# Patient Record
Sex: Male | Born: 1945 | Race: White | Hispanic: No | Smoking: Former smoker
Health system: Southern US, Community
[De-identification: ages and names within clinical notes are randomized; demographics above are authoritative.]

## PROBLEM LIST (undated history)

## (undated) DIAGNOSIS — Z951 Presence of aortocoronary bypass graft: Secondary | ICD-10-CM

## (undated) DIAGNOSIS — M1990 Unspecified osteoarthritis, unspecified site: Secondary | ICD-10-CM

## (undated) DIAGNOSIS — R350 Frequency of micturition: Secondary | ICD-10-CM

## (undated) DIAGNOSIS — I509 Heart failure, unspecified: Secondary | ICD-10-CM

## (undated) DIAGNOSIS — I519 Heart disease, unspecified: Secondary | ICD-10-CM

## (undated) DIAGNOSIS — I4891 Unspecified atrial fibrillation: Secondary | ICD-10-CM

## (undated) DIAGNOSIS — Z9289 Personal history of other medical treatment: Secondary | ICD-10-CM

## (undated) DIAGNOSIS — M48061 Spinal stenosis, lumbar region without neurogenic claudication: Secondary | ICD-10-CM

## (undated) DIAGNOSIS — G4730 Sleep apnea, unspecified: Secondary | ICD-10-CM

## (undated) DIAGNOSIS — E7800 Pure hypercholesterolemia, unspecified: Secondary | ICD-10-CM

## (undated) DIAGNOSIS — N400 Enlarged prostate without lower urinary tract symptoms: Secondary | ICD-10-CM

## (undated) DIAGNOSIS — I2510 Atherosclerotic heart disease of native coronary artery without angina pectoris: Secondary | ICD-10-CM

## (undated) DIAGNOSIS — Z95810 Presence of automatic (implantable) cardiac defibrillator: Secondary | ICD-10-CM

## (undated) DIAGNOSIS — R351 Nocturia: Secondary | ICD-10-CM

## (undated) DIAGNOSIS — I255 Ischemic cardiomyopathy: Secondary | ICD-10-CM

## (undated) DIAGNOSIS — I4510 Unspecified right bundle-branch block: Secondary | ICD-10-CM

## (undated) DIAGNOSIS — M5126 Other intervertebral disc displacement, lumbar region: Secondary | ICD-10-CM

## (undated) DIAGNOSIS — I639 Cerebral infarction, unspecified: Secondary | ICD-10-CM

## (undated) DIAGNOSIS — I10 Essential (primary) hypertension: Secondary | ICD-10-CM

## (undated) DIAGNOSIS — R011 Cardiac murmur, unspecified: Secondary | ICD-10-CM

## (undated) HISTORY — DX: Presence of aortocoronary bypass graft: Z95.1

## (undated) HISTORY — DX: Personal history of other medical treatment: Z92.89

## (undated) HISTORY — DX: Heart failure, unspecified: I50.9

## (undated) HISTORY — DX: Atherosclerotic heart disease of native coronary artery without angina pectoris: I25.10

## (undated) HISTORY — PX: OTHER SURGICAL HISTORY: SHX169

## (undated) HISTORY — DX: Benign prostatic hyperplasia without lower urinary tract symptoms: N40.0

## (undated) HISTORY — DX: Unspecified atrial fibrillation: I48.91

## (undated) HISTORY — DX: Ischemic cardiomyopathy: I25.5

## (undated) HISTORY — DX: Cardiac murmur, unspecified: R01.1

## (undated) HISTORY — DX: Heart disease, unspecified: I51.9

---

## 2010-09-28 DIAGNOSIS — Z9289 Personal history of other medical treatment: Secondary | ICD-10-CM

## 2010-09-28 HISTORY — DX: Personal history of other medical treatment: Z92.89

## 2010-10-09 DIAGNOSIS — R0602 Shortness of breath: Secondary | ICD-10-CM

## 2010-10-27 ENCOUNTER — Ambulatory Visit: Payer: Self-pay | Admitting: Cardiology

## 2010-11-25 ENCOUNTER — Ambulatory Visit: Payer: Self-pay | Admitting: Cardiology

## 2011-01-28 ENCOUNTER — Ambulatory Visit (HOSPITAL_COMMUNITY)
Admission: RE | Admit: 2011-01-28 | Discharge: 2011-01-28 | Disposition: A | Discharge status: Home / Self Care | Payer: Medicare Other | Source: Ambulatory Visit | Attending: Cardiovascular Disease | Admitting: Cardiovascular Disease

## 2011-01-28 ENCOUNTER — Other Ambulatory Visit (HOSPITAL_COMMUNITY): Payer: Self-pay | Admitting: Cardiovascular Disease

## 2011-01-28 DIAGNOSIS — Z01810 Encounter for preprocedural cardiovascular examination: Secondary | ICD-10-CM

## 2011-01-28 DIAGNOSIS — Z01818 Encounter for other preprocedural examination: Secondary | ICD-10-CM | POA: Insufficient documentation

## 2011-01-28 HISTORY — PX: CARDIAC CATHETERIZATION: SHX172

## 2011-02-04 ENCOUNTER — Ambulatory Visit (HOSPITAL_COMMUNITY)
Admission: RE | Admit: 2011-02-04 | Discharge: 2011-02-04 | Disposition: A | Discharge status: Home / Self Care | Payer: Medicare Other | Source: Ambulatory Visit | Attending: Cardiovascular Disease | Admitting: Cardiovascular Disease

## 2011-02-04 DIAGNOSIS — I2510 Atherosclerotic heart disease of native coronary artery without angina pectoris: Secondary | ICD-10-CM | POA: Insufficient documentation

## 2011-02-04 DIAGNOSIS — I4510 Unspecified right bundle-branch block: Secondary | ICD-10-CM | POA: Insufficient documentation

## 2011-02-04 DIAGNOSIS — I509 Heart failure, unspecified: Secondary | ICD-10-CM | POA: Insufficient documentation

## 2011-02-04 DIAGNOSIS — I2589 Other forms of chronic ischemic heart disease: Secondary | ICD-10-CM | POA: Insufficient documentation

## 2011-02-04 DIAGNOSIS — G4733 Obstructive sleep apnea (adult) (pediatric): Secondary | ICD-10-CM | POA: Insufficient documentation

## 2011-02-04 DIAGNOSIS — R9439 Abnormal result of other cardiovascular function study: Secondary | ICD-10-CM | POA: Insufficient documentation

## 2011-02-04 LAB — POCT I-STAT 3, VENOUS BLOOD GAS (G3P V)
Bicarbonate: 24.3 mEq/L — ABNORMAL HIGH (ref 20.0–24.0)
TCO2: 26 mmol/L (ref 0–100)
pCO2, Ven: 45.5 mmHg (ref 45.0–50.0)
pH, Ven: 7.336 — ABNORMAL HIGH (ref 7.250–7.300)

## 2011-02-04 LAB — POCT I-STAT 3, ART BLOOD GAS (G3+)
Acid-Base Excess: 1 mmol/L (ref 0.0–2.0)
Bicarbonate: 26.1 mEq/L — ABNORMAL HIGH (ref 20.0–24.0)
O2 Saturation: 96 %
pO2, Arterial: 84 mmHg (ref 80.0–100.0)

## 2011-02-09 NOTE — Cardiovascular Report (Signed)
NAMEMAYFIELD, SCHOENE NO.:  [id number]  MEDICAL RECORD NO.:  [id number]  LOCATION:  [facility]                         FACILITY:  [facility]  PHYSICIAN:  Nicki Guadalajara, M.D.     DATE OF BIRTH:  1946-04-28  DATE OF PROCEDURE:  02/04/2011 DATE OF DISCHARGE:  02/04/2011                           CARDIAC CATHETERIZATION   PROCEDURE:  Right and left heart cardiac catheterization.  OPERATOR:  Nicki Guadalajara, MD  INDICATIONS:  Mr. Jamaurion Slemmer is a 65 year old gentleman who has a history of a cardiomyopathy which has been presumed to be nonischemic in etiology.  He has a history of congestive heart failure in April 2012, history of right bundle-branch block.  He also was diagnosed as having obstructive sleep apnea and has initiated CPAP therapy.  A nuclear perfusion study had shown a dilated left ventricle with mild defect in the apical septal, apical inferior and apical lateral region without significant ischemia.  Ejection fraction was 35%.  The patient has been on medical therapy with ACE inhibition, as well as carvedilol and recently spironolactone was added to his medical regimen.  He is now referred for definitive right and left heart cardiac catheterization by Dr. Alanda Amass.  PROCEDURE IN DETAIL:  The patient was prepped and draped in the usual fashion.  His right femoral artery and right femoral vein were punctured anteriorly and a 5-French arterial sheath and 7-French venous sheaths were inserted without difficulty.  Swan-Ganz catheter was inserted into the venous sheath and advanced to the RA, RV, PA ,and PC positions under fluoroscopic guidance with hemodynamic monitoring.  O2 saturation was obtained in the PA.  A pigtail catheter was advanced to the central aorta and simultaneous AO and PA pressures were recorded.  O2 saturation was obtained in the aorta for Fick cardiac output determination. Cardiac output was also determined by the thermodilution method.   The pigtail catheter was advanced into the LV and left ventriculography was performed.  LV PC pressures were recorded.  An LV AO pullback was performed and distal aortography was done to evaluate aortoiliac disease.  Attention was then directed at the coronary arteries.  A 5- [place] FL-4 diagnostic catheter was inserted for selective angiography in the left coronary system.  IC nitroglycerin was administered selectively into the left coronary system to further evaluate the LAD stenosis.  A 5-French JR-4 catheter was used for selective angiography into the right coronary artery.  The patient tolerated the procedure well.  Hemostasis was obtained by direct manual pressure.  He returned to his room in satisfactory condition.  HEMODYNAMIC DATA:  Right atrial mean pressure 8, A-wave 12.  Right ventricular pressure 48/8.  Pulmonary pressure 48/22, mean pulmonary capillary wedge pressure 16.  Central aortic pressure 159/73.  Left ventricular pressure 159/13, post A-wave 24.  O2 saturation 96%, PA saturation 71%.  Cardiac output by the thermodilution method was 5.9 and by the Fick method was 6.6 L/min. Cardiac index was 2.5 and 2.9 L/min/m2 respectively.  Left ventriculography:  RAO ventriculography revealed dilated left ventricle with severe global hypocontractility.  Ejection fraction is in the 30-35% range.  Distal aortography did not demonstrate any renal artery stenosis.  There was mild irregularity  of the aorta without significant stenoses.  Fluoroscopy of the coronary arteries revealed coronary calcification.  The left main coronary artery bifurcated into the LAD and left circumflex system.  The LAD had evidence for coronary calcification.  After the first diagonal vessel, the LAD was diffusely narrowed of approximately 50-60% diffusely, although the RAO cranial view suggested perhaps this was tighter.  In the mid distal LAD, there was focal 80% stenosis.  These lesions did  improve slightly following IC nitroglycerin administration.  The circumflex vessel had smooth ostial narrowing of 20-30%.  There was mild luminal irregularity in the mid AV groove circumflex of 20%.  The right coronary artery was a large dominant vessel that was tortuous. There was up to 70% smooth stenosis after the proximal bend before the secondary proximal bend.  There was 99% stenosis at the ostium of a small anterior RV marginal branch.  There was 30% mid RCA stenosis followed by 50% stenosis before the acute margin and then 50-60% stenosis before the PDA takeoff in the distal RCA.  There was 70% stenosis in the proximal portion of the PLA vessel.  IMPRESSION: 1. Ischemic cardiomyopathy with an ejection fraction of 30-35% with     significant global left ventricular dysfunction. 2. Multivessel coronary artery disease with evidence for coronary     calcification, diffuse 50-60% left anterior descending stenosis in     the mid segment with 80% focal mid distal stenosis, improved     slightly following intracoronary nitroglycerin administration; 20-     30% ostial circumflex stenosis with 20% narrowing in the proximal     circumflex; and diffusely diseased right coronary artery with     smooth 70% stenosis proximally, 99% stenosis in the anterior right     ventricular marginal branch, 30-50% mid right coronary artery     stenosis before the acute margin with 50-60% stenosis after the     crux, and prior to posterior descending artery takeoff with 70%     posterolateral stenosis.  RECOMMENDATIONS:  Mr. Arguijo angiographic findings will be reviewed with Dr. Alanda Amass.  Initially, increased medical therapy will be recommended with increased carvedilol, addition of nitrate therapy, and further titration of aldosterone blockade.  It is felt that the patient most likely has an ischemic cardiomyopathy rather than nonischemic in etiology.  The patient will return to Dr. Alanda Amass for  further additional management and consideration for possible intervention/revascularization.          ______________________________ Nicki Guadalajara, M.D.     TK/MEDQ  D:  02/04/2011  T:  02/04/2011  Job:  [id number]  cc:   Gerlene Burdock A. Alanda Amass, M.D. Fara Chute, MD  Electronically Signed by Nicki Guadalajara M.D. on 02/09/2011 11:19:02 AM

## 2011-06-02 ENCOUNTER — Emergency Department (HOSPITAL_COMMUNITY): Payer: Medicare Other

## 2011-06-02 ENCOUNTER — Other Ambulatory Visit: Payer: Self-pay

## 2011-06-02 ENCOUNTER — Encounter: Payer: Self-pay | Admitting: *Deleted

## 2011-06-02 ENCOUNTER — Inpatient Hospital Stay (HOSPITAL_COMMUNITY)
Admission: EM | Admit: 2011-06-02 | Discharge: 2011-06-03 | DRG: 312 | Disposition: A | Discharge status: Home / Self Care | Payer: Medicare Other | Attending: Internal Medicine | Admitting: Internal Medicine

## 2011-06-02 DIAGNOSIS — I255 Ischemic cardiomyopathy: Secondary | ICD-10-CM

## 2011-06-02 DIAGNOSIS — I951 Orthostatic hypotension: Principal | ICD-10-CM

## 2011-06-02 DIAGNOSIS — E785 Hyperlipidemia, unspecified: Secondary | ICD-10-CM | POA: Diagnosis present

## 2011-06-02 DIAGNOSIS — I2589 Other forms of chronic ischemic heart disease: Secondary | ICD-10-CM | POA: Diagnosis present

## 2011-06-02 DIAGNOSIS — R55 Syncope and collapse: Secondary | ICD-10-CM

## 2011-06-02 DIAGNOSIS — E119 Type 2 diabetes mellitus without complications: Secondary | ICD-10-CM | POA: Diagnosis present

## 2011-06-02 DIAGNOSIS — I10 Essential (primary) hypertension: Secondary | ICD-10-CM | POA: Diagnosis present

## 2011-06-02 DIAGNOSIS — G4733 Obstructive sleep apnea (adult) (pediatric): Secondary | ICD-10-CM | POA: Diagnosis present

## 2011-06-02 DIAGNOSIS — E860 Dehydration: Secondary | ICD-10-CM

## 2011-06-02 DIAGNOSIS — I2510 Atherosclerotic heart disease of native coronary artery without angina pectoris: Secondary | ICD-10-CM | POA: Diagnosis present

## 2011-06-02 DIAGNOSIS — IMO0001 Reserved for inherently not codable concepts without codable children: Secondary | ICD-10-CM | POA: Diagnosis present

## 2011-06-02 HISTORY — DX: Atherosclerotic heart disease of native coronary artery without angina pectoris: I25.10

## 2011-06-02 HISTORY — DX: Sleep apnea, unspecified: G47.30

## 2011-06-02 HISTORY — DX: Essential (primary) hypertension: I10

## 2011-06-02 HISTORY — DX: Pure hypercholesterolemia, unspecified: E78.00

## 2011-06-02 HISTORY — DX: Unspecified right bundle-branch block: I45.10

## 2011-06-02 LAB — BASIC METABOLIC PANEL
CO2: 26 mEq/L (ref 19–32)
Calcium: 8.8 mg/dL (ref 8.4–10.5)
Creatinine, Ser: 0.95 mg/dL (ref 0.50–1.35)
Glucose, Bld: 297 mg/dL — ABNORMAL HIGH (ref 70–99)

## 2011-06-02 LAB — URINALYSIS, ROUTINE W REFLEX MICROSCOPIC
Bilirubin Urine: NEGATIVE
Nitrite: NEGATIVE
Protein, ur: NEGATIVE mg/dL
Specific Gravity, Urine: 1.025 (ref 1.005–1.030)
Urobilinogen, UA: 0.2 mg/dL (ref 0.0–1.0)

## 2011-06-02 LAB — CBC
HCT: 35.3 % — ABNORMAL LOW (ref 39.0–52.0)
Hemoglobin: 12.8 g/dL — ABNORMAL LOW (ref 13.0–17.0)
MCH: 30.4 pg (ref 26.0–34.0)
MCV: 83.8 fL (ref 78.0–100.0)
RBC: 4.21 MIL/uL — ABNORMAL LOW (ref 4.22–5.81)

## 2011-06-02 LAB — POCT I-STAT TROPONIN I: Troponin i, poc: 0 ng/mL (ref 0.00–0.08)

## 2011-06-02 LAB — GLUCOSE, CAPILLARY

## 2011-06-02 MED ORDER — SODIUM CHLORIDE 0.9 % IV SOLN
INTRAVENOUS | Status: DC
  Administered 2011-06-02 – 2011-06-03 (×2): via INTRAVENOUS

## 2011-06-02 MED ORDER — CARVEDILOL 12.5 MG PO TABS
12.5000 mg | ORAL_TABLET | Freq: Every day | ORAL | Status: DC
  Filled 2011-06-02: qty 1

## 2011-06-02 MED ORDER — INSULIN ASPART 100 UNIT/ML ~~LOC~~ SOLN
0.0000 [IU] | Freq: Three times a day (TID) | SUBCUTANEOUS | Status: DC
  Administered 2011-06-03 (×2): 3 [IU] via SUBCUTANEOUS

## 2011-06-02 MED ORDER — CARVEDILOL 3.125 MG PO TABS: 6.2500 mg | ORAL_TABLET | Freq: Every day | ORAL | Status: DC

## 2011-06-02 MED ORDER — RAMIPRIL 2.5 MG PO CAPS: 5.0000 mg | ORAL_CAPSULE | Freq: Every day | ORAL | Status: DC

## 2011-06-02 MED ORDER — ONDANSETRON HCL 4 MG PO TABS: 4.0000 mg | ORAL_TABLET | Freq: Four times a day (QID) | ORAL | Status: DC | PRN

## 2011-06-02 MED ORDER — SODIUM CHLORIDE 0.9 % IJ SOLN
INTRAMUSCULAR | Status: AC
  Filled 2011-06-02: qty 3

## 2011-06-02 MED ORDER — ISOSORBIDE MONONITRATE ER 60 MG PO TB24
60.0000 mg | ORAL_TABLET | Freq: Every day | ORAL | Status: DC
  Administered 2011-06-02: 60 mg via ORAL
  Filled 2011-06-02: qty 1

## 2011-06-02 MED ORDER — DOXAZOSIN MESYLATE 2 MG PO TABS
4.0000 mg | ORAL_TABLET | Freq: Every day | ORAL | Status: DC
  Administered 2011-06-02: 4 mg via ORAL
  Filled 2011-06-02: qty 2

## 2011-06-02 MED ORDER — INSULIN ASPART 100 UNIT/ML ~~LOC~~ SOLN
0.0000 [IU] | Freq: Every day | SUBCUTANEOUS | Status: DC
  Administered 2011-06-02: 3 [IU] via SUBCUTANEOUS
  Filled 2011-06-02: qty 3

## 2011-06-02 MED ORDER — ACETAMINOPHEN 650 MG RE SUPP: 650.0000 mg | Freq: Four times a day (QID) | RECTAL | Status: DC | PRN

## 2011-06-02 MED ORDER — SPIRONOLACTONE 25 MG PO TABS
12.5000 mg | ORAL_TABLET | Freq: Two times a day (BID) | ORAL | Status: DC
Start: 2011-06-02 — End: 2011-06-03
  Administered 2011-06-02 – 2011-06-03 (×2): 12.5 mg via ORAL
  Filled 2011-06-02 (×2): qty 1

## 2011-06-02 MED ORDER — GLIPIZIDE ER 10 MG PO TB24
10.0000 mg | ORAL_TABLET | Freq: Every day | ORAL | Status: DC
  Administered 2011-06-03: 10 mg via ORAL
  Filled 2011-06-02: qty 1

## 2011-06-02 MED ORDER — ONDANSETRON HCL 4 MG/2ML IJ SOLN: 4.0000 mg | Freq: Four times a day (QID) | INTRAMUSCULAR | Status: DC | PRN

## 2011-06-02 MED ORDER — SODIUM CHLORIDE 0.9 % IJ SOLN
INTRAMUSCULAR | Status: AC
  Administered 2011-06-02: 20:00:00
  Filled 2011-06-02: qty 3

## 2011-06-02 MED ORDER — ROSUVASTATIN CALCIUM 20 MG PO TABS
40.0000 mg | ORAL_TABLET | Freq: Every day | ORAL | Status: DC
  Administered 2011-06-02: 40 mg via ORAL
  Filled 2011-06-02: qty 2

## 2011-06-02 MED ORDER — ZOLPIDEM TARTRATE 5 MG PO TABS
5.0000 mg | ORAL_TABLET | Freq: Every evening | ORAL | Status: DC | PRN
  Administered 2011-06-02: 5 mg via ORAL
  Filled 2011-06-02: qty 1

## 2011-06-02 MED ORDER — ACETAMINOPHEN 325 MG PO TABS
650.0000 mg | ORAL_TABLET | Freq: Four times a day (QID) | ORAL | Status: DC | PRN
  Administered 2011-06-02: 650 mg via ORAL
  Filled 2011-06-02: qty 2

## 2011-06-02 MED ORDER — HEPARIN SODIUM (PORCINE) 5000 UNIT/ML IJ SOLN
5000.0000 [IU] | Freq: Three times a day (TID) | INTRAMUSCULAR | Status: DC
  Administered 2011-06-02 – 2011-06-03 (×2): 5000 [IU] via SUBCUTANEOUS
  Filled 2011-06-02 (×2): qty 1

## 2011-06-02 MED ORDER — ASPIRIN EC 81 MG PO TBEC
81.0000 mg | DELAYED_RELEASE_TABLET | Freq: Every day | ORAL | Status: DC
  Administered 2011-06-03: 81 mg via ORAL
  Filled 2011-06-02: qty 1

## 2011-06-02 MED ORDER — SODIUM CHLORIDE 0.9 % IV BOLUS (SEPSIS): 250.0000 mL | Freq: Once | INTRAVENOUS | Status: DC

## 2011-06-02 NOTE — ED Notes (Signed)
Attempted to call report to nurse, nurse off floor and staff to inform nurse to call back when able to take report

## 2011-06-02 NOTE — H&P (Signed)
[name] [name] MRN: [id number] DOB/AGE: May 15, 1946 65 y.o. Primary Care Physician:[name],PAUL W, MD Admit date: 06/02/2011 Chief Complaint: Weakness with 10 second episode of syncope. HPI: This very pleasant 65 year old man, who has a history of ischemic cardiomyopathy with an  ejection fraction of 30-35% presents with a one-week history of feeling weak and somewhat lightheaded. He had gone to see his cardiologist today to noted that he had postural hypotension. He sent him for lab work. When he got to the lab, whilst he was standing up, he's felt dizzy and fell, losing consciousness. Fortunately, he lost consciousness for only a matter of 10-20 seconds. He was then fully awake. There is no history of seizure activity, abnormal color to his skin. When he was fully awake, he was also fully alert and orientated. He does not describe any chest pain, palpitations prior to the event nor has he had any recently in the last week or so. Interestingly, he does describe a trip to [place] for 5 days where he does admit that he walked for many hours a day.    Past medical history: 1. Type 2 diabetes mellitus, uncontrolled with most recent hemoglobin A1c 8.2%. 2. Ischemic cardiomyopathy, ejection fraction 30-35%. 3. Hyperlipidemia. 4. Hypertension. 5. Cerebrovascular disease with history of stroke. 6. History of right bundle branch block. 7. Sleep apnea.     History reviewed. No pertinent family history. Family history: Noncontributory.   Social history: He lives with his girlfriend. He does not smoke cigarettes. He occasionally drinks alcohol. He works as a Tree surgeon.  Allergies: No Known Allergies  Medications Prior to Admission  Medication Dose Route Frequency Provider Last Rate Last Dose  . 0.9 %  sodium chloride infusion   Intravenous Continuous Laray Anger, DO      . sodium chloride 0.9 % bolus 250 mL  250 mL Intravenous Once Laray Anger, DO       No current  outpatient prescriptions on file as of 06/02/2011.       WUJ:WJXBJ from the symptoms mentioned above,there are no other symptoms referable to all systems reviewed.  Physical Exam: Blood pressure 127/56, pulse 72, temperature 98 F (36.7 C), temperature source Oral, resp. rate 18, height 6\' 2"  (1.88 m), weight 104.327 kg (230 lb), SpO2 100.00%. He does actually looks systemically well at rest. He is alert and orientated. He does have orthostatic hypotension. Cardiovascular: Heart sounds are present and normal without murmurs. There is no gallop rhythm. Jugular venous pressure not raised. Respiratory: Lung fields are clear. Abdomen: Soft, nontender, no masses, no hepatosplenomegaly. Neurological: Alert and orientated without any focal neurological signs.    Basename 06/02/11 1412  WBC 3.5*  NEUTROABS --  HGB 12.8*  HCT 35.3*  MCV 83.8  PLT 160    Basename 06/02/11 1412  NA 132*  K 4.0  CL 95*  CO2 26  GLUCOSE 297*  BUN 26*  CREATININE 0.95  CALCIUM 8.8  MG --         Dg Chest 2 View  06/02/2011  *RADIOLOGY REPORT*  Clinical Data: Cough.  History of hypertension.  Former smoker.  CHEST - 2 VIEW 06/02/2011:  Comparison: Two-view chest x-ray 01/28/2011 [facility].  Findings: Cardiac silhouette normal in size.  Thoracic aorta atherosclerotic, unchanged.  Hilar and mediastinal contours otherwise unremarkable.  Lungs clear.  No pleural effusions. Degenerative changes involving the thoracic spine.  IMPRESSION: No acute cardiopulmonary disease.  Original Report Authenticated By: Arnell Sieving, M.D.   Ct Head  Wo Contrast  06/02/2011  *RADIOLOGY REPORT*  Clinical Data: Dizziness.  Loss of consciousness.  History of diabetes and hypertension.  CT HEAD WITHOUT CONTRAST 06/02/2011:  Technique:  Contiguous axial images were obtained from the base of the skull through the vertex without contrast.  Comparison: None.  Findings: Encephalomalacia in the inferior left cerebellar  hemisphere consistent with old stroke.  Old lacunar stroke in the inferior left basal ganglia.  Ventricular system normal in size and appearance for age.  No mass lesion.  No midline shift.  No acute hemorrhage or hematoma.  No extra-axial fluid collections.  No evidence of acute infarction.  No focal osseous abnormality involving the skull.  Visualized paranasal sinuses, mastoid air cells, and middle ear cavities well- aerated.  Mild bilateral carotid siphon and left vertebral artery atherosclerosis.  IMPRESSION:  1.  No acute intracranial abnormality. 2.  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 3.  Old lacunar stroke in the inferior left basal ganglia.  Original Report Authenticated By: Arnell Sieving, M.D.   Impression: 1. Dehydration/hypovolemia. 2. History of ischemic cardiomyopathy., Clinically not in heart failure. 3. Uncontrolled type 2 diabetes mellitus. 4. Hypertension currently with orthostatic hypotension. 5. Hyperlipidemia.     Plan: 1.  Admit to telemetry. 2. Gentle intravenous fluids. 3. Hold Lasix. Continue his prophylactic for the time being. Continue with all other medications. 4. Sliding scale of insulin for diabetic control. Continue with oral hypoglycemic agents except for metformin. 5. Serial cardiac enzymes and repeat ECG in the morning. 6. Cardiology consultation. I've spoken to Dr. [name], who will make sure he is seen tomorrow.      Taytem Ghattas C 06/02/2011, 5:08 PM

## 2011-06-02 NOTE — ED Notes (Signed)
Patient with LOC today, witnessed by daughter and had eased pt to floor, pt states that recently with weakness and dizziness, lost 11 lbs. In 2 weeks per PCP

## 2011-06-02 NOTE — ED Provider Notes (Signed)
History     CSN: [id number] Arrival date & time: 06/02/2011  1:50 PM     Chief Complaint  Patient presents with  . Loss of Consciousness    HPI Pt was seen at 1425.  Per pt and family, c/o gradual onset and worsening of persistent generalized weakness and fatigue x2 weeks.  Describes his symptoms as "just feeling tired."  Endorses poor PO intake.  Pt was eval by his Cards MD today for same, noted "low BP" in ofce.  Pt was sent to lab for blood work where pt had one brief syncopal episode while sitting in their waiting area.  Pt states he "just felt weak and lightheaded" before syncopal episode.  Family witnessed episode.  No reported seizure activity, no incont of bowel/bladder, no confusion after event.  Pt denies CP/palpitations, no SOB/cough, no back pain, no abd pain, no N/V/D, no fevers, no rash, no focal motor weakness, no tingling/numbness in extremities.    Past Medical History  Diagnosis Date  . Diabetes mellitus   . Coronary artery disease   . High cholesterol   . Hypertension   . Stroke, Wallenberg's syndrome   . Right bundle branch block   . Cardiomyopathy   . Sleep apnea     Past Surgical History  Procedure Date  . Cardiac catheterization     History  Substance Use Topics  . Smoking status: Former Games developer  . Smokeless tobacco: Not on file  . Alcohol Use: Yes     occ. use    Review of Systems ROS: Statement: All systems negative except as marked or noted in the HPI; Constitutional: Negative for fever and chills. +generalized weakness/fatigue ; Eyes: Negative for eye pain, redness and discharge. ; ; ENMT: Negative for ear pain, hoarseness, nasal congestion, sinus pressure and sore throat. ; ; Cardiovascular: Negative for chest pain, palpitations, diaphoresis, dyspnea and peripheral edema. ; ; Respiratory: Negative for cough, wheezing and stridor. ; ; Gastrointestinal: Negative for nausea, vomiting, diarrhea, abdominal pain, blood in stool, hematemesis, jaundice and  rectal bleeding. . ; ; Genitourinary: Negative for dysuria, flank pain and hematuria. ; ; Musculoskeletal: Negative for back pain and neck pain. Negative for swelling and trauma.; ; Skin: Negative for pruritus, rash, abrasions, blisters, bruising and skin lesion.; ; Neuro: Negative for headache, lightheadedness and neck stiffness. Negative for altered level of consciousness , altered mental status, extremity weakness, paresthesias, involuntary movement, seizure and +syncope.     Allergies  Review of patient's allergies indicates no known allergies.  Home Medications   Current Outpatient Rx  Name Route Sig Dispense Refill  . ASPIRIN EC 81 MG PO TBEC Oral Take 81 mg by mouth daily.      Marland Kitchen CARVEDILOL 6.25 MG PO TABS Oral Take 6.25-12.5 mg by mouth 2 (two) times daily. Take 2 tablets in the morning and one in the evening.     Marland Kitchen DOXAZOSIN MESYLATE 4 MG PO TABS Oral Take 4 mg by mouth at bedtime.      . FUROSEMIDE 20 MG PO TABS Oral Take 40 mg by mouth daily.      Marland Kitchen GLIPIZIDE ER 10 MG PO TB24 Oral Take 10 mg by mouth daily.      . IBUPROFEN 200 MG PO TABS Oral Take 400 mg by mouth every 6 (six) hours as needed. Pain     . ISOSORBIDE MONONITRATE ER 60 MG PO TB24 Oral Take 60 mg by mouth at bedtime.      Marland Kitchen METFORMIN HCL  ER (MOD) 500 MG PO TB24 Oral Take 500 mg by mouth 2 (two) times daily.      . ONE-A-DAY 50 PLUS PO Oral Take 1 tablet by mouth daily.      Marland Kitchen RAMIPRIL 5 MG PO CAPS Oral Take 5 mg by mouth at bedtime.      Marland Kitchen ROSUVASTATIN CALCIUM 40 MG PO TABS Oral Take 40 mg by mouth at bedtime.      . SPIRONOLACTONE 25 MG PO TABS Oral Take 12.5 mg by mouth 2 (two) times daily.        BP 87/50  Pulse 72  Temp(Src) 98.3 F (36.8 C) (Oral)  Resp 16  Ht 6\' 2"  (1.88 m)  Wt 230 lb (104.327 kg)  BMI 29.53 kg/m2  SpO2 99%  Physical Exam 1430: Physical examination:  Nursing notes reviewed; Vital signs and O2 SAT reviewed;  Constitutional: Well developed, Well nourished, Well hydrated, In no acute  distress; Head:  Normocephalic, atraumatic; Eyes: EOMI, PERRL, No scleral icterus; ENMT: Mouth and pharynx normal, Mucous membranes moist; Neck: Supple, Full range of motion, No lymphadenopathy; Cardiovascular: Regular rate and rhythm, No murmur, rub, or gallop; Respiratory: Breath sounds clear & equal bilaterally, No rales, rhonchi, wheezes, or rub, Normal respiratory effort/excursion; Chest: Nontender, Movement normal; Abdomen: Soft, Nontender, Nondistended, Normal bowel sounds; Extremities: Pulses normal, No tenderness, No edema, No calf edema or asymmetry.; Neuro: AA&Ox3, Major CN grossly intact. No facial droop, speech clear.  Equal grips, strength 5/5 equal bilat UE's and LE's.  No gross focal motor or sensory deficits in extremities.; Skin: Color normal, Warm, Dry, no rash, no petechiae.    ED Course  Procedures   1515:  T/C from [facility] Dr. Alanda Amass, states pt was in his ofc today, SBP 80's and 90's while sitting.  Pt's c/o in his ofc also was "weak and tired" for the past 2 weeks, no specific complaints.  He cut pt's BP meds doses today.  ED EKG today with new T-wave changes described to Cards MD, states pt has had those changes on previous EKG's.  He will fax over ofc info.  Pt will need tx for his hypotension with meds adjustment and judicious IVF with close monitoring given hx of cardiomyopathy.   Hospitalist please admit, he can consult prn.    MDM  MDM Reviewed: nursing note and vitals Reviewed previous: ECG Interpretation: ECG, labs, x-ray and CT scan    Date: 06/02/2011  Rate: 70  Rhythm: normal sinus rhythm  QRS Axis: left  Intervals: normal  ST/T Wave abnormalities: nonspecific ST/T changes  Conduction Disutrbances:right bundle branch block and left anterior fascicular block  Narrative Interpretation:   Old EKG Reviewed: changes noted; new flipped T-waves anterior-inferior leads compared to EKG from Cards ofc today.  Results for orders placed during the hospital encounter of  06/02/11  CBC      Component Value Range   WBC 3.5 (*) 4.0 - 10.5 (K/uL)   RBC 4.21 (*) 4.22 - 5.81 (MIL/uL)   Hemoglobin 12.8 (*) 13.0 - 17.0 (g/dL)   HCT 16.1 (*) 09.6 - 52.0 (%)   MCV 83.8  78.0 - 100.0 (fL)   MCH 30.4  26.0 - 34.0 (pg)   MCHC 36.3 (*) 30.0 - 36.0 (g/dL)   RDW 04.5  40.9 - 81.1 (%)   Platelets 160  150 - 400 (K/uL)  BASIC METABOLIC PANEL      Component Value Range   Sodium 132 (*) 135 - 145 (mEq/L)   Potassium  4.0  3.5 - 5.1 (mEq/L)   Chloride 95 (*) 96 - 112 (mEq/L)   CO2 26  19 - 32 (mEq/L)   Glucose, Bld 297 (*) 70 - 99 (mg/dL)   BUN 26 (*) 6 - 23 (mg/dL)   Creatinine, Ser 5.62  0.50 - 1.35 (mg/dL)   Calcium 8.8  8.4 - 13.0 (mg/dL)   GFR calc non Af Amer 84 (*) >90 (mL/min)   GFR calc Af Amer >90  >90 (mL/min)  POCT I-STAT TROPONIN I      Component Value Range   Troponin i, poc 0.00  0.00 - 0.08 (ng/mL)   Comment 3           D-DIMER, QUANTITATIVE      Component Value Range   D-Dimer, Quant 0.25  0.00 - 0.48 (ug/mL-FEU)   Dg Chest 2 View  06/02/2011  *RADIOLOGY REPORT*  Clinical Data: Cough.  History of hypertension.  Former smoker.  CHEST - 2 VIEW 06/02/2011:  Comparison: Two-view chest x-ray 01/28/2011 [facility].  Findings: Cardiac silhouette normal in size.  Thoracic aorta atherosclerotic, unchanged.  Hilar and mediastinal contours otherwise unremarkable.  Lungs clear.  No pleural effusions. Degenerative changes involving the thoracic spine.  IMPRESSION: No acute cardiopulmonary disease.  Original Report Authenticated By: Arnell Sieving, M.D.   Ct Head Wo Contrast  06/02/2011  *RADIOLOGY REPORT*  Clinical Data: Dizziness.  Loss of consciousness.  History of diabetes and hypertension.  CT HEAD WITHOUT CONTRAST 06/02/2011:  Technique:  Contiguous axial images were obtained from the base of the skull through the vertex without contrast.  Comparison: None.  Findings: Encephalomalacia in the inferior left cerebellar hemisphere consistent with  old stroke.  Old lacunar stroke in the inferior left basal ganglia.  Ventricular system normal in size and appearance for age.  No mass lesion.  No midline shift.  No acute hemorrhage or hematoma.  No extra-axial fluid collections.  No evidence of acute infarction.  No focal osseous abnormality involving the skull.  Visualized paranasal sinuses, mastoid air cells, and middle ear cavities well- aerated.  Mild bilateral carotid siphon and left vertebral artery atherosclerosis.  IMPRESSION:  1.  No acute intracranial abnormality. 2.  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 3.  Old lacunar stroke in the inferior left basal ganglia.  Original Report Authenticated By: Arnell Sieving, M.D.    4:07 PM:  Pt with elevated glu, but has hx of DM.  Not acidotic.  +orthostatic and symptomatic here in ED.  Will admit.  Dx testing d/w pt and family.  Questions answered.  Verb understanding, agreeable to admit.  1610:   T/C to [facility] Dr. Karilyn Cota, case discussed, including:  HPI, pertinent PM/SHx, VS/PE, dx testing, ED course and treatment.  Agreeable to admit.  Requests to obtain tele bed.     Tahjay Binion Allison Quarry, DO 06/04/11 [phone]

## 2011-06-02 NOTE — ED Notes (Signed)
Patient denies any CP or SOB, reported by [facility] with + orthostatics

## 2011-06-02 NOTE — ED Notes (Signed)
MD at bedside. 

## 2011-06-02 NOTE — ED Notes (Signed)
Patient states that he was seen by Dr. Jerilynn Birkenhead today, had an EKG done and lab work done as well

## 2011-06-03 DIAGNOSIS — R55 Syncope and collapse: Secondary | ICD-10-CM | POA: Diagnosis present

## 2011-06-03 LAB — URINE CULTURE: Culture: NO GROWTH

## 2011-06-03 LAB — CARDIAC PANEL(CRET KIN+CKTOT+MB+TROPI)
CK, MB: 2.3 ng/mL (ref 0.3–4.0)
Relative Index: 1.2 (ref 0.0–2.5)
Relative Index: 1.3 (ref 0.0–2.5)
Troponin I: <0.3 ng/mL (ref <0.30)

## 2011-06-03 LAB — GLUCOSE, CAPILLARY
Glucose-Capillary: 163 mg/dL — ABNORMAL HIGH (ref 70–99)
Glucose-Capillary: 197 mg/dL — ABNORMAL HIGH (ref 70–99)

## 2011-06-03 LAB — CBC
HCT: 31.5 % — ABNORMAL LOW (ref 39.0–52.0)
MCV: 83.6 fL (ref 78.0–100.0)
RBC: 3.77 MIL/uL — ABNORMAL LOW (ref 4.22–5.81)
RDW: 13.2 % (ref 11.5–15.5)
WBC: 3.7 10*3/uL — ABNORMAL LOW (ref 4.0–10.5)

## 2011-06-03 LAB — HEMOGLOBIN A1C
Hgb A1c MFr Bld: 8.5 % — ABNORMAL HIGH (ref <5.7)
Mean Plasma Glucose: 197 mg/dL — ABNORMAL HIGH (ref <117)

## 2011-06-03 LAB — COMPREHENSIVE METABOLIC PANEL
ALT: 22 U/L (ref 0–53)
AST: 22 U/L (ref 0–37)
Albumin: 3.2 g/dL — ABNORMAL LOW (ref 3.5–5.2)
Alkaline Phosphatase: 77 U/L (ref 39–117)
BUN: 22 mg/dL (ref 6–23)
Potassium: 3.4 mEq/L — ABNORMAL LOW (ref 3.5–5.1)
Sodium: 134 mEq/L — ABNORMAL LOW (ref 135–145)
Total Protein: 6.6 g/dL (ref 6.0–8.3)

## 2011-06-03 MED ORDER — ISOSORBIDE MONONITRATE ER 60 MG PO TB24: 30.0000 mg | ORAL_TABLET | Freq: Every day | ORAL | Status: DC

## 2011-06-03 MED ORDER — SITAGLIPTIN PHOSPHATE 100 MG PO TABS: 100.0000 mg | ORAL_TABLET | Freq: Every day | ORAL | Status: DC

## 2011-06-03 MED ORDER — RAMIPRIL 5 MG PO CAPS: 5.0000 mg | ORAL_CAPSULE | Freq: Every day | ORAL | Status: DC

## 2011-06-03 MED ORDER — FUROSEMIDE 20 MG PO TABS: 40.0000 mg | ORAL_TABLET | Freq: Every day | ORAL | Status: DC

## 2011-06-03 MED ORDER — CARVEDILOL 6.25 MG PO TABS: 6.2500 mg | ORAL_TABLET | Freq: Two times a day (BID) | ORAL | Status: DC

## 2011-06-03 NOTE — Discharge Summary (Signed)
Patient ID: SABAN HEINLEN MRN: [id number] DOB/AGE: 09/16/1945 65 y.o.  Admit date: 06/02/2011 Discharge date: 06/03/2011  Primary Care Physician:  [name], MD  Discharge Diagnoses:    Present on Admission:  .Dehydration .Ischemic cardiomyopathy .DM (diabetes mellitus) .HTN (hypertension) .Vasovagal syncope Orthostatic Hypotension    Current Discharge Medication List    START taking these medications   Details  sitaGLIPtin (JANUVIA) 100 MG tablet Take 1 tablet (100 mg total) by mouth daily. Qty: 30 tablet, Refills: 0      CONTINUE these medications which have CHANGED   Details  carvedilol (COREG) 6.25 MG tablet Take 1 tablet (6.25 mg total) by mouth 2 (two) times daily with a meal. Take 2 tablets in the morning and one in the evening.    furosemide (LASIX) 20 MG tablet Take 2 tablets (40 mg total) by mouth daily. Resume after 3 days    isosorbide mononitrate (IMDUR) 60 MG 24 hr tablet Take 0.5 tablets (30 mg total) by mouth at bedtime.    ramipril (ALTACE) 5 MG capsule Take 1 capsule (5 mg total) by mouth at bedtime. Resume after 3 days      CONTINUE these medications which have NOT CHANGED   Details  aspirin EC 81 MG tablet Take 81 mg by mouth daily.      doxazosin (CARDURA) 4 MG tablet Take 4 mg by mouth at bedtime.      glipiZIDE (GLUCOTROL XL) 10 MG 24 hr tablet Take 10 mg by mouth daily.      ibuprofen (ADVIL,MOTRIN) 200 MG tablet Take 400 mg by mouth every 6 (six) hours as needed. Pain     metFORMIN (GLUMETZA) 500 MG (MOD) 24 hr tablet Take 500 mg by mouth 2 (two) times daily.      Multiple Vitamins-Minerals (ONE-A-DAY 50 PLUS PO) Take 1 tablet by mouth daily.      rosuvastatin (CRESTOR) 40 MG tablet Take 40 mg by mouth at bedtime.      spironolactone (ALDACTONE) 25 MG tablet Take 12.5 mg by mouth 2 (two) times daily.          Disposition and Follow-up:Follow up with primary doctor and cardiologist as previously scheduled.  Consults:   Cardiology, Dr. [name]  Significant Diagnostic Studies:  No results found.  Brief H and P: For complete details please refer to admission H and P, but in brief This very pleasant 65 year old man, who has a history of ischemic cardiomyopathy with an ejection fraction of 30-35% presents with a one-week history of feeling weak and somewhat lightheaded. He had gone to see his cardiologist today to noted that he had postural hypotension. He sent him for lab work. When he got to the lab, whilst he was standing up, he's felt dizzy and fell, losing consciousness. Fortunately, he lost consciousness for only a matter of 10-20 seconds. He was then fully awake. There is no history of seizure activity, abnormal color to his skin. When he was fully awake, he was also fully alert and orientated.  He does not describe any chest pain, palpitations prior to the event nor has he had any recently in the last week or so.  Interestingly, he does describe a trip to [employer] for 5 days where he does admit that he walked for many hours a day.   Physical Exam on Discharge:  Filed Vitals:   06/02/11 1714 06/02/11 1755 06/02/11 2043 06/03/11 0523  BP: 142/57 145/71 110/66 120/64  Pulse: 68 63 72 72  Temp: 97.6  F (36.4 C) 97.7 F (36.5 C) 98 F (36.7 C) 97.8 F (36.6 C)  TempSrc: Oral Oral Oral   Resp: 20 20 20 20   Height:  6\' 2"  (1.88 m)    Weight:  103.057 kg (227 lb 3.2 oz)    SpO2: 99% 97% 97% 93%     Intake/Output Summary (Last 24 hours) at 06/03/11 1341 Last data filed at 06/03/11 0600  Gross per 24 hour  Intake   2410 ml  Output    400 ml  Net   2010 ml    General: Alert, awake, oriented x3, in no acute distress. HEENT: No bruits, no goiter. Heart: Regular rate and rhythm, without murmurs, rubs, gallops. Lungs: Clear to auscultation bilaterally. Abdomen: Soft, nontender, nondistended, positive bowel sounds. Extremities: No clubbing cyanosis or edema with positive pedal pulses. Neuro:  Grossly intact, nonfocal.  CBC:    Component Value Date/Time   WBC 3.7* 06/03/2011 0400   HGB 11.5* 06/03/2011 0400   HCT 31.5* 06/03/2011 0400   PLT 160 06/03/2011 0400   MCV 83.6 06/03/2011 0400    Basic Metabolic Panel:    Component Value Date/Time   NA 134* 06/03/2011 0400   K 3.4* 06/03/2011 0400   CL 100 06/03/2011 0400   CO2 25 06/03/2011 0400   BUN 22 06/03/2011 0400   CREATININE 0.74 06/03/2011 0400   GLUCOSE 172* 06/03/2011 0400   CALCIUM 8.7 06/03/2011 0400    Hospital Course:  Patient was found to be significantly orthostatic on admission.  He was hydrated with IV fluids and this resolved his symptoms.  He is no longer dizzy on standing and is asymptomatic.  His cardiac meds were adjusted and he was seen by Dr. [name] from cardiology.  His Coreg, Imdur and Aldactone doses were decreased by half.  His lasix and altace will be held for 3 days before resuming them. Patient had negative cardiac enzymes.  It was not felt that he needed any further inpatient cardiac work up.  Further work up, in terms of his coronary disease and options for revascularization would need to be discussed with his primary cardiologist as an outpatient.  Regarding his diabetes, we will start [place] for him.  He was advised to keep a log of his blood sugars and continue to follow with his nutritionist.  He will see his primary doctor next week for further adjustments.   Time spent on Discharge:  Signed: MEMON,JEHANZEB 06/03/2011, 1:41 PM

## 2011-06-03 NOTE — Progress Notes (Signed)
Subjective: Feels significantly improved.  No chest pain, shortness of breath.  He has ambulated to the bathroom and denies any dizziness. Wants to go home.  Objective:  Vital signs in last 24 hours:  Filed Vitals:   06/02/11 1714 06/02/11 1755 06/02/11 2043 06/03/11 0523  BP: 142/57 145/71 110/66 120/64  Pulse: 68 63 72 72  Temp: 97.6 F (36.4 C) 97.7 F (36.5 C) 98 F (36.7 C) 97.8 F (36.6 C)  TempSrc: Oral Oral Oral   Resp: 20 20 20 20   Height:  6\' 2"  (1.88 m)    Weight:  103.057 kg (227 lb 3.2 oz)    SpO2: 99% 97% 97% 93%    Intake/Output from previous day:   Intake/Output Summary (Last 24 hours) at 06/03/11 0956 Last data filed at 06/03/11 0600  Gross per 24 hour  Intake   2410 ml  Output    400 ml  Net   2010 ml    Physical Exam: General: Alert, awake, oriented x3, in no acute distress. HEENT: No bruits, no goiter. Moist mucous membranes, no scleral icterus, no conjunctival pallor. Heart: Regular rate and rhythm, without murmurs, rubs, gallops. Lungs: Clear to auscultation bilaterally. No wheezing, no rhonchi, no rales.  Abdomen: Soft, nontender, nondistended, positive bowel sounds. Extremities: No clubbing cyanosis or edema,  positive pedal pulses. Neuro: Grossly intact, nonfocal.    Lab Results:  Basic Metabolic Panel:    Component Value Date/Time   NA 134* 06/03/2011 0400   K 3.4* 06/03/2011 0400   CL 100 06/03/2011 0400   CO2 25 06/03/2011 0400   BUN 22 06/03/2011 0400   CREATININE 0.74 06/03/2011 0400   GLUCOSE 172* 06/03/2011 0400   CALCIUM 8.7 06/03/2011 0400   CBC:    Component Value Date/Time   WBC 3.7* 06/03/2011 0400   HGB 11.5* 06/03/2011 0400   HCT 31.5* 06/03/2011 0400   PLT 160 06/03/2011 0400   MCV 83.6 06/03/2011 0400      Lab 06/03/11 0400 06/02/11 1412  WBC 3.7* 3.5*  HGB 11.5* 12.8*  HCT 31.5* 35.3*  PLT 160 160  MCV 83.6 83.8  MCH 30.5 30.4  MCHC 36.5* 36.3*  RDW 13.2 13.2  LYMPHSABS -- --  MONOABS -- --  EOSABS -- --    BASOSABS -- --  BANDABS -- --    Lab 06/03/11 0400 06/02/11 1412  NA 134* 132*  K 3.4* 4.0  CL 100 95*  CO2 25 26  GLUCOSE 172* 297*  BUN 22 26*  CREATININE 0.74 0.95  CALCIUM 8.7 8.8  MG -- --   No results found for this basename: INR:5,PROTIME:5 in the last 168 hours Cardiac markers:  Lab 06/03/11 0745 06/03/11 0008  CKMB 2.3 2.3  TROPONINI <0.30 <0.30  MYOGLOBIN -- --    Lab 06/03/11 0400  POCBNP 95.8   No results found for this or any previous visit (from the past 240 hour(s)).  Studies/Results: Dg Chest 2 View  06/02/2011  *RADIOLOGY REPORT*  Clinical Data: Cough.  History of hypertension.  Former smoker.  CHEST - 2 VIEW 06/02/2011:  Comparison: Two-view chest x-ray 01/28/2011 [facility].  Findings: Cardiac silhouette normal in size.  Thoracic aorta atherosclerotic, unchanged.  Hilar and mediastinal contours otherwise unremarkable.  Lungs clear.  No pleural effusions. Degenerative changes involving the thoracic spine.  IMPRESSION: No acute cardiopulmonary disease.  Original Report Authenticated By: Arnell Sieving, M.D.   Ct Head Wo Contrast  06/02/2011  *RADIOLOGY REPORT*  Clinical Data: Dizziness.  Loss of consciousness.  History of diabetes and hypertension.  CT HEAD WITHOUT CONTRAST 06/02/2011:  Technique:  Contiguous axial images were obtained from the base of the skull through the vertex without contrast.  Comparison: None.  Findings: Encephalomalacia in the inferior left cerebellar hemisphere consistent with old stroke.  Old lacunar stroke in the inferior left basal ganglia.  Ventricular system normal in size and appearance for age.  No mass lesion.  No midline shift.  No acute hemorrhage or hematoma.  No extra-axial fluid collections.  No evidence of acute infarction.  No focal osseous abnormality involving the skull.  Visualized paranasal sinuses, mastoid air cells, and middle ear cavities well- aerated.  Mild bilateral carotid siphon and left vertebral  artery atherosclerosis.  IMPRESSION:  1.  No acute intracranial abnormality. 2.  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 3.  Old lacunar stroke in the inferior left basal ganglia.  Original Report Authenticated By: Arnell Sieving, M.D.    Medications: Scheduled Meds:   . aspirin EC  81 mg Oral Daily  . carvedilol  12.5 mg Oral Q breakfast  . carvedilol  6.25 mg Oral q1800  . doxazosin  4 mg Oral QHS  . glipiZIDE  10 mg Oral Q breakfast  . heparin  5,000 Units Subcutaneous Q8H  . insulin aspart  0-15 Units Subcutaneous TID WC  . insulin aspart  0-5 Units Subcutaneous QHS  . isosorbide mononitrate  60 mg Oral QHS  . ramipril  5 mg Oral QHS  . rosuvastatin  40 mg Oral QHS  . sodium chloride      . sodium chloride      . spironolactone  12.5 mg Oral BID  . DISCONTD: sodium chloride  250 mL Intravenous Once   Continuous Infusions:   . sodium chloride 75 mL/hr at 06/02/11 2336   PRN Meds:.acetaminophen, acetaminophen, ondansetron (ZOFRAN) IV, ondansetron, zolpidem  Assessment/Plan:  Principal Problem:  *Dehydration, resolved with IVF Active Problems:  Vasovagal Syncope:  Likely secondary to dehydration.  Resolved  Ischemic cardiomyopathy:  Patient saw cardiology yesterday, and his medicine dosages were reduced.  Inpatient consult has been requested.  We will await further recommendations.  DM (diabetes mellitus): counseled on the importance of compliance with meds, checking blood sugars and proper diet.  Will need to follow up with his primary doctor for further management.  HTN (hypertension)  Dispo:  Can likely discharge home later today if cleared by cardiology.   LOS: 1 day   MEMON,JEHANZEB 06/03/2011, 9:56 AM

## 2011-06-03 NOTE — Plan of Care (Signed)
Problem: Discharge Progression Outcomes Goal: Activity appropriate for discharge plan Outcome: Completed/Met Date Met:  06/03/11 Ambulated in halls without dizziness or weakness Goal: Other Discharge Outcomes/Goals Outcome: Completed/Met Date Met:  06/03/11 Pt discharged to home with family verbalized understanding of all instructions

## 2011-06-03 NOTE — Progress Notes (Signed)
UR Chart Review Completed  

## 2011-06-03 NOTE — Consult Note (Signed)
[facility]     CONSULTATION NOTE  Reason for Consult: Syncope  Requesting Physician: Dr. Karilyn Cota  HPI: This is a 65 y.o. male with a past medical history significant for fairly new onset cardiomyopathy, thought to be a mixed nonischemic and ischemic cardiomyopathy. He underwent nuclear stress test in our office which demonstrated no reversible ischemia. Subsequently there was concern for balanced ischemia due to his reduced ejection fraction of 25-30% and global hypokinesis, he was therefore referred for cardiac catheterization. Dr. Tresa Endo performed cardiac catheterization in August of 2012 which demonstrated moderately severe right coronary artery disease and to 70% proximal, 50-60% distal, 70% PLA, and 50-60% mid LAD stenoses. There is a distal 80% LAD lesion and noncritical disease of the circumflex. EF was 35% at that time. Was also thought to have sleep apnea and other issues and was not intervened on at that time. It is unclear whether his anatomy was suitable for bypass or not. Yesterday was seen in the office by Dr. Alanda Amass and has been having weakness and dizziness over the past few days. He recently took a trip to [employer] in [place] and was orthostatic positive in the office. Dr. Alanda Amass refer him for blood work and at the laboratory he had a syncopal episode for which he had some warning and subsequently was brought by EMS to [facility]. 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 Today's EKG showed resolution of those T-wave inversions. He was orthostatic upon admission and was hydrated overnight. He denies any chest pain, pressure, palpitations or other associated symptoms.  PMHx:  Past Medical History  Diagnosis Date  . Diabetes mellitus   . Coronary artery disease   . High cholesterol   . Hypertension   . Stroke, Wallenberg's syndrome   . Right bundle branch block   . Cardiomyopathy   .  Sleep apnea    Past Surgical History  Procedure Date  . Cardiac catheterization 01/2011    FAMHx: History reviewed. No pertinent family history.  SOCHx:  reports that he has quit smoking. He does not have any smokeless tobacco history on file. He reports that he drinks alcohol. He reports that he does not use illicit drugs.  ALLERGIES: No Known Allergies  ROS: A comprehensive review of systems was negative except for: Neurological: positive for syncope  HOME MEDICATIONS: Prescriptions prior to admission  Medication Sig Dispense Refill  . aspirin EC 81 MG tablet Take 81 mg by mouth daily.        . carvedilol (COREG) 6.25 MG tablet Take 6.25-12.5 mg by mouth 2 (two) times daily. Take 2 tablets in the morning and one in the evening.       Marland Kitchen doxazosin (CARDURA) 4 MG tablet Take 4 mg by mouth at bedtime.        . furosemide (LASIX) 20 MG tablet Take 40 mg by mouth daily.        Marland Kitchen glipiZIDE (GLUCOTROL XL) 10 MG 24 hr tablet Take 10 mg by mouth daily.        Marland Kitchen ibuprofen (ADVIL,MOTRIN) 200 MG tablet Take 400 mg by mouth every 6 (six) hours as needed. Pain       . isosorbide mononitrate (IMDUR) 60 MG 24 hr tablet Take 60 mg by mouth at bedtime.        . metFORMIN (GLUMETZA) 500 MG (MOD) 24 hr tablet Take 500 mg by mouth 2 (two) times daily.        Marland Kitchen  Multiple Vitamins-Minerals (ONE-A-DAY 50 PLUS PO) Take 1 tablet by mouth daily.        . ramipril (ALTACE) 5 MG capsule Take 5 mg by mouth at bedtime.        . rosuvastatin (CRESTOR) 40 MG tablet Take 40 mg by mouth at bedtime.        Marland Kitchen spironolactone (ALDACTONE) 25 MG tablet Take 12.5 mg by mouth 2 (two) times daily.          HOSPITAL MEDICATIONS: Prior to Admission:  Prescriptions prior to admission  Medication Sig Dispense Refill  . aspirin EC 81 MG tablet Take 81 mg by mouth daily.        . carvedilol (COREG) 6.25 MG tablet Take 6.25-12.5 mg by mouth 2 (two) times daily. Take 2 tablets in the morning and one in the evening.       Marland Kitchen  doxazosin (CARDURA) 4 MG tablet Take 4 mg by mouth at bedtime.        . furosemide (LASIX) 20 MG tablet Take 40 mg by mouth daily.        Marland Kitchen glipiZIDE (GLUCOTROL XL) 10 MG 24 hr tablet Take 10 mg by mouth daily.        Marland Kitchen ibuprofen (ADVIL,MOTRIN) 200 MG tablet Take 400 mg by mouth every 6 (six) hours as needed. Pain       . isosorbide mononitrate (IMDUR) 60 MG 24 hr tablet Take 60 mg by mouth at bedtime.        . metFORMIN (GLUMETZA) 500 MG (MOD) 24 hr tablet Take 500 mg by mouth 2 (two) times daily.        . Multiple Vitamins-Minerals (ONE-A-DAY 50 PLUS PO) Take 1 tablet by mouth daily.        . ramipril (ALTACE) 5 MG capsule Take 5 mg by mouth at bedtime.        . rosuvastatin (CRESTOR) 40 MG tablet Take 40 mg by mouth at bedtime.        Marland Kitchen spironolactone (ALDACTONE) 25 MG tablet Take 12.5 mg by mouth 2 (two) times daily.          VITALS: Blood pressure 120/64, pulse 72, temperature 97.8 F (36.6 [name]), temperature source Oral, resp. rate 20, height 6\' 2"  (1.88 m), weight 103.057 kg (227 lb 3.2 oz), SpO2 93.00%.  PHYSICAL EXAM: General appearance: alert and no distress Neck: no adenopathy, no carotid bruit, no JVD, supple, symmetrical, trachea midline and thyroid not enlarged, symmetric, no tenderness/mass/nodules Lungs: clear to auscultation bilaterally Heart: regular rate and rhythm, S1, S2 normal, no murmur, click, rub or gallop Abdomen: soft, non-tender; bowel sounds normal; no masses,  no organomegaly Extremities: extremities normal, atraumatic, no cyanosis or edema Pulses: 2+ and symmetric Skin: Skin color, texture, turgor normal. No rashes or lesions Neurologic: Grossly normal  LABS: Results for orders placed during the hospital encounter of 06/02/11 (from the past 48 hour(s))  CBC     Status: Abnormal   Collection Time   06/02/11  2:12 PM      Component Value Range Comment   WBC 3.5 (*) 4.0 - 10.5 (K/uL)    RBC 4.21 (*) 4.22 - 5.81 (MIL/uL)    Hemoglobin 12.8 (*) 13.0 - 17.0  (g/dL)    HCT 40.9 (*) 81.1 - 52.0 (%)    MCV 83.8  78.0 - 100.0 (fL)    MCH 30.4  26.0 - 34.0 (pg)    MCHC 36.3 (*) 30.0 - 36.0 (g/dL)    RDW  13.2  11.5 - 15.5 (%)    Platelets 160  150 - 400 (K/uL)   BASIC METABOLIC PANEL     Status: Abnormal   Collection Time   06/02/11  2:12 PM      Component Value Range Comment   Sodium 132 (*) 135 - 145 (mEq/L)    Potassium 4.0  3.5 - 5.1 (mEq/L)    Chloride 95 (*) 96 - 112 (mEq/L)    CO2 26  19 - 32 (mEq/L)    Glucose, Bld 297 (*) 70 - 99 (mg/dL)    BUN 26 (*) 6 - 23 (mg/dL)    Creatinine, Ser 1.61  0.50 - 1.35 (mg/dL)    Calcium 8.8  8.4 - 10.5 (mg/dL)    GFR calc non Af Amer 84 (*) >90 (mL/min)    GFR calc Af Amer >90  >90 (mL/min)   D-DIMER, QUANTITATIVE     Status: Normal   Collection Time   06/02/11  2:12 PM      Component Value Range Comment   D-Dimer, Quant 0.25  0.00 - 0.48 (ug/mL-FEU)   POCT I-STAT TROPONIN I     Status: Normal   Collection Time   06/02/11  2:31 PM      Component Value Range Comment   Troponin i, poc 0.00  0.00 - 0.08 (ng/mL)    Comment 3            URINALYSIS, ROUTINE W REFLEX MICROSCOPIC     Status: Abnormal   Collection Time   06/02/11  5:26 PM      Component Value Range Comment   Color, Urine YELLOW  YELLOW     APPearance CLEAR  CLEAR     Specific Gravity, Urine 1.025  1.005 - 1.030     pH 5.5  5.0 - 8.0     Glucose, UA 500 (*) NEGATIVE (mg/dL)    Hgb urine dipstick NEGATIVE  NEGATIVE     Bilirubin Urine NEGATIVE  NEGATIVE     Ketones, ur TRACE (*) NEGATIVE (mg/dL)    Protein, ur NEGATIVE  NEGATIVE (mg/dL)    Urobilinogen, UA 0.2  0.0 - 1.0 (mg/dL)    Nitrite NEGATIVE  NEGATIVE     Leukocytes, UA NEGATIVE  NEGATIVE  MICROSCOPIC NOT DONE ON URINES WITH NEGATIVE PROTEIN, BLOOD, LEUKOCYTES, NITRITE, OR GLUCOSE <1000 mg/dL.  GLUCOSE, CAPILLARY     Status: Abnormal   Collection Time   06/02/11  8:41 PM      Component Value Range Comment   Glucose-Capillary 266 (*) 70 - 99 (mg/dL)    Comment 1 Notify RN      CARDIAC PANEL(CRET KIN+CKTOT+MB+TROPI)     Status: Normal   Collection Time   06/03/11 12:08 AM      Component Value Range Comment   Total CK 197  7 - 232 (U/L)    CK, MB 2.3  0.3 - 4.0 (ng/mL)    Troponin I <0.30  <0.30 (ng/mL)    Relative Index 1.2  0.0 - 2.5    PRO B NATRIURETIC PEPTIDE     Status: Normal   Collection Time   06/03/11  4:00 AM      Component Value Range Comment   BNP, POC 95.8  0 - 125 (pg/mL)   COMPREHENSIVE METABOLIC PANEL     Status: Abnormal   Collection Time   06/03/11  4:00 AM      Component Value Range Comment   Sodium 134 (*) 135 - 145 (  mEq/L)    Potassium 3.4 (*) 3.5 - 5.1 (mEq/L)    Chloride 100  96 - 112 (mEq/L)    CO2 25  19 - 32 (mEq/L)    Glucose, Bld 172 (*) 70 - 99 (mg/dL)    BUN 22  6 - 23 (mg/dL)    Creatinine, Ser 9.14  0.50 - 1.35 (mg/dL)    Calcium 8.7  8.4 - 10.5 (mg/dL)    Total Protein 6.6  6.0 - 8.3 (g/dL)    Albumin 3.2 (*) 3.5 - 5.2 (g/dL)    AST 22  0 - 37 (U/L)    ALT 22  0 - 53 (U/L)    Alkaline Phosphatase 77  39 - 117 (U/L)    Total Bilirubin 0.3  0.3 - 1.2 (mg/dL)    GFR calc non Af Amer >90  >90 (mL/min)    GFR calc Af Amer >90  >90 (mL/min)   CBC     Status: Abnormal   Collection Time   06/03/11  4:00 AM      Component Value Range Comment   WBC 3.7 (*) 4.0 - 10.5 (K/uL)    RBC 3.77 (*) 4.22 - 5.81 (MIL/uL)    Hemoglobin 11.5 (*) 13.0 - 17.0 (g/dL)    HCT 78.2 (*) 95.6 - 52.0 (%)    MCV 83.6  78.0 - 100.0 (fL)    MCH 30.5  26.0 - 34.0 (pg)    MCHC 36.5 (*) 30.0 - 36.0 (g/dL)    RDW 21.3  08.6 - 57.8 (%)    Platelets 160  150 - 400 (K/uL)   GLUCOSE, CAPILLARY     Status: Abnormal   Collection Time   06/03/11  7:34 AM      Component Value Range Comment   Glucose-Capillary 163 (*) 70 - 99 (mg/dL)    Comment 1 Notify RN      Comment 2 Documented in Chart     CARDIAC PANEL(CRET KIN+CKTOT+MB+TROPI)     Status: Normal   Collection Time   06/03/11  7:45 AM      Component Value Range Comment   Total CK 183  7 - 232  (U/L)    CK, MB 2.3  0.3 - 4.0 (ng/mL)    Troponin I <0.30  <0.30 (ng/mL)    Relative Index 1.3  0.0 - 2.5    GLUCOSE, CAPILLARY     Status: Abnormal   Collection Time   06/03/11 11:42 AM      Component Value Range Comment   Glucose-Capillary 197 (*) 70 - 99 (mg/dL)    Comment 1 Notify RN      Comment 2 Documented in Chart       IMAGING: Dg Chest 2 View  06/02/2011  *RADIOLOGY REPORT*  Clinical Data: Cough.  History of hypertension.  Former smoker.  CHEST - 2 VIEW 06/02/2011:  Comparison: Two-view chest x-ray 01/28/2011 [facility].  Findings: Cardiac silhouette normal in size.  Thoracic aorta atherosclerotic, unchanged.  Hilar and mediastinal contours otherwise unremarkable.  Lungs clear.  No pleural effusions. Degenerative changes involving the thoracic spine.  IMPRESSION: No acute cardiopulmonary disease.  Original Report Authenticated By: Arnell Sieving, M.D.   Ct Head Wo Contrast  06/02/2011  *RADIOLOGY REPORT*  Clinical Data: Dizziness.  Loss of consciousness.  History of diabetes and hypertension.  CT HEAD WITHOUT CONTRAST 06/02/2011:  Technique:  Contiguous axial images were obtained from the base of the skull through the vertex without contrast.  Comparison: None.  Findings: Encephalomalacia in the inferior left cerebellar hemisphere consistent with old stroke.  Old lacunar stroke in the inferior left basal ganglia.  Ventricular system normal in size and appearance for age.  No mass lesion.  No midline shift.  No acute hemorrhage or hematoma.  No extra-axial fluid collections.  No evidence of acute infarction.  No focal osseous abnormality involving the skull.  Visualized paranasal sinuses, mastoid air cells, and middle ear cavities well- aerated.  Mild bilateral carotid siphon and left vertebral artery atherosclerosis.  IMPRESSION:  1.  No acute intracranial abnormality. 2.  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 3.  Old lacunar stroke in the  inferior left basal ganglia.  Original Report Authenticated By: Arnell Sieving, M.D.    IMPRESSION: 1. Syncope secondary to orthostatic hypotension 2. Moderate to severe coronary disease of the right and left coronary arteries 3. 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 Obstructive sleep apnea on CPAP 5. Diabetes type 2 6. Dyslipidemia  RECOMMENDATION: 1. [name] [name] orthostatic symptoms have improved with IV fluid hydration.  We have discontinued his Imdur and reduce the dose of his aldactone. In addition his dose of Coreg was reduced. 2. If you have any stable for home discharge today. 3. He has a followup appointment scheduled with Dr. Alanda Amass. 4. He will need to be reevaluated for his diffuse coronary disease is possible intervention or bypass may improve his ejection fraction.  I would defer to Dr. Tresa Endo in Dr. Alanda Amass for management of this.  Thanks for the consult.  Time Spent Directly with Patient: 30 minutes  [name],[name] [name] 06/03/2011, 12:37 PM

## 2011-07-01 DIAGNOSIS — R0902 Hypoxemia: Secondary | ICD-10-CM | POA: Diagnosis not present

## 2011-07-01 DIAGNOSIS — G4730 Sleep apnea, unspecified: Secondary | ICD-10-CM | POA: Diagnosis not present

## 2011-07-01 DIAGNOSIS — J310 Chronic rhinitis: Secondary | ICD-10-CM | POA: Diagnosis not present

## 2011-07-06 DIAGNOSIS — I519 Heart disease, unspecified: Secondary | ICD-10-CM

## 2011-07-06 DIAGNOSIS — I2510 Atherosclerotic heart disease of native coronary artery without angina pectoris: Secondary | ICD-10-CM

## 2011-07-06 HISTORY — DX: Atherosclerotic heart disease of native coronary artery without angina pectoris: I25.10

## 2011-07-06 HISTORY — DX: Heart disease, unspecified: I51.9

## 2011-07-07 ENCOUNTER — Other Ambulatory Visit: Payer: Self-pay | Admitting: Surgery

## 2011-07-07 ENCOUNTER — Encounter: Payer: Self-pay | Admitting: Surgery

## 2011-07-07 ENCOUNTER — Institutional Professional Consult (permissible substitution) (INDEPENDENT_AMBULATORY_CARE_PROVIDER_SITE_OTHER): Payer: Medicare Other | Admitting: Surgery

## 2011-07-07 ENCOUNTER — Other Ambulatory Visit: Payer: Self-pay

## 2011-07-07 VITALS — BP 128/68 | HR 68 | Resp 16 | Ht 74.0 in | Wt 234.0 lb

## 2011-07-07 DIAGNOSIS — I2510 Atherosclerotic heart disease of native coronary artery without angina pectoris: Secondary | ICD-10-CM

## 2011-07-07 DIAGNOSIS — I2589 Other forms of chronic ischemic heart disease: Secondary | ICD-10-CM

## 2011-07-16 ENCOUNTER — Encounter: Payer: Self-pay | Admitting: Surgery

## 2011-07-16 NOTE — Progress Notes (Signed)
[address]            [name] [address]          [phone]      PCP is Fara Chute, MD Referring Provider is Governor Rooks, MD  Chief Complaint  Patient presents with  . Cardiomyopathy    eval for cabg  . Coronary Artery Disease    HPI:  Patient is a 66 yo gentleman with diabetes and coronary artery disease with known cardiomyopathy and EF 25% who has been treated medically.  He presented with congestive heart failure symptoms and medications were titrated upward with initial response.  In December he went to [employer] and felt weak and fatigued and developed significant orthostatic hypotension when he arrived home.  He was felt to be dehydrated and medications were decreased. He had a syncopal episode when he went for lab draw and went to [facility], where he ruled out for MI. Since then he has improved but continues to have significant exercise intolerance.  Most recent cath was performed 01/2011 to see if he had an ischemic component to his cardiomyopathy.  This showed significant multivessel disease with EF of 30-35%. His persantine stress test in 09/2010 showed mild perfusion defect in the Apical region with no inducible ischemia and EF of 35%. 2D echo 12/2010 showed EF 35-40% with mild to moderate MR.  Past Medical History  Diagnosis Date  . Diabetes mellitus   . Coronary artery disease   . High cholesterol   . Hypertension   . Stroke, Wallenberg's syndrome   . Right bundle branch block   . Cardiomyopathy   . Sleep apnea   . CAD (coronary artery disease) 07/06/2011  . LV dysfunction 07/06/2011    Past Surgical History  Procedure Date  . Cardiac catheterization 01/2011    History reviewed. No pertinent family history.  Social History History  Substance Use Topics  . Smoking status: Former Games developer  . Smokeless tobacco: Not on file  . Alcohol Use: Yes     occ. use    Current Outpatient Prescriptions  Medication Sig  Dispense Refill  . aspirin EC 81 MG tablet Take 81 mg by mouth daily.        . carvedilol (COREG) 6.25 MG tablet Take 6.25 mg by mouth 2 (two) times daily with a meal. Take 1 tablets in the morning and one in the evening.       Marland Kitchen doxazosin (CARDURA) 4 MG tablet Take 4 mg by mouth at bedtime.        . furosemide (LASIX) 20 MG tablet Take 2 tablets (40 mg total) by mouth daily. Resume after 3 days      . glipiZIDE (GLUCOTROL XL) 10 MG 24 hr tablet Take 10 mg by mouth daily.        Marland Kitchen ibuprofen (ADVIL,MOTRIN) 200 MG tablet Take 400 mg by mouth every 6 (six) hours as needed. Pain       . isosorbide mononitrate (IMDUR) 60 MG 24 hr tablet Take 0.5 tablets (30 mg total) by mouth at bedtime.      . metFORMIN (GLUMETZA) 500 MG (MOD) 24 hr tablet Take 500 mg by mouth 2 (two) times daily.        . Multiple Vitamins-Minerals (ONE-A-DAY 50 PLUS PO) Take 1 tablet by mouth daily.        . ramipril (ALTACE)  5 MG capsule Take 2.5 mg by mouth at bedtime. Resume after 3 days       . spironolactone (ALDACTONE) 25 MG tablet Take 12.5 mg by mouth 2 (two) times daily.        . rosuvastatin (CRESTOR) 40 MG tablet Take 40 mg by mouth at bedtime.          No Known Allergies  Review of Systems  Constitutional: Positive for fatigue. Negative for fever, chills and unexpected weight change.  HENT: Negative.   Eyes: Negative.   Respiratory: Positive for shortness of breath.   Cardiovascular: Negative for chest pain, palpitations and leg swelling.  Gastrointestinal: Negative.   Genitourinary: Negative.   Musculoskeletal: Negative.   Neurological: Negative.   Hematological: Negative.   Psychiatric/Behavioral: Negative.     BP 128/68  Pulse 68  Resp 16  Ht 6\' 2"  (1.88 m)  Wt 234 lb (106.142 kg)  BMI 30.04 kg/m2  SpO2 98% Physical Exam  Constitutional: He is oriented to person, place, and time. He appears well-developed and well-nourished.  HENT:  Head: Normocephalic and atraumatic.  Mouth/Throat: Oropharynx  is clear and moist.  Eyes: EOM are normal. Pupils are equal, round, and reactive to light.  Neck: Neck supple. No JVD present. No thyromegaly present.  Cardiovascular: Normal rate and regular rhythm.  Exam reveals no gallop and no friction rub.   Murmur heard. Pulmonary/Chest: Effort normal and breath sounds normal.  Abdominal: Soft. Bowel sounds are normal. He exhibits no mass. There is no tenderness.  Musculoskeletal: He exhibits no edema and no tenderness.  Lymphadenopathy:    He has no cervical adenopathy.  Neurological: He is alert and oriented to person, place, and time. He has normal strength. No cranial nerve deficit or sensory deficit.  Skin: Skin is warm and dry.  Psychiatric: He has a normal mood and affect.     Diagnostic Tests:  HEMODYNAMIC DATA: Right atrial mean pressure 8, A-wave 12. Right  ventricular pressure 48/8. Pulmonary pressure 48/22, mean pulmonary  capillary wedge pressure 16.  Central aortic pressure 159/73. Left ventricular pressure 159/13, post  A-wave 24.  O2 saturation 96%, PA saturation 71%. Cardiac output by the  thermodilution method was 5.9 and by the Fick method was 6.6 L/min.  Cardiac index was 2.5 and 2.9 L/min/m2 respectively.  Left ventriculography: RAO ventriculography revealed dilated left  ventricle with severe global hypocontractility. Ejection fraction is in  the 30-35% range.  Distal aortography did not demonstrate any renal artery stenosis. There  was mild irregularity of the aorta without significant stenoses.  Fluoroscopy of the coronary arteries revealed coronary calcification.  The left main coronary artery bifurcated into the LAD and left  circumflex system.  The LAD had evidence for coronary calcification. After the first  diagonal vessel, the LAD was diffusely narrowed of approximately 50-60%  diffusely, although the RAO cranial view suggested perhaps this was  tighter. In the mid distal LAD, there was focal 80% stenosis.  These  lesions did improve slightly following IC nitroglycerin administration.  The circumflex vessel had smooth ostial narrowing of 20-30%. There was  mild luminal irregularity in the mid AV groove circumflex of 20%.  The right coronary artery was a large dominant vessel that was tortuous.  There was up to 70% smooth stenosis after the proximal bend before the  secondary proximal bend. There was 99% stenosis at the ostium of a  small anterior RV marginal branch. There was 30% mid RCA stenosis  followed by 50% stenosis  before the acute margin and then 50-60%  stenosis before the PDA takeoff in the distal RCA. There was 70%  stenosis in the proximal portion of the PLA vessel.   Impression:  The patient has significant multivessel coronary disease with moderate left ventricular dysfunction. I think coronary bypass graft surgery is indicated to prevent further ischemia and infarction and hopefully improve his left ventricular function and quality of life. I would not plan to place an epicardial left ventricular pacing lead at this time since there is a risk of infection; if he does eventually need a biventricular pacemaker, a coronary sinus lead can be placed transvenously most of the time. I discussed the operative procedure with the patient and family including alternatives, benefits and risks; including but not limited to bleeding, blood transfusion, infection, stroke, myocardial infarction, graft failure, heart block requiring a permanent pacemaker, organ dysfunction, and death.  [name] [name] understands and agrees to proceed.  We will schedule surgery for later this month at the patient's request.  Plan:  CABG surgery to be planned for end of January.

## 2011-07-17 ENCOUNTER — Encounter (HOSPITAL_COMMUNITY): Payer: Self-pay | Admitting: Pharmacy Technician

## 2011-07-28 DIAGNOSIS — I2510 Atherosclerotic heart disease of native coronary artery without angina pectoris: Secondary | ICD-10-CM | POA: Diagnosis not present

## 2011-07-28 DIAGNOSIS — I4510 Unspecified right bundle-branch block: Secondary | ICD-10-CM | POA: Diagnosis not present

## 2011-07-28 DIAGNOSIS — E119 Type 2 diabetes mellitus without complications: Secondary | ICD-10-CM | POA: Diagnosis not present

## 2011-07-28 DIAGNOSIS — I5022 Chronic systolic (congestive) heart failure: Secondary | ICD-10-CM | POA: Diagnosis not present

## 2011-07-30 ENCOUNTER — Other Ambulatory Visit: Payer: Self-pay

## 2011-07-30 ENCOUNTER — Ambulatory Visit (HOSPITAL_COMMUNITY)
Admission: RE | Admit: 2011-07-30 | Discharge: 2011-07-30 | Disposition: A | Discharge status: Home / Self Care | Payer: Medicare Other | Source: Ambulatory Visit | Attending: Surgery | Admitting: Surgery

## 2011-07-30 ENCOUNTER — Encounter (HOSPITAL_COMMUNITY)
Admission: RE | Admit: 2011-07-30 | Discharge: 2011-07-30 | Disposition: A | Discharge status: Home / Self Care | Payer: Medicare Other | Source: Ambulatory Visit | Attending: Surgery | Admitting: Surgery

## 2011-07-30 ENCOUNTER — Encounter (HOSPITAL_COMMUNITY): Payer: Self-pay

## 2011-07-30 DIAGNOSIS — Z01810 Encounter for preprocedural cardiovascular examination: Secondary | ICD-10-CM | POA: Insufficient documentation

## 2011-07-30 DIAGNOSIS — I2510 Atherosclerotic heart disease of native coronary artery without angina pectoris: Secondary | ICD-10-CM

## 2011-07-30 DIAGNOSIS — J9819 Other pulmonary collapse: Secondary | ICD-10-CM | POA: Diagnosis not present

## 2011-07-30 DIAGNOSIS — J988 Other specified respiratory disorders: Secondary | ICD-10-CM | POA: Diagnosis not present

## 2011-07-30 DIAGNOSIS — E119 Type 2 diabetes mellitus without complications: Secondary | ICD-10-CM

## 2011-07-30 DIAGNOSIS — I10 Essential (primary) hypertension: Secondary | ICD-10-CM | POA: Diagnosis not present

## 2011-07-30 DIAGNOSIS — Z01811 Encounter for preprocedural respiratory examination: Secondary | ICD-10-CM | POA: Diagnosis not present

## 2011-07-30 HISTORY — DX: Unspecified osteoarthritis, unspecified site: M19.90

## 2011-07-30 HISTORY — DX: Heart failure, unspecified: I50.9

## 2011-07-30 HISTORY — DX: Cerebral infarction, unspecified: I63.9

## 2011-07-30 LAB — CBC
HCT: 33.9 % — ABNORMAL LOW (ref 39.0–52.0)
Hemoglobin: 12.1 g/dL — ABNORMAL LOW (ref 13.0–17.0)
WBC: 6.2 10*3/uL (ref 4.0–10.5)

## 2011-07-30 LAB — COMPREHENSIVE METABOLIC PANEL
Alkaline Phosphatase: 73 U/L (ref 39–117)
BUN: 17 mg/dL (ref 6–23)
CO2: 23 mEq/L (ref 19–32)
Chloride: 100 mEq/L (ref 96–112)
GFR calc Af Amer: >90 mL/min (ref >90)
Glucose, Bld: 276 mg/dL — ABNORMAL HIGH (ref 70–99)
Potassium: 3.9 mEq/L (ref 3.5–5.1)
Total Bilirubin: 0.5 mg/dL (ref 0.3–1.2)

## 2011-07-30 LAB — URINALYSIS, ROUTINE W REFLEX MICROSCOPIC
Glucose, UA: >1000 mg/dL — AB
Leukocytes, UA: NEGATIVE
Protein, ur: NEGATIVE mg/dL

## 2011-07-30 LAB — BLOOD GAS, ARTERIAL
Bicarbonate: 25.8 mEq/L — ABNORMAL HIGH (ref 20.0–24.0)
TCO2: 26.7 mmol/L (ref 0–100)
pCO2 arterial: 31.8 mmHg — ABNORMAL LOW (ref 35.0–45.0)
pH, Arterial: 7.52 — ABNORMAL HIGH (ref 7.350–7.450)

## 2011-07-30 LAB — URINE MICROSCOPIC-ADD ON

## 2011-07-30 LAB — SURGICAL PCR SCREEN: Staphylococcus aureus: NEGATIVE

## 2011-07-30 LAB — TYPE AND SCREEN: ABO/RH(D): O NEG

## 2011-07-30 MED ORDER — CHLORHEXIDINE GLUCONATE 4 % EX LIQD: 30.0000 mL | CUTANEOUS | Status: DC

## 2011-07-30 NOTE — Pre-Procedure Instructions (Addendum)
20 [name] [name]  07/30/2011   Your procedure is scheduled on:  Monday, February 4th.   Report to [facility] at 5:30 AM.   Call this number if you have problems the morning of surgery: [phone]   Remember:   Do not eat food:After Midnight.  May have clear liquids: up to 4 Hours before arrival. 1:30am  Clear liquids include soda, tea, black coffee, apple or grape juice, broth.  Take these medicines the morning of surgery with A SIP OF WATER:  Carvedilol (Coreg), Doxazosin (Cardura), Isosorbide (Imdur).    Stop Voltren, Adavil and any NSAIDS.     Do not wear jewelry, make-up or nail polish.  Do not wear lotions, powders, or perfumes. You may wear deodorant.  Do not shave 48 hours prior to surgery.  Do not bring valuables to the hospital.  Contacts, dentures or bridgework may not be worn into surgery.  Leave suitcase in the car. After surgery it may be brought to your room.  For patients admitted to the hospital, checkout time is 11:00 AM the day of discharge.   Patients discharged the day of surgery will not be allowed to drive home.  Name and phone number of your driver: NA  Special Instructions: CHG Shower Use Special Wash: 1/2 bottle night before surgery and 1/2 bottle morning of surgery.   Please read over the following fact sheets that you were given: Pain Booklet, Coughing and Deep Breathing, Blood Transfusion Information, Open Heart Packet, MRSA Information and Surgical Site Infection Prevention

## 2011-07-30 NOTE — Progress Notes (Signed)
Pre-op Cardiac Surgery  Carotid Findings:  No ICA stenosis.  Vertebral artery flow is antegrade.  Upper Extremity Right Left  Brachial Pressures 171T 165T  Radial Waveforms T T  Ulnar Waveforms T T  Palmar Arch (Allen's Test) Doppler signal diminishes >50% with both radial and ulnar compression Doppler signal remains normal with radial compression and diminishes >50% with ulnar compression.   Findings:      Lower  Extremity Right Left  Dorsalis Pedis 188 M 186 M  Anterior Tibial    Posterior Tibial 196 M 205 M  Ankle/Brachial Indices >1.0 >1.0    Findings:  WNL Although pressures may be falsely elevated secondary to calcified vessels.

## 2011-07-31 LAB — HEMOGLOBIN A1C: Hgb A1c MFr Bld: 8.7 % — ABNORMAL HIGH (ref <5.7)

## 2011-07-31 NOTE — Consult Note (Signed)
Anesthesia:  Patient is a 66 year old male scheduled for CABG on 08/03/11.  Other history includes OSA, DM2, hypercholesterolemia, HTN, CVA/Wallenberg's Syndrome, former smoker, BPH, CM/CHF with EF 25% (30-40% by most recent studies).  CXR shows no acute process.  EKG shows NSR, bifascicular block.  Cardiac cath from 02/09/11 showed: 1. Ischemic cardiomyopathy with an ejection fraction of 30-35% with significant global left ventricular dysfunction.  2. Multivessel coronary artery disease with evidence for coronary calcification, diffuse 50-60% left anterior descending stenosis in the mid segment with 80% focal mid distal stenosis, improved slightly following intracoronary nitroglycerin administration; 20-30% ostial circumflex stenosis with 20% narrowing in the proximal  circumflex; and diffusely diseased right coronary artery with  smooth 70% stenosis proximally, 99% stenosis in the anterior right ventricular marginal branch, 30-50% mid right coronary artery stenosis before the acute margin with 50-60% stenosis after the crux, and prior to posterior descending artery takeoff with 70% posterolateral stenosis.  Records from Dr. Kandis Cocking office include an echo from 01/06/11 showing a EF 35-40%, mod global hypokinesis LV, LA mildly dilated, mild-mod MR, mild TR.  His stress test was done 10/20/10 showing EF 35%.  Labs noted.  I also reviewed his blood gas results with Anesthesiologist Dr. Chaney Malling.  His last sleep study is also on his chart for review.  Plan to proceed.

## 2011-08-02 MED ORDER — EPINEPHRINE HCL 1 MG/ML IJ SOLN
0.5000 ug/min | INTRAMUSCULAR | Status: DC
  Filled 2011-08-02: qty 4

## 2011-08-02 MED ORDER — DOPAMINE-DEXTROSE 3.2-5 MG/ML-% IV SOLN
2.0000 ug/kg/min | INTRAVENOUS | Status: DC
  Filled 2011-08-02: qty 250

## 2011-08-02 MED ORDER — DEXTROSE 5 % IV SOLN
750.0000 mg | INTRAVENOUS | Status: DC
  Filled 2011-08-02: qty 750

## 2011-08-02 MED ORDER — VANCOMYCIN HCL 1000 MG IV SOLR
1500.0000 mg | INTRAVENOUS | Status: AC
  Administered 2011-08-03: 1500 mg via INTRAVENOUS
  Filled 2011-08-02: qty 1500

## 2011-08-02 MED ORDER — PHENYLEPHRINE HCL 10 MG/ML IJ SOLN
30.0000 ug/min | INTRAVENOUS | Status: AC
  Administered 2011-08-03: 10 ug/min via INTRAVENOUS
  Filled 2011-08-02: qty 2

## 2011-08-02 MED ORDER — DEXMEDETOMIDINE HCL 100 MCG/ML IV SOLN
0.1000 ug/kg/h | INTRAVENOUS | Status: AC
  Administered 2011-08-03: .2 ug/kg/h via INTRAVENOUS
  Filled 2011-08-02: qty 4

## 2011-08-02 MED ORDER — MAGNESIUM SULFATE 50 % IJ SOLN
40.0000 meq | INTRAMUSCULAR | Status: DC
  Filled 2011-08-02: qty 10

## 2011-08-02 MED ORDER — POTASSIUM CHLORIDE 2 MEQ/ML IV SOLN
80.0000 meq | INTRAVENOUS | Status: DC
  Filled 2011-08-02: qty 40

## 2011-08-02 MED ORDER — METOPROLOL TARTRATE 12.5 MG HALF TABLET: 12.5000 mg | ORAL_TABLET | Freq: Once | ORAL | Status: DC

## 2011-08-02 MED ORDER — SODIUM CHLORIDE 0.9 % IV SOLN
INTRAVENOUS | Status: AC
  Administered 2011-08-03: 70 mL/h via INTRAVENOUS
  Filled 2011-08-02: qty 40

## 2011-08-02 MED ORDER — CEFUROXIME SODIUM 1.5 G IJ SOLR
1.5000 g | INTRAMUSCULAR | Status: AC
  Administered 2011-08-03: 1.5 g via INTRAVENOUS
  Filled 2011-08-02: qty 1.5

## 2011-08-02 MED ORDER — VERAPAMIL HCL 2.5 MG/ML IV SOLN
INTRAVENOUS | Status: AC
  Administered 2011-08-03: 10:00:00
  Filled 2011-08-02: qty 2.5

## 2011-08-02 MED ORDER — INSULIN REGULAR HUMAN 100 UNIT/ML IJ SOLN
INTRAMUSCULAR | Status: AC
  Administered 2011-08-03: 1 [IU]/h via INTRAVENOUS
  Filled 2011-08-02: qty 1

## 2011-08-02 MED ORDER — NITROGLYCERIN IN D5W 200-5 MCG/ML-% IV SOLN
2.0000 ug/min | INTRAVENOUS | Status: AC
  Administered 2011-08-03: 16 ug/min via INTRAVENOUS
  Filled 2011-08-02: qty 250

## 2011-08-03 ENCOUNTER — Encounter (HOSPITAL_COMMUNITY): Payer: Self-pay | Admitting: Vascular Surgery

## 2011-08-03 ENCOUNTER — Other Ambulatory Visit: Payer: Self-pay

## 2011-08-03 ENCOUNTER — Ambulatory Visit (HOSPITAL_COMMUNITY): Payer: Medicare Other | Admitting: Vascular Surgery

## 2011-08-03 ENCOUNTER — Inpatient Hospital Stay (HOSPITAL_COMMUNITY)
Admission: RE | Admit: 2011-08-03 | Discharge: 2011-08-07 | DRG: 236 | Disposition: A | Discharge status: Home / Self Care | Payer: Medicare Other | Source: Ambulatory Visit | Attending: Surgery | Admitting: Surgery

## 2011-08-03 ENCOUNTER — Encounter (HOSPITAL_COMMUNITY): Payer: Self-pay | Admitting: *Deleted

## 2011-08-03 ENCOUNTER — Inpatient Hospital Stay (HOSPITAL_COMMUNITY): Payer: Medicare Other

## 2011-08-03 ENCOUNTER — Encounter (HOSPITAL_COMMUNITY)
Admission: RE | Disposition: A | Discharge status: Home / Self Care | Payer: Self-pay | Source: Ambulatory Visit | Attending: Surgery

## 2011-08-03 DIAGNOSIS — E119 Type 2 diabetes mellitus without complications: Secondary | ICD-10-CM | POA: Diagnosis present

## 2011-08-03 DIAGNOSIS — Z7982 Long term (current) use of aspirin: Secondary | ICD-10-CM | POA: Diagnosis not present

## 2011-08-03 DIAGNOSIS — Z951 Presence of aortocoronary bypass graft: Secondary | ICD-10-CM | POA: Diagnosis not present

## 2011-08-03 DIAGNOSIS — I4891 Unspecified atrial fibrillation: Secondary | ICD-10-CM | POA: Diagnosis not present

## 2011-08-03 DIAGNOSIS — J90 Pleural effusion, not elsewhere classified: Secondary | ICD-10-CM | POA: Diagnosis not present

## 2011-08-03 DIAGNOSIS — Z01810 Encounter for preprocedural cardiovascular examination: Secondary | ICD-10-CM

## 2011-08-03 DIAGNOSIS — J988 Other specified respiratory disorders: Secondary | ICD-10-CM | POA: Diagnosis not present

## 2011-08-03 DIAGNOSIS — I2589 Other forms of chronic ischemic heart disease: Secondary | ICD-10-CM | POA: Diagnosis present

## 2011-08-03 DIAGNOSIS — I509 Heart failure, unspecified: Secondary | ICD-10-CM | POA: Diagnosis present

## 2011-08-03 DIAGNOSIS — Z8673 Personal history of transient ischemic attack (TIA), and cerebral infarction without residual deficits: Secondary | ICD-10-CM | POA: Diagnosis not present

## 2011-08-03 DIAGNOSIS — I517 Cardiomegaly: Secondary | ICD-10-CM | POA: Diagnosis not present

## 2011-08-03 DIAGNOSIS — Z87891 Personal history of nicotine dependence: Secondary | ICD-10-CM

## 2011-08-03 DIAGNOSIS — I4510 Unspecified right bundle-branch block: Secondary | ICD-10-CM | POA: Diagnosis present

## 2011-08-03 DIAGNOSIS — I10 Essential (primary) hypertension: Secondary | ICD-10-CM | POA: Diagnosis present

## 2011-08-03 DIAGNOSIS — J9819 Other pulmonary collapse: Secondary | ICD-10-CM | POA: Diagnosis not present

## 2011-08-03 DIAGNOSIS — Z794 Long term (current) use of insulin: Secondary | ICD-10-CM | POA: Diagnosis not present

## 2011-08-03 DIAGNOSIS — G4733 Obstructive sleep apnea (adult) (pediatric): Secondary | ICD-10-CM | POA: Diagnosis present

## 2011-08-03 DIAGNOSIS — I2510 Atherosclerotic heart disease of native coronary artery without angina pectoris: Principal | ICD-10-CM | POA: Diagnosis present

## 2011-08-03 DIAGNOSIS — E785 Hyperlipidemia, unspecified: Secondary | ICD-10-CM | POA: Diagnosis present

## 2011-08-03 DIAGNOSIS — D649 Anemia, unspecified: Secondary | ICD-10-CM | POA: Diagnosis not present

## 2011-08-03 HISTORY — DX: Presence of aortocoronary bypass graft: Z95.1

## 2011-08-03 HISTORY — PX: CORONARY ARTERY BYPASS GRAFT: SHX141

## 2011-08-03 LAB — GLUCOSE, CAPILLARY
Glucose-Capillary: 283 mg/dL — ABNORMAL HIGH (ref 70–99)
Glucose-Capillary: 128 mg/dL — ABNORMAL HIGH (ref 70–99)
Glucose-Capillary: 122 mg/dL — ABNORMAL HIGH (ref 70–99)
Glucose-Capillary: 112 mg/dL — ABNORMAL HIGH (ref 70–99)
Glucose-Capillary: 99 mg/dL (ref 70–99)
Glucose-Capillary: 132 mg/dL — ABNORMAL HIGH (ref 70–99)

## 2011-08-03 LAB — POCT I-STAT 4, (NA,K, GLUC, HGB,HCT)
Glucose, Bld: 285 mg/dL — ABNORMAL HIGH (ref 70–99)
Glucose, Bld: 205 mg/dL — ABNORMAL HIGH (ref 70–99)
Glucose, Bld: 194 mg/dL — ABNORMAL HIGH (ref 70–99)
Glucose, Bld: 178 mg/dL — ABNORMAL HIGH (ref 70–99)
Glucose, Bld: 133 mg/dL — ABNORMAL HIGH (ref 70–99)
HCT: 31 % — ABNORMAL LOW (ref 39.0–52.0)
HCT: 23 % — ABNORMAL LOW (ref 39.0–52.0)
Hemoglobin: 10.5 g/dL — ABNORMAL LOW (ref 13.0–17.0)
Hemoglobin: 7.8 g/dL — ABNORMAL LOW (ref 13.0–17.0)
Hemoglobin: 9.9 g/dL — ABNORMAL LOW (ref 13.0–17.0)
Hemoglobin: 8.2 g/dL — ABNORMAL LOW (ref 13.0–17.0)
Hemoglobin: 10.9 g/dL — ABNORMAL LOW (ref 13.0–17.0)
Potassium: 4.2 mEq/L (ref 3.5–5.1)
Potassium: 4.1 mEq/L (ref 3.5–5.1)
Potassium: 5.2 mEq/L — ABNORMAL HIGH (ref 3.5–5.1)
Potassium: 3.7 mEq/L (ref 3.5–5.1)
Sodium: 139 mEq/L (ref 135–145)
Sodium: 138 mEq/L (ref 135–145)
Sodium: 138 mEq/L (ref 135–145)

## 2011-08-03 LAB — POCT I-STAT 3, ART BLOOD GAS (G3+)
Acid-Base Excess: 2 mmol/L (ref 0.0–2.0)
Acid-base deficit: 1 mmol/L (ref 0.0–2.0)
Bicarbonate: 23.8 mEq/L (ref 20.0–24.0)
Bicarbonate: 22.4 mEq/L (ref 20.0–24.0)
O2 Saturation: 100 %
O2 Saturation: 99 %
Patient temperature: 35.2
Patient temperature: 36.3
Patient temperature: 36.4
TCO2: 25 mmol/L (ref 0–100)
TCO2: 24 mmol/L (ref 0–100)
pCO2 arterial: 51.1 mmHg — ABNORMAL HIGH (ref 35.0–45.0)
pCO2 arterial: 42.7 mmHg (ref 35.0–45.0)
pH, Arterial: 7.389 (ref 7.350–7.450)
pH, Arterial: 7.326 — ABNORMAL LOW (ref 7.350–7.450)
pO2, Arterial: 164 mmHg — ABNORMAL HIGH (ref 80.0–100.0)

## 2011-08-03 LAB — POCT I-STAT, CHEM 8
BUN: 14 mg/dL (ref 6–23)
Calcium, Ion: 1.13 mmol/L (ref 1.12–1.32)
Chloride: 108 mEq/L (ref 96–112)
Creatinine, Ser: 0.6 mg/dL (ref 0.50–1.35)
Glucose, Bld: 118 mg/dL — ABNORMAL HIGH (ref 70–99)

## 2011-08-03 LAB — CBC
Hemoglobin: 10.8 g/dL — ABNORMAL LOW (ref 13.0–17.0)
MCH: 30.2 pg (ref 26.0–34.0)
MCHC: 36 g/dL (ref 30.0–36.0)
MCHC: 35.8 g/dL (ref 30.0–36.0)
Platelets: 132 10*3/uL — ABNORMAL LOW (ref 150–400)
RDW: 13.7 % (ref 11.5–15.5)
WBC: 9.9 10*3/uL (ref 4.0–10.5)

## 2011-08-03 LAB — PLATELET COUNT: Platelets: 165 10*3/uL (ref 150–400)

## 2011-08-03 LAB — HEMOGLOBIN AND HEMATOCRIT, BLOOD
HCT: 23 % — ABNORMAL LOW (ref 39.0–52.0)
Hemoglobin: 8.5 g/dL — ABNORMAL LOW (ref 13.0–17.0)

## 2011-08-03 LAB — MAGNESIUM: Magnesium: 2.6 mg/dL — ABNORMAL HIGH (ref 1.5–2.5)

## 2011-08-03 LAB — PROTIME-INR
INR: 1.35 (ref 0.00–1.49)
Prothrombin Time: 16.9 seconds — ABNORMAL HIGH (ref 11.6–15.2)

## 2011-08-03 SURGERY — CORONARY ARTERY BYPASS GRAFTING (CABG)
Anesthesia: General | Site: Chest | Wound class: Clean

## 2011-08-03 MED ORDER — LACTATED RINGERS IV SOLN
INTRAVENOUS | Status: DC
  Administered 2011-08-03 (×2): via INTRAVENOUS

## 2011-08-03 MED ORDER — INSULIN REGULAR BOLUS VIA INFUSION
0.0000 [IU] | Freq: Three times a day (TID) | INTRAVENOUS | Status: AC
  Filled 2011-08-03: qty 10

## 2011-08-03 MED ORDER — SODIUM CHLORIDE 0.9 % IJ SOLN
3.0000 mL | Freq: Two times a day (BID) | INTRAMUSCULAR | Status: DC
  Administered 2011-08-04 (×2): 3 mL via INTRAVENOUS

## 2011-08-03 MED ORDER — MIDAZOLAM HCL 5 MG/5ML IJ SOLN
INTRAMUSCULAR | Status: DC | PRN
  Administered 2011-08-03 (×2): 2 mg via INTRAVENOUS
  Administered 2011-08-03: 6 mg via INTRAVENOUS

## 2011-08-03 MED ORDER — LACTATED RINGERS IV SOLN: 500.0000 mL | Freq: Once | INTRAVENOUS | Status: AC | PRN

## 2011-08-03 MED ORDER — ONDANSETRON HCL 4 MG/2ML IJ SOLN
4.0000 mg | Freq: Four times a day (QID) | INTRAMUSCULAR | Status: DC | PRN
  Administered 2011-08-03 – 2011-08-04 (×2): 4 mg via INTRAVENOUS
  Filled 2011-08-03 (×2): qty 2

## 2011-08-03 MED ORDER — MORPHINE SULFATE 4 MG/ML IJ SOLN
2.0000 mg | INTRAMUSCULAR | Status: DC | PRN
  Administered 2011-08-03 (×2): 2 mg via INTRAVENOUS
  Filled 2011-08-03: qty 1

## 2011-08-03 MED ORDER — ACETAMINOPHEN 650 MG RE SUPP
650.0000 mg | RECTAL | Status: AC
  Administered 2011-08-03: 650 mg via RECTAL

## 2011-08-03 MED ORDER — DEXTROSE 5 % IV SOLN
1.5000 g | Freq: Two times a day (BID) | INTRAVENOUS | Status: DC
  Administered 2011-08-03 – 2011-08-04 (×3): 1.5 g via INTRAVENOUS
  Filled 2011-08-03 (×4): qty 1.5

## 2011-08-03 MED ORDER — ROCURONIUM BROMIDE 100 MG/10ML IV SOLN
INTRAVENOUS | Status: DC | PRN
  Administered 2011-08-03 (×2): 30 mg via INTRAVENOUS
  Administered 2011-08-03: 20 mg via INTRAVENOUS
  Administered 2011-08-03: 100 mg via INTRAVENOUS

## 2011-08-03 MED ORDER — THROMBIN 20000 UNITS EX KIT: PACK | CUTANEOUS | Status: DC | PRN

## 2011-08-03 MED ORDER — PHENYLEPHRINE HCL 10 MG/ML IJ SOLN
0.0000 ug/min | INTRAMUSCULAR | Status: DC
  Administered 2011-08-04: 20 ug/min via INTRAVENOUS
  Filled 2011-08-03 (×2): qty 2

## 2011-08-03 MED ORDER — ASPIRIN EC 325 MG PO TBEC
325.0000 mg | DELAYED_RELEASE_TABLET | Freq: Every day | ORAL | Status: DC
  Administered 2011-08-04: 325 mg via ORAL
  Filled 2011-08-03 (×2): qty 1

## 2011-08-03 MED ORDER — ROSUVASTATIN CALCIUM 40 MG PO TABS
40.0000 mg | ORAL_TABLET | Freq: Every day | ORAL | Status: DC
  Administered 2011-08-04 – 2011-08-06 (×3): 40 mg via ORAL
  Filled 2011-08-03 (×5): qty 1

## 2011-08-03 MED ORDER — BISACODYL 5 MG PO TBEC
10.0000 mg | DELAYED_RELEASE_TABLET | Freq: Every day | ORAL | Status: DC
  Administered 2011-08-04: 10 mg via ORAL
  Filled 2011-08-03: qty 2

## 2011-08-03 MED ORDER — VANCOMYCIN HCL 1000 MG IV SOLR
1000.0000 mg | Freq: Once | INTRAVENOUS | Status: AC
  Administered 2011-08-03: 1000 mg via INTRAVENOUS
  Filled 2011-08-03: qty 1000

## 2011-08-03 MED ORDER — PROPOFOL 10 MG/ML IV EMUL
INTRAVENOUS | Status: DC | PRN
  Administered 2011-08-03: 100 mg via INTRAVENOUS

## 2011-08-03 MED ORDER — MIDAZOLAM HCL 2 MG/2ML IJ SOLN: 2.0000 mg | INTRAMUSCULAR | Status: DC | PRN

## 2011-08-03 MED ORDER — SODIUM CHLORIDE 0.9 % IV SOLN
0.1000 ug/kg/h | INTRAVENOUS | Status: DC
  Filled 2011-08-03: qty 2

## 2011-08-03 MED ORDER — DOCUSATE SODIUM 100 MG PO CAPS
200.0000 mg | ORAL_CAPSULE | Freq: Every day | ORAL | Status: DC
  Administered 2011-08-04: 200 mg via ORAL
  Filled 2011-08-03: qty 2

## 2011-08-03 MED ORDER — SODIUM CHLORIDE 0.45 % IV SOLN
INTRAVENOUS | Status: DC
  Administered 2011-08-03: 14:00:00 via INTRAVENOUS

## 2011-08-03 MED ORDER — SODIUM CHLORIDE 0.9 % IV SOLN
INTRAVENOUS | Status: AC
  Administered 2011-08-03: 2.5 [IU]/h via INTRAVENOUS
  Administered 2011-08-04: 2.3 [IU]/h via INTRAVENOUS
  Filled 2011-08-03: qty 1

## 2011-08-03 MED ORDER — THROMBIN 20000 UNITS EX KIT
PACK | OROMUCOSAL | Status: DC | PRN
  Administered 2011-08-03: 10:00:00 via TOPICAL

## 2011-08-03 MED ORDER — HEPARIN SODIUM (PORCINE) 1000 UNIT/ML IJ SOLN
INTRAMUSCULAR | Status: DC | PRN
  Administered 2011-08-03: 33000 [IU] via INTRAVENOUS

## 2011-08-03 MED ORDER — 0.9 % SODIUM CHLORIDE (POUR BTL) OPTIME
TOPICAL | Status: DC | PRN
  Administered 2011-08-03: 1000 mL

## 2011-08-03 MED ORDER — ACETAMINOPHEN 160 MG/5ML PO SOLN: 650.0000 mg | ORAL | Status: AC

## 2011-08-03 MED ORDER — ALBUMIN HUMAN 5 % IV SOLN
INTRAVENOUS | Status: DC | PRN
  Administered 2011-08-03: 12:00:00 via INTRAVENOUS

## 2011-08-03 MED ORDER — METOPROLOL TARTRATE 12.5 MG HALF TABLET
12.5000 mg | ORAL_TABLET | Freq: Two times a day (BID) | ORAL | Status: DC
  Filled 2011-08-03 (×3): qty 1

## 2011-08-03 MED ORDER — METOPROLOL TARTRATE 1 MG/ML IV SOLN: 2.5000 mg | INTRAVENOUS | Status: DC | PRN

## 2011-08-03 MED ORDER — ACETAMINOPHEN 500 MG PO TABS
1000.0000 mg | ORAL_TABLET | Freq: Four times a day (QID) | ORAL | Status: DC
  Administered 2011-08-04 (×3): 1000 mg via ORAL
  Filled 2011-08-03 (×5): qty 2

## 2011-08-03 MED ORDER — OXYCODONE HCL 5 MG PO TABS
5.0000 mg | ORAL_TABLET | ORAL | Status: DC | PRN
  Administered 2011-08-04 (×2): 5 mg via ORAL
  Administered 2011-08-04 – 2011-08-05 (×2): 10 mg via ORAL
  Administered 2011-08-05: 5 mg via ORAL
  Filled 2011-08-03: qty 1
  Filled 2011-08-03: qty 2
  Filled 2011-08-03 (×2): qty 1
  Filled 2011-08-03: qty 2

## 2011-08-03 MED ORDER — PANTOPRAZOLE SODIUM 40 MG PO TBEC: 40.0000 mg | DELAYED_RELEASE_TABLET | Freq: Every day | ORAL | Status: DC

## 2011-08-03 MED ORDER — ASPIRIN 81 MG PO CHEW: 324.0000 mg | CHEWABLE_TABLET | Freq: Every day | ORAL | Status: DC

## 2011-08-03 MED ORDER — POTASSIUM CHLORIDE 10 MEQ/50ML IV SOLN
10.0000 meq | INTRAVENOUS | Status: AC
  Administered 2011-08-03 (×3): 10 meq via INTRAVENOUS

## 2011-08-03 MED ORDER — FENTANYL CITRATE 0.05 MG/ML IJ SOLN
INTRAMUSCULAR | Status: DC | PRN
  Administered 2011-08-03: 150 ug via INTRAVENOUS
  Administered 2011-08-03: 350 ug via INTRAVENOUS
  Administered 2011-08-03: 250 ug via INTRAVENOUS
  Administered 2011-08-03: 500 ug via INTRAVENOUS
  Administered 2011-08-03: 250 ug via INTRAVENOUS

## 2011-08-03 MED ORDER — SODIUM CHLORIDE 0.9 % IV SOLN: INTRAVENOUS | Status: DC

## 2011-08-03 MED ORDER — SODIUM CHLORIDE 0.9 % IJ SOLN: 3.0000 mL | INTRAMUSCULAR | Status: DC | PRN

## 2011-08-03 MED ORDER — BISACODYL 10 MG RE SUPP: 10.0000 mg | Freq: Every day | RECTAL | Status: DC

## 2011-08-03 MED ORDER — ACETAMINOPHEN 160 MG/5ML PO SOLN
975.0000 mg | Freq: Four times a day (QID) | ORAL | Status: DC
  Filled 2011-08-03: qty 40.6

## 2011-08-03 MED ORDER — MORPHINE SULFATE 2 MG/ML IJ SOLN
1.0000 mg | INTRAMUSCULAR | Status: AC | PRN
  Administered 2011-08-03: 2 mg via INTRAVENOUS
  Filled 2011-08-03: qty 1

## 2011-08-03 MED ORDER — LACTATED RINGERS IV SOLN
INTRAVENOUS | Status: DC | PRN
  Administered 2011-08-03 (×4): via INTRAVENOUS

## 2011-08-03 MED ORDER — FAMOTIDINE IN NACL 20-0.9 MG/50ML-% IV SOLN
20.0000 mg | Freq: Two times a day (BID) | INTRAVENOUS | Status: AC
  Administered 2011-08-03: 20 mg via INTRAVENOUS

## 2011-08-03 MED ORDER — NITROGLYCERIN IN D5W 200-5 MCG/ML-% IV SOLN: 0.0000 ug/min | INTRAVENOUS | Status: DC

## 2011-08-03 MED ORDER — MICROFIBRILLAR COLL HEMOSTAT EX PADS
MEDICATED_PAD | CUTANEOUS | Status: DC | PRN
  Administered 2011-08-03: 1 via TOPICAL

## 2011-08-03 MED ORDER — METOCLOPRAMIDE HCL 5 MG/ML IJ SOLN
10.0000 mg | Freq: Four times a day (QID) | INTRAMUSCULAR | Status: DC
  Administered 2011-08-03 – 2011-08-04 (×2): 10 mg via INTRAVENOUS
  Filled 2011-08-03 (×4): qty 2

## 2011-08-03 MED ORDER — PROTAMINE SULFATE 10 MG/ML IV SOLN
INTRAVENOUS | Status: DC | PRN
  Administered 2011-08-03: 300 mg via INTRAVENOUS

## 2011-08-03 MED ORDER — MAGNESIUM SULFATE 40 MG/ML IJ SOLN
4.0000 g | Freq: Once | INTRAMUSCULAR | Status: AC
Start: 2011-08-03 — End: 2011-08-03
  Administered 2011-08-03: 4 g via INTRAVENOUS
  Filled 2011-08-03: qty 100

## 2011-08-03 MED ORDER — METOPROLOL TARTRATE 25 MG/10 ML ORAL SUSPENSION
12.5000 mg | Freq: Two times a day (BID) | ORAL | Status: DC
  Filled 2011-08-03 (×3): qty 5

## 2011-08-03 MED ORDER — EPHEDRINE SULFATE 50 MG/ML IJ SOLN
INTRAMUSCULAR | Status: DC | PRN
  Administered 2011-08-03: 10 mg via INTRAVENOUS

## 2011-08-03 MED ORDER — DEXTROSE 5 % IV SOLN
0.7500 g | INTRAVENOUS | Status: DC | PRN
  Administered 2011-08-03: .75 g via INTRAVENOUS

## 2011-08-03 MED ORDER — ALBUMIN HUMAN 5 % IV SOLN
250.0000 mL | INTRAVENOUS | Status: AC | PRN
  Administered 2011-08-03: 250 mL via INTRAVENOUS

## 2011-08-03 MED ORDER — SODIUM CHLORIDE 0.9 % IV SOLN
250.0000 mL | INTRAVENOUS | Status: DC
  Administered 2011-08-04: 250 mL via INTRAVENOUS

## 2011-08-03 SURGICAL SUPPLY — 113 items
ADAPTER CARDIO PERF ANTE/RETRO (ADAPTER) IMPLANT
ATTRACTOMAT 16X20 MAGNETIC DRP (DRAPES) ×2 IMPLANT
BAG DECANTER FOR FLEXI CONT (MISCELLANEOUS) ×2 IMPLANT
BANDAGE ACE 4 STERILE (GAUZE/BANDAGES/DRESSINGS) ×2 IMPLANT
BANDAGE ELASTIC 4 VELCRO ST LF (GAUZE/BANDAGES/DRESSINGS) ×2 IMPLANT
BANDAGE ELASTIC 6 VELCRO ST LF (GAUZE/BANDAGES/DRESSINGS) ×2 IMPLANT
BANDAGE GAUZE 4  KLING STR (GAUZE/BANDAGES/DRESSINGS) ×2 IMPLANT
BANDAGE GAUZE ELAST BULKY 4 IN (GAUZE/BANDAGES/DRESSINGS) ×2 IMPLANT
BASKET HEART (ORDER IN 25'S) (MISCELLANEOUS) ×1
BASKET HEART (ORDER IN 25S) (MISCELLANEOUS) ×1 IMPLANT
BENZOIN TINCTURE PRP APPL 2/3 (GAUZE/BANDAGES/DRESSINGS) ×2 IMPLANT
BLADE SAW STERNAL (BLADE) ×2 IMPLANT
BLADE SURG ROTATE 9660 (MISCELLANEOUS) IMPLANT
CANISTER SUCTION 2500CC (MISCELLANEOUS) ×2 IMPLANT
CANNULA ARTERIAL NVNT 3/8 22FR (MISCELLANEOUS) ×2 IMPLANT
CANNULA GUNDRY RCSP 15FR (MISCELLANEOUS) IMPLANT
CATH ROBINSON RED A/P 18FR (CATHETERS) ×4 IMPLANT
CATH THORACIC 28FR (CATHETERS) ×2 IMPLANT
CATH THORACIC 28FR RT ANG (CATHETERS) IMPLANT
CATH THORACIC 36FR (CATHETERS) ×2 IMPLANT
CATH THORACIC 36FR RT ANG (CATHETERS) ×2 IMPLANT
CLIP FOGARTY SPRING 6M (CLIP) IMPLANT
CLIP TI MEDIUM 24 (CLIP) IMPLANT
CLIP TI WIDE RED SMALL 24 (CLIP) IMPLANT
CLOSURE STERI STRIP 1/2 X4 (GAUZE/BANDAGES/DRESSINGS) ×2 IMPLANT
CLOTH BEACON ORANGE TIMEOUT ST (SAFETY) ×2 IMPLANT
COVER SURGICAL LIGHT HANDLE (MISCELLANEOUS) ×4 IMPLANT
CRADLE DONUT ADULT HEAD (MISCELLANEOUS) ×2 IMPLANT
DRAPE CARDIOVASCULAR INCISE (DRAPES) ×1
DRAPE SLUSH MACHINE 52X66 (DRAPES) ×2 IMPLANT
DRAPE SLUSH/WARMER DISC (DRAPES) IMPLANT
DRAPE SRG 135X102X78XABS (DRAPES) ×1 IMPLANT
DRSG COVADERM 4X14 (GAUZE/BANDAGES/DRESSINGS) ×2 IMPLANT
ELECT CAUTERY BLADE 6.4 (BLADE) ×2 IMPLANT
ELECT REM PT RETURN 9FT ADLT (ELECTROSURGICAL) ×4
ELECTRODE REM PT RTRN 9FT ADLT (ELECTROSURGICAL) ×2 IMPLANT
GAUZE SPONGE 4X4 12PLY STRL LF (GAUZE/BANDAGES/DRESSINGS) ×4 IMPLANT
GLOVE BIO SURGEON STRL SZ 6 (GLOVE) ×4 IMPLANT
GLOVE BIO SURGEON STRL SZ 6.5 (GLOVE) ×6 IMPLANT
GLOVE BIO SURGEON STRL SZ7 (GLOVE) ×6 IMPLANT
GLOVE BIO SURGEON STRL SZ7.5 (GLOVE) IMPLANT
GLOVE BIOGEL PI IND STRL 6 (GLOVE) IMPLANT
GLOVE BIOGEL PI IND STRL 6.5 (GLOVE) ×2 IMPLANT
GLOVE BIOGEL PI IND STRL 7.0 (GLOVE) IMPLANT
GLOVE BIOGEL PI INDICATOR 6 (GLOVE)
GLOVE BIOGEL PI INDICATOR 6.5 (GLOVE) ×2
GLOVE BIOGEL PI INDICATOR 7.0 (GLOVE)
GLOVE EUDERMIC 7 POWDERFREE (GLOVE) ×4 IMPLANT
GLOVE ORTHO TXT STRL SZ7.5 (GLOVE) IMPLANT
GOWN PREVENTION PLUS XLARGE (GOWN DISPOSABLE) ×2 IMPLANT
GOWN STRL NON-REIN LRG LVL3 (GOWN DISPOSABLE) ×8 IMPLANT
HEMOSTAT POWDER SURGIFOAM 1G (HEMOSTASIS) ×4 IMPLANT
HEMOSTAT SURGICEL 2X14 (HEMOSTASIS) ×2 IMPLANT
INSERT FOGARTY 61MM (MISCELLANEOUS) IMPLANT
INSERT FOGARTY XLG (MISCELLANEOUS) IMPLANT
KIT BASIN OR (CUSTOM PROCEDURE TRAY) ×2 IMPLANT
KIT CATH CPB BARTLE (MISCELLANEOUS) ×2 IMPLANT
KIT ROOM TURNOVER OR (KITS) ×2 IMPLANT
KIT SUCTION CATH 14FR (SUCTIONS) ×2 IMPLANT
KIT VASOVIEW W/TROCAR VH 2000 (KITS) ×2 IMPLANT
LINE VENT (MISCELLANEOUS) ×2 IMPLANT
NS IRRIG 1000ML POUR BTL (IV SOLUTION) ×10 IMPLANT
PACK OPEN HEART (CUSTOM PROCEDURE TRAY) ×2 IMPLANT
PAD ARMBOARD 7.5X6 YLW CONV (MISCELLANEOUS) ×4 IMPLANT
PENCIL BUTTON HOLSTER BLD 10FT (ELECTRODE) ×2 IMPLANT
PUNCH AORTIC ROTATE 4.0MM (MISCELLANEOUS) IMPLANT
PUNCH AORTIC ROTATE 4.5MM 8IN (MISCELLANEOUS) ×2 IMPLANT
PUNCH AORTIC ROTATE 5MM 8IN (MISCELLANEOUS) IMPLANT
SET CARDIOPLEGIA MPS 5001102 (MISCELLANEOUS) ×2 IMPLANT
SOLUTION ANTI FOG 6CC (MISCELLANEOUS) IMPLANT
SPONGE GAUZE 4X4 12PLY (GAUZE/BANDAGES/DRESSINGS) ×4 IMPLANT
SPONGE INTESTINAL PEANUT (DISPOSABLE) IMPLANT
SPONGE LAP 18X18 X RAY DECT (DISPOSABLE) ×4 IMPLANT
SPONGE LAP 4X18 X RAY DECT (DISPOSABLE) ×2 IMPLANT
STRIP CLOSURE SKIN 1/2X4 (GAUZE/BANDAGES/DRESSINGS) ×2 IMPLANT
SUT BONE WAX W31G (SUTURE) ×2 IMPLANT
SUT MNCRL AB 4-0 PS2 18 (SUTURE) IMPLANT
SUT PROLENE 3 0 SH DA (SUTURE) IMPLANT
SUT PROLENE 3 0 SH1 36 (SUTURE) IMPLANT
SUT PROLENE 4 0 RB 1 (SUTURE)
SUT PROLENE 4 0 SH DA (SUTURE) IMPLANT
SUT PROLENE 4-0 RB1 .5 CRCL 36 (SUTURE) IMPLANT
SUT PROLENE 5 0 C 1 36 (SUTURE) ×2 IMPLANT
SUT PROLENE 6 0 C 1 30 (SUTURE) ×4 IMPLANT
SUT PROLENE 6 0 CC (SUTURE) IMPLANT
SUT PROLENE 7 0 BV 1 (SUTURE) IMPLANT
SUT PROLENE 7 0 BV1 MDA (SUTURE) ×4 IMPLANT
SUT PROLENE 7.0 RB 3 (SUTURE) ×2 IMPLANT
SUT PROLENE 8 0 BV175 6 (SUTURE) ×2 IMPLANT
SUT SILK  1 MH (SUTURE)
SUT SILK 1 MH (SUTURE) IMPLANT
SUT SILK 2 0 SH CR/8 (SUTURE) IMPLANT
SUT SILK 3 0 SH CR/8 (SUTURE) IMPLANT
SUT STEEL STERNAL CCS#1 18IN (SUTURE) ×2 IMPLANT
SUT STEEL SZ 6 DBL 3X14 BALL (SUTURE) IMPLANT
SUT VIC AB 1 CTX 36 (SUTURE) ×3
SUT VIC AB 1 CTX36XBRD ANBCTR (SUTURE) ×3 IMPLANT
SUT VIC AB 2-0 CT1 27 (SUTURE)
SUT VIC AB 2-0 CT1 TAPERPNT 27 (SUTURE) IMPLANT
SUT VIC AB 2-0 CTX 27 (SUTURE) IMPLANT
SUT VIC AB 3-0 SH 27 (SUTURE)
SUT VIC AB 3-0 SH 27X BRD (SUTURE) IMPLANT
SUT VIC AB 3-0 X1 27 (SUTURE) IMPLANT
SUT VICRYL 4-0 PS2 18IN ABS (SUTURE) IMPLANT
SUTURE E-PAK OPEN HEART (SUTURE) ×2 IMPLANT
SYSTEM SAHARA CHEST DRAIN ATS (WOUND CARE) ×2 IMPLANT
TOWEL OR 17X24 6PK STRL BLUE (TOWEL DISPOSABLE) ×4 IMPLANT
TOWEL OR 17X26 10 PK STRL BLUE (TOWEL DISPOSABLE) ×4 IMPLANT
TRAY FOLEY IC TEMP SENS 14FR (CATHETERS) ×2 IMPLANT
TUBE SUCT INTRACARD DLP 20F (MISCELLANEOUS) ×2 IMPLANT
TUBING INSUFFLATION 10FT LAP (TUBING) ×2 IMPLANT
UNDERPAD 30X30 INCONTINENT (UNDERPADS AND DIAPERS) ×2 IMPLANT
WATER STERILE IRR 1000ML POUR (IV SOLUTION) ×4 IMPLANT

## 2011-08-03 NOTE — Anesthesia Postprocedure Evaluation (Signed)
  Anesthesia Post-op Note  Patient: [name] [name]  Procedure(s) Performed:  CORONARY ARTERY BYPASS GRAFTING (CABG) - CABG times three using right saphenous vein and left mammary artery usisng endoscope.  Patient Location: SICU  Anesthesia Type: General  Level of Consciousness: sedated  Airway and Oxygen Therapy: Patient remains intubated per anesthesia plan  Post-op Pain: none  Post-op Assessment: Post-op Vital signs reviewed, Patient's Cardiovascular Status Stable, Respiratory Function Stable and Patent Airway  Post-op Vital Signs: Reviewed and stable  Complications: No apparent anesthesia complications

## 2011-08-03 NOTE — Procedures (Signed)
Extubation Procedure Note  Patient Details:   Name: [name] [name] DOB: 1945/09/25 MRN: [id number]   Airway Documentation:  Airway 8 mm (Active)    Evaluation  O2 sats: stable throughout Complications: No apparent complications Patient did tolerate procedure well. Bilateral Breath Sounds: Clear   Yes Pt had NIF 44 and VC 2195cc's. Extubated and pt now wearing [place] @ 4lpm. Clearance Coots 08/03/2011, 4:43 PM

## 2011-08-03 NOTE — Progress Notes (Signed)
Patient ID: [name] [name], male   DOB: February 21, 1946, 66 y.o.   MRN: [id number]   Filed Vitals:   08/03/11 1830 08/03/11 1845 08/03/11 1900 08/03/11 1915  BP:   95/47   Pulse:  80 81 85  Temp: 97.9 F (36.6 C) 98.1 F (36.7 C) 98.1 F (36.7 C) 98.1 F (36.7 C)  TempSrc:      Resp: 16 0 16 15  Weight:      SpO2: 100% 99% 99% 100%   CI=2.3  Extubated, alert  CT output low UO good.  BMET    Component Value Date/Time   NA 141 08/03/2011 2022   K 4.0 08/03/2011 2022   CL 108 08/03/2011 2022   CO2 23 07/30/2011 1212   GLUCOSE 118* 08/03/2011 2022   BUN 14 08/03/2011 2022   CREATININE 0.60 08/03/2011 2022   CALCIUM 9.1 07/30/2011 1212   GFRNONAA >90 08/03/2011 2000   GFRAA >90 08/03/2011 2000    CBC    Component Value Date/Time   WBC 14.6* 08/03/2011 2000   RBC 3.58* 08/03/2011 2000   HGB 9.5* 08/03/2011 2022   HCT 28.0* 08/03/2011 2022   PLT 180 08/03/2011 2000   MCV 84.4 08/03/2011 2000   MCH 30.2 08/03/2011 2000   MCHC 35.8 08/03/2011 2000   RDW 13.7 08/03/2011 2000    A/P: stable postop

## 2011-08-03 NOTE — Brief Op Note (Signed)
08/03/2011  11:06 AM  PATIENT:  [name] [name]  66 y.o. male  PRE-OPERATIVE DIAGNOSIS:  CAD  POST-OPERATIVE DIAGNOSIS:  CAD  PROCEDURE:  Procedure(s): CORONARY ARTERY BYPASS GRAFTING (CABG) x 3 (LIMA-LAD, SVG-PD, SVG-PL), EVH Rt Leg  SURGEON:  Surgeon(s): Alleen Borne, MD  ASSISTANT: Coral Ceo, PA-C  ANESTHESIA:   general  PATIENT CONDITION:  ICU - intubated and hemodynamically stable.  PRE-OPERATIVE WEIGHT: 108 kg

## 2011-08-03 NOTE — Plan of Care (Signed)
Problem: Phase II Progression Outcomes Goal: Patient extubated within - Outcome: Completed/Met Date Met:  08/03/11 Extubated within 6 hours at 1643pm

## 2011-08-03 NOTE — Progress Notes (Signed)
Dr. Jacklynn Bue notified of CBG 283. No orders received.

## 2011-08-03 NOTE — Interval H&P Note (Signed)
History and Physical Interval Note:  08/03/2011 7:33 AM  [name] [name]  has presented today for surgery, with the diagnosis of CAD  The various methods of treatment have been discussed with the patient and family. After consideration of risks, benefits and other options for treatment, the patient has consented to  Procedure(s): CORONARY ARTERY BYPASS GRAFTING (CABG) as a surgical intervention .  The patients' history has been reviewed, patient examined, no change in status, stable for surgery.  I have reviewed the patients' chart and labs.  Questions were answered to the patient's satisfaction.     Alleen Borne

## 2011-08-03 NOTE — Anesthesia Preprocedure Evaluation (Addendum)
Anesthesia Evaluation  Patient identified by MRN, date of birth, ID band Patient awake    Reviewed: Allergy & Precautions, H&P , NPO status , Patient's Chart, lab work & pertinent test results, reviewed documented beta blocker date and time   Airway Mallampati: II TM Distance: >3 FB     Dental  (+) Teeth Intact   Pulmonary sleep apnea ,  clear to auscultation  Pulmonary exam normal       Cardiovascular hypertension, Pt. on home beta blockers + CAD and +CHF + dysrhythmias Regular Normal- Systolic murmurs    Neuro/Psych CVA, No Residual Symptoms    GI/Hepatic negative GI ROS, Neg liver ROS,   Endo/Other  Diabetes mellitus-, Poorly Controlled, Type 2  Renal/GU negative Renal ROS     Musculoskeletal   Abdominal   Peds  Hematology   Anesthesia Other Findings   Reproductive/Obstetrics                         Anesthesia Physical Anesthesia Plan  ASA: IV  Anesthesia Plan: General   Post-op Pain Management:    Induction: Intravenous  Airway Management Planned: Oral ETT  Additional Equipment: PA Cath, CVP and Arterial line  Intra-op Plan:   Post-operative Plan: Post-operative intubation/ventilation  Informed Consent: I have reviewed the patients History and Physical, chart, labs and discussed the procedure including the risks, benefits and alternatives for the proposed anesthesia with the patient or authorized representative who has indicated his/her understanding and acceptance.   Dental advisory given  Plan Discussed with: CRNA, Anesthesiologist and Surgeon  Anesthesia Plan Comments:         Anesthesia Quick Evaluation

## 2011-08-03 NOTE — Preoperative (Signed)
Beta Blockers   Reason not to administer Beta Blockers:Not Applicable 

## 2011-08-03 NOTE — Transfer of Care (Signed)
Immediate Anesthesia Transfer of Care Note  Patient: [name] [name]  Procedure(s) Performed:  CORONARY ARTERY BYPASS GRAFTING (CABG) - CABG times three using right saphenous vein and left mammary artery usisng endoscope.  Patient Location: SICU  Anesthesia Type: General  Level of Consciousness: sedated  Airway & Oxygen Therapy: Patient remains intubated per anesthesia plan  Post-op Assessment: Post -op Vital signs reviewed and stable  Post vital signs: Reviewed and stable  Complications: No apparent anesthesia complications

## 2011-08-03 NOTE — H&P (Signed)
[address]            [place] [address]          [phone]     PCP is Fara Chute, MD  Referring Provider is Governor Rooks, MD  Chief Complaint   Patient presents with   .  Cardiomyopathy     eval for cabg   .  Coronary Artery Disease   HPI:  Patient is a 66 yo gentleman with diabetes and coronary artery disease with known cardiomyopathy and EF 25% who has been treated medically. He presented with congestive heart failure symptoms and medications were titrated upward with initial response. In December he went to [employer] and felt weak and fatigued and developed significant orthostatic hypotension when he arrived home. He was felt to be dehydrated and medications were decreased. He had a syncopal episode when he went for lab draw and went to [facility], where he ruled out for MI. Since then he has improved but continues to have significant exercise intolerance. Most recent cath was performed 01/2011 to see if he had an ischemic component to his cardiomyopathy. This showed significant multivessel disease with EF of 30-35%. His persantine stress test in 09/2010 showed mild perfusion defect in the Apical region with no inducible ischemia and EF of 35%. 2D echo 12/2010 showed EF 35-40% with mild to moderate MR.  Past Medical History   Diagnosis  Date   .  Diabetes mellitus    .  Coronary artery disease    .  High cholesterol    .  Hypertension    .  Stroke, Wallenberg's syndrome    .  Right bundle branch block    .  Cardiomyopathy    .  Sleep apnea    .  CAD (coronary artery disease)  07/06/2011   .  LV dysfunction  07/06/2011    Past Surgical History   Procedure  Date   .  Cardiac catheterization  01/2011   History reviewed. No pertinent family history.  Social History  History   Substance Use Topics   .  Smoking status:  Former Games developer   .  Smokeless tobacco:  Not on file   .  Alcohol Use:  Yes      occ. use    Current Outpatient  Prescriptions   Medication  Sig  Dispense  Refill   .  aspirin EC 81 MG tablet  Take 81 mg by mouth daily.     .  carvedilol (COREG) 6.25 MG tablet  Take 6.25 mg by mouth 2 (two) times daily with a meal. Take 1 tablets in the morning and one in the evening.     Marland Kitchen  doxazosin (CARDURA) 4 MG tablet  Take 4 mg by mouth at bedtime.     .  furosemide (LASIX) 20 MG tablet  Take 2 tablets (40 mg total) by mouth daily. Resume after 3 days     .  glipiZIDE (GLUCOTROL XL) 10 MG 24 hr tablet  Take 10 mg by mouth daily.     Marland Kitchen  ibuprofen (ADVIL,MOTRIN) 200 MG tablet  Take 400 mg by mouth every 6 (six) hours as needed. Pain     .  isosorbide mononitrate (IMDUR) 60 MG 24 hr tablet  Take 0.5 tablets (30 mg total) by mouth at bedtime.     .  metFORMIN (  GLUMETZA) 500 MG (MOD) 24 hr tablet  Take 500 mg by mouth 2 (two) times daily.     .  Multiple Vitamins-Minerals (ONE-A-DAY 50 PLUS PO)  Take 1 tablet by mouth daily.     .  ramipril (ALTACE) 5 MG capsule  Take 2.5 mg by mouth at bedtime. Resume after 3 days     .  spironolactone (ALDACTONE) 25 MG tablet  Take 12.5 mg by mouth 2 (two) times daily.     .  rosuvastatin (CRESTOR) 40 MG tablet  Take 40 mg by mouth at bedtime.     No Known Allergies  Review of Systems  Constitutional: Positive for fatigue. Negative for fever, chills and unexpected weight change.  HENT: Negative.  Eyes: Negative.  Respiratory: Positive for shortness of breath.  Cardiovascular: Negative for chest pain, palpitations and leg swelling.  Gastrointestinal: Negative.  Genitourinary: Negative.  Musculoskeletal: Negative.  Neurological: Negative.  Hematological: Negative.  Psychiatric/Behavioral: Negative.  BP 128/68  Pulse 68  Resp 16  Ht 6\' 2"  (1.88 m)  Wt 234 lb (106.142 kg)  BMI 30.04 kg/m2  SpO2 98%  Physical Exam  Constitutional: He is oriented to person, place, and time. He appears well-developed and well-nourished.  HENT:  Head: Normocephalic and atraumatic.    Mouth/Throat: Oropharynx is clear and moist.  Eyes: EOM are normal. Pupils are equal, round, and reactive to light.  Neck: Neck supple. No JVD present. No thyromegaly present.  Cardiovascular: Normal rate and regular rhythm. Exam reveals no gallop and no friction rub.  Murmur heard.  Pulmonary/Chest: Effort normal and breath sounds normal.  Abdominal: Soft. Bowel sounds are normal. He exhibits no mass. There is no tenderness.  Musculoskeletal: He exhibits no edema and no tenderness.  Lymphadenopathy:  He has no cervical adenopathy.  Neurological: He is alert and oriented to person, place, and time. He has normal strength. No cranial nerve deficit or sensory deficit.  Skin: Skin is warm and dry.  Psychiatric: He has a normal mood and affect.  Diagnostic Tests:  HEMODYNAMIC DATA: Right atrial mean pressure 8, A-wave 12. Right  ventricular pressure 48/8. Pulmonary pressure 48/22, mean pulmonary  capillary wedge pressure 16.  Central aortic pressure 159/73. Left ventricular pressure 159/13, post  A-wave 24.  O2 saturation 96%, PA saturation 71%. Cardiac output by the  thermodilution method was 5.9 and by the Fick method was 6.6 L/min.  Cardiac index was 2.5 and 2.9 L/min/m2 respectively.  Left ventriculography: RAO ventriculography revealed dilated left  ventricle with severe global hypocontractility. Ejection fraction is in  the 30-35% range.  Distal aortography did not demonstrate any renal artery stenosis. There  was mild irregularity of the aorta without significant stenoses.  Fluoroscopy of the coronary arteries revealed coronary calcification.  The left main coronary artery bifurcated into the LAD and left  circumflex system.  The LAD had evidence for coronary calcification. After the first  diagonal vessel, the LAD was diffusely narrowed of approximately 50-60%  diffusely, although the RAO cranial view suggested perhaps this was  tighter. In the mid distal LAD, there was focal  80% stenosis. These  lesions did improve slightly following IC nitroglycerin administration.  The circumflex vessel had smooth ostial narrowing of 20-30%. There was  mild luminal irregularity in the mid AV groove circumflex of 20%.  The right coronary artery was a large dominant vessel that was tortuous.  There was up to 70% smooth stenosis after the proximal bend before the  secondary proximal bend. There was  99% stenosis at the ostium of a  small anterior RV marginal branch. There was 30% mid RCA stenosis  followed by 50% stenosis before the acute margin and then 50-60%  stenosis before the PDA takeoff in the distal RCA. There was 70%  stenosis in the proximal portion of the PLA vessel.  Impression:  The patient has significant multivessel coronary disease with moderate left ventricular dysfunction. I think coronary bypass graft surgery is indicated to prevent further ischemia and infarction and hopefully improve his left ventricular function and quality of life. I would not plan to place an epicardial left ventricular pacing lead at this time since there is a risk of infection; if he does eventually need a biventricular pacemaker, a coronary sinus lead can be placed transvenously most of the time. I discussed the operative procedure with the patient and family including alternatives, benefits and risks; including but not limited to bleeding, blood transfusion, infection, stroke, myocardial infarction, graft failure, heart block requiring a permanent pacemaker, organ dysfunction, and death. [name] understands and agrees to proceed. We will schedule surgery for later this month at the patient's request.   Plan:   CABG

## 2011-08-04 ENCOUNTER — Inpatient Hospital Stay (HOSPITAL_COMMUNITY): Payer: Medicare Other

## 2011-08-04 ENCOUNTER — Encounter (HOSPITAL_COMMUNITY): Payer: Self-pay | Admitting: Surgery

## 2011-08-04 DIAGNOSIS — Z951 Presence of aortocoronary bypass graft: Secondary | ICD-10-CM

## 2011-08-04 LAB — CBC
MCH: 30.1 pg (ref 26.0–34.0)
MCV: 85 fL (ref 78.0–100.0)
MCV: 85.4 fL (ref 78.0–100.0)
Platelets: 179 10*3/uL (ref 150–400)
Platelets: 166 10*3/uL (ref 150–400)
RDW: 14 % (ref 11.5–15.5)
RDW: 14.1 % (ref 11.5–15.5)
WBC: 12.4 10*3/uL — ABNORMAL HIGH (ref 4.0–10.5)

## 2011-08-04 LAB — GLUCOSE, CAPILLARY
Glucose-Capillary: 105 mg/dL — ABNORMAL HIGH (ref 70–99)
Glucose-Capillary: 113 mg/dL — ABNORMAL HIGH (ref 70–99)
Glucose-Capillary: 115 mg/dL — ABNORMAL HIGH (ref 70–99)
Glucose-Capillary: 115 mg/dL — ABNORMAL HIGH (ref 70–99)
Glucose-Capillary: 116 mg/dL — ABNORMAL HIGH (ref 70–99)
Glucose-Capillary: 117 mg/dL — ABNORMAL HIGH (ref 70–99)
Glucose-Capillary: 118 mg/dL — ABNORMAL HIGH (ref 70–99)
Glucose-Capillary: 127 mg/dL — ABNORMAL HIGH (ref 70–99)
Glucose-Capillary: 128 mg/dL — ABNORMAL HIGH (ref 70–99)
Glucose-Capillary: 137 mg/dL — ABNORMAL HIGH (ref 70–99)
Glucose-Capillary: 145 mg/dL — ABNORMAL HIGH (ref 70–99)
Glucose-Capillary: 167 mg/dL — ABNORMAL HIGH (ref 70–99)
Glucose-Capillary: 94 mg/dL (ref 70–99)

## 2011-08-04 LAB — BASIC METABOLIC PANEL
Chloride: 106 mEq/L (ref 96–112)
Creatinine, Ser: 0.57 mg/dL (ref 0.50–1.35)
GFR calc Af Amer: >90 mL/min (ref >90)
Sodium: 139 mEq/L (ref 135–145)

## 2011-08-04 LAB — POCT I-STAT, CHEM 8
Creatinine, Ser: 0.7 mg/dL (ref 0.50–1.35)
Glucose, Bld: 156 mg/dL — ABNORMAL HIGH (ref 70–99)
HCT: 43 % (ref 39.0–52.0)
Hemoglobin: 14.6 g/dL (ref 13.0–17.0)
Potassium: 3.9 mEq/L (ref 3.5–5.1)
Sodium: 139 mEq/L (ref 135–145)
TCO2: 22 mmol/L (ref 0–100)

## 2011-08-04 LAB — MAGNESIUM: Magnesium: 2.3 mg/dL (ref 1.5–2.5)

## 2011-08-04 MED ORDER — PNEUMOCOCCAL VAC POLYVALENT 25 MCG/0.5ML IJ INJ
0.5000 mL | INJECTION | INTRAMUSCULAR | Status: DC
  Filled 2011-08-04: qty 0.5

## 2011-08-04 MED ORDER — INSULIN GLARGINE 100 UNIT/ML ~~LOC~~ SOLN
40.0000 [IU] | Freq: Every day | SUBCUTANEOUS | Status: DC
  Administered 2011-08-04 – 2011-08-05 (×2): 40 [IU] via SUBCUTANEOUS
  Filled 2011-08-04: qty 3

## 2011-08-04 MED ORDER — ALBUMIN HUMAN 5 % IV SOLN
12.5000 g | Freq: Once | INTRAVENOUS | Status: AC
  Administered 2011-08-04: 12.5 g via INTRAVENOUS

## 2011-08-04 MED ORDER — ENOXAPARIN SODIUM 40 MG/0.4ML ~~LOC~~ SOLN
40.0000 mg | Freq: Every day | SUBCUTANEOUS | Status: DC
  Administered 2011-08-04 – 2011-08-06 (×3): 40 mg via SUBCUTANEOUS
  Filled 2011-08-04 (×4): qty 0.4

## 2011-08-04 MED ORDER — INSULIN ASPART 100 UNIT/ML ~~LOC~~ SOLN
0.0000 [IU] | SUBCUTANEOUS | Status: DC
Start: 2011-08-04 — End: 2011-08-05
  Administered 2011-08-04 (×3): 2 [IU] via SUBCUTANEOUS
  Administered 2011-08-04: 4 [IU] via SUBCUTANEOUS
  Administered 2011-08-05 (×3): 2 [IU] via SUBCUTANEOUS
  Filled 2011-08-04: qty 3

## 2011-08-04 NOTE — Progress Notes (Signed)
Patient ID: [name] [name], male   DOB: 1946/03/11, 66 y.o.   MRN: [id number] BP 108/49  Pulse 91  Temp(Src) 98.3 F (36.8 C) (Oral)  Resp 24  Ht 6\' 2"  (1.88 m)  Wt 236 lb 15.9 oz (107.5 kg)  BMI 30.43 kg/m2  SpO2 96% Co of dizziness Still on low dose neo  I have seen and examined [name] [name] and agree with the above assessment  and plan.  Delight Ovens MD Beeper [phone] Office [phone] 08/04/2011 7:42 PM

## 2011-08-04 NOTE — Progress Notes (Signed)
UR Completed.  [phone] 08/04/2011 [name] [name]

## 2011-08-04 NOTE — Progress Notes (Addendum)
[facility]  Subjective:  "Uncomfortable"  Objective: Vital signs in last 24 hours: Temp:  [95.2 F (35.1 C)-99.7 F (37.6 C)] 99 F (37.2 C) (02/05 0800) Pulse Rate:  [75-91] 78  (02/05 0800) Resp:  [0-23] 20  (02/05 0800) BP: (95-143)/(45-72) 95/48 mmHg (02/05 0600) SpO2:  [92 %-100 %] 99 % (02/05 0800) Arterial Line BP: (90-134)/(39-67) 95/39 mmHg (02/05 0800) FiO2 (%):  [39.8 %-50.5 %] 41.3 % (02/04 1645) Weight:  [107.5 kg (236 lb 15.9 oz)-107.7 kg (237 lb 7 oz)] 107.5 kg (236 lb 15.9 oz) (02/05 0500)    Intake/Output from previous day: 02/04 0701 - 02/05 0700 In: 5175.6 [P.O.:60; I.V.:3609.6; Blood:540; NG/GT:30; IV Piggyback:936] Out: 4140 [Urine:1890; Emesis/NG output:35; Blood:1505; Chest Tube:710] Intake/Output this shift:    Medications Current Facility-Administered Medications  Medication Dose Route Frequency Provider Last Rate Last Dose  . 0.45 % sodium chloride infusion   Intravenous Continuous Adella Hare, PA 20 mL/hr at 08/04/11 0700    . 0.9 %  sodium chloride infusion   Intravenous Continuous Adella Hare, PA      . 0.9 %  sodium chloride infusion  250 mL Intravenous Continuous Adella Hare, PA 1 mL/hr at 08/04/11 0542 250 mL at 08/04/11 0542  . acetaminophen (TYLENOL) solution 650 mg  650 mg Per Tube NOW Adella Hare, PA       Or  . acetaminophen (TYLENOL) suppository 650 mg  650 mg Rectal NOW Adella Hare, PA   650 mg at 08/03/11 1354  . acetaminophen (TYLENOL) tablet 1,000 mg  1,000 mg Oral Q6H Adella Hare, PA   1,000 mg at 08/04/11 0429   Or  . acetaminophen (TYLENOL) solution 975 mg  975 mg Per Tube Q6H Adella Hare, PA      . albumin human 5 % solution 250 mL  250 mL Intravenous Q15 min PRN Adella Hare, PA   250 mL at 08/03/11 1358  . aspirin EC tablet 325 mg  325 mg Oral Daily Adella Hare, PA       Or  . aspirin chewable tablet 324 mg  324 mg Per Tube Daily Adella Hare, PA      . bisacodyl  (DULCOLAX) EC tablet 10 mg  10 mg Oral Daily Adella Hare, PA       Or  . bisacodyl (DULCOLAX) suppository 10 mg  10 mg Rectal Daily Adella Hare, PA      . cefUROXime (ZINACEF) 1.5 g in dextrose 5 % 50 mL IVPB  1.5 g Intravenous Q12H Adella Hare, PA   1.5 g at 08/03/11 2025  . docusate sodium (COLACE) capsule 200 mg  200 mg Oral Daily Adella Hare, PA      . enoxaparin (LOVENOX) injection 40 mg  40 mg Subcutaneous QHS Alleen Borne, MD      . famotidine (PEPCID) IVPB 20 mg  20 mg Intravenous Q12H Adella Hare, PA   20 mg at 08/03/11 1355  . insulin aspart (novoLOG) injection 0-24 Units  0-24 Units Subcutaneous Q4H Alleen Borne, MD      . insulin glargine (LANTUS) injection 40 Units  40 Units Subcutaneous Daily Alleen Borne, MD      . insulin regular (NOVOLIN R,HUMULIN R) 1 Units/mL in sodium chloride 0.9 % 100 mL infusion   Intravenous Continuous Christella Hartigan, PHARMD 2.3 mL/hr at 08/04/11 0634 2.3 Units/hr at 08/04/11 0634  .  insulin regular bolus via infusion 0-10 Units  0-10 Units Intravenous TID WC Meera K Patel, PHARMD      . lactated ringers infusion 500 mL  500 mL Intravenous Once PRN Adella Hare, PA      . lactated ringers infusion   Intravenous Continuous Adella Hare, PA 20 mL/hr at 08/04/11 0700    . magnesium sulfate IVPB 4 g 100 mL  4 g Intravenous Once Adella Hare, PA   4 g at 08/03/11 1450  . metoprolol (LOPRESSOR) injection 2.5-5 mg  2.5-5 mg Intravenous Q2H PRN Adella Hare, PA      . midazolam (VERSED) injection 2 mg  2 mg Intravenous Q1H PRN Adella Hare, PA      . morphine 2 MG/ML injection 1-4 mg  1-4 mg Intravenous Q1H PRN Adella Hare, PA   2 mg at 08/03/11 1800  . morphine 4 MG/ML injection 2-5 mg  2-5 mg Intravenous Q1H PRN Adella Hare, PA   2 mg at 08/03/11 2244  . nitroglycerin/verapamil/heparin/sodium bicarbonate solution irrigation for artery spasm   Irrigation To OR Alleen Borne, MD      . ondansetron [facility]) injection 4 mg  4 mg  Intravenous Q6H PRN Adella Hare, PA   4 mg at 08/03/11 2141  . oxyCODONE (Oxy IR/ROXICODONE) immediate release tablet 5-10 mg  5-10 mg Oral Q3H PRN Adella Hare, PA   5 mg at 08/04/11 1610  . pantoprazole (PROTONIX) EC tablet 40 mg  40 mg Oral Q1200 Adella Hare, PA      . phenylephrine (NEO-SYNEPHRINE) 20,000 mcg in dextrose 5 % 250 mL infusion  0-100 mcg/min Intravenous Continuous Adella Hare, PA 15 mL/hr at 08/04/11 0700 20 mcg/min at 08/04/11 0700  . potassium chloride 10 mEq in 50 mL *CENTRAL LINE* IVPB  10 mEq Intravenous Q1 Hr x 3 Adella Hare, PA   10 mEq at 08/03/11 1621  . rosuvastatin (CRESTOR) tablet 40 mg  40 mg Oral QHS Gina H Collins, PA      . sodium chloride 0.9 % injection 3 mL  3 mL Intravenous Q12H Gina H Collins, PA      . sodium chloride 0.9 % injection 3 mL  3 mL Intravenous PRN Adella Hare, PA      . vancomycin (VANCOCIN) 1,000 mg in sodium chloride 0.9 % 100 mL IVPB  1,000 mg Intravenous Once Adella Hare, PA   1,000 mg at 08/03/11 2025  . DISCONTD: 0.9 % irrigation (POUR BTL)    PRN Alleen Borne, MD   1,000 mL at 08/03/11 1022  . DISCONTD: cefUROXime (ZINACEF) 750 mg in dextrose 5 % 50 mL IVPB  750 mg Intravenous To OR Alleen Borne, MD      . DISCONTD: dexmedetomidine (PRECEDEX) 200 mcg in sodium chloride 0.9 % 50 mL infusion  0.1-0.7 mcg/kg/hr Intravenous Continuous Adella Hare, PA   0.1 mcg/kg/hr at 08/04/11 0300  . DISCONTD: DOPamine (INTROPIN) 800 mg in dextrose 5 % 250 mL infusion  2-20 mcg/kg/min Intravenous To OR Alleen Borne, MD      . DISCONTD: EPINEPHrine (ADRENALIN) 4,000 mcg in dextrose 5 % 250 mL infusion  0.5-20 mcg/min Intravenous To OR Alleen Borne, MD      . DISCONTD: magnesium sulfate (IV Push/IM) injection 40 mEq  40 mEq Other To OR Alleen Borne, MD      . DISCONTD: metoCLOPramide (REGLAN) injection 10  mg  10 mg Intravenous Q6H Alleen Borne, MD   10 mg at 08/04/11 0542  . DISCONTD: metoprolol tartrate (LOPRESSOR) 25 mg/10 mL  oral suspension 12.5 mg  12.5 mg Per Tube BID Adella Hare, PA      . DISCONTD: metoprolol tartrate (LOPRESSOR) tablet 12.5 mg  12.5 mg Oral Once Alleen Borne, MD      . DISCONTD: metoprolol tartrate (LOPRESSOR) tablet 12.5 mg  12.5 mg Oral BID Adella Hare, PA      . DISCONTD: microfibrllar collagen (AVITENE) pad    PRN Alleen Borne, MD   1 application at 08/03/11 1017  . DISCONTD: nitroGLYCERIN 0.2 mg/mL in dextrose 5 % infusion  0-100 mcg/min Intravenous Continuous Adella Hare, PA   5 mcg/min at 08/03/11 1710  . DISCONTD: potassium chloride injection 80 mEq  80 mEq Other To OR Alleen Borne, MD      . DISCONTD: Surgifoam 1 Gm with Thrombin 20,000 units (20 ml) topical solution    PRN Alleen Borne, MD      . DISCONTD: thrombin spray    PRN Alleen Borne, MD       Facility-Administered Medications Ordered in Other Encounters  Medication Dose Route Frequency Provider Last Rate Last Dose  . DISCONTD: albumin human 5 % solution    Continuous PRN Rosita Fire, CRNA      . DISCONTD: cefUROXime (ZINACEF) 0.75 g in dextrose 5 % 50 mL IVPB  0.75 g  Continuous PRN Rosita Fire, CRNA   0.75 g at 08/03/11 1146  . DISCONTD: ePHEDrine injection    PRN Rosita Fire, CRNA   10 mg at 08/03/11 0858  . DISCONTD: fentaNYL (SUBLIMAZE) injection    PRN Rosita Fire, CRNA   250 mcg at 08/03/11 1138  . DISCONTD: heparin injection    PRN Rosita Fire, CRNA   33,000 Units at 08/03/11 0940  . DISCONTD: lactated ringers infusion    Continuous PRN Rosita Fire, CRNA      . DISCONTD: midazolam (VERSED) 5 MG/5ML injection    PRN Rosita Fire, CRNA   2 mg at 08/03/11 1045  . DISCONTD: propofol (DIPRIVAN) 10 MG/ML infusion    PRN Rosita Fire, CRNA   100 mg at 08/03/11 0744  . DISCONTD: protamine injection    PRN Rosita Fire, CRNA   300 mg at 08/03/11 1135  . DISCONTD: rocuronium (ZEMURON) injection    PRN Rosita Fire, CRNA   30 mg at 08/03/11 1150    PE: General appearance: alert, cooperative and no  distress NO JVD Lungs: clear to auscultation bilaterally Heart: regular rate and rhythm, 1/6 apical MM;   2 component friction rub Abdomen: +BS Extremities: No LEE Pulses: 2+ and symmetric radials. 2+ right DP.  1+ left DP.  Lab Results:   Basename 08/04/11 0320 08/03/11 2022 08/03/11 2000 08/03/11 1320  WBC 12.4* -- 14.6* 9.9  HGB 10.4* 9.5* 10.8* --  HCT 29.0* 28.0* 30.2* --  PLT 179 -- 180 132*   BMET  Basename 08/04/11 0320 08/03/11 2022 08/03/11 2000 08/03/11 1320  NA 139 141 -- 141  K 4.1 4.0 -- 3.7  CL 106 108 -- --  CO2 23 -- -- --  GLUCOSE 118* 118* -- 133*  BUN 15 14 -- --  CREATININE 0.57 0.60 0.58 --  CALCIUM 7.9* -- -- --   PT/INR  Basename 08/03/11 1320  LABPROT 16.9*  INR 1.35   Studies/Results: PORTABLE CHEST - 1 VIEW  Comparison: Chest radiograph 08/03/2011  Findings: Interval removal of endotracheal tube. Swan-Ganz  catheter, mediastinal drain, the left chest tubes remain. No  pneumothorax. Mild left basilar atelectasis. Cardiomegaly.  IMPRESSION:  1. Interval extubation without complication.  2. Mild basilar atelectasis and cardiomegaly.  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  POD #1.  CABG x 3 (LIMA-LAD, SVG-PD, SVG-PL).  Hgb stable.  Weaning Neo.       LOS: 1 day    HAGER,BRYAN W 08/04/2011 9:01 AM    Patient seen and examined. Agree with assessment and plan.  Stable hemodynamics.  2 component friction rub. Pericardial changes on ECG with J pt elevation and downsloping PR segment.   Lennette Bihari, MD, [facility] 08/04/2011 9:26 AM

## 2011-08-04 NOTE — Progress Notes (Signed)
Glycemic Control Recommendations   -  Patient with elevated HgbA1C: 8.7%.  Patient's PCP is within [facility] and has Medicare.  Made referal to [employer]

## 2011-08-04 NOTE — Progress Notes (Signed)
1 Day Post-Op Procedure(s) (LRB): CORONARY ARTERY BYPASS GRAFTING (CABG) (N/A) Subjective: No complaints  Objective: Vital signs in last 24 hours: Temp:  [95.2 F (35.1 C)-99.7 F (37.6 C)] 99 F (37.2 C) (02/05 0800) Pulse Rate:  [75-91] 78  (02/05 0800) Cardiac Rhythm:  [-] Normal sinus rhythm (02/05 0800) Resp:  [0-23] 20  (02/05 0800) BP: (95-143)/(45-72) 95/48 mmHg (02/05 0600) SpO2:  [92 %-100 %] 99 % (02/05 0800) Arterial Line BP: (90-134)/(39-67) 95/39 mmHg (02/05 0800) FiO2 (%):  [39.8 %-50.5 %] 41.3 % (02/04 1645) Weight:  [107.5 kg (236 lb 15.9 oz)-107.7 kg (237 lb 7 oz)] 107.5 kg (236 lb 15.9 oz) (02/05 0500)  Hemodynamic parameters for last 24 hours: PAP: (13-32)/(2-15) 22/6 mmHg CO:  [3.8 L/min-6 L/min] 6 L/min CI:  [1.6 L/min/m2-2.6 L/min/m2] 2.6 L/min/m2  Intake/Output from previous day: 02/04 0701 - 02/05 0700 In: 5175.6 [P.O.:60; I.V.:3609.6; Blood:540; NG/GT:30; IV Piggyback:936] Out: 4140 [Urine:1890; Emesis/NG output:35; Blood:1505; Chest Tube:710] Intake/Output this shift:    General appearance: alert and cooperative Neurologic: intact Heart: regular rate and rhythm, S1, S2 normal, no murmur, click, rub or gallop Lungs: clear to auscultation bilaterally Extremities: extremities normal, atraumatic, no cyanosis or edema Wound: dressing dry  Lab Results:  Basename 08/04/11 0320 08/03/11 2022 08/03/11 2000  WBC 12.4* -- 14.6*  HGB 10.4* 9.5* --  HCT 29.0* 28.0* --  PLT 179 -- 180   BMET:  Basename 08/04/11 0320 08/03/11 2022  NA 139 141  K 4.1 4.0  CL 106 108  CO2 23 --  GLUCOSE 118* 118*  BUN 15 14  CREATININE 0.57 0.60  CALCIUM 7.9* --    PT/INR:  Basename 08/03/11 1320  LABPROT 16.9*  INR 1.35   ABG    Component Value Date/Time   PHART 7.326* 08/03/2011 1755   HCO3 22.4 08/03/2011 1755   TCO2 23 08/03/2011 2022   ACIDBASEDEF 4.0* 08/03/2011 1755   O2SAT 98.0 08/03/2011 1755   CBG (last 3)   Basename 08/04/11 0417 08/04/11 0326  08/04/11 0236  GLUCAP 117* 113* 127*   CXR:  Clear  ECG:  NSR 80, RBBB, 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

## 2011-08-04 NOTE — Progress Notes (Signed)
Services discussed with pt at bedside.  He has appreciatively agreed to participate.  We will assign an RN CM with focus on disease management education. Thank you to Garey Ham RN Diabetes Coordinator for this referral.

## 2011-08-04 NOTE — Op Note (Signed)
[name] [name], [name] [name] NO.:  [id number]  MEDICAL RECORD NO.:  [id number]  LOCATION:  2316                         FACILITY:  [facility]  PHYSICIAN:  Evelene Croon, M.D.     DATE OF BIRTH:  Aug 24, 1945  DATE OF PROCEDURE:  08/03/2011 DATE OF DISCHARGE:                              OPERATIVE REPORT   PREOPERATIVE DIAGNOSIS:  Severe multivessel coronary artery disease with moderate left ventricular dysfunction.  POSTOPERATIVE DIAGNOSIS:  Severe multivessel coronary artery disease with moderate left ventricular dysfunction.  OPERATIVE PROCEDURE:  Median sternotomy, extracorporeal circulation, coronary artery bypass graft surgery x3 using a left internal mammary artery graft to the distal left anterior descending coronary artery, with a saphenous vein graft to the posterior descending branch of the right coronary artery, and a saphenous vein graft to the posterolateral branch of the right coronary artery.  Endoscopic vein harvesting from the right leg.  ATTENDING SURGEON:  Evelene Croon, MD  ASSISTANT:  Coral Ceo, PA-C  ANESTHESIA:  General endotracheal.  CLINICAL HISTORY:  This patient is a 66 year old gentleman referred by Dr. Susa Griffins with history of diabetes and coronary artery disease with known cardiomyopathy and ejection fraction 25%, who has been treated medically.  He has had congestive heart failure symptoms and his medications were titrated upwards, but he developed generalized weakness and fatigue, and significant orthostatic hypotension requiring decrease in his medications.  With decreased medications, he has improved but continues to have significant exercise intolerance with marked exertional fatigue.  Cardiac catheterization was done in [facility] 2012 to see if he had an ischemic complement to his cardiomyopathy and this showed significant multivessel coronary artery disease with ejection fraction of 30-35%.  The LAD had diffuse 50% stenosis  after the first diagonal branch.  This was followed by about 80% focal mid vessel stenosis.  The LAD was diffusely diseased distally but without significant stenosis and continued down around the apex.  The left circumflex had a smooth ostial narrowing of about 20-30% that was not significant.  The right coronary artery was a large dominant vessel that had 70% proximal stenosis and a 99% stenosis at the ostium of a small marginal branch.  There was about 50-60% mid vessel stenosis before the posterior descending branch and about 70% stenosis in the proximal portion of the posterolateral branch.  Pulmonary artery pressure was 48/22 with a wedge pressure of 16.  Ejection fraction of about 30-35% with mild left ventricular dilatation and global hypocontractility. After review of the catheterization and echocardiogram and examination of the patient, I felt that coronary artery bypass graft surgery was probably the best treatment to prevent further ischemia and infarction and improve his quality of life.  He did have a Persantine stress test done in April 2012 which showed mild perfusion defect in the apical region with ejection fraction of 35%.  I discussed the operative procedure with the patient and his daughter including alternatives, benefits, and risks including, but not limited to bleeding, blood transfusion, infection, stroke, myocardial infarction, graft failure, and death.  He understood all of this and agreed to proceed.  OPERATIVE PROCEDURE:  The patient was taken to the operating room and placed on table  in supine position.  After induction of general endotracheal anesthesia, a Foley catheter was placed in bladder using sterile technique.  Then, the chest, abdomen, and both lower extremities were prepped and draped in the usual sterile manner.  The chest was entered through a median sternotomy incision, and the pericardium was opened in midline.  Examination of the heart showed  good ventricular contractility.  The ascending aorta was of normal size and had no palpable plaques in it.  Then, the left internal mammary artery was harvested from the chest wall as a pedicle graft.  This was a medium caliber vessel with excellent blood flow through it.  At the same time, a segment of greater saphenous vein was harvested from the right leg using endoscopic vein harvest technique.  This vein was of medium size and good quality.  Then, the patient was heparinized.  When an adequate ACT was obtained, the distal ascending aorta was cannulated using a 20-French aortic cannula for arterial inflow.  Venous outflow was achieved using a two- stage venous cannula for the right atrial appendage.  An antegrade cardioplegia and vent cannula was inserted in the aortic root.  The patient was placed on cardiopulmonary bypass and distal coronaries identified.  The LAD was diffusely diseased and this extended out into the distal vessel near the apex.  There was an area in the distal portion that was soft enough to graft.  The diagonal branch was diffusely diseased and relatively small, and there did not appear to be significant stenosis in the proximal LAD to warrant grafting this diffusely diseased vessel.  The obtuse marginal was a large vessel that was also diffusely diseased.  Since there was only 20 or 30% ostial stenosis, I did not feel this should be grafted.  The right coronary artery likewise was diffusely diseased.  The posterior descending was mildly disease, but there was a significant area of plaque distally corresponding to some narrowing seen on the catheterization.  I decided to graft the posterior descending beyond this where the vessel was still suitable for grafting to supply the apex.  The posterolateral branch was a medium-size vessel that had no significant distal disease and it was suitable for grafting.  Then, the aorta was cross-clamped and 1000 mL of cold  blood antegrade cardioplegia was administered in the aortic root with quick arrest of the heart.  Systemic hypothermia to 32 degrees centigrade and topical hypothermia with iced saline was used.  A temperature probe was placed in septum and insulating pad in the pericardium.  The first distal anastomosis was performed to the posterior descending coronary artery.  The internal diameter distally was 1.6 mm.  The conduit used was a segment of greater saphenous vein.  The anastomosis was performed in an end-to-side manner using continuous 7-0 Prolene suture.  Flow was noted through the graft and was excellent.  The second distal anastomosis was performed to the posterolateral branch.  The internal diameter of this vessel was about 1.6 mm.  The conduit used was a second segment of greater saphenous vein and anastomosis was performed in an end-to-side manner using continuous 7-0 Prolene suture.  Flow was noted through the graft and was excellent. Then, another dose of cardioplegia was given down the vein grafts and aortic root.  The third distal anastomosis was performed to the distal LAD.  The internal diameter of this vessel was about 1.6 mm.  The conduit used was a left internal mammary graft and this was brought through an opening  in the left pericardium anterior to the phrenic nerve.  It was anastomosed to LAD in an end-to-side manner using continuous 8-0 Prolene suture. The pedicle was sutured to the epicardium with 6-0 Prolene sutures.  The patient rewarmed to 37 degrees centigrade.  Another dose cardioplegia was given.  With the cross-clamp in place, the two proximal vein graft anastomoses were performed to the mid ascending aorta in an end-to-side manner using continuous 6-0 Prolene suture.  Then, the clamp was removed from mammary pedicle.  There was rapid warming of the ventricular septum and return of spontaneous ventricular fibrillation.  Cross-clamp was removed with time of 64  minutes and the patient was defibrillated into sinus rhythm.  The proximal and distal anastomoses appeared hemostatic and allowed the grafts satisfactory.  Graft markers were placed around the proximal anastomoses.  Two temporary right ventricular and right atrial pacing wires placed and brought out through the skin.  When the patient was rewarmed to 37 degrees centigrade, he was weaned from cardiopulmonary bypass on no inotropic agents.  Total bypass time was 83 minutes.  Cardiac function appeared excellent with cardiac output of 6 L/minute.  Protamine was given, and the venous and aortic cannulas were removed without difficulty.  Hemostasis was achieved.  Three chest tubes were placed with tube in the posterior pericardium, one in left pleural space, one in anterior mediastinum.  The sternum was then closed with double #6 stainless steel wires.  Fascia was closed with continuous #1 Vicryl suture.  Subcutaneous tissue was closed with continuous 2-0 Vicryl and the skin with 3-0 Vicryl subcuticular closure.  The lower extremity vein harvest site was closed in layers in similar manner.  The sponge, needle, and instrument counts were correct according to the scrub nurse.  Dry sterile dressings were applied over the incisions around chest tubes, which were hooked to Pleur-Evac suction.  The patient remained hemodynamically stable, was transported to the SICU in guarded, but stable condition.     Evelene Croon, M.D.     BB/MEDQ  D:  08/03/2011  T:  08/04/2011  Job:  [id number]  cc:   Gerlene Burdock A. Alanda Amass, M.D.

## 2011-08-05 ENCOUNTER — Inpatient Hospital Stay (HOSPITAL_COMMUNITY): Payer: Medicare Other

## 2011-08-05 LAB — BASIC METABOLIC PANEL
BUN: 16 mg/dL (ref 6–23)
Creatinine, Ser: 0.64 mg/dL (ref 0.50–1.35)
GFR calc Af Amer: >90 mL/min (ref >90)
GFR calc non Af Amer: >90 mL/min (ref >90)
Glucose, Bld: 137 mg/dL — ABNORMAL HIGH (ref 70–99)
Potassium: 3.7 mEq/L (ref 3.5–5.1)

## 2011-08-05 LAB — CBC
Hemoglobin: 8.6 g/dL — ABNORMAL LOW (ref 13.0–17.0)
MCH: 30.2 pg (ref 26.0–34.0)
MCHC: 35.2 g/dL (ref 30.0–36.0)
RDW: 14.1 % (ref 11.5–15.5)

## 2011-08-05 LAB — GLUCOSE, CAPILLARY
Glucose-Capillary: 131 mg/dL — ABNORMAL HIGH (ref 70–99)
Glucose-Capillary: 148 mg/dL — ABNORMAL HIGH (ref 70–99)
Glucose-Capillary: 144 mg/dL — ABNORMAL HIGH (ref 70–99)
Glucose-Capillary: 155 mg/dL — ABNORMAL HIGH (ref 70–99)

## 2011-08-05 MED ORDER — ALBUMIN HUMAN 5 % IV SOLN
INTRAVENOUS | Status: AC
  Administered 2011-08-05: 12.5 g
  Filled 2011-08-05: qty 250

## 2011-08-05 MED ORDER — ASPIRIN EC 325 MG PO TBEC
325.0000 mg | DELAYED_RELEASE_TABLET | Freq: Every day | ORAL | Status: DC
  Administered 2011-08-05 – 2011-08-07 (×3): 325 mg via ORAL
  Filled 2011-08-05 (×3): qty 1

## 2011-08-05 MED ORDER — BISACODYL 10 MG RE SUPP: 10.0000 mg | Freq: Every day | RECTAL | Status: DC | PRN

## 2011-08-05 MED ORDER — MOVING RIGHT ALONG BOOK
Freq: Once | Status: AC
  Administered 2011-08-05: 14:00:00
  Filled 2011-08-05: qty 1

## 2011-08-05 MED ORDER — ACETAMINOPHEN 160 MG/5ML PO SOLN
975.0000 mg | Freq: Four times a day (QID) | ORAL | Status: DC
  Filled 2011-08-05: qty 40.6

## 2011-08-05 MED ORDER — POTASSIUM CHLORIDE 10 MEQ/50ML IV SOLN
10.0000 meq | INTRAVENOUS | Status: AC | PRN
  Administered 2011-08-05 (×3): 10 meq via INTRAVENOUS

## 2011-08-05 MED ORDER — ACETAMINOPHEN 500 MG PO TABS
1000.0000 mg | ORAL_TABLET | Freq: Four times a day (QID) | ORAL | Status: DC
  Administered 2011-08-05: 1000 mg via ORAL
  Filled 2011-08-05 (×4): qty 2

## 2011-08-05 MED ORDER — BISACODYL 5 MG PO TBEC: 10.0000 mg | DELAYED_RELEASE_TABLET | Freq: Every day | ORAL | Status: DC | PRN

## 2011-08-05 MED ORDER — ACETAMINOPHEN 325 MG PO TABS
650.0000 mg | ORAL_TABLET | Freq: Four times a day (QID) | ORAL | Status: DC | PRN
  Administered 2011-08-05: 1000 mg via ORAL
  Administered 2011-08-06: 325 mg via ORAL
  Filled 2011-08-05: qty 1

## 2011-08-05 MED ORDER — SODIUM CHLORIDE 0.9 % IJ SOLN: 3.0000 mL | INTRAMUSCULAR | Status: DC | PRN

## 2011-08-05 MED ORDER — DOCUSATE SODIUM 100 MG PO CAPS
200.0000 mg | ORAL_CAPSULE | Freq: Every day | ORAL | Status: DC
  Administered 2011-08-05 – 2011-08-07 (×3): 200 mg via ORAL
  Filled 2011-08-05 (×2): qty 2

## 2011-08-05 MED ORDER — OXYCODONE HCL 5 MG PO TABS
5.0000 mg | ORAL_TABLET | ORAL | Status: DC | PRN
  Administered 2011-08-05: 5 mg via ORAL
  Administered 2011-08-06: 10 mg via ORAL
  Administered 2011-08-06: 5 mg via ORAL
  Filled 2011-08-05 (×2): qty 2
  Filled 2011-08-05: qty 1

## 2011-08-05 MED ORDER — METOCLOPRAMIDE HCL 10 MG PO TABS
10.0000 mg | ORAL_TABLET | Freq: Three times a day (TID) | ORAL | Status: DC
  Administered 2011-08-05 – 2011-08-07 (×9): 10 mg via ORAL
  Filled 2011-08-05 (×12): qty 1

## 2011-08-05 MED ORDER — INSULIN ASPART 100 UNIT/ML ~~LOC~~ SOLN
0.0000 [IU] | Freq: Three times a day (TID) | SUBCUTANEOUS | Status: DC
  Administered 2011-08-05: 4 [IU] via SUBCUTANEOUS
  Administered 2011-08-05: 2 [IU] via SUBCUTANEOUS
  Administered 2011-08-06: 8 [IU] via SUBCUTANEOUS
  Administered 2011-08-06 (×3): 4 [IU] via SUBCUTANEOUS
  Administered 2011-08-07: 2 [IU] via SUBCUTANEOUS
  Administered 2011-08-07: 8 [IU] via SUBCUTANEOUS
  Filled 2011-08-05: qty 3

## 2011-08-05 MED ORDER — TRAMADOL HCL 50 MG PO TABS: 50.0000 mg | ORAL_TABLET | ORAL | Status: DC | PRN

## 2011-08-05 MED ORDER — PANTOPRAZOLE SODIUM 40 MG PO TBEC
40.0000 mg | DELAYED_RELEASE_TABLET | Freq: Every day | ORAL | Status: DC
  Administered 2011-08-06 – 2011-08-07 (×2): 40 mg via ORAL
  Filled 2011-08-05 (×2): qty 1

## 2011-08-05 MED ORDER — SODIUM CHLORIDE 0.9 % IV SOLN: 250.0000 mL | INTRAVENOUS | Status: DC | PRN

## 2011-08-05 MED ORDER — SODIUM CHLORIDE 0.9 % IJ SOLN
3.0000 mL | Freq: Two times a day (BID) | INTRAMUSCULAR | Status: DC
  Administered 2011-08-05 – 2011-08-06 (×4): 3 mL via INTRAVENOUS

## 2011-08-05 MED FILL — Heparin Sodium (Porcine) Inj 1000 Unit/ML: INTRAMUSCULAR | Qty: 1 | Status: AC

## 2011-08-05 MED FILL — Sodium Bicarbonate IV Soln 8.4%: INTRAVENOUS | Qty: 50 | Status: AC

## 2011-08-05 MED FILL — Sodium Chloride Irrigation Soln 0.9%: Qty: 3000 | Status: AC

## 2011-08-05 MED FILL — Mannitol IV Soln 20%: INTRAVENOUS | Qty: 500 | Status: AC

## 2011-08-05 MED FILL — Lidocaine HCl IV Inj 20 MG/ML: INTRAVENOUS | Qty: 5 | Status: AC

## 2011-08-05 MED FILL — Heparin Sodium (Porcine) Inj 1000 Unit/ML: INTRAMUSCULAR | Qty: 30 | Status: AC

## 2011-08-05 MED FILL — Electrolyte-R (PH 7.4) Solution: INTRAVENOUS | Qty: 5000 | Status: AC

## 2011-08-05 MED FILL — Sodium Chloride IV Soln 0.9%: INTRAVENOUS | Qty: 1000 | Status: AC

## 2011-08-05 NOTE — Progress Notes (Signed)
Transferred to 2037 via monitor and wheelchair, placed in bed, RN to receive in room

## 2011-08-05 NOTE — Progress Notes (Signed)
   CARE MANAGEMENT NOTE 08/05/2011  Patient:  SYLAR, VOONG   Account Number:  [id number]  Date Initiated:  08/04/2011  Documentation initiated by:  [facility]  Subjective/Objective Assessment:   Post OP CABG on 08-03-11 - lives at home alone.     Action/Plan:   PTA, PT INDEPENDENT, LIVES ALONE.  STATES [facility] AND SISTER WILL PROVIDE 24HR CARE AT DISCHARGE.   Anticipated DC Date:  08/08/2011   Anticipated DC Plan:  HOME W HOME HEALTH SERVICES      DC Planning Services  CM consult      Choice offered to / List presented to:             Status of service:  In process, will continue to follow Medicare Important Message given?   (If response is "NO", the following Medicare IM given date fields will be blank) Date Medicare IM given:   Date Additional Medicare IM given:    Discharge Disposition:    Per UR Regulation:  Reviewed for med. necessity/level of care/duration of stay  Comments:  08/05/11 Jyasia Markoff,RN,BSN 1411 WILL CONTINUE TO FOLLOW FOR HOME NEEDS AS PT PROGRESSES. Phone #[phone]

## 2011-08-05 NOTE — Progress Notes (Signed)
2 Days Post-Op Procedure(s) (LRB): CORONARY ARTERY BYPASS GRAFTING (CABG) (N/A) Subjective: Feeling better.  Ambulated and sat up in chair this am without dizziness. Stomach still a little queazy.    Objective: Vital signs in last 24 hours: Temp:  [97.7 F (36.5 C)-99 F (37.2 C)] 97.7 F (36.5 C) (02/06 0723) Pulse Rate:  [75-94] 88  (02/06 0800) Cardiac Rhythm:  [-] Normal sinus rhythm (02/06 0805) Resp:  [0-24] 16  (02/06 0800) BP: (86-114)/(46-56) 92/52 mmHg (02/06 0800) SpO2:  [93 %-99 %] 97 % (02/06 0800) Arterial Line BP: (82-143)/(33-82) 143/49 mmHg (02/06 0400) Weight:  [109 kg (240 lb 4.8 oz)] 109 kg (240 lb 4.8 oz) (02/06 0550)   Neo weaned off.  Hemodynamic parameters for last 24 hours: PAP: (22-32)/(6-16) 32/14 mmHg  Intake/Output from previous day: 02/05 0701 - 02/06 0700 In: 1946 [P.O.:300; I.V.:944; IV Piggyback:702] Out: 785 [Urine:685; Chest Tube:100] Intake/Output this shift: Total I/O In: 50 [IV Piggyback:50] Out: 25 [Urine:25]  General appearance: alert and cooperative Neurologic: intact Heart: regular rate and rhythm, S1, S2 normal, no murmur, click, rub or gallop Lungs: clear to auscultation bilaterally Extremities: extremities normal, atraumatic, no cyanosis or edema Wound: dressing dry  Lab Results:  Basename 08/05/11 0418 08/04/11 1721 08/04/11 1700  WBC 9.2 -- 12.9*  HGB 8.6* 14.6 --  HCT 24.4* 43.0 --  PLT 155 -- 166   BMET:  Basename 08/05/11 0418 08/04/11 1721 08/04/11 0320  NA 137 139 --  K 3.7 3.9 --  CL 104 106 --  CO2 25 -- 23  GLUCOSE 137* 156* --  BUN 16 16 --  CREATININE 0.64 0.70 --  CALCIUM 8.1* -- 7.9*    PT/INR:  Basename 08/03/11 1320  LABPROT 16.9*  INR 1.35   ABG    Component Value Date/Time   PHART 7.326* 08/03/2011 1755   HCO3 22.4 08/03/2011 1755   TCO2 22 08/04/2011 1721   ACIDBASEDEF 4.0* 08/03/2011 1755   O2SAT 98.0 08/03/2011 1755   CBG (last 3)   Basename 08/05/11 0420 08/04/11 2334 08/04/11 1909    GLUCAP 131* 147* 167*   CXR;  Mild left base atelectasis  Assessment/Plan: S/P Procedure(s) (LRB): CORONARY ARTERY BYPASS GRAFTING (CABG) (N/A) Hold off on B Blocker for now since bp is borderline. Mobilize Diabetes control Plan for transfer to step-down: see transfer orders   LOS: 2 days    BARTLE,BRYAN K 08/05/2011

## 2011-08-05 NOTE — Progress Notes (Signed)
CARDIAC REHAB PHASE I   PRE:  Rate/Rhythm: 88 SR  BP:  Supine:   Sitting: 118/60  Standing:    SaO2: 92 Ra  MODE:  Ambulation: 244 ft   POST:  Rate/Rhythem: 100  BP:  Supine:   Sitting: 118/64  Standing:    SaO2: 98 RA 1345-1430 Assisted X 1 and used walker to ambulate. Gait steady with walker. VS stable. Pt to recliner after walk with call light in reach. Encouraged him to take another before bedtime.  [name]

## 2011-08-05 NOTE — Progress Notes (Addendum)
[facility]  Subjective: Feels better - stomach still a bit "queazy";  Ambulated today & sat up on chair without difficulty No CP or SOB  Objective: Vital signs in last 24 hours: Temp:  [97.7 F (36.5 C)-99 F (37.2 C)] 97.7 F (36.5 C) (02/06 0723) Pulse Rate:  [75-94] 88  (02/06 0900) Resp:  [0-24] 18  (02/06 0900) BP: (86-114)/(46-79) 105/79 mmHg (02/06 0900) SpO2:  [93 %-99 %] 96 % (02/06 0900) Arterial Line BP: (82-143)/(33-82) 143/49 mmHg (02/06 0400) Weight:  [109 kg (240 lb 4.8 oz)] 109 kg (240 lb 4.8 oz) (02/06 0550)    Intake/Output from previous day: 02/05 0701 - 02/06 0700 In: 1946 [P.O.:300; I.V.:944; IV Piggyback:702] Out: 785 [Urine:685; Chest Tube:100] Intake/Output this shift: Total I/O In: 230 [P.O.:180; IV Piggyback:50] Out: 55 [Urine:55]  Medications Current Facility-Administered Medications  Medication Dose Route Frequency Provider Last Rate Last Dose  . 0.9 %  sodium chloride infusion  250 mL Intravenous PRN Alleen Borne, MD      . acetaminophen (TYLENOL) tablet 650 mg  650 mg Oral Q6H PRN Alleen Borne, MD      . albumin human 5 % solution 12.5 g  12.5 g Intravenous Once Alleen Borne, MD   12.5 g at 08/04/11 2220  . albumin human 5 % solution 250 mL  250 mL Intravenous Q15 min PRN Adella Hare, PA   250 mL at 08/03/11 1358  . albumin human 5 % solution        12.5 g at 08/05/11 0009  . aspirin EC tablet 325 mg  325 mg Oral Daily Alleen Borne, MD   325 mg at 08/05/11 0939  . bisacodyl (DULCOLAX) EC tablet 10 mg  10 mg Oral Daily PRN Alleen Borne, MD       Or  . bisacodyl (DULCOLAX) suppository 10 mg  10 mg Rectal Daily PRN Alleen Borne, MD      . docusate sodium (COLACE) capsule 200 mg  200 mg Oral Daily Alleen Borne, MD   200 mg at 08/05/11 0936  . enoxaparin (LOVENOX) injection 40 mg  40 mg Subcutaneous QHS Alleen Borne, MD   40 mg at 08/04/11 2128  . famotidine (PEPCID) IVPB 20 mg  20 mg Intravenous  Q12H Adella Hare, PA   20 mg at 08/03/11 1355  . insulin aspart (novoLOG) injection 0-24 Units  0-24 Units Subcutaneous TID AC & HS Alleen Borne, MD      . insulin glargine (LANTUS) injection 40 Units  40 Units Subcutaneous Daily Alleen Borne, MD   40 Units at 08/05/11 [phone]  . insulin regular (NOVOLIN R,HUMULIN R) 1 Units/mL in sodium chloride 0.9 % 100 mL infusion   Intravenous Continuous Christella Hartigan, PHARMD 2.3 mL/hr at 08/04/11 1100    . insulin regular bolus via infusion 0-10 Units  0-10 Units Intravenous TID WC Meera K Patel, PHARMD      . metoCLOPramide (REGLAN) tablet 10 mg  10 mg Oral TID AC & HS Alleen Borne, MD      . moving right along book   Does not apply Once Alleen Borne, MD      . ondansetron [facility]) injection 4 mg  4 mg Intravenous Q6H PRN Adella Hare, PA   4 mg at 08/04/11 1444  . oxyCODONE (Oxy IR/ROXICODONE) immediate release tablet 5-10 mg  5-10 mg Oral Q3H PRN Alleen Borne,  MD      . pantoprazole (PROTONIX) EC tablet 40 mg  40 mg Oral QAC breakfast Alleen Borne, MD      . potassium chloride 10 mEq in 50 mL *CENTRAL LINE* IVPB  10 mEq Intravenous PRN Alleen Borne, MD   10 mEq at 08/05/11 0805  . rosuvastatin (CRESTOR) tablet 40 mg  40 mg Oral QHS Adella Hare, PA   40 mg at 08/04/11 2132  . sodium chloride 0.9 % injection 3 mL  3 mL Intravenous Q12H Alleen Borne, MD   3 mL at 08/05/11 0939  . sodium chloride 0.9 % injection 3 mL  3 mL Intravenous PRN Alleen Borne, MD      . traMADol Janean Sark) tablet 50-100 mg  50-100 mg Oral Q4H PRN Alleen Borne, MD       TELE - Brief run of Afib - converted spontaneously  PE: General appearance: alert, cooperative and no distress NO JVD Lungs: clear to auscultation bilaterally Heart: regular rate and rhythm, 1/6 apical MM;   2 component friction rub - less noticeable Abdomen: +BS Extremities: No LEE Pulses: 2+ and symmetric radials. 2+ right DP.  1+ left DP.  Lab Results:   Basename 08/05/11 0418  08/04/11 1721 08/04/11 1700 08/04/11 0320  WBC 9.2 -- 12.9* 12.4*  HGB 8.6* 14.6 9.7* --  HCT 24.4* 43.0 27.5* --  PLT 155 -- 166 179   BMET  Basename 08/05/11 0418 08/04/11 1721 08/04/11 1700 08/04/11 0320  NA 137 139 -- 139  K 3.7 3.9 -- 4.1  CL 104 106 -- 106  CO2 25 -- -- 23  GLUCOSE 137* 156* -- 118*  BUN 16 16 -- 15  CREATININE 0.64 0.70 0.69 --  CALCIUM 8.1* -- -- 7.9*   PT/INR  Basename 08/03/11 1320  LABPROT 16.9*  INR 1.35   Studies/Results: CXR - small L sided effusion with atelectasis  EKG: RBBB, LAFB, rate 80  Assessment/Plan Principal Problem:  *S/P CABG x 3: (LIMA-LAD, SVG-PD, SVG-PL) Ischemic CM - EF 25% on initial eval then 35-40% on 12/2010 Echo & 30-35% by LVGram.  Once BP stablizes, will need to titrate up BB & ACE-I/ARB HLD - on statin HTN - stable, not yet on BB as BP borderline (would consider are 1st choice given brief run of Afib last PM) - was on Carvedilol pre-op Bifascicular Block (RBBB & LAFB) H/o OSA DM-2 - SSI, Lantus  Post-op anemia  Nausea - reglan, PPI Mild "pericardial changes on ECG" & post-op rub; but no pericarditis pain.  Brief run of Afib over-night, resolved spontaneously  Active Problems:  POD #2.  CABG x 3 (LIMA-LAD, SVG-PD, SVG-PL).  Hgb stable. Off pressors Ready for transfer to telemtry    LOS: 2 days    Lalani Winkles W 08/05/2011  9:55 AM

## 2011-08-05 NOTE — Progress Notes (Signed)
1147 attempted to call report to 2000

## 2011-08-05 NOTE — Progress Notes (Signed)
1202 report to 2000 RN

## 2011-08-06 LAB — BASIC METABOLIC PANEL
BUN: 16 mg/dL (ref 6–23)
CO2: 26 mEq/L (ref 19–32)
Chloride: 101 mEq/L (ref 96–112)
Creatinine, Ser: 0.67 mg/dL (ref 0.50–1.35)

## 2011-08-06 LAB — GLUCOSE, CAPILLARY: Glucose-Capillary: 187 mg/dL — ABNORMAL HIGH (ref 70–99)

## 2011-08-06 LAB — CBC
HCT: 26.6 % — ABNORMAL LOW (ref 39.0–52.0)
MCH: 30.1 pg (ref 26.0–34.0)
MCV: 86.9 fL (ref 78.0–100.0)
RBC: 3.06 MIL/uL — ABNORMAL LOW (ref 4.22–5.81)
WBC: 9.5 10*3/uL (ref 4.0–10.5)

## 2011-08-06 MED ORDER — CARVEDILOL 6.25 MG PO TABS: 3.1250 mg | ORAL_TABLET | Freq: Two times a day (BID) | ORAL | Status: DC

## 2011-08-06 MED ORDER — INSULIN GLARGINE 100 UNIT/ML ~~LOC~~ SOLN
20.0000 [IU] | Freq: Every day | SUBCUTANEOUS | Status: DC
  Administered 2011-08-06 – 2011-08-07 (×2): 20 [IU] via SUBCUTANEOUS

## 2011-08-06 MED ORDER — OXYCODONE HCL 5 MG PO TABS: 5.0000 mg | ORAL_TABLET | ORAL | Status: AC | PRN

## 2011-08-06 MED ORDER — CARVEDILOL 3.125 MG PO TABS
3.1250 mg | ORAL_TABLET | Freq: Two times a day (BID) | ORAL | Status: DC
  Administered 2011-08-06 – 2011-08-07 (×3): 3.125 mg via ORAL
  Filled 2011-08-06 (×5): qty 1

## 2011-08-06 MED ORDER — GLIPIZIDE ER 10 MG PO TB24
10.0000 mg | ORAL_TABLET | Freq: Every day | ORAL | Status: DC
  Administered 2011-08-07: 10 mg via ORAL
  Filled 2011-08-06 (×2): qty 1

## 2011-08-06 MED ORDER — ASPIRIN 325 MG PO TBEC: 325.0000 mg | DELAYED_RELEASE_TABLET | Freq: Every day | ORAL | Status: AC

## 2011-08-06 MED ORDER — METOPROLOL TARTRATE 12.5 MG HALF TABLET
12.5000 mg | ORAL_TABLET | Freq: Two times a day (BID) | ORAL | Status: DC
  Filled 2011-08-06 (×2): qty 1

## 2011-08-06 MED ORDER — METFORMIN HCL 500 MG PO TABS
500.0000 mg | ORAL_TABLET | Freq: Two times a day (BID) | ORAL | Status: DC
  Administered 2011-08-06 – 2011-08-07 (×3): 500 mg via ORAL
  Filled 2011-08-06 (×5): qty 1

## 2011-08-06 MED FILL — Lactated Ringer's Solution: INTRAVENOUS | Qty: 500 | Status: AC

## 2011-08-06 MED FILL — Verapamil HCl IV Soln 2.5 MG/ML: INTRAVENOUS | Qty: 4 | Status: AC

## 2011-08-06 MED FILL — Heparin Sodium (Porcine) Inj 1000 Unit/ML: INTRAMUSCULAR | Qty: 10 | Status: AC

## 2011-08-06 MED FILL — Magnesium Sulfate Inj 50%: INTRAMUSCULAR | Qty: 10 | Status: AC

## 2011-08-06 MED FILL — Nitroglycerin IV Soln 5 MG/ML: INTRAVENOUS | Qty: 10 | Status: AC

## 2011-08-06 MED FILL — Potassium Chloride Inj 2 mEq/ML: INTRAVENOUS | Qty: 40 | Status: AC

## 2011-08-06 NOTE — Progress Notes (Signed)
CARDIAC REHAB PHASE I   PRE:  Rate/Rhythm: 95SR  BP:  Supine: 114/60  Sitting:   Standing:    SaO2: 97%RA  MODE:  Ambulation: 550 ft   POST:  Rate/Rhythem: 110  BP:  Supine:   Sitting: 146/70  Standing:    SaO2: 98%RA 0900-0920  Pt walked 550 ft with rolling walker with minimal asst.  Tolerated well. Has rolling walker at home. To recliner after walk. Call bell in reach.  Duanne Limerick

## 2011-08-06 NOTE — Discharge Summary (Signed)
[address]            [place] [address]          [phone]         Discharge Summary  Name: [name] [name] DOB: 04-Mar-1946 66 y.o. MRN: [id number]  Admission Date: 08/03/2011 Discharge Date:    Admitting Diagnosis: Coronary artery disease  Discharge Diagnosis:  Coronary artery disease Hypertension Hyperlipidemia Type 2 diabetes mellitus Ischemic cardiomyopathy with EF 25-35% History of stroke History of right bundle branch block Sleep apnea Prior history of tobacco use  Procedures: Procedure(s): CORONARY ARTERY BYPASS GRAFTING (CABG) x 3 (Left internal mammary artery to the LAD, saphenous vein graft to the posterior descending, saphenous vein graft to the posterolateral) endoscopic vein harvest right leg on 08/03/2011    HPI:  The patient is a 66 y.o. male with diabetes and coronary artery disease with known cardiomyopathy and EF 25% who has been treated medically. He presented with congestive heart failure symptoms and medications were titrated upward with initial response. In December he went to [employer] and felt weak and fatigued and developed significant orthostatic hypotension when he arrived home. He was felt to be dehydrated and medications were decreased. He had a syncopal episode when he went for lab draw and went to [facility], where he ruled out for MI. Since then he has improved but continues to have significant exercise intolerance. Most recent cath was performed 01/2011 to see if he had an ischemic component to his cardiomyopathy. This showed significant multivessel disease with EF of 30-35%. His persantine stress test in 09/2010 showed mild perfusion defect in the Apical region with no inducible ischemia and EF of 35%. 2D echo 12/2010 showed EF 35-40% with mild to moderate MR. He was referred to Dr. Evelene Croon for cardiac surgery evaluation. Dr. Laneta Simmers felt that the patient would benefit from coronary artery bypass  grafting to prevent further ischemia and had previous left ventricular function. All risks, benefits and alternatives of surgery were explained in detail, and the patient agreed to proceed.    Hospital Course:  The patient was admitted to [facility] on 08/03/2011. The patient was taken to the operating room and underwent the above procedure.    The postoperative course has generally been uneventful. The patient initially ran low systolic blood pressures and was unable to tolerate a beta blocker. However it his blood pressures have been slowly improving, now running 100 120 systolic and he has been restarted on a low dose of Coreg. He has been mildly volume overloaded and was started on Lasix. He currently has been diuresed back down to his preoperative weight and has no significant lower extremity edema on physical exam. He has been restarted in his home diabetes medications. His hemoglobin A1c on admission was 8.5 and he will require further outpatient followup with his primary care physician. He is tolerating a regular diet and ambulating in the halls without difficulty. He has remained afebrile and his vital signs and stable. It is anticipated he will be ready for discharge home within the next 24-48 hours.   Recent vital signs:  Filed Vitals:   08/06/11 0600  BP: 121/72  Pulse: 87  Temp: 97.7 F (36.5 C)  Resp: 18    Recent laboratory studies:  CBC: Basename 08/06/11 0535 08/05/11 0418  WBC 9.5 9.2  HGB 9.2* 8.6*  HCT 26.6* 24.4*  PLT 214 155   BMET:  Basename 08/06/11 0535 08/05/11 0418  NA 135 137  K 4.3 3.7  CL 101 104  CO2 26 25  GLUCOSE 200* 137*  BUN 16 16  CREATININE 0.67 0.64  CALCIUM 8.5 8.1*    PT/INR:  Basename 08/03/11 1320  LABPROT 16.9*  INR 1.35    Discharge Medications:   Medication List  As of 08/06/2011  9:50 AM   STOP taking these medications         furosemide 20 MG tablet      ibuprofen 200 MG tablet      isosorbide mononitrate 60 MG 24 hr  tablet      ramipril 2.5 MG capsule      spironolactone 25 MG tablet         TAKE these medications         aspirin 325 MG EC tablet   Take 1 tablet (325 mg total) by mouth daily.      carvedilol 6.25 MG tablet   Commonly known as: COREG   Take 0.5 tablets (3.125 mg total) by mouth 2 (two) times daily with a meal. Take 1 tablets in the morning and one in the evening.      doxazosin 4 MG tablet   Commonly known as: CARDURA   Take 2 mg by mouth at bedtime.      glipiZIDE 10 MG 24 hr tablet   Commonly known as: GLUCOTROL XL   Take 10 mg by mouth every morning.      metFORMIN 500 MG (MOD) 24 hr tablet   Commonly known as: GLUMETZA   Take 500 mg by mouth 2 (two) times daily.      ONE-A-DAY 50 PLUS PO   Take 1 tablet by mouth daily.      oxyCODONE 5 MG immediate release tablet   Commonly known as: Oxy IR/ROXICODONE   Take 1-2 tablets (5-10 mg total) by mouth every 3 (three) hours as needed for pain.      rosuvastatin 40 MG tablet   Commonly known as: CRESTOR   Take 40 mg by mouth at bedtime.             Discharge Instructions:  The patient is to refrain from driving, heavy lifting or strenuous activity.  May shower daily and clean incisions with soap and water.  May resume regular diet.  Discharge Orders    Future Appointments: Provider: Department: Dept Phone: Center:   08/31/2011 1:30 PM [facility] [phone] [id number]      Follow-up Information    Follow up with Governor Rooks, MD. Schedule an appointment as soon as possible for a visit in 2 weeks.   Contact information:   [address]  [place] [address] [phone]       Follow up with Alleen Borne, MD on 08/31/2011. (Have a chest x-ray at 12:30, then see Dr. Sharee Pimple PA at 1:30)    Contact information:   [address] [place] [address] [phone]       Schedule an appointment as soon as possible for a visit  with [name], MD. (To follow up on diabetes medications)    Contact information:   [address] [place] [address] [phone]           Areyana Leoni H 08/06/2011, 9:30 AM

## 2011-08-06 NOTE — Progress Notes (Addendum)
                    [address]            [place] [address]          [phone]     3 Days Post-Op Procedure(s) (LRB): CORONARY ARTERY BYPASS GRAFTING (CABG) (N/A)  Subjective: Feels well, no complaints.  Nausea improved and eating better.  No BM yet, but passing flatus.  Objective: Vital signs in last 24 hours: Patient Vitals for the past 24 hrs:  BP Temp Temp src Pulse Resp SpO2 Weight  08/06/11 0600 121/72 mmHg 97.7 F (36.5 C) Oral 87  18  99 % 108 kg (238 lb 1.6 oz)  08/05/11 2108 117/68 mmHg 97.6 F (36.4 C) Oral 90  18  95 % -  08/05/11 1230 138/68 mmHg 97.7 F (36.5 C) Oral 97  18  98 % -  08/05/11 1150 - 98.1 F (36.7 C) Oral - - - -  08/05/11 1100 116/55 mmHg - - 88  10  95 % -  08/05/11 1000 114/54 mmHg - - 86  10  96 % -  08/05/11 0900 105/79 mmHg - - 88  18  96 % -   Current Weight  08/06/11 108 kg (238 lb 1.6 oz)  Pre-op wt= 108 kg   Intake/Output from previous day: 02/06 0701 - 02/07 0700 In: 593 [P.O.:540; I.V.:3; IV Piggyback:50] Out: 55 [Urine:55] CBGs 155-180-200   PHYSICAL EXAM:  Heart: RRR Lungs: clear Wound: clean and dry Extremities: no edema  Lab Results: CBC: Basename 08/06/11 0535 08/05/11 0418  WBC 9.5 9.2  HGB 9.2* 8.6*  HCT 26.6* 24.4*  PLT 214 155   BMET:  Basename 08/06/11 0535 08/05/11 0418  NA 135 137  K 4.3 3.7  CL 101 104  CO2 26 25  GLUCOSE 200* 137*  BUN 16 16  CREATININE 0.67 0.64  CALCIUM 8.5 8.1*    PT/INR:  Basename 08/03/11 1320  LABPROT 16.9*  INR 1.35     Assessment/Plan: S/P Procedure(s) (LRB): CORONARY ARTERY BYPASS GRAFTING (CABG) (N/A) CV- stable, SR.  Continue current meds. BPs better- will restart low-dose beta blocker. DM- sugars stable.  Will decrease Lantus and restart po meds. (A1C= 8.5). CRPI, pulm toilet. Possible d/c home in am.     LOS: 3 days    COLLINS,GINA H 08/06/2011    Chart reviewed, patient examined, agree with above. He can go home tomorrow  if no changes and he has BM.

## 2011-08-06 NOTE — Progress Notes (Signed)
[facility]  Subjective: Ambulating well with mild SOB.  Objective: Vital signs in last 24 hours: Temp:  [97.6 F (36.4 C)-98.1 F (36.7 C)] 97.7 F (36.5 C) (02/07 0600) Pulse Rate:  [86-97] 87  (02/07 0600) Resp:  [10-18] 18  (02/07 0600) BP: (105-138)/(54-79) 121/72 mmHg (02/07 0600) SpO2:  [95 %-99 %] 99 % (02/07 0600) Weight:  [108 kg (238 lb 1.6 oz)] 108 kg (238 lb 1.6 oz) (02/07 0600) Last BM Date:  (pre-op, cannot recall which day)  Intake/Output from previous day: 02/06 0701 - 02/07 0700 In: 593 [P.O.:540; I.V.:3; IV Piggyback:50] Out: 55 [Urine:55] Intake/Output this shift:    Medications Current Facility-Administered Medications  Medication Dose Route Frequency Provider Last Rate Last Dose  . 0.9 %  sodium chloride infusion  250 mL Intravenous PRN Alleen Borne, MD      . acetaminophen (TYLENOL) tablet 650 mg  650 mg Oral Q6H PRN Alleen Borne, MD   1,000 mg at 08/05/11 1144  . aspirin EC tablet 325 mg  325 mg Oral Daily Alleen Borne, MD   325 mg at 08/05/11 0939  . bisacodyl (DULCOLAX) EC tablet 10 mg  10 mg Oral Daily PRN Alleen Borne, MD       Or  . bisacodyl (DULCOLAX) suppository 10 mg  10 mg Rectal Daily PRN Alleen Borne, MD      . docusate sodium (COLACE) capsule 200 mg  200 mg Oral Daily Alleen Borne, MD   200 mg at 08/05/11 0936  . enoxaparin (LOVENOX) injection 40 mg  40 mg Subcutaneous QHS Alleen Borne, MD   40 mg at 08/05/11 2218  . insulin aspart (novoLOG) injection 0-24 Units  0-24 Units Subcutaneous TID AC & HS Alleen Borne, MD   4 Units at 08/06/11 [phone]  . insulin glargine (LANTUS) injection 40 Units  40 Units Subcutaneous Daily Alleen Borne, MD   40 Units at 08/05/11 [phone]  . metoCLOPramide (REGLAN) tablet 10 mg  10 mg Oral TID AC & HS Alleen Borne, MD   10 mg at 08/06/11 0644  . moving right along book   Does not apply Once Alleen Borne, MD      . ondansetron [facility]) injection 4 mg  4 mg  Intravenous Q6H PRN Adella Hare, PA   4 mg at 08/04/11 1444  . oxyCODONE (Oxy IR/ROXICODONE) immediate release tablet 5-10 mg  5-10 mg Oral Q3H PRN Alleen Borne, MD   5 mg at 08/05/11 2213  . pantoprazole (PROTONIX) EC tablet 40 mg  40 mg Oral QAC breakfast Alleen Borne, MD   40 mg at 08/06/11 0645  . potassium chloride 10 mEq in 50 mL *CENTRAL LINE* IVPB  10 mEq Intravenous PRN Alleen Borne, MD   10 mEq at 08/05/11 0805  . rosuvastatin (CRESTOR) tablet 40 mg  40 mg Oral QHS Adella Hare, PA   40 mg at 08/05/11 2211  . sodium chloride 0.9 % injection 3 mL  3 mL Intravenous Q12H Alleen Borne, MD   3 mL at 08/05/11 2220  . sodium chloride 0.9 % injection 3 mL  3 mL Intravenous PRN Alleen Borne, MD      . traMADol Janean Sark) tablet 50-100 mg  50-100 mg Oral Q4H PRN Alleen Borne, MD      . DISCONTD: 0.45 % sodium chloride infusion   Intravenous Continuous Adella Hare, PA  20 mL/hr at 08/05/11 0530    . DISCONTD: 0.9 %  sodium chloride infusion   Intravenous Continuous Adella Hare, PA      . DISCONTD: 0.9 %  sodium chloride infusion  250 mL Intravenous Continuous Adella Hare, PA 1 mL/hr at 08/04/11 0542 250 mL at 08/04/11 0542  . DISCONTD: acetaminophen (TYLENOL) solution 975 mg  975 mg Per Tube Q6H Alleen Borne, MD      . DISCONTD: acetaminophen (TYLENOL) tablet 1,000 mg  1,000 mg Oral Q6H Alleen Borne, MD   1,000 mg at 08/05/11 0550  . DISCONTD: aspirin chewable tablet 324 mg  324 mg Per Tube Daily Adella Hare, PA      . DISCONTD: aspirin EC tablet 325 mg  325 mg Oral Daily Adella Hare, PA   325 mg at 08/04/11 1610  . DISCONTD: bisacodyl (DULCOLAX) EC tablet 10 mg  10 mg Oral Daily Adella Hare, PA   10 mg at 08/04/11 9604  . DISCONTD: bisacodyl (DULCOLAX) suppository 10 mg  10 mg Rectal Daily Adella Hare, PA      . DISCONTD: cefUROXime (ZINACEF) 1.5 g in dextrose 5 % 50 mL IVPB  1.5 g Intravenous Q12H Adella Hare, PA   1.5 g at 08/04/11 2132  . DISCONTD:  docusate sodium (COLACE) capsule 200 mg  200 mg Oral Daily Adella Hare, PA   200 mg at 08/04/11 5409  . DISCONTD: insulin aspart (novoLOG) injection 0-24 Units  0-24 Units Subcutaneous Q4H Alleen Borne, MD   2 Units at 08/05/11 0805  . DISCONTD: lactated ringers infusion   Intravenous Continuous Adella Hare, PA 20 mL/hr at 08/04/11 0700    . DISCONTD: metoprolol (LOPRESSOR) injection 2.5-5 mg  2.5-5 mg Intravenous Q2H PRN Adella Hare, PA      . DISCONTD: midazolam (VERSED) injection 2 mg  2 mg Intravenous Q1H PRN Adella Hare, PA      . DISCONTD: morphine 4 MG/ML injection 2-5 mg  2-5 mg Intravenous Q1H PRN Adella Hare, PA   2 mg at 08/03/11 2244  . DISCONTD: oxyCODONE (Oxy IR/ROXICODONE) immediate release tablet 5-10 mg  5-10 mg Oral Q3H PRN Adella Hare, PA   10 mg at 08/05/11 0550  . DISCONTD: pantoprazole (PROTONIX) EC tablet 40 mg  40 mg Oral Q1200 Adella Hare, PA      . DISCONTD: phenylephrine (NEO-SYNEPHRINE) 20,000 mcg in dextrose 5 % 250 mL infusion  0-100 mcg/min Intravenous Continuous Adella Hare, PA   2 mcg/min at 08/04/11 2200  . DISCONTD: pneumococcal 23 valent vaccine (PNU-IMMUNE) injection 0.5 mL  0.5 mL Intramuscular Tomorrow-1000 Alleen Borne, MD      . DISCONTD: sodium chloride 0.9 % injection 3 mL  3 mL Intravenous Q12H Adella Hare, PA   3 mL at 08/04/11 2125  . DISCONTD: sodium chloride 0.9 % injection 3 mL  3 mL Intravenous PRN Adella Hare, PA        PE: General appearance: alert, cooperative and no distress Lungs: clear to auscultation bilaterally Heart: regular rate and rhythm, S1, S2 normal, no murmur, click, rub or gallop Extremities: 1+ LEE > on the right Pulses: Radials 2+ and symmetric.  Decreased pedal pulses.  Lower ext warm.  Lab Results:   Basename 08/06/11 0535 08/05/11 0418 08/04/11 1721 08/04/11 1700  WBC 9.5 9.2 -- 12.9*  HGB 9.2* 8.6* 14.6 --  HCT 26.6* 24.4* 43.0 --  PLT 214 155 -- 166   BMET  Basename 08/06/11 0535  08/05/11 0418 08/04/11 1721 08/04/11 0320  NA 135 137 139 --  K 4.3 3.7 3.9 --  CL 101 104 106 --  CO2 26 25 -- 23  GLUCOSE 200* 137* 156* --  BUN 16 16 16  --  CREATININE 0.67 0.64 0.70 --  CALCIUM 8.5 8.1* -- 7.9*   PT/INR  Basename 08/03/11 1320  LABPROT 16.9*  INR 1.35   Cholesterol No results found for this basename: CHOL in the last 72 hours Cardiac Enzymes No components found with this basename: TROPONIN:3, CKMB:3  Studies/Results: PORTABLE CHEST - 1 VIEW  Comparison: Chest 08/04/2011.  Findings: Right IJ approach Swan-Ganz catheter have been removed  with a sheath still in place. Mediastinal drain and left chest  tube have also been removed. No pneumothorax. The patient has a  small left effusion. Subsegmental basilar atelectasis is noted.  There is cardiomegaly.  IMPRESSION:  Support apparatus as described. No pneumothorax.  Small left effusion and mild basilar subsegmental atelectasis  Assessment/Plan  Principal Problem:  *S/P CABG x 3: (LIMA-LAD, SVG-PD, SVG-PL) *S/P CABG x 3: (LIMA-LAD, SVG-PD, SVG-PL)  Ischemic CM - EF 25% on initial eval then 35-40% on 12/2010 Echo & 30-35% by LVGram.  Once BP stablizes, will need to titrate up BB & ACE-I/ARB  HLD - on statin  HTN - stable, not yet on BB as BP borderline (would consider are 1st choice given brief run of Afib last PM) - was on Carvedilol pre-op  Bifascicular Block (RBBB & LAFB)  H/o OSA  DM-2 - SSI, Lantus  Post-op anemia   Plan:  POD#3.  Hgb slightly improved.  Sinus tach on tele.  BP stable (92/52 to 138/68).  Consider adding Lopressor and titrate as BP and P allow.   LOS: 3 days    HAGER,BRYAN W 08/06/2011 8:23 AM     Patient seen and examined. Agree with assessment and plan.  Walking well with rehab.  Will start low dose beta-blocker.   Lennette Bihari, MD, [facility] 08/06/2011 9:01 AM

## 2011-08-07 LAB — GLUCOSE, CAPILLARY

## 2011-08-07 NOTE — Clinical Documentation Improvement (Signed)
Anemia Blood Loss Clarification  THIS DOCUMENT IS NOT A PERMANENT PART OF THE MEDICAL RECORD  RESPOND TO THE THIS QUERY, FOLLOW THE INSTRUCTIONS BELOW:  1. If needed, update documentation for the patient's encounter via the notes activity.  2. Access this query again and click edit on the In [employer].  3. After updating, or not, click F2 to complete all highlighted (required) fields concerning your review. Select "additional documentation in the medical record" OR "no additional documentation provided".  4. Click Sign note button.  5. The deficiency will fall out of your In Basket *Please let us know if you are not able to complete this workflow by phone or e-mail (listed below).        08/07/11  Dear Consepcion Hearing Marton Redwood  In an effort to better capture your patient's severity of illness, reflect appropriate length of stay and utilization of resources, a review of the patient medical record has revealed the following indicators.    Based on your clinical judgment, please clarify and document in a progress note and/or discharge summary the clinical condition associated with the following supporting information:   Possible Clinical Conditions?   " Expected Acute Blood Loss Anemia  " Acute Blood Loss Anemia  " Precipitous drop in Hematocrit  " Other Condition________________  " Cannot Clinically Determine    Supporting Information:  Post-op anemia noted per 2/6 progress notes.  Diagnostics: H&H: 2/5: 9.7/27.5 2/6: 8.6/24.4 2/7: 9.2/26.6  IV fluids / plasma expanders:  2/4: Albumin,human, 5% solution, 12.5gm x2 2/5: Albumin,human, 5% solution, 12.5gm 2/6: Albumin,human, 5% solution, 12.5gm  Reviewed:    No additional documentation noted.  Thank You,  Marciano Sequin,  Clinical Documentation Specialist:  Pager: [phone]  Health Information Management [place]

## 2011-08-07 NOTE — Progress Notes (Signed)
Cardiac Rehab [phone] Education completed with pt. Permission given to refer to [facility] Phase 2. Encouraged better  Control of diabetes.Angella Montas DunlapRN

## 2011-08-07 NOTE — Progress Notes (Addendum)
[address]            [place] [address]          [phone]     4 Days Post-Op  Procedure(s) (LRB): CORONARY ARTERY BYPASS GRAFTING (CABG) (N/A) Subjective: Feels very well, no c/o  Objective  Telemetry NSR, rare missed beat  Temp:  [97.6 F (36.4 C)-98.8 F (37.1 C)] 98.8 F (37.1 C) (02/08 0542) Pulse Rate:  [62-81] 81  (02/08 0542) Resp:  [18] 18  (02/08 0542) BP: (113-132)/(68-75) 132/75 mmHg (02/08 0542) SpO2:  [94 %-98 %] 97 % (02/08 0542) Weight:  [240 lb 4.8 oz (109 kg)] 240 lb 4.8 oz (109 kg) (02/08 0542)   Intake/Output Summary (Last 24 hours) at 08/07/11 0739 Last data filed at 08/06/11 2313  Gross per 24 hour  Intake    480 ml  Output    525 ml  Net    -45 ml       General appearance: alert, cooperative and no distress Heart: regular rate and rhythm and S1, S2 normal Lungs: min diminished in bases Abdomen: soft, non-tender; bowel sounds normal; no masses,  no organomegaly Extremities: min edema Wound: incisions healing well  Lab Results:  Basename 08/06/11 0535 08/05/11 0418 08/04/11 1700  NA 135 137 --  K 4.3 3.7 --  CL 101 104 --  CO2 26 25 --  GLUCOSE 200* 137* --  BUN 16 16 --  CREATININE 0.67 0.64 --  CALCIUM 8.5 8.1* --  MG -- -- 2.3  PHOS -- -- --   No results found for this basename: AST:2,ALT:2,ALKPHOS:2,BILITOT:2,PROT:2,ALBUMIN:2 in the last 72 hours No results found for this basename: LIPASE:2,AMYLASE:2 in the last 72 hours  Basename 08/06/11 0535 08/05/11 0418  WBC 9.5 9.2  NEUTROABS -- --  HGB 9.2* 8.6*  HCT 26.6* 24.4*  MCV 86.9 85.6  PLT 214 155   No results found for this basename: CKTOTAL:4,CKMB:4,TROPONINI:4 in the last 72 hours No components found with this basename: POCBNP:3 No results found for this basename: DDIMER in the last 72 hours No results found for this basename: HGBA1C in the last 72 hours No results found for this basename: CHOL,HDL,LDLCALC,TRIG,CHOLHDL in the  last 72 hours No results found for this basename: TSH,T4TOTAL,FREET3,T3FREE,THYROIDAB in the last 72 hours No results found for this basename: VITAMINB12,FOLATE,FERRITIN,TIBC,IRON,RETICCTPCT in the last 72 hours  Medications: Scheduled    . aspirin EC  325 mg Oral Daily  . carvedilol  3.125 mg Oral BID WC  . docusate sodium  200 mg Oral Daily  . enoxaparin  40 mg Subcutaneous QHS  . glipiZIDE  10 mg Oral Q breakfast  . insulin aspart  0-24 Units Subcutaneous TID AC & HS  . insulin glargine  20 Units Subcutaneous Daily  . metFORMIN  500 mg Oral BID WC  . metoCLOPramide  10 mg Oral TID AC & HS  . pantoprazole  40 mg Oral QAC breakfast  . rosuvastatin  40 mg Oral QHS  . sodium chloride  3 mL Intravenous Q12H  . DISCONTD: insulin glargine  40 Units Subcutaneous Daily  . DISCONTD: metoprolol tartrate  12.5 mg Oral BID     Radiology/Studies:  No results found.  INR: Will add last result for INR, ABG once components are confirmed Will add last 4 CBG results once components are confirmed  Assessment/Plan: S/P Procedure(s) (LRB): CORONARY ARTERY BYPASS GRAFTING (  CABG) (N/A) 1. Doing well, d/c epw's stable for d/c home    LOS: 4 days    GOLD,WAYNE E 2/8/20137:39 AM     Chart reviewed, patient examined, agree with above.

## 2011-08-07 NOTE — Progress Notes (Signed)
[facility]  Subjective: Feels good.  Ambulating well.  Breathing has improved.  Objective: Vital signs in last 24 hours: Temp:  [97.6 F (36.4 C)-98.8 F (37.1 C)] 98.8 F (37.1 C) (02/08 0542) Pulse Rate:  [62-81] 81  (02/08 0542) Resp:  [18] 18  (02/08 0542) BP: (113-132)/(68-75) 132/75 mmHg (02/08 0542) SpO2:  [94 %-98 %] 97 % (02/08 0542) Weight:  [109 kg (240 lb 4.8 oz)] 109 kg (240 lb 4.8 oz) (02/08 0542) Last BM Date: 08/03/11  Intake/Output from previous day: 02/07 0701 - 02/08 0700 In: 720 [P.O.:720] Out: 875 [Urine:875] Intake/Output this shift:    Medications Current Facility-Administered Medications  Medication Dose Route Frequency Provider Last Rate Last Dose  . 0.9 %  sodium chloride infusion  250 mL Intravenous PRN Alleen Borne, MD      . acetaminophen (TYLENOL) tablet 650 mg  650 mg Oral Q6H PRN Alleen Borne, MD   325 mg at 08/06/11 2128  . aspirin EC tablet 325 mg  325 mg Oral Daily Alleen Borne, MD   325 mg at 08/06/11 1202  . bisacodyl (DULCOLAX) EC tablet 10 mg  10 mg Oral Daily PRN Alleen Borne, MD       Or  . bisacodyl (DULCOLAX) suppository 10 mg  10 mg Rectal Daily PRN Alleen Borne, MD      . carvedilol (COREG) tablet 3.125 mg  3.125 mg Oral BID WC Adella Hare, PA   3.125 mg at 08/07/11 0616  . docusate sodium (COLACE) capsule 200 mg  200 mg Oral Daily Alleen Borne, MD   200 mg at 08/06/11 1200  . enoxaparin (LOVENOX) injection 40 mg  40 mg Subcutaneous QHS Alleen Borne, MD   40 mg at 08/06/11 2116  . glipiZIDE (GLUCOTROL XL) 24 hr tablet 10 mg  10 mg Oral Q breakfast Adella Hare, PA   10 mg at 08/07/11 0616  . insulin aspart (novoLOG) injection 0-24 Units  0-24 Units Subcutaneous TID AC & HS Alleen Borne, MD   8 Units at 08/07/11 [phone]  . insulin glargine (LANTUS) injection 20 Units  20 Units Subcutaneous Daily Adella Hare, PA   20 Units at 08/06/11 1201  . metFORMIN (GLUCOPHAGE) tablet 500 mg   500 mg Oral BID WC Adella Hare, PA   500 mg at 08/07/11 0616  . metoCLOPramide (REGLAN) tablet 10 mg  10 mg Oral TID AC & HS Alleen Borne, MD   10 mg at 08/07/11 0615  . ondansetron (ZOFRAN) injection 4 mg  4 mg Intravenous Q6H PRN Adella Hare, PA   4 mg at 08/04/11 1444  . oxyCODONE (Oxy IR/ROXICODONE) immediate release tablet 5-10 mg  5-10 mg Oral Q3H PRN Alleen Borne, MD   5 mg at 08/06/11 2128  . pantoprazole (PROTONIX) EC tablet 40 mg  40 mg Oral QAC breakfast Alleen Borne, MD   40 mg at 08/07/11 1191  . rosuvastatin (CRESTOR) tablet 40 mg  40 mg Oral QHS Adella Hare, PA   40 mg at 08/06/11 2117  . sodium chloride 0.9 % injection 3 mL  3 mL Intravenous Q12H Alleen Borne, MD   3 mL at 08/06/11 2116  . sodium chloride 0.9 % injection 3 mL  3 mL Intravenous PRN Alleen Borne, MD      . traMADol Janean Sark) tablet 50-100 mg  50-100 mg Oral Q4H PRN Judie Grieve  Jennefer Bravo, MD      . DISCONTD: insulin glargine (LANTUS) injection 40 Units  40 Units Subcutaneous Daily Alleen Borne, MD   40 Units at 08/05/11 [phone]  . DISCONTD: metoprolol tartrate (LOPRESSOR) tablet 12.5 mg  12.5 mg Oral BID Lennette Bihari, MD        PE: General appearance: alert, cooperative and no distress Lungs: Decreased BS bilaterally,  No rales or wheeze. Heart: regular rate and rhythm, S1, S2 normal, no murmur, click, rub or gallop Extremities: 1+ right LEE edema. Pulses: radials 2+ and symmetric  Lab Results:   Basename 08/06/11 0535 08/05/11 0418 08/04/11 1721 08/04/11 1700  WBC 9.5 9.2 -- 12.9*  HGB 9.2* 8.6* 14.6 --  HCT 26.6* 24.4* 43.0 --  PLT 214 155 -- 166   BMET  Basename 08/06/11 0535 08/05/11 0418 08/04/11 1721  NA 135 137 139  K 4.3 3.7 3.9  CL 101 104 106  CO2 26 25 --  GLUCOSE 200* 137* 156*  BUN 16 16 16   CREATININE 0.67 0.64 0.70  CALCIUM 8.5 8.1* --     Assessment/Plan  Principal Problem:  *S/P CABG x 3: (LIMA-LAD, SVG-PD, SVG-PL) Ischemic CM - EF 25% on initial eval then 35-40%  on 12/2010 Echo & 30-35% by LVGram.  Once BP stablizes, will need to titrate up BB & ACE-I/ARB  HLD - on statin  HTN - stable, not yet on BB as BP borderline (would consider are 1st choice given brief run of Afib last PM) - was on Carvedilol pre-op  Bifascicular Block (RBBB & LAFB)  H/o OSA  DM-2 - SSI, Lantus  Post-op anemia   Plan:  Beta blocker started yesterday.  Hgb stable.  BP/HR controlled and stable.  Likely DC home today.     LOS: 4 days    HAGER,BRYAN W 08/07/2011 8:03 AM  I have seen and examined the patient along with Wilburt Finlay, PA.  I have reviewed the chart, notes and new data.  I agree with Bryan's note.  Key new complaints: Breathing better, ambulating without difficulty. Key examination changes: agree with above exam Key new findings / data: maintaining NSR, Hgb slightly dropping  PLAN:  Would titrate up BB on discharge, can address ACE-I in Cardiology f/u with Dr. Cloyde Reams if BP stable.  Will need OP Echo in ~2-3 months to reassess EF with thought to potential BiVICD.Marland Kitchen  Will need PMD f/u to ensure glycemic control adequate.  Anticipate d/c today.  Marykay Lex, M.D., M.S. [facility] [address] [place], [place]  [address]  [phone]  08/07/2011 10:11 AM

## 2011-08-07 NOTE — Progress Notes (Signed)
Pts chest tube sutures removed.  Inc well approx, steri strips applied. Pt tol well.

## 2011-08-07 NOTE — Progress Notes (Signed)
Epicardial Pacing Wire removed and intact upon removal. Pt tolerated well and instructed to rest in bed for hour. Set up with frequent vitals. Will continue to monitor. [facility]

## 2011-08-11 ENCOUNTER — Telehealth: Payer: Self-pay | Admitting: *Deleted

## 2011-08-11 NOTE — Telephone Encounter (Signed)
[name] daughter called with concerns of her father's continuing elevated blood sugars after being d/c after surgery.  He has seen his PCP since being home.  She feels he needs to see an endocrinologist for better management and would like [place] to refer him.  I explained that blood sugars are often difficult to adjust immediately post-op and she should contact his PCP with his present readings.  He would be the one to refer to an endocrinologist if he felt it necessary.  She agreed.

## 2011-08-17 DIAGNOSIS — E782 Mixed hyperlipidemia: Secondary | ICD-10-CM | POA: Diagnosis not present

## 2011-08-17 DIAGNOSIS — Z79899 Other long term (current) drug therapy: Secondary | ICD-10-CM | POA: Diagnosis not present

## 2011-08-17 DIAGNOSIS — I5022 Chronic systolic (congestive) heart failure: Secondary | ICD-10-CM | POA: Diagnosis not present

## 2011-08-17 DIAGNOSIS — I2510 Atherosclerotic heart disease of native coronary artery without angina pectoris: Secondary | ICD-10-CM | POA: Diagnosis not present

## 2011-08-17 DIAGNOSIS — I4510 Unspecified right bundle-branch block: Secondary | ICD-10-CM | POA: Diagnosis not present

## 2011-08-17 DIAGNOSIS — R0602 Shortness of breath: Secondary | ICD-10-CM | POA: Diagnosis not present

## 2011-08-26 ENCOUNTER — Other Ambulatory Visit: Payer: Self-pay | Admitting: Surgery

## 2011-08-26 DIAGNOSIS — I2510 Atherosclerotic heart disease of native coronary artery without angina pectoris: Secondary | ICD-10-CM

## 2011-08-31 ENCOUNTER — Ambulatory Visit (INDEPENDENT_AMBULATORY_CARE_PROVIDER_SITE_OTHER): Payer: Self-pay | Admitting: Surgical

## 2011-08-31 ENCOUNTER — Ambulatory Visit
Admission: RE | Admit: 2011-08-31 | Discharge: 2011-08-31 | Disposition: A | Discharge status: Home / Self Care | Payer: Medicare Other | Source: Ambulatory Visit | Attending: Surgery | Admitting: Surgery

## 2011-08-31 VITALS — BP 104/56 | HR 84 | Resp 18 | Ht 74.0 in | Wt 234.0 lb

## 2011-08-31 DIAGNOSIS — I2510 Atherosclerotic heart disease of native coronary artery without angina pectoris: Secondary | ICD-10-CM

## 2011-08-31 DIAGNOSIS — I517 Cardiomegaly: Secondary | ICD-10-CM | POA: Diagnosis not present

## 2011-08-31 DIAGNOSIS — E785 Hyperlipidemia, unspecified: Secondary | ICD-10-CM | POA: Diagnosis not present

## 2011-08-31 DIAGNOSIS — Z951 Presence of aortocoronary bypass graft: Secondary | ICD-10-CM

## 2011-08-31 NOTE — Progress Notes (Signed)
  HPI: Patient returns for routine postoperative follow-up having undergone CABGx3 on 08/03/11. The patient's early postoperative recovery while in the hospital was notable for low relative systolic BP. Since hospital discharge the patient reports he feels remarkably well. He has used no pain meds. He denies SOB and is ambulating well. His cardura has also been stopped for lower BP's. .   Current Outpatient Prescriptions  Medication Sig Dispense Refill  . carvedilol (COREG) 6.25 MG tablet Take 0.5 tablets (3.125 mg total) by mouth 2 (two) times daily with a meal. Take 1 tablets in the morning and one in the evening.      . ferrous sulfate 325 (65 FE) MG EC tablet Take 325 mg by mouth 1 day or 1 dose.      . folic acid (FOLVITE) 1 MG tablet Take 1 mg by mouth daily.      Marland Kitchen glipiZIDE (GLUCOTROL XL) 10 MG 24 hr tablet Take 10 mg by mouth every morning.       . metFORMIN (GLUMETZA) 500 MG (MOD) 24 hr tablet Take 500 mg by mouth 2 (two) times daily.       . Multiple Vitamins-Minerals (ONE-A-DAY 50 PLUS PO) Take 1 tablet by mouth daily.        . rosuvastatin (CRESTOR) 40 MG tablet Take 40 mg by mouth at bedtime.          Physical Exam: Physical Exam  Constitutional: He appears healthy.  Pulmonary/Chest: Breath sounds normal. He has no wheezes. He has no rales. He exhibits no tenderness.  Abdominal: Soft. There is no tenderness.  Musculoskeletal: He exhibits no edema.  Neurological: He is alert and oriented to person, place, and time.  Skin: Skin is warm and dry.    Diagnostic Tests: Chest xray shows clear lung fields with stable cardiomegally  Impression: Doing well.  Plan: F/u prn for any surgical issues

## 2011-08-31 NOTE — Patient Instructions (Signed)
May drive- discussed usual protocol.  Discussed return to work timing and instructions.

## 2011-09-07 DIAGNOSIS — I10 Essential (primary) hypertension: Secondary | ICD-10-CM | POA: Diagnosis not present

## 2011-09-07 DIAGNOSIS — Z951 Presence of aortocoronary bypass graft: Secondary | ICD-10-CM | POA: Diagnosis not present

## 2011-09-07 DIAGNOSIS — E119 Type 2 diabetes mellitus without complications: Secondary | ICD-10-CM | POA: Diagnosis not present

## 2011-09-07 DIAGNOSIS — I2510 Atherosclerotic heart disease of native coronary artery without angina pectoris: Secondary | ICD-10-CM | POA: Diagnosis not present

## 2011-09-07 DIAGNOSIS — Z5189 Encounter for other specified aftercare: Secondary | ICD-10-CM | POA: Diagnosis not present

## 2011-09-08 DIAGNOSIS — Z5189 Encounter for other specified aftercare: Secondary | ICD-10-CM | POA: Diagnosis not present

## 2011-09-08 DIAGNOSIS — E119 Type 2 diabetes mellitus without complications: Secondary | ICD-10-CM | POA: Diagnosis not present

## 2011-09-08 DIAGNOSIS — Z951 Presence of aortocoronary bypass graft: Secondary | ICD-10-CM | POA: Diagnosis not present

## 2011-09-08 DIAGNOSIS — I10 Essential (primary) hypertension: Secondary | ICD-10-CM | POA: Diagnosis not present

## 2011-09-08 DIAGNOSIS — I2510 Atherosclerotic heart disease of native coronary artery without angina pectoris: Secondary | ICD-10-CM | POA: Diagnosis not present

## 2011-09-10 DIAGNOSIS — I2510 Atherosclerotic heart disease of native coronary artery without angina pectoris: Secondary | ICD-10-CM | POA: Diagnosis not present

## 2011-09-10 DIAGNOSIS — E119 Type 2 diabetes mellitus without complications: Secondary | ICD-10-CM | POA: Diagnosis not present

## 2011-09-10 DIAGNOSIS — I10 Essential (primary) hypertension: Secondary | ICD-10-CM | POA: Diagnosis not present

## 2011-09-10 DIAGNOSIS — Z5189 Encounter for other specified aftercare: Secondary | ICD-10-CM | POA: Diagnosis not present

## 2011-09-10 DIAGNOSIS — Z951 Presence of aortocoronary bypass graft: Secondary | ICD-10-CM | POA: Diagnosis not present

## 2011-09-14 DIAGNOSIS — I10 Essential (primary) hypertension: Secondary | ICD-10-CM | POA: Diagnosis not present

## 2011-09-14 DIAGNOSIS — Z5189 Encounter for other specified aftercare: Secondary | ICD-10-CM | POA: Diagnosis not present

## 2011-09-14 DIAGNOSIS — I2510 Atherosclerotic heart disease of native coronary artery without angina pectoris: Secondary | ICD-10-CM | POA: Diagnosis not present

## 2011-09-14 DIAGNOSIS — Z951 Presence of aortocoronary bypass graft: Secondary | ICD-10-CM | POA: Diagnosis not present

## 2011-09-14 DIAGNOSIS — E119 Type 2 diabetes mellitus without complications: Secondary | ICD-10-CM | POA: Diagnosis not present

## 2011-09-15 DIAGNOSIS — I2510 Atherosclerotic heart disease of native coronary artery without angina pectoris: Secondary | ICD-10-CM | POA: Diagnosis not present

## 2011-09-15 DIAGNOSIS — Z951 Presence of aortocoronary bypass graft: Secondary | ICD-10-CM | POA: Diagnosis not present

## 2011-09-15 DIAGNOSIS — E119 Type 2 diabetes mellitus without complications: Secondary | ICD-10-CM | POA: Diagnosis not present

## 2011-09-15 DIAGNOSIS — I10 Essential (primary) hypertension: Secondary | ICD-10-CM | POA: Diagnosis not present

## 2011-09-15 DIAGNOSIS — Z5189 Encounter for other specified aftercare: Secondary | ICD-10-CM | POA: Diagnosis not present

## 2011-09-17 DIAGNOSIS — Z5189 Encounter for other specified aftercare: Secondary | ICD-10-CM | POA: Diagnosis not present

## 2011-09-17 DIAGNOSIS — I2510 Atherosclerotic heart disease of native coronary artery without angina pectoris: Secondary | ICD-10-CM | POA: Diagnosis not present

## 2011-09-17 DIAGNOSIS — I10 Essential (primary) hypertension: Secondary | ICD-10-CM | POA: Diagnosis not present

## 2011-09-17 DIAGNOSIS — E119 Type 2 diabetes mellitus without complications: Secondary | ICD-10-CM | POA: Diagnosis not present

## 2011-09-17 DIAGNOSIS — Z951 Presence of aortocoronary bypass graft: Secondary | ICD-10-CM | POA: Diagnosis not present

## 2011-09-21 DIAGNOSIS — I2510 Atherosclerotic heart disease of native coronary artery without angina pectoris: Secondary | ICD-10-CM | POA: Diagnosis not present

## 2011-09-21 DIAGNOSIS — Z951 Presence of aortocoronary bypass graft: Secondary | ICD-10-CM | POA: Diagnosis not present

## 2011-09-21 DIAGNOSIS — Z5189 Encounter for other specified aftercare: Secondary | ICD-10-CM | POA: Diagnosis not present

## 2011-09-21 DIAGNOSIS — E119 Type 2 diabetes mellitus without complications: Secondary | ICD-10-CM | POA: Diagnosis not present

## 2011-09-21 DIAGNOSIS — I10 Essential (primary) hypertension: Secondary | ICD-10-CM | POA: Diagnosis not present

## 2011-09-22 DIAGNOSIS — I2510 Atherosclerotic heart disease of native coronary artery without angina pectoris: Secondary | ICD-10-CM | POA: Diagnosis not present

## 2011-09-22 DIAGNOSIS — Z5189 Encounter for other specified aftercare: Secondary | ICD-10-CM | POA: Diagnosis not present

## 2011-09-22 DIAGNOSIS — I10 Essential (primary) hypertension: Secondary | ICD-10-CM | POA: Diagnosis not present

## 2011-09-22 DIAGNOSIS — E119 Type 2 diabetes mellitus without complications: Secondary | ICD-10-CM | POA: Diagnosis not present

## 2011-09-22 DIAGNOSIS — Z951 Presence of aortocoronary bypass graft: Secondary | ICD-10-CM | POA: Diagnosis not present

## 2011-09-24 DIAGNOSIS — Z951 Presence of aortocoronary bypass graft: Secondary | ICD-10-CM | POA: Diagnosis not present

## 2011-09-24 DIAGNOSIS — I2510 Atherosclerotic heart disease of native coronary artery without angina pectoris: Secondary | ICD-10-CM | POA: Diagnosis not present

## 2011-09-24 DIAGNOSIS — Z5189 Encounter for other specified aftercare: Secondary | ICD-10-CM | POA: Diagnosis not present

## 2011-09-24 DIAGNOSIS — I10 Essential (primary) hypertension: Secondary | ICD-10-CM | POA: Diagnosis not present

## 2011-09-24 DIAGNOSIS — E119 Type 2 diabetes mellitus without complications: Secondary | ICD-10-CM | POA: Diagnosis not present

## 2011-09-28 DIAGNOSIS — E119 Type 2 diabetes mellitus without complications: Secondary | ICD-10-CM | POA: Diagnosis not present

## 2011-09-28 DIAGNOSIS — I10 Essential (primary) hypertension: Secondary | ICD-10-CM | POA: Diagnosis not present

## 2011-09-28 DIAGNOSIS — I2510 Atherosclerotic heart disease of native coronary artery without angina pectoris: Secondary | ICD-10-CM | POA: Diagnosis not present

## 2011-09-28 DIAGNOSIS — Z5189 Encounter for other specified aftercare: Secondary | ICD-10-CM | POA: Diagnosis not present

## 2011-09-28 DIAGNOSIS — Z951 Presence of aortocoronary bypass graft: Secondary | ICD-10-CM | POA: Diagnosis not present

## 2011-10-05 DIAGNOSIS — I4510 Unspecified right bundle-branch block: Secondary | ICD-10-CM | POA: Diagnosis not present

## 2011-10-05 DIAGNOSIS — I5022 Chronic systolic (congestive) heart failure: Secondary | ICD-10-CM | POA: Diagnosis not present

## 2011-10-05 DIAGNOSIS — Z951 Presence of aortocoronary bypass graft: Secondary | ICD-10-CM | POA: Diagnosis not present

## 2011-10-13 DIAGNOSIS — E785 Hyperlipidemia, unspecified: Secondary | ICD-10-CM | POA: Diagnosis not present

## 2011-10-14 DIAGNOSIS — IMO0001 Reserved for inherently not codable concepts without codable children: Secondary | ICD-10-CM | POA: Diagnosis not present

## 2011-10-14 DIAGNOSIS — I10 Essential (primary) hypertension: Secondary | ICD-10-CM | POA: Diagnosis not present

## 2011-10-14 DIAGNOSIS — E7800 Pure hypercholesterolemia, unspecified: Secondary | ICD-10-CM | POA: Diagnosis not present

## 2011-10-16 DIAGNOSIS — I5022 Chronic systolic (congestive) heart failure: Secondary | ICD-10-CM | POA: Diagnosis not present

## 2011-10-16 DIAGNOSIS — E785 Hyperlipidemia, unspecified: Secondary | ICD-10-CM | POA: Diagnosis not present

## 2011-10-16 DIAGNOSIS — I2510 Atherosclerotic heart disease of native coronary artery without angina pectoris: Secondary | ICD-10-CM | POA: Diagnosis not present

## 2011-10-16 DIAGNOSIS — Z951 Presence of aortocoronary bypass graft: Secondary | ICD-10-CM | POA: Diagnosis not present

## 2011-10-16 DIAGNOSIS — E119 Type 2 diabetes mellitus without complications: Secondary | ICD-10-CM | POA: Diagnosis not present

## 2011-10-16 DIAGNOSIS — E559 Vitamin D deficiency, unspecified: Secondary | ICD-10-CM | POA: Diagnosis not present

## 2011-10-22 DIAGNOSIS — G4733 Obstructive sleep apnea (adult) (pediatric): Secondary | ICD-10-CM | POA: Diagnosis not present

## 2011-10-22 DIAGNOSIS — E119 Type 2 diabetes mellitus without complications: Secondary | ICD-10-CM | POA: Diagnosis not present

## 2011-10-22 DIAGNOSIS — I10 Essential (primary) hypertension: Secondary | ICD-10-CM | POA: Diagnosis not present

## 2011-10-22 DIAGNOSIS — I259 Chronic ischemic heart disease, unspecified: Secondary | ICD-10-CM | POA: Diagnosis not present

## 2011-10-22 DIAGNOSIS — I951 Orthostatic hypotension: Secondary | ICD-10-CM | POA: Diagnosis not present

## 2011-10-22 DIAGNOSIS — I509 Heart failure, unspecified: Secondary | ICD-10-CM | POA: Diagnosis not present

## 2011-10-22 DIAGNOSIS — E7800 Pure hypercholesterolemia, unspecified: Secondary | ICD-10-CM | POA: Diagnosis not present

## 2011-10-28 DIAGNOSIS — Z79899 Other long term (current) drug therapy: Secondary | ICD-10-CM | POA: Diagnosis not present

## 2011-10-28 DIAGNOSIS — R0602 Shortness of breath: Secondary | ICD-10-CM | POA: Diagnosis not present

## 2011-10-29 DIAGNOSIS — I10 Essential (primary) hypertension: Secondary | ICD-10-CM | POA: Diagnosis not present

## 2011-10-29 DIAGNOSIS — E119 Type 2 diabetes mellitus without complications: Secondary | ICD-10-CM | POA: Diagnosis not present

## 2011-10-29 DIAGNOSIS — Z951 Presence of aortocoronary bypass graft: Secondary | ICD-10-CM | POA: Diagnosis not present

## 2011-10-29 DIAGNOSIS — Z5189 Encounter for other specified aftercare: Secondary | ICD-10-CM | POA: Diagnosis not present

## 2011-10-29 DIAGNOSIS — I2510 Atherosclerotic heart disease of native coronary artery without angina pectoris: Secondary | ICD-10-CM | POA: Diagnosis not present

## 2011-11-30 DIAGNOSIS — E119 Type 2 diabetes mellitus without complications: Secondary | ICD-10-CM | POA: Diagnosis not present

## 2011-11-30 DIAGNOSIS — I10 Essential (primary) hypertension: Secondary | ICD-10-CM | POA: Diagnosis not present

## 2011-11-30 DIAGNOSIS — Z5189 Encounter for other specified aftercare: Secondary | ICD-10-CM | POA: Diagnosis not present

## 2011-11-30 DIAGNOSIS — I2510 Atherosclerotic heart disease of native coronary artery without angina pectoris: Secondary | ICD-10-CM | POA: Diagnosis not present

## 2011-11-30 DIAGNOSIS — Z951 Presence of aortocoronary bypass graft: Secondary | ICD-10-CM | POA: Diagnosis not present

## 2011-12-01 DIAGNOSIS — E782 Mixed hyperlipidemia: Secondary | ICD-10-CM | POA: Diagnosis not present

## 2011-12-01 DIAGNOSIS — I5022 Chronic systolic (congestive) heart failure: Secondary | ICD-10-CM | POA: Diagnosis not present

## 2011-12-01 DIAGNOSIS — I2510 Atherosclerotic heart disease of native coronary artery without angina pectoris: Secondary | ICD-10-CM | POA: Diagnosis not present

## 2011-12-01 DIAGNOSIS — E119 Type 2 diabetes mellitus without complications: Secondary | ICD-10-CM | POA: Diagnosis not present

## 2011-12-03 DIAGNOSIS — E119 Type 2 diabetes mellitus without complications: Secondary | ICD-10-CM | POA: Diagnosis not present

## 2011-12-03 DIAGNOSIS — I10 Essential (primary) hypertension: Secondary | ICD-10-CM | POA: Diagnosis not present

## 2011-12-03 DIAGNOSIS — Z5189 Encounter for other specified aftercare: Secondary | ICD-10-CM | POA: Diagnosis not present

## 2011-12-03 DIAGNOSIS — I2510 Atherosclerotic heart disease of native coronary artery without angina pectoris: Secondary | ICD-10-CM | POA: Diagnosis not present

## 2011-12-03 DIAGNOSIS — Z951 Presence of aortocoronary bypass graft: Secondary | ICD-10-CM | POA: Diagnosis not present

## 2011-12-07 DIAGNOSIS — E119 Type 2 diabetes mellitus without complications: Secondary | ICD-10-CM | POA: Diagnosis not present

## 2011-12-07 DIAGNOSIS — I10 Essential (primary) hypertension: Secondary | ICD-10-CM | POA: Diagnosis not present

## 2011-12-07 DIAGNOSIS — Z951 Presence of aortocoronary bypass graft: Secondary | ICD-10-CM | POA: Diagnosis not present

## 2011-12-07 DIAGNOSIS — Z5189 Encounter for other specified aftercare: Secondary | ICD-10-CM | POA: Diagnosis not present

## 2011-12-07 DIAGNOSIS — I2510 Atherosclerotic heart disease of native coronary artery without angina pectoris: Secondary | ICD-10-CM | POA: Diagnosis not present

## 2011-12-08 DIAGNOSIS — E119 Type 2 diabetes mellitus without complications: Secondary | ICD-10-CM | POA: Diagnosis not present

## 2011-12-08 DIAGNOSIS — I10 Essential (primary) hypertension: Secondary | ICD-10-CM | POA: Diagnosis not present

## 2011-12-08 DIAGNOSIS — Z951 Presence of aortocoronary bypass graft: Secondary | ICD-10-CM | POA: Diagnosis not present

## 2011-12-08 DIAGNOSIS — I2510 Atherosclerotic heart disease of native coronary artery without angina pectoris: Secondary | ICD-10-CM | POA: Diagnosis not present

## 2011-12-08 DIAGNOSIS — Z5189 Encounter for other specified aftercare: Secondary | ICD-10-CM | POA: Diagnosis not present

## 2011-12-10 DIAGNOSIS — I2510 Atherosclerotic heart disease of native coronary artery without angina pectoris: Secondary | ICD-10-CM | POA: Diagnosis not present

## 2011-12-10 DIAGNOSIS — Z5189 Encounter for other specified aftercare: Secondary | ICD-10-CM | POA: Diagnosis not present

## 2011-12-10 DIAGNOSIS — I10 Essential (primary) hypertension: Secondary | ICD-10-CM | POA: Diagnosis not present

## 2011-12-10 DIAGNOSIS — Z951 Presence of aortocoronary bypass graft: Secondary | ICD-10-CM | POA: Diagnosis not present

## 2011-12-10 DIAGNOSIS — E119 Type 2 diabetes mellitus without complications: Secondary | ICD-10-CM | POA: Diagnosis not present

## 2011-12-29 DIAGNOSIS — E559 Vitamin D deficiency, unspecified: Secondary | ICD-10-CM | POA: Diagnosis not present

## 2011-12-29 DIAGNOSIS — E785 Hyperlipidemia, unspecified: Secondary | ICD-10-CM | POA: Diagnosis not present

## 2012-01-04 DIAGNOSIS — E785 Hyperlipidemia, unspecified: Secondary | ICD-10-CM | POA: Diagnosis not present

## 2012-01-04 DIAGNOSIS — I2510 Atherosclerotic heart disease of native coronary artery without angina pectoris: Secondary | ICD-10-CM | POA: Diagnosis not present

## 2012-01-04 DIAGNOSIS — E559 Vitamin D deficiency, unspecified: Secondary | ICD-10-CM | POA: Diagnosis not present

## 2012-01-04 DIAGNOSIS — E1169 Type 2 diabetes mellitus with other specified complication: Secondary | ICD-10-CM | POA: Diagnosis not present

## 2012-02-23 DIAGNOSIS — E119 Type 2 diabetes mellitus without complications: Secondary | ICD-10-CM | POA: Diagnosis not present

## 2012-02-23 DIAGNOSIS — E7800 Pure hypercholesterolemia, unspecified: Secondary | ICD-10-CM | POA: Diagnosis not present

## 2012-02-23 DIAGNOSIS — I10 Essential (primary) hypertension: Secondary | ICD-10-CM | POA: Diagnosis not present

## 2012-03-01 DIAGNOSIS — G4733 Obstructive sleep apnea (adult) (pediatric): Secondary | ICD-10-CM | POA: Diagnosis not present

## 2012-03-01 DIAGNOSIS — I10 Essential (primary) hypertension: Secondary | ICD-10-CM | POA: Diagnosis not present

## 2012-03-01 DIAGNOSIS — I259 Chronic ischemic heart disease, unspecified: Secondary | ICD-10-CM | POA: Diagnosis not present

## 2012-03-01 DIAGNOSIS — E119 Type 2 diabetes mellitus without complications: Secondary | ICD-10-CM | POA: Diagnosis not present

## 2012-03-01 DIAGNOSIS — I509 Heart failure, unspecified: Secondary | ICD-10-CM | POA: Diagnosis not present

## 2012-03-01 DIAGNOSIS — E7800 Pure hypercholesterolemia, unspecified: Secondary | ICD-10-CM | POA: Diagnosis not present

## 2012-03-01 DIAGNOSIS — N400 Enlarged prostate without lower urinary tract symptoms: Secondary | ICD-10-CM | POA: Diagnosis not present

## 2012-03-28 DIAGNOSIS — E119 Type 2 diabetes mellitus without complications: Secondary | ICD-10-CM | POA: Diagnosis not present

## 2012-03-28 DIAGNOSIS — I5022 Chronic systolic (congestive) heart failure: Secondary | ICD-10-CM | POA: Diagnosis not present

## 2012-03-28 DIAGNOSIS — I2510 Atherosclerotic heart disease of native coronary artery without angina pectoris: Secondary | ICD-10-CM | POA: Diagnosis not present

## 2012-04-07 DIAGNOSIS — E559 Vitamin D deficiency, unspecified: Secondary | ICD-10-CM | POA: Diagnosis not present

## 2012-04-07 DIAGNOSIS — E785 Hyperlipidemia, unspecified: Secondary | ICD-10-CM | POA: Diagnosis not present

## 2012-04-07 DIAGNOSIS — E1169 Type 2 diabetes mellitus with other specified complication: Secondary | ICD-10-CM | POA: Diagnosis not present

## 2012-04-27 DIAGNOSIS — Z23 Encounter for immunization: Secondary | ICD-10-CM | POA: Diagnosis not present

## 2012-04-27 DIAGNOSIS — E559 Vitamin D deficiency, unspecified: Secondary | ICD-10-CM | POA: Diagnosis not present

## 2012-04-27 DIAGNOSIS — E785 Hyperlipidemia, unspecified: Secondary | ICD-10-CM | POA: Diagnosis not present

## 2012-04-27 DIAGNOSIS — E1169 Type 2 diabetes mellitus with other specified complication: Secondary | ICD-10-CM | POA: Diagnosis not present

## 2012-04-27 DIAGNOSIS — I2510 Atherosclerotic heart disease of native coronary artery without angina pectoris: Secondary | ICD-10-CM | POA: Diagnosis not present

## 2012-07-18 DIAGNOSIS — I509 Heart failure, unspecified: Secondary | ICD-10-CM | POA: Diagnosis not present

## 2012-07-18 DIAGNOSIS — E119 Type 2 diabetes mellitus without complications: Secondary | ICD-10-CM | POA: Diagnosis not present

## 2012-07-18 DIAGNOSIS — I4510 Unspecified right bundle-branch block: Secondary | ICD-10-CM | POA: Diagnosis not present

## 2012-07-18 DIAGNOSIS — I2510 Atherosclerotic heart disease of native coronary artery without angina pectoris: Secondary | ICD-10-CM | POA: Diagnosis not present

## 2012-07-29 ENCOUNTER — Ambulatory Visit (INDEPENDENT_AMBULATORY_CARE_PROVIDER_SITE_OTHER): Payer: Medicare Other | Admitting: Urology

## 2012-07-29 DIAGNOSIS — N401 Enlarged prostate with lower urinary tract symptoms: Secondary | ICD-10-CM

## 2012-07-29 DIAGNOSIS — R39198 Other difficulties with micturition: Secondary | ICD-10-CM | POA: Diagnosis not present

## 2012-08-02 DIAGNOSIS — E1169 Type 2 diabetes mellitus with other specified complication: Secondary | ICD-10-CM | POA: Diagnosis not present

## 2012-08-02 DIAGNOSIS — E559 Vitamin D deficiency, unspecified: Secondary | ICD-10-CM | POA: Diagnosis not present

## 2012-08-02 DIAGNOSIS — E785 Hyperlipidemia, unspecified: Secondary | ICD-10-CM | POA: Diagnosis not present

## 2012-08-03 ENCOUNTER — Other Ambulatory Visit (HOSPITAL_COMMUNITY): Payer: Self-pay | Admitting: Cardiovascular Disease

## 2012-08-03 DIAGNOSIS — I729 Aneurysm of unspecified site: Secondary | ICD-10-CM

## 2012-08-12 ENCOUNTER — Encounter (HOSPITAL_COMMUNITY): Payer: Medicare Other

## 2012-08-18 ENCOUNTER — Ambulatory Visit (HOSPITAL_COMMUNITY)
Admission: RE | Admit: 2012-08-18 | Discharge: 2012-08-18 | Disposition: A | Discharge status: Home / Self Care | Payer: Medicare Other | Source: Ambulatory Visit | Attending: Cardiovascular Disease | Admitting: Cardiovascular Disease

## 2012-08-18 DIAGNOSIS — I729 Aneurysm of unspecified site: Secondary | ICD-10-CM | POA: Insufficient documentation

## 2012-08-18 DIAGNOSIS — R0989 Other specified symptoms and signs involving the circulatory and respiratory systems: Secondary | ICD-10-CM | POA: Diagnosis not present

## 2012-08-18 NOTE — Progress Notes (Signed)
Aorta Duplex Completed. Dosha Broshears D  

## 2012-09-20 DIAGNOSIS — E785 Hyperlipidemia, unspecified: Secondary | ICD-10-CM | POA: Diagnosis not present

## 2012-09-20 DIAGNOSIS — I2510 Atherosclerotic heart disease of native coronary artery without angina pectoris: Secondary | ICD-10-CM | POA: Diagnosis not present

## 2012-09-20 DIAGNOSIS — Z79899 Other long term (current) drug therapy: Secondary | ICD-10-CM | POA: Diagnosis not present

## 2012-09-20 DIAGNOSIS — E1169 Type 2 diabetes mellitus with other specified complication: Secondary | ICD-10-CM | POA: Diagnosis not present

## 2012-09-20 DIAGNOSIS — E559 Vitamin D deficiency, unspecified: Secondary | ICD-10-CM | POA: Diagnosis not present

## 2012-10-17 DIAGNOSIS — E785 Hyperlipidemia, unspecified: Secondary | ICD-10-CM | POA: Diagnosis not present

## 2012-10-17 DIAGNOSIS — Z79899 Other long term (current) drug therapy: Secondary | ICD-10-CM | POA: Diagnosis not present

## 2012-10-17 DIAGNOSIS — E1169 Type 2 diabetes mellitus with other specified complication: Secondary | ICD-10-CM | POA: Diagnosis not present

## 2012-10-17 DIAGNOSIS — E538 Deficiency of other specified B group vitamins: Secondary | ICD-10-CM | POA: Diagnosis not present

## 2012-10-17 DIAGNOSIS — I2510 Atherosclerotic heart disease of native coronary artery without angina pectoris: Secondary | ICD-10-CM | POA: Diagnosis not present

## 2012-10-17 DIAGNOSIS — E559 Vitamin D deficiency, unspecified: Secondary | ICD-10-CM | POA: Diagnosis not present

## 2012-10-28 ENCOUNTER — Ambulatory Visit: Payer: Medicare Other | Admitting: Urology

## 2012-11-07 DIAGNOSIS — E782 Mixed hyperlipidemia: Secondary | ICD-10-CM | POA: Diagnosis not present

## 2012-11-07 DIAGNOSIS — Z951 Presence of aortocoronary bypass graft: Secondary | ICD-10-CM | POA: Diagnosis not present

## 2012-11-07 DIAGNOSIS — E119 Type 2 diabetes mellitus without complications: Secondary | ICD-10-CM | POA: Diagnosis not present

## 2012-11-07 DIAGNOSIS — I2510 Atherosclerotic heart disease of native coronary artery without angina pectoris: Secondary | ICD-10-CM | POA: Diagnosis not present

## 2012-11-10 ENCOUNTER — Ambulatory Visit (HOSPITAL_COMMUNITY)
Admission: RE | Admit: 2012-11-10 | Discharge: 2012-11-10 | Disposition: A | Discharge status: Home / Self Care | Payer: Medicare Other | Source: Ambulatory Visit | Attending: Cardiovascular Disease | Admitting: Cardiovascular Disease

## 2012-11-10 DIAGNOSIS — I2510 Atherosclerotic heart disease of native coronary artery without angina pectoris: Secondary | ICD-10-CM | POA: Diagnosis not present

## 2012-11-10 DIAGNOSIS — I2589 Other forms of chronic ischemic heart disease: Secondary | ICD-10-CM | POA: Insufficient documentation

## 2012-11-10 DIAGNOSIS — E119 Type 2 diabetes mellitus without complications: Secondary | ICD-10-CM | POA: Diagnosis not present

## 2012-11-10 DIAGNOSIS — I10 Essential (primary) hypertension: Secondary | ICD-10-CM | POA: Diagnosis not present

## 2012-11-10 HISTORY — PX: TRANSTHORACIC ECHOCARDIOGRAM: SHX275

## 2012-11-10 NOTE — Progress Notes (Signed)
*  PRELIMINARY RESULTS* Echocardiogram 2D Echocardiogram has been performed.  [name] [name] 11/10/2012, 3:27 PM

## 2012-11-14 DIAGNOSIS — E559 Vitamin D deficiency, unspecified: Secondary | ICD-10-CM | POA: Diagnosis not present

## 2012-11-14 DIAGNOSIS — I2510 Atherosclerotic heart disease of native coronary artery without angina pectoris: Secondary | ICD-10-CM | POA: Diagnosis not present

## 2012-11-14 DIAGNOSIS — E1169 Type 2 diabetes mellitus with other specified complication: Secondary | ICD-10-CM | POA: Diagnosis not present

## 2012-11-14 DIAGNOSIS — E785 Hyperlipidemia, unspecified: Secondary | ICD-10-CM | POA: Diagnosis not present

## 2012-11-14 DIAGNOSIS — E538 Deficiency of other specified B group vitamins: Secondary | ICD-10-CM | POA: Diagnosis not present

## 2012-11-14 DIAGNOSIS — Z79899 Other long term (current) drug therapy: Secondary | ICD-10-CM | POA: Diagnosis not present

## 2013-02-03 IMAGING — CR DG CHEST 1V PORT
1 series · 1 of 1 positions shown · non-contrast
Comparison: Portable exam 6251 hours compared to 07/30/2011

CLINICAL DATA: Post CABG

PORTABLE CHEST - 1 VIEW

[view not recorded]
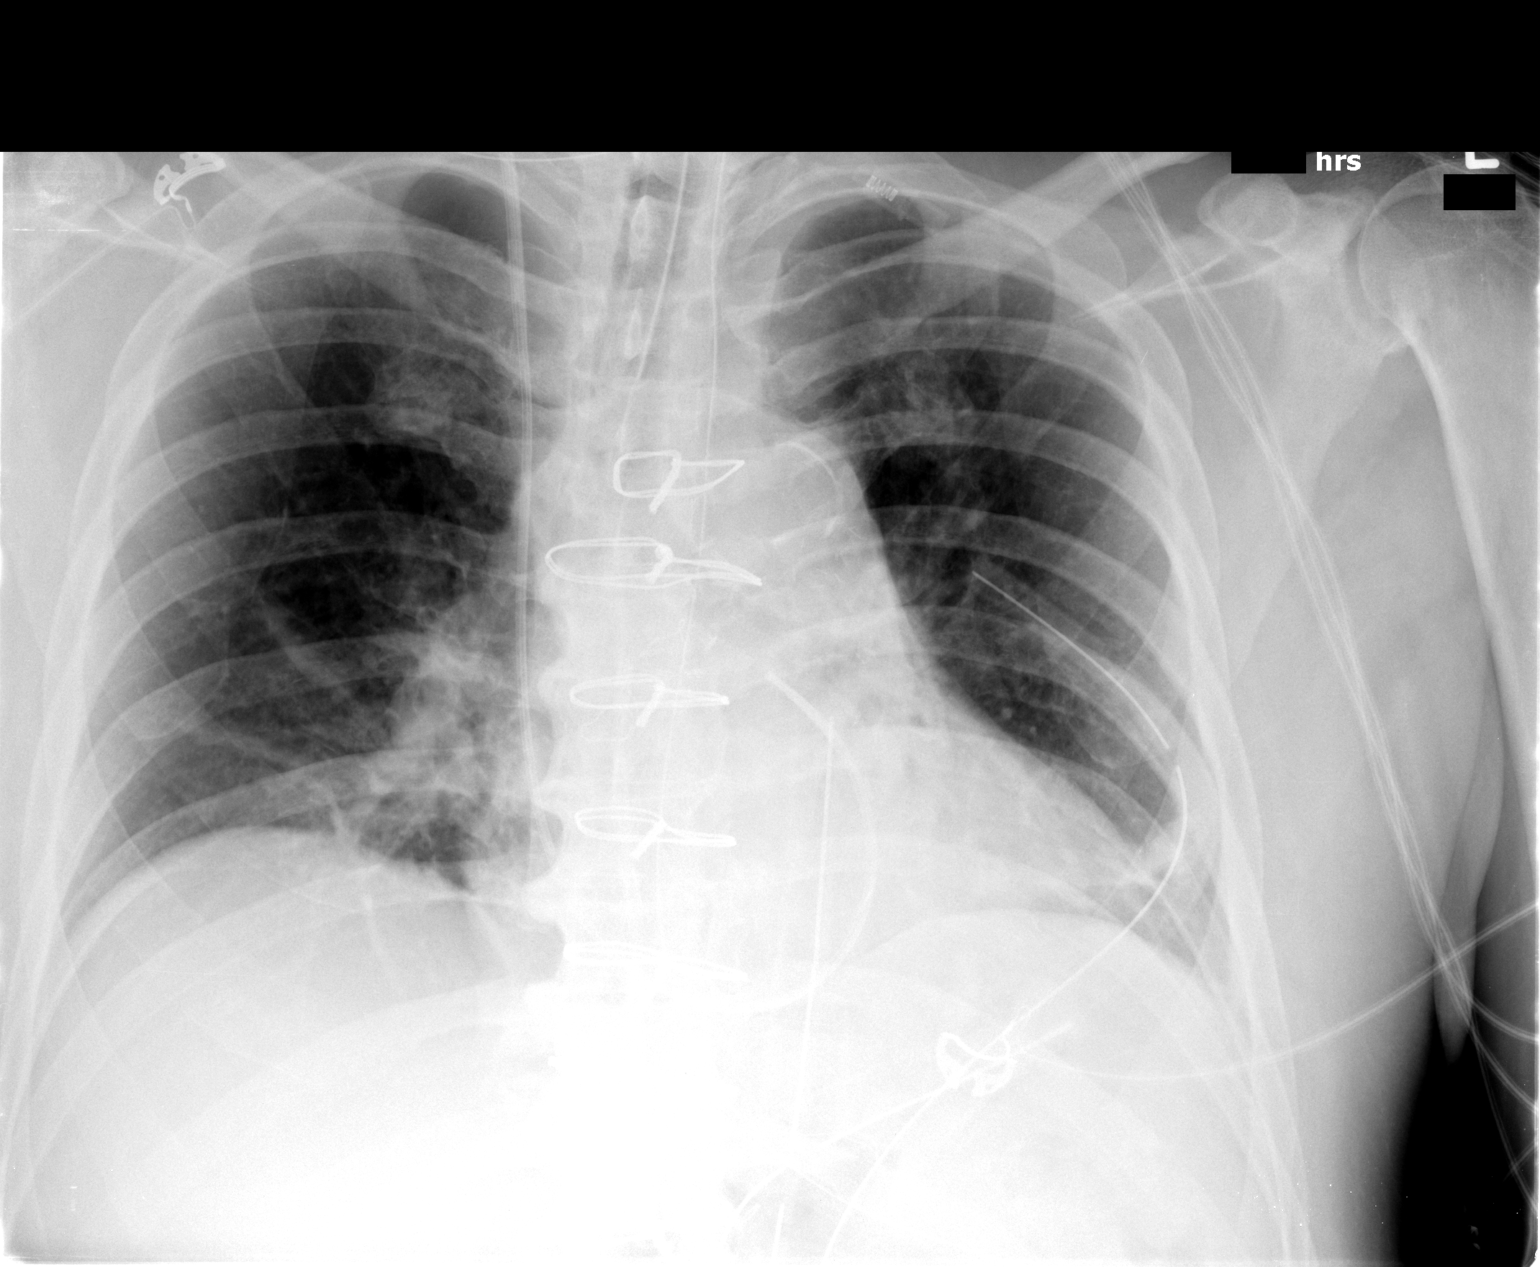

[1 of 1 positions shown; findings below may reference images not displayed]

FINDINGS: Tip of endotracheal tube 2.4 cm above carina.
Nasogastric tube extends into stomach.
Mediastinal drain and left thoracostomy tube present.
Left jugular Swan-Ganz catheter, tip projecting over right
pulmonary artery.
Upper normal-sized cardiac silhouette.
Atherosclerotic calcification aorta.
Bibasilar atelectasis.
No pulmonary infiltrate, gross pleural effusion or pneumothorax.
IMPRESSION: Line and tube positions as above.
Bibasilar atelectasis.

## 2013-02-04 IMAGING — CR DG CHEST 1V PORT
1 series · 1 of 1 positions shown · non-contrast
Comparison: Chest radiograph 08/03/2011

CLINICAL DATA: Postop open heart surgery

PORTABLE CHEST - 1 VIEW

[AP]
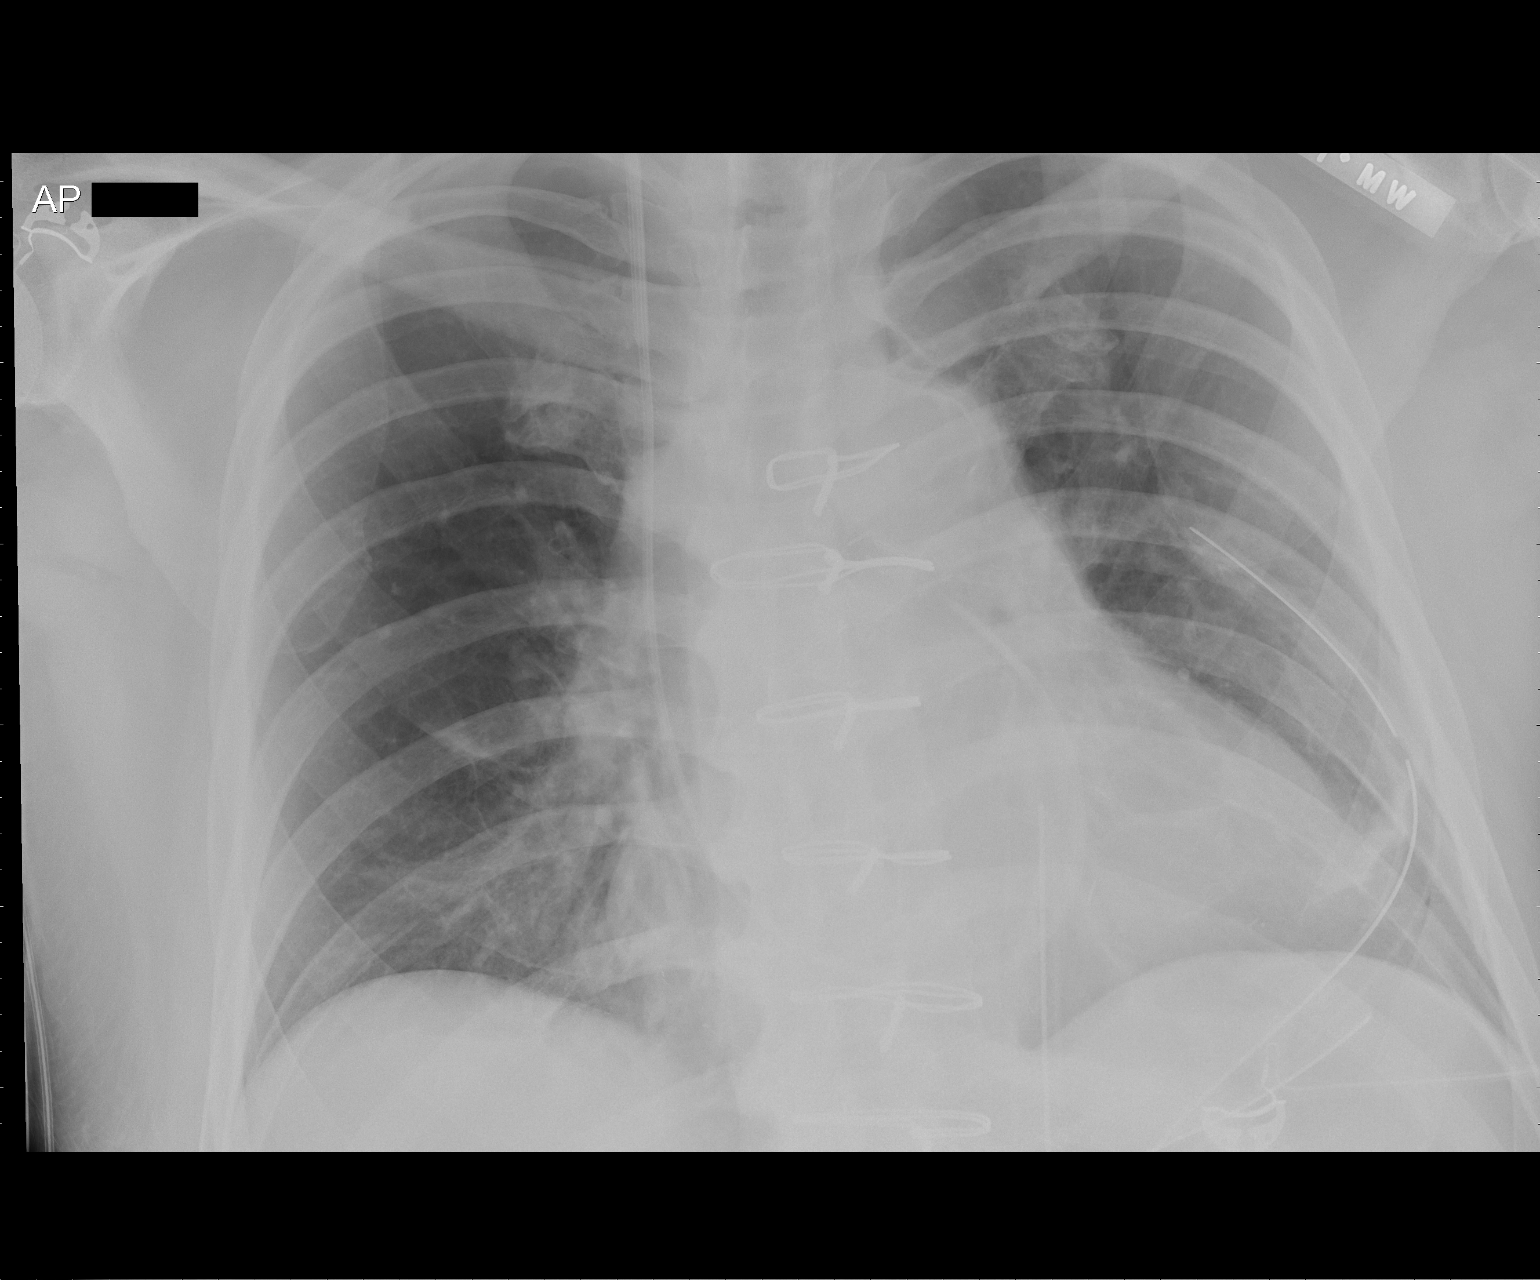

[1 of 1 positions shown; findings below may reference images not displayed]

FINDINGS: Interval removal of endotracheal tube.  Swan-Ganz
catheter, mediastinal drain, the left chest tubes remain.  No
pneumothorax.  Mild left basilar atelectasis.  Cardiomegaly.
IMPRESSION: 1.  Interval extubation without complication.
2.  Mild basilar atelectasis and cardiomegaly.

## 2013-02-05 IMAGING — CR DG CHEST 1V PORT
1 series · 1 of 1 positions shown · non-contrast
Comparison: Chest 08/04/2011.

CLINICAL DATA: Status post CABG.

PORTABLE CHEST - 1 VIEW

[view not recorded]
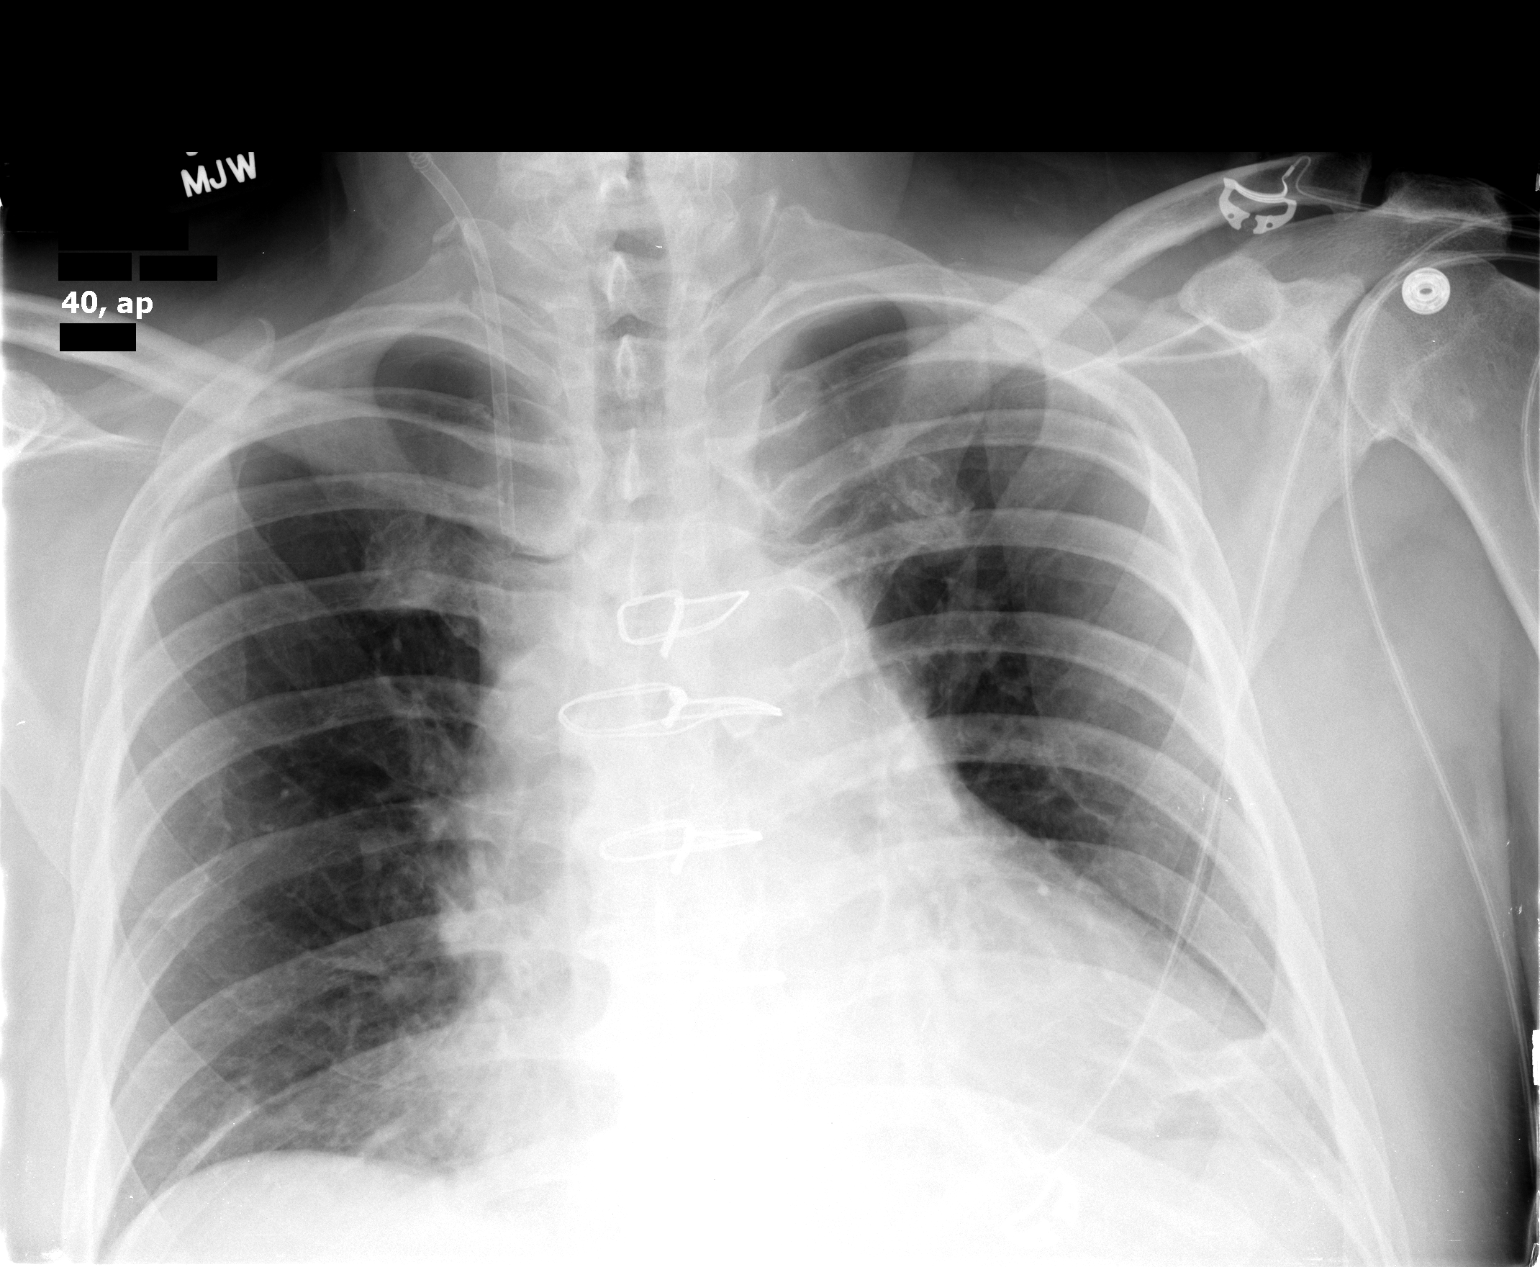

[1 of 1 positions shown; findings below may reference images not displayed]

FINDINGS: Right IJ approach Swan-Ganz catheter have been removed
with a sheath still in place.  Mediastinal drain and left chest
tube have also been removed.  No pneumothorax.  The patient has a
small left effusion.  Subsegmental basilar atelectasis is noted.
There is cardiomegaly.
IMPRESSION: Support apparatus as described.  No pneumothorax.

Small left effusion and mild basilar subsegmental atelectasis.

## 2013-02-20 ENCOUNTER — Other Ambulatory Visit: Payer: Self-pay | Admitting: Cardiovascular Disease

## 2013-02-20 ENCOUNTER — Other Ambulatory Visit: Payer: Self-pay | Admitting: *Deleted

## 2013-02-20 DIAGNOSIS — I4510 Unspecified right bundle-branch block: Secondary | ICD-10-CM | POA: Diagnosis not present

## 2013-02-20 DIAGNOSIS — I2510 Atherosclerotic heart disease of native coronary artery without angina pectoris: Secondary | ICD-10-CM | POA: Diagnosis not present

## 2013-02-20 DIAGNOSIS — E119 Type 2 diabetes mellitus without complications: Secondary | ICD-10-CM | POA: Diagnosis not present

## 2013-02-20 DIAGNOSIS — R6889 Other general symptoms and signs: Secondary | ICD-10-CM | POA: Diagnosis not present

## 2013-02-20 DIAGNOSIS — I739 Peripheral vascular disease, unspecified: Secondary | ICD-10-CM

## 2013-02-20 DIAGNOSIS — R0602 Shortness of breath: Secondary | ICD-10-CM | POA: Diagnosis not present

## 2013-02-20 DIAGNOSIS — R5381 Other malaise: Secondary | ICD-10-CM | POA: Diagnosis not present

## 2013-02-20 DIAGNOSIS — Z951 Presence of aortocoronary bypass graft: Secondary | ICD-10-CM | POA: Diagnosis not present

## 2013-02-20 LAB — CBC WITH DIFFERENTIAL/PLATELET
Eosinophils Relative: 4 % (ref 0–5)
HCT: 40.1 % (ref 39.0–52.0)
Hemoglobin: 13.8 g/dL (ref 13.0–17.0)
Lymphocytes Relative: 28 % (ref 12–46)
Lymphs Abs: 1.9 10*3/uL (ref 0.7–4.0)
MCV: 85.3 fL (ref 78.0–100.0)
Monocytes Absolute: 0.5 10*3/uL (ref 0.1–1.0)
Monocytes Relative: 7 % (ref 3–12)
Neutro Abs: 4 10*3/uL (ref 1.7–7.7)
WBC: 6.7 10*3/uL (ref 4.0–10.5)

## 2013-02-20 LAB — COMPREHENSIVE METABOLIC PANEL
AST: 14 U/L (ref 0–37)
Albumin: 4.3 g/dL (ref 3.5–5.2)
BUN: 14 mg/dL (ref 6–23)
CO2: 25 mEq/L (ref 19–32)
Calcium: 8.8 mg/dL (ref 8.4–10.5)
Chloride: 104 mEq/L (ref 96–112)
Glucose, Bld: 183 mg/dL — ABNORMAL HIGH (ref 70–99)
Potassium: 4.6 mEq/L (ref 3.5–5.3)

## 2013-02-20 LAB — TSH: TSH: 1.467 u[IU]/mL (ref 0.350–4.500)

## 2013-02-20 LAB — HEMOGLOBIN A1C
Hgb A1c MFr Bld: 7.3 % — ABNORMAL HIGH (ref <5.7)
Mean Plasma Glucose: 163 mg/dL — ABNORMAL HIGH (ref <117)

## 2013-02-21 ENCOUNTER — Telehealth (HOSPITAL_COMMUNITY): Payer: Self-pay | Admitting: Cardiovascular Disease

## 2013-02-22 ENCOUNTER — Encounter (HOSPITAL_COMMUNITY): Payer: Self-pay | Admitting: Cardiovascular Disease

## 2013-02-24 ENCOUNTER — Ambulatory Visit: Payer: Medicare Other | Admitting: Urology

## 2013-03-02 ENCOUNTER — Other Ambulatory Visit: Payer: Self-pay | Admitting: *Deleted

## 2013-03-02 MED ORDER — SPIRONOLACTONE 25 MG PO TABS: 12.5000 mg | ORAL_TABLET | Freq: Every day | ORAL | Status: DC

## 2013-03-02 NOTE — Telephone Encounter (Signed)
Rx was sent to pharmacy electronically via [employer] 

## 2013-03-07 ENCOUNTER — Encounter: Payer: Self-pay | Admitting: Cardiovascular Disease

## 2013-03-16 ENCOUNTER — Ambulatory Visit (HOSPITAL_COMMUNITY)
Admission: RE | Admit: 2013-03-16 | Discharge: 2013-03-16 | Disposition: A | Discharge status: Home / Self Care | Payer: Medicare Other | Source: Ambulatory Visit | Attending: Internal Medicine | Admitting: Internal Medicine

## 2013-03-16 DIAGNOSIS — I739 Peripheral vascular disease, unspecified: Secondary | ICD-10-CM | POA: Diagnosis not present

## 2013-03-16 HISTORY — PX: OTHER SURGICAL HISTORY: SHX169

## 2013-03-16 NOTE — Progress Notes (Signed)
Lower Extremity Arterial Duplex Completed. °Brianna L Mazza,RVT °

## 2013-05-29 ENCOUNTER — Encounter: Payer: Self-pay | Admitting: *Deleted

## 2013-06-02 ENCOUNTER — Encounter: Payer: Self-pay | Admitting: Internal Medicine

## 2013-06-02 ENCOUNTER — Ambulatory Visit (INDEPENDENT_AMBULATORY_CARE_PROVIDER_SITE_OTHER): Payer: Medicare Other | Admitting: Internal Medicine

## 2013-06-02 VITALS — BP 120/62 | HR 68 | Ht 74.0 in | Wt 249.2 lb

## 2013-06-02 DIAGNOSIS — I2510 Atherosclerotic heart disease of native coronary artery without angina pectoris: Secondary | ICD-10-CM

## 2013-06-02 DIAGNOSIS — I2589 Other forms of chronic ischemic heart disease: Secondary | ICD-10-CM | POA: Diagnosis not present

## 2013-06-02 DIAGNOSIS — Z951 Presence of aortocoronary bypass graft: Secondary | ICD-10-CM

## 2013-06-02 DIAGNOSIS — I255 Ischemic cardiomyopathy: Secondary | ICD-10-CM

## 2013-06-02 DIAGNOSIS — I10 Essential (primary) hypertension: Secondary | ICD-10-CM | POA: Diagnosis not present

## 2013-06-02 DIAGNOSIS — E119 Type 2 diabetes mellitus without complications: Secondary | ICD-10-CM

## 2013-06-02 NOTE — Patient Instructions (Signed)
Try Colace (stool softener) - you may would want to schedule another colonoscopy.   Your physician wants you to follow-up in: 6 months with Dr. [name]. You will receive a reminder letter in the mail two months in advance. If you don't receive a letter, please call our office to schedule the follow-up appointment.

## 2013-06-02 NOTE — Progress Notes (Signed)
OFFICE NOTE  Chief Complaint:  Anxiety, mild dyspnea  Primary Care Physician: [name], MD  HPI:  [name] [name] is a pleasant 67 year old gentleman previously followed by Dr. Alanda Amass. His past medical history is significant for coronary artery disease. In 2013 he underwent multivessel CABG for an ischemic cardiomyopathy. EF was 20-25% prior to surgery however post surgery his EF had improved up to 45-50%. Recently his EF was 40-45% by echo in May of 2014. There was mild mitral regurgitation, mild to moderately dilated left atrium and mild LVH. [name] has been describing some anxiety. He also reports some pain in his legs however underwent Dopplers in September 2014 which showed preserved ABIs bilaterally a 1.0 on the right and 1.1 on the left. The bilateral peroneal and posterior tibial arteries were occluded. He reports some optimal control of his diabetes. His A1c was 7.2. He is concerned about the cost of taking Tradjenta. He is followed by Dr. Kyra Searles.  Otherwise he denies any shortness of breath or worsening chest pain with exertion.  Finally he is describing problems with constipation and has noted a marked change in his bowel regimens recently. He does take fiber but is not on a stool softener. His last colonoscopy was a number of years ago.  PMHx:  Past Medical History  Diagnosis Date  . Diabetes mellitus   . Coronary artery disease   . High cholesterol   . Hypertension   . Stroke, Wallenberg's syndrome   . Right bundle branch block   . Ischemic cardiomyopathy   . Sleep apnea     sleep study 10/2010- AHI during total sleep 32.1/hr and during REM 62.3/hr (severe sleep apnea)  . CAD (coronary artery disease) 07/06/2011  . LV dysfunction 07/06/2011  . Stroke     Wallenberg   . CHF (congestive heart failure)   . Chronic kidney disease     enlarged prostate  . Arthritis     Knee L - probably  . S/P CABG (coronary artery bypass graft) 08/03/2011    x3; LIMA to LAD,,  SVG to PDA, SVG to posterolateral branch of RCA; Dr. Wayland Salinas  . BPH (benign prostatic hyperplasia)   . History of nuclear stress test 09/2010    dipyridamole; mild perfusion defect due to attenuation with mild superimposed ischemia in apical septal, apical, apical inferior & apical lateral regions; rest LV enlarged in size; prominent gut uptake in infero-apical region; no significant ischemia demonstrated; low risk scan     Past Surgical History  Procedure Laterality Date  . Cardiac catheterization  01/2011    ischemic cardiomyopathy 30-35%, multivessel CAD (Dr. Bishop Limbo)   . Pilonidal cyst removed    . Colonscopy    . Coronary artery bypass graft  08/03/2011    Procedure: CORONARY ARTERY BYPASS GRAFTING (CABG);  Surgeon: Alleen Borne, MD;  Location: [facility] OR;  Service: Open Heart Surgery;  Laterality: N/A;  CABG times three using right saphenous vein and left mammary artery usisng endoscope.  . Transthoracic echocardiogram  11/10/2012    EF 40-45%, mild LVH, mild hypokinesis of anteroseptal myocardium, grade 1 diastolic dysfunction; mild MR & calcifed mitral annulus; LA mild-mod dilated; RA mildly dilated  . Lower extremity arterial doppler  03/16/2013    bilat ABIs demonstated normal values; R runoff - posterior tibial & anterior tibial arteries occluded; L runoff - peroneal & posterior tibial arteries occluded, anterior tibial artery appears occluded    FAMHx:  Family History  Problem Relation  Age of Onset  . Anesthesia problems Daughter   . Cancer Mother   . Lung disease Father     & heart disease  . Colon cancer Sister   . Sudden death Maternal Grandmother     SOCHx:   reports that he has quit smoking. His smoking use included Cigars. He quit smokeless tobacco use about 3 years ago. He reports that he drinks alcohol. He reports that he does not use illicit drugs.  ALLERGIES:  No Known Allergies  ROS: A comprehensive review of systems was negative except for: Constitutional:  positive for fatigue Respiratory: positive for dyspnea on exertion Behavioral/Psych: positive for anxiety  HOME MEDS: Current Outpatient Prescriptions  Medication Sig Dispense Refill  . aspirin 325 MG tablet Take 325 mg by mouth daily.      . carvedilol (COREG) 6.25 MG tablet Take 6.25 mg by mouth 2 (two) times daily with a meal.      . Cholecalciferol (VITAMIN D3) 5000 UNITS CAPS Take 1 capsule by mouth daily.      Marland Kitchen Corn Dextrin (EQL FIBER SUPPLEMENT PO) Take by mouth at bedtime.      Marland Kitchen glipiZIDE (GLUCOTROL XL) 10 MG 24 hr tablet Take 10 mg by mouth every morning.       . linagliptin (TRADJENTA) 5 MG TABS tablet Take 5 mg by mouth daily.      . metFORMIN (GLUMETZA) 500 MG (MOD) 24 hr tablet Take 500 mg by mouth 2 (two) times daily.       . ramipril (ALTACE) 5 MG capsule Take 5 mg by mouth daily.       . rosuvastatin (CRESTOR) 40 MG tablet Take 20 mg by mouth at bedtime.       Marland Kitchen spironolactone (ALDACTONE) 25 MG tablet Take 0.5 tablets (12.5 mg total) by mouth daily.  15 tablet  10   No current facility-administered medications for this visit.    LABS/IMAGING: No results found for this or any previous visit (from the past 48 hour(s)). No results found.  VITALS: BP 120/62  Pulse 68  Ht 6\' 2"  (1.88 m)  Wt 249 lb 3.2 oz (113.036 kg)  BMI 31.98 kg/m2  EXAM: General appearance: alert and no distress Neck: no carotid bruit and no JVD Lungs: clear to auscultation bilaterally Heart: regular rate and rhythm, S1, S2 normal, no murmur, click, rub or gallop Abdomen: soft, non-tender; bowel sounds normal; no masses,  no organomegaly Extremities: extremities normal, atraumatic, no cyanosis or edema Pulses: 2+ and symmetric Skin: Skin color, texture, turgor normal. No rashes or lesions Neurologic: Grossly normal Psych: Mood, affect normal  EKG: Normal sinus rhythm at 68, nonspecific IVCD, and nonspecific T wave changes  ASSESSMENT: 1. Coronary artery disease status post three-vessel  CABG in 2013 (LIMA to LAD, SVG to PDA and SVG to PLA) 2. Hypertension-controlled 3. Dyslipidemia 4. Diabetes type 2 5. Mild PVD-with normal ABI  PLAN: 1.   Mr. Gift is doing fairly well except for some occasional shortness of breath. He also is having problems with his legs and particularly his knees including symptoms of pseudo-claudication. I recommended that he followup with you for this but may benefit from seeing an orthopedist. He is hesitant to acknowledge that he is having these issues, but his daughter who was with him today reported these are significant problems for him. From a cardiac standpoint he seems to be doing very well and is on an excellent regimen as established by Dr. Alanda Amass. With regards to his  constipation, recommended adding a stool softener and he should consider a repeat colonoscopy as he is likely due for this. Plan to see him back in 6 months.  [name], MD, [facility] Attending Cardiologist [facility]  Quindon Denker C 06/02/2013, 1:19 PM

## 2013-07-05 DIAGNOSIS — I509 Heart failure, unspecified: Secondary | ICD-10-CM | POA: Diagnosis not present

## 2013-07-05 DIAGNOSIS — E7800 Pure hypercholesterolemia, unspecified: Secondary | ICD-10-CM | POA: Diagnosis not present

## 2013-07-05 DIAGNOSIS — I10 Essential (primary) hypertension: Secondary | ICD-10-CM | POA: Diagnosis not present

## 2013-07-05 DIAGNOSIS — N400 Enlarged prostate without lower urinary tract symptoms: Secondary | ICD-10-CM | POA: Diagnosis not present

## 2013-07-05 DIAGNOSIS — E119 Type 2 diabetes mellitus without complications: Secondary | ICD-10-CM | POA: Diagnosis not present

## 2013-07-05 DIAGNOSIS — I259 Chronic ischemic heart disease, unspecified: Secondary | ICD-10-CM | POA: Diagnosis not present

## 2013-07-05 DIAGNOSIS — G4733 Obstructive sleep apnea (adult) (pediatric): Secondary | ICD-10-CM | POA: Diagnosis not present

## 2013-08-04 ENCOUNTER — Emergency Department (HOSPITAL_COMMUNITY)
Admission: EM | Admit: 2013-08-04 | Discharge: 2013-08-04 | Disposition: A | Discharge status: Home / Self Care | Payer: Medicare Other | Attending: Emergency Medicine | Admitting: Emergency Medicine

## 2013-08-04 ENCOUNTER — Encounter (HOSPITAL_COMMUNITY): Payer: Self-pay | Admitting: Emergency Medicine

## 2013-08-04 ENCOUNTER — Telehealth: Payer: Self-pay | Admitting: Internal Medicine

## 2013-08-04 ENCOUNTER — Telehealth: Payer: Self-pay | Admitting: *Deleted

## 2013-08-04 DIAGNOSIS — R5383 Other fatigue: Principal | ICD-10-CM

## 2013-08-04 DIAGNOSIS — Z8739 Personal history of other diseases of the musculoskeletal system and connective tissue: Secondary | ICD-10-CM | POA: Insufficient documentation

## 2013-08-04 DIAGNOSIS — E7800 Pure hypercholesterolemia, unspecified: Secondary | ICD-10-CM | POA: Diagnosis not present

## 2013-08-04 DIAGNOSIS — R6889 Other general symptoms and signs: Secondary | ICD-10-CM | POA: Insufficient documentation

## 2013-08-04 DIAGNOSIS — Z8673 Personal history of transient ischemic attack (TIA), and cerebral infarction without residual deficits: Secondary | ICD-10-CM | POA: Diagnosis not present

## 2013-08-04 DIAGNOSIS — Z7982 Long term (current) use of aspirin: Secondary | ICD-10-CM | POA: Diagnosis not present

## 2013-08-04 DIAGNOSIS — I129 Hypertensive chronic kidney disease with stage 1 through stage 4 chronic kidney disease, or unspecified chronic kidney disease: Secondary | ICD-10-CM | POA: Diagnosis not present

## 2013-08-04 DIAGNOSIS — E119 Type 2 diabetes mellitus without complications: Secondary | ICD-10-CM | POA: Insufficient documentation

## 2013-08-04 DIAGNOSIS — Z9189 Other specified personal risk factors, not elsewhere classified: Secondary | ICD-10-CM | POA: Insufficient documentation

## 2013-08-04 DIAGNOSIS — R209 Unspecified disturbances of skin sensation: Secondary | ICD-10-CM | POA: Insufficient documentation

## 2013-08-04 DIAGNOSIS — R42 Dizziness and giddiness: Secondary | ICD-10-CM | POA: Insufficient documentation

## 2013-08-04 DIAGNOSIS — N189 Chronic kidney disease, unspecified: Secondary | ICD-10-CM | POA: Insufficient documentation

## 2013-08-04 DIAGNOSIS — Z9889 Other specified postprocedural states: Secondary | ICD-10-CM | POA: Insufficient documentation

## 2013-08-04 DIAGNOSIS — Z87891 Personal history of nicotine dependence: Secondary | ICD-10-CM | POA: Diagnosis not present

## 2013-08-04 DIAGNOSIS — R5381 Other malaise: Secondary | ICD-10-CM | POA: Diagnosis not present

## 2013-08-04 DIAGNOSIS — R635 Abnormal weight gain: Secondary | ICD-10-CM | POA: Insufficient documentation

## 2013-08-04 DIAGNOSIS — R630 Anorexia: Secondary | ICD-10-CM | POA: Diagnosis not present

## 2013-08-04 DIAGNOSIS — R110 Nausea: Secondary | ICD-10-CM | POA: Diagnosis not present

## 2013-08-04 DIAGNOSIS — R0602 Shortness of breath: Secondary | ICD-10-CM | POA: Diagnosis not present

## 2013-08-04 DIAGNOSIS — I2510 Atherosclerotic heart disease of native coronary artery without angina pectoris: Secondary | ICD-10-CM | POA: Diagnosis not present

## 2013-08-04 DIAGNOSIS — R531 Weakness: Secondary | ICD-10-CM

## 2013-08-04 DIAGNOSIS — I509 Heart failure, unspecified: Secondary | ICD-10-CM | POA: Insufficient documentation

## 2013-08-04 DIAGNOSIS — Z79899 Other long term (current) drug therapy: Secondary | ICD-10-CM | POA: Diagnosis not present

## 2013-08-04 DIAGNOSIS — Z951 Presence of aortocoronary bypass graft: Secondary | ICD-10-CM | POA: Insufficient documentation

## 2013-08-04 DIAGNOSIS — I10 Essential (primary) hypertension: Secondary | ICD-10-CM | POA: Diagnosis not present

## 2013-08-04 LAB — URINALYSIS, ROUTINE W REFLEX MICROSCOPIC
Bilirubin Urine: NEGATIVE
Glucose, UA: >1000 mg/dL — AB
Hgb urine dipstick: NEGATIVE
Ketones, ur: 15 mg/dL — AB
Leukocytes, UA: NEGATIVE
Nitrite: NEGATIVE
Protein, ur: NEGATIVE mg/dL
Specific Gravity, Urine: 1.02 (ref 1.005–1.030)
Urobilinogen, UA: 0.2 mg/dL (ref 0.0–1.0)
pH: 5.5 (ref 5.0–8.0)

## 2013-08-04 LAB — PROTIME-INR
INR: 1.07 (ref 0.00–1.49)
Prothrombin Time: 13.7 seconds (ref 11.6–15.2)

## 2013-08-04 LAB — GLUCOSE, CAPILLARY: Glucose-Capillary: 266 mg/dL — ABNORMAL HIGH (ref 70–99)

## 2013-08-04 LAB — CBC WITH DIFFERENTIAL/PLATELET
Basophils Absolute: 0 10*3/uL (ref 0.0–0.1)
Basophils Relative: 1 % (ref 0–1)
Eosinophils Absolute: 0.1 10*3/uL (ref 0.0–0.7)
Eosinophils Relative: 2 % (ref 0–5)
HCT: 35.3 % — ABNORMAL LOW (ref 39.0–52.0)
Hemoglobin: 12.5 g/dL — ABNORMAL LOW (ref 13.0–17.0)
Lymphocytes Relative: 23 % (ref 12–46)
Lymphs Abs: 0.7 10*3/uL (ref 0.7–4.0)
MCH: 29.8 pg (ref 26.0–34.0)
MCHC: 35.4 g/dL (ref 30.0–36.0)
MCV: 84 fL (ref 78.0–100.0)
Monocytes Absolute: 0.4 10*3/uL (ref 0.1–1.0)
Monocytes Relative: 13 % — ABNORMAL HIGH (ref 3–12)
Neutro Abs: 1.9 10*3/uL (ref 1.7–7.7)
Neutrophils Relative %: 61 % (ref 43–77)
Platelets: 255 10*3/uL (ref 150–400)
RBC: 4.2 MIL/uL — ABNORMAL LOW (ref 4.22–5.81)
RDW: 13.6 % (ref 11.5–15.5)
WBC: 3.1 10*3/uL — ABNORMAL LOW (ref 4.0–10.5)

## 2013-08-04 LAB — BASIC METABOLIC PANEL
BUN: 15 mg/dL (ref 6–23)
CO2: 21 mEq/L (ref 19–32)
Calcium: 8.7 mg/dL (ref 8.4–10.5)
Chloride: 99 mEq/L (ref 96–112)
Creatinine, Ser: 0.77 mg/dL (ref 0.50–1.35)
GFR calc Af Amer: >90 mL/min (ref >90)
GFR calc non Af Amer: >90 mL/min (ref >90)
Glucose, Bld: 280 mg/dL — ABNORMAL HIGH (ref 70–99)
Potassium: 4.1 mEq/L (ref 3.7–5.3)
Sodium: 136 mEq/L — ABNORMAL LOW (ref 137–147)

## 2013-08-04 LAB — URINE MICROSCOPIC-ADD ON

## 2013-08-04 LAB — TROPONIN I: Troponin I: <0.3 ng/mL (ref <0.30)

## 2013-08-04 LAB — APTT: aPTT: 33 seconds (ref 24–37)

## 2013-08-04 MED ORDER — SODIUM CHLORIDE 0.9 % IV BOLUS (SEPSIS)
1000.0000 mL | Freq: Once | INTRAVENOUS | Status: AC
  Administered 2013-08-04: 1000 mL via INTRAVENOUS

## 2013-08-04 NOTE — Discharge Instructions (Signed)
Stop taking your blood pressure medications (coreg and altace) until you see Dr. Debara Pickett.   Fatigue Fatigue is a feeling of tiredness, lack of energy, lack of motivation, or feeling tired all the time. Having enough rest, good nutrition, and reducing stress will normally reduce fatigue. Consult your caregiver if it persists. The nature of your fatigue will help your caregiver to find out its cause. The treatment is based on the cause.  CAUSES  There are many causes for fatigue. Most of the time, fatigue can be traced to one or more of your habits or routines. Most causes fit into one or more of three general areas. They are: Lifestyle problems  Sleep disturbances.  Overwork.  Physical exertion.  Unhealthy habits.  Poor eating habits or eating disorders.  Alcohol and/or drug use .  Lack of proper nutrition (malnutrition). Psychological problems  Stress and/or anxiety problems.  Depression.  Grief.  Boredom. Medical Problems or Conditions  Anemia.  Pregnancy.  Thyroid gland problems.  Recovery from major surgery.  Continuous pain.  Emphysema or asthma that is not well controlled  Allergic conditions.  Diabetes.  Infections (such as mononucleosis).  Obesity.  Sleep disorders, such as sleep apnea.  Heart failure or other heart-related problems.  Cancer.  Kidney disease.  Liver disease.  Effects of certain medicines such as antihistamines, cough and cold remedies, prescription pain medicines, heart and blood pressure medicines, drugs used for treatment of cancer, and some antidepressants. SYMPTOMS  The symptoms of fatigue include:   Lack of energy.  Lack of drive (motivation).  Drowsiness.  Feeling of indifference to the surroundings. DIAGNOSIS  The details of how you feel help guide your caregiver in finding out what is causing the fatigue. You will be asked about your present and past health condition. It is important to review all medicines that  you take, including prescription and non-prescription items. A thorough exam will be done. You will be questioned about your feelings, habits, and normal lifestyle. Your caregiver may suggest blood tests, urine tests, or other tests to look for common medical causes of fatigue.  TREATMENT  Fatigue is treated by correcting the underlying cause. For example, if you have continuous pain or depression, treating these causes will improve how you feel. Similarly, adjusting the dose of certain medicines will help in reducing fatigue.  HOME CARE INSTRUCTIONS   Try to get the required amount of good sleep every night.  Eat a healthy and nutritious diet, and drink enough water throughout the day.  Practice ways of relaxing (including yoga or meditation).  Exercise regularly.  Make plans to change situations that cause stress. Act on those plans so that stresses decrease over time. Keep your work and personal routine reasonable.  Avoid street drugs and minimize use of alcohol.  Start taking a daily multivitamin after consulting your caregiver. SEEK MEDICAL CARE IF:   You have persistent tiredness, which cannot be accounted for.  You have fever.  You have unintentional weight loss.  You have headaches.  You have disturbed sleep throughout the night.  You are feeling sad.  You have constipation.  You have dry skin.  You have gained weight.  You are taking any new or different medicines that you suspect are causing fatigue.  You are unable to sleep at night.  You develop any unusual swelling of your legs or other parts of your body. SEEK IMMEDIATE MEDICAL CARE IF:   You are feeling confused.  Your vision is blurred.  You feel  faint or pass out.  You develop severe headache.  You develop severe abdominal, pelvic, or back pain.  You develop chest pain, shortness of breath, or an irregular or fast heartbeat.  You are unable to pass a normal amount of urine.  You develop  abnormal bleeding such as bleeding from the rectum or you vomit blood.  You have thoughts about harming yourself or committing suicide.  You are worried that you might harm someone else. MAKE SURE YOU:   Understand these instructions.  Will watch your condition.  Will get help right away if you are not doing well or get worse. Document Released: 04/12/2007 Document Revised: 09/07/2011 Document Reviewed: 04/12/2007 [facility] Patient Information 2014 [place].

## 2013-08-04 NOTE — ED Notes (Signed)
CBG- [id number] 

## 2013-08-04 NOTE — Telephone Encounter (Signed)
Dr. Debara Pickett notified and advised pt should go to the hospital for evaluation.    Call to daughter and informed.  Verbalized understanding and agreed w/ plan.

## 2013-08-04 NOTE — Telephone Encounter (Signed)
Pt was calling back about her father. She is very concerned on what to do with her dad. She wants to know whether or not to take him to the hospital.  [facility]

## 2013-08-04 NOTE — Telephone Encounter (Signed)
Would recommend he go to the ER if he is symptomatic with a low blood pressure.  Dr. Debara Pickett

## 2013-08-04 NOTE — ED Notes (Signed)
Pt denies any pain, here for c/o general fatigue, states "on the verge of SOB when asked and on verge of nausea and dizziness", LBM today and normal per pt but did state that he had to strain for his first BM yesterday and second BM was normal

## 2013-08-04 NOTE — Telephone Encounter (Signed)
Pt's daughter [name] called stating that her father's BP was 108/50 this morning and he is very weak. She did not know what to do with him.  [place]

## 2013-08-04 NOTE — Telephone Encounter (Signed)
Returned call and pt verified x 2 w/ [name], pt's daughter.  Stated pt has not been feeling well for the past two days.  Stated he feels weak.  Stated BP was 118/6? Last night.  Stated pt felt worse this morning and only took carvedilol today.  BP was 108/50.  Stated the last time pt was like this he was admitted to [facility] for dehydration.  Stated he told her he has been staying hydrated.  Daughter wants pt to be seen today and informed there are no available appts today.  Informed Dr. Debara Pickett will be notified for further instructions.  Also informed pt may need to contact PCP for an appt today if evaluation required and RN will call her back.  Verbalized understanding.  Message forwarded to Dr. Debara Pickett.

## 2013-08-04 NOTE — ED Provider Notes (Signed)
CSN: [id number]     Arrival date & time 08/04/13  1312 History  This chart was scribed for Raeford Razor, MD by Ardelia Mems, ED Scribe. This patient was seen in room APA12/APA12 and the patient's care was started at 2:09 PM.   Chief Complaint  Patient presents with  . Weakness    The history is provided by the patient and a relative. No language interpreter was used.    HPI Comments: [name] [name] is a 68 y.o. male with a history of DM, HTN, stroke, RBBB, CAD and CHF who presents to the Emergency Department complaining of progressively worsening, waxing and waning, generalized weakness over the past 10 days. He reports associated mild fatigue, exercise intolerance,  nausea and lightheadedness. He also mentions that he has been having hypotension recently. ED blood pressure is 103/53. He states that he is compliant with his blood pressure medications, and that he has had no recent medication changes. When asked if he has been having any SOB, he indicates that he is "on the verge" of having SOB. He reports having a coronary artery bypass graft 2 years ago, and states that he has had a mild degree of SOB since then. He reports having a gradual, unexpected weight gain over the last 2 years, and states that he has had a reduced appetite and cold intolerance recently. He further reports that he has been having left foot numbness intermittently, that begins in his left heel and radiates to left hip. He reports taking 325 mg Aspirin daily, but states he takes no other blood thinners. He states that he only checks his blood sugar at home every couple weeks, and that his blood sugar is normally between 150 and 170, with some days reaching 200. He states that he takes his HTN and DM medications regularly, but that he did not take them today due to not feeling well enough. He denies fever, chills, cough, leg swelling, blood in stools or black stools or any other pain or symptoms.   Past Medical History   Diagnosis Date  . Diabetes mellitus   . Coronary artery disease   . High cholesterol   . Hypertension   . Stroke, Wallenberg's syndrome   . Right bundle branch block   . Ischemic cardiomyopathy   . Sleep apnea     sleep study 10/2010- AHI during total sleep 32.1/hr and during REM 62.3/hr (severe sleep apnea)  . CAD (coronary artery disease) 07/06/2011  . LV dysfunction 07/06/2011  . Stroke     Wallenberg   . CHF (congestive heart failure)   . Chronic kidney disease     enlarged prostate  . Arthritis     Knee L - probably  . 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 Wayland Salinas  . BPH (benign prostatic hyperplasia)   . History of nuclear stress test 09/2010    dipyridamole; mild perfusion defect due to attenuation with mild superimposed ischemia in apical septal, apical, apical inferior & apical lateral regions; rest LV enlarged in size; prominent gut uptake in infero-apical region; no significant ischemia demonstrated; low risk scan    Past Surgical History  Procedure Laterality Date  . Cardiac catheterization  01/2011    ischemic cardiomyopathy 30-35%, multivessel CAD (Dr. Bishop Limbo)   . Pilonidal cyst removed    . Colonscopy    . Coronary artery bypass graft  08/03/2011  Procedure: CORONARY ARTERY BYPASS GRAFTING (CABG);  Surgeon: [name], MD;  Location: [facility];  Service: Open Heart Surgery;  Laterality: N/A;  CABG times three using right saphenous vein and left mammary artery usisng endoscope.  . Transthoracic echocardiogram  11/10/2012    EF 40-45%, mild LVH, mild hypokinesis of anteroseptal myocardium, grade 1 diastolic dysfunction; mild MR & calcifed mitral annulus; LA mild-mod dilated; RA mildly dilated  . Lower extremity arterial doppler  03/16/2013    bilat ABIs demonstated normal values; R runoff - posterior tibial & anterior tibial arteries occluded; L runoff - peroneal & posterior tibial  arteries occluded, anterior tibial artery appears occluded   Family History  Problem Relation Age of Onset  . Anesthesia problems Daughter   . Cancer Mother   . Lung disease Father     & heart disease  . Colon cancer Sister   . Sudden death Maternal Grandmother    History  Substance Use Topics  . Smoking status: Former Smoker -- 5 years    Types: Cigars  . Smokeless tobacco: Former Systems developer    Quit date: 02/27/2010     Comment: while playing golf  . Alcohol Use: Yes     Comment: occasional    Review of Systems  Constitutional: Positive for appetite change (reduced), fatigue and unexpected weight change (weight gain over the past 2 years). Negative for fever and chills.       +Exercise intolerance  Respiratory: Positive for shortness of breath. Negative for cough.   Cardiovascular: Negative for leg swelling.  Gastrointestinal: Positive for nausea. Negative for blood in stool.  Endocrine: Positive for cold intolerance.  Neurological: Positive for weakness and light-headedness.  All other systems reviewed and are negative.   Allergies  Review of patient's allergies indicates no known allergies.  Home Medications   Current Outpatient Rx  Name  Route  Sig  Dispense  Refill  . aspirin 325 MG tablet   Oral   Take 325 mg by mouth daily.         . carvedilol (COREG) 6.25 MG tablet   Oral   Take 6.25 mg by mouth 2 (two) times daily with a meal.         . Cholecalciferol (VITAMIN D3) 5000 UNITS CAPS   Oral   Take 1 capsule by mouth daily.         Marland Kitchen Corn Dextrin (EQL FIBER SUPPLEMENT PO)   Oral   Take by mouth at bedtime.         Marland Kitchen glipiZIDE (GLUCOTROL XL) 10 MG 24 hr tablet   Oral   Take 10 mg by mouth every morning.          . linagliptin (TRADJENTA) 5 MG TABS tablet   Oral   Take 5 mg by mouth daily.         . metFORMIN (GLUMETZA) 500 MG (MOD) 24 hr tablet   Oral   Take 500 mg by mouth 2 (two) times daily.          . ramipril (ALTACE) 5 MG  capsule   Oral   Take 5 mg by mouth daily.          . rosuvastatin (CRESTOR) 40 MG tablet   Oral   Take 20 mg by mouth at bedtime.          Marland Kitchen spironolactone (ALDACTONE) 25 MG tablet   Oral   Take 0.5 tablets (12.5 mg total) by mouth daily.   15 tablet  10    Triage Vitals: BP 103/53  Pulse 76  Temp(Src) 98.3 F (36.8 C) (Oral)  Resp 16  Ht 6\' 2"  (1.88 m)  Wt 250 lb (113.399 kg)  BMI 32.08 kg/m2  SpO2 98%  Physical Exam  Nursing note and vitals reviewed. Constitutional: He is oriented to person, place, and time. He appears well-developed and well-nourished.  HENT:  Head: Normocephalic and atraumatic.  Eyes: Conjunctivae and EOM are normal.  Conjunctiva appear normal, not pale  Neck: Normal range of motion.  Cardiovascular: Normal rate, regular rhythm, normal heart sounds and intact distal pulses.   Pulmonary/Chest: Effort normal and breath sounds normal. No respiratory distress.  Abdominal: Soft. He exhibits no distension. There is no tenderness.  Musculoskeletal: Normal range of motion. He exhibits no edema.  No lower extremity edema.  Neurological: He is alert and oriented to person, place, and time.  Skin: Skin is warm and dry.  Psychiatric: He has a normal mood and affect. Judgment normal.    ED Course  Procedures (including critical care time)  DIAGNOSTIC STUDIES: Oxygen Saturation is 98% on RA, normal by my interpretation.    COORDINATION OF CARE: 2:10 PM- Discussed plan to obtain diagnostic lab work, in order to determine the cause of pt's symptoms. Pt advised of plan for treatment and pt agrees.  Labs Review Labs Reviewed  CBC WITH DIFFERENTIAL - Abnormal; Notable for the following:    WBC 3.1 (*)    RBC 4.20 (*)    Hemoglobin 12.5 (*)    HCT 35.3 (*)    Monocytes Relative 13 (*)    All other components within normal limits  BASIC METABOLIC PANEL - Abnormal; Notable for the following:    Sodium 136 (*)    Glucose, Bld 280 (*)    All other  components within normal limits  GLUCOSE, CAPILLARY - Abnormal; Notable for the following:    Glucose-Capillary 266 (*)    All other components within normal limits  URINALYSIS, ROUTINE W REFLEX MICROSCOPIC - Abnormal; Notable for the following:    Glucose, UA >1000 (*)    Ketones, ur 15 (*)    All other components within normal limits  PROTIME-INR  APTT  TROPONIN I  URINE MICROSCOPIC-ADD ON  TSH   Imaging Review No results found.  EKG Interpretation    Date/Time:  Friday August 04 2013 16:25:05 EST Ventricular Rate:  75 PR Interval:  224 QRS Duration: 138 QT Interval:  434 QTC Calculation: 484 R Axis:   -45 Text Interpretation:  Sinus rhythm with 1st degree A-V block Right bundle branch block Left anterior fascicular block  Bifascicular block Abnormal ECG When compared with ECG of 04-Aug-2013 13:56, No significant change was found ED PHYSICIAN INTERPRETATION AVAILABLE IN [facility] Confirmed by TEST, RECORD ([id number]) on 08/06/2013 11:11:16 AM            MDM   1. Generalized weakness     67yM with generalized fatigue. Known coronary artery disease. S/p multivessel CABG in 2013 for an ischemic cardiomyopathy. EF was 20-25% prior to surgery however post surgery his EF had improved up to 45-50%. Recently his EF was 40-45% by echo in May of 2014. The symptoms he is presenting with today appear similar to symptoms he was having prior to his CABG. EKG is fairly similar to one from two months ago. Troponin normal.  Likely dyspnea ("on the verge of it.") Seems to downplay symptoms. Not clearly exertional though. He denies any CP currently or since  symptoms onset.  Hyperglycemia likely from not taking his DM meds recently because of the way he is feeling. Instructed to resume. Hypotensive recently and in triage. Normalized w/o intervention, but subsequently given 1 liter bolus. Renal function normal. Did not take Coreg today. Will have him continue to hold this until can  follow-up with cards. No past hx of thyroid dynfunction but with generalized fatigue, weight gain and constipation TSH sent to aid follow-up provider(s). Mild leukopenia with normal ANC. Afebrile. Consider infectious etiology, but doubt.   I do not feel strongly at this point that he requires admission to the hospital. I think he is in need of close cardiology follow-up though and possible stress at their discretion. Will discuss with Dr Debara Pickett or on-call provider.       Virgel Manifold, MD 08/06/13 [phone]

## 2013-08-04 NOTE — ED Notes (Signed)
Patient reports generalized weakness that started 10 days ago. Per patient stayed home and rested, felt better. Patient reports feeling very fatigue this morning. Patient has hx of cardiac issues, stroke, of hyper/hypotension. Denies any fevers, nausea, vomiting, or diarrhea. Patient does reports some slight dizziness.

## 2013-08-05 LAB — TSH: TSH: 1.386 u[IU]/mL (ref 0.350–4.500)

## 2013-08-07 ENCOUNTER — Telehealth: Payer: Self-pay | Admitting: *Deleted

## 2013-08-07 NOTE — Telephone Encounter (Signed)
See if [name] [name] can come in at 4:15 on Wednesday - it is ok to unblock that time.  -Dr. Debara Pickett

## 2013-08-07 NOTE — Telephone Encounter (Signed)
Message forwarded to Dr. Josepha Pigg, RN.  Please send response to [place].  Thanks. ~ [employer]

## 2013-08-07 NOTE — Telephone Encounter (Signed)
Pt's daughter stated that her dad was in the ER at [facility] on Friday and the ER doctor from what [name] stated he spoke to Dr. Debara Pickett and was supposed to get an appointment with him this week. He is completely booked and they do not want to see a PA. Can you ask doctor [name] about this and let me know what to do. Thank you.

## 2013-08-07 NOTE — Telephone Encounter (Signed)
Patient scheduled.

## 2013-08-09 ENCOUNTER — Ambulatory Visit (INDEPENDENT_AMBULATORY_CARE_PROVIDER_SITE_OTHER): Payer: Medicare Other | Admitting: Internal Medicine

## 2013-08-09 ENCOUNTER — Encounter: Payer: Self-pay | Admitting: Internal Medicine

## 2013-08-09 VITALS — BP 110/60 | HR 82 | Ht 74.0 in | Wt 247.0 lb

## 2013-08-09 DIAGNOSIS — Z951 Presence of aortocoronary bypass graft: Secondary | ICD-10-CM

## 2013-08-09 DIAGNOSIS — I2510 Atherosclerotic heart disease of native coronary artery without angina pectoris: Secondary | ICD-10-CM

## 2013-08-09 DIAGNOSIS — R079 Chest pain, unspecified: Secondary | ICD-10-CM

## 2013-08-09 DIAGNOSIS — I2589 Other forms of chronic ischemic heart disease: Secondary | ICD-10-CM | POA: Diagnosis not present

## 2013-08-09 DIAGNOSIS — I959 Hypotension, unspecified: Secondary | ICD-10-CM

## 2013-08-09 DIAGNOSIS — I10 Essential (primary) hypertension: Secondary | ICD-10-CM

## 2013-08-09 DIAGNOSIS — I255 Ischemic cardiomyopathy: Secondary | ICD-10-CM

## 2013-08-09 NOTE — Patient Instructions (Signed)
Your physician has requested that you have a lexiscan myoview. For further information please visit [email or web address]. Please follow instruction sheet, as given.  Your physician recommends that you schedule a follow-up appointment after your test.  

## 2013-08-09 NOTE — Progress Notes (Signed)
OFFICE NOTE  Chief Complaint:  Anxiety, mild dyspnea  Primary Care Physician: [name] [name], MD  HPI:  [name] [name] is a pleasant 68 year old gentleman previously followed by Dr. Rollene Fare. His past medical history is significant for coronary artery disease. In 2013 he underwent multivessel CABG for an ischemic cardiomyopathy. EF was 20-25% prior to surgery however post surgery his EF had improved up to 45-50%. Recently his EF was 40-45% by echo in May of 2014. There was mild mitral regurgitation, mild to moderately dilated left atrium and mild LVH. [name] [name] has been describing some anxiety. He also reports some pain in his legs however underwent Dopplers in September 2014 which showed preserved ABIs bilaterally a 1.0 on the right and 1.1 on the left. The bilateral peroneal and posterior tibial arteries were occluded. He reports some optimal control of his diabetes. His A1c was 7.2. He is concerned about the cost of taking Tradjenta. He is followed by Dr. Elyse Hsu.   He was recently seen in the emergency room and [facility] hospital. There he presented with hypotension and was given 1 L normal saline and his symptoms improved. He had been having dizziness as well as some upper back pain into the left shoulder blade with exertion recently. The symptoms are worse particularly when climbing up ladders or going up stairs and are relieved at rest. After that hospitalization it was recommended that he discontinue his ACE inhibitor and beta blocker, and his blood pressure appears improved today up to 110/60.  PMHx:  Past Medical History  Diagnosis Date  . Diabetes mellitus   . Coronary artery disease   . High cholesterol   . Hypertension   . Stroke, Wallenberg's syndrome   . Right bundle branch block   . Ischemic cardiomyopathy   . Sleep apnea     sleep study 10/2010- AHI during total sleep 32.1/hr and during REM 62.3/hr (severe sleep apnea)  . CAD (coronary artery disease) 07/06/2011  . LV  dysfunction 07/06/2011  . Stroke     Wallenberg   . CHF (congestive heart failure)   . Chronic kidney disease     enlarged prostate  . Arthritis     Knee L - probably  . 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 Ellison Hughs  . BPH (benign prostatic hyperplasia)   . History of nuclear stress test 09/2010    dipyridamole; mild perfusion defect due to attenuation with mild superimposed ischemia in apical septal, apical, apical inferior & apical lateral regions; rest LV enlarged in size; prominent gut uptake in infero-apical region; no significant ischemia demonstrated; low risk scan     Past Surgical History  Procedure Laterality Date  . Cardiac catheterization  01/2011    ischemic cardiomyopathy 30-35%, multivessel CAD (Dr. [name])   . Pilonidal cyst removed    . Colonscopy    . Coronary artery bypass graft  08/03/2011    Procedure: CORONARY ARTERY BYPASS GRAFTING (CABG);  Surgeon: [name], MD;  Location: [facility];  Service: Open Heart Surgery;  Laterality: N/A;  CABG times three using right saphenous vein and left mammary artery usisng endoscope.  . Transthoracic echocardiogram  11/10/2012    EF 40-45%, mild LVH, mild hypokinesis of anteroseptal myocardium, grade 1 diastolic dysfunction; mild MR & calcifed mitral annulus; LA mild-mod dilated; RA mildly dilated  . Lower extremity arterial doppler  03/16/2013  bilat ABIs demonstated normal values; R runoff - posterior tibial & anterior tibial arteries occluded; L runoff - peroneal & posterior tibial arteries occluded, anterior tibial artery appears occluded    FAMHx:  Family History  Problem Relation Age of Onset  . Anesthesia problems Daughter   . Cancer Mother   . Lung disease Father     & heart disease  . Colon cancer Sister   . Sudden death Maternal Grandmother     SOCHx:   reports that he has quit smoking. His smoking use included Cigars. He quit  smokeless tobacco use about 3 years ago. He reports that he drinks alcohol. He reports that he does not use illicit drugs.  ALLERGIES:  No Known Allergies  ROS: A comprehensive review of systems was negative except for: Constitutional: positive for fatigue Respiratory: positive for dyspnea on exertion Cardiovascular: positive for chest pain Behavioral/Psych: positive for anxiety  HOME MEDS: Current Outpatient Prescriptions  Medication Sig Dispense Refill  . aspirin 325 MG tablet Take 325 mg by mouth daily.      . Canagliflozin (INVOKANA) 300 MG TABS Take 150 mg by mouth daily.      . Cholecalciferol (VITAMIN D3) 5000 UNITS CAPS Take 1 capsule by mouth daily.      Marland Kitchen Corn Dextrin (EQL FIBER SUPPLEMENT PO) Take by mouth at bedtime.      Marland Kitchen glipiZIDE (GLUCOTROL XL) 10 MG 24 hr tablet Take 10 mg by mouth every morning.       . metFORMIN (GLUMETZA) 500 MG (MOD) 24 hr tablet Take 500 mg by mouth 2 (two) times daily.       . rosuvastatin (CRESTOR) 40 MG tablet Take 20 mg by mouth daily.       Marland Kitchen spironolactone (ALDACTONE) 25 MG tablet Take 0.5 tablets (12.5 mg total) by mouth daily.  15 tablet  10  . carvedilol (COREG) 6.25 MG tablet Take 6.25 mg by mouth 2 (two) times daily with a meal.      . ramipril (ALTACE) 5 MG capsule Take 5 mg by mouth daily.        No current facility-administered medications for this visit.    LABS/IMAGING: No results found for this or any previous visit (from the past 48 hour(s)). No results found.  VITALS: BP 110/60  Pulse 82  Ht 6\' 2"  (1.88 m)  Wt 247 lb (112.038 kg)  BMI 31.70 kg/m2  EXAM: General appearance: alert and no distress Neck: no carotid bruit and no JVD Lungs: clear to auscultation bilaterally Heart: regular rate and rhythm, S1, S2 normal, no murmur, click, rub or gallop Abdomen: soft, non-tender; bowel sounds normal; no masses,  no organomegaly Extremities: extremities normal, atraumatic, no cyanosis or edema Pulses: 2+ and  symmetric Skin: Skin color, texture, turgor normal. No rashes or lesions Neurologic: Grossly normal Psych: Mood, affect normal  EKG: Normal sinus rhythm at 82, bifascicular block   ASSESSMENT: 1. Coronary artery disease status post three-vessel CABG in 2013 (LIMA to LAD, SVG to PDA and SVG to PLA) 2. Hypertension-controlled 3. Dyslipidemia 4. Diabetes type 2 5. Mild PVD-with normal ABI 6. Hypotension/upper back pain - similar to pain he had in the past, may be an anginal equivalent  PLAN: 1.   [name] [name] had an episode of unexplained hypotension and has been having fatigue, lethargy and pain across his upper back with exertion and improves with rest. This is a symptom pattern that was similar to prior to his bypass surgery. I'm concerned about  new angina. His last stress test and cardiac catheterization was in 2012 at which time medical therapy was recommended.  I would recommend a repeat nuclear stress test to look for any possible new ischemia and treat accordingly. I would continue to hold his ACE inhibitor and beta blocker as his blood pressure is normal today.  Finally he is inquiring about switching to generic statin as he was informed that his Crestor was going to be fairly expensive and in your future. He has not tried or failed to statin in the past therefore I may elect to put him on atorvastatin. We will discuss this is followup visit in a few weeks.  [name], MD, [facility] Attending Cardiologist [facility]  Ahmani Prehn C 08/09/2013, 5:20 PM

## 2013-08-22 ENCOUNTER — Encounter (HOSPITAL_COMMUNITY): Payer: Medicare Other

## 2013-08-24 ENCOUNTER — Encounter (HOSPITAL_COMMUNITY): Payer: Medicare Other

## 2013-09-05 ENCOUNTER — Ambulatory Visit (HOSPITAL_COMMUNITY)
Admission: RE | Admit: 2013-09-05 | Discharge: 2013-09-05 | Disposition: A | Discharge status: Home / Self Care | Payer: Medicare Other | Source: Ambulatory Visit | Attending: Cardiovascular Disease | Admitting: Cardiovascular Disease

## 2013-09-05 DIAGNOSIS — I959 Hypotension, unspecified: Secondary | ICD-10-CM | POA: Diagnosis not present

## 2013-09-05 DIAGNOSIS — R079 Chest pain, unspecified: Secondary | ICD-10-CM | POA: Insufficient documentation

## 2013-09-05 MED ORDER — TECHNETIUM TC 99M SESTAMIBI GENERIC - CARDIOLITE
10.0000 | Freq: Once | INTRAVENOUS | Status: AC | PRN
  Administered 2013-09-05: 10 via INTRAVENOUS

## 2013-09-05 MED ORDER — REGADENOSON 0.4 MG/5ML IV SOLN
0.4000 mg | Freq: Once | INTRAVENOUS | Status: AC
  Administered 2013-09-05: 0.4 mg via INTRAVENOUS

## 2013-09-05 MED ORDER — TECHNETIUM TC 99M SESTAMIBI GENERIC - CARDIOLITE
30.0000 | Freq: Once | INTRAVENOUS | Status: AC | PRN
  Administered 2013-09-05: 30 via INTRAVENOUS

## 2013-09-05 NOTE — Procedures (Addendum)
[facility] [address] [place] [address] [phone]  Cardiology Nuclear Med Study  [name] [name] is a 68 y.o. male     MRN : [id number]     DOB: 28-May-1946  Procedure Date: 09/05/2013  Nuclear Med Background Indication for Stress Test:  [facility] History:  CAD;CABG X3-08/02/2012;ICM Cardiac Risk Factors: CVA, Family History - CAD, History of Smoking, Hypertension, Lipids, NIDDM, Overweight, PVD, RBBB and vasovagal syncope  Symptoms:  Chest Pain, Dizziness, DOE, Fatigue and Light-Headedness   Nuclear Pre-Procedure Caffeine/Decaff Intake:  7:00pm NPO After: 5:00am   IV Site: R Hand  IV 0.9% NS with Angio Cath:  22g  Chest Size (in):  44"  IV Started by: Azucena Cecil, RN  Height: 6\' 2"  (1.88 m)  Cup Size: n/a  BMI:  Body mass index is 31.7 kg/(m^2). Weight:  247 lb (112.038 kg)   Tech Comments:  n/a    Nuclear Med Study 1 or 2 day study: 1 day  Stress Test Type:  [place] Provider:  Lyman Bishop, MD   Resting Radionuclide: Technetium 32m Sestamibi  Resting Radionuclide Dose: 10.1 mCi   Stress Radionuclide:  Technetium 58m Sestamibi  Stress Radionuclide Dose: 29.4 mCi           Stress Protocol Rest HR: 72 Stress HR:85  Rest BP: 153/80 Stress BP: 153/80  Exercise Time (min): n/a METS: n/a          Dose of Adenosine (mg):  n/a Dose of Lexiscan: 0.4 mg  Dose of Atropine (mg): n/a Dose of Dobutamine: n/a mcg/kg/min (at max HR)  Stress Test Technologist: Mellody Memos, CCT Nuclear Technologist: Otho Perl, CNMT   Rest Procedure:  Myocardial perfusion imaging was performed at rest 45 minutes following the intravenous administration of Technetium 19m Sestamibi. Stress Procedure:  The patient received IV Lexiscan 0.4 mg over 15-seconds.  Technetium 57m Sestamibi injected at 30-seconds.  There were no significant changes with Lexiscan.  Quantitative spect images were  obtained after a 45 minute delay.  Transient Ischemic Dilatation (Normal <1.22):  1.22 Lung/Heart Ratio (Normal <0.45):  0.38 QGS EDV:  123 ml QGS ESV:  69 ml LV Ejection Fraction: 44%  Signed by Joellyn Quails, Rad Tech on 09/05/2013 at 9:29 AM.      Rest ECG: NSR-RBBB  Stress ECG: No significant change from baseline ECG  QPS Raw Data Images:  Normal; no motion artifact; normal heart/lung ratio. Stress Images:  There is decreased uptake in the inferior wall. Rest Images:  There is decreased uptake in the inferior wall. Subtraction (SDS):  There is a fixed inferior defect that is most consistent with diaphragmatic attenuation.  Impression Exercise Capacity:  Lexiscan with no exercise. BP Response:  Normal blood pressure response. Clinical Symptoms:  No significant symptoms noted. ECG Impression:  No significant ST segment change suggestive of ischemia. Comparison with Prior Nuclear Study: No significant change from previous study  Overall Impression:  Low risk stress nuclear study Diaphragmatic attenuation vs non transmural inferior wall scar.  LV Wall Motion:  Inferior hypokinesis   Lorretta Harp, MD  09/05/2013 11:05 AM

## 2013-09-15 ENCOUNTER — Encounter: Payer: Self-pay | Admitting: Internal Medicine

## 2013-09-15 ENCOUNTER — Ambulatory Visit (INDEPENDENT_AMBULATORY_CARE_PROVIDER_SITE_OTHER): Payer: Medicare Other | Admitting: Internal Medicine

## 2013-09-15 ENCOUNTER — Ambulatory Visit
Admission: RE | Admit: 2013-09-15 | Discharge: 2013-09-15 | Disposition: A | Discharge status: Home / Self Care | Payer: Medicare Other | Source: Ambulatory Visit | Attending: Internal Medicine | Admitting: Internal Medicine

## 2013-09-15 VITALS — BP 108/72 | HR 72 | Ht 74.0 in | Wt 242.9 lb

## 2013-09-15 DIAGNOSIS — I255 Ischemic cardiomyopathy: Secondary | ICD-10-CM

## 2013-09-15 DIAGNOSIS — I2510 Atherosclerotic heart disease of native coronary artery without angina pectoris: Secondary | ICD-10-CM

## 2013-09-15 DIAGNOSIS — R5381 Other malaise: Secondary | ICD-10-CM | POA: Diagnosis not present

## 2013-09-15 DIAGNOSIS — I2589 Other forms of chronic ischemic heart disease: Secondary | ICD-10-CM

## 2013-09-15 DIAGNOSIS — Z01811 Encounter for preprocedural respiratory examination: Secondary | ICD-10-CM | POA: Diagnosis not present

## 2013-09-15 DIAGNOSIS — R5383 Other fatigue: Secondary | ICD-10-CM

## 2013-09-15 DIAGNOSIS — I959 Hypotension, unspecified: Secondary | ICD-10-CM

## 2013-09-15 DIAGNOSIS — Z951 Presence of aortocoronary bypass graft: Secondary | ICD-10-CM | POA: Diagnosis not present

## 2013-09-15 DIAGNOSIS — Z01818 Encounter for other preprocedural examination: Secondary | ICD-10-CM | POA: Diagnosis not present

## 2013-09-15 DIAGNOSIS — R079 Chest pain, unspecified: Secondary | ICD-10-CM | POA: Diagnosis not present

## 2013-09-15 DIAGNOSIS — D689 Coagulation defect, unspecified: Secondary | ICD-10-CM

## 2013-09-15 DIAGNOSIS — E119 Type 2 diabetes mellitus without complications: Secondary | ICD-10-CM

## 2013-09-15 LAB — CBC
HCT: 41.5 % (ref 39.0–52.0)
Hemoglobin: 14 g/dL (ref 13.0–17.0)
MCH: 29.2 pg (ref 26.0–34.0)
MCHC: 33.7 g/dL (ref 30.0–36.0)
MCV: 86.6 fL (ref 78.0–100.0)
Platelets: 235 10*3/uL (ref 150–400)
RBC: 4.79 MIL/uL (ref 4.22–5.81)
RDW: 14.6 % (ref 11.5–15.5)
WBC: 5.1 10*3/uL (ref 4.0–10.5)

## 2013-09-15 LAB — COMPREHENSIVE METABOLIC PANEL
ALT: 19 U/L (ref 0–53)
AST: 18 U/L (ref 0–37)
Albumin: 4.2 g/dL (ref 3.5–5.2)
Alkaline Phosphatase: 69 U/L (ref 39–117)
BUN: 25 mg/dL — ABNORMAL HIGH (ref 6–23)
CALCIUM: 9.2 mg/dL (ref 8.4–10.5)
CHLORIDE: 103 meq/L (ref 96–112)
CO2: 21 meq/L (ref 19–32)
CREATININE: 0.98 mg/dL (ref 0.50–1.35)
Glucose, Bld: 259 mg/dL — ABNORMAL HIGH (ref 70–99)
Potassium: 4.6 mEq/L (ref 3.5–5.3)
Sodium: 137 mEq/L (ref 135–145)
Total Bilirubin: 0.6 mg/dL (ref 0.2–1.2)
Total Protein: 7.3 g/dL (ref 6.0–8.3)

## 2013-09-15 NOTE — Progress Notes (Signed)
OFFICE NOTE  Chief Complaint:  Anxiety, mild dyspnea  Primary Care Physician: [name] [name], MD  HPI:  [name] [name] is a pleasant 68 year old gentleman previously followed by Dr. Rollene Fare. His past medical history is significant for coronary artery disease. In 2013 he underwent multivessel CABG for an ischemic cardiomyopathy. EF was 20-25% prior to surgery however post surgery his EF had improved up to 45-50%. Recently his EF was 40-45% by echo in May of 2014. There was mild mitral regurgitation, mild to moderately dilated left atrium and mild LVH. [name] has been describing some anxiety. He also reports some pain in his legs however underwent Dopplers in September 2014 which showed preserved ABIs bilaterally a 1.0 on the right and 1.1 on the left. The bilateral peroneal and posterior tibial arteries were occluded. He reports some optimal control of his diabetes. His A1c was 7.2. He is concerned about the cost of taking Tradjenta. He is followed by Dr. Elyse Hsu.   He was recently seen in the emergency [facility]. There he presented with hypotension and was given 1 L normal saline and his symptoms improved. He had been having dizziness as well as some upper back pain into the left shoulder blade with exertion recently. The symptoms are worse particularly when climbing up ladders or going up stairs and are relieved at rest. After that hospitalization it was recommended that he discontinue his ACE inhibitor and beta blocker, and his blood pressure appears improved today up to 110/60.  Mr. Sellman returns today for followup of his stress test. This is interpreted as nonischemic with an EF of 44%. He reports still feeling very fatigued and extremely short of breath when doing activities. He says that the other day he tried to hit golf balls and felt that he was totally exhausted after just an hour. He reports his blood pressure has improved somewhat off of all medications  however remains low. He has had symptoms of orthostatic hypotension in the past. His symptoms do feel similar to prior to his bypass surgery. He also feels that he was much better after surgery than he is now and that his symptoms have been going on for the past 2 months.  PMHx:  Past Medical History  Diagnosis Date  . Diabetes mellitus   . Coronary artery disease   . High cholesterol   . Hypertension   . Stroke, Wallenberg's syndrome   . Right bundle branch block   . Ischemic cardiomyopathy   . Sleep apnea     sleep study 10/2010- AHI during total sleep 32.1/hr and during REM 62.3/hr (severe sleep apnea)  . CAD (coronary artery disease) 07/06/2011  . LV dysfunction 07/06/2011  . Stroke     Wallenberg   . CHF (congestive heart failure)   . Chronic kidney disease     enlarged prostate  . Arthritis     Knee L - probably  . 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 Ellison Hughs  . BPH (benign prostatic hyperplasia)   . History of nuclear stress test 09/2010    dipyridamole; mild perfusion defect due to attenuation with mild superimposed ischemia in apical septal, apical, apical inferior & apical lateral regions; rest LV enlarged in size; prominent gut uptake in infero-apical region; no significant ischemia demonstrated; low risk scan     Past Surgical History  Procedure Laterality Date  .  Cardiac catheterization  01/2011    ischemic cardiomyopathy 30-35%, multivessel CAD (Dr. [name])   . Pilonidal cyst removed    . Colonscopy    . Coronary artery bypass graft  08/03/2011    Procedure: CORONARY ARTERY BYPASS GRAFTING (CABG);  Surgeon: [name], MD;  Location: [place];  Service: Open Heart Surgery;  Laterality: N/A;  CABG times three using right saphenous vein and left mammary artery usisng endoscope.  . Transthoracic echocardiogram  11/10/2012    EF 40-45%, mild LVH, mild hypokinesis of anteroseptal  myocardium, grade 1 diastolic dysfunction; mild MR & calcifed mitral annulus; LA mild-mod dilated; RA mildly dilated  . Lower extremity arterial doppler  03/16/2013    bilat ABIs demonstated normal values; R runoff - posterior tibial & anterior tibial arteries occluded; L runoff - peroneal & posterior tibial arteries occluded, anterior tibial artery appears occluded    FAMHx:  Family History  Problem Relation Age of Onset  . Anesthesia problems Daughter   . Cancer Mother   . Lung disease Father     & heart disease  . Colon cancer Sister   . Sudden death Maternal Grandmother     SOCHx:   reports that he has quit smoking. His smoking use included Cigars. He quit smokeless tobacco use about 3 years ago. He reports that he drinks alcohol. He reports that he does not use illicit drugs.  ALLERGIES:  No Known Allergies  ROS: A comprehensive review of systems was negative except for: Constitutional: positive for fatigue Respiratory: positive for dyspnea on exertion Cardiovascular: positive for chest pain Behavioral/Psych: positive for anxiety  HOME MEDS: Current Outpatient Prescriptions  Medication Sig Dispense Refill  . aspirin 325 MG tablet Take 325 mg by mouth daily.      . Canagliflozin (INVOKANA) 300 MG TABS Take 150 mg by mouth daily.      . Cholecalciferol (VITAMIN D3) 5000 UNITS CAPS Take 1 capsule by mouth daily.      Marland Kitchen Corn Dextrin (EQL FIBER SUPPLEMENT PO) Take by mouth at bedtime.      Marland Kitchen glipiZIDE (GLUCOTROL XL) 10 MG 24 hr tablet Take 10 mg by mouth every morning.       . metFORMIN (GLUMETZA) 500 MG (MOD) 24 hr tablet Take 500 mg by mouth 2 (two) times daily.       . rosuvastatin (CRESTOR) 40 MG tablet Take 20 mg by mouth daily.       Marland Kitchen spironolactone (ALDACTONE) 25 MG tablet Take 0.5 tablets (12.5 mg total) by mouth daily.  15 tablet  10   No current facility-administered medications for this visit.    LABS/IMAGING: No results found for this or any previous visit  (from the past 48 hour(s)). No results found.  VITALS: BP 108/72  Pulse 72  Ht 6\' 2"  (1.88 m)  Wt 242 lb 14.4 oz (110.179 kg)  BMI 31.17 kg/m2  EXAM: deferred  EKG: deferred  ASSESSMENT: 1. Coronary artery disease status post three-vessel CABG in 2013 (LIMA to LAD, SVG to PDA and SVG to PLA) 2. Hypertension-controlled 3. Dyslipidemia 4. Diabetes type 2 5. Mild PVD-with normal ABI 6. Hypotension/upper back pain - similar to pain he had in the past, may be an anginal equivalent  PLAN: 1.   Mr. Seymore to low risk nuclear stress test with EF of 44% which appears preserved. Despite this, his symptoms continue and he is short of breath. He gets very fatigued and has had problems with low blood pressure.  He is now off of all blood pressure medications. I am concerned that this could represent worsening angina or perhaps graft dysfunction. The only way to know if he has new ischemia is to repeat cardiac catheterization. I discussed the risks and benefits of this with him and his daughter in the office today and he is agreeable. We will plan that in the near future. If his catheterization is nonobstructive, then his symptoms may be simply due to hypotension from an unknown mechanism. He is known to be orthostatic in the past and may benefit from medication such as fludrocortisone. It might be worthwhile checking a random cortisol level.  [name], MD, [facility] Attending Cardiologist [facility]  [name],Kenneth C 09/15/2013, 9:46 AM

## 2013-09-15 NOTE — Patient Instructions (Signed)
Your physician has requested that you have a cardiac catheterization with Dr. [name]. Cardiac catheterization is used to diagnose and/or treat various heart conditions. Doctors may recommend this procedure for a number of different reasons. The most common reason is to evaluate chest pain. Chest pain can be a symptom of coronary artery disease (CAD), and cardiac catheterization can show whether plaque is narrowing or blocking your heart's arteries. This procedure is also used to evaluate the valves, as well as measure the blood flow and oxygen levels in different parts of your heart. For further information please visit www.[email or web address]. Please follow instruction sheet, as given.  You will need to have blood work & a chest x-ray 3-5 days prior to this procedure.  Please go to [address] [facility] You do not need an appointment  

## 2013-09-16 LAB — PROTIME-INR
INR: 1.02 (ref <1.50)
Prothrombin Time: 13.3 seconds (ref 11.6–15.2)

## 2013-09-16 LAB — APTT: APTT: 34 s (ref 24–37)

## 2013-09-16 LAB — TSH: TSH: 1.494 u[IU]/mL (ref 0.350–4.500)

## 2013-09-18 ENCOUNTER — Encounter (HOSPITAL_COMMUNITY): Payer: Self-pay | Admitting: Pharmacy Technician

## 2013-09-18 DIAGNOSIS — K5900 Constipation, unspecified: Secondary | ICD-10-CM | POA: Diagnosis not present

## 2013-09-18 DIAGNOSIS — K625 Hemorrhage of anus and rectum: Secondary | ICD-10-CM | POA: Diagnosis not present

## 2013-09-20 ENCOUNTER — Encounter (HOSPITAL_COMMUNITY)
Admission: RE | Disposition: A | Discharge status: Home / Self Care | Payer: Self-pay | Source: Ambulatory Visit | Attending: Internal Medicine

## 2013-09-20 ENCOUNTER — Ambulatory Visit (HOSPITAL_COMMUNITY)
Admission: RE | Admit: 2013-09-20 | Discharge: 2013-09-20 | Disposition: A | Discharge status: Home / Self Care | Payer: Medicare Other | Source: Ambulatory Visit | Attending: Internal Medicine | Admitting: Internal Medicine

## 2013-09-20 DIAGNOSIS — Z01818 Encounter for other preprocedural examination: Secondary | ICD-10-CM

## 2013-09-20 DIAGNOSIS — E119 Type 2 diabetes mellitus without complications: Secondary | ICD-10-CM | POA: Diagnosis not present

## 2013-09-20 DIAGNOSIS — I4949 Other premature depolarization: Secondary | ICD-10-CM | POA: Insufficient documentation

## 2013-09-20 DIAGNOSIS — I517 Cardiomegaly: Secondary | ICD-10-CM | POA: Diagnosis not present

## 2013-09-20 DIAGNOSIS — I200 Unstable angina: Secondary | ICD-10-CM | POA: Diagnosis not present

## 2013-09-20 DIAGNOSIS — I2510 Atherosclerotic heart disease of native coronary artery without angina pectoris: Secondary | ICD-10-CM

## 2013-09-20 DIAGNOSIS — R55 Syncope and collapse: Secondary | ICD-10-CM

## 2013-09-20 DIAGNOSIS — F411 Generalized anxiety disorder: Secondary | ICD-10-CM | POA: Insufficient documentation

## 2013-09-20 DIAGNOSIS — I951 Orthostatic hypotension: Secondary | ICD-10-CM | POA: Insufficient documentation

## 2013-09-20 DIAGNOSIS — I70209 Unspecified atherosclerosis of native arteries of extremities, unspecified extremity: Secondary | ICD-10-CM | POA: Insufficient documentation

## 2013-09-20 DIAGNOSIS — I059 Rheumatic mitral valve disease, unspecified: Secondary | ICD-10-CM | POA: Diagnosis not present

## 2013-09-20 DIAGNOSIS — Z951 Presence of aortocoronary bypass graft: Secondary | ICD-10-CM | POA: Insufficient documentation

## 2013-09-20 DIAGNOSIS — I959 Hypotension, unspecified: Secondary | ICD-10-CM

## 2013-09-20 DIAGNOSIS — R079 Chest pain, unspecified: Secondary | ICD-10-CM

## 2013-09-20 HISTORY — PX: LEFT HEART CATHETERIZATION WITH CORONARY/GRAFT ANGIOGRAM: SHX5450

## 2013-09-20 LAB — GLUCOSE, CAPILLARY
GLUCOSE-CAPILLARY: 239 mg/dL — AB (ref 70–99)
Glucose-Capillary: 283 mg/dL — ABNORMAL HIGH (ref 70–99)

## 2013-09-20 SURGERY — LEFT HEART CATHETERIZATION WITH CORONARY/GRAFT ANGIOGRAM
Anesthesia: LOCAL

## 2013-09-20 MED ORDER — SODIUM CHLORIDE 0.9 % IJ SOLN: 3.0000 mL | INTRAMUSCULAR | Status: DC | PRN

## 2013-09-20 MED ORDER — LIDOCAINE HCL (PF) 1 % IJ SOLN
INTRAMUSCULAR | Status: AC
  Filled 2013-09-20: qty 30

## 2013-09-20 MED ORDER — SODIUM CHLORIDE 0.9 % IV SOLN
INTRAVENOUS | Status: DC
  Administered 2013-09-20: 07:00:00 via INTRAVENOUS

## 2013-09-20 MED ORDER — NITROGLYCERIN 0.2 MG/ML ON CALL CATH LAB
INTRAVENOUS | Status: AC
  Filled 2013-09-20: qty 1

## 2013-09-20 MED ORDER — SODIUM CHLORIDE 0.9 % IV SOLN: 1.0000 mL/kg/h | INTRAVENOUS | Status: DC

## 2013-09-20 MED ORDER — FENTANYL CITRATE 0.05 MG/ML IJ SOLN
INTRAMUSCULAR | Status: AC
  Filled 2013-09-20: qty 2

## 2013-09-20 MED ORDER — ASPIRIN 81 MG PO CHEW
81.0000 mg | CHEWABLE_TABLET | ORAL | Status: AC
  Administered 2013-09-20: 81 mg via ORAL
  Filled 2013-09-20: qty 1

## 2013-09-20 MED ORDER — MIDAZOLAM HCL 2 MG/2ML IJ SOLN
INTRAMUSCULAR | Status: AC
  Filled 2013-09-20: qty 2

## 2013-09-20 MED ORDER — HEPARIN (PORCINE) IN NACL 2-0.9 UNIT/ML-% IJ SOLN
INTRAMUSCULAR | Status: AC
  Filled 2013-09-20: qty 1000

## 2013-09-20 NOTE — CV Procedure (Signed)
CARDIAC CATHETERIZATION REPORT  [name] [name]   [id number] [birth year]  Performing Cardiologist: [name] Primary Physician: [name] [name], MD Primary Cardiologist:  Dr. Debara Pickett  Procedures Performed:  Left Heart Catheterization via 5 Fr right femoral artery access  Native Coronary Angiography  Saphenous vein graft angiography  Indication(s): dyspnea, fatigue and hypotension  Pre-Procedural Diagnosis(es):  1. Unstable angina 2. Orthostatic hypotension  Post-Procedural Diagnosis(es): 1. Unexplained orthostatic hypotension 2. Patent grafts  Pre-Procedural Non-invasive testing: LVEF of 44%, no ischemic changes (08/2013)  History: 68 y.o. male previously followed by Dr. Rollene Fare. His past medical history is significant for coronary artery disease. In 2013 he underwent multivessel CABG for an ischemic cardiomyopathy. EF was 20-25% prior to surgery however post surgery his EF had improved up to 45-50%. Recently his EF was 40-45% by echo in May of 2014. There was mild mitral regurgitation, mild to moderately dilated left atrium and mild LVH. [name] [name] has been describing some anxiety. He also reports some pain in his legs however underwent Dopplers in September 2014 which showed preserved ABIs bilaterally a 1.0 on the right and 1.1 on the left. The bilateral peroneal and posterior tibial arteries were occluded. He reports some optimal control of his diabetes. His A1c was 7.2. He is concerned about the cost of taking Tradjenta. He is followed by Dr. Elyse Hsu.   He was recently seen in the emergency room and [facility] hospital. There he presented with hypotension and was given 1 L normal saline and his symptoms improved. He had been having dizziness as well as some upper back pain into the left shoulder blade with exertion recently. The symptoms are worse particularly when climbing up ladders or going up stairs and are relieved at rest. After that hospitalization it was recommended  that he discontinue his ACE inhibitor and beta blocker, and his blood pressure appears improved today up to 110/60.   [name] [name] returns today for followup of his stress test. This is interpreted as nonischemic with an EF of 44%. He reports still feeling very fatigued and extremely short of breath when doing activities. He says that the other day he tried to hit golf balls and felt that he was totally exhausted after just an hour. He reports his blood pressure has improved somewhat off of all medications however remains low. He has had symptoms of orthostatic hypotension in the past. His symptoms do feel similar to prior to his bypass surgery. He also feels that he was much better after surgery than he is now and that his symptoms have been going on for the past 2 months.    Risks / Complications include, but not limited to: Death, MI, CVA/TIA, VF/VT (with defibrillation), Bradycardia (need for temporary pacer placement), contrast induced nephropathy, bleeding / bruising / hematoma / pseudoaneurysm, vascular or coronary injury (with possible emergent CT or Vascular Surgery), adverse medication reactions, infection.    Consent: Risks of procedure as well as the alternatives and risks of each were explained to the (patient/caregiver).  Consent for procedure obtained.  Procedure: The patient was brought to the 2nd [place] [facility]iac Catheterization Lab in the fasting state and prepped and draped in the usual sterile fashion for (Right groin) access.  Time Out: Verified patient identification, verified procedure, site/side was marked, verified correct patient position, special equipment/implants available, radiation safety measures in place (including badges and shielding), medications/allergies/relevent history reviewed, required imaging and test results available.  Performed  Procedure: The right femoral head was identified using  tactile and fluoroscopic technique.  The right groin was  anesthetized with 1% subcutaneous Lidocaine.  The right Common Femoral Artery was accessed using the Modified Seldinger Technique with placement of (5 Fr) sheath using the Seldinger technique.  The sheath was aspirated and flushed.  A 5 Fr JL4 Catheter was advanced of over a Standard J wire into the ascending Aorta.  The catheter was used to engage the left coronary artery.  Multiple cineangiographic views of the left coronary artery system(s) were performed. A 5 Fr JR4 Catheter was advanced of over a Safety J wire into the ascending Aorta.  The catheter was used to engage the right coronary artery and the SVG and LIMA grafts.  Multiple cineangiographic views of the right coronary artery system(s), SVG's and LIMA were performed. This catheter was then exchanged over the Standard J wire for an angled Pigtail catheter that was advanced across the Aortic Valve.  LV hemodynamics were measured and the catheter was pulled back across the Aortic Valve for measurement of "pull-back" gradient.  The catheter and the wire was removed completely out of the body. The patient was transferred to the holding area where the sheath was removed with manual pressure held for hemostasis.   Recovery: The patient was transported to the cath lab holding area in stable condition.   The patient  was stable before, during and following the procedure.   Patient did tolerate procedure well. There were not complications.  EBL: Minimal  Medications:  Premedication: none  Sedation:  2 mg IV Versed, 25 mcg IV Fentanyl  Contrast:  80 ml Omnipaque  Local Anesthesia: 3 cc 1% lidocaine  Hemodynamics:  Central Aortic Pressure / Mean Aortic Pressure: 131/61  LV Pressure / LV End diastolic Pressure:  13  Coronary Angiographic Data:  Left Main:  Distal calcified 30% stenosis.  Left Anterior Descending (LAD):  Courses down the anterior wall in usual fashion. 100% occluded in the mid-vessel. Gives off a small diagonal branch  1st  diagonal (D1):  Small caliber, diffusely diseased.   Circumflex (LCx):  Mild non-obstructive disease. Gives off 2 OM branches.  1st obtuse marginal:  Moderate sized, no significant obstruction  2nd obtuse marginal:  Smaller sized, patent   Right Coronary Artery: Patent with mild distal disease. Dominant vessel.  posterior descending artery: Appears to have high grade disease. Bypassed distally  posterior lateral branch: Near occluded proximally, bypassed distally  Grafts  LIMA - LAD: Large, smooth vessel with no insertion or anastamotic stenosis. TIMI III flow. Good distal runoff.  SVG - PLA: Large vessel, patent, no stenosis. TIMI III flow , good distal runoff  SVG - PDA: Smaller vessel, patent, no stenosis. TIMI III Flow, good distal runoff  Impression: 1.  2 vessel native CAD with occluded LAD in the mid-vessel 2.  Patent LIMA-LAD, SVG to PLA and SVG to PDA grafts with TIMI III flow 3.  LVEDP = 13 mmHg 4.  Random PVC's were noted  Plan: 1.  [name] [name] has patent grafts which agrees with his recent nuclear perfusion study. It is not clear why he is having problems with fatigue and orthostatic hypotension.  2.  I would recommend endocrinology evaluation, as he has a history of low testosterone (untreated), BG's are not well-controlled and ?if he may have low cortisol levels. 3.  I will continue to hold his BP medications, since he feels better. Since he is off of the b-blocker, PVC's are more prevalent now, but they are not felt to be  ischemia.  The case and results was discussed with the patient and family if available.  The case and results was not discussed with the patient's PCP. The case and results was discussed with the patient's Cardiologist.  Time Spent Directly with the Patient:  60 minutes  [name], MD, [facility] Attending Cardiologist [facility]  Taeko Schaffer C 09/20/2013, 9:38 AM

## 2013-09-20 NOTE — H&P (Addendum)
     INTERVAL PROCEDURE H&P  History and Physical Interval Note:  09/20/2013 7:46 AM  [name] [name] has presented today for their planned procedure. The various methods of treatment have been discussed with the patient and family. After consideration of risks, benefits and other options for treatment, the patient has consented to the procedure.  The patients' outpatient history has been reviewed, patient examined, and no change in status from most recent office note within the past 30 days. I have reviewed the patients' chart and labs and will proceed as planned. Questions were answered to the patient's satisfaction.   Cath Lab Visit (complete for each Cath Lab visit)  Clinical Evaluation Leading to the Procedure:   ACS: no  Non-ACS:    Anginal Classification: CCS III  Anti-ischemic medical therapy: Maximal Therapy (2 or more classes of medications)  Non-Invasive Test Results: Low-risk stress test findings: cardiac mortality <1%/year  Prior CABG: Previous CABG  [name] Casino, MD, [facility] Attending Cardiologist [facility]  [name] [name] 09/20/2013, 7:46 AM

## 2013-09-20 NOTE — Discharge Instructions (Signed)
Angiography, Care After Refer to this sheet in the next few weeks. These instructions provide you with information on caring for yourself after your procedure. Your health care provider may also give you more specific instructions. Your treatment has been planned according to current medical practices, but problems sometimes occur. Call your health care provider if you have any problems or questions after your procedure.  WHAT TO EXPECT AFTER THE PROCEDURE After your procedure, it is typical to have the following sensations:  Minor discomfort or tenderness and a small bump at the catheter insertion site. The bump should usually decrease in size and tenderness within 1 to 2 weeks.  Any bruising will usually fade within 2 to 4 weeks. HOME CARE INSTRUCTIONS   You may need to keep taking blood thinners if they were prescribed for you. Only take over-the-counter or prescription medicines for pain, fever, or discomfort as directed by your health care provider.  Do not apply powder or lotion to the site.  Do not sit in a bathtub, swimming pool, or whirlpool for 5 to 7 days.  You may shower 24 hours after the procedure. Remove the bandage (dressing) and gently wash the site with plain soap and water. Gently pat the site dry.  Inspect the site at least twice daily.  Limit your activity for the first 48 hours. Do not bend, squat, or lift anything over 20 lb (9 kg) or as directed by your health care provider.  Do not drive home if you are discharged the day of the procedure. Have someone else drive you. Follow instructions about when you can drive or return to work. SEEK MEDICAL CARE IF:  You get lightheaded when standing up.  You have drainage (other than a small amount of blood on the dressing).  You have chills.  You have a fever.  You have redness, warmth, swelling, or pain at the insertion site. SEEK IMMEDIATE MEDICAL CARE IF:   You develop chest pain or shortness of breath, feel faint,  or pass out.  You have bleeding, swelling larger than a walnut, or drainage from the catheter insertion site.  You develop pain, discoloration, coldness, or severe bruising in the leg or arm that held the catheter.  You have heavy bleeding from the site. If this happens, hold pressure on the site. MAKE SURE YOU:  Understand these instructions.  Will watch your condition.  Will get help right away if you are not doing well or get worse. Document Released: 01/01/2005 Document Revised: 02/15/2013 Document Reviewed: 11/07/2012 [facility] Patient Information 2014 [place].

## 2013-09-25 DIAGNOSIS — E7800 Pure hypercholesterolemia, unspecified: Secondary | ICD-10-CM | POA: Diagnosis not present

## 2013-09-25 DIAGNOSIS — I10 Essential (primary) hypertension: Secondary | ICD-10-CM | POA: Diagnosis not present

## 2013-09-25 DIAGNOSIS — IMO0001 Reserved for inherently not codable concepts without codable children: Secondary | ICD-10-CM | POA: Diagnosis not present

## 2013-09-28 ENCOUNTER — Telehealth: Payer: Self-pay | Admitting: *Deleted

## 2013-09-28 DIAGNOSIS — R7309 Other abnormal glucose: Secondary | ICD-10-CM

## 2013-09-28 DIAGNOSIS — R7989 Other specified abnormal findings of blood chemistry: Secondary | ICD-10-CM

## 2013-09-28 NOTE — Telephone Encounter (Signed)
Referral placed per cath report note from Dr. Debara Pickett

## 2013-09-28 NOTE — Telephone Encounter (Signed)
Pt wanted to know if they can get a referral to an endocrinologist. [facility] [place])  [facility]

## 2013-09-28 NOTE — Telephone Encounter (Signed)
Dr. Debara Pickett mentioned in Cath note that he recommends endocrinology evaluation.  Will defer this to Dr. Josepha Pigg, RN.

## 2013-10-02 NOTE — Telephone Encounter (Signed)
Thanks

## 2013-10-03 ENCOUNTER — Telehealth: Payer: Self-pay | Admitting: Internal Medicine

## 2013-10-03 NOTE — Telephone Encounter (Signed)
Faxed referral form to Dr. Katina Degree office.

## 2013-10-04 DIAGNOSIS — N400 Enlarged prostate without lower urinary tract symptoms: Secondary | ICD-10-CM | POA: Diagnosis not present

## 2013-10-04 DIAGNOSIS — I509 Heart failure, unspecified: Secondary | ICD-10-CM | POA: Diagnosis not present

## 2013-10-04 DIAGNOSIS — R5381 Other malaise: Secondary | ICD-10-CM | POA: Diagnosis not present

## 2013-10-04 DIAGNOSIS — IMO0001 Reserved for inherently not codable concepts without codable children: Secondary | ICD-10-CM | POA: Diagnosis not present

## 2013-10-04 DIAGNOSIS — E7800 Pure hypercholesterolemia, unspecified: Secondary | ICD-10-CM | POA: Diagnosis not present

## 2013-10-04 DIAGNOSIS — I10 Essential (primary) hypertension: Secondary | ICD-10-CM | POA: Diagnosis not present

## 2013-10-04 DIAGNOSIS — I259 Chronic ischemic heart disease, unspecified: Secondary | ICD-10-CM | POA: Diagnosis not present

## 2013-10-04 DIAGNOSIS — G4733 Obstructive sleep apnea (adult) (pediatric): Secondary | ICD-10-CM | POA: Diagnosis not present

## 2013-10-04 DIAGNOSIS — R5383 Other fatigue: Secondary | ICD-10-CM | POA: Diagnosis not present

## 2013-10-10 DIAGNOSIS — K625 Hemorrhage of anus and rectum: Secondary | ICD-10-CM | POA: Diagnosis not present

## 2013-10-10 DIAGNOSIS — K921 Melena: Secondary | ICD-10-CM | POA: Diagnosis not present

## 2013-10-10 DIAGNOSIS — Z951 Presence of aortocoronary bypass graft: Secondary | ICD-10-CM | POA: Diagnosis not present

## 2013-10-10 DIAGNOSIS — I519 Heart disease, unspecified: Secondary | ICD-10-CM | POA: Diagnosis not present

## 2013-10-10 DIAGNOSIS — I10 Essential (primary) hypertension: Secondary | ICD-10-CM | POA: Diagnosis not present

## 2013-10-10 DIAGNOSIS — Z79899 Other long term (current) drug therapy: Secondary | ICD-10-CM | POA: Diagnosis not present

## 2013-10-10 DIAGNOSIS — Z7982 Long term (current) use of aspirin: Secondary | ICD-10-CM | POA: Diagnosis not present

## 2013-10-10 DIAGNOSIS — K5900 Constipation, unspecified: Secondary | ICD-10-CM | POA: Diagnosis not present

## 2013-10-10 DIAGNOSIS — E119 Type 2 diabetes mellitus without complications: Secondary | ICD-10-CM | POA: Diagnosis not present

## 2013-10-13 ENCOUNTER — Encounter: Payer: Self-pay | Admitting: Internal Medicine

## 2013-10-13 ENCOUNTER — Ambulatory Visit (INDEPENDENT_AMBULATORY_CARE_PROVIDER_SITE_OTHER): Payer: Medicare Other | Admitting: Internal Medicine

## 2013-10-13 VITALS — BP 132/78 | HR 72 | Ht 74.0 in | Wt 239.7 lb

## 2013-10-13 DIAGNOSIS — R0609 Other forms of dyspnea: Secondary | ICD-10-CM

## 2013-10-13 DIAGNOSIS — R079 Chest pain, unspecified: Secondary | ICD-10-CM

## 2013-10-13 DIAGNOSIS — R0989 Other specified symptoms and signs involving the circulatory and respiratory systems: Secondary | ICD-10-CM | POA: Diagnosis not present

## 2013-10-13 DIAGNOSIS — R5383 Other fatigue: Secondary | ICD-10-CM | POA: Insufficient documentation

## 2013-10-13 DIAGNOSIS — R0600 Dyspnea, unspecified: Secondary | ICD-10-CM

## 2013-10-13 DIAGNOSIS — I2510 Atherosclerotic heart disease of native coronary artery without angina pectoris: Secondary | ICD-10-CM | POA: Diagnosis not present

## 2013-10-13 DIAGNOSIS — I10 Essential (primary) hypertension: Secondary | ICD-10-CM | POA: Diagnosis not present

## 2013-10-13 DIAGNOSIS — R5381 Other malaise: Secondary | ICD-10-CM

## 2013-10-13 MED ORDER — ATORVASTATIN CALCIUM 40 MG PO TABS: 40.0000 mg | ORAL_TABLET | Freq: Every day | ORAL | Status: DC

## 2013-10-13 NOTE — Progress Notes (Signed)
OFFICE NOTE  Chief Complaint:  Anxiety, mild dyspnea  Primary Care Physician: [name] [name], MD  HPI:  [name] [name] is a pleasant 68 year old gentleman previously followed by Dr. Rollene Fare. His past medical history is significant for coronary artery disease. In 2013 he underwent multivessel CABG for an ischemic cardiomyopathy. EF was 20-25% prior to surgery however post surgery his EF had improved up to 45-50%. Recently his EF was 40-45% by echo in May of 2014. There was mild mitral regurgitation, mild to moderately dilated left atrium and mild LVH. [name] [name] has been describing some anxiety. He also reports some pain in his legs however underwent Dopplers in September 2014 which showed preserved ABIs bilaterally a 1.0 on the right and 1.1 on the left. The bilateral peroneal and posterior tibial arteries were occluded. He reports some optimal control of his diabetes. His A1c was 7.2. He is concerned about the cost of taking Tradjenta. He is followed by Dr. Elyse Hsu.   He was recently [facility] There he presented with hypotension and was given 1 L normal saline and his symptoms improved. He had been having dizziness as well as some upper back pain into the left shoulder blade with exertion recently. The symptoms are worse particularly when climbing up ladders or going up stairs and are relieved at rest. After that hospitalization it was recommended that he discontinue his ACE inhibitor and beta blocker, and his blood pressure appears improved today up to 110/60.  [name] [name] returns today for followup of his stress test. This is interpreted as nonischemic with an EF of 44%. He reports still feeling very fatigued and extremely short of breath when doing activities. He says that the other day he tried to hit golf balls and felt that he was totally exhausted after just an hour. He reports his blood pressure has improved somewhat off of all medications  however remains low. He has had symptoms of orthostatic hypotension in the past. His symptoms do feel similar to prior to his bypass surgery. He also feels that he was much better after surgery than he is now and that his symptoms have been going on for the past 2 months.  At his last office visit, I recommended that [name] [name] have a repeat cardiac catheterization. This demonstrated the following:  Impression:  1. 2 vessel native CAD with occluded LAD in the mid-vessel  2. Patent LIMA-LAD, SVG to PLA and SVG to PDA grafts with TIMI III flow  3. LVEDP = 13 mmHg  4. Random PVC's were noted  Based on these findings, I could not find any new cardiac cause of his symptoms. I suspect that some of his fatigue could be related to labile blood sugars. He says unexplained hypotension but is normotensive off of medications.  PMHx:  Past Medical History  Diagnosis Date  . Diabetes mellitus   . Coronary artery disease   . High cholesterol   . Hypertension   . Stroke, Wallenberg's syndrome   . Right bundle branch block   . Ischemic cardiomyopathy   . Sleep apnea     sleep study 10/2010- AHI during total sleep 32.1/hr and during REM 62.3/hr (severe sleep apnea)  . CAD (coronary artery disease) 07/06/2011  . LV dysfunction 07/06/2011  . Stroke     Wallenberg   . CHF (congestive heart failure)   . Chronic kidney disease     enlarged prostate  . 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 Ellison Hughs  . BPH (benign prostatic hyperplasia)   . History of nuclear stress test 09/2010    dipyridamole; mild perfusion defect due to attenuation with mild superimposed ischemia in apical septal, apical, apical inferior & apical lateral regions; rest LV enlarged in size; prominent gut uptake in infero-apical region; no significant ischemia demonstrated; low risk scan     Past Surgical History   Procedure Laterality Date  . Cardiac catheterization  01/2011    ischemic cardiomyopathy 30-35%, multivessel CAD (Dr. [name])   . Pilonidal cyst removed    . Colonscopy    . Coronary artery bypass graft  08/03/2011    Procedure: CORONARY ARTERY BYPASS GRAFTING (CABG);  Surgeon: [name], MD;  Location: [place];  Service: Open Heart Surgery;  Laterality: N/A;  CABG times three using right saphenous vein and left mammary artery usisng endoscope.  . Transthoracic echocardiogram  11/10/2012    EF 40-45%, mild LVH, mild hypokinesis of anteroseptal myocardium, grade 1 diastolic dysfunction; mild MR & calcifed mitral annulus; LA mild-mod dilated; RA mildly dilated  . Lower extremity arterial doppler  03/16/2013    bilat ABIs demonstated normal values; R runoff - posterior tibial & anterior tibial arteries occluded; L runoff - peroneal & posterior tibial arteries occluded, anterior tibial artery appears occluded    FAMHx:  Family History  Problem Relation Age of Onset  . Anesthesia problems Daughter   . Cancer Mother   . Lung disease Father     & heart disease  . Colon cancer Sister   . Sudden death Maternal Grandmother     SOCHx:   reports that he has quit smoking. His smoking use included Cigars. He quit smokeless tobacco use about 3 years ago. He reports that he drinks alcohol. He reports that he does not use illicit drugs.  ALLERGIES:  No Known Allergies  ROS: A comprehensive review of systems was negative except for: Constitutional: positive for fatigue Respiratory: positive for dyspnea on exertion Cardiovascular: positive for chest pain Behavioral/Psych: positive for anxiety  HOME MEDS: Current Outpatient Prescriptions  Medication Sig Dispense Refill  . aspirin 325 MG tablet Take 325 mg by mouth daily.      . Canagliflozin (INVOKANA) 300 MG TABS Take 300 mg by mouth daily.       . Cholecalciferol (VITAMIN D3) 5000 UNITS CAPS Take 1 capsule by mouth daily.      Marland Kitchen Corn  Dextrin (EQL FIBER SUPPLEMENT PO) Take 1 capsule by mouth at bedtime.       Marland Kitchen glipiZIDE (GLUCOTROL XL) 10 MG 24 hr tablet Take 10 mg by mouth every morning.       Marland Kitchen ibuprofen (ADVIL,MOTRIN) 200 MG tablet Take 400 mg by mouth daily as needed for mild pain or moderate pain.      . metFORMIN (GLUMETZA) 500 MG (MOD) 24 hr tablet Take 500 mg by mouth 2 (two) times daily.       Marland Kitchen spironolactone (ALDACTONE) 25 MG tablet Take 0.5 tablets (12.5 mg total) by mouth daily.  15 tablet  10  . atorvastatin (LIPITOR) 40 MG tablet Take 1 tablet (40 mg total) by mouth daily.  90 tablet  3   No current facility-administered medications for this visit.    LABS/IMAGING: No results found for this or any previous visit (from  the past 48 hour(s)). No results found.  VITALS: BP 132/78  Pulse 72  Ht 6\' 2"  (1.88 m)  Wt 239 lb 11.2 oz (108.727 kg)  BMI 30.76 kg/m2  EXAM: deferred  EKG: deferred  ASSESSMENT: 1. Coronary artery disease status post three-vessel CABG in 2013 (LIMA to LAD, SVG to PDA and SVG to PLA) 2. Hypertension-controlled 3. Dyslipidemia 4. Diabetes type 2 5. Mild PVD-with normal ABI 6. Hypotension/upper back pain - similar to pain he had in the past, may be an anginal equivalent  PLAN: 1.   [name] [name] to low risk nuclear stress test with EF of 44% which appears preserved. Despite this, his symptoms continue and he is short of breath. Cardiac catheterization was performed and does not show any new obstructive coronary disease. It is not clear to me what is causing his continued symptoms. Some of his fatigue and shortness of breath may be due to labile blood sugars. In addition he has been hypotensive and I had to stop his blood pressure medications. I wonder if this could be related to an unusual cause such as adrenal insufficiency. 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  I strongly recommended endocrine evaluation, given his  history of hypothyroidism and difficult to control diabetes. Finally, he reports that Crestor is now too expensive due to changes in his insurance company tiers. I would recommend switching him to atorvastatin 40 mg daily.  [name], MD, [facility] Attending Cardiologist [place] 10/13/2013, 6:37 PM

## 2013-10-13 NOTE — Patient Instructions (Addendum)
If crestor in a 90 day supply is too costly, please let us know. We have already sent in a prescription for an alternative (atorvastatin 40mg  once daily)  Your physician wants you to follow-up in: 6 months. You will receive a reminder letter in the mail two months in advance. If you don't receive a letter, please call our office to schedule the follow-up appointment.

## 2013-10-26 DIAGNOSIS — K625 Hemorrhage of anus and rectum: Secondary | ICD-10-CM | POA: Diagnosis not present

## 2013-11-07 NOTE — Telephone Encounter (Signed)
Close encounter 

## 2013-12-21 DIAGNOSIS — E669 Obesity, unspecified: Secondary | ICD-10-CM | POA: Diagnosis not present

## 2013-12-21 DIAGNOSIS — E1142 Type 2 diabetes mellitus with diabetic polyneuropathy: Secondary | ICD-10-CM | POA: Diagnosis not present

## 2013-12-21 DIAGNOSIS — E1149 Type 2 diabetes mellitus with other diabetic neurological complication: Secondary | ICD-10-CM | POA: Diagnosis not present

## 2014-01-10 DIAGNOSIS — M48061 Spinal stenosis, lumbar region without neurogenic claudication: Secondary | ICD-10-CM | POA: Diagnosis not present

## 2014-01-12 DIAGNOSIS — I10 Essential (primary) hypertension: Secondary | ICD-10-CM | POA: Diagnosis not present

## 2014-01-12 DIAGNOSIS — E7800 Pure hypercholesterolemia, unspecified: Secondary | ICD-10-CM | POA: Diagnosis not present

## 2014-01-12 DIAGNOSIS — R5381 Other malaise: Secondary | ICD-10-CM | POA: Diagnosis not present

## 2014-01-12 DIAGNOSIS — E119 Type 2 diabetes mellitus without complications: Secondary | ICD-10-CM | POA: Diagnosis not present

## 2014-01-12 DIAGNOSIS — I509 Heart failure, unspecified: Secondary | ICD-10-CM | POA: Diagnosis not present

## 2014-01-12 DIAGNOSIS — R5383 Other fatigue: Secondary | ICD-10-CM | POA: Diagnosis not present

## 2014-01-17 DIAGNOSIS — Z23 Encounter for immunization: Secondary | ICD-10-CM | POA: Diagnosis not present

## 2014-01-17 DIAGNOSIS — E7800 Pure hypercholesterolemia, unspecified: Secondary | ICD-10-CM | POA: Diagnosis not present

## 2014-01-17 DIAGNOSIS — I10 Essential (primary) hypertension: Secondary | ICD-10-CM | POA: Diagnosis not present

## 2014-01-17 DIAGNOSIS — IMO0001 Reserved for inherently not codable concepts without codable children: Secondary | ICD-10-CM | POA: Diagnosis not present

## 2014-01-17 DIAGNOSIS — I509 Heart failure, unspecified: Secondary | ICD-10-CM | POA: Diagnosis not present

## 2014-01-17 DIAGNOSIS — I259 Chronic ischemic heart disease, unspecified: Secondary | ICD-10-CM | POA: Diagnosis not present

## 2014-01-17 DIAGNOSIS — N400 Enlarged prostate without lower urinary tract symptoms: Secondary | ICD-10-CM | POA: Diagnosis not present

## 2014-01-17 DIAGNOSIS — G4733 Obstructive sleep apnea (adult) (pediatric): Secondary | ICD-10-CM | POA: Diagnosis not present

## 2014-01-18 DIAGNOSIS — M545 Low back pain, unspecified: Secondary | ICD-10-CM | POA: Diagnosis not present

## 2014-02-01 ENCOUNTER — Other Ambulatory Visit: Payer: Self-pay | Admitting: *Deleted

## 2014-02-01 MED ORDER — SPIRONOLACTONE 25 MG PO TABS: 12.5000 mg | ORAL_TABLET | Freq: Every day | ORAL | Status: DC

## 2014-02-02 ENCOUNTER — Other Ambulatory Visit: Payer: Self-pay

## 2014-02-02 MED ORDER — SPIRONOLACTONE 25 MG PO TABS: 12.5000 mg | ORAL_TABLET | Freq: Every day | ORAL | Status: DC

## 2014-02-02 NOTE — Telephone Encounter (Signed)
Rx was sent to pharmacy electronically. 

## 2014-02-05 DIAGNOSIS — M48061 Spinal stenosis, lumbar region without neurogenic claudication: Secondary | ICD-10-CM | POA: Diagnosis not present

## 2014-02-05 DIAGNOSIS — G619 Inflammatory polyneuropathy, unspecified: Secondary | ICD-10-CM | POA: Diagnosis not present

## 2014-02-05 DIAGNOSIS — G622 Polyneuropathy due to other toxic agents: Secondary | ICD-10-CM | POA: Diagnosis not present

## 2014-02-19 DIAGNOSIS — M48061 Spinal stenosis, lumbar region without neurogenic claudication: Secondary | ICD-10-CM | POA: Diagnosis not present

## 2014-03-02 DIAGNOSIS — G5790 Unspecified mononeuropathy of unspecified lower limb: Secondary | ICD-10-CM | POA: Diagnosis not present

## 2014-03-02 DIAGNOSIS — M722 Plantar fascial fibromatosis: Secondary | ICD-10-CM | POA: Diagnosis not present

## 2014-04-02 ENCOUNTER — Encounter: Payer: Self-pay | Admitting: Internal Medicine

## 2014-04-02 ENCOUNTER — Ambulatory Visit (INDEPENDENT_AMBULATORY_CARE_PROVIDER_SITE_OTHER): Payer: Medicare Other | Admitting: Internal Medicine

## 2014-04-02 VITALS — BP 132/74 | HR 85 | Ht 74.0 in | Wt 248.0 lb

## 2014-04-02 DIAGNOSIS — I10 Essential (primary) hypertension: Secondary | ICD-10-CM | POA: Diagnosis not present

## 2014-04-02 DIAGNOSIS — I2510 Atherosclerotic heart disease of native coronary artery without angina pectoris: Secondary | ICD-10-CM | POA: Diagnosis not present

## 2014-04-02 DIAGNOSIS — I255 Ischemic cardiomyopathy: Secondary | ICD-10-CM | POA: Diagnosis not present

## 2014-04-02 DIAGNOSIS — E119 Type 2 diabetes mellitus without complications: Secondary | ICD-10-CM

## 2014-04-02 DIAGNOSIS — Z951 Presence of aortocoronary bypass graft: Secondary | ICD-10-CM | POA: Diagnosis not present

## 2014-04-02 NOTE — Patient Instructions (Signed)
Your physician wants you to follow-up in: 6 months with Dr. [name]. You will receive a reminder letter in the mail two months in advance. If you don't receive a letter, please call our office to schedule the follow-up appointment.    

## 2014-04-02 NOTE — Progress Notes (Signed)
OFFICE NOTE  Chief Complaint:  Anxiety, mild dyspnea  Primary Care Physician: [name] [name], MD  HPI:  [name] [name] is a pleasant 68 year old gentleman previously followed by Dr. Rollene Fare. His past medical history is significant for coronary artery disease. In 2013 he underwent multivessel CABG for an ischemic cardiomyopathy. EF was 20-25% prior to surgery however post surgery his EF had improved up to 45-50%. Recently his EF was 40-45% by echo in May of 2014. There was mild mitral regurgitation, mild to moderately dilated left atrium and mild LVH. [name] [name] has been describing some anxiety. He also reports some pain in his legs however underwent Dopplers in September 2014 which showed preserved ABIs bilaterally a 1.0 on the right and 1.1 on the left. The bilateral peroneal and posterior tibial arteries were occluded. He reports some optimal control of his diabetes. His A1c was 7.2. He is concerned about the cost of taking Tradjenta. He is followed by Dr. Elyse Hsu.   He was recently seen in [facility]. There he presented with hypotension and was given 1 L normal saline and his symptoms improved. He had been having dizziness as well as some upper back pain into the left shoulder blade with exertion recently. The symptoms are worse particularly when climbing up ladders or going up stairs and are relieved at rest. After that hospitalization it was recommended that he discontinue his ACE inhibitor and beta blocker, and his blood pressure appears improved today up to 110/60.  [name] [name] returns today for followup of his stress test. This is interpreted as nonischemic with an EF of 44%. He reports still feeling very fatigued and extremely short of breath when doing activities. He says that the other day he tried to hit golf balls and felt that he was totally exhausted after just an hour. He reports his blood pressure has improved somewhat off of all medications  however remains low. He has had symptoms of orthostatic hypotension in the past. His symptoms do feel similar to prior to his bypass surgery. He also feels that he was much better after surgery than he is now and that his symptoms have been going on for the past 2 months.  At his last office visit, I recommended that [name] [name] have a repeat cardiac catheterization. This demonstrated the following:  Impression:  1. 2 vessel native CAD with occluded LAD in the mid-vessel  2. Patent LIMA-LAD, SVG to PLA and SVG to PDA grafts with TIMI III flow  3. LVEDP = 13 mmHg  4. Random PVC's were noted  Based on these findings, I could not find any new cardiac cause of his symptoms. I suspect that some of his fatigue could be related to labile blood sugars. He says unexplained hypotension but is normotensive off of medications.  [name] [name] returns today for followup. He now reports feeling better since he had change in his medications. He is established with Dr. Buddy Duty who adjusted his diabetes medicines and stopped his Invokana. His shortness of breath and fatigue in both improved. He is now been expected some problems with left heel pain. He was found to have some spinal stenosis but has had improvement with inserts in his shoes.  PMHx:  Past Medical History  Diagnosis Date  . Diabetes mellitus   . Coronary artery disease   . High cholesterol   . Hypertension   . Stroke, Wallenberg's syndrome   . Right bundle branch block   .  Ischemic cardiomyopathy   . Sleep apnea     sleep study 10/2010- AHI during total sleep 32.1/hr and during REM 62.3/hr (severe sleep apnea)  . CAD (coronary artery disease) 07/06/2011  . LV dysfunction 07/06/2011  . Stroke     Wallenberg   . CHF (congestive heart failure)   . Chronic kidney disease     enlarged prostate  . Arthritis     Knee L - probably  . 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  Ellison Hughs  . BPH (benign prostatic hyperplasia)   . History of nuclear stress test 09/2010    dipyridamole; mild perfusion defect due to attenuation with mild superimposed ischemia in apical septal, apical, apical inferior & apical lateral regions; rest LV enlarged in size; prominent gut uptake in infero-apical region; no significant ischemia demonstrated; low risk scan     Past Surgical History  Procedure Laterality Date  . Cardiac catheterization  01/2011    ischemic cardiomyopathy 30-35%, multivessel CAD (Dr. [name])   . Pilonidal cyst removed    . Colonscopy    . Coronary artery bypass graft  08/03/2011    Procedure: CORONARY ARTERY BYPASS GRAFTING (CABG);  Surgeon: [name], MD;  Location: [place];  Service: Open Heart Surgery;  Laterality: N/A;  CABG times three using right saphenous vein and left mammary artery usisng endoscope.  . Transthoracic echocardiogram  11/10/2012    EF 40-45%, mild LVH, mild hypokinesis of anteroseptal myocardium, grade 1 diastolic dysfunction; mild MR & calcifed mitral annulus; LA mild-mod dilated; RA mildly dilated  . Lower extremity arterial doppler  03/16/2013    bilat ABIs demonstated normal values; R runoff - posterior tibial & anterior tibial arteries occluded; L runoff - peroneal & posterior tibial arteries occluded, anterior tibial artery appears occluded    FAMHx:  Family History  Problem Relation Age of Onset  . Anesthesia problems Daughter   . Cancer Mother   . Lung disease Father     & heart disease  . Colon cancer Sister   . Sudden death Maternal Grandmother     SOCHx:   reports that he has quit smoking. His smoking use included Cigars. He quit smokeless tobacco use about 4 years ago. He reports that he drinks alcohol. He reports that he does not use illicit drugs.  ALLERGIES:  No Known Allergies  ROS: A comprehensive review of systems was negative except for: Musculoskeletal: positive for back pain and left heel pain  HOME  MEDS: Current Outpatient Prescriptions  Medication Sig Dispense Refill  . aspirin 325 MG tablet Take 325 mg by mouth daily.      . Cholecalciferol (VITAMIN D3) 5000 UNITS CAPS Take 1 capsule by mouth daily.      Marland Kitchen Corn Dextrin (EQL FIBER SUPPLEMENT PO) Take 1 capsule by mouth at bedtime.       . CRESTOR 40 MG tablet Take 20 mg by mouth daily.       Marland Kitchen glipiZIDE (GLUCOTROL XL) 10 MG 24 hr tablet Take 10 mg by mouth every morning.       Marland Kitchen ibuprofen (ADVIL,MOTRIN) 200 MG tablet Take 400 mg by mouth daily as needed for mild pain or moderate pain.      . metFORMIN (GLUMETZA) 500 MG (MOD) 24 hr tablet Take 500 mg by mouth 2 (two) times daily.       Marland Kitchen spironolactone (ALDACTONE)  25 MG tablet Take 0.5 tablets (12.5 mg total) by mouth daily.  15 tablet  9  . TRADJENTA 5 MG TABS tablet Take 1 tablet by mouth daily.       No current facility-administered medications for this visit.    LABS/IMAGING: No results found for this or any previous visit (from the past 48 hour(s)). No results found.  VITALS: BP 132/74  Pulse 85  Ht 6\' 2"  (1.88 m)  Wt 248 lb (112.492 kg)  BMI 31.83 kg/m2  EXAM: General appearance: alert and no distress Neck: no carotid bruit and no JVD Lungs: clear to auscultation bilaterally Heart: regular rate and rhythm, S1, S2 normal, no murmur, click, rub or gallop Abdomen: soft, non-tender; bowel sounds normal; no masses,  no organomegaly Extremities: extremities normal, atraumatic, no cyanosis or edema Pulses: 2+ bilateral DP, 2+ L PT, 1+ R PT Skin: Skin color, texture, turgor normal. No rashes or lesions Neurologic: Grossly normal Psych: Pleasant  EKG: Sinus rhythm with first degree AV block and left bundle branch block at 85, fusion beats  ASSESSMENT: 1. Coronary artery disease status post three-vessel CABG in 2013 (LIMA to LAD, SVG to PDA and SVG to PLA) 2. Hypertension-controlled 3. Dyslipidemia 4. Diabetes type 2 - better controlled 5. Mild PVD-with normal  ABI 6. Hypotension/upper back pain - resolved 7. 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  PLAN: 1.   Mr. Lafon has had improvement in his fatigue and shortness of breath with changes in his diabetes medicines. He is now to problems with left heel pain which is also better with shoe inserts and has seen a number of different orthopedics and was noted to have some spinal stenosis. Cardiac standpoint he is not having any angina or worsening shortness of breath at this time. Blood pressure is well controlled. In September 2014 he had bilateral ABIs which show normal blood flow of 1.1 in both legs. He does have an occluded posterior tibial on the right. Collaterals are noted. I would continue his current medications for now and plan to see him back in 6 months.  [name], MD, [facility] Attending Cardiologist [facility]  [name],Kenneth C 04/02/2014, 9:38 AM

## 2014-04-05 DIAGNOSIS — G5792 Unspecified mononeuropathy of left lower limb: Secondary | ICD-10-CM | POA: Diagnosis not present

## 2014-04-25 DIAGNOSIS — M5136 Other intervertebral disc degeneration, lumbar region: Secondary | ICD-10-CM | POA: Diagnosis not present

## 2014-04-27 ENCOUNTER — Ambulatory Visit: Payer: Medicare Other | Admitting: Internal Medicine

## 2014-05-07 DIAGNOSIS — E119 Type 2 diabetes mellitus without complications: Secondary | ICD-10-CM | POA: Diagnosis not present

## 2014-05-07 DIAGNOSIS — I10 Essential (primary) hypertension: Secondary | ICD-10-CM | POA: Diagnosis not present

## 2014-05-07 DIAGNOSIS — E780 Pure hypercholesterolemia: Secondary | ICD-10-CM | POA: Diagnosis not present

## 2014-05-16 DIAGNOSIS — K5901 Slow transit constipation: Secondary | ICD-10-CM | POA: Diagnosis not present

## 2014-05-16 DIAGNOSIS — Z23 Encounter for immunization: Secondary | ICD-10-CM | POA: Diagnosis not present

## 2014-05-16 DIAGNOSIS — N401 Enlarged prostate with lower urinary tract symptoms: Secondary | ICD-10-CM | POA: Diagnosis not present

## 2014-05-16 DIAGNOSIS — E1165 Type 2 diabetes mellitus with hyperglycemia: Secondary | ICD-10-CM | POA: Diagnosis not present

## 2014-05-16 DIAGNOSIS — I10 Essential (primary) hypertension: Secondary | ICD-10-CM | POA: Diagnosis not present

## 2014-05-16 DIAGNOSIS — I5022 Chronic systolic (congestive) heart failure: Secondary | ICD-10-CM | POA: Diagnosis not present

## 2014-05-16 DIAGNOSIS — I255 Ischemic cardiomyopathy: Secondary | ICD-10-CM | POA: Diagnosis not present

## 2014-05-21 DIAGNOSIS — M4806 Spinal stenosis, lumbar region: Secondary | ICD-10-CM | POA: Diagnosis not present

## 2014-06-07 ENCOUNTER — Encounter (HOSPITAL_COMMUNITY): Payer: Self-pay | Admitting: Internal Medicine

## 2014-08-23 DIAGNOSIS — M4806 Spinal stenosis, lumbar region: Secondary | ICD-10-CM | POA: Diagnosis not present

## 2014-09-03 DIAGNOSIS — M4806 Spinal stenosis, lumbar region: Secondary | ICD-10-CM | POA: Diagnosis not present

## 2014-09-04 DIAGNOSIS — M4806 Spinal stenosis, lumbar region: Secondary | ICD-10-CM | POA: Diagnosis not present

## 2014-09-05 ENCOUNTER — Encounter (HOSPITAL_COMMUNITY): Payer: Self-pay

## 2014-09-05 ENCOUNTER — Ambulatory Visit: Payer: Self-pay | Admitting: Orthopedic Surgery

## 2014-09-05 NOTE — Progress Notes (Signed)
Please put orders in Epic surgery 09-12-14 pre op 09-07-14 Thanks

## 2014-09-05 NOTE — H&P (Signed)
[name] [name] is an 69 y.o. male.   Chief Complaint: back and left leg pain HPI: The patient is a 69 year old male who presents today for follow up of their back. The patient is being followed for their back pain. Symptoms reported today include: pain, numbness (lateral aspect of his calf) and leg pain (numbness and some tingling but not really pain.). The patient states that they are doing well. Current treatment includes: activity modification. The following medication has been used for pain control: antiinflammatory medication (Aleve, helps some). The patient presents today following MRI.  [name] [name] follows up. He is here with his daughter. He has had less pain, but fairly weak in the leg and difficulty walking. I called him this morning with the result of his MRI. His MRI does show that he has a large disk herniation that is extruded at 4-5 migrating in cephalad behind the body of 4.   Past Medical History  Diagnosis Date  . Diabetes mellitus   . Coronary artery disease   . High cholesterol   . Hypertension   . Stroke, Wallenberg's syndrome   . Ischemic cardiomyopathy   . Sleep apnea     sleep study 10/2010- AHI during total sleep 32.1/hr and during REM 62.3/hr (severe sleep apnea)  . CAD (coronary artery disease) 07/06/2011  . LV dysfunction 07/06/2011  . Stroke     Wallenberg   . CHF (congestive heart failure)   . Chronic kidney disease     enlarged prostate  . Arthritis     Knee L - probably  . 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 Ellison Hughs  . BPH (benign prostatic hyperplasia)   . History of nuclear stress test 09/2010    dipyridamole; mild perfusion defect due to attenuation with mild superimposed ischemia in apical septal, apical, apical inferior & apical lateral regions; rest LV enlarged in size; prominent gut uptake in infero-apical region; no significant ischemia demonstrated; low risk  scan   . Right bundle branch block     Past Surgical History  Procedure Laterality Date  . Cardiac catheterization  01/2011    ischemic cardiomyopathy 30-35%, multivessel CAD (Dr. [name])   . Pilonidal cyst removed    . Colonscopy    . Coronary artery bypass graft  08/03/2011    Procedure: CORONARY ARTERY BYPASS GRAFTING (CABG);  Surgeon: [name], MD;  Location: [facility];  Service: Open Heart Surgery;  Laterality: N/A;  CABG times three using right saphenous vein and left mammary artery usisng endoscope.  . Transthoracic echocardiogram  11/10/2012    EF 40-45%, mild LVH, mild hypokinesis of anteroseptal myocardium, grade 1 diastolic dysfunction; mild MR & calcifed mitral annulus; LA mild-mod dilated; RA mildly dilated  . Lower extremity arterial doppler  03/16/2013    bilat ABIs demonstated normal values; R runoff - posterior tibial & anterior tibial arteries occluded; L runoff - peroneal & posterior tibial arteries occluded, anterior tibial artery appears occluded  . Left heart catheterization with coronary/graft angiogram N/A 09/20/2013    Procedure: LEFT HEART CATHETERIZATION WITH [name];  Surgeon: [name], MD;  Location: [facility] CATH LAB;  Service: Cardiovascular;  Laterality: N/A;    Family History  Problem Relation Age of Onset  . Anesthesia problems Daughter   . Cancer Mother   . Lung disease Father     & heart disease  .  Colon cancer Sister   . Sudden death Maternal Grandmother    Social History:  reports that he has quit smoking. His smoking use included Cigars. He quit smokeless tobacco use about 4 years ago. He reports that he drinks alcohol. He reports that he does not use illicit drugs.  Allergies: No Known Allergies   (Not in a hospital admission)  No results found for this or any previous visit (from the past 48 hour(s)). No results found.  Review of Systems  Constitutional: Negative.   HENT: Negative.   Eyes: Negative.   Respiratory:  Negative.   Cardiovascular: Negative.   Gastrointestinal: Negative.   Genitourinary: Negative.   Musculoskeletal: Positive for back pain.  Skin: Negative.   Neurological: Positive for sensory change and focal weakness.  Psychiatric/Behavioral: Negative.     There were no vitals taken for this visit. Physical Exam  Constitutional: He is oriented to person, place, and time. He appears well-developed and well-nourished.  HENT:  Head: Normocephalic and atraumatic.  Eyes: Conjunctivae and EOM are normal. Pupils are equal, round, and reactive to light.  Neck: Normal range of motion. Neck supple.  Cardiovascular: Normal rate and regular rhythm.   Respiratory: Effort normal and breath sounds normal.  GI: Soft. Bowel sounds are normal.  Musculoskeletal:  On examination, he is upright. He is in mild distress. Mood and affect are appropriate. He walks with an antalgic gait. He is unable to step up on his left side. He has hip flexor weakness and quad weakness. At this point in time, diminished reflex is noted.  He has EHL weakness as well. Decreased sensation in the L5 dermatome.  Lumbar spine exam reveals no evidence of soft tissue swelling, deformity or skin ecchymosis. On palpation there is no tenderness of the lumbar spine. No flank pain with percussion. The abdomen is soft and nontender. Nontender over the trochanters. No cellulitis or lymphadenopathy.  Good range of motion of the lumbar spine without associated pain. Straight leg raise is negative. Motor is 5/5 including tibialis anterior, plantar flexion, quadriceps and hamstrings. Patient is normoreflexic. There is no Babinski or clonus. Sensory exam is intact to light touch. Patient has good distal pulses. No DVT. No pain and normal range of motion without instability of the knees and ankles.  Neurological: He is alert and oriented to person, place, and time. He has normal reflexes.  Skin: Skin is warm and dry.  Psychiatric: He has a  normal mood and affect.    MRI demonstrates severe stenosis at 4-5 with a migrating disc herniation cephalad compressing the 4 root and 3 root.  Assessment/Plan Lumbar stenosis/HNP Myotomal weakness, dermatomal dysesthesias secondary to severe stenosis at 4-5 with a disc herniation, quad weakness, EHL weakness, hip flexor weakness.  Discussed at length his current condition. He has had progressive symptomatic neurogenic claudication secondary to spinal stenosis I feel is chronic and more acute L5 radiculopathy now with acute disc herniation in L3, L4. Given the neurologic deficit, I feel at this point in time it would be wise to consider decompression. He would like to proceed with that.  I had an extensive discussion of the risks and benefits of the lumbar decompression with the patient including bleeding, infection, damage to neurovascular structures, epidural fibrosis, CSF leak requiring repair. We also discussed increase in pain, adjacent segment disease, recurrent disc herniation, need for future surgery including repeat decompression and/or fusion. We also discussed risks of postoperative hematoma, paralysis, anesthetic complications including DVT, PE, death, cardiopulmonary dysfunction. In  addition, the perioperative and postoperative courses were discussed in detail including the rehabilitative time and return to functional activity and work. I provided the patient with an illustrated handout and utilized the appropriate surgical models.  Will obtain preoperative clearance. Will recommend decompression at 4-5 and at 3-4. We specifically discussed recovery and possible residual permanent nerve deficit. Preoperative clearance, he does have a history of a bypass. No recent chest pain or shortness of breath. Will schedule this within the week.  Plan microlumbar decompression L3-4, L4-5  Rebbeca Sheperd M. PA-C for Dr. Tonita Cong 09/05/2014, 7:19 PM

## 2014-09-06 ENCOUNTER — Telehealth: Payer: Self-pay | Admitting: *Deleted

## 2014-09-06 NOTE — Telephone Encounter (Signed)
Faxed clearance for spine: microlumbar decompression L4-5, L3-4  Cleared for surgery - not on beta-blocker or ACE-I due to history of hypotension.   LM for patient that he is cleared.

## 2014-09-06 NOTE — Patient Instructions (Addendum)
[name] [name]  09/06/2014   Your procedure is scheduled on: 09/12/14   Report to [facility] Main  Entrance and follow signs to               [place] at 9:00 AM.   Call this number if you have problems the morning of surgery [phone]   Remember:  Do not eat food or drink liquids :After Midnight.     Take these medicines the morning of surgery with A SIP OF WATER: NONE                               You may not have any metal on your body including hair pins and              piercings  Do not wear jewelry, make-up, lotions, powders or perfumes.             Do not wear nail polish.  Do not shave  48 hours prior to surgery.              Men may shave face and neck.   Do not bring valuables to the hospital. [place].  Contacts, dentures or bridgework may not be worn into surgery.  Leave suitcase in the car. After surgery it may be brought to your room.     Patients discharged the day of surgery will not be allowed to drive home.  Name and phone number of your driver:  Special Instructions: N/A              Please read over the following fact sheets you were given: _____________________________________________________________________                                                     [place]  Before surgery, you can play an important role.  Because skin is not sterile, your skin needs to be as free of germs as possible.  You can reduce the number of germs on your skin by washing with CHG (chlorahexidine gluconate) soap before surgery.  CHG is an antiseptic cleaner which kills germs and bonds with the skin to continue killing germs even after washing. Please DO NOT use if you have an allergy to CHG or antibacterial soaps.  If your skin becomes reddened/irritated stop using the CHG and inform your nurse when you arrive at Short Stay. Do not shave (including legs and  underarms) for at least 48 hours prior to the first CHG shower.  You may shave your face. Please follow these instructions carefully:   1.  Shower with CHG Soap the night before surgery and the  morning of Surgery.   2.  If you choose to wash your hair, wash your hair first as usual with your  normal  Shampoo.   3.  After you shampoo, rinse your hair and body thoroughly to remove the  shampoo.  4.  Use CHG as you would any other liquid soap.  You can apply chg directly  to the skin and wash . Gently wash with scrungie or clean wascloth    5.  Apply the CHG Soap to your body ONLY FROM THE NECK DOWN.   Do not use on open                           Wound or open sores. Avoid contact with eyes, ears mouth and genitals (private parts).                        Genitals (private parts) with your normal soap.              6.  Wash thoroughly, paying special attention to the area where your surgery  will be performed.   7.  Thoroughly rinse your body with warm water from the neck down.   8.  DO NOT shower/wash with your normal soap after using and rinsing off  the CHG Soap .                9.  Pat yourself dry with a clean towel.             10.  Wear clean pajamas.             11.  Place clean sheets on your bed the night of your first shower and do not  sleep with pets.  Day of Surgery : Do not apply any lotions/deodorants the morning of surgery.  Please wear clean clothes to the hospital/surgery center.  FAILURE TO FOLLOW THESE INSTRUCTIONS MAY RESULT IN THE CANCELLATION OF YOUR SURGERY    PATIENT SIGNATURE_________________________________  ______________________________________________________________________     Adam Phenix  An incentive spirometer is a tool that can help keep your lungs clear and active. This tool measures how well you are filling your lungs with each breath. Taking long deep breaths may help reverse or decrease  the chance of developing breathing (pulmonary) problems (especially infection) following:  A long period of time when you are unable to move or be active. BEFORE THE PROCEDURE   If the spirometer includes an indicator to show your best effort, your nurse or respiratory therapist will set it to a desired goal.  If possible, sit up straight or lean slightly forward. Try not to slouch.  Hold the incentive spirometer in an upright position. INSTRUCTIONS FOR USE  1. Sit on the edge of your bed if possible, or sit up as far as you can in bed or on a chair. 2. Hold the incentive spirometer in an upright position. 3. Breathe out normally. 4. Place the mouthpiece in your mouth and seal your lips tightly around it. 5. Breathe in slowly and as deeply as possible, raising the piston or the ball toward the top of the column. 6. Hold your breath for 3-5 seconds or for as long as possible. Allow the piston or ball to fall to the bottom of the column. 7. Remove the mouthpiece from your mouth and breathe out normally. 8. Rest for a few seconds and repeat Steps 1 through 7 at least 10 times every 1-2 hours when you are awake. Take your time and take a few normal breaths between deep breaths. 9. The spirometer may include an indicator to show your best effort. Use the indicator as a goal to work toward during  each repetition. 10. After each set of 10 deep breaths, practice coughing to be sure your lungs are clear. If you have an incision (the cut made at the time of surgery), support your incision when coughing by placing a pillow or rolled up towels firmly against it. Once you are able to get out of bed, walk around indoors and cough well. You may stop using the incentive spirometer when instructed by your caregiver.  RISKS AND COMPLICATIONS  Take your time so you do not get dizzy or light-headed.  If you are in pain, you may need to take or ask for pain medication before doing incentive spirometry. It is  harder to take a deep breath if you are having pain. AFTER USE  Rest and breathe slowly and easily.  It can be helpful to keep track of a log of your progress. Your caregiver can provide you with a simple table to help with this. If you are using the spirometer at home, follow these instructions: [place] IF:   You are having difficultly using the spirometer.  You have trouble using the spirometer as often as instructed.  Your pain medication is not giving enough relief while using the spirometer.  You develop fever of 100.5 F (38.1 C) or higher. SEEK IMMEDIATE MEDICAL CARE IF:   You cough up bloody sputum that had not been present before.  You develop fever of 102 F (38.9 C) or greater.  You develop worsening pain at or near the incision site. MAKE SURE YOU:   Understand these instructions.  Will watch your condition.  Will get help right away if you are not doing well or get worse. Document Released: 10/26/2006 Document Revised: 09/07/2011 Document Reviewed: 12/27/2006 [facility] Patient Information 2014 [place], [place].   ________________________________________________________________________

## 2014-09-07 ENCOUNTER — Encounter (HOSPITAL_COMMUNITY): Payer: Self-pay

## 2014-09-07 ENCOUNTER — Encounter (HOSPITAL_COMMUNITY)
Admission: RE | Admit: 2014-09-07 | Discharge: 2014-09-07 | Disposition: A | Discharge status: Home / Self Care | Payer: Medicare Other | Source: Ambulatory Visit | Attending: Specialist | Admitting: Specialist

## 2014-09-07 ENCOUNTER — Ambulatory Visit (HOSPITAL_COMMUNITY)
Admission: RE | Admit: 2014-09-07 | Discharge: 2014-09-07 | Disposition: A | Discharge status: Home / Self Care | Payer: Medicare Other | Source: Ambulatory Visit | Attending: Orthopedic Surgery | Admitting: Orthopedic Surgery

## 2014-09-07 DIAGNOSIS — G9589 Other specified diseases of spinal cord: Secondary | ICD-10-CM | POA: Insufficient documentation

## 2014-09-07 DIAGNOSIS — M47816 Spondylosis without myelopathy or radiculopathy, lumbar region: Secondary | ICD-10-CM | POA: Diagnosis not present

## 2014-09-07 DIAGNOSIS — M48061 Spinal stenosis, lumbar region without neurogenic claudication: Secondary | ICD-10-CM

## 2014-09-07 DIAGNOSIS — Z4889 Encounter for other specified surgical aftercare: Secondary | ICD-10-CM | POA: Diagnosis not present

## 2014-09-07 HISTORY — DX: Other intervertebral disc displacement, lumbar region: M51.26

## 2014-09-07 HISTORY — DX: Nocturia: R35.1

## 2014-09-07 HISTORY — DX: Frequency of micturition: R35.0

## 2014-09-07 HISTORY — DX: Spinal stenosis, lumbar region without neurogenic claudication: M48.061

## 2014-09-07 LAB — CBC
HEMATOCRIT: 39.1 % (ref 39.0–52.0)
Hemoglobin: 13.6 g/dL (ref 13.0–17.0)
MCH: 29.5 pg (ref 26.0–34.0)
MCHC: 34.8 g/dL (ref 30.0–36.0)
MCV: 84.8 fL (ref 78.0–100.0)
Platelets: 225 10*3/uL (ref 150–400)
RBC: 4.61 MIL/uL (ref 4.22–5.81)
RDW: 13.5 % (ref 11.5–15.5)
WBC: 7.8 10*3/uL (ref 4.0–10.5)

## 2014-09-07 LAB — BASIC METABOLIC PANEL
Anion gap: 8 (ref 5–15)
BUN: 21 mg/dL (ref 6–23)
CHLORIDE: 102 mmol/L (ref 96–112)
CO2: 25 mmol/L (ref 19–32)
Calcium: 9 mg/dL (ref 8.4–10.5)
Creatinine, Ser: 0.84 mg/dL (ref 0.50–1.35)
GFR, EST NON AFRICAN AMERICAN: 87 mL/min — AB (ref >90)
Glucose, Bld: 240 mg/dL — ABNORMAL HIGH (ref 70–99)
POTASSIUM: 4.8 mmol/L (ref 3.5–5.1)
Sodium: 135 mmol/L (ref 135–145)

## 2014-09-07 LAB — SURGICAL PCR SCREEN
MRSA, PCR: NEGATIVE
STAPHYLOCOCCUS AUREUS: NEGATIVE

## 2014-09-10 DIAGNOSIS — E1165 Type 2 diabetes mellitus with hyperglycemia: Secondary | ICD-10-CM | POA: Diagnosis not present

## 2014-09-10 DIAGNOSIS — I255 Ischemic cardiomyopathy: Secondary | ICD-10-CM | POA: Diagnosis not present

## 2014-09-10 DIAGNOSIS — I10 Essential (primary) hypertension: Secondary | ICD-10-CM | POA: Diagnosis not present

## 2014-09-10 DIAGNOSIS — I5022 Chronic systolic (congestive) heart failure: Secondary | ICD-10-CM | POA: Diagnosis not present

## 2014-09-10 DIAGNOSIS — N401 Enlarged prostate with lower urinary tract symptoms: Secondary | ICD-10-CM | POA: Diagnosis not present

## 2014-09-10 DIAGNOSIS — G4733 Obstructive sleep apnea (adult) (pediatric): Secondary | ICD-10-CM | POA: Diagnosis not present

## 2014-09-12 ENCOUNTER — Ambulatory Visit (HOSPITAL_COMMUNITY)
Admission: RE | Admit: 2014-09-12 | Discharge: 2014-09-13 | Disposition: A | Discharge status: Home / Self Care | Payer: Medicare Other | Source: Ambulatory Visit | Attending: Specialist | Admitting: Specialist

## 2014-09-12 ENCOUNTER — Encounter (HOSPITAL_COMMUNITY): Payer: Self-pay

## 2014-09-12 ENCOUNTER — Ambulatory Visit (HOSPITAL_COMMUNITY): Payer: Medicare Other

## 2014-09-12 ENCOUNTER — Ambulatory Visit (HOSPITAL_COMMUNITY): Payer: Medicare Other | Admitting: Anesthesiology

## 2014-09-12 ENCOUNTER — Encounter (HOSPITAL_COMMUNITY)
Admission: RE | Disposition: A | Discharge status: Home / Self Care | Payer: Self-pay | Source: Ambulatory Visit | Attending: Specialist

## 2014-09-12 DIAGNOSIS — Z87891 Personal history of nicotine dependence: Secondary | ICD-10-CM | POA: Diagnosis not present

## 2014-09-12 DIAGNOSIS — N400 Enlarged prostate without lower urinary tract symptoms: Secondary | ICD-10-CM | POA: Diagnosis not present

## 2014-09-12 DIAGNOSIS — G4730 Sleep apnea, unspecified: Secondary | ICD-10-CM | POA: Insufficient documentation

## 2014-09-12 DIAGNOSIS — M4806 Spinal stenosis, lumbar region: Secondary | ICD-10-CM | POA: Diagnosis not present

## 2014-09-12 DIAGNOSIS — M48061 Spinal stenosis, lumbar region without neurogenic claudication: Secondary | ICD-10-CM | POA: Diagnosis present

## 2014-09-12 DIAGNOSIS — I509 Heart failure, unspecified: Secondary | ICD-10-CM | POA: Diagnosis not present

## 2014-09-12 DIAGNOSIS — E119 Type 2 diabetes mellitus without complications: Secondary | ICD-10-CM | POA: Insufficient documentation

## 2014-09-12 DIAGNOSIS — N189 Chronic kidney disease, unspecified: Secondary | ICD-10-CM | POA: Diagnosis not present

## 2014-09-12 DIAGNOSIS — I255 Ischemic cardiomyopathy: Secondary | ICD-10-CM | POA: Diagnosis not present

## 2014-09-12 DIAGNOSIS — M5126 Other intervertebral disc displacement, lumbar region: Secondary | ICD-10-CM | POA: Diagnosis not present

## 2014-09-12 DIAGNOSIS — I129 Hypertensive chronic kidney disease with stage 1 through stage 4 chronic kidney disease, or unspecified chronic kidney disease: Secondary | ICD-10-CM | POA: Diagnosis not present

## 2014-09-12 DIAGNOSIS — E882 Lipomatosis, not elsewhere classified: Secondary | ICD-10-CM | POA: Insufficient documentation

## 2014-09-12 DIAGNOSIS — I4510 Unspecified right bundle-branch block: Secondary | ICD-10-CM | POA: Insufficient documentation

## 2014-09-12 DIAGNOSIS — I2510 Atherosclerotic heart disease of native coronary artery without angina pectoris: Secondary | ICD-10-CM | POA: Insufficient documentation

## 2014-09-12 DIAGNOSIS — Z9889 Other specified postprocedural states: Secondary | ICD-10-CM | POA: Diagnosis not present

## 2014-09-12 DIAGNOSIS — Z419 Encounter for procedure for purposes other than remedying health state, unspecified: Secondary | ICD-10-CM

## 2014-09-12 HISTORY — PX: LUMBAR LAMINECTOMY/DECOMPRESSION MICRODISCECTOMY: SHX5026

## 2014-09-12 LAB — GLUCOSE, CAPILLARY
GLUCOSE-CAPILLARY: 214 mg/dL — AB (ref 70–99)
GLUCOSE-CAPILLARY: 208 mg/dL — AB (ref 70–99)
GLUCOSE-CAPILLARY: 264 mg/dL — AB (ref 70–99)
Glucose-Capillary: 163 mg/dL — ABNORMAL HIGH (ref 70–99)

## 2014-09-12 SURGERY — LUMBAR LAMINECTOMY/DECOMPRESSION MICRODISCECTOMY 2 LEVELS
Anesthesia: General | Site: Back

## 2014-09-12 MED ORDER — LACTATED RINGERS IV SOLN: INTRAVENOUS | Status: DC

## 2014-09-12 MED ORDER — CALCIUM POLYCARBOPHIL 625 MG PO TABS
625.0000 mg | ORAL_TABLET | Freq: Every day | ORAL | Status: DC
  Administered 2014-09-12 – 2014-09-13 (×2): 625 mg via ORAL
  Filled 2014-09-12 (×2): qty 1

## 2014-09-12 MED ORDER — DOCUSATE SODIUM 100 MG PO CAPS
100.0000 mg | ORAL_CAPSULE | Freq: Two times a day (BID) | ORAL | Status: DC
  Administered 2014-09-12 – 2014-09-13 (×2): 100 mg via ORAL

## 2014-09-12 MED ORDER — BUPIVACAINE-EPINEPHRINE (PF) 0.5% -1:200000 IJ SOLN
INTRAMUSCULAR | Status: DC | PRN
  Administered 2014-09-12: 15 mL

## 2014-09-12 MED ORDER — ONDANSETRON HCL 4 MG/2ML IJ SOLN
INTRAMUSCULAR | Status: AC
  Filled 2014-09-12: qty 2

## 2014-09-12 MED ORDER — NEOSTIGMINE METHYLSULFATE 10 MG/10ML IV SOLN
INTRAVENOUS | Status: AC
  Filled 2014-09-12: qty 1

## 2014-09-12 MED ORDER — ALUM & MAG HYDROXIDE-SIMETH 200-200-20 MG/5ML PO SUSP: 30.0000 mL | Freq: Four times a day (QID) | ORAL | Status: DC | PRN

## 2014-09-12 MED ORDER — EPHEDRINE SULFATE 50 MG/ML IJ SOLN
INTRAMUSCULAR | Status: DC | PRN
  Administered 2014-09-12 (×2): 5 mg via INTRAVENOUS

## 2014-09-12 MED ORDER — LINAGLIPTIN 5 MG PO TABS
5.0000 mg | ORAL_TABLET | Freq: Every day | ORAL | Status: DC
  Administered 2014-09-13: 5 mg via ORAL
  Filled 2014-09-12: qty 1

## 2014-09-12 MED ORDER — ACETAMINOPHEN 325 MG PO TABS
650.0000 mg | ORAL_TABLET | ORAL | Status: DC | PRN
  Administered 2014-09-12 – 2014-09-13 (×2): 650 mg via ORAL
  Filled 2014-09-12 (×2): qty 2

## 2014-09-12 MED ORDER — DOCUSATE SODIUM 100 MG PO CAPS: 100.0000 mg | ORAL_CAPSULE | Freq: Two times a day (BID) | ORAL | Status: DC | PRN

## 2014-09-12 MED ORDER — PHENYLEPHRINE 40 MCG/ML (10ML) SYRINGE FOR IV PUSH (FOR BLOOD PRESSURE SUPPORT)
PREFILLED_SYRINGE | INTRAVENOUS | Status: AC
  Filled 2014-09-12: qty 10

## 2014-09-12 MED ORDER — MAGNESIUM HYDROXIDE 400 MG/5ML PO SUSP: 30.0000 mL | Freq: Every day | ORAL | Status: DC | PRN

## 2014-09-12 MED ORDER — FENTANYL CITRATE 0.05 MG/ML IJ SOLN
INTRAMUSCULAR | Status: AC
  Filled 2014-09-12: qty 5

## 2014-09-12 MED ORDER — GLYCOPYRROLATE 0.2 MG/ML IJ SOLN
INTRAMUSCULAR | Status: DC | PRN
  Administered 2014-09-12: 0.6 mg via INTRAVENOUS

## 2014-09-12 MED ORDER — THROMBIN 5000 UNITS EX SOLR
CUTANEOUS | Status: AC
  Filled 2014-09-12: qty 10000

## 2014-09-12 MED ORDER — SODIUM CHLORIDE 0.9 % IR SOLN
Status: AC
  Filled 2014-09-12: qty 1

## 2014-09-12 MED ORDER — SPIRONOLACTONE 12.5 MG HALF TABLET
12.5000 mg | ORAL_TABLET | Freq: Every day | ORAL | Status: DC
  Administered 2014-09-13: 12.5 mg via ORAL
  Filled 2014-09-12: qty 1

## 2014-09-12 MED ORDER — HYDROCODONE-ACETAMINOPHEN 5-325 MG PO TABS
1.0000 | ORAL_TABLET | ORAL | Status: DC | PRN
  Administered 2014-09-13 (×2): 1 via ORAL
  Filled 2014-09-12 (×2): qty 1

## 2014-09-12 MED ORDER — THROMBIN 5000 UNITS EX SOLR
CUTANEOUS | Status: DC | PRN
  Administered 2014-09-12: 10000 [IU] via TOPICAL

## 2014-09-12 MED ORDER — ACETAMINOPHEN 650 MG RE SUPP: 650.0000 mg | RECTAL | Status: DC | PRN

## 2014-09-12 MED ORDER — FENTANYL CITRATE 0.05 MG/ML IJ SOLN
INTRAMUSCULAR | Status: DC | PRN
  Administered 2014-09-12 (×2): 50 ug via INTRAVENOUS
  Administered 2014-09-12: 100 ug via INTRAVENOUS
  Administered 2014-09-12: 50 ug via INTRAVENOUS

## 2014-09-12 MED ORDER — FENTANYL CITRATE 0.05 MG/ML IJ SOLN: 25.0000 ug | INTRAMUSCULAR | Status: DC | PRN

## 2014-09-12 MED ORDER — ROCURONIUM BROMIDE 100 MG/10ML IV SOLN
INTRAVENOUS | Status: DC | PRN
  Administered 2014-09-12: 40 mg via INTRAVENOUS
  Administered 2014-09-12 (×2): 10 mg via INTRAVENOUS

## 2014-09-12 MED ORDER — PHENOL 1.4 % MT LIQD: 1.0000 | OROMUCOSAL | Status: DC | PRN

## 2014-09-12 MED ORDER — ONDANSETRON HCL 4 MG/2ML IJ SOLN: 4.0000 mg | INTRAMUSCULAR | Status: DC | PRN

## 2014-09-12 MED ORDER — LACTATED RINGERS IV SOLN
INTRAVENOUS | Status: DC | PRN
  Administered 2014-09-12 (×2): via INTRAVENOUS

## 2014-09-12 MED ORDER — PROMETHAZINE HCL 25 MG/ML IJ SOLN: 6.2500 mg | INTRAMUSCULAR | Status: DC | PRN

## 2014-09-12 MED ORDER — PROPOFOL 10 MG/ML IV BOLUS
INTRAVENOUS | Status: DC | PRN
  Administered 2014-09-12: 120 mg via INTRAVENOUS

## 2014-09-12 MED ORDER — LACTATED RINGERS IV SOLN
INTRAVENOUS | Status: DC
  Administered 2014-09-12: 1000 mL via INTRAVENOUS

## 2014-09-12 MED ORDER — SUCCINYLCHOLINE CHLORIDE 20 MG/ML IJ SOLN
INTRAMUSCULAR | Status: DC | PRN
  Administered 2014-09-12: 100 mg via INTRAVENOUS

## 2014-09-12 MED ORDER — MAGNESIUM CITRATE PO SOLN: 1.0000 | Freq: Once | ORAL | Status: AC | PRN

## 2014-09-12 MED ORDER — ROCURONIUM BROMIDE 100 MG/10ML IV SOLN
INTRAVENOUS | Status: AC
  Filled 2014-09-12: qty 1

## 2014-09-12 MED ORDER — INSULIN ASPART 100 UNIT/ML ~~LOC~~ SOLN
0.0000 [IU] | Freq: Three times a day (TID) | SUBCUTANEOUS | Status: DC
  Administered 2014-09-13 (×2): 8 [IU] via SUBCUTANEOUS

## 2014-09-12 MED ORDER — METHOCARBAMOL 1000 MG/10ML IJ SOLN
500.0000 mg | Freq: Four times a day (QID) | INTRAVENOUS | Status: DC | PRN
  Administered 2014-09-12: 500 mg via INTRAVENOUS
  Filled 2014-09-12 (×2): qty 5

## 2014-09-12 MED ORDER — CEFAZOLIN SODIUM-DEXTROSE 2-3 GM-% IV SOLR
2.0000 g | INTRAVENOUS | Status: AC
  Administered 2014-09-12: 2 g via INTRAVENOUS

## 2014-09-12 MED ORDER — CEFAZOLIN SODIUM-DEXTROSE 2-3 GM-% IV SOLR
INTRAVENOUS | Status: AC
  Filled 2014-09-12: qty 50

## 2014-09-12 MED ORDER — BUPIVACAINE-EPINEPHRINE (PF) 0.5% -1:200000 IJ SOLN
INTRAMUSCULAR | Status: AC
  Filled 2014-09-12: qty 30

## 2014-09-12 MED ORDER — ONDANSETRON HCL 4 MG/2ML IJ SOLN
INTRAMUSCULAR | Status: DC | PRN
  Administered 2014-09-12: 4 mg via INTRAVENOUS

## 2014-09-12 MED ORDER — HYDROMORPHONE HCL 1 MG/ML IJ SOLN: 0.5000 mg | INTRAMUSCULAR | Status: DC | PRN

## 2014-09-12 MED ORDER — GLYCOPYRROLATE 0.2 MG/ML IJ SOLN
INTRAMUSCULAR | Status: AC
  Filled 2014-09-12: qty 2

## 2014-09-12 MED ORDER — NEOSTIGMINE METHYLSULFATE 10 MG/10ML IV SOLN
INTRAVENOUS | Status: DC | PRN
  Administered 2014-09-12: 4 mg via INTRAVENOUS

## 2014-09-12 MED ORDER — PHENYLEPHRINE HCL 10 MG/ML IJ SOLN
INTRAMUSCULAR | Status: DC | PRN
  Administered 2014-09-12: 80 ug via INTRAVENOUS
  Administered 2014-09-12: 40 ug via INTRAVENOUS
  Administered 2014-09-12 (×5): 80 ug via INTRAVENOUS

## 2014-09-12 MED ORDER — LIDOCAINE HCL (CARDIAC) 20 MG/ML IV SOLN
INTRAVENOUS | Status: DC | PRN
  Administered 2014-09-12: 50 mg via INTRAVENOUS

## 2014-09-12 MED ORDER — KCL IN DEXTROSE-NACL 20-5-0.45 MEQ/L-%-% IV SOLN
INTRAVENOUS | Status: AC
  Administered 2014-09-12: 17:00:00 via INTRAVENOUS
  Filled 2014-09-12 (×2): qty 1000

## 2014-09-12 MED ORDER — SODIUM CHLORIDE 0.9 % IR SOLN
Status: DC | PRN
  Administered 2014-09-12: 500 mL

## 2014-09-12 MED ORDER — BISACODYL 5 MG PO TBEC: 5.0000 mg | DELAYED_RELEASE_TABLET | Freq: Every day | ORAL | Status: DC | PRN

## 2014-09-12 MED ORDER — OXYCODONE-ACETAMINOPHEN 5-325 MG PO TABS
1.0000 | ORAL_TABLET | ORAL | Status: DC | PRN
  Filled 2014-09-12: qty 1

## 2014-09-12 MED ORDER — MENTHOL 3 MG MT LOZG: 1.0000 | LOZENGE | OROMUCOSAL | Status: DC | PRN

## 2014-09-12 MED ORDER — CEFAZOLIN SODIUM-DEXTROSE 2-3 GM-% IV SOLR
2.0000 g | Freq: Three times a day (TID) | INTRAVENOUS | Status: AC
  Administered 2014-09-12 – 2014-09-13 (×3): 2 g via INTRAVENOUS
  Filled 2014-09-12 (×3): qty 50

## 2014-09-12 MED ORDER — OXYCODONE-ACETAMINOPHEN 5-325 MG PO TABS: 1.0000 | ORAL_TABLET | ORAL | Status: DC | PRN

## 2014-09-12 MED ORDER — METHOCARBAMOL 500 MG PO TABS
500.0000 mg | ORAL_TABLET | Freq: Four times a day (QID) | ORAL | Status: DC | PRN
  Administered 2014-09-13 (×3): 500 mg via ORAL
  Filled 2014-09-12 (×3): qty 1

## 2014-09-12 MED ORDER — PHENYLEPHRINE HCL 10 MG/ML IJ SOLN
INTRAMUSCULAR | Status: AC
  Filled 2014-09-12: qty 1

## 2014-09-12 MED ORDER — MEPERIDINE HCL 50 MG/ML IJ SOLN: 6.2500 mg | INTRAMUSCULAR | Status: DC | PRN

## 2014-09-12 MED ORDER — PROPOFOL 10 MG/ML IV BOLUS
INTRAVENOUS | Status: AC
  Filled 2014-09-12: qty 20

## 2014-09-12 SURGICAL SUPPLY — 45 items
BAG ZIPLOCK 12X15 (MISCELLANEOUS) IMPLANT
CHLORAPREP W/TINT 26ML (MISCELLANEOUS) IMPLANT
CLEANER TIP ELECTROSURG 2X2 (MISCELLANEOUS) ×3 IMPLANT
CLOSURE WOUND 1/2 X4 (GAUZE/BANDAGES/DRESSINGS) ×1
CLOTH 2% CHLOROHEXIDINE 3PK (PERSONAL CARE ITEMS) ×3 IMPLANT
DRAPE MICROSCOPE LEICA (MISCELLANEOUS) ×3 IMPLANT
DRAPE POUCH INSTRU U-SHP 10X18 (DRAPES) ×3 IMPLANT
DRAPE SURG 17X11 SM STRL (DRAPES) ×3 IMPLANT
DRAPE UTILITY XL STRL (DRAPES) ×3 IMPLANT
DRSG AQUACEL AG ADV 3.5X 4 (GAUZE/BANDAGES/DRESSINGS) IMPLANT
DRSG AQUACEL AG ADV 3.5X 6 (GAUZE/BANDAGES/DRESSINGS) ×3 IMPLANT
DURAPREP 26ML APPLICATOR (WOUND CARE) ×3 IMPLANT
DURASEAL SPINE SEALANT 3ML (MISCELLANEOUS) IMPLANT
ELECT BLADE TIP CTD 4 INCH (ELECTRODE) ×3 IMPLANT
ELECT REM PT RETURN 9FT ADLT (ELECTROSURGICAL) ×3
ELECTRODE REM PT RTRN 9FT ADLT (ELECTROSURGICAL) ×1 IMPLANT
GLOVE BIOGEL PI IND STRL 7.5 (GLOVE) ×1 IMPLANT
GLOVE BIOGEL PI INDICATOR 7.5 (GLOVE) ×2
GLOVE SURG SS PI 7.5 STRL IVOR (GLOVE) ×3 IMPLANT
GLOVE SURG SS PI 8.0 STRL IVOR (GLOVE) ×6 IMPLANT
GOWN STRL REUS W/TWL XL LVL3 (GOWN DISPOSABLE) ×6 IMPLANT
IV CATH 14GX2 1/4 (CATHETERS) ×3 IMPLANT
KIT BASIN OR (CUSTOM PROCEDURE TRAY) ×3 IMPLANT
KIT POSITIONING SURG ANDREWS (MISCELLANEOUS) ×3 IMPLANT
MANIFOLD NEPTUNE II (INSTRUMENTS) ×3 IMPLANT
NEEDLE SPNL 18GX3.5 QUINCKE PK (NEEDLE) ×6 IMPLANT
PACK LAMINECTOMY ORTHO (CUSTOM PROCEDURE TRAY) ×3 IMPLANT
PATTIES SURGICAL .5 X.5 (GAUZE/BANDAGES/DRESSINGS) IMPLANT
PATTIES SURGICAL .75X.75 (GAUZE/BANDAGES/DRESSINGS) IMPLANT
PATTIES SURGICAL 1X1 (DISPOSABLE) IMPLANT
SPONGE LAP 4X18 X RAY DECT (DISPOSABLE) ×3 IMPLANT
SPONGE SURGIFOAM ABS GEL 100 (HEMOSTASIS) ×3 IMPLANT
STRIP CLOSURE SKIN 1/2X4 (GAUZE/BANDAGES/DRESSINGS) ×2 IMPLANT
SUT NURALON 4 0 TR CR/8 (SUTURE) IMPLANT
SUT PROLENE 3 0 PS 2 (SUTURE) IMPLANT
SUT VIC AB 1 CT1 27 (SUTURE) ×4
SUT VIC AB 1 CT1 27XBRD ANTBC (SUTURE) ×2 IMPLANT
SUT VIC AB 1-0 CT2 27 (SUTURE) IMPLANT
SUT VIC AB 2-0 CT1 27 (SUTURE) ×4
SUT VIC AB 2-0 CT1 TAPERPNT 27 (SUTURE) ×2 IMPLANT
SUT VIC AB 2-0 CT2 27 (SUTURE) IMPLANT
SYR 3ML LL SCALE MARK (SYRINGE) ×3 IMPLANT
TOWEL OR 17X26 10 PK STRL BLUE (TOWEL DISPOSABLE) ×3 IMPLANT
TOWEL OR NON WOVEN STRL DISP B (DISPOSABLE) ×3 IMPLANT
YANKAUER SUCT BULB TIP NO VENT (SUCTIONS) IMPLANT

## 2014-09-12 NOTE — Anesthesia Preprocedure Evaluation (Signed)
Anesthesia Evaluation  Patient identified by MRN, date of birth, ID band Patient awake    Reviewed: Allergy & Precautions, NPO status , Patient's Chart, lab work & pertinent test results  Airway Mallampati: II  TM Distance: >3 FB Neck ROM: Full    Dental no notable dental hx.    Pulmonary sleep apnea , former smoker,  breath sounds clear to auscultation  Pulmonary exam normal       Cardiovascular hypertension, + CAD and + CABG (2013) Rhythm:Regular Rate:Normal  RBBB   Neuro/Psych negative neurological ROS  negative psych ROS   GI/Hepatic negative GI ROS, Neg liver ROS,   Endo/Other  diabetes, Type 2  Renal/GU negative Renal ROS  negative genitourinary   Musculoskeletal negative musculoskeletal ROS (+)   Abdominal   Peds negative pediatric ROS (+)  Hematology negative hematology ROS (+)   Anesthesia Other Findings   Reproductive/Obstetrics negative OB ROS                             Anesthesia Physical Anesthesia Plan  ASA: III  Anesthesia Plan: General   Post-op Pain Management:    Induction: Intravenous  Airway Management Planned: Oral ETT  Additional Equipment:   Intra-op Plan:   Post-operative Plan: Extubation in OR  Informed Consent: I have reviewed the patients History and Physical, chart, labs and discussed the procedure including the risks, benefits and alternatives for the proposed anesthesia with the patient or authorized representative who has indicated his/her understanding and acceptance.   Dental advisory given  Plan Discussed with: CRNA  Anesthesia Plan Comments:         Anesthesia Quick Evaluation

## 2014-09-12 NOTE — H&P (View-Only) (Signed)
[name] [name] is an 69 y.o. male.   Chief Complaint: back and left leg pain HPI: The patient is a 69 year old male who presents today for follow up of their back. The patient is being followed for their back pain. Symptoms reported today include: pain, numbness (lateral aspect of his calf) and leg pain (numbness and some tingling but not really pain.). The patient states that they are doing well. Current treatment includes: activity modification. The following medication has been used for pain control: antiinflammatory medication (Aleve, helps some). The patient presents today following MRI.  [name] [name] follows up. He is here with his daughter. He has had less pain, but fairly weak in the leg and difficulty walking. I called him this morning with the result of his MRI. His MRI does show that he has a large disk herniation that is extruded at 4-5 migrating in cephalad behind the body of 4.   Past Medical History  Diagnosis Date  . Diabetes mellitus   . Coronary artery disease   . High cholesterol   . Hypertension   . Stroke, Wallenberg's syndrome   . Ischemic cardiomyopathy   . Sleep apnea     sleep study 10/2010- AHI during total sleep 32.1/hr and during REM 62.3/hr (severe sleep apnea)  . CAD (coronary artery disease) 07/06/2011  . LV dysfunction 07/06/2011  . Stroke     Wallenberg   . CHF (congestive heart failure)   . Chronic kidney disease     enlarged prostate  . Arthritis     Knee L - probably  . 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 Ellison Hughs  . BPH (benign prostatic hyperplasia)   . History of nuclear stress test 09/2010    dipyridamole; mild perfusion defect due to attenuation with mild superimposed ischemia in apical septal, apical, apical inferior & apical lateral regions; rest LV enlarged in size; prominent gut uptake in infero-apical region; no significant ischemia demonstrated; low risk  scan   . Right bundle branch block     Past Surgical History  Procedure Laterality Date  . Cardiac catheterization  01/2011    ischemic cardiomyopathy 30-35%, multivessel CAD (Dr. [name])   . Pilonidal cyst removed    . Colonscopy    . Coronary artery bypass graft  08/03/2011    Procedure: CORONARY ARTERY BYPASS GRAFTING (CABG);  Surgeon: [name], MD;  Location: [facility];  Service: Open Heart Surgery;  Laterality: N/A;  CABG times three using right saphenous vein and left mammary artery usisng endoscope.  . Transthoracic echocardiogram  11/10/2012    EF 40-45%, mild LVH, mild hypokinesis of anteroseptal myocardium, grade 1 diastolic dysfunction; mild MR & calcifed mitral annulus; LA mild-mod dilated; RA mildly dilated  . Lower extremity arterial doppler  03/16/2013    bilat ABIs demonstated normal values; R runoff - posterior tibial & anterior tibial arteries occluded; L runoff - peroneal & posterior tibial arteries occluded, anterior tibial artery appears occluded  . Left heart catheterization with coronary/graft angiogram N/A 09/20/2013    Procedure: LEFT HEART CATHETERIZATION WITH [name];  Surgeon: [name], MD;  Location: [facility] CATH LAB;  Service: Cardiovascular;  Laterality: N/A;    Family History  Problem Relation Age of Onset  . Anesthesia problems Daughter   . Cancer Mother   . Lung disease Father     & heart disease  .  Colon cancer Sister   . Sudden death Maternal Grandmother    Social History:  reports that he has quit smoking. His smoking use included Cigars. He quit smokeless tobacco use about 4 years ago. He reports that he drinks alcohol. He reports that he does not use illicit drugs.  Allergies: No Known Allergies   (Not in a hospital admission)  No results found for this or any previous visit (from the past 48 hour(s)). No results found.  Review of Systems  Constitutional: Negative.   HENT: Negative.   Eyes: Negative.   Respiratory:  Negative.   Cardiovascular: Negative.   Gastrointestinal: Negative.   Genitourinary: Negative.   Musculoskeletal: Positive for back pain.  Skin: Negative.   Neurological: Positive for sensory change and focal weakness.  Psychiatric/Behavioral: Negative.     There were no vitals taken for this visit. Physical Exam  Constitutional: He is oriented to person, place, and time. He appears well-developed and well-nourished.  HENT:  Head: Normocephalic and atraumatic.  Eyes: Conjunctivae and EOM are normal. Pupils are equal, round, and reactive to light.  Neck: Normal range of motion. Neck supple.  Cardiovascular: Normal rate and regular rhythm.   Respiratory: Effort normal and breath sounds normal.  GI: Soft. Bowel sounds are normal.  Musculoskeletal:  On examination, he is upright. He is in mild distress. Mood and affect are appropriate. He walks with an antalgic gait. He is unable to step up on his left side. He has hip flexor weakness and quad weakness. At this point in time, diminished reflex is noted.  He has EHL weakness as well. Decreased sensation in the L5 dermatome.  Lumbar spine exam reveals no evidence of soft tissue swelling, deformity or skin ecchymosis. On palpation there is no tenderness of the lumbar spine. No flank pain with percussion. The abdomen is soft and nontender. Nontender over the trochanters. No cellulitis or lymphadenopathy.  Good range of motion of the lumbar spine without associated pain. Straight leg raise is negative. Motor is 5/5 including tibialis anterior, plantar flexion, quadriceps and hamstrings. Patient is normoreflexic. There is no Babinski or clonus. Sensory exam is intact to light touch. Patient has good distal pulses. No DVT. No pain and normal range of motion without instability of the knees and ankles.  Neurological: He is alert and oriented to person, place, and time. He has normal reflexes.  Skin: Skin is warm and dry.  Psychiatric: He has a  normal mood and affect.    MRI demonstrates severe stenosis at 4-5 with a migrating disc herniation cephalad compressing the 4 root and 3 root.  Assessment/Plan Lumbar stenosis/HNP Myotomal weakness, dermatomal dysesthesias secondary to severe stenosis at 4-5 with a disc herniation, quad weakness, EHL weakness, hip flexor weakness.  Discussed at length his current condition. He has had progressive symptomatic neurogenic claudication secondary to spinal stenosis I feel is chronic and more acute L5 radiculopathy now with acute disc herniation in L3, L4. Given the neurologic deficit, I feel at this point in time it would be wise to consider decompression. He would like to proceed with that.  I had an extensive discussion of the risks and benefits of the lumbar decompression with the patient including bleeding, infection, damage to neurovascular structures, epidural fibrosis, CSF leak requiring repair. We also discussed increase in pain, adjacent segment disease, recurrent disc herniation, need for future surgery including repeat decompression and/or fusion. We also discussed risks of postoperative hematoma, paralysis, anesthetic complications including DVT, PE, death, cardiopulmonary dysfunction. In  addition, the perioperative and postoperative courses were discussed in detail including the rehabilitative time and return to functional activity and work. I provided the patient with an illustrated handout and utilized the appropriate surgical models.  Will obtain preoperative clearance. Will recommend decompression at 4-5 and at 3-4. We specifically discussed recovery and possible residual permanent nerve deficit. Preoperative clearance, he does have a history of a bypass. No recent chest pain or shortness of breath. Will schedule this within the week.  Plan microlumbar decompression L3-4, L4-5  BISSELL, JACLYN M. PA-C for Dr. Tonita Cong 09/05/2014, 7:19 PM

## 2014-09-12 NOTE — Discharge Instructions (Signed)

## 2014-09-12 NOTE — Anesthesia Postprocedure Evaluation (Signed)
  Anesthesia Post-op Note  Patient: [name] [name]  Procedure(s) Performed: Procedure(s) (LRB): LUMBAR DECOMPRESSION L3-L4, L4-L5, MICRODISCECTOMY L4-L5 (N/A)  Patient Location: PACU  Anesthesia Type: General  Level of Consciousness: awake and alert   Airway and Oxygen Therapy: Patient Spontanous Breathing  Post-op Pain: mild  Post-op Assessment: Post-op Vital signs reviewed, Patient's Cardiovascular Status Stable, Respiratory Function Stable, Patent Airway and No signs of Nausea or vomiting  Last Vitals:  Filed Vitals:   09/12/14 1430  BP: 150/70  Pulse: 73  Temp:   Resp: 7    Post-op Vital Signs: stable   Complications: No apparent anesthesia complications

## 2014-09-12 NOTE — Brief Op Note (Signed)
09/12/2014  12:51 PM  PATIENT:  [name] [name]  69 y.o. male  PRE-OPERATIVE DIAGNOSIS:  stenosis and HNP L3-L4, L4-L5  POST-OPERATIVE DIAGNOSIS:  stenosis and HNP L3-L4, L4-L5  PROCEDURE:  Procedure(s): LUMBAR DECOMPRESSION L3-L4, L4-L5, MICRODISCECTOMY L4-L5 (N/A)  SURGEON:  Surgeon(s) and Role:    * [name], MD - Primary  PHYSICIAN ASSISTANT:   ASSISTANTS: Bissel   ANESTHESIA:   general  EBL:  Total I/O In: 1500 [I.V.:1500] Out: 375 [Urine:325; Blood:50]  BLOOD ADMINISTERED:none  DRAINS: none   LOCAL MEDICATIONS USED:  MARCAINE     SPECIMEN:  No Specimen  DISPOSITION OF SPECIMEN:  PATHOLOGY  COUNTS:  YES  TOURNIQUET:  * No tourniquets in log *  DICTATION: .Other Dictation: Dictation Number [id number]  PLAN OF CARE: Admit for overnight observation  PATIENT DISPOSITION:  PACU - hemodynamically stable.   Delay start of Pharmacological VTE agent (>24hrs) due to surgical blood loss or risk of bleeding: yes

## 2014-09-12 NOTE — Anesthesia Procedure Notes (Signed)
Procedure Name: Intubation Date/Time: 09/12/2014 11:00 AM Performed by: Brynlea Spindler, Virgel Gess Pre-anesthesia Checklist: Patient identified, Emergency Drugs available, Suction available, Patient being monitored and Timeout performed Patient Re-evaluated:Patient Re-evaluated prior to inductionOxygen Delivery Method: Circle system utilized Preoxygenation: Pre-oxygenation with 100% oxygen Intubation Type: IV induction Ventilation: Mask ventilation without difficulty Laryngoscope Size: Mac and 4 Grade View: Grade I Tube type: Oral Tube size: 7.5 mm Number of attempts: 1 Airway Equipment and Method: Stylet Placement Confirmation: ETT inserted through vocal cords under direct vision,  positive ETCO2,  CO2 detector and breath sounds checked- equal and bilateral Secured at: 23 cm Tube secured with: Tape Dental Injury: Teeth and Oropharynx as per pre-operative assessment

## 2014-09-12 NOTE — Interval H&P Note (Signed)
History and Physical Interval Note:  09/12/2014 8:22 AM  [name] [name]  has presented today for surgery, with the diagnosis of stenosis and HNP  The various methods of treatment have been discussed with the patient and family. After consideration of risks, benefits and other options for treatment, the patient has consented to  Procedure(s): MICRO LUMBAR DECOMPRESSION L3 - L4 and L4 - L5  2 LEVELS (N/A) as a surgical intervention .  The patient's history has been reviewed, patient examined, no change in status, stable for surgery.  I have reviewed the patient's chart and labs.  Questions were answered to the patient's satisfaction.     Mathis Cashman C

## 2014-09-12 NOTE — Transfer of Care (Signed)
Immediate Anesthesia Transfer of Care Note  Patient: [name] [name]  Procedure(s) Performed: Procedure(s) (LRB): LUMBAR DECOMPRESSION L3-L4, L4-L5, MICRODISCECTOMY L4-L5 (N/A)  Patient Location: PACU  Anesthesia Type: General  Level of Consciousness: sedated, patient cooperative and responds to stimulation  Airway & Oxygen Therapy: Patient Spontanous Breathing and Patient connected to face mask oxgen  Post-op Assessment: Report given to PACU RN and Post -op Vital signs reviewed and stable  Post vital signs: Reviewed and stable  Complications: No apparent anesthesia complications

## 2014-09-13 ENCOUNTER — Encounter (HOSPITAL_COMMUNITY): Payer: Self-pay | Admitting: Specialist

## 2014-09-13 DIAGNOSIS — I129 Hypertensive chronic kidney disease with stage 1 through stage 4 chronic kidney disease, or unspecified chronic kidney disease: Secondary | ICD-10-CM | POA: Diagnosis not present

## 2014-09-13 DIAGNOSIS — M5126 Other intervertebral disc displacement, lumbar region: Secondary | ICD-10-CM | POA: Diagnosis not present

## 2014-09-13 DIAGNOSIS — E882 Lipomatosis, not elsewhere classified: Secondary | ICD-10-CM | POA: Diagnosis not present

## 2014-09-13 DIAGNOSIS — N189 Chronic kidney disease, unspecified: Secondary | ICD-10-CM | POA: Diagnosis not present

## 2014-09-13 DIAGNOSIS — M4806 Spinal stenosis, lumbar region: Secondary | ICD-10-CM | POA: Diagnosis not present

## 2014-09-13 DIAGNOSIS — E119 Type 2 diabetes mellitus without complications: Secondary | ICD-10-CM | POA: Diagnosis not present

## 2014-09-13 LAB — BASIC METABOLIC PANEL
Anion gap: 9 (ref 5–15)
BUN: 11 mg/dL (ref 6–23)
CO2: 25 mmol/L (ref 19–32)
Calcium: 8.2 mg/dL — ABNORMAL LOW (ref 8.4–10.5)
Chloride: 103 mmol/L (ref 96–112)
Creatinine, Ser: 0.83 mg/dL (ref 0.50–1.35)
GFR calc Af Amer: >90 mL/min (ref >90)
GFR calc non Af Amer: 87 mL/min — ABNORMAL LOW (ref >90)
GLUCOSE: 265 mg/dL — AB (ref 70–99)
Potassium: 4.3 mmol/L (ref 3.5–5.1)
Sodium: 137 mmol/L (ref 135–145)

## 2014-09-13 LAB — CBC
HCT: 35.6 % — ABNORMAL LOW (ref 39.0–52.0)
Hemoglobin: 12.3 g/dL — ABNORMAL LOW (ref 13.0–17.0)
MCH: 29.2 pg (ref 26.0–34.0)
MCHC: 34.6 g/dL (ref 30.0–36.0)
MCV: 84.6 fL (ref 78.0–100.0)
Platelets: 193 10*3/uL (ref 150–400)
RBC: 4.21 MIL/uL — ABNORMAL LOW (ref 4.22–5.81)
RDW: 13.5 % (ref 11.5–15.5)
WBC: 7.8 10*3/uL (ref 4.0–10.5)

## 2014-09-13 LAB — GLUCOSE, CAPILLARY
GLUCOSE-CAPILLARY: 266 mg/dL — AB (ref 70–99)
GLUCOSE-CAPILLARY: 253 mg/dL — AB (ref 70–99)
GLUCOSE-CAPILLARY: 258 mg/dL — AB (ref 70–99)

## 2014-09-13 MED ORDER — GLIPIZIDE ER 10 MG PO TB24
10.0000 mg | ORAL_TABLET | Freq: Every day | ORAL | Status: DC
  Administered 2014-09-13: 10 mg via ORAL
  Filled 2014-09-13 (×2): qty 1

## 2014-09-13 MED ORDER — METFORMIN HCL ER 500 MG PO TB24
500.0000 mg | ORAL_TABLET | Freq: Every day | ORAL | Status: DC
  Administered 2014-09-13: 500 mg via ORAL
  Filled 2014-09-13 (×2): qty 1

## 2014-09-13 MED ORDER — IBUPROFEN 200 MG PO TABS: 400.0000 mg | ORAL_TABLET | Freq: Every day | ORAL | Status: DC | PRN

## 2014-09-13 MED ORDER — ASPIRIN 325 MG PO TABS: 325.0000 mg | ORAL_TABLET | Freq: Every day | ORAL | Status: DC

## 2014-09-13 MED ORDER — HYDROCODONE-ACETAMINOPHEN 5-325 MG PO TABS: 1.0000 | ORAL_TABLET | Freq: Four times a day (QID) | ORAL | Status: DC | PRN

## 2014-09-13 MED ORDER — NAPROXEN SODIUM 220 MG PO TABS: 220.0000 mg | ORAL_TABLET | Freq: Two times a day (BID) | ORAL | Status: DC

## 2014-09-13 NOTE — Evaluation (Signed)
Physical Therapy Evaluation Patient Details Name: [name] [name] MRN: [id number] DOB: 1945-09-23 Today's Date: 09/13/2014   History of Present Illness  Pt is s/p LUMBAR DECOMPRESSION L3-L4, L4-L5, MICRODISCECTOMY L4-L5  Clinical Impression  Pt is independent with mobility using RW. He walked 76' in hall without difficulty and with minimal 2/10 back pain. No buckling of LLE noted. He is ready to DC home from PT standpoint.     Follow Up Recommendations      Equipment Recommendations       Recommendations for Other Services       Precautions / Restrictions Precautions Precautions: Back Precaution Booklet Issued: Yes (comment) Precaution Comments: Issued back care handout and reviewed all back precautions.      Mobility  Bed Mobility Overal bed mobility: Needs Assistance Bed Mobility: Rolling;Sidelying to Sit Rolling: Min guard Sidelying to sit: Min guard       General bed mobility comments: verbal cues for log roll technique.   Transfers Overall transfer level: Modified independent Equipment used: Rolling walker (2 wheeled) Transfers: Sit to/from Stand Sit to Stand: Modified independent (Device/Increase time)         General transfer comment: cues for hand placement  Ambulation/Gait Ambulation/Gait assistance: Modified independent (Device/Increase time) Ambulation Distance (Feet): 370 Feet Assistive device: Rolling walker (2 wheeled) Gait Pattern/deviations: WFL(Within Functional Limits)   Gait velocity interpretation: at or above normal speed for age/gender General Gait Details: steady with RW, no buckling of LLE, good adherence to precautions  Stairs            Wheelchair Mobility    Modified Rankin (Stroke Patients Only)       Balance Overall balance assessment: Modified Independent                                           Pertinent Vitals/Pain Pain Assessment: 0-10 Pain Score: 2  Pain Location: back, with  walking Pain Descriptors / Indicators: Sore Pain Intervention(s): Monitored during session    Home Living Family/patient expects to be discharged to:: Private residence Living Arrangements: Spouse/significant other Available Help at Discharge: Family;Available 24 hours/day Type of Home: House Home Access: Level entry     Home Layout: Able to live on main level with bedroom/bathroom Home Equipment: Walker - 2 wheels      Prior Function Level of Independence: Independent               Hand Dominance        Extremity/Trunk Assessment   Upper Extremity Assessment: Overall WFL for tasks assessed           Lower Extremity Assessment: LLE deficits/detail   LLE Deficits / Details: +4/5 L ankle, knee, hip; sensation intact to light touch  Cervical / Trunk Assessment: Normal  Communication   Communication: No difficulties  Cognition Arousal/Alertness: Awake/alert Behavior During Therapy: WFL for tasks assessed/performed Overall Cognitive Status: Within Functional Limits for tasks assessed                      General Comments      Exercises        Assessment/Plan    PT Assessment    PT Diagnosis     PT Problem List    PT Treatment Interventions     PT Goals (Current goals can be found in the Care Plan section) Acute Rehab PT Goals 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 PT Goal Formulation: All assessment and education complete, DC therapy    Frequency     Barriers to discharge        Co-evaluation               End of Session            Functional Assessment Tool Used: clinical judgement Functional Limitation: Mobility: Walking and moving around Mobility: Walking and Moving Around Current Status [phone]): At least 1 percent but less than 20 percent impaired, limited or restricted Mobility: Walking and Moving Around Goal Status [phone]): At least 1 percent but less than 20 percent impaired, limited or  restricted Mobility: Walking and Moving Around Discharge Status [phone]): At least 1 percent but less than 20 percent impaired, limited or restricted    Time: 3729-0211 PT Time Calculation (min) (ACUTE ONLY): 19 min   Charges:   PT Evaluation $Initial PT Evaluation Tier I: 1 Procedure     PT G Codes:   PT G-Codes **NOT FOR INPATIENT CLASS** Functional Assessment Tool Used: clinical judgement Functional Limitation: Mobility: Walking and moving around Mobility: Walking and Moving Around Current Status (D5520): At least 1 percent but less than 20 percent impaired, limited or restricted Mobility: Walking and Moving Around Goal Status [phone]): At least 1 percent but less than 20 percent impaired, limited or restricted Mobility: Walking and Moving Around Discharge Status [phone]): At least 1 percent but less than 20 percent impaired, limited or restricted    Philomena Doheny 09/13/2014, 11:35 AM [phone]

## 2014-09-13 NOTE — Care Management Note (Signed)
    Page 1 of 1   09/13/2014     2:03:09 PM CARE MANAGEMENT NOTE 09/13/2014  Patient:  [name] [name], [name] [name]   Account Number:  [id number]  Date Initiated:  09/13/2014  Documentation initiated by:  [facility]  Subjective/Objective Assessment:   OUTPT/LUMBAR DECOMPRESSION L3-L4, L4-L5, MICRODISCECTOMY L4-L5     Action/Plan:   discharge planning   Anticipated DC Date:  09/13/2014   Anticipated DC Plan:  [place]  CM consult      Choice offered to / List presented to:     DME arranged  3-N-1      DME agency  [facility].        Status of service:  Completed, signed off Medicare Important Message given?   (If response is "NO", the following Medicare IM given date fields will be blank) Date Medicare IM given:   Medicare IM given by:   Date Additional Medicare IM given:   Additional Medicare IM given by:    Discharge Disposition:  HOME/SELF CARE  Per UR Regulation:  Reviewed for med. necessity/level of care/duration of stay  If discussed at [place] of Stay Meetings, dates discussed:    Comments:  09/13/14 14:00 Cm called [facility] DME rep, Lecretia to please deliver the 3n1 to room prior to pt discharge.  No PT/OT fu.  No other CM needs were communicated.  Mariane Masters, BSN, CM [phone].

## 2014-09-13 NOTE — Progress Notes (Addendum)
Occupational Therapy Treatment Patient Details Name: [name] [name] MRN: [id number] DOB: Apr 10, 1946 Today's Date: 09/13/2014    History of present illness Pt is s/p LUMBAR DECOMPRESSION L3-L4, L4-L5, MICRODISCECTOMY L4-L5   OT comments  Pt tolerated second OT session well. Able to complete sponge bath, dressing, shower transfer and AE practice all with good tolerance. Feel pt is ok to d/c today from OT standpoint. Pt will need toilet aid in order to perform toilet hygiene and adhere to back precautions. Wife purchased from gift shop as well as the [employer]. Exchanged out reacher and shoe horn for extra long of each item.    Follow Up Recommendations  No OT follow up;Supervision/Assistance - 24 hour    Equipment Recommendations  3 in 1 bedside comode    Recommendations for Other Services      Precautions / Restrictions Precautions Precautions: Back Precaution Booklet Issued: Yes (comment) Precaution Comments: reviewed back precautions/reinforced with ADL.       Mobility Bed Mobility           Transfers Overall transfer level: Needs assistance Equipment used: Rolling walker (2 wheeled) Transfers: Sit to/from Stand Sit to Stand: Min guard;Supervision         General transfer comment: cues for back precautions.    Balance                                  ADL Overall ADL's : Needs assistance/impaired      Upper Body Bathing: pt performed.   Lower body bathing; mod assist for lower legs and posterior periarea (pt plans to obtain AE)  Upper Body Dressing : pt donned shirt.   Lower Body Dressing: Minimal assistance;Cueing for back precautions;Sit to/from stand;With adaptive equipment Lower Body Dressing Details (indicate cue type and reason): min assist to help with sock as it didnt come fully onto foot using sock aid. min assist to pull pants up in the back in standing. Toilet Transfer: Min guard;Ambulation;BSC;RW      Tub/ Shower Transfer:  Walk-in shower;Min Engineer, drilling Details (indicate cue type and reason): cues for side stepping into shower and placement of feet as he steps in to avoid "tangling up" his feet.    General ADL Comments: Wife present for session. Educated and practiced with AE for LB self care. Wife purchased AE kit. Exchanged out long shoe horn and reacher for the extra long of each item. Educated on toilet aid option also and wife purchased. Demonstrated to pt how to use and he verbalized understanding. pt did well with AE and feel he will improve with practice. Wife available to assist and verbalizes understanding of AE also. Pt feeling much better this session and no complaint of dizziness/weakness feeling. Educated on how to adjust 3in1 to appropriate height and use as a shower chair. Advised to use 3in1 as shower chair initially for safety. Wife assisted pt in washing periarea in standing at the start of the session until long handle sponge purchased.       Vision                     Perception     Praxis      Cognition   Behavior During Therapy: [facility] for tasks assessed/performed Overall Cognitive Status: Within Functional Limits for tasks assessed  Extremity/Trunk Assessment  Upper Extremity Assessment Upper Extremity Assessment: Overall WFL for tasks assessed      Cervical / Trunk Assessment Cervical / Trunk Assessment: Normal    Exercises     Shoulder Instructions       General Comments      Pertinent Vitals/ Pain       Pain Assessment: 0-10 Pain Score: 2  Pain Location: back Pain Descriptors / Indicators: Sore Pain Intervention(s): Repositioned  Home Living Family/patient expects to be discharged to:: Private residence Living Arrangements: Spouse/significant other Available Help at Discharge: Family;Available 24 hours/day Type of Home: House Home Access: Level entry     Home Layout: Able to live on main level  with bedroom/bathroom     Bathroom Shower/Tub: Occupational psychologist: Standard     Home Equipment: Environmental consultant - 2 wheels          Prior Functioning/Environment Level of Independence: Independent            Frequency Min 2X/week     Progress Toward Goals  OT Goals(current goals can now be found in the care plan section)  Progress towards OT goals: Progressing toward goals  Acute Rehab OT Goals 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   Plan Discharge plan remains appropriate    Co-evaluation                 End of Session Equipment Utilized During Treatment: Rolling walker   Activity Tolerance Patient tolerated treatment well   Patient Left in chair;with call bell/phone within reach   Nurse Communication       Time: 1132-1212 OT Time Calculation (min): 40 min  Charges:   OT General Charges $OT Visit: 1 Procedure  OT Treatments $Self Care/Home Management : 23-37 mins $Therapeutic Activity: 8-22 mins  Jules Schick  [phone] 09/13/2014, 12:24 PM

## 2014-09-13 NOTE — Evaluation (Signed)
Occupational Therapy Evaluation Patient Details Name: [name] [name] MRN: [id number] DOB: [birth year] Today's Date: 09/13/2014    History of Present Illness Pt is s/p LUMBAR DECOMPRESSION L3-L4, L4-L5, MICRODISCECTOMY L4-L5   Clinical Impression   Pt up to the 3in1 to practice toilet transfers. Pt becoming queasy sitting EOB and sat back down for several minutes. Pt stood again and stated he felt better but when he transferred on and off 3in1, he started to feel queasy, warm and lightheaded. BP sitting 106/43 on the commode. Assisted pt to the recliner and BP taken again after several minutes and was 97/50. Nursing in room and aware. Was not able to complete ADL education this visit due to pt not feeling well. Will need to return to finish ADL education and progress safety and independence.     Follow Up Recommendations  No OT follow up;Supervision/Assistance - 24 hour    Equipment Recommendations  3 in 1 bedside comode    Recommendations for Other Services       Precautions / Restrictions Precautions Precautions: Back Precaution Booklet Issued: Yes (comment) Precaution Comments: Issued back care handout and reviewed all back precautions.      Mobility Bed Mobility Overal bed mobility: Needs Assistance Bed Mobility: Rolling;Sidelying to Sit Rolling: Min guard Sidelying to sit: Min guard       General bed mobility comments: verbal cues for log roll technique.   Transfers Overall transfer level: Needs assistance Equipment used: Rolling walker (2 wheeled) Transfers: Sit to/from Stand Sit to Stand: Min assist         General transfer comment: cues for posture, back precautions and hand placement.    Balance                                            ADL Overall ADL's : Needs assistance/impaired Eating/Feeding: Independent;Sitting   Grooming: Wash/dry hands;Set up;Sitting   Upper Body Bathing: Set up;Supervision/ safety;Sitting   Lower Body  Bathing: Maximal assistance;Sit to/from stand Lower Body Bathing Details (indicate cue type and reason): pt becoming lightheaded sitting on commode. Upper Body Dressing : Minimal assistance;Sitting   Lower Body Dressing: Total assistance;Sit to/from stand Lower Body Dressing Details (indicate cue type and reason): pt becoming light headed sitting on commode and assist required. Toilet Transfer: Minimal assistance;Ambulation;BSC;RW   Toileting- Clothing Manipulation and Hygiene: Maximal assistance;Sitting/lateral lean         General ADL Comments: Pt sitting EOB and felt ok but when he stood up, pt started to feel queasy and needed to sit back down. MD in while pt sitting on EOB and informed him that pt was alittle queasy. Pt stood again and stated he felt better but with transfer onto commode 3in1, pt starting to feel warm, queasy and a little woozy. BP on commode 106/43. Assisted pt to recliner to sit up. After several minutes of rest BP 97/50 and nursing made aware. DId not get to finish session with ADL education . reviewed back precautions at start of session but did not get to fully apply to all ADL. Wife present for only part of session also. Will need to return to complete education.      Vision     Perception     Praxis      Pertinent Vitals/Pain Pain Assessment: 0-10 Pain Score: 2  Pain Descriptors / Indicators: Sore Pain Intervention(s): Repositioned  Hand Dominance     Extremity/Trunk Assessment Upper Extremity Assessment Upper Extremity Assessment: Overall WFL for tasks assessed           Communication Communication Communication: No difficulties   Cognition Arousal/Alertness: Awake/alert Behavior During Therapy: WFL for tasks assessed/performed Overall Cognitive Status: Within Functional Limits for tasks assessed                     General Comments       Exercises       Shoulder Instructions      Home Living Family/patient expects to  be discharged to:: Private residence Living Arrangements: Spouse/significant other Available Help at Discharge: Family;Available 24 hours/day Type of Home: House Home Access: Stairs to enter [employer] of Steps: 2 Entrance Stairs-Rails: Right Home Layout: Able to live on main level with bedroom/bathroom     Bathroom Shower/Tub: Occupational psychologist: Standard     Home Equipment: Environmental consultant - 2 wheels          Prior Functioning/Environment Level of Independence: Independent             OT Diagnosis: Generalized weakness   OT Problem List: Decreased strength;Decreased knowledge of use of DME or AE;Decreased knowledge of precautions;Decreased activity tolerance   OT Treatment/Interventions: Self-care/ADL training;Patient/family education;Therapeutic activities;DME and/or AE instruction    OT Goals(Current goals can be found in the care plan section) Acute Rehab OT Goals Patient Stated Goal: wants to go home when able OT Goal Formulation: With patient Time For Goal Achievement: 09/20/14 Potential to Achieve Goals: Good  OT Frequency: Min 2X/week   Barriers to D/C:            Co-evaluation              End of Session Equipment Utilized During Treatment: Gait belt;Rolling walker  Activity Tolerance: Other (comment) (became nauseous and lightheaded.) Patient left: in chair;with call bell/phone within reach   Time: 0850-0936 OT Time Calculation (min): 46 min Charges:  OT General Charges $OT Visit: 1 Procedure OT Evaluation $Initial OT Evaluation Tier I: 1 Procedure OT Treatments $Self Care/Home Management : 8-22 mins $Therapeutic Activity: 8-22 mins G-Codes: OT G-codes **NOT FOR INPATIENT CLASS** Functional Assessment Tool Used: clinical jugement Functional Limitation: Self care Self Care Current Status (P9509): At least 40 percent but less than 60 percent impaired, limited or restricted Self Care Goal Status (T2671): At least 1  percent but less than 20 percent impaired, limited or restricted  [place], [place] 09/13/2014, 9:51 AM

## 2014-09-13 NOTE — Discharge Summary (Signed)
Physician Discharge Summary   Patient ID: [name] [name] MRN: [id number] DOB/AGE: 1945-07-21 69 y.o.  Admit date: 09/12/2014 Discharge date: 09/13/2014  Primary Diagnosis:   stenosis and HNP L3-L4, L4-L5  Admission Diagnoses:  Past Medical History  Diagnosis Date  . Diabetes mellitus   . Coronary artery disease   . High cholesterol   . Ischemic cardiomyopathy   . CAD (coronary artery disease) 07/06/2011  . LV dysfunction 07/06/2011  . Stroke     [place]   . CHF (congestive heart failure)   . Arthritis     Knee L - probably  . 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 Ellison Hughs  . BPH (benign prostatic hyperplasia)   . History of nuclear stress test 09/2010    dipyridamole; mild perfusion defect due to attenuation with mild superimposed ischemia in apical septal, apical, apical inferior & apical lateral regions; rest LV enlarged in size; prominent gut uptake in infero-apical region; no significant ischemia demonstrated; low risk scan   . Right bundle branch block   . Spinal stenosis of lumbar region   . HNP (herniated nucleus pulposus), lumbar   . Nocturia   . Frequency of urination   . Sleep apnea     sleep study 10/2010- AHI during total sleep 32.1/hr and during REM 62.3/hr (severe sleep apnea)unable to tolerate c pap   Discharge Diagnoses:   Principal Problem:   Spinal stenosis of lumbar region Active Problems:   Spinal stenosis at L4-L5 level  Procedure:  Procedure(s) (LRB): LUMBAR DECOMPRESSION L3-L4, L4-L5, MICRODISCECTOMY L4-L5 (N/A)   Consults: None  HPI:  see H&P    Laboratory Data: Admission on 09/20/2013, Discharged on 09/20/2013  Component Date Value Ref Range Status  . Glucose-Capillary 09/20/2013 283* 70 - 99 mg/dL Final  . Glucose-Capillary 09/20/2013 239* 70 - 99 mg/dL Final    Recent Labs  09/13/14 0500  HGB 12.3*    Recent Labs  09/13/14 0500  WBC 7.8    RBC 4.21*  HCT 35.6*  PLT 193    Recent Labs  09/13/14 0500  NA 137  K 4.3  CL 103  CO2 25  BUN 11  CREATININE 0.83  GLUCOSE 265*  CALCIUM 8.2*   No results for input(s): LABPT, INR in the last 72 hours.  X-Rays:Dg Lumbar Spine 2-3 Views  09/12/2014   ADDENDUM REPORT: 09/12/2014 10:00  ADDENDUM: An addendum was made by Dr. David Martinique at the request of Dr. Phoebe Sharps with labeling of all the lumbar vertebral levels on the AP and lateral projections. Twelve rib pairs were demonstrated on the chest x-ray of September 15, 2013. There are 5 non rib-bearing lumbar type vertebral bodies present on the current study.   Electronically Signed   By: David  Martinique   On: 09/12/2014 10:00   09/12/2014   CLINICAL DATA:  Lumbar spine surgery.  EXAM: LUMBAR SPINE - 2-3 VIEW  COMPARISON:  None.  FINDINGS: Paraspinal soft tissues are normal. Diffuse degenerative changes lumbar spine with multilevel disc degeneration and endplate osteophyte formation. Sclerotic changes noted along the endplates of multiple vertebral bodies most likely degenerative. Aortoiliac atherosclerotic vascular disease.  IMPRESSION: Diffuse severe multilevel degenerative change. No acute or focal abnormality. Sclerotic densities noted along the endplates of multiple vertebral bodies most likely degenerative.  Electronically Signed: ByMarcello Moores  Register On: 09/07/2014 13:40   Dg Spine Portable 1 View  09/12/2014  CLINICAL DATA:  Intraoperative imaging  EXAM: PORTABLE SPINE - 1 VIEW  COMPARISON:  09/12/2014.  09/07/2014.  FINDINGS: Posterior surgical instruments extend from L3-4 to the pedicles at L5. Normal alignment.  IMPRESSION: Intraoperative localization from L3-4 to the L5 pedicles.   Electronically Signed   By: Rolm Baptise M.D.   On: 09/12/2014 12:42   Dg Spine Portable 1 View  09/12/2014   CLINICAL DATA:  Surgical level L4-5.  EXAM: PORTABLE SPINE - 1 VIEW  COMPARISON:  09/12/2014.  09/07/2014.  FINDINGS: Posterior surgical  instruments are noted, overlying the L4 and L5 levels.  IMPRESSION: Intraoperative localization of L4 and L5.   Electronically Signed   By: Rolm Baptise M.D.   On: 09/12/2014 11:50   Dg Spine Portable 1 View  09/12/2014   CLINICAL DATA:  69 year old male undergoing lumbar surgery. Initial encounter.  EXAM: PORTABLE SPINE - 1 VIEW  COMPARISON:  Preoperative radiographs 09/07/2014.  FINDINGS: Normal lumbar segmentation demonstrated on the comparison.  Intraoperative portable cross-table lateral view of the lumbar spine at 1119 hours.  Two posterior surgical probes or needles in place. The more cephalad projects at the L3 spinous process. More caudal projects at the superior aspect of the L5 spinous process.  IMPRESSION: Intraoperative localization as above.   Electronically Signed   By: Genevie Ann M.D.   On: 09/12/2014 11:35    EKG: Orders placed or performed in visit on 04/02/14  . EKG 12-Lead     Hospital Course: Patient was admitted to [facility] and taken to the OR and underwent the above state procedure without complications.  Patient tolerated the procedure well and was later transferred to the recovery room and then to the orthopaedic floor for postoperative care.  They were given PO and IV analgesics for pain control following their surgery.  They were given 24 hours of postoperative antibiotics.   PT was consulted postop to assist with mobility and transfers.  The patient was allowed to be WBAT with therapy and was taught back precautions. Discharge planning was consulted to help with postop disposition and equipment needs.  Patient had a good night on the evening of surgery and started to get up OOB with therapy on day one. Patient was seen in rounds and was ready to go home on day one.  They were given discharge instructions and dressing directions.  They were instructed on when to follow up in the office with Dr. Tonita Cong.   Diet: Diabetic diet Activity:WBAT; LSpine  precautions Follow-up:in 10-14 days Disposition - Home Discharged Condition: good   Discharge Instructions    Call MD / Call 911    Complete by:  As directed   If you experience chest pain or shortness of breath, CALL 911 and be transported to the hospital emergency room.  If you develope a fever above 101 F, pus (white drainage) or increased drainage or redness at the wound, or calf pain, call your surgeon's office.     Constipation Prevention    Complete by:  As directed   Drink plenty of fluids.  Prune juice may be helpful.  You may use a stool softener, such as Colace (over the counter) 100 mg twice a day.  Use MiraLax (over the counter) for constipation as needed.     Diet - low sodium heart healthy    Complete by:  As directed      Increase activity slowly as tolerated    Complete by:  As directed  Medication List    TAKE these medications        aspirin 325 MG tablet  Take 1 tablet (325 mg total) by mouth daily. Resume 4 days post-op     docusate sodium 100 MG capsule  Commonly known as:  COLACE  Take 1 capsule (100 mg total) by mouth 2 (two) times daily as needed for mild constipation.     glipiZIDE 10 MG 24 hr tablet  Commonly known as:  GLUCOTROL XL  Take 10 mg by mouth every morning.     ibuprofen 200 MG tablet  Commonly known as:  ADVIL,MOTRIN  Take 2 tablets (400 mg total) by mouth daily as needed for mild pain or moderate pain. Resume 4 days post-op     metFORMIN 500 MG (MOD) 24 hr tablet  Commonly known as:  GLUMETZA  Take 500 mg by mouth daily with breakfast.     naproxen sodium 220 MG tablet  Commonly known as:  ANAPROX  Take 1 tablet (220 mg total) by mouth 2 (two) times daily with a meal. May resume 4 days post-op     oxyCODONE-acetaminophen 5-325 MG per tablet  Commonly known as:  PERCOCET  Take 1 tablet by mouth every 4 (four) hours as needed.     polycarbophil 625 MG tablet  Commonly known as:  FIBERCON  Take 625 mg by mouth  daily.     rosuvastatin 20 MG tablet  Commonly known as:  CRESTOR  Take 20 mg by mouth daily.     spironolactone 25 MG tablet  Commonly known as:  ALDACTONE  Take 0.5 tablets (12.5 mg total) by mouth daily.     TRADJENTA 5 MG Tabs tablet  Generic drug:  linagliptin  Take 1 tablet by mouth daily.     Vitamin D3 5000 UNITS Caps  Take 1 capsule by mouth daily.      Oxycodone D/C'd, Norco 5/325mg  sent to pharmacy 1-2 pills q 4-6 hr prn pain     Follow-up Information    Follow up with BEANE,JEFFREY C, MD In 2 weeks.   Specialty:  Orthopedic Surgery   Why:  For suture removal   Contact information:   [address] [place] [address] [phone]       Follow up with Johnn Hai, MD In 2 weeks.   Specialty:  Orthopedic Surgery   Contact information:   [address] [place] [address] [phone]       Follow up with [facility].   Why:  3n1 (over the commode seat)   Contact information:   [address] [place] [address] [phone]       Signed: Lacie Draft, PA-C Orthopaedic Surgery 09/13/2014, 2:10 PM

## 2014-09-13 NOTE — Progress Notes (Signed)
Subjective: 1 Day Post-Op Procedure(s) (LRB): LUMBAR DECOMPRESSION L3-L4, L4-L5, MICRODISCECTOMY L4-L5 (N/A) Patient reports pain as mild.  Reports incisional back pain. His leg is feeling better this morning. He just sat up at the edge of his bed with PT and is feeling somewhat lightheaded, has not been up walking the halls yet. Otherwise no c/o. Seen by Dr. Tonita [name].  Objective: Vital signs in last 24 hours: Temp:  [97.3 F (36.3 C)-99 F (37.2 C)] 99 F (37.2 C) (03/17 0546) Pulse Rate:  [68-92] 91 (03/17 0546) Resp:  [7-20] 20 (03/17 0710) BP: (118-151)/(59-92) 118/59 mmHg (03/17 0546) SpO2:  [99 %-100 %] 99 % (03/17 0710)  Intake/Output from previous day: 03/16 0701 - 03/17 0700 In: 2833.8 [P.O.:700; I.V.:2033.8; IV Piggyback:100] Out: 2575 [Urine:2525; Blood:50] Intake/Output this shift: Total I/O In: 1111.3 [P.O.:340; I.V.:771.3] Out: 225 [Urine:225]   Recent Labs  09/13/14 0500  HGB 12.3*    Recent Labs  09/13/14 0500  WBC 7.8  RBC 4.21*  HCT 35.6*  PLT 193    Recent Labs  09/13/14 0500  NA 137  K 4.3  CL 103  CO2 25  BUN 11  CREATININE 0.83  GLUCOSE 265*  CALCIUM 8.2*   No results for input(s): LABPT, INR in the last 72 hours.  Neurologically intact ABD soft Neurovascular intact Sensation intact distally Intact pulses distally Dorsiflexion/Plantar flexion intact Incision: dressing C/D/I and no drainage No cellulitis present Compartment soft no calf pain or sign of DVT  Assessment/Plan: 1 Day Post-Op Procedure(s) (LRB): LUMBAR DECOMPRESSION L3-L4, L4-L5, MICRODISCECTOMY L4-L5 (N/A) Advance diet Up with therapy D/C IV fluids  Hyperglycemic on sliding scale, will restart PO agents Will see how he tolerates PT today, if doing well later today with pain well controlled, voiding without difficulty, glucose stable, and ambulating well, he can D/C to home  [name] [name] M. 09/13/2014, 9:19 AM

## 2014-09-13 NOTE — Op Note (Signed)
NAMEJOVONI, BORKENHAGEN                ACCOUNT NO.:  [id number]  MEDICAL RECORD NO.:  [id number]  LOCATION:  2330                         FACILITY:  [facility]  PHYSICIAN:  [name], M.D.    DATE OF BIRTH:  1946/03/29  DATE OF PROCEDURE:  09/12/2014 DATE OF DISCHARGE:                              OPERATIVE REPORT   PREOPERATIVE DIAGNOSIS:  Spinal stenosis, L4-5, L3-4; herniated nucleus pulposus L4-5.  POSTOPERATIVE DIAGNOSIS:  Spinal stenosis, L4-5, L3-4; herniated nucleus pulposus L4-5; epidural lipomatosis.  PROCEDURE PERFORMED: 1. Microlumbar decompression L3-4, L4-5. 2. Bilateral foraminotomies L5-L4, L3 on the left. 3. Microdiskectomy L4-5.  ANESTHESIA:  General.  ASSISTANT:  Cleophas Dunker, PA.  HISTORY:  A 69 year old with neurogenic claudication secondary to severe spinal stenosis L4-5 and a subsequent disc herniation with exacerbation of the symptoms, weakness in the quad in the left lower extremity.  He was indicated for decompression at L4-5 and probable L3-4 as the disc herniation migrated cephalad up to the left hand side extending up to the foramen of L3.  We did discuss the risks and benefits including bleeding, infection, damage to neurovascular structures, DVT, PE, anesthetic complications, etc.  TECHNIQUE:  With the patient supine position, after induction of adequate general anesthesia, 2 g Kefzol, placed prone on the [place] frame.  All bony prominences were well padded.  Foley to gravity. Lumbar region was prepped and draped in usual sterile fashion.  Two 18- gauge spinal needles were utilized to localize L4-5 interspace, confirmed with x-ray.  Incision was made from spinous process of L3 to below L5, subcutaneous tissue was dissected.  The electrocautery was utilized to achieve hemostasis.  Dorsal and lumbar fascia identified and divided in line with the skin incision.  I infiltrated paraspinous musculature with 0.25% Marcaine with epinephrine.   Paraspinous musculature was elevated from L3-4 and L4-5 bilaterally.  McCullough retractor was placed.  Operating microscope was draped and brought on the surgical field.  Confirmatory radiograph obtained.  We removed the spinous process of L4 with a Leksell rongeur.  I partially remove the spinous process of L3 and of L5.  We first used a 2 mm Kerrison and performed a hemilaminotomy of the caudad edge of L4 centrally utilizing 2 mm Kerrison continuing cephalad.  We removed the hemi lamina, there was hypertrophic ligamentum flavum noted.  We detached ligamentum flavum from the cephalad edge of L5 utilizing straight curette.  We then performed foraminotomies of L5 bilaterally with a 2 mm Kerrison, severe stenosis was noted.  We then meticulously proceeded to decompress bilaterally at L4-5 from both sides of the operating room table with a 2 mm Kerrison first on the right to the medial border of the pedicle performing foraminotomies of L5 and L4, and then onto the left performing foraminotomies of L5 and L4 then at L3, there was severe stenosis on the left and compression of the L3 and L4 roots.  I removed ligamentum flavum from the interspace at L3-4, there was severe stenosis noted here as well.  Epidural lipomatosis was noted as well contributing to the compression.  After decompressing the lateral recess to the medial border of the pedicle, we gently mobilized thecal sac medially  just above the disc space at L4-5.  Large disc fragment was noted extending into the L4 and up into behind the body of L4 and the L3 foramen.  We mobilized it with a nerve hook and a micropituitary and removed 6 fragments, 2 fairly large fragments from beneath thecal sac from caudad and cephalad extending near the disc space at L3-4 and below the disc space at L4-5.  They are also extended out into the L4 foramen. Following retrieval of these 6 fragments, there was good restoration of the thecal sac.  A neuro  probe passed freely at the foramen of L5, L4, and L3 bilaterally.  There was good mobilization of the thecal sac with some attenuation of the thecal sac laterally, but no CSF leakage was noted.  Bipolar electrocautery was utilized to achieve hemostasis. Copiously irrigated the wound.  We obtained a confirmatory radiograph indicating the area of decompression at L3-4 and L4-5.  Neuro probe passed freely at both foramen of L5, L4, and L3 bilaterally.  Copiously irrigated the wound.  Placed thrombin-soaked Gelfoam in the laminotomy defects.  We removed the [facility] retractor, irrigated the paraspinous musculature, closed the dorsolumbar fascia with 1 Vicryl, subcu with 2- 0, and skin with staples.  Wound was dressed sterilely, placed supine on hospital bed, extubated without difficulty, and transported to the recovery room in satisfactory condition.  The patient tolerated the procedure well.  There were no complications. Assistant Cleophas Dunker, [place] was used throughout the case for general intermittent neural traction, closure, and patient positioning. Blood loss 50 mL.     [name], M.D.     Geralynn Rile  D:  09/12/2014  T:  09/13/2014  Job:  [id number]

## 2014-09-13 NOTE — Progress Notes (Signed)
Discharged from floor via w/c, family and belongings with pt. No changes in assessment. Omir Cooprider, [employer]

## 2014-09-25 DIAGNOSIS — Z4789 Encounter for other orthopedic aftercare: Secondary | ICD-10-CM | POA: Diagnosis not present

## 2014-10-08 ENCOUNTER — Telehealth: Payer: Self-pay | Admitting: *Deleted

## 2014-10-08 ENCOUNTER — Ambulatory Visit (INDEPENDENT_AMBULATORY_CARE_PROVIDER_SITE_OTHER): Payer: Medicare Other | Admitting: Internal Medicine

## 2014-10-08 ENCOUNTER — Encounter: Payer: Self-pay | Admitting: Internal Medicine

## 2014-10-08 VITALS — BP 108/62 | HR 82 | Ht 74.0 in | Wt 252.0 lb

## 2014-10-08 DIAGNOSIS — I2583 Coronary atherosclerosis due to lipid rich plaque: Secondary | ICD-10-CM

## 2014-10-08 DIAGNOSIS — I2510 Atherosclerotic heart disease of native coronary artery without angina pectoris: Secondary | ICD-10-CM | POA: Diagnosis not present

## 2014-10-08 DIAGNOSIS — M4806 Spinal stenosis, lumbar region: Secondary | ICD-10-CM | POA: Diagnosis not present

## 2014-10-08 DIAGNOSIS — Z951 Presence of aortocoronary bypass graft: Secondary | ICD-10-CM

## 2014-10-08 DIAGNOSIS — I255 Ischemic cardiomyopathy: Secondary | ICD-10-CM

## 2014-10-08 DIAGNOSIS — M48061 Spinal stenosis, lumbar region without neurogenic claudication: Secondary | ICD-10-CM

## 2014-10-08 NOTE — Patient Instructions (Addendum)
Your physician wants you to follow-up in: 6 months with Dr. Debara Pickett. You will receive a reminder letter in the mail two months in advance. If you don't receive a letter, please call our office to schedule the follow-up appointment.  Patient notified to decrease crestor to 10mg  daily based on lab results received from PCP office

## 2014-10-08 NOTE — Progress Notes (Signed)
OFFICE NOTE  Chief Complaint:  Weakness  Primary Care Physician: [name] [name], MD  HPI:  [name] [name] is a pleasant 69 year old gentleman previously followed by Dr. Rollene Fare. His past medical history is significant for coronary artery disease. In 2013 he underwent multivessel CABG for an ischemic cardiomyopathy. EF was 20-25% prior to surgery however post surgery his EF had improved up to 45-50%. Recently his EF was 40-45% by echo in May of 2014. There was mild mitral regurgitation, mild to moderately dilated left atrium and mild LVH. [name] [name] has been describing some anxiety. He also reports some pain in his legs however underwent Dopplers in September 2014 which showed preserved ABIs bilaterally a 1.0 on the right and 1.1 on the left. The bilateral peroneal and posterior tibial arteries were occluded. He reports some optimal control of his diabetes. His A1c was 7.2. He is concerned about the cost of taking Tradjenta. He is followed by Dr. Elyse Hsu.   He was recently seen in the emergency room [facility]. There he presented with hypotension and was given 1 L normal saline and his symptoms improved. He had been having dizziness as well as some upper back pain into the left shoulder blade with exertion recently. The symptoms are worse particularly when climbing up ladders or going up stairs and are relieved at rest. After that hospitalization it was recommended that he discontinue his ACE inhibitor and beta blocker, and his blood pressure appears improved today up to 110/60.  [name] [name] returns today for followup of his stress test. This is interpreted as nonischemic with an EF of 44%. He reports still feeling very fatigued and extremely short of breath when doing activities. He says that the other day he tried to hit golf balls and felt that he was totally exhausted after just an hour. He reports his blood pressure has improved somewhat off of all medications however remains  low. He has had symptoms of orthostatic hypotension in the past. His symptoms do feel similar to prior to his bypass surgery. He also feels that he was much better after surgery than he is now and that his symptoms have been going on for the past 2 months.  At his last office visit, I recommended that [name] [name] have a repeat cardiac catheterization. This demonstrated the following:  Impression:  1. 2 vessel native CAD with occluded LAD in the mid-vessel  2. Patent LIMA-LAD, SVG to PLA and SVG to PDA grafts with TIMI III flow  3. LVEDP = 13 mmHg  4. Random PVC's were noted  Based on these findings, I could not find any new cardiac cause of his symptoms. I suspect that some of his fatigue could be related to labile blood sugars. He says unexplained hypotension but is normotensive off of medications.  [name] [name] returns today for followup. He now reports feeling better since he had change in his medications. He is established with Dr. Buddy Duty who adjusted his diabetes medicines and stopped his Invokana. His shortness of breath and fatigue in both improved. He is now been expected some problems with left heel pain. He was found to have some spinal stenosis but has had improvement with inserts in his shoes.  I saw [name] [name] back in the office today. He has successfully underwent ask surgery. He reports an improvement in his pain however he feels that he slow to recover. He also feels some diffuse weakness.  PMHx:  Past Medical History  Diagnosis Date  . Diabetes mellitus   . Coronary artery disease   . High cholesterol   . Ischemic cardiomyopathy   . CAD (coronary artery disease) 07/06/2011  . LV dysfunction 07/06/2011  . Stroke     [place]   . CHF (congestive heart failure)   . Arthritis     Knee L - probably  . 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 Ellison Hughs  . BPH (benign prostatic hyperplasia)   .  History of nuclear stress test 09/2010    dipyridamole; mild perfusion defect due to attenuation with mild superimposed ischemia in apical septal, apical, apical inferior & apical lateral regions; rest LV enlarged in size; prominent gut uptake in infero-apical region; no significant ischemia demonstrated; low risk scan   . Right bundle branch block   . Spinal stenosis of lumbar region   . HNP (herniated nucleus pulposus), lumbar   . Nocturia   . Frequency of urination   . Sleep apnea     sleep study 10/2010- AHI during total sleep 32.1/hr and during REM 62.3/hr (severe sleep apnea)unable to tolerate c pap    Past Surgical History  Procedure Laterality Date  . Cardiac catheterization  01/2011    ischemic cardiomyopathy 30-35%, multivessel CAD (Dr. [name])   . Pilonidal cyst removed    . Colonscopy    . Transthoracic echocardiogram  11/10/2012    EF 40-45%, mild LVH, mild hypokinesis of anteroseptal myocardium, grade 1 diastolic dysfunction; mild MR & calcifed mitral annulus; LA mild-mod dilated; RA mildly dilated  . Lower extremity arterial doppler  03/16/2013    bilat ABIs demonstated normal values; R runoff - posterior tibial & anterior tibial arteries occluded; L runoff - peroneal & posterior tibial arteries occluded, anterior tibial artery appears occluded  . Left heart catheterization with coronary/graft angiogram N/A 09/20/2013    Procedure: LEFT HEART CATHETERIZATION WITH [name];  Surgeon: [name], MD;  Location: [facility] CATH LAB;  Service: Cardiovascular;  Laterality: N/A;  . Coronary artery bypass graft  08/03/2011    Procedure: CORONARY ARTERY BYPASS GRAFTING (CABG);  Surgeon: [name], MD;  Location: [place];  Service: Open Heart Surgery;  Laterality: N/A;  CABG times three using right saphenous vein and left mammary artery usisng endoscope.  . Lumbar laminectomy/decompression microdiscectomy N/A 09/12/2014    Procedure: LUMBAR DECOMPRESSION L3-L4, L4-L5,  MICRODISCECTOMY L4-L5;  Surgeon: [name], MD;  Location: [facility];  Service: Orthopedics;  Laterality: N/A;    FAMHx:  Family History  Problem Relation Age of Onset  . Anesthesia problems Daughter   . Cancer Mother   . Lung disease Father     & heart disease  . Colon cancer Sister   . Sudden death Maternal Grandmother     SOCHx:   reports that he quit smoking about 3 years ago. His smoking use included Cigars. He quit smokeless tobacco use about 2 years ago. He reports that he drinks alcohol. He reports that he does not use illicit drugs.  ALLERGIES:  No Known Allergies  ROS: A comprehensive review of systems was negative except for: Musculoskeletal: positive for back pain and left heel pain Neurological: positive for weakness  HOME MEDS: Current Outpatient Prescriptions  Medication Sig Dispense Refill  . aspirin EC 81 MG tablet Take 81 mg by mouth daily.    . Cholecalciferol (VITAMIN D3) 5000 UNITS CAPS Take  1 capsule by mouth daily.    Marland Kitchen docusate sodium (COLACE) 100 MG capsule Take 1 capsule (100 mg total) by mouth 2 (two) times daily as needed for mild constipation. 20 capsule 1  . glipiZIDE (GLUCOTROL XL) 10 MG 24 hr tablet Take 10 mg by mouth every morning.     . metFORMIN (GLUMETZA) 500 MG (MOD) 24 hr tablet Take 500 mg by mouth daily with breakfast.    . naproxen sodium (ANAPROX) 220 MG tablet Take 1 tablet (220 mg total) by mouth 2 (two) times daily with a meal. May resume 4 days post-op    . polycarbophil (FIBERCON) 625 MG tablet Take 625 mg by mouth daily.    . rosuvastatin (CRESTOR) 20 MG tablet Take 20 mg by mouth daily.    Marland Kitchen spironolactone (ALDACTONE) 25 MG tablet Take 0.5 tablets (12.5 mg total) by mouth daily. 15 tablet 9  . TRADJENTA 5 MG TABS tablet Take 1 tablet by mouth daily.     No current facility-administered medications for this visit.    LABS/IMAGING: No results found for this or any previous visit (from the past 48 hour(s)). No results  found.  VITALS: BP 108/62 mmHg  Pulse 82  Ht 6\' 2"  (1.88 m)  Wt 252 lb (114.306 kg)  BMI 32.34 kg/m2  EXAM: General appearance: alert and no distress Neck: no carotid bruit and no JVD Lungs: clear to auscultation bilaterally Heart: regular rate and rhythm, S1, S2 normal, no murmur, click, rub or gallop Abdomen: soft, non-tender; bowel sounds normal; no masses,  no organomegaly Extremities: extremities normal, atraumatic, no cyanosis or edema Pulses: 2+ bilateral DP, 2+ L PT, 1+ R PT Skin: Skin color, texture, turgor normal. No rashes or lesions Neurologic: Grossly normal Psych: Pleasant  EKG: Sinus rhythm with first degree AV block and bifascicular block at 82, fusion beats  ASSESSMENT: 1. Coronary artery disease status post three-vessel CABG in 2013 (LIMA to LAD, SVG to PDA and SVG to PLA) 2. Hypertension-controlled 3. Dyslipidemia 4. Diabetes type 2 - better controlled 5. Mild PVD-with normal ABI 6. Hypotension/upper back pain - resolved 7. Spinal stenosis - s/p surgery  PLAN: 1.   Mr. Jasmin has had improvement in his fatigue and shortness of breath with changes in his diabetes medicines. His back pain has now improved after surgery. He had ruptured a disc which prompted the more urgent need for surgery. He feels like he is recovering but very slowly. He also feels somewhat fatigued with some weakness. Recent lab work shows that his cholesterol profile is actually very low with total cholesterol 98, HDL 35, triglycerides 122 and LDL 39. Based on these findings would seem that he is overmedicated with regards to his Crestor. I would recommend decreasing that to 10 mg daily. He may be even able to go lower. It would be interesting to see if this helped somewhat with his weakness.  Otherwise is doing fine from a cardiac standpoint we'll plan to see him back in 6 months.  [name], MD, [facility] Attending Cardiologist [facility]  [name],Kenneth C 10/08/2014, 5:06  PM

## 2014-10-08 NOTE — Telephone Encounter (Signed)
Pt. Called and instructed to decrease his crestor to 10 mg daily

## 2014-10-09 DIAGNOSIS — M4806 Spinal stenosis, lumbar region: Secondary | ICD-10-CM | POA: Diagnosis not present

## 2014-10-09 NOTE — Addendum Note (Signed)
Addended by: Fidel Levy on: 10/09/2014 08:13 AM   Modules accepted: Medications

## 2014-10-11 ENCOUNTER — Encounter: Payer: Self-pay | Admitting: Internal Medicine

## 2014-10-23 DIAGNOSIS — Z4789 Encounter for other orthopedic aftercare: Secondary | ICD-10-CM | POA: Diagnosis not present

## 2014-10-29 DIAGNOSIS — M4806 Spinal stenosis, lumbar region: Secondary | ICD-10-CM | POA: Diagnosis not present

## 2014-11-16 DIAGNOSIS — E780 Pure hypercholesterolemia: Secondary | ICD-10-CM | POA: Diagnosis not present

## 2014-11-16 DIAGNOSIS — G4733 Obstructive sleep apnea (adult) (pediatric): Secondary | ICD-10-CM | POA: Diagnosis not present

## 2014-11-16 DIAGNOSIS — I255 Ischemic cardiomyopathy: Secondary | ICD-10-CM | POA: Diagnosis not present

## 2014-11-16 DIAGNOSIS — E1165 Type 2 diabetes mellitus with hyperglycemia: Secondary | ICD-10-CM | POA: Diagnosis not present

## 2014-11-16 DIAGNOSIS — I10 Essential (primary) hypertension: Secondary | ICD-10-CM | POA: Diagnosis not present

## 2014-11-19 DIAGNOSIS — K5901 Slow transit constipation: Secondary | ICD-10-CM | POA: Diagnosis not present

## 2014-11-19 DIAGNOSIS — E1165 Type 2 diabetes mellitus with hyperglycemia: Secondary | ICD-10-CM | POA: Diagnosis not present

## 2014-11-19 DIAGNOSIS — M5432 Sciatica, left side: Secondary | ICD-10-CM | POA: Diagnosis not present

## 2014-11-19 DIAGNOSIS — I255 Ischemic cardiomyopathy: Secondary | ICD-10-CM | POA: Diagnosis not present

## 2014-11-19 DIAGNOSIS — Z1389 Encounter for screening for other disorder: Secondary | ICD-10-CM | POA: Diagnosis not present

## 2014-11-19 DIAGNOSIS — I5022 Chronic systolic (congestive) heart failure: Secondary | ICD-10-CM | POA: Diagnosis not present

## 2014-11-19 DIAGNOSIS — I10 Essential (primary) hypertension: Secondary | ICD-10-CM | POA: Diagnosis not present

## 2014-11-19 DIAGNOSIS — N401 Enlarged prostate with lower urinary tract symptoms: Secondary | ICD-10-CM | POA: Diagnosis not present

## 2014-11-20 DIAGNOSIS — E1165 Type 2 diabetes mellitus with hyperglycemia: Secondary | ICD-10-CM | POA: Diagnosis not present

## 2014-11-22 ENCOUNTER — Encounter: Payer: Self-pay | Admitting: Internal Medicine

## 2014-12-10 DIAGNOSIS — Z4789 Encounter for other orthopedic aftercare: Secondary | ICD-10-CM | POA: Diagnosis not present

## 2014-12-18 ENCOUNTER — Other Ambulatory Visit: Payer: Self-pay | Admitting: *Deleted

## 2014-12-18 MED ORDER — SPIRONOLACTONE 25 MG PO TABS: 12.5000 mg | ORAL_TABLET | Freq: Every day | ORAL | Status: DC

## 2014-12-18 NOTE — Telephone Encounter (Signed)
Rx(s) sent to pharmacy electronically.  

## 2014-12-21 DIAGNOSIS — E1165 Type 2 diabetes mellitus with hyperglycemia: Secondary | ICD-10-CM | POA: Diagnosis not present

## 2015-01-20 DIAGNOSIS — I10 Essential (primary) hypertension: Secondary | ICD-10-CM | POA: Diagnosis not present

## 2015-02-08 DIAGNOSIS — M9906 Segmental and somatic dysfunction of lower extremity: Secondary | ICD-10-CM | POA: Diagnosis not present

## 2015-02-08 DIAGNOSIS — M791 Myalgia: Secondary | ICD-10-CM | POA: Diagnosis not present

## 2015-02-08 DIAGNOSIS — G5732 Lesion of lateral popliteal nerve, left lower limb: Secondary | ICD-10-CM | POA: Diagnosis not present

## 2015-02-08 DIAGNOSIS — M25672 Stiffness of left ankle, not elsewhere classified: Secondary | ICD-10-CM | POA: Diagnosis not present

## 2015-02-08 DIAGNOSIS — M21372 Foot drop, left foot: Secondary | ICD-10-CM | POA: Diagnosis not present

## 2015-02-08 DIAGNOSIS — R201 Hypoesthesia of skin: Secondary | ICD-10-CM | POA: Diagnosis not present

## 2015-02-11 DIAGNOSIS — M21372 Foot drop, left foot: Secondary | ICD-10-CM | POA: Diagnosis not present

## 2015-02-11 DIAGNOSIS — G5732 Lesion of lateral popliteal nerve, left lower limb: Secondary | ICD-10-CM | POA: Diagnosis not present

## 2015-02-11 DIAGNOSIS — M25672 Stiffness of left ankle, not elsewhere classified: Secondary | ICD-10-CM | POA: Diagnosis not present

## 2015-02-11 DIAGNOSIS — R201 Hypoesthesia of skin: Secondary | ICD-10-CM | POA: Diagnosis not present

## 2015-02-11 DIAGNOSIS — M791 Myalgia: Secondary | ICD-10-CM | POA: Diagnosis not present

## 2015-02-11 DIAGNOSIS — M9906 Segmental and somatic dysfunction of lower extremity: Secondary | ICD-10-CM | POA: Diagnosis not present

## 2015-02-12 DIAGNOSIS — M9906 Segmental and somatic dysfunction of lower extremity: Secondary | ICD-10-CM | POA: Diagnosis not present

## 2015-02-12 DIAGNOSIS — M791 Myalgia: Secondary | ICD-10-CM | POA: Diagnosis not present

## 2015-02-12 DIAGNOSIS — R201 Hypoesthesia of skin: Secondary | ICD-10-CM | POA: Diagnosis not present

## 2015-02-12 DIAGNOSIS — M21372 Foot drop, left foot: Secondary | ICD-10-CM | POA: Diagnosis not present

## 2015-02-12 DIAGNOSIS — M25672 Stiffness of left ankle, not elsewhere classified: Secondary | ICD-10-CM | POA: Diagnosis not present

## 2015-02-12 DIAGNOSIS — G5732 Lesion of lateral popliteal nerve, left lower limb: Secondary | ICD-10-CM | POA: Diagnosis not present

## 2015-02-18 DIAGNOSIS — M9906 Segmental and somatic dysfunction of lower extremity: Secondary | ICD-10-CM | POA: Diagnosis not present

## 2015-02-18 DIAGNOSIS — R201 Hypoesthesia of skin: Secondary | ICD-10-CM | POA: Diagnosis not present

## 2015-02-18 DIAGNOSIS — M25672 Stiffness of left ankle, not elsewhere classified: Secondary | ICD-10-CM | POA: Diagnosis not present

## 2015-02-18 DIAGNOSIS — M791 Myalgia: Secondary | ICD-10-CM | POA: Diagnosis not present

## 2015-02-18 DIAGNOSIS — M21372 Foot drop, left foot: Secondary | ICD-10-CM | POA: Diagnosis not present

## 2015-02-18 DIAGNOSIS — G5732 Lesion of lateral popliteal nerve, left lower limb: Secondary | ICD-10-CM | POA: Diagnosis not present

## 2015-02-20 DIAGNOSIS — I10 Essential (primary) hypertension: Secondary | ICD-10-CM | POA: Diagnosis not present

## 2015-02-25 DIAGNOSIS — G5732 Lesion of lateral popliteal nerve, left lower limb: Secondary | ICD-10-CM | POA: Diagnosis not present

## 2015-02-25 DIAGNOSIS — M25672 Stiffness of left ankle, not elsewhere classified: Secondary | ICD-10-CM | POA: Diagnosis not present

## 2015-02-25 DIAGNOSIS — M791 Myalgia: Secondary | ICD-10-CM | POA: Diagnosis not present

## 2015-02-25 DIAGNOSIS — M9906 Segmental and somatic dysfunction of lower extremity: Secondary | ICD-10-CM | POA: Diagnosis not present

## 2015-02-25 DIAGNOSIS — R201 Hypoesthesia of skin: Secondary | ICD-10-CM | POA: Diagnosis not present

## 2015-02-25 DIAGNOSIS — M21372 Foot drop, left foot: Secondary | ICD-10-CM | POA: Diagnosis not present

## 2015-03-06 DIAGNOSIS — M25672 Stiffness of left ankle, not elsewhere classified: Secondary | ICD-10-CM | POA: Diagnosis not present

## 2015-03-06 DIAGNOSIS — G5732 Lesion of lateral popliteal nerve, left lower limb: Secondary | ICD-10-CM | POA: Diagnosis not present

## 2015-03-06 DIAGNOSIS — M21372 Foot drop, left foot: Secondary | ICD-10-CM | POA: Diagnosis not present

## 2015-03-06 DIAGNOSIS — M9906 Segmental and somatic dysfunction of lower extremity: Secondary | ICD-10-CM | POA: Diagnosis not present

## 2015-03-06 DIAGNOSIS — M791 Myalgia: Secondary | ICD-10-CM | POA: Diagnosis not present

## 2015-03-06 DIAGNOSIS — R201 Hypoesthesia of skin: Secondary | ICD-10-CM | POA: Diagnosis not present

## 2015-03-18 DIAGNOSIS — E780 Pure hypercholesterolemia: Secondary | ICD-10-CM | POA: Diagnosis not present

## 2015-03-18 DIAGNOSIS — I10 Essential (primary) hypertension: Secondary | ICD-10-CM | POA: Diagnosis not present

## 2015-03-18 DIAGNOSIS — N401 Enlarged prostate with lower urinary tract symptoms: Secondary | ICD-10-CM | POA: Diagnosis not present

## 2015-03-18 DIAGNOSIS — E1165 Type 2 diabetes mellitus with hyperglycemia: Secondary | ICD-10-CM | POA: Diagnosis not present

## 2015-03-18 DIAGNOSIS — I5022 Chronic systolic (congestive) heart failure: Secondary | ICD-10-CM | POA: Diagnosis not present

## 2015-03-20 DIAGNOSIS — G5732 Lesion of lateral popliteal nerve, left lower limb: Secondary | ICD-10-CM | POA: Diagnosis not present

## 2015-03-20 DIAGNOSIS — R201 Hypoesthesia of skin: Secondary | ICD-10-CM | POA: Diagnosis not present

## 2015-03-20 DIAGNOSIS — M25672 Stiffness of left ankle, not elsewhere classified: Secondary | ICD-10-CM | POA: Diagnosis not present

## 2015-03-20 DIAGNOSIS — M9906 Segmental and somatic dysfunction of lower extremity: Secondary | ICD-10-CM | POA: Diagnosis not present

## 2015-03-20 DIAGNOSIS — M791 Myalgia: Secondary | ICD-10-CM | POA: Diagnosis not present

## 2015-03-20 DIAGNOSIS — M21372 Foot drop, left foot: Secondary | ICD-10-CM | POA: Diagnosis not present

## 2015-03-23 DIAGNOSIS — I10 Essential (primary) hypertension: Secondary | ICD-10-CM | POA: Diagnosis not present

## 2015-03-25 DIAGNOSIS — E1140 Type 2 diabetes mellitus with diabetic neuropathy, unspecified: Secondary | ICD-10-CM | POA: Diagnosis not present

## 2015-03-25 DIAGNOSIS — N401 Enlarged prostate with lower urinary tract symptoms: Secondary | ICD-10-CM | POA: Diagnosis not present

## 2015-03-25 DIAGNOSIS — I10 Essential (primary) hypertension: Secondary | ICD-10-CM | POA: Diagnosis not present

## 2015-03-25 DIAGNOSIS — M5432 Sciatica, left side: Secondary | ICD-10-CM | POA: Diagnosis not present

## 2015-03-25 DIAGNOSIS — I255 Ischemic cardiomyopathy: Secondary | ICD-10-CM | POA: Diagnosis not present

## 2015-03-25 DIAGNOSIS — I5022 Chronic systolic (congestive) heart failure: Secondary | ICD-10-CM | POA: Diagnosis not present

## 2015-03-25 DIAGNOSIS — Z23 Encounter for immunization: Secondary | ICD-10-CM | POA: Diagnosis not present

## 2015-03-25 DIAGNOSIS — E1165 Type 2 diabetes mellitus with hyperglycemia: Secondary | ICD-10-CM | POA: Diagnosis not present

## 2015-03-25 DIAGNOSIS — K5901 Slow transit constipation: Secondary | ICD-10-CM | POA: Diagnosis not present

## 2015-03-27 ENCOUNTER — Other Ambulatory Visit: Payer: Self-pay

## 2015-03-27 NOTE — Patient Outreach (Signed)
[facility]) Care Management  03/27/2015  [name] [name] April 10, 1946 [id number]  First telephone call to patient regarding Tier 2 referral.  No answer.  HIPAA compliant message left.   Plan: RN Health Coach will attempt telephone outreach within 1-2 weeks.    [name] Baseman, RN, MSN [place] [phone]

## 2015-03-27 NOTE — Patient Outreach (Signed)
[facility] [facility]) Care Management  03/27/2015  [name] [name] [birth year] [id number]   Referral from [place] 2 List, assigned Jon Billings, RN.  Thanks, Ronnell [place]. [address], [place] Assistant Phone: [phone] Fax: [phone]

## 2015-04-03 ENCOUNTER — Other Ambulatory Visit: Payer: Self-pay

## 2015-04-03 NOTE — Patient Outreach (Signed)
[facility]) Care Management  04/03/2015  [name] [name] 1945-11-23 [id number]   Second telephone call to patient regarding Tier 2 referral.  No answer.  HIPAA compliant message left.   Plan: RN Health Coach will attempt telephone outreach within 1-2 weeks.    Jone Baseman, RN, MSN [place] [phone]

## 2015-04-11 ENCOUNTER — Other Ambulatory Visit: Payer: Self-pay

## 2015-04-11 NOTE — Patient Outreach (Signed)
[facility]) Care Management  04/11/2015  HERNAN TURNAGE [birth year] [id number]   Third telephone call to patient regarding Tier 2 referral.  No answer.  HIPAA compliant message left.    Plan: RN will send outreach letter to attempt contact.    Jone Baseman, RN, MSN [place] [phone]

## 2015-04-17 ENCOUNTER — Telehealth: Payer: Self-pay | Admitting: *Deleted

## 2015-04-17 ENCOUNTER — Encounter: Payer: Self-pay | Admitting: Internal Medicine

## 2015-04-17 ENCOUNTER — Ambulatory Visit (INDEPENDENT_AMBULATORY_CARE_PROVIDER_SITE_OTHER): Payer: Medicare Other | Admitting: Internal Medicine

## 2015-04-17 VITALS — BP 134/82 | HR 87 | Ht 74.0 in | Wt 255.4 lb

## 2015-04-17 DIAGNOSIS — G4733 Obstructive sleep apnea (adult) (pediatric): Secondary | ICD-10-CM | POA: Diagnosis not present

## 2015-04-17 DIAGNOSIS — R5383 Other fatigue: Secondary | ICD-10-CM

## 2015-04-17 DIAGNOSIS — I255 Ischemic cardiomyopathy: Secondary | ICD-10-CM | POA: Diagnosis not present

## 2015-04-17 DIAGNOSIS — Z951 Presence of aortocoronary bypass graft: Secondary | ICD-10-CM

## 2015-04-17 NOTE — Telephone Encounter (Signed)
Called patient with MD advice as noted. He voiced understanding.

## 2015-04-17 NOTE — Patient Instructions (Addendum)
Your physician has recommended that you have a sleep study @ [facility] - follow up with Dr. Radford Pax. This test records several body functions during sleep, including: brain activity, eye movement, oxygen and carbon dioxide blood levels, heart rate and rhythm, breathing rate and rhythm, the flow of air through your mouth and nose, snoring, body muscle movements, and chest and belly movement.  Ask Primary MD about going back on Tradjenta   Ask Primary MD about gabapentin for nerve pain  Your physician wants you to follow-up in: 6 months with Dr. Debara Pickett. You will receive a reminder letter in the mail two months in advance. If you don't receive a letter, please call our office to schedule the follow-up appointment.   If you need a refill on your cardiac medications before your next appointment, please call your pharmacy.  Happy [place] Birthday!

## 2015-04-17 NOTE — Telephone Encounter (Signed)
-----   Message from [name], MD sent at 04/17/2015  5:27 PM EDT ----- Regarding: RE: oximetry study We could get an overnight oximetry, but I don't think I would put him on oxygen for 2 months until he has a formal sleep study. Rather, he should try to use his current mask if he can until he has the repeat study.  DR. Lemmie Evens  ----- Message -----    From: Fidel Levy, RN    Sent: 04/17/2015   9:55 AM      To: [name], MD Subject: oximetry study                                 Daughter asked about ordering an overnight oximetry study for her dad as his sleep study is months out?

## 2015-04-17 NOTE — Progress Notes (Signed)
OFFICE NOTE  Chief Complaint:  Fatigue, low energy  Primary Care Physician: [name] [name], MD  HPI:  [name] [name] is a pleasant 69 year old gentleman previously followed by Dr. Rollene Fare. His past medical history is significant for coronary artery disease. In 2013 he underwent multivessel CABG for an ischemic cardiomyopathy. EF was 20-25% prior to surgery however post surgery his EF had improved up to 45-50%. Recently his EF was 40-45% by echo in May of 2014. There was mild mitral regurgitation, mild to moderately dilated left atrium and mild LVH. [name] [name] has been describing some anxiety. He also reports some pain in his legs however underwent Dopplers in September 2014 which showed preserved ABIs bilaterally a 1.0 on the right and 1.1 on the left. The bilateral peroneal and posterior tibial arteries were occluded. He reports some optimal control of his diabetes. His A1c was 7.2. He is concerned about the cost of taking Tradjenta. He is followed by Dr. Elyse Hsu.   He was recently seen in the emergency room and he can hospital. There he presented with hypotension and was given 1 L normal saline and his symptoms improved. He had been having dizziness as well as some upper back pain into the left shoulder blade with exertion recently. The symptoms are worse particularly when climbing up ladders or going up stairs and are relieved at rest. After that hospitalization it was recommended that he discontinue his ACE inhibitor and beta blocker, and his blood pressure appears improved today up to 110/60.  [name] [name] returns today for followup of his stress test. This is interpreted as nonischemic with an EF of 44%. He reports still feeling very fatigued and extremely short of breath when doing activities. He says that the other day he tried to hit golf balls and felt that he was totally exhausted after just an hour. He reports his blood pressure has improved somewhat off of all medications  however remains low. He has had symptoms of orthostatic hypotension in the past. His symptoms do feel similar to prior to his bypass surgery. He also feels that he was much better after surgery than he is now and that his symptoms have been going on for the past 2 months.  At his last office visit, I recommended that [name] [name] have a repeat cardiac catheterization. This demonstrated the following:  Impression:  1. 2 vessel native CAD with occluded LAD in the mid-vessel  2. Patent LIMA-LAD, SVG to PLA and SVG to PDA grafts with TIMI III flow  3. LVEDP = 13 mmHg  4. Random PVC's were noted  Based on these findings, I could not find any new cardiac cause of his symptoms. I suspect that some of his fatigue could be related to labile blood sugars. He says unexplained hypotension but is normotensive off of medications.  [name] [name] returns today for followup. He now reports feeling better since he had change in his medications. He is established with Dr. Buddy Duty who adjusted his diabetes medicines and stopped his Invokana. His shortness of breath and fatigue in both improved. He is now been expected some problems with left heel pain. He was found to have some spinal stenosis but has had improvement with inserts in his shoes.  I saw [name] [name] back in the office today. He has successfully underwent back surgery earlier this year which she says initially was much improved, however he says he subsequently developed more pain going down his legs. This sounds like it's  neuropathic pain, but is reluctant to try medication for it. He also significantly fatigue. This could be due to a number of etiologies. 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 Also, he is noted to have a history of obstructive sleep apnea. He was placed on BiPAP, but has not been compliant with that due to difficulty wearing the mask. He also  never had follow-up of his sleep study.  PMHx:  Past Medical History  Diagnosis Date  . Diabetes mellitus   . Coronary artery disease   . High cholesterol   . Ischemic cardiomyopathy   . CAD (coronary artery disease) 07/06/2011  . LV dysfunction 07/06/2011  . Stroke [facility])     [place]   . CHF (congestive heart failure) ([place])   . Arthritis     Knee L - probably  . 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 Ellison Hughs  . BPH (benign prostatic hyperplasia)   . History of nuclear stress test 09/2010    dipyridamole; mild perfusion defect due to attenuation with mild superimposed ischemia in apical septal, apical, apical inferior & apical lateral regions; rest LV enlarged in size; prominent gut uptake in infero-apical region; no significant ischemia demonstrated; low risk scan   . Right bundle branch block   . Spinal stenosis of lumbar region   . HNP (herniated nucleus pulposus), lumbar   . Nocturia   . Frequency of urination   . Sleep apnea     sleep study 10/2010- AHI during total sleep 32.1/hr and during REM 62.3/hr (severe sleep apnea)unable to tolerate c pap    Past Surgical History  Procedure Laterality Date  . Cardiac catheterization  01/2011    ischemic cardiomyopathy 30-35%, multivessel CAD (Dr. [name])   . Pilonidal cyst removed    . Colonscopy    . Transthoracic echocardiogram  11/10/2012    EF 40-45%, mild LVH, mild hypokinesis of anteroseptal myocardium, grade 1 diastolic dysfunction; mild MR & calcifed mitral annulus; LA mild-mod dilated; RA mildly dilated  . Lower extremity arterial doppler  03/16/2013    bilat ABIs demonstated normal values; R runoff - posterior tibial & anterior tibial arteries occluded; L runoff - peroneal & posterior tibial arteries occluded, anterior tibial artery appears occluded  . Left heart catheterization with coronary/graft angiogram N/A 09/20/2013    Procedure: LEFT  HEART CATHETERIZATION WITH [name];  Surgeon: [name], MD;  Location: [facility] CATH LAB;  Service: Cardiovascular;  Laterality: N/A;  . Coronary artery bypass graft  08/03/2011    Procedure: CORONARY ARTERY BYPASS GRAFTING (CABG);  Surgeon: [name], MD;  Location: [place];  Service: Open Heart Surgery;  Laterality: N/A;  CABG times three using right saphenous vein and left mammary artery usisng endoscope.  . Lumbar laminectomy/decompression microdiscectomy N/A 09/12/2014    Procedure: LUMBAR DECOMPRESSION L3-L4, L4-L5, MICRODISCECTOMY L4-L5;  Surgeon: [name], MD;  Location: [facility];  Service: Orthopedics;  Laterality: N/A;    FAMHx:  Family History  Problem Relation Age of Onset  . Anesthesia problems Daughter   . Cancer Mother   . Lung disease Father     & heart disease  . Colon cancer Sister   . Sudden death Maternal Grandmother     SOCHx:   reports that he quit smoking about 3 years ago.  His smoking use included Cigars. He quit smokeless tobacco use about 3 years ago. He reports that he drinks alcohol. He reports that he does not use illicit drugs.  ALLERGIES:  No Known Allergies  ROS: A comprehensive review of systems was negative except for: Constitutional: positive for fatigue Musculoskeletal: positive for back pain Neurological: positive for weakness  HOME MEDS: Current Outpatient Prescriptions  Medication Sig Dispense Refill  . aspirin EC 81 MG tablet Take 81 mg by mouth daily.    . Cholecalciferol (VITAMIN D3) 5000 UNITS CAPS Take 1 capsule by mouth daily.    Marland Kitchen docusate sodium (COLACE) 100 MG capsule Take 1 capsule (100 mg total) by mouth 2 (two) times daily as needed for mild constipation. 20 capsule 1  . glipiZIDE (GLUCOTROL XL) 10 MG 24 hr tablet Take 10 mg by mouth every morning.     . metFORMIN (GLUMETZA) 500 MG (MOD) 24 hr tablet Take 500 mg by mouth daily with breakfast.    . polycarbophil (FIBERCON) 625 MG tablet Take 625 mg by mouth  daily.    . rosuvastatin (CRESTOR) 20 MG tablet Take 10 mg by mouth daily.     Marland Kitchen spironolactone (ALDACTONE) 25 MG tablet Take 0.5 tablets (12.5 mg total) by mouth daily. 15 tablet 9  . tamsulosin (FLOMAX) 0.4 MG CAPS capsule Take 1 capsule by mouth daily.     No current facility-administered medications for this visit.    LABS/IMAGING: No results found for this or any previous visit (from the past 48 hour(s)). No results found.  VITALS: BP 134/82 mmHg  Pulse 87  Ht 6\' 2"  (1.88 m)  Wt 255 lb 6.4 oz (115.849 kg)  BMI 32.78 kg/m2  EXAM: General appearance: alert and no distress Neck: no carotid bruit and no JVD Lungs: clear to auscultation bilaterally Heart: regular rate and rhythm, S1, S2 normal, no murmur, click, rub or gallop Abdomen: soft, non-tender; bowel sounds normal; no masses,  no organomegaly Extremities: extremities normal, atraumatic, no cyanosis or edema Pulses: 2+ bilateral DP, 2+ L PT, 1+ R PT Skin: Skin color, texture, turgor normal. No rashes or lesions Neurologic: Grossly normal Psych: Pleasant  EKG: Sinus rhythm with first-degree AV block, bifascicular block at 87  ASSESSMENT: 1. Coronary artery disease status post three-vessel CABG in 2013 (LIMA to LAD, SVG to PDA and SVG to PLA) 2. Hypertension-controlled 3. Dyslipidemia 4. Diabetes type 2 - better controlled 5. Mild PVD-with normal ABI 6. Hypotension/upper back pain - resolved 7. Spinal stenosis - s/p surgery 8. Trifascicular block 9. OSA-noncompliant with BiPAP  PLAN: 1.   [name] [name] again is having complaints of fatigue and some shortness of breath. Previously this was thought to be due to a diabetes medicine which was discontinued and his symptoms improved, but then they came back. Diabetes is not been well controlled and he is not followed up with his endocrinologist. He is is not a "fan" of taking medicines, by his account. He also has a history of OSA, but has been noncompliant with BiPAP.  Nontreatment of this may be why he is fatigued and I encouraged him to get another sleep study. 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 The cardiovascular risk at this point may be minimal and he should follow-up with his primary care provider about possible additional testing.  Plan to see him back in 6 months.  [name], MD, [facility] Attending Cardiologist [place] 04/17/2015, 2:59 PM

## 2015-04-23 DIAGNOSIS — R201 Hypoesthesia of skin: Secondary | ICD-10-CM | POA: Diagnosis not present

## 2015-04-23 DIAGNOSIS — M791 Myalgia: Secondary | ICD-10-CM | POA: Diagnosis not present

## 2015-04-23 DIAGNOSIS — M9906 Segmental and somatic dysfunction of lower extremity: Secondary | ICD-10-CM | POA: Diagnosis not present

## 2015-04-23 DIAGNOSIS — G5732 Lesion of lateral popliteal nerve, left lower limb: Secondary | ICD-10-CM | POA: Diagnosis not present

## 2015-04-23 DIAGNOSIS — M21372 Foot drop, left foot: Secondary | ICD-10-CM | POA: Diagnosis not present

## 2015-04-23 DIAGNOSIS — M25672 Stiffness of left ankle, not elsewhere classified: Secondary | ICD-10-CM | POA: Diagnosis not present

## 2015-04-25 DIAGNOSIS — J0101 Acute recurrent maxillary sinusitis: Secondary | ICD-10-CM | POA: Diagnosis not present

## 2015-04-26 NOTE — Patient Outreach (Signed)
[facility]) Care Management  04/26/2015  [name] [name] 1946-03-04 [id number]   No response from patient after 3 outreach calls and letter.  Plan: RN Health Coach will forward patient information to Lurline Del for case closure.    Jone Baseman, RN, MSN [place] [phone]

## 2015-05-01 ENCOUNTER — Encounter: Payer: Self-pay | Admitting: Internal Medicine

## 2015-05-02 NOTE — Patient Outreach (Signed)
[facility]) Care Management  05/02/2015  [name] [name] [birth year] [id number]   Request from Jon Billings, RN to close case due to unable to contact patient for [place] Management services.  Thanks, Ronnell Freshwater. [place], [place] Assistant Phone: [phone] Fax: [phone]

## 2015-05-22 ENCOUNTER — Ambulatory Visit (HOSPITAL_BASED_OUTPATIENT_CLINIC_OR_DEPARTMENT_OTHER): Payer: Medicare Other | Attending: Internal Medicine

## 2015-05-22 VITALS — Ht 74.0 in | Wt 245.0 lb

## 2015-05-22 DIAGNOSIS — E119 Type 2 diabetes mellitus without complications: Secondary | ICD-10-CM | POA: Insufficient documentation

## 2015-05-22 DIAGNOSIS — G4736 Sleep related hypoventilation in conditions classified elsewhere: Secondary | ICD-10-CM | POA: Diagnosis not present

## 2015-05-22 DIAGNOSIS — R5383 Other fatigue: Secondary | ICD-10-CM | POA: Insufficient documentation

## 2015-05-22 DIAGNOSIS — G4737 Central sleep apnea in conditions classified elsewhere: Secondary | ICD-10-CM | POA: Diagnosis not present

## 2015-05-22 DIAGNOSIS — R0683 Snoring: Secondary | ICD-10-CM | POA: Diagnosis not present

## 2015-05-22 DIAGNOSIS — G4730 Sleep apnea, unspecified: Secondary | ICD-10-CM

## 2015-05-22 DIAGNOSIS — G4733 Obstructive sleep apnea (adult) (pediatric): Secondary | ICD-10-CM

## 2015-05-27 ENCOUNTER — Telehealth: Payer: Self-pay | Admitting: Cardiology

## 2015-05-27 DIAGNOSIS — G4733 Obstructive sleep apnea (adult) (pediatric): Secondary | ICD-10-CM

## 2015-05-27 NOTE — Sleep Study (Signed)
   Patient Name: [name] [name], [name] [name] MRN: [id number] Study Date: 05/22/2015 Gender: Male D.O.B: 09-01-1945 Age (years): 10 Referring Provider: [name] Interpreting Physician: Fransico Him MD, ABSM RPSGT: Baxter Flattery  Height (inches): 72 Weight (lbs): 245 BMI: 33 Neck Size: 18.50  CLINICAL INFORMATION Sleep Study Type: NPSG Indication for sleep study: Diabetes, Fatigue, Snoring, Witnesses Apnea / Gasping During Sleep Epworth Sleepiness Score: 7  SLEEP STUDY TECHNIQUE As per the AASM Manual for the Scoring of Sleep and Associated Events v2.3 (April 2016) with a hypopnea requiring 4% desaturations. The channels recorded and monitored were frontal, central and occipital EEG, electrooculogram (EOG), submentalis EMG (chin), nasal and oral airflow, thoracic and abdominal wall motion, anterior tibialis EMG, snore microphone, electrocardiogram, and pulse oximetry.  MEDICATIONS Patient's medications include: ASA, Vit D3, Colace, Glucotrol, Metformin, Crestor, Aldactone, Flomax. Medications self-administered by patient during sleep study : No sleep medicine administered.  SLEEP ARCHITECTURE The study was initiated at 9:55:58 PM and ended at 4:35:24 AM. Sleep onset time was 1.8 minutes and the sleep efficiency was reduced at 63.6%. The total sleep time was 254.0 minutes. Stage REM latency was prolonged at 245.5 minutes. The patient spent 10.83% of the night in stage N1 sleep, 76.18% in stage N2 sleep, 0.00% in stage N3 and 12.99% in REM. Alpha intrusion was absent. Supine sleep was 52.56%.  RESPIRATORY PARAMETERS The overall apnea/hypopnea index (AHI) was 59.5 per hour. There were 107 total apneas, including 27 obstructive, 44 central and 36 mixed apneas. There were 145 hypopneas and 0 RERAs. The AHI during Stage REM sleep was 43.6 per hour. AHI while supine was 62.5 per hour. The mean oxygen saturation was 94.30%. The minimum SpO2 during sleep was 82.00%. Loud snoring was noted  during this study.  CARDIAC DATA The 2 lead EKG demonstrated sinus rhythm. The mean heart rate was 73.43 beats per minute. Other EKG findings include: PVC's and PAC's.  LEG MOVEMENT DATA The total PLMS were 2 with a resulting PLMS index of 0.47. Associated arousal with leg movement index was 0.0 .  IMPRESSIONS - Severe obstructive sleep apnea occurred during this study (AHI = 59.5/h). - Mild central sleep apnea occurred during this study (CAI = 10.4/h). - Mild oxygen desaturation was noted during this study (Min O2 = 82.00%). - The patient snored with Loud snoring volume. - No cardiac abnormalities were noted during this study. - Clinically significant periodic limb movements did not occur during sleep. No significant associated arousals.  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  - Avoid alcohol, sedatives and other CNS depressants that may worsen sleep apnea and disrupt normal sleep architecture. - Sleep hygiene should be reviewed to assess factors that may improve sleep quality. - Weight management and regular exercise should be initiated or continued if appropriate.    [place], American Board of Sleep Medicine  ELECTRONICALLY SIGNED ON:  05/27/2015, 1:22 PM [place] PH: [phone]   FX: [phone] [place]

## 2015-05-27 NOTE — Telephone Encounter (Signed)
Please let patient know that they have sleep apnea and recommend CPAP titration. Please set up titration in the sleep lab ASAP due to severity and nocturnal hypoxia.

## 2015-05-28 DIAGNOSIS — R201 Hypoesthesia of skin: Secondary | ICD-10-CM | POA: Diagnosis not present

## 2015-05-28 DIAGNOSIS — G5732 Lesion of lateral popliteal nerve, left lower limb: Secondary | ICD-10-CM | POA: Diagnosis not present

## 2015-05-28 DIAGNOSIS — M21372 Foot drop, left foot: Secondary | ICD-10-CM | POA: Diagnosis not present

## 2015-05-28 DIAGNOSIS — M9906 Segmental and somatic dysfunction of lower extremity: Secondary | ICD-10-CM | POA: Diagnosis not present

## 2015-05-28 DIAGNOSIS — M25672 Stiffness of left ankle, not elsewhere classified: Secondary | ICD-10-CM | POA: Diagnosis not present

## 2015-05-28 DIAGNOSIS — M791 Myalgia: Secondary | ICD-10-CM | POA: Diagnosis not present

## 2015-05-30 NOTE — Telephone Encounter (Signed)
Informed patient of results.  Stated verbal understanding. Will schedule titration and mail patient a letter.

## 2015-05-30 NOTE — Addendum Note (Signed)
Addended by: Andres Ege on: 05/30/2015 02:55 PM   Modules accepted: Orders

## 2015-05-31 ENCOUNTER — Encounter: Payer: Self-pay | Admitting: *Deleted

## 2015-06-25 DIAGNOSIS — G5732 Lesion of lateral popliteal nerve, left lower limb: Secondary | ICD-10-CM | POA: Diagnosis not present

## 2015-06-25 DIAGNOSIS — M791 Myalgia: Secondary | ICD-10-CM | POA: Diagnosis not present

## 2015-06-25 DIAGNOSIS — R201 Hypoesthesia of skin: Secondary | ICD-10-CM | POA: Diagnosis not present

## 2015-06-25 DIAGNOSIS — M9906 Segmental and somatic dysfunction of lower extremity: Secondary | ICD-10-CM | POA: Diagnosis not present

## 2015-06-25 DIAGNOSIS — M25672 Stiffness of left ankle, not elsewhere classified: Secondary | ICD-10-CM | POA: Diagnosis not present

## 2015-06-25 DIAGNOSIS — M21372 Foot drop, left foot: Secondary | ICD-10-CM | POA: Diagnosis not present

## 2015-07-12 DIAGNOSIS — E1140 Type 2 diabetes mellitus with diabetic neuropathy, unspecified: Secondary | ICD-10-CM | POA: Diagnosis not present

## 2015-07-12 DIAGNOSIS — E7800 Pure hypercholesterolemia, unspecified: Secondary | ICD-10-CM | POA: Diagnosis not present

## 2015-07-12 DIAGNOSIS — I5022 Chronic systolic (congestive) heart failure: Secondary | ICD-10-CM | POA: Diagnosis not present

## 2015-07-12 DIAGNOSIS — I10 Essential (primary) hypertension: Secondary | ICD-10-CM | POA: Diagnosis not present

## 2015-07-15 DIAGNOSIS — E7800 Pure hypercholesterolemia, unspecified: Secondary | ICD-10-CM | POA: Diagnosis not present

## 2015-07-22 DIAGNOSIS — N401 Enlarged prostate with lower urinary tract symptoms: Secondary | ICD-10-CM | POA: Diagnosis not present

## 2015-07-22 DIAGNOSIS — E1140 Type 2 diabetes mellitus with diabetic neuropathy, unspecified: Secondary | ICD-10-CM | POA: Diagnosis not present

## 2015-07-22 DIAGNOSIS — I5022 Chronic systolic (congestive) heart failure: Secondary | ICD-10-CM | POA: Diagnosis not present

## 2015-07-22 DIAGNOSIS — M5432 Sciatica, left side: Secondary | ICD-10-CM | POA: Diagnosis not present

## 2015-07-22 DIAGNOSIS — I10 Essential (primary) hypertension: Secondary | ICD-10-CM | POA: Diagnosis not present

## 2015-07-22 DIAGNOSIS — I255 Ischemic cardiomyopathy: Secondary | ICD-10-CM | POA: Diagnosis not present

## 2015-07-22 DIAGNOSIS — E1165 Type 2 diabetes mellitus with hyperglycemia: Secondary | ICD-10-CM | POA: Diagnosis not present

## 2015-07-22 DIAGNOSIS — K5901 Slow transit constipation: Secondary | ICD-10-CM | POA: Diagnosis not present

## 2015-07-28 ENCOUNTER — Ambulatory Visit (HOSPITAL_BASED_OUTPATIENT_CLINIC_OR_DEPARTMENT_OTHER): Payer: Medicare Other | Attending: Cardiology

## 2015-07-28 DIAGNOSIS — Z7984 Long term (current) use of oral hypoglycemic drugs: Secondary | ICD-10-CM | POA: Diagnosis not present

## 2015-07-28 DIAGNOSIS — G4733 Obstructive sleep apnea (adult) (pediatric): Secondary | ICD-10-CM | POA: Insufficient documentation

## 2015-07-28 DIAGNOSIS — I493 Ventricular premature depolarization: Secondary | ICD-10-CM | POA: Insufficient documentation

## 2015-07-28 DIAGNOSIS — Z79899 Other long term (current) drug therapy: Secondary | ICD-10-CM | POA: Insufficient documentation

## 2015-07-28 DIAGNOSIS — R0683 Snoring: Secondary | ICD-10-CM | POA: Diagnosis not present

## 2015-07-28 DIAGNOSIS — G4730 Sleep apnea, unspecified: Secondary | ICD-10-CM | POA: Diagnosis present

## 2015-07-31 ENCOUNTER — Telehealth: Payer: Self-pay | Admitting: Cardiology

## 2015-07-31 NOTE — Sleep Study (Signed)
   Patient Name: [name] [name], [name] [name] MRN: [id number] Study Date: 07/28/2015 Gender: Male D.O.B: 09/14/1945 Age (years): 61 Referring Provider: [name] Interpreting Physician: Fransico Him MD, ABSM RPSGT: Madelon Lips  Height (inches): 74 BMI: 31 Weight (lbs): 245 Neck Size: 18.50  CLINICAL INFORMATION The patient is referred for a CPAP titration to treat sleep apnea. Date of NPSG, Split Night or HST:05/27/2015  SLEEP STUDY TECHNIQUE As per the AASM Manual for the Scoring of Sleep and Associated Events v2.3 (April 2016) with a hypopnea requiring 4% desaturations. The channels recorded and monitored were frontal, central and occipital EEG, electrooculogram (EOG), submentalis EMG (chin), nasal and oral airflow, thoracic and abdominal wall motion, anterior tibialis EMG, snore microphone, electrocardiogram, and pulse oximetry. Continuous positive airway pressure (CPAP) was initiated at the beginning of the study and titrated to treat sleep-disordered breathing.  MEDICATIONS Medications taken by the patient : ASA, Vit D3, Colace, Glipizide, Metformin, Crestor, Aldactone, Flomax. Medications administered by patient during sleep study : No sleep medicine administered.  TECHNICIAN COMMENTS Comments added by technician: Patient had difficulty initiating sleep. Patient had more than two awakenings to use the bathroom  Comments added by scorer: N/A  RESPIRATORY PARAMETERS Optimal PAP Pressure (cm): 18  AHI at Optimal Pressure (/hr):4.7 Overall Minimal O2 (%):85.00   Supine % at Optimal Pressure (%):100 Minimal O2 at Optimal Pressure (%):93.0    SLEEP ARCHITECTURE The study was initiated at 10:53:22 PM and ended at 4:57:07 AM. Sleep onset time was 0.7 minutes and the sleep efficiency was reduced at 60.8%. The total sleep time was 221.0 minutes. The patient spent 26.24% of the night in stage N1 sleep, 46.38% in stage N2 sleep, 0.00% in stage N3 and 27.38% in REM.Stage REM latency was  154.5 minutes Wake after sleep onset was 142.1. Alpha intrusion was absent. Supine sleep was 87.78%.  CARDIAC DATA The 2 lead EKG demonstrated sinus rhythm. The mean heart rate was 70.76 beats per minute. Other EKG findings include: PVCs.  LEG MOVEMENT DATA The total Periodic Limb Movements of Sleep (PLMS) were 204. The PLMS index was 55.38. A PLMS index of <15 is considered normal in adults.  IMPRESSIONS - The optimal PAP pressure was 18 cm of water. - Moderate Central Sleep Apnea was noted during this titration (CAI = 19.0/h). - Moderate oxygen desaturations were observed during this titration (min O2 = 85.00%). - The patient snored with Loud snoring volume during this titration study. 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 Arousals associated with PLMs were rare.  DIAGNOSIS - Obstructive Sleep Apnea (327.23 [G47.33 ICD-10])  RECOMMENDATIONS - Trial of CPAP therapy on 18 cm H2O with a Large size Fisher&Paykel Full Face Mask Simplus mask and heated humidification. - Avoid alcohol, sedatives and other CNS depressants that may worsen sleep apnea and disrupt normal sleep architecture. - Sleep hygiene should be reviewed to assess factors that may improve sleep quality. - Weight management and regular exercise should be initiated or continued. - Return to Sleep Center for re-evaluation after 10 weeks of therapy  [place], American Board of Sleep Medicine  ELECTRONICALLY SIGNED ON:  07/31/2015, 4:31 PM [place] PH: [phone]   FX: [phone] [place]

## 2015-07-31 NOTE — Telephone Encounter (Signed)
Pt had successful PAP titration. Please setup appointment in 10 weeks. Please let [facility] know that order for PAP is in EPIC.   

## 2015-07-31 NOTE — Addendum Note (Signed)
Addended by: Sueanne Margarita on: 07/31/2015 04:38 PM   Modules accepted: Orders

## 2015-08-01 DIAGNOSIS — M9906 Segmental and somatic dysfunction of lower extremity: Secondary | ICD-10-CM | POA: Diagnosis not present

## 2015-08-01 DIAGNOSIS — M21372 Foot drop, left foot: Secondary | ICD-10-CM | POA: Diagnosis not present

## 2015-08-01 DIAGNOSIS — R201 Hypoesthesia of skin: Secondary | ICD-10-CM | POA: Diagnosis not present

## 2015-08-01 DIAGNOSIS — M791 Myalgia: Secondary | ICD-10-CM | POA: Diagnosis not present

## 2015-08-01 DIAGNOSIS — G5732 Lesion of lateral popliteal nerve, left lower limb: Secondary | ICD-10-CM | POA: Diagnosis not present

## 2015-08-01 DIAGNOSIS — M25672 Stiffness of left ankle, not elsewhere classified: Secondary | ICD-10-CM | POA: Diagnosis not present

## 2015-08-02 NOTE — Telephone Encounter (Signed)
Patient informed of results.  [place] notified of orders. Once he has started therapy, I will schedule 10 week follow-up

## 2015-08-08 ENCOUNTER — Telehealth: Payer: Self-pay | Admitting: Internal Medicine

## 2015-08-08 ENCOUNTER — Telehealth: Payer: Self-pay | Admitting: *Deleted

## 2015-08-08 DIAGNOSIS — G4733 Obstructive sleep apnea (adult) (pediatric): Secondary | ICD-10-CM

## 2015-08-08 NOTE — Telephone Encounter (Signed)
Sleep lab did not titrated him on BiPAP - he titrated to a pretty high CPAP pressure.  Please set up to go back for a second night at sleep  Lab for BIPAP titration to see if we can lower pressure

## 2015-08-08 NOTE — Telephone Encounter (Signed)
Given the fact that he has significant sleep apnea with a large component of central sleep apnea, this needs to be done in the lab since he may need Bipap S/T.  I have not seen him before and do not feel comfortable doing a home study since he has complex OSA.

## 2015-08-08 NOTE — Telephone Encounter (Signed)
LOC: 12 mins  Pt's daughter called back. Apparently [facility] has received CPAP orders and no issues there ---  However, daughter mainly had concern that they "were moving backwards" as far as the recommendations for CPAP go, she explains pt had been on BiPAP in past and had some success w/ this.  She further clarifies that patient had a bad experience with last CPAP equipment provider and this had been part of his rationale for discontinuing use. Pt had not followed up on recommendations for equipment refit, etc, as he had been dissatisfied with the equipment provider or personnel. Caller continued to press issue of wanting to have patient on BiPAP vs CPAP.  I explained to her that I would not be able to answer her questions with adequate knowledge and would have to refer to reading physician for recommendations as related to sleep study, rationale for CPAP use, etc.  She is also requesting someone explain the results of sleep study to her or to patient - they never got a call w/ results summary.  Routed to Dr. Radford Pax

## 2015-08-08 NOTE — Telephone Encounter (Signed)
New message      Pt want sleep study results.  They know he needs a cpap machine. But, they want to know how severe his apnea is.  They want more details.  Please call

## 2015-08-08 NOTE — Telephone Encounter (Signed)
Left message for daughter to call back.  

## 2015-08-08 NOTE — Telephone Encounter (Signed)
Spoke with patient's daughter.  She stated that he has an auto BiPAP machine, and they really do not want to go through a BiPAP titration again.  They are wanting to just use the  Machine he has, so she is wondering if there is any way we can just set orders for the auto machine again.       He is not set up with a DME, so we will have to see who has the ability to do this type of order.  She is going to find out where the original machine came from and let me know.   I told her that I would see #1 If putting in an order for Auto Bipap is possible #2 if there is a company that can take the orders and just use his current machine so that insurance is not being filed again.

## 2015-08-08 NOTE — Telephone Encounter (Signed)
Returned call to patient and communicated that Sarita Haver sent orders on 2/3 to [facility]. Pt acknowledges he has been "playing phone tag" w/ someone from [facility] trying to set up his home equipment. Pt aware he needs to follow up with them. If they should need further clarification on orders, our office can provide this.  I have also called daughter and left communication verifying that the needed information should be in hand with [facility].

## 2015-08-09 NOTE — Telephone Encounter (Signed)
Spoke with daughter and she is understanding of the situation.  She stated that she would rather do what is best for her dad.  She asked that I call and talk with him just to get his "okay" on everything.  She stated that he is aware that this is probably what is going to happen, and since he is really wanting help she is pretty sure that he will agree to this treatment plan.  I let her know that I would call him and after I speak with him I will call her back to let her know the plan.

## 2015-08-09 NOTE — Addendum Note (Signed)
Addended by: Andres Ege on: 08/09/2015 01:08 PM   Modules accepted: Orders

## 2015-08-09 NOTE — Telephone Encounter (Signed)
Spoke with patient about the need for a Bipap Titration.  I explained the need for not "guessing" at numbers, and that we have his best interest in mind and want to treat him properly.  He was very understanding and has agreed to go back for a BiPAP titration.  I called the sleep lab to see how quickly I could get him in, and they do not have an opening until April 5th, however, I requested that he be added to the cancellation list as High Priority.  The sleep lab stated they would definitely call him when they have a cancellation.  Patient is aware of the date, and the fact that he is on the cancellation list.  He is willing to go in any sooner that they can get him in.   I have also called his daughter back to discuss this with her.

## 2015-08-09 NOTE — Telephone Encounter (Signed)
Left message for patient's daughter to call me back to discuss

## 2015-08-18 ENCOUNTER — Ambulatory Visit (HOSPITAL_BASED_OUTPATIENT_CLINIC_OR_DEPARTMENT_OTHER): Payer: Medicare Other | Attending: Cardiology

## 2015-08-18 VITALS — Ht 74.0 in | Wt 240.0 lb

## 2015-08-18 DIAGNOSIS — I493 Ventricular premature depolarization: Secondary | ICD-10-CM | POA: Diagnosis not present

## 2015-08-18 DIAGNOSIS — R0683 Snoring: Secondary | ICD-10-CM | POA: Insufficient documentation

## 2015-08-18 DIAGNOSIS — G4733 Obstructive sleep apnea (adult) (pediatric): Secondary | ICD-10-CM | POA: Diagnosis not present

## 2015-08-18 DIAGNOSIS — Z7984 Long term (current) use of oral hypoglycemic drugs: Secondary | ICD-10-CM | POA: Diagnosis not present

## 2015-08-18 DIAGNOSIS — G4730 Sleep apnea, unspecified: Secondary | ICD-10-CM | POA: Diagnosis present

## 2015-08-18 DIAGNOSIS — Z79899 Other long term (current) drug therapy: Secondary | ICD-10-CM | POA: Insufficient documentation

## 2015-08-19 ENCOUNTER — Telehealth: Payer: Self-pay | Admitting: Cardiology

## 2015-08-19 DIAGNOSIS — G4734 Idiopathic sleep related nonobstructive alveolar hypoventilation: Secondary | ICD-10-CM

## 2015-08-19 DIAGNOSIS — G4733 Obstructive sleep apnea (adult) (pediatric): Secondary | ICD-10-CM

## 2015-08-19 NOTE — Telephone Encounter (Signed)
Pt had successful PAP titration. Please setup appointment in 10 weeks. Please let [facility] know that order for PAP is in EPIC.  Please get a d/l from DME in 2 weeks and overnight pulse ox on BiPAP.

## 2015-08-19 NOTE — Addendum Note (Signed)
Addended by: Sueanne Margarita on: 08/19/2015 10:29 PM   Modules accepted: Orders

## 2015-08-19 NOTE — Sleep Study (Addendum)
   Patient Name: [name] [name], [name] [name] MRN: [id number] Study Date: 08/18/2015 Gender: Male D.O.B: [birth year] Age (years): 28 Referring Provider: [name] Interpreting Physician: Fransico Him MD, ABSM RPSGT: Madelon Lips  BMI: 31 Weight (lbs): 245 Neck Size: 18.50 Height (inches): 74  CLINICAL INFORMATION The patient is referred for a BiPAP titration to treat sleep apnea. Date of NPSG, Split Night or HST:07/31/2015  SLEEP STUDY TECHNIQUE As per the AASM Manual for the Scoring of Sleep and Associated Events v2.3 (April 2016) with a hypopnea requiring 4% desaturations. The channels recorded and monitored were frontal, central and occipital EEG, electrooculogram (EOG), submentalis EMG (chin), nasal and oral airflow, thoracic and abdominal wall motion, anterior tibialis EMG, snore microphone, electrocardiogram, and pulse oximetry. Bilevel positive airway pressure (BPAP) was initiated at the beginning of the study and titrated to treat sleep-disordered breathing.  MEDICATIONS Medications administered by patient during sleep study : No sleep medicine administered.  Home medications:  ASA, Vit D3, Glucotrol, Colace, Metformin, Crestor, Aldactone, Flomax.  RESPIRATORY PARAMETERS Optimal IPAP Pressure (cm): 13 AHI at Optimal Pressure (/hr) 4.9 Optimal EPAP Pressure (cm): 9     Overall Minimal O2 (%): 77.00 Minimal O2 at Optimal Pressure (%): 89.0  SLEEP ARCHITECTURE Start Time: 9:56:01 PM Stop Time: 4:09:58 AM Total Time (min): 373.9 Total Sleep Time (min): 285.5 Sleep Latency (min): 1.5 Sleep Efficiency (%): 76.3 REM Latency (min): 43.0 WASO (min): 87.0 Stage N1 (%): 24.34 Stage N2 (%): 54.29 Stage N3 (%): 0.88 Stage R (%): 20.49 Supine (%): 72.76 Arousal Index (/hr): 23.3      CARDIAC DATA The 2 lead EKG demonstrated sinus rhythm. The mean heart rate was 76.70 beats per minute. Other EKG findings include: PVCs.  LEG MOVEMENT DATA The total Periodic Limb Movements of Sleep (PLMS) were  0. The PLMS index was 0.00. A PLMS index of <15 is considered normal in adults.  IMPRESSIONS - An optimal PAP pressure was selected for this patient ( 13/9 cm of water) - Mild Central Sleep Apnea was noted during this titration (CAI = 14.7/h). - Severe oxygen desaturations were observed during this titration (min O2 = 77.00%).  Time spent with oxygen saturations < 88% on CPAP was 10.9 minutes.   - The patient snored with Loud snoring volume. - 2-lead EKG demonstrated: PVCs - Clinically significant periodic limb movements were not noted during this study. Arousals associated with PLMs were rare.  DIAGNOSIS - Obstructive Sleep Apnea (327.23 [G47.33 ICD-10]) - Nocturnal Hypoxemia  RECOMMENDATIONS - Trial of BiPAP therapy on 13/9 cm H2O with a Large size Fisher&Paykel Full Face Mask Simplus mask and heated humidification. - Avoid alcohol, sedatives and other CNS depressants that may worsen sleep apnea and disrupt normal sleep architecture. - Sleep hygiene should be reviewed to assess factors that may improve sleep quality. - Weight management and regular exercise should be initiated or continued. - Return to Sleep Center for re-evaluation after 10 weeks of therapy   [place], American Board of Sleep Medicine  ELECTRONICALLY SIGNED ON:  08/19/2015, 10:26 PM [place] PH: [phone]   FX: [phone] [place]

## 2015-08-20 ENCOUNTER — Emergency Department (HOSPITAL_COMMUNITY): Payer: Medicare Other

## 2015-08-20 ENCOUNTER — Encounter (HOSPITAL_COMMUNITY): Payer: Self-pay

## 2015-08-20 ENCOUNTER — Emergency Department (HOSPITAL_COMMUNITY)
Admission: EM | Admit: 2015-08-20 | Discharge: 2015-08-20 | Disposition: A | Discharge status: Home / Self Care | Payer: Medicare Other | Attending: Emergency Medicine | Admitting: Emergency Medicine

## 2015-08-20 ENCOUNTER — Telehealth: Payer: Self-pay | Admitting: Cardiology

## 2015-08-20 DIAGNOSIS — R0600 Dyspnea, unspecified: Secondary | ICD-10-CM | POA: Diagnosis not present

## 2015-08-20 DIAGNOSIS — Z79899 Other long term (current) drug therapy: Secondary | ICD-10-CM | POA: Diagnosis not present

## 2015-08-20 DIAGNOSIS — Z87891 Personal history of nicotine dependence: Secondary | ICD-10-CM | POA: Diagnosis not present

## 2015-08-20 DIAGNOSIS — G4730 Sleep apnea, unspecified: Secondary | ICD-10-CM | POA: Diagnosis not present

## 2015-08-20 DIAGNOSIS — M7989 Other specified soft tissue disorders: Secondary | ICD-10-CM | POA: Insufficient documentation

## 2015-08-20 DIAGNOSIS — N400 Enlarged prostate without lower urinary tract symptoms: Secondary | ICD-10-CM | POA: Insufficient documentation

## 2015-08-20 DIAGNOSIS — I2510 Atherosclerotic heart disease of native coronary artery without angina pectoris: Secondary | ICD-10-CM | POA: Insufficient documentation

## 2015-08-20 DIAGNOSIS — Z8673 Personal history of transient ischemic attack (TIA), and cerebral infarction without residual deficits: Secondary | ICD-10-CM | POA: Insufficient documentation

## 2015-08-20 DIAGNOSIS — E119 Type 2 diabetes mellitus without complications: Secondary | ICD-10-CM | POA: Diagnosis not present

## 2015-08-20 DIAGNOSIS — I509 Heart failure, unspecified: Secondary | ICD-10-CM | POA: Insufficient documentation

## 2015-08-20 DIAGNOSIS — M1990 Unspecified osteoarthritis, unspecified site: Secondary | ICD-10-CM | POA: Diagnosis not present

## 2015-08-20 DIAGNOSIS — Z7984 Long term (current) use of oral hypoglycemic drugs: Secondary | ICD-10-CM | POA: Diagnosis not present

## 2015-08-20 DIAGNOSIS — R0602 Shortness of breath: Secondary | ICD-10-CM | POA: Diagnosis not present

## 2015-08-20 DIAGNOSIS — Z951 Presence of aortocoronary bypass graft: Secondary | ICD-10-CM | POA: Insufficient documentation

## 2015-08-20 DIAGNOSIS — Z9889 Other specified postprocedural states: Secondary | ICD-10-CM | POA: Insufficient documentation

## 2015-08-20 DIAGNOSIS — E7800 Pure hypercholesterolemia, unspecified: Secondary | ICD-10-CM | POA: Diagnosis not present

## 2015-08-20 DIAGNOSIS — Z7982 Long term (current) use of aspirin: Secondary | ICD-10-CM | POA: Diagnosis not present

## 2015-08-20 LAB — URINALYSIS, ROUTINE W REFLEX MICROSCOPIC
BILIRUBIN URINE: NEGATIVE
GLUCOSE, UA: NEGATIVE mg/dL
Hgb urine dipstick: NEGATIVE
KETONES UR: NEGATIVE mg/dL
Leukocytes, UA: NEGATIVE
NITRITE: NEGATIVE
PH: 5 (ref 5.0–8.0)
Protein, ur: NEGATIVE mg/dL
Specific Gravity, Urine: 1.025 (ref 1.005–1.030)

## 2015-08-20 LAB — CBC WITH DIFFERENTIAL/PLATELET
Basophils Absolute: 0 10*3/uL (ref 0.0–0.1)
Basophils Relative: 1 %
EOS ABS: 0.2 10*3/uL (ref 0.0–0.7)
Eosinophils Relative: 4 %
HCT: 38.7 % — ABNORMAL LOW (ref 39.0–52.0)
Hemoglobin: 12.8 g/dL — ABNORMAL LOW (ref 13.0–17.0)
LYMPHS ABS: 1.4 10*3/uL (ref 0.7–4.0)
LYMPHS PCT: 24 %
MCH: 28.8 pg (ref 26.0–34.0)
MCHC: 33.1 g/dL (ref 30.0–36.0)
MCV: 87 fL (ref 78.0–100.0)
Monocytes Absolute: 0.6 10*3/uL (ref 0.1–1.0)
Monocytes Relative: 9 %
NEUTROS PCT: 62 %
Neutro Abs: 3.8 10*3/uL (ref 1.7–7.7)
Platelets: 198 10*3/uL (ref 150–400)
RBC: 4.45 MIL/uL (ref 4.22–5.81)
RDW: 13.6 % (ref 11.5–15.5)
WBC: 6 10*3/uL (ref 4.0–10.5)

## 2015-08-20 LAB — COMPREHENSIVE METABOLIC PANEL
ALT: 28 U/L (ref 17–63)
ANION GAP: 9 (ref 5–15)
AST: 26 U/L (ref 15–41)
Albumin: 3.8 g/dL (ref 3.5–5.0)
Alkaline Phosphatase: 115 U/L (ref 38–126)
BILIRUBIN TOTAL: 1.3 mg/dL — AB (ref 0.3–1.2)
BUN: 14 mg/dL (ref 6–20)
CO2: 24 mmol/L (ref 22–32)
Calcium: 8.4 mg/dL — ABNORMAL LOW (ref 8.9–10.3)
Chloride: 103 mmol/L (ref 101–111)
Creatinine, Ser: 0.81 mg/dL (ref 0.61–1.24)
GFR calc Af Amer: >60 mL/min (ref >60)
Glucose, Bld: 194 mg/dL — ABNORMAL HIGH (ref 65–99)
Potassium: 4.1 mmol/L (ref 3.5–5.1)
SODIUM: 136 mmol/L (ref 135–145)
TOTAL PROTEIN: 7.3 g/dL (ref 6.5–8.1)

## 2015-08-20 LAB — BRAIN NATRIURETIC PEPTIDE: B NATRIURETIC PEPTIDE 5: 337 pg/mL — AB (ref 0.0–100.0)

## 2015-08-20 LAB — D-DIMER, QUANTITATIVE: D-Dimer, Quant: 0.4 ug/mL-FEU (ref 0.00–0.50)

## 2015-08-20 LAB — TROPONIN I: Troponin I: <0.03 ng/mL (ref <0.031)

## 2015-08-20 NOTE — ED Notes (Signed)
Pt ambulated around nurses station without difficulty and o2 sats maintaining at 97%.

## 2015-08-20 NOTE — Telephone Encounter (Signed)
Spoke with patient and his daughter to let him know what is going on. Patient's daughter stated that he continues to have severe issues while trying to sleep at night.  She stated that has soon as he goes to sleep he starts gasping for air.   I have contacted [facility] to see how quickly we can either get him a machine or put the settings into his old machine so that he can at least sleep at night. Melissa with [facility] is going to check on this and have Jonni Sanger call me ASAP to see what can be done for the patient.

## 2015-08-20 NOTE — Telephone Encounter (Signed)
Please see previous phone note from Dr. Radford Pax.  BiPAP machine will be handled.

## 2015-08-20 NOTE — ED Notes (Signed)
Pt reports SOB x 1 month.  Reports history of CHF.  Also reports increased fatigue.  Also reports sleep apnea but isn't on a cpap machine.  Pt says he became more sob after a trial on cpap machine.  Reports had another trial on a bipap Sunday night but has not been using a bipap since then.  Reports abd distended and swelling in lower legs.

## 2015-08-20 NOTE — Addendum Note (Signed)
Addended by: Andres Ege on: 08/20/2015 09:45 AM   Modules accepted: Orders

## 2015-08-20 NOTE — Telephone Encounter (Signed)
New message      Talk to someone today regarding pt.  He completed his sleep study Sunday and still has not heard from anyone regarding getting him a cpap or bipap machine.  Pt is not sleeping ---in fact---when he tries to sleep, he wake up gasping for air.  Should he go to the ER to see if something else is going on?  Please call and give update on cpap/bipap machine

## 2015-08-20 NOTE — ED Provider Notes (Signed)
CSN: [id number]     Arrival date & time 08/20/15  1500 History   First MD Initiated Contact with Patient 08/20/15 1523     Chief Complaint  Patient presents with  . Shortness of Breath     (Consider location/radiation/quality/duration/timing/severity/associated sxs/prior Treatment) HPI Comments: Patient with history of CABG, ischemic cardiomyopathy, EF 45-50% presenting with 1 month history of progressive worsening shortness of breath that is worse at night and worse with lying flat. States he has a history of CHF but does not take diuretics any longer other than Spironolactone. Denies any chest pain, cough or fever. States he sometimes wakes up for air gasping for air. at night becomes dyspneic on exertion and has difficulty lying flat. He states he has gained about 6 pounds in the past 4 months. Endorses some leg swelling.  No chest pain, cough, fever. Patient states he came in today because he is tired of feeling short of breath and hasnt been sleeping well. He underwent a sleep study 2 days ago he confirms he has sleep apnea but this has not yet been treated. His daughter states he's having some trouble except that he does have sleep apnea, recently got his bipap equipment. Patient also with history of cardiac bypass in 2013, ischemic cardiomyopathy, diabetes.  The history is provided by the patient and a relative.    Past Medical History  Diagnosis Date  . Diabetes mellitus   . Coronary artery disease   . High cholesterol   . Ischemic cardiomyopathy   . CAD (coronary artery disease) 07/06/2011  . LV dysfunction 07/06/2011  . Stroke [facility])     [place]   . CHF (congestive heart failure) ([place])   . Arthritis     Knee L - probably  . 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 Ellison Hughs  . BPH (benign prostatic hyperplasia)   . History of nuclear stress test 09/2010    dipyridamole; mild perfusion defect due  to attenuation with mild superimposed ischemia in apical septal, apical, apical inferior & apical lateral regions; rest LV enlarged in size; prominent gut uptake in infero-apical region; no significant ischemia demonstrated; low risk scan   . Right bundle branch block   . Spinal stenosis of lumbar region   . HNP (herniated nucleus pulposus), lumbar   . Nocturia   . Frequency of urination   . Sleep apnea     sleep study 10/2010- AHI during total sleep 32.1/hr and during REM 62.3/hr (severe sleep apnea)unable to tolerate c pap   Past Surgical History  Procedure Laterality Date  . Cardiac catheterization  01/2011    ischemic cardiomyopathy 30-35%, multivessel CAD (Dr. [name])   . Pilonidal cyst removed    . Colonscopy    . Transthoracic echocardiogram  11/10/2012    EF 40-45%, mild LVH, mild hypokinesis of anteroseptal myocardium, grade 1 diastolic dysfunction; mild MR & calcifed mitral annulus; LA mild-mod dilated; RA mildly dilated  . Lower extremity arterial doppler  03/16/2013    bilat ABIs demonstated normal values; R runoff - posterior tibial & anterior tibial arteries occluded; L runoff - peroneal & posterior tibial arteries occluded, anterior tibial artery appears occluded  . Left heart catheterization with coronary/graft angiogram N/A 09/20/2013    Procedure: LEFT HEART CATHETERIZATION WITH [name];  Surgeon: [name], MD;  Location: [facility] CATH LAB;  Service: Cardiovascular;  Laterality: N/A;  .  Coronary artery bypass graft  08/03/2011    Procedure: CORONARY ARTERY BYPASS GRAFTING (CABG);  Surgeon: [name], MD;  Location: [place];  Service: Open Heart Surgery;  Laterality: N/A;  CABG times three using right saphenous vein and left mammary artery usisng endoscope.  . Lumbar laminectomy/decompression microdiscectomy N/A 09/12/2014    Procedure: LUMBAR DECOMPRESSION L3-L4, L4-L5, MICRODISCECTOMY L4-L5;  Surgeon: [name], MD;  Location: [facility] ORS;  Service:  Orthopedics;  Laterality: N/A;   Family History  Problem Relation Age of Onset  . Anesthesia problems Daughter   . Cancer Mother   . Lung disease Father     & heart disease  . Colon cancer Sister   . Sudden death Maternal Grandmother    Social History  Substance Use Topics  . Smoking status: Former Smoker -- 5 years    Types: Cigars    Quit date: 09/07/2011  . Smokeless tobacco: Former Systems developer    Quit date: 02/28/2012     Comment: while playing golf  . Alcohol Use: Yes     Comment: occasional    Review of Systems  Constitutional: Positive for activity change. Negative for fever and appetite change.  HENT: Negative for congestion and rhinorrhea.   Respiratory: Positive for shortness of breath. Negative for chest tightness.   Cardiovascular: Positive for leg swelling. Negative for chest pain.  Gastrointestinal: Negative for nausea, vomiting and abdominal pain.  Genitourinary: Negative for dysuria, urgency, hematuria and testicular pain.  Musculoskeletal: Negative for myalgias and arthralgias.  Skin: Negative for rash.  Neurological: Negative for dizziness, weakness, light-headedness and headaches.    A complete 10 system review of systems was obtained and all systems are negative except as noted in the HPI and PMH.    Allergies  Review of patient's allergies indicates no known allergies.  Home Medications   Prior to Admission medications   Medication Sig Start Date End Date Taking? Authorizing Provider  aspirin EC 81 MG tablet Take 81 mg by mouth daily.   Yes Historical Provider, MD  glipiZIDE (GLUCOTROL XL) 10 MG 24 hr tablet Take 10 mg by mouth every morning.    Yes Historical Provider, MD  metFORMIN (GLUCOPHAGE-XR) 500 MG 24 hr tablet Take 1,000 mg by mouth 2 (two) times daily. 07/05/15  Yes Historical Provider, MD  polycarbophil (FIBERCON) 625 MG tablet Take 625 mg by mouth as needed for mild constipation or moderate constipation.    Yes Historical Provider, MD   rosuvastatin (CRESTOR) 20 MG tablet Take 10 mg by mouth every evening.    Yes Historical Provider, MD  spironolactone (ALDACTONE) 25 MG tablet Take 0.5 tablets (12.5 mg total) by mouth daily. 12/18/14  Yes [name], MD  tamsulosin (FLOMAX) 0.4 MG CAPS capsule Take 1 capsule by mouth every evening.  03/25/15  Yes Historical Provider, MD  TRADJENTA 5 MG TABS tablet Take 5 mg by mouth daily. 08/02/15  Yes Historical Provider, MD   BP 133/82 mmHg  Pulse 86  Temp(Src) 97.7 F (36.5 C) (Oral)  Resp 15  Ht 6\' 2"  (1.88 m)  Wt 245 lb (111.131 kg)  BMI 31.44 kg/m2  SpO2 97% Physical Exam  Constitutional: He is oriented to person, place, and time. He appears well-developed and well-nourished. No distress.  No dyspnea with conversation  HENT:  Head: Normocephalic and atraumatic.  Mouth/Throat: Oropharynx is clear and moist. No oropharyngeal exudate.  Eyes: Conjunctivae and EOM are normal. Pupils are equal, round, and reactive to light.  Neck: Normal range  of motion. Neck supple.  No meningismus.  Cardiovascular: Normal rate, regular rhythm, normal heart sounds and intact distal pulses.   No murmur heard. Pulmonary/Chest: Effort normal and breath sounds normal. No respiratory distress. He exhibits no tenderness.  Abdominal: Soft. There is no tenderness. There is no rebound and no guarding.  Musculoskeletal: Normal range of motion. He exhibits edema. He exhibits no tenderness.  +1 pretibial edema  Neurological: He is alert and oriented to person, place, and time. No cranial nerve deficit. He exhibits normal muscle tone. Coordination normal.  No ataxia on finger to nose bilaterally. No pronator drift. 5/5 strength throughout. CN 2-12 intact.Equal grip strength. Sensation intact.   Skin: Skin is warm.  Psychiatric: He has a normal mood and affect. His behavior is normal.  Nursing note and vitals reviewed.   ED Course  Procedures (including critical care time) Labs Review Labs Reviewed   CBC WITH DIFFERENTIAL/PLATELET - Abnormal; Notable for the following:    Hemoglobin 12.8 (*)    HCT 38.7 (*)    All other components within normal limits  COMPREHENSIVE METABOLIC PANEL - Abnormal; Notable for the following:    Glucose, Bld 194 (*)    Calcium 8.4 (*)    Total Bilirubin 1.3 (*)    All other components within normal limits  BRAIN NATRIURETIC PEPTIDE - Abnormal; Notable for the following:    B Natriuretic Peptide 337.0 (*)    All other components within normal limits  TROPONIN I  URINALYSIS, ROUTINE W REFLEX MICROSCOPIC (NOT AT [facility])  D-DIMER, QUANTITATIVE (NOT AT [facility])  TROPONIN I    Imaging Review Dg Chest 2 View  08/20/2015  CLINICAL DATA:  Short of breath for 4 weeks EXAM: CHEST  2 VIEW COMPARISON:  09/15/2013 FINDINGS: Borderline cardiomegaly. Normal vascularity. Clear lungs. Postoperative changes. No pneumothorax or pleural effusion. IMPRESSION: No active cardiopulmonary disease. Electronically Signed   By: Marybelle Killings M.D.   On: 08/20/2015 16:01   I have personally reviewed and evaluated these images and lab results as part of my medical decision-making.   EKG Interpretation   Date/Time:  Tuesday August 20 2015 15:20:13 EST Ventricular Rate:  91 PR Interval:  222 QRS Duration: 150 QT Interval:  418 QTC Calculation: 514 R Axis:   -64 Text Interpretation:  Sinus rhythm Ventricular premature complex Prolonged  PR interval RBBB and LAFB No significant change was found Confirmed by  Wyvonnia Dusky  MD, Bobbiejo Ishikawa [phone]) on 08/20/2015 3:42:01 PM      MDM   Final diagnoses:  Dyspnea    1 month history of progressively worsening shortness of breath, worse at night and worse with lying flat. Patient no distress. No hypoxia on room air. No chest pain. Confirmed to have sleep apnea on testing this past weekend.  EKG is unchanged. Lungs are clear. Chest x-rays negative. BNP 337. Last EF 40-45% in 2014.  Troponin negative. D-dimer negative. Echocardiogram was  attempted but they are gone for the day.  Ambulatory without desaturation. Maintain saturation 97% on room air. Workup was unremarkable and reassuring. No evidence of CHF. Doubt ACS equivalent given lack of chest pain. We'll check second troponin. Suspect his dyspnea is likely secondary to his newly diagnosed sleep apnea.  Troponin negative. X2.  Patient feels well. He is ambulatory without desaturation.  Doubt ACS.  Does not appear to have CHF exacerbation.  D-dimer negative, doubt PE. Stressed importance of compliance with Cpap. Follow up with cardiologist for repeat echo and recheck. Return precautions discussed.  Annie Main  Rafay Dahan, MD 08/21/15 RN:[id number]

## 2015-08-20 NOTE — Telephone Encounter (Signed)
Patient's daughter says that her dad is still very anxious, very exhausted but can't sleep. He keeps saying that he is not getting enough air. He said that he feels very "bloated" like his stomach almost burns. He does not feel like eating or drinking because he feels bloated. He is not urinating frequently.  He is telling his daughter that he is scared that he has fluid on him again. He keeps saying that he is just not doing well. Patient is questioning if he should go to the ED to make sure everything is okay or if getting on his BiPAP machine will help.  Instructed daughter that I would reach out to our DOD for advise and call her back.

## 2015-08-20 NOTE — Telephone Encounter (Signed)
Spoke with daughter again to confirm that if the patient continues to say that he is not well and feels like he needs to be checked out.   She is going to go get him and take him to the hospital just to make sure all is well.  She is also going to call [facility] and see about setting up a time for them to go by and put the pressure settings in his old machine. She is aware of our advice and handling the situation from here.

## 2015-08-20 NOTE — Discharge Instructions (Signed)
Shortness of Breath Your workup today is reassuring. There is no evidence of heart attack, blood clot in the lung, pneumonia or heart failure. Follow-up with Dr. Debara Pickett for repeat echocardiogram. Wear your C Pap as instructed. Return to the ED if you develop new or worsening symptoms. Shortness of breath means you have trouble breathing. It could also mean that you have a medical problem. You should get immediate medical care for shortness of breath. CAUSES   Not enough oxygen in the air such as with high altitudes or a smoke-filled room.  Certain lung diseases, infections, or problems.  Heart disease or conditions, such as angina or heart failure.  Low red blood cells (anemia).  Poor physical fitness, which can cause shortness of breath when you exercise.  Chest or back injuries or stiffness.  Being overweight.  Smoking.  Anxiety, which can make you feel like you are not getting enough air. DIAGNOSIS  Serious medical problems can often be found during your physical exam. Tests may also be done to determine why you are having shortness of breath. Tests may include:  Chest X-rays.  Lung function tests.  Blood tests.  An electrocardiogram (ECG).  An ambulatory electrocardiogram. An ambulatory ECG records your heartbeat patterns over a 24-hour period.  Exercise testing.  A transthoracic echocardiogram (TTE). During echocardiography, sound waves are used to evaluate how blood flows through your heart.  A transesophageal echocardiogram (TEE).  Imaging scans. Your health care provider may not be able to find a cause for your shortness of breath after your exam. In this case, it is important to have a follow-up exam with your health care provider as directed.  TREATMENT  Treatment for shortness of breath depends on the cause of your symptoms and can vary greatly. HOME CARE INSTRUCTIONS   Do not smoke. Smoking is a common cause of shortness of breath. If you smoke, ask for help  to quit.  Avoid being around chemicals or things that may bother your breathing, such as paint fumes and dust.  Rest as needed. Slowly resume your usual activities.  If medicines were prescribed, take them as directed for the full length of time directed. This includes oxygen and any inhaled medicines.  Keep all follow-up appointments as directed by your health care provider. SEEK MEDICAL CARE IF:   Your condition does not improve in the time expected.  You have a hard time doing your normal activities even with rest.  You have any new symptoms. SEEK IMMEDIATE MEDICAL CARE IF:   Your shortness of breath gets worse.  You feel light-headed, faint, or develop a cough not controlled with medicines.  You start coughing up blood.  You have pain with breathing.  You have chest pain or pain in your arms, shoulders, or abdomen.  You have a fever.  You are unable to walk up stairs or exercise the way you normally do. MAKE SURE YOU:  Understand these instructions.  Will watch your condition.  Will get help right away if you are not doing well or get worse.   This information is not intended to replace advice given to you by your health care provider. Make sure you discuss any questions you have with your health care provider.   Document Released: 03/10/2001 Document Revised: 06/20/2013 Document Reviewed: 08/31/2011 Elsevier Interactive Patient Education [employer].

## 2015-08-21 ENCOUNTER — Telehealth: Payer: Self-pay | Admitting: Cardiology

## 2015-08-21 NOTE — Telephone Encounter (Signed)
New message      Daughter talked to someone today regarding order for [facility] for an overnight oxycemetry.  [facility] does not have the order.  Please refax it so that they can get it to the patient today

## 2015-08-21 NOTE — Telephone Encounter (Signed)
Informed DPR that [facility] was called today and instructed to call patient to schedule ONO. She st they called and ONO is scheduled for Friday. She was grateful for call.

## 2015-08-24 DIAGNOSIS — G4733 Obstructive sleep apnea (adult) (pediatric): Secondary | ICD-10-CM | POA: Diagnosis not present

## 2015-08-24 DIAGNOSIS — M179 Osteoarthritis of knee, unspecified: Secondary | ICD-10-CM | POA: Diagnosis not present

## 2015-08-24 DIAGNOSIS — I259 Chronic ischemic heart disease, unspecified: Secondary | ICD-10-CM | POA: Diagnosis not present

## 2015-08-24 DIAGNOSIS — R0902 Hypoxemia: Secondary | ICD-10-CM | POA: Diagnosis not present

## 2015-08-25 ENCOUNTER — Telehealth: Payer: Self-pay | Admitting: Student

## 2015-08-25 ENCOUNTER — Other Ambulatory Visit: Payer: Self-pay | Admitting: Student

## 2015-08-25 NOTE — Telephone Encounter (Signed)
I received a call from the patient's daughter concerning his Insomnia. She reports this has been getting worse for the past month.   He has been sleeping in 10-30 intervals during the night and taking similar length naps during the day. She reports he is very anxious and jittery. Unable to use CPAP as he is not staying asleep long enough  He was seen in the ED earlier this week for his symptoms and they accredited it to his anxiety.  We reviewed good sleep hygiene and limiting caffeine and alcohol. The patient has been "taking a shot of Bourbon" to help with sleep. This was strongly advised against.   Has has tried Tylenol PM with no relief. I recommended trying Melatonin and seeing his PCP as soon as possible for possible Rx sleeping aids and anxiety medications if that is deemed appropriate.   The daughter wanted to make Dr. Radford Pax aware since she has been helping with CPAP adjustment in regards to his OSA. I will send her a staff message to make her aware.   Signed, Erma Heritage, PA-C 08/25/2015, 4:24 PM Pager: [phone]

## 2015-08-26 DIAGNOSIS — F411 Generalized anxiety disorder: Secondary | ICD-10-CM | POA: Diagnosis not present

## 2015-08-26 DIAGNOSIS — G4733 Obstructive sleep apnea (adult) (pediatric): Secondary | ICD-10-CM | POA: Diagnosis not present

## 2015-08-26 NOTE — Telephone Encounter (Signed)
Patients daughter called me first thing this morning just asking that we see what can be done.   She said basically the same thing as Brittany's not below - however she said she is just reaching out for help because he is now calling her at night to come over to spend the night.  She said that he is not sleeping during the day in hopes that he can sleep at night.  Once he lays down at night he will put on his BiPap machine and dozes off.... After about 10 minutes he is awake for the rest of the night.   She said this is so out of character for him, and she is beginning to worry.   She states that he says he is so exhausted that he cannot sleep.  Pt. Has tried melatonin, tylenol PM and she said that he literally lays there wide awake. She said the last few nights that she has been over there to spend the night he just paces the floor all night. She said that he is normally not an anxious person, so this is very new.   She is questioning what can be done to treat this.  She knows he needs to be on the Bipap machine, but she questions how he is supposed to wear this when he is so anxious that he is not able to fall asleep.   She asked that I please route this to Dr. Radford Pax to see if she has any suggestions.   He has not yet seen [facility] as he is a new patient that is just being set up, but I told her I would forward this to see if she has any suggestions.

## 2015-08-26 NOTE — Telephone Encounter (Signed)
Spoke with Dr. Edythe Lynn office and they are going to try to work him in today.  They will call daughter to schedule this as soon as they get an answer from Dr. Quintin Alto on when he can come in.  Daughter has been informed of this, and I asked that she call me back and let me know if they got in so that Dr. Radford Pax can be made aware of what is going on.   She stated that she would and that she appreciated the assistance.

## 2015-08-26 NOTE — Telephone Encounter (Signed)
Please get in with his PCP today to evaluate for anxiety

## 2015-08-29 ENCOUNTER — Encounter: Payer: Self-pay | Admitting: Cardiology

## 2015-08-29 ENCOUNTER — Encounter: Payer: Self-pay | Admitting: Internal Medicine

## 2015-08-29 ENCOUNTER — Ambulatory Visit (INDEPENDENT_AMBULATORY_CARE_PROVIDER_SITE_OTHER): Payer: Medicare Other | Admitting: Internal Medicine

## 2015-08-29 VITALS — BP 130/74 | HR 78 | Ht 74.0 in | Wt 262.4 lb

## 2015-08-29 DIAGNOSIS — I255 Ischemic cardiomyopathy: Secondary | ICD-10-CM

## 2015-08-29 DIAGNOSIS — Z951 Presence of aortocoronary bypass graft: Secondary | ICD-10-CM

## 2015-08-29 DIAGNOSIS — R0602 Shortness of breath: Secondary | ICD-10-CM

## 2015-08-29 DIAGNOSIS — R0600 Dyspnea, unspecified: Secondary | ICD-10-CM | POA: Diagnosis not present

## 2015-08-29 MED ORDER — FUROSEMIDE 40 MG PO TABS
40.0000 mg | ORAL_TABLET | Freq: Every day | ORAL | Status: DC
Start: 2015-08-29 — End: 2015-09-06

## 2015-08-29 NOTE — Progress Notes (Signed)
OFFICE NOTE  Chief Complaint:  Fatigue, low energy, DOE, leg swelling  Primary Care Physician: [name] [name], MD  HPI:  [name] [name] is a pleasant 70 year old gentleman previously followed by Dr. Rollene Fare. His past medical history is significant for coronary artery disease. In 2013 he underwent multivessel CABG for an ischemic cardiomyopathy. EF was 20-25% prior to surgery however post surgery his EF had improved up to 45-50%. Recently his EF was 40-45% by echo in May of 2014. There was mild mitral regurgitation, mild to moderately dilated left atrium and mild LVH. [name] [name] has been describing some anxiety. He also reports some pain in his legs however underwent Dopplers in September 2014 which showed preserved ABIs bilaterally a 1.0 on the right and 1.1 on the left. The bilateral peroneal and posterior tibial arteries were occluded. He reports some optimal control of his diabetes. His A1c was 7.2. He is concerned about the cost of taking Tradjenta. He is followed by Dr. Elyse Hsu.   He was recently seen in the emergency room and he can hospital. There he presented with hypotension and was given 1 L normal saline and his symptoms improved. He had been having dizziness as well as some upper back pain into the left shoulder blade with exertion recently. The symptoms are worse particularly when climbing up ladders or going up stairs and are relieved at rest. After that hospitalization it was recommended that he discontinue his ACE inhibitor and beta blocker, and his blood pressure appears improved today up to 110/60.  [name] [name] returns today for followup of his stress test. This is interpreted as nonischemic with an EF of 44%. He reports still feeling very fatigued and extremely short of breath when doing activities. He says that the other day he tried to hit golf balls and felt that he was totally exhausted after just an hour. He reports his blood pressure has improved somewhat off of  all medications however remains low. He has had symptoms of orthostatic hypotension in the past. His symptoms do feel similar to prior to his bypass surgery. He also feels that he was much better after surgery than he is now and that his symptoms have been going on for the past 2 months.  At his last office visit, I recommended that [name] [name] have a repeat cardiac catheterization. This demonstrated the following:  Impression:  1. 2 vessel native CAD with occluded LAD in the mid-vessel  2. Patent LIMA-LAD, SVG to PLA and SVG to PDA grafts with TIMI III flow  3. LVEDP = 13 mmHg  4. Random PVC's were noted  Based on these findings, I could not find any new cardiac cause of his symptoms. I suspect that some of his fatigue could be related to labile blood sugars. He says unexplained hypotension but is normotensive off of medications.  [name] [name] returns today for followup. He now reports feeling better since he had change in his medications. He is established with Dr. Buddy Duty who adjusted his diabetes medicines and stopped his Invokana. His shortness of breath and fatigue in both improved. He is now been expected some problems with left heel pain. He was found to have some spinal stenosis but has had improvement with inserts in his shoes.  I saw [name] [name] back in the office today. He has successfully underwent back surgery earlier this year which she says initially was much improved, however he says he subsequently developed more pain going down his legs. This  sounds like it's neuropathic pain, but is reluctant to try medication for it. He also significantly fatigue. This could be due to a number of etiologies. 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 Also, he is noted to have a history of obstructive sleep apnea. He was placed on BiPAP, but has not been compliant with that due to difficulty wearing the  mask. He also never had follow-up of his sleep study.  [name] [name] returns today for follow-up. He reports he's had worsening dyspnea and fatigue. He says that he can hardly sleep at night. He is struggling with significant anxiety. 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 He's not been wearing the BiPAP due to significant anxiety and difficulty sleeping. He feels like he gets smothered with his machine. Communication between Dr. Radford Pax and Dr. Quintin Alto led to the starting of Celexa as well as Klonopin to use as needed at night. He says that it does give him about 6 hours of sleep. He has not been using his BiPAP as instructed. He is reportedly had more worsening shortness of breath. He was seen in the ER for this and it was thought this was due [email or web address] with BiPAP. His BNP however is elevated over 300. Today in the office he says that he's had more swelling and more abdominal fullness as well as leg edema.  PMHx:  Past Medical History  Diagnosis Date  . Diabetes mellitus   . Coronary artery disease   . High cholesterol   . Ischemic cardiomyopathy   . CAD (coronary artery disease) 07/06/2011  . LV dysfunction 07/06/2011  . Stroke [facility])     [place]   . CHF (congestive heart failure) ([place])   . Arthritis     Knee L - probably  . 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 Ellison Hughs  . BPH (benign prostatic hyperplasia)   . History of nuclear stress test 09/2010    dipyridamole; mild perfusion defect due to attenuation with mild superimposed ischemia in apical septal, apical, apical inferior & apical lateral regions; rest LV enlarged in size; prominent gut uptake in infero-apical region; no significant ischemia demonstrated; low risk scan   . Right bundle branch block   . Spinal stenosis of lumbar region   . HNP (herniated nucleus pulposus), lumbar     . Nocturia   . Frequency of urination   . Sleep apnea     sleep study 10/2010- AHI during total sleep 32.1/hr and during REM 62.3/hr (severe sleep apnea)unable to tolerate c pap    Past Surgical History  Procedure Laterality Date  . Cardiac catheterization  01/2011    ischemic cardiomyopathy 30-35%, multivessel CAD (Dr. [name])   . Pilonidal cyst removed    . Colonscopy    . Transthoracic echocardiogram  11/10/2012    EF 40-45%, mild LVH, mild hypokinesis of anteroseptal myocardium, grade 1 diastolic dysfunction; mild MR & calcifed mitral annulus; LA mild-mod dilated; RA mildly dilated  . Lower extremity arterial doppler  03/16/2013    bilat ABIs demonstated normal values; R runoff - posterior tibial & anterior tibial arteries occluded; L runoff - peroneal & posterior tibial arteries occluded, anterior tibial artery appears occluded  . Left heart catheterization with coronary/graft angiogram  N/A 09/20/2013    Procedure: LEFT HEART CATHETERIZATION WITH [name];  Surgeon: [name], MD;  Location: [facility] CATH LAB;  Service: Cardiovascular;  Laterality: N/A;  . Coronary artery bypass graft  08/03/2011    Procedure: CORONARY ARTERY BYPASS GRAFTING (CABG);  Surgeon: [name], MD;  Location: [facility];  Service: Open Heart Surgery;  Laterality: N/A;  CABG times three using right saphenous vein and left mammary artery usisng endoscope.  . Lumbar laminectomy/decompression microdiscectomy N/A 09/12/2014    Procedure: LUMBAR DECOMPRESSION L3-L4, L4-L5, MICRODISCECTOMY L4-L5;  Surgeon: [name], MD;  Location: [facility];  Service: Orthopedics;  Laterality: N/A;    FAMHx:  Family History  Problem Relation Age of Onset  . Anesthesia problems Daughter   . Cancer Mother   . Lung disease Father     & heart disease  . Colon cancer Sister   . Sudden death Maternal Grandmother     SOCHx:   reports that he quit smoking about 3 years ago. His smoking use included Cigars. He quit  smokeless tobacco use about 3 years ago. He reports that he drinks alcohol. He reports that he does not use illicit drugs.  ALLERGIES:  No Known Allergies  ROS: A comprehensive review of systems was negative except for: Constitutional: positive for fatigue Respiratory: positive for dyspnea on exertion Musculoskeletal: positive for back pain Neurological: positive for weakness  HOME MEDS: Current Outpatient Prescriptions  Medication Sig Dispense Refill  . aspirin EC 81 MG tablet Take 81 mg by mouth daily.    . citalopram (CELEXA) 20 MG tablet Take 1 tablet by mouth daily.    . clonazePAM (KLONOPIN) 0.5 MG tablet Take 1 tablet by mouth daily.    Marland Kitchen glipiZIDE (GLUCOTROL XL) 10 MG 24 hr tablet Take 10 mg by mouth every morning.     . metFORMIN (GLUCOPHAGE-XR) 500 MG 24 hr tablet Take 1,000 mg by mouth 2 (two) times daily.    . polycarbophil (FIBERCON) 625 MG tablet Take 625 mg by mouth as needed for mild constipation or moderate constipation.     . rosuvastatin (CRESTOR) 20 MG tablet Take 10 mg by mouth every evening.     Marland Kitchen spironolactone (ALDACTONE) 25 MG tablet Take 0.5 tablets (12.5 mg total) by mouth daily. 15 tablet 9  . tamsulosin (FLOMAX) 0.4 MG CAPS capsule Take 1 capsule by mouth every evening.     . TRADJENTA 5 MG TABS tablet Take 5 mg by mouth daily.    . furosemide (LASIX) 40 MG tablet Take 1 tablet (40 mg total) by mouth daily. 30 tablet 5   No current facility-administered medications for this visit.    LABS/IMAGING: No results found for this or any previous visit (from the past 48 hour(s)). No results found.  VITALS: BP 130/74 mmHg  Pulse 78  Ht 6\' 2"  (1.88 m)  Wt 262 lb 6.4 oz (119.024 kg)  BMI 33.68 kg/m2  EXAM: General appearance: alert and no distress Neck: JVD - 3 cm above sternal notch and no carotid bruit Lungs: diminished breath sounds bibasilar Heart: regular rate and rhythm, S1, S2 normal, no murmur, click, rub or gallop Abdomen: soft, non-tender;  bowel sounds normal; no masses,  no organomegaly Extremities: edema 1+ bilateral edema Pulses: 2+ bilateral DP, 2+ L PT, 1+ R PT Skin: Skin color, texture, turgor normal. No rashes or lesions Neurologic: Grossly normal Psych: Pleasant  EKG: Deferred  ASSESSMENT: 1. Acute on chronic systolic congestive heart failure - EF 40-45%  2. Coronary artery disease status post three-vessel CABG in 2013 (LIMA to LAD, SVG to PDA and SVG to PLA) 3. Hypertension-controlled 4. Dyslipidemia 5. Diabetes type 2 - better controlled 6. Mild PVD-with normal ABI 7. Hypotension/upper back pain - resolved 8. Spinal stenosis - s/p surgery 9. Trifascicular block 10. OSA-noncompliant with BiPAP  PLAN: 1.   [name] [name] has recently developed some worsening shortness of breath and lower extremity swelling. His BNP was elevated during his ER visit but he was not started on any diuretics. I think he would benefit from this and would recommend starting Lasix 40 mg daily today. We will arrange for a conference a metabolic profile and BNP next week. We will also schedule repeat echocardiogram to see if he has had in the interim decline in LV function. I've encouraged compliance with BiPAP and using sleep medicine is necessary to ensure good sleep at night. He should also refrain from trying to nap during the day.  [name], MD, [facility] Attending Cardiologist [place] C [name] 08/29/2015, 6:29 PM

## 2015-08-29 NOTE — Patient Instructions (Signed)
Medication Instructions:  START Lasix 40mg  daily. An Rx has been sent to your pharmacy  Labwork: Your physician recommends that you return for lab work in: 1 week (Cmet,Bnp)  Testing/Procedures: Your physician has requested that you have an echocardiogram. Echocardiography is a painless test that uses sound waves to create images of your heart. It provides your doctor with information about the size and shape of your heart and how well your heart's chambers and valves are working. This procedure takes approximately one hour. There are no restrictions for this procedure. (To be scheduled in 1 week)  Follow-Up: Your physician recommends that you schedule a follow-up appointment in: 2 weeks with Dr.[name] 3/28 or 09/25/15   Any Other Special Instructions Will Be Listed Below (If Applicable).     If you need a refill on your cardiac medications before your next appointment, please call your pharmacy.

## 2015-08-30 ENCOUNTER — Telehealth: Payer: Self-pay | Admitting: *Deleted

## 2015-08-30 DIAGNOSIS — R0902 Hypoxemia: Secondary | ICD-10-CM

## 2015-08-30 DIAGNOSIS — G4733 Obstructive sleep apnea (adult) (pediatric): Secondary | ICD-10-CM

## 2015-08-30 NOTE — Telephone Encounter (Signed)
-----   Message from Sueanne Margarita, MD sent at 08/29/2015  2:58 PM EST ----- Patient has significant nocturnal hypoxemia - please start on O2 at 2L via BiPAP at night and repeat pulse oximetry on oxygen and BiPAP

## 2015-08-30 NOTE — Telephone Encounter (Signed)
Patient's daughter is aware.   Orders have been placed and message sent to [facility].

## 2015-09-05 ENCOUNTER — Other Ambulatory Visit: Payer: Self-pay

## 2015-09-05 ENCOUNTER — Other Ambulatory Visit (INDEPENDENT_AMBULATORY_CARE_PROVIDER_SITE_OTHER): Payer: Medicare Other | Admitting: *Deleted

## 2015-09-05 ENCOUNTER — Ambulatory Visit (HOSPITAL_COMMUNITY): Payer: Medicare Other | Attending: Cardiovascular Disease

## 2015-09-05 DIAGNOSIS — I071 Rheumatic tricuspid insufficiency: Secondary | ICD-10-CM | POA: Insufficient documentation

## 2015-09-05 DIAGNOSIS — I340 Nonrheumatic mitral (valve) insufficiency: Secondary | ICD-10-CM | POA: Insufficient documentation

## 2015-09-05 DIAGNOSIS — G4733 Obstructive sleep apnea (adult) (pediatric): Secondary | ICD-10-CM | POA: Insufficient documentation

## 2015-09-05 DIAGNOSIS — Z951 Presence of aortocoronary bypass graft: Secondary | ICD-10-CM | POA: Insufficient documentation

## 2015-09-05 DIAGNOSIS — R0602 Shortness of breath: Secondary | ICD-10-CM

## 2015-09-05 DIAGNOSIS — I959 Hypotension, unspecified: Secondary | ICD-10-CM | POA: Insufficient documentation

## 2015-09-05 DIAGNOSIS — R0600 Dyspnea, unspecified: Secondary | ICD-10-CM | POA: Diagnosis present

## 2015-09-05 DIAGNOSIS — I119 Hypertensive heart disease without heart failure: Secondary | ICD-10-CM | POA: Diagnosis not present

## 2015-09-05 DIAGNOSIS — E119 Type 2 diabetes mellitus without complications: Secondary | ICD-10-CM | POA: Insufficient documentation

## 2015-09-05 LAB — COMPREHENSIVE METABOLIC PANEL
ALBUMIN: 3.8 g/dL (ref 3.6–5.1)
ALT: 20 U/L (ref 9–46)
AST: 27 U/L (ref 10–35)
Alkaline Phosphatase: 99 U/L (ref 40–115)
BUN: 17 mg/dL (ref 7–25)
CHLORIDE: 103 mmol/L (ref 98–110)
CO2: 24 mmol/L (ref 20–31)
Calcium: 8.6 mg/dL (ref 8.6–10.3)
Creat: 0.83 mg/dL (ref 0.70–1.18)
Glucose, Bld: 163 mg/dL — ABNORMAL HIGH (ref 65–99)
POTASSIUM: 4.9 mmol/L (ref 3.5–5.3)
Sodium: 138 mmol/L (ref 135–146)
TOTAL PROTEIN: 7.2 g/dL (ref 6.1–8.1)
Total Bilirubin: 0.9 mg/dL (ref 0.2–1.2)

## 2015-09-05 LAB — ECHOCARDIOGRAM COMPLETE
FS: 20 % — AB (ref 28–44)
IVS/LV PW RATIO, ED: 0.86
LA diam end sys: 51 cm
LV PW d: 10.1 mm — AB (ref 0.6–1.1)
LVOT area: 3.8 cm2
LVOT peak vel: 73.5 m/s
Simpson's disk: 34
Stroke v: 56 mL
VTI: 14.7 cm
VTI: 148 cm

## 2015-09-05 LAB — BRAIN NATRIURETIC PEPTIDE: BRAIN NATRIURETIC PEPTIDE: 338.4 pg/mL — AB (ref <100)

## 2015-09-05 MED ORDER — PERFLUTREN LIPID MICROSPHERE
1.0000 mL | INTRAVENOUS | Status: AC | PRN
  Administered 2015-09-05: 1 mL via INTRAVENOUS

## 2015-09-05 NOTE — Addendum Note (Signed)
Addended by: Eulis Foster on: 09/05/2015 07:52 AM   Modules accepted: Orders

## 2015-09-05 NOTE — Telephone Encounter (Signed)
Received the following message from [place] reviewed this pt's BIPAP titration and he did not see documentation of sats of 88% or below for a total of 5 mins once therapeutic pressure was reached so [employer] will not pay for O2 for this patient.   We will reach out to him with a private pay option and move forward with the ONO on BIPAP.    Please let me know if we can do anything else.   Thanks     I have left a message for patient's daughter to discuss.

## 2015-09-06 ENCOUNTER — Telehealth: Payer: Self-pay | Admitting: Internal Medicine

## 2015-09-06 DIAGNOSIS — Z79899 Other long term (current) drug therapy: Secondary | ICD-10-CM

## 2015-09-06 DIAGNOSIS — R0602 Shortness of breath: Secondary | ICD-10-CM

## 2015-09-06 NOTE — Telephone Encounter (Signed)
Total sleep time (Non REM) on CPAP spent with O2 sats < 88% was 10.9 minutes - this is in sleep lab documents during titration. I have amended by sleep note to state that.

## 2015-09-06 NOTE — Telephone Encounter (Signed)
Patient called and notified of echo & lab results reviewed by Dr. Loletha Grayer - see previous tele note from 09/06/15. Patient voiced understanding of med changes and need for repeat labs in 1-2 weeks. Med list updated. Labs ordered.

## 2015-09-06 NOTE — Telephone Encounter (Signed)
Daughter states as well as [place] that Medicare will not pay for oxygen because a home ONO does not qualify a patient for oxygen.  [facility] looked back through the BiPAP titration record, and stated that it does not state that patient had desaturations less than 88% for minutes or more.   Routed to Dr. Radford Pax to advise

## 2015-09-06 NOTE — Telephone Encounter (Signed)
LM for patient to return call regarding tests - echo & labs Reviewed with Dr. Loletha Grayer - newly reduced EF on echo (30-35%) - was c/o worsening SOB @ last MD OV, labs showed elevated BNP  Per Dr. Loletha Grayer - increase spironolactone from 12.5mg  daily to 25mg  and increase lasix to 40mg  twice daily (from QD) - repeat BMET, BNP in 1-2 weeks  Patient has follow up with Dr. Debara Pickett on 3/29 Will ask Dr. Debara Pickett next week if he thinks he needs to be seen sooner

## 2015-09-06 NOTE — Telephone Encounter (Signed)
New message ° ° ° ° °Returning a call to the nurse °

## 2015-09-09 NOTE — Telephone Encounter (Signed)
[facility] is aware of this and will try to process the claim again.   They will let me know if there are any further issues.

## 2015-09-10 ENCOUNTER — Telehealth: Payer: Self-pay | Admitting: Internal Medicine

## 2015-09-10 NOTE — Telephone Encounter (Signed)
New message ° ° ° ° °Returning a call to the nurse °

## 2015-09-10 NOTE — Telephone Encounter (Signed)
Returned call to patient - he can come in for an MD OV on Thursday 3/16 @ 0900 (double book) - he could not make same-day slot on 3/15 with Dr. Debara Pickett

## 2015-09-12 ENCOUNTER — Ambulatory Visit (INDEPENDENT_AMBULATORY_CARE_PROVIDER_SITE_OTHER): Payer: Medicare Other | Admitting: Internal Medicine

## 2015-09-12 ENCOUNTER — Encounter: Payer: Self-pay | Admitting: Internal Medicine

## 2015-09-12 ENCOUNTER — Telehealth: Payer: Self-pay | Admitting: Internal Medicine

## 2015-09-12 VITALS — BP 130/70 | HR 100 | Ht 74.0 in | Wt 245.2 lb

## 2015-09-12 DIAGNOSIS — I519 Heart disease, unspecified: Secondary | ICD-10-CM | POA: Diagnosis not present

## 2015-09-12 DIAGNOSIS — I255 Ischemic cardiomyopathy: Secondary | ICD-10-CM | POA: Diagnosis not present

## 2015-09-12 DIAGNOSIS — Z01812 Encounter for preprocedural laboratory examination: Secondary | ICD-10-CM | POA: Diagnosis not present

## 2015-09-12 DIAGNOSIS — R5383 Other fatigue: Secondary | ICD-10-CM

## 2015-09-12 DIAGNOSIS — I5021 Acute systolic (congestive) heart failure: Secondary | ICD-10-CM | POA: Insufficient documentation

## 2015-09-12 DIAGNOSIS — R0602 Shortness of breath: Secondary | ICD-10-CM

## 2015-09-12 DIAGNOSIS — Z951 Presence of aortocoronary bypass graft: Secondary | ICD-10-CM | POA: Diagnosis not present

## 2015-09-12 DIAGNOSIS — D689 Coagulation defect, unspecified: Secondary | ICD-10-CM

## 2015-09-12 DIAGNOSIS — G4733 Obstructive sleep apnea (adult) (pediatric): Secondary | ICD-10-CM

## 2015-09-12 DIAGNOSIS — R0600 Dyspnea, unspecified: Secondary | ICD-10-CM

## 2015-09-12 LAB — CBC
HCT: 41.2 % (ref 39.0–52.0)
Hemoglobin: 13.7 g/dL (ref 13.0–17.0)
MCH: 28.6 pg (ref 26.0–34.0)
MCHC: 33.3 g/dL (ref 30.0–36.0)
MCV: 86 fL (ref 78.0–100.0)
MPV: 9.7 fL (ref 8.6–12.4)
PLATELETS: 243 10*3/uL (ref 150–400)
RBC: 4.79 MIL/uL (ref 4.22–5.81)
RDW: 13.8 % (ref 11.5–15.5)
WBC: 6.5 10*3/uL (ref 4.0–10.5)

## 2015-09-12 LAB — PROTIME-INR
INR: 1.14 (ref <1.50)
Prothrombin Time: 14.7 seconds (ref 11.6–15.2)

## 2015-09-12 LAB — BRAIN NATRIURETIC PEPTIDE: BRAIN NATRIURETIC PEPTIDE: 289.4 pg/mL — AB (ref <100)

## 2015-09-12 LAB — TSH: TSH: 1.66 mIU/L (ref 0.40–4.50)

## 2015-09-12 LAB — APTT: APTT: 33 s (ref 24–37)

## 2015-09-12 LAB — BASIC METABOLIC PANEL
BUN: 19 mg/dL (ref 7–25)
CALCIUM: 9.4 mg/dL (ref 8.6–10.3)
CO2: 30 mmol/L (ref 20–31)
Chloride: 103 mmol/L (ref 98–110)
Creat: 1.03 mg/dL (ref 0.70–1.18)
GLUCOSE: 206 mg/dL — AB (ref 65–99)
Potassium: 4.8 mmol/L (ref 3.5–5.3)
SODIUM: 142 mmol/L (ref 135–146)

## 2015-09-12 MED ORDER — CARVEDILOL 6.25 MG PO TABS: 6.2500 mg | ORAL_TABLET | Freq: Two times a day (BID) | ORAL | Status: DC

## 2015-09-12 NOTE — Patient Instructions (Addendum)
Your physician has requested that you have a right & left cardiac catheterization @ [facility] with Dr. Debara Pickett on Friday March 17th. Cardiac catheterization is used to diagnose and/or treat various heart conditions. Doctors may recommend this procedure for a number of different reasons. The most common reason is to evaluate chest pain. Chest pain can be a symptom of coronary artery disease (CAD), and cardiac catheterization can show whether plaque is narrowing or blocking your heart's arteries. This procedure is also used to evaluate the valves, as well as measure the blood flow and oxygen levels in different parts of your heart. For further information please visit [email or web address]. Please follow instruction sheet, as given.  Following your catheterization, you will not be allowed to drive for 3 days.  No lifting, pushing, or pulling greater that 10 pounds is allowed for 1 week.  You will be required to have the following tests prior to the procedure:  1. Blood work - the blood work can be done no more than 7 days prior to the procedure.  It can be done at any [facility] lab. There is a lab downstairs on the first floor of this building in [place] has recommended you make the following change in your medication: START carvedilol 6.25mg  twice daily  **PLEASE HOLD YOUR DIABETIC MEDICATIONS TONIGHT AND TOMORROW MORNING.  Dr. Debara Pickett ordered portable oxygen concentrator

## 2015-09-12 NOTE — Progress Notes (Signed)
OFFICE NOTE  Chief Complaint:  Follow-up echo results  Primary Care Physician: [name] [name], MD  HPI:  [name] [name] is a pleasant 71 year old gentleman previously followed by Dr. Rollene Fare. His past medical history is significant for coronary artery disease. In 2013 he underwent multivessel CABG for an ischemic cardiomyopathy. EF was 20-25% prior to surgery however post surgery his EF had improved up to 45-50%. Recently his EF was 40-45% by echo in May of 2014. There was mild mitral regurgitation, mild to moderately dilated left atrium and mild LVH. [name] [name] has been describing some anxiety. He also reports some pain in his legs however underwent Dopplers in September 2014 which showed preserved ABIs bilaterally a 1.0 on the right and 1.1 on the left. The bilateral peroneal and posterior tibial arteries were occluded. He reports some optimal control of his diabetes. His A1c was 7.2. He is concerned about the cost of taking Tradjenta. He is followed by Dr. Elyse Hsu.   He was recently seen in the emergency room and he can hospital. There he presented with hypotension and was given 1 L normal saline and his symptoms improved. He had been having dizziness as well as some upper back pain into the left shoulder blade with exertion recently. The symptoms are worse particularly when climbing up ladders or going up stairs and are relieved at rest. After that hospitalization it was recommended that he discontinue his ACE inhibitor and beta blocker, and his blood pressure appears improved today up to 110/60.  [name] [name] returns today for followup of his stress test. This is interpreted as nonischemic with an EF of 44%. He reports still feeling very fatigued and extremely short of breath when doing activities. He says that the other day he tried to hit golf balls and felt that he was totally exhausted after just an hour. He reports his blood pressure has improved somewhat off of all medications  however remains low. He has had symptoms of orthostatic hypotension in the past. His symptoms do feel similar to prior to his bypass surgery. He also feels that he was much better after surgery than he is now and that his symptoms have been going on for the past 2 months.  At his last office visit, I recommended that [name] [name] have a repeat cardiac catheterization (08/2013). This demonstrated the following:  Impression:  1. 2 vessel native CAD with occluded LAD in the mid-vessel  2. Patent LIMA-LAD, SVG to PLA and SVG to PDA grafts with TIMI III flow  3. LVEDP = 13 mmHg  4. Random PVC's were noted  Based on these findings, I could not find any new cardiac cause of his symptoms. I suspect that some of his fatigue could be related to labile blood sugars. He says unexplained hypotension but is normotensive off of medications.  [name] [name] returns today for followup. He now reports feeling better since he had change in his medications. He is established with Dr. Buddy Duty who adjusted his diabetes medicines and stopped his Invokana. His shortness of breath and fatigue in both improved. He is now been expected some problems with left heel pain. He was found to have some spinal stenosis but has had improvement with inserts in his shoes.  I saw [name] [name] back in the office today. He has successfully underwent back surgery earlier this year which she says initially was much improved, however he says he subsequently developed more pain going down his legs. This sounds like  it's neuropathic pain, but is reluctant to try medication for it. He also significantly fatigue. This could be due to a number of etiologies. 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 Also, he is noted to have a history of obstructive sleep apnea. He was placed on BiPAP, but has not been compliant with that due to difficulty wearing the mask.  He also never had follow-up of his sleep study.  [name] [name] returns today for follow-up. He reports he's had worsening dyspnea and fatigue. He says that he can hardly sleep at night. He is struggling with significant anxiety. 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 He's not been wearing the BiPAP due to significant anxiety and difficulty sleeping. He feels like he gets smothered with his machine. Communication between Dr. Radford Pax and Dr. Quintin Alto led to the starting of Celexa as well as Klonopin to use as needed at night. He says that it does give him about 6 hours of sleep. He has not been using his BiPAP as instructed. He is reportedly had more worsening shortness of breath. He was seen in the ER for this and it was thought this was due [email or web address] with BiPAP. His BNP however is elevated over 300. Today in the office he says that he's had more swelling and more abdominal fullness as well as leg edema.  I had the pleasure seeing [name] [name] back today in the office. He seems to respond nicely to Lasix. I started him on 40 mg daily and while I was out of town my partner reviewed his lab work which included an elevated BNP over 300. He increased his Lasix to 40 mg twice a day. [name] [name] says he's had a significant improvement in his breathing. The BNP now is in the mid 200s. Renal function is stable based on labs today. Unfortunately, his echocardiogram does show a new cardiomyopathy with EF 30-35%. It should be noted that he felt "awful" on blood pressure/heart failure medicines and had taken himself off of ACE inhibitor and beta blocker in the past. The echo however does suggest inferior and anterolateral wall motion abnormalities which are new and could indicate graft dysfunction. I performed his last heart catheterization in 2015 which showed all 3 grafts patent.  PMHx:  Past Medical History  Diagnosis Date  . Diabetes mellitus   .  Coronary artery disease   . High cholesterol   . Ischemic cardiomyopathy   . CAD (coronary artery disease) 07/06/2011  . LV dysfunction 07/06/2011  . Stroke [facility])     [place]   . CHF (congestive heart failure) ([place])   . Arthritis     Knee L - probably  . 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 Ellison Hughs  . BPH (benign prostatic hyperplasia)   . History of nuclear stress test 09/2010    dipyridamole; mild perfusion defect due to attenuation with mild superimposed ischemia in apical septal, apical, apical inferior & apical lateral regions; rest LV enlarged in size; prominent gut uptake in infero-apical region; no significant ischemia demonstrated; low risk scan   . Right bundle branch block   . Spinal stenosis of lumbar region   . HNP (herniated nucleus pulposus), lumbar   . Nocturia   . Frequency  of urination   . Sleep apnea     sleep study 10/2010- AHI during total sleep 32.1/hr and during REM 62.3/hr (severe sleep apnea)unable to tolerate c pap    Past Surgical History  Procedure Laterality Date  . Cardiac catheterization  01/2011    ischemic cardiomyopathy 30-35%, multivessel CAD (Dr. [name])   . Pilonidal cyst removed    . Colonscopy    . Transthoracic echocardiogram  11/10/2012    EF 40-45%, mild LVH, mild hypokinesis of anteroseptal myocardium, grade 1 diastolic dysfunction; mild MR & calcifed mitral annulus; LA mild-mod dilated; RA mildly dilated  . Lower extremity arterial doppler  03/16/2013    bilat ABIs demonstated normal values; R runoff - posterior tibial & anterior tibial arteries occluded; L runoff - peroneal & posterior tibial arteries occluded, anterior tibial artery appears occluded  . Left heart catheterization with coronary/graft angiogram N/A 09/20/2013    Procedure: LEFT HEART CATHETERIZATION WITH [name];  Surgeon: [name], MD;  Location: [facility] CATH LAB;   Service: Cardiovascular;  Laterality: N/A;  . Coronary artery bypass graft  08/03/2011    Procedure: CORONARY ARTERY BYPASS GRAFTING (CABG);  Surgeon: [name], MD;  Location: [facility];  Service: Open Heart Surgery;  Laterality: N/A;  CABG times three using right saphenous vein and left mammary artery usisng endoscope.  . Lumbar laminectomy/decompression microdiscectomy N/A 09/12/2014    Procedure: LUMBAR DECOMPRESSION L3-L4, L4-L5, MICRODISCECTOMY L4-L5;  Surgeon: [name], MD;  Location: [facility] ORS;  Service: Orthopedics;  Laterality: N/A;    FAMHx:  Family History  Problem Relation Age of Onset  . Anesthesia problems Daughter   . Cancer Mother   . Lung disease Father     & heart disease  . Colon cancer Sister   . Sudden death Maternal Grandmother     SOCHx:   reports that he quit smoking about 4 years ago. His smoking use included Cigars. He quit smokeless tobacco use about 3 years ago. He reports that he drinks alcohol. He reports that he does not use illicit drugs.  ALLERGIES:  No Known Allergies  ROS: Pertinent items noted in HPI and remainder of comprehensive ROS otherwise negative.  HOME MEDS: Current Outpatient Prescriptions  Medication Sig Dispense Refill  . aspirin EC 81 MG tablet Take 81 mg by mouth daily.    . citalopram (CELEXA) 20 MG tablet Take 1 tablet by mouth daily.    . clonazePAM (KLONOPIN) 0.5 MG tablet Take 1 tablet by mouth daily.    . furosemide (LASIX) 40 MG tablet Take 40 mg by mouth 2 (two) times daily.    Marland Kitchen glipiZIDE (GLUCOTROL XL) 10 MG 24 hr tablet Take 10 mg by mouth every morning.     . metFORMIN (GLUCOPHAGE-XR) 500 MG 24 hr tablet Take 1,000 mg by mouth 2 (two) times daily.    . polycarbophil (FIBERCON) 625 MG tablet Take 625 mg by mouth as needed for mild constipation or moderate constipation.     . rosuvastatin (CRESTOR) 20 MG tablet Take 10 mg by mouth every evening.     Marland Kitchen spironolactone (ALDACTONE) 25 MG tablet Take 25 mg by mouth daily.      . tamsulosin (FLOMAX) 0.4 MG CAPS capsule Take 1 capsule by mouth every evening.     . TRADJENTA 5 MG TABS tablet Take 5 mg by mouth daily.    . carvedilol (COREG) 6.25 MG tablet Take 1 tablet (6.25 mg total) by mouth 2 (two) times daily. [place]  tablet 5   No current facility-administered medications for this visit.    LABS/IMAGING: Results for orders placed or performed in visit on 09/12/15 (from the past 48 hour(s))  Basic metabolic panel     Status: Abnormal   Collection Time: 09/12/15 10:04 AM  Result Value Ref Range   Sodium 142 135 - 146 mmol/L   Potassium 4.8 3.5 - 5.3 mmol/L   Chloride 103 98 - 110 mmol/L   CO2 30 20 - 31 mmol/L   Glucose, Bld 206 (H) 65 - 99 mg/dL   BUN 19 7 - 25 mg/dL   Creat 1.03 0.70 - 1.18 mg/dL   Calcium 9.4 8.6 - 10.3 mg/dL  B Nat Peptide     Status: Abnormal   Collection Time: 09/12/15 10:04 AM  Result Value Ref Range   Brain Natriuretic Peptide 289.4 (H) <100 pg/mL    Comment:   BNP levels increase with age in the general population with the highest values seen in individuals greater than 62 years of age. Reference: Joellyn Rued Cardiol 2002; [id number].     CBC     Status: None   Collection Time: 09/12/15 10:04 AM  Result Value Ref Range   WBC 6.5 4.0 - 10.5 K/uL   RBC 4.79 4.22 - 5.81 MIL/uL   Hemoglobin 13.7 13.0 - 17.0 g/dL   HCT 41.2 39.0 - 52.0 %   MCV 86.0 78.0 - 100.0 fL   MCH 28.6 26.0 - 34.0 pg   MCHC 33.3 30.0 - 36.0 g/dL   RDW 13.8 11.5 - 15.5 %   Platelets 243 150 - 400 K/uL   MPV 9.7 8.6 - 12.4 fL  APTT     Status: None   Collection Time: 09/12/15 10:04 AM  Result Value Ref Range   aPTT 33 24 - 37 seconds    Comment: This test is for screening purposes only; it should not be used for therapeutic unfractionated heparin monitoring.  Please refer to Heparin Anti-Xa [id number]).   Protime-INR     Status: None   Collection Time: 09/12/15 10:04 AM  Result Value Ref Range   Prothrombin Time 14.7 11.6 - 15.2 seconds   INR 1.14 <1.50     Comment: The INR is of principal utility in following patients on stable doses of oral anticoagulants.  The therapeutic range is generally 2.0 to 3.0, but may be 3.0 to 4.0 in patients with mechanical cardiac valves, recurrent embolisms and antiphospholipid antibodies (including lupus inhibitors).   TSH     Status: None   Collection Time: 09/12/15 10:04 AM  Result Value Ref Range   TSH 1.66 0.40 - 4.50 mIU/L   No results found.  VITALS: BP 130/70 mmHg  Pulse 100  Ht 6\' 2"  (1.88 m)  Wt 245 lb 3.2 oz (111.222 kg)  BMI 31.47 kg/m2  EXAM: General appearance: alert and no distress Neck: no carotid bruit and no JVD Lungs: clear to auscultation bilaterally Heart: regular rate and rhythm, S1, S2 normal, no murmur, click, rub or gallop Abdomen: soft, non-tender; bowel sounds normal; no masses,  no organomegaly Extremities: extremities normal, atraumatic, no cyanosis or edema Pulses: 2+ and symmetric 2+ bilateral DP, 2+ L PT, 1+ R PT Skin: Skin color, texture, turgor normal. No rashes or lesions Neurologic: Grossly normal Psych: Pleasant  EKG: Deferred  ASSESSMENT: 1. Acute on chronic systolic congestive heart failure - EF 30-35% 2. Coronary artery disease status post three-vessel CABG in 2013 (LIMA to LAD, SVG to PDA  and SVG to PLA) 3. Hypertension-controlled 4. Dyslipidemia 5. Diabetes type 2 - better controlled 6. Mild PVD-with normal ABI 7. Hypotension/upper back pain - resolved 8. Spinal stenosis - s/p surgery 9. Trifascicular block 10. OSA-back on BiPAP  PLAN: 1.   Mr. Ferraioli has responded to Lasix and again has some evidence of cardiomyopathy. In the past his EF has been as low as 40-45% but increased to normal after catheterization with medical therapy that demonstrated patent coronary bypass grafts. It is been 2 years since that study and he recently developed more acute onset shortness of breath and does have congestive heart failure. EF now is 30-35%. As there  are new wall motion abnormalities on his echo, I'm recommending right and left heart catheterization. He does seem close to euvolemic although BNP is still elevated in the mid 200s. For now he should continue his Lasix dose 40 mg twice a day. I've convinced him to try a beta blocker and we'll start carvedilol 6.25 twice a day. Based on the findings and his blood pressure we may be able to add an additional medications to treat heart failure. I did discuss risks and benefits of heart catheterization as well as alternatives with him and he is agreeable to proceed. We'll likely go through her right femoral approach for the left and right heart catheterization. Will obtain standard lab work today and he is scheduled for tomorrow.  [name], MD, [facility] Attending Cardiologist [place] C Brittania Sudbeck 09/12/2015, 6:23 PM

## 2015-09-12 NOTE — Telephone Encounter (Signed)
Received call from [facility] with [facility] lab calling to report stat lab.Lab to be faxed to [phone].

## 2015-09-13 ENCOUNTER — Ambulatory Visit (HOSPITAL_COMMUNITY)
Admission: RE | Admit: 2015-09-13 | Discharge: 2015-09-13 | Disposition: A | Discharge status: Home / Self Care | Payer: Medicare Other | Source: Ambulatory Visit | Attending: Internal Medicine | Admitting: Internal Medicine

## 2015-09-13 ENCOUNTER — Encounter (HOSPITAL_COMMUNITY)
Admission: RE | Disposition: A | Discharge status: Home / Self Care | Payer: Self-pay | Source: Ambulatory Visit | Attending: Internal Medicine

## 2015-09-13 DIAGNOSIS — I2582 Chronic total occlusion of coronary artery: Secondary | ICD-10-CM | POA: Diagnosis not present

## 2015-09-13 DIAGNOSIS — I2510 Atherosclerotic heart disease of native coronary artery without angina pectoris: Secondary | ICD-10-CM | POA: Insufficient documentation

## 2015-09-13 DIAGNOSIS — I2584 Coronary atherosclerosis due to calcified coronary lesion: Secondary | ICD-10-CM | POA: Insufficient documentation

## 2015-09-13 DIAGNOSIS — I255 Ischemic cardiomyopathy: Secondary | ICD-10-CM

## 2015-09-13 DIAGNOSIS — I5021 Acute systolic (congestive) heart failure: Secondary | ICD-10-CM | POA: Diagnosis not present

## 2015-09-13 DIAGNOSIS — Z951 Presence of aortocoronary bypass graft: Secondary | ICD-10-CM | POA: Diagnosis not present

## 2015-09-13 DIAGNOSIS — I519 Heart disease, unspecified: Secondary | ICD-10-CM | POA: Insufficient documentation

## 2015-09-13 DIAGNOSIS — R0602 Shortness of breath: Secondary | ICD-10-CM

## 2015-09-13 HISTORY — PX: CARDIAC CATHETERIZATION: SHX172

## 2015-09-13 LAB — POCT I-STAT 3, VENOUS BLOOD GAS (G3P V)
Acid-Base Excess: 3 mmol/L — ABNORMAL HIGH (ref 0.0–2.0)
Bicarbonate: 27.7 mEq/L — ABNORMAL HIGH (ref 20.0–24.0)
O2 SAT: 64 %
PH VEN: 7.444 — AB (ref 7.250–7.300)
TCO2: 29 mmol/L (ref 0–100)
pCO2, Ven: 40.5 mmHg — ABNORMAL LOW (ref 45.0–50.0)
pO2, Ven: 32 mmHg (ref 31.0–45.0)

## 2015-09-13 LAB — POCT I-STAT 3, ART BLOOD GAS (G3+)
ACID-BASE EXCESS: 3 mmol/L — AB (ref 0.0–2.0)
Bicarbonate: 25.9 mEq/L — ABNORMAL HIGH (ref 20.0–24.0)
O2 SAT: 99 %
PO2 ART: 138 mmHg — AB (ref 80.0–100.0)
TCO2: 27 mmol/L (ref 0–100)
pCO2 arterial: 31.9 mmHg — ABNORMAL LOW (ref 35.0–45.0)
pH, Arterial: 7.517 — ABNORMAL HIGH (ref 7.350–7.450)

## 2015-09-13 LAB — GLUCOSE, CAPILLARY
Glucose-Capillary: 287 mg/dL — ABNORMAL HIGH (ref 65–99)
Glucose-Capillary: 262 mg/dL — ABNORMAL HIGH (ref 65–99)

## 2015-09-13 SURGERY — RIGHT/LEFT HEART CATH AND CORONARY/GRAFT ANGIOGRAPHY

## 2015-09-13 MED ORDER — SODIUM CHLORIDE 0.9% FLUSH: 3.0000 mL | INTRAVENOUS | Status: DC | PRN

## 2015-09-13 MED ORDER — ACETAMINOPHEN 325 MG PO TABS: 650.0000 mg | ORAL_TABLET | ORAL | Status: DC | PRN

## 2015-09-13 MED ORDER — ONDANSETRON HCL 4 MG/2ML IJ SOLN: 4.0000 mg | Freq: Four times a day (QID) | INTRAMUSCULAR | Status: DC | PRN

## 2015-09-13 MED ORDER — OXYCODONE-ACETAMINOPHEN 5-325 MG PO TABS: 1.0000 | ORAL_TABLET | ORAL | Status: DC | PRN

## 2015-09-13 MED ORDER — HEPARIN (PORCINE) IN NACL 2-0.9 UNIT/ML-% IJ SOLN
INTRAMUSCULAR | Status: AC
  Filled 2015-09-13: qty 1500

## 2015-09-13 MED ORDER — IOHEXOL 350 MG/ML SOLN
INTRAVENOUS | Status: DC | PRN
  Administered 2015-09-13: 100 mL via INTRA_ARTERIAL

## 2015-09-13 MED ORDER — SODIUM CHLORIDE 0.9 % IV SOLN
INTRAVENOUS | Status: DC
  Administered 2015-09-13: 09:00:00 via INTRAVENOUS

## 2015-09-13 MED ORDER — LIDOCAINE HCL (PF) 1 % IJ SOLN
INTRAMUSCULAR | Status: DC | PRN
  Administered 2015-09-13: 14 mL via SUBCUTANEOUS

## 2015-09-13 MED ORDER — SODIUM CHLORIDE 0.9 % IV SOLN: 250.0000 mL | INTRAVENOUS | Status: DC | PRN

## 2015-09-13 MED ORDER — ASPIRIN 81 MG PO CHEW: 81.0000 mg | CHEWABLE_TABLET | ORAL | Status: DC

## 2015-09-13 MED ORDER — LIDOCAINE HCL (PF) 1 % IJ SOLN
INTRAMUSCULAR | Status: AC
Start: 2015-09-13 — End: 2015-09-13
  Filled 2015-09-13: qty 30

## 2015-09-13 MED ORDER — FENTANYL CITRATE (PF) 100 MCG/2ML IJ SOLN
INTRAMUSCULAR | Status: AC
  Filled 2015-09-13: qty 2

## 2015-09-13 MED ORDER — MIDAZOLAM HCL 2 MG/2ML IJ SOLN
INTRAMUSCULAR | Status: DC | PRN
  Administered 2015-09-13: 1 mg via INTRAVENOUS

## 2015-09-13 MED ORDER — SODIUM CHLORIDE 0.9% FLUSH: 3.0000 mL | Freq: Two times a day (BID) | INTRAVENOUS | Status: DC

## 2015-09-13 MED ORDER — MIDAZOLAM HCL 2 MG/2ML IJ SOLN
INTRAMUSCULAR | Status: AC
  Filled 2015-09-13: qty 2

## 2015-09-13 MED ORDER — IOPAMIDOL (ISOVUE-370) INJECTION 76%
INTRAVENOUS | Status: DC | PRN
  Administered 2015-09-13: 10 mL

## 2015-09-13 MED ORDER — FENTANYL CITRATE (PF) 100 MCG/2ML IJ SOLN
INTRAMUSCULAR | Status: DC | PRN
  Administered 2015-09-13: 25 ug via INTRAVENOUS

## 2015-09-13 MED ORDER — HEPARIN (PORCINE) IN NACL 2-0.9 UNIT/ML-% IJ SOLN
INTRAMUSCULAR | Status: DC | PRN
  Administered 2015-09-13: 1000 mL

## 2015-09-13 MED ORDER — IOPAMIDOL (ISOVUE-370) INJECTION 76%
INTRAVENOUS | Status: AC
Start: 2015-09-13 — End: 2015-09-13
  Filled 2015-09-13: qty 50

## 2015-09-13 SURGICAL SUPPLY — 12 items
CATH INFINITI 5FR MULTPACK ANG (CATHETERS) ×3 IMPLANT
CATH SWAN GANZ 7F STRAIGHT (CATHETERS) ×3 IMPLANT
KIT HEART LEFT (KITS) ×3 IMPLANT
KIT HEART RIGHT NAMIC (KITS) ×3 IMPLANT
PACK CARDIAC CATHETERIZATION (CUSTOM PROCEDURE TRAY) ×3 IMPLANT
SHEATH PINNACLE 5F 10CM (SHEATH) ×3 IMPLANT
SHEATH PINNACLE 7F 10CM (SHEATH) ×3 IMPLANT
SYR MEDRAD MARK V 150ML (SYRINGE) ×3 IMPLANT
TRANSDUCER W/STOPCOCK (MISCELLANEOUS) ×3 IMPLANT
TUBING CIL FLEX 10 FLL-RA (TUBING) ×3 IMPLANT
WIRE EMERALD 3MM-J .025X260CM (WIRE) ×3 IMPLANT
WIRE EMERALD 3MM-J .035X150CM (WIRE) ×3 IMPLANT

## 2015-09-13 NOTE — Progress Notes (Signed)
Site area: rfa / rfv Site Prior to Removal:  Level 0 Pressure Applied For:20 min Manual:  yes  Patient Status During Pull:  stable Post Pull Site:  Level 0 Post Pull Instructions Given:  yes Post Pull Pulses Present: doppler Dressing Applied: tegaderm  Bedrest begins @ 1250 till 1650 Comments:

## 2015-09-13 NOTE — Discharge Instructions (Signed)
Coronary Angiogram A coronary angiogram, also called coronary angiography, is an X-ray procedure used to look at the arteries in the heart. In this procedure, a dye (contrast dye) is injected through a long, hollow tube (catheter). The catheter is about the size of a piece of cooked spaghetti and is inserted through your groin, wrist, or arm. The dye is injected into each artery, and X-rays are then taken to show if there is a blockage in the arteries of your heart. LET [facility] CARE PROVIDER KNOW ABOUT:  Any allergies you have, including allergies to shellfish or contrast dye.   All medicines you are taking, including vitamins, herbs, eye drops, creams, and over-the-counter medicines.   Previous problems you or members of your family have had with the use of anesthetics.   Any blood disorders you have.   Previous surgeries you have had.  History of kidney problems or failure.   Other medical conditions you have. RISKS AND COMPLICATIONS  Generally, a coronary angiogram is a safe procedure. However, problems can occur and include:  Allergic reaction to the dye.  Bleeding from the access site or other locations.  Kidney injury, especially in people with impaired kidney function.  Stroke (rare).  Heart attack (rare). BEFORE THE PROCEDURE   Do not eat or drink anything after midnight the night before the procedure or as directed by your health care provider.   Ask your health care provider about changing or stopping your regular medicines. This is especially important if you are taking diabetes medicines or blood thinners. PROCEDURE  You may be given a medicine to help you relax (sedative) before the procedure. This medicine is given through an intravenous (IV) access tube that is inserted into one of your veins.   The area where the catheter will be inserted will be washed and shaved. This is usually done in the groin but may be done in the fold of your arm (near your  elbow) or in the wrist.   A medicine will be given to numb the area where the catheter will be inserted (local anesthetic).   The health care provider will insert the catheter into an artery. The catheter will be guided by using a special type of X-ray (fluoroscopy) of the blood vessel being examined.   A special dye will then be injected into the catheter, and X-rays will be taken. The dye will help to show where any narrowing or blockages are located in the heart arteries.  AFTER THE PROCEDURE   If the procedure is done through the leg, you will be kept in bed lying flat for several hours. You will be instructed to not bend or cross your legs.  The insertion site will be checked frequently.   The pulse in your feet or wrist will be checked frequently.   Additional blood tests, X-rays, and an electrocardiogram may be done.    This information is not intended to replace advice given to you by your health care provider. Make sure you discuss any questions you have with your health care provider.   Document Released: 12/20/2002 Document Revised: 07/06/2014 Document Reviewed: 11/07/2012 Elsevier Interactive Patient Education 2016 [employer] After Refer to this sheet in the next few weeks. These instructions provide you with information about caring for yourself after your procedure. Your health care provider may also give you more specific instructions. Your treatment has been planned according to current medical practices, but problems sometimes occur. Call your health care provider if  you have any problems or questions after your procedure. WHAT TO EXPECT AFTER THE PROCEDURE After your procedure, it is typical to have the following:  Bruising at the catheter insertion site that usually fades within 1-2 weeks.  Blood collecting in the tissue (hematoma) that may be painful to the touch. It should usually decrease in size and tenderness within 1-2 weeks. HOME CARE  INSTRUCTIONS  Take medicines only as directed by your health care provider.  You may shower 24-48 hours after the procedure or as directed by your health care provider. Remove the bandage (dressing) and gently wash the site with plain soap and water. Pat the area dry with a clean towel. Do not rub the site, because this may cause bleeding.  Do not take baths, swim, or use a hot tub until your health care provider approves.  Check your insertion site every day for redness, swelling, or drainage.  Do not apply powder or lotion to the site.  Do not lift over 10 lb (4.5 kg) for 5 days after your procedure or as directed by your health care provider.  Ask your health care provider when it is okay to:  Return to work or school.  Resume usual physical activities or sports.  Resume sexual activity.  Do not drive home if you are discharged the same day as the procedure. Have someone else drive you.  You may drive 24 hours after the procedure unless otherwise instructed by your health care provider.  Do not operate machinery or power tools for 24 hours after the procedure or as directed by your health care provider.  If your procedure was done as an outpatient procedure, which means that you went home the same day as your procedure, a responsible adult should be with you for the first 24 hours after you arrive home.  Keep all follow-up visits as directed by your health care provider. This is important. SEEK MEDICAL CARE IF:  You have a fever.  You have chills.  You have increased bleeding from the catheter insertion site. Hold pressure on the site. SEEK IMMEDIATE MEDICAL CARE IF:  You have unusual pain at the catheter insertion site.  You have redness, warmth, or swelling at the catheter insertion site.  You have drainage (other than a small amount of blood on the dressing) from the catheter insertion site.  The catheter insertion site is bleeding, and the bleeding does not stop  after 30 minutes of holding steady pressure on the site.  The area near or just beyond the catheter insertion site becomes pale, cool, tingly, or numb.   This information is not intended to replace advice given to you by your health care provider. Make sure you discuss any questions you have with your health care provider.   Document Released: 01/01/2005 Document Revised: 07/06/2014 Document Reviewed: 11/16/2012 Elsevier Interactive Patient Education [employer].

## 2015-09-13 NOTE — H&P (Addendum)
     INTERVAL PROCEDURE H&P  History and Physical Interval Note:  09/13/2015 9:03 AM  [name] has presented today for their planned procedure. The various methods of treatment have been discussed with the patient and family. After consideration of risks, benefits and other options for treatment, the patient has consented to the procedure.  The patients' outpatient history has been reviewed, patient examined, and no change in status from most recent office note within the past 30 days. I have reviewed the patients' chart and labs and will proceed as planned. Questions were answered to the patient's satisfaction.  Cath Lab Visit (complete for each Cath Lab visit)  Clinical Evaluation Leading to the Procedure:   ACS: No.  Non-ACS:    Anginal Classification: No Symptoms  Anti-ischemic medical therapy: Maximal Therapy (2 or more classes of medications)  Non-Invasive Test Results: No non-invasive testing performed  Prior CABG: Previous CABG  [name], MD, [facility] Attending Cardiologist [place] C [name] 09/13/2015, 9:03 AM

## 2015-09-14 ENCOUNTER — Encounter (HOSPITAL_COMMUNITY): Payer: Self-pay | Admitting: Internal Medicine

## 2015-09-19 DIAGNOSIS — G5732 Lesion of lateral popliteal nerve, left lower limb: Secondary | ICD-10-CM | POA: Diagnosis not present

## 2015-09-19 DIAGNOSIS — M9906 Segmental and somatic dysfunction of lower extremity: Secondary | ICD-10-CM | POA: Diagnosis not present

## 2015-09-19 DIAGNOSIS — M21372 Foot drop, left foot: Secondary | ICD-10-CM | POA: Diagnosis not present

## 2015-09-19 DIAGNOSIS — M791 Myalgia: Secondary | ICD-10-CM | POA: Diagnosis not present

## 2015-09-19 DIAGNOSIS — M25672 Stiffness of left ankle, not elsewhere classified: Secondary | ICD-10-CM | POA: Diagnosis not present

## 2015-09-19 DIAGNOSIS — R201 Hypoesthesia of skin: Secondary | ICD-10-CM | POA: Diagnosis not present

## 2015-09-25 ENCOUNTER — Ambulatory Visit: Payer: Federal, State, Local not specified - PPO | Admitting: Internal Medicine

## 2015-09-25 DIAGNOSIS — I255 Ischemic cardiomyopathy: Secondary | ICD-10-CM | POA: Diagnosis not present

## 2015-09-25 DIAGNOSIS — G4733 Obstructive sleep apnea (adult) (pediatric): Secondary | ICD-10-CM | POA: Diagnosis not present

## 2015-09-25 DIAGNOSIS — I5022 Chronic systolic (congestive) heart failure: Secondary | ICD-10-CM | POA: Diagnosis not present

## 2015-09-25 DIAGNOSIS — F411 Generalized anxiety disorder: Secondary | ICD-10-CM | POA: Diagnosis not present

## 2015-09-25 DIAGNOSIS — I5033 Acute on chronic diastolic (congestive) heart failure: Secondary | ICD-10-CM | POA: Diagnosis not present

## 2015-09-25 DIAGNOSIS — Z13220 Encounter for screening for lipoid disorders: Secondary | ICD-10-CM | POA: Diagnosis not present

## 2015-09-26 ENCOUNTER — Encounter: Payer: Self-pay | Admitting: Internal Medicine

## 2015-09-26 ENCOUNTER — Ambulatory Visit (INDEPENDENT_AMBULATORY_CARE_PROVIDER_SITE_OTHER): Payer: Medicare Other | Admitting: Internal Medicine

## 2015-09-26 VITALS — BP 126/62 | HR 76 | Ht 74.0 in | Wt 240.1 lb

## 2015-09-26 DIAGNOSIS — Z79899 Other long term (current) drug therapy: Secondary | ICD-10-CM | POA: Diagnosis not present

## 2015-09-26 DIAGNOSIS — I951 Orthostatic hypotension: Secondary | ICD-10-CM

## 2015-09-26 DIAGNOSIS — R0602 Shortness of breath: Secondary | ICD-10-CM

## 2015-09-26 DIAGNOSIS — I255 Ischemic cardiomyopathy: Secondary | ICD-10-CM | POA: Diagnosis not present

## 2015-09-26 DIAGNOSIS — I2510 Atherosclerotic heart disease of native coronary artery without angina pectoris: Secondary | ICD-10-CM

## 2015-09-26 DIAGNOSIS — I10 Essential (primary) hypertension: Secondary | ICD-10-CM | POA: Diagnosis not present

## 2015-09-26 DIAGNOSIS — I5021 Acute systolic (congestive) heart failure: Secondary | ICD-10-CM

## 2015-09-26 DIAGNOSIS — Z951 Presence of aortocoronary bypass graft: Secondary | ICD-10-CM

## 2015-09-26 LAB — BASIC METABOLIC PANEL
BUN: 22 mg/dL (ref 7–25)
CHLORIDE: 102 mmol/L (ref 98–110)
CO2: 24 mmol/L (ref 20–31)
CREATININE: 0.91 mg/dL (ref 0.70–1.18)
Calcium: 8.9 mg/dL (ref 8.6–10.3)
Glucose, Bld: 242 mg/dL — ABNORMAL HIGH (ref 65–99)
Potassium: 4.8 mmol/L (ref 3.5–5.3)
Sodium: 137 mmol/L (ref 135–146)

## 2015-09-26 LAB — BRAIN NATRIURETIC PEPTIDE: BRAIN NATRIURETIC PEPTIDE: 239.4 pg/mL — AB (ref <100)

## 2015-09-26 NOTE — Patient Instructions (Signed)
Your physician recommends that you return for lab work TODAY - first floor, [address] BMET, BNP  Your physician recommends that you schedule a follow-up appointment in [place]

## 2015-09-26 NOTE — Progress Notes (Signed)
OFFICE NOTE  Chief Complaint:  Follow-up cath  Primary Care Physician: [name] [name], MD  HPI:  [name] [name] is a pleasant 70 year old gentleman previously followed by Dr. Rollene Fare. His past medical history is significant for coronary artery disease. In 2013 he underwent multivessel CABG for an ischemic cardiomyopathy. EF was 20-25% prior to surgery however post surgery his EF had improved up to 45-50%. Recently his EF was 40-45% by echo in May of 2014. There was mild mitral regurgitation, mild to moderately dilated left atrium and mild LVH. [name] has been describing some anxiety. He also reports some pain in his legs however underwent Dopplers in September 2014 which showed preserved ABIs bilaterally a 1.0 on the right and 1.1 on the left. The bilateral peroneal and posterior tibial arteries were occluded. He reports some optimal control of his diabetes. His A1c was 7.2. He is concerned about the cost of taking Tradjenta. He is followed by Dr. Elyse Hsu.   He was recently seen in the emergency room and he can hospital. There he presented with hypotension and was given 1 L normal saline and his symptoms improved. He had been having dizziness as well as some upper back pain into the left shoulder blade with exertion recently. The symptoms are worse particularly when climbing up ladders or going up stairs and are relieved at rest. After that hospitalization it was recommended that he discontinue his ACE inhibitor and beta blocker, and his blood pressure appears improved today up to 110/60.  [name] [name] returns today for followup of his stress test. This is interpreted as nonischemic with an EF of 44%. He reports still feeling very fatigued and extremely short of breath when doing activities. He says that the other day he tried to hit golf balls and felt that he was totally exhausted after just an hour. He reports his blood pressure has improved somewhat off of all medications however  remains low. He has had symptoms of orthostatic hypotension in the past. His symptoms do feel similar to prior to his bypass surgery. He also feels that he was much better after surgery than he is now and that his symptoms have been going on for the past 2 months.  At his last office visit, I recommended that Mr. Brutto have a repeat cardiac catheterization (08/2013). This demonstrated the following:  Impression:  1. 2 vessel native CAD with occluded LAD in the mid-vessel  2. Patent LIMA-LAD, SVG to PLA and SVG to PDA grafts with TIMI III flow  3. LVEDP = 13 mmHg  4. Random PVC's were noted  Based on these findings, I could not find any new cardiac cause of his symptoms. I suspect that some of his fatigue could be related to labile blood sugars. He says unexplained hypotension but is normotensive off of medications.  [name] returns today for followup. He now reports feeling better since he had change in his medications. He is established with Dr. Buddy Duty who adjusted his diabetes medicines and stopped his Invokana. His shortness of breath and fatigue in both improved. He is now been expected some problems with left heel pain. He was found to have some spinal stenosis but has had improvement with inserts in his shoes.  I saw [name] back in the office today. He has successfully underwent back surgery earlier this year which she says initially was much improved, however he says he subsequently developed more pain going down his legs. This sounds like it's  neuropathic pain, but is reluctant to try medication for it. He also significantly fatigue. This could be due to a number of etiologies. 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 Also, he is noted to have a history of obstructive sleep apnea. He was placed on BiPAP, but has not been compliant with that due to difficulty wearing the mask. He also  never had follow-up of his sleep study.  [name] returns today for follow-up. He reports he's had worsening dyspnea and fatigue. He says that he can hardly sleep at night. He is struggling with significant anxiety. 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 He's not been wearing the BiPAP due to significant anxiety and difficulty sleeping. He feels like he gets smothered with his machine. Communication between Dr. Radford Pax and Dr. Quintin Alto led to the starting of Celexa as well as Klonopin to use as needed at night. He says that it does give him about 6 hours of sleep. He has not been using his BiPAP as instructed. He is reportedly had more worsening shortness of breath. He was seen in the ER for this and it was thought this was due [email or web address] with BiPAP. His BNP however is elevated over 300. Today in the office he says that he's had more swelling and more abdominal fullness as well as leg edema.  I had the pleasure seeing [name] back today in the office. He seems to respond nicely to Lasix. I started him on 40 mg daily and while I was out of town my partner reviewed his lab work which included an elevated BNP over 300. He increased his Lasix to 40 mg twice a day. [name] says he's had a significant improvement in his breathing. The BNP now is in the mid 200s. Renal function is stable based on labs today. Unfortunately, his echocardiogram does show a new cardiomyopathy with EF 30-35%. It should be noted that he felt "awful" on blood pressure/heart failure medicines and had taken himself off of ACE inhibitor and beta blocker in the past. The echo however does suggest inferior and anterolateral wall motion abnormalities which are new and could indicate graft dysfunction. I performed his last heart catheterization in 2015 which showed all 3 grafts patent.  [name] returns today for follow-up of his left heart catheterization. I perform  this a few weeks ago and he had patent bypass grafts however LV function is reduced. Cardiac output is also reduced. Filling pressures were mildly elevated. He reports since discharge that he's had some improvement of his symptoms. He was started on low-dose beta blocker but he does report fatigue. When I asked him how they compared to previously he said he thinks attack sleep better since he started on the beta blocker but not back to what he thinks his baseline should be. He continues to have problems with left leg pain. He had ultrasound of the left leg which were unrevealing. He also had nerve conduction studies which I sent him for which were not suggestive of neuropathy. His symptoms were worse while lying flat on the Cath Lab table and I suspect this could be again coming from his back and he may need repeat orthopedic evaluation. His primary care provider started him on Celexa recently as well for anxiety.  PMHx:  Past Medical History  Diagnosis Date  .  Diabetes mellitus   . Coronary artery disease   . High cholesterol   . Ischemic cardiomyopathy   . CAD (coronary artery disease) 07/06/2011  . LV dysfunction 07/06/2011  . Stroke [facility])     [place]   . CHF (congestive heart failure) ([place])   . Arthritis     Knee L - probably  . 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 Ellison Hughs  . BPH (benign prostatic hyperplasia)   . History of nuclear stress test 09/2010    dipyridamole; mild perfusion defect due to attenuation with mild superimposed ischemia in apical septal, apical, apical inferior & apical lateral regions; rest LV enlarged in size; prominent gut uptake in infero-apical region; no significant ischemia demonstrated; low risk scan   . Right bundle branch block   . Spinal stenosis of lumbar region   . HNP (herniated nucleus pulposus), lumbar   . Nocturia   . Frequency of urination   . Sleep apnea      sleep study 10/2010- AHI during total sleep 32.1/hr and during REM 62.3/hr (severe sleep apnea)unable to tolerate c pap    Past Surgical History  Procedure Laterality Date  . Cardiac catheterization  01/2011    ischemic cardiomyopathy 30-35%, multivessel CAD (Dr. [name])   . Pilonidal cyst removed    . Colonscopy    . Transthoracic echocardiogram  11/10/2012    EF 40-45%, mild LVH, mild hypokinesis of anteroseptal myocardium, grade 1 diastolic dysfunction; mild MR & calcifed mitral annulus; LA mild-mod dilated; RA mildly dilated  . Lower extremity arterial doppler  03/16/2013    bilat ABIs demonstated normal values; R runoff - posterior tibial & anterior tibial arteries occluded; L runoff - peroneal & posterior tibial arteries occluded, anterior tibial artery appears occluded  . Left heart catheterization with coronary/graft angiogram N/A 09/20/2013    Procedure: LEFT HEART CATHETERIZATION WITH [name];  Surgeon: [name], MD;  Location: [facility] CATH LAB;  Service: Cardiovascular;  Laterality: N/A;  . Coronary artery bypass graft  08/03/2011    Procedure: CORONARY ARTERY BYPASS GRAFTING (CABG);  Surgeon: [name], MD;  Location: [facility];  Service: Open Heart Surgery;  Laterality: N/A;  CABG times three using right saphenous vein and left mammary artery usisng endoscope.  . Lumbar laminectomy/decompression microdiscectomy N/A 09/12/2014    Procedure: LUMBAR DECOMPRESSION L3-L4, L4-L5, MICRODISCECTOMY L4-L5;  Surgeon: [name], MD;  Location: [facility] ORS;  Service: Orthopedics;  Laterality: N/A;  . Cardiac catheterization  09/13/2015    Procedure: Right/Left Heart Cath and Coronary/Graft Angiography;  Surgeon: [name], MD;  Location: [place] CV LAB;  Service: Cardiovascular;;    FAMHx:  Family History  Problem Relation Age of Onset  . Anesthesia problems Daughter   . Cancer Mother   . Lung disease Father     & heart disease  . Colon cancer Sister   . Sudden  death Maternal Grandmother     SOCHx:   reports that he quit smoking about 4 years ago. His smoking use included Cigars. He quit smokeless tobacco use about 3 years ago. He reports that he drinks alcohol. He reports that he does not use illicit drugs.  ALLERGIES:  No Known Allergies  ROS: Pertinent items noted in HPI and remainder of comprehensive ROS otherwise negative.  HOME MEDS: Current Outpatient Prescriptions  Medication Sig Dispense Refill  . aspirin EC 81  MG tablet Take 81 mg by mouth daily.    . carvedilol (COREG) 6.25 MG tablet Take 1 tablet (6.25 mg total) by mouth 2 (two) times daily. 60 tablet 5  . citalopram (CELEXA) 20 MG tablet Take 1 tablet by mouth daily.    . clonazePAM (KLONOPIN) 0.5 MG tablet Take 1 tablet by mouth daily.    . furosemide (LASIX) 40 MG tablet Take 40 mg by mouth 2 (two) times daily.    Marland Kitchen glipiZIDE (GLUCOTROL XL) 10 MG 24 hr tablet Take 10 mg by mouth every morning.     . metFORMIN (GLUCOPHAGE-XR) 500 MG 24 hr tablet Take 1,000 mg by mouth 2 (two) times daily.    . polycarbophil (FIBERCON) 625 MG tablet Take 625 mg by mouth as needed for mild constipation or moderate constipation.     . rosuvastatin (CRESTOR) 20 MG tablet Take 10 mg by mouth every evening.     Marland Kitchen spironolactone (ALDACTONE) 25 MG tablet Take 25 mg by mouth daily.    . TRADJENTA 5 MG TABS tablet Take 5 mg by mouth daily.    . tamsulosin (FLOMAX) 0.4 MG CAPS capsule Take 1 capsule by mouth every evening.      No current facility-administered medications for this visit.    LABS/IMAGING: No results found for this or any previous visit (from the past 48 hour(s)). No results found.  VITALS: BP 126/62 mmHg  Pulse 76  Ht 6\' 2"  (1.88 m)  Wt 240 lb 2 oz (108.92 kg)  BMI 30.82 kg/m2  SpO2 96%  EXAM: General appearance: alert and no distress Neck: no carotid bruit and no JVD Lungs: clear to auscultation bilaterally Heart: regular rate and rhythm, S1, S2 normal, no murmur, click,  rub or gallop Abdomen: soft, non-tender; bowel sounds normal; no masses,  no organomegaly Extremities: extremities normal, atraumatic, no cyanosis or edema Pulses: 2+ and symmetric 2+ bilateral DP, 2+ L PT, 1+ R PT Skin: Skin color, texture, turgor normal. No rashes or lesions Neurologic: Grossly normal Psych: Pleasant  EKG: Deferred  ASSESSMENT: 1. Acute on chronic systolic congestive heart failure - EF 30-35% 2. Coronary artery disease status post three-vessel CABG in 2013 (LIMA to LAD, SVG to PDA and SVG to PLA) - patent grafts by cath in 08/2015 3. Hypertension-controlled 4. Dyslipidemia 5. Diabetes type 2 - better controlled 6. Mild PVD-with normal ABI 7. Hypotension/upper back pain - resolved 8. Spinal stenosis - s/p surgery 9. Trifascicular block 10. OSA-back on BiPAP  PLAN: 1.   Mr. Yellen seems to be euvolemic on his current diuretics. We'll go ahead and check a BNP and BMP to make sure is not being over diuresis disease feels somewhat fatigued. I thought this might be the beta blocker but he thinks his symptoms are actually not as bad on the beta blocker. I believe the cardia myopathy developed as a result of him being off of his medicines due to what was thought to be orthostatic hypotension. He did have a history of some heart failure with EF 40-45% and now it is reduced to 30-35%. Hopefully with the reinitiation of beta blocker and in the future if we can add some additional medicine such as ACE inhibitor or ARB we may be able to improve his LV function. Plan follow-up in 3 months.  [name], MD, [facility] Attending Cardiologist Penryn C Deshondra Worst 09/26/2015, 10:48 AM

## 2015-09-27 ENCOUNTER — Other Ambulatory Visit: Payer: Self-pay | Admitting: *Deleted

## 2015-09-27 MED ORDER — FUROSEMIDE 40 MG PO TABS: 40.0000 mg | ORAL_TABLET | Freq: Two times a day (BID) | ORAL | Status: DC

## 2015-09-27 NOTE — Telephone Encounter (Signed)
Rx request sent to pharmacy.  

## 2015-09-30 ENCOUNTER — Telehealth: Payer: Self-pay | Admitting: Internal Medicine

## 2015-09-30 NOTE — Telephone Encounter (Signed)
Spoke to patient. LABS Result given . Verbalized understanding  

## 2015-09-30 NOTE — Telephone Encounter (Signed)
F/u  Pt returning call- lab work. Please call back and discuss.

## 2015-10-02 ENCOUNTER — Ambulatory Visit (HOSPITAL_BASED_OUTPATIENT_CLINIC_OR_DEPARTMENT_OTHER): Payer: Federal, State, Local not specified - PPO

## 2015-10-16 ENCOUNTER — Other Ambulatory Visit: Payer: Self-pay | Admitting: Internal Medicine

## 2015-10-16 NOTE — Telephone Encounter (Signed)
Rx request sent to pharmacy.  

## 2015-11-18 DIAGNOSIS — I5022 Chronic systolic (congestive) heart failure: Secondary | ICD-10-CM | POA: Diagnosis not present

## 2015-11-18 DIAGNOSIS — E1140 Type 2 diabetes mellitus with diabetic neuropathy, unspecified: Secondary | ICD-10-CM | POA: Diagnosis not present

## 2015-11-18 DIAGNOSIS — E7800 Pure hypercholesterolemia, unspecified: Secondary | ICD-10-CM | POA: Diagnosis not present

## 2015-11-18 DIAGNOSIS — I10 Essential (primary) hypertension: Secondary | ICD-10-CM | POA: Diagnosis not present

## 2015-11-18 DIAGNOSIS — F411 Generalized anxiety disorder: Secondary | ICD-10-CM | POA: Diagnosis not present

## 2015-11-20 DIAGNOSIS — F411 Generalized anxiety disorder: Secondary | ICD-10-CM | POA: Diagnosis not present

## 2015-11-20 DIAGNOSIS — E1140 Type 2 diabetes mellitus with diabetic neuropathy, unspecified: Secondary | ICD-10-CM | POA: Diagnosis not present

## 2015-11-20 DIAGNOSIS — G4733 Obstructive sleep apnea (adult) (pediatric): Secondary | ICD-10-CM | POA: Diagnosis not present

## 2015-11-20 DIAGNOSIS — E1165 Type 2 diabetes mellitus with hyperglycemia: Secondary | ICD-10-CM | POA: Diagnosis not present

## 2015-11-20 DIAGNOSIS — I255 Ischemic cardiomyopathy: Secondary | ICD-10-CM | POA: Diagnosis not present

## 2015-11-20 DIAGNOSIS — Z1389 Encounter for screening for other disorder: Secondary | ICD-10-CM | POA: Diagnosis not present

## 2015-11-20 DIAGNOSIS — I5022 Chronic systolic (congestive) heart failure: Secondary | ICD-10-CM | POA: Diagnosis not present

## 2015-11-20 DIAGNOSIS — I5033 Acute on chronic diastolic (congestive) heart failure: Secondary | ICD-10-CM | POA: Diagnosis not present

## 2015-11-21 DIAGNOSIS — M9906 Segmental and somatic dysfunction of lower extremity: Secondary | ICD-10-CM | POA: Diagnosis not present

## 2015-11-21 DIAGNOSIS — M21372 Foot drop, left foot: Secondary | ICD-10-CM | POA: Diagnosis not present

## 2015-11-21 DIAGNOSIS — M791 Myalgia: Secondary | ICD-10-CM | POA: Diagnosis not present

## 2015-11-21 DIAGNOSIS — R201 Hypoesthesia of skin: Secondary | ICD-10-CM | POA: Diagnosis not present

## 2015-11-21 DIAGNOSIS — G5732 Lesion of lateral popliteal nerve, left lower limb: Secondary | ICD-10-CM | POA: Diagnosis not present

## 2015-11-21 DIAGNOSIS — M25672 Stiffness of left ankle, not elsewhere classified: Secondary | ICD-10-CM | POA: Diagnosis not present

## 2015-12-24 ENCOUNTER — Encounter: Payer: Self-pay | Admitting: Internal Medicine

## 2015-12-24 ENCOUNTER — Ambulatory Visit (INDEPENDENT_AMBULATORY_CARE_PROVIDER_SITE_OTHER): Payer: Medicare Other | Admitting: Internal Medicine

## 2015-12-24 VITALS — BP 110/50 | HR 61 | Ht 74.0 in | Wt 245.6 lb

## 2015-12-24 DIAGNOSIS — I429 Cardiomyopathy, unspecified: Secondary | ICD-10-CM | POA: Diagnosis not present

## 2015-12-24 DIAGNOSIS — Z79899 Other long term (current) drug therapy: Secondary | ICD-10-CM | POA: Diagnosis not present

## 2015-12-24 DIAGNOSIS — R0602 Shortness of breath: Secondary | ICD-10-CM

## 2015-12-24 DIAGNOSIS — I519 Heart disease, unspecified: Secondary | ICD-10-CM

## 2015-12-24 DIAGNOSIS — I255 Ischemic cardiomyopathy: Secondary | ICD-10-CM | POA: Diagnosis not present

## 2015-12-24 DIAGNOSIS — I428 Other cardiomyopathies: Secondary | ICD-10-CM

## 2015-12-24 MED ORDER — FUROSEMIDE 40 MG PO TABS: 40.0000 mg | ORAL_TABLET | Freq: Every day | ORAL | Status: DC

## 2015-12-24 NOTE — Patient Instructions (Signed)
Medication Instructions:  DECREASE Furosemide 40 mg daily  Labwork: BMP and BNP in 1 Week  Testing/Procedures: Your physician has requested that you have an echocardiogram in 3 Months. Echocardiography is a painless test that uses sound waves to create images of your heart. It provides your doctor with information about the size and shape of your heart and how well your heart's chambers and valves are working. This procedure takes approximately one hour. There are no restrictions for this procedure.  Follow-Up: Your physician recommends that you schedule a follow-up appointment in: 3 Months   Any Other Special Instructions Will Be Listed Below (If Applicable).   If you need a refill on your cardiac medications before your next appointment, please call your pharmacy.

## 2015-12-24 NOTE — Progress Notes (Signed)
OFFICE NOTE  Chief Complaint:  Follow-up cath  Primary Care Physician: [name] [name], MD  HPI:  [name] [name] is a pleasant 70 year old gentleman previously followed by Dr. Rollene Fare. His past medical history is significant for coronary artery disease. In 2013 he underwent multivessel CABG for an ischemic cardiomyopathy. EF was 20-25% prior to surgery however post surgery his EF had improved up to 45-50%. Recently his EF was 40-45% by echo in May of 2014. There was mild mitral regurgitation, mild to moderately dilated left atrium and mild LVH. [name] [name] has been describing some anxiety. He also reports some pain in his legs however underwent Dopplers in September 2014 which showed preserved ABIs bilaterally a 1.0 on the right and 1.1 on the left. The bilateral peroneal and posterior tibial arteries were occluded. He reports some optimal control of his diabetes. His A1c was 7.2. He is concerned about the cost of taking Tradjenta. He is followed by Dr. Elyse Hsu.   He was recently seen in the emergency room and he can hospital. There he presented with hypotension and was given 1 L normal saline and his symptoms improved. He had been having dizziness as well as some upper back pain into the left shoulder blade with exertion recently. The symptoms are worse particularly when climbing up ladders or going up stairs and are relieved at rest. After that hospitalization it was recommended that he discontinue his ACE inhibitor and beta blocker, and his blood pressure appears improved today up to 110/60.  [name] [name] returns today for followup of his stress test. This is interpreted as nonischemic with an EF of 44%. He reports still feeling very fatigued and extremely short of breath when doing activities. He says that the other day he tried to hit golf balls and felt that he was totally exhausted after just an hour. He reports his blood pressure has improved somewhat off of all medications however  remains low. He has had symptoms of orthostatic hypotension in the past. His symptoms do feel similar to prior to his bypass surgery. He also feels that he was much better after surgery than he is now and that his symptoms have been going on for the past 2 months.  At his last office visit, I recommended that [name] [name] have a repeat cardiac catheterization (08/2013). This demonstrated the following:  Impression:  1. 2 vessel native CAD with occluded LAD in the mid-vessel  2. Patent LIMA-LAD, SVG to PLA and SVG to PDA grafts with TIMI III flow  3. LVEDP = 13 mmHg  4. Random PVC's were noted  Based on these findings, I could not find any new cardiac cause of his symptoms. I suspect that some of his fatigue could be related to labile blood sugars. He says unexplained hypotension but is normotensive off of medications.  [name] [name] returns today for followup. He now reports feeling better since he had change in his medications. He is established with Dr. Buddy Duty who adjusted his diabetes medicines and stopped his Invokana. His shortness of breath and fatigue in both improved. He is now been expected some problems with left heel pain. He was found to have some spinal stenosis but has had improvement with inserts in his shoes.  I saw [name] [name] back in the office today. He has successfully underwent back surgery earlier this year which she says initially was much improved, however he says he subsequently developed more pain going down his legs. This sounds like it's  neuropathic pain, but is reluctant to try medication for it. He also significantly fatigue. This could be due to a number of etiologies. 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 Also, he is noted to have a history of obstructive sleep apnea. He was placed on BiPAP, but has not been compliant with that due to difficulty wearing the mask. He also  never had follow-up of his sleep study.  [name] [name] returns today for follow-up. He reports he's had worsening dyspnea and fatigue. He says that he can hardly sleep at night. He is struggling with significant anxiety. 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 He's not been wearing the BiPAP due to significant anxiety and difficulty sleeping. He feels like he gets smothered with his machine. Communication between Dr. Radford Pax and Dr. Quintin Alto led to the starting of Celexa as well as Klonopin to use as needed at night. He says that it does give him about 6 hours of sleep. He has not been using his BiPAP as instructed. He is reportedly had more worsening shortness of breath. He was seen in the ER for this and it was thought this was due [email or web address] with BiPAP. His BNP however is elevated over 300. Today in the office he says that he's had more swelling and more abdominal fullness as well as leg edema.  I had the pleasure seeing [name] [name] back today in the office. He seems to respond nicely to Lasix. I started him on 40 mg daily and while I was out of town my partner reviewed his lab work which included an elevated BNP over 300. He increased his Lasix to 40 mg twice a day. [name] [name] says he's had a significant improvement in his breathing. The BNP now is in the mid 200s. Renal function is stable based on labs today. Unfortunately, his echocardiogram does show a new cardiomyopathy with EF 30-35%. It should be noted that he felt "awful" on blood pressure/heart failure medicines and had taken himself off of ACE inhibitor and beta blocker in the past. The echo however does suggest inferior and anterolateral wall motion abnormalities which are new and could indicate graft dysfunction. I performed his last heart catheterization in 2015 which showed all 3 grafts patent.  [name] [name] returns today for follow-up of his left heart catheterization. I perform  this a few weeks ago and he had patent bypass grafts however LV function is reduced. Cardiac output is also reduced. Filling pressures were mildly elevated. He reports since discharge that he's had some improvement of his symptoms. He was started on low-dose beta blocker but he does report fatigue. When I asked him how they compared to previously he said he thinks attack sleep better since he started on the beta blocker but not back to what he thinks his baseline should be. He continues to have problems with left leg pain. He had ultrasound of the left leg which were unrevealing. He also had nerve conduction studies which I sent him for which were not suggestive of neuropathy. His symptoms were worse while lying flat on the Cath Lab table and I suspect this could be again coming from his back and he may need repeat orthopedic evaluation. His primary care provider started him on Celexa recently as well for anxiety.  12/23/2015  [name] [name] was seen back in the office  today in follow-up. Overall he seems to be doing well. He's tolerating low-dose carvedilol and has had some very infrequent morning dizziness. He was previously on Lasix 40 mg twice a day but ran out of the medicine about 8 days ago. He's not had any worsening weight gain or swelling nor worsening shortness of breath over the past week. This could be that he's had some improvement in his cardiomyopathy. Nevertheless, I would like him to be on some Lasix at least until we can determine whether or not he is euvolemic with lab work.  PMHx:  Past Medical History  Diagnosis Date  . Diabetes mellitus   . Coronary artery disease   . High cholesterol   . Ischemic cardiomyopathy   . CAD (coronary artery disease) 07/06/2011  . LV dysfunction 07/06/2011  . Stroke [facility])     [place]   . CHF (congestive heart failure) ([place])   . Arthritis     Knee L - probably  . S/P CABG (coronary artery bypass graft) 08/03/2011    x3; LIMA to LAD,, SVG to PDA, SVG to  posterolateral branch of RCA; Dr. Ellison Hughs  . BPH (benign prostatic hyperplasia)   . History of nuclear stress test 09/2010    dipyridamole; mild perfusion defect due to attenuation with mild superimposed ischemia in apical septal, apical, apical inferior & apical lateral regions; rest LV enlarged in size; prominent gut uptake in infero-apical region; no significant ischemia demonstrated; low risk scan   . Right bundle branch block   . Spinal stenosis of lumbar region   . HNP (herniated nucleus pulposus), lumbar   . Nocturia   . Frequency of urination   . Sleep apnea     sleep study 10/2010- AHI during total sleep 32.1/hr and during REM 62.3/hr (severe sleep apnea)unable to tolerate c pap    Past Surgical History  Procedure Laterality Date  . Cardiac catheterization  01/2011    ischemic cardiomyopathy 30-35%, multivessel CAD (Dr. [name])   . Pilonidal cyst removed    . Colonscopy    . Transthoracic echocardiogram  11/10/2012    EF 40-45%, mild LVH, mild hypokinesis of anteroseptal myocardium, grade 1 diastolic dysfunction; mild MR & calcifed mitral annulus; LA mild-mod dilated; RA mildly dilated  . Lower extremity arterial doppler  03/16/2013    bilat ABIs demonstated normal values; R runoff - posterior tibial & anterior tibial arteries occluded; L runoff - peroneal & posterior tibial arteries occluded, anterior tibial artery appears occluded  . Left heart catheterization with coronary/graft angiogram N/A 09/20/2013    Procedure: LEFT HEART CATHETERIZATION WITH [name];  Surgeon: [name], MD;  Location: [facility] CATH LAB;  Service: Cardiovascular;  Laterality: N/A;  . Coronary artery bypass graft  08/03/2011    Procedure: CORONARY ARTERY BYPASS GRAFTING (CABG);  Surgeon: [name], MD;  Location: [place];  Service: Open Heart Surgery;  Laterality: N/A;  CABG times three using right saphenous vein and left mammary artery usisng endoscope.  . Lumbar  laminectomy/decompression microdiscectomy N/A 09/12/2014    Procedure: LUMBAR DECOMPRESSION L3-L4, L4-L5, MICRODISCECTOMY L4-L5;  Surgeon: [name], MD;  Location: [facility] ORS;  Service: Orthopedics;  Laterality: N/A;  . Cardiac catheterization  09/13/2015    Procedure: Right/Left Heart Cath and Coronary/Graft Angiography;  Surgeon: [name], MD;  Location: [place] CV LAB;  Service: Cardiovascular;;    FAMHx:  Family History  Problem Relation Age of Onset  . Anesthesia problems Daughter   . Cancer  Mother   . Lung disease Father     & heart disease  . Colon cancer Sister   . Sudden death Maternal Grandmother     SOCHx:   reports that he quit smoking about 4 years ago. His smoking use included Cigars. He quit smokeless tobacco use about 3 years ago. He reports that he drinks alcohol. He reports that he does not use illicit drugs.  ALLERGIES:  No Known Allergies  ROS: Pertinent items noted in HPI and remainder of comprehensive ROS otherwise negative.  HOME MEDS: Current Outpatient Prescriptions  Medication Sig Dispense Refill  . aspirin EC 81 MG tablet Take 81 mg by mouth daily.    . carvedilol (COREG) 6.25 MG tablet Take 1 tablet (6.25 mg total) by mouth 2 (two) times daily. 60 tablet 5  . citalopram (CELEXA) 20 MG tablet Take 1 tablet by mouth daily.    . clonazePAM (KLONOPIN) 0.5 MG tablet Take 1 tablet by mouth daily.    . furosemide (LASIX) 40 MG tablet Take 1 tablet (40 mg total) by mouth 2 (two) times daily. 30 tablet 3  . gabapentin (NEURONTIN) 300 MG capsule Take 300 mg by mouth at bedtime.    Marland Kitchen glipiZIDE (GLUCOTROL XL) 10 MG 24 hr tablet Take 10 mg by mouth every morning.     . metFORMIN (GLUCOPHAGE-XR) 500 MG 24 hr tablet Take 1,000 mg by mouth 2 (two) times daily.    . polycarbophil (FIBERCON) 625 MG tablet Take 625 mg by mouth as needed for mild constipation or moderate constipation.     . rosuvastatin (CRESTOR) 20 MG tablet Take 10 mg by mouth every evening.      Marland Kitchen spironolactone (ALDACTONE) 25 MG tablet TAKE ONE-HALF TABLET BY MOUTH ONCE DAILY 15 tablet 2  . tamsulosin (FLOMAX) 0.4 MG CAPS capsule Take 1 capsule by mouth every evening.     . TRADJENTA 5 MG TABS tablet Take 5 mg by mouth daily.     No current facility-administered medications for this visit.    LABS/IMAGING: No results found for this or any previous visit (from the past 48 hour(s)). No results found.  VITALS: BP 110/50 mmHg  Pulse 61  Ht 6\' 2"  (1.88 m)  Wt 245 lb 9.6 oz (111.403 kg)  BMI 31.52 kg/m2  SpO2 97%  EXAM: General appearance: alert and no distress Neck: no carotid bruit and no JVD Lungs: clear to auscultation bilaterally Heart: regular rate and rhythm, S1, S2 normal, no murmur, click, rub or gallop Abdomen: soft, non-tender; bowel sounds normal; no masses,  no organomegaly Extremities: extremities normal, atraumatic, no cyanosis or edema Pulses: 2+ and symmetric 2+ bilateral DP, 2+ L PT, 1+ R PT Skin: Skin color, texture, turgor normal. No rashes or lesions Neurologic: Grossly normal Psych: Pleasant  EKG: Sinus rhythm with first-degree AV block and inferior T-wave changes 61  ASSESSMENT: 1. Acute on chronic systolic congestive heart failure - EF 30-35% 2. Coronary artery disease status post three-vessel CABG in 2013 (LIMA to LAD, SVG to PDA and SVG to PLA) - patent grafts by cath in 08/2015 3. Hypertension-controlled 4. Dyslipidemia 5. Diabetes type 2 - better controlled 6. Mild PVD-with normal ABI 7. Hypotension/upper back pain - resolved 8. Spinal stenosis - s/p surgery 9. Trifascicular block 10. OSA-back on BiPAP  PLAN: 1.   Mr. Men seems to be euvolemic off of diuretics for 1 week. We'll go ahead and check a BNP and BMP and likely restart daily lasix 40 mg for  now. If his BNP is low, then we may be able to dose the lasix PRN. There is no room to add ACE-I or ARB or uptitrate his b-blocker at this time. We'll repeat a limited echo for LV  function in 3 months and see him back to review the results at that time.  [name], MD, [facility] Attending Cardiologist [place] 12/24/2015, 8:10 AM

## 2015-12-25 LAB — BASIC METABOLIC PANEL
BUN: 17 mg/dL (ref 7–25)
CALCIUM: 9.2 mg/dL (ref 8.6–10.3)
CHLORIDE: 103 mmol/L (ref 98–110)
CO2: 24 mmol/L (ref 20–31)
CREATININE: 1.04 mg/dL (ref 0.70–1.18)
Glucose, Bld: 201 mg/dL — ABNORMAL HIGH (ref 65–99)
Potassium: 5.2 mmol/L (ref 3.5–5.3)
SODIUM: 139 mmol/L (ref 135–146)

## 2015-12-25 LAB — BRAIN NATRIURETIC PEPTIDE: Brain Natriuretic Peptide: 173.8 pg/mL — ABNORMAL HIGH (ref <100)

## 2016-01-09 DIAGNOSIS — M25672 Stiffness of left ankle, not elsewhere classified: Secondary | ICD-10-CM | POA: Diagnosis not present

## 2016-01-09 DIAGNOSIS — M9906 Segmental and somatic dysfunction of lower extremity: Secondary | ICD-10-CM | POA: Diagnosis not present

## 2016-01-09 DIAGNOSIS — R201 Hypoesthesia of skin: Secondary | ICD-10-CM | POA: Diagnosis not present

## 2016-01-09 DIAGNOSIS — M21372 Foot drop, left foot: Secondary | ICD-10-CM | POA: Diagnosis not present

## 2016-01-09 DIAGNOSIS — G5732 Lesion of lateral popliteal nerve, left lower limb: Secondary | ICD-10-CM | POA: Diagnosis not present

## 2016-01-09 DIAGNOSIS — M791 Myalgia: Secondary | ICD-10-CM | POA: Diagnosis not present

## 2016-01-14 DIAGNOSIS — H538 Other visual disturbances: Secondary | ICD-10-CM | POA: Diagnosis not present

## 2016-01-14 DIAGNOSIS — H2513 Age-related nuclear cataract, bilateral: Secondary | ICD-10-CM | POA: Diagnosis not present

## 2016-01-20 ENCOUNTER — Other Ambulatory Visit: Payer: Self-pay | Admitting: Internal Medicine

## 2016-02-06 ENCOUNTER — Encounter: Payer: Self-pay | Admitting: Cardiology

## 2016-02-16 DIAGNOSIS — E669 Obesity, unspecified: Secondary | ICD-10-CM | POA: Insufficient documentation

## 2016-02-16 NOTE — Progress Notes (Signed)
Cardiology Office Note    Date:  02/20/2016   ID:  [name] [name], DOB [birth year], MRN [id number]  PCP:  [name] [name], MD  Cardiologist:  Fransico Him, MD   Chief Complaint  Patient presents with  . Sleep Apnea  . Hypertension    History of Present Illness:  [name] [name] is a 70 y.o. male with a history of CAD, DM and CHF who also has OSA and has been on BiPAP but has not been very compliant.  He was referred by Dr. Debara [place] due to complaints of significant fatigue and has had a hard time sleeping at night.  His sleep study showed severe OSA with an AHI of 60/hr with a mild component of central sleep apnea and oxygen desaturations as low as 82%.  He underwent unsuccessful CPAP titration due to continued respiratory events and underwent BIPAP titration to 13/9cm H2O.  Apparently he has not been compliant with his device.  He says that he feels like he gets smothered with his machine and then gets very anxious about going to sleep.  He was placed on Celexa and Klonopin as needed at night which has helped some.  He recently was seen in the ER with SOB that was felt to be due to not being compliant with his BIPAP.  He took the Klonopin and Celexa for a while and got used to BiPAP and is now doing better.  He goes to bed at 9:30-10pm and falls asleep within few minutes.  He gets up once a night and wakes up around 6:30am.    Since going on BiPAP he feels more rested in the am and has no daytime sleepiness.  He tolerates his full face mask well without any problems. He feels the pressure is adequate.  He does not have much mouth dryness.  He denies any nasal congestion.     Past Medical History:  Diagnosis Date  . Arthritis    Knee L - probably  . BPH (benign prostatic hyperplasia)   . CAD (coronary artery disease) 07/06/2011  . CHF (congestive heart failure) ([place])   . Coronary artery disease   . Diabetes mellitus   . Frequency of urination   . High cholesterol   . History of nuclear stress  test 09/2010   dipyridamole; mild perfusion defect due to attenuation with mild superimposed ischemia in apical septal, apical, apical inferior & apical lateral regions; rest LV enlarged in size; prominent gut uptake in infero-apical region; no significant ischemia demonstrated; low risk scan   . HNP (herniated nucleus pulposus), lumbar   . Ischemic cardiomyopathy   . LV dysfunction 07/06/2011  . Nocturia   . Right bundle branch block   . 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 Ellison Hughs  . Sleep apnea    sleep study 10/2010- AHI during total sleep 32.1/hr and during REM 62.3/hr (severe sleep apnea)unable to tolerate c pap  . Spinal stenosis of lumbar region   . Stroke [facility])    [place]     Past Surgical History:  Procedure Laterality Date  . CARDIAC CATHETERIZATION  01/2011   ischemic cardiomyopathy 30-35%, multivessel CAD (Dr. [name])   . CARDIAC CATHETERIZATION  09/13/2015   Procedure: Right/Left Heart Cath and Coronary/Graft Angiography;  Surgeon: [name], MD;  Location: [place] CV LAB;  Service: Cardiovascular;;  . Colonscopy    . CORONARY ARTERY  BYPASS GRAFT  08/03/2011   Procedure: CORONARY ARTERY BYPASS GRAFTING (CABG);  Surgeon: [name], MD;  Location: [place];  Service: Open Heart Surgery;  Laterality: N/A;  CABG times three using right saphenous vein and left mammary artery usisng endoscope.  Marland Kitchen LEFT HEART CATHETERIZATION WITH CORONARY/GRAFT ANGIOGRAM N/A 09/20/2013   Procedure: LEFT HEART CATHETERIZATION WITH [name];  Surgeon: [name], MD;  Location: [facility] CATH LAB;  Service: Cardiovascular;  Laterality: N/A;  . Lower Extremity Arterial Doppler  03/16/2013   bilat ABIs demonstated normal values; R runoff - posterior tibial & anterior tibial arteries occluded; L runoff - peroneal & posterior tibial arteries occluded, anterior tibial artery appears occluded  .  LUMBAR LAMINECTOMY/DECOMPRESSION MICRODISCECTOMY N/A 09/12/2014   Procedure: LUMBAR DECOMPRESSION L3-L4, L4-L5, MICRODISCECTOMY L4-L5;  Surgeon: [name], MD;  Location: [facility] ORS;  Service: Orthopedics;  Laterality: N/A;  . Pilonidal Cyst removed    . TRANSTHORACIC ECHOCARDIOGRAM  11/10/2012   EF 40-45%, mild LVH, mild hypokinesis of anteroseptal myocardium, grade 1 diastolic dysfunction; mild MR & calcifed mitral annulus; LA mild-mod dilated; RA mildly dilated    Current Medications: Outpatient Medications Prior to Visit  Medication Sig Dispense Refill  . aspirin EC 81 MG tablet Take 81 mg by mouth daily.    . carvedilol (COREG) 6.25 MG tablet Take 1 tablet (6.25 mg total) by mouth 2 (two) times daily. 60 tablet 5  . furosemide (LASIX) 40 MG tablet Take 1 tablet (40 mg total) by mouth daily. 30 tablet 9  . gabapentin (NEURONTIN) 300 MG capsule Take 300 mg by mouth at bedtime.    Marland Kitchen glipiZIDE (GLUCOTROL XL) 10 MG 24 hr tablet Take 10 mg by mouth every morning.     . metFORMIN (GLUCOPHAGE-XR) 500 MG 24 hr tablet Take 1,000 mg by mouth 2 (two) times daily.    . polycarbophil (FIBERCON) 625 MG tablet Take 625 mg by mouth as needed for mild constipation or moderate constipation.     . rosuvastatin (CRESTOR) 20 MG tablet Take 10 mg by mouth every evening.     Marland Kitchen spironolactone (ALDACTONE) 25 MG tablet TAKE ONE-HALF TABLET BY MOUTH ONCE DAILY 15 tablet 2  . tamsulosin (FLOMAX) 0.4 MG CAPS capsule Take 1 capsule by mouth every evening.     . TRADJENTA 5 MG TABS tablet Take 5 mg by mouth daily.    . citalopram (CELEXA) 20 MG tablet Take 1 tablet by mouth daily.    . clonazePAM (KLONOPIN) 0.5 MG tablet Take 1 tablet by mouth daily.    Marland Kitchen spironolactone (ALDACTONE) 25 MG tablet TAKE ONE-HALF TABLET BY MOUTH ONCE DAILY 15 tablet 0   No facility-administered medications prior to visit.      Allergies:   Review of patient's allergies indicates no known allergies.   Social History   Social History    . Marital status: Widowed    Spouse name: N/A  . Number of children: 1  . Years of education: N/A   Occupational History  . real Investment banker, corporate    Social History Main Topics  . Smoking status: Former Smoker    Years: 5.00    Types: Cigars    Quit date: 09/07/2011  . Smokeless tobacco: Former Systems developer    Quit date: 02/28/2012  . Alcohol use 0.0 oz/week     Comment: occasional  . Drug use: No  . Sexual activity: Not Asked   Other Topics Concern  . None   Social History Narrative  .  None     Family History:  The patient's family history includes Anesthesia problems in his daughter; Cancer in his mother; Colon cancer in his sister; Lung disease in his father; Sudden death in his maternal grandmother.   ROS:   Please see the history of present illness.    ROS All other systems reviewed and are negative.   PHYSICAL EXAM:   VS:  BP (!) 120/56   Pulse 76   Ht 6\' 2"  (1.88 m)   Wt 248 lb (112.5 kg)   BMI 31.84 kg/m    GEN: Well nourished, well developed, in no acute distress  HEENT: normal  Neck: no JVD, carotid bruits, or masses Cardiac: RRR; no murmurs, rubs, or gallops,no edema.  Intact distal pulses bilaterally.  Respiratory:  clear to auscultation bilaterally, normal work of breathing GI: soft, nontender, nondistended, + BS MS: no deformity or atrophy  Skin: warm and dry, no rash Neuro:  Alert and Oriented x 3, Strength and sensation are intact Psych: euthymic mood, full affect  Wt Readings from Last 3 Encounters:  02/20/16 248 lb (112.5 kg)  12/24/15 245 lb 9.6 oz (111.4 kg)  09/26/15 240 lb 2 oz (108.9 kg)      Studies/Labs Reviewed:   EKG:  EKG is not ordered today.   Recent Labs: 09/05/2015: ALT 20 09/12/2015: Hemoglobin 13.7; Platelets 243; TSH 1.66 12/24/2015: Brain Natriuretic Peptide 173.8; BUN 17; Creat 1.04; Potassium 5.2; Sodium 139   Lipid Panel No results found for: CHOL, TRIG, HDL, CHOLHDL, VLDL, LDLCALC, LDLDIRECT  Additional studies/ records  that were reviewed today include:  CPAP titration    ASSESSMENT:    1. OSA (obstructive sleep apnea)   2. Essential hypertension   3. Obesity (BMI 30-39.9)      PLAN:  In order of problems listed above:  OSA - the patient is tolerating PAP therapy well without any problems.   The patient has been using and benefiting from CPAP use and will continue to benefit from therapy. I will get a download from the DME. HTN - BP controlled on current meds.  Continue BB/diuretic Obesity - I have encouraged him to get into a routine exercise program and cut back on carbs and portions.  His exercise is limited by leg pain but is going to see a therapist tomorrow.      Medication Adjustments/Labs and Tests Ordered: Current medicines are reviewed at length with the patient today.  Concerns regarding medicines are outlined above.  Medication changes, Labs and Tests ordered today are listed in the Patient Instructions below.  There are no Patient Instructions on file for this visit.   Signed, Fransico Him, MD  02/20/2016 8:54 AM    [address], [place], [place]  [address] Phone: [phone]; Fax: [phone]

## 2016-02-18 ENCOUNTER — Other Ambulatory Visit: Payer: Self-pay | Admitting: Internal Medicine

## 2016-02-18 DIAGNOSIS — M21372 Foot drop, left foot: Secondary | ICD-10-CM | POA: Diagnosis not present

## 2016-02-18 DIAGNOSIS — M9906 Segmental and somatic dysfunction of lower extremity: Secondary | ICD-10-CM | POA: Diagnosis not present

## 2016-02-18 DIAGNOSIS — R201 Hypoesthesia of skin: Secondary | ICD-10-CM | POA: Diagnosis not present

## 2016-02-18 DIAGNOSIS — M791 Myalgia: Secondary | ICD-10-CM | POA: Diagnosis not present

## 2016-02-18 DIAGNOSIS — G5732 Lesion of lateral popliteal nerve, left lower limb: Secondary | ICD-10-CM | POA: Diagnosis not present

## 2016-02-18 DIAGNOSIS — M25672 Stiffness of left ankle, not elsewhere classified: Secondary | ICD-10-CM | POA: Diagnosis not present

## 2016-02-20 ENCOUNTER — Ambulatory Visit (INDEPENDENT_AMBULATORY_CARE_PROVIDER_SITE_OTHER): Payer: Medicare Other | Admitting: Cardiology

## 2016-02-20 ENCOUNTER — Encounter: Payer: Self-pay | Admitting: Cardiology

## 2016-02-20 VITALS — BP 120/56 | HR 76 | Ht 74.0 in | Wt 248.0 lb

## 2016-02-20 DIAGNOSIS — I255 Ischemic cardiomyopathy: Secondary | ICD-10-CM | POA: Diagnosis not present

## 2016-02-20 DIAGNOSIS — G4733 Obstructive sleep apnea (adult) (pediatric): Secondary | ICD-10-CM | POA: Diagnosis not present

## 2016-02-20 DIAGNOSIS — I10 Essential (primary) hypertension: Secondary | ICD-10-CM | POA: Diagnosis not present

## 2016-02-20 DIAGNOSIS — E669 Obesity, unspecified: Secondary | ICD-10-CM | POA: Diagnosis not present

## 2016-02-20 NOTE — Patient Instructions (Signed)

## 2016-02-21 DIAGNOSIS — R2689 Other abnormalities of gait and mobility: Secondary | ICD-10-CM | POA: Diagnosis not present

## 2016-02-21 DIAGNOSIS — R531 Weakness: Secondary | ICD-10-CM | POA: Diagnosis not present

## 2016-02-21 DIAGNOSIS — M79605 Pain in left leg: Secondary | ICD-10-CM | POA: Diagnosis not present

## 2016-02-24 DIAGNOSIS — M79605 Pain in left leg: Secondary | ICD-10-CM | POA: Diagnosis not present

## 2016-02-24 DIAGNOSIS — R2689 Other abnormalities of gait and mobility: Secondary | ICD-10-CM | POA: Diagnosis not present

## 2016-02-24 DIAGNOSIS — R531 Weakness: Secondary | ICD-10-CM | POA: Diagnosis not present

## 2016-02-26 DIAGNOSIS — R2689 Other abnormalities of gait and mobility: Secondary | ICD-10-CM | POA: Diagnosis not present

## 2016-02-26 DIAGNOSIS — M79605 Pain in left leg: Secondary | ICD-10-CM | POA: Diagnosis not present

## 2016-02-26 DIAGNOSIS — R531 Weakness: Secondary | ICD-10-CM | POA: Diagnosis not present

## 2016-03-11 ENCOUNTER — Other Ambulatory Visit: Payer: Self-pay | Admitting: Internal Medicine

## 2016-03-11 DIAGNOSIS — M79605 Pain in left leg: Secondary | ICD-10-CM | POA: Diagnosis not present

## 2016-03-11 DIAGNOSIS — E1165 Type 2 diabetes mellitus with hyperglycemia: Secondary | ICD-10-CM | POA: Diagnosis not present

## 2016-03-11 DIAGNOSIS — I10 Essential (primary) hypertension: Secondary | ICD-10-CM | POA: Diagnosis not present

## 2016-03-11 DIAGNOSIS — E782 Mixed hyperlipidemia: Secondary | ICD-10-CM | POA: Diagnosis not present

## 2016-03-11 DIAGNOSIS — R531 Weakness: Secondary | ICD-10-CM | POA: Diagnosis not present

## 2016-03-11 DIAGNOSIS — R2689 Other abnormalities of gait and mobility: Secondary | ICD-10-CM | POA: Diagnosis not present

## 2016-03-15 IMAGING — DX DG SPINE 1V PORT
1 series · 1 of 1 positions shown · non-contrast
Comparison: 09/12/2014.  09/07/2014.

CLINICAL DATA: Intraoperative imaging

EXAM:
PORTABLE SPINE - 1 VIEW

[l-spine x-table]
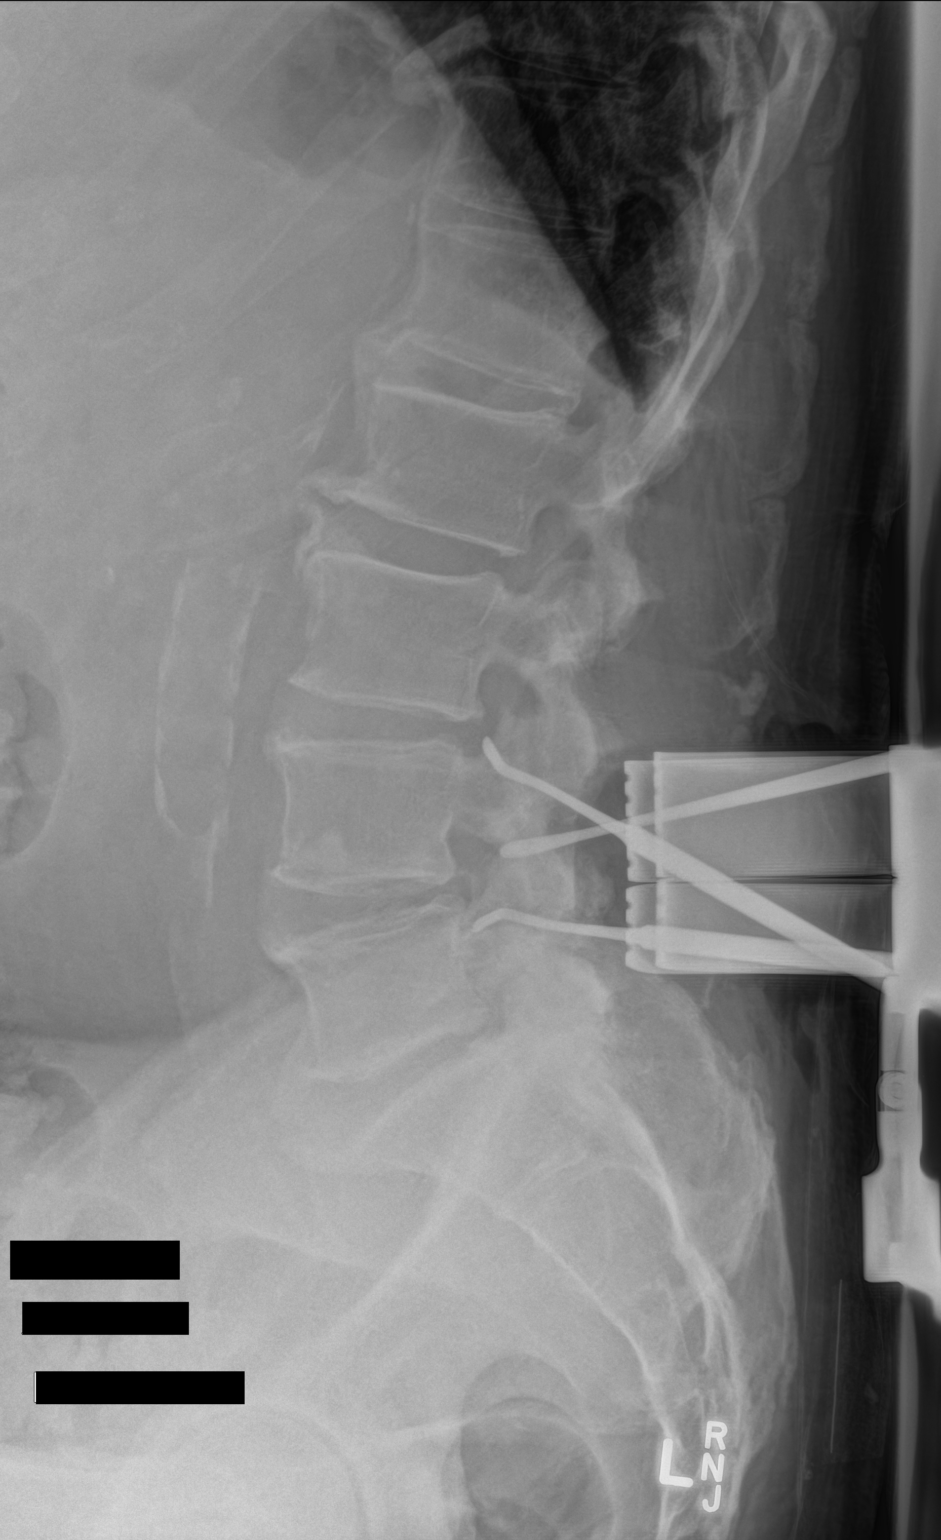

[1 of 1 positions shown; findings below may reference images not displayed]

FINDINGS: Posterior surgical instruments extend from L3-4 to the pedicles at
L5. Normal alignment.
IMPRESSION: Intraoperative localization from L3-4 to the L5 pedicles.

## 2016-03-15 IMAGING — DX DG SPINE 1V PORT
1 series · 1 of 1 positions shown · non-contrast
Comparison: 09/12/2014.  09/07/2014.

CLINICAL DATA: Surgical level L4-5.

EXAM:
PORTABLE SPINE - 1 VIEW

[l-spine x-table]
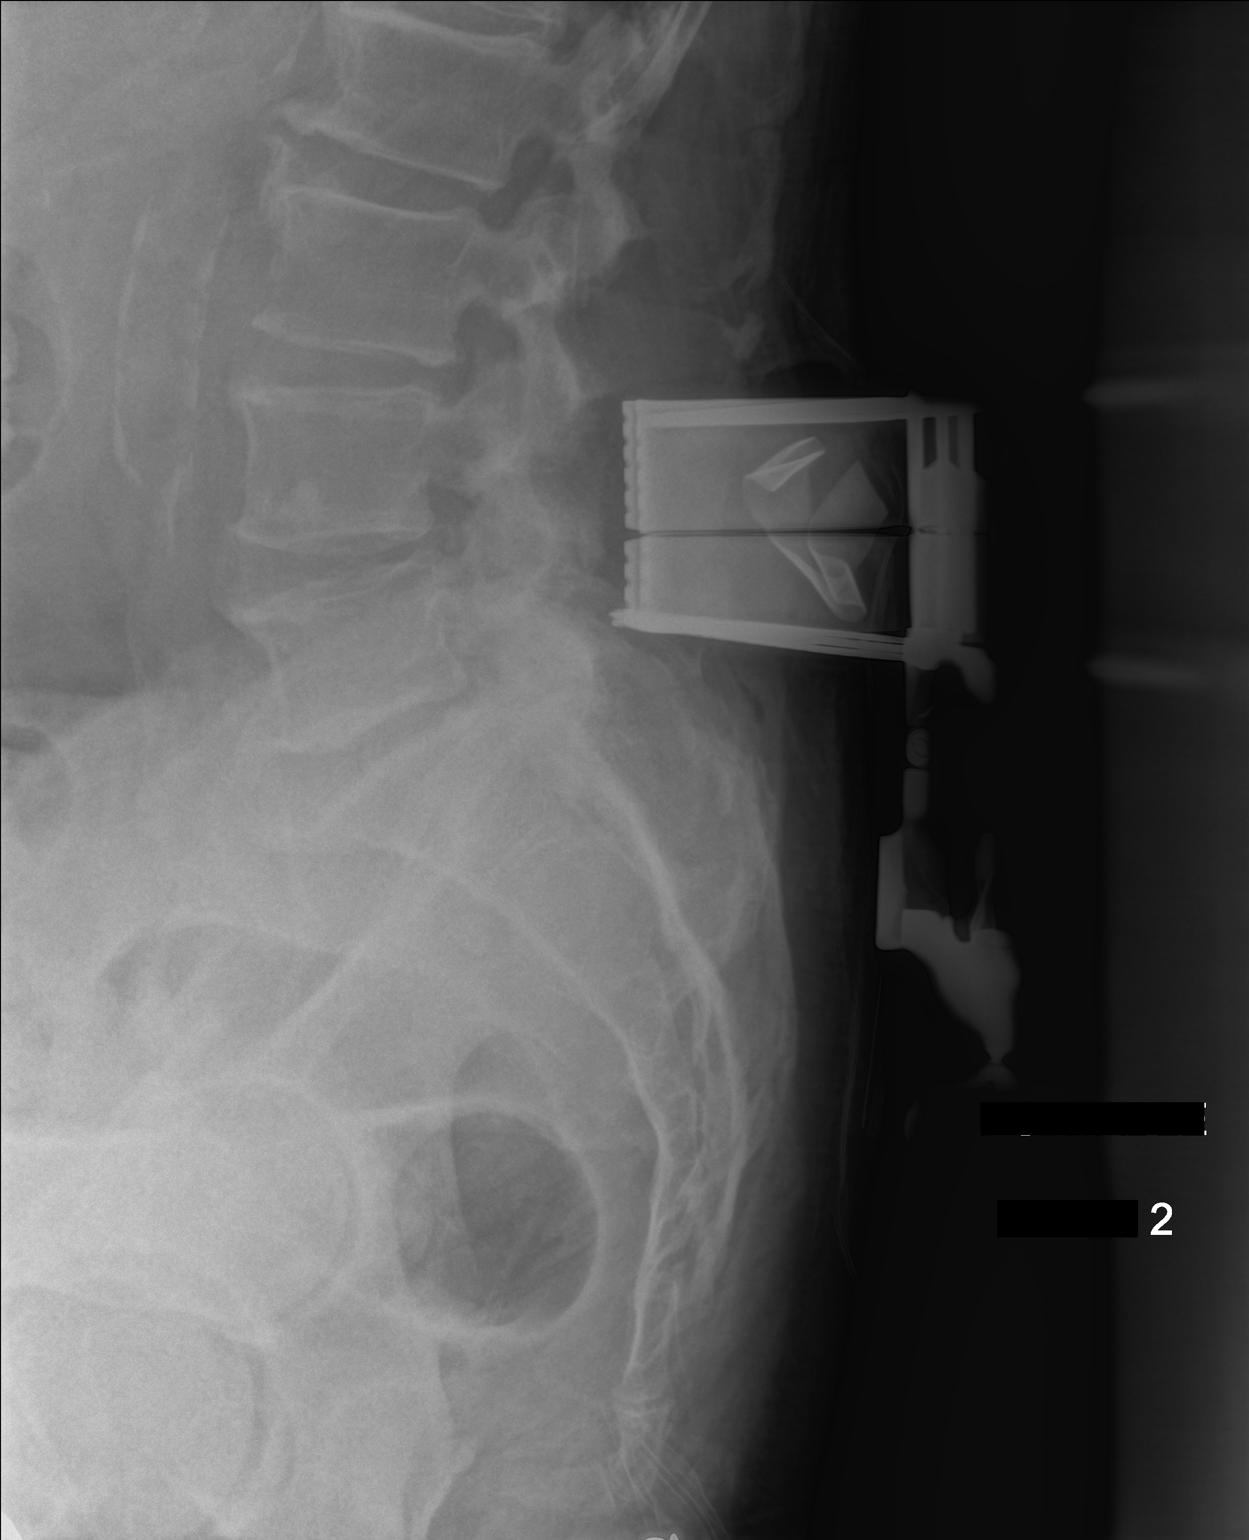

[1 of 1 positions shown; findings below may reference images not displayed]

FINDINGS: Posterior surgical instruments are noted, overlying the L4 and L5
levels.
IMPRESSION: Intraoperative localization of L4 and L5.

## 2016-03-16 DIAGNOSIS — R531 Weakness: Secondary | ICD-10-CM | POA: Diagnosis not present

## 2016-03-16 DIAGNOSIS — M79605 Pain in left leg: Secondary | ICD-10-CM | POA: Diagnosis not present

## 2016-03-16 DIAGNOSIS — R2689 Other abnormalities of gait and mobility: Secondary | ICD-10-CM | POA: Diagnosis not present

## 2016-03-18 DIAGNOSIS — Z6831 Body mass index (BMI) 31.0-31.9, adult: Secondary | ICD-10-CM | POA: Diagnosis not present

## 2016-03-18 DIAGNOSIS — F411 Generalized anxiety disorder: Secondary | ICD-10-CM | POA: Diagnosis not present

## 2016-03-18 DIAGNOSIS — G4733 Obstructive sleep apnea (adult) (pediatric): Secondary | ICD-10-CM | POA: Diagnosis not present

## 2016-03-18 DIAGNOSIS — Z23 Encounter for immunization: Secondary | ICD-10-CM | POA: Diagnosis not present

## 2016-03-18 DIAGNOSIS — I5022 Chronic systolic (congestive) heart failure: Secondary | ICD-10-CM | POA: Diagnosis not present

## 2016-03-18 DIAGNOSIS — I255 Ischemic cardiomyopathy: Secondary | ICD-10-CM | POA: Diagnosis not present

## 2016-03-18 DIAGNOSIS — E1165 Type 2 diabetes mellitus with hyperglycemia: Secondary | ICD-10-CM | POA: Diagnosis not present

## 2016-03-18 DIAGNOSIS — I5033 Acute on chronic diastolic (congestive) heart failure: Secondary | ICD-10-CM | POA: Diagnosis not present

## 2016-03-19 DIAGNOSIS — R531 Weakness: Secondary | ICD-10-CM | POA: Diagnosis not present

## 2016-03-19 DIAGNOSIS — M79605 Pain in left leg: Secondary | ICD-10-CM | POA: Diagnosis not present

## 2016-03-19 DIAGNOSIS — R2689 Other abnormalities of gait and mobility: Secondary | ICD-10-CM | POA: Diagnosis not present

## 2016-03-25 ENCOUNTER — Other Ambulatory Visit (HOSPITAL_COMMUNITY): Payer: Medicare Other

## 2016-04-01 ENCOUNTER — Other Ambulatory Visit: Payer: Self-pay

## 2016-04-01 ENCOUNTER — Other Ambulatory Visit: Payer: Self-pay | Admitting: Internal Medicine

## 2016-04-01 ENCOUNTER — Ambulatory Visit (HOSPITAL_COMMUNITY): Payer: Medicare Other | Attending: Cardiovascular Disease

## 2016-04-01 DIAGNOSIS — I5030 Unspecified diastolic (congestive) heart failure: Secondary | ICD-10-CM | POA: Insufficient documentation

## 2016-04-01 DIAGNOSIS — I517 Cardiomegaly: Secondary | ICD-10-CM | POA: Insufficient documentation

## 2016-04-01 DIAGNOSIS — I428 Other cardiomyopathies: Secondary | ICD-10-CM

## 2016-04-01 DIAGNOSIS — I519 Heart disease, unspecified: Secondary | ICD-10-CM

## 2016-04-01 DIAGNOSIS — I501 Left ventricular failure: Secondary | ICD-10-CM | POA: Diagnosis not present

## 2016-04-01 LAB — ECHOCARDIOGRAM LIMITED
CHL CUP TV REG PEAK VELOCITY: 247 cm/s
E decel time: 289 msec
EERAT: 7.17
FS: 16 % — AB (ref 28–44)
IVS/LV PW RATIO, ED: 1.17
LA ID, A-P, ES: 46 mm
LADIAMINDEX: 1.93 cm/m2
LDCA: 5.31 cm2
LEFT ATRIUM END SYS DIAM: 46 mm
LV E/e' medial: 7.17
LV E/e'average: 7.17
LV SIMPSON'S DISK: 33
LV sys vol index: 32 mL/m2
LV sys vol: 77 mL — AB (ref 21–61)
LVDIAVOL: 115 mL (ref 62–150)
LVDIAVOLIN: 48 mL/m2
LVELAT: 11.7 cm/s
LVOT SV: 70 mL
LVOT VTI: 13.1 cm
LVOT diameter: 26 mm
LVOT peak grad rest: 2 mmHg
LVOTPV: 61.7 cm/s
MV Dec: 289
MV pk A vel: 89.8 m/s
MV pk E vel: 83.9 m/s
MVPG: 3 mmHg
PW: 9.86 mm — AB (ref 0.6–1.1)
Stroke v: 38 ml
TDI e' lateral: 11.7
TDI e' medial: 3.02
TR max vel: 247 cm/s

## 2016-04-01 MED ORDER — PERFLUTREN LIPID MICROSPHERE
1.0000 mL | INTRAVENOUS | Status: AC | PRN
  Administered 2016-04-01 (×2): 2 mL via INTRAVENOUS

## 2016-04-06 DIAGNOSIS — M21372 Foot drop, left foot: Secondary | ICD-10-CM | POA: Diagnosis not present

## 2016-04-06 DIAGNOSIS — G5732 Lesion of lateral popliteal nerve, left lower limb: Secondary | ICD-10-CM | POA: Diagnosis not present

## 2016-04-06 DIAGNOSIS — M9906 Segmental and somatic dysfunction of lower extremity: Secondary | ICD-10-CM | POA: Diagnosis not present

## 2016-04-06 DIAGNOSIS — M791 Myalgia: Secondary | ICD-10-CM | POA: Diagnosis not present

## 2016-04-06 DIAGNOSIS — R201 Hypoesthesia of skin: Secondary | ICD-10-CM | POA: Diagnosis not present

## 2016-04-06 DIAGNOSIS — M25672 Stiffness of left ankle, not elsewhere classified: Secondary | ICD-10-CM | POA: Diagnosis not present

## 2016-04-09 ENCOUNTER — Telehealth: Payer: Self-pay | Admitting: Internal Medicine

## 2016-04-09 NOTE — Telephone Encounter (Signed)
Pt would like his echo results from last Wednesday please.

## 2016-04-09 NOTE — Telephone Encounter (Signed)
Spoke to patient.  ECHO Result given . Verbalized understanding. Follow up recall appointment scheduled for 05/27/16 at 8:15 am.

## 2016-05-26 ENCOUNTER — Other Ambulatory Visit: Payer: Self-pay | Admitting: Internal Medicine

## 2016-05-27 ENCOUNTER — Encounter: Payer: Self-pay | Admitting: Internal Medicine

## 2016-05-27 ENCOUNTER — Ambulatory Visit (INDEPENDENT_AMBULATORY_CARE_PROVIDER_SITE_OTHER): Payer: Medicare Other | Admitting: Internal Medicine

## 2016-05-27 VITALS — BP 148/73 | HR 66 | Ht 74.0 in | Wt 254.8 lb

## 2016-05-27 DIAGNOSIS — E669 Obesity, unspecified: Secondary | ICD-10-CM

## 2016-05-27 DIAGNOSIS — I10 Essential (primary) hypertension: Secondary | ICD-10-CM

## 2016-05-27 DIAGNOSIS — I2510 Atherosclerotic heart disease of native coronary artery without angina pectoris: Secondary | ICD-10-CM | POA: Diagnosis not present

## 2016-05-27 DIAGNOSIS — I255 Ischemic cardiomyopathy: Secondary | ICD-10-CM | POA: Diagnosis not present

## 2016-05-27 DIAGNOSIS — I5022 Chronic systolic (congestive) heart failure: Secondary | ICD-10-CM | POA: Insufficient documentation

## 2016-05-27 MED ORDER — FUROSEMIDE 20 MG PO TABS: 20.0000 mg | ORAL_TABLET | Freq: Every day | ORAL | 2 refills | Status: DC

## 2016-05-27 MED ORDER — SACUBITRIL-VALSARTAN 24-26 MG PO TABS: 1.0000 | ORAL_TABLET | Freq: Two times a day (BID) | ORAL | 3 refills | Status: DC

## 2016-05-27 NOTE — Progress Notes (Signed)
OFFICE NOTE  Chief Complaint:  Follow-up heart failure  Primary Care Physician: [name] [name], MD  HPI:  [name] [name] is a pleasant 70 year old gentleman previously followed by Dr. Rollene Fare. His past medical history is significant for coronary artery disease. In 2013 he underwent multivessel CABG for an ischemic cardiomyopathy. EF was 20-25% prior to surgery however post surgery his EF had improved up to 45-50%. Recently his EF was 40-45% by echo in May of 2014. There was mild mitral regurgitation, mild to moderately dilated left atrium and mild LVH. [name] [name] has been describing some anxiety. He also reports some pain in his legs however underwent Dopplers in September 2014 which showed preserved ABIs bilaterally a 1.0 on the right and 1.1 on the left. The bilateral peroneal and posterior tibial arteries were occluded. He reports some optimal control of his diabetes. His A1c was 7.2. He is concerned about the cost of taking Tradjenta. He is followed by Dr. Elyse Hsu.   He was recently seen in the emergency room and he can hospital. There he presented with hypotension and was given 1 L normal saline and his symptoms improved. He had been having dizziness as well as some upper back pain into the left shoulder blade with exertion recently. The symptoms are worse particularly when climbing up ladders or going up stairs and are relieved at rest. After that hospitalization it was recommended that he discontinue his ACE inhibitor and beta blocker, and his blood pressure appears improved today up to 110/60.  [name] returns today for followup of his stress test. This is interpreted as nonischemic with an EF of 44%. He reports still feeling very fatigued and extremely short of breath when doing activities. He says that the other day he tried to hit golf balls and felt that he was totally exhausted after just an hour. He reports his blood pressure has improved somewhat off of all medications  however remains low. He has had symptoms of orthostatic hypotension in the past. His symptoms do feel similar to prior to his bypass surgery. He also feels that he was much better after surgery than he is now and that his symptoms have been going on for the past 2 months.  At his last office visit, I recommended that [name] [name] have a repeat cardiac catheterization (08/2013). This demonstrated the following:  Impression:  1. 2 vessel native CAD with occluded LAD in the mid-vessel  2. Patent LIMA-LAD, SVG to PLA and SVG to PDA grafts with TIMI III flow  3. LVEDP = 13 mmHg  4. Random PVC's were noted  Based on these findings, I could not find any new cardiac cause of his symptoms. I suspect that some of his fatigue could be related to labile blood sugars. He says unexplained hypotension but is normotensive off of medications.  [name] [name] returns today for followup. He now reports feeling better since he had change in his medications. He is established with Dr. Buddy Duty who adjusted his diabetes medicines and stopped his Invokana. His shortness of breath and fatigue in both improved. He is now been expected some problems with left heel pain. He was found to have some spinal stenosis but has had improvement with inserts in his shoes.  I saw [name] [name] back in the office today. He has successfully underwent back surgery earlier this year which she says initially was much improved, however he says he subsequently developed more pain going down his legs. This sounds like  it's neuropathic pain, but is reluctant to try medication for it. He also significantly fatigue. This could be due to a number of etiologies. 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 Also, he is noted to have a history of obstructive sleep apnea. He was placed on BiPAP, but has not been compliant with that due to difficulty wearing the mask.  He also never had follow-up of his sleep study.  [name] [name] returns today for follow-up. He reports he's had worsening dyspnea and fatigue. He says that he can hardly sleep at night. He is struggling with significant anxiety. 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 He's not been wearing the BiPAP due to significant anxiety and difficulty sleeping. He feels like he gets smothered with his machine. Communication between Dr. Radford Pax and Dr. Quintin Alto led to the starting of Celexa as well as Klonopin to use as needed at night. He says that it does give him about 6 hours of sleep. He has not been using his BiPAP as instructed. He is reportedly had more worsening shortness of breath. He was seen in the ER for this and it was thought this was due [email or web address] with BiPAP. His BNP however is elevated over 300. Today in the office he says that he's had more swelling and more abdominal fullness as well as leg edema.  I had the pleasure seeing [name] [name] back today in the office. He seems to respond nicely to Lasix. I started him on 40 mg daily and while I was out of town my partner reviewed his lab work which included an elevated BNP over 300. He increased his Lasix to 40 mg twice a day. [name] [name] says he's had a significant improvement in his breathing. The BNP now is in the mid 200s. Renal function is stable based on labs today. Unfortunately, his echocardiogram does show a new cardiomyopathy with EF 30-35%. It should be noted that he felt "awful" on blood pressure/heart failure medicines and had taken himself off of ACE inhibitor and beta blocker in the past. The echo however does suggest inferior and anterolateral wall motion abnormalities which are new and could indicate graft dysfunction. I performed his last heart catheterization in 2015 which showed all 3 grafts patent.  [name] [name] returns today for follow-up of his left heart catheterization. I  perform this a few weeks ago and he had patent bypass grafts however LV function is reduced. Cardiac output is also reduced. Filling pressures were mildly elevated. He reports since discharge that he's had some improvement of his symptoms. He was started on low-dose beta blocker but he does report fatigue. When I asked him how they compared to previously he said he thinks attack sleep better since he started on the beta blocker but not back to what he thinks his baseline should be. He continues to have problems with left leg pain. He had ultrasound of the left leg which were unrevealing. He also had nerve conduction studies which I sent him for which were not suggestive of neuropathy. His symptoms were worse while lying flat on the Cath Lab table and I suspect this could be again coming from his back and he may need repeat orthopedic evaluation. His primary care provider started him on Celexa recently as well for anxiety.  12/23/2015  Mr. Sisemore was seen back in the  office today in follow-up. Overall he seems to be doing well. He's tolerating low-dose carvedilol and has had some very infrequent morning dizziness. He was previously on Lasix 40 mg twice a day but ran out of the medicine about 8 days ago. He's not had any worsening weight gain or swelling nor worsening shortness of breath over the past week. This could be that he's had some improvement in his cardiomyopathy. Nevertheless, I would like him to be on some Lasix at least until we can determine whether or not he is euvolemic with lab work.  05/27/2016  Mr. Latendresse returns today for follow-up. He's gained about 6 pounds of weight since I last saw him. He denies any worsening shortness of breath or orthostatic dizziness. He saw Dr. Radford Pax in August for following of his obstructive sleep apnea. He remains physically active and denies any chest pain. He recently had a repeat echo in October 2017 which showed a small improvement in LV function with EF up  to 35-40%. Heart failure regimen includes aspirin, carvedilol, Crestor, and spironolactone. He is not currently on a ARB is he's had orthostatic symptoms in the past although that his generally resolved. His creatinine is normal.  PMHx:  Past Medical History:  Diagnosis Date  . Arthritis    Knee L - probably  . BPH (benign prostatic hyperplasia)   . CAD (coronary artery disease) 07/06/2011  . CHF (congestive heart failure) ([place])   . Coronary artery disease   . Diabetes mellitus   . Frequency of urination   . High cholesterol   . History of nuclear stress test 09/2010   dipyridamole; mild perfusion defect due to attenuation with mild superimposed ischemia in apical septal, apical, apical inferior & apical lateral regions; rest LV enlarged in size; prominent gut uptake in infero-apical region; no significant ischemia demonstrated; low risk scan   . HNP (herniated nucleus pulposus), lumbar   . Ischemic cardiomyopathy   . LV dysfunction 07/06/2011  . Nocturia   . Right bundle branch block   . 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 Ellison Hughs  . Sleep apnea    sleep study 10/2010- AHI during total sleep 32.1/hr and during REM 62.3/hr (severe sleep apnea)unable to tolerate c pap  . Spinal stenosis of lumbar region   . Stroke [facility])    [place]     Past Surgical History:  Procedure Laterality Date  . CARDIAC CATHETERIZATION  01/2011   ischemic cardiomyopathy 30-35%, multivessel CAD (Dr. [name])   . CARDIAC CATHETERIZATION  09/13/2015   Procedure: Right/Left Heart Cath and Coronary/Graft Angiography;  Surgeon: [name], MD;  Location: [facility];  Service: Cardiovascular;;  . Colonscopy    . CORONARY ARTERY BYPASS GRAFT  08/03/2011   Procedure: CORONARY ARTERY BYPASS GRAFTING (CABG);  Surgeon: [name], MD;  Location: [place];  Service: Open Heart Surgery;  Laterality: N/A;  CABG times three  using right saphenous vein and left mammary artery usisng endoscope.  Marland Kitchen LEFT HEART CATHETERIZATION WITH CORONARY/GRAFT ANGIOGRAM N/A 09/20/2013   Procedure: LEFT HEART CATHETERIZATION WITH [name];  Surgeon: [name], MD;  Location: [facility] CATH [facility];  Service: Cardiovascular;  Laterality: N/A;  . Lower Extremity Arterial Doppler  03/16/2013   bilat ABIs demonstated normal values; R runoff - posterior tibial & anterior tibial arteries occluded; L runoff - peroneal & posterior tibial arteries occluded, anterior  tibial artery appears occluded  . LUMBAR LAMINECTOMY/DECOMPRESSION MICRODISCECTOMY N/A 09/12/2014   Procedure: LUMBAR DECOMPRESSION L3-L4, L4-L5, MICRODISCECTOMY L4-L5;  Surgeon: [name], MD;  Location: [facility];  Service: Orthopedics;  Laterality: N/A;  . Pilonidal Cyst removed    . TRANSTHORACIC ECHOCARDIOGRAM  11/10/2012   EF 40-45%, mild LVH, mild hypokinesis of anteroseptal myocardium, grade 1 diastolic dysfunction; mild MR & calcifed mitral annulus; LA mild-mod dilated; RA mildly dilated    FAMHx:  Family History  Problem Relation Age of Onset  . Anesthesia problems Daughter   . Cancer Mother   . Lung disease Father     & heart disease  . Colon cancer Sister   . Sudden death Maternal Grandmother     SOCHx:   reports that he quit smoking about 4 years ago. His smoking use included Cigars. He quit after 5.00 years of use. He quit smokeless tobacco use about 4 years ago. He reports that he drinks alcohol. He reports that he does not use drugs.  ALLERGIES:  No Known Allergies  ROS: Pertinent items noted in HPI and remainder of comprehensive ROS otherwise negative.  HOME MEDS: Current Outpatient Prescriptions  Medication Sig Dispense Refill  . aspirin EC 81 MG tablet Take 81 mg by mouth daily.    . carvedilol (COREG) 6.25 MG tablet TAKE ONE TABLET BY MOUTH TWICE DAILY 60 tablet 8  . furosemide (LASIX) 40 MG tablet Take 1 tablet (40 mg total) by mouth  daily. 30 tablet 9  . gabapentin (NEURONTIN) 300 MG capsule Take 300 mg by mouth 2 (two) times daily.    Marland Kitchen glipiZIDE (GLUCOTROL XL) 10 MG 24 hr tablet Take 10 mg by mouth every morning.     . metFORMIN (GLUCOPHAGE-XR) 500 MG 24 hr tablet Take 1,000 mg by mouth 2 (two) times daily.    . polycarbophil (FIBERCON) 625 MG tablet Take 625 mg by mouth as needed for mild constipation or moderate constipation.     . rosuvastatin (CRESTOR) 20 MG tablet Take 10 mg by mouth every evening.     Marland Kitchen spironolactone (ALDACTONE) 25 MG tablet Take 0.5 tablets (12.5 mg total) by mouth daily. 15 tablet 7  . tamsulosin (FLOMAX) 0.4 MG CAPS capsule Take 1 capsule by mouth every evening.     . TRADJENTA 5 MG TABS tablet Take 5 mg by mouth daily.     No current facility-administered medications for this visit.     LABS/IMAGING: No results found for this or any previous visit (from the past 48 hour(s)). No results found.  VITALS: BP (!) 148/73   Pulse 66   Ht 6\' 2"  (1.88 m)   Wt 254 lb 12.8 oz (115.6 kg)   SpO2 (!) 66%   BMI 32.71 kg/m   EXAM: General appearance: alert and no distress Neck: no carotid bruit and no JVD Lungs: clear to auscultation bilaterally Heart: regular rate and rhythm, S1, S2 normal, no murmur, click, rub or gallop Abdomen: soft, non-tender; bowel sounds normal; no masses,  no organomegaly Extremities: extremities normal, atraumatic, no cyanosis or edema Pulses: 2+ and symmetric 2+ bilateral DP, 2+ L PT, 1+ R PT Skin: Skin color, texture, turgor normal. No rashes or lesions Neurologic: Grossly normal Psych: Pleasant  EKG: Deferred  ASSESSMENT: 1. Acute on chronic systolic congestive heart failure - EF 35-40% (03/2016) 2. Coronary artery disease status post three-vessel CABG in 2013 (LIMA to LAD, SVG to PDA and SVG to PLA) - patent grafts by cath in 08/2015 3.  Hypertension-controlled 4. Dyslipidemia 5. Diabetes type 2 - better controlled 6. Mild PVD-with normal  ABI 7. Hypotension/upper back pain - resolved 8. Spinal stenosis - s/p surgery 9. Trifascicular block 10. OSA-back on BiPAP  PLAN: 1.   Mr. Blase seems to be doing well on daily Lasix. His EF is improved mildly up to 35-40%. He is compliant with his BiPAP. Blood pressure is slightly higher today. I think he would benefit from going on to either ACE, ARB or Entresto. We discussed Entresto today and he seems amenable to this. We'll start the 24/26 mg dose. Decrease Lasix to 20 mg daily. We'll schedule follow-up with pharmacy in 2-3 weeks who can help coordinate prior authorization. Samples were provided today. He'll follow-up with me in a couple of months.  [name], MD, [facility] Attending Cardiologist [facility] C [name] 05/27/2016, 8:50 AM

## 2016-05-27 NOTE — Addendum Note (Signed)
Addended by: Ulice Brilliant T on: 05/27/2016 08:58 AM   Modules accepted: Orders

## 2016-05-27 NOTE — Patient Instructions (Addendum)
Medication Instructions:  START Entresto 24/26mg  Take 1 tablet twice a day DECREASE Lasix in half from 40mg  to 20mg ; Take 1 tablet (20mg ) once a day---YOU MAKE CUT YOUR 40MG  TABLETS IN HALF TO USE THEM UP THEN START 20MG  TABLETS IF YOU WISH   Labwork: Your physician recommends that you return for lab work in: 2 weeks complete a BMP.   Testing/Procedures: None   Follow-Up: Your physician recommends that you schedule a follow-up appointment in: 3 months with Dr Debara Pickett.   Your physician recommends that you schedule a follow-up appointment in: 2-4 weeks with [facility] [place] Pharmacist.  Any Other Special Instructions Will Be Listed Below (If Applicable).     If you need a refill on your cardiac medications before your next appointment, please call your pharmacy.

## 2016-06-19 ENCOUNTER — Telehealth: Payer: Self-pay | Admitting: Internal Medicine

## 2016-06-19 NOTE — Telephone Encounter (Signed)
Please call,says he needs to talk about his medication.

## 2016-06-19 NOTE — Telephone Encounter (Signed)
Spoke with pt states that his insurance told him that Delene Loll is not covered and we need to do a prior authorization.  Samples at the front desk with co-paycard for pt to pick up. Notified to start medication asap

## 2016-06-24 ENCOUNTER — Ambulatory Visit: Payer: Medicare Other

## 2016-06-24 NOTE — Telephone Encounter (Signed)
Prior Auth submitted via covermymeds Key: [id number]

## 2016-07-01 ENCOUNTER — Telehealth: Payer: Self-pay | Admitting: Internal Medicine

## 2016-07-01 NOTE — Telephone Encounter (Signed)
Please call,concerning his Entresto.

## 2016-07-01 NOTE — Telephone Encounter (Signed)
Called pharmacy - entresto still showing PA needed ID: [id number] Phone #: [phone]  Returned call to patient. He states [facility] in [place] told him the cost of med without insurance is $491/month.  [facility] costs 90 day supply is $125  Patient has samples of entresto but has not yet started  He provided phone #: [phone]

## 2016-07-02 MED ORDER — SACUBITRIL-VALSARTAN 24-26 MG PO TABS: 1.0000 | ORAL_TABLET | Freq: Two times a day (BID) | ORAL | 3 refills | Status: DC

## 2016-07-02 NOTE — Telephone Encounter (Signed)
Called [phone] for PA entresto 24-26mg  approved 05/03/2016 thru life of plan with bcbs  Patient notified of PA status Rx sent to [facility]

## 2016-07-29 DIAGNOSIS — I5022 Chronic systolic (congestive) heart failure: Secondary | ICD-10-CM | POA: Diagnosis not present

## 2016-07-29 DIAGNOSIS — I10 Essential (primary) hypertension: Secondary | ICD-10-CM | POA: Diagnosis not present

## 2016-07-29 DIAGNOSIS — E782 Mixed hyperlipidemia: Secondary | ICD-10-CM | POA: Diagnosis not present

## 2016-07-29 DIAGNOSIS — E1165 Type 2 diabetes mellitus with hyperglycemia: Secondary | ICD-10-CM | POA: Diagnosis not present

## 2016-07-31 DIAGNOSIS — I10 Essential (primary) hypertension: Secondary | ICD-10-CM | POA: Diagnosis not present

## 2016-07-31 DIAGNOSIS — I5022 Chronic systolic (congestive) heart failure: Secondary | ICD-10-CM | POA: Diagnosis not present

## 2016-07-31 DIAGNOSIS — E1165 Type 2 diabetes mellitus with hyperglycemia: Secondary | ICD-10-CM | POA: Diagnosis not present

## 2016-07-31 DIAGNOSIS — G4733 Obstructive sleep apnea (adult) (pediatric): Secondary | ICD-10-CM | POA: Diagnosis not present

## 2016-07-31 DIAGNOSIS — I255 Ischemic cardiomyopathy: Secondary | ICD-10-CM | POA: Diagnosis not present

## 2016-07-31 DIAGNOSIS — Z6832 Body mass index (BMI) 32.0-32.9, adult: Secondary | ICD-10-CM | POA: Diagnosis not present

## 2016-07-31 DIAGNOSIS — F411 Generalized anxiety disorder: Secondary | ICD-10-CM | POA: Diagnosis not present

## 2016-07-31 DIAGNOSIS — I5033 Acute on chronic diastolic (congestive) heart failure: Secondary | ICD-10-CM | POA: Diagnosis not present

## 2016-08-12 DIAGNOSIS — E669 Obesity, unspecified: Secondary | ICD-10-CM | POA: Diagnosis not present

## 2016-08-12 DIAGNOSIS — I5022 Chronic systolic (congestive) heart failure: Secondary | ICD-10-CM | POA: Diagnosis not present

## 2016-08-12 DIAGNOSIS — I255 Ischemic cardiomyopathy: Secondary | ICD-10-CM | POA: Diagnosis not present

## 2016-08-12 DIAGNOSIS — I10 Essential (primary) hypertension: Secondary | ICD-10-CM | POA: Diagnosis not present

## 2016-08-12 DIAGNOSIS — I2510 Atherosclerotic heart disease of native coronary artery without angina pectoris: Secondary | ICD-10-CM | POA: Diagnosis not present

## 2016-08-12 LAB — BASIC METABOLIC PANEL
BUN: 18 mg/dL (ref 7–25)
CO2: 25 mmol/L (ref 20–31)
CREATININE: 0.94 mg/dL (ref 0.70–1.18)
Calcium: 8.7 mg/dL (ref 8.6–10.3)
Chloride: 102 mmol/L (ref 98–110)
Glucose, Bld: 195 mg/dL — ABNORMAL HIGH (ref 65–99)
Potassium: 4.2 mmol/L (ref 3.5–5.3)
Sodium: 139 mmol/L (ref 135–146)

## 2016-08-18 ENCOUNTER — Ambulatory Visit: Payer: Medicare Other

## 2016-08-24 ENCOUNTER — Ambulatory Visit (INDEPENDENT_AMBULATORY_CARE_PROVIDER_SITE_OTHER): Payer: Medicare Other | Admitting: Internal Medicine

## 2016-08-24 VITALS — BP 110/70 | HR 53 | Ht 74.0 in | Wt 249.8 lb

## 2016-08-24 DIAGNOSIS — I10 Essential (primary) hypertension: Secondary | ICD-10-CM | POA: Diagnosis not present

## 2016-08-24 DIAGNOSIS — Z951 Presence of aortocoronary bypass graft: Secondary | ICD-10-CM

## 2016-08-24 DIAGNOSIS — I255 Ischemic cardiomyopathy: Secondary | ICD-10-CM

## 2016-08-24 DIAGNOSIS — I2589 Other forms of chronic ischemic heart disease: Secondary | ICD-10-CM

## 2016-08-24 DIAGNOSIS — G4733 Obstructive sleep apnea (adult) (pediatric): Secondary | ICD-10-CM

## 2016-08-24 DIAGNOSIS — R0602 Shortness of breath: Secondary | ICD-10-CM | POA: Diagnosis not present

## 2016-08-24 NOTE — Patient Instructions (Addendum)
Your physician has recommended you make the following change in your medication:   1.) decrease the furosemide down to 20 mg daily.   Your physician recommends that you schedule a follow-up appointment in: 1 month with Dr Debara Pickett

## 2016-08-24 NOTE — Progress Notes (Signed)
OFFICE NOTE  Chief Complaint:  Follow-up heart failure  Primary Care Physician: [name] [name], MD  HPI:  [name] [name] is a pleasant 71 year old gentleman previously followed by Dr. Rollene Fare. His past medical history is significant for coronary artery disease. In 2013 he underwent multivessel CABG for an ischemic cardiomyopathy. EF was 20-25% prior to surgery however post surgery his EF had improved up to 45-50%. Recently his EF was 40-45% by echo in May of 2014. There was mild mitral regurgitation, mild to moderately dilated left atrium and mild LVH. [name] [name] has been describing some anxiety. He also reports some pain in his legs however underwent Dopplers in September 2014 which showed preserved ABIs bilaterally a 1.0 on the right and 1.1 on the left. The bilateral peroneal and posterior tibial arteries were occluded. He reports some optimal control of his diabetes. His A1c was 7.2. He is concerned about the cost of taking Tradjenta. He is followed by Dr. Elyse Hsu.   He was recently seen in the emergency room and he can hospital. There he presented with hypotension and was given 1 L normal saline and his symptoms improved. He had been having dizziness as well as some upper back pain into the left shoulder blade with exertion recently. The symptoms are worse particularly when climbing up ladders or going up stairs and are relieved at rest. After that hospitalization it was recommended that he discontinue his ACE inhibitor and beta blocker, and his blood pressure appears improved today up to 110/60.  [name] [name] returns today for followup of his stress test. This is interpreted as nonischemic with an EF of 44%. He reports still feeling very fatigued and extremely short of breath when doing activities. He says that the other day he tried to hit golf balls and felt that he was totally exhausted after just an hour. He reports his blood pressure has improved somewhat off of all medications  however remains low. He has had symptoms of orthostatic hypotension in the past. His symptoms do feel similar to prior to his bypass surgery. He also feels that he was much better after surgery than he is now and that his symptoms have been going on for the past 2 months.  At his last office visit, I recommended that [name] [name] have a repeat cardiac catheterization (08/2013). This demonstrated the following:  Impression:  1. 2 vessel native CAD with occluded LAD in the mid-vessel  2. Patent LIMA-LAD, SVG to PLA and SVG to PDA grafts with TIMI III flow  3. LVEDP = 13 mmHg  4. Random PVC's were noted  Based on these findings, I could not find any new cardiac cause of his symptoms. I suspect that some of his fatigue could be related to labile blood sugars. He says unexplained hypotension but is normotensive off of medications.  [name] [name] returns today for followup. He now reports feeling better since he had change in his medications. He is established with Dr. Buddy Duty who adjusted his diabetes medicines and stopped his Invokana. His shortness of breath and fatigue in both improved. He is now been expected some problems with left heel pain. He was found to have some spinal stenosis but has had improvement with inserts in his shoes.  I saw [name] [name] back in the office today. He has successfully underwent back surgery earlier this year which she says initially was much improved, however he says he subsequently developed more pain going down his legs. This sounds like  it's neuropathic pain, but is reluctant to try medication for it. He also significantly fatigue. This could be due to a number of etiologies. 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 Also, he is noted to have a history of obstructive sleep apnea. He was placed on BiPAP, but has not been compliant with that due to difficulty wearing the mask.  He also never had follow-up of his sleep study.  [name] [name] returns today for follow-up. He reports he's had worsening dyspnea and fatigue. He says that he can hardly sleep at night. He is struggling with significant anxiety. 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 He's not been wearing the BiPAP due to significant anxiety and difficulty sleeping. He feels like he gets smothered with his machine. Communication between Dr. Radford Pax and Dr. Quintin Alto led to the starting of Celexa as well as Klonopin to use as needed at night. He says that it does give him about 6 hours of sleep. He has not been using his BiPAP as instructed. He is reportedly had more worsening shortness of breath. He was seen in the ER for this and it was thought this was due [email or web address] with BiPAP. His BNP however is elevated over 300. Today in the office he says that he's had more swelling and more abdominal fullness as well as leg edema.  I had the pleasure seeing [name] [name] back today in the office. He seems to respond nicely to Lasix. I started him on 40 mg daily and while I was out of town my partner reviewed his lab work which included an elevated BNP over 300. He increased his Lasix to 40 mg twice a day. [name] [name] says he's had a significant improvement in his breathing. The BNP now is in the mid 200s. Renal function is stable based on labs today. Unfortunately, his echocardiogram does show a new cardiomyopathy with EF 30-35%. It should be noted that he felt "awful" on blood pressure/heart failure medicines and had taken himself off of ACE inhibitor and beta blocker in the past. The echo however does suggest inferior and anterolateral wall motion abnormalities which are new and could indicate graft dysfunction. I performed his last heart catheterization in 2015 which showed all 3 grafts patent.  [name] [name] returns today for follow-up of his left heart catheterization. I  perform this a few weeks ago and he had patent bypass grafts however LV function is reduced. Cardiac output is also reduced. Filling pressures were mildly elevated. He reports since discharge that he's had some improvement of his symptoms. He was started on low-dose beta blocker but he does report fatigue. When I asked him how they compared to previously he said he thinks attack sleep better since he started on the beta blocker but not back to what he thinks his baseline should be. He continues to have problems with left leg pain. He had ultrasound of the left leg which were unrevealing. He also had nerve conduction studies which I sent him for which were not suggestive of neuropathy. His symptoms were worse while lying flat on the Cath Lab table and I suspect this could be again coming from his back and he may need repeat orthopedic evaluation. His primary care provider started him on Celexa recently as well for anxiety.  12/23/2015  [name] [name] was seen back in the  office today in follow-up. Overall he seems to be doing well. He's tolerating low-dose carvedilol and has had some very infrequent morning dizziness. He was previously on Lasix 40 mg twice a day but ran out of the medicine about 8 days ago. He's not had any worsening weight gain or swelling nor worsening shortness of breath over the past week. This could be that he's had some improvement in his cardiomyopathy. Nevertheless, I would like him to be on some Lasix at least until we can determine whether or not he is euvolemic with lab work.  05/27/2016  [name] [name] returns today for follow-up. He's gained about 6 pounds of weight since I last saw him. He denies any worsening shortness of breath or orthostatic dizziness. He saw Dr. Radford Pax in August for following of his obstructive sleep apnea. He remains physically active and denies any chest pain. He recently had a repeat echo in October 2017 which showed a small improvement in LV function with EF up  to 35-40%. Heart failure regimen includes aspirin, carvedilol, Crestor, and spironolactone. He is not currently on a ARB is he's had orthostatic symptoms in the past although that his generally resolved. His creatinine is normal.  08/24/2016  I saw [name] [name] today in follow-up. He again gained about 4 pounds over the past month. He felt that the weight gain was heart failure related because he got more short of breath particularly walking up stairs. Based on that he increased his Lasix back to 40 mg daily. He notes that his urine is been darker and his urination is not necessarily improved. He's also been more dizzy. He says he gets somewhat presyncopal with change in position. Lab work 12 days ago indicated a stable creatinine however. Despite this, I feel that he may be somewhat presyncopal perhaps on too high of a dose of diuretics. He is also on Entresto 24/26 and Aldactone 12.5 mg daily. He also complained of some pain across the back between the shoulder blades. He said this got better with resting and drinking water. It's difficult to say whether that it was the water or perhaps resting from his activities. I cannot exclude that this could be angina. His EKG however is reassuring today.  PMHx:  Past Medical History:  Diagnosis Date  . Arthritis    Knee L - probably  . BPH (benign prostatic hyperplasia)   . CAD (coronary artery disease) 07/06/2011  . CHF (congestive heart failure) ([place])   . Coronary artery disease   . Diabetes mellitus   . Frequency of urination   . High cholesterol   . History of nuclear stress test 09/2010   dipyridamole; mild perfusion defect due to attenuation with mild superimposed ischemia in apical septal, apical, apical inferior & apical lateral regions; rest LV enlarged in size; prominent gut uptake in infero-apical region; no significant ischemia demonstrated; low risk scan   . HNP (herniated nucleus pulposus), lumbar   . Ischemic cardiomyopathy   . LV dysfunction  07/06/2011  . Nocturia   . Right bundle branch block   . 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 Ellison Hughs  . Sleep apnea    sleep study 10/2010- AHI during total sleep 32.1/hr and during REM 62.3/hr (severe sleep apnea)unable to tolerate c pap  . Spinal stenosis of lumbar region   . Stroke [facility])    [place]     Past Surgical  History:  Procedure Laterality Date  . CARDIAC CATHETERIZATION  01/2011   ischemic cardiomyopathy 30-35%, multivessel CAD (Dr. [name])   . CARDIAC CATHETERIZATION  09/13/2015   Procedure: Right/Left Heart Cath and Coronary/Graft Angiography;  Surgeon: [name], MD;  Location: [place] [facility];  Service: Cardiovascular;;  . Colonscopy    . CORONARY ARTERY BYPASS GRAFT  08/03/2011   Procedure: CORONARY ARTERY BYPASS GRAFTING (CABG);  Surgeon: [name], MD;  Location: [place];  Service: Open Heart Surgery;  Laterality: N/A;  CABG times three using right saphenous vein and left mammary artery usisng endoscope.  Marland Kitchen LEFT HEART CATHETERIZATION WITH CORONARY/GRAFT ANGIOGRAM N/A 09/20/2013   Procedure: LEFT HEART CATHETERIZATION WITH [name];  Surgeon: [name], MD;  Location: [facility] CATH LAB;  Service: Cardiovascular;  Laterality: N/A;  . Lower Extremity Arterial Doppler  03/16/2013   bilat ABIs demonstated normal values; R runoff - posterior tibial & anterior tibial arteries occluded; L runoff - peroneal & posterior tibial arteries occluded, anterior tibial artery appears occluded  . LUMBAR LAMINECTOMY/DECOMPRESSION MICRODISCECTOMY N/A 09/12/2014   Procedure: LUMBAR DECOMPRESSION L3-L4, L4-L5, MICRODISCECTOMY L4-L5;  Surgeon: [name], MD;  Location: [facility];  Service: Orthopedics;  Laterality: N/A;  . Pilonidal Cyst removed    . TRANSTHORACIC ECHOCARDIOGRAM  11/10/2012   EF 40-45%, mild LVH, mild hypokinesis of anteroseptal myocardium, grade 1 diastolic  dysfunction; mild MR & calcifed mitral annulus; LA mild-mod dilated; RA mildly dilated    FAMHx:  Family History  Problem Relation Age of Onset  . Anesthesia problems Daughter   . Cancer Mother   . Lung disease Father     & heart disease  . Colon cancer Sister   . Sudden death Maternal Grandmother     SOCHx:   reports that he quit smoking about 4 years ago. His smoking use included Cigars. He quit after 5.00 years of use. He quit smokeless tobacco use about 4 years ago. He reports that he drinks alcohol. He reports that he does not use drugs.  ALLERGIES:  No Known Allergies  ROS: Pertinent items noted in HPI and remainder of comprehensive ROS otherwise negative.  HOME MEDS: Current Outpatient Prescriptions  Medication Sig Dispense Refill  . aspirin EC 81 MG tablet Take 81 mg by mouth daily.    . carvedilol (COREG) 6.25 MG tablet TAKE ONE TABLET BY MOUTH TWICE DAILY 60 tablet 8  . furosemide (LASIX) 40 MG tablet Take 20 mg by mouth daily.    Marland Kitchen gabapentin (NEURONTIN) 300 MG capsule Take 300 mg by mouth 2 (two) times daily.    Marland Kitchen glipiZIDE (GLUCOTROL XL) 10 MG 24 hr tablet Take 10 mg by mouth every morning.     . metFORMIN (GLUCOPHAGE-XR) 500 MG 24 hr tablet Take 1,000 mg by mouth 2 (two) times daily.    . polycarbophil (FIBERCON) 625 MG tablet Take 625 mg by mouth as needed for mild constipation or moderate constipation.     . rosuvastatin (CRESTOR) 20 MG tablet Take 10 mg by mouth every evening.     . sacubitril-valsartan (ENTRESTO) 24-26 MG Take 1 tablet by mouth 2 (two) times daily. 180 tablet 3  . spironolactone (ALDACTONE) 25 MG tablet Take 0.5 tablets (12.5 mg total) by mouth daily. 15 tablet 7  . tamsulosin (FLOMAX) 0.4 MG CAPS capsule Take 1 capsule by mouth every evening.     . TRADJENTA 5 MG TABS tablet Take 5 mg by mouth daily.     No  current facility-administered medications for this visit.     LABS/IMAGING: No results found for this or any previous visit (from the  past 48 hour(s)). No results found.  VITALS: BP 110/70   Pulse (!) 53   Ht 6\' 2"  (1.88 m)   Wt 249 lb 12.8 oz (113.3 kg)   BMI 32.07 kg/m   EXAM: General appearance: alert and no distress Neck: no carotid bruit and no JVD Lungs: clear to auscultation bilaterally Heart: regular rate and rhythm, S1, S2 normal, no murmur, click, rub or gallop Abdomen: soft, non-tender; bowel sounds normal; no masses,  no organomegaly Extremities: extremities normal, atraumatic, no cyanosis or edema Pulses: 2+ and symmetric 2+ bilateral DP, 2+ L PT, 1+ R PT Skin: Skin color, texture, turgor normal. No rashes or lesions Neurologic: Grossly normal Psych: Pleasant  EKG: Sinus bradycardia with first-degree AV block, PACs, RBBB at 53  ASSESSMENT: 1. Acute on chronic systolic congestive heart failure - EF 35-40% (03/2016) 2. Coronary artery disease status post three-vessel CABG in 2013 (LIMA to LAD, SVG to PDA and SVG to PLA) - patent grafts by cath in 08/2015 3. Hypertension-controlled 4. Dyslipidemia 5. Diabetes type 2 - better controlled 6. Mild PVD-with normal ABI 7. Hypotension/upper back pain - resolved 8. Spinal stenosis - s/p surgery 9. Trifascicular block 10. OSA-back on BiPAP  PLAN: 1.   Mr. Gutman has recently increased his Lasix but is feeling more orthostatic in my opinion. I advised him to decrease his Lasix back to 20 mg daily. He has not noted a symptomatic improvement with increase in Lasix despite the fact his weight is gone down about 3 or 4 pounds in the last several weeks. He did not notice any dietary changes. He should continue on Entresto as I explained to him this is a long-term therapy for him. Plan to see him back in a month and if his symptoms have not significantly improved, we may need to consider repeat ischemia testing. He did have patent bypass grafts by heart catheterization in March 2017.  [name], MD, [facility] Attending Cardiologist [place] C  [name] 08/24/2016, 10:44 AM

## 2016-09-14 NOTE — Progress Notes (Signed)
I am fine with that 

## 2016-09-15 ENCOUNTER — Telehealth: Payer: Self-pay | Admitting: *Deleted

## 2016-09-15 DIAGNOSIS — R0902 Hypoxemia: Secondary | ICD-10-CM

## 2016-09-15 NOTE — Progress Notes (Signed)
Per Dr Radford Pax a portable oxygen concentrator for travel and home has been ordered

## 2016-09-15 NOTE — Telephone Encounter (Signed)
-----   Message from Sueanne Margarita, MD sent at 09/15/2016 11:44 AM EDT -----   ----- Message ----- From: Freada Bergeron, CMA Sent: 09/15/2016   8:19 AM To: Sueanne Margarita, MD  Hello Dr Radford Pax, is that a yes or no please and thank you ----- Message ----- From: Sueanne Margarita, MD Sent: 09/14/2016  12:03 PM To: Freada Bergeron, CMA    ----- Message ----- From: Freada Bergeron, CMA Sent: 09/14/2016  10:56 AM To: Sueanne Margarita, MD  Patient would like to have an order for a portable oxygen concentrator for travel and home in lieu of the large concentrator he has

## 2016-09-15 NOTE — Telephone Encounter (Deleted)
-----   Message from Sueanne Margarita, MD sent at 09/15/2016 11:44 AM EDT -----   ----- Message ----- From: Freada Bergeron, CMA Sent: 09/15/2016   8:19 AM To: Sueanne Margarita, MD  Hello Dr Radford Pax, is that a yes or no please and thank you ----- Message ----- From: Sueanne Margarita, MD Sent: 09/14/2016  12:03 PM To: Freada Bergeron, CMA    ----- Message ----- From: Freada Bergeron, CMA Sent: 09/14/2016  10:56 AM To: Sueanne Margarita, MD  Patient would like to have an order for a portable oxygen concentrator for travel and home in lieu of the large concentrator he has

## 2016-09-15 NOTE — Progress Notes (Signed)
Yes he can have one

## 2016-09-15 NOTE — Telephone Encounter (Signed)
Per Dr Radford Pax a portable oxygen concentrator for travel and home has been ordered

## 2016-09-21 ENCOUNTER — Ambulatory Visit (INDEPENDENT_AMBULATORY_CARE_PROVIDER_SITE_OTHER): Payer: Medicare Other | Admitting: Internal Medicine

## 2016-09-21 ENCOUNTER — Encounter: Payer: Self-pay | Admitting: Internal Medicine

## 2016-09-21 VITALS — BP 118/56 | HR 78 | Ht 74.0 in | Wt 253.6 lb

## 2016-09-21 DIAGNOSIS — I5022 Chronic systolic (congestive) heart failure: Secondary | ICD-10-CM | POA: Diagnosis not present

## 2016-09-21 DIAGNOSIS — I10 Essential (primary) hypertension: Secondary | ICD-10-CM | POA: Diagnosis not present

## 2016-09-21 DIAGNOSIS — Z951 Presence of aortocoronary bypass graft: Secondary | ICD-10-CM | POA: Diagnosis not present

## 2016-09-21 DIAGNOSIS — I255 Ischemic cardiomyopathy: Secondary | ICD-10-CM | POA: Diagnosis not present

## 2016-09-21 DIAGNOSIS — R0902 Hypoxemia: Secondary | ICD-10-CM

## 2016-09-21 DIAGNOSIS — I2589 Other forms of chronic ischemic heart disease: Secondary | ICD-10-CM

## 2016-09-21 DIAGNOSIS — E11319 Type 2 diabetes mellitus with unspecified diabetic retinopathy without macular edema: Secondary | ICD-10-CM | POA: Diagnosis not present

## 2016-09-21 NOTE — Patient Instructions (Signed)
Your physician has recommended you make the following change in your medication: INCREASE lasix to 40mg  daily  Your physician wants you to follow-up in: 6 months with Dr. Debara Pickett. You will receive a reminder letter in the mail two months in advance. If you don't receive a letter, please call our office to schedule the follow-up appointment.

## 2016-09-21 NOTE — Progress Notes (Signed)
OFFICE NOTE  Chief Complaint:  Occasional dizziness  Primary Care Physician: [name] [name], MD  HPI:  [name] [name] is a pleasant 71 year old gentleman previously followed by Dr. Rollene Fare. His past medical history is significant for coronary artery disease. In 2013 he underwent multivessel CABG for an ischemic cardiomyopathy. EF was 20-25% prior to surgery however post surgery his EF had improved up to 45-50%. Recently his EF was 40-45% by echo in May of 2014. There was mild mitral regurgitation, mild to moderately dilated left atrium and mild LVH. [name] [name] has been describing some anxiety. He also reports some pain in his legs however underwent Dopplers in September 2014 which showed preserved ABIs bilaterally a 1.0 on the right and 1.1 on the left. The bilateral peroneal and posterior tibial arteries were occluded. He reports some optimal control of his diabetes. His A1c was 7.2. He is concerned about the cost of taking Tradjenta. He is followed by Dr. Elyse Hsu.   He was recently seen in the emergency room and he can hospital. There he presented with hypotension and was given 1 L normal saline and his symptoms improved. He had been having dizziness as well as some upper back pain into the left shoulder blade with exertion recently. The symptoms are worse particularly when climbing up ladders or going up stairs and are relieved at rest. After that hospitalization it was recommended that he discontinue his ACE inhibitor and beta blocker, and his blood pressure appears improved today up to 110/60.  [name] [name] returns today for followup of his stress test. This is interpreted as nonischemic with an EF of 44%. He reports still feeling very fatigued and extremely short of breath when doing activities. He says that the other day he tried to hit golf balls and felt that he was totally exhausted after just an hour. He reports his blood pressure has improved somewhat off of all medications  however remains low. He has had symptoms of orthostatic hypotension in the past. His symptoms do feel similar to prior to his bypass surgery. He also feels that he was much better after surgery than he is now and that his symptoms have been going on for the past 2 months.  At his last office visit, I recommended that [name] [name] have a repeat cardiac catheterization (08/2013). This demonstrated the following:  Impression:  1. 2 vessel native CAD with occluded LAD in the mid-vessel  2. Patent LIMA-LAD, SVG to PLA and SVG to PDA grafts with TIMI III flow  3. LVEDP = 13 mmHg  4. Random PVC's were noted  Based on these findings, I could not find any new cardiac cause of his symptoms. I suspect that some of his fatigue could be related to labile blood sugars. He says unexplained hypotension but is normotensive off of medications.  [name] [name] returns today for followup. He now reports feeling better since he had change in his medications. He is established with Dr. Buddy Duty who adjusted his diabetes medicines and stopped his Invokana. His shortness of breath and fatigue in both improved. He is now been expected some problems with left heel pain. He was found to have some spinal stenosis but has had improvement with inserts in his shoes.  I saw [name] [name] back in the office today. He has successfully underwent back surgery earlier this year which she says initially was much improved, however he says he subsequently developed more pain going down his legs. This sounds like it's  neuropathic pain, but is reluctant to try medication for it. He also significantly fatigue. This could be due to a number of etiologies. 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 Also, he is noted to have a history of obstructive sleep apnea. He was placed on BiPAP, but has not been compliant with that due to difficulty wearing the mask.  He also never had follow-up of his sleep study.  [name] [name] returns today for follow-up. He reports he's had worsening dyspnea and fatigue. He says that he can hardly sleep at night. He is struggling with significant anxiety. 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 He's not been wearing the BiPAP due to significant anxiety and difficulty sleeping. He feels like he gets smothered with his machine. Communication between Dr. Radford Pax and Dr. Quintin Alto led to the starting of Celexa as well as Klonopin to use as needed at night. He says that it does give him about 6 hours of sleep. He has not been using his BiPAP as instructed. He is reportedly had more worsening shortness of breath. He was seen in the ER for this and it was thought this was due [email or web address] with BiPAP. His BNP however is elevated over 300. Today in the office he says that he's had more swelling and more abdominal fullness as well as leg edema.  I had the pleasure seeing [name] [name] back today in the office. He seems to respond nicely to Lasix. I started him on 40 mg daily and while I was out of town my partner reviewed his lab work which included an elevated BNP over 300. He increased his Lasix to 40 mg twice a day. [name] [name] says he's had a significant improvement in his breathing. The BNP now is in the mid 200s. Renal function is stable based on labs today. Unfortunately, his echocardiogram does show a new cardiomyopathy with EF 30-35%. It should be noted that he felt "awful" on blood pressure/heart failure medicines and had taken himself off of ACE inhibitor and beta blocker in the past. The echo however does suggest inferior and anterolateral wall motion abnormalities which are new and could indicate graft dysfunction. I performed his last heart catheterization in 2015 which showed all 3 grafts patent.  [name] [name] returns today for follow-up of his left heart catheterization. I  perform this a few weeks ago and he had patent bypass grafts however LV function is reduced. Cardiac output is also reduced. Filling pressures were mildly elevated. He reports since discharge that he's had some improvement of his symptoms. He was started on low-dose beta blocker but he does report fatigue. When I asked him how they compared to previously he said he thinks attack sleep better since he started on the beta blocker but not back to what he thinks his baseline should be. He continues to have problems with left leg pain. He had ultrasound of the left leg which were unrevealing. He also had nerve conduction studies which I sent him for which were not suggestive of neuropathy. His symptoms were worse while lying flat on the Cath Lab table and I suspect this could be again coming from his back and he may need repeat orthopedic evaluation. His primary care provider started him on Celexa recently as well for anxiety.  12/23/2015  [name] [name] was seen back in the office  today in follow-up. Overall he seems to be doing well. He's tolerating low-dose carvedilol and has had some very infrequent morning dizziness. He was previously on Lasix 40 mg twice a day but ran out of the medicine about 8 days ago. He's not had any worsening weight gain or swelling nor worsening shortness of breath over the past week. This could be that he's had some improvement in his cardiomyopathy. Nevertheless, I would like him to be on some Lasix at least until we can determine whether or not he is euvolemic with lab work.  05/27/2016  [name] [name] returns today for follow-up. He's gained about 6 pounds of weight since I last saw him. He denies any worsening shortness of breath or orthostatic dizziness. He saw Dr. Radford Pax in August for following of his obstructive sleep apnea. He remains physically active and denies any chest pain. He recently had a repeat echo in October 2017 which showed a small improvement in LV function with EF up  to 35-40%. Heart failure regimen includes aspirin, carvedilol, Crestor, and spironolactone. He is not currently on a ARB is he's had orthostatic symptoms in the past although that his generally resolved. His creatinine is normal.  08/24/2016  I saw [name] [name] today in follow-up. He again gained about 4 pounds over the past month. He felt that the weight gain was heart failure related because he got more short of breath particularly walking up stairs. Based on that he increased his Lasix back to 40 mg daily. He notes that his urine is been darker and his urination is not necessarily improved. He's also been more dizzy. He says he gets somewhat presyncopal with change in position. Lab work 12 days ago indicated a stable creatinine however. Despite this, I feel that he may be somewhat presyncopal perhaps on too high of a dose of diuretics. He is also on Entresto 24/26 and Aldactone 12.5 mg daily. He also complained of some pain across the back between the shoulder blades. He said this got better with resting and drinking water. It's difficult to say whether that it was the water or perhaps resting from his activities. I cannot exclude that this could be angina. His EKG however is reassuring today.  09/21/2016  [name] [name] was seen today in follow-up. I decreased his Lasix, however he does not feel any change in his dizziness. There is no evidence of presyncope. He has gained about 4 pounds which I suspect is some fluid retention. Blood pressure is well-controlled today 118/56.  PMHx:  Past Medical History:  Diagnosis Date  . Arthritis    Knee L - probably  . BPH (benign prostatic hyperplasia)   . CAD (coronary artery disease) 07/06/2011  . CHF (congestive heart failure) ([place])   . Coronary artery disease   . Diabetes mellitus   . Frequency of urination   . High cholesterol   . History of nuclear stress test 09/2010   dipyridamole; mild perfusion defect due to attenuation with mild superimposed ischemia  in apical septal, apical, apical inferior & apical lateral regions; rest LV enlarged in size; prominent gut uptake in infero-apical region; no significant ischemia demonstrated; low risk scan   . HNP (herniated nucleus pulposus), lumbar   . Ischemic cardiomyopathy   . LV dysfunction 07/06/2011  . Nocturia   . Right bundle branch block   . 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 Ellison Hughs  .  Sleep apnea    sleep study 10/2010- AHI during total sleep 32.1/hr and during REM 62.3/hr (severe sleep apnea)unable to tolerate c pap  . Spinal stenosis of lumbar region   . Stroke [facility])    [place]     Past Surgical History:  Procedure Laterality Date  . CARDIAC CATHETERIZATION  01/2011   ischemic cardiomyopathy 30-35%, multivessel CAD (Dr. [name])   . CARDIAC CATHETERIZATION  09/13/2015   Procedure: Right/Left Heart Cath and Coronary/Graft Angiography;  Surgeon: [name], MD;  Location: [facility];  Service: Cardiovascular;;  . Colonscopy    . CORONARY ARTERY BYPASS GRAFT  08/03/2011   Procedure: CORONARY ARTERY BYPASS GRAFTING (CABG);  Surgeon: [name], MD;  Location: [facility];  Service: Open Heart Surgery;  Laterality: N/A;  CABG times three using right saphenous vein and left mammary artery usisng endoscope.  Marland Kitchen LEFT HEART CATHETERIZATION WITH CORONARY/GRAFT ANGIOGRAM N/A 09/20/2013   Procedure: LEFT HEART CATHETERIZATION WITH [name];  Surgeon: [name], MD;  Location[facility] C[facility];  Service: Cardiovascular;  Laterality: N/A;  . Lower Extremity Arterial Doppler  03/16/2013   bilat ABIs demonstated normal values; R runoff - posterior tibial & anterior tibial arteries occluded; L runoff - peroneal & posterior tibial arteries occluded, anterior tibial artery appears occluded  . LUMBAR LAMINECTOMY/DECOMPRESSION MICRODISCECTOMY N/A 09/12/2014   Procedure: LUMBAR DECOMPRESSION L3-L4,  L4-L5, MICRODISCECTOMY L4-L5;  Surgeon: [name], MD;  Location: [facility];  Service: Orthopedics;  Laterality: N/A;  . Pilonidal Cyst removed    . TRANSTHORACIC ECHOCARDIOGRAM  11/10/2012   EF 40-45%, mild LVH, mild hypokinesis of anteroseptal myocardium, grade 1 diastolic dysfunction; mild MR & calcifed mitral annulus; LA mild-mod dilated; RA mildly dilated    FAMHx:  Family History  Problem Relation Age of Onset  . Anesthesia problems Daughter   . Cancer Mother   . Lung disease Father     & heart disease  . Colon cancer Sister   . Sudden death Maternal Grandmother     SOCHx:   reports that he quit smoking about 5 years ago. His smoking use included Cigars. He quit after 5.00 years of use. He quit smokeless tobacco use about 4 years ago. He reports that he drinks alcohol. He reports that he does not use drugs.  ALLERGIES:  No Known Allergies  ROS: Pertinent items noted in HPI and remainder of comprehensive ROS otherwise negative.  HOME MEDS: Current Outpatient Prescriptions  Medication Sig Dispense Refill  . aspirin EC 81 MG tablet Take 81 mg by mouth daily.    . carvedilol (COREG) 6.25 MG tablet TAKE ONE TABLET BY MOUTH TWICE DAILY 60 tablet 8  . furosemide (LASIX) 40 MG tablet Take 40 mg by mouth daily.     Marland Kitchen gabapentin (NEURONTIN) 300 MG capsule Take 300 mg by mouth 2 (two) times daily.    Marland Kitchen glipiZIDE (GLUCOTROL XL) 10 MG 24 hr tablet Take 10 mg by mouth every morning.     . metFORMIN (GLUCOPHAGE-XR) 500 MG 24 hr tablet Take 1,000 mg by mouth 2 (two) times daily.    . polycarbophil (FIBERCON) 625 MG tablet Take 625 mg by mouth as needed for mild constipation or moderate constipation.     . rosuvastatin (CRESTOR) 20 MG tablet Take 10 mg by mouth every evening.     . sacubitril-valsartan (ENTRESTO) 24-26 MG Take 1 tablet by mouth 2 (two) times daily. 180 tablet 3  . spironolactone (ALDACTONE) 25 MG tablet Take  0.5 tablets (12.5 mg total) by mouth daily. 15 tablet 7  .  tamsulosin (FLOMAX) 0.4 MG CAPS capsule Take 1 capsule by mouth every evening.     . TRADJENTA 5 MG TABS tablet Take 5 mg by mouth daily.     No current facility-administered medications for this visit.     LABS/IMAGING: No results found for this or any previous visit (from the past 48 hour(s)). No results found.  VITALS: BP (!) 118/56   Pulse 78   Ht 6\' 2"  (1.88 m)   Wt 253 lb 9.6 oz (115 kg)   SpO2 98%   BMI 32.56 kg/m   EXAM: General appearance: alert and no distress Neck: no carotid bruit and no JVD Lungs: clear to auscultation bilaterally Heart: regular rate and rhythm, S1, S2 normal, no murmur, click, rub or gallop Abdomen: soft, non-tender; bowel sounds normal; no masses,  no organomegaly Extremities: extremities normal, atraumatic, no cyanosis or edema Pulses: 2+ and symmetric 2+ bilateral DP, 2+ L PT, 1+ R PT Skin: Skin color, texture, turgor normal. No rashes or lesions Neurologic: Grossly normal Psych: Pleasant  EKG: Deferred  ASSESSMENT: 1. Chronic systolic congestive heart failure - EF 35-40% (03/2016) 2. Coronary artery disease status post three-vessel CABG in 2013 (LIMA to LAD, SVG to PDA and SVG to PLA) - patent grafts by cath in 08/2015 3. Hypertension-controlled 4. Dyslipidemia 5. Diabetes type 2 - better controlled 6. Mild PVD-with normal ABI 7. Hypotension/upper back pain - resolved 8. Spinal stenosis - s/p surgery 9. Trifascicular block 10. OSA-back on BiPAP  PLAN: 1.   [name] [name] really did not feel any difference on lower dose Lasix. His weight is back up again. I like him to go back to 40 mg daily of Lasix and continue his current dose of Entresto. Weight has gone up about 4 pounds. Blood pressure is well-controlled today. He has inquired about possibly getting a portable oxygen concentrator to use while he travels with his CPAP since he uses it as a bleed in at night. I'll notify Dr. Radford Pax to see if she can help arrange for this. We'll plan to  see him back in 6 months or sooner as necessary.   [name], MD, [facility] Attending Cardiologist [place] [facility] 09/21/2016, 9:25 AM

## 2016-10-12 DIAGNOSIS — H40013 Open angle with borderline findings, low risk, bilateral: Secondary | ICD-10-CM | POA: Diagnosis not present

## 2016-10-12 DIAGNOSIS — H25043 Posterior subcapsular polar age-related cataract, bilateral: Secondary | ICD-10-CM | POA: Diagnosis not present

## 2016-10-12 DIAGNOSIS — H25012 Cortical age-related cataract, left eye: Secondary | ICD-10-CM | POA: Diagnosis not present

## 2016-10-12 DIAGNOSIS — H2512 Age-related nuclear cataract, left eye: Secondary | ICD-10-CM | POA: Diagnosis not present

## 2016-10-12 DIAGNOSIS — H2513 Age-related nuclear cataract, bilateral: Secondary | ICD-10-CM | POA: Diagnosis not present

## 2016-10-12 DIAGNOSIS — H25013 Cortical age-related cataract, bilateral: Secondary | ICD-10-CM | POA: Diagnosis not present

## 2016-10-20 DIAGNOSIS — H25811 Combined forms of age-related cataract, right eye: Secondary | ICD-10-CM | POA: Diagnosis not present

## 2016-10-20 DIAGNOSIS — H2511 Age-related nuclear cataract, right eye: Secondary | ICD-10-CM | POA: Diagnosis not present

## 2016-11-11 DIAGNOSIS — H2512 Age-related nuclear cataract, left eye: Secondary | ICD-10-CM | POA: Diagnosis not present

## 2016-11-11 DIAGNOSIS — H25012 Cortical age-related cataract, left eye: Secondary | ICD-10-CM | POA: Diagnosis not present

## 2016-11-11 DIAGNOSIS — H25042 Posterior subcapsular polar age-related cataract, left eye: Secondary | ICD-10-CM | POA: Diagnosis not present

## 2016-11-17 DIAGNOSIS — H2512 Age-related nuclear cataract, left eye: Secondary | ICD-10-CM | POA: Diagnosis not present

## 2016-11-17 DIAGNOSIS — H25812 Combined forms of age-related cataract, left eye: Secondary | ICD-10-CM | POA: Diagnosis not present

## 2016-12-01 DIAGNOSIS — I10 Essential (primary) hypertension: Secondary | ICD-10-CM | POA: Diagnosis not present

## 2016-12-01 DIAGNOSIS — I5022 Chronic systolic (congestive) heart failure: Secondary | ICD-10-CM | POA: Diagnosis not present

## 2016-12-01 DIAGNOSIS — E1165 Type 2 diabetes mellitus with hyperglycemia: Secondary | ICD-10-CM | POA: Diagnosis not present

## 2016-12-01 DIAGNOSIS — E7800 Pure hypercholesterolemia, unspecified: Secondary | ICD-10-CM | POA: Diagnosis not present

## 2016-12-01 DIAGNOSIS — E1140 Type 2 diabetes mellitus with diabetic neuropathy, unspecified: Secondary | ICD-10-CM | POA: Diagnosis not present

## 2016-12-01 DIAGNOSIS — F411 Generalized anxiety disorder: Secondary | ICD-10-CM | POA: Diagnosis not present

## 2016-12-01 DIAGNOSIS — G4733 Obstructive sleep apnea (adult) (pediatric): Secondary | ICD-10-CM | POA: Diagnosis not present

## 2016-12-01 DIAGNOSIS — I5033 Acute on chronic diastolic (congestive) heart failure: Secondary | ICD-10-CM | POA: Diagnosis not present

## 2016-12-01 DIAGNOSIS — E782 Mixed hyperlipidemia: Secondary | ICD-10-CM | POA: Diagnosis not present

## 2016-12-04 DIAGNOSIS — Z6832 Body mass index (BMI) 32.0-32.9, adult: Secondary | ICD-10-CM | POA: Diagnosis not present

## 2016-12-04 DIAGNOSIS — E1140 Type 2 diabetes mellitus with diabetic neuropathy, unspecified: Secondary | ICD-10-CM | POA: Diagnosis not present

## 2016-12-04 DIAGNOSIS — I5033 Acute on chronic diastolic (congestive) heart failure: Secondary | ICD-10-CM | POA: Diagnosis not present

## 2016-12-04 DIAGNOSIS — N401 Enlarged prostate with lower urinary tract symptoms: Secondary | ICD-10-CM | POA: Diagnosis not present

## 2016-12-04 DIAGNOSIS — I5022 Chronic systolic (congestive) heart failure: Secondary | ICD-10-CM | POA: Diagnosis not present

## 2016-12-04 DIAGNOSIS — F411 Generalized anxiety disorder: Secondary | ICD-10-CM | POA: Diagnosis not present

## 2016-12-04 DIAGNOSIS — Z1389 Encounter for screening for other disorder: Secondary | ICD-10-CM | POA: Diagnosis not present

## 2016-12-04 DIAGNOSIS — I10 Essential (primary) hypertension: Secondary | ICD-10-CM | POA: Diagnosis not present

## 2016-12-04 DIAGNOSIS — I255 Ischemic cardiomyopathy: Secondary | ICD-10-CM | POA: Diagnosis not present

## 2016-12-04 DIAGNOSIS — K5901 Slow transit constipation: Secondary | ICD-10-CM | POA: Diagnosis not present

## 2016-12-04 DIAGNOSIS — E782 Mixed hyperlipidemia: Secondary | ICD-10-CM | POA: Diagnosis not present

## 2016-12-04 DIAGNOSIS — G4733 Obstructive sleep apnea (adult) (pediatric): Secondary | ICD-10-CM | POA: Diagnosis not present

## 2016-12-04 DIAGNOSIS — E1165 Type 2 diabetes mellitus with hyperglycemia: Secondary | ICD-10-CM | POA: Diagnosis not present

## 2016-12-04 DIAGNOSIS — M5432 Sciatica, left side: Secondary | ICD-10-CM | POA: Diagnosis not present

## 2016-12-18 ENCOUNTER — Other Ambulatory Visit: Payer: Self-pay | Admitting: Internal Medicine

## 2017-01-10 ENCOUNTER — Other Ambulatory Visit: Payer: Self-pay | Admitting: Internal Medicine

## 2017-01-11 ENCOUNTER — Telehealth: Payer: Self-pay | Admitting: Internal Medicine

## 2017-01-11 NOTE — Telephone Encounter (Signed)
Returned call to patient He states he is dizzy more than normal He is usually dizzy upon standing, but this is worse This has been going on for a few weeks He states it is not 100% of the time that this occurs His BP has been running low for about 3 weeks It is not every day that his BP is low, but it'll just "drop" BP was 100/50 one day last week, also 90/50 BP today was 140/68 - felt good today Patient does not record blood pressure He randomly checks his BP when he feels dizzy Advised patient to monitor home BP at least daily, twice daily if able and check about 1 hour after he takes coreg/entresto. Record readings and call back in about 1 week with the results to see if med changes are warranted based on home BP trends.  Advised will route to MD for further advice

## 2017-01-11 NOTE — Telephone Encounter (Signed)
Mr.Raczynski is calling because he has been feeling bad and dizzy and his blood pressure has been running low (100/50) . Please call

## 2017-01-12 NOTE — Telephone Encounter (Signed)
I agree with that advice - if bp persistently low, may need to reduce meds.  Dr. Lemmie Evens

## 2017-01-12 NOTE — Telephone Encounter (Signed)
Will await patient's return call w/BP readings.

## 2017-01-21 ENCOUNTER — Telehealth: Payer: Self-pay | Admitting: Internal Medicine

## 2017-01-21 NOTE — Telephone Encounter (Signed)
New message    Pt is calling to report his BP for the last week.   106/48 114/47 106/48 133/59 113/48 120/54 149/68 120/54 113/54 124/59 102/49 129/53 115/45 139/58 114/47

## 2017-01-21 NOTE — Telephone Encounter (Signed)
See triage note 01/11/17 for reference - routed to MD

## 2017-01-22 NOTE — Telephone Encounter (Signed)
BP improved - not hypotensive. No other changes.  Dr. Lemmie Evens

## 2017-01-22 NOTE — Telephone Encounter (Signed)
Patient notified MD reviewed BP readings and did not recommend med changes. He voiced understanding but expressed concern over dizziness. Advised to continue monitoring home BPs if he chooses

## 2017-01-29 ENCOUNTER — Other Ambulatory Visit: Payer: Self-pay | Admitting: Internal Medicine

## 2017-02-01 ENCOUNTER — Telehealth: Payer: Self-pay | Admitting: Internal Medicine

## 2017-02-01 NOTE — Telephone Encounter (Signed)
I think the dizziness pre-dated the [facility] - however, this is a listed side effect of the medicine. Would advise discontinuing it and see if his symptoms improve.  Dr. Lemmie Evens

## 2017-02-01 NOTE — Telephone Encounter (Signed)
Returned call to patient's daughter and provided MD recommendations. Advised to monitor for s/sx of SOB/swelling and monitor for dizziness. Advised to track home BPs. Advised patient/daughter to call back in about a week with update.   Spoke with patient - he states for the last few months, everything he does feels like a "chore".   Daughter/patient asked if he should adjust lasix dose, as this medication was changed around the same time entresto was started. Advised to continue all current medications and any further changes will be made based on BP readings/symptoms.   Scheduled for 6 month visit 02/25/17 @ 4pm

## 2017-02-01 NOTE — Telephone Encounter (Signed)
Returned call to patient's daughter - they are on vacation together and patient complains of dizziness. She states he is very dizzy when he stands, ok sitting. She states he has told her he has had these symptoms since a few weeks after starting Entresto. She states he just doesn't feel good. Patient called in a few weeks back w/BP readings + c/o dizziness - reviewed by MD - not hypotensive. Daughter reports patient is unable to play golf, do thing to enjoy quality of life, per daughter.  She states his blood sugars are well-controlled, so they don't attribute symptoms to this. Advised will defer to MD to review and advise.

## 2017-02-01 NOTE — Telephone Encounter (Signed)
Calling for advise on med-Entresto-he feels unsteady, dizzy and nauseous after taking med-she wants to know what needs to be done, changing med or dosage...--Pls call [phone] dtr Madelynn Done

## 2017-02-25 ENCOUNTER — Encounter: Payer: Self-pay | Admitting: Internal Medicine

## 2017-02-25 ENCOUNTER — Ambulatory Visit (INDEPENDENT_AMBULATORY_CARE_PROVIDER_SITE_OTHER): Payer: Medicare Other | Admitting: Internal Medicine

## 2017-02-25 VITALS — BP 122/52 | HR 75 | Ht 74.0 in | Wt 254.0 lb

## 2017-02-25 DIAGNOSIS — Z951 Presence of aortocoronary bypass graft: Secondary | ICD-10-CM

## 2017-02-25 DIAGNOSIS — I255 Ischemic cardiomyopathy: Secondary | ICD-10-CM | POA: Diagnosis not present

## 2017-02-25 DIAGNOSIS — G4733 Obstructive sleep apnea (adult) (pediatric): Secondary | ICD-10-CM | POA: Diagnosis not present

## 2017-02-25 DIAGNOSIS — I2589 Other forms of chronic ischemic heart disease: Secondary | ICD-10-CM | POA: Diagnosis not present

## 2017-02-25 DIAGNOSIS — I951 Orthostatic hypotension: Secondary | ICD-10-CM

## 2017-02-25 DIAGNOSIS — I10 Essential (primary) hypertension: Secondary | ICD-10-CM | POA: Diagnosis not present

## 2017-02-25 DIAGNOSIS — I5022 Chronic systolic (congestive) heart failure: Secondary | ICD-10-CM

## 2017-02-25 NOTE — Patient Instructions (Addendum)
Your physician wants you to follow-up in: 6 months with Dr. [name]. You will receive a reminder letter in the mail two months in advance. If you don't receive a letter, please call our office to schedule the follow-up appointment.    

## 2017-02-26 NOTE — Progress Notes (Signed)
OFFICE NOTE  Chief Complaint:  Follow-up CHF  Primary Care Physician: [name] [name], MD  HPI:  [name] [name] is a pleasant 71 year old gentleman previously followed by Dr. Rollene Fare. His past medical history is significant for coronary artery disease. In 2013 he underwent multivessel CABG for an ischemic cardiomyopathy. EF was 20-25% prior to surgery however post surgery his EF had improved up to 45-50%. Recently his EF was 40-45% by echo in May of 2014. There was mild mitral regurgitation, mild to moderately dilated left atrium and mild LVH. [name] [name] has been describing some anxiety. He also reports some pain in his legs however underwent Dopplers in September 2014 which showed preserved ABIs bilaterally a 1.0 on the right and 1.1 on the left. The bilateral peroneal and posterior tibial arteries were occluded. He reports some optimal control of his diabetes. His A1c was 7.2. He is concerned about the cost of taking Tradjenta. He is followed by Dr. Elyse Hsu.   He was recently seen in the emergency room and he can hospital. There he presented with hypotension and was given 1 L normal saline and his symptoms improved. He had been having dizziness as well as some upper back pain into the left shoulder blade with exertion recently. The symptoms are worse particularly when climbing up ladders or going up stairs and are relieved at rest. After that hospitalization it was recommended that he discontinue his ACE inhibitor and beta blocker, and his blood pressure appears improved today up to 110/60.  [name] [name] returns today for followup of his stress test. This is interpreted as nonischemic with an EF of 44%. He reports still feeling very fatigued and extremely short of breath when doing activities. He says that the other day he tried to hit golf balls and felt that he was totally exhausted after just an hour. He reports his blood pressure has improved somewhat off of all medications however  remains low. He has had symptoms of orthostatic hypotension in the past. His symptoms do feel similar to prior to his bypass surgery. He also feels that he was much better after surgery than he is now and that his symptoms have been going on for the past 2 months.  At his last office visit, I recommended that [name] have a repeat cardiac catheterization (08/2013). This demonstrated the following:  Impression:  1. 2 vessel native CAD with occluded LAD in the mid-vessel  2. Patent LIMA-LAD, SVG to PLA and SVG to PDA grafts with TIMI III flow  3. LVEDP = 13 mmHg  4. Random PVC's were noted  Based on these findings, I could not find any new cardiac cause of his symptoms. I suspect that some of his fatigue could be related to labile blood sugars. He says unexplained hypotension but is normotensive off of medications.  [name] [name] returns today for followup. He now reports feeling better since he had change in his medications. He is established with Dr. Buddy Duty who adjusted his diabetes medicines and stopped his Invokana. His shortness of breath and fatigue in both improved. He is now been expected some problems with left heel pain. He was found to have some spinal stenosis but has had improvement with inserts in his shoes.  I saw [name] [name] back in the office today. He has successfully underwent back surgery earlier this year which she says initially was much improved, however he says he subsequently developed more pain going down his legs. This sounds like it's  neuropathic pain, but is reluctant to try medication for it. He also significantly fatigue. This could be due to a number of etiologies. 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 Also, he is noted to have a history of obstructive sleep apnea. He was placed on BiPAP, but has not been compliant with that due to difficulty wearing the mask. He also  never had follow-up of his sleep study.  [name] [name] returns today for follow-up. He reports he's had worsening dyspnea and fatigue. He says that he can hardly sleep at night. He is struggling with significant anxiety. 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 He's not been wearing the BiPAP due to significant anxiety and difficulty sleeping. He feels like he gets smothered with his machine. Communication between Dr. Radford Pax and Dr. Quintin Alto led to the starting of Celexa as well as Klonopin to use as needed at night. He says that it does give him about 6 hours of sleep. He has not been using his BiPAP as instructed. He is reportedly had more worsening shortness of breath. He was seen in the ER for this and it was thought this was due [email or web address] with BiPAP. His BNP however is elevated over 300. Today in the office he says that he's had more swelling and more abdominal fullness as well as leg edema.  I had the pleasure seeing [name] [name] back today in the office. He seems to respond nicely to Lasix. I started him on 40 mg daily and while I was out of town my partner reviewed his lab work which included an elevated BNP over 300. He increased his Lasix to 40 mg twice a day. [name] [name] says he's had a significant improvement in his breathing. The BNP now is in the mid 200s. Renal function is stable based on labs today. Unfortunately, his echocardiogram does show a new cardiomyopathy with EF 30-35%. It should be noted that he felt "awful" on blood pressure/heart failure medicines and had taken himself off of ACE inhibitor and beta blocker in the past. The echo however does suggest inferior and anterolateral wall motion abnormalities which are new and could indicate graft dysfunction. I performed his last heart catheterization in 2015 which showed all 3 grafts patent.  [name] [name] returns today for follow-up of his left heart catheterization. I perform  this a few weeks ago and he had patent bypass grafts however LV function is reduced. Cardiac output is also reduced. Filling pressures were mildly elevated. He reports since discharge that he's had some improvement of his symptoms. He was started on low-dose beta blocker but he does report fatigue. When I asked him how they compared to previously he said he thinks attack sleep better since he started on the beta blocker but not back to what he thinks his baseline should be. He continues to have problems with left leg pain. He had ultrasound of the left leg which were unrevealing. He also had nerve conduction studies which I sent him for which were not suggestive of neuropathy. His symptoms were worse while lying flat on the Cath Lab table and I suspect this could be again coming from his back and he may need repeat orthopedic evaluation. His primary care provider started him on Celexa recently as well for anxiety.  12/23/2015  Mr. Corporan was seen back in the office  today in follow-up. Overall he seems to be doing well. He's tolerating low-dose carvedilol and has had some very infrequent morning dizziness. He was previously on Lasix 40 mg twice a day but ran out of the medicine about 8 days ago. He's not had any worsening weight gain or swelling nor worsening shortness of breath over the past week. This could be that he's had some improvement in his cardiomyopathy. Nevertheless, I would like him to be on some Lasix at least until we can determine whether or not he is euvolemic with lab work.  05/27/2016  Mr. Skowron returns today for follow-up. He's gained about 6 pounds of weight since I last saw him. He denies any worsening shortness of breath or orthostatic dizziness. He saw Dr. Radford Pax in August for following of his obstructive sleep apnea. He remains physically active and denies any chest pain. He recently had a repeat echo in October 2017 which showed a small improvement in LV function with EF up to  35-40%. Heart failure regimen includes aspirin, carvedilol, Crestor, and spironolactone. He is not currently on a ARB is he's had orthostatic symptoms in the past although that his generally resolved. His creatinine is normal.  08/24/2016  I saw Mr. Chandley today in follow-up. He again gained about 4 pounds over the past month. He felt that the weight gain was heart failure related because he got more short of breath particularly walking up stairs. Based on that he increased his Lasix back to 40 mg daily. He notes that his urine is been darker and his urination is not necessarily improved. He's also been more dizzy. He says he gets somewhat presyncopal with change in position. Lab work 12 days ago indicated a stable creatinine however. Despite this, I feel that he may be somewhat presyncopal perhaps on too high of a dose of diuretics. He is also on Entresto 24/26 and Aldactone 12.5 mg daily. He also complained of some pain across the back between the shoulder blades. He said this got better with resting and drinking water. It's difficult to say whether that it was the water or perhaps resting from his activities. I cannot exclude that this could be angina. His EKG however is reassuring today.  09/21/2016  Mr. Barnhart was seen today in follow-up. I decreased his Lasix, however he does not feel any change in his dizziness. There is no evidence of presyncope. He has gained about 4 pounds which I suspect is some fluid retention. Blood pressure is well-controlled today 118/56.  02/26/2017  Mr. Abruzzo returns today for follow-up. He had more recent dizziness and it seemed to worsen on Entresto. He discontinued that and feels that he's now much better. Overall he says he feels well. He is not on ACE inhibitor or ARB but remains on carvedilol. Is also on Lasix 40 mg daily. Blood pressure is stable 122/52.  PMHx:  Past Medical History:  Diagnosis Date  . Arthritis    Knee L - probably  . BPH (benign prostatic  hyperplasia)   . CAD (coronary artery disease) 07/06/2011  . CHF (congestive heart failure) ([place])   . Coronary artery disease   . Diabetes mellitus   . Frequency of urination   . High cholesterol   . History of nuclear stress test 09/2010   dipyridamole; mild perfusion defect due to attenuation with mild superimposed ischemia in apical septal, apical, apical inferior & apical lateral regions; rest LV enlarged in size; prominent gut uptake in infero-apical region; no significant ischemia  demonstrated; low risk scan   . HNP (herniated nucleus pulposus), lumbar   . Ischemic cardiomyopathy   . LV dysfunction 07/06/2011  . Nocturia   . Right bundle branch block   . 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 Ellison Hughs  . Sleep apnea    sleep study 10/2010- AHI during total sleep 32.1/hr and during REM 62.3/hr (severe sleep apnea)unable to tolerate c pap  . Spinal stenosis of lumbar region   . Stroke [facility])    [place]     Past Surgical History:  Procedure Laterality Date  . CARDIAC CATHETERIZATION  01/2011   ischemic cardiomyopathy 30-35%, multivessel CAD (Dr. [name])   . CARDIAC CATHETERIZATION  09/13/2015   Procedure: Right/Left Heart Cath and Coronary/Graft Angiography;  Surgeon: [name], MD;  Location: [facility] [facility];  Service: Cardiovascular;;  . Colonscopy    . CORONARY ARTERY BYPASS GRAFT  08/03/2011   Procedure: CORONARY ARTERY BYPASS GRAFTING (CABG);  Surgeon: [name], MD;  Location: [facility];  Service: Open Heart Surgery;  Laterality: N/A;  CABG times three using right saphenous vein and left mammary artery usisng endoscope.  Marland Kitchen LEFT HEART CATHETERIZATION WITH CORONARY/GRAFT ANGIOGRAM N/A 09/20/2013   Procedure: LEFT HEART CATHETERIZATION WITH [name];  Surgeon: [name], MD;  Location[facility] CATH [facility];  Service: Cardiovascular;  Laterality: N/A;  . Lower Extremity Arterial  Doppler  03/16/2013   bilat ABIs demonstated normal values; R runoff - posterior tibial & anterior tibial arteries occluded; L runoff - peroneal & posterior tibial arteries occluded, anterior tibial artery appears occluded  . LUMBAR LAMINECTOMY/DECOMPRESSION MICRODISCECTOMY N/A 09/12/2014   Procedure: LUMBAR DECOMPRESSION L3-L4, L4-L5, MICRODISCECTOMY L4-L5;  Surgeon: [name], MD;  Location: [facility];  Service: Orthopedics;  Laterality: N/A;  . Pilonidal Cyst removed    . TRANSTHORACIC ECHOCARDIOGRAM  11/10/2012   EF 40-45%, mild LVH, mild hypokinesis of anteroseptal myocardium, grade 1 diastolic dysfunction; mild MR & calcifed mitral annulus; LA mild-mod dilated; RA mildly dilated    FAMHx:  Family History  Problem Relation Age of Onset  . Anesthesia problems Daughter   . Cancer Mother   . Lung disease Father        & heart disease  . Colon cancer Sister   . Sudden death Maternal Grandmother     SOCHx:   reports that he quit smoking about 5 years ago. His smoking use included Cigars. He quit after 5.00 years of use. He quit smokeless tobacco use about 5 years ago. He reports that he drinks alcohol. He reports that he does not use drugs.  ALLERGIES:  Allergies  Allergen Reactions  . Entresto [Sacubitril-Valsartan] Other (See Comments)    dizziness    ROS: Pertinent items noted in HPI and remainder of comprehensive ROS otherwise negative.  HOME MEDS: Current Outpatient Prescriptions  Medication Sig Dispense Refill  . aspirin EC 81 MG tablet Take 81 mg by mouth daily.    . carvedilol (COREG) 6.25 MG tablet Take 1 tablet (6.25 mg total) by mouth 2 (two) times daily. 180 tablet 2  . furosemide (LASIX) 40 MG tablet TAKE ONE TABLET BY MOUTH ONCE DAILY 30 tablet 9  . gabapentin (NEURONTIN) 300 MG capsule Take 300 mg by mouth 2 (two) times daily.    Marland Kitchen glipiZIDE (GLUCOTROL XL) 10 MG 24 hr tablet Take 10 mg by mouth every morning.     Marland Kitchen  metFORMIN (GLUCOPHAGE-XR) 500 MG 24 hr tablet  Take 1,000 mg by mouth 2 (two) times daily.    . polycarbophil (FIBERCON) 625 MG tablet Take 625 mg by mouth as needed for mild constipation or moderate constipation.     . rosuvastatin (CRESTOR) 20 MG tablet Take 10 mg by mouth every evening.     Marland Kitchen spironolactone (ALDACTONE) 25 MG tablet TAKE ONE-HALF TABLET BY MOUTH ONCE DAILY 15 tablet 2  . tamsulosin (FLOMAX) 0.4 MG CAPS capsule Take 1 capsule by mouth every evening.     . TRADJENTA 5 MG TABS tablet Take 5 mg by mouth daily.     No current facility-administered medications for this visit.     LABS/IMAGING: No results found for this or any previous visit (from the past 48 hour(s)). No results found.  VITALS: BP (!) 122/52   Pulse 75   Ht 6\' 2"  (1.88 m)   Wt 254 lb (115.2 kg)   BMI 32.61 kg/m   EXAM: General appearance: alert and no distress Neck: no carotid bruit and no JVD Lungs: clear to auscultation bilaterally Heart: regular rate and rhythm, S1, S2 normal, no murmur, click, rub or gallop Abdomen: soft, non-tender; bowel sounds normal; no masses,  no organomegaly Extremities: extremities normal, atraumatic, no cyanosis or edema Pulses: 2+ and symmetric 2+ bilateral DP, 2+ L PT, 1+ R PT Skin: Skin color, texture, turgor normal. No rashes or lesions Neurologic: Grossly normal Psych: Pleasant  EKG: Sinus rhythm with first-degree AV block at 75 - Personally reviewed  ASSESSMENT: 1. Chronic systolic congestive heart failure - EF 35-40% (03/2016) 2. Coronary artery disease status post three-vessel CABG in 2013 (LIMA to LAD, SVG to PDA and SVG to PLA) - patent grafts by cath in 08/2015 3. Hypertension-controlled 4. Dyslipidemia 5. Diabetes type 2 - better controlled 6. Mild PVD-with normal ABI 7. Hypotension/upper back pain - resolved 8. Spinal stenosis - s/p surgery 9. Trifascicular block 10. OSA-back on BiPAP  PLAN: 1.   Mr. Laymon said he felt much worse on Entresto and since discontinuing it feels better. Most of  this is an intolerance. It may be due to blood pressure lowering orthostatic symptoms. I'm hesitant to add in an ARB or ACE inhibitor as his blood pressure is low normal with a low diastolic pressure. We'll continue with his current medications. Will likely reassess his LV function next year.   Follow-up 6 months.   [name], MD, [facility] Attending Cardiologist [place] C Joanette Silveria 02/26/2017, 7:48 AM

## 2017-03-03 DIAGNOSIS — M545 Low back pain: Secondary | ICD-10-CM | POA: Diagnosis not present

## 2017-03-03 DIAGNOSIS — M5136 Other intervertebral disc degeneration, lumbar region: Secondary | ICD-10-CM | POA: Diagnosis not present

## 2017-03-03 DIAGNOSIS — I739 Peripheral vascular disease, unspecified: Secondary | ICD-10-CM | POA: Diagnosis not present

## 2017-03-04 ENCOUNTER — Other Ambulatory Visit: Payer: Self-pay | Admitting: Specialist

## 2017-03-04 DIAGNOSIS — I739 Peripheral vascular disease, unspecified: Secondary | ICD-10-CM

## 2017-03-15 ENCOUNTER — Ambulatory Visit
Admission: RE | Admit: 2017-03-15 | Discharge: 2017-03-15 | Disposition: A | Discharge status: Home / Self Care | Payer: Medicare Other | Source: Ambulatory Visit | Attending: Specialist | Admitting: Specialist

## 2017-03-15 DIAGNOSIS — I739 Peripheral vascular disease, unspecified: Secondary | ICD-10-CM

## 2017-03-15 DIAGNOSIS — M79605 Pain in left leg: Secondary | ICD-10-CM | POA: Diagnosis not present

## 2017-03-16 ENCOUNTER — Telehealth: Payer: Self-pay | Admitting: Internal Medicine

## 2017-03-16 NOTE — Telephone Encounter (Signed)
[name] [name] is calling because her father asked her to call. He had a ABI done at [place] and wants to let you know that the results was coming to Dr. Debara Pickett . Please call if you have any questions

## 2017-03-16 NOTE — Telephone Encounter (Signed)
Report in EPIC (ordered by Dr. Tonita Cong) Will route to Dr. Debara Pickett

## 2017-03-18 NOTE — Telephone Encounter (Signed)
I have reviewed the study - these are chronic blockages in his leg.  Unchanged compared to a study in 2014.  Dr. Lemmie Evens

## 2017-03-22 NOTE — Telephone Encounter (Signed)
Returned call to patient's daughter - explained Dr. Lysbeth Penner interpretation of findings. She asked "how bad are his legs". She states her dad had back pain too that they thought radiated to his legs but he was told that his issue was vascular and not r/t his nerves - was told by Dr. Tonita Cong (?) that blockages could be opened up. Daughter reports her dad's leg pain has increased over the years. She states her dad does not do well with med changes but may be open to potential interventions. Advised that Dr. Debara Pickett would have made a recommendations to see a PV specialists had his arterial blood flow been compromised to that extent but that I would seek clarification/further recommendations from him and call her and/or her dad (patient) back

## 2017-03-22 NOTE — Telephone Encounter (Signed)
Follow up   Daughter would like to speak to you about the test that Dr Tonita Cong did.  They would like to know what the next steps are?

## 2017-03-25 NOTE — Telephone Encounter (Signed)
Spoke with daughter [name]. She states her dad cannot walk more than 6ft without leg pain. Explained MD's interpretation of ABI study. Questions were raise, noted as follows:  Daughter states her father has said has hurt worse since his CABG - vein graft from his left leg. Patient is wondering if this residual pain could be related to this.   Patient/daughter would like any recommendations at all. They are r/o neuro, ortho, and now PAD/PVD as a cause of symptoms.

## 2017-03-25 NOTE — Telephone Encounter (Signed)
The doppler findings do not support any intervention to the legs to help with his pain.   Dr. Lemmie Evens

## 2017-03-29 NOTE — Telephone Encounter (Signed)
Go ahead and refer him to Dr. Gwenlyn Found or Dr. Fletcher Anon for another opinion, however, I do not think his PAD is the cause of his pain.  Dr. Lemmie Evens

## 2017-03-29 NOTE — Telephone Encounter (Signed)
Called patient and notified him of MD recommendations. He agrees w/plan to see [place] interventionalist. Scheduled w/Dr. Gwenlyn Found on 10/24

## 2017-04-09 DIAGNOSIS — E1165 Type 2 diabetes mellitus with hyperglycemia: Secondary | ICD-10-CM | POA: Diagnosis not present

## 2017-04-09 DIAGNOSIS — I5022 Chronic systolic (congestive) heart failure: Secondary | ICD-10-CM | POA: Diagnosis not present

## 2017-04-09 DIAGNOSIS — E1140 Type 2 diabetes mellitus with diabetic neuropathy, unspecified: Secondary | ICD-10-CM | POA: Diagnosis not present

## 2017-04-09 DIAGNOSIS — E782 Mixed hyperlipidemia: Secondary | ICD-10-CM | POA: Diagnosis not present

## 2017-04-09 DIAGNOSIS — G4733 Obstructive sleep apnea (adult) (pediatric): Secondary | ICD-10-CM | POA: Diagnosis not present

## 2017-04-09 DIAGNOSIS — I10 Essential (primary) hypertension: Secondary | ICD-10-CM | POA: Diagnosis not present

## 2017-04-09 DIAGNOSIS — E7800 Pure hypercholesterolemia, unspecified: Secondary | ICD-10-CM | POA: Diagnosis not present

## 2017-04-16 DIAGNOSIS — G4733 Obstructive sleep apnea (adult) (pediatric): Secondary | ICD-10-CM | POA: Diagnosis not present

## 2017-04-16 DIAGNOSIS — I5033 Acute on chronic diastolic (congestive) heart failure: Secondary | ICD-10-CM | POA: Diagnosis not present

## 2017-04-16 DIAGNOSIS — Z6832 Body mass index (BMI) 32.0-32.9, adult: Secondary | ICD-10-CM | POA: Diagnosis not present

## 2017-04-16 DIAGNOSIS — Z23 Encounter for immunization: Secondary | ICD-10-CM | POA: Diagnosis not present

## 2017-04-16 DIAGNOSIS — I10 Essential (primary) hypertension: Secondary | ICD-10-CM | POA: Diagnosis not present

## 2017-04-16 DIAGNOSIS — I5022 Chronic systolic (congestive) heart failure: Secondary | ICD-10-CM | POA: Diagnosis not present

## 2017-04-16 DIAGNOSIS — I255 Ischemic cardiomyopathy: Secondary | ICD-10-CM | POA: Diagnosis not present

## 2017-04-16 DIAGNOSIS — E1165 Type 2 diabetes mellitus with hyperglycemia: Secondary | ICD-10-CM | POA: Diagnosis not present

## 2017-04-21 ENCOUNTER — Encounter: Payer: Self-pay | Admitting: Cardiovascular Disease

## 2017-04-21 ENCOUNTER — Ambulatory Visit (INDEPENDENT_AMBULATORY_CARE_PROVIDER_SITE_OTHER): Payer: Medicare Other | Admitting: Cardiovascular Disease

## 2017-04-21 VITALS — BP 106/88 | Ht 74.0 in | Wt 256.0 lb

## 2017-04-21 DIAGNOSIS — I739 Peripheral vascular disease, unspecified: Secondary | ICD-10-CM | POA: Diagnosis not present

## 2017-04-21 NOTE — Patient Instructions (Signed)
Medication Instructions: Your physician recommends that you continue on your current medications as directed. Please refer to the Current Medication list given to you today.   Testing/Procedures: Your physician has requested that you have a lower extremity arterial duplex. During this test, ultrasound is used to evaluate arterial blood flow in the legs. Allow one hour for this exam. There are no restrictions or special instructions.  Your physician has requested that you have an ankle brachial index (ABI). During this test an ultrasound and blood pressure cuff are used to evaluate the arteries that supply the arms and legs with blood. Allow thirty minutes for this exam. There are no restrictions or special instructions.  Follow-Up: Your physician recommends that you schedule a follow-up appointment after testing with Dr. Gwenlyn Found.

## 2017-04-21 NOTE — Assessment & Plan Note (Signed)
[name] [name] was referred to me by Dr. Debara Pickett for evaluation of symptomatic PAD. He is a long history of ischemic cardiomyopathy and other problems have included diabetes and hyperlipidemia. He had bypass surgery in 2013. He is complaining of lifestyle limiting left lower stomach claudication for last 4 years and has gone progressively worse. Lower extremity Doppler studies performed 03/16/13 showed principally tibial vessel disease. Recent ABIs performed 03/15/17 revealed a right ABI 0.72 and a left ABI of 0.55. I'm going to get lower extremity Doppler studies to further evaluate.

## 2017-04-21 NOTE — Progress Notes (Signed)
04/21/2017 KEE DRUDGE   1946-05-22  [id number]  Primary Physician [name], Silvestre Moment, MD Primary Cardiologist: Lorretta Harp MD Lupe Carney, [place]  HPI:  [name] [name] is a 71 y.o. male widowed Caucasian male father of one daughter [name]), grandfather to 2 grandchildren who is referred by Dr. Debara Pickett, his cardiologist, for peripheral vascular evaluation. He has symptomatic left leg; claudication which she's had for the last 4 years and has become progressively worse and lifestyle limiting. He does have a history of coronary artery bypass grafting in 2013 with cardiac catheterization performed by Dr. Debara Pickett 09/13/15 revealing grafts. He does have moderate LV dysfunction. He has a history of diabetes. He had Dopplers performed 03/16/13 revealing tibial vessel disease typical of diabetic and segmental pressures performed 03/15/17 revealing a right ABI of 0.72 and a left ABI 0.55.   Current Meds  Medication Sig  . aspirin EC 81 MG tablet Take 81 mg by mouth daily.  . carvedilol (COREG) 6.25 MG tablet Take 1 tablet (6.25 mg total) by mouth 2 (two) times daily.  . Cholecalciferol (VITAMIN D-3 PO) Take 500 Units by mouth daily.  . furosemide (LASIX) 40 MG tablet TAKE ONE TABLET BY MOUTH ONCE DAILY  . gabapentin (NEURONTIN) 300 MG capsule Take 300 mg by mouth 2 (two) times daily.  Marland Kitchen glipiZIDE (GLUCOTROL XL) 10 MG 24 hr tablet Take 10 mg by mouth every morning.   . metFORMIN (GLUCOPHAGE-XR) 500 MG 24 hr tablet Take 1,000 mg by mouth 2 (two) times daily.  . Multiple Vitamin (MULTIVITAMIN) tablet Take 1 tablet by mouth daily.  . polycarbophil (FIBERCON) 625 MG tablet Take 625 mg by mouth as needed for mild constipation or moderate constipation.   . rosuvastatin (CRESTOR) 20 MG tablet Take 10 mg by mouth every evening.   Marland Kitchen spironolactone (ALDACTONE) 25 MG tablet TAKE ONE-HALF TABLET BY MOUTH ONCE DAILY  . tamsulosin (FLOMAX) 0.4 MG CAPS capsule Take 1 capsule by mouth every evening.   .  TRADJENTA 5 MG TABS tablet Take 5 mg by mouth daily.     Allergies  Allergen Reactions  . Entresto [Sacubitril-Valsartan] Other (See Comments)    dizziness    Social History   Social History  . Marital status: Widowed    Spouse name: N/A  . Number of children: 1  . Years of education: N/A   Occupational History  . real Investment banker, corporate    Social History Main Topics  . Smoking status: Former Smoker    Years: 5.00    Types: Cigars    Quit date: 09/07/2011  . Smokeless tobacco: Former Systems developer    Quit date: 02/28/2012  . Alcohol use 0.0 oz/week     Comment: occasional  . Drug use: No  . Sexual activity: Not on file   Other Topics Concern  . Not on file   Social History Narrative  . No narrative on file     Review of Systems: General: negative for chills, fever, night sweats or weight changes.  Cardiovascular: negative for chest pain, dyspnea on exertion, edema, orthopnea, palpitations, paroxysmal nocturnal dyspnea or shortness of breath Dermatological: negative for rash Respiratory: negative for cough or wheezing Urologic: negative for hematuria Abdominal: negative for nausea, vomiting, diarrhea, bright red blood per rectum, melena, or hematemesis Neurologic: negative for visual changes, syncope, or dizziness All other systems reviewed and are otherwise negative except as noted above.    Height 6\' 2"  (1.88 m), weight 256 lb (116.1 kg).  General appearance: alert and no distress Neck: no adenopathy, no carotid bruit, no JVD, supple, symmetrical, trachea midline and thyroid not enlarged, symmetric, no tenderness/mass/nodules Lungs: clear to auscultation bilaterally Heart: regular rate and rhythm, S1, S2 normal, no murmur, click, rub or gallop Extremities: extremities normal, atraumatic, no cyanosis or edema Pulses: Absent pedal pulses Skin: Skin color, texture, turgor normal. No rashes or lesions Neurologic: Alert and oriented X 3, normal strength and tone. Normal  symmetric reflexes. Normal coordination and gait  EKG not performed today  ASSESSMENT AND PLAN:   Peripheral arterial disease [facility]) Mr. Pettengill was referred to me by Dr. Debara Pickett for evaluation of symptomatic PAD. He is a long history of ischemic cardiomyopathy and other problems have included diabetes and hyperlipidemia. He had bypass surgery in 2013. He is complaining of lifestyle limiting left lower stomach claudication for last 4 years and has gone progressively worse. Lower extremity Doppler studies performed 03/16/13 showed principally tibial vessel disease. Recent ABIs performed 03/15/17 revealed a right ABI 0.72 and a left ABI of 0.55. I'm going to get lower extremity Doppler studies to further evaluate.      Lorretta Harp MD FACP,FACC,FAHA, [facility] 04/21/2017 2:29 PM

## 2017-05-05 ENCOUNTER — Other Ambulatory Visit: Payer: Self-pay | Admitting: Internal Medicine

## 2017-05-06 NOTE — Telephone Encounter (Signed)
Rx(s) sent to pharmacy electronically.  

## 2017-05-07 ENCOUNTER — Ambulatory Visit (HOSPITAL_COMMUNITY)
Admission: RE | Admit: 2017-05-07 | Discharge: 2017-05-07 | Disposition: A | Discharge status: Home / Self Care | Payer: Medicare Other | Source: Ambulatory Visit | Attending: Cardiovascular Disease | Admitting: Cardiovascular Disease

## 2017-05-07 DIAGNOSIS — I739 Peripheral vascular disease, unspecified: Secondary | ICD-10-CM | POA: Diagnosis not present

## 2017-05-07 DIAGNOSIS — R9439 Abnormal result of other cardiovascular function study: Secondary | ICD-10-CM | POA: Insufficient documentation

## 2017-05-12 ENCOUNTER — Encounter: Payer: Self-pay | Admitting: Cardiovascular Disease

## 2017-05-12 ENCOUNTER — Ambulatory Visit (INDEPENDENT_AMBULATORY_CARE_PROVIDER_SITE_OTHER): Payer: Medicare Other | Admitting: Cardiovascular Disease

## 2017-05-12 VITALS — BP 127/65 | HR 80 | Ht 74.0 in | Wt 256.2 lb

## 2017-05-12 DIAGNOSIS — I255 Ischemic cardiomyopathy: Secondary | ICD-10-CM | POA: Diagnosis not present

## 2017-05-12 DIAGNOSIS — I739 Peripheral vascular disease, unspecified: Secondary | ICD-10-CM | POA: Diagnosis not present

## 2017-05-12 NOTE — Patient Instructions (Signed)
Medication Instructions: Your physician recommends that you continue on your current medications as directed. Please refer to the Current Medication list given to you today.   Testing/Procedures: Your physician has requested that you have a aorta and iliac duplex. During this test, an ultrasound is used to evaluate blood flow to the aorta and iliac arteries. Allow one hour for this exam. Do not eat after midnight the day before and avoid carbonated beverages.   Follow-Up: Your physician recommends that you schedule a follow-up appointment as needed with Dr. Gwenlyn Found.

## 2017-05-12 NOTE — Progress Notes (Signed)
[name] [name] returns today for follow-up of his lower extremity arterial Doppler studies. This basically reveals tibial vessel disease although his left ABI is much lower than his right. He complains of pain in his left foot heel calf rating up to his thigh hip and back. His femoral artery waveforms are triphasic. I am going to get aortoiliac Doppler's to definitively rule out an iliac lesion although I think this is unlikely. I suspect that his symptoms are radicular in nature rather than vascular. I will see him back as needed.   Lorretta Harp, M.D., [place], [facility], Laverta [place] [address]. [place], [place]  [address]  [phone] 05/12/2017 4:24 PM

## 2017-05-12 NOTE — Assessment & Plan Note (Signed)
Mr. Bentsen returns today for follow-up of his lower extremity arterial Doppler studies. This basically reveals tibial vessel disease although his left ABI is much lower than his right. He complains of pain in his left foot heel calf rating up to his thigh hip and back. His femoral artery waveforms are triphasic. I am going to get aortoiliac Doppler's to definitively rule out an iliac lesion although I think this is unlikely. I suspect that his symptoms are radicular in nature rather than vascular. I will see him back as needed.

## 2017-05-18 DIAGNOSIS — J0100 Acute maxillary sinusitis, unspecified: Secondary | ICD-10-CM | POA: Diagnosis not present

## 2017-05-18 DIAGNOSIS — Z6832 Body mass index (BMI) 32.0-32.9, adult: Secondary | ICD-10-CM | POA: Diagnosis not present

## 2017-05-18 DIAGNOSIS — J309 Allergic rhinitis, unspecified: Secondary | ICD-10-CM | POA: Diagnosis not present

## 2017-06-03 ENCOUNTER — Ambulatory Visit (HOSPITAL_COMMUNITY)
Admission: RE | Admit: 2017-06-03 | Discharge: 2017-06-03 | Disposition: A | Discharge status: Home / Self Care | Payer: Medicare Other | Source: Ambulatory Visit | Attending: Cardiology | Admitting: Cardiology

## 2017-06-03 ENCOUNTER — Encounter (HOSPITAL_COMMUNITY): Payer: Medicare Other

## 2017-06-03 DIAGNOSIS — I2510 Atherosclerotic heart disease of native coronary artery without angina pectoris: Secondary | ICD-10-CM | POA: Insufficient documentation

## 2017-06-03 DIAGNOSIS — Z6832 Body mass index (BMI) 32.0-32.9, adult: Secondary | ICD-10-CM | POA: Insufficient documentation

## 2017-06-03 DIAGNOSIS — I739 Peripheral vascular disease, unspecified: Secondary | ICD-10-CM | POA: Diagnosis not present

## 2017-06-03 DIAGNOSIS — Z87891 Personal history of nicotine dependence: Secondary | ICD-10-CM | POA: Diagnosis not present

## 2017-06-03 DIAGNOSIS — E669 Obesity, unspecified: Secondary | ICD-10-CM | POA: Diagnosis not present

## 2017-06-03 DIAGNOSIS — I10 Essential (primary) hypertension: Secondary | ICD-10-CM | POA: Insufficient documentation

## 2017-06-03 DIAGNOSIS — E1151 Type 2 diabetes mellitus with diabetic peripheral angiopathy without gangrene: Secondary | ICD-10-CM | POA: Diagnosis not present

## 2017-06-11 ENCOUNTER — Telehealth: Payer: Self-pay | Admitting: Cardiovascular Disease

## 2017-06-11 NOTE — Telephone Encounter (Signed)
He'll get a referral to a neurologist from his PCP.

## 2017-06-11 NOTE — Telephone Encounter (Signed)
Results given to pt. Pt verbalized understanding. Pt would like to know if Dr. Gwenlyn Found will refer him to neurology.  Please advise.  Thanks!

## 2017-06-11 NOTE — Telephone Encounter (Signed)
New message  Pt verbalized that he is returning call for the rn   For test results on 06/03/2017

## 2017-06-14 NOTE — Telephone Encounter (Signed)
F/u message  Pt daughter call back to ask that pt receive a call back on his mobile number with the recommendations for the neurologist. Please call back to discuss

## 2017-06-14 NOTE — Telephone Encounter (Signed)
Left detail message with recommendations, ok per DPR, and to call back if any questions.  

## 2017-06-14 NOTE — Telephone Encounter (Signed)
Forward to [employer]

## 2017-06-15 NOTE — Telephone Encounter (Signed)
Follow up   Patient daughter [name] [name] is calling back to speak with nurse. Patient daughter is aware of Dr. Gwenlyn Found notes.  [name] [name] is requesting that the call be returned to the father [name] [name].

## 2017-06-28 NOTE — Telephone Encounter (Signed)
lmtcb

## 2017-08-05 ENCOUNTER — Telehealth: Payer: Self-pay | Admitting: Cardiovascular Disease

## 2017-08-05 NOTE — Telephone Encounter (Signed)
New message   Patients daughter is calling because Gwenlyn Found recommended her dad go to a [facility] but they cant remember the name of the Doctor he referred for them,  Please call

## 2017-08-09 ENCOUNTER — Encounter: Payer: Self-pay | Admitting: Internal Medicine

## 2017-08-09 ENCOUNTER — Ambulatory Visit (INDEPENDENT_AMBULATORY_CARE_PROVIDER_SITE_OTHER): Payer: Medicare Other | Admitting: Internal Medicine

## 2017-08-09 VITALS — BP 141/72 | HR 74 | Ht 74.0 in | Wt 253.0 lb

## 2017-08-09 DIAGNOSIS — I255 Ischemic cardiomyopathy: Secondary | ICD-10-CM | POA: Diagnosis not present

## 2017-08-09 DIAGNOSIS — I4892 Unspecified atrial flutter: Secondary | ICD-10-CM | POA: Diagnosis not present

## 2017-08-09 DIAGNOSIS — I2589 Other forms of chronic ischemic heart disease: Secondary | ICD-10-CM

## 2017-08-09 DIAGNOSIS — R9431 Abnormal electrocardiogram [ECG] [EKG]: Secondary | ICD-10-CM | POA: Diagnosis not present

## 2017-08-09 NOTE — Patient Instructions (Addendum)
Your physician has requested that you have an echocardiogram (ASAP) @ [address] - [address]. Echocardiography is a painless test that uses sound waves to create images of your heart. It provides your doctor with information about the size and shape of your heart and how well your heart's chambers and valves are working. This procedure takes approximately one hour. There are no restrictions for this procedure.   Your physician has recommended that you wear a holter monitor (ASAP) for 48 hours @ [address] - [address]. Holter monitors are medical devices that record the heart's electrical activity. Doctors most often use these monitors to diagnose arrhythmias. Arrhythmias are problems with the speed or rhythm of the heartbeat. The monitor is a small, portable device. You can wear one while you do your normal daily activities. This is usually used to diagnose what is causing palpitations/syncope (passing out).   Your physician recommends that you schedule a follow-up appointment with Dr. Debara Pickett after your testing.

## 2017-08-10 ENCOUNTER — Encounter: Payer: Self-pay | Admitting: Internal Medicine

## 2017-08-10 DIAGNOSIS — R9431 Abnormal electrocardiogram [ECG] [EKG]: Secondary | ICD-10-CM | POA: Insufficient documentation

## 2017-08-10 NOTE — Progress Notes (Signed)
OFFICE NOTE  Chief Complaint:  Routine follow-up  Primary Care Physician: [name] [name], MD  HPI:  [name] [name] is a pleasant 72 year old gentleman previously followed by Dr. Rollene Fare. His past medical history is significant for coronary artery disease. In 2013 he underwent multivessel CABG for an ischemic cardiomyopathy. EF was 20-25% prior to surgery however post surgery his EF had improved up to 45-50%. Recently his EF was 40-45% by echo in May of 2014. There was mild mitral regurgitation, mild to moderately dilated left atrium and mild LVH. [name] [name] has been describing some anxiety. He also reports some pain in his legs however underwent Dopplers in September 2014 which showed preserved ABIs bilaterally a 1.0 on the right and 1.1 on the left. The bilateral peroneal and posterior tibial arteries were occluded. He reports some optimal control of his diabetes. His A1c was 7.2. He is concerned about the cost of taking Tradjenta. He is followed by Dr. Elyse Hsu.   He was recently seen in the emergency room and he can hospital. There he presented with hypotension and was given 1 L normal saline and his symptoms improved. He had been having dizziness as well as some upper back pain into the left shoulder blade with exertion recently. The symptoms are worse particularly when climbing up ladders or going up stairs and are relieved at rest. After that hospitalization it was recommended that he discontinue his ACE inhibitor and beta blocker, and his blood pressure appears improved today up to 110/60.  [name] [name] returns today for followup of his stress test. This is interpreted as nonischemic with an EF of 44%. He reports still feeling very fatigued and extremely short of breath when doing activities. He says that the other day he tried to hit golf balls and felt that he was totally exhausted after just an hour. He reports his blood pressure has improved somewhat off of all medications however  remains low. He has had symptoms of orthostatic hypotension in the past. His symptoms do feel similar to prior to his bypass surgery. He also feels that he was much better after surgery than he is now and that his symptoms have been going on for the past 2 months.  At his last office visit, I recommended that [name] [name] have a repeat cardiac catheterization (08/2013). This demonstrated the following:  Impression:  1. 2 vessel native CAD with occluded LAD in the mid-vessel  2. Patent LIMA-LAD, SVG to PLA and SVG to PDA grafts with TIMI III flow  3. LVEDP = 13 mmHg  4. Random PVC's were noted  Based on these findings, I could not find any new cardiac cause of his symptoms. I suspect that some of his fatigue could be related to labile blood sugars. He says unexplained hypotension but is normotensive off of medications.  [name] [name] returns today for followup. He now reports feeling better since he had change in his medications. He is established with Dr. Buddy Duty who adjusted his diabetes medicines and stopped his Invokana. His shortness of breath and fatigue in both improved. He is now been expected some problems with left heel pain. He was found to have some spinal stenosis but has had improvement with inserts in his shoes.  I saw [name] [name] back in the office today. He has successfully underwent back surgery earlier this year which she says initially was much improved, however he says he subsequently developed more pain going down his legs. This sounds like it's  neuropathic pain, but is reluctant to try medication for it. He also significantly fatigue. This could be due to a number of etiologies. 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 Also, he is noted to have a history of obstructive sleep apnea. He was placed on BiPAP, but has not been compliant with that due to difficulty wearing the mask. He also  never had follow-up of his sleep study.  [name] [name] returns today for follow-up. He reports he's had worsening dyspnea and fatigue. He says that he can hardly sleep at night. He is struggling with significant anxiety. 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 He's not been wearing the BiPAP due to significant anxiety and difficulty sleeping. He feels like he gets smothered with his machine. Communication between Dr. Radford Pax and Dr. Quintin Alto led to the starting of Celexa as well as Klonopin to use as needed at night. He says that it does give him about 6 hours of sleep. He has not been using his BiPAP as instructed. He is reportedly had more worsening shortness of breath. He was seen in the ER for this and it was thought this was due [email or web address] with BiPAP. His BNP however is elevated over 300. Today in the office he says that he's had more swelling and more abdominal fullness as well as leg edema.  I had the pleasure seeing [name] [name] back today in the office. He seems to respond nicely to Lasix. I started him on 40 mg daily and while I was out of town my partner reviewed his lab work which included an elevated BNP over 300. He increased his Lasix to 40 mg twice a day. [name] [name] says he's had a significant improvement in his breathing. The BNP now is in the mid 200s. Renal function is stable based on labs today. Unfortunately, his echocardiogram does show a new cardiomyopathy with EF 30-35%. It should be noted that he felt "awful" on blood pressure/heart failure medicines and had taken himself off of ACE inhibitor and beta blocker in the past. The echo however does suggest inferior and anterolateral wall motion abnormalities which are new and could indicate graft dysfunction. I performed his last heart catheterization in 2015 which showed all 3 grafts patent.  [name] [name] returns today for follow-up of his left heart catheterization. I perform  this a few weeks ago and he had patent bypass grafts however LV function is reduced. Cardiac output is also reduced. Filling pressures were mildly elevated. He reports since discharge that he's had some improvement of his symptoms. He was started on low-dose beta blocker but he does report fatigue. When I asked him how they compared to previously he said he thinks attack sleep better since he started on the beta blocker but not back to what he thinks his baseline should be. He continues to have problems with left leg pain. He had ultrasound of the left leg which were unrevealing. He also had nerve conduction studies which I sent him for which were not suggestive of neuropathy. His symptoms were worse while lying flat on the Cath Lab table and I suspect this could be again coming from his back and he may need repeat orthopedic evaluation. His primary care provider started him on Celexa recently as well for anxiety.  12/23/2015  [name] [name] was seen back in the office  today in follow-up. Overall he seems to be doing well. He's tolerating low-dose carvedilol and has had some very infrequent morning dizziness. He was previously on Lasix 40 mg twice a day but ran out of the medicine about 8 days ago. He's not had any worsening weight gain or swelling nor worsening shortness of breath over the past week. This could be that he's had some improvement in his cardiomyopathy. Nevertheless, I would like him to be on some Lasix at least until we can determine whether or not he is euvolemic with lab work.  05/27/2016  [name] [name] returns today for follow-up. He's gained about 6 pounds of weight since I last saw him. He denies any worsening shortness of breath or orthostatic dizziness. He saw Dr. Radford Pax in August for following of his obstructive sleep apnea. He remains physically active and denies any chest pain. He recently had a repeat echo in October 2017 which showed a small improvement in LV function with EF up to  35-40%. Heart failure regimen includes aspirin, carvedilol, Crestor, and spironolactone. He is not currently on a ARB is he's had orthostatic symptoms in the past although that his generally resolved. His creatinine is normal.  08/24/2016  I saw [name] [name] today in follow-up. He again gained about 4 pounds over the past month. He felt that the weight gain was heart failure related because he got more short of breath particularly walking up stairs. Based on that he increased his Lasix back to 40 mg daily. He notes that his urine is been darker and his urination is not necessarily improved. He's also been more dizzy. He says he gets somewhat presyncopal with change in position. Lab work 12 days ago indicated a stable creatinine however. Despite this, I feel that he may be somewhat presyncopal perhaps on too high of a dose of diuretics. He is also on Entresto 24/26 and Aldactone 12.5 mg daily. He also complained of some pain across the back between the shoulder blades. He said this got better with resting and drinking water. It's difficult to say whether that it was the water or perhaps resting from his activities. I cannot exclude that this could be angina. His EKG however is reassuring today.  09/21/2016  [name] [name] was seen today in follow-up. I decreased his Lasix, however he does not feel any change in his dizziness. There is no evidence of presyncope. He has gained about 4 pounds which I suspect is some fluid retention. Blood pressure is well-controlled today 118/56.  02/26/2017  [name] [name] returns today for follow-up. He had more recent dizziness and it seemed to worsen on Entresto. He discontinued that and feels that he's now much better. Overall he says he feels well. He is not on ACE inhibitor or ARB but remains on carvedilol. Is also on Lasix 40 mg daily. Blood pressure is stable 122/52.  08/10/2017  [name] [name] returns for follow-up.  Overall he seems to be feeling very well.  Denies any chest  pain or worsening shortness of breath.  He gets occasional dizziness but that is much improved.  Of note an EKG was performed again today and there is a suggestion that there may be underlying atrial flutter.  Heart rate was 74 and EKG looks very similar to his prior EKG.  The computer has interpreted a sinus rhythm with first-degree AV block and bifascicular block.  I compared EKG to his previous EKG last August which looks similar however it is distinctly different from the EKG  last of February.  He is asymptomatic, but not anticoagulated.  Otherwise, labs reviewed from October indicate total cholesterol 134, HDL 36 LDL 67 and triglycerides 157.  Hemoglobin A1c of 7.6 and serum creatinine 0.9.  PMHx:  Past Medical History:  Diagnosis Date  . Arthritis    Knee L - probably  . BPH (benign prostatic hyperplasia)   . CAD (coronary artery disease) 07/06/2011  . CHF (congestive heart failure) ([place])   . Coronary artery disease   . Diabetes mellitus   . Frequency of urination   . High cholesterol   . History of nuclear stress test 09/2010   dipyridamole; mild perfusion defect due to attenuation with mild superimposed ischemia in apical septal, apical, apical inferior & apical lateral regions; rest LV enlarged in size; prominent gut uptake in infero-apical region; no significant ischemia demonstrated; low risk scan   . HNP (herniated nucleus pulposus), lumbar   . Ischemic cardiomyopathy   . LV dysfunction 07/06/2011  . Nocturia   . Right bundle branch block   . 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 Ellison Hughs  . Sleep apnea    sleep study 10/2010- AHI during total sleep 32.1/hr and during REM 62.3/hr (severe sleep apnea)unable to tolerate c pap  . Spinal stenosis of lumbar region   . Stroke [facility])    [place]     Past Surgical History:  Procedure Laterality Date  . CARDIAC CATHETERIZATION  01/2011   ischemic  cardiomyopathy 30-35%, multivessel CAD (Dr. [name])   . CARDIAC CATHETERIZATION  09/13/2015   Procedure: Right/Left Heart Cath and Coronary/Graft Angiography;  Surgeon: [name], MD;  Location: [facility];  Service: Cardiovascular;;  . Colonscopy    . CORONARY ARTERY BYPASS GRAFT  08/03/2011   Procedure: CORONARY ARTERY BYPASS GRAFTING (CABG);  Surgeon: [name], MD;  Location: [facility];  Service: Open Heart Surgery;  Laterality: N/A;  CABG times three using right saphenous vein and left mammary artery usisng endoscope.  Marland Kitchen LEFT HEART CATHETERIZATION WITH CORONARY/GRAFT ANGIOGRAM N/A 09/20/2013   Procedure: LEFT HEART CATHETERIZATION WITH [name];  Surgeon: [name], MD;  Location: [facility] C[facility];  Service: Cardiovascular;  Laterality: N/A;  . Lower Extremity Arterial Doppler  03/16/2013   bilat ABIs demonstated normal values; R runoff - posterior tibial & anterior tibial arteries occluded; L runoff - peroneal & posterior tibial arteries occluded, anterior tibial artery appears occluded  . LUMBAR LAMINECTOMY/DECOMPRESSION MICRODISCECTOMY N/A 09/12/2014   Procedure: LUMBAR DECOMPRESSION L3-L4, L4-L5, MICRODISCECTOMY L4-L5;  Surgeon: [name], MD;  Location: [facility];  Service: Orthopedics;  Laterality: N/A;  . Pilonidal Cyst removed    . TRANSTHORACIC ECHOCARDIOGRAM  11/10/2012   EF 40-45%, mild LVH, mild hypokinesis of anteroseptal myocardium, grade 1 diastolic dysfunction; mild MR & calcifed mitral annulus; LA mild-mod dilated; RA mildly dilated    FAMHx:  Family History  Problem Relation Age of Onset  . Anesthesia problems Daughter   . Cancer Mother   . Lung disease Father        & heart disease  . Colon cancer Sister   . Sudden death Maternal Grandmother     SOCHx:   reports that he quit smoking about 5 years ago. His smoking use included cigars. He quit after 5.00 years of use. He quit smokeless tobacco use about 5 years ago. He reports that he  drinks alcohol.  He reports that he does not use drugs.  ALLERGIES:  Allergies  Allergen Reactions  . Entresto [Sacubitril-Valsartan] Other (See Comments)    dizziness    ROS: Pertinent items noted in HPI and remainder of comprehensive ROS otherwise negative.  HOME MEDS: Current Outpatient Medications  Medication Sig Dispense Refill  . aspirin EC 81 MG tablet Take 81 mg by mouth daily.    . carvedilol (COREG) 6.25 MG tablet Take 1 tablet (6.25 mg total) by mouth 2 (two) times daily. 180 tablet 2  . Cholecalciferol (VITAMIN D-3 PO) Take 500 Units by mouth daily.    . furosemide (LASIX) 40 MG tablet TAKE ONE TABLET BY MOUTH ONCE DAILY 30 tablet 9  . gabapentin (NEURONTIN) 300 MG capsule Take 300 mg by mouth 2 (two) times daily.    Marland Kitchen glipiZIDE (GLUCOTROL XL) 10 MG 24 hr tablet Take 10 mg by mouth every morning.     . metFORMIN (GLUCOPHAGE-XR) 500 MG 24 hr tablet Take 1,000 mg by mouth 2 (two) times daily.    . Multiple Vitamin (MULTIVITAMIN) tablet Take 1 tablet by mouth daily.    . polycarbophil (FIBERCON) 625 MG tablet Take 625 mg by mouth as needed for mild constipation or moderate constipation.     . rosuvastatin (CRESTOR) 20 MG tablet Take 10 mg by mouth every evening.     Marland Kitchen spironolactone (ALDACTONE) 25 MG tablet TAKE 1/2 (ONE-HALF) TABLET BY MOUTH ONCE DAILY 45 tablet 2  . tamsulosin (FLOMAX) 0.4 MG CAPS capsule Take 1 capsule by mouth every evening.     . TRADJENTA 5 MG TABS tablet Take 5 mg by mouth daily.     No current facility-administered medications for this visit.     LABS/IMAGING: No results found for this or any previous visit (from the past 48 hour(s)). No results found.  VITALS: BP (!) 141/72   Pulse 74   Ht 6\' 2"  (1.88 m)   Wt 253 lb (114.8 kg)   BMI 32.48 kg/m   EXAM: General appearance: alert and no distress Neck: no carotid bruit and no JVD Lungs: clear to auscultation bilaterally Heart: regular rate and rhythm, S1, S2 normal, no murmur, click, rub  or gallop Abdomen: soft, non-tender; bowel sounds normal; no masses,  no organomegaly Extremities: extremities normal, atraumatic, no cyanosis or edema Pulses: 2+ and symmetric 2+ bilateral DP, 2+ L PT, 1+ R PT Skin: Skin color, texture, turgor normal. No rashes or lesions Neurologic: Grossly normal Psych: Pleasant  EKG: Possible atrial flutter with 4-1 conduction at 74, bifascicular block, minimal voltage criteria for LVH-personally reviewed  ASSESSMENT: 1. Possible atrial flutter? 2. Chronic systolic congestive heart failure - EF 35-40% (03/2016) 3. Coronary artery disease status post three-vessel CABG in 2013 (LIMA to LAD, SVG to PDA and SVG to PLA) - patent grafts by cath in 08/2015 4. Hypertension-controlled 5. Dyslipidemia 6. Diabetes type 2 - better controlled 7. Mild PVD-with normal ABI 8. Hypotension/upper back pain - resolved 9. 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 OSA-back on BiPAP  PLAN: 1.   [name] [name] has abnormalities on his EKG suggesting possible atrial flutter.  In some leads is more convincing than others.  He is asymptomatic.  I like to place 48-hour monitor to see if we can note if his heart rate has remained stable.  The findings do seem similar to his prior EKG which was interpreted as sinus rhythm, however, when comparing it to an earlier EKG about a year ago the  rate was lower in the 50s and there appeared to be less of the typical sawtooth atrial pattern.  I reviewed the EKG with Dr. Percival Spanish, who felt that this initially may be atrial flutter but then thought it was more suggestive of sinus rhythm.  I will also obtain an echocardiogram to evaluate his atrium which will be more definitive in determining if he is in atrial flutter.  If so, he will need to be anticoagulated.  Follow-up in 1 month.   [name], MD, [facility], [place] Director of the Advanced Lipid Disorders &  Cardiovascular Risk  Reduction Clinic Diplomate of the American Board of Clinical Lipidology Attending Cardiologist  Direct Dial: [phone]  Fax: [phone]  Website:  [email or web address] 08/10/2017, 9:15 AM

## 2017-08-12 DIAGNOSIS — I10 Essential (primary) hypertension: Secondary | ICD-10-CM | POA: Diagnosis not present

## 2017-08-12 DIAGNOSIS — I255 Ischemic cardiomyopathy: Secondary | ICD-10-CM | POA: Diagnosis not present

## 2017-08-12 DIAGNOSIS — E7800 Pure hypercholesterolemia, unspecified: Secondary | ICD-10-CM | POA: Diagnosis not present

## 2017-08-12 DIAGNOSIS — E1165 Type 2 diabetes mellitus with hyperglycemia: Secondary | ICD-10-CM | POA: Diagnosis not present

## 2017-08-12 DIAGNOSIS — G4733 Obstructive sleep apnea (adult) (pediatric): Secondary | ICD-10-CM | POA: Diagnosis not present

## 2017-08-12 DIAGNOSIS — E782 Mixed hyperlipidemia: Secondary | ICD-10-CM | POA: Diagnosis not present

## 2017-08-12 DIAGNOSIS — E1140 Type 2 diabetes mellitus with diabetic neuropathy, unspecified: Secondary | ICD-10-CM | POA: Diagnosis not present

## 2017-08-13 ENCOUNTER — Ambulatory Visit (HOSPITAL_COMMUNITY): Payer: Medicare Other | Attending: Cardiology

## 2017-08-13 ENCOUNTER — Other Ambulatory Visit: Payer: Self-pay

## 2017-08-13 DIAGNOSIS — E119 Type 2 diabetes mellitus without complications: Secondary | ICD-10-CM | POA: Diagnosis not present

## 2017-08-13 DIAGNOSIS — I509 Heart failure, unspecified: Secondary | ICD-10-CM | POA: Diagnosis not present

## 2017-08-13 DIAGNOSIS — I4892 Unspecified atrial flutter: Secondary | ICD-10-CM | POA: Diagnosis not present

## 2017-08-13 DIAGNOSIS — R9431 Abnormal electrocardiogram [ECG] [EKG]: Secondary | ICD-10-CM | POA: Diagnosis not present

## 2017-08-13 DIAGNOSIS — E785 Hyperlipidemia, unspecified: Secondary | ICD-10-CM | POA: Insufficient documentation

## 2017-08-13 MED ORDER — PERFLUTREN LIPID MICROSPHERE
1.0000 mL | INTRAVENOUS | Status: AC | PRN
  Administered 2017-08-13: 2 mL via INTRAVENOUS

## 2017-08-16 DIAGNOSIS — E1165 Type 2 diabetes mellitus with hyperglycemia: Secondary | ICD-10-CM | POA: Diagnosis not present

## 2017-08-16 DIAGNOSIS — K5901 Slow transit constipation: Secondary | ICD-10-CM | POA: Diagnosis not present

## 2017-08-16 DIAGNOSIS — N401 Enlarged prostate with lower urinary tract symptoms: Secondary | ICD-10-CM | POA: Diagnosis not present

## 2017-08-16 DIAGNOSIS — Z6832 Body mass index (BMI) 32.0-32.9, adult: Secondary | ICD-10-CM | POA: Diagnosis not present

## 2017-08-16 DIAGNOSIS — I5022 Chronic systolic (congestive) heart failure: Secondary | ICD-10-CM | POA: Diagnosis not present

## 2017-08-16 DIAGNOSIS — I255 Ischemic cardiomyopathy: Secondary | ICD-10-CM | POA: Diagnosis not present

## 2017-08-16 DIAGNOSIS — I5033 Acute on chronic diastolic (congestive) heart failure: Secondary | ICD-10-CM | POA: Diagnosis not present

## 2017-08-16 DIAGNOSIS — I10 Essential (primary) hypertension: Secondary | ICD-10-CM | POA: Diagnosis not present

## 2017-08-17 ENCOUNTER — Ambulatory Visit (INDEPENDENT_AMBULATORY_CARE_PROVIDER_SITE_OTHER): Payer: Medicare Other

## 2017-08-17 DIAGNOSIS — R9431 Abnormal electrocardiogram [ECG] [EKG]: Secondary | ICD-10-CM | POA: Diagnosis not present

## 2017-08-17 DIAGNOSIS — I255 Ischemic cardiomyopathy: Secondary | ICD-10-CM | POA: Diagnosis not present

## 2017-08-17 DIAGNOSIS — I4892 Unspecified atrial flutter: Secondary | ICD-10-CM | POA: Diagnosis not present

## 2017-08-30 ENCOUNTER — Encounter: Payer: Self-pay | Admitting: Internal Medicine

## 2017-08-30 ENCOUNTER — Ambulatory Visit (INDEPENDENT_AMBULATORY_CARE_PROVIDER_SITE_OTHER): Payer: Medicare Other | Admitting: Internal Medicine

## 2017-08-30 VITALS — BP 132/66 | HR 75 | Ht 74.0 in | Wt 252.0 lb

## 2017-08-30 DIAGNOSIS — Z951 Presence of aortocoronary bypass graft: Secondary | ICD-10-CM | POA: Diagnosis not present

## 2017-08-30 DIAGNOSIS — M48062 Spinal stenosis, lumbar region with neurogenic claudication: Secondary | ICD-10-CM

## 2017-08-30 DIAGNOSIS — I10 Essential (primary) hypertension: Secondary | ICD-10-CM

## 2017-08-30 DIAGNOSIS — I255 Ischemic cardiomyopathy: Secondary | ICD-10-CM

## 2017-08-30 NOTE — Patient Instructions (Addendum)
Your physician wants you to follow-up in: 6 months with Dr. Debara Pickett. You will receive a reminder letter in the mail two months in advance. If you don't receive a letter, please call our office to schedule the follow-up appointment.  Dr. Debara Pickett has referred you to Dr. Kary Kos

## 2017-08-30 NOTE — Progress Notes (Signed)
OFFICE NOTE  Chief Complaint:  Follow-up studies  Primary Care Physician: [name] [name], MD  HPI:  [name] [name] is a pleasant 72 year old gentleman previously followed by Dr. Rollene Fare. His past medical history is significant for coronary artery disease. In 2013 he underwent multivessel CABG for an ischemic cardiomyopathy. EF was 20-25% prior to surgery however post surgery his EF had improved up to 45-50%. Recently his EF was 40-45% by echo in May of 2014. There was mild mitral regurgitation, mild to moderately dilated left atrium and mild LVH. [name] [name] has been describing some anxiety. He also reports some pain in his legs however underwent Dopplers in September 2014 which showed preserved ABIs bilaterally a 1.0 on the right and 1.1 on the left. The bilateral peroneal and posterior tibial arteries were occluded. He reports some optimal control of his diabetes. His A1c was 7.2. He is concerned about the cost of taking Tradjenta. He is followed by Dr. Elyse Hsu.   He was recently seen in the emergency room and he can hospital. There he presented with hypotension and was given 1 L normal saline and his symptoms improved. He had been having dizziness as well as some upper back pain into the left shoulder blade with exertion recently. The symptoms are worse particularly when climbing up ladders or going up stairs and are relieved at rest. After that hospitalization it was recommended that he discontinue his ACE inhibitor and beta blocker, and his blood pressure appears improved today up to 110/60.  [name] [name] returns today for followup of his stress test. This is interpreted as nonischemic with an EF of 44%. He reports still feeling very fatigued and extremely short of breath when doing activities. He says that the other day he tried to hit golf balls and felt that he was totally exhausted after just an hour. He reports his blood pressure has improved somewhat off of all medications however  remains low. He has had symptoms of orthostatic hypotension in the past. His symptoms do feel similar to prior to his bypass surgery. He also feels that he was much better after surgery than he is now and that his symptoms have been going on for the past 2 months.  At his last office visit, I recommended that [name] [name] have a repeat cardiac catheterization (08/2013). This demonstrated the following:  Impression:  1. 2 vessel native CAD with occluded LAD in the mid-vessel  2. Patent LIMA-LAD, SVG to PLA and SVG to PDA grafts with TIMI III flow  3. LVEDP = 13 mmHg  4. Random PVC's were noted  Based on these findings, I could not find any new cardiac cause of his symptoms. I suspect that some of his fatigue could be related to labile blood sugars. He says unexplained hypotension but is normotensive off of medications.  [name] [name] returns today for followup. He now reports feeling better since he had change in his medications. He is established with Dr. Buddy Duty who adjusted his diabetes medicines and stopped his Invokana. His shortness of breath and fatigue in both improved. He is now been expected some problems with left heel pain. He was found to have some spinal stenosis but has had improvement with inserts in his shoes.  I saw [name] [name] back in the office today. He has successfully underwent back surgery earlier this year which she says initially was much improved, however he says he subsequently developed more pain going down his legs. This sounds like it's  neuropathic pain, but is reluctant to try medication for it. He also significantly fatigue. This could be due to a number of etiologies. 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 Also, he is noted to have a history of obstructive sleep apnea. He was placed on BiPAP, but has not been compliant with that due to difficulty wearing the mask. He also  never had follow-up of his sleep study.  [name] [name] returns today for follow-up. He reports he's had worsening dyspnea and fatigue. He says that he can hardly sleep at night. He is struggling with significant anxiety. 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 He's not been wearing the BiPAP due to significant anxiety and difficulty sleeping. He feels like he gets smothered with his machine. Communication between Dr. Radford Pax and Dr. Quintin Alto led to the starting of Celexa as well as Klonopin to use as needed at night. He says that it does give him about 6 hours of sleep. He has not been using his BiPAP as instructed. He is reportedly had more worsening shortness of breath. He was seen in the ER for this and it was thought this was due [email or web address] with BiPAP. His BNP however is elevated over 300. Today in the office he says that he's had more swelling and more abdominal fullness as well as leg edema.  I had the pleasure seeing [name] [name] back today in the office. He seems to respond nicely to Lasix. I started him on 40 mg daily and while I was out of town my partner reviewed his lab work which included an elevated BNP over 300. He increased his Lasix to 40 mg twice a day. [name] [name] says he's had a significant improvement in his breathing. The BNP now is in the mid 200s. Renal function is stable based on labs today. Unfortunately, his echocardiogram does show a new cardiomyopathy with EF 30-35%. It should be noted that he felt "awful" on blood pressure/heart failure medicines and had taken himself off of ACE inhibitor and beta blocker in the past. The echo however does suggest inferior and anterolateral wall motion abnormalities which are new and could indicate graft dysfunction. I performed his last heart catheterization in 2015 which showed all 3 grafts patent.  [name] [name] returns today for follow-up of his left heart catheterization. I perform  this a few weeks ago and he had patent bypass grafts however LV function is reduced. Cardiac output is also reduced. Filling pressures were mildly elevated. He reports since discharge that he's had some improvement of his symptoms. He was started on low-dose beta blocker but he does report fatigue. When I asked him how they compared to previously he said he thinks attack sleep better since he started on the beta blocker but not back to what he thinks his baseline should be. He continues to have problems with left leg pain. He had ultrasound of the left leg which were unrevealing. He also had nerve conduction studies which I sent him for which were not suggestive of neuropathy. His symptoms were worse while lying flat on the Cath Lab table and I suspect this could be again coming from his back and he may need repeat orthopedic evaluation. His primary care provider started him on Celexa recently as well for anxiety.  12/23/2015  [name] [name] was seen back in the office  today in follow-up. Overall he seems to be doing well. He's tolerating low-dose carvedilol and has had some very infrequent morning dizziness. He was previously on Lasix 40 mg twice a day but ran out of the medicine about 8 days ago. He's not had any worsening weight gain or swelling nor worsening shortness of breath over the past week. This could be that he's had some improvement in his cardiomyopathy. Nevertheless, I would like him to be on some Lasix at least until we can determine whether or not he is euvolemic with lab work.  05/27/2016  [name] [name] returns today for follow-up. He's gained about 6 pounds of weight since I last saw him. He denies any worsening shortness of breath or orthostatic dizziness. He saw Dr. Radford Pax in August for following of his obstructive sleep apnea. He remains physically active and denies any chest pain. He recently had a repeat echo in October 2017 which showed a small improvement in LV function with EF up to  35-40%. Heart failure regimen includes aspirin, carvedilol, Crestor, and spironolactone. He is not currently on a ARB is he's had orthostatic symptoms in the past although that his generally resolved. His creatinine is normal.  08/24/2016  I saw [name] [name] today in follow-up. He again gained about 4 pounds over the past month. He felt that the weight gain was heart failure related because he got more short of breath particularly walking up stairs. Based on that he increased his Lasix back to 40 mg daily. He notes that his urine is been darker and his urination is not necessarily improved. He's also been more dizzy. He says he gets somewhat presyncopal with change in position. Lab work 12 days ago indicated a stable creatinine however. Despite this, I feel that he may be somewhat presyncopal perhaps on too high of a dose of diuretics. He is also on Entresto 24/26 and Aldactone 12.5 mg daily. He also complained of some pain across the back between the shoulder blades. He said this got better with resting and drinking water. It's difficult to say whether that it was the water or perhaps resting from his activities. I cannot exclude that this could be angina. His EKG however is reassuring today.  09/21/2016  [name] [name] was seen today in follow-up. I decreased his Lasix, however he does not feel any change in his dizziness. There is no evidence of presyncope. He has gained about 4 pounds which I suspect is some fluid retention. Blood pressure is well-controlled today 118/56.  02/26/2017  [name] [name] returns today for follow-up. He had more recent dizziness and it seemed to worsen on Entresto. He discontinued that and feels that he's now much better. Overall he says he feels well. He is not on ACE inhibitor or ARB but remains on carvedilol. Is also on Lasix 40 mg daily. Blood pressure is stable 122/52.  08/10/2017  [name] [name] returns for follow-up.  Overall he seems to be feeling very well.  Denies any chest  pain or worsening shortness of breath.  He gets occasional dizziness but that is much improved.  Of note an EKG was performed again today and there is a suggestion that there may be underlying atrial flutter.  Heart rate was 74 and EKG looks very similar to his prior EKG.  The computer has interpreted a sinus rhythm with first-degree AV block and bifascicular block.  I compared EKG to his previous EKG last August which looks similar however it is distinctly different from the EKG  last of February.  He is asymptomatic, but not anticoagulated.  Otherwise, labs reviewed from October indicate total cholesterol 134, HDL 36 LDL 67 and triglycerides 157.  Hemoglobin A1c of 7.6 and serum creatinine 0.9.  08/30/2017  [name] [name] returns today for follow-up of his studies.  He underwent an echocardiogram to evaluate for change in LVEF, but more importantly to rule out atrial flutter.  His EKG was abnormal and suggested possible atrial flutter, however he was asymptomatic.  The echo did not show any evidence of atrial flutter.  LVEF was stable at 40%.  He also underwent a home 48-hour Holter monitor.  This demonstrated sinus rhythm with PACs and PVCs, but no evidence of atrial fibrillation or flutter.  Remains asymptomatic.  His only other complaint today is chronic leg pain.  He was seen by Dr. Gwenlyn Found and evaluated with lower extremity arterial Dopplers.  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  A referral to neurosurgery was suggested but not placed.  PMHx:  Past Medical History:  Diagnosis Date  . Arthritis    Knee L - probably  . BPH (benign prostatic hyperplasia)   . CAD (coronary artery disease) 07/06/2011  . CHF (congestive heart failure) ([place])   . Coronary artery disease   . Diabetes mellitus   . Frequency of urination   . High cholesterol   . History of nuclear stress test 09/2010   dipyridamole; mild perfusion defect due to attenuation with mild superimposed  ischemia in apical septal, apical, apical inferior & apical lateral regions; rest LV enlarged in size; prominent gut uptake in infero-apical region; no significant ischemia demonstrated; low risk scan   . HNP (herniated nucleus pulposus), lumbar   . Ischemic cardiomyopathy   . LV dysfunction 07/06/2011  . Nocturia   . Right bundle branch block   . 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 Ellison Hughs  . Sleep apnea    sleep study 10/2010- AHI during total sleep 32.1/hr and during REM 62.3/hr (severe sleep apnea)unable to tolerate c pap  . Spinal stenosis of lumbar region   . Stroke [facility])    [place]     Past Surgical History:  Procedure Laterality Date  . CARDIAC CATHETERIZATION  01/2011   ischemic cardiomyopathy 30-35%, multivessel CAD (Dr. [name])   . CARDIAC CATHETERIZATION  09/13/2015   Procedure: Right/Left Heart Cath and Coronary/Graft Angiography;  Surgeon: [name], MD;  Location: [facility];  Service: Cardiovascular;;  . Colonscopy    . CORONARY ARTERY BYPASS GRAFT  08/03/2011   Procedure: CORONARY ARTERY BYPASS GRAFTING (CABG);  Surgeon: [name], MD;  Location: [facility];  Service: Open Heart Surgery;  Laterality: N/A;  CABG times three using right saphenous vein and left mammary artery usisng endoscope.  Marland Kitchen LEFT HEART CATHETERIZATION WITH CORONARY/GRAFT ANGIOGRAM N/A 09/20/2013   Procedure: LEFT HEART CATHETERIZATION WITH [name];  Surgeon: [name], MD;  Location: [facility] CATH LAB;  Service: Cardiovascular;  Laterality: N/A;  . Lower Extremity Arterial Doppler  03/16/2013   bilat ABIs demonstated normal values; R runoff - posterior tibial & anterior tibial arteries occluded; L runoff - peroneal & posterior tibial arteries occluded, anterior tibial artery appears occluded  . LUMBAR LAMINECTOMY/DECOMPRESSION MICRODISCECTOMY N/A 09/12/2014   Procedure: LUMBAR  DECOMPRESSION L3-L4, L4-L5, MICRODISCECTOMY L4-L5;  Surgeon: [name], MD;  Location: [facility];  Service: Orthopedics;  Laterality: N/A;  . Pilonidal Cyst removed    . TRANSTHORACIC ECHOCARDIOGRAM  11/10/2012   EF 40-45%, mild LVH, mild hypokinesis of anteroseptal myocardium, grade 1 diastolic dysfunction; mild MR & calcifed mitral annulus; LA mild-mod dilated; RA mildly dilated    FAMHx:  Family History  Problem Relation Age of Onset  . Anesthesia problems Daughter   . Cancer Mother   . Lung disease Father        & heart disease  . Colon cancer Sister   . Sudden death Maternal Grandmother     SOCHx:   reports that he quit smoking about 5 years ago. His smoking use included cigars. He quit after 5.00 years of use. He quit smokeless tobacco use about 5 years ago. He reports that he drinks alcohol. He reports that he does not use drugs.  ALLERGIES:  Allergies  Allergen Reactions  . Entresto [Sacubitril-Valsartan] Other (See Comments)    dizziness    ROS: Pertinent items noted in HPI and remainder of comprehensive ROS otherwise negative.  HOME MEDS: Current Outpatient Medications  Medication Sig Dispense Refill  . aspirin 325 MG tablet Take 325 mg by mouth daily.    . carvedilol (COREG) 6.25 MG tablet Take 1 tablet (6.25 mg total) by mouth 2 (two) times daily. 180 tablet 2  . Cholecalciferol (VITAMIN D-3 PO) Take 500 Units by mouth daily.    . furosemide (LASIX) 40 MG tablet TAKE ONE TABLET BY MOUTH ONCE DAILY 30 tablet 9  . gabapentin (NEURONTIN) 300 MG capsule Take 300 mg by mouth daily.     Marland Kitchen glipiZIDE (GLUCOTROL XL) 10 MG 24 hr tablet Take 10 mg by mouth every morning.     . metFORMIN (GLUCOPHAGE-XR) 500 MG 24 hr tablet Take 1,000 mg by mouth 2 (two) times daily.    . Multiple Vitamin (MULTIVITAMIN) tablet Take 1 tablet by mouth daily.    . polycarbophil (FIBERCON) 625 MG tablet Take 625 mg by mouth as needed for mild constipation or moderate constipation.     .  rosuvastatin (CRESTOR) 20 MG tablet Take 10 mg by mouth every evening.     Marland Kitchen spironolactone (ALDACTONE) 25 MG tablet TAKE 1/2 (ONE-HALF) TABLET BY MOUTH ONCE DAILY 45 tablet 2  . tamsulosin (FLOMAX) 0.4 MG CAPS capsule Take 1 capsule by mouth every evening.     . TRADJENTA 5 MG TABS tablet Take 5 mg by mouth daily.     No current facility-administered medications for this visit.     LABS/IMAGING: No results found for this or any previous visit (from the past 48 hour(s)). No results found.  VITALS: BP 132/66   Pulse 75   Ht 6\' 2"  (1.88 m)   Wt 252 lb (114.3 kg)   BMI 32.35 kg/m   EXAM: Deferred  EKG: Deferred  ASSESSMENT: 1. Chronic systolic congestive heart failure - EF 35-40% (03/2016) 2. Coronary artery disease status post three-vessel CABG in 2013 (LIMA to LAD, SVG to PDA and SVG to PLA) - patent grafts by cath in 08/2015 3. Hypertension-controlled 4. Dyslipidemia 5. Diabetes type 2 - better controlled 6. Mild PVD-with normal ABI 7. Hypotension/upper back pain - resolved 8. Spinal stenosis - s/p surgery 9. Trifascicular block 10. OSA-back on BiPAP  PLAN: 1.   Mr. Staubs had an abnormal EKG his last 2 office visits concerning for possible atrial flutter.  I reviewed this with my colleagues and it was not clear whether this was sinus with some artifact  or atrial flutter.  An echocardiogram demonstrated normal sinus rhythm with active atrial waves.  A monitor showed sinus rhythm with PACs and PVCs.  He does not have any evidence for atrial flutter and we will not need additional anticoagulation.  He continues to have back pain and a history of spinal stenosis with prior surgery.  I will refer him back to neurosurgery for evaluation as a peripheral arterial evaluation was normal.  Follow-up in 6 months.   [name], MD, [facility], [place] Director of the Advanced Lipid Disorders &  Cardiovascular Risk Reduction [facility] Diplomate of the  American Board of Clinical Lipidology Attending Cardiologist  Direct Dial: [phone]  Fax: [phone]  Website:  [name] 08/30/2017, 3:17 PM

## 2017-09-24 ENCOUNTER — Other Ambulatory Visit: Payer: Self-pay | Admitting: Internal Medicine

## 2017-09-24 NOTE — Telephone Encounter (Signed)
REFILL 

## 2017-10-05 DIAGNOSIS — M4807 Spinal stenosis, lumbosacral region: Secondary | ICD-10-CM | POA: Diagnosis not present

## 2017-10-11 ENCOUNTER — Other Ambulatory Visit: Payer: Self-pay | Admitting: Neurosurgery

## 2017-10-11 DIAGNOSIS — I739 Peripheral vascular disease, unspecified: Secondary | ICD-10-CM

## 2017-10-22 ENCOUNTER — Ambulatory Visit
Admission: RE | Admit: 2017-10-22 | Discharge: 2017-10-22 | Disposition: A | Discharge status: Home / Self Care | Payer: Medicare Other | Source: Ambulatory Visit | Attending: Neurosurgery | Admitting: Neurosurgery

## 2017-10-22 DIAGNOSIS — I739 Peripheral vascular disease, unspecified: Secondary | ICD-10-CM

## 2017-10-22 DIAGNOSIS — M48061 Spinal stenosis, lumbar region without neurogenic claudication: Secondary | ICD-10-CM | POA: Diagnosis not present

## 2017-10-22 DIAGNOSIS — M5126 Other intervertebral disc displacement, lumbar region: Secondary | ICD-10-CM | POA: Diagnosis not present

## 2017-10-22 MED ORDER — GADOBENATE DIMEGLUMINE 529 MG/ML IV SOLN
20.0000 mL | Freq: Once | INTRAVENOUS | Status: AC | PRN
  Administered 2017-10-22: 20 mL via INTRAVENOUS

## 2017-10-28 ENCOUNTER — Other Ambulatory Visit: Payer: Self-pay | Admitting: Internal Medicine

## 2017-10-29 NOTE — Telephone Encounter (Signed)
Rx sent to pharmacy   

## 2017-11-09 DIAGNOSIS — M5440 Lumbago with sciatica, unspecified side: Secondary | ICD-10-CM | POA: Diagnosis not present

## 2017-11-09 DIAGNOSIS — Z6832 Body mass index (BMI) 32.0-32.9, adult: Secondary | ICD-10-CM | POA: Diagnosis not present

## 2017-12-02 DIAGNOSIS — M5442 Lumbago with sciatica, left side: Secondary | ICD-10-CM | POA: Diagnosis not present

## 2017-12-13 DIAGNOSIS — G4733 Obstructive sleep apnea (adult) (pediatric): Secondary | ICD-10-CM | POA: Diagnosis not present

## 2017-12-13 DIAGNOSIS — E782 Mixed hyperlipidemia: Secondary | ICD-10-CM | POA: Diagnosis not present

## 2017-12-13 DIAGNOSIS — Z0001 Encounter for general adult medical examination with abnormal findings: Secondary | ICD-10-CM | POA: Diagnosis not present

## 2017-12-13 DIAGNOSIS — E1140 Type 2 diabetes mellitus with diabetic neuropathy, unspecified: Secondary | ICD-10-CM | POA: Diagnosis not present

## 2017-12-13 DIAGNOSIS — I10 Essential (primary) hypertension: Secondary | ICD-10-CM | POA: Diagnosis not present

## 2017-12-13 DIAGNOSIS — E1165 Type 2 diabetes mellitus with hyperglycemia: Secondary | ICD-10-CM | POA: Diagnosis not present

## 2017-12-13 DIAGNOSIS — E7800 Pure hypercholesterolemia, unspecified: Secondary | ICD-10-CM | POA: Diagnosis not present

## 2017-12-15 DIAGNOSIS — Z1389 Encounter for screening for other disorder: Secondary | ICD-10-CM | POA: Diagnosis not present

## 2017-12-15 DIAGNOSIS — Z1331 Encounter for screening for depression: Secondary | ICD-10-CM | POA: Diagnosis not present

## 2017-12-15 DIAGNOSIS — F5221 Male erectile disorder: Secondary | ICD-10-CM | POA: Diagnosis not present

## 2017-12-15 DIAGNOSIS — N401 Enlarged prostate with lower urinary tract symptoms: Secondary | ICD-10-CM | POA: Diagnosis not present

## 2017-12-15 DIAGNOSIS — Z6831 Body mass index (BMI) 31.0-31.9, adult: Secondary | ICD-10-CM | POA: Diagnosis not present

## 2017-12-15 DIAGNOSIS — I5022 Chronic systolic (congestive) heart failure: Secondary | ICD-10-CM | POA: Diagnosis not present

## 2017-12-15 DIAGNOSIS — E1140 Type 2 diabetes mellitus with diabetic neuropathy, unspecified: Secondary | ICD-10-CM | POA: Diagnosis not present

## 2017-12-15 DIAGNOSIS — I10 Essential (primary) hypertension: Secondary | ICD-10-CM | POA: Diagnosis not present

## 2017-12-19 ENCOUNTER — Encounter (HOSPITAL_COMMUNITY): Payer: Self-pay | Admitting: Emergency Medicine

## 2017-12-19 ENCOUNTER — Other Ambulatory Visit: Payer: Self-pay

## 2017-12-19 ENCOUNTER — Emergency Department (HOSPITAL_COMMUNITY)
Admission: EM | Admit: 2017-12-19 | Discharge: 2017-12-20 | Disposition: A | Discharge status: Home / Self Care | Payer: Medicare Other | Attending: Emergency Medicine | Admitting: Emergency Medicine

## 2017-12-19 ENCOUNTER — Emergency Department (HOSPITAL_COMMUNITY): Payer: Medicare Other

## 2017-12-19 DIAGNOSIS — R2689 Other abnormalities of gait and mobility: Secondary | ICD-10-CM | POA: Diagnosis not present

## 2017-12-19 DIAGNOSIS — R112 Nausea with vomiting, unspecified: Secondary | ICD-10-CM | POA: Diagnosis present

## 2017-12-19 DIAGNOSIS — Z7982 Long term (current) use of aspirin: Secondary | ICD-10-CM | POA: Insufficient documentation

## 2017-12-19 DIAGNOSIS — Z87891 Personal history of nicotine dependence: Secondary | ICD-10-CM | POA: Diagnosis not present

## 2017-12-19 DIAGNOSIS — E119 Type 2 diabetes mellitus without complications: Secondary | ICD-10-CM | POA: Insufficient documentation

## 2017-12-19 DIAGNOSIS — Z7984 Long term (current) use of oral hypoglycemic drugs: Secondary | ICD-10-CM | POA: Diagnosis not present

## 2017-12-19 DIAGNOSIS — Z8673 Personal history of transient ischemic attack (TIA), and cerebral infarction without residual deficits: Secondary | ICD-10-CM | POA: Diagnosis not present

## 2017-12-19 DIAGNOSIS — Z79899 Other long term (current) drug therapy: Secondary | ICD-10-CM | POA: Diagnosis not present

## 2017-12-19 DIAGNOSIS — I5021 Acute systolic (congestive) heart failure: Secondary | ICD-10-CM | POA: Insufficient documentation

## 2017-12-19 DIAGNOSIS — Z951 Presence of aortocoronary bypass graft: Secondary | ICD-10-CM | POA: Insufficient documentation

## 2017-12-19 DIAGNOSIS — I2510 Atherosclerotic heart disease of native coronary artery without angina pectoris: Secondary | ICD-10-CM | POA: Insufficient documentation

## 2017-12-19 DIAGNOSIS — I951 Orthostatic hypotension: Secondary | ICD-10-CM | POA: Diagnosis not present

## 2017-12-19 LAB — CBC
HEMATOCRIT: 36.8 % — AB (ref 39.0–52.0)
HEMOGLOBIN: 12.9 g/dL — AB (ref 13.0–17.0)
MCH: 30.2 pg (ref 26.0–34.0)
MCHC: 35.1 g/dL (ref 30.0–36.0)
MCV: 86.2 fL (ref 78.0–100.0)
Platelets: 244 10*3/uL (ref 150–400)
RBC: 4.27 MIL/uL (ref 4.22–5.81)
RDW: 13.5 % (ref 11.5–15.5)
WBC: 7.7 10*3/uL (ref 4.0–10.5)

## 2017-12-19 LAB — COMPREHENSIVE METABOLIC PANEL
ALT: 17 U/L (ref 17–63)
AST: 16 U/L (ref 15–41)
Albumin: 3.8 g/dL (ref 3.5–5.0)
Alkaline Phosphatase: 66 U/L (ref 38–126)
Anion gap: 10 (ref 5–15)
BUN: 25 mg/dL — AB (ref 6–20)
CHLORIDE: 102 mmol/L (ref 101–111)
CO2: 24 mmol/L (ref 22–32)
Calcium: 8.8 mg/dL — ABNORMAL LOW (ref 8.9–10.3)
Creatinine, Ser: 1.04 mg/dL (ref 0.61–1.24)
GFR calc Af Amer: >60 mL/min (ref >60)
Glucose, Bld: 208 mg/dL — ABNORMAL HIGH (ref 65–99)
POTASSIUM: 4.5 mmol/L (ref 3.5–5.1)
Sodium: 136 mmol/L (ref 135–145)
Total Bilirubin: 0.8 mg/dL (ref 0.3–1.2)
Total Protein: 7.4 g/dL (ref 6.5–8.1)

## 2017-12-19 LAB — LIPASE, BLOOD: LIPASE: 62 U/L — AB (ref 11–51)

## 2017-12-19 NOTE — ED Triage Notes (Signed)
Pt states he started vomiting this afternoon and states his balance has been off x one week. Pt states blood sugar was 270 this am.

## 2017-12-20 ENCOUNTER — Ambulatory Visit (HOSPITAL_COMMUNITY)
Admission: RE | Admit: 2017-12-20 | Discharge: 2017-12-20 | Disposition: A | Discharge status: Home / Self Care | Payer: Medicare Other | Source: Ambulatory Visit | Attending: Emergency Medicine | Admitting: Emergency Medicine

## 2017-12-20 ENCOUNTER — Telehealth: Payer: Self-pay | Admitting: Internal Medicine

## 2017-12-20 DIAGNOSIS — R200 Anesthesia of skin: Secondary | ICD-10-CM | POA: Diagnosis not present

## 2017-12-20 DIAGNOSIS — R270 Ataxia, unspecified: Secondary | ICD-10-CM | POA: Diagnosis not present

## 2017-12-20 DIAGNOSIS — R2689 Other abnormalities of gait and mobility: Secondary | ICD-10-CM | POA: Diagnosis not present

## 2017-12-20 DIAGNOSIS — I951 Orthostatic hypotension: Secondary | ICD-10-CM | POA: Diagnosis not present

## 2017-12-20 LAB — URINALYSIS, ROUTINE W REFLEX MICROSCOPIC
Bilirubin Urine: NEGATIVE
GLUCOSE, UA: 50 mg/dL — AB
HGB URINE DIPSTICK: NEGATIVE
Ketones, ur: NEGATIVE mg/dL
Leukocytes, UA: NEGATIVE
Nitrite: NEGATIVE
PH: 5 (ref 5.0–8.0)
Protein, ur: NEGATIVE mg/dL
SPECIFIC GRAVITY, URINE: 1.019 (ref 1.005–1.030)

## 2017-12-20 MED ORDER — SODIUM CHLORIDE 0.9 % IV BOLUS
1000.0000 mL | Freq: Once | INTRAVENOUS | Status: AC
  Administered 2017-12-20: 1000 mL via INTRAVENOUS

## 2017-12-20 MED ORDER — ONDANSETRON HCL 4 MG/2ML IJ SOLN
4.0000 mg | Freq: Once | INTRAMUSCULAR | Status: AC
  Administered 2017-12-20: 4 mg via INTRAVENOUS
  Filled 2017-12-20: qty 2

## 2017-12-20 NOTE — Telephone Encounter (Signed)
Ok to cut the lasix dose back to 20 mg daily until he sees me in follow-up in late July.  Dr. Lemmie Evens

## 2017-12-20 NOTE — Telephone Encounter (Signed)
Spoke with emily, aware of dr hilty's recommendations. The first available appt with dr hilty is 01-24-18. Will make sure that is okay with dr hilty and/or if any medication changes need to be made prior to that appt.

## 2017-12-20 NOTE — Telephone Encounter (Signed)
Follow up   [name] [name] calling to see if Dr. Debara Pickett received the Mri that was completed today. Please call

## 2017-12-20 NOTE — Discharge Instructions (Addendum)
Please call Dr Lysbeth Penner office in the morning to let him know about your ED visit. Your blood pressure is getting low when you stand up. He may want to adjust your medications. Please be careful walking so you don't fall and hurt yourself. Call and schedule an appointment to get an outpatient MRI of your brain to look for a new stroke. Your CT scan tonight showed the old stroke that your had. Please follow up with your primary care doctor about that.

## 2017-12-20 NOTE — Telephone Encounter (Signed)
New Message   Patients daughter is calling because they were instructed by Dorita Fray to contact Dr. Alain Honey and have him review the notes from his ER visit on 12/19/2017. Please call to discuss.

## 2017-12-20 NOTE — ED Provider Notes (Signed)
[facility] EMERGENCY DEPARTMENT Provider Note   CSN: [id number] Arrival date & time: 12/19/17  2135  Time seen 23:09 PM    History   Chief Complaint Chief Complaint  Patient presents with  . Emesis    HPI [name] [name] is a 72 y.o. male.  HPI patient states about 10 days ago he started being unstable when he walked.  He states he cannot walk straight, he has to hold onto things and if he does not he feels like he drifts to his left.  He denies headache, change in numbness or tingling of his extremities.  He has had some nausea for the past week and today started vomiting about mid afternoon.  He states whenever he stands up he gets vomiting.  His blood pressure was low earlier today in the high 90s.  He denies a feeling of spinning or feeling lightheaded.  He denies any visual changes, chest pain or shortness of breath.  He states he had a spinal injection done on June 4 for the first time.  He states he has numbness and sometimes weakness in his left leg but has never had any back pain.  He describes that it starts in his left hip and goes down the front of his thigh and then goes down the lateral aspect of his lower leg into his foot.  He states those symptoms are improving since he got the injection.  Family was concerned he was having a heart problem today.  Patient states he had a [place] stroke about 20 years ago.  Past Medical History:  Diagnosis Date  . Arthritis    Knee L - probably  . BPH (benign prostatic hyperplasia)   . CAD (coronary artery disease) 07/06/2011  . CHF (congestive heart failure) ([place])   . Coronary artery disease   . Diabetes mellitus   . Frequency of urination   . High cholesterol   . History of nuclear stress test 09/2010   dipyridamole; mild perfusion defect due to attenuation with mild superimposed ischemia in apical septal, apical, apical inferior & apical lateral regions; rest LV enlarged in size; prominent gut uptake in infero-apical region; no  significant ischemia demonstrated; low risk scan   . HNP (herniated nucleus pulposus), lumbar   . Ischemic cardiomyopathy   . LV dysfunction 07/06/2011  . Nocturia   . Right bundle branch block   . 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 Ellison Hughs  . Sleep apnea    sleep study 10/2010- AHI during total sleep 32.1/hr and during REM 62.3/hr (severe sleep apnea)unable to tolerate c pap  . Spinal stenosis of lumbar region   . Stroke [facility])    [place]     Patient Active Problem List   Diagnosis Date Noted  . Pseudoclaudication 08/30/2017  . Atrial flutter ([place]) 08/10/2017  . Abnormal EKG 08/10/2017  . Peripheral arterial disease ([place]) 04/21/2017  . Oxygen desaturation 09/21/2016  . Chronic systolic heart failure ([place]) 05/27/2016  . Obesity (BMI 30-39.9) 02/16/2016  . Non-ischemic cardiomyopathy ([place]) 12/24/2015  . Medication management 12/24/2015  . SOB (shortness of breath) 12/24/2015  . LV dysfunction   . Acute systolic congestive heart failure, NYHA class 3 ([place]) 09/12/2015  . OSA (obstructive sleep apnea) 04/17/2015  . Spinal stenosis of lumbar region 09/12/2014  . Spinal stenosis at L4-L5 level 09/12/2014  . Dyspnea 10/13/2013  . Fatigue 10/13/2013  .  Hypotension 08/09/2013  . Chest pain 08/09/2013  . S/P CABG x 3: (LIMA-LAD, SVG-PD, SVG-PL) 08/04/2011  . Coronary artery disease involving native coronary artery of native heart without angina pectoris 07/06/2011  . Vasovagal syncope 06/03/2011  . Dehydration 06/02/2011  . Ischemic cardiomyopathy 06/02/2011  . DM2 (diabetes mellitus, type 2) ([place]) 06/02/2011  . Essential hypertension 06/02/2011    Past Surgical History:  Procedure Laterality Date  . CARDIAC CATHETERIZATION  01/2011   ischemic cardiomyopathy 30-35%, multivessel CAD (Dr. [name])   . CARDIAC CATHETERIZATION  09/13/2015   Procedure: Right/Left Heart Cath and Coronary/Graft  Angiography;  Surgeon: [name], MD;  Location: [facility];  Service: Cardiovascular;;  . Colonscopy    . CORONARY ARTERY BYPASS GRAFT  08/03/2011   Procedure: CORONARY ARTERY BYPASS GRAFTING (CABG);  Surgeon: [name], MD;  Location: [facility];  Service: Open Heart Surgery;  Laterality: N/A;  CABG times three using right saphenous vein and left mammary artery usisng endoscope.  Marland Kitchen LEFT HEART CATHETERIZATION WITH CORONARY/GRAFT ANGIOGRAM N/A 09/20/2013   Procedure: LEFT HEART CATHETERIZATION WITH [name];  Surgeon: [name], MD;  Location[facility] CATH LAB;  Service: Cardiovascular;  Laterality: N/A;  . Lower Extremity Arterial Doppler  03/16/2013   bilat ABIs demonstated normal values; R runoff - posterior tibial & anterior tibial arteries occluded; L runoff - peroneal & posterior tibial arteries occluded, anterior tibial artery appears occluded  . LUMBAR LAMINECTOMY/DECOMPRESSION MICRODISCECTOMY N/A 09/12/2014   Procedure: LUMBAR DECOMPRESSION L3-L4, L4-L5, MICRODISCECTOMY L4-L5;  Surgeon: [name], MD;  Location: [facility];  Service: Orthopedics;  Laterality: N/A;  . Pilonidal Cyst removed    . TRANSTHORACIC ECHOCARDIOGRAM  11/10/2012   EF 40-45%, mild LVH, mild hypokinesis of anteroseptal myocardium, grade 1 diastolic dysfunction; mild MR & calcifed mitral annulus; LA mild-mod dilated; RA mildly dilated        Home Medications    Prior to Admission medications   Medication Sig Start Date End Date Taking? Authorizing Provider  aspirin 325 MG tablet Take 325 mg by mouth daily.    [provider]  carvedilol (COREG) 6.25 MG tablet TAKE 1 TABLET BY MOUTH TWICE DAILY 09/24/17   [name], [name], MD  Cholecalciferol (VITAMIN D-3 PO) Take 500 Units by mouth daily.    [provider]  furosemide (LASIX) 40 MG tablet TAKE 1 TABLET BY MOUTH ONCE DAILY 10/29/17   [name], [name], MD  gabapentin (NEURONTIN) 300 MG capsule Take 300 mg by mouth daily.      [provider]  glipiZIDE (GLUCOTROL XL) 10 MG 24 hr tablet Take 10 mg by mouth every morning.     [provider]  metFORMIN (GLUCOPHAGE-XR) 500 MG 24 hr tablet Take 1,000 mg by mouth 2 (two) times daily. 07/05/15   [provider]  Multiple Vitamin (MULTIVITAMIN) tablet Take 1 tablet by mouth daily.    [provider]  polycarbophil (FIBERCON) 625 MG tablet Take 625 mg by mouth as needed for mild constipation or moderate constipation.     [provider]  rosuvastatin (CRESTOR) 20 MG tablet Take 10 mg by mouth every evening.     [provider]  spironolactone (ALDACTONE) 25 MG tablet TAKE 1/2 (ONE-HALF) TABLET BY MOUTH ONCE DAILY 05/06/17   Lorretta Harp, MD  tamsulosin [facility]) 0.4 MG CAPS capsule Take 1 capsule by mouth every evening.  03/25/15   [provider]  TRADJENTA 5 MG TABS tablet Take 5 mg by  mouth daily. 08/02/15   [provider]    Family History Family History  Problem Relation Age of Onset  . Anesthesia problems Daughter   . Cancer Mother   . Lung disease Father        & heart disease  . Colon cancer Sister   . Sudden death Maternal Grandmother     Social History Social History   Tobacco Use  . Smoking status: Former Smoker    Years: 5.00    Types: Cigars    Last attempt to quit: 09/07/2011    Years since quitting: 6.2  . Smokeless tobacco: Former Systems developer    Quit date: 02/28/2012  Substance Use Topics  . Alcohol use: Yes    Alcohol/week: 0.0 oz    Comment: occasional  . Drug use: No  lives at home Lives with spouse   Allergies   Entresto [sacubitril-valsartan]   Review of Systems Review of Systems  All other systems reviewed and are negative.    Physical Exam Updated Vital Signs BP (!) 152/69   Pulse 70   Temp 97.7 F (36.5 C)   Resp 20   Ht 6\' 2"  (1.88 m)   Wt 113.4 kg (250 lb)   SpO2 99%   BMI 32.10 kg/m   Physical Exam  Constitutional: He is oriented to person,  place, and time. He appears well-developed and well-nourished.  Non-toxic appearance. He does not appear ill. No distress.  HENT:  Head: Normocephalic and atraumatic.  Right Ear: External ear normal.  Left Ear: External ear normal.  Nose: Nose normal. No mucosal edema or rhinorrhea.  Mouth/Throat: Oropharynx is clear and moist and mucous membranes are normal. No dental abscesses or uvula swelling.  Eyes: Pupils are equal, round, and reactive to light. Conjunctivae and EOM are normal.  Neck: Normal range of motion and full passive range of motion without pain. Neck supple.  Cardiovascular: Normal rate, regular rhythm and normal heart sounds. Exam reveals no gallop and no friction rub.  No murmur heard. Pulmonary/Chest: Effort normal and breath sounds normal. No respiratory distress. He has no wheezes. He has no rhonchi. He has no rales. He exhibits no tenderness and no crepitus.  Abdominal: Soft. Normal appearance and bowel sounds are normal. He exhibits no distension. There is no tenderness. There is no rebound and no guarding.  Musculoskeletal: Normal range of motion. He exhibits no edema or tenderness.  Moves all extremities well.   Neurological: He is alert and oriented to person, place, and time. He has normal strength. No cranial nerve deficit.  No focal motor weakness was noted, finger-to-nose was coordinated laterally  Skin: Skin is warm, dry and intact. No rash noted. No erythema. No pallor.  Psychiatric: He has a normal mood and affect. His speech is normal and behavior is normal. His mood appears not anxious.  Nursing note and vitals reviewed.  Orthostatic VS for the past 24 hrs:  BP- Lying Pulse- Lying BP- Sitting Pulse- Sitting BP- Standing at 0 minutes Pulse- Standing at 0 minutes  12/19/17 2356 132/64 72 136/68 67 98/52 78    Orthostatic vital signs normal except when he stands he does have a significant drop in his blood pressure without rise of his heart rate   ED  Treatments / Results  Labs (all labs ordered are listed, but only abnormal results are displayed) Results for orders placed or performed during the hospital encounter of 12/19/17  Lipase, blood  Result Value Ref Range   Lipase 62 (  H) 11 - 51 U/L  Comprehensive metabolic panel  Result Value Ref Range   Sodium 136 135 - 145 mmol/L   Potassium 4.5 3.5 - 5.1 mmol/L   Chloride 102 101 - 111 mmol/L   CO2 24 22 - 32 mmol/L   Glucose, Bld 208 (H) 65 - 99 mg/dL   BUN 25 (H) 6 - 20 mg/dL   Creatinine, Ser 1.04 0.61 - 1.24 mg/dL   Calcium 8.8 (L) 8.9 - 10.3 mg/dL   Total Protein 7.4 6.5 - 8.1 g/dL   Albumin 3.8 3.5 - 5.0 g/dL   AST 16 15 - 41 U/L   ALT 17 17 - 63 U/L   Alkaline Phosphatase 66 38 - 126 U/L   Total Bilirubin 0.8 0.3 - 1.2 mg/dL   GFR calc non Af Amer >60 >60 mL/min   GFR calc Af Amer >60 >60 mL/min   Anion gap 10 5 - 15  CBC  Result Value Ref Range   WBC 7.7 4.0 - 10.5 K/uL   RBC 4.27 4.22 - 5.81 MIL/uL   Hemoglobin 12.9 (L) 13.0 - 17.0 g/dL   HCT 36.8 (L) 39.0 - 52.0 %   MCV 86.2 78.0 - 100.0 fL   MCH 30.2 26.0 - 34.0 pg   MCHC 35.1 30.0 - 36.0 g/dL   RDW 13.5 11.5 - 15.5 %   Platelets 244 150 - 400 K/uL  Urinalysis, Routine w reflex microscopic  Result Value Ref Range   Color, Urine YELLOW YELLOW   APPearance CLEAR CLEAR   Specific Gravity, Urine 1.019 1.005 - 1.030   pH 5.0 5.0 - 8.0   Glucose, UA 50 (A) NEGATIVE mg/dL   Hgb urine dipstick NEGATIVE NEGATIVE   Bilirubin Urine NEGATIVE NEGATIVE   Ketones, ur NEGATIVE NEGATIVE mg/dL   Protein, ur NEGATIVE NEGATIVE mg/dL   Nitrite NEGATIVE NEGATIVE   Leukocytes, UA NEGATIVE NEGATIVE    Laboratory interpretation all normal except mild hyperglycemia   EKG None  Radiology Ct Head Wo Contrast  Result Date: 12/20/2017 CLINICAL DATA:  72 year old male with vomiting and off balance. EXAM: CT HEAD WITHOUT CONTRAST TECHNIQUE: Contiguous axial images were obtained from the base of the skull through the vertex  without intravenous contrast. COMPARISON:  None. FINDINGS: Brain: The ventricles and sulci appropriate size for patient's age. Mild periventricular and deep white matter chronic microvascular ischemic changes noted. Bilateral basal ganglia old lacunar infarcts versus less likely dilated prevascular spaces. There is an area of old infarct and encephalomalacia in the inferior left cerebellar hemisphere. There is no acute intracranial hemorrhage. No mass effect or midline shift. No extra-axial fluid collection. Vascular: No hyperdense vessel or unexpected calcification. Skull: No acute calvarial pathology. Sinuses/Orbits: Minimal mucoperiosteal thickening of paranasal sinuses. No air-fluid levels. The mastoid air cells are clear. Other: None IMPRESSION: 1. No acute intracranial hemorrhage. 2. Mild age-related atrophy and chronic microvascular ischemic changes. 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 Electronically Signed   By: Anner Crete M.D.   On: 12/20/2017 01:04    Procedures Procedures (including critical care time)  Medications Ordered in ED Medications  sodium chloride 0.9 % bolus 1,000 mL (0 mLs Intravenous Stopped 12/20/17 0131)  ondansetron (ZOFRAN) injection 4 mg (4 mg Intravenous Given 12/20/17 0041)     Initial Impression / Assessment and Plan / ED Course  I have reviewed the triage vital signs and the nursing notes.  Pertinent labs & imaging results that were available during my care of the patient were  reviewed by me and considered in my medical decision making (see chart for details).   Laboratory testing was done, CT of the head was done to look for evidence of a new stroke.  Patient was given IV fluids and IV Zofran.  Orthostatic vital signs were noted.  Nursing staff ambulated patient and I observed him walking around the nurses station without assistance and without obviously being off balance.  He was walking straight and appeared to be doing it  easily.  01:40 AM talk to the patient and his family about his orthostatic vital signs.  He is on Coreg and 2 diuretics.  I think he should talk to his cardiologist to see if he needs his medications adjusted, he may be having significant or symptomatic orthostasis.  Also to be thorough suggested MRI of the brain which is not available at night or weekends to look for any acute stroke.  However I would think if he had an acute stroke 10 days ago that would have been visible on the CT scan.  However he is agreeable to getting an outpatient MRI to make sure he did not have another stroke.  He can follow-up with his primary care doctor about that.  Daughter is also concerned about his high blood sugar, I explained to her that it was not high enough to be addressed while in the ED.  They can follow-up with his primary care doctor for control of his diabetes.  Daughter keeps talking about patient having dizziness however patient denies having dizziness.  Final Clinical Impressions(s) / ED Diagnoses   Final diagnoses:  Orthostatic hypotension  Balance problems    ED Discharge Orders        Ordered    MR BRAIN WO CONTRAST     12/20/17 0152      Plan discharge  Rolland Porter, MD, Barbette Or, MD 12/20/17 [phone]

## 2017-12-20 NOTE — Telephone Encounter (Signed)
Left message for emily to call

## 2017-12-20 NOTE — ED Notes (Signed)
Pt ambulated without assistance

## 2017-12-20 NOTE — Telephone Encounter (Signed)
Yes .. Would like to see back. May decrease his diuretic. MRI looks negative.  Dr Lemmie Evens

## 2017-12-20 NOTE — Telephone Encounter (Signed)
Returned call to patient, patient reports he had been having trouble with balance x 10 days.   Yesterday he was off balance and become nauseated so he went to the ER to be evaluated.  They did a CT scan that was negative and feel as if this was due to dehydration.   They are doing MRI today to rule out stroke as patient has history of.     His balance is better today.  He was instructed to call Dr. Debara Pickett to have him look at records to see if any medication changes need to be made.    Advised that I would make Dr. Debara Pickett aware but will more than likely need to be seen to evaluate.    He agreed and will get MRI this morning and call back to schedule appointment.

## 2017-12-21 NOTE — Telephone Encounter (Signed)
Patient notified of MD recommendations. He agrees w/plan. Med list updated

## 2018-01-10 DIAGNOSIS — M5442 Lumbago with sciatica, left side: Secondary | ICD-10-CM | POA: Diagnosis not present

## 2018-01-24 ENCOUNTER — Encounter: Payer: Self-pay | Admitting: Internal Medicine

## 2018-01-24 ENCOUNTER — Ambulatory Visit (INDEPENDENT_AMBULATORY_CARE_PROVIDER_SITE_OTHER): Payer: Medicare Other | Admitting: Internal Medicine

## 2018-01-24 VITALS — BP 115/64 | HR 64 | Ht 74.0 in | Wt 246.4 lb

## 2018-01-24 DIAGNOSIS — I255 Ischemic cardiomyopathy: Secondary | ICD-10-CM

## 2018-01-24 DIAGNOSIS — Z951 Presence of aortocoronary bypass graft: Secondary | ICD-10-CM

## 2018-01-24 DIAGNOSIS — I10 Essential (primary) hypertension: Secondary | ICD-10-CM | POA: Diagnosis not present

## 2018-01-24 NOTE — Progress Notes (Signed)
OFFICE NOTE  Chief Complaint:  No complaints  Primary Care Physician: [name] [name], MD  HPI:  [name] [name] is a pleasant 72 year old gentleman previously followed by Dr. Rollene [name]. His past medical history is significant for coronary artery disease. In 2013 he underwent multivessel CABG for an ischemic cardiomyopathy. EF was 20-25% prior to surgery however post surgery his EF had improved up to 45-50%. Recently his EF was 40-45% by echo in May of 2014. There was mild mitral regurgitation, mild to moderately dilated left atrium and mild LVH. [name] [name] has been describing some anxiety. He also reports some pain in his legs however underwent Dopplers in September 2014 which showed preserved ABIs bilaterally a 1.0 on the right and 1.1 on the left. The bilateral peroneal and posterior tibial arteries were occluded. He reports some optimal control of his diabetes. His A1c was 7.2. He is concerned about the cost of taking Tradjenta. He is followed by Dr. Elyse [name].   He was recently seen in the emergency room and he can hospital. There he presented with hypotension and was given 1 L normal saline and his symptoms improved. He had been having dizziness as well as some upper back pain into the left shoulder blade with exertion recently. The symptoms are worse particularly when climbing up ladders or going up stairs and are relieved at rest. After that hospitalization it was recommended that he discontinue his ACE inhibitor and beta blocker, and his blood pressure appears improved today up to 110/60.  [name] [name] returns today for followup of his stress test. This is interpreted as nonischemic with an EF of 44%. He reports still feeling very fatigued and extremely short of breath when doing activities. He says that the other day he tried to hit golf balls and felt that he was totally exhausted after just an hour. He reports his blood pressure has improved somewhat off of all medications however  remains low. He has had symptoms of orthostatic hypotension in the past. His symptoms do feel similar to prior to his bypass surgery. He also feels that he was much better after surgery than he is now and that his symptoms have been going on for the past 2 months.  At his last office visit, I recommended that [name] [name] have a repeat cardiac catheterization (08/2013). This demonstrated the following:  Impression:  1. 2 vessel native CAD with occluded LAD in the mid-vessel  2. Patent LIMA-LAD, SVG to PLA and SVG to PDA grafts with TIMI III flow  3. LVEDP = 13 mmHg  4. Random PVC's were noted  Based on these findings, I could not find any new cardiac cause of his symptoms. I suspect that some of his fatigue could be related to labile blood sugars. He says unexplained hypotension but is normotensive off of medications.  [name] [name] returns today for followup. He now reports feeling better since he had change in his medications. He is established with [name] [name] who adjusted his diabetes medicines and stopped his Invokana. His shortness of breath and fatigue in both improved. He is now been expected some problems with left heel pain. He was found to have some spinal stenosis but has had improvement with inserts in his shoes.  I saw [name] [name] back in the office today. He has successfully underwent back surgery earlier this year which she says initially was much improved, however he says he subsequently developed more pain going down his legs. This sounds like it's  neuropathic pain, but is reluctant to try medication for it. He also significantly fatigue. This could be due to a number of etiologies. 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 Also, he is noted to have a history of obstructive sleep apnea. He was placed on BiPAP, but has not been compliant with that due to difficulty wearing the mask. He also  never had follow-up of his sleep study.  [name] [name] returns today for follow-up. He reports he's had worsening dyspnea and fatigue. He says that he can hardly sleep at night. He is struggling with significant anxiety. 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 He's not been wearing the BiPAP due to significant anxiety and difficulty sleeping. He feels like he gets smothered with his machine. Communication between [name] [name] and [name] [name] led to the starting of Celexa as well as Klonopin to use as needed at night. He says that it does give him about 6 hours of sleep. He has not been using his BiPAP as instructed. He is reportedly had more worsening shortness of breath. He was seen in the ER for this and it was thought this was due [email or web address] with BiPAP. His BNP however is elevated over 300. Today in the office he says that he's had more swelling and more abdominal fullness as well as leg edema.  I had the pleasure seeing [name] [name] back today in the office. He seems to respond nicely to Lasix. I started him on 40 mg daily and while I was out of town my partner reviewed his lab work which included an elevated BNP over 300. He increased his Lasix to 40 mg twice a day. [name] [name] says he's had a significant improvement in his breathing. The BNP now is in the mid 200s. Renal function is stable based on labs today. Unfortunately, his echocardiogram does show a new cardiomyopathy with EF 30-35%. It should be noted that he felt "awful" on blood pressure/heart failure medicines and had taken himself off of ACE inhibitor and beta blocker in the past. The echo however does suggest inferior and anterolateral wall motion abnormalities which are new and could indicate graft dysfunction. I performed his last heart catheterization in 2015 which showed all 3 grafts patent.  [name] [name] returns today for follow-up of his left heart catheterization. I perform  this a few weeks ago and he had patent bypass grafts however LV function is reduced. Cardiac output is also reduced. Filling pressures were mildly elevated. He reports since discharge that he's had some improvement of his symptoms. He was started on low-dose beta blocker but he does report fatigue. When I asked him how they compared to previously he said he thinks attack sleep better since he started on the beta blocker but not back to what he thinks his baseline should be. He continues to have problems with left leg pain. He had ultrasound of the left leg which were unrevealing. He also had nerve conduction studies which I sent him for which were not suggestive of neuropathy. His symptoms were worse while lying flat on the Cath Lab table and I suspect this could be again coming from his back and he may need repeat orthopedic evaluation. His primary care provider started him on Celexa recently as well for anxiety.  12/23/2015  [name] [name] was seen back in the office  today in follow-up. Overall he seems to be doing well. He's tolerating low-dose carvedilol and has had some very infrequent morning dizziness. He was previously on Lasix 40 mg twice a day but ran out of the medicine about 8 days ago. He's not had any worsening weight gain or swelling nor worsening shortness of breath over the past week. This could be that he's had some improvement in his cardiomyopathy. Nevertheless, I would like him to be on some Lasix at least until we can determine whether or not he is euvolemic with lab work.  05/27/2016  [name] [name] returns today for follow-up. He's gained about 6 pounds of weight since I last saw him. He denies any worsening shortness of breath or orthostatic dizziness. He saw [name] [name] in August for following of his obstructive sleep apnea. He remains physically active and denies any chest pain. He recently had a repeat echo in October 2017 which showed a small improvement in LV function with EF up to  35-40%. Heart failure regimen includes aspirin, carvedilol, Crestor, and spironolactone. He is not currently on a ARB is he's had orthostatic symptoms in the past although that his generally resolved. His creatinine is normal.  08/24/2016  I saw [name] [name] today in follow-up. He again gained about 4 pounds over the past month. He felt that the weight gain was heart failure related because he got more short of breath particularly walking up stairs. Based on that he increased his Lasix back to 40 mg daily. He notes that his urine is been darker and his urination is not necessarily improved. He's also been more dizzy. He says he gets somewhat presyncopal with change in position. Lab work 12 days ago indicated a stable creatinine however. Despite this, I feel that he may be somewhat presyncopal perhaps on too high of a dose of diuretics. He is also on Entresto 24/26 and Aldactone 12.5 mg daily. He also complained of some pain across the back between the shoulder blades. He said this got better with resting and drinking water. It's difficult to say whether that it was the water or perhaps resting from his activities. I cannot exclude that this could be angina. His EKG however is reassuring today.  09/21/2016  [name] [name] was seen today in follow-up. I decreased his Lasix, however he does not feel any change in his dizziness. There is no evidence of presyncope. He has gained about 4 pounds which I suspect is some fluid retention. Blood pressure is well-controlled today 118/56.  02/26/2017  [name] [name] returns today for follow-up. He had more recent dizziness and it seemed to worsen on Entresto. He discontinued that and feels that he's now much better. Overall he says he feels well. He is not on ACE inhibitor or ARB but remains on carvedilol. Is also on Lasix 40 mg daily. Blood pressure is stable 122/52.  08/10/2017  [name] [name] returns for follow-up.  Overall he seems to be feeling very well.  Denies any chest  pain or worsening shortness of breath.  He gets occasional dizziness but that is much improved.  Of note an EKG was performed again today and there is a suggestion that there may be underlying atrial flutter.  Heart rate was 74 and EKG looks very similar to his prior EKG.  The computer has interpreted a sinus rhythm with first-degree AV block and bifascicular block.  I compared EKG to his previous EKG last August which looks similar however it is distinctly different from the EKG  last of February.  He is asymptomatic, but not anticoagulated.  Otherwise, labs reviewed from October indicate total cholesterol 134, HDL 36 LDL 67 and triglycerides 157.  Hemoglobin A1c of 7.6 and serum creatinine 0.9.  08/30/2017  [name] [name] returns today for follow-up of his studies.  He underwent an echocardiogram to evaluate for change in LVEF, but more importantly to rule out atrial flutter.  His EKG was abnormal and suggested possible atrial flutter, however he was asymptomatic.  The echo did not show any evidence of atrial flutter.  LVEF was stable at 40%.  He also underwent a home 48-hour Holter monitor.  This demonstrated sinus rhythm with PACs and PVCs, but no evidence of atrial fibrillation or flutter.  Remains asymptomatic.  His only other complaint today is chronic leg pain.  He was seen by Dr. Gwenlyn Found and evaluated with lower extremity arterial Dopplers.  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  A referral to neurosurgery was suggested but not placed.  01/24/2018  [name] [name] returns today for follow-up.  Overall he says he is doing really well.  He underwent 2 back injections and has had improvement in his back pain and leg pain.  He had an episode in June where he became somewhat dehydrated.  He presented to the ER with orthostatic hypotension.  His Lasix was cut back to 20 mg daily.  Weight is stayed stable effect is been improved from 252-246.  He says he is actually had  enough energy to play golf recently.  PMHx:  Past Medical History:  Diagnosis Date  . Arthritis    Knee L - probably  . BPH (benign prostatic hyperplasia)   . CAD (coronary artery disease) 07/06/2011  . [place]ive heart [place]   . Coronary artery disease   . Diabetes mellitus   . Frequency of urination   . High cholesterol   . History of nuclear stress test 09/2010   dipyridamole; mild perfusion defect due to attenuation with mild superimposed ischemia in apical septal, apical, apical inferior & apical lateral regions; rest LV enlarged in size; prominent gut uptake in infero-apical region; no significant ischemia demonstrated; low risk scan   . HNP (herniated nucleus pulposus), lumbar   . Ischemic cardiomyopathy   . LV dysfunction 07/06/2011  . Nocturia   . Right bundle branch block   . 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 Ellison Hughs  . Sleep apnea    sleep study 10/2010- AHI during total sleep 32.1/hr and during REM 62.3/hr (severe sleep apnea)unable to tolerate c pap  . Spinal stenosis of lumbar region   . Stroke [facility])    [place]     Past Surgical History:  Procedure Laterality Date  . CARDIAC CATHETERIZATION  01/2011   ischemic cardiomyopathy 30-35%, multivessel CAD (Dr. [name])   . CARDIAC CATHETERIZATION  09/13/2015   Procedure: Right/Left Heart Cath and Coronary/Graft Angiography;  Surgeon: [name], MD;  Location: [place] [facility];  Service: Cardiovascular;;  . Colonscopy    . CORONARY ARTERY BYPASS GRAFT  08/03/2011   Procedure: CORONARY ARTERY BYPASS GRAFTING (CABG);  Surgeon: [name], MD;  Location: [place];  Service: Open Heart Surgery;  Laterality: N/A;  CABG times three using right saphenous vein and left mammary artery usisng endoscope.  Marland Kitchen LEFT HEART CATHETERIZATION WITH CORONARY/GRAFT ANGIOGRAM N/A 09/20/2013  Procedure: LEFT HEART CATHETERIZATION WITH  [name];  Surgeon: [name], MD;  Location: [facility];  Service: Cardiovascular;  Laterality: N/A;  . Lower Extremity Arterial Doppler  03/16/2013   bilat ABIs demonstated normal values; R runoff - posterior tibial & anterior tibial arteries occluded; L runoff - peroneal & posterior tibial arteries occluded, anterior tibial artery appears occluded  . LUMBAR LAMINECTOMY/DECOMPRESSION MICRODISCECTOMY N/A 09/12/2014   Procedure: LUMBAR DECOMPRESSION L3-L4, L4-L5, MICRODISCECTOMY L4-L5;  Surgeon: [name], MD;  Location: [facility];  Service: Orthopedics;  Laterality: N/A;  . Pilonidal Cyst removed    . TRANSTHORACIC ECHOCARDIOGRAM  11/10/2012   EF 40-45%, mild LVH, mild hypokinesis of anteroseptal myocardium, grade 1 diastolic dysfunction; mild MR & calcifed mitral annulus; LA mild-mod dilated; RA mildly dilated    FAMHx:  Family History  Problem Relation Age of Onset  . Anesthesia problems Daughter   . Cancer Mother   . Lung disease Father        & heart disease  . Colon cancer Sister   . Sudden death Maternal Grandmother     SOCHx:   reports that he quit smoking about 6 years ago. His smoking use included cigars. He quit after 5.00 years of use. He quit smokeless tobacco use about 5 years ago. He reports that he drinks alcohol. He reports that he does not use drugs.  ALLERGIES:  Allergies  Allergen Reactions  . Entresto [Sacubitril-Valsartan] Other (See Comments)    dizziness    ROS: Pertinent items noted in HPI and remainder of comprehensive ROS otherwise negative.  HOME MEDS: Current Outpatient Medications  Medication Sig Dispense Refill  . aspirin 325 MG tablet Take 325 mg by mouth daily.    . carvedilol (COREG) 6.25 MG tablet TAKE 1 TABLET BY MOUTH TWICE DAILY 180 tablet 1  . Cholecalciferol (VITAMIN D-3 PO) Take 500 Units by mouth daily.    . furosemide (LASIX) 40 MG tablet Take 20 mg by mouth daily.    Marland Kitchen gabapentin (NEURONTIN) 300 MG capsule Take  300 mg by mouth daily.     Marland Kitchen glipiZIDE (GLUCOTROL XL) 10 MG 24 hr tablet Take 10 mg by mouth every morning.     . metFORMIN (GLUCOPHAGE-XR) 500 MG 24 hr tablet Take 1,000 mg by mouth 2 (two) times daily.    . Multiple Vitamin (MULTIVITAMIN) tablet Take 1 tablet by mouth daily.    . polycarbophil (FIBERCON) 625 MG tablet Take 625 mg by mouth as needed for mild constipation or moderate constipation.     . rosuvastatin (CRESTOR) 20 MG tablet Take 10 mg by mouth every evening.     Marland Kitchen spironolactone (ALDACTONE) 25 MG tablet TAKE 1/2 (ONE-HALF) TABLET BY MOUTH ONCE DAILY 45 tablet 2  . tamsulosin (FLOMAX) 0.4 MG CAPS capsule Take 1 capsule by mouth every evening.     . TRADJENTA 5 MG TABS tablet Take 5 mg by mouth daily.     No current facility-administered medications for this visit.     LABS/IMAGING: No results found for this or any previous visit (from the past 48 hour(s)). No results found.  VITALS: BP 115/64   Pulse 64   Ht 6\' 2"  (1.88 m)   Wt 246 lb 6.4 oz (111.8 kg)   BMI 31.64 kg/m   EXAM: General appearance: alert and no distress Neck: no carotid bruit, no JVD and thyroid not enlarged, symmetric, no tenderness/mass/nodules Lungs: clear to auscultation bilaterally Heart: regular rate and rhythm, S1, S2 normal, no  murmur, click, rub or gallop Abdomen: soft, non-tender; bowel sounds normal; no masses,  no organomegaly Extremities: extremities normal, atraumatic, no cyanosis or edema Pulses: 2+ and symmetric Skin: Skin color, texture, turgor normal. No rashes or lesions Neurologic: Grossly normal Psych: Pleasant  EKG: Sinus rhythm first-degree AV block, block bifascicular block at 64-personally reviewed  ASSESSMENT: 1. Chronic systolic congestive heart failure - EF 35-40% (03/2016) 2. Coronary artery disease status post three-vessel CABG in 2013 (LIMA to LAD, SVG to PDA and SVG to PLA) - patent grafts by cath in 08/2015 3. Hypertension-controlled 4. Dyslipidemia 5. Diabetes  type 2 - better controlled 6. Mild PVD-with normal ABI 7. Hypotension/upper back pain - resolved 8. Spinal stenosis - s/p surgery 9. Trifascicular block 10. OSA-back on BiPAP  PLAN: 1.   Mr. Vi continues to do well.  He had an episode of orthostatic hypotension in June but this might of been related to high blood sugars in the setting of steroid injection.  His diuretic was cut back to 20 mg.  His weight is actually lower than it had been and he seems to be feeling well.  We will continue his current medications.  His EKG is stable.  He should follow-up in 6 months or sooner as necessary.  Follow-up in 6 months.   [name], MD, [facility], [place] Director of the [facility] Lip[facility]  Cardiovascular [facility] Diplomate of the American Board of Clinical Lipidology Attending Cardiologist  Direct Dial: [phone]  Fax: [phone]  Website:  [email or web address] 01/24/2018, 8:57 AM

## 2018-01-24 NOTE — Patient Instructions (Addendum)
Your physician has recommended you make the following change in your medication:  DECREASE aspirin to 81mg  daily  Your physician wants you to follow-up in: 6 months with Dr. Debara Pickett. You will receive a reminder letter in the mail two months in advance. If you don't receive a letter, please call our office to schedule the follow-up appointment.

## 2018-01-28 ENCOUNTER — Other Ambulatory Visit: Payer: Self-pay

## 2018-02-07 ENCOUNTER — Other Ambulatory Visit: Payer: Self-pay | Admitting: Cardiovascular Disease

## 2018-02-07 NOTE — Telephone Encounter (Signed)
Rx request sent to pharmacy.  

## 2018-02-11 DIAGNOSIS — M5440 Lumbago with sciatica, unspecified side: Secondary | ICD-10-CM | POA: Diagnosis not present

## 2018-03-25 ENCOUNTER — Other Ambulatory Visit: Payer: Self-pay | Admitting: Internal Medicine

## 2018-04-15 DIAGNOSIS — E782 Mixed hyperlipidemia: Secondary | ICD-10-CM | POA: Diagnosis not present

## 2018-04-15 DIAGNOSIS — I10 Essential (primary) hypertension: Secondary | ICD-10-CM | POA: Diagnosis not present

## 2018-04-15 DIAGNOSIS — Z0000 Encounter for general adult medical examination without abnormal findings: Secondary | ICD-10-CM | POA: Diagnosis not present

## 2018-04-15 DIAGNOSIS — Z23 Encounter for immunization: Secondary | ICD-10-CM | POA: Diagnosis not present

## 2018-04-15 DIAGNOSIS — E1165 Type 2 diabetes mellitus with hyperglycemia: Secondary | ICD-10-CM | POA: Diagnosis not present

## 2018-04-15 DIAGNOSIS — Z6831 Body mass index (BMI) 31.0-31.9, adult: Secondary | ICD-10-CM | POA: Diagnosis not present

## 2018-08-01 ENCOUNTER — Ambulatory Visit (INDEPENDENT_AMBULATORY_CARE_PROVIDER_SITE_OTHER): Payer: Medicare Other | Admitting: Internal Medicine

## 2018-08-01 ENCOUNTER — Encounter: Payer: Self-pay | Admitting: Internal Medicine

## 2018-08-01 VITALS — BP 116/58 | HR 59 | Ht 74.0 in | Wt 246.0 lb

## 2018-08-01 DIAGNOSIS — I255 Ischemic cardiomyopathy: Secondary | ICD-10-CM | POA: Diagnosis not present

## 2018-08-01 DIAGNOSIS — I951 Orthostatic hypotension: Secondary | ICD-10-CM

## 2018-08-01 DIAGNOSIS — Z951 Presence of aortocoronary bypass graft: Secondary | ICD-10-CM

## 2018-08-01 DIAGNOSIS — I10 Essential (primary) hypertension: Secondary | ICD-10-CM | POA: Diagnosis not present

## 2018-08-01 NOTE — Progress Notes (Signed)
OFFICE NOTE  Chief Complaint:  Occasional dizziness  Primary Care Physician: [name] [name], MD  HPI:  [name] [name] is a pleasant 73 year old gentleman previously followed by Dr. Rollene Fare. His past medical history is significant for coronary artery disease. In 2013 he underwent multivessel CABG for an ischemic cardiomyopathy. EF was 20-25% prior to surgery however post surgery his EF had improved up to 45-50%. Recently his EF was 40-45% by echo in May of 2014. There was mild mitral regurgitation, mild to moderately dilated left atrium and mild LVH. [name] [name] has been describing some anxiety. He also reports some pain in his legs however underwent Dopplers in September 2014 which showed preserved ABIs bilaterally a 1.0 on the right and 1.1 on the left. The bilateral peroneal and posterior tibial arteries were occluded. He reports some optimal control of his diabetes. His A1c was 7.2. He is concerned about the cost of taking Tradjenta. He is followed by Dr. Elyse Hsu.   He was recently seen in the emergency room and he can hospital. There he presented with hypotension and was given 1 L normal saline and his symptoms improved. He had been having dizziness as well as some upper back pain into the left shoulder blade with exertion recently. The symptoms are worse particularly when climbing up ladders or going up stairs and are relieved at rest. After that hospitalization it was recommended that he discontinue his ACE inhibitor and beta blocker, and his blood pressure appears improved today up to 110/60.  [name] [name] returns today for followup of his stress test. This is interpreted as nonischemic with an EF of 44%. He reports still feeling very fatigued and extremely short of breath when doing activities. He says that the other day he tried to hit golf balls and felt that he was totally exhausted after just an hour. He reports his blood pressure has improved somewhat off of all medications  however remains low. He has had symptoms of orthostatic hypotension in the past. His symptoms do feel similar to prior to his bypass surgery. He also feels that he was much better after surgery than he is now and that his symptoms have been going on for the past 2 months.  At his last office visit, I recommended that [name] [name] have a repeat cardiac catheterization (08/2013). This demonstrated the following:  Impression:  1. 2 vessel native CAD with occluded LAD in the mid-vessel  2. Patent LIMA-LAD, SVG to PLA and SVG to PDA grafts with TIMI III flow  3. LVEDP = 13 mmHg  4. Random PVC's were noted  Based on these findings, I could not find any new cardiac cause of his symptoms. I suspect that some of his fatigue could be related to labile blood sugars. He says unexplained hypotension but is normotensive off of medications.  [name] [name] returns today for followup. He now reports feeling better since he had change in his medications. He is established with Dr. Buddy Duty who adjusted his diabetes medicines and stopped his Invokana. His shortness of breath and fatigue in both improved. He is now been expected some problems with left heel pain. He was found to have some spinal stenosis but has had improvement with inserts in his shoes.  I saw [name] [name] back in the office today. He has successfully underwent back surgery earlier this year which she says initially was much improved, however he says he subsequently developed more pain going down his legs. This sounds like it's  neuropathic pain, but is reluctant to try medication for it. He also significantly fatigue. This could be due to a number of etiologies. 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 Also, he is noted to have a history of obstructive sleep apnea. He was placed on BiPAP, but has not been compliant with that due to difficulty wearing the mask.  He also never had follow-up of his sleep study.  [name] [name] returns today for follow-up. He reports he's had worsening dyspnea and fatigue. He says that he can hardly sleep at night. He is struggling with significant anxiety. 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 He's not been wearing the BiPAP due to significant anxiety and difficulty sleeping. He feels like he gets smothered with his machine. Communication between Dr. Radford Pax and Dr. Quintin Alto led to the starting of Celexa as well as Klonopin to use as needed at night. He says that it does give him about 6 hours of sleep. He has not been using his BiPAP as instructed. He is reportedly had more worsening shortness of breath. He was seen in the ER for this and it was thought this was due [email or web address] with BiPAP. His BNP however is elevated over 300. Today in the office he says that he's had more swelling and more abdominal fullness as well as leg edema.  I had the pleasure seeing [name] [name] back today in the office. He seems to respond nicely to Lasix. I started him on 40 mg daily and while I was out of town my partner reviewed his lab work which included an elevated BNP over 300. He increased his Lasix to 40 mg twice a day. [name] [name] says he's had a significant improvement in his breathing. The BNP now is in the mid 200s. Renal function is stable based on labs today. Unfortunately, his echocardiogram does show a new cardiomyopathy with EF 30-35%. It should be noted that he felt "awful" on blood pressure/heart failure medicines and had taken himself off of ACE inhibitor and beta blocker in the past. The echo however does suggest inferior and anterolateral wall motion abnormalities which are new and could indicate graft dysfunction. I performed his last heart catheterization in 2015 which showed all 3 grafts patent.  [name] [name] returns today for follow-up of his left heart catheterization. I  perform this a few weeks ago and he had patent bypass grafts however LV function is reduced. Cardiac output is also reduced. Filling pressures were mildly elevated. He reports since discharge that he's had some improvement of his symptoms. He was started on low-dose beta blocker but he does report fatigue. When I asked him how they compared to previously he said he thinks attack sleep better since he started on the beta blocker but not back to what he thinks his baseline should be. He continues to have problems with left leg pain. He had ultrasound of the left leg which were unrevealing. He also had nerve conduction studies which I sent him for which were not suggestive of neuropathy. His symptoms were worse while lying flat on the Cath Lab table and I suspect this could be again coming from his back and he may need repeat orthopedic evaluation. His primary care provider started him on Celexa recently as well for anxiety.  12/23/2015  [name] [name] was seen back in the office  today in follow-up. Overall he seems to be doing well. He's tolerating low-dose carvedilol and has had some very infrequent morning dizziness. He was previously on Lasix 40 mg twice a day but ran out of the medicine about 8 days ago. He's not had any worsening weight gain or swelling nor worsening shortness of breath over the past week. This could be that he's had some improvement in his cardiomyopathy. Nevertheless, I would like him to be on some Lasix at least until we can determine whether or not he is euvolemic with lab work.  05/27/2016  [name] [name] returns today for follow-up. He's gained about 6 pounds of weight since I last saw him. He denies any worsening shortness of breath or orthostatic dizziness. He saw Dr. Radford Pax in August for following of his obstructive sleep apnea. He remains physically active and denies any chest pain. He recently had a repeat echo in October 2017 which showed a small improvement in LV function with EF up  to 35-40%. Heart failure regimen includes aspirin, carvedilol, Crestor, and spironolactone. He is not currently on a ARB is he's had orthostatic symptoms in the past although that his generally resolved. His creatinine is normal.  08/24/2016  I saw [name] [name] today in follow-up. He again gained about 4 pounds over the past month. He felt that the weight gain was heart failure related because he got more short of breath particularly walking up stairs. Based on that he increased his Lasix back to 40 mg daily. He notes that his urine is been darker and his urination is not necessarily improved. He's also been more dizzy. He says he gets somewhat presyncopal with change in position. Lab work 12 days ago indicated a stable creatinine however. Despite this, I feel that he may be somewhat presyncopal perhaps on too high of a dose of diuretics. He is also on Entresto 24/26 and Aldactone 12.5 mg daily. He also complained of some pain across the back between the shoulder blades. He said this got better with resting and drinking water. It's difficult to say whether that it was the water or perhaps resting from his activities. I cannot exclude that this could be angina. His EKG however is reassuring today.  09/21/2016  [name] [name] was seen today in follow-up. I decreased his Lasix, however he does not feel any change in his dizziness. There is no evidence of presyncope. He has gained about 4 pounds which I suspect is some fluid retention. Blood pressure is well-controlled today 118/56.  02/26/2017  [name] [name] returns today for follow-up. He had more recent dizziness and it seemed to worsen on Entresto. He discontinued that and feels that he's now much better. Overall he says he feels well. He is not on ACE inhibitor or ARB but remains on carvedilol. Is also on Lasix 40 mg daily. Blood pressure is stable 122/52.  08/10/2017  [name] [name] returns for follow-up.  Overall he seems to be feeling very well.  Denies any chest  pain or worsening shortness of breath.  He gets occasional dizziness but that is much improved.  Of note an EKG was performed again today and there is a suggestion that there may be underlying atrial flutter.  Heart rate was 74 and EKG looks very similar to his prior EKG.  The computer has interpreted a sinus rhythm with first-degree AV block and bifascicular block.  I compared EKG to his previous EKG last August which looks similar however it is distinctly different from the EKG  last of February.  He is asymptomatic, but not anticoagulated.  Otherwise, labs reviewed from October indicate total cholesterol 134, HDL 36 LDL 67 and triglycerides 157.  Hemoglobin A1c of 7.6 and serum creatinine 0.9.  08/30/2017  [name] [name] returns today for follow-up of his studies.  He underwent an echocardiogram to evaluate for change in LVEF, but more importantly to rule out atrial flutter.  His EKG was abnormal and suggested possible atrial flutter, however he was asymptomatic.  The echo did not show any evidence of atrial flutter.  LVEF was stable at 40%.  He also underwent a home 48-hour Holter monitor.  This demonstrated sinus rhythm with PACs and PVCs, but no evidence of atrial fibrillation or flutter.  Remains asymptomatic.  His only other complaint today is chronic leg pain.  He was seen by Dr. Gwenlyn Found and evaluated with lower extremity arterial Dopplers.  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  A referral to neurosurgery was suggested but not placed.  01/24/2018  Mr. Schoen returns today for follow-up.  Overall he says he is doing really well.  He underwent 2 back injections and has had improvement in his back pain and leg pain.  He had an episode in June where he became somewhat dehydrated.  He presented to the ER with orthostatic hypotension.  His Lasix was cut back to 20 mg daily.  Weight is stayed stable effect is been improved from 252-246.  He says he is actually had  enough energy to play golf recently.  08/01/2018  Mr. Higginson seen today in routine follow-up.  He has some occasional dizziness which is persistent.  He says he gets a little lightheaded when he urinates.  He has been off of tamsulosin with no specific benefit and now is taking the medication every third day.  Blood pressure is stable.  Weight is stable.  He denies any chest pain or worsening shortness of breath with exertion.  His last echo showed an EF of 40% which is stable if not slightly improved from an echo in 2017.  Unfortunately he could not tolerate Entresto and remains on carvedilol.  He endorses NYHA class I-II heart failure symptoms.  Recent lab work showed total cholesterol 151, HDL 39, LDL 79 triglycerides 166.  Hemoglobin A1c of 7.3.  PMHx:  Past Medical History:  Diagnosis Date  . Arthritis    Knee L - probably  . BPH (benign prostatic hyperplasia)   . CAD (coronary artery disease) 07/06/2011  . CHF (congestive heart failure) ([place])   . Coronary artery disease   . Diabetes mellitus   . Frequency of urination   . High cholesterol   . History of nuclear stress test 09/2010   dipyridamole; mild perfusion defect due to attenuation with mild superimposed ischemia in apical septal, apical, apical inferior & apical lateral regions; rest LV enlarged in size; prominent gut uptake in infero-apical region; no significant ischemia demonstrated; low risk scan   . HNP (herniated nucleus pulposus), lumbar   . Ischemic cardiomyopathy   . LV dysfunction 07/06/2011  . Nocturia   . Right bundle branch block   . 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 Ellison Hughs  . Sleep apnea    sleep study 10/2010- AHI during total sleep 32.1/hr and during REM 62.3/hr (severe sleep apnea)unable to tolerate c pap  . Spinal stenosis of lumbar  region   . Stroke [facility])    [place]     Past Surgical History:  Procedure Laterality  Date  . CARDIAC CATHETERIZATION  01/2011   ischemic cardiomyopathy 30-35%, multivessel CAD (Dr. [name])   . CARDIAC CATHETERIZATION  09/13/2015   Procedure: Right/Left Heart Cath and Coronary/Graft Angiography;  Surgeon: [name], MD;  Location: [place] [facility];  Service: Cardiovascular;;  . Colonscopy    . CORONARY ARTERY BYPASS GRAFT  08/03/2011   Procedure: CORONARY ARTERY BYPASS GRAFTING (CABG);  Surgeon: [name], MD;  Location: [facility];  Service: Open Heart Surgery;  Laterality: N/A;  CABG times three using right saphenous vein and left mammary artery usisng endoscope.  Marland Kitchen LEFT HEART CATHETERIZATION WITH CORONARY/GRAFT ANGIOGRAM N/A 09/20/2013   Procedure: LEFT HEART CATHETERIZATION WITH [name];  Surgeon: [name], MD;  Location: [facility] C[facility];  Service: Cardiovascular;  Laterality: N/A;  . Lower Extremity Arterial Doppler  03/16/2013   bilat ABIs demonstated normal values; R runoff - posterior tibial & anterior tibial arteries occluded; L runoff - peroneal & posterior tibial arteries occluded, anterior tibial artery appears occluded  . LUMBAR LAMINECTOMY/DECOMPRESSION MICRODISCECTOMY N/A 09/12/2014   Procedure: LUMBAR DECOMPRESSION L3-L4, L4-L5, MICRODISCECTOMY L4-L5;  Surgeon: [name], MD;  Location: [facility];  Service: Orthopedics;  Laterality: N/A;  . Pilonidal Cyst removed    . TRANSTHORACIC ECHOCARDIOGRAM  11/10/2012   EF 40-45%, mild LVH, mild hypokinesis of anteroseptal myocardium, grade 1 diastolic dysfunction; mild MR & calcifed mitral annulus; LA mild-mod dilated; RA mildly dilated    FAMHx:  Family History  Problem Relation Age of Onset  . Anesthesia problems Daughter   . Cancer Mother   . Lung disease Father        & heart disease  . Colon cancer Sister   . Sudden death Maternal Grandmother     SOCHx:   reports that he quit smoking about 6 years ago. His smoking use included cigars. He quit after 5.00 years of use. He quit smokeless  tobacco use about 6 years ago. He reports current alcohol use. He reports that he does not use drugs.  ALLERGIES:  Allergies  Allergen Reactions  . Entresto [Sacubitril-Valsartan] Other (See Comments)    dizziness    ROS: Pertinent items noted in HPI and remainder of comprehensive ROS otherwise negative.  HOME MEDS: Current Outpatient Medications  Medication Sig Dispense Refill  . aspirin EC 81 MG tablet Take 81 mg by mouth daily.    . carvedilol (COREG) 6.25 MG tablet TAKE 1 TABLET BY MOUTH TWICE DAILY 180 tablet 1  . furosemide (LASIX) 40 MG tablet Take 20 mg by mouth daily.    Marland Kitchen gabapentin (NEURONTIN) 300 MG capsule Take 300 mg by mouth daily.     Marland Kitchen glipiZIDE (GLUCOTROL XL) 10 MG 24 hr tablet Take 10 mg by mouth every morning.     . metFORMIN (GLUCOPHAGE-XR) 500 MG 24 hr tablet Take 1,000 mg by mouth 2 (two) times daily.    . Multiple Vitamin (MULTIVITAMIN) tablet Take 1 tablet by mouth daily.    . polycarbophil (FIBERCON) 625 MG tablet Take 625 mg by mouth as needed for mild constipation or moderate constipation.     . rosuvastatin (CRESTOR) 20 MG tablet Take 10 mg by mouth every other day.     . spironolactone (ALDACTONE) 25 MG tablet TAKE 1/2 (ONE-HALF) TABLET BY MOUTH ONCE DAILY 45 tablet 2  . tamsulosin (FLOMAX) 0.4 MG CAPS capsule Take  1 capsule by mouth every evening.     . TRADJENTA 5 MG TABS tablet Take 5 mg by mouth daily.    . Cholecalciferol (VITAMIN D-3 PO) Take 500 Units by mouth daily.     No current facility-administered medications for this visit.     LABS/IMAGING: No results found for this or any previous visit (from the past 48 hour(s)). No results found.  VITALS: BP (!) 116/58   Pulse (!) 59   Ht 6\' 2"  (1.88 m)   Wt 246 lb (111.6 kg)   BMI 31.58 kg/m   EXAM: General appearance: alert and no distress Neck: no carotid bruit, no JVD and thyroid not enlarged, symmetric, no tenderness/mass/nodules Lungs: clear to auscultation bilaterally Heart:  regular rate and rhythm, S1, S2 normal, no murmur, click, rub or gallop Abdomen: soft, non-tender; bowel sounds normal; no masses,  no organomegaly Extremities: extremities normal, atraumatic, no cyanosis or edema Pulses: 2+ and symmetric Skin: Skin color, texture, turgor normal. No rashes or lesions Neurologic: Grossly normal Psych: Pleasant  EKG: Sinus bradycardia with first-degree AV block, RBBB and LAFB at 59-personally reviewed  ASSESSMENT: 1. Chronic systolic congestive heart failure - EF 40% (07/2017) 2. Coronary artery disease status post three-vessel CABG in 2013 (LIMA to LAD, SVG to PDA and SVG to PLA) - patent grafts by cath in 08/2015 3. Hypertension-controlled 4. Dyslipidemia 5. Diabetes type 2 - better controlled 6. Mild PVD-with normal ABI 7. Hypotension/upper back pain - resolved 8. Spinal stenosis - s/p surgery 9. Trifascicular block 10. OSA-back on BiPAP  PLAN: 1.   Mr. Campione has no new complaints today.  He has stable NYHA class I-II heart failure symptoms.  He denies any anginal pain.  EF is been fairly stable around 40%.  He cannot tolerate Entresto and blood pressure would not support ACE or ARB.  He remains on beta-blocker.  Overall he is doing well with minimal complaints.  Follow-up in 6 months.   [name], MD, [facility], [place] Director of the Advanced Lipid Disorders &  Cardiovascular Risk Reduction Clinic Diplomate of the American Board of Clinical Lipidology Attending Cardiologist  Direct Dial: [phone]  Fax: [phone]  Website:  [email or web address]  08/01/2018, 8:13 AM

## 2018-08-01 NOTE — Patient Instructions (Signed)
Medication Instructions:  Continue current medications If you need a refill on your cardiac medications before your next appointment, please call your pharmacy.   Follow-Up: At [facility], you and your health needs are our priority.  As part of our continuing mission to provide you with exceptional heart care, we have created designated Provider Care Teams.  These Care Teams include your primary Cardiologist (physician) and Advanced Practice Providers (APPs -  Physician Assistants and Nurse Practitioners) who all work together to provide you with the care you need, when you need it. You will need a follow up appointment in 6 months.  Please call our office 2 months in advance to schedule this appointment.  You may see Kenneth C [name], MD or one of the following Advanced Practice Providers on your designated Care Team: Hao Meng, PA-C . Angela Duke, PA-C  Any Other Special Instructions Will Be Listed Below (If Applicable).    

## 2018-08-03 DIAGNOSIS — K5901 Slow transit constipation: Secondary | ICD-10-CM | POA: Diagnosis not present

## 2018-08-03 DIAGNOSIS — E782 Mixed hyperlipidemia: Secondary | ICD-10-CM | POA: Diagnosis not present

## 2018-08-03 DIAGNOSIS — I10 Essential (primary) hypertension: Secondary | ICD-10-CM | POA: Diagnosis not present

## 2018-08-03 DIAGNOSIS — I255 Ischemic cardiomyopathy: Secondary | ICD-10-CM | POA: Diagnosis not present

## 2018-08-03 DIAGNOSIS — E1165 Type 2 diabetes mellitus with hyperglycemia: Secondary | ICD-10-CM | POA: Diagnosis not present

## 2018-08-03 DIAGNOSIS — I5022 Chronic systolic (congestive) heart failure: Secondary | ICD-10-CM | POA: Diagnosis not present

## 2018-08-03 DIAGNOSIS — Z6831 Body mass index (BMI) 31.0-31.9, adult: Secondary | ICD-10-CM | POA: Diagnosis not present

## 2018-08-03 DIAGNOSIS — I5033 Acute on chronic diastolic (congestive) heart failure: Secondary | ICD-10-CM | POA: Diagnosis not present

## 2018-09-21 ENCOUNTER — Other Ambulatory Visit: Payer: Self-pay | Admitting: Internal Medicine

## 2018-09-21 ENCOUNTER — Other Ambulatory Visit: Payer: Self-pay | Admitting: Cardiovascular Disease

## 2018-11-28 DIAGNOSIS — M722 Plantar fascial fibromatosis: Secondary | ICD-10-CM | POA: Diagnosis not present

## 2018-11-28 DIAGNOSIS — M7732 Calcaneal spur, left foot: Secondary | ICD-10-CM | POA: Diagnosis not present

## 2018-11-28 DIAGNOSIS — E119 Type 2 diabetes mellitus without complications: Secondary | ICD-10-CM | POA: Diagnosis not present

## 2018-11-28 DIAGNOSIS — M79672 Pain in left foot: Secondary | ICD-10-CM | POA: Diagnosis not present

## 2018-11-28 DIAGNOSIS — E785 Hyperlipidemia, unspecified: Secondary | ICD-10-CM | POA: Diagnosis not present

## 2018-11-29 DIAGNOSIS — F5221 Male erectile disorder: Secondary | ICD-10-CM | POA: Diagnosis not present

## 2018-11-29 DIAGNOSIS — F411 Generalized anxiety disorder: Secondary | ICD-10-CM | POA: Diagnosis not present

## 2018-11-29 DIAGNOSIS — E1165 Type 2 diabetes mellitus with hyperglycemia: Secondary | ICD-10-CM | POA: Diagnosis not present

## 2018-11-29 DIAGNOSIS — E782 Mixed hyperlipidemia: Secondary | ICD-10-CM | POA: Diagnosis not present

## 2018-11-29 DIAGNOSIS — E7800 Pure hypercholesterolemia, unspecified: Secondary | ICD-10-CM | POA: Diagnosis not present

## 2018-12-02 DIAGNOSIS — F411 Generalized anxiety disorder: Secondary | ICD-10-CM | POA: Diagnosis not present

## 2018-12-02 DIAGNOSIS — Z6830 Body mass index (BMI) 30.0-30.9, adult: Secondary | ICD-10-CM | POA: Diagnosis not present

## 2018-12-02 DIAGNOSIS — N401 Enlarged prostate with lower urinary tract symptoms: Secondary | ICD-10-CM | POA: Diagnosis not present

## 2018-12-02 DIAGNOSIS — E1165 Type 2 diabetes mellitus with hyperglycemia: Secondary | ICD-10-CM | POA: Diagnosis not present

## 2018-12-02 DIAGNOSIS — I5022 Chronic systolic (congestive) heart failure: Secondary | ICD-10-CM | POA: Diagnosis not present

## 2018-12-02 DIAGNOSIS — R42 Dizziness and giddiness: Secondary | ICD-10-CM | POA: Diagnosis not present

## 2018-12-02 DIAGNOSIS — I5033 Acute on chronic diastolic (congestive) heart failure: Secondary | ICD-10-CM | POA: Diagnosis not present

## 2018-12-02 DIAGNOSIS — I10 Essential (primary) hypertension: Secondary | ICD-10-CM | POA: Diagnosis not present

## 2018-12-19 DIAGNOSIS — M79672 Pain in left foot: Secondary | ICD-10-CM | POA: Diagnosis not present

## 2018-12-19 DIAGNOSIS — M722 Plantar fascial fibromatosis: Secondary | ICD-10-CM | POA: Diagnosis not present

## 2018-12-19 DIAGNOSIS — M7732 Calcaneal spur, left foot: Secondary | ICD-10-CM | POA: Diagnosis not present

## 2019-01-02 DIAGNOSIS — L110 Acquired keratosis follicularis: Secondary | ICD-10-CM | POA: Diagnosis not present

## 2019-01-02 DIAGNOSIS — M79671 Pain in right foot: Secondary | ICD-10-CM | POA: Diagnosis not present

## 2019-01-02 DIAGNOSIS — M79672 Pain in left foot: Secondary | ICD-10-CM | POA: Diagnosis not present

## 2019-01-02 DIAGNOSIS — E1140 Type 2 diabetes mellitus with diabetic neuropathy, unspecified: Secondary | ICD-10-CM | POA: Diagnosis not present

## 2019-01-02 DIAGNOSIS — I739 Peripheral vascular disease, unspecified: Secondary | ICD-10-CM | POA: Diagnosis not present

## 2019-01-23 DIAGNOSIS — M79672 Pain in left foot: Secondary | ICD-10-CM | POA: Diagnosis not present

## 2019-01-23 DIAGNOSIS — M7732 Calcaneal spur, left foot: Secondary | ICD-10-CM | POA: Diagnosis not present

## 2019-01-23 DIAGNOSIS — M722 Plantar fascial fibromatosis: Secondary | ICD-10-CM | POA: Diagnosis not present

## 2019-02-17 ENCOUNTER — Telehealth: Payer: Self-pay | Admitting: Internal Medicine

## 2019-02-17 NOTE — Telephone Encounter (Signed)
LM to inform patient that his 03/01/2019 visit is on a [place]. He can keep this appointment and change appointment type to virtual - phone/video OR he can reschedule for MD first available in office.

## 2019-02-20 DIAGNOSIS — M79672 Pain in left foot: Secondary | ICD-10-CM | POA: Diagnosis not present

## 2019-02-20 DIAGNOSIS — M722 Plantar fascial fibromatosis: Secondary | ICD-10-CM | POA: Diagnosis not present

## 2019-02-20 DIAGNOSIS — M7732 Calcaneal spur, left foot: Secondary | ICD-10-CM | POA: Diagnosis not present

## 2019-03-01 ENCOUNTER — Ambulatory Visit: Payer: Medicare Other | Admitting: Internal Medicine

## 2019-03-13 DIAGNOSIS — I739 Peripheral vascular disease, unspecified: Secondary | ICD-10-CM | POA: Diagnosis not present

## 2019-03-13 DIAGNOSIS — E1140 Type 2 diabetes mellitus with diabetic neuropathy, unspecified: Secondary | ICD-10-CM | POA: Diagnosis not present

## 2019-03-13 DIAGNOSIS — M79671 Pain in right foot: Secondary | ICD-10-CM | POA: Diagnosis not present

## 2019-03-13 DIAGNOSIS — M79672 Pain in left foot: Secondary | ICD-10-CM | POA: Diagnosis not present

## 2019-03-30 ENCOUNTER — Other Ambulatory Visit: Payer: Self-pay

## 2019-03-30 ENCOUNTER — Ambulatory Visit (INDEPENDENT_AMBULATORY_CARE_PROVIDER_SITE_OTHER): Payer: Medicare Other | Admitting: Internal Medicine

## 2019-03-30 ENCOUNTER — Encounter: Payer: Self-pay | Admitting: Internal Medicine

## 2019-03-30 VITALS — BP 122/70 | HR 72 | Temp 97.2°F | Ht 74.0 in | Wt 243.0 lb

## 2019-03-30 DIAGNOSIS — I10 Essential (primary) hypertension: Secondary | ICD-10-CM | POA: Diagnosis not present

## 2019-03-30 DIAGNOSIS — I255 Ischemic cardiomyopathy: Secondary | ICD-10-CM

## 2019-03-30 DIAGNOSIS — Z951 Presence of aortocoronary bypass graft: Secondary | ICD-10-CM

## 2019-03-30 DIAGNOSIS — I4891 Unspecified atrial fibrillation: Secondary | ICD-10-CM

## 2019-03-30 MED ORDER — APIXABAN 5 MG PO TABS: 5.0000 mg | ORAL_TABLET | Freq: Two times a day (BID) | ORAL | 11 refills | Status: DC

## 2019-03-30 NOTE — Patient Instructions (Signed)
Medication Instructions:  START Eliquis 5mg  twice daily Continue other current medications  If you need a refill on your cardiac medications before your next appointment, please call your pharmacy.    Follow-Up: At [facility], you and your health needs are our priority.  As part of our continuing mission to provide you with exceptional heart care, we have created designated Provider Care Teams.  These Care Teams include your primary Cardiologist (physician) and Advanced Practice Providers (APPs -  Physician Assistants and Nurse Practitioners) who all work together to provide you with the care you need, when you need it. . You will need a follow up appointment in 4 weeks in the office with EKG with Dr. Debara Pickett - OK to double book  Any Other Special Instructions Will Be Listed Below (If Applicable).

## 2019-03-30 NOTE — Progress Notes (Signed)
OFFICE NOTE  Chief Complaint:  No complaints  Primary Care Physician: [name] [name], MD  HPI:  [name] [name] is a pleasant 73 year old gentleman previously followed by Dr. Rollene Fare. His past medical history is significant for coronary artery disease. In 2013 he underwent multivessel CABG for an ischemic cardiomyopathy. EF was 20-25% prior to surgery however post surgery his EF had improved up to 45-50%. Recently his EF was 40-45% by echo in May of 2014. There was mild mitral regurgitation, mild to moderately dilated left atrium and mild LVH. [name] [name] has been describing some anxiety. He also reports some pain in his legs however underwent Dopplers in September 2014 which showed preserved ABIs bilaterally a 1.0 on the right and 1.1 on the left. The bilateral peroneal and posterior tibial arteries were occluded. He reports some optimal control of his diabetes. His A1c was 7.2. He is concerned about the cost of taking Tradjenta. He is followed by Dr. Elyse Hsu.   He was recently seen in the emergency room and he can hospital. There he presented with hypotension and was given 1 L normal saline and his symptoms improved. He had been having dizziness as well as some upper back pain into the left shoulder blade with exertion recently. The symptoms are worse particularly when climbing up ladders or going up stairs and are relieved at rest. After that hospitalization it was recommended that he discontinue his ACE inhibitor and beta blocker, and his blood pressure appears improved today up to 110/60.  [name] [name] returns today for followup of his stress test. This is interpreted as nonischemic with an EF of 44%. He reports still feeling very fatigued and extremely short of breath when doing activities. He says that the other day he tried to hit golf balls and felt that he was totally exhausted after just an hour. He reports his blood pressure has improved somewhat off of all medications however  remains low. He has had symptoms of orthostatic hypotension in the past. His symptoms do feel similar to prior to his bypass surgery. He also feels that he was much better after surgery than he is now and that his symptoms have been going on for the past 2 months.  At his last office visit, I recommended that [name] [name] have a repeat cardiac catheterization (08/2013). This demonstrated the following:  Impression:  1. 2 vessel native CAD with occluded LAD in the mid-vessel  2. Patent LIMA-LAD, SVG to PLA and SVG to PDA grafts with TIMI III flow  3. LVEDP = 13 mmHg  4. Random PVC's were noted  Based on these findings, I could not find any new cardiac cause of his symptoms. I suspect that some of his fatigue could be related to labile blood sugars. He says unexplained hypotension but is normotensive off of medications.  [name] [name] returns today for followup. He now reports feeling better since he had change in his medications. He is established with Dr. Buddy Duty who adjusted his diabetes medicines and stopped his Invokana. His shortness of breath and fatigue in both improved. He is now been expected some problems with left heel pain. He was found to have some spinal stenosis but has had improvement with inserts in his shoes.  I saw [name] [name] back in the office today. He has successfully underwent back surgery earlier this year which she says initially was much improved, however he says he subsequently developed more pain going down his legs. This sounds like it's  neuropathic pain, but is reluctant to try medication for it. He also significantly fatigue. This could be due to a number of etiologies. 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 Also, he is noted to have a history of obstructive sleep apnea. He was placed on BiPAP, but has not been compliant with that due to difficulty wearing the mask. He also  never had follow-up of his sleep study.  [name] [name] returns today for follow-up. He reports he's had worsening dyspnea and fatigue. He says that he can hardly sleep at night. He is struggling with significant anxiety. 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 He's not been wearing the BiPAP due to significant anxiety and difficulty sleeping. He feels like he gets smothered with his machine. Communication between Dr. Radford Pax and Dr. Quintin Alto led to the starting of Celexa as well as Klonopin to use as needed at night. He says that it does give him about 6 hours of sleep. He has not been using his BiPAP as instructed. He is reportedly had more worsening shortness of breath. He was seen in the ER for this and it was thought this was due [email or web address] with BiPAP. His BNP however is elevated over 300. Today in the office he says that he's had more swelling and more abdominal fullness as well as leg edema.  I had the pleasure seeing [name] [name] back today in the office. He seems to respond nicely to Lasix. I started him on 40 mg daily and while I was out of town my partner reviewed his lab work which included an elevated BNP over 300. He increased his Lasix to 40 mg twice a day. [name] [name] says he's had a significant improvement in his breathing. The BNP now is in the mid 200s. Renal function is stable based on labs today. Unfortunately, his echocardiogram does show a new cardiomyopathy with EF 30-35%. It should be noted that he felt "awful" on blood pressure/heart failure medicines and had taken himself off of ACE inhibitor and beta blocker in the past. The echo however does suggest inferior and anterolateral wall motion abnormalities which are new and could indicate graft dysfunction. I performed his last heart catheterization in 2015 which showed all 3 grafts patent.  [name] [name] returns today for follow-up of his left heart catheterization. I perform  this a few weeks ago and he had patent bypass grafts however LV function is reduced. Cardiac output is also reduced. Filling pressures were mildly elevated. He reports since discharge that he's had some improvement of his symptoms. He was started on low-dose beta blocker but he does report fatigue. When I asked him how they compared to previously he said he thinks attack sleep better since he started on the beta blocker but not back to what he thinks his baseline should be. He continues to have problems with left leg pain. He had ultrasound of the left leg which were unrevealing. He also had nerve conduction studies which I sent him for which were not suggestive of neuropathy. His symptoms were worse while lying flat on the Cath Lab table and I suspect this could be again coming from his back and he may need repeat orthopedic evaluation. His primary care provider started him on Celexa recently as well for anxiety.  12/23/2015  [name] [name] was seen back in the office  today in follow-up. Overall he seems to be doing well. He's tolerating low-dose carvedilol and has had some very infrequent morning dizziness. He was previously on Lasix 40 mg twice a day but ran out of the medicine about 8 days ago. He's not had any worsening weight gain or swelling nor worsening shortness of breath over the past week. This could be that he's had some improvement in his cardiomyopathy. Nevertheless, I would like him to be on some Lasix at least until we can determine whether or not he is euvolemic with lab work.  05/27/2016  [name] [name] returns today for follow-up. He's gained about 6 pounds of weight since I last saw him. He denies any worsening shortness of breath or orthostatic dizziness. He saw Dr. Radford Pax in August for following of his obstructive sleep apnea. He remains physically active and denies any chest pain. He recently had a repeat echo in October 2017 which showed a small improvement in LV function with EF up to  35-40%. Heart failure regimen includes aspirin, carvedilol, Crestor, and spironolactone. He is not currently on a ARB is he's had orthostatic symptoms in the past although that his generally resolved. His creatinine is normal.  08/24/2016  I saw [name] [name] today in follow-up. He again gained about 4 pounds over the past month. He felt that the weight gain was heart failure related because he got more short of breath particularly walking up stairs. Based on that he increased his Lasix back to 40 mg daily. He notes that his urine is been darker and his urination is not necessarily improved. He's also been more dizzy. He says he gets somewhat presyncopal with change in position. Lab work 12 days ago indicated a stable creatinine however. Despite this, I feel that he may be somewhat presyncopal perhaps on too high of a dose of diuretics. He is also on Entresto 24/26 and Aldactone 12.5 mg daily. He also complained of some pain across the back between the shoulder blades. He said this got better with resting and drinking water. It's difficult to say whether that it was the water or perhaps resting from his activities. I cannot exclude that this could be angina. His EKG however is reassuring today.  09/21/2016  [name] [name] was seen today in follow-up. I decreased his Lasix, however he does not feel any change in his dizziness. There is no evidence of presyncope. He has gained about 4 pounds which I suspect is some fluid retention. Blood pressure is well-controlled today 118/56.  02/26/2017  [name] [name] returns today for follow-up. He had more recent dizziness and it seemed to worsen on Entresto. He discontinued that and feels that he's now much better. Overall he says he feels well. He is not on ACE inhibitor or ARB but remains on carvedilol. Is also on Lasix 40 mg daily. Blood pressure is stable 122/52.  08/10/2017  [name] [name] returns for follow-up.  Overall he seems to be feeling very well.  Denies any chest  pain or worsening shortness of breath.  He gets occasional dizziness but that is much improved.  Of note an EKG was performed again today and there is a suggestion that there may be underlying atrial flutter.  Heart rate was 74 and EKG looks very similar to his prior EKG.  The computer has interpreted a sinus rhythm with first-degree AV block and bifascicular block.  I compared EKG to his previous EKG last August which looks similar however it is distinctly different from the EKG  last of February.  He is asymptomatic, but not anticoagulated.  Otherwise, labs reviewed from October indicate total cholesterol 134, HDL 36 LDL 67 and triglycerides 157.  Hemoglobin A1c of 7.6 and serum creatinine 0.9.  08/30/2017  [name] [name] returns today for follow-up of his studies.  He underwent an echocardiogram to evaluate for change in LVEF, but more importantly to rule out atrial flutter.  His EKG was abnormal and suggested possible atrial flutter, however he was asymptomatic.  The echo did not show any evidence of atrial flutter.  LVEF was stable at 40%.  He also underwent a home 48-hour Holter monitor.  This demonstrated sinus rhythm with PACs and PVCs, but no evidence of atrial fibrillation or flutter.  Remains asymptomatic.  His only other complaint today is chronic leg pain.  He was seen by Dr. Gwenlyn Found and evaluated with lower extremity arterial Dopplers.  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  A referral to neurosurgery was suggested but not placed.  01/24/2018  [name] [name] returns today for follow-up.  Overall he says he is doing really well.  He underwent 2 back injections and has had improvement in his back pain and leg pain.  He had an episode in June where he became somewhat dehydrated.  He presented to the ER with orthostatic hypotension.  His Lasix was cut back to 20 mg daily.  Weight is stayed stable effect is been improved from 252-246.  He says he is actually had  enough energy to play golf recently.  08/01/2018  Mr. Masley seen today in routine follow-up.  He has some occasional dizziness which is persistent.  He says he gets a little lightheaded when he urinates.  He has been off of tamsulosin with no specific benefit and now is taking the medication every third day.  Blood pressure is stable.  Weight is stable.  He denies any chest pain or worsening shortness of breath with exertion.  His last echo showed an EF of 40% which is stable if not slightly improved from an echo in 2017.  Unfortunately he could not tolerate Entresto and remains on carvedilol.  He endorses NYHA class I-II heart failure symptoms.  Recent lab work showed total cholesterol 151, HDL 39, LDL 79 triglycerides 166.  Hemoglobin A1c of 7.3.  03/30/2019  Mr. Sison seen today in follow-up.  Overall he is doing well and has no complaints.  He has not recently struggled with any of the dizziness he has had previously.  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  In the past he has had EKG changes that were questionable for possible atrial flutter however monitoring as well as an echocardiogram failed to show any evidence of atrial flutter or fibrillation.  This is now clear finding of atrial fibrillation on EKG with an irregularly irregular rhythm with no evidence of P waves.  He reports being asymptomatic with this.  PMHx:  Past Medical History:  Diagnosis Date   Arthritis    Knee L - probably   BPH (benign prostatic hyperplasia)    CAD (coronary artery disease) 07/06/2011   CHF (congestive heart failure) (HCC)    Coronary artery disease    Diabetes mellitus    Frequency of urination    High cholesterol    History of nuclear stress test 09/2010   dipyridamole; mild perfusion defect due to attenuation with mild superimposed ischemia in apical septal, apical, apical inferior &  apical lateral regions; rest LV enlarged in size;  prominent gut uptake in infero-apical region; no significant ischemia demonstrated; low risk scan    HNP (herniated nucleus pulposus), lumbar    Ischemic cardiomyopathy    LV dysfunction 07/06/2011   Nocturia    Right bundle branch block    S/P CABG (coronary artery bypass graft) 08/03/2011   x3; LIMA to LAD,, SVG to PDA, SVG to posterolateral branch of RCA; Dr. Ellison Hughs   Sleep apnea    sleep study 10/2010- AHI during total sleep 32.1/hr and during REM 62.3/hr (severe sleep apnea)unable to tolerate c pap   Spinal stenosis of lumbar region    [facility]roke [facility])    [place]     Past Surgical History:  Procedure Laterality Date   CARDIAC CATHETERIZATION  01/2011   ischemic cardiomyopathy 30-35%, multivessel CAD (Dr. [name])    CARDIAC CATHETERIZATION  09/13/2015   Procedure: Right/Left Heart Cath and Coronary/Graft Angiography;  Surgeon: [name], MD;  Location: [facility];  Service: Cardiovascular;;   Colonscopy     CORONARY ARTERY BYPASS GRAFT  08/03/2011   Procedure: CORONARY ARTERY BYPASS GRAFTING (CABG);  Surgeon: [name], MD;  Location: [place];  Service: Open Heart Surgery;  Laterality: N/A;  CABG times three using right saphenous vein and left mammary artery usisng endoscope.   LEFT HEART CATHETERIZATION WITH CORONARY/GRAFT ANGIOGRAM N/A 09/20/2013   Procedure: LEFT HEART CATHETERIZATION WITH [name];  Surgeon: [name], MD;  Location[facility] C[facility];  Service: Cardiovascular;  Laterality: N/A;   Lower Extremity Arterial Doppler  03/16/2013   bilat ABIs demonstated normal values; R runoff - posterior tibial & anterior tibial arteries occluded; L runoff - peroneal & posterior tibial arteries occluded, anterior tibial artery appears occluded   LUMBAR LAMINECTOMY/DECOMPRESSION MICRODISCECTOMY N/A 09/12/2014   Procedure: LUMBAR DECOMPRESSION L3-L4, L4-L5, MICRODISCECTOMY L4-L5;  Surgeon: [name], MD;  Location: [facility];  Service:  Orthopedics;  Laterality: N/A;   Pilonidal Cyst removed     TRANSTHORACIC ECHOCARDIOGRAM  11/10/2012   EF 40-45%, mild LVH, mild hypokinesis of anteroseptal myocardium, grade 1 diastolic dysfunction; mild MR & calcifed mitral annulus; LA mild-mod dilated; RA mildly dilated    FAMHx:  Family History  Problem Relation Age of Onset   Anesthesia problems Daughter    Cancer Mother    Lung disease Father        & heart disease   Colon cancer Sister    Sudden death Maternal Grandmother     SOCHx:   reports that he quit smoking about 7 years ago. His smoking use included cigars. He quit after 5.00 years of use. He quit smokeless tobacco use about 7 years ago. He reports current alcohol use. He reports that he does not use drugs.  ALLERGIES:  Allergies  Allergen Reactions   Entresto [Sacubitril-Valsartan] Other (See Comments)    dizziness    ROS: Pertinent items noted in HPI and remainder of comprehensive ROS otherwise negative.  HOME MEDS: Current Outpatient Medications  Medication Sig Dispense Refill   Alogliptin Benzoate 12.5 MG TABS Take 1 tablet by mouth daily.     aspirin EC 81 MG tablet Take 81 mg by mouth daily.     carvedilol (COREG) 6.25 MG tablet Take 1 tablet by mouth twice daily 180 tablet 3   Cholecalciferol (VITAMIN D-3 PO) Take 500 Units by mouth daily.     furosemide (LASIX) 40 MG tablet Take 20 mg by mouth daily.  gabapentin (NEURONTIN) 300 MG capsule Take 300 mg by mouth daily.      glipiZIDE (GLUCOTROL XL) 10 MG 24 hr tablet Take 10 mg by mouth every morning.      metFORMIN (GLUCOPHAGE-XR) 500 MG 24 hr tablet Take 1,000 mg by mouth 2 (two) times daily.     Multiple Vitamin (MULTIVITAMIN) tablet Take 1 tablet by mouth daily.     polycarbophil (FIBERCON) 625 MG tablet Take 625 mg by mouth as needed for mild constipation or moderate constipation.      rosuvastatin (CRESTOR) 20 MG tablet Take 10 mg by mouth every other day.      spironolactone  (ALDACTONE) 25 MG tablet Take 1/2 (one-half) tablet by mouth once daily 45 tablet 3   tamsulosin (FLOMAX) 0.4 MG CAPS capsule Take 1 capsule by mouth every evening.      No current facility-administered medications for this visit.     LABS/IMAGING: No results found for this or any previous visit (from the past 48 hour(s)). No results found.  VITALS: BP 122/70    Pulse 72    Temp (!) 97.2 F (36.2 C) (Temporal)    Ht 6\' 2"  (1.88 m)    Wt 243 lb (110.2 kg)    SpO2 96%    BMI 31.20 kg/m   EXAM: General appearance: alert and no distress Neck: no carotid bruit, no JVD and thyroid not enlarged, symmetric, no tenderness/mass/nodules Lungs: clear to auscultation bilaterally Heart: irregularly irregular rhythm Abdomen: soft, non-tender; bowel sounds normal; no masses,  no organomegaly Extremities: extremities normal, atraumatic, no cyanosis or edema Pulses: 2+ and symmetric Skin: Skin color, texture, turgor normal. No rashes or lesions Neurologic: Grossly normal Psych: Pleasant  EKG: Atrial fibrillation with bifascicular block at 72-personally reviewed  ASSESSMENT: 1. New onset atrial fibrillation, CHADSVASC score of 5 2. Chronic systolic congestive heart failure - EF 40% (07/2017) 3. Coronary artery disease status post three-vessel CABG in 2013 (LIMA to LAD, SVG to PDA and SVG to PLA) - patent grafts by cath in 08/2015 4. Hypertension-controlled 5. Dyslipidemia 6. Diabetes type 2 - better controlled 7. Mild PVD-with normal ABI 8. Hypotension/upper back pain - resolved 9. 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 OSA-back on BiPAP  PLAN: 1.   Mr. Deal is noted to have new onset atrial fibrillation today on EKG.  He seems to be asymptomatic with this.  Given his history of heart failure however I think it is reasonable to try to get him back in a sinus rhythm as he would likely have better cardiac performance.  His CHADSVASC score is 5, and therefore he should be  anticoagulated.  He is on low-dose aspirin.  I recommend adding Eliquis 5 mg twice daily.  We will plan a minimum of 3 weeks of anticoagulation and then repeat an EKG.  If he is persistently in atrial fibrillation, then will schedule elective cardioversion.  He is agreeable to this plan.  [name], MD, [facility], [place] Director of the [facility]  [facility] Diplomate of the American Board of Clinical Lipidology Attending Cardiologist  Direct Dial: [phone]   Fax: [phone]  Website:  [email or web address] 03/30/2019, 4:33 PM

## 2019-03-31 ENCOUNTER — Telehealth: Payer: Self-pay | Admitting: Internal Medicine

## 2019-03-31 NOTE — Telephone Encounter (Signed)
Daughter reports patient had 5 hour energy drink yesterday. He occasionally drink energy drinks. Explained this would not cause AF but can increase his HR. She has advised him against these beverages.   Daughter is concerned about new AF diagnosis, eliquis.   Explained to her the rhythm and rationale for new medication and potential treatment plan (cardioversion). Explained important of med adherence  Notified her that she can come to next visit, if she wishes.

## 2019-03-31 NOTE — Telephone Encounter (Signed)
Follow Up:   Pt was seen yesterday by Dr Debara Pickett. [name] have some questions about pt's diagnosis and medicine please.

## 2019-04-04 DIAGNOSIS — G4733 Obstructive sleep apnea (adult) (pediatric): Secondary | ICD-10-CM | POA: Diagnosis not present

## 2019-04-04 DIAGNOSIS — E782 Mixed hyperlipidemia: Secondary | ICD-10-CM | POA: Diagnosis not present

## 2019-04-04 DIAGNOSIS — E1140 Type 2 diabetes mellitus with diabetic neuropathy, unspecified: Secondary | ICD-10-CM | POA: Diagnosis not present

## 2019-04-04 DIAGNOSIS — E1165 Type 2 diabetes mellitus with hyperglycemia: Secondary | ICD-10-CM | POA: Diagnosis not present

## 2019-04-04 DIAGNOSIS — E7800 Pure hypercholesterolemia, unspecified: Secondary | ICD-10-CM | POA: Diagnosis not present

## 2019-04-04 DIAGNOSIS — I10 Essential (primary) hypertension: Secondary | ICD-10-CM | POA: Diagnosis not present

## 2019-04-12 DIAGNOSIS — I5033 Acute on chronic diastolic (congestive) heart failure: Secondary | ICD-10-CM | POA: Diagnosis not present

## 2019-04-12 DIAGNOSIS — Z23 Encounter for immunization: Secondary | ICD-10-CM | POA: Diagnosis not present

## 2019-04-12 DIAGNOSIS — I5022 Chronic systolic (congestive) heart failure: Secondary | ICD-10-CM | POA: Diagnosis not present

## 2019-04-12 DIAGNOSIS — N401 Enlarged prostate with lower urinary tract symptoms: Secondary | ICD-10-CM | POA: Diagnosis not present

## 2019-04-12 DIAGNOSIS — E1165 Type 2 diabetes mellitus with hyperglycemia: Secondary | ICD-10-CM | POA: Diagnosis not present

## 2019-04-12 DIAGNOSIS — Z6830 Body mass index (BMI) 30.0-30.9, adult: Secondary | ICD-10-CM | POA: Diagnosis not present

## 2019-04-12 DIAGNOSIS — I10 Essential (primary) hypertension: Secondary | ICD-10-CM | POA: Diagnosis not present

## 2019-04-12 DIAGNOSIS — I255 Ischemic cardiomyopathy: Secondary | ICD-10-CM | POA: Diagnosis not present

## 2019-04-24 ENCOUNTER — Other Ambulatory Visit: Payer: Self-pay | Admitting: Internal Medicine

## 2019-05-12 ENCOUNTER — Encounter: Payer: Self-pay | Admitting: Internal Medicine

## 2019-05-12 ENCOUNTER — Other Ambulatory Visit (HOSPITAL_COMMUNITY)
Admission: RE | Admit: 2019-05-12 | Discharge: 2019-05-12 | Disposition: A | Discharge status: Home / Self Care | Payer: Medicare Other | Source: Ambulatory Visit | Attending: Internal Medicine | Admitting: Internal Medicine

## 2019-05-12 ENCOUNTER — Other Ambulatory Visit: Payer: Self-pay

## 2019-05-12 ENCOUNTER — Ambulatory Visit (INDEPENDENT_AMBULATORY_CARE_PROVIDER_SITE_OTHER): Payer: Medicare Other | Admitting: Internal Medicine

## 2019-05-12 VITALS — BP 108/61 | HR 75 | Temp 96.9°F | Ht 74.0 in | Wt 240.2 lb

## 2019-05-12 DIAGNOSIS — Z01812 Encounter for preprocedural laboratory examination: Secondary | ICD-10-CM | POA: Insufficient documentation

## 2019-05-12 DIAGNOSIS — Z951 Presence of aortocoronary bypass graft: Secondary | ICD-10-CM

## 2019-05-12 DIAGNOSIS — I255 Ischemic cardiomyopathy: Secondary | ICD-10-CM

## 2019-05-12 DIAGNOSIS — Z20828 Contact with and (suspected) exposure to other viral communicable diseases: Secondary | ICD-10-CM | POA: Insufficient documentation

## 2019-05-12 DIAGNOSIS — I4891 Unspecified atrial fibrillation: Secondary | ICD-10-CM | POA: Diagnosis not present

## 2019-05-12 NOTE — Progress Notes (Signed)
OFFICE NOTE  Chief Complaint:  Follow-up A. fib  Primary Care Physician: [name] [name], MD  HPI:  [name] [name] is a pleasant 73 year old gentleman previously followed by Dr. Rollene Fare. His past medical history is significant for coronary artery disease. In 2013 he underwent multivessel CABG for an ischemic cardiomyopathy. EF was 20-25% prior to surgery however post surgery his EF had improved up to 45-50%. Recently his EF was 40-45% by echo in May of 2014. There was mild mitral regurgitation, mild to moderately dilated left atrium and mild LVH. Mr. Heaphy has been describing some anxiety. He also reports some pain in his legs however underwent Dopplers in September 2014 which showed preserved ABIs bilaterally a 1.0 on the right and 1.1 on the left. The bilateral peroneal and posterior tibial arteries were occluded. He reports some optimal control of his diabetes. His A1c was 7.2. He is concerned about the cost of taking Tradjenta. He is followed by Dr. Elyse Hsu.   He was recently seen in the emergency room and [facility] hospital. There he presented with hypotension and was given 1 L normal saline and his symptoms improved. He had been having dizziness as well as some upper back pain into the left shoulder blade with exertion recently. The symptoms are worse particularly when climbing up ladders or going up stairs and are relieved at rest. After that hospitalization it was recommended that he discontinue his ACE inhibitor and beta blocker, and his blood pressure appears improved today up to 110/60.  Mr. Jarrow returns today for followup of his stress test. This is interpreted as nonischemic with an EF of 44%. He reports still feeling very fatigued and extremely short of breath when doing activities. He says that the other day he tried to hit golf balls and felt that he was totally exhausted after just an hour. He reports his blood pressure has improved somewhat off of all medications however  remains low. He has had symptoms of orthostatic hypotension in the past. His symptoms do feel similar to prior to his bypass surgery. He also feels that he was much better after surgery than he is now and that his symptoms have been going on for the past 2 months.  At his last office visit, I recommended that Mr. Lisowski have a repeat cardiac catheterization (08/2013). This demonstrated the following:  Impression:  1. 2 vessel native CAD with occluded LAD in the mid-vessel  2. Patent LIMA-LAD, SVG to PLA and SVG to PDA grafts with TIMI III flow  3. LVEDP = 13 mmHg  4. Random PVC's were noted  Based on these findings, I could not find any new cardiac cause of his symptoms. I suspect that some of his fatigue could be related to labile blood sugars. He says unexplained hypotension but is normotensive off of medications.  Mr. Sleeter returns today for followup. He now reports feeling better since he had change in his medications. He is established with Dr. Buddy Duty who adjusted his diabetes medicines and stopped his Invokana. His shortness of breath and fatigue in both improved. He is now been expected some problems with left heel pain. He was found to have some spinal stenosis but has had improvement with inserts in his shoes.  I saw Mr. Binz back in the office today. He has successfully underwent back surgery earlier this year which she says initially was much improved, however he says he subsequently developed more pain going down his legs. This sounds like  it's neuropathic pain, but is reluctant to try medication for it. He also significantly fatigue. This could be due to a number of etiologies. 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 Also, he is noted to have a history of obstructive sleep apnea. He was placed on BiPAP, but has not been compliant with that due to difficulty wearing the mask. He also  never had follow-up of his sleep study.  Mr. Badgley returns today for follow-up. He reports he's had worsening dyspnea and fatigue. He says that he can hardly sleep at night. He is struggling with significant anxiety. 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 He's not been wearing the BiPAP due to significant anxiety and difficulty sleeping. He feels like he gets smothered with his machine. Communication between Dr. Radford Pax and Dr. Quintin Alto led to the starting of Celexa as well as Klonopin to use as needed at night. He says that it does give him about 6 hours of sleep. He has not been using his BiPAP as instructed. He is reportedly had more worsening shortness of breath. He was seen in the ER for this and it was thought this was due [email or web address] with BiPAP. His BNP however is elevated over 300. Today in the office he says that he's had more swelling and more abdominal fullness as well as leg edema.  I had the pleasure seeing Mr. Whedbee back today in the office. He seems to respond nicely to Lasix. I started him on 40 mg daily and while I was out of town my partner reviewed his lab work which included an elevated BNP over 300. He increased his Lasix to 40 mg twice a day. Mr. Maione says he's had a significant improvement in his breathing. The BNP now is in the mid 200s. Renal function is stable based on labs today. Unfortunately, his echocardiogram does show a new cardiomyopathy with EF 30-35%. It should be noted that he felt "awful" on blood pressure/heart failure medicines and had taken himself off of ACE inhibitor and beta blocker in the past. The echo however does suggest inferior and anterolateral wall motion abnormalities which are new and could indicate graft dysfunction. I performed his last heart catheterization in 2015 which showed all 3 grafts patent.  Mr. Sneider returns today for follow-up of his left heart catheterization. I perform  this a few weeks ago and he had patent bypass grafts however LV function is reduced. Cardiac output is also reduced. Filling pressures were mildly elevated. He reports since discharge that he's had some improvement of his symptoms. He was started on low-dose beta blocker but he does report fatigue. When I asked him how they compared to previously he said he thinks attack sleep better since he started on the beta blocker but not back to what he thinks his baseline should be. He continues to have problems with left leg pain. He had ultrasound of the left leg which were unrevealing. He also had nerve conduction studies which I sent him for which were not suggestive of neuropathy. His symptoms were worse while lying flat on the Cath Lab table and I suspect this could be again coming from his back and he may need repeat orthopedic evaluation. His primary care provider started him on Celexa recently as well for anxiety.  12/23/2015  Mr. Lebaron was seen back in the  office today in follow-up. Overall he seems to be doing well. He's tolerating low-dose carvedilol and has had some very infrequent morning dizziness. He was previously on Lasix 40 mg twice a day but ran out of the medicine about 8 days ago. He's not had any worsening weight gain or swelling nor worsening shortness of breath over the past week. This could be that he's had some improvement in his cardiomyopathy. Nevertheless, I would like him to be on some Lasix at least until we can determine whether or not he is euvolemic with lab work.  05/27/2016  Mr. Teater returns today for follow-up. He's gained about 6 pounds of weight since I last saw him. He denies any worsening shortness of breath or orthostatic dizziness. He saw Dr. Radford Pax in August for following of his obstructive sleep apnea. He remains physically active and denies any chest pain. He recently had a repeat echo in October 2017 which showed a small improvement in LV function with EF up to  35-40%. Heart failure regimen includes aspirin, carvedilol, Crestor, and spironolactone. He is not currently on a ARB is he's had orthostatic symptoms in the past although that his generally resolved. His creatinine is normal.  08/24/2016  I saw Mr. Reckard today in follow-up. He again gained about 4 pounds over the past month. He felt that the weight gain was heart failure related because he got more short of breath particularly walking up stairs. Based on that he increased his Lasix back to 40 mg daily. He notes that his urine is been darker and his urination is not necessarily improved. He's also been more dizzy. He says he gets somewhat presyncopal with change in position. Lab work 12 days ago indicated a stable creatinine however. Despite this, I feel that he may be somewhat presyncopal perhaps on too high of a dose of diuretics. He is also on Entresto 24/26 and Aldactone 12.5 mg daily. He also complained of some pain across the back between the shoulder blades. He said this got better with resting and drinking water. It's difficult to say whether that it was the water or perhaps resting from his activities. I cannot exclude that this could be angina. His EKG however is reassuring today.  09/21/2016  Mr. Freudenthal was seen today in follow-up. I decreased his Lasix, however he does not feel any change in his dizziness. There is no evidence of presyncope. He has gained about 4 pounds which I suspect is some fluid retention. Blood pressure is well-controlled today 118/56.  02/26/2017  Mr. Cleveringa returns today for follow-up. He had more recent dizziness and it seemed to worsen on Entresto. He discontinued that and feels that he's now much better. Overall he says he feels well. He is not on ACE inhibitor or ARB but remains on carvedilol. Is also on Lasix 40 mg daily. Blood pressure is stable 122/52.  08/10/2017  Mr. Paulik returns for follow-up.  Overall he seems to be feeling very well.  Denies any chest  pain or worsening shortness of breath.  He gets occasional dizziness but that is much improved.  Of note an EKG was performed again today and there is a suggestion that there may be underlying atrial flutter.  Heart rate was 74 and EKG looks very similar to his prior EKG.  The computer has interpreted a sinus rhythm with first-degree AV block and bifascicular block.  I compared EKG to his previous EKG last August which looks similar however it is distinctly different from the  EKG last of February.  He is asymptomatic, but not anticoagulated.  Otherwise, labs reviewed from October indicate total cholesterol 134, HDL 36 LDL 67 and triglycerides 157.  Hemoglobin A1c of 7.6 and serum creatinine 0.9.  08/30/2017  Mr. Ausborn returns today for follow-up of his studies.  He underwent an echocardiogram to evaluate for change in LVEF, but more importantly to rule out atrial flutter.  His EKG was abnormal and suggested possible atrial flutter, however he was asymptomatic.  The echo did not show any evidence of atrial flutter.  LVEF was stable at 40%.  He also underwent a home 48-hour Holter monitor.  This demonstrated sinus rhythm with PACs and PVCs, but no evidence of atrial fibrillation or flutter.  Remains asymptomatic.  His only other complaint today is chronic leg pain.  He was seen by Dr. Gwenlyn Found and evaluated with lower extremity arterial Dopplers.  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  A referral to neurosurgery was suggested but not placed.  01/24/2018  Mr. Burrous returns today for follow-up.  Overall he says he is doing really well.  He underwent 2 back injections and has had improvement in his back pain and leg pain.  He had an episode in June where he became somewhat dehydrated.  He presented to the ER with orthostatic hypotension.  His Lasix was cut back to 20 mg daily.  Weight is stayed stable effect is been improved from 252-246.  He says he is actually had  enough energy to play golf recently.  08/01/2018  Mr. Wery seen today in routine follow-up.  He has some occasional dizziness which is persistent.  He says he gets a little lightheaded when he urinates.  He has been off of tamsulosin with no specific benefit and now is taking the medication every third day.  Blood pressure is stable.  Weight is stable.  He denies any chest pain or worsening shortness of breath with exertion.  His last echo showed an EF of 40% which is stable if not slightly improved from an echo in 2017.  Unfortunately he could not tolerate Entresto and remains on carvedilol.  He endorses NYHA class I-II heart failure symptoms.  Recent lab work showed total cholesterol 151, HDL 39, LDL 79 triglycerides 166.  Hemoglobin A1c of 7.3.  03/30/2019  Mr. Blazejewski seen today in follow-up.  Overall he is doing well and has no complaints.  He has not recently struggled with any of the dizziness he has had previously.  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  In the past he has had EKG changes that were questionable for possible atrial flutter however monitoring as well as an echocardiogram failed to show any evidence of atrial flutter or fibrillation.  This is now clear finding of atrial fibrillation on EKG with an irregularly irregular rhythm with no evidence of P waves.  He reports being asymptomatic with this.  05/12/2019   Mr. Hutcherson returns today for follow-up of his A. fib.  He reports he has been compliant with Eliquis.  He says he had a couple episodes of loose stools.  He thought it might be due to the medicine but this seems quite atypical.  He stopped some of his stool softener/bulking agents.  Weight is down about 3 pounds.  He increased his Lasix because he felt like it was a little wheezy.  Possibly could have had some more heart failure because he  is in A. fib and does have a history of heart failure.  EKG again shows atrial  flutter/fibrillation with variable AV response at 74.  Since this is presumably persistent at this point we discussed an elective cardioversion and he is agreeable to this.  PMHx:  Past Medical History:  Diagnosis Date   Arthritis    Knee L - probably   BPH (benign prostatic hyperplasia)    CAD (coronary artery disease) 07/06/2011   CHF (congestive heart failure) (HCC)    Coronary artery disease    Diabetes mellitus    Frequency of urination    High cholesterol    History of nuclear stress test 09/2010   dipyridamole; mild perfusion defect due to attenuation with mild superimposed ischemia in apical septal, apical, apical inferior & apical lateral regions; rest LV enlarged in size; prominent gut uptake in infero-apical region; no significant ischemia demonstrated; low risk scan    HNP (herniated nucleus pulposus), lumbar    Ischemic cardiomyopathy    LV dysfunction 07/06/2011   Nocturia    Right bundle branch block    S/P CABG (coronary artery bypass graft) 08/03/2011   x3; LIMA to LAD,, SVG to PDA, SVG to posterolateral branch of RCA; Dr. Ellison Hughs   Sleep apnea    sleep study 10/2010- AHI during total sleep 32.1/hr and during REM 62.3/hr (severe sleep apnea)unable to tolerate c pap   Spinal stenosis of lumbar region    Stroke [facility])    [place]     Past Surgical History:  Procedure Laterality Date   CARDIAC CATHETERIZATION  01/2011   ischemic cardiomyopathy 30-35%, multivessel CAD (Dr. [name])    CARDIAC CATHETERIZATION  09/13/2015   Procedure: Right/Left Heart Cath and Coronary/Graft Angiography;  Surgeon: [name], MD;  Location: [facility];  Service: Cardiovascular;;   Colonscopy     CORONARY ARTERY BYPASS GRAFT  08/03/2011   Procedure: CORONARY ARTERY BYPASS GRAFTING (CABG);  Surgeon: [name], MD;  Location: [facility];  Service: Open Heart Surgery;  Laterality: N/A;  CABG times three using right saphenous vein and left mammary artery usisng  endoscope.   LEFT HEART CATHETERIZATION WITH CORONARY/GRAFT ANGIOGRAM N/A 09/20/2013   Procedure: LEFT HEART CATHETERIZATION WITH [name];  Surgeon: [name], MD;  Location: [facility];  Service: Cardiovascular;  Laterality: N/A;   Lower Extremity Arterial Doppler  03/16/2013   bilat ABIs demonstated normal values; R runoff - posterior tibial & anterior tibial arteries occluded; L runoff - peroneal & posterior tibial arteries occluded, anterior tibial artery appears occluded   LUMBAR LAMINECTOMY/DECOMPRESSION MICRODISCECTOMY N/A 09/12/2014   Procedure: LUMBAR DECOMPRESSION L3-L4, L4-L5, MICRODISCECTOMY L4-L5;  Surgeon: [name], MD;  Location: [facility];  Service: Orthopedics;  Laterality: N/A;   Pilonidal Cyst removed     TRANSTHORACIC ECHOCARDIOGRAM  11/10/2012   EF 40-45%, mild LVH, mild hypokinesis of anteroseptal myocardium, grade 1 diastolic dysfunction; mild MR & calcifed mitral annulus; LA mild-mod dilated; RA mildly dilated    FAMHx:  Family History  Problem Relation Age of Onset   Anesthesia problems Daughter    Cancer Mother    Lung disease Father        & heart disease   Colon cancer Sister    Sudden death Maternal Grandmother     SOCHx:   reports that he quit smoking about 7 years ago. His smoking use included cigars. He quit after 5.00 years of use. He quit smokeless tobacco use about 7  years ago. He reports current alcohol use. He reports that he does not use drugs.  ALLERGIES:  Allergies  Allergen Reactions   Entresto [Sacubitril-Valsartan] Other (See Comments)    dizziness    ROS: Pertinent items noted in HPI and remainder of comprehensive ROS otherwise negative.  HOME MEDS: Current Outpatient Medications  Medication Sig Dispense Refill   Alogliptin Benzoate 12.5 MG TABS Take 1 tablet by mouth daily.     apixaban (ELIQUIS) 5 MG TABS tablet Take 1 tablet (5 mg total) by mouth 2 (two) times daily. 60 tablet 11   aspirin EC  81 MG tablet Take 81 mg by mouth daily.     carvedilol (COREG) 6.25 MG tablet Take 1 tablet by mouth twice daily 180 tablet 3   Cholecalciferol (VITAMIN D-3 PO) Take 500 Units by mouth daily.     furosemide (LASIX) 40 MG tablet Take 1 tablet by mouth once daily 90 tablet 0   gabapentin (NEURONTIN) 300 MG capsule Take 300 mg by mouth daily.      glipiZIDE (GLUCOTROL XL) 10 MG 24 hr tablet Take 10 mg by mouth every morning.      metFORMIN (GLUCOPHAGE-XR) 500 MG 24 hr tablet Take 1,000 mg by mouth 2 (two) times daily.     Multiple Vitamin (MULTIVITAMIN) tablet Take 1 tablet by mouth daily.     polycarbophil (FIBERCON) 625 MG tablet Take 625 mg by mouth as needed for mild constipation or moderate constipation.      rosuvastatin (CRESTOR) 20 MG tablet Take 10 mg by mouth every other day.      spironolactone (ALDACTONE) 25 MG tablet Take 1/2 (one-half) tablet by mouth once daily 45 tablet 3   tamsulosin (FLOMAX) 0.4 MG CAPS capsule Take 1 capsule by mouth every evening.      No current facility-administered medications for this visit.     LABS/IMAGING: No results found for this or any previous visit (from the past 48 hour(s)). No results found.  VITALS: BP 108/61    Pulse 75    Temp (!) 96.9 F (36.1 C)    Ht 6\' 2"  (1.88 m)    Wt 240 lb 3.2 oz (109 kg)    SpO2 97%    BMI 30.84 kg/m   EXAM: General appearance: alert and no distress Neck: no carotid bruit, no JVD and thyroid not enlarged, symmetric, no tenderness/mass/nodules Lungs: clear to auscultation bilaterally Heart: irregularly irregular rhythm Abdomen: soft, non-tender; bowel sounds normal; no masses,  no organomegaly Extremities: extremities normal, atraumatic, no cyanosis or edema Pulses: 2+ and symmetric Skin: Skin color, texture, turgor normal. No rashes or lesions Neurologic: Grossly normal Psych: Pleasant  EKG: Atrial fib/flutter at 74, RBBB, inferior infarct pattern-personally reviewed  ASSESSMENT: 1. New  onset atrial fibrillation, CHADSVASC score of 5 2. Chronic systolic congestive heart failure - EF 40% (07/2017) 3. Coronary artery disease status post three-vessel CABG in 2013 (LIMA to LAD, SVG to PDA and SVG to PLA) - patent grafts by cath in 08/2015 4. Hypertension-controlled 5. Dyslipidemia 6. Diabetes type 2 - better controlled 7. Mild PVD-with normal ABI 8. Hypotension/upper back pain - resolved 9. 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 OSA-back on BiPAP  PLAN: 1.   Mr. Illes needs to be in persistent atrial fibrillation/flutter which is rate controlled.  He is not symptomatic but he may have had some increase in fatigue.  Recently increased his diuretic a little more because of some abdominal bloating and some wheezing which  has improved, suggest this might be a little bit of heart failure.  Because of his decreased EF he will benefit from establishing sinus rhythm.  We discussed elective cardioversion and he is agreeable to proceed.  Cover the risk, benefits and alternatives with him and his daughter.  This can likely be scheduled on my TEE day next Tuesday.  [name], MD, [facility], [place] Director of the Advanced Lipid Disorders &  Cardiovascular Risk Reduction Clinic Diplomate of the American Board of Clinical Lipidology Attending Cardiologist  Direct Dial: [phone]   Fax: [phone]  Website:  [email or web address] Hanline 05/12/2019, 1:37 PM

## 2019-05-12 NOTE — Patient Instructions (Addendum)
Medication Instructions:  Your physician recommends that you continue on your current medications as directed. Please refer to the Current Medication list given to you today.  *If you need a refill on your cardiac medications before your next appointment, please call your pharmacy*  Lab Work: BMET, CBC today If you have labs (blood work) drawn today and your tests are completely normal, you will receive your results only by: Marland Kitchen MyChart Message (if you have MyChart) OR . A paper copy in the mail If you have any lab test that is abnormal or we need to change your treatment, we will call you to review the results.  You will need to have the coronavirus test completed prior to your procedure. An appointment has been made on  TODAY. This is a Drive Up Visit at the [facility] [address] Please tell them that you are there for pre-procedure testing. Someone will direct you to the appropriate testing line. Stay in your car and someone will be with you shortly. Please make sure to have all other labs completed before this test because you will need to stay quarantined until your procedure. Please take your insurance card to this test.     Testing/Procedures: Cardioversion @ [place]: At [facility], you and your health needs are our priority.  As part of our continuing mission to provide you with exceptional heart care, we have created designated Provider Care Teams.  These Care Teams include your primary Cardiologist (physician) and Advanced Practice Providers (APPs -  Physician Assistants and Nurse Practitioners) who all work together to provide you with the care you need, when you need it.  Your next appointment:   4 weeks  The format for your next appointment:   In Person  Provider:   You may see [name], MD or one of the following Advanced Practice Providers on your designated Care Team:    Almyra Deforest, PA-C  Fabian Sharp, [place] or   Roby Lofts, [place]   Other Instructions Dear Mr. Barrett   You are scheduled for a Cardioversion on Tuesday November 17 with Dr. Debara Pickett.  Please arrive at the [facility] (Main Entrance A) at [facility]: [address] [place], [place] [address] at 1 PM  DIET: Nothing to eat or drink after midnight except a sip of water with medications (see medication instructions below)  Medication Instructions: Hold oral diabetes medications the morning of procedure  Continue your anticoagulant: Eliquis You will need to continue your anticoagulant after your procedure until you are told by your  provider that it is safe to stop   Labs: CBC & BMET today  You must have a responsible person to drive you home and stay in the waiting area during your procedure. Failure to do so could result in cancellation.  Bring your insurance cards.  *Special Note: Every effort is made to have your procedure done on time. Occasionally there are emergencies that occur at the hospital that may cause delays. Please be patient if a delay does occur.

## 2019-05-13 LAB — BASIC METABOLIC PANEL
BUN/Creatinine Ratio: 22 (ref 10–24)
BUN: 26 mg/dL (ref 8–27)
CO2: 24 mmol/L (ref 20–29)
Calcium: 9.2 mg/dL (ref 8.6–10.2)
Chloride: 102 mmol/L (ref 96–106)
Creatinine, Ser: 1.16 mg/dL (ref 0.76–1.27)
GFR calc Af Amer: 71 mL/min/{1.73_m2} (ref >59)
GFR calc non Af Amer: 61 mL/min/{1.73_m2} (ref >59)
Glucose: 174 mg/dL — ABNORMAL HIGH (ref 65–99)
Potassium: 5.1 mmol/L (ref 3.5–5.2)
Sodium: 140 mmol/L (ref 134–144)

## 2019-05-13 LAB — CBC
Hematocrit: 37.9 % (ref 37.5–51.0)
Hemoglobin: 12.9 g/dL — ABNORMAL LOW (ref 13.0–17.7)
MCH: 28.6 pg (ref 26.6–33.0)
MCHC: 34 g/dL (ref 31.5–35.7)
MCV: 84 fL (ref 79–97)
Platelets: 272 10*3/uL (ref 150–450)
RBC: 4.51 x10E6/uL (ref 4.14–5.80)
RDW: 13.5 % (ref 11.6–15.4)
WBC: 7.8 10*3/uL (ref 3.4–10.8)

## 2019-05-14 LAB — NOVEL CORONAVIRUS, NAA (HOSP ORDER, SEND-OUT TO REF LAB; TAT 18-24 HRS): SARS-CoV-2, NAA: NOT DETECTED

## 2019-05-15 ENCOUNTER — Other Ambulatory Visit: Payer: Self-pay | Admitting: Internal Medicine

## 2019-05-15 DIAGNOSIS — I4891 Unspecified atrial fibrillation: Secondary | ICD-10-CM

## 2019-05-16 ENCOUNTER — Ambulatory Visit (HOSPITAL_COMMUNITY): Payer: Medicare Other | Admitting: Anesthesiology

## 2019-05-16 ENCOUNTER — Encounter (HOSPITAL_COMMUNITY)
Admission: RE | Disposition: A | Discharge status: Home / Self Care | Payer: Self-pay | Source: Home / Self Care | Attending: Internal Medicine

## 2019-05-16 ENCOUNTER — Other Ambulatory Visit: Payer: Self-pay

## 2019-05-16 ENCOUNTER — Ambulatory Visit (HOSPITAL_COMMUNITY)
Admission: RE | Admit: 2019-05-16 | Discharge: 2019-05-16 | Disposition: A | Discharge status: Home / Self Care | Payer: Medicare Other | Attending: Internal Medicine | Admitting: Internal Medicine

## 2019-05-16 DIAGNOSIS — Z79899 Other long term (current) drug therapy: Secondary | ICD-10-CM | POA: Insufficient documentation

## 2019-05-16 DIAGNOSIS — Z951 Presence of aortocoronary bypass graft: Secondary | ICD-10-CM | POA: Diagnosis not present

## 2019-05-16 DIAGNOSIS — E785 Hyperlipidemia, unspecified: Secondary | ICD-10-CM | POA: Insufficient documentation

## 2019-05-16 DIAGNOSIS — E119 Type 2 diabetes mellitus without complications: Secondary | ICD-10-CM | POA: Diagnosis not present

## 2019-05-16 DIAGNOSIS — I2510 Atherosclerotic heart disease of native coronary artery without angina pectoris: Secondary | ICD-10-CM | POA: Insufficient documentation

## 2019-05-16 DIAGNOSIS — I4891 Unspecified atrial fibrillation: Secondary | ICD-10-CM

## 2019-05-16 DIAGNOSIS — G4733 Obstructive sleep apnea (adult) (pediatric): Secondary | ICD-10-CM | POA: Diagnosis not present

## 2019-05-16 DIAGNOSIS — I739 Peripheral vascular disease, unspecified: Secondary | ICD-10-CM | POA: Diagnosis not present

## 2019-05-16 DIAGNOSIS — Z7901 Long term (current) use of anticoagulants: Secondary | ICD-10-CM | POA: Diagnosis not present

## 2019-05-16 DIAGNOSIS — Z888 Allergy status to other drugs, medicaments and biological substances status: Secondary | ICD-10-CM | POA: Insufficient documentation

## 2019-05-16 DIAGNOSIS — Z7982 Long term (current) use of aspirin: Secondary | ICD-10-CM | POA: Insufficient documentation

## 2019-05-16 DIAGNOSIS — Z8673 Personal history of transient ischemic attack (TIA), and cerebral infarction without residual deficits: Secondary | ICD-10-CM | POA: Insufficient documentation

## 2019-05-16 DIAGNOSIS — I491 Atrial premature depolarization: Secondary | ICD-10-CM | POA: Diagnosis not present

## 2019-05-16 DIAGNOSIS — I4510 Unspecified right bundle-branch block: Secondary | ICD-10-CM | POA: Diagnosis not present

## 2019-05-16 DIAGNOSIS — I5022 Chronic systolic (congestive) heart failure: Secondary | ICD-10-CM | POA: Diagnosis not present

## 2019-05-16 DIAGNOSIS — I110 Hypertensive heart disease with heart failure: Secondary | ICD-10-CM | POA: Diagnosis not present

## 2019-05-16 DIAGNOSIS — N400 Enlarged prostate without lower urinary tract symptoms: Secondary | ICD-10-CM | POA: Insufficient documentation

## 2019-05-16 DIAGNOSIS — Z87891 Personal history of nicotine dependence: Secondary | ICD-10-CM | POA: Insufficient documentation

## 2019-05-16 DIAGNOSIS — I255 Ischemic cardiomyopathy: Secondary | ICD-10-CM | POA: Diagnosis not present

## 2019-05-16 DIAGNOSIS — I5023 Acute on chronic systolic (congestive) heart failure: Secondary | ICD-10-CM | POA: Diagnosis not present

## 2019-05-16 HISTORY — PX: CARDIOVERSION: SHX1299

## 2019-05-16 LAB — POCT I-STAT, CHEM 8
BUN: 34 mg/dL — ABNORMAL HIGH (ref 8–23)
Calcium, Ion: 1.17 mmol/L (ref 1.15–1.40)
Chloride: 104 mmol/L (ref 98–111)
Creatinine, Ser: 1 mg/dL (ref 0.61–1.24)
Glucose, Bld: 196 mg/dL — ABNORMAL HIGH (ref 70–99)
HCT: 39 % (ref 39.0–52.0)
Hemoglobin: 13.3 g/dL (ref 13.0–17.0)
Potassium: 4.6 mmol/L (ref 3.5–5.1)
Sodium: 141 mmol/L (ref 135–145)
TCO2: 24 mmol/L (ref 22–32)

## 2019-05-16 LAB — GLUCOSE, CAPILLARY: Glucose-Capillary: 183 mg/dL — ABNORMAL HIGH (ref 70–99)

## 2019-05-16 SURGERY — CARDIOVERSION
Anesthesia: General

## 2019-05-16 MED ORDER — SODIUM CHLORIDE 0.9 % IV SOLN
INTRAVENOUS | Status: AC | PRN
  Administered 2019-05-16: 500 mL via INTRAMUSCULAR

## 2019-05-16 MED ORDER — SODIUM CHLORIDE 0.9 % IV SOLN
INTRAVENOUS | Status: DC
  Administered 2019-05-16: 14:00:00 via INTRAVENOUS

## 2019-05-16 MED ORDER — PROPOFOL 10 MG/ML IV BOLUS
INTRAVENOUS | Status: DC | PRN
  Administered 2019-05-16: 40 mg via INTRAVENOUS
  Administered 2019-05-16 (×2): 30 mg via INTRAVENOUS

## 2019-05-16 MED ORDER — LIDOCAINE HCL (CARDIAC) PF 100 MG/5ML IV SOSY
PREFILLED_SYRINGE | INTRAVENOUS | Status: DC | PRN
  Administered 2019-05-16: 60 mg via INTRATRACHEAL

## 2019-05-16 NOTE — Anesthesia Procedure Notes (Signed)
Procedure Name: MAC Date/Time: 05/16/2019 1:54 PM Performed by: Teressa Lower., CRNA Pre-anesthesia Checklist: Patient identified, Emergency Drugs available, Suction available, Patient being monitored and Timeout performed Patient Re-evaluated:Patient Re-evaluated prior to induction Oxygen Delivery Method: Ambu bag Preoxygenation: Pre-oxygenation with 100% oxygen

## 2019-05-16 NOTE — Transfer of Care (Signed)
Immediate Anesthesia Transfer of Care Note  Patient: [name] [name]  Procedure(s) Performed: CARDIOVERSION (N/A )  Patient Location: Endoscopy Unit  Anesthesia Type:MAC  Level of Consciousness: awake, alert  and oriented  Airway & Oxygen Therapy: Patient Spontanous Breathing and Patient connected to nasal cannula oxygen  Post-op Assessment: Report given to RN and Post -op Vital signs reviewed and stable  Post vital signs: Reviewed and stable  Last Vitals:  Vitals Value Taken Time  BP    Temp    Pulse    Resp    SpO2      Last Pain:  Vitals:   05/16/19 1309  TempSrc: Temporal  PainSc: 0-No pain         Complications: No apparent anesthesia complications

## 2019-05-16 NOTE — Anesthesia Postprocedure Evaluation (Signed)
Anesthesia Post Note  Patient: [name] [name]  Procedure(s) Performed: CARDIOVERSION (N/A )     Patient location during evaluation: PACU Anesthesia Type: General Level of consciousness: awake and alert Pain management: pain level controlled Vital Signs Assessment: post-procedure vital signs reviewed and stable Respiratory status: spontaneous breathing, nonlabored ventilation and respiratory function stable Cardiovascular status: blood pressure returned to baseline and stable Postop Assessment: no apparent nausea or vomiting Anesthetic complications: no    Last Vitals:  Vitals:   05/16/19 1309 05/16/19 1400  BP: 124/60 (!) 142/60  Pulse: 68 71  Resp: 14 14  Temp: 36.9 C   SpO2: 100% 100%    Last Pain:  Vitals:   05/16/19 1400  TempSrc:   PainSc: 0-No pain                 Pervis Hocking

## 2019-05-16 NOTE — CV Procedure (Addendum)
   CARDIOVERSION NOTE  Procedure: Electrical Cardioversion Indications:  Atrial Fibrillation  Procedure Details:  Consent: Risks of procedure as well as the alternatives and risks of each were explained to the (patient/caregiver).  Consent for procedure obtained.  Time Out: Verified patient identification, verified procedure, site/side was marked, verified correct patient position, special equipment/implants available, medications/allergies/relevent history reviewed, required imaging and test results available.  Performed  Patient placed on cardiac monitor, pulse oximetry, supplemental oxygen as necessary.  Sedation given: Propofol per anesthesia Pacer pads placed anterior and posterior chest.  Cardioverted 1 time(s).  Cardioverted at 150J biphasic.  Impression: Findings: Post procedure EKG shows: NSR with 1st degree AVB. Complications: None Patient did tolerate procedure well.  Plan: 1. Successful DCCV with a single 150J Biphasic shock.  Time Spent Directly with the Patient:  30 minutes   [name], MD, [facility], [place] Director of the Advanced Lipid Disorders &  Cardiovascular Risk Reduction Clinic Diplomate of the American Board of Clinical Lipidology Attending Cardiologist  Direct Dial: [phone]  Fax: [phone]  Website:  [name] 05/16/2019, 1:52 PM

## 2019-05-16 NOTE — H&P (Signed)
   INTERVAL PROCEDURE H&P  History and Physical Interval Note:  05/16/2019 1:14 PM  [name] has presented today for their planned procedure. The various methods of treatment have been discussed with the patient and family. After consideration of risks, benefits and other options for treatment, the patient has consented to the procedure.  The patients' outpatient history has been reviewed, patient examined, and no change in status from most recent office note within the past 30 days. I have reviewed the patients' chart and labs and will proceed as planned. Questions were answered to the patient's satisfaction.   [name], MD, [facility], [place] Director of the Advanced Lipid Disorders &  Cardiovascular Risk Reduction Clinic Diplomate of the American Board of Clinical Lipidology Attending Cardiologist  Direct Dial: [phone]  Fax: [phone]  Website:  [email or web address] 05/16/2019, 1:14 PM

## 2019-05-16 NOTE — Anesthesia Preprocedure Evaluation (Addendum)
Anesthesia Evaluation  Patient identified by MRN, date of birth, ID band Patient awake    Reviewed: Allergy & Precautions, NPO status , Patient's Chart, lab work & pertinent test results, reviewed documented beta blocker date and time   Airway Mallampati: II  TM Distance: >3 FB Neck ROM: Full    Dental  (+) Poor Dentition, Dental Advisory Given   Pulmonary shortness of breath and with exertion, sleep apnea and Continuous Positive Airway Pressure Ventilation , former smoker,  Quit smoking 2013   Pulmonary exam normal breath sounds clear to auscultation       Cardiovascular hypertension, Pt. on medications and Pt. on home beta blockers + CAD, + CABG (2013), + Peripheral Vascular Disease and +CHF  Normal cardiovascular exam+ dysrhythmias Atrial Fibrillation  Rhythm:Regular Rate:Normal  RBBB  Last echo: - Technically difficult study with poor acoustic windows. Mildly dilated LV with mild LV hypertrophy. EF 40%, diffuse hypokinesis. Moderate diastolic dysfunction. Mildly dilated RV with mildly decreased systolic function.   CABG x 3 in 2013   Neuro/Psych CVA, No Residual Symptoms negative psych ROS   GI/Hepatic negative GI ROS, Neg liver ROS,   Endo/Other  diabetes, Type 2, Oral Hypoglycemic Agents  Renal/GU negative Renal ROS  negative genitourinary   Musculoskeletal  (+) Arthritis , Osteoarthritis,  Spinal stenosis   Abdominal Normal abdominal exam  (+)   Peds negative pediatric ROS (+)  Hematology negative hematology ROS (+)   Anesthesia Other Findings   Reproductive/Obstetrics negative OB ROS                             Anesthesia Physical  Anesthesia Plan  ASA: III  Anesthesia Plan: General   Post-op Pain Management:    Induction: Intravenous  PONV Risk Score and Plan: TIVA  Airway Management Planned: Mask and Natural Airway  Additional Equipment: None  Intra-op Plan:    Post-operative Plan:   Informed Consent: I have reviewed the patients History and Physical, chart, labs and discussed the procedure including the risks, benefits and alternatives for the proposed anesthesia with the patient or authorized representative who has indicated his/her understanding and acceptance.       Plan Discussed with: CRNA  Anesthesia Plan Comments:         Anesthesia Quick Evaluation

## 2019-05-17 ENCOUNTER — Encounter (HOSPITAL_COMMUNITY): Payer: Self-pay | Admitting: Internal Medicine

## 2019-05-22 DIAGNOSIS — E1140 Type 2 diabetes mellitus with diabetic neuropathy, unspecified: Secondary | ICD-10-CM | POA: Diagnosis not present

## 2019-05-22 DIAGNOSIS — L110 Acquired keratosis follicularis: Secondary | ICD-10-CM | POA: Diagnosis not present

## 2019-05-22 DIAGNOSIS — M79671 Pain in right foot: Secondary | ICD-10-CM | POA: Diagnosis not present

## 2019-05-22 DIAGNOSIS — I739 Peripheral vascular disease, unspecified: Secondary | ICD-10-CM | POA: Diagnosis not present

## 2019-05-22 DIAGNOSIS — M79672 Pain in left foot: Secondary | ICD-10-CM | POA: Diagnosis not present

## 2019-06-13 ENCOUNTER — Other Ambulatory Visit: Payer: Self-pay

## 2019-06-13 ENCOUNTER — Encounter: Payer: Self-pay | Admitting: Internal Medicine

## 2019-06-13 ENCOUNTER — Ambulatory Visit (INDEPENDENT_AMBULATORY_CARE_PROVIDER_SITE_OTHER): Payer: Medicare Other | Admitting: Internal Medicine

## 2019-06-13 VITALS — BP 119/63 | HR 61 | Temp 96.8°F | Ht 74.0 in | Wt 238.6 lb

## 2019-06-13 DIAGNOSIS — I4891 Unspecified atrial fibrillation: Secondary | ICD-10-CM

## 2019-06-13 DIAGNOSIS — I951 Orthostatic hypotension: Secondary | ICD-10-CM

## 2019-06-13 DIAGNOSIS — I255 Ischemic cardiomyopathy: Secondary | ICD-10-CM

## 2019-06-13 DIAGNOSIS — I10 Essential (primary) hypertension: Secondary | ICD-10-CM | POA: Diagnosis not present

## 2019-06-13 NOTE — Patient Instructions (Signed)
Medication Instructions:  STOP ALDACTONE .   *If you need a refill on your cardiac medications before your next appointment, please call your pharmacy*  Lab Work: none  Testing/Procedures:  none  Follow-Up: At [facility], you and your health needs are our priority.  As part of our continuing mission to provide you with exceptional heart care, we have created designated Provider Care Teams.  These Care Teams include your primary Cardiologist (physician) and Advanced Practice Providers (APPs -  Physician Assistants and Nurse Practitioners) who all work together to provide you with the care you need, when you need it.  Your next appointment:   3 month(s)  The format for your next appointment:   Either In Person or Virtual  Provider:   DR. [name]  Other Instructions

## 2019-06-13 NOTE — Progress Notes (Signed)
OFFICE NOTE  Chief Complaint:  Positional dizziness  Primary Care Physician: [name] [name], MD  HPI:  [name] [name] is a pleasant 73 year old gentleman previously followed by Dr. Rollene Fare. His past medical history is significant for coronary artery disease. In 2013 he underwent multivessel CABG for an ischemic cardiomyopathy. EF was 20-25% prior to surgery however post surgery his EF had improved up to 45-50%. Recently his EF was 40-45% by echo in May of 2014. There was mild mitral regurgitation, mild to moderately dilated left atrium and mild LVH. [name] [name] has been describing some anxiety. He also reports some pain in his legs however underwent Dopplers in September 2014 which showed preserved ABIs bilaterally a 1.0 on the right and 1.1 on the left. The bilateral peroneal and posterior tibial arteries were occluded. He reports some optimal control of his diabetes. His A1c was 7.2. He is concerned about the cost of taking Tradjenta. He is followed by Dr. Elyse Hsu.   He was recently seen in the emergency room and he can hospital. There he presented with hypotension and was given 1 L normal saline and his symptoms improved. He had been having dizziness as well as some upper back pain into the left shoulder blade with exertion recently. The symptoms are worse particularly when climbing up ladders or going up stairs and are relieved at rest. After that hospitalization it was recommended that he discontinue his ACE inhibitor and beta blocker, and his blood pressure appears improved today up to 110/60.  [name] [name] returns today for followup of his stress test. This is interpreted as nonischemic with an EF of 44%. He reports still feeling very fatigued and extremely short of breath when doing activities. He says that the other day he tried to hit golf balls and felt that he was totally exhausted after just an hour. He reports his blood pressure has improved somewhat off of all medications  however remains low. He has had symptoms of orthostatic hypotension in the past. His symptoms do feel similar to prior to his bypass surgery. He also feels that he was much better after surgery than he is now and that his symptoms have been going on for the past 2 months.  At his last office visit, I recommended that [name] [name] have a repeat cardiac catheterization (08/2013). This demonstrated the following:  Impression:  1. 2 vessel native CAD with occluded LAD in the mid-vessel  2. Patent LIMA-LAD, SVG to PLA and SVG to PDA grafts with TIMI III flow  3. LVEDP = 13 mmHg  4. Random PVC's were noted  Based on these findings, I could not find any new cardiac cause of his symptoms. I suspect that some of his fatigue could be related to labile blood sugars. He says unexplained hypotension but is normotensive off of medications.  [name] [name] returns today for followup. He now reports feeling better since he had change in his medications. He is established with Dr. Buddy Duty who adjusted his diabetes medicines and stopped his Invokana. His shortness of breath and fatigue in both improved. He is now been expected some problems with left heel pain. He was found to have some spinal stenosis but has had improvement with inserts in his shoes.  I saw [name] [name] back in the office today. He has successfully underwent back surgery earlier this year which she says initially was much improved, however he says he subsequently developed more pain going down his legs. This sounds like it's  neuropathic pain, but is reluctant to try medication for it. He also significantly fatigue. This could be due to a number of etiologies. 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 Also, he is noted to have a history of obstructive sleep apnea. He was placed on BiPAP, but has not been compliant with that due to difficulty wearing the mask.  He also never had follow-up of his sleep study.  [name] [name] returns today for follow-up. He reports he's had worsening dyspnea and fatigue. He says that he can hardly sleep at night. He is struggling with significant anxiety. 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 He's not been wearing the BiPAP due to significant anxiety and difficulty sleeping. He feels like he gets smothered with his machine. Communication between Dr. Radford Pax and Dr. Quintin Alto led to the starting of Celexa as well as Klonopin to use as needed at night. He says that it does give him about 6 hours of sleep. He has not been using his BiPAP as instructed. He is reportedly had more worsening shortness of breath. He was seen in the ER for this and it was thought this was due [email or web address] with BiPAP. His BNP however is elevated over 300. Today in the office he says that he's had more swelling and more abdominal fullness as well as leg edema.  I had the pleasure seeing [name] [name] back today in the office. He seems to respond nicely to Lasix. I started him on 40 mg daily and while I was out of town my partner reviewed his lab work which included an elevated BNP over 300. He increased his Lasix to 40 mg twice a day. [name] [name] says he's had a significant improvement in his breathing. The BNP now is in the mid 200s. Renal function is stable based on labs today. Unfortunately, his echocardiogram does show a new cardiomyopathy with EF 30-35%. It should be noted that he felt "awful" on blood pressure/heart failure medicines and had taken himself off of ACE inhibitor and beta blocker in the past. The echo however does suggest inferior and anterolateral wall motion abnormalities which are new and could indicate graft dysfunction. I performed his last heart catheterization in 2015 which showed all 3 grafts patent.  [name] [name] returns today for follow-up of his left heart catheterization. I  perform this a few weeks ago and he had patent bypass grafts however LV function is reduced. Cardiac output is also reduced. Filling pressures were mildly elevated. He reports since discharge that he's had some improvement of his symptoms. He was started on low-dose beta blocker but he does report fatigue. When I asked him how they compared to previously he said he thinks attack sleep better since he started on the beta blocker but not back to what he thinks his baseline should be. He continues to have problems with left leg pain. He had ultrasound of the left leg which were unrevealing. He also had nerve conduction studies which I sent him for which were not suggestive of neuropathy. His symptoms were worse while lying flat on the Cath Lab table and I suspect this could be again coming from his back and he may need repeat orthopedic evaluation. His primary care provider started him on Celexa recently as well for anxiety.  12/23/2015  [name] [name] was seen back in the office  today in follow-up. Overall he seems to be doing well. He's tolerating low-dose carvedilol and has had some very infrequent morning dizziness. He was previously on Lasix 40 mg twice a day but ran out of the medicine about 8 days ago. He's not had any worsening weight gain or swelling nor worsening shortness of breath over the past week. This could be that he's had some improvement in his cardiomyopathy. Nevertheless, I would like him to be on some Lasix at least until we can determine whether or not he is euvolemic with lab work.  05/27/2016  [name] [name] returns today for follow-up. He's gained about 6 pounds of weight since I last saw him. He denies any worsening shortness of breath or orthostatic dizziness. He saw Dr. Radford Pax in August for following of his obstructive sleep apnea. He remains physically active and denies any chest pain. He recently had a repeat echo in October 2017 which showed a small improvement in LV function with EF up  to 35-40%. Heart failure regimen includes aspirin, carvedilol, Crestor, and spironolactone. He is not currently on a ARB is he's had orthostatic symptoms in the past although that his generally resolved. His creatinine is normal.  08/24/2016  I saw [name] [name] today in follow-up. He again gained about 4 pounds over the past month. He felt that the weight gain was heart failure related because he got more short of breath particularly walking up stairs. Based on that he increased his Lasix back to 40 mg daily. He notes that his urine is been darker and his urination is not necessarily improved. He's also been more dizzy. He says he gets somewhat presyncopal with change in position. Lab work 12 days ago indicated a stable creatinine however. Despite this, I feel that he may be somewhat presyncopal perhaps on too high of a dose of diuretics. He is also on Entresto 24/26 and Aldactone 12.5 mg daily. He also complained of some pain across the back between the shoulder blades. He said this got better with resting and drinking water. It's difficult to say whether that it was the water or perhaps resting from his activities. I cannot exclude that this could be angina. His EKG however is reassuring today.  09/21/2016  [name] [name] was seen today in follow-up. I decreased his Lasix, however he does not feel any change in his dizziness. There is no evidence of presyncope. He has gained about 4 pounds which I suspect is some fluid retention. Blood pressure is well-controlled today 118/56.  02/26/2017  [name] [name] returns today for follow-up. He had more recent dizziness and it seemed to worsen on Entresto. He discontinued that and feels that he's now much better. Overall he says he feels well. He is not on ACE inhibitor or ARB but remains on carvedilol. Is also on Lasix 40 mg daily. Blood pressure is stable 122/52.  08/10/2017  [name] [name] returns for follow-up.  Overall he seems to be feeling very well.  Denies any chest  pain or worsening shortness of breath.  He gets occasional dizziness but that is much improved.  Of note an EKG was performed again today and there is a suggestion that there may be underlying atrial flutter.  Heart rate was 74 and EKG looks very similar to his prior EKG.  The computer has interpreted a sinus rhythm with first-degree AV block and bifascicular block.  I compared EKG to his previous EKG last August which looks similar however it is distinctly different from the EKG  last of February.  He is asymptomatic, but not anticoagulated.  Otherwise, labs reviewed from October indicate total cholesterol 134, HDL 36 LDL 67 and triglycerides 157.  Hemoglobin A1c of 7.6 and serum creatinine 0.9.  08/30/2017  [name] [name] returns today for follow-up of his studies.  He underwent an echocardiogram to evaluate for change in LVEF, but more importantly to rule out atrial flutter.  His EKG was abnormal and suggested possible atrial flutter, however he was asymptomatic.  The echo did not show any evidence of atrial flutter.  LVEF was stable at 40%.  He also underwent a home 48-hour Holter monitor.  This demonstrated sinus rhythm with PACs and PVCs, but no evidence of atrial fibrillation or flutter.  Remains asymptomatic.  His only other complaint today is chronic leg pain.  He was seen by Dr. Gwenlyn Found and evaluated with lower extremity arterial Dopplers.  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  A referral to neurosurgery was suggested but not placed.  01/24/2018  [name] [name] returns today for follow-up.  Overall he says he is doing really well.  He underwent 2 back injections and has had improvement in his back pain and leg pain.  He had an episode in June where he became somewhat dehydrated.  He presented to the ER with orthostatic hypotension.  His Lasix was cut back to 20 mg daily.  Weight is stayed stable effect is been improved from 252-246.  He says he is actually had  enough energy to play golf recently.  08/01/2018  Mr. Hawe seen today in routine follow-up.  He has some occasional dizziness which is persistent.  He says he gets a little lightheaded when he urinates.  He has been off of tamsulosin with no specific benefit and now is taking the medication every third day.  Blood pressure is stable.  Weight is stable.  He denies any chest pain or worsening shortness of breath with exertion.  His last echo showed an EF of 40% which is stable if not slightly improved from an echo in 2017.  Unfortunately he could not tolerate Entresto and remains on carvedilol.  He endorses NYHA class I-II heart failure symptoms.  Recent lab work showed total cholesterol 151, HDL 39, LDL 79 triglycerides 166.  Hemoglobin A1c of 7.3.  03/30/2019  Mr. Schroepfer seen today in follow-up.  Overall he is doing well and has no complaints.  He has not recently struggled with any of the dizziness he has had previously.  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  In the past he has had EKG changes that were questionable for possible atrial flutter however monitoring as well as an echocardiogram failed to show any evidence of atrial flutter or fibrillation.  This is now clear finding of atrial fibrillation on EKG with an irregularly irregular rhythm with no evidence of P waves.  He reports being asymptomatic with this.  05/12/2019   Mr. Lua returns today for follow-up of his A. fib.  He reports he has been compliant with Eliquis.  He says he had a couple episodes of loose stools.  He thought it might be due to the medicine but this seems quite atypical.  He stopped some of his stool softener/bulking agents.  Weight is down about 3 pounds.  He increased his Lasix because he felt like it was a little wheezy.  Possibly could have had some more heart failure because he is  in A. fib and does have a history of heart failure.  EKG again shows atrial  flutter/fibrillation with variable AV response at 74.  Since this is presumably persistent at this point we discussed an elective cardioversion and he is agreeable to this.  06/13/2019  Mr. Huesca was seen today in follow-up.  He underwent cardioversion by myself in November.  Since then he has felt fairly well.  His EKG today shows that he is maintaining sinus rhythm.  Blood pressure was 119/63 however he has had some positional dizziness at home.  His daughter notes he has had some blood pressures with low diastolics less than 50 and he does feel some dizziness.  He also feels some fatigue when exerting himself a lot.  I suspect this could be due to hypotension.  Is possible also that his EF has improved now that he is back in sinus and that he may be over diuresed being on both Lasix and Aldactone.  PMHx:  Past Medical History:  Diagnosis Date  . Arthritis    Knee L - probably  . BPH (benign prostatic hyperplasia)   . CAD (coronary artery disease) 07/06/2011  . CHF (congestive heart failure) ([place])   . Coronary artery disease   . Diabetes mellitus   . Frequency of urination   . High cholesterol   . History of nuclear stress test 09/2010   dipyridamole; mild perfusion defect due to attenuation with mild superimposed ischemia in apical septal, apical, apical inferior & apical lateral regions; rest LV enlarged in size; prominent gut uptake in infero-apical region; no significant ischemia demonstrated; low risk scan   . HNP (herniated nucleus pulposus), lumbar   . Ischemic cardiomyopathy   . LV dysfunction 07/06/2011  . Nocturia   . Right bundle branch block   . 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 Ellison Hughs  . Sleep apnea    sleep study 10/2010- AHI during total sleep 32.1/hr and during REM 62.3/hr (severe sleep apnea)unable to tolerate c pap  . Spinal stenosis of lumbar region   . Stroke [facility])    [place]      Past Surgical History:  Procedure Laterality Date  . CARDIAC CATHETERIZATION  01/2011   ischemic cardiomyopathy 30-35%, multivessel CAD (Dr. [name])   . CARDIAC CATHETERIZATION  09/13/2015   Procedure: Right/Left Heart Cath and Coronary/Graft Angiography;  Surgeon: [name], MD;  Location: [place] CV LAB;  Service: Cardiovascular;;  . CARDIOVERSION N/A 05/16/2019   Procedure: CARDIOVERSION;  Surgeon: [name], MD;  Location: [facility] ENDOSCOPY;  Service: Cardiovascular;  Laterality: N/A;  . Colonscopy    . CORONARY ARTERY BYPASS GRAFT  08/03/2011   Procedure: CORONARY ARTERY BYPASS GRAFTING (CABG);  Surgeon: [name], MD;  Location: [place];  Service: Open Heart Surgery;  Laterality: N/A;  CABG times three using right saphenous vein and left mammary artery usisng endoscope.  Marland Kitchen LEFT HEART CATHETERIZATION WITH CORONARY/GRAFT ANGIOGRAM N/A 09/20/2013   Procedure: LEFT HEART CATHETERIZATION WITH [name];  Surgeon: [name], MD;  [facility] C[facility] LAB;  Service: Cardiovascular;  Laterality: N/A;  . Lower Extremity Arterial Doppler  03/16/2013   bilat ABIs demonstated normal values; R runoff - posterior tibial & anterior tibial arteries occluded; L runoff - peroneal & posterior tibial arteries occluded, anterior tibial artery appears occluded  . LUMBAR LAMINECTOMY/DECOMPRESSION MICRODISCECTOMY N/A 09/12/2014   Procedure: LUMBAR  DECOMPRESSION L3-L4, L4-L5, MICRODISCECTOMY L4-L5;  Surgeon: [name], MD;  Location: [facility] ORS;  Service: Orthopedics;  Laterality: N/A;  . Pilonidal Cyst removed    . TRANSTHORACIC ECHOCARDIOGRAM  11/10/2012   EF 40-45%, mild LVH, mild hypokinesis of anteroseptal myocardium, grade 1 diastolic dysfunction; mild MR & calcifed mitral annulus; LA mild-mod dilated; RA mildly dilated    FAMHx:  Family History  Problem Relation Age of Onset  . Anesthesia problems Daughter   . Cancer Mother   . Lung disease Father        & heart  disease  . Colon cancer Sister   . Sudden death Maternal Grandmother     SOCHx:   reports that he quit smoking about 7 years ago. His smoking use included cigars. He quit after 5.00 years of use. He quit smokeless tobacco use about 7 years ago. He reports current alcohol use. He reports that he does not use drugs.  ALLERGIES:  Allergies  Allergen Reactions  . Entresto [Sacubitril-Valsartan] Other (See Comments)    dizziness    ROS: Pertinent items noted in HPI and remainder of comprehensive ROS otherwise negative.  HOME MEDS: Current Outpatient Medications  Medication Sig Dispense Refill  . Alogliptin Benzoate 12.5 MG TABS Take 12.5 mg by mouth every evening.     Marland Kitchen apixaban (ELIQUIS) 5 MG TABS tablet Take 1 tablet (5 mg total) by mouth 2 (two) times daily. 60 tablet 11  . aspirin EC 81 MG tablet Take 81 mg by mouth at bedtime.     . carvedilol (COREG) 6.25 MG tablet Take 1 tablet by mouth twice daily (Patient taking differently: Take 6.25 mg by mouth 2 (two) times daily. ) 180 tablet 3  . Cholecalciferol (VITAMIN D) 50 MCG (2000 UT) tablet Take 2,000 Units by mouth at bedtime.    . furosemide (LASIX) 40 MG tablet Take 1 tablet by mouth once daily (Patient taking differently: Take 40 mg by mouth daily. ) 90 tablet 0  . gabapentin (NEURONTIN) 300 MG capsule Take 300 mg by mouth at bedtime.     Marland Kitchen glipiZIDE (GLUCOTROL XL) 10 MG 24 hr tablet Take 10 mg by mouth at bedtime.     . metFORMIN (GLUCOPHAGE-XR) 500 MG 24 hr tablet Take 1,000 mg by mouth 2 (two) times daily.    . rosuvastatin (CRESTOR) 20 MG tablet Take 10 mg by mouth every other day.     . spironolactone (ALDACTONE) 25 MG tablet Take 1/2 (one-half) tablet by mouth once daily (Patient taking differently: Take 12.5 mg by mouth daily. ) 45 tablet 3  . tamsulosin (FLOMAX) 0.4 MG CAPS capsule Take 0.4 mg by mouth every evening.      No current facility-administered medications for this visit.    LABS/IMAGING: No results found  for this or any previous visit (from the past 48 hour(s)). No results found.  VITALS: BP 119/63   Pulse 61   Temp (!) 96.8 F (36 C)   Ht 6\' 2"  (1.88 m)   Wt 238 lb 9.6 oz (108.2 kg)   SpO2 97%   BMI 30.63 kg/m   EXAM: General appearance: alert and no distress Neck: no carotid bruit, no JVD and thyroid not enlarged, symmetric, no tenderness/mass/nodules Lungs: clear to auscultation bilaterally Heart: irregularly irregular rhythm Abdomen: soft, non-tender; bowel sounds normal; no masses,  no organomegaly Extremities: extremities normal, atraumatic, no cyanosis or edema Pulses: 2+ and symmetric Skin: Skin color, texture, turgor normal. No rashes or lesions Neurologic: Grossly normal  Psych: Pleasant  EKG: Sinus bradycardia with first-degree AV block and PVCs, bifascicular block at 57-personally reviewed  ASSESSMENT: 1. Orthostatic hypotension 2. New onset atrial fibrillation, CHADSVASC score of 5, status post DCCV (04/2019) 3. Chronic systolic congestive heart failure - EF 40% (07/2017) 4. Coronary artery disease status post three-vessel CABG in 2013 (LIMA to LAD, SVG to PDA and SVG to PLA) - patent grafts by cath in 08/2015 5. Hypertension-controlled 6. Dyslipidemia 7. Diabetes type 2 - better controlled 8. Mild PVD-with normal ABI 9. Hypotension/upper back pain - resolved 10. 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 OSA-back on BiPAP  PLAN: 1.   Mr. Galo appears to be having orthostatic hypotension, this may be due to combination of weight loss as well as improved LVEF after cardioversion.  He is on both Lasix and Aldactone.  I recommend discontinuing the Aldactone to allow higher blood pressure and less diuresis.  He should monitor for improvement in blood pressure and resolution of his orthostatic symptoms.  We will plan follow-up in 3 months or sooner as necessary.  He is maintaining sinus.  At some point we will want to repeat an echo as  well.  [name], MD, [facility], [place] Director of the [facility] Lipid Disorders &  Cardiovascular Risk Reduction Clinic Diplomate of the American Board of Clinical Lipidology Attending Cardiologist  Direct Dial: [phone]  Fax: [phone]  Website:  [email or web address] 06/13/2019, 10:03 AM

## 2019-07-13 DIAGNOSIS — Z23 Encounter for immunization: Secondary | ICD-10-CM | POA: Diagnosis not present

## 2019-07-28 DIAGNOSIS — I5033 Acute on chronic diastolic (congestive) heart failure: Secondary | ICD-10-CM | POA: Diagnosis not present

## 2019-07-28 DIAGNOSIS — F411 Generalized anxiety disorder: Secondary | ICD-10-CM | POA: Diagnosis not present

## 2019-08-07 DIAGNOSIS — E1140 Type 2 diabetes mellitus with diabetic neuropathy, unspecified: Secondary | ICD-10-CM | POA: Diagnosis not present

## 2019-08-07 DIAGNOSIS — I739 Peripheral vascular disease, unspecified: Secondary | ICD-10-CM | POA: Diagnosis not present

## 2019-08-07 DIAGNOSIS — L110 Acquired keratosis follicularis: Secondary | ICD-10-CM | POA: Diagnosis not present

## 2019-08-07 DIAGNOSIS — M79672 Pain in left foot: Secondary | ICD-10-CM | POA: Diagnosis not present

## 2019-08-07 DIAGNOSIS — M79671 Pain in right foot: Secondary | ICD-10-CM | POA: Diagnosis not present

## 2019-08-10 DIAGNOSIS — Z23 Encounter for immunization: Secondary | ICD-10-CM | POA: Diagnosis not present

## 2019-08-14 DIAGNOSIS — E1165 Type 2 diabetes mellitus with hyperglycemia: Secondary | ICD-10-CM | POA: Diagnosis not present

## 2019-08-14 DIAGNOSIS — N400 Enlarged prostate without lower urinary tract symptoms: Secondary | ICD-10-CM | POA: Diagnosis not present

## 2019-08-14 DIAGNOSIS — I10 Essential (primary) hypertension: Secondary | ICD-10-CM | POA: Diagnosis not present

## 2019-08-14 DIAGNOSIS — E782 Mixed hyperlipidemia: Secondary | ICD-10-CM | POA: Diagnosis not present

## 2019-08-16 DIAGNOSIS — F5221 Male erectile disorder: Secondary | ICD-10-CM | POA: Diagnosis not present

## 2019-08-16 DIAGNOSIS — R42 Dizziness and giddiness: Secondary | ICD-10-CM | POA: Diagnosis not present

## 2019-08-16 DIAGNOSIS — E1140 Type 2 diabetes mellitus with diabetic neuropathy, unspecified: Secondary | ICD-10-CM | POA: Diagnosis not present

## 2019-08-16 DIAGNOSIS — I10 Essential (primary) hypertension: Secondary | ICD-10-CM | POA: Diagnosis not present

## 2019-08-16 DIAGNOSIS — Z6830 Body mass index (BMI) 30.0-30.9, adult: Secondary | ICD-10-CM | POA: Diagnosis not present

## 2019-08-16 DIAGNOSIS — N401 Enlarged prostate with lower urinary tract symptoms: Secondary | ICD-10-CM | POA: Diagnosis not present

## 2019-08-16 DIAGNOSIS — I5033 Acute on chronic diastolic (congestive) heart failure: Secondary | ICD-10-CM | POA: Diagnosis not present

## 2019-08-16 DIAGNOSIS — I5022 Chronic systolic (congestive) heart failure: Secondary | ICD-10-CM | POA: Diagnosis not present

## 2019-08-25 DIAGNOSIS — I110 Hypertensive heart disease with heart failure: Secondary | ICD-10-CM | POA: Diagnosis not present

## 2019-08-25 DIAGNOSIS — E7849 Other hyperlipidemia: Secondary | ICD-10-CM | POA: Diagnosis not present

## 2019-08-25 DIAGNOSIS — I5033 Acute on chronic diastolic (congestive) heart failure: Secondary | ICD-10-CM | POA: Diagnosis not present

## 2019-08-25 DIAGNOSIS — E1165 Type 2 diabetes mellitus with hyperglycemia: Secondary | ICD-10-CM | POA: Diagnosis not present

## 2019-09-15 ENCOUNTER — Other Ambulatory Visit: Payer: Self-pay

## 2019-09-15 ENCOUNTER — Encounter: Payer: Self-pay | Admitting: Internal Medicine

## 2019-09-15 ENCOUNTER — Ambulatory Visit (INDEPENDENT_AMBULATORY_CARE_PROVIDER_SITE_OTHER): Payer: Medicare Other | Admitting: Internal Medicine

## 2019-09-15 VITALS — BP 132/76 | HR 77 | Temp 97.5°F | Ht 74.0 in | Wt 248.0 lb

## 2019-09-15 DIAGNOSIS — I255 Ischemic cardiomyopathy: Secondary | ICD-10-CM | POA: Diagnosis not present

## 2019-09-15 DIAGNOSIS — M545 Low back pain, unspecified: Secondary | ICD-10-CM

## 2019-09-15 DIAGNOSIS — R0602 Shortness of breath: Secondary | ICD-10-CM | POA: Diagnosis not present

## 2019-09-15 DIAGNOSIS — G8929 Other chronic pain: Secondary | ICD-10-CM

## 2019-09-15 DIAGNOSIS — I951 Orthostatic hypotension: Secondary | ICD-10-CM | POA: Diagnosis not present

## 2019-09-15 DIAGNOSIS — Z951 Presence of aortocoronary bypass graft: Secondary | ICD-10-CM

## 2019-09-15 NOTE — Progress Notes (Signed)
OFFICE NOTE  Chief Complaint:  Positional dizziness follow-up  Primary Care Physician: [name] [name], MD  HPI:  [name] [name] is a pleasant 74 year old gentleman previously followed by [name] [name]. His past medical history is significant for coronary artery disease. In 2013 he underwent multivessel CABG for an ischemic cardiomyopathy. EF was 20-25% prior to surgery however post surgery his EF had improved up to 45-50%. Recently his EF was 40-45% by echo in May of 2014. There was mild mitral regurgitation, mild to moderately dilated left atrium and mild LVH. [name] [name] has been describing some anxiety. He also reports some pain in his legs however underwent Dopplers in September 2014 which showed preserved ABIs bilaterally a 1.0 on the right and 1.1 on the left. The bilateral peroneal and posterior tibial arteries were occluded. He reports some optimal control of his diabetes. His A1c was 7.2. He is concerned about the cost of taking Tradjenta. He is followed by [name] [name].   He was recently seen in the emergency room and [facility] hospital. There he presented with hypotension and was given 1 L normal saline and his symptoms improved. He had been having dizziness as well as some upper back pain into the left shoulder blade with exertion recently. The symptoms are worse particularly when climbing up ladders or going up stairs and are relieved at rest. After that hospitalization it was recommended that he discontinue his ACE inhibitor and beta blocker, and his blood pressure appears improved today up to 110/60.  [name] [name] returns today for followup of his stress test. This is interpreted as nonischemic with an EF of 44%. He reports still feeling very fatigued and extremely short of breath when doing activities. He says that the other day he tried to hit golf balls and felt that he was totally exhausted after just an hour. He reports his blood pressure has improved somewhat off of all  medications however remains low. He has had symptoms of orthostatic hypotension in the past. His symptoms do feel similar to prior to his bypass surgery. He also feels that he was much better after surgery than he is now and that his symptoms have been going on for the past 2 months.  At his last office visit, I recommended that [name] [name] have a repeat cardiac catheterization (08/2013). This demonstrated the following:  Impression:  1. 2 vessel native CAD with occluded LAD in the mid-vessel  2. Patent LIMA-LAD, SVG to PLA and SVG to PDA grafts with TIMI III flow  3. LVEDP = 13 mmHg  4. Random PVC's were noted  Based on these findings, I could not find any new cardiac cause of his symptoms. I suspect that some of his fatigue could be related to labile blood sugars. He says unexplained hypotension but is normotensive off of medications.  [name] [name] returns today for followup. He now reports feeling better since he had change in his medications. He is established with Dr. Buddy [name] who adjusted his diabetes medicines and stopped his Invokana. His shortness of breath and fatigue in both improved. He is now been expected some problems with left heel pain. He was found to have some spinal stenosis but has had improvement with inserts in his shoes.  I saw [name] [name] back in the office today. He has successfully underwent back surgery earlier this year which she says initially was much improved, however he says he subsequently developed more pain going down his legs. This sounds like  it's neuropathic pain, but is reluctant to try medication for it. He also significantly fatigue. This could be due to a number of etiologies. 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 Also, he is noted to have a history of obstructive sleep apnea. He was placed on BiPAP, but has not been compliant with that due to difficulty  wearing the mask. He also never had follow-up of his sleep study.  [name] [name] returns today for follow-up. He reports he's had worsening dyspnea and fatigue. He says that he can hardly sleep at night. He is struggling with significant anxiety. 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 He's not been wearing the BiPAP due to significant anxiety and difficulty sleeping. He feels like he gets smothered with his machine. Communication between [name] [name] and [name] [name] led to the starting of Celexa as well as Klonopin to use as needed at night. He says that it does give him about 6 hours of sleep. He has not been using his BiPAP as instructed. He is reportedly had more worsening shortness of breath. He was seen in the ER for this and it was thought this was due [email or web address] with BiPAP. His BNP however is elevated over 300. Today in the office he says that he's had more swelling and more abdominal fullness as well as leg edema.  I had the pleasure seeing [name] [name] back today in the office. He seems to respond nicely to Lasix. I started him on 40 mg daily and while I was out of town my partner reviewed his lab work which included an elevated BNP over 300. He increased his Lasix to 40 mg twice a day. [name] [name] says he's had a significant improvement in his breathing. The BNP now is in the mid 200s. Renal function is stable based on labs today. Unfortunately, his echocardiogram does show a new cardiomyopathy with EF 30-35%. It should be noted that he felt "awful" on blood pressure/heart failure medicines and had taken himself off of ACE inhibitor and beta blocker in the past. The echo however does suggest inferior and anterolateral wall motion abnormalities which are new and could indicate graft dysfunction. I performed his last heart catheterization in 2015 which showed all 3 grafts patent.  [name] [name] returns today for follow-up of his left heart  catheterization. I perform this a few weeks ago and he had patent bypass grafts however LV function is reduced. Cardiac output is also reduced. Filling pressures were mildly elevated. He reports since discharge that he's had some improvement of his symptoms. He was started on low-dose beta blocker but he does report fatigue. When I asked him how they compared to previously he said he thinks attack sleep better since he started on the beta blocker but not back to what he thinks his baseline should be. He continues to have problems with left leg pain. He had ultrasound of the left leg which were unrevealing. He also had nerve conduction studies which I sent him for which were not suggestive of neuropathy. His symptoms were worse while lying flat on the Cath Lab table and I suspect this could be again coming from his back and he may need repeat orthopedic evaluation. His primary care provider started him on Celexa recently as well for anxiety.  12/23/2015  Mr. Verhelst was seen back in the  office today in follow-up. Overall he seems to be doing well. He's tolerating low-dose carvedilol and has had some very infrequent morning dizziness. He was previously on Lasix 40 mg twice a day but ran out of the medicine about 8 days ago. He's not had any worsening weight gain or swelling nor worsening shortness of breath over the past week. This could be that he's had some improvement in his cardiomyopathy. Nevertheless, I would like him to be on some Lasix at least until we can determine whether or not he is euvolemic with lab work.  05/27/2016  Mr. Kos returns today for follow-up. He's gained about 6 pounds of weight since I last saw him. He denies any worsening shortness of breath or orthostatic dizziness. He saw [name] [name] in August for following of his obstructive sleep apnea. He remains physically active and denies any chest pain. He recently had a repeat echo in October 2017 which showed a small improvement in LV  function with EF up to 35-40%. Heart failure regimen includes aspirin, carvedilol, Crestor, and spironolactone. He is not currently on a ARB is he's had orthostatic symptoms in the past although that his generally resolved. His creatinine is normal.  08/24/2016  I saw Mr. Gottesman today in follow-up. He again gained about 4 pounds over the past month. He felt that the weight gain was heart failure related because he got more short of breath particularly walking up stairs. Based on that he increased his Lasix back to 40 mg daily. He notes that his urine is been darker and his urination is not necessarily improved. He's also been more dizzy. He says he gets somewhat presyncopal with change in position. Lab work 12 days ago indicated a stable creatinine however. Despite this, I feel that he may be somewhat presyncopal perhaps on too high of a dose of diuretics. He is also on Entresto 24/26 and Aldactone 12.5 mg daily. He also complained of some pain across the back between the shoulder blades. He said this got better with resting and drinking water. It's difficult to say whether that it was the water or perhaps resting from his activities. I cannot exclude that this could be angina. His EKG however is reassuring today.  09/21/2016  Mr. Swango was seen today in follow-up. I decreased his Lasix, however he does not feel any change in his dizziness. There is no evidence of presyncope. He has gained about 4 pounds which I suspect is some fluid retention. Blood pressure is well-controlled today 118/56.  02/26/2017  Mr. Hebel returns today for follow-up. He had more recent dizziness and it seemed to worsen on Entresto. He discontinued that and feels that he's now much better. Overall he says he feels well. He is not on ACE inhibitor or ARB but remains on carvedilol. Is also on Lasix 40 mg daily. Blood pressure is stable 122/52.  08/10/2017  Mr. Barrowman returns for follow-up.  Overall he seems to be feeling very  well.  Denies any chest pain or worsening shortness of breath.  He gets occasional dizziness but that is much improved.  Of note an EKG was performed again today and there is a suggestion that there may be underlying atrial flutter.  Heart rate was 74 and EKG looks very similar to his prior EKG.  The computer has interpreted a sinus rhythm with first-degree AV block and bifascicular block.  I compared EKG to his previous EKG last August which looks similar however it is distinctly different from the  EKG last of February.  He is asymptomatic, but not anticoagulated.  Otherwise, labs reviewed from October indicate total cholesterol 134, HDL 36 LDL 67 and triglycerides 157.  Hemoglobin A1c of 7.6 and serum creatinine 0.9.  08/30/2017  Mr. Mehmedovic returns today for follow-up of his studies.  He underwent an echocardiogram to evaluate for change in LVEF, but more importantly to rule out atrial flutter.  His EKG was abnormal and suggested possible atrial flutter, however he was asymptomatic.  The echo did not show any evidence of atrial flutter.  LVEF was stable at 40%.  He also underwent a home 48-hour Holter monitor.  This demonstrated sinus rhythm with PACs and PVCs, but no evidence of atrial fibrillation or flutter.  Remains asymptomatic.  His only other complaint today is chronic leg pain.  He was seen by Dr. Gwenlyn Found and evaluated with lower extremity arterial Dopplers.  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  A referral to neurosurgery was suggested but not placed.  01/24/2018  Mr. Mandl returns today for follow-up.  Overall he says he is doing really well.  He underwent 2 back injections and has had improvement in his back pain and leg pain.  He had an episode in June where he became somewhat dehydrated.  He presented to the ER with orthostatic hypotension.  His Lasix was cut back to 20 mg daily.  Weight is stayed stable effect is been improved from 252-246.  He says  he is actually had enough energy to play golf recently.  08/01/2018  Mr. Dobos seen today in routine follow-up.  He has some occasional dizziness which is persistent.  He says he gets a little lightheaded when he urinates.  He has been off of tamsulosin with no specific benefit and now is taking the medication every third day.  Blood pressure is stable.  Weight is stable.  He denies any chest pain or worsening shortness of breath with exertion.  His last echo showed an EF of 40% which is stable if not slightly improved from an echo in 2017.  Unfortunately he could not tolerate Entresto and remains on carvedilol.  He endorses NYHA class I-II heart failure symptoms.  Recent lab work showed total cholesterol 151, HDL 39, LDL 79 triglycerides 166.  Hemoglobin A1c of 7.3.  03/30/2019  Mr. Kevorkian seen today in follow-up.  Overall he is doing well and has no complaints.  He has not recently struggled with any of the dizziness he has had previously.  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  In the past he has had EKG changes that were questionable for possible atrial flutter however monitoring as well as an echocardiogram failed to show any evidence of atrial flutter or fibrillation.  This is now clear finding of atrial fibrillation on EKG with an irregularly irregular rhythm with no evidence of P waves.  He reports being asymptomatic with this.  05/12/2019   Mr. Sarma returns today for follow-up of his A. fib.  He reports he has been compliant with Eliquis.  He says he had a couple episodes of loose stools.  He thought it might be due to the medicine but this seems quite atypical.  He stopped some of his stool softener/bulking agents.  Weight is down about 3 pounds.  He increased his Lasix because he felt like it was a little wheezy.  Possibly could have had some more heart failure because he  is in A. fib and does have a history of heart failure.  EKG  again shows atrial flutter/fibrillation with variable AV response at 74.  Since this is presumably persistent at this point we discussed an elective cardioversion and he is agreeable to this.  06/13/2019  Mr. Chirdon was seen today in follow-up.  He underwent cardioversion by myself in November.  Since then he has felt fairly well.  His EKG today shows that he is maintaining sinus rhythm.  Blood pressure was 119/63 however he has had some positional dizziness at home.  His daughter notes he has had some blood pressures with low diastolics less than 50 and he does feel some dizziness.  He also feels some fatigue when exerting himself a lot.  I suspect this could be due to hypotension.  Is possible also that his EF has improved now that he is back in sinus and that he may be over diuresed being on both Lasix and Aldactone.  09/15/2019  RandallCoole returns for follow-up.  He reports improvement in his orthostatic symptoms after stopping Aldactone and his PCP also stopped tamsulosin.  He is now on some saw palmetto for BPH.  Unfortunately he does get a little bit more short of breath and I wonder if this is due to need for more diuretic.  His LVEF was 40% last year however has not been reassessed.  I would like to get an echo to recheck that.  PMHx:  Past Medical History:  Diagnosis Date  . Arthritis    Knee L - probably  . BPH (benign prostatic hyperplasia)   . CAD (coronary artery disease) 07/06/2011  . CHF (congestive heart failure) ([place])   . Coronary artery disease   . Diabetes mellitus   . Frequency of urination   . High cholesterol   . History of nuclear stress test 09/2010   dipyridamole; mild perfusion defect due to attenuation with mild superimposed ischemia in apical septal, apical, apical inferior & apical lateral regions; rest LV enlarged in size; prominent gut uptake in infero-apical region; no significant ischemia demonstrated; low risk scan   . HNP (herniated nucleus pulposus), lumbar     . Ischemic cardiomyopathy   . LV dysfunction 07/06/2011  . Nocturia   . Right bundle branch block   . 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 Ellison Hughs  . Sleep apnea    sleep study 10/2010- AHI during total sleep 32.1/hr and during REM 62.3/hr (severe sleep apnea)unable to tolerate c pap  . Spinal stenosis of lumbar region   . Stroke [facility])    [place]     Past Surgical History:  Procedure Laterality Date  . CARDIAC CATHETERIZATION  01/2011   ischemic cardiomyopathy 30-35%, multivessel CAD (Dr. [name])   . CARDIAC CATHETERIZATION  09/13/2015   Procedure: Right/Left Heart Cath and Coronary/Graft Angiography;  Surgeon: [name], MD;  Location: [place] CV LAB;  Service: Cardiovascular;;  . CARDIOVERSION N/A 05/16/2019   Procedure: CARDIOVERSION;  Surgeon: [name], MD;  Location: [facility] ENDOSCOPY;  Service: Cardiovascular;  Laterality: N/A;  . Colonscopy    . CORONARY ARTERY BYPASS GRAFT  08/03/2011   Procedure: CORONARY ARTERY BYPASS GRAFTING (CABG);  Surgeon: [name], MD;  Location: [place];  Service: Open Heart Surgery;  Laterality: N/A;  CABG times three using right saphenous vein and left mammary artery usisng endoscope.  Marland Kitchen LEFT  HEART CATHETERIZATION WITH CORONARY/GRAFT ANGIOGRAM N/A 09/20/2013   Procedure: LEFT HEART CATHETERIZATION WITH [name];  Surgeon: [name], MD;  Location: [place] CATH LAB;  Service: Cardiovascular;  Laterality: N/A;  . Lower Extremity Arterial Doppler  03/16/2013   bilat ABIs demonstated normal values; R runoff - posterior tibial & anterior tibial arteries occluded; L runoff - peroneal & posterior tibial arteries occluded, anterior tibial artery appears occluded  . LUMBAR LAMINECTOMY/DECOMPRESSION MICRODISCECTOMY N/A 09/12/2014   Procedure: LUMBAR DECOMPRESSION L3-L4, L4-L5, MICRODISCECTOMY L4-L5;  Surgeon: [name], MD;  Location: [facility]  ORS;  Service: Orthopedics;  Laterality: N/A;  . Pilonidal Cyst removed    . TRANSTHORACIC ECHOCARDIOGRAM  11/10/2012   EF 40-45%, mild LVH, mild hypokinesis of anteroseptal myocardium, grade 1 diastolic dysfunction; mild MR & calcifed mitral annulus; LA mild-mod dilated; RA mildly dilated    FAMHx:  Family History  Problem Relation Age of Onset  . Anesthesia problems Daughter   . Cancer Mother   . Lung disease Father        & heart disease  . Colon cancer Sister   . Sudden death Maternal Grandmother     SOCHx:   reports that he quit smoking about 8 years ago. His smoking use included cigars. He quit after 5.00 years of use. He quit smokeless tobacco use about 7 years ago. He reports current alcohol use. He reports that he does not use drugs.  ALLERGIES:  Allergies  Allergen Reactions  . Entresto [Sacubitril-Valsartan] Other (See Comments)    dizziness    ROS: Pertinent items noted in HPI and remainder of comprehensive ROS otherwise negative.  HOME MEDS: Current Outpatient Medications  Medication Sig Dispense Refill  . Alogliptin Benzoate 12.5 MG TABS Take 12.5 mg by mouth every evening.     Marland Kitchen apixaban (ELIQUIS) 5 MG TABS tablet Take 1 tablet (5 mg total) by mouth 2 (two) times daily. 60 tablet 11  . aspirin EC 81 MG tablet Take 81 mg by mouth at bedtime.     . carvedilol (COREG) 6.25 MG tablet Take 1 tablet by mouth twice daily (Patient taking differently: Take 6.25 mg by mouth 2 (two) times daily. ) 180 tablet 3  . Cholecalciferol (VITAMIN D) 50 MCG (2000 UT) tablet Take 2,000 Units by mouth at bedtime.    . furosemide (LASIX) 40 MG tablet Take 1 tablet by mouth once daily (Patient taking differently: Take 40 mg by mouth daily. ) 90 tablet 0  . gabapentin (NEURONTIN) 300 MG capsule Take 300 mg by mouth at bedtime.     Marland Kitchen glipiZIDE (GLUCOTROL XL) 10 MG 24 hr tablet Take 10 mg by mouth at bedtime.     . metFORMIN (GLUCOPHAGE-XR) 500 MG 24 hr tablet Take 1,000 mg by mouth 2  (two) times daily.    Glory Rosebush VERIO test strip 1 each daily.    . rosuvastatin (CRESTOR) 20 MG tablet Take 10 mg by mouth every other day.     . tamsulosin (FLOMAX) 0.4 MG CAPS capsule Take 0.4 mg by mouth every evening.      No current facility-administered medications for this visit.    LABS/IMAGING: No results found for this or any previous visit (from the past 48 hour(s)). No results found.  VITALS: BP 132/76   Pulse 77   Temp (!) 97.5 F (36.4 C)   Ht 6\' 2"  (1.88 m)   Wt 248 lb (112.5 kg)   SpO2 96%   BMI 31.84 kg/m   EXAM:  General appearance: alert and no distress Neck: no carotid bruit, no JVD and thyroid not enlarged, symmetric, no tenderness/mass/nodules Lungs: clear to auscultation bilaterally Heart: irregularly irregular rhythm Abdomen: soft, non-tender; bowel sounds normal; no masses,  no organomegaly Extremities: extremities normal, atraumatic, no cyanosis or edema Pulses: 2+ and symmetric Skin: Skin color, texture, turgor normal. No rashes or lesions Neurologic: Grossly normal Psych: Pleasant  EKG: Sinus rhythm first-degree AV block with PACs, right bundle branch block-personally reviewed  ASSESSMENT: 1. Orthostatic hypotension 2. New onset atrial fibrillation, CHADSVASC score of 5, status post DCCV (04/2019) 3. Chronic systolic congestive heart failure - EF 40% (07/2017) 4. Coronary artery disease status post three-vessel CABG in 2013 (LIMA to LAD, SVG to PDA and SVG to PLA) - patent grafts by cath in 08/2015 5. Hypertension-controlled 6. Dyslipidemia 7. Diabetes type 2 - better controlled 8. Mild PVD-with normal ABI 9. Hypotension/upper back pain - resolved 10. 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 OSA-back on BiPAP  PLAN: 1.   Mr. Heaton says that he has had some improvement in his orthostatic hypotension however he might be a little more short of breath after stopping Aldactone.  He is on 40 mg daily.  We will plan a repeat  echo as his last EF was 40% more than a year ago.  If his EF is improved, then I would recommend we continue our current treatments.  If the EF is similar lower and he still dyspneic, we may need to make a small increase in his Lasix dose.  He is also complaining of some low back pain today asked which medications over-the-counter would be okay to take.  Certainly I feel he could comfortably take Salonpas, or had recommended OTC Voltaren gel which is available now.  He could also use a topical capsaicin or Biofreeze which are short acting remedies.  Follow-up with me in 6 months or sooner as necessary.  [name], MD, [facility], [place] Director of the [facility] Lipid Disorders &  Cardiovascular Risk Reduction [facility] Diplomate of the American Board of Clinical Lipidology Attending Cardiologist  Direct Dial: [phone]  Fax: [phone]  Website:  [email or web address] Glennice Marcos 09/15/2019, 1:53 PM

## 2019-09-15 NOTE — Patient Instructions (Signed)
Medication Instructions:  Your physician recommends that you continue on your current medications as directed. Please refer to the Current Medication list given to you today.  OTC -  [place] Voltaren Gel Capsicin cream   *If you need a refill on your cardiac medications before your next appointment, please call your pharmacy*   Lab Work: NONE If you have labs (blood work) drawn today and your tests are completely normal, you will receive your results only by: Marland Kitchen MyChart Message (if you have MyChart) OR . A paper copy in the mail If you have any lab test that is abnormal or we need to change your treatment, we will call you to review the results.   Testing/Procedures: Echocardiogram to be scheduled at [place]: At [facility], you and your health needs are our priority.  As part of our continuing mission to provide you with exceptional heart care, we have created designated Provider Care Teams.  These Care Teams include your primary Cardiologist (physician) and Advanced Practice Providers (APPs -  Physician Assistants and Nurse Practitioners) who all work together to provide you with the care you need, when you need it.  We recommend signing up for the patient portal called "MyChart".  Sign up information is provided on this After Visit Summary.  MyChart is used to connect with patients for Virtual Visits (Telemedicine).  Patients are able to view lab/test results, encounter notes, upcoming appointments, etc.  Non-urgent messages can be sent to your provider as well.   To learn more about what you can do with MyChart, go to [email or web address].    Your next appointment:   6 month(s)  The format for your next appointment:   In Person  Provider:   You may see [name], MD or one of the following Advanced Practice Providers on your designated Care Team:    Almyra Deforest, PA-C  Fabian Sharp, PA-C or   Roby Lofts, [place]    Other Instructions

## 2019-09-20 ENCOUNTER — Ambulatory Visit (HOSPITAL_COMMUNITY)
Admission: RE | Admit: 2019-09-20 | Discharge: 2019-09-20 | Disposition: A | Discharge status: Home / Self Care | Payer: Medicare Other | Source: Ambulatory Visit | Attending: Internal Medicine | Admitting: Internal Medicine

## 2019-09-20 ENCOUNTER — Other Ambulatory Visit: Payer: Self-pay

## 2019-09-20 DIAGNOSIS — R0602 Shortness of breath: Secondary | ICD-10-CM | POA: Diagnosis not present

## 2019-09-20 DIAGNOSIS — Z951 Presence of aortocoronary bypass graft: Secondary | ICD-10-CM | POA: Diagnosis not present

## 2019-09-20 DIAGNOSIS — I255 Ischemic cardiomyopathy: Secondary | ICD-10-CM | POA: Diagnosis not present

## 2019-09-20 MED ORDER — PERFLUTREN LIPID MICROSPHERE
1.0000 mL | INTRAVENOUS | Status: AC | PRN
  Administered 2019-09-20: 1 mL via INTRAVENOUS
  Filled 2019-09-20: qty 10

## 2019-09-20 NOTE — Progress Notes (Signed)
*  PRELIMINARY RESULTS* Echocardiogram 2D Echocardiogram has been performed.  Leavy Cella 09/20/2019, 4:25 PM

## 2019-09-21 ENCOUNTER — Telehealth: Payer: Self-pay | Admitting: Internal Medicine

## 2019-09-21 MED ORDER — FUROSEMIDE 40 MG PO TABS: 40.0000 mg | ORAL_TABLET | Freq: Two times a day (BID) | ORAL | 3 refills | Status: DC

## 2019-09-21 NOTE — Telephone Encounter (Signed)
Spoke with patient. Reviewed results of Echo and Dr. Lysbeth Penner recommendations. New prescription for Lasix 40mg  BID sent to preferred pharmacy.

## 2019-09-21 NOTE — Telephone Encounter (Signed)
Patient returning call for echo results. 

## 2019-10-04 ENCOUNTER — Other Ambulatory Visit: Payer: Self-pay | Admitting: Internal Medicine

## 2019-10-23 DIAGNOSIS — I739 Peripheral vascular disease, unspecified: Secondary | ICD-10-CM | POA: Diagnosis not present

## 2019-10-23 DIAGNOSIS — M79672 Pain in left foot: Secondary | ICD-10-CM | POA: Diagnosis not present

## 2019-10-23 DIAGNOSIS — M79671 Pain in right foot: Secondary | ICD-10-CM | POA: Diagnosis not present

## 2019-10-23 DIAGNOSIS — L110 Acquired keratosis follicularis: Secondary | ICD-10-CM | POA: Diagnosis not present

## 2019-10-23 DIAGNOSIS — E1140 Type 2 diabetes mellitus with diabetic neuropathy, unspecified: Secondary | ICD-10-CM | POA: Diagnosis not present

## 2019-11-07 DIAGNOSIS — M79672 Pain in left foot: Secondary | ICD-10-CM | POA: Diagnosis not present

## 2019-11-07 DIAGNOSIS — M7732 Calcaneal spur, left foot: Secondary | ICD-10-CM | POA: Diagnosis not present

## 2019-11-07 DIAGNOSIS — M722 Plantar fascial fibromatosis: Secondary | ICD-10-CM | POA: Diagnosis not present

## 2019-11-30 DIAGNOSIS — M79672 Pain in left foot: Secondary | ICD-10-CM | POA: Diagnosis not present

## 2019-11-30 DIAGNOSIS — M7732 Calcaneal spur, left foot: Secondary | ICD-10-CM | POA: Diagnosis not present

## 2019-11-30 DIAGNOSIS — M722 Plantar fascial fibromatosis: Secondary | ICD-10-CM | POA: Diagnosis not present

## 2019-12-29 ENCOUNTER — Telehealth: Payer: Self-pay | Admitting: Internal Medicine

## 2019-12-29 DIAGNOSIS — Z79899 Other long term (current) drug therapy: Secondary | ICD-10-CM

## 2019-12-29 NOTE — Telephone Encounter (Signed)
Patient advised of med change per MD and recommendations. BMET ordered, will be completed at [facility] in [place] - locations provided to patient

## 2019-12-29 NOTE — Telephone Encounter (Signed)
Spoke with pt who report his pcp is concerned with recent increase in his kidney function. Nurse informed pt that we are unable to see results but advised to have PCP fax results to [phone] for MD to review. Pt verbalized understanding.

## 2019-12-29 NOTE — Telephone Encounter (Signed)
Labs from 12/21/2019 in [place]  BUN 24 Creat 1.28 eGFR 54  Message sent to MD to review

## 2019-12-29 NOTE — Telephone Encounter (Signed)
[name] [name] is calling stating his PCP is concerned with his kidney function going from 3 to 84. Alvester Chou and the PCP would like to know what Dr. Debara Pickett advises.

## 2019-12-29 NOTE — Telephone Encounter (Signed)
Encourage to stay hydrated - could reduced lasix to 40 mg QAM and 20 mg in the afternoon. Should have follow-up BMET in 1-2 weeks after this change.  Dr Lemmie Evens

## 2020-01-04 ENCOUNTER — Telehealth: Payer: Self-pay

## 2020-01-22 DIAGNOSIS — Z79899 Other long term (current) drug therapy: Secondary | ICD-10-CM | POA: Diagnosis not present

## 2020-01-23 DIAGNOSIS — M79672 Pain in left foot: Secondary | ICD-10-CM | POA: Diagnosis not present

## 2020-01-23 DIAGNOSIS — I739 Peripheral vascular disease, unspecified: Secondary | ICD-10-CM | POA: Diagnosis not present

## 2020-01-23 DIAGNOSIS — L110 Acquired keratosis follicularis: Secondary | ICD-10-CM | POA: Diagnosis not present

## 2020-01-23 DIAGNOSIS — M79671 Pain in right foot: Secondary | ICD-10-CM | POA: Diagnosis not present

## 2020-01-23 DIAGNOSIS — E1140 Type 2 diabetes mellitus with diabetic neuropathy, unspecified: Secondary | ICD-10-CM | POA: Diagnosis not present

## 2020-01-23 LAB — BASIC METABOLIC PANEL WITH GFR
BUN/Creatinine Ratio: 16 (ref 10–24)
BUN: 17 mg/dL (ref 8–27)
CO2: 22 mmol/L (ref 20–29)
Calcium: 8.8 mg/dL (ref 8.6–10.2)
Chloride: 102 mmol/L (ref 96–106)
Creatinine, Ser: 1.05 mg/dL (ref 0.76–1.27)
GFR calc Af Amer: 80 mL/min/{1.73_m2}
GFR calc non Af Amer: 69 mL/min/{1.73_m2}
Glucose: 231 mg/dL — ABNORMAL HIGH (ref 65–99)
Potassium: 4.2 mmol/L (ref 3.5–5.2)
Sodium: 140 mmol/L (ref 134–144)

## 2020-01-31 DIAGNOSIS — Z961 Presence of intraocular lens: Secondary | ICD-10-CM | POA: Diagnosis not present

## 2020-01-31 DIAGNOSIS — E119 Type 2 diabetes mellitus without complications: Secondary | ICD-10-CM | POA: Diagnosis not present

## 2020-02-15 DIAGNOSIS — Z6830 Body mass index (BMI) 30.0-30.9, adult: Secondary | ICD-10-CM | POA: Diagnosis not present

## 2020-02-15 DIAGNOSIS — L03119 Cellulitis of unspecified part of limb: Secondary | ICD-10-CM | POA: Diagnosis not present

## 2020-02-16 DIAGNOSIS — M9904 Segmental and somatic dysfunction of sacral region: Secondary | ICD-10-CM | POA: Diagnosis not present

## 2020-02-16 DIAGNOSIS — M4726 Other spondylosis with radiculopathy, lumbar region: Secondary | ICD-10-CM | POA: Diagnosis not present

## 2020-02-16 DIAGNOSIS — M9903 Segmental and somatic dysfunction of lumbar region: Secondary | ICD-10-CM | POA: Diagnosis not present

## 2020-02-16 DIAGNOSIS — M5442 Lumbago with sciatica, left side: Secondary | ICD-10-CM | POA: Diagnosis not present

## 2020-02-16 DIAGNOSIS — M7918 Myalgia, other site: Secondary | ICD-10-CM | POA: Diagnosis not present

## 2020-02-16 DIAGNOSIS — M9905 Segmental and somatic dysfunction of pelvic region: Secondary | ICD-10-CM | POA: Diagnosis not present

## 2020-02-16 DIAGNOSIS — M5416 Radiculopathy, lumbar region: Secondary | ICD-10-CM | POA: Diagnosis not present

## 2020-02-20 DIAGNOSIS — E113293 Type 2 diabetes mellitus with mild nonproliferative diabetic retinopathy without macular edema, bilateral: Secondary | ICD-10-CM | POA: Diagnosis not present

## 2020-02-28 DIAGNOSIS — M9905 Segmental and somatic dysfunction of pelvic region: Secondary | ICD-10-CM | POA: Diagnosis not present

## 2020-02-28 DIAGNOSIS — M5416 Radiculopathy, lumbar region: Secondary | ICD-10-CM | POA: Diagnosis not present

## 2020-02-28 DIAGNOSIS — M9903 Segmental and somatic dysfunction of lumbar region: Secondary | ICD-10-CM | POA: Diagnosis not present

## 2020-02-28 DIAGNOSIS — M7918 Myalgia, other site: Secondary | ICD-10-CM | POA: Diagnosis not present

## 2020-02-28 DIAGNOSIS — M9904 Segmental and somatic dysfunction of sacral region: Secondary | ICD-10-CM | POA: Diagnosis not present

## 2020-02-28 DIAGNOSIS — M4726 Other spondylosis with radiculopathy, lumbar region: Secondary | ICD-10-CM | POA: Diagnosis not present

## 2020-02-28 DIAGNOSIS — M5442 Lumbago with sciatica, left side: Secondary | ICD-10-CM | POA: Diagnosis not present

## 2020-03-11 ENCOUNTER — Telehealth: Payer: Self-pay | Admitting: Cardiology

## 2020-03-11 NOTE — Telephone Encounter (Signed)
New Message   Patient is calling because his Bipap humidfier is no loonger working. He spoke with [facility] and they states that he needs a new rx to get a new machine because he has had that one 5-6 years. Please contact patient

## 2020-03-12 NOTE — Telephone Encounter (Signed)
Appointment made for 03/15/20 for replacement cpap.

## 2020-03-13 DIAGNOSIS — M4726 Other spondylosis with radiculopathy, lumbar region: Secondary | ICD-10-CM | POA: Diagnosis not present

## 2020-03-13 DIAGNOSIS — M9903 Segmental and somatic dysfunction of lumbar region: Secondary | ICD-10-CM | POA: Diagnosis not present

## 2020-03-13 DIAGNOSIS — M9905 Segmental and somatic dysfunction of pelvic region: Secondary | ICD-10-CM | POA: Diagnosis not present

## 2020-03-13 DIAGNOSIS — M5442 Lumbago with sciatica, left side: Secondary | ICD-10-CM | POA: Diagnosis not present

## 2020-03-13 DIAGNOSIS — M9904 Segmental and somatic dysfunction of sacral region: Secondary | ICD-10-CM | POA: Diagnosis not present

## 2020-03-13 DIAGNOSIS — M5416 Radiculopathy, lumbar region: Secondary | ICD-10-CM | POA: Diagnosis not present

## 2020-03-13 DIAGNOSIS — M7918 Myalgia, other site: Secondary | ICD-10-CM | POA: Diagnosis not present

## 2020-03-14 NOTE — Progress Notes (Signed)
Virtual Visit via Telephone Note   This visit type was conducted due to national recommendations for restrictions regarding the COVID-19 Pandemic (e.g. social distancing) in an effort to limit this patient's exposure and mitigate transmission in our community.  Due to his co-morbid illnesses, this patient is at least at moderate risk for complications without adequate follow up.  This format is felt to be most appropriate for this patient at this time.  The patient did not have access to video technology/had technical difficulties with video requiring transitioning to audio format only (telephone).  All issues noted in this document were discussed and addressed.  No physical exam could be performed with this format.  Please refer to the patient's chart for his  consent to telehealth for [facility].   Evaluation Performed:  Follow-up visit  This visit type was conducted due to national recommendations for restrictions regarding the COVID-19 Pandemic (e.g. social distancing).  This format is felt to be most appropriate for this patient at this time.  All issues noted in this document were discussed and addressed.  No physical exam was performed (except for noted visual exam findings with Video Visits).  Please refer to the patient's chart (MyChart message for video visits and phone note for telephone visits) for the patient's consent to telehealth for [facility].  Date:  03/15/2020   ID:  [name] [name], DOB 04/08/1946, MRN [id number]  Patient Location:  Home  Provider location:   [place]  PCP:  [name] [name], MD  Cardiologist:  [name], MD  Sleep Medicine:  Fransico Him, MD Electrophysiologist:  None   Chief Complaint:  OSA  History of Present Illness:    [name] [name] is a 74 y.o. male who presents via audio/video conferencing for a telehealth visit today.    [name] [name] is a 74 y.o. male with a history of CAD, DM and CHF who also has OSA and has been on BiPAP  with issues with noncompliance in the past.  He was referred by Dr. Debara Pickett due to complaints of significant fatigue and has had a hard time sleeping at night.  His sleep study showed severe OSA with an AHI of 60/hr with a mild component of central sleep apnea and oxygen desaturations as low as 82%.  He underwent unsuccessful CPAP titration due to continued respiratory events and underwent BIPAP titration to 13/9cm H2O.    He is doing well with his CPAP device and thinks that he has gotten used to it.  He tolerates the mask and feels the pressure is adequate.  Since going on CPAP he feels rested in the am and has no significant daytime sleepiness until the humidified stopped working.  He has been having problems with dry mouth for the past few weeks.  He says that his humidifier is not working and now waking up with dry mouth and not sleeping well.  He puts water in at night and then in the am it is still there. He has talked with his supplier and was told that he needed to see me to get a new device.   The patient does not have symptoms concerning for COVID-19 infection (fever, chills, cough, or new shortness of breath).    Prior CV studies:   The following studies were reviewed today:  Prior OV notes  Past Medical History:  Diagnosis Date  . Arthritis    Knee L - probably  . BPH (benign prostatic hyperplasia)   . CAD (  coronary artery disease) 07/06/2011  . CHF (congestive heart failure) ([place])   . Coronary artery disease   . Diabetes mellitus   . Frequency of urination   . High cholesterol   . History of nuclear stress test 09/2010   dipyridamole; mild perfusion defect due to attenuation with mild superimposed ischemia in apical septal, apical, apical inferior & apical lateral regions; rest LV enlarged in size; prominent gut uptake in infero-apical region; no significant ischemia demonstrated; low risk scan   . HNP (herniated nucleus pulposus), lumbar   . Ischemic cardiomyopathy   . LV  dysfunction 07/06/2011  . Nocturia   . Right bundle branch block   . 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 Ellison Hughs  . Sleep apnea    sleep study 10/2010- AHI during total sleep 32.1/hr and during REM 62.3/hr (severe sleep apnea)unable to tolerate c pap  . Spinal stenosis of lumbar region   . Stroke [facility])    [place]    Past Surgical History:  Procedure Laterality Date  . CARDIAC CATHETERIZATION  01/2011   ischemic cardiomyopathy 30-35%, multivessel CAD (Dr. [name])   . CARDIAC CATHETERIZATION  09/13/2015   Procedure: Right/Left Heart Cath and Coronary/Graft Angiography;  Surgeon: [name], MD;  Location: [facility];  Service: Cardiovascular;;  . CARDIOVERSION N/A 05/16/2019   Procedure: CARDIOVERSION;  Surgeon: [name], MD;  Location: [facility] ENDOSCOPY;  Service: Cardiovascular;  Laterality: N/A;  . Colonscopy    . CORONARY ARTERY BYPASS GRAFT  08/03/2011   Procedure: CORONARY ARTERY BYPASS GRAFTING (CABG);  Surgeon: [name], MD;  Location: [place];  Service: Open Heart Surgery;  Laterality: N/A;  CABG times three using right saphenous vein and left mammary artery usisng endoscope.  Marland Kitchen LEFT HEART CATHETERIZATION WITH CORONARY/GRAFT ANGIOGRAM N/A 09/20/2013   Procedure: LEFT HEART CATHETERIZATION WITH [name];  Surgeon: [name], MD;  Location: [facility] CATH LAB;  Service: Cardiovascular;  Laterality: N/A;  . Lower Extremity Arterial Doppler  03/16/2013   bilat ABIs demonstated normal values; R runoff - posterior tibial & anterior tibial arteries occluded; L runoff - peroneal & posterior tibial arteries occluded, anterior tibial artery appears occluded  . LUMBAR LAMINECTOMY/DECOMPRESSION MICRODISCECTOMY N/A 09/12/2014   Procedure: LUMBAR DECOMPRESSION L3-L4, L4-L5, MICRODISCECTOMY L4-L5;  Surgeon: [name], MD;  Location: [facility] ORS;  Service: Orthopedics;  Laterality:  N/A;  . Pilonidal Cyst removed    . TRANSTHORACIC ECHOCARDIOGRAM  11/10/2012   EF 40-45%, mild LVH, mild hypokinesis of anteroseptal myocardium, grade 1 diastolic dysfunction; mild MR & calcifed mitral annulus; LA mild-mod dilated; RA mildly dilated     Current Meds  Medication Sig  . Alogliptin Benzoate 12.5 MG TABS Take 12.5 mg by mouth every evening.   Marland Kitchen apixaban (ELIQUIS) 5 MG TABS tablet Take 1 tablet (5 mg total) by mouth 2 (two) times daily.  Marland Kitchen aspirin EC 81 MG tablet Take 81 mg by mouth at bedtime.   . carvedilol (COREG) 6.25 MG tablet Take 1 tablet by mouth twice daily  . Cholecalciferol (VITAMIN D) 50 MCG (2000 UT) tablet Take 2,000 Units by mouth at bedtime.  . furosemide (LASIX) 40 MG tablet Take 40mg  by mouth in the morning and 20mg  in the afternoon.  . gabapentin (NEURONTIN) 300 MG capsule Take 300 mg by mouth at bedtime.   Marland Kitchen glipiZIDE (GLUCOTROL XL) 10 MG 24 hr tablet Take  10 mg by mouth at bedtime.   . metFORMIN (GLUCOPHAGE-XR) 500 MG 24 hr tablet Take 1,000 mg by mouth 2 (two) times daily.  Glory Rosebush VERIO test strip 1 each daily.  . rosuvastatin (CRESTOR) 20 MG tablet Take 10 mg by mouth every other day.      Allergies:   Entresto [sacubitril-valsartan]   Social History   Tobacco Use  . Smoking status: Former Smoker    Years: 5.00    Types: Cigars    Quit date: 09/07/2011    Years since quitting: 8.5  . Smokeless tobacco: Former Systems developer    Quit date: 02/28/2012  Substance Use Topics  . Alcohol use: Yes    Alcohol/week: 0.0 standard drinks    Comment: occasional  . Drug use: No     Family Hx: The patient's family history includes Anesthesia problems in his daughter; Cancer in his mother; Colon cancer in his sister; Lung disease in his father; Sudden death in his maternal grandmother.  ROS:   Please see the history of present illness.     All other systems reviewed and are negative.   Labs/Other Tests and Data Reviewed:    Recent Labs: 05/12/2019:  Platelets 272 05/16/2019: Hemoglobin 13.3 01/22/2020: BUN 17; Creatinine, Ser 1.05; Potassium 4.2; Sodium 140   Recent Lipid Panel No results found for: CHOL, TRIG, HDL, CHOLHDL, LDLCALC, LDLDIRECT  Wt Readings from Last 3 Encounters:  03/15/20 240 lb (108.9 kg)  09/15/19 248 lb (112.5 kg)  06/13/19 238 lb 9.6 oz (108.2 kg)     Objective:    Vital Signs:  BP (!) 127/55   Pulse 68   Ht 6\' 2"  (1.88 m)   Wt 240 lb (108.9 kg)   BMI 30.81 kg/m     ASSESSMENT & PLAN:    1.  OSA -  The patient is tolerating PAP therapy well but his humidifier in his device is no longer working. The patient has been using and benefiting from PAP use and will continue to benefit from therapy.  -I will order a new ResMed BiPAP at 13/9cm H2O  -followup with me in 8 weeks  2.  HTN -BP controlled on exam -continue Carvedilol 6.25mg  BID, spio 12.5mg  daily  3.  Obesity -He is exercising and watching his diet  COVID-19 Education: The signs and symptoms of COVID-19 were discussed with the patient and how to seek care for testing (follow up with PCP or arrange E-visit).  The importance of social distancing was discussed today.  Patient Risk:   After full review of this patient's clinical status, I feel that they are at least moderate risk at this time.  Time:   Today, I have spent 20 minutes on telemedicine discussing medical problems including OSA, HTN, obesity and reviewing patient's chart including prior sleep.  Medication Adjustments/Labs and Tests Ordered: Current medicines are reviewed at length with the patient today.  Concerns regarding medicines are outlined above.  Tests Ordered: No orders of the defined types were placed in this encounter.  Medication Changes: No orders of the defined types were placed in this encounter.   Disposition:  Follow up in 8 week(s) after getting new BiPAP  Signed, Fransico Him, MD  03/15/2020 8:37 AM    [facility]

## 2020-03-15 ENCOUNTER — Telehealth: Payer: Self-pay | Admitting: *Deleted

## 2020-03-15 ENCOUNTER — Telehealth (INDEPENDENT_AMBULATORY_CARE_PROVIDER_SITE_OTHER): Payer: Medicare Other | Admitting: Cardiology

## 2020-03-15 ENCOUNTER — Encounter: Payer: Self-pay | Admitting: Cardiology

## 2020-03-15 ENCOUNTER — Other Ambulatory Visit: Payer: Self-pay

## 2020-03-15 VITALS — BP 127/55 | HR 68 | Ht 74.0 in | Wt 240.0 lb

## 2020-03-15 DIAGNOSIS — I10 Essential (primary) hypertension: Secondary | ICD-10-CM

## 2020-03-15 DIAGNOSIS — I255 Ischemic cardiomyopathy: Secondary | ICD-10-CM

## 2020-03-15 DIAGNOSIS — G4733 Obstructive sleep apnea (adult) (pediatric): Secondary | ICD-10-CM

## 2020-03-15 NOTE — Telephone Encounter (Signed)
-----   Message from Sueanne Margarita, MD sent at 03/15/2020  8:39 AM EDT ----- Patient's BiPAP is broke - please order a ResMed BiPAP 13/9cm H2O with heated humidity and mask of choice and followup with me in 8 weeks

## 2020-03-15 NOTE — Telephone Encounter (Signed)
Order placed to [employer] via community message. 

## 2020-03-15 NOTE — Patient Instructions (Signed)
Medication Instructions:  Your physician recommends that you continue on your current medications as directed. Please refer to the Current Medication list given to you today.  *If you need a refill on your cardiac medications before your next appointment, please call your pharmacy*   Lab Work: none If you have labs (blood work) drawn today and your tests are completely normal, you will receive your results only by:  [place] (if you have MyChart) OR  A paper copy in the mail If you have any lab test that is abnormal or we need to change your treatment, we will call you to review the results.   Testing/Procedures: none   Follow-Up: At [facility], you and your health needs are our priority.  As part of our continuing mission to provide you with exceptional heart care, we have created designated Provider Care Teams.  These Care Teams include your primary Cardiologist (physician) and Advanced Practice Providers (APPs -  Physician Assistants and Nurse Practitioners) who all work together to provide you with the care you need, when you need it.  We recommend signing up for the patient portal called "MyChart".  Sign up information is provided on this After Visit Summary.  MyChart is used to connect with patients for Virtual Visits (Telemedicine).  Patients are able to view lab/test results, encounter notes, upcoming appointments, etc.  Non-urgent messages can be sent to your provider as well.   To learn more about what you can do with MyChart, go to [email or web address].    Your next appointment:   8 week(s)--We will contact you to schedule this appointment  The format for your next appointment:   virtual  Provider:   Fransico Him, MD   Other Instructions

## 2020-03-20 ENCOUNTER — Other Ambulatory Visit: Payer: Self-pay | Admitting: Internal Medicine

## 2020-03-20 NOTE — Telephone Encounter (Signed)
Refill Request.  

## 2020-03-25 ENCOUNTER — Ambulatory Visit (INDEPENDENT_AMBULATORY_CARE_PROVIDER_SITE_OTHER): Payer: Medicare Other | Admitting: Internal Medicine

## 2020-03-25 ENCOUNTER — Other Ambulatory Visit: Payer: Self-pay

## 2020-03-25 ENCOUNTER — Encounter: Payer: Self-pay | Admitting: Internal Medicine

## 2020-03-25 VITALS — BP 132/68 | HR 66 | Ht 74.0 in | Wt 233.8 lb

## 2020-03-25 DIAGNOSIS — M9905 Segmental and somatic dysfunction of pelvic region: Secondary | ICD-10-CM | POA: Diagnosis not present

## 2020-03-25 DIAGNOSIS — M9903 Segmental and somatic dysfunction of lumbar region: Secondary | ICD-10-CM | POA: Diagnosis not present

## 2020-03-25 DIAGNOSIS — Z951 Presence of aortocoronary bypass graft: Secondary | ICD-10-CM | POA: Diagnosis not present

## 2020-03-25 DIAGNOSIS — M4726 Other spondylosis with radiculopathy, lumbar region: Secondary | ICD-10-CM | POA: Diagnosis not present

## 2020-03-25 DIAGNOSIS — I255 Ischemic cardiomyopathy: Secondary | ICD-10-CM | POA: Diagnosis not present

## 2020-03-25 DIAGNOSIS — M9904 Segmental and somatic dysfunction of sacral region: Secondary | ICD-10-CM | POA: Diagnosis not present

## 2020-03-25 DIAGNOSIS — Z79899 Other long term (current) drug therapy: Secondary | ICD-10-CM | POA: Diagnosis not present

## 2020-03-25 DIAGNOSIS — I10 Essential (primary) hypertension: Secondary | ICD-10-CM | POA: Diagnosis not present

## 2020-03-25 DIAGNOSIS — M5416 Radiculopathy, lumbar region: Secondary | ICD-10-CM | POA: Diagnosis not present

## 2020-03-25 DIAGNOSIS — M7918 Myalgia, other site: Secondary | ICD-10-CM | POA: Diagnosis not present

## 2020-03-25 DIAGNOSIS — I5022 Chronic systolic (congestive) heart failure: Secondary | ICD-10-CM | POA: Diagnosis not present

## 2020-03-25 DIAGNOSIS — M5442 Lumbago with sciatica, left side: Secondary | ICD-10-CM | POA: Diagnosis not present

## 2020-03-25 MED ORDER — LOSARTAN POTASSIUM 25 MG PO TABS: 25.0000 mg | ORAL_TABLET | Freq: Every day | ORAL | 3 refills | Status: DC

## 2020-03-25 NOTE — Progress Notes (Signed)
OFFICE NOTE  Chief Complaint:  Follow-up heart failure  Primary Care Physician: [name] [name], MD  HPI:  [name] [name] is a pleasant 74 year old gentleman previously followed by Dr. Rollene Fare. His past medical history is significant for coronary artery disease. In 2013 he underwent multivessel CABG for an ischemic cardiomyopathy. EF was 20-25% prior to surgery however post surgery his EF had improved up to 45-50%. Recently his EF was 40-45% by echo in May of 2014. There was mild mitral regurgitation, mild to moderately dilated left atrium and mild LVH. [name] [name] has been describing some anxiety. He also reports some pain in his legs however underwent Dopplers in September 2014 which showed preserved ABIs bilaterally a 1.0 on the right and 1.1 on the left. The bilateral peroneal and posterior tibial arteries were occluded. He reports some optimal control of his diabetes. His A1c was 7.2. He is concerned about the cost of taking Tradjenta. He is followed by Dr. Elyse Hsu.   He was recently seen in the emergency room and he can hospital. There he presented with hypotension and was given 1 L normal saline and his symptoms improved. He had been having dizziness as well as some upper back pain into the left shoulder blade with exertion recently. The symptoms are worse particularly when climbing up ladders or going up stairs and are relieved at rest. After that hospitalization it was recommended that he discontinue his ACE inhibitor and beta blocker, and his blood pressure appears improved today up to 110/60.  [name] [name] returns today for followup of his stress test. This is interpreted as nonischemic with an EF of 44%. He reports still feeling very fatigued and extremely short of breath when doing activities. He says that the other day he tried to hit golf balls and felt that he was totally exhausted after just an hour. He reports his blood pressure has improved somewhat off of all medications  however remains low. He has had symptoms of orthostatic hypotension in the past. His symptoms do feel similar to prior to his bypass surgery. He also feels that he was much better after surgery than he is now and that his symptoms have been going on for the past 2 months.  At his last office visit, I recommended that [name] [name] have a repeat cardiac catheterization (08/2013). This demonstrated the following:  Impression:  1. 2 vessel native CAD with occluded LAD in the mid-vessel  2. Patent LIMA-LAD, SVG to PLA and SVG to PDA grafts with TIMI III flow  3. LVEDP = 13 mmHg  4. Random PVC's were noted  Based on these findings, I could not find any new cardiac cause of his symptoms. I suspect that some of his fatigue could be related to labile blood sugars. He says unexplained hypotension but is normotensive off of medications.  [name] [name] returns today for followup. He now reports feeling better since he had change in his medications. He is established with Dr. Buddy Duty who adjusted his diabetes medicines and stopped his Invokana. His shortness of breath and fatigue in both improved. He is now been expected some problems with left heel pain. He was found to have some spinal stenosis but has had improvement with inserts in his shoes.  I saw [name] [name] back in the office today. He has successfully underwent back surgery earlier this year which she says initially was much improved, however he says he subsequently developed more pain going down his legs. This sounds like  it's neuropathic pain, but is reluctant to try medication for it. He also significantly fatigue. This could be due to a number of etiologies. 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 Also, he is noted to have a history of obstructive sleep apnea. He was placed on BiPAP, but has not been compliant with that due to difficulty wearing the mask.  He also never had follow-up of his sleep study.  [name] [name] returns today for follow-up. He reports he's had worsening dyspnea and fatigue. He says that he can hardly sleep at night. He is struggling with significant anxiety. 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 He's not been wearing the BiPAP due to significant anxiety and difficulty sleeping. He feels like he gets smothered with his machine. Communication between Dr. Radford Pax and Dr. Quintin Alto led to the starting of Celexa as well as Klonopin to use as needed at night. He says that it does give him about 6 hours of sleep. He has not been using his BiPAP as instructed. He is reportedly had more worsening shortness of breath. He was seen in the ER for this and it was thought this was due [email or web address] with BiPAP. His BNP however is elevated over 300. Today in the office he says that he's had more swelling and more abdominal fullness as well as leg edema.  I had the pleasure seeing [name] [name] back today in the office. He seems to respond nicely to Lasix. I started him on 40 mg daily and while I was out of town my partner reviewed his lab work which included an elevated BNP over 300. He increased his Lasix to 40 mg twice a day. [name] [name] says he's had a significant improvement in his breathing. The BNP now is in the mid 200s. Renal function is stable based on labs today. Unfortunately, his echocardiogram does show a new cardiomyopathy with EF 30-35%. It should be noted that he felt "awful" on blood pressure/heart failure medicines and had taken himself off of ACE inhibitor and beta blocker in the past. The echo however does suggest inferior and anterolateral wall motion abnormalities which are new and could indicate graft dysfunction. I performed his last heart catheterization in 2015 which showed all 3 grafts patent.  [name] [name] returns today for follow-up of his left heart catheterization. I  perform this a few weeks ago and he had patent bypass grafts however LV function is reduced. Cardiac output is also reduced. Filling pressures were mildly elevated. He reports since discharge that he's had some improvement of his symptoms. He was started on low-dose beta blocker but he does report fatigue. When I asked him how they compared to previously he said he thinks attack sleep better since he started on the beta blocker but not back to what he thinks his baseline should be. He continues to have problems with left leg pain. He had ultrasound of the left leg which were unrevealing. He also had nerve conduction studies which I sent him for which were not suggestive of neuropathy. His symptoms were worse while lying flat on the Cath Lab table and I suspect this could be again coming from his back and he may need repeat orthopedic evaluation. His primary care provider started him on Celexa recently as well for anxiety.  12/23/2015  [name] [name] was seen back in the  office today in follow-up. Overall he seems to be doing well. He's tolerating low-dose carvedilol and has had some very infrequent morning dizziness. He was previously on Lasix 40 mg twice a day but ran out of the medicine about 8 days ago. He's not had any worsening weight gain or swelling nor worsening shortness of breath over the past week. This could be that he's had some improvement in his cardiomyopathy. Nevertheless, I would like him to be on some Lasix at least until we can determine whether or not he is euvolemic with lab work.  05/27/2016  [name] [name] returns today for follow-up. He's gained about 6 pounds of weight since I last saw him. He denies any worsening shortness of breath or orthostatic dizziness. He saw Dr. Radford Pax in August for following of his obstructive sleep apnea. He remains physically active and denies any chest pain. He recently had a repeat echo in October 2017 which showed a small improvement in LV function with EF up  to 35-40%. Heart failure regimen includes aspirin, carvedilol, Crestor, and spironolactone. He is not currently on a ARB is he's had orthostatic symptoms in the past although that his generally resolved. His creatinine is normal.  08/24/2016  I saw [name] [name] today in follow-up. He again gained about 4 pounds over the past month. He felt that the weight gain was heart failure related because he got more short of breath particularly walking up stairs. Based on that he increased his Lasix back to 40 mg daily. He notes that his urine is been darker and his urination is not necessarily improved. He's also been more dizzy. He says he gets somewhat presyncopal with change in position. Lab work 12 days ago indicated a stable creatinine however. Despite this, I feel that he may be somewhat presyncopal perhaps on too high of a dose of diuretics. He is also on Entresto 24/26 and Aldactone 12.5 mg daily. He also complained of some pain across the back between the shoulder blades. He said this got better with resting and drinking water. It's difficult to say whether that it was the water or perhaps resting from his activities. I cannot exclude that this could be angina. His EKG however is reassuring today.  09/21/2016  [name] [name] was seen today in follow-up. I decreased his Lasix, however he does not feel any change in his dizziness. There is no evidence of presyncope. He has gained about 4 pounds which I suspect is some fluid retention. Blood pressure is well-controlled today 118/56.  02/26/2017  [name] [name] returns today for follow-up. He had more recent dizziness and it seemed to worsen on Entresto. He discontinued that and feels that he's now much better. Overall he says he feels well. He is not on ACE inhibitor or ARB but remains on carvedilol. Is also on Lasix 40 mg daily. Blood pressure is stable 122/52.  08/10/2017  [name] [name] returns for follow-up.  Overall he seems to be feeling very well.  Denies any chest  pain or worsening shortness of breath.  He gets occasional dizziness but that is much improved.  Of note an EKG was performed again today and there is a suggestion that there may be underlying atrial flutter.  Heart rate was 74 and EKG looks very similar to his prior EKG.  The computer has interpreted a sinus rhythm with first-degree AV block and bifascicular block.  I compared EKG to his previous EKG last August which looks similar however it is distinctly different from the  EKG last of February.  He is asymptomatic, but not anticoagulated.  Otherwise, labs reviewed from October indicate total cholesterol 134, HDL 36 LDL 67 and triglycerides 157.  Hemoglobin A1c of 7.6 and serum creatinine 0.9.  08/30/2017  [name] [name] returns today for follow-up of his studies.  He underwent an echocardiogram to evaluate for change in LVEF, but more importantly to rule out atrial flutter.  His EKG was abnormal and suggested possible atrial flutter, however he was asymptomatic.  The echo did not show any evidence of atrial flutter.  LVEF was stable at 40%.  He also underwent a home 48-hour Holter monitor.  This demonstrated sinus rhythm with PACs and PVCs, but no evidence of atrial fibrillation or flutter.  Remains asymptomatic.  His only other complaint today is chronic leg pain.  He was seen by Dr. Gwenlyn Found and evaluated with lower extremity arterial Dopplers.  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  A referral to neurosurgery was suggested but not placed.  01/24/2018  Mr. Ferrall returns today for follow-up.  Overall he says he is doing really well.  He underwent 2 back injections and has had improvement in his back pain and leg pain.  He had an episode in June where he became somewhat dehydrated.  He presented to the ER with orthostatic hypotension.  His Lasix was cut back to 20 mg daily.  Weight is stayed stable effect is been improved from 252-246.  He says he is actually had  enough energy to play golf recently.  08/01/2018  Mr. Qin seen today in routine follow-up.  He has some occasional dizziness which is persistent.  He says he gets a little lightheaded when he urinates.  He has been off of tamsulosin with no specific benefit and now is taking the medication every third day.  Blood pressure is stable.  Weight is stable.  He denies any chest pain or worsening shortness of breath with exertion.  His last echo showed an EF of 40% which is stable if not slightly improved from an echo in 2017.  Unfortunately he could not tolerate Entresto and remains on carvedilol.  He endorses NYHA class I-II heart failure symptoms.  Recent lab work showed total cholesterol 151, HDL 39, LDL 79 triglycerides 166.  Hemoglobin A1c of 7.3.  03/30/2019  Mr. Isbell seen today in follow-up.  Overall he is doing well and has no complaints.  He has not recently struggled with any of the dizziness he has had previously.  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  In the past he has had EKG changes that were questionable for possible atrial flutter however monitoring as well as an echocardiogram failed to show any evidence of atrial flutter or fibrillation.  This is now clear finding of atrial fibrillation on EKG with an irregularly irregular rhythm with no evidence of P waves.  He reports being asymptomatic with this.  05/12/2019   Mr. Sedivy returns today for follow-up of his A. fib.  He reports he has been compliant with Eliquis.  He says he had a couple episodes of loose stools.  He thought it might be due to the medicine but this seems quite atypical.  He stopped some of his stool softener/bulking agents.  Weight is down about 3 pounds.  He increased his Lasix because he felt like it was a little wheezy.  Possibly could have had some more heart failure because he  is in A. fib and does have a history of heart failure.  EKG again shows atrial  flutter/fibrillation with variable AV response at 74.  Since this is presumably persistent at this point we discussed an elective cardioversion and he is agreeable to this.  06/13/2019  Mr. Mcgowan was seen today in follow-up.  He underwent cardioversion by myself in November.  Since then he has felt fairly well.  His EKG today shows that he is maintaining sinus rhythm.  Blood pressure was 119/63 however he has had some positional dizziness at home.  His daughter notes he has had some blood pressures with low diastolics less than 50 and he does feel some dizziness.  He also feels some fatigue when exerting himself a lot.  I suspect this could be due to hypotension.  Is possible also that his EF has improved now that he is back in sinus and that he may be over diuresed being on both Lasix and Aldactone.  09/15/2019  RandallRegis returns for follow-up.  He reports improvement in his orthostatic symptoms after stopping Aldactone and his PCP also stopped tamsulosin.  He is now on some saw palmetto for BPH.  Unfortunately he does get a little bit more short of breath and I wonder if this is due to need for more diuretic.  His LVEF was 40% last year however has not been reassessed.  I would like to get an echo to recheck that.  03/25/2020  Mr. Elgersma is seen today for 38-month follow-up.  He denies any further orthostatic symptoms.  He was not feeling well on Aldactone.  He was also taken off tamsulosin.  He is only on carvedilol for heart failure and unfortunately his LVEF declined further in March down to 5 to 40%, previously 40 to 45%.  I had increased his Lasix due to signs of increased LV filling pressure however then he became a little bit prerenal.  His Lasix was then decreased to 40 mg every morning and 20 mg every afternoon.  Since then he has been asymptomatic with heart failure at most with NYHA class II symptoms.  Is been struggling with issues with the CPAP and that the humidifier broke.  This is a  [employer] device and he was told by the company that they are no longer manufacturing them rather they are making ventilators because of COVID-19.  Lipid profile in June 2021 showed total cholesterol 168, HDL 32, triglycerides 106 and 116.  A1c was 7.7 and both remain above goal.  PMHx:  Past Medical History:  Diagnosis Date  . Arthritis    Knee L - probably  . BPH (benign prostatic hyperplasia)   . CAD (coronary artery disease) 07/06/2011  . CHF (congestive heart failure) ([place])   . Coronary artery disease   . Diabetes mellitus   . Frequency of urination   . High cholesterol   . History of nuclear stress test 09/2010   dipyridamole; mild perfusion defect due to attenuation with mild superimposed ischemia in apical septal, apical, apical inferior & apical lateral regions; rest LV enlarged in size; prominent gut uptake in infero-apical region; no significant ischemia demonstrated; low risk scan   . HNP (herniated nucleus pulposus), lumbar   . Ischemic cardiomyopathy   . LV dysfunction 07/06/2011  . Nocturia   . Right bundle branch block   . 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 Ellison Hughs  .  Sleep apnea    sleep study 10/2010- AHI during total sleep 32.1/hr and during REM 62.3/hr (severe sleep apnea)unable to tolerate c pap  . Spinal stenosis of lumbar region   . Stroke [facility])    [place]     Past Surgical History:  Procedure Laterality Date  . CARDIAC CATHETERIZATION  01/2011   ischemic cardiomyopathy 30-35%, multivessel CAD (Dr. [name])   . CARDIAC CATHETERIZATION  09/13/2015   Procedure: Right/Left Heart Cath and Coronary/Graft Angiography;  Surgeon: [name], MD;  Location: [place] CV LAB;  Service: Cardiovascular;;  . CARDIOVERSION N/A 05/16/2019   Procedure: CARDIOVERSION;  Surgeon: [name], MD;  Location: [facility] ENDOSCOPY;  Service: Cardiovascular;  Laterality: N/A;  . Colonscopy     . CORONARY ARTERY BYPASS GRAFT  08/03/2011   Procedure: CORONARY ARTERY BYPASS GRAFTING (CABG);  Surgeon: [name], MD;  Location: [facility];  Service: Open Heart Surgery;  Laterality: N/A;  CABG times three using right saphenous vein and left mammary artery usisng endoscope.  Marland Kitchen LEFT HEART CATHETERIZATION WITH CORONARY/GRAFT ANGIOGRAM N/A 09/20/2013   Procedure: LEFT HEART CATHETERIZATION WITH [name];  Surgeon: [name], MD;  [facility] C[facility] LAB;  Service: Cardiovascular;  Laterality: N/A;  . Lower Extremity Arterial Doppler  03/16/2013   bilat ABIs demonstated normal values; R runoff - posterior tibial & anterior tibial arteries occluded; L runoff - peroneal & posterior tibial arteries occluded, anterior tibial artery appears occluded  . LUMBAR LAMINECTOMY/DECOMPRESSION MICRODISCECTOMY N/A 09/12/2014   Procedure: LUMBAR DECOMPRESSION L3-L4, L4-L5, MICRODISCECTOMY L4-L5;  Surgeon: [name], MD;  Location: [facility] ORS;  Service: Orthopedics;  Laterality: N/A;  . Pilonidal Cyst removed    . TRANSTHORACIC ECHOCARDIOGRAM  11/10/2012   EF 40-45%, mild LVH, mild hypokinesis of anteroseptal myocardium, grade 1 diastolic dysfunction; mild MR & calcifed mitral annulus; LA mild-mod dilated; RA mildly dilated    FAMHx:  Family History  Problem Relation Age of Onset  . Anesthesia problems Daughter   . Cancer Mother   . Lung disease Father        & heart disease  . Colon cancer Sister   . Sudden death Maternal Grandmother     SOCHx:   reports that he quit smoking about 8 years ago. His smoking use included cigars. He quit after 5.00 years of use. He quit smokeless tobacco use about 8 years ago. He reports current alcohol use. He reports that he does not use drugs.  ALLERGIES:  Allergies  Allergen Reactions  . Entresto [Sacubitril-Valsartan] Other (See Comments)    dizziness    ROS: Pertinent items noted in HPI and remainder of comprehensive ROS otherwise  negative.  HOME MEDS: Current Outpatient Medications  Medication Sig Dispense Refill  . Alogliptin Benzoate 12.5 MG TABS Take 12.5 mg by mouth every evening.     Marland Kitchen aspirin EC 81 MG tablet Take 81 mg by mouth at bedtime.     . carvedilol (COREG) 6.25 MG tablet Take 1 tablet by mouth twice daily 180 tablet 3  . Cholecalciferol (VITAMIN D) 50 MCG (2000 UT) tablet Take 2,000 Units by mouth at bedtime.    Marland Kitchen ELIQUIS 5 MG TABS tablet Take 1 tablet by mouth twice daily 180 tablet 1  . furosemide (LASIX) 40 MG tablet Take 40mg  by mouth in the morning and 20mg  in the afternoon.    . gabapentin (NEURONTIN) 300 MG capsule Take 300 mg by mouth at bedtime.     Marland Kitchen glipiZIDE (GLUCOTROL XL)  10 MG 24 hr tablet Take 10 mg by mouth at bedtime.     . metFORMIN (GLUCOPHAGE-XR) 500 MG 24 hr tablet Take 1,000 mg by mouth 2 (two) times daily.    Glory Rosebush VERIO test strip 1 each daily.    . rosuvastatin (CRESTOR) 20 MG tablet Take 10 mg by mouth every other day.      No current facility-administered medications for this visit.    LABS/IMAGING: No results found for this or any previous visit (from the past 48 hour(s)). No results found.  VITALS: BP 132/68   Pulse 66   Ht 6\' 2"  (1.88 m)   Wt 233 lb 12.8 oz (106.1 kg)   BMI 30.02 kg/m   EXAM: General appearance: alert and no distress Neck: no carotid bruit, no JVD and thyroid not enlarged, symmetric, no tenderness/mass/nodules Lungs: clear to auscultation bilaterally Heart: irregularly irregular rhythm Abdomen: soft, non-tender; bowel sounds normal; no masses,  no organomegaly Extremities: extremities normal, atraumatic, no cyanosis or edema Pulses: 2+ and symmetric Skin: Skin color, texture, turgor normal. No rashes or lesions Neurologic: Grossly normal Psych: Pleasant  EKG: Sinus rhythm first-degree AV block, bifascicular block at 66-personally reviewed  ASSESSMENT: 1. Orthostatic hypotension-resolved 2. New onset atrial fibrillation,  CHADSVASC score of 5, status post DCCV (04/2019) 3. Chronic systolic congestive heart failure - EF 40% (07/2017) 4. Coronary artery disease status post three-vessel CABG in 2013 (LIMA to LAD, SVG to PDA and SVG to PLA) - patent grafts by cath in 08/2015 5. Hypertension-controlled 6. Dyslipidemia 7. Diabetes type 2 - better controlled 8. Mild PVD-with normal ABI 9. Hypotension/upper back pain - resolved 10. 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 OSA-back on BiPAP  PLAN: 1.   Mr. Kalas has had no further orthostatic hypotension and it may have been due to his Aldactone or tamsulosin.  Unfortunately his LVEF is declined further and I would like to further treat him for heart failure.  He was intolerant to [facility].  He cannot take the Aldactone.  He is currently on carvedilol.  I do think there is room for low-dose ARB as his renal function is normal.  Start losartan 25 mg daily.  We will repeat a metabolic profile in about 2 weeks.  Follow-up with me in 6 months or sooner as necessary.  [name], MD, [facility] [place] Director of the Advanced Lipid Disorders &  Cardiovascular Risk Reduction Clinic Diplomate of the American Board of Clinical Lipidology Attending Cardiologist  Direct Dial: [phone]  Fax: [phone]  Website:  [email or web address]  [name] Ori Kreiter 03/25/2020, 8:12 AM

## 2020-03-25 NOTE — Patient Instructions (Signed)
Medication Instructions:  Dr. Debara Pickett has prescribed losartan 25mg  daily Continue all other current medications  *If you need a refill on your cardiac medications before your next appointment, please call your pharmacy*   Lab Work: BMET in 2 weeks (at any [employer] [facility]) If you have labs (blood work) drawn today and your tests are completely normal, you will receive your results only by: Marland Kitchen MyChart Message (if you have MyChart) OR . A paper copy in the mail If you have any lab test that is abnormal or we need to change your treatment, we will call you to review the results.   Testing/Procedures: NONE   Follow-Up: At [facility], you and your health needs are our priority.  As part of our continuing mission to provide you with exceptional heart care, we have created designated Provider Care Teams.  These Care Teams include your primary Cardiologist (physician) and Advanced Practice Providers (APPs -  Physician Assistants and Nurse Practitioners) who all work together to provide you with the care you need, when you need it.  We recommend signing up for the patient portal called "MyChart".  Sign up information is provided on this After Visit Summary.  MyChart is used to connect with patients for Virtual Visits (Telemedicine).  Patients are able to view lab/test results, encounter notes, upcoming appointments, etc.  Non-urgent messages can be sent to your provider as well.   To learn more about what you can do with MyChart, go to [email or web address].    Your next appointment:   6 month(s)  The format for your next appointment:   In Person  Provider:   You may see [name], MD or one of the following Advanced Practice Providers on your designated Care Team:    Almyra Deforest, PA-C  Fabian Sharp, PA-C or   Roby Lofts, [place]    Other Instructions

## 2020-04-09 DIAGNOSIS — I739 Peripheral vascular disease, unspecified: Secondary | ICD-10-CM | POA: Diagnosis not present

## 2020-04-09 DIAGNOSIS — L110 Acquired keratosis follicularis: Secondary | ICD-10-CM | POA: Diagnosis not present

## 2020-04-09 DIAGNOSIS — M79671 Pain in right foot: Secondary | ICD-10-CM | POA: Diagnosis not present

## 2020-04-09 DIAGNOSIS — E1140 Type 2 diabetes mellitus with diabetic neuropathy, unspecified: Secondary | ICD-10-CM | POA: Diagnosis not present

## 2020-04-09 DIAGNOSIS — M79672 Pain in left foot: Secondary | ICD-10-CM | POA: Diagnosis not present

## 2020-04-17 DIAGNOSIS — E782 Mixed hyperlipidemia: Secondary | ICD-10-CM | POA: Diagnosis not present

## 2020-04-17 DIAGNOSIS — E1122 Type 2 diabetes mellitus with diabetic chronic kidney disease: Secondary | ICD-10-CM | POA: Diagnosis not present

## 2020-04-17 DIAGNOSIS — Z205 Contact with and (suspected) exposure to viral hepatitis: Secondary | ICD-10-CM | POA: Diagnosis not present

## 2020-04-17 DIAGNOSIS — E1165 Type 2 diabetes mellitus with hyperglycemia: Secondary | ICD-10-CM | POA: Diagnosis not present

## 2020-04-17 DIAGNOSIS — E7800 Pure hypercholesterolemia, unspecified: Secondary | ICD-10-CM | POA: Diagnosis not present

## 2020-04-17 DIAGNOSIS — I10 Essential (primary) hypertension: Secondary | ICD-10-CM | POA: Diagnosis not present

## 2020-04-17 DIAGNOSIS — E1140 Type 2 diabetes mellitus with diabetic neuropathy, unspecified: Secondary | ICD-10-CM | POA: Diagnosis not present

## 2020-04-17 DIAGNOSIS — N183 Chronic kidney disease, stage 3 unspecified: Secondary | ICD-10-CM | POA: Diagnosis not present

## 2020-04-24 DIAGNOSIS — E1165 Type 2 diabetes mellitus with hyperglycemia: Secondary | ICD-10-CM | POA: Diagnosis not present

## 2020-04-24 DIAGNOSIS — E1122 Type 2 diabetes mellitus with diabetic chronic kidney disease: Secondary | ICD-10-CM | POA: Diagnosis not present

## 2020-04-24 DIAGNOSIS — Z23 Encounter for immunization: Secondary | ICD-10-CM | POA: Diagnosis not present

## 2020-04-24 DIAGNOSIS — I5033 Acute on chronic diastolic (congestive) heart failure: Secondary | ICD-10-CM | POA: Diagnosis not present

## 2020-04-24 DIAGNOSIS — I10 Essential (primary) hypertension: Secondary | ICD-10-CM | POA: Diagnosis not present

## 2020-04-24 DIAGNOSIS — R42 Dizziness and giddiness: Secondary | ICD-10-CM | POA: Diagnosis not present

## 2020-04-24 DIAGNOSIS — N401 Enlarged prostate with lower urinary tract symptoms: Secondary | ICD-10-CM | POA: Diagnosis not present

## 2020-04-24 DIAGNOSIS — I255 Ischemic cardiomyopathy: Secondary | ICD-10-CM | POA: Diagnosis not present

## 2020-05-06 DIAGNOSIS — M9904 Segmental and somatic dysfunction of sacral region: Secondary | ICD-10-CM | POA: Diagnosis not present

## 2020-05-06 DIAGNOSIS — M5416 Radiculopathy, lumbar region: Secondary | ICD-10-CM | POA: Diagnosis not present

## 2020-05-06 DIAGNOSIS — M9905 Segmental and somatic dysfunction of pelvic region: Secondary | ICD-10-CM | POA: Diagnosis not present

## 2020-05-06 DIAGNOSIS — M5442 Lumbago with sciatica, left side: Secondary | ICD-10-CM | POA: Diagnosis not present

## 2020-05-06 DIAGNOSIS — M7918 Myalgia, other site: Secondary | ICD-10-CM | POA: Diagnosis not present

## 2020-05-06 DIAGNOSIS — M4726 Other spondylosis with radiculopathy, lumbar region: Secondary | ICD-10-CM | POA: Diagnosis not present

## 2020-05-06 DIAGNOSIS — M9903 Segmental and somatic dysfunction of lumbar region: Secondary | ICD-10-CM | POA: Diagnosis not present

## 2020-05-10 ENCOUNTER — Ambulatory Visit: Payer: Medicare Other | Admitting: Urology

## 2020-05-17 ENCOUNTER — Encounter: Payer: Self-pay | Admitting: Urology

## 2020-05-17 ENCOUNTER — Ambulatory Visit (INDEPENDENT_AMBULATORY_CARE_PROVIDER_SITE_OTHER): Payer: Medicare Other | Admitting: Urology

## 2020-05-17 ENCOUNTER — Other Ambulatory Visit: Payer: Self-pay

## 2020-05-17 VITALS — BP 122/64 | HR 73 | Temp 98.2°F | Ht 74.0 in | Wt 235.0 lb

## 2020-05-17 DIAGNOSIS — R3912 Poor urinary stream: Secondary | ICD-10-CM | POA: Diagnosis not present

## 2020-05-17 DIAGNOSIS — I255 Ischemic cardiomyopathy: Secondary | ICD-10-CM

## 2020-05-17 DIAGNOSIS — N400 Enlarged prostate without lower urinary tract symptoms: Secondary | ICD-10-CM | POA: Diagnosis not present

## 2020-05-17 LAB — URINALYSIS, ROUTINE W REFLEX MICROSCOPIC
Bilirubin, UA: NEGATIVE
Glucose, UA: NEGATIVE
Ketones, UA: NEGATIVE
Leukocytes,UA: NEGATIVE
Nitrite, UA: NEGATIVE
Protein,UA: NEGATIVE
RBC, UA: NEGATIVE
Specific Gravity, UA: 1.02 (ref 1.005–1.030)
Urobilinogen, Ur: 1 mg/dL (ref 0.2–1.0)
pH, UA: 5.5 (ref 5.0–7.5)

## 2020-05-17 LAB — BLADDER SCAN AMB NON-IMAGING: Scan Result: 79

## 2020-05-17 MED ORDER — ALFUZOSIN HCL ER 10 MG PO TB24: 10.0000 mg | ORAL_TABLET | Freq: Every day | ORAL | 11 refills | Status: DC

## 2020-05-17 NOTE — Patient Instructions (Signed)

## 2020-05-17 NOTE — Progress Notes (Signed)
05/17/2020 2:10 PM   [name] [name] 03-May-1946 [id number]  Referring provider: [name] [name], MD [place],  [place] [address]  Weak stream  HPI: Mr Kunin is a 74yo here for evaluation of weak stream. For the past 2 years he has noted a weaker stream, urinary urgency, urinary hesitancy and urinary frequency. He has starting/stopping He denies nocturia. He was tried on flomax which caused dizziness but did imporve his LUTS. No dysuria or hematuria. No other associated symptoms.    PMH: Past Medical History:  Diagnosis Date  . Arthritis    Knee L - probably  . Atrial fibrillation ([place])   . BPH (benign prostatic hyperplasia)   . CAD (coronary artery disease) 07/06/2011  . CHF (congestive heart failure) ([place])   . Coronary artery disease   . Diabetes mellitus   . Frequency of urination   . Heart failure ([place])   . Heart murmur   . High cholesterol   . History of nuclear stress test 09/2010   dipyridamole; mild perfusion defect due to attenuation with mild superimposed ischemia in apical septal, apical, apical inferior & apical lateral regions; rest LV enlarged in size; prominent gut uptake in infero-apical region; no significant ischemia demonstrated; low risk scan   . HNP (herniated nucleus pulposus), lumbar   . Ischemic cardiomyopathy   . LV dysfunction 07/06/2011  . Nocturia   . Right bundle branch block   . 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 Ellison Hughs  . Sleep apnea    sleep study 10/2010- AHI during total sleep 32.1/hr and during REM 62.3/hr (severe sleep apnea)unable to tolerate c pap  . Spinal stenosis of lumbar region   . Stroke [facility])    [place]     Surgical History: Past Surgical History:  Procedure Laterality Date  . CARDIAC CATHETERIZATION  01/2011   ischemic cardiomyopathy 30-35%, multivessel CAD (Dr. [name])   . CARDIAC CATHETERIZATION  09/13/2015   Procedure:  Right/Left Heart Cath and Coronary/Graft Angiography;  Surgeon: [name], MD;  Location: [place] CV LAB;  Service: Cardiovascular;;  . CARDIOVERSION N/A 05/16/2019   Procedure: CARDIOVERSION;  Surgeon: [name], MD;  Location: [facility] ENDOSCOPY;  Service: Cardiovascular;  Laterality: N/A;  . Colonscopy    . CORONARY ARTERY BYPASS GRAFT  08/03/2011   Procedure: CORONARY ARTERY BYPASS GRAFTING (CABG);  Surgeon: [name], MD;  Location: [place];  Service: Open Heart Surgery;  Laterality: N/A;  CABG times three using right saphenous vein and left mammary artery usisng endoscope.  Marland Kitchen LEFT HEART CATHETERIZATION WITH CORONARY/GRAFT ANGIOGRAM N/A 09/20/2013   Procedure: LEFT HEART CATHETERIZATION WITH [name];  Surgeon: [name], MD;  Location: [facility] CATH LAB;  Service: Cardiovascular;  Laterality: N/A;  . Lower Extremity Arterial Doppler  03/16/2013   bilat ABIs demonstated normal values; R runoff - posterior tibial & anterior tibial arteries occluded; L runoff - peroneal & posterior tibial arteries occluded, anterior tibial artery appears occluded  . LUMBAR LAMINECTOMY/DECOMPRESSION MICRODISCECTOMY N/A 09/12/2014   Procedure: LUMBAR DECOMPRESSION L3-L4, L4-L5, MICRODISCECTOMY L4-L5;  Surgeon: [name], MD;  Location: [facility];  Service: Orthopedics;  Laterality: N/A;  . Pilonidal Cyst removed    . TRANSTHORACIC ECHOCARDIOGRAM  11/10/2012   EF 40-45%, mild LVH, mild hypokinesis of anteroseptal myocardium, grade 1 diastolic dysfunction; mild MR & calcifed mitral annulus; LA mild-mod dilated; RA mildly  dilated    Home Medications:  Allergies as of 05/17/2020      Reactions   Entresto [sacubitril-valsartan] Other (See Comments)   dizziness      Medication List       Accurate as of May 17, 2020  2:10 PM. If you have any questions, ask your nurse or doctor.        Alogliptin Benzoate 12.5 MG Tabs Take 12.5 mg by mouth every evening.   aspirin EC 81 MG  tablet Take 81 mg by mouth at bedtime.   carvedilol 6.25 MG tablet Commonly known as: COREG Take 1 tablet by mouth twice daily   Eliquis 5 MG Tabs tablet Generic drug: apixaban Take 1 tablet by mouth twice daily   furosemide 40 MG tablet Commonly known as: LASIX Take 40mg  by mouth in the morning and 20mg  in the afternoon.   gabapentin 300 MG capsule Commonly known as: NEURONTIN Take 300 mg by mouth at bedtime.   glipiZIDE 10 MG 24 hr tablet Commonly known as: GLUCOTROL XL Take 10 mg by mouth at bedtime.   losartan 25 MG tablet Commonly known as: COZAAR Take 1 tablet (25 mg total) by mouth daily.   metFORMIN 500 MG 24 hr tablet Commonly known as: GLUCOPHAGE-XR Take 1,000 mg by mouth 2 (two) times daily.   OneTouch Verio test strip Generic drug: glucose blood 1 each daily.   rosuvastatin 20 MG tablet Commonly known as: CRESTOR Take 10 mg by mouth every other day.   rosuvastatin 5 MG tablet Commonly known as: CRESTOR Take 5 mg by mouth daily.   Vitamin D 50 MCG (2000 UT) tablet Take 2,000 Units by mouth at bedtime.       Allergies:  Allergies  Allergen Reactions  . Entresto [Sacubitril-Valsartan] Other (See Comments)    dizziness    Family History: Family History  Problem Relation Age of Onset  . Anesthesia problems Daughter   . Cancer Mother   . Lung disease Father        & heart disease  . Colon cancer Sister   . Sudden death Maternal Grandmother     Social History:  reports that he quit smoking about 8 years ago. His smoking use included cigars. He quit after 5.00 years of use. He quit smokeless tobacco use about 8 years ago. He reports current alcohol use. He reports that he does not use drugs.  ROS: All other review of systems were reviewed and are negative except what is noted above in HPI  Physical Exam: BP 122/64   Pulse 73   Temp 98.2 F (36.8 C)   Ht 6\' 2"  (1.88 m)   Wt 235 lb (106.6 kg)   BMI 30.17 kg/m   Constitutional:  Alert  and oriented, No acute distress. HEENT:  AT, moist mucus membranes.  Trachea midline, no masses. Cardiovascular: No clubbing, cyanosis, or edema. Respiratory: Normal respiratory effort, no increased work of breathing. GI: Abdomen is soft, nontender, nondistended, no abdominal masses GU: No CVA tenderness. Circumcised phallus. No masses/lesions on penis, testis, scrotum. Prostate 40g smooth no nodules no induration.  Lymph: No cervical or inguinal lymphadenopathy. Skin: No rashes, bruises or suspicious lesions. Neurologic: Grossly intact, no focal deficits, moving all 4 extremities. Psychiatric: Normal mood and affect.  Laboratory Data: Lab Results  Component Value Date   WBC 7.8 05/12/2019   HGB 13.3 05/16/2019   HCT 39.0 05/16/2019   MCV 84 05/12/2019   PLT 272 05/12/2019    Lab Results  Component Value Date   CREATININE 1.05 01/22/2020    No results found for: PSA  No results found for: TESTOSTERONE  Lab Results  Component Value Date   HGBA1C 7.3 (H) 02/20/2013    Urinalysis    Component Value Date/Time   COLORURINE YELLOW 12/19/2017 [place] 12/19/2017 2355   LABSPEC 1.019 12/19/2017 2355   PHURINE 5.0 12/19/2017 2355   GLUCOSEU 50 (A) 12/19/2017 2355   HGBUR NEGATIVE 12/19/2017 2355   BILIRUBINUR NEGATIVE 12/19/2017 2355   KETONESUR NEGATIVE 12/19/2017 2355   PROTEINUR NEGATIVE 12/19/2017 2355   UROBILINOGEN 0.2 08/04/2013 1448   NITRITE NEGATIVE 12/19/2017 2355   LEUKOCYTESUR NEGATIVE 12/19/2017 2355    Lab Results  Component Value Date   BACTERIA RARE 08/04/2013    Pertinent Imaging:  No results found for this or any previous visit.  No results found for this or any previous visit.  No results found for this or any previous visit.  No results found for this or any previous visit.  No results found for this or any previous visit.  No results found for this or any previous visit.  No results found for this or any previous  visit.  No results found for this or any previous visit.   Assessment & Plan:    1. Benign prostatic hyperplasia, unspecified whether lower urinary tract symptoms present -We will trial uroxtral 10mg   - Urinalysis, Routine w reflex microscopic - BLADDER SCAN AMB NON-IMAGING  2. Weak urinary stream -We will trial uroxatrla 10mg  qhs   No follow-ups on file.  Nicolette Bang, MD  [facility] [place]

## 2020-05-17 NOTE — Progress Notes (Signed)
Bladder Scan Patient can void: 73 ml Performed By: Birch Farino,LPN   Urological Symptom Review  Patient is experiencing the following symptoms: Hard to postpone urination Stream starts and stops Trouble starting stream Weak stream  Erection problems  Review of Systems  Gastrointestinal (upper)  : Negative for upper GI symptoms  Gastrointestinal (lower) : Negative for lower GI symptoms  Constitutional : Negative for symptoms  Skin: Negative for skin symptoms  Eyes: Negative for eye symptoms  Ear/Nose/Throat : Negative for Ear/Nose/Throat symptoms  Hematologic/Lymphatic: Negative for Hematologic/Lymphatic symptoms  Cardiovascular : Negative for cardiovascular symptoms  Respiratory : Negative for respiratory symptoms  Endocrine: Negative for endocrine symptoms  Musculoskeletal: Joint pain  Neurological: Dizziness  Psychologic: Negative for psychiatric symptoms

## 2020-05-18 ENCOUNTER — Encounter: Payer: Self-pay | Admitting: Urology

## 2020-06-03 DIAGNOSIS — M5442 Lumbago with sciatica, left side: Secondary | ICD-10-CM | POA: Diagnosis not present

## 2020-06-03 DIAGNOSIS — M4726 Other spondylosis with radiculopathy, lumbar region: Secondary | ICD-10-CM | POA: Diagnosis not present

## 2020-06-03 DIAGNOSIS — M9903 Segmental and somatic dysfunction of lumbar region: Secondary | ICD-10-CM | POA: Diagnosis not present

## 2020-06-03 DIAGNOSIS — M5416 Radiculopathy, lumbar region: Secondary | ICD-10-CM | POA: Diagnosis not present

## 2020-06-03 DIAGNOSIS — M7918 Myalgia, other site: Secondary | ICD-10-CM | POA: Diagnosis not present

## 2020-06-03 DIAGNOSIS — M9904 Segmental and somatic dysfunction of sacral region: Secondary | ICD-10-CM | POA: Diagnosis not present

## 2020-06-03 DIAGNOSIS — M9905 Segmental and somatic dysfunction of pelvic region: Secondary | ICD-10-CM | POA: Diagnosis not present

## 2020-06-18 DIAGNOSIS — M79671 Pain in right foot: Secondary | ICD-10-CM | POA: Diagnosis not present

## 2020-06-18 DIAGNOSIS — L110 Acquired keratosis follicularis: Secondary | ICD-10-CM | POA: Diagnosis not present

## 2020-06-18 DIAGNOSIS — E1140 Type 2 diabetes mellitus with diabetic neuropathy, unspecified: Secondary | ICD-10-CM | POA: Diagnosis not present

## 2020-06-18 DIAGNOSIS — I739 Peripheral vascular disease, unspecified: Secondary | ICD-10-CM | POA: Diagnosis not present

## 2020-06-18 DIAGNOSIS — M79672 Pain in left foot: Secondary | ICD-10-CM | POA: Diagnosis not present

## 2020-06-20 ENCOUNTER — Ambulatory Visit (INDEPENDENT_AMBULATORY_CARE_PROVIDER_SITE_OTHER): Payer: Medicare Other | Admitting: Urology

## 2020-06-20 ENCOUNTER — Encounter: Payer: Self-pay | Admitting: Urology

## 2020-06-20 ENCOUNTER — Other Ambulatory Visit: Payer: Self-pay

## 2020-06-20 VITALS — BP 103/67 | HR 72 | Temp 98.1°F | Ht 74.0 in | Wt 235.0 lb

## 2020-06-20 DIAGNOSIS — N400 Enlarged prostate without lower urinary tract symptoms: Secondary | ICD-10-CM

## 2020-06-20 DIAGNOSIS — N5201 Erectile dysfunction due to arterial insufficiency: Secondary | ICD-10-CM | POA: Diagnosis not present

## 2020-06-20 DIAGNOSIS — R3912 Poor urinary stream: Secondary | ICD-10-CM | POA: Diagnosis not present

## 2020-06-20 DIAGNOSIS — I255 Ischemic cardiomyopathy: Secondary | ICD-10-CM

## 2020-06-20 LAB — URINALYSIS, ROUTINE W REFLEX MICROSCOPIC
Bilirubin, UA: NEGATIVE
Ketones, UA: NEGATIVE
Leukocytes,UA: NEGATIVE
Nitrite, UA: NEGATIVE
Protein,UA: NEGATIVE
RBC, UA: NEGATIVE
Specific Gravity, UA: 1.02 (ref 1.005–1.030)
Urobilinogen, Ur: 1 mg/dL (ref 0.2–1.0)
pH, UA: 5.5 (ref 5.0–7.5)

## 2020-06-20 MED ORDER — TADALAFIL 20 MG PO TABS: 20.0000 mg | ORAL_TABLET | ORAL | 5 refills | Status: DC | PRN

## 2020-06-20 NOTE — Progress Notes (Signed)
Urological Symptom Review  Patient is experiencing the following symptoms: Erection problems (male only)   Review of Systems  Gastrointestinal (upper)  : Negative for upper GI symptoms  Gastrointestinal (lower) : Negative for lower GI symptoms  Constitutional : Fatigue  Skin: Negative for skin symptoms  Eyes: Negative for eye symptoms  Ear/Nose/Throat : Negative for Ear/Nose/Throat symptoms  Hematologic/Lymphatic: Easy bruising  Cardiovascular : Negative for cardiovascular symptoms  Respiratory : Shortness of breath  Endocrine: Negative for endocrine symptoms  Musculoskeletal: Negative for musculoskeletal symptoms  Neurological: Dizziness  Psychologic: Negative for psychiatric symptoms

## 2020-06-20 NOTE — Patient Instructions (Signed)

## 2020-06-20 NOTE — Progress Notes (Signed)
06/20/2020 12:01 PM   SAWYER MENTZER [birth year] [id number]  Referring provider: [name] [name], MD [place],  [place] [address]  Weak Urinary stream  HPI: [name] [name] is a 74yo here for followup for BPH and weak urinary stream. Last visit we started uroxatral 10mg  qhs and the patient noted significant improvement in his nocturia to 1x, urgency, frequency and his weak stream. Stream is strong. He denies hesistancy, starting/stopping, straining to urinate. No urge incontinence. Overall he is happy with his urination.  He has been having issues getting an erection for the past 2 years. His erections are semifirm and not firm enough for penetration. He has never tried a PDE5. He has good exercise tolerance.    PMH: Past Medical History:  Diagnosis Date  . Arthritis    Knee L - probably  . Atrial fibrillation ([place])   . BPH (benign prostatic hyperplasia)   . CAD (coronary artery disease) 07/06/2011  . CHF (congestive heart failure) ([place])   . Coronary artery disease   . Diabetes mellitus   . Frequency of urination   . Heart failure ([place])   . Heart murmur   . High cholesterol   . History of nuclear stress test 09/2010   dipyridamole; mild perfusion defect due to attenuation with mild superimposed ischemia in apical septal, apical, apical inferior & apical lateral regions; rest LV enlarged in size; prominent gut uptake in infero-apical region; no significant ischemia demonstrated; low risk scan   . HNP (herniated nucleus pulposus), lumbar   . Ischemic cardiomyopathy   . LV dysfunction 07/06/2011  . Nocturia   . Right bundle branch block   . 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 Ellison Hughs  . Sleep apnea    sleep study 10/2010- AHI during total sleep 32.1/hr and during REM 62.3/hr (severe sleep apnea)unable to tolerate c pap  . Spinal stenosis of lumbar region   . Stroke [facility])    [place]      Surgical History: Past Surgical History:  Procedure Laterality Date  . CARDIAC CATHETERIZATION  01/2011   ischemic cardiomyopathy 30-35%, multivessel CAD (Dr. [name])   . CARDIAC CATHETERIZATION  09/13/2015   Procedure: Right/Left Heart Cath and Coronary/Graft Angiography;  Surgeon: [name], MD;  Location: [place] CV LAB;  Service: Cardiovascular;;  . CARDIOVERSION N/A 05/16/2019   Procedure: CARDIOVERSION;  Surgeon: [name], MD;  Location: [facility] ENDOSCOPY;  Service: Cardiovascular;  Laterality: N/A;  . Colonscopy    . CORONARY ARTERY BYPASS GRAFT  08/03/2011   Procedure: CORONARY ARTERY BYPASS GRAFTING (CABG);  Surgeon: [name], MD;  Location: [place];  Service: Open Heart Surgery;  Laterality: N/A;  CABG times three using right saphenous vein and left mammary artery usisng endoscope.  Marland Kitchen LEFT HEART CATHETERIZATION WITH CORONARY/GRAFT ANGIOGRAM N/A 09/20/2013   Procedure: LEFT HEART CATHETERIZATION WITH [name];  Surgeon: [name], MD;  Location: [facility] CATH LAB;  Service: Cardiovascular;  Laterality: N/A;  . Lower Extremity Arterial Doppler  03/16/2013   bilat ABIs demonstated normal values; R runoff - posterior tibial & anterior tibial arteries occluded; L runoff - peroneal & posterior tibial arteries occluded, anterior tibial artery appears occluded  . LUMBAR LAMINECTOMY/DECOMPRESSION MICRODISCECTOMY N/A 09/12/2014   Procedure: LUMBAR DECOMPRESSION L3-L4, L4-L5, MICRODISCECTOMY L4-L5;  Surgeon: [name], MD;  Location: [facility];  Service: Orthopedics;  Laterality: N/A;  .  Pilonidal Cyst removed    . TRANSTHORACIC ECHOCARDIOGRAM  11/10/2012   EF 40-45%, mild LVH, mild hypokinesis of anteroseptal myocardium, grade 1 diastolic dysfunction; mild [name] & calcifed mitral annulus; LA mild-mod dilated; RA mildly dilated    Home Medications:  Allergies as of 06/20/2020      Reactions   Entresto [sacubitril-valsartan] Other (See Comments)   dizziness       Medication List       Accurate as of June 20, 2020 12:01 PM. If you have any questions, ask your nurse or doctor.        alfuzosin 10 MG 24 hr tablet Commonly known as: UROXATRAL Take 1 tablet (10 mg total) by mouth daily with breakfast.   Alogliptin Benzoate 12.5 MG Tabs Take 12.5 mg by mouth every evening.   aspirin EC 81 MG tablet Take 81 mg by mouth at bedtime.   carvedilol 6.25 MG tablet Commonly known as: COREG Take 1 tablet by mouth twice daily   Eliquis 5 MG Tabs tablet Generic drug: apixaban Take 1 tablet by mouth twice daily   furosemide 40 MG tablet Commonly known as: LASIX Take 40mg  by mouth in the morning and 20mg  in the afternoon.   gabapentin 300 MG capsule Commonly known as: NEURONTIN Take 300 mg by mouth at bedtime.   glipiZIDE 10 MG 24 hr tablet Commonly known as: GLUCOTROL XL Take 10 mg by mouth at bedtime.   losartan 25 MG tablet Commonly known as: COZAAR Take 1 tablet (25 mg total) by mouth daily.   metFORMIN 500 MG 24 hr tablet Commonly known as: GLUCOPHAGE-XR Take 1,000 mg by mouth 2 (two) times daily.   OneTouch Verio test strip Generic drug: glucose blood 1 each daily.   rosuvastatin 20 MG tablet Commonly known as: CRESTOR Take 10 mg by mouth every other day.   rosuvastatin 5 MG tablet Commonly known as: CRESTOR Take 5 mg by mouth daily.   Vitamin D 50 MCG (2000 UT) tablet Take 2,000 Units by mouth at bedtime.       Allergies:  Allergies  Allergen Reactions  . Entresto [Sacubitril-Valsartan] Other (See Comments)    dizziness    Family History: Family History  Problem Relation Age of Onset  . Anesthesia problems Daughter   . Cancer Mother   . Lung disease Father        & heart disease  . Colon cancer Sister   . Sudden death Maternal Grandmother     Social History:  reports that he quit smoking about 8 years ago. His smoking use included cigars. He quit after 5.00 years of use. He quit smokeless tobacco  use about 8 years ago. He reports current alcohol use. He reports that he does not use drugs.  ROS: All other review of systems were reviewed and are negative except what is noted above in HPI  Physical Exam: BP 103/67   Pulse 72   Temp 98.1 F (36.7 C)   Ht 6\' 2"  (1.88 m)   Wt 235 lb (106.6 kg)   BMI 30.17 kg/m   Constitutional:  Alert and oriented, No acute distress. HEENT: [place] AT, moist mucus membranes.  Trachea midline, no masses. Cardiovascular: No clubbing, cyanosis, or edema. Respiratory: Normal respiratory effort, no increased work of breathing. GI: Abdomen is soft, nontender, nondistended, no abdominal masses GU: No CVA tenderness.  Lymph: No cervical or inguinal lymphadenopathy. Skin: No rashes, bruises or suspicious lesions. Neurologic: Grossly intact, no focal deficits, moving all 4 extremities.  Psychiatric: Normal mood and affect.  Laboratory Data: Lab Results  Component Value Date   WBC 7.8 05/12/2019   HGB 13.3 05/16/2019   HCT 39.0 05/16/2019   MCV 84 05/12/2019   PLT 272 05/12/2019    Lab Results  Component Value Date   CREATININE 1.05 01/22/2020    No results found for: PSA  No results found for: TESTOSTERONE  Lab Results  Component Value Date   HGBA1C 7.3 (H) 02/20/2013    Urinalysis    Component Value Date/Time   COLORURINE YELLOW 12/19/2017 2355   APPEARANCEUR Clear 05/17/2020 1357   LABSPEC 1.019 12/19/2017 2355   PHURINE 5.0 12/19/2017 2355   GLUCOSEU Negative 05/17/2020 1357   HGBUR NEGATIVE 12/19/2017 2355   BILIRUBINUR Negative 05/17/2020 1357   KETONESUR NEGATIVE 12/19/2017 2355   PROTEINUR Negative 05/17/2020 1357   PROTEINUR NEGATIVE 12/19/2017 2355   UROBILINOGEN 0.2 08/04/2013 1448   NITRITE Negative 05/17/2020 1357   NITRITE NEGATIVE 12/19/2017 2355   LEUKOCYTESUR Negative 05/17/2020 1357    Lab Results  Component Value Date   LABMICR Comment 05/17/2020   BACTERIA RARE 08/04/2013    Pertinent Imaging:  No  results found for this or any previous visit.  No results found for this or any previous visit.  No results found for this or any previous visit.  No results found for this or any previous visit.  No results found for this or any previous visit.  No results found for this or any previous visit.  No results found for this or any previous visit.  No results found for this or any previous visit.   Assessment & Plan:    1. Benign prostatic hyperplasia, unspecified whether lower urinary tract symptoms present -continue uroxatral 10mg  - Urinalysis, Routine w reflex microscopic  2. Weak urinary stream -continue uroxatral 10mg  qhs  3. Erectile dysfunction -We will trial tadalafil 20mg  prn.   No follow-ups on file.  Nicolette Bang, MD  [facility] [place]

## 2020-06-25 ENCOUNTER — Telehealth: Payer: Self-pay | Admitting: Urology

## 2020-06-25 ENCOUNTER — Encounter: Payer: Self-pay | Admitting: Urology

## 2020-06-25 DIAGNOSIS — N5201 Erectile dysfunction due to arterial insufficiency: Secondary | ICD-10-CM | POA: Insufficient documentation

## 2020-06-25 NOTE — Telephone Encounter (Signed)
Pt LM stating his generic cialis was going to be $300 and Dr Ronne Binning told him to call if it was that high.

## 2020-07-09 NOTE — Telephone Encounter (Signed)
Please send it to [facility] and have him use [facility]

## 2020-07-09 NOTE — Telephone Encounter (Signed)
Called both numbers and left message to return call.

## 2020-07-25 NOTE — Telephone Encounter (Signed)
Pt was notified to try [facility] at pharmacy.

## 2020-08-01 DIAGNOSIS — M79671 Pain in right foot: Secondary | ICD-10-CM | POA: Diagnosis not present

## 2020-08-01 DIAGNOSIS — E1140 Type 2 diabetes mellitus with diabetic neuropathy, unspecified: Secondary | ICD-10-CM | POA: Diagnosis not present

## 2020-08-01 DIAGNOSIS — L89892 Pressure ulcer of other site, stage 2: Secondary | ICD-10-CM | POA: Diagnosis not present

## 2020-08-01 DIAGNOSIS — M79672 Pain in left foot: Secondary | ICD-10-CM | POA: Diagnosis not present

## 2020-08-01 DIAGNOSIS — I739 Peripheral vascular disease, unspecified: Secondary | ICD-10-CM | POA: Diagnosis not present

## 2020-08-14 DIAGNOSIS — E782 Mixed hyperlipidemia: Secondary | ICD-10-CM | POA: Diagnosis not present

## 2020-08-14 DIAGNOSIS — E1122 Type 2 diabetes mellitus with diabetic chronic kidney disease: Secondary | ICD-10-CM | POA: Diagnosis not present

## 2020-08-14 DIAGNOSIS — I10 Essential (primary) hypertension: Secondary | ICD-10-CM | POA: Diagnosis not present

## 2020-08-14 DIAGNOSIS — E1165 Type 2 diabetes mellitus with hyperglycemia: Secondary | ICD-10-CM | POA: Diagnosis not present

## 2020-08-14 DIAGNOSIS — E7801 Familial hypercholesterolemia: Secondary | ICD-10-CM | POA: Diagnosis not present

## 2020-08-14 DIAGNOSIS — E1140 Type 2 diabetes mellitus with diabetic neuropathy, unspecified: Secondary | ICD-10-CM | POA: Diagnosis not present

## 2020-08-14 DIAGNOSIS — E7849 Other hyperlipidemia: Secondary | ICD-10-CM | POA: Diagnosis not present

## 2020-08-14 DIAGNOSIS — E7800 Pure hypercholesterolemia, unspecified: Secondary | ICD-10-CM | POA: Diagnosis not present

## 2020-08-14 DIAGNOSIS — N183 Chronic kidney disease, stage 3 unspecified: Secondary | ICD-10-CM | POA: Diagnosis not present

## 2020-08-15 DIAGNOSIS — E1151 Type 2 diabetes mellitus with diabetic peripheral angiopathy without gangrene: Secondary | ICD-10-CM | POA: Diagnosis not present

## 2020-08-15 DIAGNOSIS — M79672 Pain in left foot: Secondary | ICD-10-CM | POA: Diagnosis not present

## 2020-08-15 DIAGNOSIS — E1140 Type 2 diabetes mellitus with diabetic neuropathy, unspecified: Secondary | ICD-10-CM | POA: Diagnosis not present

## 2020-08-15 DIAGNOSIS — L89892 Pressure ulcer of other site, stage 2: Secondary | ICD-10-CM | POA: Diagnosis not present

## 2020-08-19 DIAGNOSIS — E1122 Type 2 diabetes mellitus with diabetic chronic kidney disease: Secondary | ICD-10-CM | POA: Diagnosis not present

## 2020-08-19 DIAGNOSIS — I10 Essential (primary) hypertension: Secondary | ICD-10-CM | POA: Diagnosis not present

## 2020-08-19 DIAGNOSIS — Z6830 Body mass index (BMI) 30.0-30.9, adult: Secondary | ICD-10-CM | POA: Diagnosis not present

## 2020-08-19 DIAGNOSIS — E7849 Other hyperlipidemia: Secondary | ICD-10-CM | POA: Diagnosis not present

## 2020-08-19 DIAGNOSIS — F411 Generalized anxiety disorder: Secondary | ICD-10-CM | POA: Diagnosis not present

## 2020-08-19 DIAGNOSIS — I5033 Acute on chronic diastolic (congestive) heart failure: Secondary | ICD-10-CM | POA: Diagnosis not present

## 2020-08-19 DIAGNOSIS — I255 Ischemic cardiomyopathy: Secondary | ICD-10-CM | POA: Diagnosis not present

## 2020-08-19 DIAGNOSIS — E1165 Type 2 diabetes mellitus with hyperglycemia: Secondary | ICD-10-CM | POA: Diagnosis not present

## 2020-08-28 DIAGNOSIS — L89893 Pressure ulcer of other site, stage 3: Secondary | ICD-10-CM | POA: Diagnosis not present

## 2020-08-28 DIAGNOSIS — M79672 Pain in left foot: Secondary | ICD-10-CM | POA: Diagnosis not present

## 2020-08-28 DIAGNOSIS — E1140 Type 2 diabetes mellitus with diabetic neuropathy, unspecified: Secondary | ICD-10-CM | POA: Diagnosis not present

## 2020-08-28 DIAGNOSIS — M79675 Pain in left toe(s): Secondary | ICD-10-CM | POA: Diagnosis not present

## 2020-09-03 DIAGNOSIS — E1140 Type 2 diabetes mellitus with diabetic neuropathy, unspecified: Secondary | ICD-10-CM | POA: Diagnosis not present

## 2020-09-03 DIAGNOSIS — M79671 Pain in right foot: Secondary | ICD-10-CM | POA: Diagnosis not present

## 2020-09-03 DIAGNOSIS — I739 Peripheral vascular disease, unspecified: Secondary | ICD-10-CM | POA: Diagnosis not present

## 2020-09-03 DIAGNOSIS — L110 Acquired keratosis follicularis: Secondary | ICD-10-CM | POA: Diagnosis not present

## 2020-09-03 DIAGNOSIS — M79672 Pain in left foot: Secondary | ICD-10-CM | POA: Diagnosis not present

## 2020-09-11 DIAGNOSIS — L89893 Pressure ulcer of other site, stage 3: Secondary | ICD-10-CM | POA: Diagnosis not present

## 2020-09-11 DIAGNOSIS — E1140 Type 2 diabetes mellitus with diabetic neuropathy, unspecified: Secondary | ICD-10-CM | POA: Diagnosis not present

## 2020-09-11 DIAGNOSIS — M79672 Pain in left foot: Secondary | ICD-10-CM | POA: Diagnosis not present

## 2020-09-18 ENCOUNTER — Encounter: Payer: Self-pay | Admitting: Urology

## 2020-09-18 ENCOUNTER — Other Ambulatory Visit: Payer: Self-pay

## 2020-09-18 ENCOUNTER — Ambulatory Visit (INDEPENDENT_AMBULATORY_CARE_PROVIDER_SITE_OTHER): Payer: Medicare Other | Admitting: Urology

## 2020-09-18 VITALS — BP 105/66 | HR 73 | Temp 98.3°F

## 2020-09-18 DIAGNOSIS — I255 Ischemic cardiomyopathy: Secondary | ICD-10-CM | POA: Diagnosis not present

## 2020-09-18 DIAGNOSIS — N400 Enlarged prostate without lower urinary tract symptoms: Secondary | ICD-10-CM

## 2020-09-18 DIAGNOSIS — R3912 Poor urinary stream: Secondary | ICD-10-CM | POA: Diagnosis not present

## 2020-09-18 DIAGNOSIS — N5201 Erectile dysfunction due to arterial insufficiency: Secondary | ICD-10-CM | POA: Diagnosis not present

## 2020-09-18 LAB — URINALYSIS, ROUTINE W REFLEX MICROSCOPIC
Bilirubin, UA: NEGATIVE
Glucose, UA: NEGATIVE
Ketones, UA: NEGATIVE
Leukocytes,UA: NEGATIVE
Nitrite, UA: NEGATIVE
Protein,UA: NEGATIVE
RBC, UA: NEGATIVE
Specific Gravity, UA: 1.015 (ref 1.005–1.030)
Urobilinogen, Ur: 2 mg/dL — ABNORMAL HIGH (ref 0.2–1.0)
pH, UA: 5.5 (ref 5.0–7.5)

## 2020-09-18 MED ORDER — CIPROFLOXACIN HCL 500 MG PO TABS
500.0000 mg | ORAL_TABLET | Freq: Once | ORAL | Status: AC
  Administered 2020-09-18: 500 mg via ORAL

## 2020-09-18 NOTE — Patient Instructions (Signed)

## 2020-09-18 NOTE — Progress Notes (Signed)
Urological Symptom Review  Patient is experiencing the following symptoms: Frequent urination Hard to postpone urination Trouble starting stream Erection problems (male only)   Review of Systems  Gastrointestinal (upper)  : Negative for upper GI symptoms  Gastrointestinal (lower) : Negative for lower GI symptoms  Constitutional : Fatigue  Skin: Negative for skin symptoms  Eyes: Negative for eye symptoms  Ear/Nose/Throat : Negative for Ear/Nose/Throat symptoms  Hematologic/Lymphatic: Negative for Hematologic/Lymphatic symptoms  Cardiovascular : Negative for cardiovascular symptoms  Respiratory : Shortness of breath  Endocrine: Negative for endocrine symptoms  Musculoskeletal: Negative for musculoskeletal symptoms  Neurological: Dizziness  Psychologic: Negative for psychiatric symptoms

## 2020-09-18 NOTE — Progress Notes (Signed)
09/18/2020 11:31 AM   [name] [name] 12/24/1945 [id number]  Referring provider: [name] [name], MD [place],  [place] [id number]  Followup BPH and erectile dysfunction  HPI: [name] is a 75yo here for followup for BPH and erectile dysfunction. He is currently on Uroxatral. IPSS 15 with QOL 3. He is dizzy with the uroxatral.  He is unhappy with his urination. He has failed flomax previously. He uses tadalafil 20mg  prn for his erectile dysfunction which works well.    PMH: Past Medical History:  Diagnosis Date  . Arthritis    Knee L - probably  . Atrial fibrillation ([place])   . BPH (benign prostatic hyperplasia)   . CAD (coronary artery disease) 07/06/2011  . CHF (congestive heart failure) ([place])   . Coronary artery disease   . Diabetes mellitus   . Frequency of urination   . Heart failure ([place])   . Heart murmur   . High cholesterol   . History of nuclear stress test 09/2010   dipyridamole; mild perfusion defect due to attenuation with mild superimposed ischemia in apical septal, apical, apical inferior & apical lateral regions; rest LV enlarged in size; prominent gut uptake in infero-apical region; no significant ischemia demonstrated; low risk scan   . HNP (herniated nucleus pulposus), lumbar   . Ischemic cardiomyopathy   . LV dysfunction 07/06/2011  . Nocturia   . Right bundle branch block   . 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 Ellison Hughs  . Sleep apnea    sleep study 10/2010- AHI during total sleep 32.1/hr and during REM 62.3/hr (severe sleep apnea)unable to tolerate c pap  . Spinal stenosis of lumbar region   . Stroke [facility])    [place]     Surgical History: Past Surgical History:  Procedure Laterality Date  . CARDIAC CATHETERIZATION  01/2011   ischemic cardiomyopathy 30-35%, multivessel CAD (Dr. [name])   . CARDIAC CATHETERIZATION  09/13/2015   Procedure: Right/Left Heart  Cath and Coronary/Graft Angiography;  Surgeon: [name], MD;  Location: [facility];  Service: Cardiovascular;;  . CARDIOVERSION N/A 05/16/2019   Procedure: CARDIOVERSION;  Surgeon: [name], MD;  Location: [facility] ENDOSCOPY;  Service: Cardiovascular;  Laterality: N/A;  . Colonscopy    . CORONARY ARTERY BYPASS GRAFT  08/03/2011   Procedure: CORONARY ARTERY BYPASS GRAFTING (CABG);  Surgeon: [name], MD;  Location: [facility];  Service: Open Heart Surgery;  Laterality: N/A;  CABG times three using right saphenous vein and left mammary artery usisng endoscope.  Marland Kitchen LEFT HEART CATHETERIZATION WITH CORONARY/GRAFT ANGIOGRAM N/A 09/20/2013   Procedure: LEFT HEART CATHETERIZATION WITH [name];  Surgeon: [name], MD;  Location: [facility] CATH [facility];  Service: Cardiovascular;  Laterality: N/A;  . Lower Extremity Arterial Doppler  03/16/2013   bilat ABIs demonstated normal values; R runoff - posterior tibial & anterior tibial arteries occluded; L runoff - peroneal & posterior tibial arteries occluded, anterior tibial artery appears occluded  . LUMBAR LAMINECTOMY/DECOMPRESSION MICRODISCECTOMY N/A 09/12/2014   Procedure: LUMBAR DECOMPRESSION L3-L4, L4-L5, MICRODISCECTOMY L4-L5;  Surgeon: [name], MD;  Location: [facility];  Service: Orthopedics;  Laterality: N/A;  . Pilonidal Cyst removed    . TRANSTHORACIC ECHOCARDIOGRAM  11/10/2012   EF 40-45%, mild LVH, mild hypokinesis of anteroseptal myocardium, grade 1 diastolic dysfunction; mild MR & calcifed mitral annulus; LA mild-mod dilated; RA mildly  dilated    Home Medications:  Allergies as of 09/18/2020      Reactions   Entresto [sacubitril-valsartan] Other (See Comments)   dizziness   Sacubitril    Other reaction(s): Dizziness      Medication List       Accurate as of September 18, 2020 11:31 AM. If you have any questions, ask your nurse or doctor.        alfuzosin 10 MG 24 hr tablet Commonly known as: UROXATRAL Take 1  tablet (10 mg total) by mouth daily with breakfast.   Alogliptin Benzoate 12.5 MG Tabs Take 12.5 mg by mouth every evening.   aspirin EC 81 MG tablet Take 81 mg by mouth at bedtime.   carvedilol 6.25 MG tablet Commonly known as: COREG Take 1 tablet by mouth twice daily   Eliquis 5 MG Tabs tablet Generic drug: apixaban Take 1 tablet by mouth twice daily   furosemide 40 MG tablet Commonly known as: LASIX Take 40mg  by mouth in the morning and 20mg  in the afternoon.   gabapentin 300 MG capsule Commonly known as: NEURONTIN Take 300 mg by mouth at bedtime.   glipiZIDE 10 MG 24 hr tablet Commonly known as: GLUCOTROL XL Take 10 mg by mouth at bedtime.   losartan 25 MG tablet Commonly known as: COZAAR Take 1 tablet (25 mg total) by mouth daily.   metFORMIN 500 MG 24 hr tablet Commonly known as: GLUCOPHAGE-XR Take 1,000 mg by mouth 2 (two) times daily.   OneTouch Verio test strip Generic drug: glucose blood 1 each daily.   rosuvastatin 20 MG tablet Commonly known as: CRESTOR Take 10 mg by mouth every other day.   rosuvastatin 5 MG tablet Commonly known as: CRESTOR Take 5 mg by mouth daily.   tadalafil 20 MG tablet Commonly known as: CIALIS Take 1 tablet (20 mg total) by mouth as needed.   Vitamin D 50 MCG (2000 UT) tablet Take 2,000 Units by mouth at bedtime.       Allergies:  Allergies  Allergen Reactions  . Entresto [Sacubitril-Valsartan] Other (See Comments)    dizziness  . Sacubitril     Other reaction(s): Dizziness    Family History: Family History  Problem Relation Age of Onset  . Anesthesia problems Daughter   . Cancer Mother   . Lung disease Father        & heart disease  . Colon cancer Sister   . Sudden death Maternal Grandmother     Social History:  reports that he quit smoking about 9 years ago. His smoking use included cigars. He quit after 5.00 years of use. He quit smokeless tobacco use about 8 years ago. He reports current alcohol  use. He reports that he does not use drugs.  ROS: All other review of systems were reviewed and are negative except what is noted above in HPI  Physical Exam: BP 105/66   Pulse 73   Temp 98.3 F (36.8 C)   Constitutional:  Alert and oriented, No acute distress. HEENT: [place] AT, moist mucus membranes.  Trachea midline, no masses. Cardiovascular: No clubbing, cyanosis, or edema. Respiratory: Normal respiratory effort, no increased work of breathing. GI: Abdomen is soft, nontender, nondistended, no abdominal masses GU: No CVA tenderness.  Lymph: No cervical or inguinal lymphadenopathy. Skin: No rashes, bruises or suspicious lesions. Neurologic: Grossly intact, no focal deficits, moving all 4 extremities. Psychiatric: Normal mood and affect.  Laboratory Data: Lab Results  Component Value Date   WBC  7.8 05/12/2019   HGB 13.3 05/16/2019   HCT 39.0 05/16/2019   MCV 84 05/12/2019   PLT 272 05/12/2019    Lab Results  Component Value Date   CREATININE 1.05 01/22/2020    No results found for: PSA  No results found for: TESTOSTERONE  Lab Results  Component Value Date   HGBA1C 7.3 (H) 02/20/2013    Urinalysis    Component Value Date/Time   COLORURINE YELLOW 12/19/2017 2355   APPEARANCEUR Clear 06/20/2020 1153   LABSPEC 1.019 12/19/2017 2355   PHURINE 5.0 12/19/2017 2355   GLUCOSEU Trace (A) 06/20/2020 1153   HGBUR NEGATIVE 12/19/2017 2355   BILIRUBINUR Negative 06/20/2020 1153   KETONESUR NEGATIVE 12/19/2017 2355   PROTEINUR Negative 06/20/2020 1153   PROTEINUR NEGATIVE 12/19/2017 2355   UROBILINOGEN 0.2 08/04/2013 1448   NITRITE Negative 06/20/2020 1153   NITRITE NEGATIVE 12/19/2017 2355   LEUKOCYTESUR Negative 06/20/2020 1153    Lab Results  Component Value Date   LABMICR Comment 06/20/2020   BACTERIA RARE 08/04/2013    Pertinent Imaging:  No results found for this or any previous visit.  No results found for this or any previous visit.  No results found  for this or any previous visit.  No results found for this or any previous visit.  No results found for this or any previous visit.  No results found for this or any previous visit.  No results found for this or any previous visit.  No results found for this or any previous visit.   Assessment & Plan:    1. Benign prostatic hyperplasia, unspecified whether lower urinary tract symptoms present We discussed the management of his BPH including continued medical therapy, Rezum, Urolift, TURP and simple prostatectomy. After discussing the options the patient has elected to proceed with Urolift. Risks/benefits/alternatives discussed.  2. Weak urinary stream -Patient wishes to proceed with Urolift  3. Erectile dysfunction due to arterial insufficiency -tadalafil 20mg  prn   No follow-ups on file.  Nicolette Bang, MD  [facility] [place]    Cystoscopy Procedure Note  Patient identification was confirmed, informed consent was obtained, and patient was prepped using Betadine solution.  Lidocaine jelly was administered per urethral meatus.     Pre-Procedure: - Inspection reveals a normal caliber ureteral meatus.  Procedure: The flexible cystoscope was introduced without difficulty - No urethral strictures/lesions are present. - Enlarged prostate no median lobe - Normal bladder neck - Bilateral ureteral orifices identified - Bladder mucosa  reveals no ulcers, tumors, or lesions - No bladder stones - No trabeculation  Retroflexion shows no intravesical prostatic protrusion   Post-Procedure: - Patient tolerated the procedure well  Assessment/ Plan: We discussed the management of his BPH including continued medical therapy, Rezum, Urolift, TURP and simple prostatectomy. After discussing the options the patient has elected to proceed with Urolift. Risks/benefits/alternatives discussed.   No follow-ups on file.  Nicolette Bang, MD

## 2020-09-20 ENCOUNTER — Encounter: Payer: Self-pay | Admitting: Internal Medicine

## 2020-09-20 ENCOUNTER — Ambulatory Visit (INDEPENDENT_AMBULATORY_CARE_PROVIDER_SITE_OTHER): Payer: Medicare Other | Admitting: Internal Medicine

## 2020-09-20 ENCOUNTER — Other Ambulatory Visit: Payer: Self-pay

## 2020-09-20 VITALS — BP 116/54 | HR 69 | Ht 74.0 in | Wt 238.0 lb

## 2020-09-20 DIAGNOSIS — I5022 Chronic systolic (congestive) heart failure: Secondary | ICD-10-CM | POA: Diagnosis not present

## 2020-09-20 DIAGNOSIS — I255 Ischemic cardiomyopathy: Secondary | ICD-10-CM | POA: Diagnosis not present

## 2020-09-20 DIAGNOSIS — E785 Hyperlipidemia, unspecified: Secondary | ICD-10-CM | POA: Diagnosis not present

## 2020-09-20 DIAGNOSIS — I10 Essential (primary) hypertension: Secondary | ICD-10-CM | POA: Diagnosis not present

## 2020-09-20 DIAGNOSIS — Z951 Presence of aortocoronary bypass graft: Secondary | ICD-10-CM | POA: Diagnosis not present

## 2020-09-20 MED ORDER — EMPAGLIFLOZIN 10 MG PO TABS: 10.0000 mg | ORAL_TABLET | Freq: Every day | ORAL | 11 refills | Status: DC

## 2020-09-20 NOTE — Patient Instructions (Addendum)
Medication Instructions:  STOP alogliptin  START jardiance 10mg  in the morning DECREASE furosemide (lasix) to 40mg  daily Continue all other current medications  *If you need a refill on your cardiac medications before your next appointment, please call your pharmacy*   Lab Work: BMET - to be done 2 weeks after starting jardiance  FASTING lab work in 3 months - to be done before your next visit -- lipid panel, A1c  If you have labs (blood work) drawn today and your tests are completely normal, you will receive your results only by: Marland Kitchen MyChart Message (if you have MyChart) OR . A paper copy in the mail If you have any lab test that is abnormal or we need to change your treatment, we will call you to review the results.   Testing/Procedures: NONE   Follow-Up: At [facility], you and your health needs are our priority.  As part of our continuing mission to provide you with exceptional heart care, we have created designated Provider Care Teams.  These Care Teams include your primary Cardiologist (physician) and Advanced Practice Providers (APPs -  Physician Assistants and Nurse Practitioners) who all work together to provide you with the care you need, when you need it.  We recommend signing up for the patient portal called "MyChart".  Sign up information is provided on this After Visit Summary.  MyChart is used to connect with patients for Virtual Visits (Telemedicine).  Patients are able to view lab/test results, encounter notes, upcoming appointments, etc.  Non-urgent messages can be sent to your provider as well.   To learn more about what you can do with MyChart, go to [email or web address].    Your next appointment:   3 month(s)  The format for your next appointment:   In Person  Provider:   You may see [name], MD or one of the following Advanced Practice Providers on your designated Care Team:    Almyra Deforest, PA-C  Fabian Sharp, PA-C or   Roby Lofts,  [place]    Other Instructions

## 2020-09-20 NOTE — Progress Notes (Signed)
OFFICE NOTE  Chief Complaint:  Follow-up heart failure  Primary Care Physician: [name] [name], MD  HPI:  [name] [name] is a pleasant 75 year old gentleman previously followed by Dr. Rollene Fare. His past medical history is significant for coronary artery disease. In 2013 he underwent multivessel CABG for an ischemic cardiomyopathy. EF was 20-25% prior to surgery however post surgery his EF had improved up to 45-50%. Recently his EF was 40-45% by echo in May of 2014. There was mild mitral regurgitation, mild to moderately dilated left atrium and mild LVH. [name] [name] has been describing some anxiety. He also reports some pain in his legs however underwent Dopplers in September 2014 which showed preserved ABIs bilaterally a 1.0 on the right and 1.1 on the left. The bilateral peroneal and posterior tibial arteries were occluded. He reports some optimal control of his diabetes. His A1c was 7.2. He is concerned about the cost of taking Tradjenta. He is followed by Dr. Elyse Hsu.   He was recently seen in the emergency room and he can hospital. There he presented with hypotension and was given 1 L normal saline and his symptoms improved. He had been having dizziness as well as some upper back pain into the left shoulder blade with exertion recently. The symptoms are worse particularly when climbing up ladders or going up stairs and are relieved at rest. After that hospitalization it was recommended that he discontinue his ACE inhibitor and beta blocker, and his blood pressure appears improved today up to 110/60.  [name] [name] returns today for followup of his stress test. This is interpreted as nonischemic with an EF of 44%. He reports still feeling very fatigued and extremely short of breath when doing activities. He says that the other day he tried to hit golf balls and felt that he was totally exhausted after just an hour. He reports his blood pressure has improved somewhat off of all medications  however remains low. He has had symptoms of orthostatic hypotension in the past. His symptoms do feel similar to prior to his bypass surgery. He also feels that he was much better after surgery than he is now and that his symptoms have been going on for the past 2 months.  At his last office visit, I recommended that [name] have a repeat cardiac catheterization (08/2013). This demonstrated the following:  Impression:  1. 2 vessel native CAD with occluded LAD in the mid-vessel  2. Patent LIMA-LAD, SVG to PLA and SVG to PDA grafts with TIMI III flow  3. LVEDP = 13 mmHg  4. Random PVC's were noted  Based on these findings, I could not find any new cardiac cause of his symptoms. I suspect that some of his fatigue could be related to labile blood sugars. He says unexplained hypotension but is normotensive off of medications.  Mr. Smalls returns today for followup. He now reports feeling better since he had change in his medications. He is established with Dr. Buddy Duty who adjusted his diabetes medicines and stopped his Invokana. His shortness of breath and fatigue in both improved. He is now been expected some problems with left heel pain. He was found to have some spinal stenosis but has had improvement with inserts in his shoes.  I saw [name] [name] back in the office today. He has successfully underwent back surgery earlier this year which she says initially was much improved, however he says he subsequently developed more pain going down his legs. This sounds like  it's neuropathic pain, but is reluctant to try medication for it. He also significantly fatigue. This could be due to a number of etiologies. 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 Also, he is noted to have a history of obstructive sleep apnea. He was placed on BiPAP, but has not been compliant with that due to difficulty wearing the mask.  He also never had follow-up of his sleep study.  [name] [name] returns today for follow-up. He reports he's had worsening dyspnea and fatigue. He says that he can hardly sleep at night. He is struggling with significant anxiety. 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 He's not been wearing the BiPAP due to significant anxiety and difficulty sleeping. He feels like he gets smothered with his machine. Communication between Dr. Radford Pax and Dr. Quintin Alto led to the starting of Celexa as well as Klonopin to use as needed at night. He says that it does give him about 6 hours of sleep. He has not been using his BiPAP as instructed. He is reportedly had more worsening shortness of breath. He was seen in the ER for this and it was thought this was due [email or web address] with BiPAP. His BNP however is elevated over 300. Today in the office he says that he's had more swelling and more abdominal fullness as well as leg edema.  I had the pleasure seeing [name] [name] back today in the office. He seems to respond nicely to Lasix. I started him on 40 mg daily and while I was out of town my partner reviewed his lab work which included an elevated BNP over 300. He increased his Lasix to 40 mg twice a day. [name] [name] says he's had a significant improvement in his breathing. The BNP now is in the mid 200s. Renal function is stable based on labs today. Unfortunately, his echocardiogram does show a new cardiomyopathy with EF 30-35%. It should be noted that he felt "awful" on blood pressure/heart failure medicines and had taken himself off of ACE inhibitor and beta blocker in the past. The echo however does suggest inferior and anterolateral wall motion abnormalities which are new and could indicate graft dysfunction. I performed his last heart catheterization in 2015 which showed all 3 grafts patent.  [name] [name] returns today for follow-up of his left heart catheterization. I  perform this a few weeks ago and he had patent bypass grafts however LV function is reduced. Cardiac output is also reduced. Filling pressures were mildly elevated. He reports since discharge that he's had some improvement of his symptoms. He was started on low-dose beta blocker but he does report fatigue. When I asked him how they compared to previously he said he thinks attack sleep better since he started on the beta blocker but not back to what he thinks his baseline should be. He continues to have problems with left leg pain. He had ultrasound of the left leg which were unrevealing. He also had nerve conduction studies which I sent him for which were not suggestive of neuropathy. His symptoms were worse while lying flat on the Cath Lab table and I suspect this could be again coming from his back and he may need repeat orthopedic evaluation. His primary care provider started him on Celexa recently as well for anxiety.  12/23/2015  Mr. Mittelman was seen back in the  office today in follow-up. Overall he seems to be doing well. He's tolerating low-dose carvedilol and has had some very infrequent morning dizziness. He was previously on Lasix 40 mg twice a day but ran out of the medicine about 8 days ago. He's not had any worsening weight gain or swelling nor worsening shortness of breath over the past week. This could be that he's had some improvement in his cardiomyopathy. Nevertheless, I would like him to be on some Lasix at least until we can determine whether or not he is euvolemic with lab work.  05/27/2016  Mr. Searls returns today for follow-up. He's gained about 6 pounds of weight since I last saw him. He denies any worsening shortness of breath or orthostatic dizziness. He saw Dr. Radford Pax in August for following of his obstructive sleep apnea. He remains physically active and denies any chest pain. He recently had a repeat echo in October 2017 which showed a small improvement in LV function with EF up  to 35-40%. Heart failure regimen includes aspirin, carvedilol, Crestor, and spironolactone. He is not currently on a ARB is he's had orthostatic symptoms in the past although that his generally resolved. His creatinine is normal.  08/24/2016  I saw Mr. Bertino today in follow-up. He again gained about 4 pounds over the past month. He felt that the weight gain was heart failure related because he got more short of breath particularly walking up stairs. Based on that he increased his Lasix back to 40 mg daily. He notes that his urine is been darker and his urination is not necessarily improved. He's also been more dizzy. He says he gets somewhat presyncopal with change in position. Lab work 12 days ago indicated a stable creatinine however. Despite this, I feel that he may be somewhat presyncopal perhaps on too high of a dose of diuretics. He is also on Entresto 24/26 and Aldactone 12.5 mg daily. He also complained of some pain across the back between the shoulder blades. He said this got better with resting and drinking water. It's difficult to say whether that it was the water or perhaps resting from his activities. I cannot exclude that this could be angina. His EKG however is reassuring today.  09/21/2016  Mr. Kelemen was seen today in follow-up. I decreased his Lasix, however he does not feel any change in his dizziness. There is no evidence of presyncope. He has gained about 4 pounds which I suspect is some fluid retention. Blood pressure is well-controlled today 118/56.  02/26/2017  Mr. Ponder returns today for follow-up. He had more recent dizziness and it seemed to worsen on Entresto. He discontinued that and feels that he's now much better. Overall he says he feels well. He is not on ACE inhibitor or ARB but remains on carvedilol. Is also on Lasix 40 mg daily. Blood pressure is stable 122/52.  08/10/2017  Mr. Strauch returns for follow-up.  Overall he seems to be feeling very well.  Denies any chest  pain or worsening shortness of breath.  He gets occasional dizziness but that is much improved.  Of note an EKG was performed again today and there is a suggestion that there may be underlying atrial flutter.  Heart rate was 74 and EKG looks very similar to his prior EKG.  The computer has interpreted a sinus rhythm with first-degree AV block and bifascicular block.  I compared EKG to his previous EKG last August which looks similar however it is distinctly different from the  EKG last of February.  He is asymptomatic, but not anticoagulated.  Otherwise, labs reviewed from October indicate total cholesterol 134, HDL 36 LDL 67 and triglycerides 157.  Hemoglobin A1c of 7.6 and serum creatinine 0.9.  08/30/2017  Mr. Ausborn returns today for follow-up of his studies.  He underwent an echocardiogram to evaluate for change in LVEF, but more importantly to rule out atrial flutter.  His EKG was abnormal and suggested possible atrial flutter, however he was asymptomatic.  The echo did not show any evidence of atrial flutter.  LVEF was stable at 40%.  He also underwent a home 48-hour Holter monitor.  This demonstrated sinus rhythm with PACs and PVCs, but no evidence of atrial fibrillation or flutter.  Remains asymptomatic.  His only other complaint today is chronic leg pain.  He was seen by Dr. Gwenlyn Found and evaluated with lower extremity arterial Dopplers.  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  A referral to neurosurgery was suggested but not placed.  01/24/2018  Mr. Burrous returns today for follow-up.  Overall he says he is doing really well.  He underwent 2 back injections and has had improvement in his back pain and leg pain.  He had an episode in June where he became somewhat dehydrated.  He presented to the ER with orthostatic hypotension.  His Lasix was cut back to 20 mg daily.  Weight is stayed stable effect is been improved from 252-246.  He says he is actually had  enough energy to play golf recently.  08/01/2018  Mr. Wery seen today in routine follow-up.  He has some occasional dizziness which is persistent.  He says he gets a little lightheaded when he urinates.  He has been off of tamsulosin with no specific benefit and now is taking the medication every third day.  Blood pressure is stable.  Weight is stable.  He denies any chest pain or worsening shortness of breath with exertion.  His last echo showed an EF of 40% which is stable if not slightly improved from an echo in 2017.  Unfortunately he could not tolerate Entresto and remains on carvedilol.  He endorses NYHA class I-II heart failure symptoms.  Recent lab work showed total cholesterol 151, HDL 39, LDL 79 triglycerides 166.  Hemoglobin A1c of 7.3.  03/30/2019  Mr. Blazejewski seen today in follow-up.  Overall he is doing well and has no complaints.  He has not recently struggled with any of the dizziness he has had previously.  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  In the past he has had EKG changes that were questionable for possible atrial flutter however monitoring as well as an echocardiogram failed to show any evidence of atrial flutter or fibrillation.  This is now clear finding of atrial fibrillation on EKG with an irregularly irregular rhythm with no evidence of P waves.  He reports being asymptomatic with this.  05/12/2019   Mr. Hutcherson returns today for follow-up of his A. fib.  He reports he has been compliant with Eliquis.  He says he had a couple episodes of loose stools.  He thought it might be due to the medicine but this seems quite atypical.  He stopped some of his stool softener/bulking agents.  Weight is down about 3 pounds.  He increased his Lasix because he felt like it was a little wheezy.  Possibly could have had some more heart failure because he  is in A. fib and does have a history of heart failure.  EKG again shows atrial  flutter/fibrillation with variable AV response at 74.  Since this is presumably persistent at this point we discussed an elective cardioversion and he is agreeable to this.  06/13/2019  Mr. Draheim was seen today in follow-up.  He underwent cardioversion by myself in November.  Since then he has felt fairly well.  His EKG today shows that he is maintaining sinus rhythm.  Blood pressure was 119/63 however he has had some positional dizziness at home.  His daughter notes he has had some blood pressures with low diastolics less than 50 and he does feel some dizziness.  He also feels some fatigue when exerting himself a lot.  I suspect this could be due to hypotension.  Is possible also that his EF has improved now that he is back in sinus and that he may be over diuresed being on both Lasix and Aldactone.  09/15/2019  RandallPurdy returns for follow-up.  He reports improvement in his orthostatic symptoms after stopping Aldactone and his PCP also stopped tamsulosin.  He is now on some saw palmetto for BPH.  Unfortunately he does get a little bit more short of breath and I wonder if this is due to need for more diuretic.  His LVEF was 40% last year however has not been reassessed.  I would like to get an echo to recheck that.  03/25/2020  Mr. Martinson is seen today for 87-month follow-up.  He denies any further orthostatic symptoms.  He was not feeling well on Aldactone.  He was also taken off tamsulosin.  He is only on carvedilol for heart failure and unfortunately his LVEF declined further in March down to 5 to 40%, previously 40 to 45%.  I had increased his Lasix due to signs of increased LV filling pressure however then he became a little bit prerenal.  His Lasix was then decreased to 40 mg every morning and 20 mg every afternoon.  Since then he has been asymptomatic with heart failure at most with NYHA class II symptoms.  Is been struggling with issues with the CPAP and that the humidifier broke.  This is a  [employer] device and he was told by the company that they are no longer manufacturing them rather they are making ventilators because of COVID-19.  Lipid profile in June 2021 showed total cholesterol 168, HDL 32, triglycerides 106 and 116.  A1c was 7.7 and both remain above goal.  09/20/2020  Mr. Almanza returns today for follow-up.  Overall he seems to be doing pretty well.  He struggled with a small wound under his left fifth metatarsal.  He is going to the wound center on April 1.  EF had declined somewhat down to 40 to 45%.  Unfortunately has been intolerant of Entresto and losartan more recently causing worsening fatigue.  His options for heart failure medications have been limited.  He is a diabetic with recent A1c at 6.8.  We discussed today the possibility of adding an SGLT2 inhibitor based on positive data in heart failure and diabetes.  PMHx:  Past Medical History:  Diagnosis Date   Arthritis    Knee L - probably   Atrial fibrillation (HCC)    BPH (benign prostatic hyperplasia)    CAD (coronary artery disease) 07/06/2011   CHF (congestive heart failure) (HCC)    Coronary artery disease    Diabetes mellitus    Frequency of urination  Heart failure (HCC)    Heart murmur    High cholesterol    History of nuclear stress test 09/2010   dipyridamole; mild perfusion defect due to attenuation with mild superimposed ischemia in apical septal, apical, apical inferior & apical lateral regions; rest LV enlarged in size; prominent gut uptake in infero-apical region; no significant ischemia demonstrated; low risk scan    HNP (herniated nucleus pulposus), lumbar    Ischemic cardiomyopathy    LV dysfunction 07/06/2011   Nocturia    Right bundle branch block    S/P CABG (coronary artery bypass graft) 08/03/2011   x3; LIMA to LAD,, SVG to PDA, SVG to posterolateral branch of RCA; Dr. Ellison Hughs   Sleep apnea    sleep study 10/2010- AHI during total sleep 32.1/hr and during REM 62.3/hr  (severe sleep apnea)unable to tolerate c pap   Spinal stenosis of lumbar region    Stroke [facility])    [place]     Past Surgical History:  Procedure Laterality Date   CARDIAC CATHETERIZATION  01/2011   ischemic cardiomyopathy 30-35%, multivessel CAD (Dr. [name])    CARDIAC CATHETERIZATION  09/13/2015   Procedure: Right/Left Heart Cath and Coronary/Graft Angiography;  Surgeon: [name], MD;  Location: [place] CV LAB;  Service: Cardiovascular;;   CARDIOVERSION N/A 05/16/2019   Procedure: CARDIOVERSION;  Surgeon: [name], MD;  Location: [place];  Service: Cardiovascular;  Laterality: N/A;   Colonscopy     CORONARY ARTERY BYPASS GRAFT  08/03/2011   Procedure: CORONARY ARTERY BYPASS GRAFTING (CABG);  Surgeon: [name], MD;  Location: [place];  Service: Open Heart Surgery;  Laterality: N/A;  CABG times three using right saphenous vein and left mammary artery usisng endoscope.   LEFT HEART CATHETERIZATION WITH CORONARY/GRAFT ANGIOGRAM N/A 09/20/2013   Procedure: LEFT HEART CATHETERIZATION WITH [name];  Surgeon: [name], MD;  [facility];  Service: Cardiovascular;  Laterality: N/A;   Lower Extremity Arterial Doppler  03/16/2013   bilat ABIs demonstated normal values; R runoff - posterior tibial & anterior tibial arteries occluded; L runoff - peroneal & posterior tibial arteries occluded, anterior tibial artery appears occluded   LUMBAR LAMINECTOMY/DECOMPRESSION MICRODISCECTOMY N/A 09/12/2014   Procedure: LUMBAR DECOMPRESSION L3-L4, L4-L5, MICRODISCECTOMY L4-L5;  Surgeon: [name], MD;  Location: [facility];  Service: Orthopedics;  Laterality: N/A;   Pilonidal Cyst removed     TRANSTHORACIC ECHOCARDIOGRAM  11/10/2012   EF 40-45%, mild LVH, mild hypokinesis of anteroseptal myocardium, grade 1 diastolic dysfunction; mild MR & calcifed mitral annulus; LA mild-mod dilated; RA mildly dilated    FAMHx:  Family History  Problem  Relation Age of Onset   Anesthesia problems Daughter    Cancer Mother    Lung disease Father        & heart disease   Colon cancer Sister    Sudden death Maternal Grandmother     SOCHx:   reports that he quit smoking about 9 years ago. His smoking use included cigars. He quit after 5.00 years of use. He quit smokeless tobacco use about 8 years ago. He reports current alcohol use. He reports that he does not use drugs.  ALLERGIES:  Allergies  Allergen Reactions   Entresto [Sacubitril-Valsartan] Other (See Comments)    dizziness   Sacubitril     Other reaction(s): Dizziness    ROS: Pertinent items noted in HPI and remainder of comprehensive ROS otherwise negative.  HOME MEDS: Current Outpatient Medications  Medication Sig  Dispense Refill   alfuzosin (UROXATRAL) 10 MG 24 hr tablet Take 1 tablet (10 mg total) by mouth daily with breakfast. 30 tablet 11   Alogliptin Benzoate 12.5 MG TABS Take 12.5 mg by mouth every evening.      aspirin EC 81 MG tablet Take 81 mg by mouth at bedtime.      carvedilol (COREG) 6.25 MG tablet Take 1 tablet by mouth twice daily 180 tablet 3   Cholecalciferol (VITAMIN D) 50 MCG (2000 UT) tablet Take 2,000 Units by mouth at bedtime.     ELIQUIS 5 MG TABS tablet Take 1 tablet by mouth twice daily 180 tablet 1   furosemide (LASIX) 40 MG tablet Take 40mg  by mouth in the morning and 20mg  in the afternoon.     gabapentin (NEURONTIN) 300 MG capsule Take 300 mg by mouth at bedtime.      glipiZIDE (GLUCOTROL XL) 10 MG 24 hr tablet Take 10 mg by mouth at bedtime.     metFORMIN (GLUCOPHAGE-XR) 500 MG 24 hr tablet Take 1,000 mg by mouth 2 (two) times daily.     ONETOUCH VERIO test strip 1 each daily.     rosuvastatin (CRESTOR) 5 MG tablet Take 5 mg by mouth every other day.     tadalafil (CIALIS) 20 MG tablet Take 1 tablet (20 mg total) by mouth as needed. 20 tablet 5   losartan (COZAAR) 25 MG tablet Take 1 tablet (25 mg total) by mouth daily.  90 tablet 3   No current facility-administered medications for this visit.    LABS/IMAGING: Results for orders placed or performed in visit on 09/18/20 (from the past 48 hour(s))  Urinalysis, Routine w reflex microscopic     Status: Abnormal   Collection Time: 09/18/20 11:52 AM  Result Value Ref Range   Specific Gravity, UA 1.015 1.005 - 1.030   pH, UA 5.5 5.0 - 7.5   Color, UA Yellow Yellow   Appearance Ur Clear Clear   Leukocytes,UA Negative Negative   Protein,UA Negative Negative/Trace   Glucose, UA Negative Negative   Ketones, UA Negative Negative   RBC, UA Negative Negative   Bilirubin, UA Negative Negative   Urobilinogen, Ur 2.0 (H) 0.2 - 1.0 mg/dL   Nitrite, UA Negative Negative   Microscopic Examination Comment     Comment: Microscopic follows if indicated.   No results found.  VITALS: BP (!) 116/54 (BP Location: Left Arm, Patient Position: Sitting, Cuff Size: Normal)    Pulse 69    Ht 6\' 2"  (1.88 m)    Wt 238 lb (108 kg)    BMI 30.56 kg/m   EXAM: General appearance: alert and no distress Neck: no carotid bruit, no JVD and thyroid not enlarged, symmetric, no tenderness/mass/nodules Lungs: clear to auscultation bilaterally Heart: irregularly irregular rhythm Abdomen: soft, non-tender; bowel sounds normal; no masses,  no organomegaly Extremities: extremities normal, atraumatic, no cyanosis or edema Pulses: 2+ and symmetric Skin: Skin color, texture, turgor normal. No rashes or lesions Neurologic: Grossly normal Psych: Pleasant  EKG: Sinus rhythm with first-degree AV block at 69, bifascicular block-personally reviewed  ASSESSMENT: 1. Orthostatic hypotension-resolved 2. New onset atrial fibrillation, CHADSVASC score of 5, status post DCCV (04/2019) 3. Chronic systolic congestive heart failure - EF 40% (07/2017) 4. Coronary artery disease status post three-vessel CABG in 2013 (LIMA to LAD, SVG to PDA and SVG to PLA) - patent grafts by cath in  08/2015 5. Hypertension-controlled 6. Dyslipidemia 7. Diabetes type 2 - better controlled 8. Mild  PVD-with normal ABI 9. Hypotension/upper back pain - resolved 10. 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 OSA-back on BiPAP  PLAN: 1.   Mr. Senkbeil has had some improvement in his orthostatic hypotension.  He cannot tolerate losartan or Entresto.  LVEF has declined further at 40 to 45%.  He does have diabetes.  I think he benefit from an SGLT2 inhibitor although his A1c is 6.8.  I advised we stop his alogliptin and switch him to Jardiance 10 mg every morning.  He should continue with lipid Zide at night and Metformin in the morning.  We will plan to repeat A1c in about 3 months.  We will also check a metabolic profile in about 2 weeks.  I encouraged hydration.  He was informed of the possible side effects of SGLT2 inhibitors including urinary tract infections and Fournier's gangrene which is fairly rare.  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  He is followed regularly by a urologist and is considering a UroLift procedure.  He may need to come off of the Eliquis prior to that which would be okay for 2 days prior to the procedure if necessary.  Follow-up with me in 3 months or sooner as necessary.  [name], MD, [facility], [place] Director of the Advanced Lipid Disorders &  Cardiovascular Risk Reduction Clinic Diplomate of the American Board of Clinical Lipidology Attending Cardiologist  Direct Dial: [phone]   Fax: [phone]  Website:  [email or web address]  [name] Johnnae Impastato 09/20/2020, 8:12 AM

## 2020-09-23 ENCOUNTER — Telehealth: Payer: Self-pay

## 2020-09-23 NOTE — Telephone Encounter (Signed)
**Note De-Identified [name] Obfuscation** I started a Jardiance PA through covermymeds. Key: [facility]

## 2020-09-23 NOTE — Telephone Encounter (Addendum)
**Note De-identified Thompson Mckim Obfuscation** -----  **Note De-Identified De Jaworski Obfuscation** Message from Fidel Levy, RN sent at 09/20/2020  8:46 AM EDT ----- Regarding: PA - Jardiance   This patient was started on jardiance 10mg  QAM today   Indications: type 2 DM, chronic systolid HF  I think it will need a PA

## 2020-09-23 NOTE — Progress Notes (Signed)
Surgical clearance letter submitted to PCP for upcoming surgery.

## 2020-09-24 ENCOUNTER — Telehealth: Payer: Self-pay

## 2020-09-24 NOTE — Telephone Encounter (Signed)
Pt was called and notified new letter was sent to cardiologist instead and told we are not sure of time and date for surgery yet.

## 2020-09-24 NOTE — Telephone Encounter (Signed)
Calling to request correct paper work (clearnce form) to go to his heart doctor in [place]  He is also wanting to know:  time of procedure where  Please call pt back at [phone].  Thanks, Helene Kelp

## 2020-09-24 NOTE — Progress Notes (Signed)
Pt called and requested paperwork to Cardiologist. New letter sent.

## 2020-09-26 ENCOUNTER — Telehealth: Payer: Self-pay | Admitting: *Deleted

## 2020-09-26 ENCOUNTER — Other Ambulatory Visit: Payer: Self-pay | Admitting: Internal Medicine

## 2020-09-26 DIAGNOSIS — Z01818 Encounter for other preprocedural examination: Secondary | ICD-10-CM

## 2020-09-26 NOTE — Telephone Encounter (Signed)
[name] has not had recent labs.  Please order CBC so that pharmacy may make recommendations regarding his Eliquis.  Thank you.

## 2020-09-26 NOTE — Telephone Encounter (Addendum)
Patient with diagnosis of atrial fibrillation on Eliquis for anticoagulation.    Procedure: cytoscopy w/ urolift procedure Date of procedure: 10/28/20  CHA2DS2-VASc Score = 6  This indicates a 9.7% annual risk of stroke. The patient's score is based upon: CHF History: Yes HTN History: Yes Diabetes History: Yes Stroke History: No Vascular Disease History: Yes Age Score: 2 Gender Score: 0    CrCl 91.4 Platelet count -  Patient has not had CBC checked since 2020. Requires updated labs before providing clearance.

## 2020-09-26 NOTE — Telephone Encounter (Signed)
Left message for the pt to call the office to schedule lab work for pre op, needs CBC as pt is on Eliquis. I will place the order for pt to have lab work done at [place] where he see's Dr. Debara Pickett.

## 2020-09-26 NOTE — Telephone Encounter (Signed)
   [facility] Pre-operative Risk Assessment      Request for surgical clearance:  1. What type of surgery is being performed? CYSTOSCOPY WITH UROLIFT PROCEDURE PERFORMED  At [facility]  2. When is this surgery scheduled? Oct 28, 2020  3. What type of clearance is required (medical clearance vs. Pharmacy clearance to hold med vs. Both)? BOTH   4. Are there any medications that need to be held prior to surgery and how long?ELIQUIS 2 DAYS BEFORE AND 2 DAYS AFTER SCHEDULE PROCEDURE  5. Practice name and name of physician performing surgery? [facility] ; DR MCKENZIE  6. What is the office phone number?  [phone]    7.   What is the office fax number? [phone]   8.   Anesthesia type (None, local, MAC, general) ?  GENERAL ANESTHESIA   [name] [name] 09/26/2020, 1:29 PM  _________________________________________________________________   (provider comments below)

## 2020-09-27 ENCOUNTER — Encounter (HOSPITAL_BASED_OUTPATIENT_CLINIC_OR_DEPARTMENT_OTHER): Payer: Medicare Other | Attending: Internal Medicine | Admitting: Internal Medicine

## 2020-09-27 ENCOUNTER — Other Ambulatory Visit: Payer: Self-pay

## 2020-09-27 DIAGNOSIS — E1151 Type 2 diabetes mellitus with diabetic peripheral angiopathy without gangrene: Secondary | ICD-10-CM | POA: Insufficient documentation

## 2020-09-27 DIAGNOSIS — I110 Hypertensive heart disease with heart failure: Secondary | ICD-10-CM | POA: Diagnosis not present

## 2020-09-27 DIAGNOSIS — I779 Disorder of arteries and arterioles, unspecified: Secondary | ICD-10-CM | POA: Insufficient documentation

## 2020-09-27 DIAGNOSIS — Z87891 Personal history of nicotine dependence: Secondary | ICD-10-CM | POA: Diagnosis not present

## 2020-09-27 DIAGNOSIS — L03116 Cellulitis of left lower limb: Secondary | ICD-10-CM | POA: Diagnosis not present

## 2020-09-27 DIAGNOSIS — E11621 Type 2 diabetes mellitus with foot ulcer: Secondary | ICD-10-CM | POA: Diagnosis not present

## 2020-09-27 DIAGNOSIS — I255 Ischemic cardiomyopathy: Secondary | ICD-10-CM | POA: Insufficient documentation

## 2020-09-27 DIAGNOSIS — Z8249 Family history of ischemic heart disease and other diseases of the circulatory system: Secondary | ICD-10-CM | POA: Diagnosis not present

## 2020-09-27 DIAGNOSIS — L97522 Non-pressure chronic ulcer of other part of left foot with fat layer exposed: Secondary | ICD-10-CM | POA: Diagnosis not present

## 2020-09-27 DIAGNOSIS — L97528 Non-pressure chronic ulcer of other part of left foot with other specified severity: Secondary | ICD-10-CM | POA: Diagnosis not present

## 2020-09-27 DIAGNOSIS — I509 Heart failure, unspecified: Secondary | ICD-10-CM | POA: Insufficient documentation

## 2020-09-27 DIAGNOSIS — E1142 Type 2 diabetes mellitus with diabetic polyneuropathy: Secondary | ICD-10-CM | POA: Insufficient documentation

## 2020-09-27 NOTE — Progress Notes (Signed)
[name] [name], [name] [name] ([id number]) Visit Report for 09/27/2020 Abuse/Suicide Risk Screen Details Patient Name: Date of Service: [name] [name], [name] [name] 09/27/2020 7:30 A M Medical Record Number: [id number] Patient Account Number: [id number] Date of Birth/Sex: Treating RN: 1945-08-16 (75 y.o. Male) Rhae Hammock Primary Care Suleman Gunning: Consuello Masse Other Clinician: Referring Breiana Stratmann: Treating Melisse Caetano/Extender: Zadie Cleverly in Treatment: 0 Abuse/Suicide Risk Screen Items Answer ABUSE RISK SCREEN: Has anyone close to you tried to hurt or harm you recentlyo No Do you feel uncomfortable with anyone in your familyo No Has anyone forced you do things that you didnt want to doo No Electronic Signature(s) Signed: 09/27/2020 5:01:26 PM By: Rhae Hammock RN Entered By: Rhae Hammock on 09/27/2020 08:07:55 -------------------------------------------------------------------------------- Activities of Daily Living Details Patient Name: Date of Service: [name] [name], [name] [name] 09/27/2020 7:30 A M Medical Record Number: [id number] Patient Account Number: [id number] Date of Birth/Sex: Treating RN: [birth year] (75 y.o. Male) Rhae Hammock Primary Care Namya Voges: Consuello Masse Other Clinician: Referring Tnya Ades: Treating Mykenzi Vanzile/Extender: Zadie Cleverly in Treatment: 0 Activities of Daily Living Items Answer Activities of Daily Living (Please select one for each item) Drive Automobile Completely Able T Medications ake Completely Able Use T elephone Completely Able Care for Appearance Completely Able Use T oilet Completely Able Bath / Shower Completely Able Dress Self Completely Able Feed Self Completely Able Walk Completely Able Get In / Out Bed Completely Able Housework Completely Able Prepare Meals Completely [place] for Self Completely Able Electronic Signature(s) Signed: 09/27/2020 5:01:26 PM By: Rhae Hammock  RN Entered By: Rhae Hammock on 09/27/2020 08:05:59 -------------------------------------------------------------------------------- Education Screening Details Patient Name: Date of Service: [name] [name] 09/27/2020 7:30 A M Medical Record Number: [id number] Patient Account Number: [id number] Date of Birth/Sex: Treating RN: [birth year] (75 y.o. Male) Rhae Hammock Primary Care Beckem Tomberlin: Consuello Masse Other Clinician: Referring Ruie Sendejo: Treating Marcelina Mclaurin/Extender: Zadie Cleverly in Treatment: 0 Primary Learner Assessed: Patient Learning Preferences/Education Level/Primary Language Learning Preference: Explanation, Demonstration, Communication Board Highest Education Level: College or Above Preferred Language: English Cognitive Barrier Language Barrier: No Translator Needed: No Memory Deficit: No Emotional Barrier: No Cultural/Religious Beliefs Affecting Medical Care: No Physical Barrier Impaired Vision: No Impaired Hearing: No Decreased Hand dexterity: No Knowledge/Comprehension Knowledge Level: High Comprehension Level: High Ability to understand written instructions: High Ability to understand verbal instructions: High Motivation Anxiety Level: Calm Cooperation: Cooperative Education Importance: Denies Need Interest in Health Problems: Asks Questions Perception: Coherent Willingness to Engage in Self-Management High Activities: Readiness to Engage in Self-Management High Activities: Electronic Signature(s) Signed: 09/27/2020 5:01:26 PM By: Rhae Hammock RN Entered By: Rhae Hammock on 09/27/2020 08:04:53 -------------------------------------------------------------------------------- Fall Risk Assessment Details Patient Name: Date of Service: [name] [name] 09/27/2020 7:30 A M Medical Record Number: [id number] Patient Account Number: [id number] Date of Birth/Sex: Treating RN: 09-10-1945 (75 y.o. Male) Rhae Hammock Primary Care Dnasia Gauna: Consuello Masse Other Clinician: Referring Clayden Withem: Treating Shalom Ware/Extender: Zadie Cleverly in Treatment: 0 Fall Risk Assessment Items Have you had 2 or more falls in the last 12 monthso 0 No Have you had any fall that resulted in injury in the last 12 monthso 0 No FALLS RISK SCREEN History of falling - immediate or within 3 months 0 No Secondary diagnosis (Do you have 2 or more medical diagnoseso) 0 No Ambulatory aid None/bed rest/wheelchair/nurse 0 No Crutches/cane/walker 0 No Furniture 0 No Intravenous therapy Access/Saline/Heparin Lock 0 No Gait/Transferring Normal/ bed rest/ wheelchair 0 No Weak (short  steps with or without shuffle, stooped but able to lift head while walking, may seek 0 No support from furniture) Impaired (short steps with shuffle, may have difficulty arising from chair, head down, impaired 0 No balance) Mental Status Oriented to own ability 0 No Electronic Signature(s) Signed: 09/27/2020 5:01:26 PM By: Rhae Hammock RN Entered By: Rhae Hammock on 09/27/2020 08:05:09 -------------------------------------------------------------------------------- Foot Assessment Details Patient Name: Date of Service: [name] [name] 09/27/2020 7:30 [place] Record Number: [id number] Patient Account Number: [id number] Date of Birth/Sex: Treating RN: Dec 08, 1945 (75 y.o. Male) Rhae Hammock Primary Care Luzmaria Devaux: Consuello Masse Other Clinician: Referring Arsenio Schnorr: Treating Adrianna Dudas/Extender: Zadie Cleverly in Treatment: 0 Foot Assessment Items Site Locations + = Sensation present, - = Sensation absent, C = Callus, U = Ulcer R = Redness, W = Warmth, M = Maceration, PU = Pre-ulcerative lesion F = Fissure, S = Swelling, D = Dryness Assessment Right: Left: Other Deformity: No No Prior Foot Ulcer: No No Prior Amputation: No No Charcot Joint: No No Ambulatory Status: Ambulatory  Without Help Gait: Steady Electronic Signature(s) Signed: 09/27/2020 5:01:26 PM By: Rhae Hammock RN Entered By: Rhae Hammock on 09/27/2020 08:11:11 -------------------------------------------------------------------------------- Nutrition Risk Screening Details Patient Name: Date of Service: [name] [name] 09/27/2020 7:30 A M Medical Record Number: [id number] Patient Account Number: [id number] Date of Birth/Sex: Treating RN: 1946-01-01 (75 y.o. Male) Rhae Hammock Primary Care Ketsia Linebaugh: Consuello Masse Other Clinician: Referring Niam Nepomuceno: Treating Sharanda Shinault/Extender: Zadie Cleverly in Treatment: 0 Height (in): 74 Weight (lbs): 238 Body Mass Index (BMI): 30.6 Nutrition Risk Screening Items Score Screening NUTRITION RISK SCREEN: I have an illness or condition that made me change the kind and/or amount of food I eat 0 No I eat fewer than two meals per day 0 No I eat few fruits and vegetables, or milk products 0 No I have three or more drinks of beer, liquor or wine almost every day 0 No I have tooth or mouth problems that make it hard for me to eat 0 No I don't always have enough money to buy the food I need 0 No I eat alone most of the time 0 No I take three or more different prescribed or over-the-counter drugs a day 0 No Without wanting to, I have lost or gained 10 pounds in the last six months 0 No I am not always physically able to shop, cook and/or feed myself 0 No Nutrition Protocols Good Risk Protocol 0 No interventions needed Moderate Risk Protocol High Risk Proctocol Risk Level: Good Risk Score: 0 Electronic Signature(s) Signed: 09/27/2020 5:01:26 PM By: Rhae Hammock RN Entered By: Rhae Hammock on 09/27/2020 08:05:15

## 2020-09-27 NOTE — Progress Notes (Addendum)
KOAH, CHISENHALL ([id number]) Visit Report for 09/27/2020 Chief Complaint Document Details Patient Name: Date of Service: [name] [name], [name] [name] 09/27/2020 7:30 A M Medical Record Number: [id number] Patient Account Number: [id number] Date of Birth/Sex: Treating RN: 1945-08-05 (75 y.o. [name] Primary Care Provider: Consuello Masse Other Clinician: Referring Provider: Treating Provider/Extender: Zadie Cleverly in Treatment: 0 Information Obtained from: Patient Chief Complaint 09/27/2020; patient is here for review of the wound over the left fifth metatarsal head Electronic Signature(s) Signed: 09/27/2020 5:01:47 PM By: Linton Ham MD Entered By: Linton Ham on 09/27/2020 09:03:18 -------------------------------------------------------------------------------- Debridement Details Patient Name: Date of Service: [name] [name] 09/27/2020 7:30 A M Medical Record Number: [id number] Patient Account Number: [id number] Date of Birth/Sex: Treating RN: [birth year] (75 y.o. [name] Primary Care Provider: Consuello Masse Other Clinician: Referring Provider: Treating Provider/Extender: Zadie Cleverly in Treatment: 0 Debridement Performed for Assessment: Wound #1 Left,Plantar Metatarsal head fifth Performed By: Physician Ricard Dillon., MD Debridement Type: Debridement Severity of Tissue Pre Debridement: Fat layer exposed Level of Consciousness (Pre-procedure): Awake and Alert Pre-procedure Verification/Time Out Yes - 08:33 Taken: Start Time: 08:34 Pain Control: Lidocaine 4% T opical Solution T Area Debrided (L x W): otal 0.3 (cm) x 0.3 (cm) = 0.09 (cm) Tissue and other material debrided: Non-Viable, Subcutaneous Level: Skin/Subcutaneous Tissue Debridement Description: Excisional Instrument: Curette Bleeding: Moderate Hemostasis Achieved: Silver Nitrate End Time: 08:40 Response to Treatment: Procedure was tolerated well Level of  Consciousness (Post- Awake and Alert procedure): Post Debridement Measurements of Total Wound Length: (cm) 0.3 Width: (cm) 0.3 Depth: (cm) 0.3 Volume: (cm) 0.021 Character of Wound/Ulcer Post Debridement: Stable Severity of Tissue Post Debridement: Fat layer exposed Post Procedure Diagnosis Same as Pre-procedure Electronic Signature(s) Signed: 09/27/2020 5:01:47 PM By: Linton Ham MD Signed: 09/27/2020 5:18:29 PM By: Lorrin Jackson Entered By: Linton Ham on 09/27/2020 09:02:56 -------------------------------------------------------------------------------- HPI Details Patient Name: Date of Service: [name] [name] 09/27/2020 7:30 A M Medical Record Number: [id number] Patient Account Number: [id number] Date of Birth/Sex: Treating RN: [birth year] (75 y.o. [name] Primary Care Provider: Consuello Masse Other Clinician: Referring Provider: Treating Provider/Extender: Zadie Cleverly in Treatment: 0 History of Present Illness HPI Description: ADMISSION 09/27/2020 This is a 75 year old man who lives in [place]. He apparently has had callus over the plantar fifth metatarsal head in the past for which she is followed by podiatry in [place]. They shaved down the callus and this was done in January. He states that he went to [place] in January and became aware of when he was there of an open wound in this area. He followed up with podiatry and has been soaking this twice a day with Epson salts and using Silvadene. Apparently one of the podiatrist told him he may need surgery because of bone protrusion. Past medical history includes an ischemic cardiomyopathy, type 2 diabetes with peripheral neuropathy, BPH, PVD, orthostatic hypotension, atrial fibrillation, hyperlipidemia and hypertension Arterial studies in 2018 showed a noncompressible ABI on the left. It was again noncompressible today at 1.7 although his pulses are easily palpable Electronic  Signature(s) Signed: 09/27/2020 5:01:47 PM By: Linton Ham MD Entered By: Linton Ham on 09/27/2020 09:04:45 -------------------------------------------------------------------------------- Physical Exam Details Patient Name: Date of Service: [name] [name] 09/27/2020 7:30 A M Medical Record Number: [id number] Patient Account Number: [id number] Date of Birth/Sex: Treating RN: [birth year] (75 y.o. [name] Primary Care Provider: Consuello Masse Other Clinician: Referring Provider: Treating Provider/Extender:  Zadie Cleverly in Treatment: 0 Constitutional Sitting or standing Blood Pressure is within target range for patient.. Pulse regular and within target range for patient.Marland Kitchen Respirations regular, non-labored and within target range.. Temperature is normal and within the target range for the patient.Marland Kitchen Appears in no distress. Cardiovascular Both dorsalis pedis and posterior tibial pulses are palpable. Neurological Insensate to the monofilament on the bottom of his foot. Notes Wound exam; small slitlike wound on the plantar fifth metatarsal head however using a skinny this clearly had undermining and depth. I used a #3 curette to remove the callus and thick skin around this and subcutaneous debris to fully define the wound area. This looks clean this does not go to bone there is no evidence of infection Electronic Signature(s) Signed: 09/27/2020 5:01:47 PM By: Linton Ham MD Entered By: Linton Ham on 09/27/2020 09:05:52 -------------------------------------------------------------------------------- Physician Orders Details Patient Name: Date of Service: [name] [name] 09/27/2020 7:30 A M Medical Record Number: [id number] Patient Account Number: [id number] Date of Birth/Sex: Treating RN: June 04, 1946 (75 y.o. [name] Primary Care Provider: Consuello Masse Other Clinician: Referring Provider: Treating Provider/Extender: Zadie Cleverly in Treatment: 0 Verbal / Phone Orders: No Diagnosis Coding Follow-up Appointments Return Appointment in 1 week. Bathing/ Shower/ Hygiene May shower with protection but do not get wound dressing(s) wet. - On days dressing not changed May shower and wash wound with soap and water. - on days changing dressing. Do not soak foot. Edema Control - Lymphedema / SCD / Other Elevate legs to the level of the heart or above for 30 minutes daily and/or when sitting, a frequency of: Avoid standing for long periods of time. Off-Loading Open toe surgical shoe to: - Reduce pressure and friction to area as much as possible. Additional Orders / Instructions Follow Nutritious Diet Wound Treatment Wound #1 - Metatarsal head fifth Wound Laterality: Plantar, Left Cleanser: Soap and Water Every Other Day/15 Days Discharge Instructions: May shower and wash wound with dial antibacterial soap and water prior to dressing change. Prim Dressing: Promogran Prisma Matrix, 4.34 (sq in) (silver collagen) Every Other Day/15 Days ary Discharge Instructions: Moisten collagen with saline or hydrogel Secondary Dressing: Woven Gauze Sponges 2x2 in Every Other Day/15 Days Discharge Instructions: Apply over primary dressing as directed. Secondary Dressing: Optifoam Non-Adhesive Dressing, 4x4 in Every Other Day/15 Days Discharge Instructions: Apply foam donut over primary dressing Secured With: Kerlix Roll Sterile, 4.5x3.1 (in/yd) Every Other Day/15 Days Discharge Instructions: Secure with Kerlix as directed. Secured With: Transpore Surgical Tape, 2x10 (in/yd) Every Other Day/15 Days Discharge Instructions: Secure dressing with tape as directed. Consults Vascular - For non healing wound, Non-compressible ABI's Notes [employer] Electronic Signature(s) Signed: 10/18/2020 5:01:44 PM By: Lorrin Jackson Signed: 10/28/2020 5:26:38 PM By: Linton Ham MD Previous Signature: 09/27/2020 5:01:47 PM  Version By: Linton Ham MD Previous Signature: 09/27/2020 5:18:29 PM Version By: Lorrin Jackson Previous Signature: 09/27/2020 5:18:29 PM Version By: Lorrin Jackson Previous Signature: 09/27/2020 7:55:52 AM Version By: Lorrin Jackson Entered By: Lorrin Jackson on 10/18/2020 09:21:50 -------------------------------------------------------------------------------- Problem List Details Patient Name: Date of Service: [name] [name]. 09/27/2020 7:30 A M Medical Record Number: [id number] Patient Account Number: [id number] Date of Birth/Sex: Treating RN: 1945-07-24 (74 y.o. [name] Primary Care Provider: Consuello Masse Other Clinician: Referring Provider: Treating Provider/Extender: Zadie Cleverly in Treatment: 0 Active Problems ICD-10 Encounter Code Description Active Date MDM Diagnosis E11.621 Type 2 diabetes mellitus with foot ulcer 09/27/2020 No Yes L97.528  Non-pressure chronic ulcer of other part of left foot with other specified 09/27/2020 No Yes severity E11.42 Type 2 diabetes mellitus with diabetic polyneuropathy 09/27/2020 No Yes Inactive Problems Resolved Problems Electronic Signature(s) Signed: 09/27/2020 5:01:47 PM By: Linton Ham MD Entered By: Linton Ham on 09/27/2020 08:58:02 -------------------------------------------------------------------------------- Progress Note Details Patient Name: Date of Service: [name] [name] 09/27/2020 7:30 A M Medical Record Number: [id number] Patient Account Number: [id number] Date of Birth/Sex: Treating RN: [birth year] (74 y.o. [name] Primary Care Provider: Consuello Masse Other Clinician: Referring Provider: Treating Provider/Extender: Zadie Cleverly in Treatment: 0 Subjective Chief Complaint Information obtained from Patient 09/27/2020; patient is here for review of the wound over the left fifth metatarsal head History of Present Illness (HPI) ADMISSION 09/27/2020 This is a  75 year old man who lives in [place]. He apparently has had callus over the plantar fifth metatarsal head in the past for which she is followed by podiatry in [place]. They shaved down the callus and this was done in January. He states that he went to [place] in January and became aware of when he was there of an open wound in this area. He followed up with podiatry and has been soaking this twice a day with Epson salts and using Silvadene. Apparently one of the podiatrist told him he may need surgery because of bone protrusion. Past medical history includes an ischemic cardiomyopathy, type 2 diabetes with peripheral neuropathy, BPH, PVD, orthostatic hypotension, atrial fibrillation, hyperlipidemia and hypertension Arterial studies in 2018 showed a noncompressible ABI on the left. It was again noncompressible today at 1.7 although his pulses are easily palpable Patient History Information obtained from Patient. Allergies No Known Allergies Family History Cancer - Mother, Heart Disease - Father, No family history of Diabetes, Hereditary Spherocytosis, Hypertension, Kidney Disease, Lung Disease, Seizures, Stroke, Thyroid Problems, Tuberculosis. 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 Medical History Eyes Denies history of Cataracts, Glaucoma, Optic Neuritis Ear/Nose/Mouth/Throat Denies history of Chronic sinus problems/congestion, Middle ear problems Hematologic/Lymphatic Denies history of Anemia, Hemophilia, Human Immunodeficiency Virus, Lymphedema, Sickle Cell Disease Respiratory Patient has history of Sleep Apnea - wears bi pap at night Denies history of Aspiration, Asthma, Chronic Obstructive Pulmonary Disease (COPD), Pneumothorax, Tuberculosis Cardiovascular Patient has history of Arrhythmia - A FibB, Congestive Heart Failure, Coronary Artery Disease Denies history of Angina, Deep  Vein Thrombosis, Hypertension, Hypotension, Myocardial Infarction, Peripheral Arterial Disease, Peripheral Venous Disease, Phlebitis, Vasculitis Gastrointestinal Denies history of Cirrhosis , Colitis, Crohnoos, Hepatitis A, Hepatitis B, Hepatitis C Endocrine Patient has history of Type II Diabetes Denies history of Type I Diabetes Genitourinary Denies history of End Stage Renal Disease Immunological Denies history of Lupus Erythematosus, Raynaudoos, Scleroderma Integumentary (Skin) Denies history of History of Burn Musculoskeletal Patient has history of Osteoarthritis Denies history of Gout, Rheumatoid Arthritis, Osteomyelitis Neurologic Patient has history of Neuropathy Denies history of Dementia, Quadriplegia, Paraplegia, Seizure Disorder Patient is treated with Oral Agents. Hospitalization/Surgery History - cardiac cath. - cardioversion. - coronary artery bypass graft. - lumbar laminectomy. Medical A Surgical History Notes nd Cardiovascular ischemic cardiomyopathy, hypercholesterolemia, heart murmur, Genitourinary BPH Neurologic wallenburg stroke, spinal stenosis of lumbar region Review of Systems (ROS) Constitutional Symptoms (General Health) Denies complaints or symptoms of Fatigue, Fever, Chills, Marked Weight Change. Eyes Denies complaints or symptoms of Dry Eyes, Vision Changes, Glasses / Contacts. Ear/Nose/Mouth/Throat Denies complaints or symptoms of Chronic sinus problems or rhinitis. Respiratory  Denies complaints or symptoms of Chronic or frequent coughs, Shortness of Breath. Cardiovascular Denies complaints or symptoms of Chest pain. Gastrointestinal Denies complaints or symptoms of Frequent diarrhea, Nausea, Vomiting. Genitourinary Denies complaints or symptoms of Frequent urination. Integumentary (Skin) Complains or has symptoms of Wounds - 1. Musculoskeletal Denies complaints or symptoms of Muscle Pain, Muscle Weakness. Neurologic Denies complaints  or symptoms of Numbness/parasthesias. Psychiatric Denies complaints or symptoms of Claustrophobia, Suicidal. Objective Constitutional Sitting or standing Blood Pressure is within target range for patient.. Pulse regular and within target range for patient.Marland Kitchen Respirations regular, non-labored and within target range.. Temperature is normal and within the target range for the patient.Marland Kitchen Appears in no distress. Vitals Time Taken: 7:43 AM, Height: 74 in, Source: Stated, Weight: 238 lbs, Source: Stated, BMI: 30.6, Temperature: 98.1 F, Pulse: 61 bpm, Respiratory Rate: 18 breaths/min, Blood Pressure: 105/67 mmHg. Cardiovascular Both dorsalis pedis and posterior tibial pulses are palpable. Neurological Insensate to the monofilament on the bottom of his foot. General Notes: Wound exam; small slitlike wound on the plantar fifth metatarsal head however using a skinny this clearly had undermining and depth. I used a #3 curette to remove the callus and thick skin around this and subcutaneous debris to fully define the wound area. This looks clean this does not go to bone there is no evidence of infection Integumentary (Hair, Skin) Wound #1 status is Open. Original cause of wound was Gradually Appeared. The date acquired was: 06/29/2020. The wound is located on the Left,Plantar Metatarsal head fifth. The wound measures 0.3cm length x 0.3cm width x 0.3cm depth; 0.071cm^2 area and 0.021cm^3 volume. There is no tunneling or undermining noted. There is a medium amount of serosanguineous drainage noted. The wound margin is distinct with the outline attached to the wound base. There is large (67-100%) red, pink granulation within the wound bed. There is no necrotic tissue within the wound bed. Assessment Active Problems ICD-10 Type 2 diabetes mellitus with foot ulcer Non-pressure chronic ulcer of other part of left foot with other specified severity Type 2 diabetes mellitus with diabetic  polyneuropathy Procedures Wound #1 Pre-procedure diagnosis of Wound #1 is a Diabetic Wound/Ulcer of the Lower Extremity located on the Left,Plantar Metatarsal head fifth .Severity of Tissue Pre Debridement is: Fat layer exposed. There was a Excisional Skin/Subcutaneous Tissue Debridement with a total area of 0.09 sq cm performed by Ricard Dillon., MD. With the following instrument(s): Curette to remove Non-Viable tissue/material. Material removed includes Subcutaneous Tissue after achieving pain control using Lidocaine 4% Topical Solution. No specimens were taken. A time out was conducted at 08:33, prior to the start of the procedure. A Moderate amount of bleeding was controlled with Silver Nitrate. The procedure was tolerated well. Post Debridement Measurements: 0.3cm length x 0.3cm width x 0.3cm depth; 0.021cm^3 volume. Character of Wound/Ulcer Post Debridement is stable. Severity of Tissue Post Debridement is: Fat layer exposed. Post procedure Diagnosis Wound #1: Same as Pre-Procedure Plan Follow-up Appointments: Return Appointment in 1 week. Bathing/ Shower/ Hygiene: May shower with protection but do not get wound dressing(s) wet. - On no dressing change days May shower and wash wound with soap and water. - on days changing dressing. Do not soak foot. Edema Control - Lymphedema / SCD / Other: Elevate legs to the level of the heart or above for 30 minutes daily and/or when sitting, a frequency of: Avoid standing for long periods of time. Off-Loading: Open toe surgical shoe to: - Reduce pressure and friction to area as much as  possible. Additional Orders / Instructions: Follow Nutritious Diet General Notes: [employer] WOUND #1: - Metatarsal head fifth Wound Laterality: Plantar, Left Cleanser: Soap and Water Every Other Day/15 Days Discharge Instructions: May shower and wash wound with dial antibacterial soap and water prior to dressing change. Prim Dressing: Promogran Prisma  Matrix, 4.34 (sq in) (silver collagen) Every Other Day/15 Days ary Discharge Instructions: Moisten collagen with saline or hydrogel Secondary Dressing: Woven Gauze Sponges 2x2 in Every Other Day/15 Days Discharge Instructions: Apply over primary dressing as directed. Secondary Dressing: Optifoam Non-Adhesive Dressing, 4x4 in Every Other Day/15 Days Discharge Instructions: Apply foam donut over primary dressing Secured With: Kerlix Roll Sterile, 4.5x3.1 (in/yd) Every Other Day/15 Days Discharge Instructions: Secure with Kerlix as directed. Secured With: Transpore Surgical T ape, 2x10 (in/yd) Every Other Day/15 Days Discharge Instructions: Secure dressing with tape as directed. 1. Predominantly neuropathic diabetic foot ulcer 2. I agree with one of his podiatrist there does appear to be subluxation of his fifth metatarsal head yet he has no wound history. At this point unless he fails conservative treatment and/or has recurrent wounds I think surgery would be a bit premature 3. We are going to use silver collagen, foam, gauze kerlix. 4. He tells me his balance is a bit insecure even when he was switched to the healing sandal he has now I am a bit reluctant to go ahead with a forefoot off loader and/or a total contact cast although I did discuss both of those with him. 5. I have asked him not to soak this wound especially not in Epson salts at this point but washing this on every second day and soap and water is fine absolutely no walking out of his prescribed foot wear. I continued with the surgical shoe with padding that was provided but I wonder whether he walks on the lateral part of his foot I spent 35 minutes in reviewing this patient's past medical history, face-to-face evaluation and preparation of this record Electronic Signature(s) Signed: 09/27/2020 5:01:47 PM By: Linton Ham MD Entered By: Linton Ham on 09/27/2020  94:76:54 -------------------------------------------------------------------------------- HxROS Details Patient Name: Date of Service: [name] [name] 09/27/2020 7:30 A M Medical Record Number: [id number] Patient Account Number: [id number] Date of Birth/Sex: Treating RN: Jul 31, 1945 (75 y.o. [name] Primary Care Provider: Consuello Masse Other Clinician: Referring Provider: Treating Provider/Extender: Zadie Cleverly in Treatment: 0 Information Obtained From Patient Constitutional Symptoms ([place]) Complaints and Symptoms: Negative for: Fatigue; Fever; Chills; Marked Weight Change Eyes Complaints and Symptoms: Negative for: Dry Eyes; Vision Changes; Glasses / Contacts Medical History: Negative for: Cataracts; Glaucoma; Optic Neuritis Ear/Nose/Mouth/Throat Complaints and Symptoms: Negative for: Chronic sinus problems or rhinitis Medical History: Negative for: Chronic sinus problems/congestion; Middle ear problems Respiratory Complaints and Symptoms: Negative for: Chronic or frequent coughs; Shortness of Breath Medical History: Positive for: Sleep Apnea - wears bi pap at night Negative for: Aspiration; Asthma; Chronic Obstructive Pulmonary Disease (COPD); Pneumothorax; Tuberculosis Cardiovascular Complaints and Symptoms: Negative for: Chest pain Medical History: Positive for: Arrhythmia - A FibB; Congestive Heart Failure; Coronary Artery Disease Negative for: Angina; Deep Vein Thrombosis; Hypertension; Hypotension; Myocardial Infarction; Peripheral Arterial Disease; Peripheral Venous Disease; Phlebitis; Vasculitis Past Medical History Notes: ischemic cardiomyopathy, hypercholesterolemia, heart murmur, Gastrointestinal Complaints and Symptoms: Negative for: Frequent diarrhea; Nausea; Vomiting Medical History: Negative for: Cirrhosis ; Colitis; Crohns; Hepatitis A; Hepatitis B; Hepatitis C Genitourinary Complaints and  Symptoms: Negative for: Frequent urination Medical History: Negative for: End Stage Renal Disease Past Medical  History Notes: BPH Integumentary (Skin) Complaints and Symptoms: Positive for: Wounds - 1 Medical History: Negative for: History of Burn Musculoskeletal Complaints and Symptoms: Negative for: Muscle Pain; Muscle Weakness Medical History: Positive for: Osteoarthritis Negative for: Gout; Rheumatoid Arthritis; Osteomyelitis Neurologic Complaints and Symptoms: Negative for: Numbness/parasthesias Medical History: Positive for: Neuropathy Negative for: Dementia; Quadriplegia; Paraplegia; Seizure Disorder Past Medical History Notes: wallenburg stroke, spinal stenosis of lumbar region Psychiatric Complaints and Symptoms: Negative for: Claustrophobia; Suicidal Hematologic/Lymphatic Medical History: Negative for: Anemia; Hemophilia; Human Immunodeficiency Virus; Lymphedema; Sickle Cell Disease Endocrine Medical History: Positive for: Type II Diabetes Negative for: Type I Diabetes Time with diabetes: 25 years Treated with: Oral agents Immunological Medical History: Negative for: Lupus Erythematosus; Raynauds; Scleroderma Oncologic Immunizations Pneumococcal Vaccine: Received Pneumococcal Vaccination: Yes Implantable Devices None Hospitalization / Surgery History Type of Hospitalization/Surgery cardiac cath cardioversion coronary artery bypass graft lumbar laminectomy Family and Social History Cancer: Yes - Mother; Diabetes: No; Heart Disease: Yes - Father; Hereditary Spherocytosis: No; Hypertension: No; Kidney Disease: No; Lung Disease: No; Seizures: No; Stroke: No; Thyroid Problems: No; Tuberculosis: No; 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; Marital Status - Married; Alcohol Use: Rarely; Drug Use: No History; Caffeine Use: Rarely; Financial Concerns: No; Food, Clothing or Shelter Needs: No; Support System Lacking: No; Transportation Concerns: No Electronic  Signature(s) Signed: 09/27/2020 5:01:26 PM By: Rhae Hammock RN Signed: 09/27/2020 5:01:47 PM By: Linton Ham MD Entered By: Rhae Hammock on 09/27/2020 08:17:36 -------------------------------------------------------------------------------- SuperBill Details Patient Name: Date of Service: [name] [name] 09/27/2020 Medical Record Number: [id number] Patient Account Number: [id number] Date of Birth/Sex: Treating RN: [birth year] (74 y.o. [name] Primary Care Provider: Consuello Masse Other Clinician: Referring Provider: Treating Provider/Extender: Zadie Cleverly in Treatment: 0 Diagnosis Coding ICD-10 Codes Code Description [phone] Type 2 diabetes mellitus with foot ulcer L97.528 Non-pressure chronic ulcer of other part of left foot with other specified severity E11.42 Type 2 diabetes mellitus with diabetic polyneuropathy Facility Procedures CPT4 Code: [id number] Description: 99213 - WOUND CARE VISIT-LEV 3 EST PT Modifier: 25 Quantity: 1 CPT4 Code: [id number] Description: 02542 - DEB SUBQ TISSUE 20 SQ CM/< ICD-10 Diagnosis Description L97.528 Non-pressure chronic ulcer of other part of left foot with other specified severi E11.621 Type 2 diabetes mellitus with foot ulcer Modifier: ty Quantity: 1 Physician Procedures : CPT4 Code Description Modifier [id number] [place] PHYS LEVEL 3 NEW PT 25 ICD-10 Diagnosis Description 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

## 2020-09-27 NOTE — Progress Notes (Addendum)
[name] [name], [name] [name] ([id number]) Visit Report for 09/27/2020 Allergy List Details Patient Name: Date of Service: [name] [name], [name] [name] 09/27/2020 7:30 A M Medical Record Number: [id number] Patient Account Number: [id number] Date of Birth/Sex: Treating RN: 1946-04-03 (75 y.o. Male) Rhae Hammock Primary Care Alaiza Yau: Consuello Masse Other Clinician: Referring Han Lysne: Treating Jaqwon Manfred/Extender: Zadie Cleverly in Treatment: 0 Allergies Active Allergies No Known Allergies Allergy Notes Electronic Signature(s) Signed: 09/27/2020 5:01:26 PM By: Rhae Hammock RN Entered By: Rhae Hammock on 09/27/2020 08:04:08 -------------------------------------------------------------------------------- Arrival Information Details Patient Name: Date of Service: [name] [name] 09/27/2020 7:30 A M Medical Record Number: [id number] Patient Account Number: [id number] Date of Birth/Sex: Treating RN: August 18, 1945 (75 y.o. Male) Lorrin Jackson Primary Care Gael Delude: Consuello Masse Other Clinician: Referring Emelyn Roen: Treating Shamel Galyean/Extender: Zadie Cleverly in Treatment: 0 Visit Information Patient Arrived: Ambulatory Arrival Time: 07:43 Accompanied By: Daughter Transfer Assistance: None Patient Identification Verified: Yes Secondary Verification Process Completed: Yes Electronic Signature(s) Signed: 09/27/2020 7:49:02 AM By: Sandre Kitty Entered By: Sandre Kitty on 09/27/2020 07:43:48 -------------------------------------------------------------------------------- Clinic Level of Care Assessment Details Patient Name: Date of Service: [name] [name], [name] [name] 09/27/2020 7:30 A M Medical Record Number: [id number] Patient Account Number: [id number] Date of Birth/Sex: Treating RN: 1945-07-31 (75 y.o. Male) Lorrin Jackson Primary Care Westin Knotts: Consuello Masse Other Clinician: Referring Garrett Bowring: Treating Chaslyn Eisen/Extender: Zadie Cleverly in  Treatment: 0 Clinic Level of Care Assessment Items TOOL 1 Quantity Score X- 1 0 Use when EandM and Procedure is performed on INITIAL visit ASSESSMENTS - Nursing Assessment / Reassessment X- 1 20 General Physical Exam (combine w/ comprehensive assessment (listed just below) when performed on new pt. evals) X- 1 25 Comprehensive Assessment (HX, ROS, Risk Assessments, Wounds Hx, etc.) ASSESSMENTS - Wound and Skin Assessment / Reassessment []  - 0 Dermatologic / Skin Assessment (not related to wound area) ASSESSMENTS - Ostomy and/or Continence Assessment and Care []  - 0 Incontinence Assessment and Management []  - 0 Ostomy Care Assessment and Management (repouching, etc.) PROCESS - Coordination of Care []  - 0 Simple Patient / Family Education for ongoing care X- 1 20 Complex (extensive) Patient / Family Education for ongoing care X- 1 10 Staff obtains Programmer, systems, Records, T Results / Process Orders est []  - 0 Staff telephones HHA, Nursing Homes / Clarify orders / etc []  - 0 Routine Transfer to another Facility (non-emergent condition) []  - 0 Routine Hospital Admission (non-emergent condition) []  - 0 New Admissions / Biomedical engineer / Ordering NPWT Apligraf, etc. , []  - 0 Emergency Hospital Admission (emergent condition) PROCESS - Special Needs []  - 0 Pediatric / Minor Patient Management []  - 0 Isolation Patient Management []  - 0 Hearing / Language / Visual special needs []  - 0 Assessment of Community assistance (transportation, D/C planning, etc.) []  - 0 Additional assistance / Altered mentation []  - 0 Support Surface(s) Assessment (bed, cushion, seat, etc.) INTERVENTIONS - Miscellaneous []  - 0 External ear exam []  - 0 Patient Transfer (multiple staff / Civil Service fast streamer / Similar devices) []  - 0 Simple Staple / Suture removal (25 or less) []  - 0 Complex Staple / Suture removal (26 or more) []  - 0 Hypo/Hyperglycemic Management (do not check if billed  separately) X- 1 15 Ankle / Brachial Index (ABI) - do not check if billed separately Has the patient been seen at the hospital within the last three years: Yes Total Score: 90 Level Of Care: New/Established - Level 3 Electronic Signature(s) Signed: 09/27/2020 5:18:29 PM By: Lorrin Jackson  Entered By: Lorrin Jackson on 09/27/2020 08:42:43 -------------------------------------------------------------------------------- Lower Extremity Assessment Details Patient Name: Date of Service: [name] [name], [name] [name] 09/27/2020 7:30 A M Medical Record Number: [id number] Patient Account Number: [id number] Date of Birth/Sex: Treating RN: May 11, 1946 (75 y.o. Male) Rhae Hammock Primary Care Lelia Jons: Consuello Masse Other Clinician: Referring Sebrena Engh: Treating Edlin Ford/Extender: Zadie Cleverly in Treatment: 0 Edema Assessment Assessed: Shirlyn Goltz: Yes] Patrice Paradise: No] Edema: [Left: Ye] [Right: s] Calf Left: Right: Point of Measurement: 40 cm From Medial Instep 37 cm Ankle Left: Right: Point of Measurement: 10 cm From Medial Instep 29 cm Vascular Assessment Pulses: Dorsalis Pedis Palpable: [Left:Yes] Posterior Tibial Palpable: [Left:Yes] Electronic Signature(s) Signed: 09/27/2020 5:01:26 PM By: Rhae Hammock RN Entered By: Rhae Hammock on 09/27/2020 08:12:19 -------------------------------------------------------------------------------- Multi Wound Chart Details Patient Name: Date of Service: [name] [name] 09/27/2020 7:30 A M Medical Record Number: [id number] Patient Account Number: [id number] Date of Birth/Sex: Treating RN: August 22, 1945 (75 y.o. Male) Lorrin Jackson Primary Care Alexsandria Kivett: Consuello Masse Other Clinician: Referring Rudi Bunyard: Treating Colbie Sliker/Extender: Zadie Cleverly in Treatment: 0 Vital Signs Height(in): 74 Pulse(bpm): 61 Weight(lbs): 238 Blood Pressure(mmHg): 105/67 Body Mass Index(BMI): 31 Temperature(F):  98.1 Respiratory Rate(breaths/min): 18 Photos: [1:No Photos Left, Plantar Metatarsal head fifth] [N/A:N/A N/A] Wound Location: [1:Gradually Appeared] [N/A:N/A] Wounding Event: [1:Diabetic Wound/Ulcer of the Lower] [N/A:N/A] Primary Etiology: [1:Extremity Sleep Apnea, Arrhythmia, Congestive] [N/A:N/A] Comorbid History: [1:Heart Failure, Coronary Artery Disease, Type II Diabetes, Osteoarthritis, Neuropathy 06/29/2020] [N/A:N/A] Date Acquired: [1:0] [N/A:N/A] Weeks of Treatment: [1:Open] [N/A:N/A] Wound Status: [1:0.3x0.3x0.3] [N/A:N/A] Measurements L x W x D (cm) [1:0.071] [N/A:N/A] A (cm) : rea [1:0.021] [N/A:N/A] Volume (cm) : [1:Grade 2] [N/A:N/A] Classification: [1:Medium] [N/A:N/A] Exudate A mount: [1:Serosanguineous] [N/A:N/A] Exudate Type: [1:red, brown] [N/A:N/A] Exudate Color: [1:Distinct, outline attached] [N/A:N/A] Wound Margin: [1:Large (67-100%)] [N/A:N/A] Granulation A mount: [1:Red, Pink] [N/A:N/A] Granulation Quality: [1:None Present (0%)] [N/A:N/A] Necrotic A mount: [1:Fascia: No] [N/A:N/A] Exposed Structures: [1:Fat Layer (Subcutaneous Tissue): No Tendon: No Muscle: No Joint: No Bone: No None] [N/A:N/A] Epithelialization: [1:Debridement - Excisional] [N/A:N/A] Debridement: Pre-procedure Verification/Time Out 08:33 [N/A:N/A] Taken: [1:Lidocaine 4% T opical Solution] [N/A:N/A] Pain Control: [1:Subcutaneous] [N/A:N/A] Tissue Debrided: [1:Skin/Subcutaneous Tissue] [N/A:N/A] Level: [1:0.09] [N/A:N/A] Debridement A (sq cm): [1:rea Curette] [N/A:N/A] Instrument: [1:Moderate] [N/A:N/A] Bleeding: [1:Silver Nitrate] [N/A:N/A] Hemostasis A chieved: [1:Procedure was tolerated well] [N/A:N/A] Debridement Treatment Response: [1:0.3x0.3x0.3] [N/A:N/A] Post Debridement Measurements L x W x D (cm) [1:0.021] [N/A:N/A] Post Debridement Volume: (cm) [1:Debridement] [N/A:N/A] Treatment Notes Electronic Signature(s) Signed: 09/27/2020 9:03:28 AM By: Lorrin Jackson Entered By:  Lorrin Jackson on 09/27/2020 09:03:27 -------------------------------------------------------------------------------- Multi-Disciplinary Care Plan Details Patient Name: Date of Service: [name] [name] 09/27/2020 7:30 A M Medical Record Number: [id number] Patient Account Number: [id number] Date of Birth/Sex: Treating RN: 1945/08/25 (75 y.o. Male) Lorrin Jackson Primary Care Taliesin Hartlage: Consuello Masse Other Clinician: Referring Londyn Wotton: Treating Oleva Koo/Extender: Zadie Cleverly in Treatment: 0 Active Inactive Nutrition Nursing Diagnoses: Impaired glucose control: actual or potential Goals: Patient/caregiver verbalizes understanding of need to maintain therapeutic glucose control per primary care physician Date Initiated: 09/27/2020 Target Resolution Date: 10/25/2020 Goal Status: Active Interventions: Assess HgA1c results as ordered upon admission and as needed Assess patient nutrition upon admission and as needed per policy Provide education on elevated blood sugars and impact on wound healing Provide education on nutrition Notes: Orientation to the Wound Care Program Nursing Diagnoses: Knowledge deficit related to the wound healing center program Goals: Patient/caregiver will verbalize understanding of the [facility] Date Initiated: 09/27/2020 Target Resolution Date:  10/25/2020 Goal Status: Active Interventions: Provide education on orientation to the wound center Notes: Wound/Skin Impairment Nursing Diagnoses: Impaired tissue integrity Goals: Patient/caregiver will verbalize understanding of skin care regimen Date Initiated: 09/27/2020 Target Resolution Date: 10/25/2020 Goal Status: Active Ulcer/skin breakdown will have a volume reduction of 30% by week 4 Date Initiated: 09/27/2020 Target Resolution Date: 10/25/2020 Goal Status: Active Interventions: Assess patient/caregiver ability to obtain necessary supplies Assess patient/caregiver  ability to perform ulcer/skin care regimen upon admission and as needed Assess ulceration(s) every visit Provide education on ulcer and skin care Treatment Activities: Skin care regimen initiated : 09/27/2020 Topical wound management initiated : 09/27/2020 Notes: Electronic Signature(s) Signed: 09/27/2020 5:18:29 PM By: Lorrin Jackson Previous Signature: 09/27/2020 8:31:20 AM Version By: Lorrin Jackson Entered By: Lorrin Jackson on 09/27/2020 08:37:25 -------------------------------------------------------------------------------- Pain Assessment Details Patient Name: Date of Service: [name] [name] 09/27/2020 7:30 A M Medical Record Number: [id number] Patient Account Number: [id number] Date of Birth/Sex: Treating RN: September 03, 1945 (75 y.o. Male) Rhae Hammock Primary Care Danayah Smyre: Consuello Masse Other Clinician: Referring Razi Hickle: Treating Kinney Sackmann/Extender: Zadie Cleverly in Treatment: 0 Active Problems Location of Pain Severity and Description of Pain Patient Has Paino No Site Locations Rate the pain. Rate the pain. Current Pain Level: 0 Pain Management and Medication Current Pain Management: Electronic Signature(s) Signed: 09/27/2020 5:01:26 PM By: Rhae Hammock RN Entered By: Rhae Hammock on 09/27/2020 08:05:26 -------------------------------------------------------------------------------- Patient/Caregiver Education Details Patient Name: Date of Service: [name] [name] 4/1/2022andnbsp7:30 [place] Record Number: [id number] Patient Account Number: [id number] Date of Birth/Gender: Treating RN: [birth year] (75 y.o. Male) Lorrin Jackson Primary Care Physician: Consuello Masse Other Clinician: Referring Physician: Treating Physician/Extender: Zadie Cleverly in Treatment: 0 Education Assessment Education Provided To: Patient Education Topics Provided Elevated Blood Sugar/ Impact on Healing: Handouts: Elevated Blood  Sugars: How Do They Affect Wound Healing Methods: Explain/Verbal, Printed Responses: State content correctly [place]: o Handouts: Welcome T [facility] [place] o Methods: Explain/Verbal, Printed Responses: State content correctly Wound/Skin Impairment: Methods: Explain/Verbal, Printed Responses: State content correctly Electronic Signature(s) Signed: 09/27/2020 5:18:29 PM By: Lorrin Jackson Entered By: Lorrin Jackson on 09/27/2020 08:39:07 -------------------------------------------------------------------------------- Wound Assessment Details Patient Name: Date of Service: [name] [name] 09/27/2020 7:30 A M Medical Record Number: [id number] Patient Account Number: [id number] Date of Birth/Sex: Treating RN: [birth year] (75 y.o. Male) Rhae Hammock Primary Care Julianny Milstein: Consuello Masse Other Clinician: Referring Aveline Daus: Treating Delfin Squillace/Extender: Zadie Cleverly in Treatment: 0 Wound Status Wound Number: 1 Primary Diabetic Wound/Ulcer of the Lower Extremity Etiology: Wound Location: Left, Plantar Metatarsal head fifth Wound Open Wounding Event: Gradually Appeared Status: Date Acquired: 06/29/2020 Comorbid Sleep Apnea, Arrhythmia, Congestive Heart Failure, Coronary Weeks Of Treatment: 0 History: Artery Disease, Type II Diabetes, Osteoarthritis, Neuropathy Clustered Wound: No Photos Wound Measurements Length: (cm) 0.3 Width: (cm) 0.3 Depth: (cm) 0.3 Area: (cm) 0.071 Volume: (cm) 0.021 % Reduction in Area: 0% % Reduction in Volume: 0% Epithelialization: None Tunneling: No Undermining: No Wound Description Classification: Grade 2 Wound Margin: Distinct, outline attached Exudate Amount: Medium Exudate Type: Serosanguineous Exudate Color: red, brown Foul Odor After Cleansing: No Slough/Fibrino No Wound Bed Granulation Amount: Large (67-100%) Exposed Structure Granulation Quality: Red, Pink Fascia Exposed:  No Necrotic Amount: None Present (0%) Fat Layer (Subcutaneous Tissue) Exposed: No Tendon Exposed: No Muscle Exposed: No Joint Exposed: No Bone Exposed: No Electronic Signature(s) Signed: 09/27/2020 5:01:26 PM By: Rhae Hammock RN Signed: 09/30/2020 10:23:55 AM By: Sandre Kitty Entered  By: Sandre Kitty on 09/27/2020 16:23:38 -------------------------------------------------------------------------------- Vitals Details Patient Name: Date of Service: [name] [name], [name] [name] 09/27/2020 7:30 A M Medical Record Number: [id number] Patient Account Number: [id number] Date of Birth/Sex: Treating RN: May 26, 1946 (75 y.o. Male) Lorrin Jackson Primary Care Edgardo Petrenko: Consuello Masse Other Clinician: Referring Katey Barrie: Treating Jasmin Trumbull/Extender: Zadie Cleverly in Treatment: 0 Vital Signs Time Taken: 07:43 Temperature (F): 98.1 Height (in): 74 Pulse (bpm): 61 Source: Stated Respiratory Rate (breaths/min): 18 Weight (lbs): 238 Blood Pressure (mmHg): 105/67 Source: Stated Reference Range: 80 - 120 mg / dl Body Mass Index (BMI): 30.6 Electronic Signature(s) Signed: 09/27/2020 7:49:02 AM By: Sandre Kitty Entered By: Sandre Kitty on 09/27/2020 07:44:29

## 2020-09-30 ENCOUNTER — Other Ambulatory Visit: Payer: Self-pay | Admitting: *Deleted

## 2020-09-30 DIAGNOSIS — Z01818 Encounter for other preprocedural examination: Secondary | ICD-10-CM

## 2020-09-30 NOTE — Telephone Encounter (Signed)
Call and spoke with pt, pt stated that he will [facility] to [facility] in [place] and get CBC drawn on Wednesday April 6th.

## 2020-10-01 DIAGNOSIS — Z01818 Encounter for other preprocedural examination: Secondary | ICD-10-CM | POA: Diagnosis not present

## 2020-10-02 LAB — CBC
Hematocrit: 38.6 % (ref 37.5–51.0)
Hemoglobin: 12.2 g/dL — ABNORMAL LOW (ref 13.0–17.7)
MCH: 27.9 pg (ref 26.6–33.0)
MCHC: 31.6 g/dL (ref 31.5–35.7)
MCV: 88 fL (ref 79–97)
Platelets: 209 10*3/uL (ref 150–450)
RBC: 4.38 x10E6/uL (ref 4.14–5.80)
RDW: 13.5 % (ref 11.6–15.4)
WBC: 5.4 10*3/uL (ref 3.4–10.8)

## 2020-10-02 NOTE — Telephone Encounter (Signed)
CBC checked yesterday - platelet count stable at 209K  [facility] to hold Eliquis for 2 days before and 2 days after procedure as requested.

## 2020-10-04 ENCOUNTER — Other Ambulatory Visit: Payer: Self-pay

## 2020-10-04 ENCOUNTER — Encounter (HOSPITAL_BASED_OUTPATIENT_CLINIC_OR_DEPARTMENT_OTHER): Payer: Medicare Other | Admitting: Internal Medicine

## 2020-10-04 ENCOUNTER — Other Ambulatory Visit (HOSPITAL_COMMUNITY)
Admission: RE | Admit: 2020-10-04 | Discharge: 2020-10-04 | Disposition: A | Discharge status: Home / Self Care | Payer: Medicare Other | Attending: Internal Medicine | Admitting: Internal Medicine

## 2020-10-04 DIAGNOSIS — L97512 Non-pressure chronic ulcer of other part of right foot with fat layer exposed: Secondary | ICD-10-CM | POA: Diagnosis not present

## 2020-10-04 DIAGNOSIS — I779 Disorder of arteries and arterioles, unspecified: Secondary | ICD-10-CM | POA: Diagnosis not present

## 2020-10-04 DIAGNOSIS — L97528 Non-pressure chronic ulcer of other part of left foot with other specified severity: Secondary | ICD-10-CM | POA: Insufficient documentation

## 2020-10-04 DIAGNOSIS — L03116 Cellulitis of left lower limb: Secondary | ICD-10-CM | POA: Diagnosis not present

## 2020-10-04 DIAGNOSIS — I255 Ischemic cardiomyopathy: Secondary | ICD-10-CM | POA: Diagnosis not present

## 2020-10-04 DIAGNOSIS — E1151 Type 2 diabetes mellitus with diabetic peripheral angiopathy without gangrene: Secondary | ICD-10-CM | POA: Diagnosis not present

## 2020-10-04 DIAGNOSIS — E11621 Type 2 diabetes mellitus with foot ulcer: Secondary | ICD-10-CM | POA: Insufficient documentation

## 2020-10-04 NOTE — Progress Notes (Signed)
OSVALDO, LAMPING ([id number]) Visit Report for 10/04/2020 Debridement Details Patient Name: Date of Service: KEIRAN, GAFFEY 10/04/2020 8:30 A M Medical Record Number: [id number] Patient Account Number: [id number] Date of Birth/Sex: Treating RN: 12/09/1945 (75 y.o. [name] Primary Care Provider: Consuello Masse Other Clinician: Referring Provider: Treating Provider/Extender: Zadie Cleverly in Treatment: 1 Debridement Performed for Assessment: Wound #1 Left,Plantar Metatarsal head fifth Performed By: Physician Ricard Dillon., MD Debridement Type: Debridement Severity of Tissue Pre Debridement: Fat layer exposed Level of Consciousness (Pre-procedure): Awake and Alert Pre-procedure Verification/Time Out Yes - 08:48 Taken: Start Time: 08:49 T Area Debrided (L x W): otal 0.9 (cm) x 0.8 (cm) = 0.72 (cm) Tissue and other material debrided: Non-Viable, Subcutaneous Level: Skin/Subcutaneous Tissue Debridement Description: Excisional Instrument: Curette Specimen: Swab, Number of Specimens T aken: 1 Bleeding: Moderate Hemostasis Achieved: Silver Nitrate End Time: 08:54 Response to Treatment: Procedure was tolerated well Level of Consciousness (Post- Awake and Alert procedure): Post Debridement Measurements of Total Wound Length: (cm) 0.9 Width: (cm) 0.8 Depth: (cm) 0.3 Volume: (cm) 0.17 Character of Wound/Ulcer Post Debridement: Stable Severity of Tissue Post Debridement: Fat layer exposed Post Procedure Diagnosis Same as Pre-procedure Electronic Signature(s) Signed: 10/04/2020 4:45:31 PM By: Linton Ham MD Signed: 10/04/2020 5:03:53 PM By: Lorrin Jackson Entered By: Linton Ham on 10/04/2020 09:18:10 -------------------------------------------------------------------------------- HPI Details Patient Name: Date of Service: [name] 10/04/2020 8:30 A M Medical Record Number: [id number] Patient Account Number: [id number] Date of  Birth/Sex: Treating RN: 04-09-1946 (75 y.o. [name] Primary Care Provider: Consuello Masse Other Clinician: Referring Provider: Treating Provider/Extender: Zadie Cleverly in Treatment: 1 History of Present Illness HPI Description: ADMISSION 09/27/2020 This is a 75 year old man who lives in [place]. He apparently has had callus over the plantar fifth metatarsal head in the past for which she is followed by podiatry in [place]. They shaved down the callus and this was done in January. He states that he went to [place] in January and became aware of when he was there of an open wound in this area. He followed up with podiatry and has been soaking this twice a day with Epson salts and using Silvadene. Apparently one of the podiatrist told him he may need surgery because of bone protrusion. Past medical history includes an ischemic cardiomyopathy, type 2 diabetes with peripheral neuropathy, BPH, PVD, orthostatic hypotension, atrial fibrillation, hyperlipidemia and hypertension Arterial studies in 2018 showed a noncompressible ABI on the left. It was again noncompressible today at 1.7 although his pulses are easily palpable 4/8; patient I admitted to the clinic last week. Wound on the plantar left fifth metatarsal head which has been refractory. He also has a bit of subluxation of the bone in the head. He is a type II diabetic with peripheral neuropathy. We used silver collagen after debridement He arrives back in clinic today. He has erythema spreading into the lateral part of the fifth metatarsal head. I removed some undermining tissue from around the wound and clearly there is a connection between the wound and the lateral part of the fifth met head. A culture was done. Area of erythema on the lateral part of the foot was marked. His wife says that has been there since Wednesday Electronic Signature(s) Signed: 10/04/2020 4:45:31 PM By: Linton Ham MD Entered  By: Linton Ham on 10/04/2020 09:19:41 -------------------------------------------------------------------------------- Physical Exam Details Patient Name: Date of Service: [name] 10/04/2020 8:30 A M Medical Record Number:  [id number] Patient Account Number: [id number] Date of Birth/Sex: Treating RN: [birth year] (75 y.o. [name] Primary Care Provider: Consuello Masse Other Clinician: Referring Provider: Treating Provider/Extender: Zadie Cleverly in Treatment: 1 Constitutional Sitting or standing Blood Pressure is within target range for patient.. Pulse regular and within target range for patient.Marland Kitchen Respirations regular, non-labored and within target range.. Temperature is normal and within the target range for the patient.Marland Kitchen Appears in no distress. Notes Wound exam; some undermining still superiorly and laterally I remove this with a #3 curette. Using a skinny I was able to prove the connection between the wound and the undermining area on the lateral part of the foot. There is erythema here this is all new this week there is no palpable tenderness however probably secondary to the neuropathy. I obtained a culture from drainage Electronic Signature(s) Signed: 10/04/2020 4:45:31 PM By: Linton Ham MD Entered By: Linton Ham on 10/04/2020 09:20:45 -------------------------------------------------------------------------------- Physician Orders Details Patient Name: Date of Service: [name] 10/04/2020 8:30 A M Medical Record Number: [id number] Patient Account Number: [id number] Date of Birth/Sex: Treating RN: 10/25/1945 (74 y.o. [name] Primary Care Provider: Consuello Masse Other Clinician: Referring Provider: Treating Provider/Extender: Zadie Cleverly in Treatment: 1 Verbal / Phone Orders: No Diagnosis Coding ICD-10 Coding Code Description E11.621 Type 2 diabetes mellitus with foot ulcer L97.528  Non-pressure chronic ulcer of other part of left foot with other specified severity E11.42 Type 2 diabetes mellitus with diabetic polyneuropathy Follow-up Appointments Return Appointment in 1 week. Bathing/ Shower/ Hygiene May shower with protection but do not get wound dressing(s) wet. - On no dressing change days May shower and wash wound with soap and water. - on days changing dressing. Do not soak foot. Edema Control - Lymphedema / SCD / Other Elevate legs to the level of the heart or above for 30 minutes daily and/or when sitting, a frequency of: Avoid standing for long periods of time. Off-Loading Open toe surgical shoe to: - With Insert-Reduce pressure and friction to area as much as possible. Additional Orders / Instructions Follow Nutritious Diet Wound Treatment Wound #1 - Metatarsal head fifth Wound Laterality: Plantar, Left Cleanser: Soap and Water Every Other Day/15 Days Discharge Instructions: May shower and wash wound with dial antibacterial soap and water prior to dressing change. Prim Dressing: KerraCel Ag Gelling Fiber Dressing, 2x2 in (silver alginate) (Generic) Every Other Day/15 Days ary Discharge Instructions: Apply silver alginate to wound bed as instructed Secondary Dressing: Woven Gauze Sponges 2x2 in Every Other Day/15 Days Discharge Instructions: Apply over primary dressing as directed. Secondary Dressing: Optifoam Non-Adhesive Dressing, 4x4 in Every Other Day/15 Days Discharge Instructions: Apply foam donut over primary dressing Secured With: Kerlix Roll Sterile, 4.5x3.1 (in/yd) Every Other Day/15 Days Discharge Instructions: Secure with Kerlix as directed. Secured With: Transpore Surgical Tape, 2x10 (in/yd) Every Other Day/15 Days Discharge Instructions: Secure dressing with tape as directed. Laboratory erobe culture (MICRO) - Prescription for Augmentin sent to pharmacy, take as prescribed. Bacteria identified in Unspecified specimen by A LOINC Code:  702-6 Convenience Name: Areobic culture-specimen not specified Patient Medications llergies: No Known Allergies A Notifications Medication Indication Start End wound infection left 10/04/2020 Augmentin foot DOSE oral 875 mg-125 mg tablet - 1tablet oral bid for 7 days Electronic Signature(s) Signed: 10/04/2020 9:10:19 AM By: Linton Ham MD Previous Signature: 10/04/2020 8:26:51 AM Version By: Lorrin Jackson Entered By: Linton Ham on 10/04/2020 09:10:18 -------------------------------------------------------------------------------- Problem List Details Patient Name: Date of Service: Durene Romans,  BA RRY G. 10/04/2020 8:30 A M Medical Record Number: [id number] Patient Account Number: [id number] Date of Birth/Sex: Treating RN: 10-04-1945 (75 y.o. [name] Primary Care Provider: Consuello Masse Other Clinician: Referring Provider: Treating Provider/Extender: Zadie Cleverly in Treatment: 1 Active Problems ICD-10 Encounter Code Description Active Date MDM Diagnosis E11.621 Type 2 diabetes mellitus with foot ulcer 09/27/2020 No Yes L97.528 Non-pressure chronic ulcer of other part of left foot with other specified 09/27/2020 No Yes severity E11.42 Type 2 diabetes mellitus with diabetic polyneuropathy 09/27/2020 No Yes L03.116 Cellulitis of left lower limb 10/04/2020 No Yes Inactive Problems Resolved Problems Electronic Signature(s) Signed: 10/04/2020 4:45:31 PM By: Linton Ham MD Previous Signature: 10/04/2020 8:26:30 AM Version By: Lorrin Jackson Entered By: Linton Ham on 10/04/2020 09:17:51 -------------------------------------------------------------------------------- Progress Note Details Patient Name: Date of Service: [name] 10/04/2020 8:30 A M Medical Record Number: [id number] Patient Account Number: [id number] Date of Birth/Sex: Treating RN: 23-May-1946 (75 y.o. [name] Primary Care Provider: Consuello Masse Other Clinician: Referring  Provider: Treating Provider/Extender: Zadie Cleverly in Treatment: 1 Subjective History of Present Illness (HPI) ADMISSION 09/27/2020 This is a 75 year old man who lives in [place]. He apparently has had callus over the plantar fifth metatarsal head in the past for which she is followed by podiatry in [place]. They shaved down the callus and this was done in January. He states that he went to [place] in January and became aware of when he was there of an open wound in this area. He followed up with podiatry and has been soaking this twice a day with Epson salts and using Silvadene. Apparently one of the podiatrist told him he may need surgery because of bone protrusion. Past medical history includes an ischemic cardiomyopathy, type 2 diabetes with peripheral neuropathy, BPH, PVD, orthostatic hypotension, atrial fibrillation, hyperlipidemia and hypertension Arterial studies in 2018 showed a noncompressible ABI on the left. It was again noncompressible today at 1.7 although his pulses are easily palpable 4/8; patient I admitted to the clinic last week. Wound on the plantar left fifth metatarsal head which has been refractory. He also has a bit of subluxation of the bone in the head. He is a type II diabetic with peripheral neuropathy. We used silver collagen after debridement He arrives back in clinic today. He has erythema spreading into the lateral part of the fifth metatarsal head. I removed some undermining tissue from around the wound and clearly there is a connection between the wound and the lateral part of the fifth met head. A culture was done. Area of erythema on the lateral part of the foot was marked. His wife says that has been there since Wednesday Objective Constitutional Sitting or standing Blood Pressure is within target range for patient.. Pulse regular and within target range for patient.Marland Kitchen Respirations regular, non-labored and within target range..  Temperature is normal and within the target range for the patient.Marland Kitchen Appears in no distress. Vitals Time Taken: 8:31 AM, Height: 74 in, Weight: 238 lbs, BMI: 30.6, Temperature: 97.7 F, Pulse: 50 bpm, Respiratory Rate: 17 breaths/min, Blood Pressure: 108/55 mmHg. General Notes: Wound exam; some undermining still superiorly and laterally I remove this with a #3 curette. Using a skinny I was able to prove the connection between the wound and the undermining area on the lateral part of the foot. There is erythema here this is all new this week there is no palpable tenderness however probably secondary to the neuropathy. I  obtained a culture from drainage Integumentary (Hair, Skin) Wound #1 status is Open. Original cause of wound was Gradually Appeared. The date acquired was: 06/29/2020. The wound has been in treatment 1 weeks. The wound is located on the Left,Plantar Metatarsal head fifth. The wound measures 0.9cm length x 0.8cm width x 0.3cm depth; 0.565cm^2 area and 0.17cm^3 volume. Tunneling has been noted at 3:00 with a maximum distance of 2cm. Undermining begins at 6:00 and ends at 9:00 with a maximum distance of 0.4cm. There is a medium amount of serosanguineous drainage noted. The wound margin is distinct with the outline attached to the wound base. There is large (67- 100%) red, pink granulation within the wound bed. There is no necrotic tissue within the wound bed. Assessment Active Problems ICD-10 Type 2 diabetes mellitus with foot ulcer Non-pressure chronic ulcer of other part of left foot with other specified severity Type 2 diabetes mellitus with diabetic polyneuropathy Cellulitis of left lower limb Procedures Wound #1 Pre-procedure diagnosis of Wound #1 is a Diabetic Wound/Ulcer of the Lower Extremity located on the Left,Plantar Metatarsal head fifth .Severity of Tissue Pre Debridement is: Fat layer exposed. There was a Excisional Skin/Subcutaneous Tissue Debridement with a total area  of 0.72 sq cm performed by Ricard Dillon., MD. With the following instrument(s): Curette to remove Non-Viable tissue/material. Material removed includes Subcutaneous Tissue. 1 specimen was taken by a Swab and sent to the lab per facility protocol. A time out was conducted at 08:48, prior to the start of the procedure. A Moderate amount of bleeding was controlled with Silver Nitrate. The procedure was tolerated well. Post Debridement Measurements: 0.9cm length x 0.8cm width x 0.3cm depth; 0.17cm^3 volume. Character of Wound/Ulcer Post Debridement is stable. Severity of Tissue Post Debridement is: Fat layer exposed. Post procedure Diagnosis Wound #1: Same as Pre-Procedure Plan Follow-up Appointments: Return Appointment in 1 week. Bathing/ Shower/ Hygiene: May shower with protection but do not get wound dressing(s) wet. - On no dressing change days May shower and wash wound with soap and water. - on days changing dressing. Do not soak foot. Edema Control - Lymphedema / SCD / Other: Elevate legs to the level of the heart or above for 30 minutes daily and/or when sitting, a frequency of: Avoid standing for long periods of time. Off-Loading: Open toe surgical shoe to: - With Insert-Reduce pressure and friction to area as much as possible. Additional Orders / Instructions: Follow Nutritious Diet Laboratory ordered were: Areobic culture-specimen not specified - Prescription for Augmentin sent to pharmacy, take as prescribed. The following medication(s) was prescribed: Augmentin oral 875 mg-125 mg tablet 1tablet oral bid for 7 days for wound infection left foot starting 10/04/2020 WOUND #1: - Metatarsal head fifth Wound Laterality: Plantar, Left Cleanser: Soap and Water Every Other Day/15 Days Discharge Instructions: May shower and wash wound with dial antibacterial soap and water prior to dressing change. Prim Dressing: KerraCel Ag Gelling Fiber Dressing, 2x2 in (silver alginate) (Generic) Every  Other Day/15 Days ary Discharge Instructions: Apply silver alginate to wound bed as instructed Secondary Dressing: Woven Gauze Sponges 2x2 in Every Other Day/15 Days Discharge Instructions: Apply over primary dressing as directed. Secondary Dressing: Optifoam Non-Adhesive Dressing, 4x4 in Every Other Day/15 Days Discharge Instructions: Apply foam donut over primary dressing Secured With: Kerlix Roll Sterile, 4.5x3.1 (in/yd) Every Other Day/15 Days Discharge Instructions: Secure with Kerlix as directed. Secured With: Transpore Surgical T ape, 2x10 (in/yd) Every Other Day/15 Days Discharge Instructions: Secure dressing with tape as directed. 1.  I change the dressing to silver alginate 2. CandS obtained 3. Empiric Augmentin while we wait for the results of the culture 4. The patient is using surgical sandals with a thick felt insole cut out on the fifth met head area but it is possible he is still putting more weight on this area then he needs to. I again talked to the patient about offloading this area possibly up to and including a total contact cast. He said he was somewhat concerned about imbalance even in his surgical shoe but I think we may be heading to this probably in the next 2 to 3 weeks Electronic Signature(s) Signed: 10/04/2020 4:45:31 PM By: Linton Ham MD Entered By: Linton Ham on 10/04/2020 09:21:58 -------------------------------------------------------------------------------- SuperBill Details Patient Name: Date of Service: [name] 10/04/2020 Medical Record Number: [id number] Patient Account Number: [id number] Date of Birth/Sex: Treating RN: Oct 16, 1945 (75 y.o. [name] Primary Care Provider: Consuello Masse Other Clinician: Referring Provider: Treating Provider/Extender: Zadie Cleverly in Treatment: 1 Diagnosis Coding ICD-10 Codes Code Description [phone] Type 2 diabetes mellitus with foot ulcer L97.528 Non-pressure chronic  ulcer of other part of left foot with other specified severity E11.42 Type 2 diabetes mellitus with diabetic polyneuropathy Facility Procedures CPT4 Code: [id number] Description: 66440 - DEB SUBQ TISSUE 20 SQ CM/< ICD-10 Diagnosis Description L97.528 Non-pressure chronic ulcer of other part of left foot with other specified seve E11.621 Type 2 diabetes mellitus with foot ulcer Modifier: rity Quantity: 1 Physician Procedures : CPT4 Code Description Modifier [id number] - WC PHYS SUBQ TISS 20 SQ CM ICD-10 Diagnosis Description L97.528 Non-pressure chronic ulcer of other part of left foot with other specified severity E11.621 Type 2 diabetes mellitus with foot ulcer Quantity: 1 Electronic Signature(s) Signed: 10/04/2020 4:45:31 PM By: Linton Ham MD Entered By: Linton Ham on 10/04/2020 09:22:09

## 2020-10-04 NOTE — Progress Notes (Addendum)
[name] [name] ([id number]) Visit Report for 10/04/2020 Arrival Information Details Patient Name: Date of Service: [name] [name], [name] [name] 10/04/2020 8:30 A M Medical Record Number: [id number] Patient Account Number: [id number] Date of Birth/Sex: Treating RN: [birth year] (75 y.o. [name] [name], [name] [name] Primary Care [name] [name]: [name] [name] Other Clinician: Referring [name] [name]: Treating [name] [name]/Extender: [name] [name] in Treatment: 1 Visit Information History Since Last Visit Added or deleted any medications: No Patient Arrived: Ambulatory Any new allergies or adverse reactions: No Arrival Time: 08:30 Had a fall or experienced change in No Accompanied By: fam activities of daily living that may affect Transfer Assistance: None risk of falls: Patient Identification Verified: Yes Signs or symptoms of abuse/neglect since last visito No Secondary Verification Process Completed: Yes Hospitalized since last visit: No Has Dressing in Place as Prescribed: Yes Pain Present Now: No Electronic Signature(s) Signed: 10/07/2020 5:44:40 PM By: [name] Hammock RN Entered By: [name] [name] on 10/04/2020 08:31:17 -------------------------------------------------------------------------------- Encounter Discharge Information Details Patient Name: Date of Service: [name] [name] 10/04/2020 8:30 A M Medical Record Number: [id number] Patient Account Number: [id number] Date of Birth/Sex: Treating RN: [birth year] (75 y.o. [name] [name] Primary Care Khristian Phillippi: [name] [name] Other Clinician: Referring [name] [name]: Treating [name] [name]: [name] [name] in Treatment: 1 Encounter Discharge Information Items Post Procedure Vitals Discharge Condition: Stable Temperature (F): 97.7 Ambulatory Status: Ambulatory Pulse (bpm): 50 Discharge Destination: Home Respiratory Rate (breaths/min): 17 Transportation: Private Auto Blood Pressure (mmHg): 108/55 Accompanied By:  friend Schedule Follow-up Appointment: Yes Clinical Summary of Care: Electronic Signature(s) Signed: 10/04/2020 4:53:51 PM By: [name] [name] Entered By: [name] [name] on 10/04/2020 09:21:08 -------------------------------------------------------------------------------- Lower Extremity Assessment Details Patient Name: Date of Service: [name] [name] 10/04/2020 8:30 A M Medical Record Number: [id number] Patient Account Number: [id number] Date of Birth/Sex: Treating RN: May 05, 1946 (75 y.o. [name] [name], [name] [name] Primary Care [name] [name]: [name] [name] Other Clinician: Referring [name] [name]: Treating [name] [name]/Extender: [name] [name] in Treatment: 1 Edema Assessment Assessed: [name] [name]: Yes] [name] [name]: No] Edema: [Left: Ye] [Right: s] Calf Left: Right: Point of Measurement: 40 cm From Medial Instep 37 cm Ankle Left: Right: Point of Measurement: 10 cm From Medial Instep 24 cm Vascular Assessment Pulses: Dorsalis Pedis Palpable: [Left:Yes] Posterior Tibial Palpable: [Left:Yes] Electronic Signature(s) Signed: 10/07/2020 5:44:40 PM By: [name] Hammock RN Entered By: [name] [name] on 10/04/2020 08:34:03 -------------------------------------------------------------------------------- Multi Wound Chart Details Patient Name: Date of Service: [name] [name] 10/04/2020 8:30 A M Medical Record Number: [id number] Patient Account Number: [id number] Date of Birth/Sex: Treating RN: 1946/01/30 (75 y.o. [name] [name] Primary Care [name] [name]: [name] [name] Other Clinician: Referring Caylen Kuwahara: Treating [name] [name]/Extender: [name] [name] in Treatment: 1 Vital Signs Height(in): 74 Pulse(bpm): 77 Weight(lbs): 71 Blood Pressure(mmHg): 108/55 Body Mass Index(BMI): 31 Temperature(F): 97.7 Respiratory Rate(breaths/min): 17 Photos: [1:No Photos Left, Plantar Metatarsal head fifth] [N/A:N/A N/A] Wound Location: [1:Gradually Appeared] [N/A:N/A] Wounding  Event: [1:Diabetic Wound/Ulcer of the Lower] [N/A:N/A] Primary Etiology: [1:Extremity Sleep Apnea, Arrhythmia, Congestive] [N/A:N/A] Comorbid History: [1:Heart Failure, Coronary Artery Disease, Type II Diabetes, Osteoarthritis, Neuropathy 06/29/2020] [N/A:N/A] Date Acquired: [1:1] [N/A:N/A] Weeks of Treatment: [1:Open] [N/A:N/A] Wound Status: [1:0.9x0.8x0.3] [N/A:N/A] Measurements L x W x D (cm) [1:0.565] [N/A:N/A] A (cm) : rea [1:0.17] [N/A:N/A] Volume (cm) : [1:-695.80%] [N/A:N/A] % Reduction in A rea: [1:-709.50%] [N/A:N/A] % Reduction in Volume: [1:3] Position 1 (o'clock): [1:2] Maximum Distance 1 (cm): [1:6] Starting Position 1 (o'clock): [1:9] Ending Position 1 (o'clock): [1:0.4] Maximum Distance 1 (cm): [1:Yes] [N/A:N/A] Tunneling: [1:Yes] [N/A:N/A] Undermining: [1:Grade 2] [N/A:N/A]  Classification: [1:Medium] [N/A:N/A] Exudate A mount: [1:Serosanguineous] [N/A:N/A] Exudate Type: [1:red, brown] [N/A:N/A] Exudate Color: [1:Distinct, outline attached] [N/A:N/A] Wound Margin: [1:Large (67-100%)] [N/A:N/A] Granulation A mount: [1:Red, Pink] [N/A:N/A] Granulation Quality: [1:None Present (0%)] [N/A:N/A] Necrotic A mount: [1:Fascia: No] [N/A:N/A] Exposed Structures: [1:Fat Layer (Subcutaneous Tissue): No Tendon: No Muscle: No Joint: No Bone: No None] [N/A:N/A] Epithelialization: [1:Debridement - Excisional] [N/A:N/A] Debridement: Pre-procedure Verification/Time Out 08:48 [N/A:N/A] Taken: [1:Subcutaneous] [N/A:N/A] Tissue Debrided: [1:Skin/Subcutaneous Tissue] [N/A:N/A] Level: [1:0.72] [N/A:N/A] Debridement A (sq cm): [1:rea Curette] [N/A:N/A] Instrument: [1:Swab] [N/A:N/A] Specimen: [1:1] [N/A:N/A] Number of Specimens Taken: [1:Moderate] [N/A:N/A] Bleeding: [1:Silver Nitrate] [N/A:N/A] Hemostasis A chieved: [1:Procedure was tolerated well] [N/A:N/A] Debridement Treatment Response: [1:0.9x0.8x0.3] [N/A:N/A] Post Debridement Measurements L x W x D (cm) [1:0.17]  [N/A:N/A] Post Debridement Volume: (cm) [1:Debridement] [N/A:N/A] Treatment Notes Electronic Signature(s) Signed: 10/04/2020 4:45:31 PM By: Linton Ham MD Signed: 10/04/2020 5:03:53 PM By: Lorrin Jackson Entered By: Linton Ham on 10/04/2020 09:17:59 -------------------------------------------------------------------------------- Multi-Disciplinary Care Plan Details Patient Name: Date of Service: [name] [name] 10/04/2020 8:30 A M Medical Record Number: [id number] Patient Account Number: [id number] Date of Birth/Sex: Treating RN: Jun 15, 1946 (74 y.o. [name] [name] Primary Care Ido Wollman: [name] [name] Other Clinician: Referring Mikey Maffett: Treating Clayvon Parlett/Extender: [name] [name] in Treatment: 1 Active Inactive Nutrition Nursing Diagnoses: Impaired glucose control: actual or potential Goals: Patient/caregiver verbalizes understanding of need to maintain therapeutic glucose control per primary care physician Date Initiated: 09/27/2020 Target Resolution Date: 10/25/2020 Goal Status: Active Interventions: Assess HgA1c results as ordered upon admission and as needed Assess patient nutrition upon admission and as needed per policy Provide education on elevated blood sugars and impact on wound healing Provide education on nutrition Notes: Orientation to the Wound Care Program Nursing Diagnoses: Knowledge deficit related to the wound healing center program Goals: Patient/caregiver will verbalize understanding of the [place] Program Date Initiated: 09/27/2020 Target Resolution Date: 10/25/2020 Goal Status: Active Interventions: Provide education on orientation to the wound center Notes: Wound/Skin Impairment Nursing Diagnoses: Impaired tissue integrity Goals: Patient/caregiver will verbalize understanding of skin care regimen Date Initiated: 09/27/2020 Target Resolution Date: 10/25/2020 Goal Status: Active Ulcer/skin breakdown will have a  volume reduction of 30% by week 4 Date Initiated: 09/27/2020 Target Resolution Date: 10/25/2020 Goal Status: Active Interventions: Assess patient/caregiver ability to obtain necessary supplies Assess patient/caregiver ability to perform ulcer/skin care regimen upon admission and as needed Assess ulceration(s) every visit Provide education on ulcer and skin care Treatment Activities: Skin care regimen initiated : 09/27/2020 Topical wound management initiated : 09/27/2020 Notes: Electronic Signature(s) Signed: 10/04/2020 8:27:01 AM By: Lorrin Jackson Entered By: Lorrin Jackson on 10/04/2020 08:27:01 -------------------------------------------------------------------------------- Pain Assessment Details Patient Name: Date of Service: [name] [name] 10/04/2020 8:30 A M Medical Record Number: [id number] Patient Account Number: [id number] Date of Birth/Sex: Treating RN: 18-May-1946 (75 y.o. [name] Primary Care Nikyah Lackman: [name] [name] Other Clinician: Referring Taunia Frasco: Treating Brizeyda Holtmeyer/Extender: [name] [name] in Treatment: 1 Active Problems Location of Pain Severity and Description of Pain Patient Has Paino No Site Locations Rate the pain. Rate the pain. Current Pain Level: 0 Pain Management and Medication Current Pain Management: Electronic Signature(s) Signed: 10/07/2020 5:44:40 PM By: [name] Hammock RN Entered By: [name] [name] on 10/04/2020 08:32:18 -------------------------------------------------------------------------------- Patient/Caregiver Education Details Patient Name: Date of Service: Scronce, BA RRY G. 4/8/2022andnbsp8:30 A M Medical Record Number: [id number] Patient Account Number: [id number] Date of Birth/Gender: Treating RN: 10/28/1945 (75 y.o. [name] [name] Primary Care Physician: [name] [name] Other Clinician: Referring Physician: Treating  Physician/Extender: [name] [name] in Treatment:  1 Education Assessment Education Provided To: Patient Education Topics Provided Elevated Blood Sugar/ Impact on Healing: Methods: Explain/Verbal, Printed Responses: State content correctly Offloading: Methods: Explain/Verbal, Printed Responses: State content correctly Wound/Skin Impairment: Methods: Explain/Verbal, Printed Responses: State content correctly Electronic Signature(s) Signed: 10/04/2020 5:03:53 PM By: Lorrin Jackson Entered By: Lorrin Jackson on 10/04/2020 08:27:40 -------------------------------------------------------------------------------- Wound Assessment Details Patient Name: Date of Service: [name] [name] 10/04/2020 8:30 A M Medical Record Number: [id number] Patient Account Number: [id number] Date of Birth/Sex: Treating RN: 03/22/1946 (75 y.o. [name] [name], [name] [name] Primary Care Haydan Mansouri: [name] [name] Other Clinician: Referring Omran Keelin: Treating Fredi Geiler/Extender: [name] [name] in Treatment: 1 Wound Status Wound Number: 1 Primary Diabetic Wound/Ulcer of the Lower Extremity Etiology: Wound Location: Left, Plantar Metatarsal head fifth Wound Open Wounding Event: Gradually Appeared Status: Date Acquired: 06/29/2020 Comorbid Sleep Apnea, Arrhythmia, Congestive Heart Failure, Coronary Weeks Of Treatment: 1 History: Artery Disease, Type II Diabetes, Osteoarthritis, Neuropathy Clustered Wound: No Photos Wound Measurements Length: (cm) 0.9 Width: (cm) 0.8 Depth: (cm) 0.3 Area: (cm) 0.565 Volume: (cm) 0.17 % Reduction in Area: -695.8% % Reduction in Volume: -709.5% Epithelialization: None Tunneling: Yes Position (o'clock): 3 Maximum Distance: (cm) 2 Undermining: Yes Starting Position (o'clock): 6 Ending Position (o'clock): 9 Maximum Distance: (cm) 0.4 Wound Description Classification: Grade 2 Wound Margin: Distinct, outline attached Exudate Amount: Medium Exudate Type: Serosanguineous Exudate Color: red, brown Foul Odor  After Cleansing: No Slough/Fibrino No Wound Bed Granulation Amount: Large (67-100%) Exposed Structure Granulation Quality: Red, Pink Fascia Exposed: No Necrotic Amount: None Present (0%) Fat Layer (Subcutaneous Tissue) Exposed: No Tendon Exposed: No Muscle Exposed: No Joint Exposed: No Bone Exposed: No Treatment Notes Wound #1 (Metatarsal head fifth) Wound Laterality: Plantar, Left Cleanser Soap and Water Discharge Instruction: May shower and wash wound with dial antibacterial soap and water prior to dressing change. Peri-Wound Care Topical Primary Dressing KerraCel Ag Gelling Fiber Dressing, 2x2 in (silver alginate) Discharge Instruction: Apply silver alginate to wound bed as instructed Secondary Dressing Woven Gauze Sponges 2x2 in Discharge Instruction: Apply over primary dressing as directed. Optifoam Non-Adhesive Dressing, 4x4 in Discharge Instruction: Apply foam donut over primary dressing Secured With Kerlix Roll Sterile, 4.5x3.1 (in/yd) Discharge Instruction: Secure with Kerlix as directed. Transpore Surgical Tape, 2x10 (in/yd) Discharge Instruction: Secure dressing with tape as directed. Compression Wrap Compression Stockings Add-Ons Notes explained to patient and family member to start taking abx this morning and to closely monitor for increasing infection. Patient and family member in agreement. surgical shoe with insert applied to left foot. Electronic Signature(s) Signed: 10/07/2020 7:58:59 AM By: Sandre Kitty Signed: 10/07/2020 5:44:40 PM By: [name] Hammock RN Entered By: Sandre Kitty on 10/04/2020 16:43:15 -------------------------------------------------------------------------------- Vitals Details Patient Name: Date of Service: [name] [name] 10/04/2020 8:30 A M Medical Record Number: [id number] Patient Account Number: [id number] Date of Birth/Sex: Treating RN: 1946-01-14 (75 y.o. [name] [name], [name] [name] Primary Care Miro Balderson: [name] [name] Other Clinician: Referring Jenner Rosier: Treating Niomi Valent/Extender: [name] [name] in Treatment: 1 Vital Signs Time Taken: 08:31 Temperature (F): 97.7 Height (in): 74 Pulse (bpm): 50 Weight (lbs): 238 Respiratory Rate (breaths/min): 17 Body Mass Index (BMI): 30.6 Blood Pressure (mmHg): 108/55 Reference Range: 80 - 120 mg / dl Electronic Signature(s) Signed: 10/07/2020 5:44:40 PM By: [name] Hammock RN Entered By: [name] [name] on 10/04/2020 08:32:11

## 2020-10-07 LAB — AEROBIC CULTURE W GRAM STAIN (SUPERFICIAL SPECIMEN): Culture: NORMAL

## 2020-10-07 NOTE — Telephone Encounter (Signed)
   Patient Name: [name] [name]  DOB: 05-16-1946  MRN: [id number]   Primary Cardiologist: [name], MD  Chart reviewed as part of pre-operative protocol coverage. Given past medical history and time since last visit, based on ACC/AHA guidelines, [name] [name] would be at acceptable risk for the planned procedure without further cardiovascular testing. Patient was last seen 09/20/20 and was overall stable from a cardiac perspective. I spoke to the patient today, and he denied any changes in s/s. No anginal symptoms. He has chronic lightheadedness that is unchanged. At the last visit, Dr, Debara Pickett noted his upcoming urology procedure, and said it was ok to hold Eliquis 2 days prior to procedure. Recent CBC showed stable platelet count. Patient made of aware of recommendations.   The patient was advised that if he develops new symptoms prior to surgery to contact our office to arrange for a follow-up visit, and he verbalized understanding.  I will route this recommendation to the requesting party via Epic fax function and remove from pre-op pool.  Please call with questions.  Georgie Eduardo Ninfa Meeker, PA-C 10/07/2020, 11:53 AM

## 2020-10-11 ENCOUNTER — Other Ambulatory Visit: Payer: Self-pay

## 2020-10-11 ENCOUNTER — Ambulatory Visit (HOSPITAL_COMMUNITY)
Admission: RE | Admit: 2020-10-11 | Discharge: 2020-10-11 | Disposition: A | Discharge status: Home / Self Care | Payer: Medicare Other | Source: Ambulatory Visit | Attending: Internal Medicine | Admitting: Internal Medicine

## 2020-10-11 ENCOUNTER — Other Ambulatory Visit (HOSPITAL_COMMUNITY): Payer: Self-pay | Admitting: Internal Medicine

## 2020-10-11 ENCOUNTER — Encounter (HOSPITAL_BASED_OUTPATIENT_CLINIC_OR_DEPARTMENT_OTHER): Payer: Medicare Other | Admitting: Internal Medicine

## 2020-10-11 DIAGNOSIS — E1169 Type 2 diabetes mellitus with other specified complication: Secondary | ICD-10-CM | POA: Insufficient documentation

## 2020-10-11 DIAGNOSIS — L97509 Non-pressure chronic ulcer of other part of unspecified foot with unspecified severity: Secondary | ICD-10-CM | POA: Diagnosis not present

## 2020-10-11 DIAGNOSIS — L97522 Non-pressure chronic ulcer of other part of left foot with fat layer exposed: Secondary | ICD-10-CM | POA: Diagnosis not present

## 2020-10-11 DIAGNOSIS — E11621 Type 2 diabetes mellitus with foot ulcer: Secondary | ICD-10-CM | POA: Diagnosis not present

## 2020-10-11 DIAGNOSIS — M869 Osteomyelitis, unspecified: Secondary | ICD-10-CM | POA: Insufficient documentation

## 2020-10-11 DIAGNOSIS — L97529 Non-pressure chronic ulcer of other part of left foot with unspecified severity: Secondary | ICD-10-CM | POA: Diagnosis not present

## 2020-10-11 NOTE — Progress Notes (Signed)
[name] [name] ([id number]) Visit Report for 10/11/2020 HPI Details Patient Name: Date of Service: [name] [name], [name] [name] 10/11/2020 8:30 A M Medical Record Number: [id number] Patient Account Number: [id number] Date of Birth/Sex: Treating RN: Dec 26, 1945 (75 y.o. [name] [name] Primary Care Provider: Consuello [name] Other Clinician: Referring Provider: Treating Provider/Extender: [name] [name] in Treatment: 2 History of Present Illness HPI Description: ADMISSION 09/27/2020 This is a 75 year old man who lives in [place]. He apparently has had callus over the plantar fifth metatarsal head in the past for which she is followed by podiatry in [place]. They shaved down the callus and this was done in January. He states that he went to [place] in January and became aware of when he was there of an open wound in this area. He followed up with podiatry and has been soaking this twice a day with Epson salts and using Silvadene. Apparently one of the podiatrist told him he may need surgery because of bone protrusion. Past medical history includes an ischemic cardiomyopathy, type 2 diabetes with peripheral neuropathy, BPH, PVD, orthostatic hypotension, atrial fibrillation, hyperlipidemia and hypertension Arterial studies in 2018 showed a noncompressible ABI on the left. It was again noncompressible today at 1.7 although his pulses are easily palpable 4/8; patient I admitted to the clinic last week. Wound on the plantar left fifth metatarsal head which has been refractory. He also has a bit of subluxation of the bone in the head. He is a type II diabetic with peripheral neuropathy. We used silver collagen after debridement He arrives back in clinic today. He has erythema spreading into the lateral part of the fifth metatarsal head. I removed some undermining tissue from around the wound and clearly there is a connection between the wound and the lateral part of the fifth met head. A  culture was done. Area of erythema on the lateral part of the foot was marked. His wife says that has been there since Wednesday 4/15; the patient arrived last week with erythema spreading in the lateral part of the fifth metatarsal head. There was a wound connected with the plantar fifth met head original wound. I gave him empiric Augmentin. Surprisingly the culture I did was negative. We have been using silver alginate. The wound has been open since January. He arrives in clinic today with the erythema much better Electronic Signature(s) Signed: 10/11/2020 4:57:27 PM By: [name] Ham MD Entered By: [name] [name] on 10/11/2020 09:04:43 -------------------------------------------------------------------------------- Physical Exam Details Patient Name: Date of Service: [name] [name] 10/11/2020 8:30 A M Medical Record Number: [id number] Patient Account Number: [id number] Date of Birth/Sex: Treating RN: 07-18-1945 (75 y.o. [name] [name] Primary Care Provider: Consuello [name] Other Clinician: Referring Provider: Treating Provider/Extender: [name] [name] in Treatment: 2 Constitutional Sitting or standing Blood Pressure is within target range for patient.. Pulse regular and within target range for patient.Marland Kitchen Respirations regular, non-labored and within target range.. Temperature is normal and within the target range for the patient.Marland Kitchen Appears in no distress. Cardiovascular Pedal pulses are palpable. Notes Wound exam; there is a wound now extending from the fifth met head to the lateral part of the fifth met head on the left. Tissue looks healthy there is less erythema. He does have bone subluxation in this area. He is a type II diabetic with peripheral neuropathy. Electronic Signature(s) Signed: 10/11/2020 4:57:27 PM By: [name] Ham MD Entered By: [name] [name] on 10/11/2020  09:05:31 -------------------------------------------------------------------------------- Physician Orders Details Patient Name: Date of  Service: [name], [name] 10/11/2020 8:30 A M Medical Record Number: [id number] Patient Account Number: [id number] Date of Birth/Sex: Treating RN: 04-08-1946 (75 y.o. [name] [name] Primary Care Provider: Consuello [name] Other Clinician: Referring Provider: Treating Provider/Extender: [name] [name] in Treatment: 2 Verbal / Phone Orders: No Diagnosis Coding Follow-up Appointments Return Appointment in 1 week. Bathing/ Shower/ Hygiene May shower with protection but do not get wound dressing(s) wet. - On no dressing change days May shower and wash wound with soap and water. - on days changing dressing. Do not soak foot. Edema Control - Lymphedema / SCD / Other Elevate legs to the level of the heart or above for 30 minutes daily and/or when sitting, a frequency of: Avoid standing for long periods of time. Off-Loading Open toe surgical shoe to: - With Insert-Reduce pressure and friction to area as much as possible. Additional Orders / Instructions Follow Nutritious Diet Wound Treatment Wound #1 - Metatarsal head fifth Wound Laterality: Plantar, Left Cleanser: Soap and Water Every Other Day/15 Days Discharge Instructions: May shower and wash wound with dial antibacterial soap and water prior to dressing change. Prim Dressing: KerraCel Ag Gelling Fiber Dressing, 2x2 in (silver alginate) (Generic) Every Other Day/15 Days ary Discharge Instructions: Apply silver alginate to wound bed as instructed Secondary Dressing: Woven Gauze Sponges 2x2 in Every Other Day/15 Days Discharge Instructions: Apply over primary dressing as directed. Secondary Dressing: Optifoam Non-Adhesive Dressing, 4x4 in Every Other Day/15 Days Discharge Instructions: Apply foam donut over primary dressing Secured With: Kerlix Roll Sterile, 4.5x3.1 (in/yd) Every  Other Day/15 Days Discharge Instructions: Secure with Kerlix as directed. Secured With: Transpore Surgical Tape, 2x10 (in/yd) Every Other Day/15 Days Discharge Instructions: Secure dressing with tape as directed. Radiology X-ray, foot-Left - [id number] [id number] Electronic Signature(s) Signed: 10/11/2020 4:57:27 PM By: [name] Ham MD Signed: 10/11/2020 5:21:53 PM By: Lorrin Jackson Entered By: Lorrin Jackson on 10/11/2020 09:05:16 Prescription 10/11/2020 -------------------------------------------------------------------------------- Leta Speller MD Patient Name: Provider: 1945/10/22 [id number] Date of Birth: NPI#: Jerilynn Mages [id number] Sex: DEA #: [phone] [id number] Phone #: License #: [place] Patient Address: [address] [place], [place] [address] [place], [place] [address] [phone] Allergies No Known Allergies Provider's Orders X-ray, foot-Left - [id number] Hand Signature: Date(s): Electronic Signature(s) Signed: 10/11/2020 4:57:27 PM By: [name] Ham MD Signed: 10/11/2020 5:21:53 PM By: Lorrin Jackson Entered By: Lorrin Jackson on 10/11/2020 09:05:17 -------------------------------------------------------------------------------- Problem List Details Patient Name: Date of Service: [name] [name] 10/11/2020 8:30 A M Medical Record Number: [id number] Patient Account Number: [id number] Date of Birth/Sex: Treating RN: 1946/02/10 (75 y.o. [name] [name] Primary Care Provider: Consuello [name] Other Clinician: Referring Provider: Treating Provider/Extender: [name] [name] in Treatment: 2 Active Problems ICD-10 Encounter Code Description Active Date MDM Diagnosis E11.621 Type 2 diabetes mellitus with foot ulcer 09/27/2020 No Yes L97.528 Non-pressure chronic ulcer of other part of left foot with other specified 09/27/2020 No Yes severity E11.42  Type 2 diabetes mellitus with diabetic polyneuropathy 09/27/2020 No Yes L03.116 Cellulitis of left lower limb 10/04/2020 No Yes Inactive Problems Resolved Problems Electronic Signature(s) Signed: 10/11/2020 4:57:27 PM By: [name] Ham MD Entered By: [name] [name] on 10/11/2020 09:02:58 -------------------------------------------------------------------------------- Progress Note Details Patient Name: Date of Service: [name] [name] 10/11/2020 8:30 A M Medical Record Number: [id number] Patient Account Number: [id number] Date of Birth/Sex: Treating RN: 1945-08-29 (74 y.o. [name] [name] Primary Care Provider: Consuello [name]  Other Clinician: Referring Provider: Treating Provider/Extender: [name] [name] in Treatment: 2 Subjective History of Present Illness (HPI) ADMISSION 09/27/2020 This is a 74 year old man who lives in [place]. He apparently has had callus over the plantar fifth metatarsal head in the past for which she is followed by podiatry in [place]. They shaved down the callus and this was done in January. He states that he went to [place] in January and became aware of when he was there of an open wound in this area. He followed up with podiatry and has been soaking this twice a day with Epson salts and using Silvadene. Apparently one of the podiatrist told him he may need surgery because of bone protrusion. Past medical history includes an ischemic cardiomyopathy, type 2 diabetes with peripheral neuropathy, BPH, PVD, orthostatic hypotension, atrial fibrillation, hyperlipidemia and hypertension Arterial studies in 2018 showed a noncompressible ABI on the left. It was again noncompressible today at 1.7 although his pulses are easily palpable 4/8; patient I admitted to the clinic last week. Wound on the plantar left fifth metatarsal head which has been refractory. He also has a bit of subluxation of the bone in the head. He is a type II diabetic with  peripheral neuropathy. We used silver collagen after debridement He arrives back in clinic today. He has erythema spreading into the lateral part of the fifth metatarsal head. I removed some undermining tissue from around the wound and clearly there is a connection between the wound and the lateral part of the fifth met head. A culture was done. Area of erythema on the lateral part of the foot was marked. His wife says that has been there since Wednesday 4/15; the patient arrived last week with erythema spreading in the lateral part of the fifth metatarsal head. There was a wound connected with the plantar fifth met head original wound. I gave him empiric Augmentin. Surprisingly the culture I did was negative. We have been using silver alginate. The wound has been open since January. He arrives in clinic today with the erythema much better Objective Constitutional Sitting or standing Blood Pressure is within target range for patient.. Pulse regular and within target range for patient.Marland Kitchen Respirations regular, non-labored and within target range.. Temperature is normal and within the target range for the patient.Marland Kitchen Appears in no distress. Vitals Time Taken: 8:33 AM, Height: 74 in, Source: Stated, Weight: 238 lbs, Source: Stated, BMI: 30.6, Temperature: 97.5 F, Pulse: 67 bpm, Respiratory Rate: 18 breaths/min, Blood Pressure: 100/62 mmHg, Capillary Blood Glucose: 141 mg/dl. General Notes: glucose per pt report yesterday Cardiovascular Pedal pulses are palpable. General Notes: Wound exam; there is a wound now extending from the fifth met head to the lateral part of the fifth met head on the left. Tissue looks healthy there is less erythema. He does have bone subluxation in this area. He is a type II diabetic with peripheral neuropathy. Integumentary (Hair, Skin) Wound #1 status is Open. Original cause of wound was Gradually Appeared. The date acquired was: 06/29/2020. The wound has been in treatment 2  weeks. The wound is located on the Left,Plantar Metatarsal head fifth. The wound measures 1.8cm length x 2.1cm width x 0.1cm depth; 2.969cm^2 area and 0.297cm^3 volume. There is Fat Layer (Subcutaneous Tissue) exposed. There is no tunneling or undermining noted. There is a medium amount of serosanguineous drainage noted. The wound margin is distinct with the outline attached to the wound base. There is large (67-100%) red, pink granulation within the  wound bed. There is a small (1-33%) amount of necrotic tissue within the wound bed including Adherent Slough. Assessment Active Problems ICD-10 Type 2 diabetes mellitus with foot ulcer Non-pressure chronic ulcer of other part of left foot with other specified severity Type 2 diabetes mellitus with diabetic polyneuropathy Cellulitis of left lower limb Plan Follow-up Appointments: Return Appointment in 1 week. Bathing/ Shower/ Hygiene: May shower with protection but do not get wound dressing(s) wet. - On no dressing change days May shower and wash wound with soap and water. - on days changing dressing. Do not soak foot. Edema Control - Lymphedema / SCD / Other: Elevate legs to the level of the heart or above for 30 minutes daily and/or when sitting, a frequency of: Avoid standing for long periods of time. Off-Loading: Open toe surgical shoe to: - With Insert-Reduce pressure and friction to area as much as possible. Additional Orders / Instructions: Follow Nutritious Diet Radiology ordered were: X-ray, foot-Left - [id number] WOUND #1: - Metatarsal head fifth Wound Laterality: Plantar, Left Cleanser: Soap and Water Every Other Day/15 Days Discharge Instructions: May shower and wash wound with dial antibacterial soap and water prior to dressing change. Prim Dressing: KerraCel Ag Gelling Fiber Dressing, 2x2 in (silver alginate) (Generic) Every Other Day/15 Days ary Discharge Instructions: Apply silver alginate to wound bed as  instructed Secondary Dressing: Woven Gauze Sponges 2x2 in Every Other Day/15 Days Discharge Instructions: Apply over primary dressing as directed. Secondary Dressing: Optifoam Non-Adhesive Dressing, 4x4 in Every Other Day/15 Days Discharge Instructions: Apply foam donut over primary dressing Secured With: Kerlix Roll Sterile, 4.5x3.1 (in/yd) Every Other Day/15 Days Discharge Instructions: Secure with Kerlix as directed. Secured With: Transpore Surgical T ape, 2x10 (in/yd) Every Other Day/15 Days Discharge Instructions: Secure dressing with tape as directed. #1 I am continuing with silver alginate 2. Plain x-ray of the area 3. In spite of the negative culture the Augmentin seems to have helped. There is less erythema. The only other explanation I have is that this slightly subluxed fifth metatarsal head is rubbing against the base of his surgical shoe in spite of the fact that he has a thick insole and a cut out area. 4. If the plain x-ray is negative I have told him to prepare for a total contact cast Electronic Signature(s) Signed: 10/11/2020 4:57:27 PM By: [name] Ham MD Entered By: [name] [name] on 10/11/2020 09:06:34 -------------------------------------------------------------------------------- SuperBill Details Patient Name: Date of Service: [name] [name] 10/11/2020 Medical Record Number: [id number] Patient Account Number: [id number] Date of Birth/Sex: Treating RN: [birth year] (74 y.o. [name] [name] Primary Care Provider: Consuello [name] Other Clinician: Referring Provider: Treating Provider/Extender: [name] [name] in Treatment: 2 Diagnosis Coding ICD-10 Codes Code Description [phone] Type 2 diabetes mellitus with foot ulcer L97.528 Non-pressure chronic ulcer of other part of left foot with other specified severity E11.42 Type 2 diabetes mellitus with diabetic polyneuropathy Facility Procedures CPT4 Code: [id number] Description: 99213 - WOUND  CARE VISIT-LEV 3 EST PT Modifier: Quantity: 1 Physician Procedures : CPT4 Code Description Modifier [id number] - WC PHYS LEVEL 3 - EST PT ICD-10 Diagnosis Description E11.621 Type 2 diabetes mellitus with foot ulcer L97.528 Non-pressure chronic ulcer of other part of left foot with other specified severity E11.42  Type 2 diabetes mellitus with diabetic polyneuropathy Quantity: 1 Electronic Signature(s) Signed: 10/11/2020 4:57:27 PM By: [name] Ham MD Entered By: [name] [name] on 10/11/2020 09:06:58

## 2020-10-14 ENCOUNTER — Encounter: Payer: Self-pay | Admitting: Urology

## 2020-10-16 NOTE — Progress Notes (Signed)
[name] [name], [name] [name] ([id number]) Visit Report for 10/11/2020 Arrival Information Details Patient Name: Date of Service: [name] [name], [name] [name] 10/11/2020 8:30 A M Medical Record Number: [id number] Patient Account Number: [id number] Date of Birth/Sex: Treating RN: [birth year] (75 y.o. Ernestene Mention Primary Care Delonta Yohannes: Consuello Masse Other Clinician: Referring Shannie Kontos: Treating Citlali Gautney/Extender: Zadie Cleverly in Treatment: 2 Visit Information History Since Last Visit Added or deleted any medications: No Patient Arrived: Ambulatory Any new allergies or adverse reactions: No Arrival Time: 08:28 Had a fall or experienced change in No Accompanied By: dgt activities of daily living that may affect Transfer Assistance: None risk of falls: Patient Identification Verified: Yes Signs or symptoms of abuse/neglect since No Secondary Verification Process Completed: Yes last visito Patient Requires Transmission-Based Precautions: No Hospitalized since last visit: No Patient Has Alerts: No Implantable device outside of the clinic No excluding cellular tissue based products placed in the center since last visit: Has Dressing in Place as Prescribed: Yes Has Footwear/Offloading in Place as Yes Prescribed: Left: Surgical Shoe with Pressure Relief Insole Pain Present Now: Yes Electronic Signature(s) Signed: 10/16/2020 6:20:01 PM By: Baruch Gouty RN, BSN Entered By: Baruch Gouty on 10/11/2020 08:33:20 -------------------------------------------------------------------------------- Clinic Level of Care Assessment Details Patient Name: Date of Service: [name] [name], [name] [name] 10/11/2020 8:30 A M Medical Record Number: [id number] Patient Account Number: [id number] Date of Birth/Sex: Treating RN: [birth year] (75 y.o. [name] Primary Care Promise Weldin: Consuello Masse Other Clinician: Referring Blayke Pinera: Treating Wilberth Damon/Extender: Zadie Cleverly in  Treatment: 2 Clinic Level of Care Assessment Items TOOL 4 Quantity Score X- 1 0 Use when only an EandM is performed on FOLLOW-UP visit ASSESSMENTS - Nursing Assessment / Reassessment X- 1 10 Reassessment of Co-morbidities (includes updates in patient status) X- 1 5 Reassessment of Adherence to Treatment Plan ASSESSMENTS - Wound and Skin A ssessment / Reassessment X - Simple Wound Assessment / Reassessment - one wound 1 5 []  - 0 Complex Wound Assessment / Reassessment - multiple wounds []  - 0 Dermatologic / Skin Assessment (not related to wound area) ASSESSMENTS - Focused Assessment []  - 0 Circumferential Edema Measurements - multi extremities []  - 0 Nutritional Assessment / Counseling / Intervention []  - 0 Lower Extremity Assessment (monofilament, tuning fork, pulses) []  - 0 Peripheral Arterial Disease Assessment (using hand held doppler) ASSESSMENTS - Ostomy and/or Continence Assessment and Care []  - 0 Incontinence Assessment and Management []  - 0 Ostomy Care Assessment and Management (repouching, etc.) PROCESS - Coordination of Care []  - 0 Simple Patient / Family Education for ongoing care X- 1 20 Complex (extensive) Patient / Family Education for ongoing care X- 1 10 Staff obtains Programmer, systems, Records, T Results / Process Orders est []  - 0 Staff telephones HHA, Nursing Homes / Clarify orders / etc []  - 0 Routine Transfer to another Facility (non-emergent condition) []  - 0 Routine Hospital Admission (non-emergent condition) []  - 0 New Admissions / Biomedical engineer / Ordering NPWT Apligraf, etc. , []  - 0 Emergency Hospital Admission (emergent condition) []  - 0 Simple Discharge Coordination []  - 0 Complex (extensive) Discharge Coordination PROCESS - Special Needs []  - 0 Pediatric / Minor Patient Management []  - 0 Isolation Patient Management []  - 0 Hearing / Language / Visual special needs []  - 0 Assessment of Community assistance (transportation, D/C  planning, etc.) []  - 0 Additional assistance / Altered mentation []  - 0 Support Surface(s) Assessment (bed, cushion, seat, etc.) INTERVENTIONS - Wound Cleansing / Measurement X - Simple Wound  Cleansing - one wound 1 5 []  - 0 Complex Wound Cleansing - multiple wounds X- 1 5 Wound Imaging (photographs - any number of wounds) []  - 0 Wound Tracing (instead of photographs) X- 1 5 Simple Wound Measurement - one wound []  - 0 Complex Wound Measurement - multiple wounds INTERVENTIONS - Wound Dressings []  - 0 Small Wound Dressing one or multiple wounds X- 1 15 Medium Wound Dressing one or multiple wounds []  - 0 Large Wound Dressing one or multiple wounds X- 1 5 Application of Medications - topical []  - 0 Application of Medications - injection INTERVENTIONS - Miscellaneous []  - 0 External ear exam []  - 0 Specimen Collection (cultures, biopsies, blood, body fluids, etc.) []  - 0 Specimen(s) / Culture(s) sent or taken to Lab for analysis []  - 0 Patient Transfer (multiple staff / Civil Service fast streamer / Similar devices) []  - 0 Simple Staple / Suture removal (25 or less) []  - 0 Complex Staple / Suture removal (26 or more) []  - 0 Hypo / Hyperglycemic Management (close monitor of Blood Glucose) []  - 0 Ankle / Brachial Index (ABI) - do not check if billed separately X- 1 5 Vital Signs Has the patient been seen at the hospital within the last three years: Yes Total Score: 90 Level Of Care: New/Established - Level 3 Electronic Signature(s) Signed: 10/11/2020 5:21:53 PM By: Lorrin Jackson Entered By: Lorrin Jackson on 10/11/2020 08:57:58 -------------------------------------------------------------------------------- Encounter Discharge Information Details Patient Name: Date of Service: [name] [name] 10/11/2020 8:30 A M Medical Record Number: [id number] Patient Account Number: [id number] Date of Birth/Sex: Treating RN: 1946/04/02 (75 y.o. [name] Primary Care Quintan Saldivar: Consuello Masse Other Clinician: Referring Deforrest Bogle: Treating Faiga Stones/Extender: Zadie Cleverly in Treatment: 2 Encounter Discharge Information Items Discharge Condition: Stable Ambulatory Status: Ambulatory Discharge Destination: Home Transportation: Private Auto Accompanied By: family member Schedule Follow-up Appointment: Yes Clinical Summary of Care: Electronic Signature(s) Signed: 10/11/2020 5:54:45 PM By: Deon Pilling Entered By: Deon Pilling on 10/11/2020 11:06:49 -------------------------------------------------------------------------------- Lower Extremity Assessment Details Patient Name: Date of Service: [name] [name], [name] [name] 10/11/2020 8:30 A M Medical Record Number: [id number] Patient Account Number: [id number] Date of Birth/Sex: Treating RN: Jun 03, 1946 (75 y.o. Ernestene Mention Primary Care Tekela Garguilo: Consuello Masse Other Clinician: Referring Silver Achey: Treating Reeve Mallo/Extender: Zadie Cleverly in Treatment: 2 Edema Assessment Assessed: Shirlyn Goltz: No] Patrice Paradise: No] Edema: [Left: Ye] [Right: s] Calf Left: Right: Point of Measurement: 40 cm From Medial Instep 37 cm Ankle Left: Right: Point of Measurement: 10 cm From Medial Instep 23.3 cm Vascular Assessment Pulses: Dorsalis Pedis Palpable: [Left:No] Electronic Signature(s) Signed: 10/16/2020 6:20:01 PM By: Baruch Gouty RN, BSN Entered By: Baruch Gouty on 10/11/2020 08:39:16 -------------------------------------------------------------------------------- Multi Wound Chart Details Patient Name: Date of Service: [name] [name] 10/11/2020 8:30 A M Medical Record Number: [id number] Patient Account Number: [id number] Date of Birth/Sex: Treating RN: 31-May-1946 (74 y.o. [name] Primary Care Shine Mikes: Consuello Masse Other Clinician: Referring Truxton Stupka: Treating Valita Righter/Extender: Zadie Cleverly in Treatment: 2 Vital Signs Height(in): 74 Capillary Blood  Glucose(mg/dl): 141 Weight(lbs): 238 Pulse(bpm): 5 Body Mass Index(BMI): 31 Blood Pressure(mmHg): 100/62 Temperature(F): 97.5 Respiratory Rate(breaths/min): 18 Photos: [1:No Photos Left, Plantar Metatarsal head fifth N/A] [N/A:N/A] Wound Location: [1:Gradually Appeared] [N/A:N/A] Wounding Event: [1:Diabetic Wound/Ulcer of the Lower] [N/A:N/A] Primary Etiology: [1:Extremity Sleep Apnea, Arrhythmia, Congestive N/A] Comorbid History: [1:Heart Failure, Coronary Artery Disease, Type II Diabetes, Osteoarthritis, Neuropathy 06/29/2020] [N/A:N/A] Date Acquired: [1:2] [N/A:N/A] Weeks of Treatment: [1:Open] [N/A:N/A] Wound Status: [1:1.8x2.1x0.1] [  N/A:N/A] Measurements L x W x D (cm) [1:2.969] [N/A:N/A] A (cm) : rea [1:0.297] [N/A:N/A] Volume (cm) : [1:-4081.70%] [N/A:N/A] % Reduction in A rea: [1:-1314.30%] [N/A:N/A] % Reduction in Volume: [1:Grade 2] [N/A:N/A] Classification: [1:Medium] [N/A:N/A] Exudate A mount: [1:Serosanguineous] [N/A:N/A] Exudate Type: [1:red, brown] [N/A:N/A] Exudate Color: [1:Distinct, outline attached] [N/A:N/A] Wound Margin: [1:Large (67-100%)] [N/A:N/A] Granulation A mount: [1:Red, Pink] [N/A:N/A] Granulation Quality: [1:Small (1-33%)] [N/A:N/A] Necrotic A mount: [1:Fat Layer (Subcutaneous Tissue): Yes N/A] Exposed Structures: [1:Fascia: No Tendon: No Muscle: No Joint: No Bone: No None] [N/A:N/A] Treatment Notes Electronic Signature(s) Signed: 10/11/2020 4:57:27 PM By: Linton Ham MD Signed: 10/11/2020 5:21:53 PM By: Lorrin Jackson Entered By: Linton Ham on 10/11/2020 09:03:13 -------------------------------------------------------------------------------- Multi-Disciplinary Care Plan Details Patient Name: Date of Service: [name] [name] 10/11/2020 8:30 A M Medical Record Number: [id number] Patient Account Number: [id number] Date of Birth/Sex: Treating RN: [birth year] (74 y.o. [name] Primary Care Danne Vasek: Consuello Masse Other  Clinician: Referring Jalayia Bagheri: Treating Eryca Bolte/Extender: Zadie Cleverly in Treatment: 2 Active Inactive Nutrition Nursing Diagnoses: Impaired glucose control: actual or potential Goals: Patient/caregiver verbalizes understanding of need to maintain therapeutic glucose control per primary care physician Date Initiated: 09/27/2020 Target Resolution Date: 10/25/2020 Goal Status: Active Interventions: Assess HgA1c results as ordered upon admission and as needed Assess patient nutrition upon admission and as needed per policy Provide education on elevated blood sugars and impact on wound healing Provide education on nutrition Notes: Orientation to the Wound Care Program Nursing Diagnoses: Knowledge deficit related to the wound healing center program Goals: Patient/caregiver will verbalize understanding of the [facility] Date Initiated: 09/27/2020 Target Resolution Date: 10/25/2020 Goal Status: Active Interventions: Provide education on orientation to the wound center Notes: Wound/Skin Impairment Nursing Diagnoses: Impaired tissue integrity Goals: Patient/caregiver will verbalize understanding of skin care regimen Date Initiated: 09/27/2020 Target Resolution Date: 10/25/2020 Goal Status: Active Ulcer/skin breakdown will have a volume reduction of 30% by week 4 Date Initiated: 09/27/2020 Target Resolution Date: 10/25/2020 Goal Status: Active Interventions: Assess patient/caregiver ability to obtain necessary supplies Assess patient/caregiver ability to perform ulcer/skin care regimen upon admission and as needed Assess ulceration(s) every visit Provide education on ulcer and skin care Treatment Activities: Skin care regimen initiated : 09/27/2020 Topical wound management initiated : 09/27/2020 Notes: Electronic Signature(s) Signed: 10/11/2020 5:21:53 PM By: Lorrin Jackson Entered By: Lorrin Jackson on 10/11/2020  08:53:14 -------------------------------------------------------------------------------- Pain Assessment Details Patient Name: Date of Service: [name] [name] 10/11/2020 8:30 A M Medical Record Number: [id number] Patient Account Number: [id number] Date of Birth/Sex: Treating RN: 13-Mar-1946 (75 y.o. Ernestene Mention Primary Care [place] Severs: Consuello Masse Other Clinician: Referring Leyana Whidden: Treating Tymara Saur/Extender: Zadie Cleverly in Treatment: 2 Active Problems Location of Pain Severity and Description of Pain Patient Has Paino No Site Locations Duration of the Pain. Constant / Intermittento Intermittent Rate the pain. Current Pain Level: 0 Worst Pain Level: 2 Least Pain Level: 0 Pain Management and Medication Current Pain Management: Other: time Is the Current Pain Management Adequate: Adequate How does your wound impact your activities of daily livingo Sleep: No Bathing: No Appetite: No Relationship With Others: No Bladder Continence: No Emotions: No Bowel Continence: No Work: No Toileting: No Drive: No Dressing: No Hobbies: No Engineer, maintenance) Signed: 10/16/2020 6:20:01 PM By: Baruch Gouty RN, BSN Entered By: Baruch Gouty on 10/11/2020 08:35:12 -------------------------------------------------------------------------------- Patient/Caregiver Education Details Patient Name: Date of Service: [name] [name], [name] RRY G. 4/15/2022andnbsp8:30 [facility] Record Number: [id number] Patient Account Number: [id number] Date  of Birth/Gender: Treating RN: 10-31-1945 (75 y.o. [name] Primary Care Physician: Consuello Masse Other Clinician: Referring Physician: Treating Physician/Extender: Zadie Cleverly in Treatment: 2 Education Assessment Education Provided To: Patient Education Topics Provided Elevated Blood Sugar/ Impact on Healing: Methods: Explain/Verbal, Printed Responses: State content  correctly [place]: o Methods: Explain/Verbal, Printed Responses: State content correctly Wound/Skin Impairment: Methods: Explain/Verbal, Printed Responses: State content correctly Electronic Signature(s) Signed: 10/11/2020 5:21:53 PM By: Lorrin Jackson Entered By: Lorrin Jackson on 10/11/2020 08:53:38 -------------------------------------------------------------------------------- Wound Assessment Details Patient Name: Date of Service: [name] [name] 10/11/2020 8:30 A M Medical Record Number: [id number] Patient Account Number: [id number] Date of Birth/Sex: Treating RN: 07-Sep-1945 (74 y.o. Ernestene Mention Primary Care Christa Fasig: Consuello Masse Other Clinician: Referring Berk Pilot: Treating Lorrin Nawrot/Extender: Zadie Cleverly in Treatment: 2 Wound Status Wound Number: 1 Primary Diabetic Wound/Ulcer of the Lower Extremity Etiology: Wound Location: Left, Plantar Metatarsal head fifth Wound Open Wounding Event: Gradually Appeared Status: Date Acquired: 06/29/2020 Comorbid Sleep Apnea, Arrhythmia, Congestive Heart Failure, Coronary Weeks Of Treatment: 2 History: Artery Disease, Type II Diabetes, Osteoarthritis, Neuropathy Clustered Wound: No Photos Wound Measurements Length: (cm) 1.8 Width: (cm) 2.1 Depth: (cm) 0.1 Area: (cm) 2.969 Volume: (cm) 0.297 % Reduction in Area: -4081.7% % Reduction in Volume: -1314.3% Epithelialization: None Tunneling: No Undermining: No Wound Description Classification: Grade 2 Wound Margin: Distinct, outline attached Exudate Amount: Medium Exudate Type: Serosanguineous Exudate Color: red, brown Foul Odor After Cleansing: No Slough/Fibrino No Wound Bed Granulation Amount: Large (67-100%) Exposed Structure Granulation Quality: Red, Pink Fascia Exposed: No Necrotic Amount: Small (1-33%) Fat Layer (Subcutaneous Tissue) Exposed: Yes Necrotic Quality: Adherent Slough Tendon Exposed: No Muscle  Exposed: No Joint Exposed: No Bone Exposed: No Treatment Notes Wound #1 (Metatarsal head fifth) Wound Laterality: Plantar, Left Cleanser Soap and Water Discharge Instruction: May shower and wash wound with dial antibacterial soap and water prior to dressing change. Peri-Wound Care Topical Primary Dressing KerraCel Ag Gelling Fiber Dressing, 2x2 in (silver alginate) Discharge Instruction: Apply silver alginate to wound bed as instructed Secondary Dressing Woven Gauze Sponges 2x2 in Discharge Instruction: Apply over primary dressing as directed. Optifoam Non-Adhesive Dressing, 4x4 in Discharge Instruction: Apply foam donut over primary dressing Secured With Kerlix Roll Sterile, 4.5x3.1 (in/yd) Discharge Instruction: Secure with Kerlix as directed. Transpore Surgical Tape, 2x10 (in/yd) Discharge Instruction: Secure dressing with tape as directed. Compression Wrap Compression Stockings Add-Ons Notes explained to have x-ray performed at any [facility] facility. Patient and family member in agreement. Electronic Signature(s) Signed: 10/15/2020 8:07:32 AM By: Sandre Kitty Signed: 10/16/2020 6:20:01 PM By: Baruch Gouty RN, BSN Entered By: Sandre Kitty on 10/11/2020 16:35:36 -------------------------------------------------------------------------------- [name] [name] Details Patient Name: Date of Service: [name] [name] 10/11/2020 8:30 A M Medical Record Number: [id number] Patient Account Number: [id number] Date of Birth/Sex: Treating RN: 06-Mar-1946 (74 y.o. Ernestene Mention Primary Care Kensley Lares: Consuello Masse Other Clinician: Referring Jamirra Curnow: Treating Sahith Nurse/Extender: Zadie Cleverly in Treatment: 2 Vital Signs Time Taken: 08:33 Temperature (F): 97.5 Height (in): 74 Pulse (bpm): 67 Source: Stated Respiratory Rate (breaths/min): 18 Weight (lbs): 238 Blood Pressure (mmHg): 100/62 Source: Stated Capillary Blood Glucose (mg/dl): 141 Body  Mass Index (BMI): 30.6 Reference Range: 80 - 120 mg / dl Notes glucose per pt report yesterday Electronic Signature(s) Signed: 10/16/2020 6:20:01 PM By: Baruch Gouty RN, BSN Entered By: Baruch Gouty on 10/11/2020 08:34:38

## 2020-10-17 DIAGNOSIS — E785 Hyperlipidemia, unspecified: Secondary | ICD-10-CM | POA: Diagnosis not present

## 2020-10-17 DIAGNOSIS — I255 Ischemic cardiomyopathy: Secondary | ICD-10-CM | POA: Diagnosis not present

## 2020-10-17 DIAGNOSIS — Z951 Presence of aortocoronary bypass graft: Secondary | ICD-10-CM | POA: Diagnosis not present

## 2020-10-17 DIAGNOSIS — I10 Essential (primary) hypertension: Secondary | ICD-10-CM | POA: Diagnosis not present

## 2020-10-18 ENCOUNTER — Other Ambulatory Visit: Payer: Self-pay

## 2020-10-18 ENCOUNTER — Encounter (HOSPITAL_BASED_OUTPATIENT_CLINIC_OR_DEPARTMENT_OTHER): Payer: Medicare Other | Admitting: Internal Medicine

## 2020-10-18 DIAGNOSIS — E1151 Type 2 diabetes mellitus with diabetic peripheral angiopathy without gangrene: Secondary | ICD-10-CM | POA: Diagnosis not present

## 2020-10-18 DIAGNOSIS — I255 Ischemic cardiomyopathy: Secondary | ICD-10-CM | POA: Diagnosis not present

## 2020-10-18 DIAGNOSIS — E11621 Type 2 diabetes mellitus with foot ulcer: Secondary | ICD-10-CM

## 2020-10-18 DIAGNOSIS — L97528 Non-pressure chronic ulcer of other part of left foot with other specified severity: Secondary | ICD-10-CM

## 2020-10-18 DIAGNOSIS — I779 Disorder of arteries and arterioles, unspecified: Secondary | ICD-10-CM | POA: Diagnosis not present

## 2020-10-18 DIAGNOSIS — L03116 Cellulitis of left lower limb: Secondary | ICD-10-CM | POA: Diagnosis not present

## 2020-10-18 LAB — LIPID PANEL
Chol/HDL Ratio: 5 ratio (ref 0.0–5.0)
Cholesterol, Total: 181 mg/dL (ref 100–199)
HDL: 36 mg/dL — ABNORMAL LOW (ref >39)
LDL Chol Calc (NIH): 127 mg/dL — ABNORMAL HIGH (ref 0–99)
Triglycerides: 97 mg/dL (ref 0–149)
VLDL Cholesterol Cal: 18 mg/dL (ref 5–40)

## 2020-10-18 LAB — BASIC METABOLIC PANEL
BUN/Creatinine Ratio: 17 (ref 10–24)
BUN: 18 mg/dL (ref 8–27)
CO2: 19 mmol/L — ABNORMAL LOW (ref 20–29)
Calcium: 9 mg/dL (ref 8.6–10.2)
Chloride: 103 mmol/L (ref 96–106)
Creatinine, Ser: 1.09 mg/dL (ref 0.76–1.27)
Glucose: 179 mg/dL — ABNORMAL HIGH (ref 65–99)
Potassium: 4.4 mmol/L (ref 3.5–5.2)
Sodium: 142 mmol/L (ref 134–144)
eGFR: 70 mL/min/{1.73_m2} (ref >59)

## 2020-10-18 LAB — HEMOGLOBIN A1C
Est. average glucose Bld gHb Est-mCnc: 154 mg/dL
Hgb A1c MFr Bld: 7 % — ABNORMAL HIGH (ref 4.8–5.6)

## 2020-10-18 NOTE — Progress Notes (Addendum)
COHAN, STIPES ([id number]) Visit Report for 10/18/2020 Arrival Information Details Patient Name: Date of Service: [name] [name], [name] [name] 10/18/2020 8:00 A M Medical Record Number: [id number] Patient Account Number: [id number] Date of Birth/Sex: Treating RN: 08-31-1945 (75 y.o. [name], [name] Primary Care Gearald Stonebraker: Consuello Masse Other Clinician: Referring Perl Kerney: Treating Jaelah Hauth/Extender: Quentin Cornwall in Treatment: 3 Visit Information History Since Last Visit Added or deleted any medications: No Patient Arrived: Ambulatory Any new allergies or adverse reactions: No Arrival Time: 08:40 Had a fall or experienced change in No Accompanied By: daughter activities of daily living that may affect Transfer Assistance: None risk of falls: Patient Identification Verified: Yes Signs or symptoms of abuse/neglect since last visito No Secondary Verification Process Completed: Yes Hospitalized since last visit: No Patient Requires Transmission-Based Precautions: No Implantable device outside of the clinic excluding No Patient Has Alerts: No cellular tissue based products placed in the center since last visit: Has Dressing in Place as Prescribed: Yes Pain Present Now: No Electronic Signature(s) Signed: 11/01/2020 3:14:58 PM By: Rhae Hammock RN Entered By: Rhae Hammock on 10/18/2020 08:44:35 -------------------------------------------------------------------------------- Encounter Discharge Information Details Patient Name: Date of Service: [name] [name] 10/18/2020 8:00 A M Medical Record Number: [id number] Patient Account Number: [id number] Date of Birth/Sex: Treating RN: June 06, 1946 (75 y.o. Ernestene Mention Primary Care Railynn Ballo: Consuello Masse Other Clinician: Referring Carsin Randazzo: Treating Benjimen Kelley/Extender: Quentin Cornwall in Treatment: 3 Encounter Discharge Information Items Post Procedure Vitals Discharge Condition:  Stable Temperature (F): 97.9 Ambulatory Status: Ambulatory Pulse (bpm): 66 Discharge Destination: Home Respiratory Rate (breaths/min): 18 Transportation: Private Auto Blood Pressure (mmHg): 101/60 Accompanied By: daughter Schedule Follow-up Appointment: Yes Clinical Summary of Care: Patient Declined Electronic Signature(s) Signed: 10/18/2020 5:10:31 PM By: Baruch Gouty RN, BSN Entered By: Baruch Gouty on 10/18/2020 09:55:37 -------------------------------------------------------------------------------- Multi Wound Chart Details Patient Name: Date of Service: [name] [name] 10/18/2020 8:00 A M Medical Record Number: [id number] Patient Account Number: [id number] Date of Birth/Sex: Treating RN: 03/17/1946 (75 y.o. [name] Primary Care Kacia Halley: Consuello Masse Other Clinician: Referring Ryann Leavitt: Treating Gus Littler/Extender: Quentin Cornwall in Treatment: 3 Vital Signs Height(in): 74 Capillary Blood Glucose(mg/dl): 158 Weight(lbs): 238 Pulse(bpm): 65 Body Mass Index(BMI): 31 Blood Pressure(mmHg): 101/60 Temperature(F): 97.9 Respiratory Rate(breaths/min): 17 Photos: [1:No Photos Left, Plantar Metatarsal head fifth N/A] [N/A:N/A] Wound Location: [1:Gradually Appeared] [N/A:N/A] Wounding Event: [1:Diabetic Wound/Ulcer of the Lower] [N/A:N/A] Primary Etiology: [1:Extremity Sleep Apnea, Arrhythmia, Congestive N/A] Comorbid History: [1:Heart Failure, Coronary Artery Disease, Type II Diabetes, Osteoarthritis, Neuropathy 06/29/2020] [N/A:N/A] Date Acquired: [1:3] [N/A:N/A] Weeks of Treatment: [1:Open] [N/A:N/A] Wound Status: [1:1.1x1.7x0.2] [N/A:N/A] Measurements L x W x D (cm) [1:1.469] [N/A:N/A] A (cm) : rea [1:0.294] [N/A:N/A] Volume (cm) : [1:-1969.00%] [N/A:N/A] % Reduction in A [1:rea: -1300.00%] [N/A:N/A] % Reduction in Volume: [1:Grade 2] [N/A:N/A] Classification: [1:Medium] [N/A:N/A] Exudate A mount: [1:Serosanguineous]  [N/A:N/A] Exudate Type: [1:red, brown] [N/A:N/A] Exudate Color: [1:Distinct, outline attached] [N/A:N/A] Wound Margin: [1:Large (67-100%)] [N/A:N/A] Granulation A mount: [1:Red, Pink] [N/A:N/A] Granulation Quality: [1:Small (1-33%)] [N/A:N/A] Necrotic A mount: [1:Fat Layer (Subcutaneous Tissue): Yes N/A] Exposed Structures: [1:Fascia: No Tendon: No Muscle: No Joint: No Bone: No Small (1-33%)] [N/A:N/A] Epithelialization: [1:Debridement - Selective/Open Wound N/A] Debridement: Pre-procedure Verification/Time Out 09:14 [N/A:N/A] Taken: [1:Other] [N/A:N/A] Pain Control: [1:Callus] [N/A:N/A] Tissue Debrided: [1:Skin/Dermis] [N/A:N/A] Level: [1:1.87] [N/A:N/A] Debridement A (sq cm): [1:rea Blade, Curette] [N/A:N/A] Instrument: [1:Minimum] [N/A:N/A] Bleeding: [1:Pressure] [N/A:N/A] Hemostasis A chieved: [1:Procedure was tolerated well] [N/A:N/A] Debridement Treatment Response: [1:1.1x1.7x0.2] [N/A:N/A] Post Debridement Measurements L  x W x D (cm) [1:0.294] [N/A:N/A] Post Debridement Volume: (cm) [1:Debridement] [N/A:N/A] Treatment Notes Wound #1 (Metatarsal head fifth) Wound Laterality: Plantar, Left Cleanser Soap and Water Discharge Instruction: May shower and wash wound with dial antibacterial soap and water prior to dressing change. Peri-Wound Care Topical Primary Dressing KerraCel Ag Gelling Fiber Dressing, 2x2 in (silver alginate) Discharge Instruction: Apply silver alginate to wound bed as instructed Secondary Dressing Woven Gauze Sponges 2x2 in Discharge Instruction: Apply over primary dressing as directed. Optifoam Non-Adhesive Dressing, 4x4 in Discharge Instruction: Apply foam donut over primary dressing Secured With Kerlix Roll Sterile, 4.5x3.1 (in/yd) Discharge Instruction: Secure with Kerlix as directed. Transpore Surgical Tape, 2x10 (in/yd) Discharge Instruction: Secure dressing with tape as directed. Compression Wrap Compression Stockings Add-Ons Electronic  Signature(s) Signed: 10/18/2020 10:39:02 AM By: Kalman Shan DO Signed: 10/18/2020 5:01:44 PM By: Lorrin Jackson Entered By: Kalman Shan on 10/18/2020 10:27:46 -------------------------------------------------------------------------------- Multi-Disciplinary Care Plan Details Patient Name: Date of Service: [name] [name] 10/18/2020 8:00 A M Medical Record Number: [id number] Patient Account Number: [id number] Date of Birth/Sex: Treating RN: 17-Jul-1945 (75 y.o. [name] Primary Care Riley Hallum: Consuello Masse Other Clinician: Referring Lilliona Blakeney: Treating Vernessa Likes/Extender: Quentin Cornwall in Treatment: 3 Active Inactive Nutrition Nursing Diagnoses: Impaired glucose control: actual or potential Goals: Patient/caregiver verbalizes understanding of need to maintain therapeutic glucose control per primary care physician Date Initiated: 09/27/2020 Target Resolution Date: 10/25/2020 Goal Status: Active Interventions: Assess HgA1c results as ordered upon admission and as needed Assess patient nutrition upon admission and as needed per policy Provide education on elevated blood sugars and impact on wound healing Provide education on nutrition Notes: Orientation to the Wound Care Program Nursing Diagnoses: Knowledge deficit related to the wound healing center program Goals: Patient/caregiver will verbalize understanding of the [facility] Program Date Initiated: 09/27/2020 Target Resolution Date: 10/25/2020 Goal Status: Active Interventions: Provide education on orientation to the wound center Notes: Wound/Skin Impairment Nursing Diagnoses: Impaired tissue integrity Goals: Patient/caregiver will verbalize understanding of skin care regimen Date Initiated: 09/27/2020 Target Resolution Date: 10/25/2020 Goal Status: Active Ulcer/skin breakdown will have a volume reduction of 30% by week 4 Date Initiated: 09/27/2020 Target Resolution Date:  10/25/2020 Goal Status: Active Interventions: Assess patient/caregiver ability to obtain necessary supplies Assess patient/caregiver ability to perform ulcer/skin care regimen upon admission and as needed Assess ulceration(s) every visit Provide education on ulcer and skin care Treatment Activities: Skin care regimen initiated : 09/27/2020 Topical wound management initiated : 09/27/2020 Notes: Electronic Signature(s) Signed: 10/18/2020 7:55:04 AM By: Lorrin Jackson Entered By: Lorrin Jackson on 10/18/2020 07:55:03 -------------------------------------------------------------------------------- Pain Assessment Details Patient Name: Date of Service: [name] [name] 10/18/2020 8:00 A M Medical Record Number: [id number] Patient Account Number: [id number] Date of Birth/Sex: Treating RN: Mar 01, 1946 (75 y.o. [name] Primary Care Jaycob Mcclenton: Consuello Masse Other Clinician: Referring Papa Piercefield: Treating Emogene Muratalla/Extender: Quentin Cornwall in Treatment: 3 Active Problems Location of Pain Severity and Description of Pain Patient Has Paino No Site Locations Rate the pain. Rate the pain. Current Pain Level: 0 Pain Management and Medication Current Pain Management: Electronic Signature(s) Signed: 11/01/2020 3:14:58 PM By: Rhae Hammock RN Entered By: Rhae Hammock on 10/18/2020 08:50:12 -------------------------------------------------------------------------------- Patient/Caregiver Education Details Patient Name: Date of Service: Somera, BA RRY G. 4/22/2022andnbsp8:00 [place] Record Number: [id number] Patient Account Number: [id number] Date of Birth/Gender: Treating RN: [birth year] (75 y.o. [name] Primary Care Physician: Consuello Masse Other Clinician: Referring Physician: Treating Physician/Extender: Quentin Cornwall in  Treatment: 3 Education Assessment Education Provided To: Patient Education Topics  Provided Elevated Blood Sugar/ Impact on Healing: Methods: Explain/Verbal, Printed Responses: State content correctly Offloading: Methods: Explain/Verbal, Printed Responses: State content correctly Wound/Skin Impairment: Methods: Explain/Verbal, Printed Responses: State content correctly Electronic Signature(s) Signed: 10/18/2020 5:01:44 PM By: Lorrin Jackson Entered By: Lorrin Jackson on 10/18/2020 07:55:32 -------------------------------------------------------------------------------- Wound Assessment Details Patient Name: Date of Service: [name] [name] 10/18/2020 8:00 A M Medical Record Number: [id number] Patient Account Number: [id number] Date of Birth/Sex: Treating RN: 1945-07-22 (75 y.o. [name], [name] Primary Care Vicktoria Muckey: Consuello Masse Other Clinician: Referring Ohn Bostic: Treating Brendan Gruwell/Extender: Quentin Cornwall in Treatment: 3 Wound Status Wound Number: 1 Primary Diabetic Wound/Ulcer of the Lower Extremity Etiology: Wound Location: Left, Plantar Metatarsal head fifth Wound Open Wounding Event: Gradually Appeared Status: Date Acquired: 06/29/2020 Comorbid Sleep Apnea, Arrhythmia, Congestive Heart Failure, Coronary Weeks Of Treatment: 3 History: Artery Disease, Type II Diabetes, Osteoarthritis, Neuropathy Clustered Wound: No Photos Wound Measurements Length: (cm) 1.1 Width: (cm) 1.7 Depth: (cm) 0.2 Area: (cm) 1.469 Volume: (cm) 0.294 % Reduction in Area: -1969% % Reduction in Volume: -1300% Epithelialization: Small (1-33%) Tunneling: No Undermining: No Wound Description Classification: Grade 2 Wound Margin: Distinct, outline attached Exudate Amount: Medium Exudate Type: Serosanguineous Exudate Color: red, brown Foul Odor After Cleansing: No Slough/Fibrino No Wound Bed Granulation Amount: Large (67-100%) Exposed Structure Granulation Quality: Red, Pink Fascia Exposed: No Necrotic Amount: Small (1-33%) Fat Layer  (Subcutaneous Tissue) Exposed: Yes Necrotic Quality: Adherent Slough Tendon Exposed: No Muscle Exposed: No Joint Exposed: No Bone Exposed: No Electronic Signature(s) Signed: 10/22/2020 2:09:33 PM By: Sandre Kitty Signed: 11/01/2020 3:14:58 PM By: Rhae Hammock RN Entered By: Sandre Kitty on 10/18/2020 16:21:11 -------------------------------------------------------------------------------- [place] Details Patient Name: Date of Service: [name] [name] 10/18/2020 8:00 A M Medical Record Number: [id number] Patient Account Number: [id number] Date of Birth/Sex: Treating RN: May 14, 1946 (75 y.o. [name], [name] Primary Care Kwadwo Taras: Consuello Masse Other Clinician: Referring Garek Schuneman: Treating Domenique Southers/Extender: Quentin Cornwall in Treatment: 3 Vital Signs Time Taken: 08:45 Temperature (F): 97.9 Height (in): 74 Pulse (bpm): 56 Weight (lbs): 238 Respiratory Rate (breaths/min): 17 Body Mass Index (BMI): 30.6 Blood Pressure (mmHg): 101/60 Capillary Blood Glucose (mg/dl): 158 Reference Range: 80 - 120 mg / dl Electronic Signature(s) Signed: 11/01/2020 3:14:58 PM By: Rhae Hammock RN Entered By: Rhae Hammock on 10/18/2020 08:45:18

## 2020-10-18 NOTE — Progress Notes (Signed)
BAYARD, MORE ([id number]) Visit Report for 10/18/2020 Chief Complaint Document Details Patient Name: Date of Service: CRISTON, CHANCELLOR 10/18/2020 8:00 A M Medical Record Number: [id number] Patient Account Number: [id number] Date of Birth/Sex: Treating RN: [birth year] (75 y.o. [name] Primary Care Provider: Consuello Masse Other Clinician: Referring Provider: Treating Provider/Extender: Quentin Cornwall in Treatment: 3 Information Obtained from: Patient Chief Complaint 09/27/2020; patient is here for review of the wound over the left fifth metatarsal head Electronic Signature(s) Signed: 10/18/2020 10:39:02 AM By: Kalman Shan DO Entered By: Kalman Shan on 10/18/2020 10:27:55 -------------------------------------------------------------------------------- Debridement Details Patient Name: Date of Service: [name] 10/18/2020 8:00 A M Medical Record Number: [id number] Patient Account Number: [id number] Date of Birth/Sex: Treating RN: 06-Apr-1946 (74 y.o. [name] Primary Care Provider: Consuello Masse Other Clinician: Referring Provider: Treating Provider/Extender: Quentin Cornwall in Treatment: 3 Debridement Performed for Assessment: Wound #1 Left,Plantar Metatarsal head fifth Performed By: Physician Kalman Shan, DO Debridement Type: Debridement Severity of Tissue Pre Debridement: Fat layer exposed Level of Consciousness (Pre-procedure): Awake and Alert Pre-procedure Verification/Time Out Yes - 09:14 Taken: Start Time: 09:15 Pain Control: Other : Benzocaine T Area Debrided (L x W): otal 1.1 (cm) x 1.7 (cm) = 1.87 (cm) Tissue and other material debrided: Non-Viable, Callus, Skin: Dermis Level: Skin/Dermis Debridement Description: Selective/Open Wound Instrument: Blade, Curette Bleeding: Minimum Hemostasis Achieved: Pressure End Time: 09:21 Response to Treatment: Procedure was tolerated well Level of  Consciousness (Post- Awake and Alert procedure): Post Debridement Measurements of Total Wound Length: (cm) 1.1 Width: (cm) 1.7 Depth: (cm) 0.2 Volume: (cm) 0.294 Character of Wound/Ulcer Post Debridement: Stable Severity of Tissue Post Debridement: Fat layer exposed Post Procedure Diagnosis Same as Pre-procedure Electronic Signature(s) Signed: 10/18/2020 10:39:02 AM By: Kalman Shan DO Signed: 10/18/2020 5:01:44 PM By: Lorrin Jackson Entered By: Lorrin Jackson on 10/18/2020 09:24:28 -------------------------------------------------------------------------------- HPI Details Patient Name: Date of Service: [name] 10/18/2020 8:00 A M Medical Record Number: [id number] Patient Account Number: [id number] Date of Birth/Sex: Treating RN: 1945/11/29 (75 y.o. [name] Primary Care Provider: Consuello Masse Other Clinician: Referring Provider: Treating Provider/Extender: Quentin Cornwall in Treatment: 3 History of Present Illness HPI Description: ADMISSION 09/27/2020 This is a 75 year old man who lives in [place]. He apparently has had callus over the plantar fifth metatarsal head in the past for which she is followed by podiatry in [place]. They shaved down the callus and this was done in January. He states that he went to [place] in January and became aware of when he was there of an open wound in this area. He followed up with podiatry and has been soaking this twice a day with Epson salts and using Silvadene. Apparently one of the podiatrist told him he may need surgery because of bone protrusion. Past medical history includes an ischemic cardiomyopathy, type 2 diabetes with peripheral neuropathy, BPH, PVD, orthostatic hypotension, atrial fibrillation, hyperlipidemia and hypertension Arterial studies in 2018 showed a noncompressible ABI on the left. It was again noncompressible today at 1.7 although his pulses are easily palpable 4/8; patient  I admitted to the clinic last week. Wound on the plantar left fifth metatarsal head which has been refractory. He also has a bit of subluxation of the bone in the head. He is a type II diabetic with peripheral neuropathy. We used silver collagen after debridement He arrives back in clinic today. He has erythema spreading into the lateral part of the  fifth metatarsal head. I removed some undermining tissue from around the wound and clearly there is a connection between the wound and the lateral part of the fifth met head. A culture was done. Area of erythema on the lateral part of the foot was marked. His wife says that has been there since Wednesday 4/15; the patient arrived last week with erythema spreading in the lateral part of the fifth metatarsal head. There was a wound connected with the plantar fifth met head original wound. I gave him empiric Augmentin. Surprisingly the culture I did was negative. We have been using silver alginate. The wound has been open since January. He arrives in clinic today with the erythema much better 4/22; patient presents today for 1 week follow-up of his fifth metatarsal head wound. Daughter is present with the patient. She states that the wound has improved significantly since 2 weeks ago. There is no longer redness to the foot and the actual wound bed is smaller. Patient overall feels well and has no complaints today. Electronic Signature(s) Signed: 10/18/2020 10:39:02 AM By: Kalman Shan DO Entered By: Kalman Shan on 10/18/2020 10:29:45 -------------------------------------------------------------------------------- Physical Exam Details Patient Name: Date of Service: [name] 10/18/2020 8:00 A M Medical Record Number: [id number] Patient Account Number: [id number] Date of Birth/Sex: Treating RN: 04-Apr-1946 (75 y.o. [name] Primary Care Provider: Consuello Masse Other Clinician: Referring Provider: Treating Provider/Extender:  Quentin Cornwall in Treatment: 3 Constitutional respirations regular, non-labored and within target range for patient.. Cardiovascular 2+ dorsalis pedis/posterior tibialis pulses. Psychiatric pleasant and cooperative. Notes Right foot: Fifth metatarsal head on the lateral aspect has an open wound with healthy granulation tissue present. No erythema or increased warmth was noted. No signs of infection. Calluses present circumferentially. Electronic Signature(s) Signed: 10/18/2020 10:39:02 AM By: Kalman Shan DO Entered By: Kalman Shan on 10/18/2020 10:30:44 -------------------------------------------------------------------------------- Physician Orders Details Patient Name: Date of Service: [name] 10/18/2020 8:00 A M Medical Record Number: [id number] Patient Account Number: [id number] Date of Birth/Sex: Treating RN: [birth year] (75 y.o. [name] Primary Care Provider: Consuello Masse Other Clinician: Referring Provider: Treating Provider/Extender: Quentin Cornwall in Treatment: 3 Verbal / Phone Orders: No Diagnosis Coding ICD-10 Coding Code Description E11.621 Type 2 diabetes mellitus with foot ulcer L97.528 Non-pressure chronic ulcer of other part of left foot with other specified severity E11.42 Type 2 diabetes mellitus with diabetic polyneuropathy L03.116 Cellulitis of left lower limb Follow-up Appointments Return Appointment in 1 week. Bathing/ Shower/ Hygiene May shower with protection but do not get wound dressing(s) wet. - On no dressing change days May shower and wash wound with soap and water. - on days changing dressing. Do not soak foot. Edema Control - Lymphedema / SCD / Other Elevate legs to the level of the heart or above for 30 minutes daily and/or when sitting, a frequency of: Avoid standing for long periods of time. Off-Loading Open toe surgical shoe to: - With Insert-Reduce pressure and friction  to area as much as possible. Additional Orders / Instructions Follow Nutritious Diet Wound Treatment Wound #1 - Metatarsal head fifth Wound Laterality: Plantar, Left Cleanser: Soap and Water Every Other Day/15 Days Discharge Instructions: May shower and wash wound with dial antibacterial soap and water prior to dressing change. Prim Dressing: KerraCel Ag Gelling Fiber Dressing, 2x2 in (silver alginate) (Generic) Every Other Day/15 Days ary Discharge Instructions: Apply silver alginate to wound bed as instructed Secondary Dressing: Woven Gauze Sponges 2x2 in Every  Other Day/15 Days Discharge Instructions: Apply over primary dressing as directed. Secondary Dressing: Optifoam Non-Adhesive Dressing, 4x4 in Every Other Day/15 Days Discharge Instructions: Apply foam donut over primary dressing Secured With: Kerlix Roll Sterile, 4.5x3.1 (in/yd) Every Other Day/15 Days Discharge Instructions: Secure with Kerlix as directed. Secured With: Transpore Surgical Tape, 2x10 (in/yd) Every Other Day/15 Days Discharge Instructions: Secure dressing with tape as directed. Consults Vascular - For Non-Healing Wound, Non Compressible ABI's Electronic Signature(s) Signed: 10/18/2020 10:39:02 AM By: Kalman Shan DO Previous Signature: 10/18/2020 7:54:55 AM Version By: Lorrin Jackson Entered By: Kalman Shan on 10/18/2020 10:30:55 Prescription 10/18/2020 -------------------------------------------------------------------------------- [name] Kalman Shan DO Patient Name: Provider: 04/23/1946 [id number] Date of Birth: NPI#: Jerilynn [id number] [id number] Sex: DEA #: [phone] Phone #: License #: [place] Patient Address: [place], [place] [address] [place], [place] [address] [phone] Allergies No Known Allergies Provider's Orders Vascular - For Non-Healing Wound, Non Compressible ABI's Hand  Signature: Date(s): Electronic Signature(s) Signed: 10/18/2020 10:39:02 AM By: Kalman Shan DO Entered By: Kalman Shan on 10/18/2020 10:30:56 -------------------------------------------------------------------------------- Problem List Details Patient Name: Date of Service: [name] 10/18/2020 8:00 A M Medical Record Number: [id number] Patient Account Number: [id number] Date of Birth/Sex: Treating RN: 11-15-1945 (75 y.o. [name] Primary Care Provider: Consuello Masse Other Clinician: Referring Provider: Treating Provider/Extender: Quentin Cornwall in Treatment: 3 Active Problems ICD-10 Encounter Code Description Active Date MDM Diagnosis E11.621 Type 2 diabetes mellitus with foot ulcer 09/27/2020 No Yes L97.528 Non-pressure chronic ulcer of other part of left foot with other specified 09/27/2020 No Yes severity E11.42 Type 2 diabetes mellitus with diabetic polyneuropathy 09/27/2020 No Yes L03.116 Cellulitis of left lower limb 10/04/2020 No Yes Inactive Problems Resolved Problems Electronic Signature(s) Signed: 10/18/2020 10:39:02 AM By: Kalman Shan DO Previous Signature: 10/18/2020 7:54:23 AM Version By: Lorrin Jackson Entered By: Kalman Shan on 10/18/2020 10:26:27 -------------------------------------------------------------------------------- Progress Note Details Patient Name: Date of Service: [name] 10/18/2020 8:00 A M Medical Record Number: [id number] Patient Account Number: [id number] Date of Birth/Sex: Treating RN: [birth year] (75 y.o. [name] Primary Care Provider: Consuello Masse Other Clinician: Referring Provider: Treating Provider/Extender: Quentin Cornwall in Treatment: 3 Subjective Chief Complaint Information obtained from Patient 09/27/2020; patient is here for review of the wound over the left fifth metatarsal head History of Present Illness (HPI) ADMISSION 09/27/2020 This is  a 75 year old man who lives in [place]. He apparently has had callus over the plantar fifth metatarsal head in the past for which she is followed by podiatry in [place]. They shaved down the callus and this was done in January. He states that he went to [place] in January and became aware of when he was there of an open wound in this area. He followed up with podiatry and has been soaking this twice a day with Epson salts and using Silvadene. Apparently one of the podiatrist told him he may need surgery because of bone protrusion. Past medical history includes an ischemic cardiomyopathy, type 2 diabetes with peripheral neuropathy, BPH, PVD, orthostatic hypotension, atrial fibrillation, hyperlipidemia and hypertension Arterial studies in 2018 showed a noncompressible ABI on the left. It was again noncompressible today at 1.7 although his pulses are easily palpable 4/8; patient I admitted to the clinic last week. Wound on the plantar left fifth metatarsal head which has been refractory. He also has a bit of subluxation of  the bone in the head. He is a type II diabetic with peripheral neuropathy. We used silver collagen after debridement He arrives back in clinic today. He has erythema spreading into the lateral part of the fifth metatarsal head. I removed some undermining tissue from around the wound and clearly there is a connection between the wound and the lateral part of the fifth met head. A culture was done. Area of erythema on the lateral part of the foot was marked. His wife says that has been there since Wednesday 4/15; the patient arrived last week with erythema spreading in the lateral part of the fifth metatarsal head. There was a wound connected with the plantar fifth met head original wound. I gave him empiric Augmentin. Surprisingly the culture I did was negative. We have been using silver alginate. The wound has been open since January. He arrives in clinic today with the  erythema much better 4/22; patient presents today for 1 week follow-up of his fifth metatarsal head wound. Daughter is present with the patient. She states that the wound has improved significantly since 2 weeks ago. There is no longer redness to the foot and the actual wound bed is smaller. Patient overall feels well and has no complaints today. Patient History Information obtained from Patient. Family History Cancer - Mother, Heart Disease - Father, No family history of Diabetes, Hereditary Spherocytosis, Hypertension, Kidney Disease, Lung Disease, Seizures, Stroke, Thyroid Problems, Tuberculosis. 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 Medical History Eyes Denies history of Cataracts, Glaucoma, Optic Neuritis Ear/Nose/Mouth/Throat Denies history of Chronic sinus problems/congestion, Middle ear problems Hematologic/Lymphatic Denies history of Anemia, Hemophilia, Human Immunodeficiency Virus, Lymphedema, Sickle Cell Disease Respiratory Patient has history of Sleep Apnea - wears bi pap at night Denies history of Aspiration, Asthma, Chronic Obstructive Pulmonary Disease (COPD), Pneumothorax, Tuberculosis Cardiovascular Patient has history of Arrhythmia - A FibB, Congestive Heart Failure, Coronary Artery Disease Denies history of Angina, Deep Vein Thrombosis, Hypertension, Hypotension, Myocardial Infarction, Peripheral Arterial Disease, Peripheral Venous Disease, Phlebitis, Vasculitis Gastrointestinal Denies history of Cirrhosis , Colitis, Crohnoos, Hepatitis A, Hepatitis B, Hepatitis C Endocrine Patient has history of Type II Diabetes Denies history of Type I Diabetes Genitourinary Denies history of End Stage Renal Disease Immunological Denies history of Lupus Erythematosus, Raynaudoos, Scleroderma Integumentary (Skin) Denies history of History of Burn Musculoskeletal Patient has  history of Osteoarthritis Denies history of Gout, Rheumatoid Arthritis, Osteomyelitis Neurologic Patient has history of Neuropathy Denies history of Dementia, Quadriplegia, Paraplegia, Seizure Disorder Hospitalization/Surgery History - cardiac cath. - cardioversion. - coronary artery bypass graft. - lumbar laminectomy. Medical A Surgical History Notes nd Cardiovascular ischemic cardiomyopathy, hypercholesterolemia, heart murmur, Genitourinary BPH Neurologic wall[place] stroke, spinal stenosis of lumbar region Objective Constitutional respirations regular, non-labored and within target range for patient.. Vitals Time Taken: 8:45 AM, Height: 74 in, Weight: 238 lbs, BMI: 30.6, Temperature: 97.9 F, Pulse: 56 bpm, Respiratory Rate: 17 breaths/min, Blood Pressure: 101/60 mmHg, Capillary Blood Glucose: 158 mg/dl. Cardiovascular 2+ dorsalis pedis/posterior tibialis pulses. Psychiatric pleasant and cooperative. General Notes: Right foot: Fifth metatarsal head on the lateral aspect has an open wound with healthy granulation tissue present. No erythema or increased warmth was noted. No signs of infection. Calluses present circumferentially. Integumentary (Hair, Skin) Wound #1 status is Open. Original cause of wound was Gradually Appeared. The date acquired was: 06/29/2020. The wound has been in treatment 3 weeks. The wound is located on the Left,Plantar  Metatarsal head fifth. The wound measures 1.1cm length x 1.7cm width x 0.2cm depth; 1.469cm^2 area and 0.294cm^3 volume. There is Fat Layer (Subcutaneous Tissue) exposed. There is no tunneling or undermining noted. There is a medium amount of serosanguineous drainage noted. The wound margin is distinct with the outline attached to the wound base. There is large (67-100%) red, pink granulation within the wound bed. There is a small (1-33%) amount of necrotic tissue within the wound bed including Adherent Slough. Assessment Active  Problems ICD-10 Type 2 diabetes mellitus with foot ulcer Non-pressure chronic ulcer of other part of left foot with other specified severity Type 2 diabetes mellitus with diabetic polyneuropathy Cellulitis of left lower limb Patient presents today for a lateral diabetic left foot ulcer of the fifth metatarsal head. Overall there has been improvement to the wound size and appearance. He did have an infection that was treated with Augmentin and he has completed this. He had a left foot x-ray ordered at last clinic visit that showed there was soft tissue irregularity along the lateral aspect of the foot with no acute osseous abnormality. The x-ray also showed atherosclerotic calcification of the small arteries in the foot. He has had ABIs in our office that were noncompressible. He also had ABIs done in 2018 that showed noncompressible arteries with a TBI of 0.5 on the right and 0.37 on the left. He had monophasic waveforms to the ATA and PTA on the left. He states he has not seen vascular surgery. I would like for him to have a consult to review these test results and see if any further intervention is needed. All x-ray results were discussed with the patient and daughter today. There was discussion about a total contact cast however there are no casts in the office today to be placed. With improvement in the wound I think it is appropriate to continue wound care and reassess the need for the cast at the next clinic visit. Procedures Wound #1 Pre-procedure diagnosis of Wound #1 is a Diabetic Wound/Ulcer of the Lower Extremity located on the Left,Plantar Metatarsal head fifth .Severity of Tissue Pre Debridement is: Fat layer exposed. There was a Selective/Open Wound Skin/Dermis Debridement with a total area of 1.87 sq cm performed by Kalman Shan, DO. With the following instrument(s): Blade, and Curette to remove Non-Viable tissue/material. Material removed includes Callus and Skin: Dermis and  after achieving pain control using Other (Benzocaine). No specimens were taken. A time out was conducted at 09:14, prior to the start of the procedure. A Minimum amount of bleeding was controlled with Pressure. The procedure was tolerated well. Post Debridement Measurements: 1.1cm length x 1.7cm width x 0.2cm depth; 0.294cm^3 volume. Character of Wound/Ulcer Post Debridement is stable. Severity of Tissue Post Debridement is: Fat layer exposed. Post procedure Diagnosis Wound #1: Same as Pre-Procedure Plan Follow-up Appointments: Return Appointment in 1 week. Bathing/ Shower/ Hygiene: May shower with protection but do not get wound dressing(s) wet. - On no dressing change days May shower and wash wound with soap and water. - on days changing dressing. Do not soak foot. Edema Control - Lymphedema / SCD / Other: Elevate legs to the level of the heart or above for 30 minutes daily and/or when sitting, a frequency of: Avoid standing for long periods of time. Off-Loading: Open toe surgical shoe to: - With Insert-Reduce pressure and friction to area as much as possible. Additional Orders / Instructions: Follow Nutritious Diet Consults ordered were: Vascular - For Non-Healing Wound, Non Compressible  ABI's WOUND #1: - Metatarsal head fifth Wound Laterality: Plantar, Left Cleanser: Soap and Water Every Other Day/15 Days Discharge Instructions: May shower and wash wound with dial antibacterial soap and water prior to dressing change. Prim Dressing: KerraCel Ag Gelling Fiber Dressing, 2x2 in (silver alginate) (Generic) Every Other Day/15 Days ary Discharge Instructions: Apply silver alginate to wound bed as instructed Secondary Dressing: Woven Gauze Sponges 2x2 in Every Other Day/15 Days Discharge Instructions: Apply over primary dressing as directed. Secondary Dressing: Optifoam Non-Adhesive Dressing, 4x4 in Every Other Day/15 Days Discharge Instructions: Apply foam donut over primary  dressing Secured With: Kerlix Roll Sterile, 4.5x3.1 (in/yd) Every Other Day/15 Days Discharge Instructions: Secure with Kerlix as directed. Secured With: Transpore Surgical T ape, 2x10 (in/yd) Every Other Day/15 Days Discharge Instructions: Secure dressing with tape as directed. 1. Silver alginate every other day 2. Aggressive offloading 3. Vascular consult 4. Follow-up next week Electronic Signature(s) Signed: 10/18/2020 10:39:02 AM By: Kalman Shan DO Entered By: Kalman Shan on 10/18/2020 10:37:33 -------------------------------------------------------------------------------- HxROS Details Patient Name: Date of Service: [name] 10/18/2020 8:00 A M Medical Record Number: [id number] Patient Account Number: [id number] Date of Birth/Sex: Treating RN: [birth year] (75 y.o. [name] Primary Care Provider: Consuello Masse Other Clinician: Referring Provider: Treating Provider/Extender: Quentin Cornwall in Treatment: 3 Information Obtained From Patient Eyes Medical History: Negative for: Cataracts; Glaucoma; Optic Neuritis Ear/Nose/Mouth/Throat Medical History: Negative for: Chronic sinus problems/congestion; Middle ear problems Hematologic/Lymphatic Medical History: Negative for: Anemia; Hemophilia; Human Immunodeficiency Virus; Lymphedema; Sickle Cell Disease Respiratory Medical History: Positive for: Sleep Apnea - wears bi pap at night Negative for: Aspiration; Asthma; Chronic Obstructive Pulmonary Disease (COPD); Pneumothorax; Tuberculosis Cardiovascular Medical History: Positive for: Arrhythmia - A FibB; Congestive Heart Failure; Coronary Artery Disease Negative for: Angina; Deep Vein Thrombosis; Hypertension; Hypotension; Myocardial Infarction; Peripheral Arterial Disease; Peripheral Venous Disease; Phlebitis; Vasculitis Past Medical History Notes: ischemic cardiomyopathy, hypercholesterolemia, heart  murmur, Gastrointestinal Medical History: Negative for: Cirrhosis ; Colitis; Crohns; Hepatitis A; Hepatitis B; Hepatitis C Endocrine Medical History: Positive for: Type II Diabetes Negative for: Type I Diabetes Time with diabetes: 25 years Treated with: Oral agents Genitourinary Medical History: Negative for: End Stage Renal Disease Past Medical History Notes: BPH Immunological Medical History: Negative for: Lupus Erythematosus; Raynauds; Scleroderma Integumentary (Skin) Medical History: Negative for: History of Burn Musculoskeletal Medical History: Positive for: Osteoarthritis Negative for: Gout; Rheumatoid Arthritis; Osteomyelitis Neurologic Medical History: Positive for: Neuropathy Negative for: Dementia; Quadriplegia; Paraplegia; Seizure Disorder Past Medical History Notes: [place] stroke, spinal stenosis of lumbar region Immunizations Pneumococcal Vaccine: Received Pneumococcal Vaccination: Yes Implantable Devices None Hospitalization / Surgery History Type of Hospitalization/Surgery cardiac cath cardioversion coronary artery bypass graft lumbar laminectomy Family and Social History Cancer: Yes - Mother; Diabetes: No; Heart Disease: Yes - Father; Hereditary Spherocytosis: No; Hypertension: No; Kidney Disease: No; Lung Disease: No; Seizures: No; Stroke: No; Thyroid Problems: No; Tuberculosis: No; 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; Marital Status - Married; Alcohol Use: Rarely; Drug Use: No History; Caffeine Use: Rarely; Financial Concerns: No; Food, Clothing or Shelter Needs: No; Support System Lacking: No; Transportation Concerns: No Electronic Signature(s) Signed: 10/18/2020 10:39:02 AM By: Kalman Shan DO Signed: 10/18/2020 5:01:44 PM By: Lorrin Jackson Entered By: Kalman Shan on 10/18/2020 10:29:51 -------------------------------------------------------------------------------- [place] Details Patient Name: Date of Service: [name]  10/18/2020 Medical Record Number: [id number] Patient Account Number: [id number] Date of Birth/Sex: Treating RN: [birth year] (74 y.o. 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

## 2020-10-25 ENCOUNTER — Encounter (HOSPITAL_COMMUNITY): Payer: Medicare Other

## 2020-10-25 ENCOUNTER — Other Ambulatory Visit: Payer: Self-pay

## 2020-10-25 ENCOUNTER — Other Ambulatory Visit (HOSPITAL_COMMUNITY): Payer: Medicare Other

## 2020-10-25 ENCOUNTER — Encounter (HOSPITAL_BASED_OUTPATIENT_CLINIC_OR_DEPARTMENT_OTHER): Payer: Medicare Other | Admitting: Internal Medicine

## 2020-10-25 DIAGNOSIS — I255 Ischemic cardiomyopathy: Secondary | ICD-10-CM | POA: Diagnosis not present

## 2020-10-25 DIAGNOSIS — E1142 Type 2 diabetes mellitus with diabetic polyneuropathy: Secondary | ICD-10-CM

## 2020-10-25 DIAGNOSIS — L97528 Non-pressure chronic ulcer of other part of left foot with other specified severity: Secondary | ICD-10-CM | POA: Diagnosis not present

## 2020-10-25 DIAGNOSIS — L03116 Cellulitis of left lower limb: Secondary | ICD-10-CM | POA: Diagnosis not present

## 2020-10-25 DIAGNOSIS — I779 Disorder of arteries and arterioles, unspecified: Secondary | ICD-10-CM | POA: Diagnosis not present

## 2020-10-25 DIAGNOSIS — E11621 Type 2 diabetes mellitus with foot ulcer: Secondary | ICD-10-CM

## 2020-10-25 DIAGNOSIS — E1151 Type 2 diabetes mellitus with diabetic peripheral angiopathy without gangrene: Secondary | ICD-10-CM | POA: Diagnosis not present

## 2020-10-25 NOTE — Progress Notes (Signed)
[name] [name] ([id number]) Visit Report for 10/25/2020 Chief Complaint Document Details Patient Name: Date of Service: [name], [name] 10/25/2020 8:30 A M Medical Record Number: [id number] Patient Account Number: [id number] Date of Birth/Sex: Treating RN: 12-17-1945 (75 y.o. [name] [name] Primary Care Provider: Consuello [name] Other Clinician: Referring Provider: Treating Provider/Extender: [name] [name] in Treatment: 4 Information Obtained from: Patient Chief Complaint 09/27/2020; patient is here for review of the wound over the left fifth metatarsal head Electronic Signature(s) Signed: 10/25/2020 9:16:38 AM By: Kalman Shan DO Entered By: Kalman Shan on 10/25/2020 09:09:25 -------------------------------------------------------------------------------- Debridement Details Patient Name: Date of Service: [name] [name] 10/25/2020 8:30 A M Medical Record Number: [id number] Patient Account Number: [id number] Date of Birth/Sex: Treating RN: [birth year] (74 y.o. [name] [name] Primary Care Provider: Consuello [name] Other Clinician: Referring Provider: Treating Provider/Extender: [name] [name] in Treatment: 4 Debridement Performed for Assessment: Wound #1 Left,Plantar Metatarsal head fifth Performed By: Physician Kalman Shan, DO Debridement Type: Chemical/Enzymatic/Mechanical Agent Used: Santyl Severity of Tissue Pre Debridement: Fat layer exposed Level of Consciousness (Pre-procedure): Awake and Alert Pre-procedure Verification/Time Out Yes - 08:57 Taken: Start Time: 08:58 Bleeding: None End Time: 08:59 Response to Treatment: Procedure was tolerated well Level of Consciousness (Post- Awake and Alert procedure): Post Debridement Measurements of Total Wound Length: (cm) 1.1 Width: (cm) 1.7 Depth: (cm) 0.4 Volume: (cm) 0.587 Character of Wound/Ulcer Post Debridement: Stable Severity of Tissue Post Debridement: Fat  layer exposed Post Procedure Diagnosis Same as Pre-procedure Electronic Signature(s) Signed: 10/25/2020 9:16:38 AM By: Kalman Shan DO Signed: 10/25/2020 5:13:52 PM By: Lorrin Jackson Entered By: Lorrin Jackson on 10/25/2020 09:00:11 -------------------------------------------------------------------------------- HPI Details Patient Name: Date of Service: [name] [name] 10/25/2020 8:30 A M Medical Record Number: [id number] Patient Account Number: [id number] Date of Birth/Sex: Treating RN: Mar 24, 1946 (75 y.o. [name] [name] Primary Care Provider: Consuello [name] Other Clinician: Referring Provider: Treating Provider/Extender: [name] [name] in Treatment: 4 History of Present Illness HPI Description: ADMISSION 09/27/2020 This is a 75 year old man who lives in [place]. He apparently has had callus over the plantar fifth metatarsal head in the past for which she is followed by podiatry in [place]. They shaved down the callus and this was done in January. He states that he went to [place] in January and became aware of when he was there of an open wound in this area. He followed up with podiatry and has been soaking this twice a day with Epson salts and using Silvadene. Apparently one of the podiatrist told him he may need surgery because of bone protrusion. Past medical history includes an ischemic cardiomyopathy, type 2 diabetes with peripheral neuropathy, BPH, PVD, orthostatic hypotension, atrial fibrillation, hyperlipidemia and hypertension Arterial studies in 2018 showed a noncompressible ABI on the left. It was again noncompressible today at 1.7 although his pulses are easily palpable 4/8; patient I admitted to the clinic last week. Wound on the plantar left fifth metatarsal head which has been refractory. He also has a bit of subluxation of the bone in the head. He is a type II diabetic with peripheral neuropathy. We used silver collagen after  debridement He arrives back in clinic today. He has erythema spreading into the lateral part of the fifth metatarsal head. I removed some undermining tissue from around the wound and clearly there is a connection between the wound and the lateral part of the fifth met head. A culture was done. Area of erythema on  the lateral part of the foot was marked. His wife says that has been there since Wednesday 4/15; the patient arrived last week with erythema spreading in the lateral part of the fifth metatarsal head. There was a wound connected with the plantar fifth met head original wound. I gave him empiric Augmentin. Surprisingly the culture I did was negative. We have been using silver alginate. The wound has been open since January. He arrives in clinic today with the erythema much better 4/22; patient presents today for 1 week follow-up of his fifth metatarsal head wound. Daughter is present with the patient. She states that the wound has improved significantly since 2 weeks ago. There is no longer redness to the foot and the actual wound bed is smaller. Patient overall feels well and has no complaints today. 4/29; patient presents for 1 week follow-up of his fifth metatarsal head wound. Daughter is present. Patient has been using silver alginate every other day to the wound site. He has tried to relieve pressure with a surgical shoe. He denies signs of infections. Electronic Signature(s) Signed: 10/25/2020 9:16:38 AM By: Kalman Shan DO Entered By: Kalman Shan on 10/25/2020 09:10:24 -------------------------------------------------------------------------------- Physical Exam Details Patient Name: Date of Service: [name] [name] 10/25/2020 8:30 A M Medical Record Number: [id number] Patient Account Number: [id number] Date of Birth/Sex: Treating RN: May 15, 1946 (76 y.o. [name] [name] Primary Care Provider: Consuello [name] Other Clinician: Referring Provider: Treating  Provider/Extender: [name] [name] in Treatment: 4 Constitutional respirations regular, non-labored and within target range for patient.. Cardiovascular 2+ dorsalis pedis/posterior tibialis pulses. Psychiatric pleasant and cooperative. Notes Right foot: The Left plantar fifth head metatarsal has an open wound half of the wound bed has healthy pink granulation tissue and the other side has an eschar. No signs of infection. Electronic Signature(s) Signed: 10/25/2020 9:16:38 AM By: Kalman Shan DO Entered By: Kalman Shan on 10/25/2020 09:10:40 -------------------------------------------------------------------------------- Physician Orders Details Patient Name: Date of Service: [name] [name] 10/25/2020 8:30 A M Medical Record Number: [id number] Patient Account Number: [id number] Date of Birth/Sex: Treating RN: 11-Jun-1946 (75 y.o. [name] [name] Primary Care Provider: Consuello [name] Other Clinician: Referring Provider: Treating Provider/Extender: [name] [name] in Treatment: 4 Verbal / Phone Orders: No Diagnosis Coding ICD-10 Coding Code Description E11.621 Type 2 diabetes mellitus with foot ulcer L97.528 Non-pressure chronic ulcer of other part of left foot with other specified severity E11.42 Type 2 diabetes mellitus with diabetic polyneuropathy L03.116 Cellulitis of left lower limb Follow-up Appointments ppointment in 1 week. - with Dr. Dellia Nims Return A Bathing/ Shower/ Hygiene May shower and wash wound with soap and water. - on days changing dressing. Do not soak foot. Edema Control - Lymphedema / SCD / Other Elevate legs to the level of the heart or above for 30 minutes daily and/or when sitting, a frequency of: Avoid standing for long periods of time. Off-Loading Open toe surgical shoe to: - With Insert-Reduce pressure and friction to area as much as possible. Additional Orders / Instructions Follow Nutritious  Diet Wound Treatment Wound #1 - Metatarsal head fifth Wound Laterality: Plantar, Left Cleanser: Soap and Water 1 x Per Day/15 Days Discharge Instructions: May shower and wash wound with dial antibacterial soap and water prior to dressing change. Prim Dressing: Santyl Ointment 1 x Per Day/15 Days ary Discharge Instructions: Apply nickel thick amount to wound bed as instructed Secondary Dressing: Woven Gauze Sponges 2x2 in (DME) (Generic) 1 x Per [facility]/68 Days Discharge Instructions: Apply over  primary dressing as directed. Secondary Dressing: Optifoam Non-Adhesive Dressing, 4x4 in 1 x Per Day/15 Days Discharge Instructions: Apply foam donut over primary dressing Secondary Dressing: Zetuvit Plus Silicone Border Dressing 4x4 (in/in) (DME) (Generic) 1 x Per Day/15 Days Discharge Instructions: Apply silicone border over primary dressing as directed. Patient Medications llergies: No Known Allergies A Notifications Medication Indication Start End 10/27/2020 Santyl DOSE 1 - topical 250 unit/gram ointment - one application daily Electronic Signature(s) Signed: 10/25/2020 9:15:49 AM By: Kalman Shan DO Previous Signature: 10/25/2020 8:42:28 AM Version By: Lorrin Jackson Entered By: Kalman Shan on 10/25/2020 09:15:48 -------------------------------------------------------------------------------- Problem List Details Patient Name: Date of Service: [name] [name] 10/25/2020 8:30 A M Medical Record Number: [id number] Patient Account Number: [id number] Date of Birth/Sex: Treating RN: 07/15/1945 (75 y.o. [name] [name] Primary Care Provider: Consuello [name] Other Clinician: Referring Provider: Treating Provider/Extender: [name] [name] in Treatment: 4 Active Problems ICD-10 Encounter Code Description Active Date MDM Diagnosis E11.621 Type 2 diabetes mellitus with foot ulcer 09/27/2020 No Yes L97.528 Non-pressure chronic ulcer of other part of left foot with other  specified 09/27/2020 No Yes severity E11.42 Type 2 diabetes mellitus with diabetic polyneuropathy 09/27/2020 No Yes L03.116 Cellulitis of left lower limb 10/04/2020 No Yes Inactive Problems Resolved Problems Electronic Signature(s) Signed: 10/25/2020 9:16:38 AM By: Kalman Shan DO Previous Signature: 10/25/2020 8:42:08 AM Version By: Lorrin Jackson Entered By: Kalman Shan on 10/25/2020 09:09:07 -------------------------------------------------------------------------------- Progress Note Details Patient Name: Date of Service: [name] [name] 10/25/2020 8:30 A M Medical Record Number: [id number] Patient Account Number: [id number] Date of Birth/Sex: Treating RN: [birth year] (75 y.o. [name] [name] Primary Care Provider: Consuello [name] Other Clinician: Referring Provider: Treating Provider/Extender: [name] [name] in Treatment: 4 Subjective Chief Complaint Information obtained from Patient 09/27/2020; patient is here for review of the wound over the left fifth metatarsal head History of Present Illness (HPI) ADMISSION 09/27/2020 This is a 75 year old man who lives in [place]. He apparently has had callus over the plantar fifth metatarsal head in the past for which she is followed by podiatry in [place]. They shaved down the callus and this was done in January. He states that he went to [place] in January and became aware of when he was there of an open wound in this area. He followed up with podiatry and has been soaking this twice a day with Epson salts and using Silvadene. Apparently one of the podiatrist told him he may need surgery because of bone protrusion. Past medical history includes an ischemic cardiomyopathy, type 2 diabetes with peripheral neuropathy, BPH, PVD, orthostatic hypotension, atrial fibrillation, hyperlipidemia and hypertension Arterial studies in 2018 showed a noncompressible ABI on the left. It was again noncompressible today at 1.7  although his pulses are easily palpable 4/8; patient I admitted to the clinic last week. Wound on the plantar left fifth metatarsal head which has been refractory. He also has a bit of subluxation of the bone in the head. He is a type II diabetic with peripheral neuropathy. We used silver collagen after debridement He arrives back in clinic today. He has erythema spreading into the lateral part of the fifth metatarsal head. I removed some undermining tissue from around the wound and clearly there is a connection between the wound and the lateral part of the fifth met head. A culture was done. Area of erythema on the lateral part of the foot was marked. His wife says that has been there since Wednesday 4/15;  the patient arrived last week with erythema spreading in the lateral part of the fifth metatarsal head. There was a wound connected with the plantar fifth met head original wound. I gave him empiric Augmentin. Surprisingly the culture I did was negative. We have been using silver alginate. The wound has been open since January. He arrives in clinic today with the erythema much better 4/22; patient presents today for 1 week follow-up of his fifth metatarsal head wound. Daughter is present with the patient. She states that the wound has improved significantly since 2 weeks ago. There is no longer redness to the foot and the actual wound bed is smaller. Patient overall feels well and has no complaints today. 4/29; patient presents for 1 week follow-up of his fifth metatarsal head wound. Daughter is present. Patient has been using silver alginate every other day to the wound site. He has tried to relieve pressure with a surgical shoe. He denies signs of infections. Patient History Information obtained from Patient. Family History Cancer - Mother, Heart Disease - Father, No family history of Diabetes, Hereditary Spherocytosis, Hypertension, Kidney Disease, Lung Disease, Seizures, Stroke, Thyroid  Problems, Tuberculosis. 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 Medical History Eyes Denies history of Cataracts, Glaucoma, Optic Neuritis Ear/Nose/Mouth/Throat Denies history of Chronic sinus problems/congestion, Middle ear problems Hematologic/Lymphatic Denies history of Anemia, Hemophilia, Human Immunodeficiency Virus, Lymphedema, Sickle Cell Disease Respiratory Patient has history of Sleep Apnea - wears bi pap at night Denies history of Aspiration, Asthma, Chronic Obstructive Pulmonary Disease (COPD), Pneumothorax, Tuberculosis Cardiovascular Patient has history of Arrhythmia - A FibB, Congestive Heart Failure, Coronary Artery Disease Denies history of Angina, Deep Vein Thrombosis, Hypertension, Hypotension, Myocardial Infarction, Peripheral Arterial Disease, Peripheral Venous Disease, Phlebitis, Vasculitis Gastrointestinal Denies history of Cirrhosis , Colitis, Crohnoos, Hepatitis A, Hepatitis B, Hepatitis C Endocrine Patient has history of Type II Diabetes Denies history of Type I Diabetes Genitourinary Denies history of End Stage Renal Disease Immunological Denies history of Lupus Erythematosus, Raynaudoos, Scleroderma Integumentary (Skin) Denies history of History of Burn Musculoskeletal Patient has history of Osteoarthritis Denies history of Gout, Rheumatoid Arthritis, Osteomyelitis Neurologic Patient has history of Neuropathy Denies history of Dementia, Quadriplegia, Paraplegia, Seizure Disorder Hospitalization/Surgery History - cardiac cath. - cardioversion. - coronary artery bypass graft. - lumbar laminectomy. Medical A Surgical History Notes nd Cardiovascular ischemic cardiomyopathy, hypercholesterolemia, heart murmur, Genitourinary BPH Neurologic wallenburg stroke, spinal stenosis of lumbar region Objective Constitutional respirations regular, non-labored and  within target range for patient.. Vitals Time Taken: 8:19 AM, Height: 74 in, Weight: 238 lbs, BMI: 30.6, Temperature: 97.7 F, Pulse: 66 bpm, Respiratory Rate: 17 breaths/min, Blood Pressure: 91/55 mmHg, Capillary Blood Glucose: 110 mg/dl. Cardiovascular 2+ dorsalis pedis/posterior tibialis pulses. Psychiatric pleasant and cooperative. General Notes: Right foot: The Left plantar fifth head metatarsal has an open wound half of the wound bed has healthy pink granulation tissue and the other side has an eschar. No signs of infection. Integumentary (Hair, Skin) Wound #1 status is Open. Original cause of wound was Gradually Appeared. The date acquired was: 06/29/2020. The wound has been in treatment 4 weeks. The wound is located on the Left,Plantar Metatarsal head fifth. The wound measures 1.1cm length x 1.7cm width x 0.4cm depth; 1.469cm^2 area and 0.587cm^3 volume. There is Fat Layer (Subcutaneous Tissue) exposed. There is undermining starting at 1:00 and ending at 7:00 with a maximum distance of 0.7cm. There is a medium amount  of serosanguineous drainage noted. The wound margin is well defined and not attached to the wound base. There is medium (34-66%) red granulation within the wound bed. There is a medium (34-66%) amount of necrotic tissue within the wound bed including Eschar. Assessment Active Problems ICD-10 Type 2 diabetes mellitus with foot ulcer Non-pressure chronic ulcer of other part of left foot with other specified severity Type 2 diabetes mellitus with diabetic polyneuropathy Cellulitis of left lower limb The wound size is overall stable. Part of the wound has an eschar that cannot be removed in office. This was crosshatched and I recommended using Santyl to the area for the next week. Overall there are no signs of infection. He has not been scheduled with vascular surgery yet. Procedures Wound #1 Pre-procedure diagnosis of Wound #1 is a Diabetic Wound/Ulcer of the Lower  Extremity located on the Left,Plantar Metatarsal head fifth .Severity of Tissue Pre Debridement is: Fat layer exposed. There was a Chemical/Enzymatic/Mechanical debridement performed by Kalman Shan, DO.Marland Kitchen Agent used was [employer]. A time out was conducted at 08:57, prior to the start of the procedure. There was no bleeding. The procedure was tolerated well. Post Debridement Measurements: 1.1cm length x 1.7cm width x 0.4cm depth; 0.587cm^3 volume. Character of Wound/Ulcer Post Debridement is stable. Severity of Tissue Post Debridement is: Fat layer exposed. Post procedure Diagnosis Wound #1: Same as Pre-Procedure Plan Follow-up Appointments: Return Appointment in 1 week. - with Dr. Dellia Nims Bathing/ Shower/ Hygiene: May shower and wash wound with soap and water. - on days changing dressing. Do not soak foot. Edema Control - Lymphedema / SCD / Other: Elevate legs to the level of the heart or above for 30 minutes daily and/or when sitting, a frequency of: Avoid standing for long periods of time. Off-Loading: Open toe surgical shoe to: - With Insert-Reduce pressure and friction to area as much as possible. Additional Orders / Instructions: Follow Nutritious Diet The following medication(s) was prescribed: Santyl topical 250 unit/gram ointment 1 one application daily starting 10/27/2020 WOUND #1: - Metatarsal head fifth Wound Laterality: Plantar, Left Cleanser: Soap and Water 1 x Per Day/15 Days Discharge Instructions: May shower and wash wound with dial antibacterial soap and water prior to dressing change. Prim Dressing: Santyl Ointment 1 x Per Day/15 Days ary Discharge Instructions: Apply nickel thick amount to wound bed as instructed Secondary Dressing: Woven Gauze Sponges 2x2 in (DME) (Generic) 1 x Per Day/15 Days Discharge Instructions: Apply over primary dressing as directed. Secondary Dressing: Optifoam Non-Adhesive Dressing, 4x4 in 1 x Per Day/15 Days Discharge Instructions: Apply foam  donut over primary dressing Secondary Dressing: Zetuvit Plus Silicone Border Dressing 4x4 (in/in) (DME) (Generic) 1 x Per Day/15 Days Discharge Instructions: Apply silicone border over primary dressing as directed. 1. Santyl daily for 1 week 2. Aggressive offloading 3. Follow-up in 1 week Electronic Signature(s) Signed: 10/25/2020 9:16:38 AM By: Kalman Shan DO Entered By: Kalman Shan on 10/25/2020 09:15:58 -------------------------------------------------------------------------------- HxROS Details Patient Name: Date of Service: [name] [name] 10/25/2020 8:30 A M Medical Record Number: [id number] Patient Account Number: [id number] Date of Birth/Sex: Treating RN: 1945-11-15 (75 y.o. [name] [name] Primary Care Provider: Consuello [name] Other Clinician: Referring Provider: Treating Provider/Extender: [name] [name] in Treatment: 4 Information Obtained From Patient Eyes Medical History: Negative for: Cataracts; Glaucoma; Optic Neuritis Ear/Nose/Mouth/Throat Medical History: Negative for: Chronic sinus problems/congestion; Middle ear problems Hematologic/Lymphatic Medical History: Negative for: Anemia; Hemophilia; Human Immunodeficiency Virus; Lymphedema; Sickle Cell Disease Respiratory Medical History: Positive for: Sleep Apnea -  wears bi pap at night Negative for: Aspiration; Asthma; Chronic Obstructive Pulmonary Disease (COPD); Pneumothorax; Tuberculosis Cardiovascular Medical History: Positive for: Arrhythmia - A FibB; Congestive Heart Failure; Coronary Artery Disease Negative for: Angina; Deep Vein Thrombosis; Hypertension; Hypotension; Myocardial Infarction; Peripheral Arterial Disease; Peripheral Venous Disease; Phlebitis; Vasculitis Past Medical History Notes: ischemic cardiomyopathy, hypercholesterolemia, heart murmur, Gastrointestinal Medical History: Negative for: Cirrhosis ; Colitis; Crohns; Hepatitis A; Hepatitis B; Hepatitis  C Endocrine Medical History: Positive for: Type II Diabetes Negative for: Type I Diabetes Time with diabetes: 25 years Treated with: Oral agents Genitourinary Medical History: Negative for: End Stage Renal Disease Past Medical History Notes: BPH Immunological Medical History: Negative for: Lupus Erythematosus; Raynauds; Scleroderma Integumentary (Skin) Medical History: Negative for: History of Burn Musculoskeletal Medical History: Positive for: Osteoarthritis Negative for: Gout; Rheumatoid Arthritis; Osteomyelitis Neurologic Medical History: Positive for: Neuropathy Negative for: Dementia; Quadriplegia; Paraplegia; Seizure Disorder Past Medical History Notes: wallenburg stroke, spinal stenosis of lumbar region Immunizations Pneumococcal Vaccine: Received Pneumococcal Vaccination: Yes Implantable Devices None Hospitalization / Surgery History Type of Hospitalization/Surgery cardiac cath cardioversion coronary artery bypass graft lumbar laminectomy Family and Social History Cancer: Yes - Mother; Diabetes: No; Heart Disease: Yes - Father; Hereditary Spherocytosis: No; Hypertension: No; Kidney Disease: No; Lung Disease: No; Seizures: No; Stroke: No; Thyroid Problems: No; Tuberculosis: No; 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; Marital Status - Married; Alcohol Use: Rarely; Drug Use: No History; Caffeine Use: Rarely; Financial Concerns: No; Food, Clothing or Shelter Needs: No; Support System Lacking: No; Transportation Concerns: No Electronic Signature(s) Signed: 10/25/2020 9:16:38 AM By: Kalman Shan DO Signed: 10/25/2020 5:13:52 PM By: Lorrin Jackson Entered By: Kalman Shan on 10/25/2020 09:10:33 -------------------------------------------------------------------------------- SuperBill Details Patient Name: Date of Service: [name] [name] 10/25/2020 Medical Record Number: [id number] Patient Account Number: [id number] Date of Birth/Sex: Treating RN: 1945/10/23 (74 y.o. [name] [name] Primary Care Provider: Consuello [name] Other Clinician: Referring Provider: Treating Provider/Extender: [name] [name] in Treatment: 4 Diagnosis Coding ICD-10 Codes Code Description [phone] Type 2 diabetes mellitus with foot ulcer L97.528 Non-pressure chronic ulcer of other part of left foot with other specified severity E11.42 Type 2 diabetes mellitus with diabetic polyneuropathy L03.116 Cellulitis of left lower limb Facility Procedures CPT4 Code: [id number] Description: [phone] - DEBRIDE W/O ANES NON SELECT ICD-10 Diagnosis Description L97.528 Non-pressure chronic ulcer of other part of left foot with other specified sever E11.621 Type 2 diabetes mellitus with foot ulcer Modifier: ity Quantity: 1 Electronic Signature(s) Signed: 10/25/2020 9:16:38 AM By: Kalman Shan DO Entered By: Kalman Shan on 10/25/2020 09:16:07

## 2020-10-25 NOTE — Progress Notes (Signed)
ADYEN, BIFULCO ([id number]) Visit Report for 10/25/2020 Arrival Information Details Patient Name: Date of Service: [name] [name], [name] [name] 10/25/2020 8:30 A M Medical Record Number: [id number] Patient Account Number: [id number] Date of Birth/Sex: Treating RN: 08/14/1945 (75 y.o. [name] Primary Care [address]: Consuello Masse Other Clinician: Referring Kylee Umana: Treating Averlee Swartz/Extender: Quentin Cornwall in Treatment: 4 Visit Information History Since Last Visit Added or deleted any medications: No Patient Arrived: Ambulatory Any new allergies or adverse reactions: No Arrival Time: 08:19 Had a fall or experienced change in No Accompanied By: wife activities of daily living that may affect Transfer Assistance: None risk of falls: Patient Identification Verified: Yes Signs or symptoms of abuse/neglect since last visito No Secondary Verification Process Completed: Yes Hospitalized since last visit: No Patient Requires Transmission-Based Precautions: No Implantable device outside of the clinic excluding No Patient Has Alerts: No cellular tissue based products placed in the center since last visit: Has Dressing in Place as Prescribed: Yes Pain Present Now: No Electronic Signature(s) Signed: 10/25/2020 4:35:52 PM By: Sandre Kitty Entered By: Sandre Kitty on 10/25/2020 08:19:51 -------------------------------------------------------------------------------- Encounter Discharge Information Details Patient Name: Date of Service: [name] [name] 10/25/2020 8:30 A M Medical Record Number: [id number] Patient Account Number: [id number] Date of Birth/Sex: Treating RN: 1946/04/15 (74 y.o. [name] Primary Care [address] Harb: Consuello Masse Other Clinician: Referring Lilyauna Miedema: Treating Yardley Lekas/Extender: Quentin Cornwall in Treatment: 4 Encounter Discharge Information Items Post Procedure Vitals Discharge Condition: Stable Temperature  (F): 97.7 Ambulatory Status: Ambulatory Pulse (bpm): 66 Discharge Destination: Home Respiratory Rate (breaths/min): 17 Transportation: Private Auto Blood Pressure (mmHg): 91/55 Accompanied By: self Schedule Follow-up Appointment: Yes Clinical Summary of Care: Electronic Signature(s) Signed: 10/25/2020 5:30:47 PM By: Deon Pilling Entered By: Deon Pilling on 10/25/2020 09:28:51 -------------------------------------------------------------------------------- Lower Extremity Assessment Details Patient Name: Date of Service: [name] [name] 10/25/2020 8:30 A M Medical Record Number: [id number] Patient Account Number: [id number] Date of Birth/Sex: Treating RN: 07-13-1945 (75 y.o. Ernestene Mention Primary Care Glenroy Crossen: Consuello Masse Other Clinician: Referring Tashina Credit: Treating Reggie Bise/Extender: Quentin Cornwall in Treatment: 4 Edema Assessment Assessed: Shirlyn Goltz: No] [Right: No] Edema: [Left: Ye] [Right: s] Calf Left: Right: Point of Measurement: 40 cm From Medial Instep 37 cm Ankle Left: Right: Point of Measurement: 10 cm From Medial Instep 24 cm Vascular Assessment Pulses: Dorsalis Pedis Palpable: [Left:Yes] Electronic Signature(s) Signed: 10/25/2020 5:21:08 PM By: Baruch Gouty RN, BSN Entered By: Baruch Gouty on 10/25/2020 08:24:56 -------------------------------------------------------------------------------- Multi Wound Chart Details Patient Name: Date of Service: [name] [name] 10/25/2020 8:30 A M Medical Record Number: [id number] Patient Account Number: [id number] Date of Birth/Sex: Treating RN: Sep 28, 1945 (74 y.o. [name] Primary Care Demiyah Fischbach: Consuello Masse Other Clinician: Referring Valgene Deloatch: Treating Guerry Covington/Extender: Quentin Cornwall in Treatment: 4 Vital Signs Height(in): 74 Capillary Blood Glucose(mg/dl): 110 Weight(lbs): 238 Pulse(bpm): 21 Body Mass Index(BMI): 31 Blood Pressure(mmHg):  91/55 Temperature(F): 97.7 Respiratory Rate(breaths/min): 17 Photos: [1:No Photos Left, Plantar Metatarsal head fifth] [N/A:N/A N/A] Wound Location: [1:Gradually Appeared] [N/A:N/A] Wounding Event: [1:Diabetic Wound/Ulcer of the Lower] [N/A:N/A] Primary Etiology: [1:Extremity Sleep Apnea, Arrhythmia, Congestive] [N/A:N/A] Comorbid History: [1:Heart Failure, Coronary Artery Disease, Type II Diabetes, Osteoarthritis, Neuropathy 06/29/2020] [N/A:N/A] Date Acquired: [1:4] [N/A:N/A] Weeks of Treatment: [1:Open] [N/A:N/A] Wound Status: [1:1.1x1.7x0.4] [N/A:N/A] Measurements L x W x D (cm) [1:1.469] [N/A:N/A] A (cm) : rea [1:0.587] [N/A:N/A] Volume (cm) : [1:-1969.00%] [N/A:N/A] % Reduction in A rea: [1:-2695.20%] [N/A:N/A] % Reduction in Volume: [1:1] Starting Position 1 (o'clock): [  1:7] Ending Position 1 (o'clock): [1:0.7] Maximum Distance 1 (cm): [1:Yes] [N/A:N/A] Undermining: [1:Grade 2] [N/A:N/A] Classification: [1:Medium] [N/A:N/A] Exudate A mount: [1:Serosanguineous] [N/A:N/A] Exudate Type: [1:red, brown] [N/A:N/A] Exudate Color: [1:Well defined, not attached] [N/A:N/A] Wound Margin: [1:Medium (34-66%)] [N/A:N/A] Granulation A mount: [1:Red] [N/A:N/A] Granulation Quality: [1:Medium (34-66%)] [N/A:N/A] Necrotic A mount: [1:Eschar] [N/A:N/A] Necrotic Tissue: [1:Fat Layer (Subcutaneous Tissue): Yes N/A] Exposed Structures: [1:Fascia: No Tendon: No Muscle: No Joint: No Bone: No None] [N/A:N/A] Epithelialization: [1:Chemical/Enzymatic/Mechanical] [N/A:N/A] Debridement: Pre-procedure Verification/Time Out 08:57 [N/A:N/A] Taken: [1:N/A] [N/A:N/A] Instrument: [1:None] [N/A:N/A] Bleeding: [1:Procedure was tolerated well] [N/A:N/A] Debridement Treatment Response: [1:1.1x1.7x0.4] [N/A:N/A] Post Debridement Measurements L x W x D (cm) [1:0.587] [N/A:N/A] Post Debridement Volume: (cm) [1:Debridement] [N/A:N/A] Treatment Notes Electronic Signature(s) Signed: 10/25/2020 9:16:38 AM By:  Kalman Shan DO Signed: 10/25/2020 5:13:52 PM By: Lorrin Jackson Entered By: Kalman Shan on 10/25/2020 09:09:13 -------------------------------------------------------------------------------- Multi-Disciplinary Care Plan Details Patient Name: Date of Service: [name] [name] 10/25/2020 8:30 A M Medical Record Number: [id number] Patient Account Number: [id number] Date of Birth/Sex: Treating RN: 09-Aug-1945 (75 y.o. [name] Primary Care Arvine Clayburn: Consuello Masse Other Clinician: Referring Loletha Bertini: Treating Zayin Valadez/Extender: Quentin Cornwall in Treatment: 4 Active Inactive Nutrition Nursing Diagnoses: Impaired glucose control: actual or potential Goals: Patient/caregiver verbalizes understanding of need to maintain therapeutic glucose control per primary care physician Date Initiated: 09/27/2020 Target Resolution Date: 11/22/2020 Goal Status: Active Interventions: Assess HgA1c results as ordered upon admission and as needed Assess patient nutrition upon admission and as needed per policy Provide education on elevated blood sugars and impact on wound healing Provide education on nutrition Notes: 10/25/20: Glucose control ongoing. Wound/Skin Impairment Nursing Diagnoses: Impaired tissue integrity Goals: Patient/caregiver will verbalize understanding of skin care regimen Date Initiated: 09/27/2020 Date Inactivated: 10/25/2020 Target Resolution Date: 10/25/2020 Goal Status: Met Ulcer/skin breakdown will have a volume reduction of 30% by week 4 Date Initiated: 09/27/2020 Target Resolution Date: 11/15/2020 Goal Status: Active Interventions: Assess patient/caregiver ability to obtain necessary supplies Assess patient/caregiver ability to perform ulcer/skin care regimen upon admission and as needed Assess ulceration(s) every visit Provide education on ulcer and skin care Treatment Activities: Skin care regimen initiated : 09/27/2020 Topical wound  management initiated : 09/27/2020 Notes: 10/25/20: Wound not yet at 30% volume reduction. Been on antibiotics, Xray obtained and Vascular Referral sent. Target date extended. Electronic Signature(s) Signed: 10/25/2020 8:44:50 AM By: Lorrin Jackson Previous Signature: 10/25/2020 8:43:05 AM Version By: Lorrin Jackson Entered By: Lorrin Jackson on 10/25/2020 08:44:50 -------------------------------------------------------------------------------- Pain Assessment Details Patient Name: Date of Service: [name] [name] 10/25/2020 8:30 A M Medical Record Number: [id number] Patient Account Number: [id number] Date of Birth/Sex: Treating RN: 10-Nov-1945 (75 y.o. [name] Primary Care Tephanie Escorcia: Consuello Masse Other Clinician: Referring Blade Scheff: Treating Celvin Taney/Extender: Quentin Cornwall in Treatment: 4 Active Problems Location of Pain Severity and Description of Pain Patient Has Paino No Site Locations Rate the pain. Rate the pain. Current Pain Level: 0 Pain Management and Medication Current Pain Management: Electronic Signature(s) Signed: 10/25/2020 5:13:52 PM By: Lorrin Jackson Signed: 10/25/2020 5:21:08 PM By: Baruch Gouty RN, BSN Entered By: Baruch Gouty on 10/25/2020 08:23:43 -------------------------------------------------------------------------------- Patient/Caregiver Education Details Patient Name: Date of Service: [name] [name] 4/29/2022andnbsp8:30 [place] Record Number: [id number] Patient Account Number: [id number] Date of Birth/Gender: Treating RN: 08-27-1945 (75 y.o. [name] Primary Care Physician: Consuello Masse Other Clinician: Referring Physician: Treating Physician/Extender: Quentin Cornwall in Treatment: 4 Education Assessment Education Provided To: Patient Education Topics  Provided Elevated Blood Sugar/ Impact on Healing: Methods: Explain/Verbal, Printed Responses: State content  correctly Offloading: Methods: Explain/Verbal, Printed Responses: State content correctly Wound/Skin Impairment: Methods: Explain/Verbal, Printed Responses: State content correctly Electronic Signature(s) Signed: 10/25/2020 5:13:52 PM By: Lorrin Jackson Entered By: Lorrin Jackson on 10/25/2020 08:49:36 -------------------------------------------------------------------------------- Wound Assessment Details Patient Name: Date of Service: [name] [name] 10/25/2020 8:30 A M Medical Record Number: [id number] Patient Account Number: [id number] Date of Birth/Sex: Treating RN: 08-10-1945 (75 y.o. Ernestene Mention Primary Care Ladell Lea: Consuello Masse Other Clinician: Referring Erandy Mceachern: Treating Latera Mclin/Extender: Quentin Cornwall in Treatment: 4 Wound Status Wound Number: 1 Primary Diabetic Wound/Ulcer of the Lower Extremity Etiology: Wound Location: Left, Plantar Metatarsal head fifth Wound Open Wounding Event: Gradually Appeared Status: Date Acquired: 06/29/2020 Comorbid Sleep Apnea, Arrhythmia, Congestive Heart Failure, Coronary Weeks Of Treatment: 4 History: Artery Disease, Type II Diabetes, Osteoarthritis, Neuropathy Clustered Wound: No Photos Wound Measurements Length: (cm) 1.1 Width: (cm) 1.7 Depth: (cm) 0.4 Area: (cm) 1.469 Volume: (cm) 0.587 % Reduction in Area: -1969% % Reduction in Volume: -2695.2% Epithelialization: None Undermining: Yes Starting Position (o'clock): 1 Ending Position (o'clock): 7 Maximum Distance: (cm) 0.7 Wound Description Classification: Grade 2 Wound Margin: Well defined, not attached Exudate Amount: Medium Exudate Type: Serosanguineous Exudate Color: red, brown Foul Odor After Cleansing: No Slough/Fibrino No Wound Bed Granulation Amount: Medium (34-66%) Exposed Structure Granulation Quality: Red Fascia Exposed: No Necrotic Amount: Medium (34-66%) Fat Layer (Subcutaneous Tissue) Exposed: Yes Necrotic  Quality: Eschar Tendon Exposed: No Muscle Exposed: No Joint Exposed: No Bone Exposed: No Treatment Notes Wound #1 (Metatarsal head fifth) Wound Laterality: Plantar, Left Cleanser Soap and Water Discharge Instruction: May shower and wash wound with dial antibacterial soap and water prior to dressing change. Peri-Wound Care Topical Primary Dressing Santyl Ointment Discharge Instruction: Apply nickel thick amount to wound bed as instructed Secondary Dressing Woven Gauze Sponges 2x2 in Discharge Instruction: Apply over primary dressing as directed. Optifoam Non-Adhesive Dressing, 4x4 in Discharge Instruction: Apply foam donut over primary dressing Zetuvit Plus Silicone Border Dressing 4x4 (in/in) Discharge Instruction: Apply silicone border over primary dressing as directed. Secured With Compression Wrap Compression Stockings Add-Ons Notes educated patient and friend how to apply dressing. Electronic Signature(s) Signed: 10/25/2020 4:35:52 PM By: Sandre Kitty Signed: 10/25/2020 5:21:08 PM By: Baruch Gouty RN, BSN Entered By: Sandre Kitty on 10/25/2020 16:10:38 -------------------------------------------------------------------------------- [place] Details Patient Name: Date of Service: [name] [name] 10/25/2020 8:30 A M Medical Record Number: [id number] Patient Account Number: [id number] Date of Birth/Sex: Treating RN: 1946/01/26 (74 y.o. [name] Primary Care Ansar Skoda: Consuello Masse Other Clinician: Referring Torin Modica: Treating Irene Collings/Extender: Quentin Cornwall in Treatment: 4 Vital Signs Time Taken: 08:19 Temperature (F): 97.7 Height (in): 74 Pulse (bpm): 66 Weight (lbs): 238 Respiratory Rate (breaths/min): 17 Body Mass Index (BMI): 30.6 Blood Pressure (mmHg): 91/55 Capillary Blood Glucose (mg/dl): 110 Reference Range: 80 - 120 mg / dl Electronic Signature(s) Signed: 10/25/2020 4:35:52 PM By: Sandre Kitty Entered By:  Sandre Kitty on 10/25/2020 08:20:06

## 2020-10-26 ENCOUNTER — Other Ambulatory Visit: Payer: Self-pay

## 2020-10-26 DIAGNOSIS — S81809A Unspecified open wound, unspecified lower leg, initial encounter: Secondary | ICD-10-CM

## 2020-11-01 ENCOUNTER — Ambulatory Visit (INDEPENDENT_AMBULATORY_CARE_PROVIDER_SITE_OTHER): Payer: Medicare Other | Admitting: Vascular Surgery

## 2020-11-01 ENCOUNTER — Other Ambulatory Visit: Payer: Self-pay

## 2020-11-01 ENCOUNTER — Ambulatory Visit (HOSPITAL_COMMUNITY)
Admission: RE | Admit: 2020-11-01 | Discharge: 2020-11-01 | Disposition: A | Discharge status: Home / Self Care | Payer: Medicare Other | Source: Ambulatory Visit | Attending: Vascular Surgery | Admitting: Vascular Surgery

## 2020-11-01 ENCOUNTER — Encounter: Payer: Self-pay | Admitting: Vascular Surgery

## 2020-11-01 ENCOUNTER — Encounter (HOSPITAL_BASED_OUTPATIENT_CLINIC_OR_DEPARTMENT_OTHER): Payer: Medicare Other | Attending: Internal Medicine | Admitting: Internal Medicine

## 2020-11-01 VITALS — BP 115/62 | HR 59 | Temp 97.8°F | Resp 18 | Ht 74.0 in | Wt 228.0 lb

## 2020-11-01 DIAGNOSIS — I739 Peripheral vascular disease, unspecified: Secondary | ICD-10-CM | POA: Diagnosis not present

## 2020-11-01 DIAGNOSIS — E1142 Type 2 diabetes mellitus with diabetic polyneuropathy: Secondary | ICD-10-CM | POA: Insufficient documentation

## 2020-11-01 DIAGNOSIS — Z20822 Contact with and (suspected) exposure to covid-19: Secondary | ICD-10-CM | POA: Insufficient documentation

## 2020-11-01 DIAGNOSIS — I255 Ischemic cardiomyopathy: Secondary | ICD-10-CM | POA: Insufficient documentation

## 2020-11-01 DIAGNOSIS — S81809A Unspecified open wound, unspecified lower leg, initial encounter: Secondary | ICD-10-CM | POA: Diagnosis not present

## 2020-11-01 DIAGNOSIS — L97528 Non-pressure chronic ulcer of other part of left foot with other specified severity: Secondary | ICD-10-CM | POA: Insufficient documentation

## 2020-11-01 DIAGNOSIS — I509 Heart failure, unspecified: Secondary | ICD-10-CM | POA: Insufficient documentation

## 2020-11-01 DIAGNOSIS — L03116 Cellulitis of left lower limb: Secondary | ICD-10-CM | POA: Insufficient documentation

## 2020-11-01 DIAGNOSIS — E11621 Type 2 diabetes mellitus with foot ulcer: Secondary | ICD-10-CM | POA: Diagnosis not present

## 2020-11-01 DIAGNOSIS — I110 Hypertensive heart disease with heart failure: Secondary | ICD-10-CM | POA: Insufficient documentation

## 2020-11-01 DIAGNOSIS — E1151 Type 2 diabetes mellitus with diabetic peripheral angiopathy without gangrene: Secondary | ICD-10-CM | POA: Insufficient documentation

## 2020-11-01 DIAGNOSIS — L97522 Non-pressure chronic ulcer of other part of left foot with fat layer exposed: Secondary | ICD-10-CM | POA: Diagnosis not present

## 2020-11-01 NOTE — Progress Notes (Signed)
[name] [name] ([id number]) Visit Report for 11/01/2020 Arrival Information Details Patient Name: Date of Service: [name] [name], [name] [name] 11/01/2020 8:30 A M Medical Record Number: [id number] Patient Account Number: [id number] Date of Birth/Sex: Treating RN: 1945/07/08 (75 y.o. [name] [name] Primary Care Glena Pharris: Consuello Masse Other Clinician: Referring Emit Kuenzel: Treating Mann Skaggs/Extender: Zadie Cleverly in Treatment: 5 Visit Information History Since Last Visit Added or deleted any medications: No Patient Arrived: Ambulatory Any new allergies or adverse reactions: No Arrival Time: 08:32 Had a fall or experienced change in No Accompanied By: daughter activities of daily living that may affect Transfer Assistance: None risk of falls: Patient Identification Verified: Yes Signs or symptoms of abuse/neglect since last visito No Secondary Verification Process Completed: Yes Hospitalized since last visit: No Patient Requires Transmission-Based Precautions: No Implantable device outside of the clinic excluding No Patient Has Alerts: No cellular tissue based products placed in the center since last visit: Has Dressing in Place as Prescribed: Yes Pain Present Now: No Electronic Signature(s) Signed: 11/01/2020 4:45:41 PM By: Sandre Kitty Entered By: Sandre Kitty on 11/01/2020 08:33:04 -------------------------------------------------------------------------------- Encounter Discharge Information Details Patient Name: Date of Service: [name] [name] 11/01/2020 8:30 A M Medical Record Number: [id number] Patient Account Number: [id number] Date of Birth/Sex: Treating RN: March 16, 1946 (75 y.o. [name] Primary Care Jelisa [place]: Consuello Masse Other Clinician: Referring Odyn Turko: Treating Sahmya Arai/Extender: Zadie Cleverly in Treatment: 5 Encounter Discharge Information Items Post Procedure Vitals Discharge Condition: Stable Temperature  (F): 98.6 Ambulatory Status: Ambulatory Pulse (bpm): 60 Discharge Destination: Home Respiratory Rate (breaths/min): 17 Transportation: Private Auto Blood Pressure (mmHg): 108/64 Accompanied By: daughter Schedule Follow-up Appointment: Yes Clinical Summary of Care: Electronic Signature(s) Signed: 11/01/2020 5:05:26 PM By: Deon Pilling Entered By: Deon Pilling on 11/01/2020 10:06:01 -------------------------------------------------------------------------------- Lower Extremity Assessment Details Patient Name: Date of Service: [name] [name] 11/01/2020 8:30 A M Medical Record Number: [id number] Patient Account Number: [id number] Date of Birth/Sex: Treating RN: [birth year] (75 y.o. Ernestene Mention Primary Care Emmalynne Courtney: Consuello Masse Other Clinician: Referring Micheline Markes: Treating Treyten Monestime/Extender: Zadie Cleverly in Treatment: 5 Edema Assessment Assessed: Shirlyn Goltz: No] Patrice Paradise: No] Edema: [Left: Ye] [Right: s] Calf Left: Right: Point of Measurement: 40 cm From Medial Instep 36.9 cm Ankle Left: Right: Point of Measurement: 10 cm From Medial Instep 23.5 cm Vascular Assessment Pulses: Dorsalis Pedis Palpable: [Left:Yes] Electronic Signature(s) Signed: 11/01/2020 5:11:03 PM By: Baruch Gouty RN, BSN Entered By: Baruch Gouty on 11/01/2020 08:43:21 -------------------------------------------------------------------------------- Multi Wound Chart Details Patient Name: Date of Service: [name] [name] 11/01/2020 8:30 A M Medical Record Number: [id number] Patient Account Number: [id number] Date of Birth/Sex: Treating RN: [birth year] (74 y.o. [name] [name] Primary Care Cloa Bushong: Consuello Masse Other Clinician: Referring Mccauley Diehl: Treating Jearlean Demauro/Extender: Zadie Cleverly in Treatment: 5 Vital Signs Height(in): 74 Capillary Blood Glucose(mg/dl): 110 Weight(lbs): 238 Pulse(bpm): 3 Body Mass Index(BMI): 31 Blood Pressure(mmHg):  108/64 Temperature(F): 98.3 Respiratory Rate(breaths/min): 17 Photos: [1:No Photos Left, Plantar Metatarsal head fifth] [N/A:N/A N/A] Wound Location: [1:Gradually Appeared] [N/A:N/A] Wounding Event: [1:Diabetic Wound/Ulcer of the Lower] [N/A:N/A] Primary Etiology: [1:Extremity Sleep Apnea, Arrhythmia, Congestive] [N/A:N/A] Comorbid History: [1:Heart Failure, Coronary Artery Disease, Type II Diabetes, Osteoarthritis, Neuropathy 06/29/2020] [N/A:N/A] Date Acquired: [1:5] [N/A:N/A] Weeks of Treatment: [1:Open] [N/A:N/A] Wound Status: [1:1x1.7x0.1] [N/A:N/A] Measurements L x W x D (cm) [1:1.335] [N/A:N/A] A (cm) : rea [1:0.134] [N/A:N/A] Volume (cm) : [1:-1780.30%] [N/A:N/A] % Reduction in A rea: [1:-538.10%] [N/A:N/A] % Reduction in Volume: [1:Grade 2] [N/A:N/A] Classification: [1:Medium] [  N/A:N/A] Exudate A mount: [1:Serosanguineous] [N/A:N/A] Exudate Type: [1:red, brown] [N/A:N/A] Exudate Color: [1:Flat and Intact] [N/A:N/A] Wound Margin: [1:Medium (34-66%)] [N/A:N/A] Granulation A mount: [1:Red] [N/A:N/A] Granulation Quality: [1:Medium (34-66%)] [N/A:N/A] Necrotic A mount: [1:Fat Layer (Subcutaneous Tissue): Yes N/A] Exposed Structures: [1:Fascia: No Tendon: No Muscle: No Joint: No Bone: No None] [N/A:N/A] Epithelialization: [1:Chemical/Enzymatic/Mechanical] [N/A:N/A] Debridement: Pre-procedure Verification/Time Out 08:52 [N/A:N/A] Taken: [1:N/A] [N/A:N/A] Instrument: [1:None] [N/A:N/A] Bleeding: [1:Procedure was tolerated well] [N/A:N/A] Debridement Treatment Response: [1:1x1.7x0.1] [N/A:N/A] Post Debridement Measurements L x W x D (cm) [1:0.134] [N/A:N/A] Post Debridement Volume: (cm) [1:Debridement] [N/A:N/A] Treatment Notes Electronic Signature(s) Signed: 11/01/2020 4:39:58 PM By: Linton Ham MD Signed: 11/01/2020 5:09:19 PM By: Lorrin Jackson Entered By: Linton Ham on 11/01/2020  09:04:08 -------------------------------------------------------------------------------- Multi-Disciplinary Care Plan Details Patient Name: Date of Service: [name] [name] 11/01/2020 8:30 A M Medical Record Number: [id number] Patient Account Number: [id number] Date of Birth/Sex: Treating RN: [birth year] (74 y.o. [name] [name] Primary Care Adaeze Better: Consuello Masse Other Clinician: Referring Zeanna Sunde: Treating Tabari Volkert/Extender: Zadie Cleverly in Treatment: 5 Active Inactive Nutrition Nursing Diagnoses: Impaired glucose control: actual or potential Goals: Patient/caregiver verbalizes understanding of need to maintain therapeutic glucose control per primary care physician Date Initiated: 09/27/2020 Target Resolution Date: 11/22/2020 Goal Status: Active Interventions: Assess HgA1c results as ordered upon admission and as needed Assess patient nutrition upon admission and as needed per policy Provide education on elevated blood sugars and impact on wound healing Provide education on nutrition Notes: 10/25/20: Glucose control ongoing. Wound/Skin Impairment Nursing Diagnoses: Impaired tissue integrity Goals: Patient/caregiver will verbalize understanding of skin care regimen Date Initiated: 09/27/2020 Date Inactivated: 10/25/2020 Target Resolution Date: 10/25/2020 Goal Status: Met Ulcer/skin breakdown will have a volume reduction of 30% by week 4 Date Initiated: 09/27/2020 Target Resolution Date: 11/15/2020 Goal Status: Active Interventions: Assess patient/caregiver ability to obtain necessary supplies Assess patient/caregiver ability to perform ulcer/skin care regimen upon admission and as needed Assess ulceration(s) every visit Provide education on ulcer and skin care Treatment Activities: Skin care regimen initiated : 09/27/2020 Topical wound management initiated : 09/27/2020 Notes: 10/25/20: Wound not yet at 30% volume reduction. Been on antibiotics, Xray  obtained and Vascular Referral sent. Target date extended. Electronic Signature(s) Signed: 11/01/2020 8:50:10 AM By: Lorrin Jackson Entered By: Lorrin Jackson on 11/01/2020 08:50:09 -------------------------------------------------------------------------------- Pain Assessment Details Patient Name: Date of Service: [name] [name] 11/01/2020 8:30 A M Medical Record Number: [id number] Patient Account Number: [id number] Date of Birth/Sex: Treating RN: 11-15-1945 (75 y.o. [name] [name] Primary Care Burna Atlas: Consuello Masse Other Clinician: Referring Janessa Mickle: Treating Damico Partin/Extender: Zadie Cleverly in Treatment: 5 Active Problems Location of Pain Severity and Description of Pain Patient Has Paino No Site Locations Pain Management and Medication Current Pain Management: Electronic Signature(s) Signed: 11/01/2020 4:45:41 PM By: Sandre Kitty Signed: 11/01/2020 5:09:19 PM By: Lorrin Jackson Entered By: Sandre Kitty on 11/01/2020 08:33:25 -------------------------------------------------------------------------------- Patient/Caregiver Education Details Patient Name: Date of Service: [name] [name] 5/6/2022andnbsp8:30 [place] Record Number: [id number] Patient Account Number: [id number] Date of Birth/Gender: Treating RN: March 17, 1946 (75 y.o. [name] [name] Primary Care Physician: Consuello Masse Other Clinician: Referring Physician: Treating Physician/Extender: Zadie Cleverly in Treatment: 5 Education Assessment Education Provided To: Patient Education Topics Provided Elevated Blood Sugar/ Impact on Healing: Methods: Explain/Verbal, Printed Responses: State content correctly Offloading: Methods: Explain/Verbal, Printed Responses: State content correctly Wound/Skin Impairment: Methods: Explain/Verbal, Printed Responses: State content correctly Electronic Signature(s) Signed: 11/01/2020 5:09:19 PM By: Lorrin Jackson Entered By: Onnie Boer,  Jodi on 11/01/2020 08:52:05 -------------------------------------------------------------------------------- Wound Assessment Details Patient Name: Date of Service: GIOVANNI, BIBY 11/01/2020 8:30 A M Medical Record Number: [id number] Patient Account Number: [id number] Date of Birth/Sex: Treating RN: 15-Feb-1946 (75 y.o. [name] [name] Primary Care Razan Siler: Consuello Masse Other Clinician: Referring Bradyn Soward: Treating Margaruite Top/Extender: Zadie Cleverly in Treatment: 5 Wound Status Wound Number: 1 Primary Diabetic Wound/Ulcer of the Lower Extremity Etiology: Wound Location: Left, Plantar Metatarsal head fifth Wound Open Wounding Event: Gradually Appeared Status: Date Acquired: 06/29/2020 Comorbid Sleep Apnea, Arrhythmia, Congestive Heart Failure, Coronary Weeks Of Treatment: 5 History: Artery Disease, Type II Diabetes, Osteoarthritis, Neuropathy Clustered Wound: No Photos Wound Measurements Length: (cm) 1 Width: (cm) 1.7 Depth: (cm) 0.1 Area: (cm) 1.335 Volume: (cm) 0.134 % Reduction in Area: -1780.3% % Reduction in Volume: -538.1% Epithelialization: None Tunneling: No Undermining: No Wound Description Classification: Grade 2 Wound Margin: Flat and Intact Exudate Amount: Medium Exudate Type: Serosanguineous Exudate Color: red, brown Foul Odor After Cleansing: No Slough/Fibrino No Wound Bed Granulation Amount: Medium (34-66%) Exposed Structure Granulation Quality: Red Fascia Exposed: No Necrotic Amount: Medium (34-66%) Fat Layer (Subcutaneous Tissue) Exposed: Yes Necrotic Quality: Adherent Slough Tendon Exposed: No Muscle Exposed: No Joint Exposed: No Bone Exposed: No Treatment Notes Wound #1 (Metatarsal head fifth) Wound Laterality: Plantar, Left Cleanser Soap and Water Discharge Instruction: May shower and wash wound with dial antibacterial soap and water prior to dressing change. Peri-Wound  Care Topical Primary Dressing Santyl Ointment Discharge Instruction: Apply nickel thick amount to wound bed as instructed Secondary Dressing Woven Gauze Sponges 2x2 in Discharge Instruction: Apply over primary dressing as directed. Zetuvit Plus Silicone Border Dressing 4x4 (in/in) Discharge Instruction: Apply silicone border over primary dressing as directed. Secured With Compression Wrap Compression Stockings Environmental education officer) Signed: 11/01/2020 4:45:41 PM By: Sandre Kitty Signed: 11/01/2020 5:09:19 PM By: Lorrin Jackson Entered By: Sandre Kitty on 11/01/2020 16:11:41 -------------------------------------------------------------------------------- [place] Details Patient Name: Date of Service: [name] [name] 11/01/2020 8:30 A M Medical Record Number: [id number] Patient Account Number: [id number] Date of Birth/Sex: Treating RN: 1946/03/15 (75 y.o. [name] [name] Primary Care Jayd Cadieux: Consuello Masse Other Clinician: Referring Dellie Piasecki: Treating Caliann Leckrone/Extender: Zadie Cleverly in Treatment: 5 Vital Signs Time Taken: 08:33 Temperature (F): 98.3 Height (in): 74 Pulse (bpm): 60 Weight (lbs): 238 Respiratory Rate (breaths/min): 17 Body Mass Index (BMI): 30.6 Blood Pressure (mmHg): 108/64 Capillary Blood Glucose (mg/dl): 110 Reference Range: 80 - 120 mg / dl Electronic Signature(s) Signed: 11/01/2020 4:45:41 PM By: Sandre Kitty Entered By: Sandre Kitty on 11/01/2020 08:33:19

## 2020-11-01 NOTE — Progress Notes (Signed)
TIP, ATIENZA ([id number]) Visit Report for 11/01/2020 Debridement Details Patient Name: Date of Service: BURREL, LEGRAND 11/01/2020 8:30 A M Medical Record Number: [id number] Patient Account Number: [id number] Date of Birth/Sex: Treating RN: [birth year] (75 y.o. [name] Primary Care Provider: Consuello Masse Other Clinician: Referring Provider: Treating Provider/Extender: Zadie Cleverly in Treatment: 5 Debridement Performed for Assessment: Wound #1 Left,Plantar Metatarsal head fifth Performed By: Physician Ricard Dillon., MD Debridement Type: Chemical/Enzymatic/Mechanical Agent Used: Santyl Severity of Tissue Pre Debridement: Fat layer exposed Level of Consciousness (Pre-procedure): Awake and Alert Pre-procedure Verification/Time Out Yes - 08:52 Taken: Start Time: 08:53 Bleeding: None End Time: 08:54 Response to Treatment: Procedure was tolerated well Level of Consciousness (Post- Awake and Alert procedure): Post Debridement Measurements of Total Wound Length: (cm) 1 Width: (cm) 1.7 Depth: (cm) 0.1 Volume: (cm) 0.134 Character of Wound/Ulcer Post Debridement: Stable Severity of Tissue Post Debridement: Fat layer exposed Post Procedure Diagnosis Same as Pre-procedure Electronic Signature(s) Signed: 11/01/2020 4:39:58 PM By: Linton Ham MD Signed: 11/01/2020 5:09:19 PM By: Lorrin Jackson Entered By: Lorrin Jackson on 11/01/2020 08:54:51 -------------------------------------------------------------------------------- HPI Details Patient Name: Date of Service: [name] 11/01/2020 8:30 A M Medical Record Number: [id number] Patient Account Number: [id number] Date of Birth/Sex: Treating RN: [birth year] (75 y.o. [name] Primary Care Provider: Consuello Masse Other Clinician: Referring Provider: Treating Provider/Extender: Zadie Cleverly in Treatment: 5 History of Present Illness HPI Description:  ADMISSION 09/27/2020 This is a 75 year old man who lives in [place]. He apparently has had callus over the plantar fifth metatarsal head in the past for which she is followed by podiatry in [place]. They shaved down the callus and this was done in January. He states that he went to [place] in January and became aware of when he was there of an open wound in this area. He followed up with podiatry and has been soaking this twice a day with Epson salts and using Silvadene. Apparently one of the podiatrist told him he may need surgery because of bone protrusion. Past medical history includes an ischemic cardiomyopathy, type 2 diabetes with peripheral neuropathy, BPH, PVD, orthostatic hypotension, atrial fibrillation, hyperlipidemia and hypertension Arterial studies in 2018 showed a noncompressible ABI on the left. It was again noncompressible today at 1.7 although his pulses are easily palpable 4/8; patient I admitted to the clinic last week. Wound on the plantar left fifth metatarsal head which has been refractory. He also has a bit of subluxation of the bone in the head. He is a type II diabetic with peripheral neuropathy. We used silver collagen after debridement He arrives back in clinic today. He has erythema spreading into the lateral part of the fifth metatarsal head. I removed some undermining tissue from around the wound and clearly there is a connection between the wound and the lateral part of the fifth met head. A culture was done. Area of erythema on the lateral part of the foot was marked. His wife says that has been there since Wednesday 4/15; the patient arrived last week with erythema spreading in the lateral part of the fifth metatarsal head. There was a wound connected with the plantar fifth met head original wound. I gave him empiric Augmentin. Surprisingly the culture I did was negative. We have been using silver alginate. The wound has been open since January. He arrives in  clinic today with the erythema much better 4/22; patient presents today for 1 week follow-up of his  fifth metatarsal head wound. Daughter is present with the patient. She states that the wound has improved significantly since 2 weeks ago. There is no longer redness to the foot and the actual wound bed is smaller. Patient overall feels well and has no complaints today. 4/29; patient presents for 1 week follow-up of his fifth metatarsal head wound. Daughter is present. Patient has been using silver alginate every other day to the wound site. He has tried to relieve pressure with a surgical shoe. He denies signs of infections. 5/6; fifth metatarsal head wound. He has been using silver alginate changed to Santyl last week which he managed to obtain for $21 with a coupon we gave him. He also has an appointment with vein and vascular. I think Dr. Heber [place] referred him. He has noncompressible ABIs. The patient also had an x-ray and that was negative Electronic Signature(s) Signed: 11/01/2020 4:39:58 PM By: Linton Ham MD Entered By: Linton Ham on 11/01/2020 12:51:39 -------------------------------------------------------------------------------- Physical Exam Details Patient Name: Date of Service: [name] 11/01/2020 8:30 A M Medical Record Number: [id number] Patient Account Number: [id number] Date of Birth/Sex: Treating RN: Apr 26, 1946 (75 y.o. [name] Primary Care Provider: Consuello Masse Other Clinician: Referring Provider: Treating Provider/Extender: Zadie Cleverly in Treatment: 5 Constitutional Sitting or standing Blood Pressure is within target range for patient.. Pulse regular and within target range for patient.Marland Kitchen Respirations regular, non-labored and within target range.. Temperature is normal and within the target range for the patient.Marland Kitchen Appears in no distress. Cardiovascular Pedal pulses palpable at the dorsalis pedis and posterior tibial although  not robust. Notes Wound exam; left plantar fifth metatarsal head. This is both plantar and lateral. Tissue looks fairly healthy some debris on the lateral aspect. I did not debride this today. Electronic Signature(s) Signed: 11/01/2020 4:39:58 PM By: Linton Ham MD Entered By: Linton Ham on 11/01/2020 09:07:20 -------------------------------------------------------------------------------- Physician Orders Details Patient Name: Date of Service: [name] 11/01/2020 8:30 A M Medical Record Number: [id number] Patient Account Number: [id number] Date of Birth/Sex: Treating RN: 1945/11/29 (74 y.o. [name] Primary Care Provider: Consuello Masse Other Clinician: Referring Provider: Treating Provider/Extender: Zadie Cleverly in Treatment: 5 Verbal / Phone Orders: No Diagnosis Coding ICD-10 Coding Code Description E11.621 Type 2 diabetes mellitus with foot ulcer L97.528 Non-pressure chronic ulcer of other part of left foot with other specified severity E11.42 Type 2 diabetes mellitus with diabetic polyneuropathy L03.116 Cellulitis of left lower limb Follow-up Appointments ppointment in 1 week. - On 12/12/20 after vacation with Dr. Dellia Nims Return A Bathing/ Shower/ Hygiene May shower and wash wound with soap and water. - on days changing dressing. Do not soak foot. Edema Control - Lymphedema / SCD / Other Elevate legs to the level of the heart or above for 30 minutes daily and/or when sitting, a frequency of: Avoid standing for long periods of time. Off-Loading Open toe surgical shoe to: - With Insert-Reduce pressure and friction to area as much as possible. Additional Orders / Instructions Follow Nutritious Diet Wound Treatment Wound #1 - Metatarsal head fifth Wound Laterality: Plantar, Left Cleanser: Soap and Water 1 x Per Day/15 Days Discharge Instructions: May shower and wash wound with dial antibacterial soap and water prior to dressing  change. Prim Dressing: Santyl Ointment 1 x Per Day/15 Days ary Discharge Instructions: Apply nickel thick amount to wound bed as instructed Secondary Dressing: Woven Gauze Sponges 2x2 in (Generic) 1 x Per MWN/02 Days Discharge Instructions: Apply over  primary dressing as directed. Secondary Dressing: Zetuvit Plus Silicone Border Dressing 4x4 (in/in) (Generic) 1 x Per Day/15 Days Discharge Instructions: Apply silicone border over primary dressing as directed. Electronic Signature(s) Signed: 11/01/2020 4:39:58 PM By: Linton Ham MD Signed: 11/01/2020 5:09:19 PM By: Lorrin Jackson Previous Signature: 11/01/2020 8:50:02 AM Version By: Lorrin Jackson Entered By: Lorrin Jackson on 11/01/2020 09:01:03 -------------------------------------------------------------------------------- Problem List Details Patient Name: Date of Service: [name] 11/01/2020 8:30 A M Medical Record Number: [id number] Patient Account Number: [id number] Date of Birth/Sex: Treating RN: 1946-05-15 (74 y.o. [name] Primary Care Provider: Consuello Masse Other Clinician: Referring Provider: Treating Provider/Extender: Zadie Cleverly in Treatment: 5 Active Problems ICD-10 Encounter Code Description Active Date MDM Diagnosis E11.621 Type 2 diabetes mellitus with foot ulcer 09/27/2020 No Yes L97.528 Non-pressure chronic ulcer of other part of left foot with other specified 09/27/2020 No Yes severity E11.42 Type 2 diabetes mellitus with diabetic polyneuropathy 09/27/2020 No Yes L03.116 Cellulitis of left lower limb 10/04/2020 No Yes Inactive Problems Resolved Problems Electronic Signature(s) Signed: 11/01/2020 4:39:58 PM By: Linton Ham MD Previous Signature: 11/01/2020 8:49:43 AM Version By: Lorrin Jackson Entered By: Linton Ham on 11/01/2020 09:03:58 -------------------------------------------------------------------------------- Progress Note Details Patient Name: Date of  Service: [name] 11/01/2020 8:30 A M Medical Record Number: [id number] Patient Account Number: [id number] Date of Birth/Sex: Treating RN: January 26, 1946 (75 y.o. [name] Primary Care Provider: Consuello Masse Other Clinician: Referring Provider: Treating Provider/Extender: Zadie Cleverly in Treatment: 5 Subjective History of Present Illness (HPI) ADMISSION 09/27/2020 This is a 75 year old man who lives in [place]. He apparently has had callus over the plantar fifth metatarsal head in the past for which she is followed by podiatry in [place]. They shaved down the callus and this was done in January. He states that he went to [place] in January and became aware of when he was there of an open wound in this area. He followed up with podiatry and has been soaking this twice a day with Epson salts and using Silvadene. Apparently one of the podiatrist told him he may need surgery because of bone protrusion. Past medical history includes an ischemic cardiomyopathy, type 2 diabetes with peripheral neuropathy, BPH, PVD, orthostatic hypotension, atrial fibrillation, hyperlipidemia and hypertension Arterial studies in 2018 showed a noncompressible ABI on the left. It was again noncompressible today at 1.7 although his pulses are easily palpable 4/8; patient I admitted to the clinic last week. Wound on the plantar left fifth metatarsal head which has been refractory. He also has a bit of subluxation of the bone in the head. He is a type II diabetic with peripheral neuropathy. We used silver collagen after debridement He arrives back in clinic today. He has erythema spreading into the lateral part of the fifth metatarsal head. I removed some undermining tissue from around the wound and clearly there is a connection between the wound and the lateral part of the fifth met head. A culture was done. Area of erythema on the lateral part of the foot was marked. His wife  says that has been there since Wednesday 4/15; the patient arrived last week with erythema spreading in the lateral part of the fifth metatarsal head. There was a wound connected with the plantar fifth met head original wound. I gave him empiric Augmentin. Surprisingly the culture I did was negative. We have been using silver alginate. The wound has been open since January. He arrives in clinic today  with the erythema much better 4/22; patient presents today for 1 week follow-up of his fifth metatarsal head wound. Daughter is present with the patient. She states that the wound has improved significantly since 2 weeks ago. There is no longer redness to the foot and the actual wound bed is smaller. Patient overall feels well and has no complaints today. 4/29; patient presents for 1 week follow-up of his fifth metatarsal head wound. Daughter is present. Patient has been using silver alginate every other day to the wound site. He has tried to relieve pressure with a surgical shoe. He denies signs of infections. 5/6; fifth metatarsal head wound. He has been using silver alginate changed to Santyl last week which he managed to obtain for $21 with a coupon we gave him. He also has an appointment with vein and vascular. I think Dr. Heber Ivy referred him. He has noncompressible ABIs Objective Constitutional Sitting or standing Blood Pressure is within target range for patient.. Pulse regular and within target range for patient.Marland Kitchen Respirations regular, non-labored and within target range.. Temperature is normal and within the target range for the patient.Marland Kitchen Appears in no distress. Vitals Time Taken: 8:33 AM, Height: 74 in, Weight: 238 lbs, BMI: 30.6, Temperature: 98.3 F, Pulse: 60 bpm, Respiratory Rate: 17 breaths/min, Blood Pressure: 108/64 mmHg, Capillary Blood Glucose: 110 mg/dl. Cardiovascular Pedal pulses palpable at the dorsalis pedis and posterior tibial although not robust. General Notes: Wound exam;  left plantar fifth metatarsal head. This is both plantar and lateral. Tissue looks fairly healthy some debris on the lateral aspect. I did not debride this today. Integumentary (Hair, Skin) Wound #1 status is Open. Original cause of wound was Gradually Appeared. The date acquired was: 06/29/2020. The wound has been in treatment 5 weeks. The wound is located on the Left,Plantar Metatarsal head fifth. The wound measures 1cm length x 1.7cm width x 0.1cm depth; 1.335cm^2 area and 0.134cm^3 volume. There is Fat Layer (Subcutaneous Tissue) exposed. There is no tunneling or undermining noted. There is a medium amount of serosanguineous drainage noted. The wound margin is flat and intact. There is medium (34-66%) red granulation within the wound bed. There is a medium (34-66%) amount of necrotic tissue within the wound bed including Adherent Slough. Assessment Active Problems ICD-10 Type 2 diabetes mellitus with foot ulcer Non-pressure chronic ulcer of other part of left foot with other specified severity Type 2 diabetes mellitus with diabetic polyneuropathy Cellulitis of left lower limb Procedures Wound #1 Pre-procedure diagnosis of Wound #1 is a Diabetic Wound/Ulcer of the Lower Extremity located on the Left,Plantar Metatarsal head fifth .Severity of Tissue Pre Debridement is: Fat layer exposed. There was a Chemical/Enzymatic/Mechanical debridement performed by Ricard Dillon., MD.. Agent used was Santyl. A time out was conducted at 08:52, prior to the start of the procedure. There was no bleeding. The procedure was tolerated well. Post Debridement Measurements: 1cm length x 1.7cm width x 0.1cm depth; 0.134cm^3 volume. Character of Wound/Ulcer Post Debridement is stable. Severity of Tissue Post Debridement is: Fat layer exposed. Post procedure Diagnosis Wound #1: Same as Pre-Procedure Plan Follow-up Appointments: Return Appointment in 1 week. - On 12/12/20 after vacation with Dr. Dellia Nims Bathing/  Shower/ Hygiene: May shower and wash wound with soap and water. - on days changing dressing. Do not soak foot. Edema Control - Lymphedema / SCD / Other: Elevate legs to the level of the heart or above for 30 minutes daily and/or when sitting, a frequency of: Avoid standing for long periods of time.  Off-Loading: Open toe surgical shoe to: - With Insert-Reduce pressure and friction to area as much as possible. Additional Orders / Instructions: Follow Nutritious Diet WOUND #1: - Metatarsal head fifth Wound Laterality: Plantar, Left Cleanser: Soap and Water 1 x Per Day/15 Days Discharge Instructions: May shower and wash wound with dial antibacterial soap and water prior to dressing change. Prim Dressing: Santyl Ointment 1 x Per Day/15 Days ary Discharge Instructions: Apply nickel thick amount to wound bed as instructed Secondary Dressing: Woven Gauze Sponges 2x2 in (Generic) 1 x Per Day/15 Days Discharge Instructions: Apply over primary dressing as directed. Secondary Dressing: Zetuvit Plus Silicone Border Dressing 4x4 (in/in) (Generic) 1 x Per Day/15 Days Discharge Instructions: Apply silicone border over primary dressing as directed. #1 I am going to continue the Santyl change daily 2. He is traveling to [place] on a golf outing. He is not going to play he will try to stay off this and we talked about all of this. Also warned about standing water issues. 3. May need a debridement next time at especially on the most anterior part of the wound. Also wondering about more aggressive offloading Electronic Signature(s) Signed: 11/01/2020 4:39:58 PM By: Linton Ham MD Entered By: Linton Ham on 11/01/2020 09:09:23 -------------------------------------------------------------------------------- SuperBill Details Patient Name: Date of Service: [name] 11/01/2020 Medical Record Number: [id number] Patient Account Number: [id number] Date of Birth/Sex: Treating RN: 08-07-1945 (74 y.o.  [name] Primary Care Provider: Consuello Masse Other Clinician: Referring Provider: Treating Provider/Extender: Zadie Cleverly in Treatment: 5 Diagnosis Coding ICD-10 Codes Code Description [phone] Type 2 diabetes mellitus with foot ulcer L97.528 Non-pressure chronic ulcer of other part of left foot with other specified severity E11.42 Type 2 diabetes mellitus with diabetic polyneuropathy L03.116 Cellulitis of left lower limb Facility Procedures CPT4 Code: [id number] Description: [phone] - DEBRIDE W/O ANES NON SELECT ICD-10 Diagnosis Description L97.528 Non-pressure chronic ulcer of other part of left foot with other specified severi E11.621 Type 2 diabetes mellitus with foot ulcer Modifier: ty Quantity: 1 Physician Procedures : CPT4 Code Description Modifier [id number] - WC PHYS LEVEL 3 - EST PT ICD-10 Diagnosis Description E11.621 Type 2 diabetes mellitus with foot ulcer L97.528 Non-pressure chronic ulcer of other part of left foot with other specified severity Quantity: 1 Electronic Signature(s) Signed: 11/01/2020 4:39:58 PM By: Linton Ham MD Entered By: Linton Ham on 11/01/2020 09:09:54

## 2020-11-01 NOTE — Progress Notes (Signed)
Patient name: [name] [name] MRN: [id number] DOB: 03/04/1946 Sex: male  REASON FOR CONSULT: Nonhealing left foot wound  HPI: [name] [name] is a 75 y.o. male, multiple medical problems including diabetes, atrial fibrillation, coronary artery disease status post CABG, heart failure that presents for evaluation of nonhealing left foot wound.  Patient states the wound started in January of this year.  Ultimately he started going to the wound clinic at the beginning of April and sees Dr. Dellia Nims.  He feels this wound has made some progress since seeing Dr. Dellia Nims.  He denies any previous left leg revascularizations.  In addition he has several years of calf claudication whenever he walks and this has limited his ability to walk and play golf.  Past Medical History:  Diagnosis Date  . Arthritis    Knee L - probably  . Atrial fibrillation ([place])   . BPH (benign prostatic hyperplasia)   . CAD (coronary artery disease) 07/06/2011  . CHF (congestive heart failure) ([place])   . Coronary artery disease   . Diabetes mellitus   . Frequency of urination   . Heart failure ([place])   . Heart murmur   . High cholesterol   . History of nuclear stress test 09/2010   dipyridamole; mild perfusion defect due to attenuation with mild superimposed ischemia in apical septal, apical, apical inferior & apical lateral regions; rest LV enlarged in size; prominent gut uptake in infero-apical region; no significant ischemia demonstrated; low risk scan   . HNP (herniated nucleus pulposus), lumbar   . Ischemic cardiomyopathy   . LV dysfunction 07/06/2011  . Nocturia   . Right bundle branch block   . 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 Ellison Hughs  . Sleep apnea    sleep study 10/2010- AHI during total sleep 32.1/hr and during REM 62.3/hr (severe sleep apnea)unable to tolerate c pap  . Spinal stenosis of lumbar region   . Stroke [facility])     [place]     Past Surgical History:  Procedure Laterality Date  . CARDIAC CATHETERIZATION  01/2011   ischemic cardiomyopathy 30-35%, multivessel CAD (Dr. [name])   . CARDIAC CATHETERIZATION  09/13/2015   Procedure: Right/Left Heart Cath and Coronary/Graft Angiography;  Surgeon: [name], MD;  Location: [facility];  Service: Cardiovascular;;  . CARDIOVERSION N/A 05/16/2019   Procedure: CARDIOVERSION;  Surgeon: [name], MD;  Location: [facility] ENDOSCOPY;  Service: Cardiovascular;  Laterality: N/A;  . Colonscopy    . CORONARY ARTERY BYPASS GRAFT  08/03/2011   Procedure: CORONARY ARTERY BYPASS GRAFTING (CABG);  Surgeon: [name], MD;  Location: [facility];  Service: Open Heart Surgery;  Laterality: N/A;  CABG times three using right saphenous vein and left mammary artery usisng endoscope.  Marland Kitchen LEFT HEART CATHETERIZATION WITH CORONARY/GRAFT ANGIOGRAM N/A 09/20/2013   Procedure: LEFT HEART CATHETERIZATION WITH [name];  Surgeon: [name], MD;  Location: [facility] CATH LAB;  Service: Cardiovascular;  Laterality: N/A;  . Lower Extremity Arterial Doppler  03/16/2013   bilat ABIs demonstated normal values; R runoff - posterior tibial & anterior tibial arteries occluded; L runoff - peroneal & posterior tibial arteries occluded, anterior tibial artery appears occluded  . LUMBAR LAMINECTOMY/DECOMPRESSION MICRODISCECTOMY N/A 09/12/2014   Procedure: LUMBAR DECOMPRESSION L3-L4, L4-L5, MICRODISCECTOMY L4-L5;  Surgeon: [name], MD;  Location: [facility];  Service: Orthopedics;  Laterality: N/A;  .  Pilonidal Cyst removed    . TRANSTHORACIC ECHOCARDIOGRAM  11/10/2012   EF 40-45%, mild LVH, mild hypokinesis of anteroseptal myocardium, grade 1 diastolic dysfunction; mild MR & calcifed mitral annulus; LA mild-mod dilated; RA mildly dilated    Family History  Problem Relation Age of Onset  . Anesthesia problems Daughter   . Cancer Mother   . Lung disease Father        & heart  disease  . Colon cancer Sister   . Sudden death Maternal Grandmother     SOCIAL HISTORY: Social History   Socioeconomic History  . Marital status: Widowed    Spouse name: Not on file  . Number of children: 1  . Years of education: Not on file  . Highest education level: Not on file  Occupational History  . Occupation: Clinical cytogeneticist  Tobacco Use  . Smoking status: Former Smoker    Years: 5.00    Types: Cigars    Quit date: 09/07/2011    Years since quitting: 9.1  . Smokeless tobacco: Former Systems developer    Quit date: 02/28/2012  Substance and Sexual Activity  . Alcohol use: Yes    Alcohol/week: 0.0 standard drinks    Comment: occasional  . Drug use: No  . Sexual activity: Not on file  Other Topics Concern  . Not on file  Social History Narrative  . Not on file   Social Determinants of Health   Financial Resource Strain: Not on file  Food Insecurity: Not on file  Transportation Needs: Not on file  Physical Activity: Not on file  Stress: Not on file  Social Connections: Not on file  Intimate Partner Violence: Not on file    Allergies  Allergen Reactions  . Entresto [Sacubitril-Valsartan] Other (See Comments)    dizziness  . Sacubitril     Other reaction(s): Dizziness    Current Outpatient Medications  Medication Sig Dispense Refill  . alfuzosin (UROXATRAL) 10 MG 24 hr tablet Take 1 tablet (10 mg total) by mouth daily with breakfast. 30 tablet 11  . aspirin EC 81 MG tablet Take 81 mg by mouth at bedtime.     . carvedilol (COREG) 6.25 MG tablet Take 1 tablet (6.25 mg total) by mouth 2 (two) times daily. 180 tablet 3  . Cholecalciferol (VITAMIN D) 50 MCG (2000 UT) tablet Take 2,000 Units by mouth at bedtime.    Marland Kitchen ELIQUIS 5 MG TABS tablet Take 1 tablet by mouth twice daily 180 tablet 1  . empagliflozin (JARDIANCE) 10 MG TABS tablet Take 1 tablet (10 mg total) by mouth daily before breakfast. 30 tablet 11  . furosemide (LASIX) 40 MG tablet Take 40 mg by mouth daily.     Marland Kitchen gabapentin (NEURONTIN) 300 MG capsule Take 300 mg by mouth at bedtime.     Marland Kitchen glipiZIDE (GLUCOTROL XL) 10 MG 24 hr tablet Take 10 mg by mouth at bedtime.    . metFORMIN (GLUCOPHAGE-XR) 500 MG 24 hr tablet Take 1,000 mg by mouth 2 (two) times daily.    Glory Rosebush VERIO test strip 1 each daily.    . rosuvastatin (CRESTOR) 5 MG tablet Take 5 mg by mouth every other day.    . tadalafil (CIALIS) 20 MG tablet Take 1 tablet (20 mg total) by mouth as needed. 20 tablet 5  . losartan (COZAAR) 25 MG tablet Take 1 tablet (25 mg total) by mouth daily. 90 tablet 3   No current facility-administered medications for this visit.  REVIEW OF SYSTEMS:  [X]  denotes positive finding, [ ]  denotes negative finding Cardiac  Comments:  Chest pain or chest pressure:    Shortness of breath upon exertion:    Short of breath when lying flat:    Irregular heart rhythm:        Vascular    Pain in calf, thigh, or hip brought on by ambulation: x Left leg  Pain in feet at night that wakes you up from your sleep:     Blood clot in your veins:    Leg swelling:         Pulmonary    Oxygen at home:    Productive cough:     Wheezing:         Neurologic    Sudden weakness in arms or legs:     Sudden numbness in arms or legs:     Sudden onset of difficulty speaking or slurred speech:    Temporary loss of vision in one eye:     Problems with dizziness:         Gastrointestinal    Blood in stool:     Vomited blood:         Genitourinary    Burning when urinating:     Blood in urine:        Psychiatric    Major depression:         Hematologic    Bleeding problems:    Problems with blood clotting too easily:        Skin    Rashes or ulcers:        Constitutional    Fever or chills:      PHYSICAL EXAM: Vitals:   11/01/20 1156  BP: 115/62  Pulse: (!) 59  Resp: 18  Temp: 97.8 F (36.6 C)  TempSrc: Temporal  SpO2: 96%  Weight: 228 lb (103.4 kg)  Height: 6\' 2"  (1.88 m)    GENERAL: The  patient is a well-nourished male, in no acute distress. The vital signs are documented above. CARDIAC: There is a regular rate and rhythm.  VASCULAR:  Palpable femoral pulses bilateral Palpable popliteal pulses bilaterally No palpable pedal pulses Left 5th metatarsal diabetic ulcer PULMONARY: No respiratory distress. ABDOMEN: Soft and non-tender. MUSCULOSKELETAL: There are no major deformities or cyanosis. NEUROLOGIC: No focal weakness or paresthesias are detected. PSYCHIATRIC: The patient has a normal affect.      DATA:   ABIs on the right are 1.31 monophasic and on the left 0.99 monophasic.  Toe pressure on the left severely reduced 32.  Assessment/Plan:  75 year old male with multiple medical comorbidities as noted above that presents with nonhealing left foot wound as pictured above in the left fifth metatarsal consistent with likely diabetes ulceration. He has abnormal monophasic waveforms at the ankle with severely depressed toe pressure of 32 in the left foot.  I discussed with him that this is likely inadequate for wound healing and I recommended aortogram with left leg arteriogram and possible intervention.  We discussed if no endovascular options may ultimately require bypass at a later time.  Have offered to get this scheduled as early as next Thursday.  Ultimately he wants to discuss with Dr. Dellia Nims at his next wound clinic appointment and we will tentatively schedule for Thursday 11/14/20.  Risk benefits discussed.   [name], MD [facility] Office: [phone]

## 2020-11-11 ENCOUNTER — Encounter (HOSPITAL_BASED_OUTPATIENT_CLINIC_OR_DEPARTMENT_OTHER): Payer: Medicare Other | Admitting: Internal Medicine

## 2020-11-11 ENCOUNTER — Telehealth: Payer: Self-pay

## 2020-11-11 ENCOUNTER — Other Ambulatory Visit (HOSPITAL_COMMUNITY)
Admission: RE | Admit: 2020-11-11 | Discharge: 2020-11-11 | Disposition: A | Discharge status: Home / Self Care | Payer: Medicare Other | Source: Ambulatory Visit | Attending: Vascular Surgery | Admitting: Vascular Surgery

## 2020-11-11 ENCOUNTER — Other Ambulatory Visit: Payer: Self-pay

## 2020-11-11 DIAGNOSIS — I509 Heart failure, unspecified: Secondary | ICD-10-CM | POA: Diagnosis not present

## 2020-11-11 DIAGNOSIS — Z01812 Encounter for preprocedural laboratory examination: Secondary | ICD-10-CM | POA: Insufficient documentation

## 2020-11-11 DIAGNOSIS — L03116 Cellulitis of left lower limb: Secondary | ICD-10-CM | POA: Diagnosis not present

## 2020-11-11 DIAGNOSIS — L97528 Non-pressure chronic ulcer of other part of left foot with other specified severity: Secondary | ICD-10-CM | POA: Diagnosis not present

## 2020-11-11 DIAGNOSIS — Z20822 Contact with and (suspected) exposure to covid-19: Secondary | ICD-10-CM | POA: Insufficient documentation

## 2020-11-11 DIAGNOSIS — L97522 Non-pressure chronic ulcer of other part of left foot with fat layer exposed: Secondary | ICD-10-CM | POA: Diagnosis not present

## 2020-11-11 DIAGNOSIS — E11621 Type 2 diabetes mellitus with foot ulcer: Secondary | ICD-10-CM | POA: Diagnosis not present

## 2020-11-11 DIAGNOSIS — I110 Hypertensive heart disease with heart failure: Secondary | ICD-10-CM | POA: Diagnosis not present

## 2020-11-11 DIAGNOSIS — E1142 Type 2 diabetes mellitus with diabetic polyneuropathy: Secondary | ICD-10-CM | POA: Diagnosis not present

## 2020-11-11 DIAGNOSIS — I255 Ischemic cardiomyopathy: Secondary | ICD-10-CM | POA: Diagnosis not present

## 2020-11-11 DIAGNOSIS — E1151 Type 2 diabetes mellitus with diabetic peripheral angiopathy without gangrene: Secondary | ICD-10-CM | POA: Diagnosis not present

## 2020-11-11 NOTE — Telephone Encounter (Signed)
Pt called to confirm he is to hold Eliquis prior to surgery this week. Pt wanted to let his cardiologist know this is being held. Pt is going to let his cardiologist know though we discussed they are part of [facility] and are able to view this in Epic. Pt verbalized understanding and has no further questions/concerns at this time.

## 2020-11-12 ENCOUNTER — Telehealth: Payer: Self-pay | Admitting: Cardiology

## 2020-11-12 ENCOUNTER — Telehealth: Payer: Self-pay | Admitting: Internal Medicine

## 2020-11-12 LAB — SARS CORONAVIRUS 2 (TAT 6-24 HRS): SARS Coronavirus 2: NEGATIVE

## 2020-11-12 NOTE — Telephone Encounter (Signed)
1) What problem are you experiencing? BiPAP machine is not working for him and has actually been recalled. Wanting to know with new prescription why company will not give new machine when his current one is 38-77 years old.   2) Who is your medical equipment company? [employer]   Wants to know if switching to another medical equipment company could resolve this issue. States insurance will cover new machine. He has been on a list for over a year and has not been contacted by [employer] about recall to have it replaced. States she is very concerned because this machine not working is beginning to play an effect on his overall health.    Please route to the sleep study assistant.

## 2020-11-12 NOTE — Telephone Encounter (Signed)
Received a message from Estell Harpin from [facility]. She states they are not requiring cardiac clearance and tried explaining this to the patient, but are not sure if he is confused with a urology surgery. She states he was told to hold his blood thinners for a few days and he was concerned about making his cardiologist aware. She state she informed him he can call the office if he wanted to but that he does not need any clearance for the aortogram scheduled this week. Called patient's daughter back and informed her no clearance was required, but that I would send a message to let Dr. Debara Pickett know the patient's blood thinner was held. Patient's daughter verbalized understanding and had no further questions.

## 2020-11-12 NOTE — Telephone Encounter (Signed)
   Primary Cardiologist: [name], MD  Chart reviewed as part of pre-operative protocol coverage.   75 y.o. male with  Coronary artery disease   S/p CABG in 2013  Cath 2017: patent grafts  (HFrEF) heart failure with reduced ejection fraction   Ischemic CM  Echocardiogram 2021: EF 35-40, no valve dz  Paroxysmal atrial fibrillation   Hypertension   Hyperlipidemia   Diabetes mellitus   Peripheral arterial disease   Hx of CVA   OSA  Trifascicular block   Last OV: 09/20/20, Dr. Debara Pickett Procedure: peripheral angiogram Rx: Patient notes his last day taking Eliquis was 11/10/20.  RCRI:  Perioperative Risk of Major Cardiac Event is (%): 11 (high risk) DASI: < 4 METs (functional status is poor)  Patient was contacted 11/12/2020 in reference to pre-operative risk assessment for pending surgery as outlined below.    Since last seen, [name] [name] has not had any chest pain or unusual shortness of breath.  Recommendations: . Please resume Apixaban (Eliquis) as soon as possible after the procedure on 11/14/20. . If patient needs clearance for vascular surgery, he will likely need stress testing to assess perioperative risk.   Please call with questions. Richardson Dopp, PA-C 11/12/2020, 4:16 PM

## 2020-11-12 NOTE — Telephone Encounter (Signed)
   [facility] Pre-operative Risk Assessment    Patient Name: [name] [name]   [facility] STAFF: - Please ensure there is not already an duplicate clearance open for this procedure. - Under Visit Info/Reason for Call, type in Other and utilize the format Clearance MM/DD/YY or Clearance TBD. Do not use dashes or single digits. - If request is for dental extraction, please clarify the # of teeth to be extracted.  Request for surgical clearance:  1. What type of surgery is being performed?    2. When is this surgery scheduled? 11/14/2020   3. What type of clearance is required (medical clearance vs. Pharmacy clearance to hold med vs. Both)?   4. Are there any medications that need to be held prior to surgery and how long?   5. Practice name and name of physician performing surgery? [facility], Dr. Monica Martinez  6. What is the office phone number? [phone]   7.   What is the office fax number?   8.   Anesthesia type (None, local, MAC, general) ?    Selena Zobro 11/12/2020, 2:17 PM  _________________________________________________________________   (provider comments below)  Patient's daughter states the patient has a procedure with [facility]. She states she was told they sent the clearance information for Dr. Debara Pickett, but I did not see any notes. She states they have already had him stop his eliquis and she is concerned. She states she is not sure if this is normal, but wants to make sure Dr. Debara Pickett is aware. She states he stopped taking the eliquis yesterday and today. She would like a call back with an update on the patient's clearance.

## 2020-11-13 NOTE — Progress Notes (Signed)
LINKOLN, ALKIRE ([id number]) Visit Report for 11/11/2020 Arrival Information Details Patient Name: Date of Service: [name], [name] 11/11/2020 8:15 A M Medical Record Number: [id number] Patient Account Number: [id number] Date of Birth/Sex: Treating RN: [birth year] (75 y.o. [name] [name] Primary Care [name] [name]: [name] [name] Other Clinician: Referring [name] [name]: Treating [name] [name]/Extender: [name] [name] in Treatment: 6 Visit Information History Since Last Visit Added or deleted any medications: No Patient Arrived: Ambulatory Any new allergies or adverse reactions: No Arrival Time: 08:16 Had a fall or experienced change in No Accompanied By: self activities of daily living that may affect Transfer Assistance: None risk of falls: Patient Identification Verified: Yes Hospitalized since last visit: No Secondary Verification Process Completed: Yes Implantable device outside of the clinic excluding No Patient Requires Transmission-Based Precautions: No cellular tissue based products placed in the center Patient Has Alerts: No since last visit: Has Dressing in Place as Prescribed: Yes Pain Present Now: No Electronic Signature(s) Signed: 11/11/2020 2:51:01 PM By: [name] [name] Entered By: [name] [name] on 11/11/2020 08:17:12 -------------------------------------------------------------------------------- Encounter Discharge Information Details Patient Name: Date of Service: [name] [name] 11/11/2020 8:15 A M Medical Record Number: [id number] Patient Account Number: [id number] Date of Birth/Sex: Treating RN: 1945/10/15 (74 y.o. [name] [name] Primary Care Kelson Queenan: [name] [name] Other Clinician: Referring [name] [name]: Treating [name] [name]/Extender: [name] [name] in Treatment: 6 Encounter Discharge Information Items Post Procedure Vitals Discharge Condition: Stable Temperature (F): 98.4 Ambulatory Status: Ambulatory Pulse (bpm):  61 Discharge Destination: Home Respiratory Rate (breaths/min): 17 Transportation: Private Auto Blood Pressure (mmHg): 106/64 Accompanied By: Daughter Schedule Follow-up Appointment: Yes Clinical Summary of Care: Provided on 11/11/2020 Form Type Recipient Paper Patient Patient Electronic Signature(s) Signed: 11/11/2020 10:31:56 AM By: [name] [name] Entered By: [name] [name] on 11/11/2020 10:31:56 -------------------------------------------------------------------------------- Lower Extremity Assessment Details Patient Name: Date of Service: [name] [name] 11/11/2020 8:15 A M Medical Record Number: [id number] Patient Account Number: [id number] Date of Birth/Sex: Treating RN: [birth year] (75 y.o. Burnadette [name], [name] Primary Care Wahid Holley: [name] [name] Other Clinician: Referring [name] [name]: Treating [name] [name]/Extender: [name] [name] in Treatment: 6 Edema Assessment Assessed: [name] [name]: No] Patrice Paradise: No] Edema: [Left: Ye] [Right: s] Calf Left: Right: Point of Measurement: 40 cm From Medial Instep 36.9 cm Ankle Left: Right: Point of Measurement: 10 cm From Medial Instep 23.5 cm Vascular Assessment Pulses: Dorsalis Pedis Palpable: [Left:Yes] Posterior Tibial Palpable: [Left:Yes] Electronic Signature(s) Signed: 11/11/2020 6:08:57 PM By: [name] Hammock RN Entered By: [name] [name] on 11/11/2020 08:25:55 -------------------------------------------------------------------------------- Multi Wound Chart Details Patient Name: Date of Service: [name] [name] 11/11/2020 8:15 A M Medical Record Number: [id number] Patient Account Number: [id number] Date of Birth/Sex: Treating RN: 1945/08/15 (75 y.o. [name] [name] Primary Care [name] [name]: [name] [name] Other Clinician: Referring [name] [name]: Treating [name] [name]/Extender: [name] [name] in Treatment: 6 Vital Signs Height(in): 74 Capillary Blood Glucose(mg/dl): 142 Weight(lbs):  238 Pulse(bpm): 7 Body Mass Index(BMI): 31 Blood Pressure(mmHg): 106/64 Temperature(F): 98.4 Respiratory Rate(breaths/min): 17 Photos: [1:No Photos Left, Plantar Metatarsal head fifth] [N/A:N/A N/A] Wound Location: [1:Gradually Appeared] [N/A:N/A] Wounding Event: [1:Diabetic Wound/Ulcer of the Lower] [N/A:N/A] Primary Etiology: [1:Extremity Sleep Apnea, Arrhythmia, Congestive] [N/A:N/A] Comorbid History: [1:Heart Failure, Coronary Artery Disease, Type II Diabetes, Osteoarthritis, Neuropathy 06/29/2020] [N/A:N/A] Date Acquired: [1:6] [N/A:N/A] Weeks of Treatment: [1:Open] [N/A:N/A] Wound Status: [1:1.1x2x0.2] [N/A:N/A] Measurements L x W x D (cm) [1:1.728] [N/A:N/A] A (cm) : rea [1:0.346] [N/A:N/A] Volume (cm) : [1:-2333.80%] [N/A:N/A] % Reduction in A [1:rea: -1547.60%] [N/A:N/A] % Reduction in Volume: [1:Grade 2] [  N/A:N/A] Classification: [1:Medium] [N/A:N/A] Exudate A mount: [1:Serosanguineous] [N/A:N/A] Exudate Type: [1:red, brown] [N/A:N/A] Exudate Color: [1:Flat and Intact] [N/A:N/A] Wound Margin: [1:Large (67-100%)] [N/A:N/A] Granulation A mount: [1:Red] [N/A:N/A] Granulation Quality: [1:Small (1-33%)] [N/A:N/A] Necrotic A mount: [1:Fat Layer (Subcutaneous Tissue): Yes N/A] Exposed Structures: [1:Fascia: No Tendon: No Muscle: No Joint: No Bone: No None] [N/A:N/A] Epithelialization: [1:Debridement - Excisional] [N/A:N/A] Debridement: Pre-procedure Verification/Time Out 08:55 [N/A:N/A] Taken: [1:Lidocaine 5% topical ointment] [N/A:N/A] Pain Control: [1:Subcutaneous, Slough] [N/A:N/A] Tissue Debrided: [1:Skin/Subcutaneous Tissue] [N/A:N/A] Level: [1:2.2] [N/A:N/A] Debridement A (sq cm): [1:rea Curette] [N/A:N/A] Instrument: [1:Minimum] [N/A:N/A] Bleeding: [1:Pressure] [N/A:N/A] Hemostasis A chieved: [1:0] [N/A:N/A] Procedural Pain: [1:0] [N/A:N/A] Post Procedural Pain: [1:Procedure was tolerated well] [N/A:N/A] Debridement Treatment Response: [1:1.1x2x0.2]  [N/A:N/A] Post Debridement Measurements L x W x D (cm) [1:0.346] [N/A:N/A] Post Debridement Volume: (cm) [1:Debridement] [N/A:N/A] Treatment Notes Electronic Signature(s) Signed: 11/12/2020 4:16:43 PM By: Linton Ham MD Signed: 11/13/2020 6:30:22 PM By: Levan Hurst RN, BSN Entered By: Linton Ham on 11/11/2020 09:16:11 -------------------------------------------------------------------------------- Multi-Disciplinary Care Plan Details Patient Name: Date of Service: [name] [name] 11/11/2020 8:15 A M Medical Record Number: [id number] Patient Account Number: [id number] Date of Birth/Sex: Treating RN: 11/02/1945 (74 y.o. Ernestene Mention Primary Care Kamon Fahr: [name] [name] Other Clinician: Referring Jontae Adebayo: Treating Sharne Linders/Extender: [name] [name] in Treatment: 6 Active Inactive Nutrition Nursing Diagnoses: Impaired glucose control: actual or potential Goals: Patient/caregiver verbalizes understanding of need to maintain therapeutic glucose control per primary care physician Date Initiated: 09/27/2020 Target Resolution Date: 11/22/2020 Goal Status: Active Interventions: Assess HgA1c results as ordered upon admission and as needed Assess patient nutrition upon admission and as needed per policy Provide education on elevated blood sugars and impact on wound healing Provide education on nutrition Notes: 10/25/20: Glucose control ongoing. Wound/Skin Impairment Nursing Diagnoses: Impaired tissue integrity Goals: Patient/caregiver will verbalize understanding of skin care regimen Date Initiated: 09/27/2020 Date Inactivated: 10/25/2020 Target Resolution Date: 10/25/2020 Goal Status: Met Ulcer/skin breakdown will have a volume reduction of 30% by week 4 Date Initiated: 09/27/2020 Target Resolution Date: 11/15/2020 Goal Status: Active Interventions: Assess patient/caregiver ability to obtain necessary supplies Assess patient/caregiver ability to  perform ulcer/skin care regimen upon admission and as needed Assess ulceration(s) every visit Provide education on ulcer and skin care Treatment Activities: Skin care regimen initiated : 09/27/2020 Topical wound management initiated : 09/27/2020 Notes: 10/25/20: Wound not yet at 30% volume reduction. Been on antibiotics, Xray obtained and Vascular Referral sent. Target date extended. Electronic Signature(s) Signed: 11/11/2020 4:50:33 PM By: Baruch Gouty RN, BSN Entered By: Baruch Gouty on 11/11/2020 08:55:20 -------------------------------------------------------------------------------- Pain Assessment Details Patient Name: Date of Service: [name] [name] 11/11/2020 8:15 A M Medical Record Number: [id number] Patient Account Number: [id number] Date of Birth/Sex: Treating RN: [birth year] (75 y.o. [name] [name] Primary Care Joee Iovine: [name] [name] Other Clinician: Referring Janese Radabaugh: Treating Sharnell Knight/Extender: [name] [name] in Treatment: 6 Active Problems Location of Pain Severity and Description of Pain Patient Has Paino No Site Locations Pain Management and Medication Current Pain Management: Electronic Signature(s) Signed: 11/11/2020 2:51:01 PM By: [name] [name] Signed: 11/13/2020 6:30:22 PM By: Levan Hurst RN, BSN Entered By: [name] [name] on 11/11/2020 08:17:36 -------------------------------------------------------------------------------- Patient/Caregiver Education Details Patient Name: Date of Service: [name] [name] 5/16/2022andnbsp8:15 A M Medical Record Number: [id number] Patient Account Number: [id number] Date of Birth/Gender: Treating RN: Feb 19, 1946 (75 y.o. Ernestene Mention Primary Care Physician: [name] [name] Other Clinician: Referring Physician: Treating Physician/Extender: [name] [name] in Treatment: 6 Education Assessment Education Provided  To: Patient Education Topics Provided Elevated  Blood Sugar/ Impact on Healing: Methods: Explain/Verbal Responses: Reinforcements needed, State content correctly Tissue Oxygenation: Methods: Explain/Verbal Responses: Reinforcements needed, State content correctly Wound/Skin Impairment: Methods: Explain/Verbal Responses: Reinforcements needed, State content correctly Electronic Signature(s) Signed: 11/11/2020 4:50:33 PM By: Baruch Gouty RN, BSN Entered By: Baruch Gouty on 11/11/2020 08:55:48 -------------------------------------------------------------------------------- Wound Assessment Details Patient Name: Date of Service: [name] [name] 11/11/2020 8:15 A M Medical Record Number: [id number] Patient Account Number: [id number] Date of Birth/Sex: Treating RN: April 20, 1946 (75 y.o. [name] [name] Primary Care Zanobia Griebel: [name] [name] Other Clinician: Referring Geovanie Winnett: Treating Manolito Jurewicz/Extender: [name] [name] in Treatment: 6 Wound Status Wound Number: 1 Primary Diabetic Wound/Ulcer of the Lower Extremity Etiology: Wound Location: Left, Plantar Metatarsal head fifth Wound Open Wounding Event: Gradually Appeared Status: Date Acquired: 06/29/2020 Comorbid Sleep Apnea, Arrhythmia, Congestive Heart Failure, Coronary Weeks Of Treatment: 6 History: Artery Disease, Type II Diabetes, Osteoarthritis, Neuropathy Clustered Wound: No Photos Wound Measurements Length: (cm) 1.1 Width: (cm) 2 Depth: (cm) 0.2 Area: (cm) 1.728 Volume: (cm) 0.346 % Reduction in Area: -2333.8% % Reduction in Volume: -1547.6% Epithelialization: None Tunneling: No Undermining: No Wound Description Classification: Grade 2 Wound Margin: Flat and Intact Exudate Amount: Medium Exudate Type: Serosanguineous Exudate Color: red, brown Foul Odor After Cleansing: No Slough/Fibrino No Wound Bed Granulation Amount: Large (67-100%) Exposed Structure Granulation Quality: Red Fascia Exposed: No Necrotic Amount: Small  (1-33%) Fat Layer (Subcutaneous Tissue) Exposed: Yes Necrotic Quality: Adherent Slough Tendon Exposed: No Muscle Exposed: No Joint Exposed: No Bone Exposed: No Treatment Notes Wound #1 (Metatarsal head fifth) Wound Laterality: Plantar, Left Cleanser Soap and Water Discharge Instruction: May shower and wash wound with dial antibacterial soap and water prior to dressing change. Peri-Wound Care Topical Primary Dressing KerraCel Ag Gelling Fiber Dressing, 2x2 in (silver alginate) Discharge Instruction: Apply silver alginate to wound bed as instructed Secondary Dressing Woven Gauze Sponges 2x2 in Discharge Instruction: Apply over primary dressing as directed. Zetuvit Plus Silicone Border Dressing 4x4 (in/in) Discharge Instruction: Apply silicone border over primary dressing as directed. Secured With Compression Wrap Compression Stockings Environmental education officer) Signed: 11/11/2020 5:26:16 PM By: [name] [name] Signed: 11/13/2020 6:30:22 PM By: Levan Hurst RN, BSN Entered By: [name] [name] on 11/11/2020 16:53:00 -------------------------------------------------------------------------------- Vitals Details Patient Name: Date of Service: [name] [name] 11/11/2020 8:15 A M Medical Record Number: [id number] Patient Account Number: [id number] Date of Birth/Sex: Treating RN: [birth year] (74 y.o. [name] [name] Primary Care Nyair Depaulo: [name] [name] Other Clinician: Referring Rakeem Colley: Treating Durante Violett/Extender: [name] [name] in Treatment: 6 Vital Signs Time Taken: 08:17 Temperature (F): 98.4 Height (in): 74 Pulse (bpm): 61 Weight (lbs): 238 Respiratory Rate (breaths/min): 17 Body Mass Index (BMI): 30.6 Blood Pressure (mmHg): 106/64 Capillary Blood Glucose (mg/dl): 142 Reference Range: 80 - 120 mg / dl Electronic Signature(s) Signed: 11/11/2020 2:51:01 PM By: [name] [name] Entered By: [name] [name] on 11/11/2020 08:17:28

## 2020-11-13 NOTE — Progress Notes (Signed)
LYNDOL, VANDERHEIDEN ([id number]) Visit Report for 11/11/2020 Debridement Details Patient Name: Date of Service: BROWNING, SOUTHWOOD 11/11/2020 8:15 A M Medical Record Number: [id number] Patient Account Number: [id number] Date of Birth/Sex: Treating RN: 1945/12/17 (75 y.o. Janyth Contes Primary Care Provider: Consuello Masse Other Clinician: Referring Provider: Treating Provider/Extender: Zadie Cleverly in Treatment: 6 Debridement Performed for Assessment: Wound #1 Left,Plantar Metatarsal head fifth Performed By: Physician Ricard Dillon., MD Debridement Type: Debridement Severity of Tissue Pre Debridement: Fat layer exposed Level of Consciousness (Pre-procedure): Awake and Alert Pre-procedure Verification/Time Out Yes - 08:55 Taken: Start Time: 08:56 Pain Control: Lidocaine 5% topical ointment T Area Debrided (L x W): otal 1.1 (cm) x 2 (cm) = 2.2 (cm) Tissue and other material debrided: Viable, Non-Viable, Slough, Subcutaneous, Slough Level: Skin/Subcutaneous Tissue Debridement Description: Excisional Instrument: Curette Bleeding: Minimum Hemostasis Achieved: Pressure End Time: 08:59 Procedural Pain: 0 Post Procedural Pain: 0 Response to Treatment: Procedure was tolerated well Level of Consciousness (Post- Awake and Alert procedure): Post Debridement Measurements of Total Wound Length: (cm) 1.1 Width: (cm) 2 Depth: (cm) 0.2 Volume: (cm) 0.346 Character of Wound/Ulcer Post Debridement: Requires Further Debridement Severity of Tissue Post Debridement: Fat layer exposed Post Procedure Diagnosis Same as Pre-procedure Electronic Signature(s) Signed: 11/12/2020 4:16:43 PM By: Linton Ham MD Signed: 11/13/2020 6:30:22 PM By: Levan Hurst RN, BSN Entered By: Linton Ham on 11/11/2020 09:16:22 -------------------------------------------------------------------------------- HPI Details Patient Name: Date of Service: [name] 11/11/2020 8:15 A  M Medical Record Number: [id number] Patient Account Number: [id number] Date of Birth/Sex: Treating RN: [birth year] (74 y.o. Janyth Contes Primary Care Provider: Consuello Masse Other Clinician: Referring Provider: Treating Provider/Extender: Zadie Cleverly in Treatment: 6 History of Present Illness HPI Description: ADMISSION 09/27/2020 This is a 75 year old man who lives in [place]. He apparently has had callus over the plantar fifth metatarsal head in the past for which she is followed by podiatry in [place]. They shaved down the callus and this was done in January. He states that he went to [place] in January and became aware of when he was there of an open wound in this area. He followed up with podiatry and has been soaking this twice a day with Epson salts and using Silvadene. Apparently one of the podiatrist told him he may need surgery because of bone protrusion. Past medical history includes an ischemic cardiomyopathy, type 2 diabetes with peripheral neuropathy, BPH, PVD, orthostatic hypotension, atrial fibrillation, hyperlipidemia and hypertension Arterial studies in 2018 showed a noncompressible ABI on the left. It was again noncompressible today at 1.7 although his pulses are easily palpable 4/8; patient I admitted to the clinic last week. Wound on the plantar left fifth metatarsal head which has been refractory. He also has a bit of subluxation of the bone in the head. He is a type II diabetic with peripheral neuropathy. We used silver collagen after debridement He arrives back in clinic today. He has erythema spreading into the lateral part of the fifth metatarsal head. I removed some undermining tissue from around the wound and clearly there is a connection between the wound and the lateral part of the fifth met head. A culture was done. Area of erythema on the lateral part of the foot was marked. His wife says that has been there since Wednesday 4/15;  the patient arrived last week with erythema spreading in the lateral part of the fifth metatarsal head. There was a wound connected with the plantar  fifth met head original wound. I gave him empiric Augmentin. Surprisingly the culture I did was negative. We have been using silver alginate. The wound has been open since January. He arrives in clinic today with the erythema much better 4/22; patient presents today for 1 week follow-up of his fifth metatarsal head wound. Daughter is present with the patient. She states that the wound has improved significantly since 2 weeks ago. There is no longer redness to the foot and the actual wound bed is smaller. Patient overall feels well and has no complaints today. 4/29; patient presents for 1 week follow-up of his fifth metatarsal head wound. Daughter is present. Patient has been using silver alginate every other day to the wound site. He has tried to relieve pressure with a surgical shoe. He denies signs of infections. 5/6; fifth metatarsal head wound. He has been using silver alginate changed to Santyl last week which he managed to obtain for $21 with a coupon we gave him. He also has an appointment with vein and vascular. I think Dr. Heber [place] referred him. He has noncompressible ABIs. The patient also had an x-ray and that was negative 5/13; the patient went to see [name]. Areas on the left fifth metatarsal head plantar and lateral. [name] noted that his ABI in the right was 0.99 but monophasic waveforms with a TBI at 0.32. He wanted to go ahead with an angiogram which is booked for Thursday. I talked to the patient about this and advised him to go forward with this. Electronic Signature(s) Signed: 11/12/2020 4:16:43 PM By: Linton Ham MD Entered By: Linton Ham on 11/11/2020 09:18:27 -------------------------------------------------------------------------------- Physical Exam Details Patient Name: Date of Service: [name]  11/11/2020 8:15 A M Medical Record Number: [id number] Patient Account Number: [id number] Date of Birth/Sex: Treating RN: [birth year] (75 y.o. Janyth Contes Primary Care Provider: Consuello Masse Other Clinician: Referring Provider: Treating Provider/Extender: Zadie Cleverly in Treatment: 6 Constitutional Sitting or standing Blood Pressure is within target range for patient.. Pulse regular and within target range for patient.Marland Kitchen Respirations regular, non-labored and within target range.. Temperature is normal and within the target range for the patient.Marland Kitchen Appears in no distress. Cardiovascular Pedal pulses are palpable. Notes Wound exam; left fifth metatarsal head plantar and lateral certainly no improvement in terms of overall surface area. There is undermining distally towards the fifth toe. No evidence of obvious infection. I used a #5 curette to remove some surface slough. This comes off quite easily. Electronic Signature(s) Signed: 11/12/2020 4:16:43 PM By: Linton Ham MD Entered By: Linton Ham on 11/11/2020 09:19:48 -------------------------------------------------------------------------------- Physician Orders Details Patient Name: Date of Service: [name] 11/11/2020 8:15 A M Medical Record Number: [id number] Patient Account Number: [id number] Date of Birth/Sex: Treating RN: 1945/10/15 (74 y.o. Ernestene Mention Primary Care Provider: Consuello Masse Other Clinician: Referring Provider: Treating Provider/Extender: Zadie Cleverly in Treatment: 6 Verbal / Phone Orders: No Diagnosis Coding ICD-10 Coding Code Description E11.621 Type 2 diabetes mellitus with foot ulcer L97.528 Non-pressure chronic ulcer of other part of left foot with other specified severity E11.42 Type 2 diabetes mellitus with diabetic polyneuropathy L03.116 Cellulitis of left lower limb Follow-up Appointments ppointment in 1 week. - Dr. Dellia Nims Return  A Bathing/ Shower/ Hygiene May shower and wash wound with soap and water. - on days changing dressing. Do not soak foot. Edema Control - Lymphedema / SCD / Other Elevate legs to the level of the heart or above  for 30 minutes daily and/or when sitting, a frequency of: Avoid standing for long periods of time. Off-Loading Open toe surgical shoe to: - With Insert-Reduce pressure and friction to area as much as possible. Additional Orders / Instructions Follow Nutritious Diet Wound Treatment Wound #1 - Metatarsal head fifth Wound Laterality: Plantar, Left Cleanser: Soap and Water 1 x Per Day/15 Days Discharge Instructions: May shower and wash wound with dial antibacterial soap and water prior to dressing change. Prim Dressing: KerraCel Ag Gelling Fiber Dressing, 2x2 in (silver alginate) 1 x Per Day/15 Days ary Discharge Instructions: Apply silver alginate to wound bed as instructed Secondary Dressing: Woven Gauze Sponges 2x2 in (Generic) 1 x Per Day/15 Days Discharge Instructions: Apply over primary dressing as directed. Secondary Dressing: Zetuvit Plus Silicone Border Dressing 4x4 (in/in) (Generic) 1 x Per Day/15 Days Discharge Instructions: Apply silicone border over primary dressing as directed. Electronic Signature(s) Signed: 11/11/2020 4:50:33 PM By: Baruch Gouty RN, BSN Signed: 11/12/2020 4:16:43 PM By: Linton Ham MD Entered By: Baruch Gouty on 11/11/2020 09:02:57 -------------------------------------------------------------------------------- Problem List Details Patient Name: Date of Service: [name] 11/11/2020 8:15 A M Medical Record Number: [id number] Patient Account Number: [id number] Date of Birth/Sex: Treating RN: 26-Jul-1945 (74 y.o. Ernestene Mention Primary Care Provider: Consuello Masse Other Clinician: Referring Provider: Treating Provider/Extender: Zadie Cleverly in Treatment: 6 Active Problems ICD-10 Encounter Code Description  Active Date MDM Diagnosis E11.621 Type 2 diabetes mellitus with foot ulcer 09/27/2020 No Yes L97.528 Non-pressure chronic ulcer of other part of left foot with other specified 09/27/2020 No Yes severity E11.42 Type 2 diabetes mellitus with diabetic polyneuropathy 09/27/2020 No Yes L03.116 Cellulitis of left lower limb 10/04/2020 No Yes E11.51 Type 2 diabetes mellitus with diabetic peripheral angiopathy without gangrene 11/11/2020 No Yes Inactive Problems Resolved Problems Electronic Signature(s) Signed: 11/12/2020 4:16:43 PM By: Linton Ham MD Entered By: Linton Ham on 11/11/2020 09:16:04 -------------------------------------------------------------------------------- Progress Note Details Patient Name: Date of Service: [name] 11/11/2020 8:15 A M Medical Record Number: [id number] Patient Account Number: [id number] Date of Birth/Sex: Treating RN: 1946-01-30 (75 y.o. Janyth Contes Primary Care Provider: Consuello Masse Other Clinician: Referring Provider: Treating Provider/Extender: Zadie Cleverly in Treatment: 6 Subjective History of Present Illness (HPI) ADMISSION 09/27/2020 This is a 75 year old man who lives in [place]. He apparently has had callus over the plantar fifth metatarsal head in the past for which she is followed by podiatry in [place]. They shaved down the callus and this was done in January. He states that he went to [place] in January and became aware of when he was there of an open wound in this area. He followed up with podiatry and has been soaking this twice a day with Epson salts and using Silvadene. Apparently one of the podiatrist told him he may need surgery because of bone protrusion. Past medical history includes an ischemic cardiomyopathy, type 2 diabetes with peripheral neuropathy, BPH, PVD, orthostatic hypotension, atrial fibrillation, hyperlipidemia and hypertension Arterial studies in 2018 showed a noncompressible  ABI on the left. It was again noncompressible today at 1.7 although his pulses are easily palpable 4/8; patient I admitted to the clinic last week. Wound on the plantar left fifth metatarsal head which has been refractory. He also has a bit of subluxation of the bone in the head. He is a type II diabetic with peripheral neuropathy. We used silver collagen after debridement He arrives back in clinic today. He has erythema spreading  into the lateral part of the fifth metatarsal head. I removed some undermining tissue from around the wound and clearly there is a connection between the wound and the lateral part of the fifth met head. A culture was done. Area of erythema on the lateral part of the foot was marked. His wife says that has been there since Wednesday 4/15; the patient arrived last week with erythema spreading in the lateral part of the fifth metatarsal head. There was a wound connected with the plantar fifth met head original wound. I gave him empiric Augmentin. Surprisingly the culture I did was negative. We have been using silver alginate. The wound has been open since January. He arrives in clinic today with the erythema much better 4/22; patient presents today for 1 week follow-up of his fifth metatarsal head wound. Daughter is present with the patient. She states that the wound has improved significantly since 2 weeks ago. There is no longer redness to the foot and the actual wound bed is smaller. Patient overall feels well and has no complaints today. 4/29; patient presents for 1 week follow-up of his fifth metatarsal head wound. Daughter is present. Patient has been using silver alginate every other day to the wound site. He has tried to relieve pressure with a surgical shoe. He denies signs of infections. 5/6; fifth metatarsal head wound. He has been using silver alginate changed to Santyl last week which he managed to obtain for $21 with a coupon we gave him. He also has an  appointment with vein and vascular. I think Dr. Heber Hamilton referred him. He has noncompressible ABIs. The patient also had an x-ray and that was negative 5/13; the patient went to see [name]. Areas on the left fifth metatarsal head plantar and lateral. [name] noted that his ABI in the right was 0.99 but monophasic waveforms with a TBI at 0.32. He wanted to go ahead with an angiogram which is booked for Thursday. I talked to the patient about this and advised him to go forward with this. Objective Constitutional Sitting or standing Blood Pressure is within target range for patient.. Pulse regular and within target range for patient.Marland Kitchen Respirations regular, non-labored and within target range.. Temperature is normal and within the target range for the patient.Marland Kitchen Appears in no distress. Vitals Time Taken: 8:17 AM, Height: 74 in, Weight: 238 lbs, BMI: 30.6, Temperature: 98.4 F, Pulse: 61 bpm, Respiratory Rate: 17 breaths/min, Blood Pressure: 106/64 mmHg, Capillary Blood Glucose: 142 mg/dl. Cardiovascular Pedal pulses are palpable. General Notes: Wound exam; left fifth metatarsal head plantar and lateral certainly no improvement in terms of overall surface area. There is undermining distally towards the fifth toe. No evidence of obvious infection. I used a #5 curette to remove some surface slough. This comes off quite easily. Integumentary (Hair, Skin) Wound #1 status is Open. Original cause of wound was Gradually Appeared. The date acquired was: 06/29/2020. The wound has been in treatment 6 weeks. The wound is located on the Left,Plantar Metatarsal head fifth. The wound measures 1.1cm length x 2cm width x 0.2cm depth; 1.728cm^2 area and 0.346cm^3 volume. There is Fat Layer (Subcutaneous Tissue) exposed. There is no tunneling or undermining noted. There is a medium amount of serosanguineous drainage noted. The wound margin is flat and intact. There is large (67-100%) red granulation within the wound  bed. There is a small (1-33%) amount of necrotic tissue within the wound bed including Adherent Slough. Assessment Active Problems ICD-10 Type 2 diabetes mellitus with foot  ulcer Non-pressure chronic ulcer of other part of left foot with other specified severity Type 2 diabetes mellitus with diabetic polyneuropathy Cellulitis of left lower limb Type 2 diabetes mellitus with diabetic peripheral angiopathy without gangrene Procedures Wound #1 Pre-procedure diagnosis of Wound #1 is a Diabetic Wound/Ulcer of the Lower Extremity located on the Left,Plantar Metatarsal head fifth .Severity of Tissue Pre Debridement is: Fat layer exposed. There was a Excisional Skin/Subcutaneous Tissue Debridement with a total area of 2.2 sq cm performed by Ricard Dillon., MD. With the following instrument(s): Curette to remove Viable and Non-Viable tissue/material. Material removed includes Subcutaneous Tissue and Slough and after achieving pain control using Lidocaine 5% topical ointment. No specimens were taken. A time out was conducted at 08:55, prior to the start of the procedure. A Minimum amount of bleeding was controlled with Pressure. The procedure was tolerated well with a pain level of 0 throughout and a pain level of 0 following the procedure. Post Debridement Measurements: 1.1cm length x 2cm width x 0.2cm depth; 0.346cm^3 volume. Character of Wound/Ulcer Post Debridement requires further debridement. Severity of Tissue Post Debridement is: Fat layer exposed. Post procedure Diagnosis Wound #1: Same as Pre-Procedure Plan Follow-up Appointments: Return Appointment in 1 week. - Dr. Dellia Nims Bathing/ Shower/ Hygiene: May shower and wash wound with soap and water. - on days changing dressing. Do not soak foot. Edema Control - Lymphedema / SCD / Other: Elevate legs to the level of the heart or above for 30 minutes daily and/or when sitting, a frequency of: Avoid standing for long periods of  time. Off-Loading: Open toe surgical shoe to: - With Insert-Reduce pressure and friction to area as much as possible. Additional Orders / Instructions: Follow Nutritious Diet WOUND #1: - Metatarsal head fifth Wound Laterality: Plantar, Left Cleanser: Soap and Water 1 x Per Day/15 Days Discharge Instructions: May shower and wash wound with dial antibacterial soap and water prior to dressing change. Prim Dressing: KerraCel Ag Gelling Fiber Dressing, 2x2 in (silver alginate) 1 x Per Day/15 Days ary Discharge Instructions: Apply silver alginate to wound bed as instructed Secondary Dressing: Woven Gauze Sponges 2x2 in (Generic) 1 x Per Day/15 Days Discharge Instructions: Apply over primary dressing as directed. Secondary Dressing: Zetuvit Plus Silicone Border Dressing 4x4 (in/in) (Generic) 1 x Per Day/15 Days Discharge Instructions: Apply silicone border over primary dressing as directed. 1. I change the primary dressing from Santyl to silver alginate. The areas were becoming somewhat macerated around the edges 2. He has undermining superiorly 3. He is going for angiography on Thursday. We talked about this. He probably has some degree of claudication as well. Hopefully interventions can be arranged. 4. If they are able to revascularize him I think a total contact cast might be in order. 5. No evidence of infection Electronic Signature(s) Signed: 11/12/2020 4:16:43 PM By: Linton Ham MD Entered By: Linton Ham on 11/11/2020 09:21:04 -------------------------------------------------------------------------------- SuperBill Details Patient Name: Date of Service: [name] 11/11/2020 Medical Record Number: [id number] Patient Account Number: [id number] Date of Birth/Sex: Treating RN: 1946/01/02 (74 y.o. Ernestene Mention Primary Care Provider: Consuello Masse Other Clinician: Referring Provider: Treating Provider/Extender: Zadie Cleverly in Treatment:  6 Diagnosis Coding ICD-10 Codes Code Description [phone] Type 2 diabetes mellitus with foot ulcer L97.528 Non-pressure chronic ulcer of other part of left foot with other specified severity E11.42 Type 2 diabetes mellitus with diabetic polyneuropathy L03.116 Cellulitis of left lower limb Facility Procedures CPT4 Code: [id number] Description: [place]  TISSUE 20 SQ CM/< ICD-10 Diagnosis Description L97.528 Non-pressure chronic ulcer of other part of left foot with other specified seve Modifier: rity Quantity: 1 Physician Procedures Electronic Signature(s) Signed: 11/12/2020 4:16:43 PM By: Linton Ham MD Entered By: Linton Ham on 11/11/2020 09:21:23

## 2020-11-14 ENCOUNTER — Encounter (HOSPITAL_COMMUNITY)
Admission: RE | Disposition: A | Discharge status: Home / Self Care | Payer: Self-pay | Source: Home / Self Care | Attending: Vascular Surgery

## 2020-11-14 ENCOUNTER — Ambulatory Visit (HOSPITAL_COMMUNITY)
Admission: RE | Admit: 2020-11-14 | Discharge: 2020-11-14 | Disposition: A | Discharge status: Home / Self Care | Payer: Medicare Other | Attending: Vascular Surgery | Admitting: Vascular Surgery

## 2020-11-14 ENCOUNTER — Other Ambulatory Visit: Payer: Self-pay

## 2020-11-14 DIAGNOSIS — E11621 Type 2 diabetes mellitus with foot ulcer: Secondary | ICD-10-CM | POA: Insufficient documentation

## 2020-11-14 DIAGNOSIS — E1151 Type 2 diabetes mellitus with diabetic peripheral angiopathy without gangrene: Secondary | ICD-10-CM | POA: Insufficient documentation

## 2020-11-14 DIAGNOSIS — Z888 Allergy status to other drugs, medicaments and biological substances status: Secondary | ICD-10-CM | POA: Diagnosis not present

## 2020-11-14 DIAGNOSIS — Z87891 Personal history of nicotine dependence: Secondary | ICD-10-CM | POA: Insufficient documentation

## 2020-11-14 DIAGNOSIS — Z8673 Personal history of transient ischemic attack (TIA), and cerebral infarction without residual deficits: Secondary | ICD-10-CM | POA: Diagnosis not present

## 2020-11-14 DIAGNOSIS — Z8249 Family history of ischemic heart disease and other diseases of the circulatory system: Secondary | ICD-10-CM | POA: Insufficient documentation

## 2020-11-14 DIAGNOSIS — E7800 Pure hypercholesterolemia, unspecified: Secondary | ICD-10-CM | POA: Diagnosis not present

## 2020-11-14 DIAGNOSIS — I255 Ischemic cardiomyopathy: Secondary | ICD-10-CM | POA: Insufficient documentation

## 2020-11-14 DIAGNOSIS — I70245 Atherosclerosis of native arteries of left leg with ulceration of other part of foot: Secondary | ICD-10-CM | POA: Insufficient documentation

## 2020-11-14 DIAGNOSIS — I509 Heart failure, unspecified: Secondary | ICD-10-CM | POA: Diagnosis not present

## 2020-11-14 DIAGNOSIS — Z7982 Long term (current) use of aspirin: Secondary | ICD-10-CM | POA: Diagnosis not present

## 2020-11-14 DIAGNOSIS — Z79899 Other long term (current) drug therapy: Secondary | ICD-10-CM | POA: Diagnosis not present

## 2020-11-14 DIAGNOSIS — I4891 Unspecified atrial fibrillation: Secondary | ICD-10-CM | POA: Insufficient documentation

## 2020-11-14 DIAGNOSIS — Z7901 Long term (current) use of anticoagulants: Secondary | ICD-10-CM | POA: Insufficient documentation

## 2020-11-14 DIAGNOSIS — L97529 Non-pressure chronic ulcer of other part of left foot with unspecified severity: Secondary | ICD-10-CM | POA: Insufficient documentation

## 2020-11-14 DIAGNOSIS — I739 Peripheral vascular disease, unspecified: Secondary | ICD-10-CM

## 2020-11-14 DIAGNOSIS — Z951 Presence of aortocoronary bypass graft: Secondary | ICD-10-CM | POA: Diagnosis not present

## 2020-11-14 DIAGNOSIS — Z7984 Long term (current) use of oral hypoglycemic drugs: Secondary | ICD-10-CM | POA: Diagnosis not present

## 2020-11-14 HISTORY — PX: ABDOMINAL AORTOGRAM W/LOWER EXTREMITY: CATH118223

## 2020-11-14 HISTORY — PX: PERIPHERAL VASCULAR BALLOON ANGIOPLASTY: CATH118281

## 2020-11-14 LAB — POCT I-STAT, CHEM 8
BUN: 22 mg/dL (ref 8–23)
Calcium, Ion: 1.13 mmol/L — ABNORMAL LOW (ref 1.15–1.40)
Chloride: 106 mmol/L (ref 98–111)
Creatinine, Ser: 1 mg/dL (ref 0.61–1.24)
Glucose, Bld: 170 mg/dL — ABNORMAL HIGH (ref 70–99)
HCT: 35 % — ABNORMAL LOW (ref 39.0–52.0)
Hemoglobin: 11.9 g/dL — ABNORMAL LOW (ref 13.0–17.0)
Potassium: 4.1 mmol/L (ref 3.5–5.1)
Sodium: 140 mmol/L (ref 135–145)
TCO2: 26 mmol/L (ref 22–32)

## 2020-11-14 LAB — GLUCOSE, CAPILLARY
Glucose-Capillary: 154 mg/dL — ABNORMAL HIGH (ref 70–99)
Glucose-Capillary: 114 mg/dL — ABNORMAL HIGH (ref 70–99)

## 2020-11-14 LAB — POCT ACTIVATED CLOTTING TIME: Activated Clotting Time: 255 seconds

## 2020-11-14 SURGERY — ABDOMINAL AORTOGRAM W/LOWER EXTREMITY
Anesthesia: LOCAL | Laterality: Left

## 2020-11-14 MED ORDER — FENTANYL CITRATE (PF) 100 MCG/2ML IJ SOLN
INTRAMUSCULAR | Status: DC | PRN
  Administered 2020-11-14: 50 ug via INTRAVENOUS

## 2020-11-14 MED ORDER — LABETALOL HCL 5 MG/ML IV SOLN: 10.0000 mg | INTRAVENOUS | Status: DC | PRN

## 2020-11-14 MED ORDER — HEPARIN (PORCINE) IN NACL 1000-0.9 UT/500ML-% IV SOLN
INTRAVENOUS | Status: DC | PRN
  Administered 2020-11-14: 500 mL

## 2020-11-14 MED ORDER — HEPARIN SODIUM (PORCINE) 1000 UNIT/ML IJ SOLN
INTRAMUSCULAR | Status: AC
  Filled 2020-11-14: qty 1

## 2020-11-14 MED ORDER — MIDAZOLAM HCL 2 MG/2ML IJ SOLN
INTRAMUSCULAR | Status: DC | PRN
  Administered 2020-11-14: 2 mg via INTRAVENOUS

## 2020-11-14 MED ORDER — ONDANSETRON HCL 4 MG/2ML IJ SOLN: 4.0000 mg | Freq: Four times a day (QID) | INTRAMUSCULAR | Status: DC | PRN

## 2020-11-14 MED ORDER — SODIUM CHLORIDE 0.9% FLUSH: 3.0000 mL | INTRAVENOUS | Status: DC | PRN

## 2020-11-14 MED ORDER — HEPARIN SODIUM (PORCINE) 1000 UNIT/ML IJ SOLN
INTRAMUSCULAR | Status: DC | PRN
  Administered 2020-11-14: 10000 [IU] via INTRAVENOUS

## 2020-11-14 MED ORDER — LIDOCAINE HCL (PF) 1 % IJ SOLN
INTRAMUSCULAR | Status: AC
  Filled 2020-11-14: qty 30

## 2020-11-14 MED ORDER — SODIUM CHLORIDE 0.9 % IV SOLN: INTRAVENOUS | Status: DC

## 2020-11-14 MED ORDER — LIDOCAINE HCL (PF) 1 % IJ SOLN
INTRAMUSCULAR | Status: DC | PRN
  Administered 2020-11-14: 15 mL

## 2020-11-14 MED ORDER — SODIUM CHLORIDE 0.9 % IV SOLN: 250.0000 mL | INTRAVENOUS | Status: DC | PRN

## 2020-11-14 MED ORDER — IODIXANOL 320 MG/ML IV SOLN
INTRAVENOUS | Status: DC | PRN
  Administered 2020-11-14: 100 mL

## 2020-11-14 MED ORDER — MIDAZOLAM HCL 2 MG/2ML IJ SOLN
INTRAMUSCULAR | Status: AC
  Filled 2020-11-14: qty 2

## 2020-11-14 MED ORDER — HYDRALAZINE HCL 20 MG/ML IJ SOLN: 5.0000 mg | INTRAMUSCULAR | Status: DC | PRN

## 2020-11-14 MED ORDER — ACETAMINOPHEN 325 MG PO TABS: 650.0000 mg | ORAL_TABLET | ORAL | Status: DC | PRN

## 2020-11-14 MED ORDER — FENTANYL CITRATE (PF) 100 MCG/2ML IJ SOLN
INTRAMUSCULAR | Status: AC
  Filled 2020-11-14: qty 2

## 2020-11-14 MED ORDER — HEPARIN (PORCINE) IN NACL 1000-0.9 UT/500ML-% IV SOLN
INTRAVENOUS | Status: AC
  Filled 2020-11-14: qty 1000

## 2020-11-14 MED ORDER — SODIUM CHLORIDE 0.9% FLUSH: 3.0000 mL | Freq: Two times a day (BID) | INTRAVENOUS | Status: DC

## 2020-11-14 SURGICAL SUPPLY — 17 items
CATH CXI SUPP 2.6F 150 ST (CATHETERS) ×3 IMPLANT
CATH OMNI FLUSH 5F 65CM (CATHETERS) ×3 IMPLANT
CATH SOFT-VU 4F 65 STRAIGHT (CATHETERS) ×2 IMPLANT
CATH SOFT-VU STRAIGHT 4F 65CM (CATHETERS) ×1
CATH TEMPO AQUA 5F 100CM (CATHETERS) ×3 IMPLANT
DEVICE VASC CLSR CELT ART 5 (Vascular Products) ×3 IMPLANT
KIT MICROPUNCTURE NIT STIFF (SHEATH) ×3 IMPLANT
KIT PV (KITS) ×3 IMPLANT
SHEATH FLEX ANSEL ANG 5F 45CM (SHEATH) ×3 IMPLANT
SHEATH PINNACLE 5F 10CM (SHEATH) ×3 IMPLANT
SHEATH PROBE COVER 6X72 (BAG) ×3 IMPLANT
SYR MEDRAD MARK V 150ML (SYRINGE) ×3 IMPLANT
TRANSDUCER W/STOPCOCK (MISCELLANEOUS) ×3 IMPLANT
TRAY PV CATH (CUSTOM PROCEDURE TRAY) ×3 IMPLANT
WIRE BENTSON .035X145CM (WIRE) ×3 IMPLANT
WIRE G V18X300CM (WIRE) ×6 IMPLANT
WIRE ROSEN-J .035X260CM (WIRE) ×3 IMPLANT

## 2020-11-14 NOTE — Op Note (Signed)
Patient name: [name] [name] MRN: [id number] DOB: [birth year] Sex: male  11/14/2020 Pre-operative Diagnosis: Left foot wound Post-operative diagnosis:  Same Surgeon:  [name], MD Procedure Performed: 1.  Ultrasound-guided access of right common femoral artery 2.  Aortogram including catheter selection of aorta 3.  Left lower extremity arteriogram with selection of third order branches 4.  Unsuccessful antegrade recanalization of left anterior tibial artery 5.  52 minutes of monitored moderate conscious sedation time 6.  Celt closure of the right common femoral artery  Indications: 75 year old male who was seen in the office last week with a left foot wound.  He had monophasic waveforms at the ankle.  He presents today for left lower extremity arteriogram and possible intervention after risk benefits discussed.  Findings:   Aortogram showed two renal arteries bilaterally with no flow-limiting stenosis.  The infrarenal aorta and both iliacs were widely patent.  Left lower extremity arteriogram showed a patent common femoral, SFA, profunda, above below-knee popliteal artery.  He had significant tibial disease.  The anterior tibial and posterior tibial are patent proximally but then occluded in the mid calf.  He does have inline flow down the left lower extremity through peroneal that is widely patent.  The peroneal does give off collaterals distally to the anterior tibial that fills retrograde at the ankle.  I did attempt antegrade recanalization of the left anterior tibial that was unsuccessful given a heavily calcified artery.  I think the next consideration would be retrograde anterior tibial but given he does have inline flow through the peroneal I will give him time to progress in wound clinic since he feels the wound has made some progress improving.   Procedure:  The patient was identified in the holding area and taken to room 8.  The patient was then placed supine on the  table and prepped and draped in the usual sterile fashion.  A time out was called.  Ultrasound was used to evaluate the right common femoral artery.  It was patent .  A digital ultrasound image was acquired.  A micropuncture needle was used to access the right common femoral artery under ultrasound guidance.  An 018 wire was advanced without resistance and a micropuncture sheath was placed.  The 018 wire was removed and a benson wire was placed.  The micropuncture sheath was exchanged for a 5 french sheath.  An omniflush catheter was advanced over the wire to the level of L-1.  An abdominal angiogram was obtained.  Next, using the omniflush catheter and a benson wire, the aortic bifurcation was crossed and the catheter was placed into theleft external iliac artery and left runoff was obtained.  In order to better evaluate his tibial disease we did put a long Rosen wire down the SFA on the left and placed a long 100 cm straight flush catheter.  I then got some hand injections of the left tibial vessels.  This showed inline flow down the peroneal that was in-line with occluded anterior tibial and posterior tibial artery shortly after takeoff.  I did elect to try and intervene on the anterior tibial given that this artery did reconstitute just above the ankle.  I then placed a long 5 [place] Ansell sheath in the right groin over the aortic bifurcation.  Patient was given 100 units per kg IV heparin.  I then used a CXI catheter with a V 18 to try and cross the anterior tibial occlusion unsuccessful.  Wires and catheters were removed.  A short 5 french sheath was placed in the right common femoral artery.  Celt closure device was put in the right common femoral artery and the sheath was removed.  Plan: We will arrange follow-up in 1 month.  He will continue wound clinic with Dr. Dellia Nims.  If the wound fails to make progress would consider retrograde left AT intervention.  He does have inline flow down the left lower  extremity through the peroneal that is widely patent.   [name], MD [facility] Office: [phone]

## 2020-11-14 NOTE — Progress Notes (Signed)
Up and walked and tolerated well; right groin stable, no bleeding or hematoma 

## 2020-11-14 NOTE — Discharge Instructions (Signed)
Femoral Site Care  This sheet gives you information about how to care for yourself after your procedure. Your health care provider may also give you more specific instructions. If you have problems or questions, contact your health care provider. What can I expect after the procedure? After the procedure, it is common to have:  Bruising that usually fades within 1-2 weeks.  Tenderness at the site. Follow these instructions at home: Wound care  Follow instructions from your health care provider about how to take care of your insertion site. Make sure you: ? Wash your hands with soap and water before you change your bandage (dressing). If soap and water are not available, use hand sanitizer. ? Change your dressing as told by your health care provider. ? Leave stitches (sutures), skin glue, or adhesive strips in place. These skin closures may need to stay in place for 2 weeks or longer. If adhesive strip edges start to loosen and curl up, you may trim the loose edges. Do not remove adhesive strips completely unless your health care provider tells you to do that.  Do not take baths, swim, or use a hot tub until your health care provider approves.  You may shower 24-48 hours after the procedure or as told by your health care provider. ? Gently wash the site with plain soap and water. ? Pat the area dry with a clean towel. ? Do not rub the site. This may cause bleeding.  Do not apply powder or lotion to the site. Keep the site clean and dry.  Check your femoral site every day for signs of infection. Check for: ? Redness, swelling, or pain. ? Fluid or blood. ? Warmth. ? Pus or a bad smell. Activity  For the first 2-3 days after your procedure, or as long as directed: ? Avoid climbing stairs as much as possible. ? Do not squat.  Do not lift anything that is heavier than 10 lb (4.5 kg), or the limit that you are told, until your health care provider says that it is safe.  Rest as  directed. ? Avoid sitting for a long time without moving. Get up to take short walks every 1-2 hours.  Do not drive for 24 hours if you were given a medicine to help you relax (sedative). General instructions  Take over-the-counter and prescription medicines only as told by your health care provider.  Keep all follow-up visits as told by your health care provider. This is important. Contact a health care provider if you have:  A fever or chills.  You have redness, swelling, or pain around your insertion site. Get help right away if:  The catheter insertion area swells very fast.  You pass out.  You suddenly start to sweat or your skin gets clammy.  The catheter insertion area is bleeding, and the bleeding does not stop when you hold steady pressure on the area.  The area near or just beyond the catheter insertion site becomes pale, cool, tingly, or numb. These symptoms may represent a serious problem that is an emergency. Do not wait to see if the symptoms will go away. Get medical help right away. Call your local emergency services (911 in the [place] Do not drive yourself to the hospital. Summary  After the procedure, it is common to have bruising that usually fades within 1-2 weeks.  Check your femoral site every day for signs of infection.  Do not lift anything that is heavier than 10 lb (4.5 kg), or   the limit that you are told, until your health care provider says that it is safe. This information is not intended to replace advice given to you by your health care provider. Make sure you discuss any questions you have with your health care provider. Document Revised: 02/16/2020 Document Reviewed: 02/16/2020 Elsevier Patient Education  2021 [employer]  

## 2020-11-14 NOTE — H&P (Signed)
History and Physical Interval Note:  11/14/2020 11:03 AM  [name] [name]  has presented today for surgery, with the diagnosis of non healing wound.  The various methods of treatment have been discussed with the patient and family. After consideration of risks, benefits and other options for treatment, the patient has consented to  Procedure(s): ABDOMINAL AORTOGRAM W/LOWER EXTREMITY (N/A) as a surgical intervention.  The patient's history has been reviewed, patient examined, no change in status, stable for surgery.  I have reviewed the patient's chart and labs.  Questions were answered to the patient's satisfaction.    Abdominal aortogram, left lower extremity arteriogram, left foot wound.  Cephus Shelling  Patient name: [name] [name]  MRN: [id number]        DOB: [birth year]        Sex: male  REASON FOR CONSULT: Nonhealing left foot wound  HPI: [name] [name] is a 75 y.o. male, multiple medical problems including diabetes, atrial fibrillation, coronary artery disease status post CABG, heart failure that presents for evaluation of nonhealing left foot wound.  Patient states the wound started in January of this year.  Ultimately he started going to the wound clinic at the beginning of April and sees Dr. Leanord Hawking.  He feels this wound has made some progress since seeing Dr. Leanord Hawking.  He denies any previous left leg revascularizations.  In addition he has several years of calf claudication whenever he walks and this has limited his ability to walk and play golf.      Past Medical History:  Diagnosis Date  . Arthritis    Knee L - probably  . Atrial fibrillation (HCC)   . BPH (benign prostatic hyperplasia)   . CAD (coronary artery disease) 07/06/2011  . CHF (congestive heart failure) (HCC)   . Coronary artery disease   . Diabetes mellitus   . Frequency of urination   . Heart failure (HCC)   . Heart murmur   . High cholesterol   . History of nuclear stress test 09/2010    dipyridamole; mild perfusion defect due to attenuation with mild superimposed ischemia in apical septal, apical, apical inferior & apical lateral regions; rest LV enlarged in size; prominent gut uptake in infero-apical region; no significant ischemia demonstrated; low risk scan   . HNP (herniated nucleus pulposus), lumbar   . Ischemic cardiomyopathy   . LV dysfunction 07/06/2011  . Nocturia   . Right bundle branch block   . 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 Wayland Salinas  . Sleep apnea    sleep study 10/2010- AHI during total sleep 32.1/hr and during REM 62.3/hr (severe sleep apnea)unable to tolerate c pap  . Spinal stenosis of lumbar region   . Stroke [facility])    [place]          Past Surgical History:  Procedure Laterality Date  . CARDIAC CATHETERIZATION  01/2011   ischemic cardiomyopathy 30-35%, multivessel CAD (Dr. Bishop Limbo)   . CARDIAC CATHETERIZATION  09/13/2015   Procedure: Right/Left Heart Cath and Coronary/Graft Angiography;  Surgeon: [name], MD;  Location[facility] INVASIVE CV LAB;  Service: Cardiovascular;;  . CARDIOVERSION N/A 05/16/2019   Procedure: CARDIOVERSION;  Surgeon: [name], MD;  Location: [facility] ENDOSCOPY;  Service: Cardiovascular;  Laterality: N/A;  . Colonscopy    . CORONARY ARTERY BYPASS GRAFT  08/03/2011   Procedure: CORONARY ARTERY BYPASS GRAFTING (CABG);  Surgeon:  [name], MD;  Location: [place];  Service: Open Heart Surgery;  Laterality: N/A;  CABG times three using right saphenous vein and left mammary artery usisng endoscope.  Marland Kitchen LEFT HEART CATHETERIZATION WITH CORONARY/GRAFT ANGIOGRAM N/A 09/20/2013   Procedure: LEFT HEART CATHETERIZATION WITH [name];  Surgeon: [name], MD;  Location: [facility] CATH LAB;  Service: Cardiovascular;  Laterality: N/A;  . Lower Extremity Arterial Doppler  03/16/2013   bilat ABIs demonstated normal  values; R runoff - posterior tibial & anterior tibial arteries occluded; L runoff - peroneal & posterior tibial arteries occluded, anterior tibial artery appears occluded  . LUMBAR LAMINECTOMY/DECOMPRESSION MICRODISCECTOMY N/A 09/12/2014   Procedure: LUMBAR DECOMPRESSION L3-L4, L4-L5, MICRODISCECTOMY L4-L5;  Surgeon: [name], MD;  Location: [facility] ORS;  Service: Orthopedics;  Laterality: N/A;  . Pilonidal Cyst removed    . TRANSTHORACIC ECHOCARDIOGRAM  11/10/2012   EF 40-45%, mild LVH, mild hypokinesis of anteroseptal myocardium, grade 1 diastolic dysfunction; mild MR & calcifed mitral annulus; LA mild-mod dilated; RA mildly dilated         Family History  Problem Relation Age of Onset  . Anesthesia problems Daughter   . Cancer Mother   . Lung disease Father        & heart disease  . Colon cancer Sister   . Sudden death Maternal Grandmother     SOCIAL HISTORY: Social History        Socioeconomic History  . Marital status: Widowed    Spouse name: Not on file  . Number of children: 1  . Years of education: Not on file  . Highest education level: Not on file  Occupational History  . Occupation: Clinical cytogeneticist  Tobacco Use  . Smoking status: Former Smoker    Years: 5.00    Types: Cigars    Quit date: 09/07/2011    Years since quitting: 9.1  . Smokeless tobacco: Former Systems developer    Quit date: 02/28/2012  Substance and Sexual Activity  . Alcohol use: Yes    Alcohol/week: 0.0 standard drinks    Comment: occasional  . Drug use: No  . Sexual activity: Not on file  Other Topics Concern  . Not on file  Social History Narrative  . Not on file   Social Determinants of Health   Financial Resource Strain: Not on file  Food Insecurity: Not on file  Transportation Needs: Not on file  Physical Activity: Not on file  Stress: Not on file  Social Connections: Not on file  Intimate Partner Violence: Not on file         Allergies  Allergen  Reactions  . Entresto [Sacubitril-Valsartan] Other (See Comments)    dizziness  . Sacubitril     Other reaction(s): Dizziness    Current Outpatient Medications  Medication Sig Dispense Refill  . alfuzosin (UROXATRAL) 10 MG 24 hr tablet Take 1 tablet (10 mg total) by mouth daily with breakfast. 30 tablet 11  . aspirin EC 81 MG tablet Take 81 mg by mouth at bedtime.     . carvedilol (COREG) 6.25 MG tablet Take 1 tablet (6.25 mg total) by mouth 2 (two) times daily. 180 tablet 3  . Cholecalciferol (VITAMIN D) 50 MCG (2000 UT) tablet Take 2,000 Units by mouth at bedtime.    Marland Kitchen ELIQUIS 5 MG TABS tablet Take 1 tablet by mouth twice daily 180 tablet 1  . empagliflozin (JARDIANCE) 10 MG TABS tablet Take 1 tablet (10 mg total) by mouth daily before breakfast.  30 tablet 11  . furosemide (LASIX) 40 MG tablet Take 40 mg by mouth daily.    Marland Kitchen gabapentin (NEURONTIN) 300 MG capsule Take 300 mg by mouth at bedtime.     Marland Kitchen glipiZIDE (GLUCOTROL XL) 10 MG 24 hr tablet Take 10 mg by mouth at bedtime.    . metFORMIN (GLUCOPHAGE-XR) 500 MG 24 hr tablet Take 1,000 mg by mouth 2 (two) times daily.    Glory Rosebush VERIO test strip 1 each daily.    . rosuvastatin (CRESTOR) 5 MG tablet Take 5 mg by mouth every other day.    . tadalafil (CIALIS) 20 MG tablet Take 1 tablet (20 mg total) by mouth as needed. 20 tablet 5  . losartan (COZAAR) 25 MG tablet Take 1 tablet (25 mg total) by mouth daily. 90 tablet 3   No current facility-administered medications for this visit.    REVIEW OF SYSTEMS:  [X]  denotes positive finding, [ ]  denotes negative finding Cardiac  Comments:  Chest pain or chest pressure:    Shortness of breath upon exertion:    Short of breath when lying flat:    Irregular heart rhythm:        Vascular    Pain in calf, thigh, or hip brought on by ambulation: x Left leg  Pain in feet at night that wakes you up from your sleep:     Blood clot in your veins:     Leg swelling:         Pulmonary    Oxygen at home:    Productive cough:     Wheezing:         Neurologic    Sudden weakness in arms or legs:     Sudden numbness in arms or legs:     Sudden onset of difficulty speaking or slurred speech:    Temporary loss of vision in one eye:     Problems with dizziness:         Gastrointestinal    Blood in stool:     Vomited blood:         Genitourinary    Burning when urinating:     Blood in urine:        Psychiatric    Major depression:         Hematologic    Bleeding problems:    Problems with blood clotting too easily:        Skin    Rashes or ulcers:        Constitutional    Fever or chills:      PHYSICAL EXAM:    Vitals:   11/01/20 1156  BP: 115/62  Pulse: (!) 59  Resp: 18  Temp: 97.8 F (36.6 C)  TempSrc: Temporal  SpO2: 96%  Weight: 228 lb (103.4 kg)  Height: 6\' 2"  (1.88 m)    GENERAL: The patient is a well-nourished male, in no acute distress. The vital signs are documented above. CARDIAC: There is a regular rate and rhythm.  VASCULAR:  Palpable femoral pulses bilateral Palpable popliteal pulses bilaterally No palpable pedal pulses Left 5th metatarsal diabetic ulcer PULMONARY: No respiratory distress. ABDOMEN: Soft and non-tender. MUSCULOSKELETAL: There are no major deformities or cyanosis. NEUROLOGIC: No focal weakness or paresthesias are detected. PSYCHIATRIC: The patient has a normal affect.      DATA:   ABIs on the right are 1.31 monophasic and on the left 0.99 monophasic.  Toe pressure on the left severely reduced 32.  Assessment/Plan:  75 year old male with multiple medical comorbidities as noted above that presents with nonhealing left foot wound as pictured above in the left fifth metatarsal consistent with likely diabetes ulceration. He has abnormal monophasic waveforms at the ankle with severely  depressed toe pressure of 32 in the left foot.  I discussed with him that this is likely inadequate for wound healing and I recommended aortogram with left leg arteriogram and possible intervention.  We discussed if no endovascular options may ultimately require bypass at a later time.  Have offered to get this scheduled as early as next Thursday.  Ultimately he wants to discuss with Dr. Dellia Nims at his next wound clinic appointment and we will tentatively schedule for Thursday 11/14/20.  Risk benefits discussed.   [name], MD [facility] Office: [phone]

## 2020-11-15 ENCOUNTER — Encounter (HOSPITAL_COMMUNITY): Payer: Self-pay | Admitting: Vascular Surgery

## 2020-11-18 ENCOUNTER — Encounter (HOSPITAL_BASED_OUTPATIENT_CLINIC_OR_DEPARTMENT_OTHER): Payer: Medicare Other | Admitting: Internal Medicine

## 2020-11-18 ENCOUNTER — Other Ambulatory Visit: Payer: Self-pay

## 2020-11-18 DIAGNOSIS — E11621 Type 2 diabetes mellitus with foot ulcer: Secondary | ICD-10-CM

## 2020-11-18 DIAGNOSIS — I509 Heart failure, unspecified: Secondary | ICD-10-CM | POA: Diagnosis not present

## 2020-11-18 DIAGNOSIS — I110 Hypertensive heart disease with heart failure: Secondary | ICD-10-CM | POA: Diagnosis not present

## 2020-11-18 DIAGNOSIS — Z20822 Contact with and (suspected) exposure to covid-19: Secondary | ICD-10-CM | POA: Diagnosis not present

## 2020-11-18 DIAGNOSIS — L03116 Cellulitis of left lower limb: Secondary | ICD-10-CM | POA: Diagnosis not present

## 2020-11-18 DIAGNOSIS — L97528 Non-pressure chronic ulcer of other part of left foot with other specified severity: Secondary | ICD-10-CM

## 2020-11-18 NOTE — Progress Notes (Signed)
NIMROD, WENDT ([id number]) Visit Report for 11/18/2020 Arrival Information Details Patient Name: Date of Service: [name] [name], [name] [name] 11/18/2020 9:30 A M Medical Record Number: [id number] Patient Account Number: [id number] Date of Birth/Sex: Treating RN: 27-Oct-1945 (75 y.o. Janyth Contes Primary Care Netty Sullivant: Consuello Masse Other Clinician: Referring Moyinoluwa Dawe: Treating Raynald Rouillard/Extender: Quentin Cornwall in Treatment: 7 Visit Information History Since Last Visit Added or deleted any medications: No Patient Arrived: Ambulatory Any new allergies or adverse reactions: No Arrival Time: 09:43 Had a fall or experienced change in No Accompanied By: wife activities of daily living that may affect Transfer Assistance: None risk of falls: Patient Identification Verified: Yes Signs or symptoms of abuse/neglect since last visito No Secondary Verification Process Completed: Yes Hospitalized since last visit: No Patient Requires Transmission-Based Precautions: No Implantable device outside of the clinic excluding No Patient Has Alerts: No cellular tissue based products placed in the center since last visit: Has Dressing in Place as Prescribed: Yes Pain Present Now: No Electronic Signature(s) Signed: 11/18/2020 3:41:13 PM By: Sandre Kitty Entered By: Sandre Kitty on 11/18/2020 09:43:34 -------------------------------------------------------------------------------- Encounter Discharge Information Details Patient Name: Date of Service: [name] [name] 11/18/2020 9:30 A M Medical Record Number: [id number] Patient Account Number: [id number] Date of Birth/Sex: Treating RN: [birth year] (75 y.o. [name] Primary Care Dailyn Reith: Consuello Masse Other Clinician: Referring Hussain Maimone: Treating Jeray Shugart/Extender: Quentin Cornwall in Treatment: 7 Encounter Discharge Information Items Post Procedure Vitals Discharge Condition: Stable Temperature  (F): 98.4 Ambulatory Status: Ambulatory Pulse (bpm): 60 Discharge Destination: Home Respiratory Rate (breaths/min): 17 Transportation: Private Auto Blood Pressure (mmHg): 125/68 Accompanied By: wife Schedule Follow-up Appointment: Yes Clinical Summary of Care: Provided on 11/18/2020 Form Type Recipient Paper Patient Patient Electronic Signature(s) Signed: 11/18/2020 10:20:03 AM By: Lorrin Jackson Entered By: Lorrin Jackson on 11/18/2020 10:20:03 -------------------------------------------------------------------------------- Lower Extremity Assessment Details Patient Name: Date of Service: [name] [name] 11/18/2020 9:30 A M Medical Record Number: [id number] Patient Account Number: [id number] Date of Birth/Sex: Treating RN: Sep 09, 1945 (75 y.o. Janyth Contes Primary Care Laderrick Wilk: Consuello Masse Other Clinician: Referring Thy Gullikson: Treating Nakeysha Pasqual/Extender: Quentin Cornwall in Treatment: 7 Edema Assessment Assessed: Shirlyn Goltz: No] Patrice Paradise: No] Edema: [Left: Ye] [Right: s] Calf Left: Right: Point of Measurement: 40 cm From Medial Instep 36.9 cm Ankle Left: Right: Point of Measurement: 10 cm From Medial Instep 23.5 cm Vascular Assessment Pulses: Dorsalis Pedis Palpable: [Left:Yes] Electronic Signature(s) Signed: 11/18/2020 5:27:53 PM By: Levan Hurst RN, BSN Entered By: Levan Hurst on 11/18/2020 09:53:30 -------------------------------------------------------------------------------- Multi Wound Chart Details Patient Name: Date of Service: [name] [name] 11/18/2020 9:30 A M Medical Record Number: [id number] Patient Account Number: [id number] Date of Birth/Sex: Treating RN: 09-03-1945 (74 y.o. Janyth Contes Primary Care Willford Rabideau: Consuello Masse Other Clinician: Referring Kayson Tasker: Treating Colton Engdahl/Extender: Quentin Cornwall in Treatment: 7 Vital Signs Height(in): 74 Capillary Blood Glucose(mg/dl): 135 Weight(lbs):  238 Pulse(bpm): 68 Body Mass Index(BMI): 31 Blood Pressure(mmHg): 125/68 Temperature(F): 98.4 Respiratory Rate(breaths/min): 17 Photos: [1:No Photos Left, Plantar Metatarsal head fifth] [N/A:N/A N/A] Wound Location: [1:Gradually Appeared] [N/A:N/A] Wounding Event: [1:Diabetic Wound/Ulcer of the Lower] [N/A:N/A] Primary Etiology: [1:Extremity Sleep Apnea, Arrhythmia, Congestive] [N/A:N/A] Comorbid History: [1:Heart Failure, Coronary Artery Disease, Type II Diabetes, Osteoarthritis, Neuropathy 06/29/2020] [N/A:N/A] Date Acquired: [1:7] [N/A:N/A] Weeks of Treatment: [1:Open] [N/A:N/A] Wound Status: [1:1.1x1.6x0.2] [N/A:N/A] Measurements L x W x D (cm) [1:1.382] [N/A:N/A] A (cm) : rea [1:0.276] [N/A:N/A] Volume (cm) : [1:-1846.50%] [N/A:N/A] % Reduction in A rea: [1:-1214.30%] [N/A:N/A] %  Reduction in Volume: [1:6] Starting Position 1 (o'clock): [1:12] Ending Position 1 (o'clock): [1:0.4] Maximum Distance 1 (cm): [1:Yes] [N/A:N/A] Undermining: [1:Grade 2] [N/A:N/A] Classification: [1:Medium] [N/A:N/A] Exudate A mount: [1:Serosanguineous] [N/A:N/A] Exudate Type: [1:red, brown] [N/A:N/A] Exudate Color: [1:Flat and Intact] [N/A:N/A] Wound Margin: [1:Large (67-100%)] [N/A:N/A] Granulation A mount: [1:Pink] [N/A:N/A] Granulation Quality: [1:Small (1-33%)] [N/A:N/A] Necrotic A mount: [1:Fat Layer (Subcutaneous Tissue): Yes N/A] Exposed Structures: [1:Fascia: No Tendon: No Muscle: No Joint: No Bone: No None] [N/A:N/A] Epithelialization: [1:Debridement - Selective/Open Wound N/A] Debridement: Pre-procedure Verification/Time Out 10:04 [N/A:N/A] Taken: [1:Callus] [N/A:N/A] Tissue Debrided: [1:Skin/Epidermis] [N/A:N/A] Level: [1:1.76] [N/A:N/A] Debridement A (sq cm): [1:rea Curette] [N/A:N/A] Instrument: [1:Minimum] [N/A:N/A] Bleeding: [1:Pressure] [N/A:N/A] Hemostasis A chieved: [1:0] [N/A:N/A] Procedural Pain: [1:0] [N/A:N/A] Post Procedural Pain: [1:Procedure was tolerated well]  [N/A:N/A] Debridement Treatment Response: [1:1.1x1.6x0.2] [N/A:N/A] Post Debridement Measurements L x W x D (cm) [1:0.276] [N/A:N/A] Post Debridement Volume: (cm) [1:Debridement] [N/A:N/A] Treatment Notes Wound #1 (Metatarsal head fifth) Wound Laterality: Plantar, Left Cleanser Soap and Water Discharge Instruction: May shower and wash wound with dial antibacterial soap and water prior to dressing change. Peri-Wound Care Topical Primary Dressing KerraCel Ag Gelling Fiber Dressing, 2x2 in (silver alginate) Discharge Instruction: Apply silver alginate to wound bed as instructed Secondary Dressing Woven Gauze Sponges 2x2 in Discharge Instruction: Apply over primary dressing as directed. Zetuvit Plus Silicone Border Dressing 4x4 (in/in) Discharge Instruction: Apply silicone border over primary dressing as directed. Secured With Compression Wrap Compression Stockings Environmental education officer) Signed: 11/18/2020 10:34:45 AM By: Kalman Shan DO Signed: 11/18/2020 5:27:53 PM By: Levan Hurst RN, BSN Entered By: Kalman Shan on 11/18/2020 10:27:55 -------------------------------------------------------------------------------- Multi-Disciplinary Care Plan Details Patient Name: Date of Service: [name] [name] 11/18/2020 9:30 A M Medical Record Number: [id number] Patient Account Number: [id number] Date of Birth/Sex: Treating RN: 1946-03-20 (74 y.o. Janyth Contes Primary Care Emmett Bracknell: Consuello Masse Other Clinician: Referring Addaleigh Nicholls: Treating Jack Bolio/Extender: Quentin Cornwall in Treatment: 7 Active Inactive Nutrition Nursing Diagnoses: Impaired glucose control: actual or potential Goals: Patient/caregiver verbalizes understanding of need to maintain therapeutic glucose control per primary care physician Date Initiated: 09/27/2020 Target Resolution Date: 11/29/2020 Goal Status: Active Interventions: Assess HgA1c results as ordered upon  admission and as needed Assess patient nutrition upon admission and as needed per policy Provide education on elevated blood sugars and impact on wound healing Provide education on nutrition Notes: 10/25/20: Glucose control ongoing. Electronic Signature(s) Signed: 11/18/2020 5:27:53 PM By: Levan Hurst RN, BSN Entered By: Levan Hurst on 11/18/2020 10:03:01 -------------------------------------------------------------------------------- Pain Assessment Details Patient Name: Date of Service: [name] [name] 11/18/2020 9:30 A M Medical Record Number: [id number] Patient Account Number: [id number] Date of Birth/Sex: Treating RN: Oct 13, 1945 (75 y.o. Janyth Contes Primary Care Jaylenn Baiza: Consuello Masse Other Clinician: Referring Jerald Hennington: Treating Karime Scheuermann/Extender: Quentin Cornwall in Treatment: 7 Active Problems Location of Pain Severity and Description of Pain Patient Has Paino No Site Locations Pain Management and Medication Current Pain Management: Electronic Signature(s) Signed: 11/18/2020 3:41:13 PM By: Sandre Kitty Signed: 11/18/2020 5:27:53 PM By: Levan Hurst RN, BSN Entered By: Sandre Kitty on 11/18/2020 09:44:00 -------------------------------------------------------------------------------- Patient/Caregiver Education Details Patient Name: Date of Service: [name] [name] 5/23/2022andnbsp9:30 [place] Record Number: [id number] Patient Account Number: [id number] Date of Birth/Gender: Treating RN: 09/14/1945 (75 y.o. Janyth Contes Primary Care Physician: Consuello Masse Other Clinician: Referring Physician: Treating Physician/Extender: Quentin Cornwall in Treatment: 7 Education Assessment Education Provided To: Patient Education Topics Provided Wound/Skin Impairment: Methods: Explain/Verbal Responses:  State content correctly Electronic Signature(s) Signed: 11/18/2020 5:27:53 PM By: Levan Hurst RN,  BSN Entered By: Levan Hurst on 11/18/2020 10:03:29 -------------------------------------------------------------------------------- Wound Assessment Details Patient Name: Date of Service: [name] [name] 11/18/2020 9:30 A M Medical Record Number: [id number] Patient Account Number: [id number] Date of Birth/Sex: Treating RN: 1945/10/01 (75 y.o. Janyth Contes Primary Care Shannen Vernon: Consuello Masse Other Clinician: Referring Micheala Morissette: Treating Caylen Kuwahara/Extender: Quentin Cornwall in Treatment: 7 Wound Status Wound Number: 1 Primary Diabetic Wound/Ulcer of the Lower Extremity Etiology: Wound Location: Left, Plantar Metatarsal head fifth Wound Open Wounding Event: Gradually Appeared Status: Date Acquired: 06/29/2020 Comorbid Sleep Apnea, Arrhythmia, Congestive Heart Failure, Coronary Weeks Of Treatment: 7 History: Artery Disease, Type II Diabetes, Osteoarthritis, Neuropathy Clustered Wound: No Photos Wound Measurements Length: (cm) 1.1 Width: (cm) 1.6 Depth: (cm) 0.2 Area: (cm) 1.382 Volume: (cm) 0.276 % Reduction in Area: -1846.5% % Reduction in Volume: -1214.3% Epithelialization: None Tunneling: No Undermining: Yes Starting Position (o'clock): 6 Ending Position (o'clock): 12 Maximum Distance: (cm) 0.4 Wound Description Classification: Grade 2 Wound Margin: Flat and Intact Exudate Amount: Medium Exudate Type: Serosanguineous Exudate Color: red, brown Foul Odor After Cleansing: No Slough/Fibrino Yes Wound Bed Granulation Amount: Large (67-100%) Exposed Structure Granulation Quality: Pink Fascia Exposed: No Necrotic Amount: Small (1-33%) Fat Layer (Subcutaneous Tissue) Exposed: Yes Necrotic Quality: Adherent Slough Tendon Exposed: No Muscle Exposed: No Joint Exposed: No Bone Exposed: No Treatment Notes Wound #1 (Metatarsal head fifth) Wound Laterality: Plantar, Left Cleanser Soap and Water Discharge Instruction: May shower and wash  wound with dial antibacterial soap and water prior to dressing change. Peri-Wound Care Topical Primary Dressing KerraCel Ag Gelling Fiber Dressing, 2x2 in (silver alginate) Discharge Instruction: Apply silver alginate to wound bed as instructed Secondary Dressing Woven Gauze Sponges 2x2 in Discharge Instruction: Apply over primary dressing as directed. Zetuvit Plus Silicone Border Dressing 4x4 (in/in) Discharge Instruction: Apply silicone border over primary dressing as directed. Secured With Compression Wrap Compression Stockings Environmental education officer) Signed: 11/18/2020 5:06:59 PM By: Sandre Kitty Signed: 11/18/2020 5:27:53 PM By: Levan Hurst RN, BSN Entered By: Sandre Kitty on 11/18/2020 16:32:20 -------------------------------------------------------------------------------- Vitals Details Patient Name: Date of Service: [name] [name] 11/18/2020 9:30 A M Medical Record Number: [id number] Patient Account Number: [id number] Date of Birth/Sex: Treating RN: [birth year] (74 y.o. Janyth Contes Primary Care Trevor Wilkie: Consuello Masse Other Clinician: Referring Kayleen Alig: Treating Theon Sobotka/Extender: Quentin Cornwall in Treatment: 7 Vital Signs Time Taken: 09:43 Temperature (F): 98.4 Height (in): 74 Pulse (bpm): 60 Weight (lbs): 238 Respiratory Rate (breaths/min): 17 Body Mass Index (BMI): 30.6 Blood Pressure (mmHg): 125/68 Capillary Blood Glucose (mg/dl): 135 Reference Range: 80 - 120 mg / dl Electronic Signature(s) Signed: 11/18/2020 3:41:13 PM By: Sandre Kitty Entered By: Sandre Kitty on 11/18/2020 09:43:52

## 2020-11-18 NOTE — Progress Notes (Signed)
[name] [name], [name] [name] ([id number]) Visit Report for 11/18/2020 Chief Complaint Document Details Patient Name: Date of Service: [name] [name], [name] [name] 11/18/2020 9:30 A M Medical Record Number: [id number] Patient Account Number: [id number] Date of Birth/Sex: Treating RN: 1945-12-10 (75 y.o. [name] [name] Primary Care Provider: Consuello [name] Other Clinician: Referring Provider: Treating Provider/Extender: [name] [name] in Treatment: 7 Information Obtained from: Patient Chief Complaint 09/27/2020; patient is here for review of the wound over the left fifth metatarsal head Electronic Signature(s) Signed: 11/18/2020 10:34:45 AM By: [name] Shan DO Entered By: [name] [name] on 11/18/2020 10:28:05 -------------------------------------------------------------------------------- Debridement Details Patient Name: Date of Service: [name] [name] 11/18/2020 9:30 A M Medical Record Number: [id number] Patient Account Number: [id number] Date of Birth/Sex: Treating RN: [birth year] (75 y.o. [name] [name] Primary Care Provider: Consuello [name] Other Clinician: Referring Provider: Treating Provider/Extender: [name] [name] in Treatment: 7 Debridement Performed for Assessment: Wound #1 Left,Plantar Metatarsal head fifth Performed By: Physician [name] Shan, DO Debridement Type: Debridement Severity of Tissue Pre Debridement: Fat layer exposed Level of Consciousness (Pre-procedure): Awake and Alert Pre-procedure Verification/Time Out Yes - 10:04 Taken: Start Time: 10:04 T Area Debrided (L x W): otal 1.1 (cm) x 1.6 (cm) = 1.76 (cm) Tissue and other material debrided: Non-Viable, Callus, Skin: Epidermis Level: Skin/Epidermis Debridement Description: Selective/Open Wound Instrument: Curette Bleeding: Minimum Hemostasis Achieved: Pressure End Time: 10:05 Procedural Pain: 0 Post Procedural Pain: 0 Response to Treatment: Procedure was tolerated  well Level of Consciousness (Post- Awake and Alert procedure): Post Debridement Measurements of Total Wound Length: (cm) 1.1 Width: (cm) 1.6 Depth: (cm) 0.2 Volume: (cm) 0.276 Character of Wound/Ulcer Post Debridement: Improved Severity of Tissue Post Debridement: Fat layer exposed Post Procedure Diagnosis Same as Pre-procedure Electronic Signature(s) Signed: 11/18/2020 10:34:45 AM By: [name] Shan DO Signed: 11/18/2020 5:27:53 PM By: [name] Hurst RN, BSN Entered By: [name] [name] on 11/18/2020 10:04:47 -------------------------------------------------------------------------------- HPI Details Patient Name: Date of Service: [name] [name] 11/18/2020 9:30 A M Medical Record Number: [id number] Patient Account Number: [id number] Date of Birth/Sex: Treating RN: [birth year] (75 y.o. [name] [name] Primary Care Provider: Consuello [name] Other Clinician: Referring Provider: Treating Provider/Extender: [name] [name] in Treatment: 7 History of Present Illness HPI Description: ADMISSION 09/27/2020 This is a 75 year old man who lives in [place]. He apparently has had callus over the plantar fifth metatarsal head in the past for which she is followed by podiatry in [place]. They shaved down the callus and this was done in January. He states that he went to [place] in January and became aware of when he was there of an open wound in this area. He followed up with podiatry and has been soaking this twice a day with Epson salts and using Silvadene. Apparently one of the podiatrist told him he may need surgery because of bone protrusion. Past medical history includes an ischemic cardiomyopathy, type 2 diabetes with peripheral neuropathy, BPH, PVD, orthostatic hypotension, atrial fibrillation, hyperlipidemia and hypertension Arterial studies in 2018 showed a noncompressible ABI on the left. It was again noncompressible today at 1.7 although his pulses are  easily palpable 4/8; patient I admitted to the clinic last week. Wound on the plantar left fifth metatarsal head which has been refractory. He also has a bit of subluxation of the bone in the head. He is a type II diabetic with peripheral neuropathy. We used silver collagen after debridement He arrives back in clinic today. He has erythema spreading into the lateral  part of the fifth metatarsal head. I removed some undermining tissue from around the wound and clearly there is a connection between the wound and the lateral part of the fifth met head. A culture was done. Area of erythema on the lateral part of the foot was marked. His wife says that has been there since Wednesday 4/15; the patient arrived last week with erythema spreading in the lateral part of the fifth metatarsal head. There was a wound connected with the plantar fifth met head original wound. I gave him empiric Augmentin. Surprisingly the culture I did was negative. We have been using silver alginate. The wound has been open since January. He arrives in clinic today with the erythema much better 4/22; patient presents today for 1 week follow-up of his fifth metatarsal head wound. Daughter is present with the patient. She states that the wound has improved significantly since 2 weeks ago. There is no longer redness to the foot and the actual wound bed is smaller. Patient overall feels well and has no complaints today. 4/29; patient presents for 1 week follow-up of his fifth metatarsal head wound. Daughter is present. Patient has been using silver alginate every other day to the wound site. He has tried to relieve pressure with a surgical shoe. He denies signs of infections. 5/6; fifth metatarsal head wound. He has been using silver alginate changed to Santyl last week which he managed to obtain for $21 with a coupon we gave him. He also has an appointment with vein and vascular. I think Dr. Heber [name] referred him. He has noncompressible  ABIs. The patient also had an x-ray and that was negative 5/13; the patient went to see Dr. Carlis [name]. Areas on the left fifth metatarsal head plantar and lateral. Dr. Carlis [name] noted that his ABI in the right was 0.99 but monophasic waveforms with a TBI at 0.32. He wanted to go ahead with an angiogram which is booked for Thursday. I talked to the patient about this and advised him to go forward with this. 5/23; patient presents for 1 week follow-up. He has been using silver alginate to the wound bed. He has no complaints today. Denies signs of infection. He had an arteriogram done on 5/19. Electronic Signature(s) Signed: 11/18/2020 10:34:45 AM By: [name] Shan DO Entered By: [name] [name] on 11/18/2020 10:29:18 -------------------------------------------------------------------------------- Physical Exam Details Patient Name: Date of Service: [name] [name] 11/18/2020 9:30 A M Medical Record Number: [id number] Patient Account Number: [id number] Date of Birth/Sex: Treating RN: August 22, 1945 (75 y.o. [name] [name] Primary Care Provider: Consuello [name] Other Clinician: Referring Provider: Treating Provider/Extender: [name] [name] in Treatment: 7 Constitutional respirations regular, non-labored and within target range for patient.Marland Kitchen Psychiatric pleasant and cooperative. Notes Left foot: Fifth metatarsal plantar head with open wound. There is granulation tissue throughout with some sloughing and circumferential callus. Post debridement most sloughing was removed. No signs of infection. Electronic Signature(s) Signed: 11/18/2020 10:34:45 AM By: [name] Shan DO Entered By: [name] [name] on 11/18/2020 10:30:43 -------------------------------------------------------------------------------- Physician Orders Details Patient Name: Date of Service: [name] [name] 11/18/2020 9:30 A M Medical Record Number: [id number] Patient Account Number: [id number] Date of  Birth/Sex: Treating RN: 09-Nov-1945 (75 y.o. [name] [name] Primary Care Provider: Consuello [name] Other Clinician: Referring Provider: Treating Provider/Extender: [name] [name] in Treatment: 7 Verbal / Phone Orders: No Diagnosis Coding ICD-10 Coding Code Description E11.621 Type 2 diabetes mellitus with foot ulcer L97.528 Non-pressure chronic ulcer of other part of left foot  with other specified severity E11.42 Type 2 diabetes mellitus with diabetic polyneuropathy L03.116 Cellulitis of left lower limb E11.51 Type 2 diabetes mellitus with diabetic peripheral angiopathy without gangrene Follow-up Appointments ppointment in 1 week. - with Dr. Dellia Nims Return A Bathing/ Shower/ Hygiene May shower and wash wound with soap and water. - on days changing dressing. Do not soak foot. Edema Control - Lymphedema / SCD / Other Elevate legs to the level of the heart or above for 30 minutes daily and/or when sitting, a frequency of: - throughout the day Avoid standing for long periods of time. Exercise regularly Off-Loading Open toe surgical shoe to: - With Insert-Reduce pressure and friction to area as much as possible. Additional Orders / Instructions Follow Nutritious Diet Wound Treatment Wound #1 - Metatarsal head fifth Wound Laterality: Plantar, Left Cleanser: Soap and Water 1 x Per Day/15 Days Discharge Instructions: May shower and wash wound with dial antibacterial soap and water prior to dressing change. Prim Dressing: KerraCel Ag Gelling Fiber Dressing, 2x2 in (silver alginate) 1 x Per Day/15 Days ary Discharge Instructions: Apply silver alginate to wound bed as instructed Secondary Dressing: Woven Gauze Sponges 2x2 in (Generic) 1 x Per Day/15 Days Discharge Instructions: Apply over primary dressing as directed. Secondary Dressing: Zetuvit Plus Silicone Border Dressing 4x4 (in/in) (Generic) 1 x Per Day/15 Days Discharge Instructions: Apply silicone border over  primary dressing as directed. Electronic Signature(s) Signed: 11/18/2020 10:34:45 AM By: [name] Shan DO Entered By: [name] [name] on 11/18/2020 10:30:55 -------------------------------------------------------------------------------- Problem List Details Patient Name: Date of Service: [name] [name] 11/18/2020 9:30 A M Medical Record Number: [id number] Patient Account Number: [id number] Date of Birth/Sex: Treating RN: May 22, 1946 (75 y.o. [name] [name] Primary Care Provider: Consuello [name] Other Clinician: Referring Provider: Treating Provider/Extender: [name] [name] in Treatment: 7 Active Problems ICD-10 Encounter Code Description Active Date MDM Diagnosis E11.621 Type 2 diabetes mellitus with foot ulcer 09/27/2020 No Yes L97.528 Non-pressure chronic ulcer of other part of left foot with other specified 09/27/2020 No Yes severity E11.42 Type 2 diabetes mellitus with diabetic polyneuropathy 09/27/2020 No Yes L03.116 Cellulitis of left lower limb 10/04/2020 No Yes E11.51 Type 2 diabetes mellitus with diabetic peripheral angiopathy without gangrene 11/11/2020 No Yes Inactive Problems Resolved Problems Electronic Signature(s) Signed: 11/18/2020 10:34:45 AM By: [name] Shan DO Entered By: [name] [name] on 11/18/2020 10:27:43 -------------------------------------------------------------------------------- Progress Note Details Patient Name: Date of Service: [name] [name] 11/18/2020 9:30 A M Medical Record Number: [id number] Patient Account Number: [id number] Date of Birth/Sex: Treating RN: 09/03/1945 (75 y.o. [name] [name] Primary Care Provider: Consuello [name] Other Clinician: Referring Provider: Treating Provider/Extender: [name] [name] in Treatment: 7 Subjective Chief Complaint Information obtained from Patient 09/27/2020; patient is here for review of the wound over the left fifth metatarsal head History of Present  Illness (HPI) ADMISSION 09/27/2020 This is a 75 year old man who lives in [place]. He apparently has had callus over the plantar fifth metatarsal head in the past for which she is followed by podiatry in [place]. They shaved down the callus and this was done in January. He states that he went to [place] in January and became aware of when he was there of an open wound in this area. He followed up with podiatry and has been soaking this twice a day with Epson salts and using Silvadene. Apparently one of the podiatrist told him he may need surgery because of bone protrusion. Past medical history includes an ischemic cardiomyopathy,  type 2 diabetes with peripheral neuropathy, BPH, PVD, orthostatic hypotension, atrial fibrillation, hyperlipidemia and hypertension Arterial studies in 2018 showed a noncompressible ABI on the left. It was again noncompressible today at 1.7 although his pulses are easily palpable 4/8; patient I admitted to the clinic last week. Wound on the plantar left fifth metatarsal head which has been refractory. He also has a bit of subluxation of the bone in the head. He is a type II diabetic with peripheral neuropathy. We used silver collagen after debridement He arrives back in clinic today. He has erythema spreading into the lateral part of the fifth metatarsal head. I removed some undermining tissue from around the wound and clearly there is a connection between the wound and the lateral part of the fifth met head. A culture was done. Area of erythema on the lateral part of the foot was marked. His wife says that has been there since Wednesday 4/15; the patient arrived last week with erythema spreading in the lateral part of the fifth metatarsal head. There was a wound connected with the plantar fifth met head original wound. I gave him empiric Augmentin. Surprisingly the culture I did was negative. We have been using silver alginate. The wound has been open since January.  He arrives in clinic today with the erythema much better 4/22; patient presents today for 1 week follow-up of his fifth metatarsal head wound. Daughter is present with the patient. She states that the wound has improved significantly since 2 weeks ago. There is no longer redness to the foot and the actual wound bed is smaller. Patient overall feels well and has no complaints today. 4/29; patient presents for 1 week follow-up of his fifth metatarsal head wound. Daughter is present. Patient has been using silver alginate every other day to the wound site. He has tried to relieve pressure with a surgical shoe. He denies signs of infections. 5/6; fifth metatarsal head wound. He has been using silver alginate changed to Santyl last week which he managed to obtain for $21 with a coupon we gave him. He also has an appointment with vein and vascular. I think Dr. Heber [place] referred him. He has noncompressible ABIs. The patient also had an x-ray and that was negative 5/13; the patient went to see Dr. Carlis [name]. Areas on the left fifth metatarsal head plantar and lateral. Dr. Carlis [name] noted that his ABI in the right was 0.99 but monophasic waveforms with a TBI at 0.32. He wanted to go ahead with an angiogram which is booked for Thursday. I talked to the patient about this and advised him to go forward with this. 5/23; patient presents for 1 week follow-up. He has been using silver alginate to the wound bed. He has no complaints today. Denies signs of infection. He had an arteriogram done on 5/19. Patient History Information obtained from Patient. Family History Cancer - Mother, Heart Disease - Father, No family history of Diabetes, Hereditary Spherocytosis, Hypertension, Kidney Disease, Lung Disease, Seizures, Stroke, Thyroid Problems, Tuberculosis. 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 Medical History Eyes Denies  history of Cataracts, Glaucoma, Optic Neuritis Ear/Nose/Mouth/Throat Denies history of Chronic sinus problems/congestion, Middle ear problems Hematologic/Lymphatic Denies history of Anemia, Hemophilia, Human Immunodeficiency Virus, Lymphedema, Sickle Cell Disease Respiratory Patient has history of Sleep Apnea - wears bi pap at night Denies history of Aspiration, Asthma, Chronic Obstructive Pulmonary Disease (COPD), Pneumothorax, Tuberculosis Cardiovascular Patient has history  of Arrhythmia - A FibB, Congestive Heart Failure, Coronary Artery Disease Denies history of Angina, Deep Vein Thrombosis, Hypertension, Hypotension, Myocardial Infarction, Peripheral Arterial Disease, Peripheral Venous Disease, Phlebitis, Vasculitis Gastrointestinal Denies history of Cirrhosis , Colitis, Crohnoos, Hepatitis A, Hepatitis B, Hepatitis C Endocrine Patient has history of Type II Diabetes Denies history of Type I Diabetes Genitourinary Denies history of End Stage Renal Disease Immunological Denies history of Lupus Erythematosus, Raynaudoos, Scleroderma Integumentary (Skin) Denies history of History of Burn Musculoskeletal Patient has history of Osteoarthritis Denies history of Gout, Rheumatoid Arthritis, Osteomyelitis Neurologic Patient has history of Neuropathy Denies history of Dementia, Quadriplegia, Paraplegia, Seizure Disorder Hospitalization/Surgery History - cardiac cath. - cardioversion. - coronary artery bypass graft. - lumbar laminectomy. Medical A Surgical History Notes nd Cardiovascular ischemic cardiomyopathy, hypercholesterolemia, heart murmur, Genitourinary BPH Neurologic wallenburg stroke, spinal stenosis of lumbar region Objective Constitutional respirations regular, non-labored and within target range for patient.. Vitals Time Taken: 9:43 AM, Height: 74 in, Weight: 238 lbs, BMI: 30.6, Temperature: 98.4 F, Pulse: 60 bpm, Respiratory Rate: 17 breaths/min, Blood  Pressure: 125/68 mmHg, Capillary Blood Glucose: 135 mg/dl. Psychiatric pleasant and cooperative. General Notes: Left foot: Fifth metatarsal plantar head with open wound. There is granulation tissue throughout with some sloughing and circumferential callus. Post debridement most sloughing was removed. No signs of infection. Integumentary (Hair, Skin) Wound #1 status is Open. Original cause of wound was Gradually Appeared. The date acquired was: 06/29/2020. The wound has been in treatment 7 weeks. The wound is located on the Left,Plantar Metatarsal head fifth. The wound measures 1.1cm length x 1.6cm width x 0.2cm depth; 1.382cm^2 area and 0.276cm^3 volume. There is Fat Layer (Subcutaneous Tissue) exposed. There is no tunneling noted, however, there is undermining starting at 6:00 and ending at 12:00 with a maximum distance of 0.4cm. There is a medium amount of serosanguineous drainage noted. The wound margin is flat and intact. There is large (67- 100%) pink granulation within the wound bed. There is a small (1-33%) amount of necrotic tissue within the wound bed including Adherent Slough. Assessment Active Problems ICD-10 Type 2 diabetes mellitus with foot ulcer Non-pressure chronic ulcer of other part of left foot with other specified severity Type 2 diabetes mellitus with diabetic polyneuropathy Cellulitis of left lower limb Type 2 diabetes mellitus with diabetic peripheral angiopathy without gangrene This is a patient of Dr. Janalyn Rouse. Patient's wound is overall stable. This was debrided in office today. No signs of infection. We will continue silver alginate. He did have an abdominal aortogram done on 5/19 by Dr. Carlis [name]. He had an unsuccessful antegrade recannulization of the left anterior tibial artery. He thinks that there is inline flow through the peroneal to help heal the wound. He may try a retrograde anterior tibial approach if wound does not progress. He will follow- up In 1 week with  Dr. Dellia Nims to discuss total contact cast. Procedures Wound #1 Pre-procedure diagnosis of Wound #1 is a Diabetic Wound/Ulcer of the Lower Extremity located on the Left,Plantar Metatarsal head fifth .Severity of Tissue Pre Debridement is: Fat layer exposed. There was a Selective/Open Wound Skin/Epidermis Debridement with a total area of 1.76 sq cm performed by [name] Shan, DO. With the following instrument(s): Curette to remove Non-Viable tissue/material. Material removed includes Callus and Skin: Epidermis and. No specimens were taken. A time out was conducted at 10:04, prior to the start of the procedure. A Minimum amount of bleeding was controlled with Pressure. The procedure was tolerated well with a pain level of 0  throughout and a pain level of 0 following the procedure. Post Debridement Measurements: 1.1cm length x 1.6cm width x 0.2cm depth; 0.276cm^3 volume. Character of Wound/Ulcer Post Debridement is improved. Severity of Tissue Post Debridement is: Fat layer exposed. Post procedure Diagnosis Wound #1: Same as Pre-Procedure Plan Follow-up Appointments: Return Appointment in 1 week. - with Dr. Dellia Nims Bathing/ Shower/ Hygiene: May shower and wash wound with soap and water. - on days changing dressing. Do not soak foot. Edema Control - Lymphedema / SCD / Other: Elevate legs to the level of the heart or above for 30 minutes daily and/or when sitting, a frequency of: - throughout the day Avoid standing for long periods of time. Exercise regularly Off-Loading: Open toe surgical shoe to: - With Insert-Reduce pressure and friction to area as much as possible. Additional Orders / Instructions: Follow Nutritious Diet WOUND #1: - Metatarsal head fifth Wound Laterality: Plantar, Left Cleanser: Soap and Water 1 x Per Day/15 Days Discharge Instructions: May shower and wash wound with dial antibacterial soap and water prior to dressing change. Prim Dressing: KerraCel Ag Gelling Fiber  Dressing, 2x2 in (silver alginate) 1 x Per Day/15 Days ary Discharge Instructions: Apply silver alginate to wound bed as instructed Secondary Dressing: Woven Gauze Sponges 2x2 in (Generic) 1 x Per Day/15 Days Discharge Instructions: Apply over primary dressing as directed. Secondary Dressing: Zetuvit Plus Silicone Border Dressing 4x4 (in/in) (Generic) 1 x Per Day/15 Days Discharge Instructions: Apply silicone border over primary dressing as directed. 1. In office sharp debridement 2. Silver alginate 3. Follow-up in 1 week with Dr. Dellia Nims Electronic Signature(s) Signed: 11/18/2020 10:34:45 AM By: [name] Shan DO Entered By: [name] [name] on 11/18/2020 10:34:09 -------------------------------------------------------------------------------- HxROS Details Patient Name: Date of Service: [name] [name] 11/18/2020 9:30 A M Medical Record Number: [id number] Patient Account Number: [id number] Date of Birth/Sex: Treating RN: 1945/12/10 (75 y.o. [name] [name] Primary Care Provider: Consuello [name] Other Clinician: Referring Provider: Treating Provider/Extender: [name] [name] in Treatment: 7 Information Obtained From Patient Eyes Medical History: Negative for: Cataracts; Glaucoma; Optic Neuritis Ear/Nose/Mouth/Throat Medical History: Negative for: Chronic sinus problems/congestion; Middle ear problems Hematologic/Lymphatic Medical History: Negative for: Anemia; Hemophilia; Human Immunodeficiency Virus; Lymphedema; Sickle Cell Disease Respiratory Medical History: Positive for: Sleep Apnea - wears bi pap at night Negative for: Aspiration; Asthma; Chronic Obstructive Pulmonary Disease (COPD); Pneumothorax; Tuberculosis Cardiovascular Medical History: Positive for: Arrhythmia - A FibB; Congestive Heart Failure; Coronary Artery Disease Negative for: Angina; Deep Vein Thrombosis; Hypertension; Hypotension; Myocardial Infarction; Peripheral Arterial Disease;  Peripheral Venous Disease; Phlebitis; Vasculitis Past Medical History Notes: ischemic cardiomyopathy, hypercholesterolemia, heart murmur, Gastrointestinal Medical History: Negative for: Cirrhosis ; Colitis; Crohns; Hepatitis A; Hepatitis B; Hepatitis C Endocrine Medical History: Positive for: Type II Diabetes Negative for: Type I Diabetes Time with diabetes: 25 years Treated with: Oral agents Genitourinary Medical History: Negative for: End Stage Renal Disease Past Medical History Notes: BPH Immunological Medical History: Negative for: Lupus Erythematosus; Raynauds; Scleroderma Integumentary (Skin) Medical History: Negative for: History of Burn Musculoskeletal Medical History: Positive for: Osteoarthritis Negative for: Gout; Rheumatoid Arthritis; Osteomyelitis Neurologic Medical History: Positive for: Neuropathy Negative for: Dementia; Quadriplegia; Paraplegia; Seizure Disorder Past Medical History Notes: wall[place] stroke, spinal stenosis of lumbar region Immunizations Pneumococcal Vaccine: Received Pneumococcal Vaccination: Yes Implantable Devices None Hospitalization / Surgery History Type of Hospitalization/Surgery cardiac cath cardioversion coronary artery bypass graft lumbar laminectomy Family and Social History Cancer: Yes - Mother; Diabetes: No; Heart Disease: Yes - Father; Hereditary Spherocytosis: No; Hypertension: No; Kidney  Disease: No; Lung Disease: No; Seizures: No; Stroke: No; Thyroid Problems: No; Tuberculosis: No; 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; Marital Status - Married; Alcohol Use: Rarely; Drug Use: No History; Caffeine Use: Rarely; Financial Concerns: No; Food, Clothing or Shelter Needs: No; Support System Lacking: No; Transportation Concerns: No Electronic Signature(s) Signed: 11/18/2020 10:34:45 AM By: [name] Shan DO Signed: 11/18/2020 5:27:53 PM By: [name] Hurst RN, BSN Entered By: [name] [name] on 11/18/2020  10:29:30 -------------------------------------------------------------------------------- [place] Details Patient Name: Date of Service: [name] [name] 11/18/2020 Medical Record Number: [id number] Patient Account Number: [id number] Date of Birth/Sex: Treating RN: [birth year] (74 y.o. 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.42 Type 2 diabetes mellitus with diabetic polyneuropathy L03.116 Cellulitis of left lower limb E11.51 Type 2 diabetes mellitus with diabetic peripheral angiopathy without gangrene Facility Procedures CPT4 Code: [id number] Description: [phone] - DEBRIDE WOUND 1ST 20 SQ CM OR < ICD-10 Diagnosis Description L97.528 Non-pressure chronic ulcer of other part of left foot with other specified severi E11.621 Type 2 diabetes mellitus with foot ulcer Modifier: ty Quantity: 1 Physician Procedures : CPT4 Code Description Modifier [id number] - WC PHYS DEBR WO ANESTH 20 SQ CM ICD-10 Diagnosis Description L97.528 Non-pressure chronic ulcer of other part of left foot with other specified severity E11.621 Type 2 diabetes mellitus with foot ulcer Quantity: 1 Electronic Signature(s) Signed: 11/18/2020 10:34:45 AM By: [name] Shan DO Entered By: [name] [name] on 11/18/2020 10:34:23

## 2020-11-19 ENCOUNTER — Encounter (HOSPITAL_COMMUNITY): Payer: Self-pay | Admitting: Vascular Surgery

## 2020-11-20 ENCOUNTER — Telehealth: Payer: Self-pay | Admitting: Cardiology

## 2020-11-20 NOTE — Telephone Encounter (Signed)
Called patient and left message to call [facility] at [phone] because they are the ones who will replace his unit.

## 2020-11-20 NOTE — Telephone Encounter (Signed)
Calling to speak to the nurse about his cpap machine. Please advise

## 2020-11-21 NOTE — Telephone Encounter (Signed)
Returned patients call a ll questions were answered about his replacement cpap.

## 2020-11-26 ENCOUNTER — Telehealth: Payer: Self-pay

## 2020-11-26 ENCOUNTER — Encounter (HOSPITAL_BASED_OUTPATIENT_CLINIC_OR_DEPARTMENT_OTHER): Payer: Medicare Other | Admitting: Internal Medicine

## 2020-11-26 ENCOUNTER — Other Ambulatory Visit: Payer: Self-pay

## 2020-11-26 DIAGNOSIS — I110 Hypertensive heart disease with heart failure: Secondary | ICD-10-CM | POA: Diagnosis not present

## 2020-11-26 DIAGNOSIS — L97528 Non-pressure chronic ulcer of other part of left foot with other specified severity: Secondary | ICD-10-CM | POA: Diagnosis not present

## 2020-11-26 DIAGNOSIS — Z20822 Contact with and (suspected) exposure to covid-19: Secondary | ICD-10-CM | POA: Diagnosis not present

## 2020-11-26 DIAGNOSIS — L03116 Cellulitis of left lower limb: Secondary | ICD-10-CM | POA: Diagnosis not present

## 2020-11-26 DIAGNOSIS — L97522 Non-pressure chronic ulcer of other part of left foot with fat layer exposed: Secondary | ICD-10-CM | POA: Diagnosis not present

## 2020-11-26 DIAGNOSIS — E11621 Type 2 diabetes mellitus with foot ulcer: Secondary | ICD-10-CM | POA: Diagnosis not present

## 2020-11-26 DIAGNOSIS — I509 Heart failure, unspecified: Secondary | ICD-10-CM | POA: Diagnosis not present

## 2020-11-26 NOTE — Progress Notes (Signed)
[name] [name], [name] [name] ([id number]) Visit Report for 11/26/2020 Arrival Information Details Patient Name: Date of Service: [name] [name] 11/26/2020 9:15 A M Medical Record Number: [id number] Patient Account Number: [id number] Date of Birth/Sex: Treating RN: [birth year] (75 y.o. [name] [name] Primary Care Agustus Mane: Consuello Masse Other Clinician: Referring Askia Hazelip: Treating Adalene Gulotta/Extender: Zadie Cleverly in Treatment: 8 Visit Information History Since Last Visit Added or deleted any medications: No Patient Arrived: Ambulatory Any new allergies or adverse reactions: No Arrival Time: 09:17 Had a fall or experienced change in No Accompanied By: family activities of daily living that may affect Transfer Assistance: None risk of falls: Patient Identification Verified: Yes Signs or symptoms of abuse/neglect since last visito No Secondary Verification Process Completed: Yes Hospitalized since last visit: No Patient Requires Transmission-Based Precautions: No Implantable device outside of the clinic excluding No Patient Has Alerts: No cellular tissue based products placed in the center since last visit: Has Dressing in Place as Prescribed: Yes Pain Present Now: No Electronic Signature(s) Signed: 11/26/2020 5:31:54 PM By: Rhae Hammock RN Entered By: Rhae Hammock on 11/26/2020 09:18:08 -------------------------------------------------------------------------------- Encounter Discharge Information Details Patient Name: Date of Service: [name] [name] 11/26/2020 9:15 A M Medical Record Number: [id number] Patient Account Number: [id number] Date of Birth/Sex: Treating RN: Oct 07, 1945 (75 y.o. [name] [name] Primary Care Rhilee Currin: Consuello Masse Other Clinician: Referring Decker Cogdell: Treating Micai Apolinar/Extender: Zadie Cleverly in Treatment: 8 Encounter Discharge Information Items Post Procedure Vitals Discharge Condition:  Stable Temperature (F): 98.3 Ambulatory Status: Ambulatory Pulse (bpm): 61 Discharge Destination: Home Respiratory Rate (breaths/min): 17 Transportation: Private Auto Blood Pressure (mmHg): 100/60 Accompanied By: wife Schedule Follow-up Appointment: Yes Clinical Summary of Care: Provided on 11/26/2020 Form Type Recipient Paper Patient Patient Electronic Signature(s) Signed: 11/26/2020 10:19:33 AM By: Lorrin Jackson Entered By: Lorrin Jackson on 11/26/2020 10:19:33 -------------------------------------------------------------------------------- Lower Extremity Assessment Details Patient Name: Date of Service: [name] [name] 11/26/2020 9:15 A M Medical Record Number: [id number] Patient Account Number: [id number] Date of Birth/Sex: Treating RN: 1946-05-25 (75 y.o. [name] [name] Primary Care Kaniyah Lisby: Consuello Masse Other Clinician: Referring Yonatan Guitron: Treating Yesica Kemler/Extender: Zadie Cleverly in Treatment: 8 Edema Assessment Assessed: [name] [name]: No] [name] [name]: No] Edema: [Left: Ye] [Right: s] Calf Left: Right: Point of Measurement: 40 cm From Medial Instep 36.9 cm Ankle Left: Right: Point of Measurement: 10 cm From Medial Instep 23.5 cm Vascular Assessment Pulses: Dorsalis Pedis Palpable: [Left:Yes] Posterior Tibial Palpable: [Left:Yes] Electronic Signature(s) Signed: 11/26/2020 5:31:54 PM By: Rhae Hammock RN Entered By: Rhae Hammock on 11/26/2020 09:36:20 -------------------------------------------------------------------------------- Multi Wound Chart Details Patient Name: Date of Service: [name] [name] 11/26/2020 9:15 A M Medical Record Number: [id number] Patient Account Number: [id number] Date of Birth/Sex: Treating RN: 09/24/1945 (75 y.o. [name] [name] Primary Care Shadawn Hanaway: Consuello Masse Other Clinician: Referring Emely Fahy: Treating Dellis Voght/Extender: Zadie Cleverly in Treatment: 8 Vital  Signs Height(in): 74 Capillary Blood Glucose(mg/dl): 155 Weight(lbs): 238 Pulse(bpm): 64 Body Mass Index(BMI): 31 Blood Pressure(mmHg): 100/60 Temperature(F): 98.3 Respiratory Rate(breaths/min): 17 Photos: [1:No Photos Left, Plantar Metatarsal head fifth] [2:No Photos Left, Dorsal Foot] [N/A:N/A N/A] Wound Location: [1:Gradually Appeared] [2:Gradually Appeared] [N/A:N/A] Wounding Event: [1:Diabetic Wound/Ulcer of the Lower] [2:Diabetic Wound/Ulcer of the Lower] [N/A:N/A] Primary Etiology: [1:Extremity Sleep Apnea, Arrhythmia, Congestive] [2:Extremity Sleep Apnea, Arrhythmia, Congestive] [N/A:N/A] Comorbid History: [1:Heart Failure, Coronary Artery Disease, Type II Diabetes, Osteoarthritis, Neuropathy 06/29/2020] [2:Heart Failure, Coronary Artery Disease, Type II Diabetes, Osteoarthritis, Neuropathy 10/25/2020] [N/A:N/A] Date Acquired: [1:8] [2:0] [N/A:N/A] Weeks of Treatment: [  1:Open] [2:Open] [N/A:N/A] Wound Status: [1:1.2x1.7x0.3] [2:0.2x0.2x0.1] [N/A:N/A] Measurements L x W x D (cm) [1:1.602] [2:0.031] [N/A:N/A] A (cm) : rea [1:0.481] [2:0.003] [N/A:N/A] Volume (cm) : [1:-2156.30%] [2:N/A] [N/A:N/A] % Reduction in A [1:rea: -2190.50%] [2:N/A] [N/A:N/A] % Reduction in Volume: [1:Grade 2] [2:Grade 2] [N/A:N/A] Classification: [1:Medium] [2:Medium] [N/A:N/A] Exudate A mount: [1:Serosanguineous] [2:Serosanguineous] [N/A:N/A] Exudate Type: [1:red, brown] [2:red, brown] [N/A:N/A] Exudate Color: [1:Flat and Intact] [2:Distinct, outline attached] [N/A:N/A] Wound Margin: [1:Large (67-100%)] [2:Large (67-100%)] [N/A:N/A] Granulation A mount: [1:Pink] [2:Red, Pink] [N/A:N/A] Granulation Quality: [1:Small (1-33%)] [2:None Present (0%)] [N/A:N/A] Necrotic A mount: [1:Fat Layer (Subcutaneous Tissue): Yes Fascia: No] [N/A:N/A] Exposed Structures: [1:Fascia: No Tendon: No Muscle: No Joint: No Bone: No None] [2:Fat Layer (Subcutaneous Tissue): No Tendon: No Muscle: No Joint: No Bone: No Medium  (34-66%)] [N/A:N/A] Epithelialization: [1:Debridement - Excisional] [2:N/A] [N/A:N/A] Debridement: Pre-procedure Verification/Time Out 09:44 [2:N/A] [N/A:N/A] Taken: [1:Lidocaine] [2:N/A] [N/A:N/A] Pain Control: [1:Subcutaneous, Slough] [2:N/A] [N/A:N/A] Tissue Debrided: [1:Skin/Subcutaneous Tissue] [2:N/A] [N/A:N/A] Level: [1:2.04] [2:N/A] [N/A:N/A] Debridement A (sq cm): [1:rea Curette] [2:N/A] [N/A:N/A] Instrument: [1:Minimum] [2:N/A] [N/A:N/A] Bleeding: [1:Pressure] [2:N/A] [N/A:N/A] Hemostasis A chieved: [1:0] [2:N/A] [N/A:N/A] Procedural Pain: [1:0] [2:N/A] [N/A:N/A] Post Procedural Pain: [1:Procedure was tolerated well] [2:N/A] [N/A:N/A] Debridement Treatment Response: [1:1.2x1.7x0.3] [2:N/A] [N/A:N/A] Post Debridement Measurements L x W x D (cm) [1:0.481] [2:N/A] [N/A:N/A] Post Debridement Volume: (cm) [1:Debridement] [2:T Contact Cast otal] [N/A:N/A] Procedures Performed: [1:T Contact Cast otal] Treatment Notes Wound #1 (Metatarsal head fifth) Wound Laterality: Plantar, Left Cleanser Soap and Water Discharge Instruction: May shower and wash wound with dial antibacterial soap and water prior to dressing change. Peri-Wound Care Topical Primary Dressing Promogran Prisma Matrix, 4.34 (sq in) (silver collagen) Discharge Instruction: Moisten collagen with saline or hydrogel Secondary Dressing Woven Gauze Sponges 2x2 in Discharge Instruction: Apply over primary dressing as directed. Zetuvit Plus Silicone Border Dressing 4x4 (in/in) Discharge Instruction: Apply silicone border over primary dressing as directed. Secured With Compression Wrap Compression Stockings Add-Ons Wound #2 (Foot) Wound Laterality: Dorsal, Left Cleanser Soap and Water Discharge Instruction: May shower and wash wound with dial antibacterial soap and water prior to dressing change. Peri-Wound Care Topical Primary Dressing Promogran Prisma Matrix, 4.34 (sq in) (silver collagen) Discharge Instruction:  Moisten collagen with saline or hydrogel Secondary Dressing Woven Gauze Sponges 2x2 in Discharge Instruction: Apply over primary dressing as directed. Zetuvit Plus Silicone Border Dressing 4x4 (in/in) Discharge Instruction: Apply silicone border over primary dressing as directed. Secured With Compression Wrap Compression Stockings Environmental education officer) Signed: 11/26/2020 5:02:17 PM By: Linton Ham MD Signed: 11/26/2020 5:31:54 PM By: Rhae Hammock RN Entered By: Linton Ham on 11/26/2020 10:33:19 -------------------------------------------------------------------------------- Multi-Disciplinary Care Plan Details Patient Name: Date of Service: [name] [name] 11/26/2020 9:15 A M Medical Record Number: [id number] Patient Account Number: [id number] Date of Birth/Sex: Treating RN: 1946-06-23 (74 y.o. [name] [name] Primary Care Bishoy Cupp: Consuello Masse Other Clinician: Referring Zaynab Chipman: Treating Uchenna Rappaport/Extender: Zadie Cleverly in Treatment: 8 Active Inactive Nutrition Nursing Diagnoses: Impaired glucose control: actual or potential Goals: Patient/caregiver verbalizes understanding of need to maintain therapeutic glucose control per primary care physician Date Initiated: 09/27/2020 Target Resolution Date: 11/29/2020 Goal Status: Active Interventions: Assess HgA1c results as ordered upon admission and as needed Assess patient nutrition upon admission and as needed per policy Provide education on elevated blood sugars and impact on wound healing Provide education on nutrition Notes: 10/25/20: Glucose control ongoing. Electronic Signature(s) Signed: 11/26/2020 5:31:54 PM By: Rhae Hammock RN Entered By: Rhae Hammock on 11/26/2020 09:47:36 -------------------------------------------------------------------------------- Pain Assessment Details  Patient Name: Date of Service: [name] [name], [name] [name] 11/26/2020 9:15 A M Medical  Record Number: [id number] Patient Account Number: [id number] Date of Birth/Sex: Treating RN: [birth year] (75 y.o. [name] [name] Primary Care Kabria Hetzer: Consuello Masse Other Clinician: Referring Olivene Cookston: Treating Shanterria Franta/Extender: Zadie Cleverly in Treatment: 8 Active Problems Location of Pain Severity and Description of Pain Patient Has Paino No Site Locations Pain Management and Medication Current Pain Management: Electronic Signature(s) Signed: 11/26/2020 5:31:54 PM By: Rhae Hammock RN Entered By: Rhae Hammock on 11/26/2020 09:18:43 -------------------------------------------------------------------------------- Patient/Caregiver Education Details Patient Name: Date of Service: [name] [name] 5/31/2022andnbsp9:15 [place] Record Number: [id number] Patient Account Number: [id number] Date of Birth/Gender: Treating RN: 1945/10/24 (75 y.o. [name] Primary Care Physician: Consuello Masse Other Clinician: Referring Physician: Treating Physician/Extender: Zadie Cleverly in Treatment: 8 Education Assessment Education Provided To: Patient Education Topics Provided Nutrition: Methods: Explain/Verbal Responses: Reinforcements needed Electronic Signature(s) Signed: 11/26/2020 5:31:54 PM By: Rhae Hammock RN Entered By: Rhae Hammock on 11/26/2020 09:47:51 -------------------------------------------------------------------------------- Wound Assessment Details Patient Name: Date of Service: [name] [name] 11/26/2020 9:15 A M Medical Record Number: [id number] Patient Account Number: [id number] Date of Birth/Sex: Treating RN: [birth year] (75 y.o. [name] [name] Primary Care Lyda Colcord: Consuello Masse Other Clinician: Referring Gadge Hermiz: Treating Estil Vallee/Extender: Zadie Cleverly in Treatment: 8 Wound Status Wound Number: 1 Primary Diabetic Wound/Ulcer of the Lower  Extremity Etiology: Wound Location: Left, Plantar Metatarsal head fifth Wound Open Wounding Event: Gradually Appeared Status: Date Acquired: 06/29/2020 Comorbid Sleep Apnea, Arrhythmia, Congestive Heart Failure, Coronary Weeks Of Treatment: 8 History: Artery Disease, Type II Diabetes, Osteoarthritis, Neuropathy Clustered Wound: No Photos Wound Measurements Length: (cm) 1.2 Width: (cm) 1.7 Depth: (cm) 0.3 Area: (cm) 1.602 Volume: (cm) 0.481 % Reduction in Area: -2156.3% % Reduction in Volume: -2190.5% Epithelialization: None Tunneling: No Undermining: No Wound Description Classification: Grade 2 Wound Margin: Flat and Intact Exudate Amount: Medium Exudate Type: Serosanguineous Exudate Color: red, brown Foul Odor After Cleansing: No Slough/Fibrino Yes Wound Bed Granulation Amount: Large (67-100%) Exposed Structure Granulation Quality: Pink Fascia Exposed: No Necrotic Amount: Small (1-33%) Fat Layer (Subcutaneous Tissue) Exposed: Yes Necrotic Quality: Adherent Slough Tendon Exposed: No Muscle Exposed: No Joint Exposed: No Bone Exposed: No Treatment Notes Wound #1 (Metatarsal head fifth) Wound Laterality: Plantar, Left Cleanser Soap and Water Discharge Instruction: May shower and wash wound with dial antibacterial soap and water prior to dressing change. Peri-Wound Care Topical Primary Dressing Promogran Prisma Matrix, 4.34 (sq in) (silver collagen) Discharge Instruction: Moisten collagen with saline or hydrogel Secondary Dressing Woven Gauze Sponges 2x2 in Discharge Instruction: Apply over primary dressing as directed. Zetuvit Plus Silicone Border Dressing 4x4 (in/in) Discharge Instruction: Apply silicone border over primary dressing as directed. Secured With Compression Wrap Compression Stockings Environmental education officer) Signed: 11/26/2020 5:24:49 PM By: Sandre Kitty Signed: 11/26/2020 5:31:54 PM By: Rhae Hammock RN Entered By: Sandre Kitty on 11/26/2020 13:42:33 -------------------------------------------------------------------------------- Wound Assessment Details Patient Name: Date of Service: [name] [name] 11/26/2020 9:15 A M Medical Record Number: [id number] Patient Account Number: [id number] Date of Birth/Sex: Treating RN: 01-08-1946 (75 y.o. [name] [name] Primary Care Vicki Pasqual: Consuello Masse Other Clinician: Referring Tiaira Arambula: Treating Bhavya Grand/Extender: Zadie Cleverly in Treatment: 8 Wound Status Wound Number: 2 Primary Diabetic Wound/Ulcer of the Lower Extremity Etiology: Wound Location: Left, Dorsal Foot Wound Open Wounding Event: Gradually Appeared Status: Date Acquired: 10/25/2020 Comorbid Sleep Apnea, Arrhythmia, Congestive Heart Failure, Coronary Weeks Of Treatment: 0  History: Artery Disease, Type II Diabetes, Osteoarthritis, Neuropathy Clustered Wound: No Photos Wound Measurements Length: (cm) 0.2 Width: (cm) 0.2 Depth: (cm) 0.1 Area: (cm) 0.031 Volume: (cm) 0.003 % Reduction in Area: 0% % Reduction in Volume: 0% Epithelialization: Medium (34-66%) Tunneling: No Undermining: No Wound Description Classification: Grade 2 Wound Margin: Distinct, outline attached Exudate Amount: Medium Exudate Type: Serosanguineous Exudate Color: red, brown Foul Odor After Cleansing: No Slough/Fibrino No Wound Bed Granulation Amount: Large (67-100%) Exposed Structure Granulation Quality: Red, Pink Fascia Exposed: No Necrotic Amount: None Present (0%) Fat Layer (Subcutaneous Tissue) Exposed: No Tendon Exposed: No Muscle Exposed: No Joint Exposed: No Bone Exposed: No Treatment Notes Wound #2 (Foot) Wound Laterality: Dorsal, Left Cleanser Soap and Water Discharge Instruction: May shower and wash wound with dial antibacterial soap and water prior to dressing change. Peri-Wound Care Topical Primary Dressing Promogran Prisma Matrix, 4.34 (sq in) (silver  collagen) Discharge Instruction: Moisten collagen with saline or hydrogel Secondary Dressing Woven Gauze Sponges 2x2 in Discharge Instruction: Apply over primary dressing as directed. Zetuvit Plus Silicone Border Dressing 4x4 (in/in) Discharge Instruction: Apply silicone border over primary dressing as directed. Secured With Compression Wrap Compression Stockings Environmental education officer) Signed: 11/26/2020 5:24:49 PM By: Sandre Kitty Signed: 11/26/2020 5:31:54 PM By: Rhae Hammock RN Entered By: Sandre Kitty on 11/26/2020 13:42:56 -------------------------------------------------------------------------------- Vitals Details Patient Name: Date of Service: [name] [name] 11/26/2020 9:15 A M Medical Record Number: [id number] Patient Account Number: [id number] Date of Birth/Sex: Treating RN: Oct 05, 1945 (75 y.o. [name] [name] Primary Care Nikki Glanzer: Consuello Masse Other Clinician: Referring Juanito Gonyer: Treating Adit Riddles/Extender: Zadie Cleverly in Treatment: 8 Vital Signs Time Taken: 09:18 Temperature (F): 98.3 Height (in): 74 Pulse (bpm): 61 Weight (lbs): 238 Respiratory Rate (breaths/min): 17 Body Mass Index (BMI): 30.6 Blood Pressure (mmHg): 100/60 Capillary Blood Glucose (mg/dl): 155 Reference Range: 80 - 120 mg / dl Electronic Signature(s) Signed: 11/26/2020 5:31:54 PM By: Rhae Hammock RN Entered By: Rhae Hammock on 11/26/2020 09:18:37

## 2020-11-26 NOTE — Progress Notes (Signed)
CAMBRIDGE, DELEO ([id number]) Visit Report for 11/26/2020 Debridement Details Patient Name: Date of Service: [name] [name], [name] [name] 11/26/2020 9:15 A M Medical Record Number: [id number] Patient Account Number: [id number] Date of Birth/Sex: Treating RN: 1946-05-12 (75 y.o. [name], [name] Primary Care Provider: Consuello Masse Other Clinician: Referring Provider: Treating Provider/Extender: Zadie Cleverly in Treatment: 8 Debridement Performed for Assessment: Wound #1 Left,Plantar Metatarsal head fifth Performed By: Physician Ricard Dillon., MD Debridement Type: Debridement Severity of Tissue Pre Debridement: Fat layer exposed Level of Consciousness (Pre-procedure): Awake and Alert Pre-procedure Verification/Time Out Yes - 09:44 Taken: Start Time: 09:44 Pain Control: Lidocaine T Area Debrided (L x W): otal 1.2 (cm) x 1.7 (cm) = 2.04 (cm) Tissue and other material debrided: Slough, Subcutaneous, Skin: Dermis , Skin: Epidermis, Slough Level: Skin/Subcutaneous Tissue Debridement Description: Excisional Instrument: Curette Bleeding: Minimum Hemostasis Achieved: Pressure End Time: 09:45 Procedural Pain: 0 Post Procedural Pain: 0 Response to Treatment: Procedure was tolerated well Level of Consciousness (Post- Awake and Alert procedure): Post Debridement Measurements of Total Wound Length: (cm) 1.2 Width: (cm) 1.7 Depth: (cm) 0.3 Volume: (cm) 0.481 Character of Wound/Ulcer Post Debridement: Improved Severity of Tissue Post Debridement: Fat layer exposed Post Procedure Diagnosis Same as Pre-procedure Electronic Signature(s) Signed: 11/26/2020 5:02:17 PM By: Linton Ham MD Signed: 11/26/2020 5:31:54 PM By: Rhae Hammock RN Entered By: Linton Ham on 11/26/2020 10:33:30 -------------------------------------------------------------------------------- HPI Details Patient Name: Date of Service: [name] [name] 11/26/2020 9:15 A M Medical Record  Number: [id number] Patient Account Number: [id number] Date of Birth/Sex: Treating RN: [birth year] (75 y.o. [name] Primary Care Provider: Consuello Masse Other Clinician: Referring Provider: Treating Provider/Extender: Zadie Cleverly in Treatment: 8 History of Present Illness HPI Description: ADMISSION 09/27/2020 This is a 75 year old man who lives in [place]. He apparently has had callus over the plantar fifth metatarsal head in the past for which she is followed by podiatry in [place]. They shaved down the callus and this was done in January. He states that he went to [place] in January and became aware of when he was there of an open wound in this area. He followed up with podiatry and has been soaking this twice a day with Epson salts and using Silvadene. Apparently one of the podiatrist told him he may need surgery because of bone protrusion. Past medical history includes an ischemic cardiomyopathy, type 2 diabetes with peripheral neuropathy, BPH, PVD, orthostatic hypotension, atrial fibrillation, hyperlipidemia and hypertension Arterial studies in 2018 showed a noncompressible ABI on the left. It was again noncompressible today at 1.7 although his pulses are easily palpable 4/8; patient I admitted to the clinic last week. Wound on the plantar left fifth metatarsal head which has been refractory. He also has a bit of subluxation of the bone in the head. He is a type II diabetic with peripheral neuropathy. We used silver collagen after debridement He arrives back in clinic today. He has erythema spreading into the lateral part of the fifth metatarsal head. I removed some undermining tissue from around the wound and clearly there is a connection between the wound and the lateral part of the fifth met head. A culture was done. Area of erythema on the lateral part of the foot was marked. His wife says that has been there since Wednesday 4/15; the patient  arrived last week with erythema spreading in the lateral part of the fifth metatarsal head. There was a wound connected with the plantar fifth met head  original wound. I gave him empiric Augmentin. Surprisingly the culture I did was negative. We have been using silver alginate. The wound has been open since January. He arrives in clinic today with the erythema much better 4/22; patient presents today for 1 week follow-up of his fifth metatarsal head wound. Daughter is present with the patient. She states that the wound has improved significantly since 2 weeks ago. There is no longer redness to the foot and the actual wound bed is smaller. Patient overall feels well and has no complaints today. 4/29; patient presents for 1 week follow-up of his fifth metatarsal head wound. Daughter is present. Patient has been using silver alginate every other day to the wound site. He has tried to relieve pressure with a surgical shoe. He denies signs of infections. 5/6; fifth metatarsal head wound. He has been using silver alginate changed to Santyl last week which he managed to obtain for $21 with a coupon we gave him. He also has an appointment with vein and vascular. I think Dr. Heber Noble referred him. He has noncompressible ABIs. The patient also had an x-ray and that was negative 5/13; the patient went to see [name]. Areas on the left fifth metatarsal head plantar and lateral. [name] noted that his ABI in the right was 0.99 but monophasic waveforms with a TBI at 0.32. He wanted to go ahead with an angiogram which is booked for Thursday. I talked to the patient about this and advised him to go forward with this. 5/23; patient presents for 1 week follow-up. He has been using silver alginate to the wound bed. He has no complaints today. Denies signs of infection. He had an arteriogram done on 5/19. 5/31; angiogram showed patent common femoral, SFA, profunda, above-knee popliteal artery. 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 He does have inline flow down the left lower extremity through the peroneal that is widely patent. The peroneal gave off collaterals distally to the anterior tibial that feels retrograde at the ankle. An attempted recanalization of the left anterior tibial artery was made but was not successful. The plan as outlined by [name] is that he will continue getting aggressive wound care and he would consider a retrograde left anterior tibial intervention if there is no improvement. Wound itself does not look as though it is making any improvement. There is still tunneling superiorly precariously close to bone Electronic Signature(s) Signed: 11/26/2020 5:02:17 PM By: Linton Ham MD Entered By: Linton Ham on 11/26/2020 10:36:07 -------------------------------------------------------------------------------- Physical Exam Details Patient Name: Date of Service: [name] [name] 11/26/2020 9:15 A M Medical Record Number: [id number] Patient Account Number: [id number] Date of Birth/Sex: Treating RN: 1946/03/09 (75 y.o. [name] Primary Care Provider: Consuello Masse Other Clinician: Referring Provider: Treating Provider/Extender: Zadie Cleverly in Treatment: 8 Constitutional Sitting or standing Blood Pressure is within target range for patient.. Pulse regular and within target range for patient.Marland Kitchen Respirations regular, non-labored and within target range.. Temperature is normal and within the target range for the patient.Marland Kitchen Appears in no distress. Notes Wound exam; left foot fifth met head laterally. There is tunneling in the anterior part of this wound precariously close to bone this is the most worrisome part. But still a nonviable surface in this area which I removed with a #3 curette. Electronic Signature(s) Signed: 11/26/2020 5:02:17 PM By: Linton Ham MD Entered By: Linton Ham on 11/26/2020  10:37:05 -------------------------------------------------------------------------------- Physician Orders Details Patient Name: Date of Service: SAYVON, ARTERBERRY 11/26/2020 9:15 A M Medical Record Number: [id number] Patient Account Number: [id number] Date of Birth/Sex: Treating RN: [birth year] (75 y.o. [name] Primary Care Provider: Consuello Masse Other Clinician: Referring Provider: Treating Provider/Extender: Zadie Cleverly in Treatment: 8 Verbal / Phone Orders: No Diagnosis Coding Follow-up Appointments ppointment in 1 week. - on Tuesday with Dr. Dellia Nims Return A ppointment in: - on Friday for cast change Return A Bathing/ Shower/ Hygiene May shower and wash wound with soap and water. - on days changing dressing. Do not soak foot. Edema Control - Lymphedema / SCD / Other Elevate legs to the level of the heart or above for 30 minutes daily and/or when sitting, a frequency of: - throughout the day Avoid standing for long periods of time. Exercise regularly Off-Loading Total Contact Cast to Left Lower Extremity Open toe surgical shoe to: - With Insert-Reduce pressure and friction to area as much as possible. Additional Orders / Instructions Follow Nutritious Diet Wound Treatment Wound #1 - Metatarsal head fifth Wound Laterality: Plantar, Left Cleanser: Soap and Water 1 x Per Day/15 Days Discharge Instructions: May shower and wash wound with dial antibacterial soap and water prior to dressing change. Prim Dressing: Promogran Prisma Matrix, 4.34 (sq in) (silver collagen) 1 x Per Day/15 Days ary Discharge Instructions: Moisten collagen with saline or hydrogel Secondary Dressing: Woven Gauze Sponges 2x2 in (Generic) 1 x Per ATF/57 Days Discharge Instructions: Apply over primary dressing as directed. Secondary Dressing: Zetuvit Plus Silicone Border Dressing 4x4 (in/in) (Generic) 1 x Per Day/15  Days Discharge Instructions: Apply silicone border over primary dressing as directed. Wound #2 - Foot Wound Laterality: Dorsal, Left Cleanser: Soap and Water 1 x Per Day/15 Days Discharge Instructions: May shower and wash wound with dial antibacterial soap and water prior to dressing change. Prim Dressing: Promogran Prisma Matrix, 4.34 (sq in) (silver collagen) 1 x Per Day/15 Days ary Discharge Instructions: Moisten collagen with saline or hydrogel Secondary Dressing: Woven Gauze Sponges 2x2 in (Generic) 1 x Per DUK/02 Days Discharge Instructions: Apply over primary dressing as directed. Secondary Dressing: Zetuvit Plus Silicone Border Dressing 4x4 (in/in) (Generic) 1 x Per Day/15 Days Discharge Instructions: Apply silicone border over primary dressing as directed. Electronic Signature(s) Signed: 11/26/2020 5:02:17 PM By: Linton Ham MD Signed: 11/26/2020 5:31:54 PM By: Rhae Hammock RN Entered By: Rhae Hammock on 11/26/2020 09:47:22 -------------------------------------------------------------------------------- Problem List Details Patient Name: Date of Service: [name] [name] 11/26/2020 9:15 A M Medical Record Number: [id number] Patient Account Number: [id number] Date of Birth/Sex: Treating RN: 02/24/1946 (74 y.o. [name], [name] Primary Care Provider: Consuello Masse Other Clinician: Referring Provider: Treating Provider/Extender: Zadie Cleverly in Treatment: 8 Active Problems ICD-10 Encounter Code Description Active Date MDM Diagnosis E11.621 Type 2 diabetes mellitus with foot ulcer 09/27/2020 No Yes E11.51 Type 2 diabetes mellitus with diabetic peripheral angiopathy without gangrene 11/11/2020 No Yes E11.42 Type 2 diabetes mellitus with diabetic polyneuropathy 09/27/2020 No Yes L97.528 Non-pressure chronic ulcer of other part of left foot with other specified 09/27/2020 No Yes severity Inactive Problems ICD-10 Code Description Active Date  Inactive Date L03.116 Cellulitis of left lower limb 10/04/2020 10/04/2020 Resolved Problems Electronic Signature(s) Signed: 11/26/2020 5:02:17 PM By: Linton Ham MD Entered By: Linton Ham on 11/26/2020 10:33:06 -------------------------------------------------------------------------------- Progress Note Details Patient Name: Date of Service: [name] [name] 11/26/2020 9:15 A M Medical Record Number: [id number] Patient Account Number: [id number] Date  of Birth/Sex: Treating RN: 1945/11/21 (75 y.o. [name] Primary Care Provider: Consuello Masse Other Clinician: Referring Provider: Treating Provider/Extender: Zadie Cleverly in Treatment: 8 Subjective History of Present Illness (HPI) ADMISSION 09/27/2020 This is a 75 year old man who lives in [place]. He apparently has had callus over the plantar fifth metatarsal head in the past for which she is followed by podiatry in [place]. They shaved down the callus and this was done in January. He states that he went to [place] in January and became aware of when he was there of an open wound in this area. He followed up with podiatry and has been soaking this twice a day with Epson salts and using Silvadene. Apparently one of the podiatrist told him he may need surgery because of bone protrusion. Past medical history includes an ischemic cardiomyopathy, type 2 diabetes with peripheral neuropathy, BPH, PVD, orthostatic hypotension, atrial fibrillation, hyperlipidemia and hypertension Arterial studies in 2018 showed a noncompressible ABI on the left. It was again noncompressible today at 1.7 although his pulses are easily palpable 4/8; patient I admitted to the clinic last week. Wound on the plantar left fifth metatarsal head which has been refractory. He also has a bit of subluxation of the bone in the head. He is a type II diabetic with peripheral neuropathy. We used silver collagen after debridement He  arrives back in clinic today. He has erythema spreading into the lateral part of the fifth metatarsal head. I removed some undermining tissue from around the wound and clearly there is a connection between the wound and the lateral part of the fifth met head. A culture was done. Area of erythema on the lateral part of the foot was marked. His wife says that has been there since Wednesday 4/15; the patient arrived last week with erythema spreading in the lateral part of the fifth metatarsal head. There was a wound connected with the plantar fifth met head original wound. I gave him empiric Augmentin. Surprisingly the culture I did was negative. We have been using silver alginate. The wound has been open since January. He arrives in clinic today with the erythema much better 4/22; patient presents today for 1 week follow-up of his fifth metatarsal head wound. Daughter is present with the patient. She states that the wound has improved significantly since 2 weeks ago. There is no longer redness to the foot and the actual wound bed is smaller. Patient overall feels well and has no complaints today. 4/29; patient presents for 1 week follow-up of his fifth metatarsal head wound. Daughter is present. Patient has been using silver alginate every other day to the wound site. He has tried to relieve pressure with a surgical shoe. He denies signs of infections. 5/6; fifth metatarsal head wound. He has been using silver alginate changed to Santyl last week which he managed to obtain for $21 with a coupon we gave him. He also has an appointment with vein and vascular. I think Dr. Heber Reyno referred him. He has noncompressible ABIs. The patient also had an x-ray and that was negative 5/13; the patient went to see [name]. Areas on the left fifth metatarsal head plantar and lateral. [name] noted that his ABI in the right was 0.99 but monophasic waveforms with a TBI at 0.32. He wanted to go ahead with an angiogram  which is booked for Thursday. I talked to the patient about this and advised him to go forward with this. 5/23; patient presents  for 1 week follow-up. He has been using silver alginate to the wound bed. He has no complaints today. Denies signs of infection. He had an arteriogram done on 5/19. 5/31; angiogram showed patent common femoral, SFA, profunda, above-knee popliteal artery. 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 He does have inline flow down the left lower extremity through the peroneal that is widely patent. The peroneal gave off collaterals distally to the anterior tibial that feels retrograde at the ankle. An attempted recanalization of the left anterior tibial artery was made but was not successful. The plan as outlined by [name] is that he will continue getting aggressive wound care and he would consider a retrograde left anterior tibial intervention if there is no improvement. Wound itself does not look as though it is making any improvement. There is still tunneling superiorly precariously close to bone Objective Constitutional Sitting or standing Blood Pressure is within target range for patient.. Pulse regular and within target range for patient.Marland Kitchen Respirations regular, non-labored and within target range.. Temperature is normal and within the target range for the patient.Marland Kitchen Appears in no distress. Vitals Time Taken: 9:18 AM, Height: 74 in, Weight: 238 lbs, BMI: 30.6, Temperature: 98.3 F, Pulse: 61 bpm, Respiratory Rate: 17 breaths/min, Blood Pressure: 100/60 mmHg, Capillary Blood Glucose: 155 mg/dl. General Notes: Wound exam; left foot fifth met head laterally. There is tunneling in the anterior part of this wound precariously close to bone this is the most worrisome part. But still a nonviable surface in this area which I removed with a #3 curette. Integumentary (Hair, Skin) Wound #1  status is Open. Original cause of wound was Gradually Appeared. The date acquired was: 06/29/2020. The wound has been in treatment 8 weeks. The wound is located on the Left,Plantar Metatarsal head fifth. The wound measures 1.2cm length x 1.7cm width x 0.3cm depth; 1.602cm^2 area and 0.481cm^3 volume. There is Fat Layer (Subcutaneous Tissue) exposed. There is no tunneling or undermining noted. There is a medium amount of serosanguineous drainage noted. The wound margin is flat and intact. There is large (67-100%) pink granulation within the wound bed. There is a small (1-33%) amount of necrotic tissue within the wound bed including Adherent Slough. Wound #2 status is Open. Original cause of wound was Gradually Appeared. The date acquired was: 10/25/2020. The wound is located on the Left,Dorsal Foot. The wound measures 0.2cm length x 0.2cm width x 0.1cm depth; 0.031cm^2 area and 0.003cm^3 volume. There is no tunneling or undermining noted. There is a medium amount of serosanguineous drainage noted. The wound margin is distinct with the outline attached to the wound base. There is large (67-100%) red, pink granulation within the wound bed. There is no necrotic tissue within the wound bed. Assessment Active Problems ICD-10 Type 2 diabetes mellitus with foot ulcer Type 2 diabetes mellitus with diabetic peripheral angiopathy without gangrene Type 2 diabetes mellitus with diabetic polyneuropathy Non-pressure chronic ulcer of other part of left foot with other specified severity Procedures Wound #1 Pre-procedure diagnosis of Wound #1 is a Diabetic Wound/Ulcer of the Lower Extremity located on the Left,Plantar Metatarsal head fifth .Severity of Tissue Pre Debridement is: Fat layer exposed. There was a Excisional Skin/Subcutaneous Tissue Debridement with a total area of 2.04 sq cm performed by Ricard Dillon., MD. With the following instrument(s): Curette Material removed includes Subcutaneous Tissue,  Slough, Skin: Dermis, and Skin: Epidermis after achieving pain control using Lidocaine. No  specimens were taken. A time out was conducted at 09:44, prior to the start of the procedure. A Minimum amount of bleeding was controlled with Pressure. The procedure was tolerated well with a pain level of 0 throughout and a pain level of 0 following the procedure. Post Debridement Measurements: 1.2cm length x 1.7cm width x 0.3cm depth; 0.481cm^3 volume. Character of Wound/Ulcer Post Debridement is improved. Severity of Tissue Post Debridement is: Fat layer exposed. Post procedure Diagnosis Wound #1: Same as Pre-Procedure Pre-procedure diagnosis of Wound #1 is a Diabetic Wound/Ulcer of the Lower Extremity located on the Left,Plantar Metatarsal head fifth . There was a Total Contact Cast Procedure by Ricard Dillon., MD. Post procedure Diagnosis Wound #1: Same as Pre-Procedure Plan Follow-up Appointments: Return Appointment in 1 week. - on Tuesday with Dr. Dellia Nims Return Appointment in: - on Friday for cast change Bathing/ Shower/ Hygiene: May shower and wash wound with soap and water. - on days changing dressing. Do not soak foot. Edema Control - Lymphedema / SCD / Other: Elevate legs to the level of the heart or above for 30 minutes daily and/or when sitting, a frequency of: - throughout the day Avoid standing for long periods of time. Exercise regularly Off-Loading: T Contact Cast to Left Lower Extremity otal Open toe surgical shoe to: - With Insert-Reduce pressure and friction to area as much as possible. Additional Orders / Instructions: Follow Nutritious Diet WOUND #1: - Metatarsal head fifth Wound Laterality: Plantar, Left Cleanser: Soap and Water 1 x Per Day/15 Days Discharge Instructions: May shower and wash wound with dial antibacterial soap and water prior to dressing change. Prim Dressing: Promogran Prisma Matrix, 4.34 (sq in) (silver collagen) 1 x Per Day/15 Days ary Discharge  Instructions: Moisten collagen with saline or hydrogel Secondary Dressing: Woven Gauze Sponges 2x2 in (Generic) 1 x Per LHT/34 Days Discharge Instructions: Apply over primary dressing as directed. Secondary Dressing: Zetuvit Plus Silicone Border Dressing 4x4 (in/in) (Generic) 1 x Per Day/15 Days Discharge Instructions: Apply silicone border over primary dressing as directed. WOUND #2: - Foot Wound Laterality: Dorsal, Left Cleanser: Soap and Water 1 x Per Day/15 Days Discharge Instructions: May shower and wash wound with dial antibacterial soap and water prior to dressing change. Prim Dressing: Promogran Prisma Matrix, 4.34 (sq in) (silver collagen) 1 x Per Day/15 Days ary Discharge Instructions: Moisten collagen with saline or hydrogel Secondary Dressing: Woven Gauze Sponges 2x2 in (Generic) 1 x Per KAJ/68 Days Discharge Instructions: Apply over primary dressing as directed. Secondary Dressing: Zetuvit Plus Silicone Border Dressing 4x4 (in/in) (Generic) 1 x Per Day/15 Days Discharge Instructions: Apply silicone border over primary dressing as directed. 1. I change the primary dressing to silver collagen under total contact cast 2. I think this justification is that the patient may be a person who walks more laterally on this area. I am hopeful to prevent this and to see whether we can get this to close 3. He does have significant PAD but I do not sense that this should be an issue with a trial of a total contact cast 4. I have asked him to call us if there is any problems otherwise we will see him back in 3 days for the obligatory change Electronic Signature(s) Signed: 11/26/2020 5:02:17 PM By: Linton Ham MD Entered By: Linton Ham on 11/26/2020 10:38:47 -------------------------------------------------------------------------------- Total Contact Cast Details Patient Name: Date of Service: [name] [name] 11/26/2020 9:15 A M Medical Record Number: [id number] Patient Account  Number: [id number] Date of  Birth/Sex: Treating RN: 1945/09/18 (75 y.o. [name], [name] Primary Care Provider: Consuello Masse Other Clinician: Referring Provider: Treating Provider/Extender: Zadie Cleverly in Treatment: 8 T Contact Cast Applied for Wound Assessment: otal Wound #1 Left,Plantar Metatarsal head fifth Performed By: Physician Ricard Dillon., MD Post Procedure Diagnosis Same as Pre-procedure Electronic Signature(s) Signed: 11/26/2020 5:02:17 PM By: Linton Ham MD Entered By: Linton Ham on 11/26/2020 10:33:42 -------------------------------------------------------------------------------- SuperBill Details Patient Name: Date of Service: [name] [name] 11/26/2020 Medical Record Number: [id number] Patient Account Number: [id number] Date of Birth/Sex: Treating RN: Dec 12, 1945 (74 y.o. 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 L03.116 Cellulitis of left lower limb E11.51 Type 2 diabetes mellitus with diabetic peripheral angiopathy without gangrene Facility Procedures CPT4 Code: [id number] Description: 07125 - DEB SUBQ TISSUE 20 SQ CM/< ICD-10 Diagnosis Description L97.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

## 2020-11-26 NOTE — Telephone Encounter (Signed)
Patient came by the office this am to request surgery cancellation for this Thursday- pt will speak with MD next week at [facility] and discuss if he will proceed

## 2020-11-27 ENCOUNTER — Other Ambulatory Visit (HOSPITAL_COMMUNITY): Payer: Medicare Other

## 2020-11-27 ENCOUNTER — Encounter (HOSPITAL_COMMUNITY): Admission: RE | Admit: 2020-11-27 | Payer: Medicare Other | Source: Ambulatory Visit

## 2020-11-28 ENCOUNTER — Encounter (HOSPITAL_COMMUNITY): Admission: RE | Payer: Self-pay | Source: Home / Self Care

## 2020-11-28 ENCOUNTER — Ambulatory Visit (HOSPITAL_COMMUNITY): Admission: RE | Admit: 2020-11-28 | Payer: Medicare Other | Source: Home / Self Care | Admitting: Urology

## 2020-11-28 SURGERY — CYSTOSCOPY WITH INSERTION OF UROLIFT
Anesthesia: General

## 2020-11-29 ENCOUNTER — Encounter (HOSPITAL_BASED_OUTPATIENT_CLINIC_OR_DEPARTMENT_OTHER): Payer: Medicare Other | Attending: Internal Medicine | Admitting: Internal Medicine

## 2020-11-29 ENCOUNTER — Other Ambulatory Visit: Payer: Self-pay

## 2020-11-29 DIAGNOSIS — L97529 Non-pressure chronic ulcer of other part of left foot with unspecified severity: Secondary | ICD-10-CM | POA: Diagnosis not present

## 2020-11-29 DIAGNOSIS — E1142 Type 2 diabetes mellitus with diabetic polyneuropathy: Secondary | ICD-10-CM | POA: Diagnosis not present

## 2020-11-29 DIAGNOSIS — E1151 Type 2 diabetes mellitus with diabetic peripheral angiopathy without gangrene: Secondary | ICD-10-CM | POA: Diagnosis not present

## 2020-11-29 DIAGNOSIS — L97522 Non-pressure chronic ulcer of other part of left foot with fat layer exposed: Secondary | ICD-10-CM | POA: Diagnosis not present

## 2020-11-29 DIAGNOSIS — E11621 Type 2 diabetes mellitus with foot ulcer: Secondary | ICD-10-CM | POA: Insufficient documentation

## 2020-11-30 NOTE — Progress Notes (Signed)
[name] [name], [name] [name] ([id number]) Visit Report for 11/29/2020 Debridement Details Patient Name: Date of Service: [name] [name], [name] [name] 11/29/2020 9:45 A M Medical Record Number: [id number] Patient Account Number: [id number] Date of Birth/Sex: Treating RN: April 04, 1946 (75 y.o. [name] [name] Primary Care Provider: Consuello [name] Other Clinician: Referring Provider: Treating Provider/Extender: [name] [name] in Treatment: 9 Debridement Performed for Assessment: Wound #1 Left,Plantar Metatarsal head fifth Performed By: Physician [name] [name] Debridement Type: Debridement Severity of Tissue Pre Debridement: Necrosis of muscle Level of Consciousness (Pre-procedure): Awake and Alert Pre-procedure Verification/Time Out Yes - 10:46 Taken: Start Time: 10:47 T Area Debrided (L x W): otal 1.2 (cm) x 1.7 (cm) = 2.04 (cm) Tissue and other material debrided: Non-Viable, Subcutaneous Level: Skin/Subcutaneous Tissue Debridement Description: Excisional Instrument: Curette Bleeding: Minimum Hemostasis Achieved: Pressure End Time: 10:52 Response to Treatment: Procedure was tolerated well Level of Consciousness (Post- Awake and Alert procedure): Post Debridement Measurements of Total Wound Length: (cm) 1.2 Width: (cm) 1.7 Depth: (cm) 0.4 Volume: (cm) 0.641 Character of Wound/Ulcer Post Debridement: Stable Severity of Tissue Post Debridement: Fat layer exposed Post Procedure Diagnosis Same as Pre-procedure Electronic Signature(s) Signed: 11/29/2020 6:03:14 PM By: [name] [name] Signed: 11/30/2020 7:13:41 AM By: [name] Ham [name] Entered By: [name] [name] on 11/29/2020 11:01:47 -------------------------------------------------------------------------------- HPI Details Patient Name: Date of Service: [name] [name] 11/29/2020 9:45 A M Medical Record Number: [id number] Patient Account Number: [id number] Date of Birth/Sex: Treating RN: 1946/06/15 (75 y.o. [name] [name] Primary Care Provider: Consuello [name] Other Clinician: Referring Provider: Treating Provider/Extender: [name] [name] in Treatment: 9 History of Present Illness HPI Description: ADMISSION 09/27/2020 This is a 75 year old man who lives in [place]. He apparently has had callus over the plantar fifth metatarsal head in the past for which she is followed by podiatry in [place]. They shaved down the callus and this was done in January. He states that he went to [place] in January and became aware of when he was there of an open wound in this area. He followed up with podiatry and has been soaking this twice a day with Epson salts and using Silvadene. Apparently one of the podiatrist told him he may need surgery because of bone protrusion. Past medical history includes an ischemic cardiomyopathy, type 2 diabetes with peripheral neuropathy, BPH, PVD, orthostatic hypotension, atrial fibrillation, hyperlipidemia and hypertension Arterial studies in 2018 showed a noncompressible ABI on the left. It was again noncompressible today at 1.7 although his pulses are easily palpable 4/8; patient I admitted to the clinic last week. Wound on the plantar left fifth metatarsal head which has been refractory. He also has a bit of subluxation of the bone in the head. He is a type II diabetic with peripheral neuropathy. We used silver collagen after debridement He arrives back in clinic today. He has erythema spreading into the lateral part of the fifth metatarsal head. I removed some undermining tissue from around the wound and clearly there is a connection between the wound and the lateral part of the fifth met head. A culture was done. Area of erythema on the lateral part of the foot was marked. His wife says that has been there since Wednesday 4/15; the patient arrived last week with erythema spreading in the lateral part of the fifth metatarsal head. There was a wound connected with  the plantar fifth met head original wound. I gave him empiric Augmentin. Surprisingly the culture I did was negative. We have been  using silver alginate. The wound has been open since January. He arrives in clinic today with the erythema much better 4/22; patient presents today for 1 week follow-up of his fifth metatarsal head wound. Daughter is present with the patient. She states that the wound has improved significantly since 2 weeks ago. There is no longer redness to the foot and the actual wound bed is smaller. Patient overall feels well and has no complaints today. 4/29; patient presents for 1 week follow-up of his fifth metatarsal head wound. Daughter is present. Patient has been using silver alginate every other day to the wound site. He has tried to relieve pressure with a surgical shoe. He denies signs of infections. 5/6; fifth metatarsal head wound. He has been using silver alginate changed to Santyl last week which he managed to obtain for $21 with a coupon we gave him. He also has an appointment with vein and vascular. I think Dr. Heber Lake referred him. He has noncompressible ABIs. The patient also had an x-ray and that was negative 5/13; the patient went to see [name]. Areas on the left fifth metatarsal head plantar and lateral. [name] noted that his ABI in the right was 0.99 but monophasic waveforms with a TBI at 0.32. He wanted to go ahead with an angiogram which is booked for Thursday. I talked to the patient about this and advised him to go forward with this. 5/23; patient presents for 1 week follow-up. He has been using silver alginate to the wound bed. He has no complaints today. Denies signs of infection. He had an arteriogram done on 5/19. 5/31; angiogram showed patent common femoral, SFA, profunda, above-knee popliteal artery. 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 He does have inline  flow down the left lower extremity through the peroneal that is widely patent. The peroneal gave off collaterals distally to the anterior tibial that feels retrograde at the ankle. An attempted recanalization of the left anterior tibial artery was made but was not successful. The plan as outlined by [name] is that he will continue getting aggressive wound care and he would consider a retrograde left anterior tibial intervention if there is no improvement. Wound itself does not look as though it is making any improvement. There is still tunneling superiorly precariously close to bone 6/3; back for the obligatory first total contact cast change. The wound clearly has exposed bone. He is a type II diabetic. He has been revascularized. Plain x- ray did not show osteomyelitis he may require an MRI. After his revascularization I elected to put him into a cast to make sure that all the pressure was coming off this area Electronic Signature(s) Signed: 11/30/2020 7:13:41 AM By: [name] Ham [name] Entered By: [name] [name] on 11/29/2020 11:03:45 -------------------------------------------------------------------------------- Physical Exam Details Patient Name: Date of Service: [name] [name] 11/29/2020 9:45 A M Medical Record Number: [id number] Patient Account Number: [id number] Date of Birth/Sex: Treating RN: March 24, 1946 (75 y.o. [name] [name] Primary Care Provider: Consuello [name] Other Clinician: Referring Provider: Treating Provider/Extender: [name] [name] in Treatment: 9 Notes Wound exam; he has debris on the surface of the proximal part of this wound I removed gently with a #3 curette. He has undermining superiorly. No evidence of surrounding infection Electronic Signature(s) Signed: 11/30/2020 7:13:41 AM By: [name] Ham [name] Entered By: [name] [name] on 11/29/2020 11:04:26 -------------------------------------------------------------------------------- Physician  Orders Details Patient Name: Date of Service:  [name] [name], [name] RRY G. 11/29/2020 9:45 A M Medical Record Number: [id number] Patient Account Number: [id number] Date of Birth/Sex: Treating RN: 12/01/1945 (75 y.o. [name] [name] Primary Care Provider: Consuello [name] Other Clinician: Referring Provider: Treating Provider/Extender: [name] [name] in Treatment: 9 Verbal / Phone Orders: No Diagnosis Coding Follow-up Appointments ppointment in 1 week. - on Tuesday with Dr. Dellia Nims Return A Bathing/ Shower/ Hygiene May shower and wash wound with soap and water. - on days changing dressing. Do not soak foot. Edema Control - Lymphedema / SCD / Other Elevate legs to the level of the heart or above for 30 minutes daily and/or when sitting, a frequency of: - throughout the day Avoid standing for long periods of time. Exercise regularly Off-Loading Total Contact Cast to Left Lower Extremity Additional Orders / Instructions Follow Nutritious Diet Wound Treatment Wound #1 - Metatarsal head fifth Wound Laterality: Plantar, Left Cleanser: Soap and Water 1 x Per Day/15 Days Discharge Instructions: May shower and wash wound with dial antibacterial soap and water prior to dressing change. Prim Dressing: Promogran Prisma Matrix, 4.34 (sq in) (silver collagen) 1 x Per Day/15 Days ary Discharge Instructions: Moisten collagen with saline or hydrogel Secondary Dressing: Woven Gauze Sponges 2x2 in (Generic) 1 x Per WUJ/81 Days Discharge Instructions: Apply over primary dressing as directed. Secondary Dressing: Zetuvit Plus Silicone Border Dressing 4x4 (in/in) (Generic) 1 x Per Day/15 Days Discharge Instructions: Apply silicone border over primary dressing as directed. Wound #2 - Foot Wound Laterality: Dorsal, Left Cleanser: Soap and Water 1 x Per Day/15 Days Discharge Instructions: May shower and wash wound with dial antibacterial soap and water prior to dressing change. Prim Dressing:  Promogran Prisma Matrix, 4.34 (sq in) (silver collagen) 1 x Per Day/15 Days ary Discharge Instructions: Moisten collagen with saline or hydrogel Secondary Dressing: Woven Gauze Sponges 2x2 in (Generic) 1 x Per XBJ/47 Days Discharge Instructions: Apply over primary dressing as directed. Secondary Dressing: Zetuvit Plus Silicone Border Dressing 4x4 (in/in) (Generic) 1 x Per Day/15 Days Discharge Instructions: Apply silicone border over primary dressing as directed. Electronic Signature(s) Signed: 11/29/2020 6:03:14 PM By: [name] [name] Signed: 11/30/2020 7:13:41 AM By: [name] Ham [name] Entered By: [name] [name] on 11/29/2020 10:59:04 -------------------------------------------------------------------------------- Problem List Details Patient Name: Date of Service: [name] [name] 11/29/2020 9:45 A M Medical Record Number: [id number] Patient Account Number: [id number] Date of Birth/Sex: Treating RN: 06-06-1946 (74 y.o. [name] [name] Primary Care Provider: Consuello [name] Other Clinician: Referring Provider: Treating Provider/Extender: [name] [name] in Treatment: 9 Active Problems ICD-10 Encounter Code Description Active Date MDM Diagnosis E11.621 Type 2 diabetes mellitus with foot ulcer 09/27/2020 No Yes E11.51 Type 2 diabetes mellitus with diabetic peripheral angiopathy without gangrene 11/11/2020 No Yes E11.42 Type 2 diabetes mellitus with diabetic polyneuropathy 09/27/2020 No Yes L97.528 Non-pressure chronic ulcer of other part of left foot with other specified 09/27/2020 No Yes severity Inactive Problems ICD-10 Code Description Active Date Inactive Date L03.116 Cellulitis of left lower limb 10/04/2020 10/04/2020 Resolved Problems Electronic Signature(s) Signed: 11/30/2020 7:13:41 AM By: [name] Ham [name] Entered By: [name] [name] on 11/29/2020 11:00:59 -------------------------------------------------------------------------------- Progress Note  Details Patient Name: Date of Service: [name] [name] 11/29/2020 9:45 A M Medical Record Number: [id number] Patient Account Number: [id number] Date of Birth/Sex: Treating RN: [birth year] (75 y.o. [name] [name] Primary Care Provider: Consuello [name] Other Clinician: Referring Provider: Treating Provider/Extender: [name] [name] in Treatment: 9 Subjective History of Present Illness (HPI) ADMISSION 09/27/2020 This  is a 75 year old man who lives in [place]. He apparently has had callus over the plantar fifth metatarsal head in the past for which she is [place]iat[place]. They shaved down the callus and this was done in January. He states that he went to [place] in January and became aware of when he was there of an open wound in this area. He followed up with podiatry and has been soaking this twice a day with Epson salts and using Silvadene. Apparently one of the podiatrist told him he may need surgery because of bone protrusion. Past medical history includes an ischemic cardiomyopathy, type 2 diabetes with peripheral neuropathy, BPH, PVD, orthostatic hypotension, atrial fibrillation, hyperlipidemia and hypertension Arterial studies in 2018 showed a noncompressible ABI on the left. It was again noncompressible today at 1.7 although his pulses are easily palpable 4/8; patient I admitted to the clinic last week. Wound on the plantar left fifth metatarsal head which has been refractory. He also has a bit of subluxation of the bone in the head. He is a type II diabetic with peripheral neuropathy. We used silver collagen after debridement He arrives back in clinic today. He has erythema spreading into the lateral part of the fifth metatarsal head. I removed some undermining tissue from around the wound and clearly there is a connection between the wound and the lateral part of the fifth met head. A culture was done. Area of erythema on the lateral part of  the foot was marked. His wife says that has been there since Wednesday 4/15; the patient arrived last week with erythema spreading in the lateral part of the fifth metatarsal head. There was a wound connected with the plantar fifth met head original wound. I gave him empiric Augmentin. Surprisingly the culture I did was negative. We have been using silver alginate. The wound has been open since January. He arrives in clinic today with the erythema much better 4/22; patient presents today for 1 week follow-up of his fifth metatarsal head wound. Daughter is present with the patient. She states that the wound has improved significantly since 2 weeks ago. There is no longer redness to the foot and the actual wound bed is smaller. Patient overall feels well and has no complaints today. 4/29; patient presents for 1 week follow-up of his fifth metatarsal head wound. Daughter is present. Patient has been using silver alginate every other day to the wound site. He has tried to relieve pressure with a surgical shoe. He denies signs of infections. 5/6; fifth metatarsal head wound. He has been using silver alginate changed to Santyl last week which he managed to obtain for $21 with a coupon we gave him. He also has an appointment with vein and vascular. I think Dr. Heber Rudyard referred him. He has noncompressible ABIs. The patient also had an x-ray and that was negative 5/13; the patient went to see [name]. Areas on the left fifth metatarsal head plantar and lateral. [name] noted that his ABI in the right was 0.99 but monophasic waveforms with a TBI at 0.32. He wanted to go ahead with an angiogram which is booked for Thursday. I talked to the patient about this and advised him to go forward with this. 5/23; patient presents for 1 week follow-up. He has been using silver alginate to the wound bed. He has no complaints today. Denies signs of infection. He had an arteriogram done on 5/19. 5/31; angiogram showed  patent common femoral, SFA, profunda,  above-knee popliteal artery. 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 He does have inline flow down the left lower extremity through the peroneal that is widely patent. The peroneal gave off collaterals distally to the anterior tibial that feels retrograde at the ankle. An attempted recanalization of the left anterior tibial artery was made but was not successful. The plan as outlined by [name] is that he will continue getting aggressive wound care and he would consider a retrograde left anterior tibial intervention if there is no improvement. Wound itself does not look as though it is making any improvement. There is still tunneling superiorly precariously close to bone 6/3; back for the obligatory first total contact cast change. The wound clearly has exposed bone. He is a type II diabetic. He has been revascularized. Plain x- ray did not show osteomyelitis he may require an MRI. After his revascularization I elected to put him into a cast to make sure that all the pressure was coming off this area Objective Constitutional Vitals Time Taken: 10:24 AM, Height: 74 in, Weight: 238 lbs, BMI: 30.6, Temperature: 98.8 F, Pulse: 63 bpm, Respiratory Rate: 17 breaths/min, Blood Pressure: 107/63 mmHg, Capillary Blood Glucose: 138 mg/dl. Integumentary (Hair, Skin) Wound #1 status is Open. Original cause of wound was Gradually Appeared. The date acquired was: 06/29/2020. The wound has been in treatment 9 weeks. The wound is located on the Left,Plantar Metatarsal head fifth. The wound measures 1.2cm length x 1.7cm width x 0.4cm depth; 1.602cm^2 area and 0.641cm^3 volume. There is Fat Layer (Subcutaneous Tissue) exposed. There is no tunneling noted, however, there is undermining starting at 12:00 and ending at 12:00 with a maximum distance of 0.9cm. There is a medium amount of  serosanguineous drainage noted. The wound margin is flat and intact. There is large (67- 100%) pink granulation within the wound bed. There is a small (1-33%) amount of necrotic tissue within the wound bed including Adherent Slough. Wound #2 status is Open. Original cause of wound was Gradually Appeared. The date acquired was: 10/25/2020. The wound is located on the Left,Dorsal Foot. The wound measures 0.2cm length x 0.2cm width x 0.1cm depth; 0.031cm^2 area and 0.003cm^3 volume. Assessment Active Problems ICD-10 Type 2 diabetes mellitus with foot ulcer Type 2 diabetes mellitus with diabetic peripheral angiopathy without gangrene Type 2 diabetes mellitus with diabetic polyneuropathy Non-pressure chronic ulcer of other part of left foot with other specified severity Procedures Wound #1 Pre-procedure diagnosis of Wound #1 is a Diabetic Wound/Ulcer of the Lower Extremity located on the Left,Plantar Metatarsal head fifth .Severity of Tissue Pre Debridement is: Necrosis of muscle. There was a Excisional Skin/Subcutaneous Tissue Debridement with a total area of 2.04 sq cm performed by [name] [name]. With the following instrument(s): Curette to remove Non-Viable tissue/material. Material removed includes Subcutaneous Tissue. No specimens were taken. A time out was conducted at 10:46, prior to the start of the procedure. A Minimum amount of bleeding was controlled with Pressure. The procedure was tolerated well. Post Debridement Measurements: 1.2cm length x 1.7cm width x 0.4cm depth; 0.641cm^3 volume. Character of Wound/Ulcer Post Debridement is stable. Severity of Tissue Post Debridement is: Fat layer exposed. Post procedure Diagnosis Wound #1: Same as Pre-Procedure Pre-procedure diagnosis of Wound #1 is a Diabetic Wound/Ulcer of the Lower Extremity located on the Left,Plantar Metatarsal head fifth . There was a Total Contact Cast Procedure by [name] [name]. Post procedure Diagnosis  Wound #1: Same as Pre-Procedure Wound #2 Pre-procedure diagnosis of Wound #2 is a Diabetic Wound/Ulcer of the Lower Extremity located on the Left,Dorsal Foot . There was a T Contact Cast otal Procedure by [name] [name]. Post procedure Diagnosis Wound #2: Same as Pre-Procedure Plan Follow-up Appointments: Return Appointment in 1 week. - on Tuesday with Dr. Dellia Nims Bathing/ Shower/ Hygiene: May shower and wash wound with soap and water. - on days changing dressing. Do not soak foot. Edema Control - Lymphedema / SCD / Other: Elevate legs to the level of the heart or above for 30 minutes daily and/or when sitting, a frequency of: - throughout the day Avoid standing for long periods of time. Exercise regularly Off-Loading: T Contact Cast to Left Lower Extremity otal Additional Orders / Instructions: Follow Nutritious Diet WOUND #1: - Metatarsal head fifth Wound Laterality: Plantar, Left Cleanser: Soap and Water 1 x Per Day/15 Days Discharge Instructions: May shower and wash wound with dial antibacterial soap and water prior to dressing change. Prim Dressing: Promogran Prisma Matrix, 4.34 (sq in) (silver collagen) 1 x Per Day/15 Days ary Discharge Instructions: Moisten collagen with saline or hydrogel Secondary Dressing: Woven Gauze Sponges 2x2 in (Generic) 1 x Per PYK/99 Days Discharge Instructions: Apply over primary dressing as directed. Secondary Dressing: Zetuvit Plus Silicone Border Dressing 4x4 (in/in) (Generic) 1 x Per Day/15 Days Discharge Instructions: Apply silicone border over primary dressing as directed. WOUND #2: - Foot Wound Laterality: Dorsal, Left Cleanser: Soap and Water 1 x Per Day/15 Days Discharge Instructions: May shower and wash wound with dial antibacterial soap and water prior to dressing change. Prim Dressing: Promogran Prisma Matrix, 4.34 (sq in) (silver collagen) 1 x Per Day/15 Days ary Discharge Instructions: Moisten collagen with saline or  hydrogel Secondary Dressing: Woven Gauze Sponges 2x2 in (Generic) 1 x Per IPJ/82 Days Discharge Instructions: Apply over primary dressing as directed. Secondary Dressing: Zetuvit Plus Silicone Border Dressing 4x4 (in/in) (Generic) 1 x Per Day/15 Days Discharge Instructions: Apply silicone border over primary dressing as directed. 1. No real change from earlier this week except the bone is now exposed 2. He has had a plain x-ray but he has not had an MRI or any advanced imaging 3. He has been revascularized 4. Culture I did was originally negative although I did give him antibiotics Electronic Signature(s) Signed: 11/30/2020 7:13:41 AM By: [name] Ham [name] Entered By: [name] [name] on 11/29/2020 11:05:23 -------------------------------------------------------------------------------- Total Contact Cast Details Patient Name: Date of Service: [name] [name] 11/29/2020 9:45 A M Medical Record Number: [id number] Patient Account Number: [id number] Date of Birth/Sex: Treating RN: 1946/05/06 (75 y.o. [name] [name] Primary Care Provider: Other Clinician: Consuello [name] Referring Provider: Treating Provider/Extender: [name] [name] in Treatment: 9 T Contact Cast Applied for Wound Assessment: otal Wound #1 Left,Plantar Metatarsal head fifth Performed By: Physician [name] [name] Post Procedure Diagnosis Same as Pre-procedure Electronic Signature(s) Signed: 11/30/2020 7:13:41 AM By: [name] Ham [name] Entered By: [name] [name] on 11/29/2020 11:01:34 -------------------------------------------------------------------------------- Total Contact Cast Details Patient Name: Date of Service: [name] [name] 11/29/2020 9:45 A M Medical Record Number: [id number] Patient Account Number: [id number] Date of Birth/Sex: Treating RN: [birth year] (74 y.o. [name] [name] Primary Care Provider: Consuello [name] Other Clinician: Referring Provider: Treating  Provider/Extender: [name] [name] in Treatment: 9 T Contact Cast Applied for Wound Assessment: otal Wound #2 Left,Dorsal Foot Performed By: Physician [name] [name] Post Procedure Diagnosis Same as Pre-procedure Electronic  Signature(s) Signed: 11/30/2020 7:13:41 AM By: [name] Ham [name] Entered By: [name] [name] on 11/29/2020 11:01:55 -------------------------------------------------------------------------------- SuperBill Details Patient Name: Date of Service: [name] [name] 11/29/2020 Medical Record Number: [id number] Patient Account Number: [id number] Date of Birth/Sex: Treating RN: 1945-12-04 (74 y.o. 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

## 2020-12-02 ENCOUNTER — Ambulatory Visit: Payer: Medicare Other

## 2020-12-03 ENCOUNTER — Other Ambulatory Visit (HOSPITAL_COMMUNITY)
Admission: RE | Admit: 2020-12-03 | Discharge: 2020-12-03 | Disposition: A | Discharge status: Home / Self Care | Payer: Medicare Other | Source: Other Acute Inpatient Hospital | Attending: Internal Medicine | Admitting: Internal Medicine

## 2020-12-03 ENCOUNTER — Other Ambulatory Visit: Payer: Self-pay

## 2020-12-03 ENCOUNTER — Encounter (HOSPITAL_BASED_OUTPATIENT_CLINIC_OR_DEPARTMENT_OTHER): Payer: Medicare Other | Admitting: Internal Medicine

## 2020-12-03 ENCOUNTER — Ambulatory Visit: Payer: Medicare Other

## 2020-12-03 DIAGNOSIS — L97529 Non-pressure chronic ulcer of other part of left foot with unspecified severity: Secondary | ICD-10-CM | POA: Diagnosis not present

## 2020-12-03 DIAGNOSIS — L97522 Non-pressure chronic ulcer of other part of left foot with fat layer exposed: Secondary | ICD-10-CM | POA: Diagnosis not present

## 2020-12-03 DIAGNOSIS — E11621 Type 2 diabetes mellitus with foot ulcer: Secondary | ICD-10-CM | POA: Insufficient documentation

## 2020-12-03 DIAGNOSIS — E1142 Type 2 diabetes mellitus with diabetic polyneuropathy: Secondary | ICD-10-CM | POA: Diagnosis not present

## 2020-12-03 DIAGNOSIS — E1151 Type 2 diabetes mellitus with diabetic peripheral angiopathy without gangrene: Secondary | ICD-10-CM | POA: Diagnosis not present

## 2020-12-03 NOTE — Progress Notes (Signed)
[name] [name] ([id number]) Visit Report for 12/03/2020 Arrival Information Details Patient Name: Date of Service: [name] [name], [name] [name] 12/03/2020 3:30 PM Medical Record Number: [id number] Patient Account Number: [id number] Date of Birth/Sex: Treating RN: 03-Jul-1945 (75 y.o. [name] [name], [name] [name] Primary Care Osmani Kersten: Consuello Masse Other Clinician: Referring Brody Kump: Treating [name]/Extender: Zadie Cleverly in Treatment: 9 Visit Information History Since Last Visit Added or deleted any medications: No Patient Arrived: [name] [name] Any new allergies or adverse reactions: No Arrival Time: 15:35 Had a fall or experienced change in No Accompanied By: daughter activities of daily living that may affect Transfer Assistance: None risk of falls: Patient Identification Verified: Yes Signs or symptoms of abuse/neglect since last visito No Secondary Verification Process Completed: Yes Hospitalized since last visit: No Patient Requires Transmission-Based Precautions: No Implantable device outside of the clinic excluding No Patient Has Alerts: No cellular tissue based products placed in the center since last visit: Has Dressing in Place as Prescribed: Yes Pain Present Now: Yes Electronic Signature(s) Signed: 12/03/2020 5:03:12 PM By: Sandre Kitty Entered By: Sandre Kitty on 12/03/2020 15:36:06 -------------------------------------------------------------------------------- Clinic Level of Care Assessment Details Patient Name: Date of Service: [name] [name] 12/03/2020 3:30 PM Medical Record Number: [id number] Patient Account Number: [id number] Date of Birth/Sex: Treating RN: 02-19-1946 (75 y.o. [name] [name], [name] [name] Primary Care Marwah Disbro: Consuello Masse Other Clinician: Referring Maximilian Tallo: Treating [name]/Extender: Zadie Cleverly in Treatment: 9 Clinic Level of Care Assessment Items TOOL 4 Quantity Score X- 1 0 Use when only an EandM is performed  on FOLLOW-UP visit ASSESSMENTS - Nursing Assessment / Reassessment X- 1 10 Reassessment of Co-morbidities (includes updates in patient status) X- 1 5 Reassessment of Adherence to Treatment Plan ASSESSMENTS - Wound and Skin A ssessment / Reassessment X - Simple Wound Assessment / Reassessment - one wound 1 5 []  - 0 Complex Wound Assessment / Reassessment - multiple wounds X- 1 10 Dermatologic / Skin Assessment (not related to wound area) ASSESSMENTS - Focused Assessment []  - 0 Circumferential Edema Measurements - multi extremities []  - 0 Nutritional Assessment / Counseling / Intervention []  - 0 Lower Extremity Assessment (monofilament, tuning fork, pulses) []  - 0 Peripheral Arterial Disease Assessment (using hand held doppler) ASSESSMENTS - Ostomy and/or Continence Assessment and Care []  - 0 Incontinence Assessment and Management []  - 0 Ostomy Care Assessment and Management (repouching, etc.) PROCESS - Coordination of Care X - Simple Patient / Family Education for ongoing care 1 15 []  - 0 Complex (extensive) Patient / Family Education for ongoing care X- 1 10 Staff obtains Programmer, systems, Records, T Results / Process Orders est []  - 0 Staff telephones HHA, Nursing Homes / Clarify orders / etc []  - 0 Routine Transfer to another Facility (non-emergent condition) []  - 0 Routine Hospital Admission (non-emergent condition) []  - 0 New Admissions / Biomedical engineer / Ordering NPWT Apligraf, etc. , []  - 0 Emergency Hospital Admission (emergent condition) X- 1 10 Simple Discharge Coordination []  - 0 Complex (extensive) Discharge Coordination PROCESS - Special Needs []  - 0 Pediatric / Minor Patient Management []  - 0 Isolation Patient Management []  - 0 Hearing / Language / Visual special needs []  - 0 Assessment of Community assistance (transportation, D/C planning, etc.) []  - 0 Additional assistance / Altered mentation []  - 0 Support Surface(s) Assessment (bed,  cushion, seat, etc.) INTERVENTIONS - Wound Cleansing / Measurement X - Simple Wound Cleansing - one wound 1 5 []  - 0 Complex Wound Cleansing - multiple wounds X- 1  5 Wound Imaging (photographs - any number of wounds) []  - 0 Wound Tracing (instead of photographs) X- 1 5 Simple Wound Measurement - one wound []  - 0 Complex Wound Measurement - multiple wounds INTERVENTIONS - Wound Dressings X - Small Wound Dressing one or multiple wounds 1 10 []  - 0 Medium Wound Dressing one or multiple wounds []  - 0 Large Wound Dressing one or multiple wounds []  - 0 Application of Medications - topical []  - 0 Application of Medications - injection INTERVENTIONS - Miscellaneous []  - 0 External ear exam []  - 0 Specimen Collection (cultures, biopsies, blood, body fluids, etc.) []  - 0 Specimen(s) / Culture(s) sent or taken to Lab for analysis []  - 0 Patient Transfer (multiple staff / Civil Service fast streamer / Similar devices) []  - 0 Simple Staple / Suture removal (25 or less) []  - 0 Complex Staple / Suture removal (26 or more) []  - 0 Hypo / Hyperglycemic Management (close monitor of Blood Glucose) []  - 0 Ankle / Brachial Index (ABI) - do not check if billed separately X- 1 5 Vital Signs Has the patient been seen at the hospital within the last three years: Yes Total Score: 95 Level Of Care: New/Established - Level 3 Electronic Signature(s) Signed: 12/03/2020 5:27:47 PM By: Rhae Hammock RN Entered By: Rhae Hammock on 12/03/2020 16:11:19 -------------------------------------------------------------------------------- Encounter Discharge Information Details Patient Name: Date of Service: [name] [name] 12/03/2020 3:30 PM Medical Record Number: [id number] Patient Account Number: [id number] Date of Birth/Sex: Treating RN: 10-Jun-1946 (75 y.o. [name] Primary Care Daria Mcmeekin: Consuello Masse Other Clinician: Referring Nyx Keady: Treating [name]/Extender: Zadie Cleverly  in Treatment: 9 Encounter Discharge Information Items Discharge Condition: Stable Ambulatory Status: Cane Discharge Destination: Home Transportation: Private Auto Accompanied By: daughter Schedule Follow-up Appointment: Yes Clinical Summary of Care: Electronic Signature(s) Signed: 12/03/2020 5:39:03 PM By: Deon Pilling Entered By: Deon Pilling on 12/03/2020 17:31:57 -------------------------------------------------------------------------------- Lower Extremity Assessment Details Patient Name: Date of Service: [name] [name], [name] [name] 12/03/2020 3:30 PM Medical Record Number: [id number] Patient Account Number: [id number] Date of Birth/Sex: Treating RN: 1945-09-02 (75 y.o. [name] Primary Care Faigy Stretch: Consuello Masse Other Clinician: Referring Nakeyia Menden: Treating Rodolfo Notaro/Extender: Zadie Cleverly in Treatment: 9 Edema Assessment Assessed: Shirlyn Goltz: Yes] Patrice Paradise: No] Edema: [Left: Ye] [Right: s] Calf Left: Right: Point of Measurement: 40 cm From Medial Instep 36.8 cm Ankle Left: Right: Point of Measurement: 10 cm From Medial Instep 23 cm Vascular Assessment Pulses: Dorsalis Pedis Palpable: [Left:Yes] Electronic Signature(s) Signed: 12/03/2020 5:30:54 PM By: Lorrin Jackson Entered By: Lorrin Jackson on 12/03/2020 15:55:17 -------------------------------------------------------------------------------- Multi Wound Chart Details Patient Name: Date of Service: [name] [name] 12/03/2020 3:30 PM Medical Record Number: [id number] Patient Account Number: [id number] Date of Birth/Sex: Treating RN: [birth year] (74 y.o. [name] [name], [name] [name] Primary Care Artemisa Sladek: Consuello Masse Other Clinician: Referring Omarie Parcell: Treating Nikea Settle/Extender: Zadie Cleverly in Treatment: 9 Vital Signs Height(in): 74 Capillary Blood Glucose(mg/dl): 137 Weight(lbs): 238 Pulse(bpm): 21 Body Mass Index(BMI): 31 Blood Pressure(mmHg): 100/61 Temperature(F):  98.5 Respiratory Rate(breaths/min): 17 Photos: [1:No Photos Left, Plantar Metatarsal head fifth Left, Dorsal Foot] [2:No Photos] [N/A:N/A N/A] Wound Location: [1:Gradually Appeared] [2:Gradually Appeared] [N/A:N/A] Wounding Event: [1:Diabetic Wound/Ulcer of the Lower] [2:Diabetic Wound/Ulcer of the Lower] [N/A:N/A] Primary Etiology: [1:Extremity Sleep Apnea, Arrhythmia, Congestive Sleep Apnea, Arrhythmia, Congestive] [2:Extremity] [N/A:N/A] Comorbid History: [1:Heart Failure, Coronary Artery Disease, Type II Diabetes, Osteoarthritis, Neuropathy 06/29/2020] [2:Heart Failure, Coronary Artery Disease, Type II Diabetes, Osteoarthritis, Neuropathy 10/25/2020] [N/A:N/A] Date Acquired: [1:9] [2:1] [N/A:N/A]  Weeks of Treatment: [1:Open] [2:Healed - Epithelialized] [N/A:N/A] Wound Status: [1:1x1.7x0.3] [2:0x0x0] [N/A:N/A] Measurements L x W x D (cm) [1:1.335] [2:0] [N/A:N/A] A (cm) : rea [1:0.401] [2:0] [N/A:N/A] Volume (cm) : [1:-1780.30%] [2:100.00%] [N/A:N/A] % Reduction in A rea: [1:-1809.50%] [2:100.00%] [N/A:N/A] % Reduction in Volume: [1:8] Starting Position 1 (o'clock): [1:2] Ending Position 1 (o'clock): [1:0.7] Maximum Distance 1 (cm): [1:Yes] [2:N/A] [N/A:N/A] Undermining: [1:Grade 2] [2:Grade 2] [N/A:N/A] Classification: [1:Medium] [2:N/A] [N/A:N/A] Exudate A mount: [1:Serosanguineous] [2:N/A] [N/A:N/A] Exudate Type: [1:red, brown] [2:N/A] [N/A:N/A] Exudate Color: [1:Well defined, not attached] [2:N/A] [N/A:N/A] Wound Margin: [1:Large (67-100%)] [2:N/A] [N/A:N/A] Granulation A mount: [1:Red, Pink] [2:N/A] [N/A:N/A] Granulation Quality: [1:Small (1-33%)] [2:N/A] [N/A:N/A] Necrotic A mount: [1:Fat Layer (Subcutaneous Tissue): Yes N/A] [N/A:N/A] Exposed Structures: [1:Fascia: No Tendon: No Muscle: No Joint: No Bone: No Small (1-33%)] [2:N/A] [N/A:N/A] Treatment Notes Electronic Signature(s) Signed: 12/03/2020 5:27:47 PM By: Rhae Hammock RN Signed: 12/03/2020 5:45:41 PM By: Linton Ham MD Signed: 12/03/2020 5:45:41 PM By: Linton Ham MD Entered By: Linton Ham on 12/03/2020 17:13:02 -------------------------------------------------------------------------------- Multi-Disciplinary Care Plan Details Patient Name: Date of Service: [name] [name] 12/03/2020 3:30 PM Medical Record Number: [id number] Patient Account Number: [id number] Date of Birth/Sex: Treating RN: [birth year] (74 y.o. [name] [name], [name] [name] Primary Care Melvie Paglia: Consuello Masse Other Clinician: Referring Clarabell Matsuoka: Treating Roni Friberg/Extender: Zadie Cleverly in Treatment: 9 Active Inactive Nutrition Nursing Diagnoses: Impaired glucose control: actual or potential Goals: Patient/caregiver verbalizes understanding of need to maintain therapeutic glucose control per primary care physician Date Initiated: 09/27/2020 Target Resolution Date: 12/27/2020 Goal Status: Active Interventions: Assess HgA1c results as ordered upon admission and as needed Assess patient nutrition upon admission and as needed per policy Provide education on elevated blood sugars and impact on wound healing Provide education on nutrition Treatment Activities: Education provided on Nutrition : 12/03/2020 Notes: 10/25/20: Glucose control ongoing. Electronic Signature(s) Signed: 12/03/2020 5:27:47 PM By: Rhae Hammock RN Entered By: Rhae Hammock on 12/03/2020 16:12:26 -------------------------------------------------------------------------------- Pain Assessment Details Patient Name: Date of Service: [name] [name] 12/03/2020 3:30 PM Medical Record Number: [id number] Patient Account Number: [id number] Date of Birth/Sex: Treating RN: 09-Aug-1945 (75 y.o. [name] Primary Care Lexander Tremblay: Consuello Masse Other Clinician: Referring Beverlie Kurihara: Treating Ramses Klecka/Extender: Zadie Cleverly in Treatment: 9 Active Problems Location of Pain Severity and Description of  Pain Patient Has Paino Yes Site Locations Rate the pain. Rate the pain. Current Pain Level: 6 Pain Management and Medication Current Pain Management: Electronic Signature(s) Signed: 12/03/2020 5:03:12 PM By: Sandre Kitty Signed: 12/03/2020 5:27:47 PM By: Rhae Hammock RN Entered By: Sandre Kitty on 12/03/2020 15:36:33 -------------------------------------------------------------------------------- Patient/Caregiver Education Details Patient Name: Date of Service: [name] [name] 6/7/2022andnbsp3:30 PM Medical Record Number: [id number] Patient Account Number: [id number] Date of Birth/Gender: Treating RN: January 26, 1946 (75 y.o. [name] Primary Care Physician: Consuello Masse Other Clinician: Referring Physician: Treating Physician/Extender: Zadie Cleverly in Treatment: 9 Education Assessment Education Provided To: Patient Education Topics Provided Elevated Blood Sugar/ Impact on Healing: Methods: Explain/Verbal Responses: State content correctly Nutrition: Methods: Explain/Verbal Responses: State content correctly Electronic Signature(s) Signed: 12/03/2020 5:27:47 PM By: Rhae Hammock RN Entered By: Rhae Hammock on 12/03/2020 15:37:22 -------------------------------------------------------------------------------- Wound Assessment Details Patient Name: Date of Service: [name] [name] 12/03/2020 3:30 PM Medical Record Number: [id number] Patient Account Number: [id number] Date of Birth/Sex: Treating RN: [birth year] (74 y.o. [name] Primary Care Latrise Bowland: Consuello Masse Other Clinician: Referring Kamera Dubas: Treating Marce Schartz/Extender: Zadie Cleverly in Treatment: 9 Wound Status  Wound Number: 1 Primary Diabetic Wound/Ulcer of the Lower Extremity Etiology: Wound Location: Left, Plantar Metatarsal head fifth Wound Open Wounding Event: Gradually Appeared Status: Date Acquired: 06/29/2020 Comorbid  Sleep Apnea, Arrhythmia, Congestive Heart Failure, Coronary Weeks Of Treatment: 9 History: Artery Disease, Type II Diabetes, Osteoarthritis, Neuropathy Clustered Wound: No Wound Measurements Length: (cm) 1 Width: (cm) 1.7 Depth: (cm) 0.3 Area: (cm) 1.335 Volume: (cm) 0.401 % Reduction in Area: -1780.3% % Reduction in Volume: -1809.5% Epithelialization: Small (1-33%) Tunneling: No Undermining: Yes Starting Position (o'clock): 8 Ending Position (o'clock): 2 Maximum Distance: (cm) 0.7 Wound Description Classification: Grade 2 Wound Margin: Well defined, not attached Exudate Amount: Medium Exudate Type: Serosanguineous Exudate Color: red, brown Foul Odor After Cleansing: No Slough/Fibrino Yes Wound Bed Granulation Amount: Large (67-100%) Exposed Structure Granulation Quality: Red, Pink Fascia Exposed: No Necrotic Amount: Small (1-33%) Fat Layer (Subcutaneous Tissue) Exposed: Yes Necrotic Quality: Adherent Slough Tendon Exposed: No Muscle Exposed: No Joint Exposed: No Bone Exposed: No Treatment Notes Wound #1 (Metatarsal head fifth) Wound Laterality: Plantar, Left Cleanser Wound Cleanser Discharge Instruction: Cleanse the wound with wound cleanser prior to applying a clean dressing using gauze sponges, not tissue or cotton balls. Soap and Water Discharge Instruction: May shower and wash wound with dial antibacterial soap and water prior to dressing change. Peri-Wound Care Topical Primary Dressing KerraCel Ag Gelling Fiber Dressing, 4x5 in (silver alginate) Discharge Instruction: Apply silver alginate to wound bed as instructed Secondary Dressing Woven Gauze Sponges 2x2 in Discharge Instruction: Apply over primary dressing as directed. Zetuvit Plus Silicone Border Dressing 4x4 (in/in) Discharge Instruction: Apply silicone border over primary dressing as directed. Secured With [employer], 4.5x3.1 (in/yd) Discharge Instruction: Secure with Kerlix as  directed. 35M Medipore H Soft Cloth Surgical T 4 x 2 (in/yd) ape Discharge Instruction: Secure dressing with tape as directed. Compression Wrap Compression Stockings Add-Ons Electronic Signature(s) Signed: 12/03/2020 5:30:54 PM By: Lorrin Jackson Entered By: Lorrin Jackson on 12/03/2020 15:54:04 -------------------------------------------------------------------------------- Wound Assessment Details Patient Name: Date of Service: [name] [name] 12/03/2020 3:30 PM Medical Record Number: [id number] Patient Account Number: [id number] Date of Birth/Sex: Treating RN: 07/24/1945 (74 y.o. [name] Primary Care Kyler Lerette: Consuello Masse Other Clinician: Referring Texanna Hilburn: Treating Obe Ahlers/Extender: Zadie Cleverly in Treatment: 9 Wound Status Wound Number: 2 Primary Diabetic Wound/Ulcer of the Lower Extremity Etiology: Wound Location: Left, Dorsal Foot Wound Healed - Epithelialized Wounding Event: Gradually Appeared Status: Date Acquired: 10/25/2020 Comorbid Sleep Apnea, Arrhythmia, Congestive Heart Failure, Coronary Weeks Of Treatment: 1 History: Artery Disease, Type II Diabetes, Osteoarthritis, Neuropathy Clustered Wound: No Wound Measurements Length: (cm) 0 Width: (cm) 0 Depth: (cm) 0 Area: (cm) 0 Volume: (cm) 0 % Reduction in Area: 100% % Reduction in Volume: 100% Wound Description Classification: Grade 2 Electronic Signature(s) Signed: 12/03/2020 5:30:54 PM By: Lorrin Jackson Entered By: Lorrin Jackson on 12/03/2020 15:51:44 -------------------------------------------------------------------------------- Vitals Details Patient Name: Date of Service: [name] [name] 12/03/2020 3:30 PM Medical Record Number: [id number] Patient Account Number: [id number] Date of Birth/Sex: Treating RN: 17-Jul-1945 (75 y.o. [name] [name], [name] [name] Primary Care Vestal Markin: Consuello Masse Other Clinician: Referring Chavie Kolinski: Treating Terria Deschepper/Extender: Zadie Cleverly in Treatment: 9 Vital Signs Time Taken: 15:36 Temperature (F): 98.5 Height (in): 74 Pulse (bpm): 61 Weight (lbs): 238 Respiratory Rate (breaths/min): 17 Body Mass Index (BMI): 30.6 Blood Pressure (mmHg): 100/61 Capillary Blood Glucose (mg/dl): 137 Reference Range: 80 - 120 mg / dl Electronic Signature(s) Signed: 12/03/2020 5:03:12 PM By: Sandre Kitty Entered By: Sandre Kitty  on 12/03/2020 15:36:25

## 2020-12-03 NOTE — Progress Notes (Signed)
ALF, DOYLE ([id number]) Visit Report for 12/03/2020 HPI Details Patient Name: Date of Service: [name] [name], [name] [name] 12/03/2020 3:30 PM Medical Record Number: [id number] Patient Account Number: [id number] Date of Birth/Sex: Treating RN: 1946-02-26 (75 y.o. [name] [name] Primary Care Provider: Consuello Masse Other Clinician: Referring Provider: Treating Provider/Extender: Zadie Cleverly in Treatment: 9 History of Present Illness HPI Description: ADMISSION 09/27/2020 This is a 75 year old man who lives in [place]. He apparently has had callus over the plantar fifth metatarsal head in the past for which she is followed by podiatry in [place]. They shaved down the callus and this was done in January. He states that he went to [place] in January and became aware of when he was there of an open wound in this area. He followed up with podiatry and has been soaking this twice a day with Epson salts and using Silvadene. Apparently one of the podiatrist told him he may need surgery because of bone protrusion. Past medical history includes an ischemic cardiomyopathy, type 2 diabetes with peripheral neuropathy, BPH, PVD, orthostatic hypotension, atrial fibrillation, hyperlipidemia and hypertension Arterial studies in 2018 showed a noncompressible ABI on the left. It was again noncompressible today at 1.7 although his pulses are easily palpable 4/8; patient I admitted to the clinic last week. Wound on the plantar left fifth metatarsal head which has been refractory. He also has a bit of subluxation of the bone in the head. He is a type II diabetic with peripheral neuropathy. We used silver collagen after debridement He arrives back in clinic today. He has erythema spreading into the lateral part of the fifth metatarsal head. I removed some undermining tissue from around the wound and clearly there is a connection between the wound and the lateral part of the fifth met head. A  culture was done. Area of erythema on the lateral part of the foot was marked. His wife says that has been there since Wednesday 4/15; the patient arrived last week with erythema spreading in the lateral part of the fifth metatarsal head. There was a wound connected with the plantar fifth met head original wound. I gave him empiric Augmentin. Surprisingly the culture I did was negative. We have been using silver alginate. The wound has been open since January. He arrives in clinic today with the erythema much better 4/22; patient presents today for 1 week follow-up of his fifth metatarsal head wound. Daughter is present with the patient. She states that the wound has improved significantly since 2 weeks ago. There is no longer redness to the foot and the actual wound bed is smaller. Patient overall feels well and has no complaints today. 4/29; patient presents for 1 week follow-up of his fifth metatarsal head wound. Daughter is present. Patient has been using silver alginate every other day to the wound site. He has tried to relieve pressure with a surgical shoe. He denies signs of infections. 5/6; fifth metatarsal head wound. He has been using silver alginate changed to Santyl last week which he managed to obtain for $21 with a coupon we gave him. He also has an appointment with vein and vascular. I think Dr. Heber [place] referred him. He has noncompressible ABIs. The patient also had an x-ray and that was negative 5/13; the patient went to see [name]. Areas on the left fifth metatarsal head plantar and lateral. [name] noted that his ABI in the right was 0.99 but monophasic waveforms with a TBI at 0.32. He  wanted to go ahead with an angiogram which is booked for Thursday. I talked to the patient about this and advised him to go forward with this. 5/23; patient presents for 1 week follow-up. He has been using silver alginate to the wound bed. He has no complaints today. Denies signs of infection. He  had an arteriogram done on 5/19. 5/31; angiogram showed patent common femoral, SFA, profunda, above-knee popliteal artery. 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 He does have inline flow down the left lower extremity through the peroneal that is widely patent. The peroneal gave off collaterals distally to the anterior tibial that feels retrograde at the ankle. An attempted recanalization of the left anterior tibial artery was made but was not successful. The plan as outlined by [name] is that he will continue getting aggressive wound care and he would consider a retrograde left anterior tibial intervention if there is no improvement. Wound itself does not look as though it is making any improvement. There is still tunneling superiorly precariously close to bone 6/3; back for the obligatory first total contact cast change. The wound clearly has exposed bone. He is a type II diabetic. He has been revascularized. Plain x- ray did not show osteomyelitis he may require an MRI. After his revascularization I elected to put him into a cast to make sure that all the pressure was coming off this area 6/7; patient back for his routine visit. The wound is not doing well. The patient had a lot of discomfort since he was last here 4 days ago. No systemic illness. He will not go back in the total contact cast today. Electronic Signature(s) Signed: 12/03/2020 5:45:41 PM By: Linton Ham MD Entered By: Linton Ham on 12/03/2020 17:14:04 -------------------------------------------------------------------------------- Physical Exam Details Patient Name: Date of Service: [name] [name] 12/03/2020 3:30 PM Medical Record Number: [id number] Patient Account Number: [id number] Date of Birth/Sex: Treating RN: [birth year] (75 y.o. [name] [name] Primary Care Provider: Consuello Masse Other Clinician: Referring  Provider: Treating Provider/Extender: Zadie Cleverly in Treatment: 9 Constitutional Sitting or standing Blood Pressure is within target range for patient.. Pulse regular and within target range for patient.Marland Kitchen Respirations regular, non-labored and within target range.. Temperature is normal and within the target range for the patient.Marland Kitchen Appears in no distress. Cardiovascular Pedal pulses palpable. Notes Wound exam; debris on the surface I did not debride this. Surface does not look as healthy as it did even 4 days ago. Circumferential undermining. I think there is probably exposed bone. Slight erythema around the wound that is tender. Electronic Signature(s) Signed: 12/03/2020 5:45:41 PM By: Linton Ham MD Entered By: Linton Ham on 12/03/2020 17:14:54 -------------------------------------------------------------------------------- Physician Orders Details Patient Name: Date of Service: [name] [name] 12/03/2020 3:30 PM Medical Record Number: [id number] Patient Account Number: [id number] Date of Birth/Sex: Treating RN: Aug 08, 1945 (74 y.o. [name] [name] Primary Care Provider: Consuello Masse Other Clinician: Referring Provider: Treating Provider/Extender: Zadie Cleverly in Treatment: 9 Verbal / Phone Orders: No Diagnosis Coding Follow-up Appointments ppointment in 1 week. - on Tuesday with Dr. Dellia Nims Return A Bathing/ Shower/ Hygiene May shower and wash wound with soap and water. - on days changing dressing. Do not soak foot. Edema Control - Lymphedema / SCD / Other Elevate legs to the level of the heart or above for 30 minutes daily and/or when sitting, a frequency of: - throughout the day  Avoid standing for long periods of time. Exercise regularly Additional Orders / Instructions Follow Nutritious Diet Wound Treatment Wound #1 - Metatarsal head fifth Wound Laterality: Plantar, Left Cleanser: Soap and Water 1 x Per Day/15  Days Discharge Instructions: May shower and wash wound with dial antibacterial soap and water prior to dressing change. Cleanser: Wound Cleanser (DME) (Generic) 1 x Per Day/15 Days Discharge Instructions: Cleanse the wound with wound cleanser prior to applying a clean dressing using gauze sponges, not tissue or cotton balls. Prim Dressing: KerraCel Ag Gelling Fiber Dressing, 4x5 in (silver alginate) (DME) (Generic) 1 x Per Day/15 Days ary Discharge Instructions: Apply silver alginate to wound bed as instructed Secondary Dressing: Woven Gauze Sponges 2x2 in (DME) (Generic) 1 x Per Day/15 Days Discharge Instructions: Apply over primary dressing as directed. Secondary Dressing: Zetuvit Plus Silicone Border Dressing 4x4 (in/in) (DME) (Generic) 1 x Per Day/15 Days Discharge Instructions: Apply silicone border over primary dressing as directed. Secured With: [employer], 4.5x3.1 (in/yd) (DME) (Generic) 1 x Per Day/15 Days Discharge Instructions: Secure with Kerlix as directed. Secured With: 54M Medipore H Soft Cloth Surgical T 4 x 2 (in/yd) (DME) (Generic) 1 x Per Day/15 Days ape Discharge Instructions: Secure dressing with tape as directed. Radiology MRI, lower extremity with and without contrast - STAT ORDER FOR MRI LEFT FOOT D/T NON-HEALING ULCER AND POSSIBLE OSTEOMYELITIS Patient Medications llergies: No Known Allergies A Notifications Medication Indication Start End wound infection 12/03/2020 Augmentin DOSE oral 875 mg-125 mg tablet - 1 tablet oral bid for 7 days wouind infection 12/03/2020 sulfamethoxazole-trimethoprim DOSE oral 800 mg-160 mg tablet - 1 tablet oral bid for 7 days Electronic Signature(s) Signed: 12/03/2020 5:27:47 PM By: Rhae Hammock RN Signed: 12/03/2020 5:45:41 PM By: Linton Ham MD Previous Signature: 12/03/2020 4:19:11 PM Version By: Linton Ham MD Entered By: Rhae Hammock on 12/03/2020 16:59:58 Prescription  12/03/2020 -------------------------------------------------------------------------------- Leta Speller MD Patient Name: Provider: 17-Apr-1946 [id number] Date of Birth: NPI#Jerilynn Mages [id number] Sex: DEA #: [phone] [id number] Phone #: License #: [place] Patient Address: [address] [place], [place] [address] [address], [place] [address] [phone] Allergies No Known Allergies Provider's Orders MRI, lower extremity with and without contrast - STAT ORDER FOR MRI LEFT FOOT D/T NON-HEALING ULCER AND POSSIBLE OSTEOMYELITIS Hand Signature: Date(s): Electronic Signature(s) Signed: 12/03/2020 5:27:47 PM By: Rhae Hammock RN Signed: 12/03/2020 5:45:41 PM By: Linton Ham MD Entered By: Rhae Hammock on 12/03/2020 16:59:58 -------------------------------------------------------------------------------- Problem List Details Patient Name: Date of Service: [name] [name] 12/03/2020 3:30 PM Medical Record Number: [id number] Patient Account Number: [id number] Date of Birth/Sex: Treating RN: Nov 25, 1945 (74 y.o. [name], [name] Primary Care Provider: Consuello Masse Other Clinician: Referring Provider: Treating Provider/Extender: Zadie Cleverly in Treatment: 9 Active Problems ICD-10 Encounter Code Description Active Date MDM Diagnosis E11.621 Type 2 diabetes mellitus with foot ulcer 09/27/2020 No Yes E11.51 Type 2 diabetes mellitus with diabetic peripheral angiopathy without gangrene 11/11/2020 No Yes E11.42 Type 2 diabetes mellitus with diabetic polyneuropathy 09/27/2020 No Yes L97.528 Non-pressure chronic ulcer of other part of left foot with other specified 09/27/2020 No Yes severity L03.116 Cellulitis of left lower limb 10/04/2020 No Yes Inactive Problems Resolved Problems Electronic Signature(s) Signed: 12/03/2020 5:45:41 PM By: Linton Ham MD Entered By: Linton Ham on 12/03/2020 17:12:52 -------------------------------------------------------------------------------- Progress Note Details Patient Name: Date of Service: [name] [name] 12/03/2020 3:30 PM Medical Record Number: [id number] Patient Account  Number: [id number] Date of Birth/Sex: Treating RN: 08-02-1945 (75 y.o. [name] [name] Primary Care Provider: Consuello Masse Other Clinician: Referring Provider: Treating Provider/Extender: Zadie Cleverly in Treatment: 9 Subjective History of Present Illness (HPI) ADMISSION 09/27/2020 This is a 75 year old man who lives in [place]. He apparently has had callus over the plantar fifth metatarsal head in the past for which she is followed by podiatry in [place]. They shaved down the callus and this was done in January. He states that he went to [place] in January and became aware of when he was there of an open wound in this area. He followed up with podiatry and has been soaking this twice a day with Epson salts and using Silvadene. Apparently one of the podiatrist told him he may need surgery because of bone protrusion. Past medical history includes an ischemic cardiomyopathy, type 2 diabetes with peripheral neuropathy, BPH, PVD, orthostatic hypotension, atrial fibrillation, hyperlipidemia and hypertension Arterial studies in 2018 showed a noncompressible ABI on the left. It was again noncompressible today at 1.7 although his pulses are easily palpable 4/8; patient I admitted to the clinic last week. Wound on the plantar left fifth metatarsal head which has been refractory. He also has a bit of subluxation of the bone in the head. He is a type II diabetic with peripheral neuropathy. We used silver collagen after debridement He arrives back in clinic today. He has erythema spreading into the lateral part of the fifth metatarsal head. I removed some undermining tissue from around the wound and clearly there is a  connection between the wound and the lateral part of the fifth met head. A culture was done. Area of erythema on the lateral part of the foot was marked. His wife says that has been there since Wednesday 4/15; the patient arrived last week with erythema spreading in the lateral part of the fifth metatarsal head. There was a wound connected with the plantar fifth met head original wound. I gave him empiric Augmentin. Surprisingly the culture I did was negative. We have been using silver alginate. The wound has been open since January. He arrives in clinic today with the erythema much better 4/22; patient presents today for 1 week follow-up of his fifth metatarsal head wound. Daughter is present with the patient. She states that the wound has improved significantly since 2 weeks ago. There is no longer redness to the foot and the actual wound bed is smaller. Patient overall feels well and has no complaints today. 4/29; patient presents for 1 week follow-up of his fifth metatarsal head wound. Daughter is present. Patient has been using silver alginate every other day to the wound site. He has tried to relieve pressure with a surgical shoe. He denies signs of infections. 5/6; fifth metatarsal head wound. He has been using silver alginate changed to Santyl last week which he managed to obtain for $21 with a coupon we gave him. He also has an appointment with vein and vascular. I think Dr. Heber Tobias referred him. He has noncompressible ABIs. The patient also had an x-ray and that was negative 5/13; the patient went to see [name]. Areas on the left fifth metatarsal head plantar and lateral. [name] noted that his ABI in the right was 0.99 but monophasic waveforms with a TBI at 0.32. He wanted to go ahead with an angiogram which is booked for Thursday. I talked to the patient about this and advised him to go forward with this. 5/23;  patient presents for 1 week follow-up. He has been using silver alginate  to the wound bed. He has no complaints today. Denies signs of infection. He had an arteriogram done on 5/19. 5/31; angiogram showed patent common femoral, SFA, profunda, above-knee popliteal artery. 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 He does have inline flow down the left lower extremity through the peroneal that is widely patent. The peroneal gave off collaterals distally to the anterior tibial that feels retrograde at the ankle. An attempted recanalization of the left anterior tibial artery was made but was not successful. The plan as outlined by [name] is that he will continue getting aggressive wound care and he would consider a retrograde left anterior tibial intervention if there is no improvement. Wound itself does not look as though it is making any improvement. There is still tunneling superiorly precariously close to bone 6/3; back for the obligatory first total contact cast change. The wound clearly has exposed bone. He is a type II diabetic. He has been revascularized. Plain x- ray did not show osteomyelitis he may require an MRI. After his revascularization I elected to put him into a cast to make sure that all the pressure was coming off this area 6/7; patient back for his routine visit. The wound is not doing well. The patient had a lot of discomfort since he was last here 4 days ago. No systemic illness. He will not go back in the total contact cast today. Objective Constitutional Sitting or standing Blood Pressure is within target range for patient.. Pulse regular and within target range for patient.Marland Kitchen Respirations regular, non-labored and within target range.. Temperature is normal and within the target range for the patient.Marland Kitchen Appears in no distress. Vitals Time Taken: 3:36 PM, Height: 74 in, Weight: 238 lbs, BMI: 30.6, Temperature: 98.5 F, Pulse: 61 bpm, Respiratory Rate: 17  breaths/min, Blood Pressure: 100/61 mmHg, Capillary Blood Glucose: 137 mg/dl. Cardiovascular Pedal pulses palpable. General Notes: Wound exam; debris on the surface I did not debride this. Surface does not look as healthy as it did even 4 days ago. Circumferential undermining. I think there is probably exposed bone. Slight erythema around the wound that is tender. Integumentary (Hair, Skin) Wound #1 status is Open. Original cause of wound was Gradually Appeared. The date acquired was: 06/29/2020. The wound has been in treatment 9 weeks. The wound is located on the Left,Plantar Metatarsal head fifth. The wound measures 1cm length x 1.7cm width x 0.3cm depth; 1.335cm^2 area and 0.401cm^3 volume. There is Fat Layer (Subcutaneous Tissue) exposed. There is no tunneling noted, however, there is undermining starting at 8:00 and ending at 2:00 with a maximum distance of 0.7cm. There is a medium amount of serosanguineous drainage noted. The wound margin is well defined and not attached to the wound base. There is large (67-100%) red, pink granulation within the wound bed. There is a small (1-33%) amount of necrotic tissue within the wound bed including Adherent Slough. Wound #2 status is Healed - Epithelialized. Original cause of wound was Gradually Appeared. The date acquired was: 10/25/2020. The wound has been in treatment 1 weeks. The wound is located on the Left,Dorsal Foot. The wound measures 0cm length x 0cm width x 0cm depth; 0cm^2 area and 0cm^3 volume. Assessment Active Problems ICD-10 Type 2 diabetes mellitus with foot ulcer Type 2 diabetes mellitus with diabetic peripheral angiopathy without gangrene Type 2 diabetes mellitus with diabetic  polyneuropathy Non-pressure chronic ulcer of other part of left foot with other specified severity Cellulitis of left lower limb Plan Follow-up Appointments: Return Appointment in 1 week. - on Tuesday with Dr. Dellia Nims Bathing/ Shower/ Hygiene: May shower  and wash wound with soap and water. - on days changing dressing. Do not soak foot. Edema Control - Lymphedema / SCD / Other: Elevate legs to the level of the heart or above for 30 minutes daily and/or when sitting, a frequency of: - throughout the day Avoid standing for long periods of time. Exercise regularly Additional Orders / Instructions: Follow Nutritious Diet Radiology ordered were: MRI, lower extremity with and without contrast - STAT ORDER FOR MRI LEFT FOOT D/T NON-HEALING ULCER AND POSSIBLE OSTEOMYELITIS The following medication(s) was prescribed: Augmentin oral 875 mg-125 mg tablet 1 tablet oral bid for 7 days for wound infection starting 12/03/2020 sulfamethoxazole-trimethoprim oral 800 mg-160 mg tablet 1 tablet oral bid for 7 days for wouind infection starting 12/03/2020 WOUND #1: - Metatarsal head fifth Wound Laterality: Plantar, Left Cleanser: Soap and Water 1 x Per Day/15 Days Discharge Instructions: May shower and wash wound with dial antibacterial soap and water prior to dressing change. Cleanser: Wound Cleanser (DME) (Generic) 1 x Per Day/15 Days Discharge Instructions: Cleanse the wound with wound cleanser prior to applying a clean dressing using gauze sponges, not tissue or cotton balls. Prim Dressing: KerraCel Ag Gelling Fiber Dressing, 4x5 in (silver alginate) (DME) (Generic) 1 x Per Day/15 Days ary Discharge Instructions: Apply silver alginate to wound bed as instructed Secondary Dressing: Woven Gauze Sponges 2x2 in (DME) (Generic) 1 x Per Day/15 Days Discharge Instructions: Apply over primary dressing as directed. Secondary Dressing: Zetuvit Plus Silicone Border Dressing 4x4 (in/in) (DME) (Generic) 1 x Per Day/15 Days Discharge Instructions: Apply silicone border over primary dressing as directed. Secured With: [employer], 4.5x3.1 (in/yd) (DME) (Generic) 1 x Per Day/15 Days Discharge Instructions: Secure with Kerlix as directed. Secured With: 71M Medipore H Soft  Cloth Surgical T 4 x 2 (in/yd) (DME) (Generic) 1 x Per Day/15 Days ape Discharge Instructions: Secure dressing with tape as directed. 1. I took the patient out of the total contact cast today 2. A culture was done. Empiric Augmentin and Bactrim while we await culture 3. MRI with contrast is soon as possible 4. I suspect the patient probably is developing osteomyelitis. 5. As far as I can tell his PAD which was addressed by Dr. Donzetta Matters still is stable. Electronic Signature(s) Signed: 12/03/2020 5:45:41 PM By: Linton Ham MD Entered By: Linton Ham on 12/03/2020 17:16:08 -------------------------------------------------------------------------------- SuperBill Details Patient Name: Date of Service: [name] [name] 12/03/2020 Medical Record Number: [id number] Patient Account Number: [id number] Date of Birth/Sex: Treating RN: [birth year] (74 y.o. [name], [name] Primary Care Provider: Consuello Masse Other Clinician: Referring Provider: Treating Provider/Extender: Zadie Cleverly in Treatment: 9 Diagnosis Coding ICD-10 Codes Code Description [phone] Type 2 diabetes mellitus with foot ulcer L97.528 Non-pressure chronic ulcer of other part of left foot with other specified severity E11.42 Type 2 diabetes mellitus with diabetic polyneuropathy L03.116 Cellulitis of left lower limb E11.51 Type 2 diabetes mellitus with diabetic peripheral angiopathy without gangrene Facility Procedures CPT4 Code: [id number] Description: 99214 - WOUND CARE VISIT-LEV 4 EST PT Modifier: Quantity: 1 Physician Procedures : CPT4 Code Description Modifier [id number] - WC PHYS LEVEL 4 - EST PT ICD-10 Diagnosis Description L97.528 Non-pressure chronic ulcer of other part of left foot with other specified severity E11.621 Type 2 diabetes mellitus  with foot ulcer L03.116  Cellulitis of left lower limb Quantity: 1 Electronic Signature(s) Signed: 12/03/2020 5:45:41 PM By: Linton Ham  MD Entered By: Linton Ham on 12/03/2020 17:17:10

## 2020-12-04 ENCOUNTER — Other Ambulatory Visit: Payer: Self-pay | Admitting: Internal Medicine

## 2020-12-04 ENCOUNTER — Ambulatory Visit (HOSPITAL_COMMUNITY)
Admission: RE | Admit: 2020-12-04 | Discharge: 2020-12-04 | Disposition: A | Discharge status: Home / Self Care | Payer: Medicare Other | Source: Ambulatory Visit | Attending: Internal Medicine | Admitting: Internal Medicine

## 2020-12-04 ENCOUNTER — Other Ambulatory Visit (HOSPITAL_COMMUNITY): Payer: Self-pay | Admitting: Internal Medicine

## 2020-12-04 ENCOUNTER — Ambulatory Visit: Payer: Medicare Other | Admitting: Urology

## 2020-12-04 ENCOUNTER — Other Ambulatory Visit: Payer: Self-pay

## 2020-12-04 DIAGNOSIS — L97509 Non-pressure chronic ulcer of other part of unspecified foot with unspecified severity: Secondary | ICD-10-CM | POA: Diagnosis not present

## 2020-12-04 DIAGNOSIS — M869 Osteomyelitis, unspecified: Secondary | ICD-10-CM | POA: Insufficient documentation

## 2020-12-04 DIAGNOSIS — E1169 Type 2 diabetes mellitus with other specified complication: Secondary | ICD-10-CM | POA: Diagnosis not present

## 2020-12-04 DIAGNOSIS — E11621 Type 2 diabetes mellitus with foot ulcer: Secondary | ICD-10-CM | POA: Insufficient documentation

## 2020-12-04 DIAGNOSIS — E11622 Type 2 diabetes mellitus with other skin ulcer: Secondary | ICD-10-CM | POA: Diagnosis not present

## 2020-12-04 DIAGNOSIS — L97529 Non-pressure chronic ulcer of other part of left foot with unspecified severity: Secondary | ICD-10-CM | POA: Diagnosis not present

## 2020-12-04 MED ORDER — GADOBUTROL 1 MMOL/ML IV SOLN
10.0000 mL | Freq: Once | INTRAVENOUS | Status: AC | PRN
  Administered 2020-12-04: 10 mL via INTRAVENOUS

## 2020-12-04 NOTE — Progress Notes (Signed)
[name] [name] ([id number]) Visit Report for 11/29/2020 Arrival Information Details Patient Name: Date of Service: [name] [name], [name] [name] 11/29/2020 9:45 A M Medical Record Number: [id number] Patient Account Number: [id number] Date of Birth/Sex: Treating RN: Feb 25, 1946 (75 y.o. [name], [name] Primary Care Tarita Deshmukh: Consuello Masse Other Clinician: Referring Rosevelt Luu: Treating Briahna Pescador/Extender: Zadie Cleverly in Treatment: 9 Visit Information History Since Last Visit Added or deleted any medications: No Patient Arrived: [name] [name] Any new allergies or adverse reactions: No Arrival Time: 10:23 Had a fall or experienced change in No Accompanied By: wife activities of daily living that may affect Transfer Assistance: None risk of falls: Patient Identification Verified: Yes Signs or symptoms of abuse/neglect since last visito No Secondary Verification Process Completed: Yes Hospitalized since last visit: No Patient Requires Transmission-Based Precautions: No Implantable device outside of the clinic excluding No Patient Has Alerts: No cellular tissue based products placed in the center since last visit: Has Dressing in Place as Prescribed: Yes Has Footwear/Offloading in Place as Prescribed: Yes Left: T Contact Cast otal Pain Present Now: No Electronic Signature(s) Signed: 12/02/2020 5:37:32 PM By: Rhae Hammock RN Entered By: Rhae Hammock on 11/29/2020 10:24:13 -------------------------------------------------------------------------------- Encounter Discharge Information Details Patient Name: Date of Service: [name] [name] 11/29/2020 9:45 A M Medical Record Number: [id number] Patient Account Number: [id number] Date of Birth/Sex: Treating RN: Oct 24, 1945 (75 y.o. Janyth Contes Primary Care Iyana Topor: Consuello Masse Other Clinician: Referring Calianna Kim: Treating Takeria Marquina/Extender: Zadie Cleverly in Treatment: 9 Encounter Discharge  Information Items Post Procedure Vitals Discharge Condition: Stable Temperature (F): 98.8 Ambulatory Status: Ambulatory Pulse (bpm): 63 Discharge Destination: Home Respiratory Rate (breaths/min): 17 Transportation: Private Auto Blood Pressure (mmHg): 107/63 Accompanied By: wife Schedule Follow-up Appointment: Yes Clinical Summary of Care: Patient Declined Electronic Signature(s) Signed: 12/04/2020 5:57:05 PM By: Levan Hurst RN, BSN Entered By: Levan Hurst on 11/29/2020 13:25:45 -------------------------------------------------------------------------------- Lower Extremity Assessment Details Patient Name: Date of Service: [name] [name] 11/29/2020 9:45 A M Medical Record Number: [id number] Patient Account Number: [id number] Date of Birth/Sex: Treating RN: May 15, 1946 (75 y.o. [name], [name] Primary Care Airiel Oblinger: Consuello Masse Other Clinician: Referring Tyreona Panjwani: Treating Jayven Naill/Extender: Zadie Cleverly in Treatment: 9 Edema Assessment Assessed: Shirlyn Goltz: Yes] Patrice Paradise: No] Edema: [Left: Ye] [Right: s] Calf Left: Right: Point of Measurement: 40 cm From Medial Instep 36.9 cm Ankle Left: Right: Point of Measurement: 10 cm From Medial Instep 23.5 cm Vascular Assessment Pulses: Dorsalis Pedis Palpable: [Left:Yes] Posterior Tibial Palpable: [Left:Yes] Electronic Signature(s) Signed: 12/02/2020 5:37:32 PM By: Rhae Hammock RN Entered By: Rhae Hammock on 11/29/2020 10:25:01 -------------------------------------------------------------------------------- Multi Wound Chart Details Patient Name: Date of Service: [name] [name] 11/29/2020 9:45 A M Medical Record Number: [id number] Patient Account Number: [id number] Date of Birth/Sex: Treating RN: 19-Apr-1946 (75 y.o. [name] [name] Primary Care Ayjah Show: Consuello Masse Other Clinician: Referring Oley Lahaie: Treating Kien Mirsky/Extender: Zadie Cleverly in Treatment:  9 Vital Signs Height(in): 74 Capillary Blood Glucose(mg/dl): 138 Weight(lbs): 238 Pulse(bpm): 36 Body Mass Index(BMI): 31 Blood Pressure(mmHg): 107/63 Temperature(F): 98.8 Respiratory Rate(breaths/min): 17 Photos: [1:No Photos Left, Plantar Metatarsal head fifth] [2:No Photos Left, Dorsal Foot] [N/A:N/A N/A] Wound Location: [1:Gradually Appeared] [2:Gradually Appeared] [N/A:N/A] Wounding Event: [1:Diabetic Wound/Ulcer of the Lower] [2:Diabetic Wound/Ulcer of the Lower] [N/A:N/A] Primary Etiology: [1:Extremity Sleep Apnea, Arrhythmia, Congestive] [2:Extremity N/A] [N/A:N/A] Comorbid History: [1:Heart Failure, Coronary Artery Disease, Type II Diabetes, Osteoarthritis, Neuropathy 06/29/2020] [2:10/25/2020] [N/A:N/A] Date Acquired: [1:9] [2:0] [N/A:N/A] Weeks of Treatment: [1:Open] [2:Open] [N/A:N/A] Wound Status: [1:1.2x1.7x0.4] [  2:0.2x0.2x0.1] [N/A:N/A] Measurements L x W x D (cm) [1:1.602] [2:0.031] [N/A:N/A] A (cm) : rea [1:0.641] [2:0.003] [N/A:N/A] Volume (cm) : [1:-2156.30%] [2:0.00%] [N/A:N/A] % Reduction in A [1:rea: -2952.40%] [2:0.00%] [N/A:N/A] % Reduction in Volume: [1:12] Starting Position 1 (o'clock): [1:12] Ending Position 1 (o'clock): [1:0.9] Maximum Distance 1 (cm): [1:Yes] [2:N/A] [N/A:N/A] Undermining: [1:Grade 2] [2:Grade 2] [N/A:N/A] Classification: [1:Medium] [2:N/A] [N/A:N/A] Exudate A mount: [1:Serosanguineous] [2:N/A] [N/A:N/A] Exudate Type: [1:red, brown] [2:N/A] [N/A:N/A] Exudate Color: [1:Flat and Intact] [2:N/A] [N/A:N/A] Wound Margin: [1:Large (67-100%)] [2:N/A] [N/A:N/A] Granulation A mount: [1:Pink] [2:N/A] [N/A:N/A] Granulation Quality: [1:Small (1-33%)] [2:N/A] [N/A:N/A] Necrotic A mount: [1:Fat Layer (Subcutaneous Tissue): Yes N/A] [N/A:N/A] Exposed Structures: [1:Fascia: No Tendon: No Muscle: No Joint: No Bone: No Small (1-33%)] [2:N/A] [N/A:N/A] Epithelialization: [1:Debridement - Excisional] [2:N/A] [N/A:N/A] Debridement: Pre-procedure  Verification/Time Out 10:46 [2:N/A] [N/A:N/A] Taken: [1:Subcutaneous] [2:N/A] [N/A:N/A] Tissue Debrided: [1:Skin/Subcutaneous Tissue] [2:N/A] [N/A:N/A] Level: [1:2.04] [2:N/A] [N/A:N/A] Debridement A (sq cm): [1:rea Curette] [2:N/A] [N/A:N/A] Instrument: [1:Minimum] [2:N/A] [N/A:N/A] Bleeding: [1:Pressure] [2:N/A] [N/A:N/A] Hemostasis A chieved: [1:Procedure was tolerated well] [2:N/A] [N/A:N/A] Debridement Treatment Response: [1:1.2x1.7x0.4] [2:N/A] [N/A:N/A] Post Debridement Measurements L x W x D (cm) [1:0.641] [2:N/A] [N/A:N/A] Post Debridement Volume: (cm) [1:Debridement] [2:T Contact Cast otal] [N/A:N/A] Procedures Performed: [1:T Contact Cast otal] Treatment Notes Electronic Signature(s) Signed: 11/29/2020 6:03:14 PM By: Lorrin Jackson Signed: 11/30/2020 7:13:41 AM By: Linton Ham MD Entered By: Linton Ham on 11/29/2020 11:01:09 -------------------------------------------------------------------------------- Multi-Disciplinary Care Plan Details Patient Name: Date of Service: [name] [name] 11/29/2020 9:45 A M Medical Record Number: [id number] Patient Account Number: [id number] Date of Birth/Sex: Treating RN: 07-13-1945 (75 y.o. [name] [name] Primary Care Winter Jocelyn: Consuello Masse Other Clinician: Referring Brendt Dible: Treating Pansey Pinheiro/Extender: Zadie Cleverly in Treatment: 9 Active Inactive Nutrition Nursing Diagnoses: Impaired glucose control: actual or potential Goals: Patient/caregiver verbalizes understanding of need to maintain therapeutic glucose control per primary care physician Date Initiated: 09/27/2020 Target Resolution Date: 12/27/2020 Goal Status: Active Interventions: Assess HgA1c results as ordered upon admission and as needed Assess patient nutrition upon admission and as needed per policy Provide education on elevated blood sugars and impact on wound healing Provide education on nutrition Treatment Activities: Education  provided on Nutrition : 11/26/2020 Notes: 10/25/20: Glucose control ongoing. Electronic Signature(s) Signed: 11/29/2020 6:03:14 PM By: Lorrin Jackson Entered By: Lorrin Jackson on 11/29/2020 10:50:07 -------------------------------------------------------------------------------- Pain Assessment Details Patient Name: Date of Service: [name] [name] 11/29/2020 9:45 A M Medical Record Number: [id number] Patient Account Number: [id number] Date of Birth/Sex: Treating RN: 1946/03/13 (75 y.o. [name], [name] Primary Care Lashanda Storlie: Consuello Masse Other Clinician: Referring Murline Weigel: Treating Roselene Gray/Extender: Zadie Cleverly in Treatment: 9 Active Problems Location of Pain Severity and Description of Pain Patient Has Paino No Site Locations Pain Management and Medication Current Pain Management: Electronic Signature(s) Signed: 12/02/2020 5:37:32 PM By: Rhae Hammock RN Entered By: Rhae Hammock on 11/29/2020 10:24:53 -------------------------------------------------------------------------------- Patient/Caregiver Education Details Patient Name: Date of Service: [name] [name] 6/3/2022andnbsp9:45 [place] Record Number: [id number] Patient Account Number: [id number] Date of Birth/Gender: Treating RN: 1946-03-08 (75 y.o. [name] [name] Primary Care Physician: Consuello Masse Other Clinician: Referring Physician: Treating Physician/Extender: Zadie Cleverly in Treatment: 9 Education Assessment Education Provided To: Patient Education Topics Provided Offloading: Methods: Explain/Verbal, Printed Responses: State content correctly Wound/Skin Impairment: Methods: Explain/Verbal, Printed Responses: State content correctly Electronic Signature(s) Signed: 11/29/2020 6:03:14 PM By: Lorrin Jackson Entered By: Lorrin Jackson on 11/29/2020  10:50:35 -------------------------------------------------------------------------------- Wound Assessment Details Patient Name: Date of Service:  [place], BA RRY G. 11/29/2020 9:45 A M Medical Record Number: [id number] Patient Account Number: [id number] Date of Birth/Sex: Treating RN: [birth year] (75 y.o. [name], [name] Primary Care Jessejames Steelman: Consuello Masse Other Clinician: Referring Falisha Osment: Treating Mionna Advincula/Extender: Zadie Cleverly in Treatment: 9 Wound Status Wound Number: 1 Primary Diabetic Wound/Ulcer of the Lower Extremity Etiology: Wound Location: Left, Plantar Metatarsal head fifth Wound Open Wounding Event: Gradually Appeared Status: Date Acquired: 06/29/2020 Comorbid Sleep Apnea, Arrhythmia, Congestive Heart Failure, Coronary Weeks Of Treatment: 9 History: Artery Disease, Type II Diabetes, Osteoarthritis, Neuropathy Clustered Wound: No Wound Measurements Length: (cm) 1.2 Width: (cm) 1.7 Depth: (cm) 0.4 Area: (cm) 1.602 Volume: (cm) 0.641 % Reduction in Area: -2156.3% % Reduction in Volume: -2952.4% Epithelialization: Small (1-33%) Tunneling: No Undermining: Yes Starting Position (o'clock): 12 Ending Position (o'clock): 12 Maximum Distance: (cm) 0.9 Wound Description Classification: Grade 2 Wound Margin: Flat and Intact Exudate Amount: Medium Exudate Type: Serosanguineous Exudate Color: red, brown Foul Odor After Cleansing: No Slough/Fibrino Yes Wound Bed Granulation Amount: Large (67-100%) Exposed Structure Granulation Quality: Pink Fascia Exposed: No Necrotic Amount: Small (1-33%) Fat Layer (Subcutaneous Tissue) Exposed: Yes Necrotic Quality: Adherent Slough Tendon Exposed: No Muscle Exposed: No Joint Exposed: No Bone Exposed: No Electronic Signature(s) Signed: 12/02/2020 5:37:32 PM By: Rhae Hammock RN Entered By: Rhae Hammock on 11/29/2020  10:42:22 -------------------------------------------------------------------------------- Wound Assessment Details Patient Name: Date of Service: [name] [name] 11/29/2020 9:45 A M Medical Record Number: [id number] Patient Account Number: [id number] Date of Birth/Sex: Treating RN: 1945/12/19 (75 y.o. [name], [name] Primary Care Johnae Friley: Consuello Masse Other Clinician: Referring Trystan Akhtar: Treating Delray Reza/Extender: Zadie Cleverly in Treatment: 9 Wound Status Wound Number: 2 Primary Etiology: Diabetic Wound/Ulcer of the Lower Extremity Wound Location: Left, Dorsal Foot Wound Status: Open Wounding Event: Gradually Appeared Date Acquired: 10/25/2020 Weeks Of Treatment: 0 Clustered Wound: No Wound Measurements Length: (cm) 0.2 Width: (cm) 0.2 Depth: (cm) 0.1 Area: (cm) 0.031 Volume: (cm) 0.003 % Reduction in Area: 0% % Reduction in Volume: 0% Wound Description Classification: Grade 2 Electronic Signature(s) Signed: 12/02/2020 5:37:32 PM By: Rhae Hammock RN Entered By: Rhae Hammock on 11/29/2020 10:25:15 -------------------------------------------------------------------------------- Vitals Details Patient Name: Date of Service: [name] [name] 11/29/2020 9:45 A M Medical Record Number: [id number] Patient Account Number: [id number] Date of Birth/Sex: Treating RN: 01/17/1946 (75 y.o. [name], [name] Primary Care Kayal Mula: Consuello Masse Other Clinician: Referring Ulysess Witz: Treating Adriana Quinby/Extender: Zadie Cleverly in Treatment: 9 Vital Signs Time Taken: 10:24 Temperature (F): 98.8 Height (in): 74 Pulse (bpm): 63 Weight (lbs): 238 Respiratory Rate (breaths/min): 17 Body Mass Index (BMI): 30.6 Blood Pressure (mmHg): 107/63 Capillary Blood Glucose (mg/dl): 138 Reference Range: 80 - 120 mg / dl Electronic Signature(s) Signed: 12/02/2020 5:37:32 PM By: Rhae Hammock RN Entered By: Rhae Hammock on  11/29/2020 10:24:47

## 2020-12-07 LAB — CARBAPENEM RESISTANCE PANEL
Carba Resistance IMP Gene: NOT DETECTED
Carba Resistance KPC Gene: NOT DETECTED
Carba Resistance NDM Gene: NOT DETECTED
Carba Resistance OXA48 Gene: NOT DETECTED
Carba Resistance VIM Gene: NOT DETECTED

## 2020-12-07 LAB — AEROBIC CULTURE W GRAM STAIN (SUPERFICIAL SPECIMEN)

## 2020-12-10 ENCOUNTER — Other Ambulatory Visit: Payer: Self-pay

## 2020-12-10 ENCOUNTER — Encounter (HOSPITAL_BASED_OUTPATIENT_CLINIC_OR_DEPARTMENT_OTHER): Payer: Medicare Other | Admitting: Internal Medicine

## 2020-12-10 DIAGNOSIS — L97522 Non-pressure chronic ulcer of other part of left foot with fat layer exposed: Secondary | ICD-10-CM | POA: Diagnosis not present

## 2020-12-10 DIAGNOSIS — E11621 Type 2 diabetes mellitus with foot ulcer: Secondary | ICD-10-CM | POA: Diagnosis not present

## 2020-12-10 DIAGNOSIS — L97529 Non-pressure chronic ulcer of other part of left foot with unspecified severity: Secondary | ICD-10-CM | POA: Diagnosis not present

## 2020-12-10 DIAGNOSIS — E1142 Type 2 diabetes mellitus with diabetic polyneuropathy: Secondary | ICD-10-CM | POA: Diagnosis not present

## 2020-12-10 DIAGNOSIS — E1151 Type 2 diabetes mellitus with diabetic peripheral angiopathy without gangrene: Secondary | ICD-10-CM | POA: Diagnosis not present

## 2020-12-11 ENCOUNTER — Ambulatory Visit (INDEPENDENT_AMBULATORY_CARE_PROVIDER_SITE_OTHER): Payer: Medicare Other | Admitting: Internal Medicine

## 2020-12-11 ENCOUNTER — Other Ambulatory Visit: Payer: Self-pay

## 2020-12-11 ENCOUNTER — Telehealth: Payer: Self-pay

## 2020-12-11 ENCOUNTER — Encounter: Payer: Self-pay | Admitting: Internal Medicine

## 2020-12-11 VITALS — BP 122/67 | HR 104 | Temp 98.4°F | Wt 223.0 lb

## 2020-12-11 DIAGNOSIS — I739 Peripheral vascular disease, unspecified: Secondary | ICD-10-CM

## 2020-12-11 DIAGNOSIS — M869 Osteomyelitis, unspecified: Secondary | ICD-10-CM

## 2020-12-11 DIAGNOSIS — E1169 Type 2 diabetes mellitus with other specified complication: Secondary | ICD-10-CM

## 2020-12-11 DIAGNOSIS — L97509 Non-pressure chronic ulcer of other part of unspecified foot with unspecified severity: Secondary | ICD-10-CM

## 2020-12-11 DIAGNOSIS — A499 Bacterial infection, unspecified: Secondary | ICD-10-CM | POA: Diagnosis not present

## 2020-12-11 DIAGNOSIS — E11621 Type 2 diabetes mellitus with foot ulcer: Secondary | ICD-10-CM

## 2020-12-11 DIAGNOSIS — I255 Ischemic cardiomyopathy: Secondary | ICD-10-CM | POA: Diagnosis not present

## 2020-12-11 MED ORDER — SULFAMETHOXAZOLE-TRIMETHOPRIM 800-160 MG PO TABS: 1.0000 | ORAL_TABLET | Freq: Every day | ORAL | 0 refills | Status: DC

## 2020-12-11 NOTE — Progress Notes (Signed)
WILKINS, ELPERS ([id number]) Visit Report for 12/10/2020 Arrival Information Details Patient Name: Date of Service: TAMMIE, ELLSWORTH 12/10/2020 10:30 A M Medical Record Number: [id number] Patient Account Number: [id number] Date of Birth/Sex: Treating RN: 1946/04/06 (75 y.o. [name], [name] Primary Care Iwalani Templeton: Consuello Masse Other Clinician: Referring Kamaree Wheatley: Treating Serafina Topham/Extender: Zadie Cleverly in Treatment: 10 Visit Information History Since Last Visit Added or deleted any medications: No Patient Arrived: [name] Any new allergies or adverse reactions: No Arrival Time: 11:04 Had a fall or experienced change in No Accompanied By: daughter activities of daily living that may affect Transfer Assistance: None risk of falls: Patient Identification Verified: Yes Signs or symptoms of abuse/neglect since last visito No Secondary Verification Process Completed: Yes Hospitalized since last visit: No Patient Requires Transmission-Based Precautions: No Implantable device outside of the clinic excluding No Patient Has Alerts: No cellular tissue based products placed in the center since last visit: Has Dressing in Place as Prescribed: Yes Pain Present Now: Yes Electronic Signature(s) Signed: 12/10/2020 4:42:14 PM By: Sandre Kitty Entered By: Sandre Kitty on 12/10/2020 11:04:29 -------------------------------------------------------------------------------- Encounter Discharge Information Details Patient Name: Date of Service: [name] 12/10/2020 10:30 A M Medical Record Number: [id number] Patient Account Number: [id number] Date of Birth/Sex: Treating RN: 17-Jul-1945 (74 y.o. [name] Primary Care Nayla Dias: Consuello Masse Other Clinician: Referring Hiawatha Merriott: Treating Mahad Newstrom/Extender: Zadie Cleverly in Treatment: 10 Encounter Discharge Information Items Post Procedure Vitals Discharge Condition:  Stable Temperature (F): 98.8 Ambulatory Status: Cane Pulse (bpm): 63 Discharge Destination: Home Respiratory Rate (breaths/min): 17 Transportation: Private Auto Blood Pressure (mmHg): 109/54 Accompanied By: Daughter Schedule Follow-up Appointment: Yes Clinical Summary of Care: Provided on 12/10/2020 Form Type Recipient Paper Patient Patient Electronic Signature(s) Signed: 12/10/2020 12:33:52 PM By: Lorrin Jackson Entered By: Lorrin Jackson on 12/10/2020 12:33:52 -------------------------------------------------------------------------------- Lower Extremity Assessment Details Patient Name: Date of Service: [name] 12/10/2020 10:30 A M Medical Record Number: [id number] Patient Account Number: [id number] Date of Birth/Sex: Treating RN: 28-Jun-1946 (74 y.o. Ernestene Mention Primary Care Bengie Kaucher: Consuello Masse Other Clinician: Referring Kaaliyah Kita: Treating Mariea Mcmartin/Extender: Zadie Cleverly in Treatment: 10 Edema Assessment Assessed: Shirlyn Goltz: No] Patrice Paradise: No] Edema: [Left: Ye] [Right: s] Calf Left: Right: Point of Measurement: 40 cm From Medial Instep 36.8 cm Ankle Left: Right: Point of Measurement: 10 cm From Medial Instep 24.4 cm Vascular Assessment Pulses: Dorsalis Pedis Palpable: [Left:No] Electronic Signature(s) Signed: 12/10/2020 6:10:01 PM By: Baruch Gouty RN, BSN Entered By: Baruch Gouty on 12/10/2020 11:16:24 -------------------------------------------------------------------------------- Multi Wound Chart Details Patient Name: Date of Service: [name] 12/10/2020 10:30 A M Medical Record Number: [id number] Patient Account Number: [id number] Date of Birth/Sex: Treating RN: 1946/04/23 (74 y.o. [name], [name] Primary Care Liller Yohn: Consuello Masse Other Clinician: Referring Trayvion Embleton: Treating Shaneta Cervenka/Extender: Zadie Cleverly in Treatment: 10 Vital Signs Height(in): 74 Capillary Blood  Glucose(mg/dl): 149 Weight(lbs): 238 Pulse(bpm): 11 Body Mass Index(BMI): 31 Blood Pressure(mmHg): 109/54 Temperature(F): 98.8 Respiratory Rate(breaths/min): 17 Photos: [1:No Photos Left, Plantar Metatarsal head fifth] [N/A:N/A N/A] Wound Location: [1:Gradually Appeared] [N/A:N/A] Wounding Event: [1:Diabetic Wound/Ulcer of the Lower] [N/A:N/A] Primary Etiology: [1:Extremity Sleep Apnea, Arrhythmia, Congestive] [N/A:N/A] Comorbid History: [1:Heart Failure, Coronary Artery Disease, Type II Diabetes, Osteoarthritis, Neuropathy 06/29/2020] [N/A:N/A] Date Acquired: [1:10] [N/A:N/A] Weeks of Treatment: [1:Open] [N/A:N/A] Wound Status: [1:1.3x1.7x0.3] [N/A:N/A] Measurements L x W x D (cm) [1:1.736] [N/A:N/A] A (cm) : rea [1:0.521] [N/A:N/A] Volume (cm) : [1:-2345.10%] [N/A:N/A] % Reduction in A rea: [1:-2381.00%] [N/A:N/A] %  Reduction in Volume: [1:Grade 3] [N/A:N/A] Classification: [1:MRI] [N/A:N/A] Wagner Verification: [1:Medium] [N/A:N/A] Exudate A mount: [1:Serosanguineous] [N/A:N/A] Exudate Type: [1:red, brown] [N/A:N/A] Exudate Color: [1:Well defined, not attached] [N/A:N/A] Wound Margin: [1:None Present (0%)] [N/A:N/A] Granulation A mount: [1:Large (67-100%)] [N/A:N/A] Necrotic A mount: [1:Fat Layer (Subcutaneous Tissue): Yes N/A] Exposed Structures: [1:Bone: Yes Fascia: No Tendon: No Muscle: No Joint: No None] [N/A:N/A] Epithelialization: [1:Debridement - Excisional] [N/A:N/A] Debridement: Pre-procedure Verification/Time Out 11:25 [N/A:N/A] Taken: [1:Lidocaine 4% Topical Solution] [N/A:N/A] Pain Control: [1:Subcutaneous, Slough] [N/A:N/A] Tissue Debrided: [1:Skin/Subcutaneous Tissue] [N/A:N/A] Level: [1:2.21] [N/A:N/A] Debridement A (sq cm): [1:rea Forceps, Scissors] [N/A:N/A] Instrument: [1:Minimum] [N/A:N/A] Bleeding: [1:Pressure] [N/A:N/A] Hemostasis A chieved: [1:0] [N/A:N/A] Procedural Pain: [1:0] [N/A:N/A] Post Procedural Pain: [1:Procedure was tolerated well]  [N/A:N/A] Debridement Treatment Response: [1:1.3x1.7x0.3] [N/A:N/A] Post Debridement Measurements L x W x D (cm) [1:0.521] [N/A:N/A] Post Debridement Volume: (cm) [1:Debridement] [N/A:N/A] Treatment Notes Electronic Signature(s) Signed: 12/10/2020 4:29:11 PM By: Linton Ham MD Signed: 12/11/2020 6:22:58 PM By: Rhae Hammock RN Entered By: Linton Ham on 12/10/2020 12:27:32 -------------------------------------------------------------------------------- Multi-Disciplinary Care Plan Details Patient Name: Date of Service: [name]. 12/10/2020 10:30 A M Medical Record Number: [id number] Patient Account Number: [id number] Date of Birth/Sex: Treating RN: 1945-09-06 (74 y.o. [name] Primary Care Harmonie Verrastro: Consuello Masse Other Clinician: Referring Elijiah Mickley: Treating Keola Heninger/Extender: Zadie Cleverly in Treatment: 10 Active Inactive Nutrition Nursing Diagnoses: Impaired glucose control: actual or potential Goals: Patient/caregiver verbalizes understanding of need to maintain therapeutic glucose control per primary care physician Date Initiated: 09/27/2020 Target Resolution Date: 12/27/2020 Goal Status: Active Interventions: Assess HgA1c results as ordered upon admission and as needed Assess patient nutrition upon admission and as needed per policy Provide education on elevated blood sugars and impact on wound healing Provide education on nutrition Treatment Activities: Education provided on Nutrition : 11/26/2020 Notes: 10/25/20: Glucose control ongoing. Electronic Signature(s) Signed: 12/10/2020 6:05:55 PM By: Deon Pilling Entered By: Deon Pilling on 12/10/2020 11:09:15 -------------------------------------------------------------------------------- Pain Assessment Details Patient Name: Date of Service: MACOY, RODWELL 12/10/2020 10:30 A M Medical Record Number: [id number] Patient Account Number: [id number] Date of  Birth/Sex: Treating RN: 1945-10-29 (75 y.o. [name] Primary Care Herbert Marken: Consuello Masse Other Clinician: Referring Vrishank Moster: Treating Karie Skowron/Extender: Zadie Cleverly in Treatment: 10 Active Problems Location of Pain Severity and Description of Pain Patient Has Paino Yes Site Locations Rate the pain. Current Pain Level: 3 Pain Management and Medication Current Pain Management: Electronic Signature(s) Signed: 12/10/2020 4:42:14 PM By: Sandre Kitty Signed: 12/11/2020 6:22:58 PM By: Rhae Hammock RN Entered By: Sandre Kitty on 12/10/2020 11:06:03 -------------------------------------------------------------------------------- Patient/Caregiver Education Details Patient Name: Date of Service: [name] 6/14/2022andnbsp10:30 [place] Record Number: [id number] Patient Account Number: [id number] Date of Birth/Gender: Treating RN: 26-Jun-1946 (75 y.o. [name] Primary Care Physician: Consuello Masse Other Clinician: Referring Physician: Treating Physician/Extender: Zadie Cleverly in Treatment: 10 Education Assessment Education Provided To: Patient Education Topics Provided Electronic Signature(s) Signed: 12/10/2020 6:05:55 PM By: Deon Pilling Entered By: Deon Pilling on 12/10/2020 11:09:24 -------------------------------------------------------------------------------- Wound Assessment Details Patient Name: Date of Service: SINCERE, LIUZZI 12/10/2020 10:30 A M Medical Record Number: [id number] Patient Account Number: [id number] Date of Birth/Sex: Treating RN: 02-22-1946 (75 y.o. [name] Primary Care Chaniqua Brisby: Consuello Masse Other Clinician: Referring Anees Vanecek: Treating Alithea Lapage/Extender: Zadie Cleverly in Treatment: 10 Wound Status Wound Number: 1 Primary Diabetic Wound/Ulcer of the Lower Extremity Etiology: Wound Location: Left, Plantar Metatarsal head  fifth Wound Open  Wounding Event: Gradually Appeared Status: Date Acquired: 06/29/2020 Comorbid Sleep Apnea, Arrhythmia, Congestive Heart Failure, Coronary Weeks Of Treatment: 10 History: Artery Disease, Type II Diabetes, Osteoarthritis, Neuropathy Clustered Wound: No Photos Wound Measurements Length: (cm) 1.3 Width: (cm) 1.7 Depth: (cm) 0.3 Area: (cm) 1.736 Volume: (cm) 0.521 % Reduction in Area: -2345.1% % Reduction in Volume: -2381% Epithelialization: None Tunneling: No Undermining: No Wound Description Classification: Grade 3 Wound Margin: Well defined, not attached Exudate Amount: Medium Exudate Type: Serosanguineous Exudate Color: red, brown Foul Odor After Cleansing: No Slough/Fibrino Yes Wound Bed Granulation Amount: None Present (0%) Exposed Structure Necrotic Amount: Large (67-100%) Fascia Exposed: No Necrotic Quality: Adherent Slough Fat Layer (Subcutaneous Tissue) Exposed: Yes Tendon Exposed: No Muscle Exposed: No Joint Exposed: No Bone Exposed: Yes Treatment Notes Wound #1 (Metatarsal head fifth) Wound Laterality: Plantar, Left Cleanser Wound Cleanser Discharge Instruction: Cleanse the wound with wound cleanser prior to applying a clean dressing using gauze sponges, not tissue or cotton balls. Soap and Water Discharge Instruction: May shower and wash wound with dial antibacterial soap and water prior to dressing change. Peri-Wound Care Topical Primary Dressing KerraCel Ag Gelling Fiber Dressing, 4x5 in (silver alginate) Discharge Instruction: Apply silver alginate to wound bed as instructed Secondary Dressing Woven Gauze Sponges 2x2 in Discharge Instruction: Apply over primary dressing as directed. Zetuvit Plus Silicone Border Dressing 4x4 (in/in) Discharge Instruction: Apply silicone border over primary dressing as directed. Secured With [employer], 4.5x3.1 (in/yd) Discharge Instruction: Secure with Kerlix as directed. 38M Medipore H  Soft Cloth Surgical T 4 x 2 (in/yd) ape Discharge Instruction: Secure dressing with tape as directed. Compression Wrap Compression Stockings Add-Ons Electronic Signature(s) Signed: 12/10/2020 4:42:14 PM By: Sandre Kitty Signed: 12/11/2020 6:22:58 PM By: Rhae Hammock RN Entered By: Sandre Kitty on 12/10/2020 16:35:40 -------------------------------------------------------------------------------- Vitals Details Patient Name: Date of Service: [name] 12/10/2020 10:30 A M Medical Record Number: [id number] Patient Account Number: [id number] Date of Birth/Sex: Treating RN: [birth year] (74 y.o. [name], [name] Primary Care Lam Mccubbins: Consuello Masse Other Clinician: Referring Shaelin Lalley: Treating Alara Daniel/Extender: Zadie Cleverly in Treatment: 10 Vital Signs Time Taken: 11:04 Temperature (F): 98.8 Height (in): 74 Pulse (bpm): 63 Weight (lbs): 238 Respiratory Rate (breaths/min): 17 Body Mass Index (BMI): 30.6 Blood Pressure (mmHg): 109/54 Capillary Blood Glucose (mg/dl): 149 Reference Range: 80 - 120 mg / dl Electronic Signature(s) Signed: 12/10/2020 4:42:14 PM By: Sandre Kitty Entered By: Sandre Kitty on 12/10/2020 11:05:51

## 2020-12-11 NOTE — Progress Notes (Signed)
Patient ID: [name] [name], male   DOB: 1945/08/17, 75 y.o.   MRN: [id number]  HPI 75yo M with history of IDDM, CAD, HLD, on anticoagulation. Has had Left foot ulcer since last fall where he has had Wound care since April 1st. He has been on Augmentin and bactrim plus cast to off load, but Now removed 1 week, now wearing post op shoe Still unchanged. 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 Deep wound cx +e.cloacae. He was referred for evaluation for iv abtx and recommendations for management.  He denies fever or significant drainage from his foot. Has not hasd recent evaluation of vascular studies  Outpatient Encounter Medications as of 12/11/2020  Medication Sig   alfuzosin (UROXATRAL) 10 MG 24 hr tablet Take 1 tablet (10 mg total) by mouth daily with breakfast. (Patient taking differently: Take 10 mg by mouth at bedtime.)   aspirin EC 81 MG tablet Take 81 mg by mouth at bedtime.    carvedilol (COREG) 6.25 MG tablet Take 1 tablet (6.25 mg total) by mouth 2 (two) times daily.   ELIQUIS 5 MG TABS tablet Take 1 tablet by mouth twice daily (Patient taking differently: Take 5 mg by mouth in the morning and at bedtime.)   empagliflozin (JARDIANCE) 10 MG TABS tablet Take 1 tablet (10 mg total) by mouth daily before breakfast.   furosemide (LASIX) 40 MG tablet Take 40 mg by mouth in the morning.   gabapentin (NEURONTIN) 300 MG capsule Take 600 mg by mouth at bedtime.   glipiZIDE (GLUCOTROL XL) 10 MG 24 hr tablet Take 10 mg by mouth at bedtime.   metFORMIN (GLUCOPHAGE-XR) 500 MG 24 hr tablet Take 1,000 mg by mouth 2 (two) times daily.   ONETOUCH VERIO test strip 1 each daily.   rosuvastatin (CRESTOR) 20 MG tablet Take 10 mg by mouth every other day. In the evening   losartan (COZAAR) 25 MG tablet Take 1 tablet (25 mg total) by mouth daily. (Patient not taking: Reported on 11/11/2020)   tadalafil (CIALIS) 20 MG tablet Take 1 tablet (20 mg total) by mouth as needed. (Patient not taking:  Reported on 11/11/2020)   No facility-administered encounter medications on file as of 12/11/2020.     Patient Active Problem List   Diagnosis Date Noted   Erectile dysfunction due to arterial insufficiency 06/25/2020   Benign prostatic hyperplasia 05/17/2020   Weak urinary stream 05/17/2020   Atrial fibrillation ([place])    Pseudoclaudication 08/30/2017   Abnormal EKG 08/10/2017   Peripheral arterial disease ([place]) 04/21/2017   Oxygen desaturation 09/38/1829   Chronic systolic heart failure ([place]) 05/27/2016   Obesity (BMI 30-39.9) 02/16/2016   Non-ischemic cardiomyopathy ([place]) 12/24/2015   Medication management 12/24/2015   SOB (shortness of breath) 12/24/2015   LV dysfunction    Acute systolic congestive heart failure, NYHA class 3 ([place]) 09/12/2015   OSA (obstructive sleep apnea) 04/17/2015   Spinal stenosis of lumbar region 09/12/2014   Spinal stenosis at L4-L5 level 09/12/2014   Dyspnea 10/13/2013   Fatigue 10/13/2013   Hypotension 08/09/2013   Chest pain 08/09/2013   S/P CABG x 3: (LIMA-LAD, SVG-PD, SVG-PL) 08/04/2011   Coronary artery disease involving native coronary artery of native heart without angina pectoris 07/06/2011   Vasovagal syncope 06/03/2011   Dehydration 06/02/2011   Ischemic cardiomyopathy 06/02/2011   DM2 (diabetes mellitus, type 2) ([place]) 06/02/2011   Essential hypertension 06/02/2011     Health Maintenance Due  Topic Date Due   FOOT EXAM  Never  done   OPHTHALMOLOGY EXAM  Never done   URINE MICROALBUMIN  Never done   Hepatitis C Screening  Never done   TETANUS/TDAP  Never done   Zoster Vaccines- Shingrix (1 of 2) Never done   PNA vac Low Risk Adult (1 of 2 - PCV13) Never done   COVID-19 Vaccine (3 - Moderna risk series) 09/07/2019    Social History   Tobacco Use   Smoking status: Former    Pack years: 0.00    Types: Cigars    Quit date: 09/07/2011    Years since quitting: 9.3   Smokeless tobacco: Former    Quit date: 02/28/2012  Substance Use  Topics   Alcohol use: Yes    Alcohol/week: 0.0 standard drinks    Comment: occasional   Drug use: No   family history includes Anesthesia problems in his daughter; Cancer in his mother; Colon cancer in his sister; Lung disease in his father; Sudden death in his maternal grandmother.  Review of Systems 12 point ros is negativ e except what is mentioned in hpi Physical Exam   BP 122/67   Pulse (!) 104   Temp 98.4 F (36.9 C)   Wt 223 lb (101.2 kg)   SpO2 98%   BMI 28.63 kg/m    Physical Exam  Constitutional: He is oriented to person, place, and time. He appears well-developed and well-nourished. No distress.  HENT:  Mouth/Throat: Oropharynx is clear and moist. No oropharyngeal exudate.  Cardiovascular: Normal rate, regular rhythm and normal heart sounds. Exam reveals no gallop and no friction rub.  No murmur heard.  Ext: left foot plantar ulcer some serosanginous drainage on dressing and discoloration Neurological: He is alert and oriented to person, place, and time.  Skin: Skin is warm and dry. No rash noted. No erythema. +onychomycosis Psychiatric: He has a normal mood and affect. His behavior is normal.    CBC Lab Results  Component Value Date   WBC 5.4 10/01/2020   RBC 4.38 10/01/2020   HGB 11.9 (L) 11/14/2020   HCT 35.0 (L) 11/14/2020   PLT 209 10/01/2020   MCV 88 10/01/2020   MCH 27.9 10/01/2020   MCHC 31.6 10/01/2020   RDW 13.5 10/01/2020   LYMPHSABS 1.4 08/20/2015   MONOABS 0.6 08/20/2015   EOSABS 0.2 08/20/2015    BMET Lab Results  Component Value Date   NA 140 11/14/2020   K 4.1 11/14/2020   CL 106 11/14/2020   CO2 19 (L) 10/17/2020   GLUCOSE 170 (H) 11/14/2020   BUN 22 11/14/2020   CREATININE 1.00 11/14/2020   CALCIUM 9.0 10/17/2020   GFRNONAA 69 01/22/2020   GFRAA 80 01/22/2020      Assessment and Plan DFU/osteomyelitis = for the time being it is difficult to say if amputation is the better alternative for the patient without his ABI  evaluation. He has upcoming appt with dr Carlis Abbott for evaluation of vascularature. Mentioned that it is very diffcult to treat osteomyelitis without surgical debridement. If he has good vasculature (hx of PAD) he maybe a candidate for 5th ray amputation.  DM = appears well controlled Hbga1c 6.8  Will coordinate for picc line with carbapenem x 4 wk, transition to oral abtx thereafter  Continue with local wound care

## 2020-12-11 NOTE — Telephone Encounter (Signed)
I have spoke to [name] [name] with short stay and let her know patient is scheduled for picc line placement on 12/17/20 @ 10:00 am and he will need first dose with them. Kristen verbalized understanding.  Kendyl Bissonnette T Brooks Sailors

## 2020-12-11 NOTE — Telephone Encounter (Signed)
Called and spoke with Caryl Pina in IR to schedule appt for PICC line , pt was set up PICC on 12/17/20.@10  am pt will need to arrive at 9:45 am. Faxed order to [facility] and [facility]. Waiting to hear from [facility] for an Appt and [facility].

## 2020-12-11 NOTE — Telephone Encounter (Signed)
Called and spoke with pt 's daughter [name] [name]  regarding his appt for PICC line and antibiotic. Informed his daughter that his refill his antibiotic Bacterium  was sent in the pharmacy and that his appt for the Picc is for 12/17/20 he will need to arrive at [facility] at 945am and then go to Short Stay for his medication. Also I advised I will call the pt the with price of infusion once [facility] call us back  with pricing.

## 2020-12-12 LAB — CBC WITH DIFFERENTIAL/PLATELET
Absolute Monocytes: 643 cells/uL (ref 200–950)
Basophils Absolute: 63 cells/uL (ref 0–200)
Basophils Relative: 1 %
Eosinophils Absolute: 271 cells/uL (ref 15–500)
Eosinophils Relative: 4.3 %
HCT: 39.6 % (ref 38.5–50.0)
Hemoglobin: 13 g/dL — ABNORMAL LOW (ref 13.2–17.1)
Lymphs Abs: 1329 cells/uL (ref 850–3900)
MCH: 28.6 pg (ref 27.0–33.0)
MCHC: 32.8 g/dL (ref 32.0–36.0)
MCV: 87.2 fL (ref 80.0–100.0)
MPV: 9.6 fL (ref 7.5–12.5)
Monocytes Relative: 10.2 %
Neutro Abs: 3994 cells/uL (ref 1500–7800)
Neutrophils Relative %: 63.4 %
Platelets: 293 10*3/uL (ref 140–400)
RBC: 4.54 10*6/uL (ref 4.20–5.80)
RDW: 13.7 % (ref 11.0–15.0)
Total Lymphocyte: 21.1 %
WBC: 6.3 10*3/uL (ref 3.8–10.8)

## 2020-12-12 LAB — C-REACTIVE PROTEIN: CRP: 28.2 mg/L — ABNORMAL HIGH (ref <8.0)

## 2020-12-12 LAB — BASIC METABOLIC PANEL
BUN: 22 mg/dL (ref 7–25)
CO2: 22 mmol/L (ref 20–32)
Calcium: 9.3 mg/dL (ref 8.6–10.3)
Chloride: 104 mmol/L (ref 98–110)
Creat: 1.14 mg/dL (ref 0.70–1.18)
Glucose, Bld: 180 mg/dL — ABNORMAL HIGH (ref 65–99)
Potassium: 5 mmol/L (ref 3.5–5.3)
Sodium: 137 mmol/L (ref 135–146)

## 2020-12-12 LAB — SEDIMENTATION RATE: Sed Rate: 33 mm/h — ABNORMAL HIGH (ref 0–20)

## 2020-12-12 NOTE — Telephone Encounter (Signed)
Mary from [facility] called, patient's copay will be $60.85 per day.   Beryle Flock, RN

## 2020-12-12 NOTE — Telephone Encounter (Addendum)
Patient called back, said he and his daughter spoke with [facility], would like to continue with the plan for PICC placement and antibiotic infusion. RN confirmed PICC placement appointment 12/17/20 10:00 with first dose to follow. [facility] aware. Landis Gandy, RN

## 2020-12-12 NOTE — Telephone Encounter (Signed)
Patient's daughter called with questions about insurance copays, deductible, how nursing is being billed. RN gave her phone number to [place] to discuss this with [facility]. Landis Gandy, RN

## 2020-12-13 ENCOUNTER — Other Ambulatory Visit (HOSPITAL_COMMUNITY): Payer: Self-pay | Admitting: *Deleted

## 2020-12-13 DIAGNOSIS — E1165 Type 2 diabetes mellitus with hyperglycemia: Secondary | ICD-10-CM | POA: Diagnosis not present

## 2020-12-13 DIAGNOSIS — E782 Mixed hyperlipidemia: Secondary | ICD-10-CM | POA: Diagnosis not present

## 2020-12-13 DIAGNOSIS — E1140 Type 2 diabetes mellitus with diabetic neuropathy, unspecified: Secondary | ICD-10-CM | POA: Diagnosis not present

## 2020-12-13 DIAGNOSIS — I10 Essential (primary) hypertension: Secondary | ICD-10-CM | POA: Diagnosis not present

## 2020-12-13 DIAGNOSIS — E7849 Other hyperlipidemia: Secondary | ICD-10-CM | POA: Diagnosis not present

## 2020-12-13 DIAGNOSIS — N183 Chronic kidney disease, stage 3 unspecified: Secondary | ICD-10-CM | POA: Diagnosis not present

## 2020-12-13 DIAGNOSIS — E1122 Type 2 diabetes mellitus with diabetic chronic kidney disease: Secondary | ICD-10-CM | POA: Diagnosis not present

## 2020-12-13 NOTE — Progress Notes (Signed)
[name] [name], [name] [name] ([id number]) Visit Report for 12/10/2020 Debridement Details Patient Name: Date of Service: [name] [name], [name] [name] 12/10/2020 10:30 A M Medical Record Number: [id number] Patient Account Number: [id number] Date of Birth/Sex: Treating RN: [birth year] (75 y.o. [name] [name], [name] Primary Care Provider: Consuello Masse Other Clinician: Referring Provider: Treating Provider/Extender: Zadie Cleverly in Treatment: 10 Debridement Performed for Assessment: Wound #1 Left,Plantar Metatarsal head fifth Performed By: Physician Ricard Dillon., MD Debridement Type: Debridement Severity of Tissue Pre Debridement: Fat layer exposed Level of Consciousness (Pre-procedure): Awake and Alert Pre-procedure Verification/Time Out Yes - 11:25 Taken: Start Time: 11:26 Pain Control: Lidocaine 4% T opical Solution T Area Debrided (L x W): otal 1.3 (cm) x 1.7 (cm) = 2.21 (cm) Tissue and other material debrided: Viable, Non-Viable, Slough, Subcutaneous, Skin: Dermis , Skin: Epidermis, Fibrin/Exudate, Slough Level: Skin/Subcutaneous Tissue Debridement Description: Excisional Instrument: Forceps, Scissors Bleeding: Minimum Hemostasis Achieved: Pressure End Time: 11:29 Procedural Pain: 0 Post Procedural Pain: 0 Response to Treatment: Procedure was tolerated well Level of Consciousness (Post- Awake and Alert procedure): Post Debridement Measurements of Total Wound Length: (cm) 1.3 Width: (cm) 1.7 Depth: (cm) 0.3 Volume: (cm) 0.521 Character of Wound/Ulcer Post Debridement: Requires Further Debridement Severity of Tissue Post Debridement: Fat layer exposed Post Procedure Diagnosis Same as Pre-procedure Electronic Signature(s) Signed: 12/10/2020 4:29:11 PM By: Linton Ham MD Signed: 12/11/2020 6:22:58 PM By: Rhae Hammock RN Entered By: Linton Ham on 12/10/2020 12:27:43 -------------------------------------------------------------------------------- HPI  Details Patient Name: Date of Service: [name] [name] 12/10/2020 10:30 A M Medical Record Number: [id number] Patient Account Number: [id number] Date of Birth/Sex: Treating RN: [birth year] (75 y.o. [name] [name] Primary Care Provider: Consuello Masse Other Clinician: Referring Provider: Treating Provider/Extender: Zadie Cleverly in Treatment: 10 History of Present Illness HPI Description: ADMISSION 09/27/2020 This is a 75 year old man who lives in [place]. He apparently has had callus over the plantar fifth metatarsal head in the past for which she is followed by podiatry in [place]. They shaved down the callus and this was done in January. He states that he went to [place] in January and became aware of when he was there of an open wound in this area. He followed up with podiatry and has been soaking this twice a day with Epson salts and using Silvadene. Apparently one of the podiatrist told him he may need surgery because of bone protrusion. Past medical history includes an ischemic cardiomyopathy, type 2 diabetes with peripheral neuropathy, BPH, PVD, orthostatic hypotension, atrial fibrillation, hyperlipidemia and hypertension Arterial studies in 2018 showed a noncompressible ABI on the left. It was again noncompressible today at 1.7 although his pulses are easily palpable 4/8; patient I admitted to the clinic last week. Wound on the plantar left fifth metatarsal head which has been refractory. He also has a bit of subluxation of the bone in the head. He is a type II diabetic with peripheral neuropathy. We used silver collagen after debridement He arrives back in clinic today. He has erythema spreading into the lateral part of the fifth metatarsal head. I removed some undermining tissue from around the wound and clearly there is a connection between the wound and the lateral part of the fifth met head. A culture was done. Area of erythema on the lateral part  of the foot was marked. His wife says that has been there since Wednesday 4/15; the patient arrived last week with erythema spreading in the lateral part of the fifth metatarsal head. There  was a wound connected with the plantar fifth met head original wound. I gave him empiric Augmentin. Surprisingly the culture I did was negative. We have been using silver alginate. The wound has been open since January. He arrives in clinic today with the erythema much better 4/22; patient presents today for 1 week follow-up of his fifth metatarsal head wound. Daughter is present with the patient. She states that the wound has improved significantly since 2 weeks ago. There is no longer redness to the foot and the actual wound bed is smaller. Patient overall feels well and has no complaints today. 4/29; patient presents for 1 week follow-up of his fifth metatarsal head wound. Daughter is present. Patient has been using silver alginate every other day to the wound site. He has tried to relieve pressure with a surgical shoe. He denies signs of infections. 5/6; fifth metatarsal head wound. He has been using silver alginate changed to Santyl last week which he managed to obtain for $21 with a coupon we gave him. He also has an appointment with vein and vascular. I think Dr. Heber [place] referred him. He has noncompressible ABIs. The patient also had an x-ray and that was negative 5/13; the patient went to see [name]. Areas on the left fifth metatarsal head plantar and lateral. [name] noted that his ABI in the right was 0.99 but monophasic waveforms with a TBI at 0.32. He wanted to go ahead with an angiogram which is booked for Thursday. I talked to the patient about this and advised him to go forward with this. 5/23; patient presents for 1 week follow-up. He has been using silver alginate to the wound bed. He has no complaints today. Denies signs of infection. He had an arteriogram done on 5/19. 5/31; angiogram  showed patent common femoral, SFA, profunda, above-knee popliteal artery. 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 He does have inline flow down the left lower extremity through the peroneal that is widely patent. The peroneal gave off collaterals distally to the anterior tibial that feels retrograde at the ankle. An attempted recanalization of the left anterior tibial artery was made but was not successful. The plan as outlined by [name] is that he will continue getting aggressive wound care and he would consider a retrograde left anterior tibial intervention if there is no improvement. Wound itself does not look as though it is making any improvement. There is still tunneling superiorly precariously close to bone 6/3; back for the obligatory first total contact cast change. The wound clearly has exposed bone. He is a type II diabetic. He has been revascularized. Plain x- ray did not show osteomyelitis he may require an MRI. After his revascularization I elected to put him into a cast to make sure that all the pressure was coming off this area 6/7; patient back for his routine visit. The wound is not doing well. The patient had a lot of discomfort since he was last here 4 days ago. No systemic illness. He will not go back in the total contact cast today. 6/14; culture I did last time showed Enterobacter. At the time there was erythema and swelling I put him on Augmentin and Bactrim empirically. The Enterobacter was sensitive to the Bactrim DS. He sees [name] tomorrow. I am looking for her recommendation about antibiotic choices and route of administration. He sees [name] of vascular surgery on 6/21. I have communicated  with [name] I would like to see whether or not further revascularization will be entertained and whether [name] feels that he could survive a foot conserving surgery if that is the  route the patient wishes to go. After these next 2 consultations we may be able to discuss the route the patient wants to take either a surgical 1 or perhaps 1 that involves a prolonged course of antibiotics and hyperbaric oxygen. I touch bases with the patient and his daughter on all of this. MRI suggested osteomyelitis in the distal 2 cm of the fifth metatarsal Electronic Signature(s) Signed: 12/13/2020 10:33:35 AM By: Linton Ham MD Previous Signature: 12/10/2020 4:29:11 PM Version By: Linton Ham MD Entered By: Linton Ham on 12/10/2020 16:34:21 -------------------------------------------------------------------------------- Physical Exam Details Patient Name: Date of Service: [name] [name] 12/10/2020 10:30 A M Medical Record Number: [id number] Patient Account Number: [id number] Date of Birth/Sex: Treating RN: 03/28/1946 (75 y.o. [name] [name] Primary Care Provider: Consuello Masse Other Clinician: Referring Provider: Treating Provider/Extender: Zadie Cleverly in Treatment: 10 Constitutional Sitting or standing Blood Pressure is within target range for patient.. Pulse regular and within target range for patient.Marland Kitchen Respirations regular, non-labored and within target range.. Temperature is normal and within the target range for the patient.Marland Kitchen Appears in no distress. Cardiovascular Dorsalis pedis pulses palpable which is an improvement since the revascularization. Notes Wound exam; still necrotic debris on the surface that I removed some with pickups and scissors. There is exposed bone here. However fortunately the entire area is less swollen and less erythematous. Certainly not as threatening looking as last week. Electronic Signature(s) Signed: 12/10/2020 4:29:11 PM By: Linton Ham MD Entered By: Linton Ham on 12/10/2020 12:32:30 -------------------------------------------------------------------------------- Physician Orders  Details Patient Name: Date of Service: [name] [name] 12/10/2020 10:30 A M Medical Record Number: [id number] Patient Account Number: [id number] Date of Birth/Sex: Treating RN: 1946/03/30 (74 y.o. [name] Primary Care Provider: Consuello Masse Other Clinician: Referring Provider: Treating Provider/Extender: Zadie Cleverly in Treatment: 10 Verbal / Phone Orders: No Diagnosis Coding ICD-10 Coding Code Description E11.621 Type 2 diabetes mellitus with foot ulcer E11.51 Type 2 diabetes mellitus with diabetic peripheral angiopathy without gangrene E11.42 Type 2 diabetes mellitus with diabetic polyneuropathy L03.116 Cellulitis of left lower limb L97.528 Non-pressure chronic ulcer of other part of left foot with other specified severity Follow-up Appointments ppointment in 1 week. - on Thursday or Friday with Dr. Dellia Nims Return A Bathing/ Shower/ Hygiene May shower and wash wound with soap and water. - on days changing dressing. Do not soak foot. Edema Control - Lymphedema / SCD / Other Elevate legs to the level of the heart or above for 30 minutes daily and/or when sitting, a frequency of: - throughout the day Avoid standing for long periods of time. Exercise regularly Additional Orders / Instructions Follow Nutritious Diet Other: - ***Go to apply with Dr. Baxter Flattery Infectious Disease on 12/11/2020 915am.**** ***Follow up with [name] [facility] on 12/17/2020 3pm.*** Wound Treatment Wound #1 - Metatarsal head fifth Wound Laterality: Plantar, Left Cleanser: Soap and Water 1 x Per Day/15 Days Discharge Instructions: May shower and wash wound with dial antibacterial soap and water prior to dressing change. Cleanser: Wound Cleanser (Generic) 1 x Per Day/15 Days Discharge Instructions: Cleanse the wound with wound cleanser prior to applying a clean dressing using gauze sponges, not tissue or cotton balls. Prim Dressing: KerraCel Ag Gelling Fiber Dressing, 4x5  in (silver alginate) (Generic) 1 x  Per Day/15 Days ary Discharge Instructions: Apply silver alginate to wound bed as instructed Secondary Dressing: Woven Gauze Sponges 2x2 in (Generic) 1 x Per Day/15 Days Discharge Instructions: Apply over primary dressing as directed. Secondary Dressing: Biatain Bordered Foam 3x3 1 x Per Day/15 Days Secured With: [employer], 4.5x3.1 (in/yd) (Generic) 1 x Per Day/15 Days Discharge Instructions: Secure with Kerlix as directed. Secured With: 26M Medipore H Soft Cloth Surgical T 4 x 2 (in/yd) (Generic) 1 x Per Day/15 Days ape Discharge Instructions: Secure dressing with tape as directed. Electronic Signature(s) Signed: 12/10/2020 1:59:21 PM By: Lorrin Jackson Signed: 12/10/2020 4:29:11 PM By: Linton Ham MD Entered By: Lorrin Jackson on 12/10/2020 12:52:57 -------------------------------------------------------------------------------- Problem List Details Patient Name: Date of Service: [name] [name] 12/10/2020 10:30 A M Medical Record Number: [id number] Patient Account Number: [id number] Date of Birth/Sex: Treating RN: [birth year] (74 y.o. [name] Primary Care Provider: Consuello Masse Other Clinician: Referring Provider: Treating Provider/Extender: Zadie Cleverly in Treatment: 10 Active Problems ICD-10 Encounter Code Description Active Date MDM Diagnosis E11.621 Type 2 diabetes mellitus with foot ulcer 09/27/2020 No Yes E11.51 Type 2 diabetes mellitus with diabetic peripheral angiopathy without gangrene 11/11/2020 No Yes L97.528 Non-pressure chronic ulcer of other part of left foot with other specified 09/27/2020 No Yes severity M86.272 Subacute osteomyelitis, left ankle and foot 12/10/2020 No Yes E11.42 Type 2 diabetes mellitus with diabetic polyneuropathy 09/27/2020 No Yes Inactive Problems ICD-10 Code Description Active Date Inactive Date L03.116 Cellulitis of left lower limb 10/04/2020 10/04/2020 Resolved  Problems Electronic Signature(s) Signed: 12/10/2020 4:29:11 PM By: Linton Ham MD Entered By: Linton Ham on 12/10/2020 12:27:22 -------------------------------------------------------------------------------- Progress Note Details Patient Name: Date of Service: [name] [name] 12/10/2020 10:30 A M Medical Record Number: [id number] Patient Account Number: [id number] Date of Birth/Sex: Treating RN: [birth year] (75 y.o. [name] [name] Primary Care Provider: Consuello Masse Other Clinician: Referring Provider: Treating Provider/Extender: Zadie Cleverly in Treatment: 10 Subjective History of Present Illness (HPI) ADMISSION 09/27/2020 This is a 75 year old man who lives in [place]. He apparently has had callus over the plantar fifth metatarsal head in the past for which she is followed by podiatry in [place]. They shaved down the callus and this was done in January. He states that he went to [place] in January and became aware of when he was there of an open wound in this area. He followed up with podiatry and has been soaking this twice a day with Epson salts and using Silvadene. Apparently one of the podiatrist told him he may need surgery because of bone protrusion. Past medical history includes an ischemic cardiomyopathy, type 2 diabetes with peripheral neuropathy, BPH, PVD, orthostatic hypotension, atrial fibrillation, hyperlipidemia and hypertension Arterial studies in 2018 showed a noncompressible ABI on the left. It was again noncompressible today at 1.7 although his pulses are easily palpable 4/8; patient I admitted to the clinic last week. Wound on the plantar left fifth metatarsal head which has been refractory. He also has a bit of subluxation of the bone in the head. He is a type II diabetic with peripheral neuropathy. We used silver collagen after debridement He arrives back in clinic today. He has erythema spreading into the lateral part of  the fifth metatarsal head. I removed some undermining tissue from around the wound and clearly there is a connection between the wound and the lateral part of the fifth met head. A culture was done. Area of erythema on the  lateral part of the foot was marked. His wife says that has been there since Wednesday 4/15; the patient arrived last week with erythema spreading in the lateral part of the fifth metatarsal head. There was a wound connected with the plantar fifth met head original wound. I gave him empiric Augmentin. Surprisingly the culture I did was negative. We have been using silver alginate. The wound has been open since January. He arrives in clinic today with the erythema much better 4/22; patient presents today for 1 week follow-up of his fifth metatarsal head wound. Daughter is present with the patient. She states that the wound has improved significantly since 2 weeks ago. There is no longer redness to the foot and the actual wound bed is smaller. Patient overall feels well and has no complaints today. 4/29; patient presents for 1 week follow-up of his fifth metatarsal head wound. Daughter is present. Patient has been using silver alginate every other day to the wound site. He has tried to relieve pressure with a surgical shoe. He denies signs of infections. 5/6; fifth metatarsal head wound. He has been using silver alginate changed to Santyl last week which he managed to obtain for $21 with a coupon we gave him. He also has an appointment with vein and vascular. I think Dr. Heber [place] referred him. He has noncompressible ABIs. The patient also had an x-ray and that was negative 5/13; the patient went to see [name]. Areas on the left fifth metatarsal head plantar and lateral. [name] noted that his ABI in the right was 0.99 but monophasic waveforms with a TBI at 0.32. He wanted to go ahead with an angiogram which is booked for Thursday. I talked to the patient about this and advised  him to go forward with this. 5/23; patient presents for 1 week follow-up. He has been using silver alginate to the wound bed. He has no complaints today. Denies signs of infection. He had an arteriogram done on 5/19. 5/31; angiogram showed patent common femoral, SFA, profunda, above-knee popliteal artery. 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 He does have inline flow down the left lower extremity through the peroneal that is widely patent. The peroneal gave off collaterals distally to the anterior tibial that feels retrograde at the ankle. An attempted recanalization of the left anterior tibial artery was made but was not successful. The plan as outlined by [name] is that he will continue getting aggressive wound care and he would consider a retrograde left anterior tibial intervention if there is no improvement. Wound itself does not look as though it is making any improvement. There is still tunneling superiorly precariously close to bone 6/3; back for the obligatory first total contact cast change. The wound clearly has exposed bone. He is a type II diabetic. He has been revascularized. Plain x- ray did not show osteomyelitis he may require an MRI. After his revascularization I elected to put him into a cast to make sure that all the pressure was coming off this area 6/7; patient back for his routine visit. The wound is not doing well. The patient had a lot of discomfort since he was last here 4 days ago. No systemic illness. He will not go back in the total contact cast today. 6/14; culture I did last time showed Enterobacter. At the time there was erythema and swelling I put him on Augmentin and Bactrim empirically. The Enterobacter was  sensitive to the Bactrim DS. He sees [name] tomorrow. I am looking for her recommendation about antibiotic choices and route of administration. He sees [name] of  vascular surgery on 6/21. I have communicated with [name] I would like to see whether or not further revascularization will be entertained and whether [name] feels that he could survive a foot conserving surgery if that is the route the patient wishes to go. After these next 2 consultations we may be able to discuss the route the patient wants to take either a surgical 1 or perhaps 1 that involves a prolonged course of antibiotics and hyperbaric oxygen. I touch bases with the patient and his daughter on all of this. Objective Constitutional Sitting or standing Blood Pressure is within target range for patient.. Pulse regular and within target range for patient.Marland Kitchen Respirations regular, non-labored and within target range.. Temperature is normal and within the target range for the patient.Marland Kitchen Appears in no distress. Vitals Time Taken: 11:04 AM, Height: 74 in, Weight: 238 lbs, BMI: 30.6, Temperature: 98.8 F, Pulse: 63 bpm, Respiratory Rate: 17 breaths/min, Blood Pressure: 109/54 mmHg, Capillary Blood Glucose: 149 mg/dl. Cardiovascular Dorsalis pedis pulses palpable which is an improvement since the revascularization. General Notes: Wound exam; still necrotic debris on the surface that I removed some with pickups and scissors. There is exposed bone here. However fortunately the entire area is less swollen and less erythematous. Certainly not as threatening looking as last week. Integumentary (Hair, Skin) Wound #1 status is Open. Original cause of wound was Gradually Appeared. The date acquired was: 06/29/2020. The wound has been in treatment 10 weeks. The wound is located on the Left,Plantar Metatarsal head fifth. The wound measures 1.3cm length x 1.7cm width x 0.3cm depth; 1.736cm^2 area and 0.521cm^3 volume. There is bone and Fat Layer (Subcutaneous Tissue) exposed. There is no tunneling or undermining noted. There is a medium amount of serosanguineous drainage noted. The wound margin is well  defined and not attached to the wound base. There is no granulation within the wound bed. There is a large (67-100%) amount of necrotic tissue within the wound bed including Adherent Slough. Assessment Active Problems ICD-10 Type 2 diabetes mellitus with foot ulcer Type 2 diabetes mellitus with diabetic peripheral angiopathy without gangrene Non-pressure chronic ulcer of other part of left foot with other specified severity Subacute osteomyelitis, left ankle and foot Type 2 diabetes mellitus with diabetic polyneuropathy Procedures Wound #1 Pre-procedure diagnosis of Wound #1 is a Diabetic Wound/Ulcer of the Lower Extremity located on the Left,Plantar Metatarsal head fifth .Severity of Tissue Pre Debridement is: Fat layer exposed. There was a Excisional Skin/Subcutaneous Tissue Debridement with a total area of 2.21 sq cm performed by Ricard Dillon., MD. With the following instrument(s): Forceps, and Scissors to remove Viable and Non-Viable tissue/material. Material removed includes Subcutaneous Tissue, Slough, Skin: Dermis, Skin: Epidermis, and Fibrin/Exudate after achieving pain control using Lidocaine 4% T opical Solution. A time out was conducted at 11:25, prior to the start of the procedure. A Minimum amount of bleeding was controlled with Pressure. The procedure was tolerated well with a pain level of 0 throughout and a pain level of 0 following the procedure. Post Debridement Measurements: 1.3cm length x 1.7cm width x 0.3cm depth; 0.521cm^3 volume. Character of Wound/Ulcer Post Debridement requires further debridement. Severity of Tissue Post Debridement is: Fat layer exposed. Post procedure Diagnosis Wound #1: Same as Pre-Procedure Plan Follow-up Appointments: Return Appointment in 1 week. - on Thursday or Friday with  Dr. Dellia Nims Bathing/ Shower/ Hygiene: May shower and wash wound with soap and water. - on days changing dressing. Do not soak foot. Edema Control - Lymphedema / SCD /  Other: Elevate legs to the level of the heart or above for 30 minutes daily and/or when sitting, a frequency of: - throughout the day Avoid standing for long periods of time. Exercise regularly Additional Orders / Instructions: Follow Nutritious Diet Other: - ***Go to apply with Dr. Baxter Flattery Infectious Disease on 12/11/2020 915am.**** ***Follow up with [name] [facility] on 12/17/2020 3pm.*** WOUND #1: - Metatarsal head fifth Wound Laterality: Plantar, Left Cleanser: Soap and Water 1 x Per Day/15 Days Discharge Instructions: May shower and wash wound with dial antibacterial soap and water prior to dressing change. Cleanser: Wound Cleanser (Generic) 1 x Per Day/15 Days Discharge Instructions: Cleanse the wound with wound cleanser prior to applying a clean dressing using gauze sponges, not tissue or cotton balls. Prim Dressing: KerraCel Ag Gelling Fiber Dressing, 4x5 in (silver alginate) (Generic) 1 x Per Day/15 Days ary Discharge Instructions: Apply silver alginate to wound bed as instructed Secondary Dressing: Woven Gauze Sponges 2x2 in (Generic) 1 x Per Day/15 Days Discharge Instructions: Apply over primary dressing as directed. Secondary Dressing: Zetuvit Plus Silicone Border Dressing 4x4 (in/in) (Generic) 1 x Per Day/15 Days Discharge Instructions: Apply silicone border over primary dressing as directed. Secured With: [employer], 4.5x3.1 (in/yd) (Generic) 1 x Per Day/15 Days Discharge Instructions: Secure with Kerlix as directed. Secured With: 63M Medipore H Soft Cloth Surgical T 4 x 2 (in/yd) (Generic) 1 x Per Day/15 Days ape Discharge Instructions: Secure dressing with tape as directed. 1. I continued with the silver alginate 2. Sees [name] of infectious disease tomorrow 3. Follows up with [name] on 6/21.o Additional revascularization and his best guess about whether this could be fixed surgically with removal of the diseased bone which is the distal part of the  fifth metatarsal and whether we could expect a decent chance of healing 4. There is a consideration of hyperbaric oxygen here depending on a lot of things. I briefly mentioned this to him and his daughter today Engineer, maintenance) Signed: 12/10/2020 4:29:11 PM By: Linton Ham MD Entered By: Linton Ham on 12/10/2020 12:39:43 -------------------------------------------------------------------------------- SuperBill Details Patient Name: Date of Service: [name] [name] 12/10/2020 Medical Record Number: [id number] Patient Account Number: [id number] Date of Birth/Sex: Treating RN: [birth year] (74 y.o. [name], Tammi Klippel Primary Care Provider: Consuello Masse Other Clinician: Referring Provider: Treating Provider/Extender: Zadie Cleverly in Treatment: 10 Diagnosis Coding ICD-10 Codes Code Description [phone] Type 2 diabetes mellitus with foot ulcer E11.51 Type 2 diabetes mellitus with diabetic peripheral angiopathy without gangrene E11.42 Type 2 diabetes mellitus with diabetic polyneuropathy L03.116 Cellulitis of left lower limb L97.528 Non-pressure chronic ulcer of other part of left foot with other specified severity Facility Procedures CPT4 Code: [id number] Description: 34742 - DEB SUBQ TISSUE 20 SQ CM/< ICD-10 Diagnosis Description L97.528 Non-pressure chronic ulcer of other part of left foot with other specified seve Modifier: rity Quantity: 1 Physician Procedures : CPT4 Code Description Modifier [id number] 11042 - WC PHYS SUBQ TISS 20 SQ CM ICD-10 Diagnosis Description L97.528 Non-pressure chronic ulcer of other part of left foot with other specified severity Quantity: 1 Electronic Signature(s) Signed: 12/10/2020 4:29:11 PM By: Linton Ham MD Entered By: Linton Ham on 12/10/2020 12:39:54

## 2020-12-17 ENCOUNTER — Ambulatory Visit (HOSPITAL_COMMUNITY)
Admission: RE | Admit: 2020-12-17 | Discharge: 2020-12-17 | Disposition: A | Discharge status: Home / Self Care | Payer: Medicare Other | Source: Ambulatory Visit | Attending: Internal Medicine | Admitting: Internal Medicine

## 2020-12-17 ENCOUNTER — Encounter (HOSPITAL_COMMUNITY)
Admission: RE | Admit: 2020-12-17 | Discharge: 2020-12-17 | Disposition: A | Discharge status: Home / Self Care | Payer: Medicare Other | Source: Ambulatory Visit | Attending: Internal Medicine | Admitting: Internal Medicine

## 2020-12-17 ENCOUNTER — Ambulatory Visit (INDEPENDENT_AMBULATORY_CARE_PROVIDER_SITE_OTHER): Payer: Medicare Other | Admitting: Vascular Surgery

## 2020-12-17 ENCOUNTER — Other Ambulatory Visit: Payer: Self-pay

## 2020-12-17 ENCOUNTER — Other Ambulatory Visit: Payer: Self-pay | Admitting: Internal Medicine

## 2020-12-17 ENCOUNTER — Encounter: Payer: Self-pay | Admitting: Vascular Surgery

## 2020-12-17 VITALS — BP 119/61 | HR 66 | Temp 97.6°F | Resp 18 | Ht 74.0 in | Wt 227.0 lb

## 2020-12-17 DIAGNOSIS — M869 Osteomyelitis, unspecified: Secondary | ICD-10-CM | POA: Diagnosis not present

## 2020-12-17 DIAGNOSIS — Z89422 Acquired absence of other left toe(s): Secondary | ICD-10-CM | POA: Insufficient documentation

## 2020-12-17 DIAGNOSIS — E11621 Type 2 diabetes mellitus with foot ulcer: Secondary | ICD-10-CM

## 2020-12-17 DIAGNOSIS — Z452 Encounter for adjustment and management of vascular access device: Secondary | ICD-10-CM | POA: Diagnosis not present

## 2020-12-17 DIAGNOSIS — I739 Peripheral vascular disease, unspecified: Secondary | ICD-10-CM

## 2020-12-17 MED ORDER — HEPARIN SOD (PORK) LOCK FLUSH 100 UNIT/ML IV SOLN: 250.0000 [IU] | INTRAVENOUS | Status: DC | PRN

## 2020-12-17 MED ORDER — SODIUM CHLORIDE 0.9 % IV SOLN
1.0000 g | Freq: Once | INTRAVENOUS | Status: AC
  Administered 2020-12-17: 1000 mg via INTRAVENOUS
  Filled 2020-12-17: qty 1

## 2020-12-17 MED ORDER — LIDOCAINE HCL 1 % IJ SOLN
INTRAMUSCULAR | Status: AC
  Filled 2020-12-17: qty 20

## 2020-12-17 MED ORDER — HEPARIN SOD (PORK) LOCK FLUSH 100 UNIT/ML IV SOLN
INTRAVENOUS | Status: AC
  Administered 2020-12-17: 250 [IU]
  Filled 2020-12-17: qty 5

## 2020-12-17 MED ORDER — HEPARIN SOD (PORK) LOCK FLUSH 100 UNIT/ML IV SOLN: 250.0000 [IU] | Freq: Every day | INTRAVENOUS | Status: DC

## 2020-12-17 NOTE — Procedures (Signed)
PROCEDURE SUMMARY:  Successful placement of single lumen PICC line to right basilic vein. Length 41 cm Tip at lower SVC/RA PICC capped No complications Ready for use  EBL < 5 mL   [name] [name] H [name] Harold PA-C 12/17/2020, 10:27 AM

## 2020-12-17 NOTE — Progress Notes (Signed)
Patient name: [name] [name] MRN: [id number] DOB: Jun 11, 1946 Sex: male  REASON FOR CONSULT: 1 month follow-up left foot wound  HPI: [name] [name] is a 75 y.o. male, multiple medical problems including diabetes, atrial fibrillation, coronary artery disease status post CABG, heart failure that presents for 1 month follow-up of nonhealing left foot wound.  Patient states the wound started in January of this year.  Ultimately he started going to the wound clinic at the beginning of April and sees Dr. Dellia Nims.    He underwent angiogram with me on 11/14/2020 and was found to have severe tibial disease with only peroneal runoff.  I did attempt antegrade recanalization of the AT.  Discussed if wound gets worse would consider attempted retrograde left anterior tibial intervention in the future if his wound fails to make progress.  He feels the wounds is about the same.  He has gotten a PICC and started IV antibiotics.  Past Medical History:  Diagnosis Date   Arthritis    Knee L - probably   Atrial fibrillation (HCC)    BPH (benign prostatic hyperplasia)    CAD (coronary artery disease) 07/06/2011   CHF (congestive heart failure) (HCC)    Coronary artery disease    Diabetes mellitus    Frequency of urination    Heart failure (HCC)    Heart murmur    High cholesterol    History of nuclear stress test 09/2010   dipyridamole; mild perfusion defect due to attenuation with mild superimposed ischemia in apical septal, apical, apical inferior & apical lateral regions; rest LV enlarged in size; prominent gut uptake in infero-apical region; no significant ischemia demonstrated; low risk scan    HNP (herniated nucleus pulposus), lumbar    Ischemic cardiomyopathy    LV dysfunction 07/06/2011   Nocturia    Right bundle branch block    S/P CABG (coronary artery bypass graft) 08/03/2011   x3; LIMA to LAD,, SVG to PDA, SVG to posterolateral branch of RCA; Dr. Ellison Hughs   Sleep apnea    sleep study 10/2010- [facility]  during total sleep 32.1/hr and during REM 62.3/hr (severe sleep apnea)unable to tolerate c pap   Spinal stenosis of lumbar region    Stroke [facility])    [place]     Past Surgical History:  Procedure Laterality Date   ABDOMINAL AORTOGRAM W/LOWER EXTREMITY Left 11/14/2020   Procedure: ABDOMINAL AORTOGRAM W/LOWER EXTREMITY;  Surgeon: [name], MD;  Location: [place] [facility];  Service: Cardiovascular;  Laterality: Left;   CARDIAC CATHETERIZATION  01/2011   ischemic cardiomyopathy 30-35%, multivessel CAD (Dr. [name])    CARDIAC CATHETERIZATION  09/13/2015   Procedure: Right/Left Heart Cath and Coronary/Graft Angiography;  Surgeon: [name], MD;  Location: [place] [facility];  Service: Cardiovascular;;   CARDIOVERSION N/A 05/16/2019   Procedure: CARDIOVERSION;  Surgeon: [name], MD;  Location: [place];  Service: Cardiovascular;  Laterality: N/A;   Colonscopy     CORONARY ARTERY BYPASS GRAFT  08/03/2011   Procedure: CORONARY ARTERY BYPASS GRAFTING (CABG);  Surgeon: [name], MD;  Location: [place];  Service: Open Heart Surgery;  Laterality: N/A;  CABG times three using right saphenous vein and left mammary artery usisng endoscope.   LEFT HEART CATHETERIZATION WITH CORONARY/GRAFT ANGIOGRAM N/A 09/20/2013   Procedure: LEFT HEART CATHETERIZATION WITH [name];  Surgeon: [name], MD;  Location: [facility];  Service: Cardiovascular;  Laterality: N/A;   Lower Extremity Arterial Doppler  03/16/2013   bilat ABIs demonstated normal values; R runoff - posterior tibial & anterior tibial arteries occluded; L runoff - peroneal & posterior tibial arteries occluded, anterior tibial artery appears occluded   LUMBAR LAMINECTOMY/DECOMPRESSION MICRODISCECTOMY N/A 09/12/2014   Procedure: LUMBAR DECOMPRESSION L3-L4, L4-L5, MICRODISCECTOMY L4-L5;  Surgeon: [name], MD;  Location: [facility];  Service: Orthopedics;  Laterality: N/A;   PERIPHERAL VASCULAR  BALLOON ANGIOPLASTY  11/14/2020   Procedure: PERIPHERAL VASCULAR BALLOON ANGIOPLASTY;  Surgeon: [name], MD;  Location: [place] [facility];  Service: Cardiovascular;;   Pilonidal Cyst removed     TRANSTHORACIC ECHOCARDIOGRAM  11/10/2012   EF 40-45%, mild LVH, mild hypokinesis of anteroseptal myocardium, grade 1 diastolic dysfunction; mild MR & calcifed mitral annulus; LA mild-mod dilated; RA mildly dilated    Family History  Problem Relation Age of Onset   Anesthesia problems Daughter    Cancer Mother    Lung disease Father        & heart disease   Colon cancer Sister    Sudden death Maternal Grandmother     SOCIAL HISTORY: Social History   Socioeconomic History   Marital status: Widowed    Spouse name: Not on file   Number of children: 1   Years of education: Not on file   Highest education level: Not on file  Occupational History   Occupation: real Investment banker, corporate  Tobacco Use   Smoking status: Former    Pack years: 0.00    Types: Cigars    Quit date: 09/07/2011    Years since quitting: 9.2   Smokeless tobacco: Former    Quit date: 02/28/2012  Substance and Sexual Activity   Alcohol use: Yes    Alcohol/week: 0.0 standard drinks    Comment: occasional   Drug use: No   Sexual activity: Not on file  Other Topics Concern   Not on file  Social History Narrative   Not on file   Social Determinants of Health   Financial Resource Strain: Not on file  Food Insecurity: Not on file  Transportation Needs: Not on file  Physical Activity: Not on file  Stress: Not on file  Social Connections: Not on file  Intimate Partner Violence: Not on file    Allergies  Allergen Reactions   Entresto [Sacubitril-Valsartan] Other (See Comments)    dizziness   Sacubitril Other (See Comments)     Dizziness    Current Outpatient Medications  Medication Sig Dispense Refill   aspirin EC 81 MG tablet Take 81 mg by mouth at bedtime.      carvedilol (COREG) 6.25 MG tablet  Take 1 tablet (6.25 mg total) by mouth 2 (two) times daily. 180 tablet 3   ELIQUIS 5 MG TABS tablet Take 1 tablet by mouth twice daily (Patient taking differently: Take 5 mg by mouth in the morning and at bedtime.) 180 tablet 1   empagliflozin (JARDIANCE) 10 MG TABS tablet Take 1 tablet (10 mg total) by mouth daily before breakfast. 30 tablet 11   furosemide (LASIX) 40 MG tablet Take 40 mg by mouth in the morning.     gabapentin (NEURONTIN) 300 MG capsule Take 600 mg by mouth at bedtime.     glipiZIDE (GLUCOTROL XL) 10 MG 24 hr tablet Take 10 mg by mouth at bedtime.     metFORMIN (GLUCOPHAGE-XR) 500 MG 24 hr tablet Take 1,000 mg by mouth 2 (two) times daily.     ONETOUCH VERIO test strip 1 each daily.  rosuvastatin (CRESTOR) 20 MG tablet Take 10 mg by mouth every other day. In the evening     sulfamethoxazole-trimethoprim (BACTRIM DS) 800-160 MG tablet Take 1 tablet by mouth daily. 30 tablet 0   alfuzosin (UROXATRAL) 10 MG 24 hr tablet Take 1 tablet (10 mg total) by mouth daily with breakfast. (Patient not taking: Reported on 12/17/2020) 30 tablet 11   losartan (COZAAR) 25 MG tablet Take 1 tablet (25 mg total) by mouth daily. (Patient not taking: Reported on 11/11/2020) 90 tablet 3   tadalafil (CIALIS) 20 MG tablet Take 1 tablet (20 mg total) by mouth as needed. (Patient not taking: Reported on 11/11/2020) 20 tablet 5   No current facility-administered medications for this visit.   Facility-Administered Medications Ordered in Other Visits  Medication Dose Route Frequency Provider Last Rate Last Admin   heparin lock flush 100 unit/mL  250 Units Intracatheter Daily Carlyle Basques, MD       And   heparin lock flush 100 unit/mL  250 Units Intracatheter PRN Carlyle Basques, MD   250 Units at 12/17/20 1111   lidocaine (XYLOCAINE) 1 % (with pres) injection             REVIEW OF SYSTEMS:  [X]  denotes positive finding, [ ]  denotes negative finding Cardiac  Comments:  Chest pain or chest pressure:     Shortness of breath upon exertion:    Short of breath when lying flat:    Irregular heart rhythm:        Vascular    Pain in calf, thigh, or hip brought on by ambulation:    Pain in feet at night that wakes you up from your sleep:     Blood clot in your veins:    Leg swelling:         Pulmonary    Oxygen at home:    Productive cough:     Wheezing:         Neurologic    Sudden weakness in arms or legs:     Sudden numbness in arms or legs:     Sudden onset of difficulty speaking or slurred speech:    Temporary loss of vision in one eye:     Problems with dizziness:         Gastrointestinal    Blood in stool:     Vomited blood:         Genitourinary    Burning when urinating:     Blood in urine:        Psychiatric    Major depression:         Hematologic    Bleeding problems:    Problems with blood clotting too easily:        Skin    Rashes or ulcers:        Constitutional    Fever or chills:      PHYSICAL EXAM: Vitals:   12/17/20 1555  BP: 119/61  Pulse: 66  Resp: 18  Temp: 97.6 F (36.4 C)  TempSrc: Temporal  SpO2: 98%  Weight: 227 lb (103 kg)  Height: 6\' 2"  (1.88 m)    GENERAL: The patient is a well-nourished male, in no acute distress. The vital signs are documented above. CARDIAC: There is a regular rate and rhythm.  VASCULAR:  Palpable femoral pulses bilateral Palpable popliteal pulses bilaterally No palpable pedal pulses Left 5th metatarsal diabetic ulcer PULMONARY: No respiratory distress. NEUROLOGIC: No focal weakness or paresthesias are detected. PSYCHIATRIC: The patient  has a normal affect.       DATA:   ABIs on the left 0.99 but monophasic and severely reduced toe pressure 32.  Assessment/Plan:  75 year old male with multiple medical comorbidities as noted above that presents for one month follow-up of a nonhealing left foot.  He has since had a PICC line placed and started antibiotics through the wound clinic and also has  been talking about hyperbaric oxygen.  His daughter asked about the ability to heal toe amputation/debridement and I discussed my concern given his toe pressure of 32 I think he would be high risk for non-healing.  We discussed the other option would be a retrograde anterior tibial intervention as I previously discussed with the family.  He does have inline flow down the peroneal but if the wound continues to get worse this would be the only other option.  They had a number of questions today and I tried to answer all of these.  Ultimately I think we agreed that he will continue to see how the wound improves with IV antibiotics and continue wound care and potential hyperbaric oxygen over the next month.  I will plan to see him in 1 month.  He may reconsider retrograde intervention at anytime if the wound starts to look worse.   [name], MD [facility] Office: [phone]

## 2020-12-18 DIAGNOSIS — E7849 Other hyperlipidemia: Secondary | ICD-10-CM | POA: Diagnosis not present

## 2020-12-18 DIAGNOSIS — I5033 Acute on chronic diastolic (congestive) heart failure: Secondary | ICD-10-CM | POA: Diagnosis not present

## 2020-12-18 DIAGNOSIS — N400 Enlarged prostate without lower urinary tract symptoms: Secondary | ICD-10-CM | POA: Diagnosis not present

## 2020-12-18 DIAGNOSIS — Z7901 Long term (current) use of anticoagulants: Secondary | ICD-10-CM | POA: Diagnosis not present

## 2020-12-18 DIAGNOSIS — I2510 Atherosclerotic heart disease of native coronary artery without angina pectoris: Secondary | ICD-10-CM | POA: Diagnosis not present

## 2020-12-18 DIAGNOSIS — E1140 Type 2 diabetes mellitus with diabetic neuropathy, unspecified: Secondary | ICD-10-CM | POA: Diagnosis not present

## 2020-12-18 DIAGNOSIS — M869 Osteomyelitis, unspecified: Secondary | ICD-10-CM | POA: Diagnosis not present

## 2020-12-18 DIAGNOSIS — I110 Hypertensive heart disease with heart failure: Secondary | ICD-10-CM | POA: Diagnosis not present

## 2020-12-18 DIAGNOSIS — Z5181 Encounter for therapeutic drug level monitoring: Secondary | ICD-10-CM | POA: Diagnosis not present

## 2020-12-18 DIAGNOSIS — L97528 Non-pressure chronic ulcer of other part of left foot with other specified severity: Secondary | ICD-10-CM | POA: Diagnosis not present

## 2020-12-18 DIAGNOSIS — L03116 Cellulitis of left lower limb: Secondary | ICD-10-CM | POA: Diagnosis not present

## 2020-12-18 DIAGNOSIS — E669 Obesity, unspecified: Secondary | ICD-10-CM | POA: Diagnosis not present

## 2020-12-18 DIAGNOSIS — E785 Hyperlipidemia, unspecified: Secondary | ICD-10-CM | POA: Diagnosis not present

## 2020-12-18 DIAGNOSIS — I255 Ischemic cardiomyopathy: Secondary | ICD-10-CM | POA: Diagnosis not present

## 2020-12-18 DIAGNOSIS — Z452 Encounter for adjustment and management of vascular access device: Secondary | ICD-10-CM | POA: Diagnosis not present

## 2020-12-18 DIAGNOSIS — E1151 Type 2 diabetes mellitus with diabetic peripheral angiopathy without gangrene: Secondary | ICD-10-CM | POA: Diagnosis not present

## 2020-12-18 DIAGNOSIS — Z7984 Long term (current) use of oral hypoglycemic drugs: Secondary | ICD-10-CM | POA: Diagnosis not present

## 2020-12-18 DIAGNOSIS — I5022 Chronic systolic (congestive) heart failure: Secondary | ICD-10-CM | POA: Diagnosis not present

## 2020-12-18 DIAGNOSIS — E1169 Type 2 diabetes mellitus with other specified complication: Secondary | ICD-10-CM | POA: Diagnosis not present

## 2020-12-18 DIAGNOSIS — I4891 Unspecified atrial fibrillation: Secondary | ICD-10-CM | POA: Diagnosis not present

## 2020-12-18 DIAGNOSIS — Z7982 Long term (current) use of aspirin: Secondary | ICD-10-CM | POA: Diagnosis not present

## 2020-12-18 DIAGNOSIS — E1165 Type 2 diabetes mellitus with hyperglycemia: Secondary | ICD-10-CM | POA: Diagnosis not present

## 2020-12-18 DIAGNOSIS — I10 Essential (primary) hypertension: Secondary | ICD-10-CM | POA: Diagnosis not present

## 2020-12-18 DIAGNOSIS — E1142 Type 2 diabetes mellitus with diabetic polyneuropathy: Secondary | ICD-10-CM | POA: Diagnosis not present

## 2020-12-18 DIAGNOSIS — E11621 Type 2 diabetes mellitus with foot ulcer: Secondary | ICD-10-CM | POA: Diagnosis not present

## 2020-12-18 DIAGNOSIS — N401 Enlarged prostate with lower urinary tract symptoms: Secondary | ICD-10-CM | POA: Diagnosis not present

## 2020-12-18 DIAGNOSIS — Z6830 Body mass index (BMI) 30.0-30.9, adult: Secondary | ICD-10-CM | POA: Diagnosis not present

## 2020-12-18 DIAGNOSIS — M48062 Spinal stenosis, lumbar region with neurogenic claudication: Secondary | ICD-10-CM | POA: Diagnosis not present

## 2020-12-18 DIAGNOSIS — I951 Orthostatic hypotension: Secondary | ICD-10-CM | POA: Diagnosis not present

## 2020-12-18 DIAGNOSIS — Z792 Long term (current) use of antibiotics: Secondary | ICD-10-CM | POA: Diagnosis not present

## 2020-12-20 ENCOUNTER — Other Ambulatory Visit: Payer: Self-pay

## 2020-12-20 ENCOUNTER — Other Ambulatory Visit (HOSPITAL_COMMUNITY)
Admission: RE | Admit: 2020-12-20 | Discharge: 2020-12-20 | Disposition: A | Discharge status: Home / Self Care | Payer: Medicare Other | Attending: Internal Medicine | Admitting: Internal Medicine

## 2020-12-20 ENCOUNTER — Encounter (HOSPITAL_BASED_OUTPATIENT_CLINIC_OR_DEPARTMENT_OTHER): Payer: Medicare Other | Admitting: Internal Medicine

## 2020-12-20 DIAGNOSIS — E1151 Type 2 diabetes mellitus with diabetic peripheral angiopathy without gangrene: Secondary | ICD-10-CM | POA: Diagnosis not present

## 2020-12-20 DIAGNOSIS — E1142 Type 2 diabetes mellitus with diabetic polyneuropathy: Secondary | ICD-10-CM | POA: Diagnosis not present

## 2020-12-20 DIAGNOSIS — L97516 Non-pressure chronic ulcer of other part of right foot with bone involvement without evidence of necrosis: Secondary | ICD-10-CM | POA: Diagnosis not present

## 2020-12-20 DIAGNOSIS — L97529 Non-pressure chronic ulcer of other part of left foot with unspecified severity: Secondary | ICD-10-CM | POA: Diagnosis not present

## 2020-12-20 DIAGNOSIS — E11621 Type 2 diabetes mellitus with foot ulcer: Secondary | ICD-10-CM | POA: Diagnosis not present

## 2020-12-22 DIAGNOSIS — L97529 Non-pressure chronic ulcer of other part of left foot with unspecified severity: Secondary | ICD-10-CM | POA: Diagnosis not present

## 2020-12-22 DIAGNOSIS — E1142 Type 2 diabetes mellitus with diabetic polyneuropathy: Secondary | ICD-10-CM | POA: Diagnosis not present

## 2020-12-22 DIAGNOSIS — E1151 Type 2 diabetes mellitus with diabetic peripheral angiopathy without gangrene: Secondary | ICD-10-CM | POA: Diagnosis not present

## 2020-12-22 DIAGNOSIS — E11621 Type 2 diabetes mellitus with foot ulcer: Secondary | ICD-10-CM | POA: Diagnosis not present

## 2020-12-25 DIAGNOSIS — M869 Osteomyelitis, unspecified: Secondary | ICD-10-CM | POA: Diagnosis not present

## 2020-12-25 DIAGNOSIS — M86679 Other chronic osteomyelitis, unspecified ankle and foot: Secondary | ICD-10-CM | POA: Diagnosis not present

## 2020-12-25 DIAGNOSIS — E11621 Type 2 diabetes mellitus with foot ulcer: Secondary | ICD-10-CM | POA: Diagnosis not present

## 2020-12-25 DIAGNOSIS — E1142 Type 2 diabetes mellitus with diabetic polyneuropathy: Secondary | ICD-10-CM | POA: Diagnosis not present

## 2020-12-25 DIAGNOSIS — E1169 Type 2 diabetes mellitus with other specified complication: Secondary | ICD-10-CM | POA: Diagnosis not present

## 2020-12-25 DIAGNOSIS — L03116 Cellulitis of left lower limb: Secondary | ICD-10-CM | POA: Diagnosis not present

## 2020-12-25 DIAGNOSIS — L97528 Non-pressure chronic ulcer of other part of left foot with other specified severity: Secondary | ICD-10-CM | POA: Diagnosis not present

## 2020-12-25 LAB — CARBAPENEM RESISTANCE PANEL
Carba Resistance IMP Gene: NOT DETECTED
Carba Resistance KPC Gene: NOT DETECTED
Carba Resistance NDM Gene: NOT DETECTED
Carba Resistance OXA48 Gene: NOT DETECTED
Carba Resistance VIM Gene: NOT DETECTED

## 2020-12-26 ENCOUNTER — Encounter (HOSPITAL_BASED_OUTPATIENT_CLINIC_OR_DEPARTMENT_OTHER): Payer: Medicare Other | Admitting: Internal Medicine

## 2020-12-26 ENCOUNTER — Other Ambulatory Visit: Payer: Self-pay

## 2020-12-26 DIAGNOSIS — B957 Other staphylococcus as the cause of diseases classified elsewhere: Secondary | ICD-10-CM | POA: Diagnosis not present

## 2020-12-26 DIAGNOSIS — E11621 Type 2 diabetes mellitus with foot ulcer: Secondary | ICD-10-CM | POA: Diagnosis not present

## 2020-12-26 DIAGNOSIS — E1151 Type 2 diabetes mellitus with diabetic peripheral angiopathy without gangrene: Secondary | ICD-10-CM | POA: Diagnosis not present

## 2020-12-26 DIAGNOSIS — L97526 Non-pressure chronic ulcer of other part of left foot with bone involvement without evidence of necrosis: Secondary | ICD-10-CM | POA: Diagnosis not present

## 2020-12-26 DIAGNOSIS — L97529 Non-pressure chronic ulcer of other part of left foot with unspecified severity: Secondary | ICD-10-CM | POA: Diagnosis not present

## 2020-12-26 DIAGNOSIS — E1142 Type 2 diabetes mellitus with diabetic polyneuropathy: Secondary | ICD-10-CM | POA: Diagnosis not present

## 2020-12-26 LAB — AEROBIC/ANAEROBIC CULTURE W GRAM STAIN (SURGICAL/DEEP WOUND): Gram Stain: NONE SEEN

## 2020-12-26 NOTE — Progress Notes (Signed)
QUARTEZ, LAGOS ([id number]) Visit Report for 12/26/2020 HPI Details Patient Name: Date of Service: [name] [name], [name] [name] 12/26/2020 8:15 A M Medical Record Number: [id number] Patient Account Number: [id number] Date of Birth/Sex: Treating RN: [birth year] (75 y.o. [name] Primary Care Provider: Consuello Masse Other Clinician: Referring Provider: Treating Provider/Extender: Zadie Cleverly in Treatment: 12 History of Present Illness HPI Description: ADMISSION 09/27/2020 This is a 75 year old man who lives in [place]. He apparently has had callus over the plantar fifth metatarsal head in the past for which she is followed by podiatry in [place]. They shaved down the callus and this was done in January. He states that he went to [place] in January and became aware of when he was there of an open wound in this area. He followed up with podiatry and has been soaking this twice a day with Epson salts and using Silvadene. Apparently one of the podiatrist told him he may need surgery because of bone protrusion. Past medical history includes an ischemic cardiomyopathy, type 2 diabetes with peripheral neuropathy, BPH, PVD, orthostatic hypotension, atrial fibrillation, hyperlipidemia and hypertension Arterial studies in 2018 showed a noncompressible ABI on the left. It was again noncompressible today at 1.7 although his pulses are easily palpable 4/8; patient I admitted to the clinic last week. Wound on the plantar left fifth metatarsal head which has been refractory. He also has a bit of subluxation of the bone in the head. He is a type II diabetic with peripheral neuropathy. We used silver collagen after debridement He arrives back in clinic today. He has erythema spreading into the lateral part of the fifth metatarsal head. I removed some undermining tissue from around the wound and clearly there is a connection between the wound and the lateral part of the fifth met head. A  culture was done. Area of erythema on the lateral part of the foot was marked. His wife says that has been there since Wednesday 4/15; the patient arrived last week with erythema spreading in the lateral part of the fifth metatarsal head. There was a wound connected with the plantar fifth met head original wound. I gave him empiric Augmentin. Surprisingly the culture I did was negative. We have been using silver alginate. The wound has been open since January. He arrives in clinic today with the erythema much better 4/22; patient presents today for 1 week follow-up of his fifth metatarsal head wound. Daughter is present with the patient. She states that the wound has improved significantly since 2 weeks ago. There is no longer redness to the foot and the actual wound bed is smaller. Patient overall feels well and has no complaints today. 4/29; patient presents for 1 week follow-up of his fifth metatarsal head wound. Daughter is present. Patient has been using silver alginate every other day to the wound site. He has tried to relieve pressure with a surgical shoe. He denies signs of infections. 5/6; fifth metatarsal head wound. He has been using silver alginate changed to Santyl last week which he managed to obtain for $21 with a coupon we gave him. He also has an appointment with vein and vascular. I think Dr. Heber [place] referred him. He has noncompressible ABIs. The patient also had an x-ray and that was negative 5/13; the patient went to see [name]. Areas on the left fifth metatarsal head plantar and lateral. [name] noted that his ABI in the right was 0.99 but monophasic waveforms with a TBI at 0.32.  He wanted to go ahead with an angiogram which is booked for Thursday. I talked to the patient about this and advised him to go forward with this. 5/23; patient presents for 1 week follow-up. He has been using silver alginate to the wound bed. He has no complaints today. Denies signs of infection. He  had an arteriogram done on 5/19. 5/31; angiogram showed patent common femoral, SFA, profunda, above-knee popliteal artery. 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 He does have inline flow down the left lower extremity through the peroneal that is widely patent. The peroneal gave off collaterals distally to the anterior tibial that feels retrograde at the ankle. An attempted recanalization of the left anterior tibial artery was made but was not successful. The plan as outlined by [name] is that he will continue getting aggressive wound care and he would consider a retrograde left anterior tibial intervention if there is no improvement. Wound itself does not look as though it is making any improvement. There is still tunneling superiorly precariously close to bone 6/3; back for the obligatory first total contact cast change. The wound clearly has exposed bone. He is a type II diabetic. He has been revascularized. Plain x- ray did not show osteomyelitis he may require an MRI. After his revascularization I elected to put him into a cast to make sure that all the pressure was coming off this area 6/7; patient back for his routine visit. The wound is not doing well. The patient had a lot of discomfort since he was last here 4 days ago. No systemic illness. He will not go back in the total contact cast today. 6/14; culture I did last time showed Enterobacter. At the time there was erythema and swelling I put him on Augmentin and Bactrim empirically. The Enterobacter was sensitive to the Bactrim DS. He sees [name] tomorrow. I am looking for her recommendation about antibiotic choices and route of administration. He sees [name] of vascular surgery on 6/21. I have communicated with [name] I would like to see whether or not further revascularization will be entertained and whether [name] feels that he could  survive a foot conserving surgery if that is the route the patient wishes to go. After these next 2 consultations we may be able to discuss the route the patient wants to take either a surgical 1 or perhaps 1 that involves a prolonged course of antibiotics and hyperbaric oxygen. I touch bases with the patient and his daughter on all of this. MRI suggested osteomyelitis in the distal 2 cm of the fifth metatarsal 6/24; the patient was kindly seen by [name] of infectious disease by telehealth visit. He has established a PICC line and is on a Carbapenem for 4 weeks although I am not really sure which 1. Initial culture was Enterobacter I believe this was a swab. He is also followed up with [name]. He again is considering an anterograde anterior tibial attempted revascularization. The patient is to call if they agree to move forward with the anterograde attempt. He started an IV antibiotics already. He arrives in clinic today with again the wound deteriorating. There is exposed bone widely which is quite a bit worse than last week. I did obtain a piece of this for culture but more bone debridement is likely going to need to be done. I had an extensive discussion with the patient and his daughter.  The problem is the ray amputation success depends largely on blood flow. [name] did not sound optimistic about this. However I would definitely go ahead with the retrograde attempt by [name] even though there are potential risks. I explained this to the patient. If we are going to have any attempt to salvage this man's foot we are going to need blood flow and I would go ahead with this. IV antibiotics in the meantime and consideration of hyperbaric oxygen all of these things I discussed. The option would be a below-knee amputation which the patient does not seem ready for his daughter is certainly not ready for. But we also talked about this 6/30; bone culture I did last week showed the same  Enterobacter cloacae I as previous. Also rare Staph epidermidis. I am not really sure of the significance of this which may be a contamination. He has a anterior grade access attempt by [name] of vein and vascular on 7/7. After we see the result of this I will go over his options which are continued antibiotics and hyperbarics versus an attempt at a ray amputation if [name] is successful in establishing more blood flow here. Electronic Signature(s) Signed: 12/26/2020 5:19:12 PM By: Linton Ham MD Entered By: Linton Ham on 12/26/2020 09:44:28 -------------------------------------------------------------------------------- Physical Exam Details Patient Name: Date of Service: [name] [name] 12/26/2020 8:15 A M Medical Record Number: [id number] Patient Account Number: [id number] Date of Birth/Sex: Treating RN: September 30, 1945 (75 y.o. [name] Primary Care Provider: Consuello Masse Other Clinician: Referring Provider: Treating Provider/Extender: Zadie Cleverly in Treatment: 12 Notes Wound exam; actually looks somewhat better although this still goes 1.5 cm to bone. There is some granulation did looks reasonably healthy, bleeding etc. There was no need for any debridement today Electronic Signature(s) Signed: 12/26/2020 5:19:12 PM By: Linton Ham MD Entered By: Linton Ham on 12/26/2020 09:45:58 -------------------------------------------------------------------------------- Physician Orders Details Patient Name: Date of Service: [name] [name] 12/26/2020 8:15 A M Medical Record Number: [id number] Patient Account Number: [id number] Date of Birth/Sex: Treating RN: 1946/04/16 (74 y.o. [name] Primary Care Provider: Consuello Masse Other Clinician: Referring Provider: Treating Provider/Extender: Zadie Cleverly in Treatment: 12 Verbal / Phone Orders: No Diagnosis Coding ICD-10 Coding Code Description E11.621 Type 2  diabetes mellitus with foot ulcer E11.51 Type 2 diabetes mellitus with diabetic peripheral angiopathy without gangrene L97.528 Non-pressure chronic ulcer of other part of left foot with other specified severity M86.272 Subacute osteomyelitis, left ankle and foot E11.42 Type 2 diabetes mellitus with diabetic polyneuropathy Follow-up Appointments ppointment in 2 weeks. - Tuesday Dr. Dellia Nims Return A Bathing/ Shower/ Hygiene May shower and wash wound with soap and water. - on days changing dressing. Do not soak foot. Edema Control - Lymphedema / SCD / Other Elevate legs to the level of the heart or above for 30 minutes daily and/or when sitting, a frequency of: - throughout the day Avoid standing for long periods of time. Exercise regularly Off-Loading Open toe surgical shoe to: - left foot Additional Orders / Instructions Follow Nutritious Diet Wound Treatment Wound #1 - Metatarsal head fifth Wound Laterality: Plantar, Left Cleanser: Soap and Water 1 x Per Day/30 Days Discharge Instructions: May shower and wash wound with dial antibacterial soap and water prior to dressing change. Cleanser: Wound Cleanser (Generic) 1 x Per Day/30 Days Discharge Instructions: Cleanse the wound with wound cleanser prior to applying a clean dressing using gauze sponges, not tissue or cotton balls.  Prim Dressing: KerraCel Ag Gelling Fiber Dressing, 4x5 in (silver alginate) (Generic) 1 x Per Day/30 Days ary Discharge Instructions: Apply silver alginate to wound bed as instructed Secondary Dressing: Woven Gauze Sponges 2x2 in (Generic) 1 x Per Day/30 Days Discharge Instructions: Apply over primary dressing as directed. Secondary Dressing: Zetuvit Plus Silicone Border Dressing 4x4 (in/in) (DME) (Generic) 1 x Per Day/30 Days Discharge Instructions: Apply silicone border over primary dressing as directed. Secured With: [employer], 4.5x3.1 (in/yd) (Generic) 1 x Per Day/30 Days Discharge Instructions:  Secure with Kerlix as directed. Secured With: 93M Medipore H Soft Cloth Surgical T 4 x 2 (in/yd) (Generic) 1 x Per Day/30 Days ape Discharge Instructions: Secure dressing with tape as directed. Notes Aortogram scheduled with [facility] 01/02/2021. Electronic Signature(s) Signed: 12/26/2020 5:19:12 PM By: Linton Ham MD Signed: 12/26/2020 6:57:58 PM By: Deon Pilling Entered By: Deon Pilling on 12/26/2020 09:26:00 -------------------------------------------------------------------------------- Problem List Details Patient Name: Date of Service: [name] [name] 12/26/2020 8:15 A M Medical Record Number: [id number] Patient Account Number: [id number] Date of Birth/Sex: Treating RN: Jun 21, 1946 (74 y.o. [name] Primary Care Provider: Consuello Masse Other Clinician: Referring Provider: Treating Provider/Extender: Zadie Cleverly in Treatment: 12 Active Problems ICD-10 Encounter Code Description Active Date MDM Diagnosis E11.621 Type 2 diabetes mellitus with foot ulcer 09/27/2020 No Yes E11.51 Type 2 diabetes mellitus with diabetic peripheral angiopathy without gangrene 11/11/2020 No Yes L97.528 Non-pressure chronic ulcer of other part of left foot with other specified 09/27/2020 No Yes severity M86.272 Subacute osteomyelitis, left ankle and foot 12/10/2020 No Yes E11.42 Type 2 diabetes mellitus with diabetic polyneuropathy 09/27/2020 No Yes Inactive Problems ICD-10 Code Description Active Date Inactive Date L03.116 Cellulitis of left lower limb 10/04/2020 10/04/2020 Resolved Problems Electronic Signature(s) Signed: 12/26/2020 5:19:12 PM By: Linton Ham MD Entered By: Linton Ham on 12/26/2020 09:41:35 -------------------------------------------------------------------------------- Progress Note Details Patient Name: Date of Service: [name] [name] 12/26/2020 8:15 A M Medical Record Number: [id number] Patient Account Number: [id number] Date of  Birth/Sex: Treating RN: 11-13-1945 (75 y.o. [name] Primary Care Provider: Consuello Masse Other Clinician: Referring Provider: Treating Provider/Extender: Zadie Cleverly in Treatment: 12 Subjective History of Present Illness (HPI) ADMISSION 09/27/2020 This is a 75 year old man who lives in [place]. He apparently has had callus over the plantar fifth metatarsal head in the past for which she is followed by podiatry in [place]. They shaved down the callus and this was done in January. He states that he went to [place] in January and became aware of when he was there of an open wound in this area. He followed up with podiatry and has been soaking this twice a day with Epson salts and using Silvadene. Apparently one of the podiatrist told him he may need surgery because of bone protrusion. Past medical history includes an ischemic cardiomyopathy, type 2 diabetes with peripheral neuropathy, BPH, PVD, orthostatic hypotension, atrial fibrillation, hyperlipidemia and hypertension Arterial studies in 2018 showed a noncompressible ABI on the left. It was again noncompressible today at 1.7 although his pulses are easily palpable 4/8; patient I admitted to the clinic last week. Wound on the plantar left fifth metatarsal head which has been refractory. He also has a bit of subluxation of the bone in the head. He is a type II diabetic with peripheral neuropathy. We used silver collagen after debridement He arrives back in clinic today. He has erythema spreading into the lateral part of the fifth metatarsal  head. I removed some undermining tissue from around the wound and clearly there is a connection between the wound and the lateral part of the fifth met head. A culture was done. Area of erythema on the lateral part of the foot was marked. His wife says that has been there since Wednesday 4/15; the patient arrived last week with erythema spreading in the lateral part of  the fifth metatarsal head. There was a wound connected with the plantar fifth met head original wound. I gave him empiric Augmentin. Surprisingly the culture I did was negative. We have been using silver alginate. The wound has been open since January. He arrives in clinic today with the erythema much better 4/22; patient presents today for 1 week follow-up of his fifth metatarsal head wound. Daughter is present with the patient. She states that the wound has improved significantly since 2 weeks ago. There is no longer redness to the foot and the actual wound bed is smaller. Patient overall feels well and has no complaints today. 4/29; patient presents for 1 week follow-up of his fifth metatarsal head wound. Daughter is present. Patient has been using silver alginate every other day to the wound site. He has tried to relieve pressure with a surgical shoe. He denies signs of infections. 5/6; fifth metatarsal head wound. He has been using silver alginate changed to Santyl last week which he managed to obtain for $21 with a coupon we gave him. He also has an appointment with vein and vascular. I think [place]. He has noncompressible ABIs. The patient also had an x-ray and that was negative 5/13; the patient went to see [name]. Areas on the left fifth metatarsal head plantar and lateral. [name] noted that his ABI in the right was 0.99 but monophasic waveforms with a TBI at 0.32. He wanted to go ahead with an angiogram which is booked for Thursday. I talked to the patient about this and advised him to go forward with this. 5/23; patient presents for 1 week follow-up. He has been using silver alginate to the wound bed. He has no complaints today. Denies signs of infection. He had an arteriogram done on 5/19. 5/31; angiogram showed patent common femoral, SFA, profunda, above-knee popliteal artery. He had significant tibial disease however with occlusion in the anterior tibial and  posterior tibial occluding in the mid calf. He does have inline flow down the left lower extremity through the peroneal that is widely patent. The peroneal gave off collaterals distally to the anterior tibial that feels retrograde at the ankle. An attempted recanalization of the left anterior tibial artery was made but was not successful. The plan as outlined by [name] is that he will continue getting aggressive wound care and he would consider a retrograde left anterior tibial intervention if there is no improvement. Wound itself does not look as though it is making any improvement. There is still tunneling superiorly precariously close to bone 6/3; back for the obligatory first total contact cast change. The wound clearly has exposed bone. He is a type II diabetic. He has been revascularized. Plain x- ray did not show osteomyelitis he may require an MRI. After his revascularization I elected to put him into a cast to make sure that all the pressure was coming off this area 6/7; patient back for his routine visit. The wound is not doing well. The patient had a lot of discomfort since he was last here 4 days ago. No systemic illness.  He will not go back in the total contact cast today. 6/14; culture I did last time showed Enterobacter. At the time there was erythema and swelling I put him on Augmentin and Bactrim empirically. The Enterobacter was sensitive to the Bactrim DS. He sees [name] tomorrow. I am looking for her recommendation about antibiotic choices and route of administration. He sees [name] of vascular surgery on 6/21. I have communicated with [name] I would like to see whether or not further revascularization will be entertained and whether [name] feels that he could survive a foot conserving surgery if that is the route the patient wishes to go. After these next 2 consultations we may be able to discuss the route the patient wants to take either a surgical 1 or perhaps 1  that involves a prolonged course of antibiotics and hyperbaric oxygen. I touch bases with the patient and his daughter on all of this. MRI suggested osteomyelitis in the distal 2 cm of the fifth metatarsal 6/24; the patient was kindly seen by [name] of infectious disease by telehealth visit. He has established a PICC line and is on a Carbapenem for 4 weeks although I am not really sure which 1. Initial culture was Enterobacter I believe this was a swab. He is also followed up with [name]. He again is considering an anterograde anterior tibial attempted revascularization. The patient is to call if they agree to move forward with the anterograde attempt. He started an IV antibiotics already. He arrives in clinic today with again the wound deteriorating. There is exposed bone widely which is quite a bit worse than last week. I did obtain a piece of this for culture but more bone debridement is likely going to need to be done. I had an extensive discussion with the patient and his daughter. The problem is the ray amputation success depends largely on blood flow. [name] did not sound optimistic about this. However I would definitely go ahead with the retrograde attempt by [name] even though there are potential risks. I explained this to the patient. If we are going to have any attempt to salvage this man's foot we are going to need blood flow and I would go ahead with this. IV antibiotics in the meantime and consideration of hyperbaric oxygen all of these things I discussed. The option would be a below-knee amputation which the patient does not seem ready for his daughter is certainly not ready for. But we also talked about this 6/30; bone culture I did last week showed the same Enterobacter cloacae I as previous. Also rare Staph epidermidis. I am not really sure of the significance of this which may be a contamination. He has a anterior grade access attempt by [name] of vein and vascular  on 7/7. After we see the result of this I will go over his options which are continued antibiotics and hyperbarics versus an attempt at a ray amputation if [name] is successful in establishing more blood flow here. Objective Constitutional Vitals Time Taken: 8:46 AM, Height: 74 in, Source: Stated, Weight: 238 lbs, Source: Stated, BMI: 30.6, Temperature: 97.9 F, Pulse: 60 bpm, Respiratory Rate: 18 breaths/min, Blood Pressure: 104/49 mmHg, Capillary Blood Glucose: 76 mg/dl. General Notes: glucose per pt report Integumentary (Hair, Skin) Wound #1 status is Open. Original cause of wound was Gradually Appeared. The date acquired was: 06/29/2020. The wound has been in treatment 12 weeks. The wound is located on the Left,Plantar Metatarsal head  fifth. The wound measures 1cm length x 2cm width x 1.5cm depth; 1.571cm^2 area and 2.356cm^3 volume. There is bone, tendon, and Fat Layer (Subcutaneous Tissue) exposed. There is no tunneling noted, however, there is undermining starting at 12:00 and ending at 12:00 with a maximum distance of 0.7cm. There is a medium amount of serosanguineous drainage noted. The wound margin is well defined and not attached to the wound base. There is medium (34-66%) red granulation within the wound bed. There is a medium (34-66%) amount of necrotic tissue within the wound bed including Adherent Slough. Assessment Active Problems ICD-10 Type 2 diabetes mellitus with foot ulcer Type 2 diabetes mellitus with diabetic peripheral angiopathy without gangrene Non-pressure chronic ulcer of other part of left foot with other specified severity Subacute osteomyelitis, left ankle and foot Type 2 diabetes mellitus with diabetic polyneuropathy Plan Follow-up Appointments: Return Appointment in 2 weeks. - Tuesday Dr. Dellia Nims Bathing/ Shower/ Hygiene: May shower and wash wound with soap and water. - on days changing dressing. Do not soak foot. Edema Control - Lymphedema / SCD /  Other: Elevate legs to the level of the heart or above for 30 minutes daily and/or when sitting, a frequency of: - throughout the day Avoid standing for long periods of time. Exercise regularly Off-Loading: Open toe surgical shoe to: - left foot Additional Orders / Instructions: Follow Nutritious Diet General Notes: Aortogram scheduled with [facility] 01/02/2021. WOUND #1: - Metatarsal head fifth Wound Laterality: Plantar, Left Cleanser: Soap and Water 1 x Per Day/30 Days Discharge Instructions: May shower and wash wound with dial antibacterial soap and water prior to dressing change. Cleanser: Wound Cleanser (Generic) 1 x Per Day/30 Days Discharge Instructions: Cleanse the wound with wound cleanser prior to applying a clean dressing using gauze sponges, not tissue or cotton balls. Prim Dressing: KerraCel Ag Gelling Fiber Dressing, 4x5 in (silver alginate) (Generic) 1 x Per Day/30 Days ary Discharge Instructions: Apply silver alginate to wound bed as instructed Secondary Dressing: Woven Gauze Sponges 2x2 in (Generic) 1 x Per Day/30 Days Discharge Instructions: Apply over primary dressing as directed. Secondary Dressing: Zetuvit Plus Silicone Border Dressing 4x4 (in/in) (DME) (Generic) 1 x Per Day/30 Days Discharge Instructions: Apply silicone border over primary dressing as directed. Secured With: [employer], 4.5x3.1 (in/yd) (Generic) 1 x Per Day/30 Days Discharge Instructions: Secure with Kerlix as directed. Secured With: 91M Medipore H Soft Cloth Surgical T 4 x 2 (in/yd) (Generic) 1 x Per Day/30 Days ape Discharge Instructions: Secure dressing with tape as directed. 1. I am going to wait for the patient's revascularization attempt by vein and vascular on 7/7. As I understand things this will be an anterograde attempt at revascularizing the anterior tibial artery. 2. The patient is on IV antibiotics as directed by Dr. Baxter Flattery 3. After we see the result of the attempt at  revascularization the patient will have another set of discussion decisions to make which will include antibiotics plus hyperbarics for ray amputation. I will go over this with him again. 4. In the meantime I have cautioned him that if there is spreading erythema out of his foot purulence or systemic symptoms such as fever and chills he will need to seek semiurgent medical attention 5. The Enterobacter we cultured last week I think is the culprit here and was what we cultured with a swab culture previously. I think the staff epidermidis is of probably no consequence at this point Electronic Signature(s) Signed: 12/26/2020 5:19:12 PM By: Linton Ham MD  Entered By: Linton Ham on 12/26/2020 09:49:15 -------------------------------------------------------------------------------- SuperBill Details Patient Name: Date of Service: [name] [name], [name] [name] 12/26/2020 Medical Record Number: [id number] Patient Account Number: [id number] Date of Birth/Sex: Treating RN: 10-10-1945 (75 y.o. 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, left ankle and foot E11.42 Type 2 diabetes mellitus with diabetic polyneuropathy Facility Procedures Physician Procedures : CPT4 Code Description Modifier [id number] - WC PHYS LEVEL 4 - EST PT ICD-10 Diagnosis Description E11.621 Type 2 diabetes mellitus with foot ulcer E11.51 Type 2 diabetes mellitus with diabetic peripheral angiopathy without gangrene M86.272 Subacute  osteomyelitis, left ankle and foot L97.528 Non-pressure chronic ulcer of  other part of left foot with other specified severity Quantity: 1 Electronic Signature(s) Signed: 12/26/2020 5:19:12 PM By: Linton Ham MD Entered By: Linton Ham on 12/26/2020 09:48:43

## 2020-12-27 DIAGNOSIS — M869 Osteomyelitis, unspecified: Secondary | ICD-10-CM | POA: Diagnosis not present

## 2020-12-27 DIAGNOSIS — E1142 Type 2 diabetes mellitus with diabetic polyneuropathy: Secondary | ICD-10-CM | POA: Diagnosis not present

## 2020-12-27 DIAGNOSIS — E11621 Type 2 diabetes mellitus with foot ulcer: Secondary | ICD-10-CM | POA: Diagnosis not present

## 2020-12-27 DIAGNOSIS — L03116 Cellulitis of left lower limb: Secondary | ICD-10-CM | POA: Diagnosis not present

## 2020-12-27 DIAGNOSIS — L97528 Non-pressure chronic ulcer of other part of left foot with other specified severity: Secondary | ICD-10-CM | POA: Diagnosis not present

## 2020-12-27 DIAGNOSIS — E1169 Type 2 diabetes mellitus with other specified complication: Secondary | ICD-10-CM | POA: Diagnosis not present

## 2020-12-31 NOTE — Progress Notes (Signed)
[name] [name], [name] [name] ([id number]) Visit Report for 12/26/2020 Arrival Information Details Patient Name: Date of Service: [name] [name], [name] [name] 12/26/2020 8:15 A M Medical Record Number: [id number] Patient Account Number: [id number] Date of Birth/Sex: Treating RN: 06/18/1946 (75 y.o. [name] [name] Primary Care Mikiah Durall: Consuello Masse Other Clinician: Referring Demari Kropp: Treating Marquee Fuchs/Extender: Zadie Cleverly in Treatment: 12 Visit Information History Since Last Visit Added or deleted any medications: No Patient Arrived: [name] [name] Any new allergies or adverse reactions: No Arrival Time: 08:43 Had a fall or experienced change in No Accompanied By: spouse activities of daily living that may affect Transfer Assistance: None risk of falls: Patient Identification Verified: Yes Signs or symptoms of abuse/neglect since No Secondary Verification Process Completed: Yes last visito Patient Requires Transmission-Based Precautions: No Hospitalized since last visit: No Patient Has Alerts: No Implantable device outside of the clinic No excluding cellular tissue based products placed in the center since last visit: Has Dressing in Place as Prescribed: Yes Has Footwear/Offloading in Place as Yes Prescribed: Left: Surgical Shoe with Pressure Relief Insole Pain Present Now: Yes Electronic Signature(s) Signed: 12/26/2020 6:41:14 PM By: Baruch Gouty RN, BSN Entered By: Baruch Gouty on 12/26/2020 08:48:44 -------------------------------------------------------------------------------- Clinic Level of Care Assessment Details Patient Name: Date of Service: [name] [name] 12/26/2020 8:15 A M Medical Record Number: [id number] Patient Account Number: [id number] Date of Birth/Sex: Treating RN: [birth year] (75 y.o. [name] [name] Primary Care Keianna Signer: Consuello Masse Other Clinician: Referring Suellen Durocher: Treating Maliik Karner/Extender: Zadie Cleverly in  Treatment: 12 Clinic Level of Care Assessment Items TOOL 4 Quantity Score X- 1 0 Use when only an EandM is performed on FOLLOW-UP visit ASSESSMENTS - Nursing Assessment / Reassessment X- 1 10 Reassessment of Co-morbidities (includes updates in patient status) X- 1 5 Reassessment of Adherence to Treatment Plan ASSESSMENTS - Wound and Skin A ssessment / Reassessment X - Simple Wound Assessment / Reassessment - one wound 1 5 []  - 0 Complex Wound Assessment / Reassessment - multiple wounds X- 1 10 Dermatologic / Skin Assessment (not related to wound area) ASSESSMENTS - Focused Assessment X- 1 5 Circumferential Edema Measurements - multi extremities X- 1 10 Nutritional Assessment / Counseling / Intervention []  - 0 Lower Extremity Assessment (monofilament, tuning fork, pulses) []  - 0 Peripheral Arterial Disease Assessment (using hand held doppler) ASSESSMENTS - Ostomy and/or Continence Assessment and Care []  - 0 Incontinence Assessment and Management []  - 0 Ostomy Care Assessment and Management (repouching, etc.) PROCESS - Coordination of Care X - Simple Patient / Family Education for ongoing care 1 15 []  - 0 Complex (extensive) Patient / Family Education for ongoing care []  - 0 Staff obtains Programmer, systems, Records, T Results / Process Orders est []  - 0 Staff telephones HHA, Nursing Homes / Clarify orders / etc []  - 0 Routine Transfer to another Facility (non-emergent condition) []  - 0 Routine Hospital Admission (non-emergent condition) []  - 0 New Admissions / Biomedical engineer / Ordering NPWT Apligraf, etc. , []  - 0 Emergency Hospital Admission (emergent condition) X- 1 10 Simple Discharge Coordination []  - 0 Complex (extensive) Discharge Coordination PROCESS - Special Needs []  - 0 Pediatric / Minor Patient Management []  - 0 Isolation Patient Management []  - 0 Hearing / Language / Visual special needs []  - 0 Assessment of Community assistance (transportation,  D/C planning, etc.) []  - 0 Additional assistance / Altered mentation []  - 0 Support Surface(s) Assessment (bed, cushion, seat, etc.) INTERVENTIONS - Wound Cleansing / Measurement X - Simple  Wound Cleansing - one wound 1 5 []  - 0 Complex Wound Cleansing - multiple wounds X- 1 5 Wound Imaging (photographs - any number of wounds) []  - 0 Wound Tracing (instead of photographs) X- 1 5 Simple Wound Measurement - one wound []  - 0 Complex Wound Measurement - multiple wounds INTERVENTIONS - Wound Dressings X - Small Wound Dressing one or multiple wounds 1 10 []  - 0 Medium Wound Dressing one or multiple wounds []  - 0 Large Wound Dressing one or multiple wounds []  - 0 Application of Medications - topical []  - 0 Application of Medications - injection INTERVENTIONS - Miscellaneous []  - 0 External ear exam []  - 0 Specimen Collection (cultures, biopsies, blood, body fluids, etc.) []  - 0 Specimen(s) / Culture(s) sent or taken to Lab for analysis []  - 0 Patient Transfer (multiple staff / Civil Service fast streamer / Similar devices) []  - 0 Simple Staple / Suture removal (25 or less) []  - 0 Complex Staple / Suture removal (26 or more) []  - 0 Hypo / Hyperglycemic Management (close monitor of Blood Glucose) []  - 0 Ankle / Brachial Index (ABI) - do not check if billed separately X- 1 5 Vital Signs Has the patient been seen at the hospital within the last three years: Yes Total Score: 100 Level Of Care: New/Established - Level 3 Electronic Signature(s) Signed: 12/26/2020 6:57:58 PM By: Deon Pilling Entered By: Deon Pilling on 12/26/2020 09:24:34 -------------------------------------------------------------------------------- Encounter Discharge Information Details Patient Name: Date of Service: [name] [name] 12/26/2020 8:15 A M Medical Record Number: [id number] Patient Account Number: [id number] Date of Birth/Sex: Treating RN: Jun 24, 1946 (75 y.o. [name] [name] Primary Care [place] Kirchner:  Consuello Masse Other Clinician: Referring Rhianon Zabawa: Treating Stephane Junkins/Extender: Zadie Cleverly in Treatment: 12 Encounter Discharge Information Items Discharge Condition: Stable Ambulatory Status: Ambulatory Discharge Destination: Home Transportation: Private Auto Accompanied By: alone Schedule Follow-up Appointment: No Clinical Summary of Care: Patient Declined Electronic Signature(s) Signed: 12/31/2020 6:17:43 PM By: Leane Call Entered By: Leane Call on 12/26/2020 10:33:13 -------------------------------------------------------------------------------- Lower Extremity Assessment Details Patient Name: Date of Service: [name] [name], [name] [name] 12/26/2020 8:15 A M Medical Record Number: [id number] Patient Account Number: [id number] Date of Birth/Sex: Treating RN: 12/03/1945 (75 y.o. [name] [name] Primary Care Gwenyth Dingee: Consuello Masse Other Clinician: Referring Vilda Zollner: Treating Derryck Shahan/Extender: Zadie Cleverly in Treatment: 12 Edema Assessment Assessed: [name] [name]: No] Patrice Paradise: No] Edema: [Left: Ye] [Right: s] Calf Left: Right: Point of Measurement: 40 cm From Medial Instep 38 cm Ankle Left: Right: Point of Measurement: 10 cm From Medial Instep 26.2 cm Vascular Assessment Pulses: Dorsalis Pedis Palpable: [Left:No] Electronic Signature(s) Signed: 12/26/2020 6:41:14 PM By: Baruch Gouty RN, BSN Entered By: Baruch Gouty on 12/26/2020 08:52:04 -------------------------------------------------------------------------------- Multi Wound Chart Details Patient Name: Date of Service: [name] [name] 12/26/2020 8:15 A M Medical Record Number: [id number] Patient Account Number: [id number] Date of Birth/Sex: Treating RN: Dec 01, 1945 (74 y.o. [name] [name] Primary Care Kenzie Flakes: Consuello Masse Other Clinician: Referring Dayton Kenley: Treating Janasha Barkalow/Extender: Zadie Cleverly in Treatment: 12 Vital  Signs Height(in): 74 Capillary Blood Glucose(mg/dl): 76 Weight(lbs): 238 Pulse(bpm): 55 Body Mass Index(BMI): 31 Blood Pressure(mmHg): 104/49 Temperature(F): 97.9 Respiratory Rate(breaths/min): 18 Photos: [1:No Photos Left, Plantar Metatarsal head fifth N/A] [N/A:N/A] Wound Location: [1:Gradually Appeared] [N/A:N/A] Wounding Event: [1:Diabetic Wound/Ulcer of the Lower] [N/A:N/A] Primary Etiology: [1:Extremity Sleep Apnea, Arrhythmia, Congestive N/A] Comorbid History: [1:Heart Failure, Coronary Artery Disease, Type II Diabetes, Osteoarthritis, Neuropathy 06/29/2020] [N/A:N/A] Date Acquired: [1:12] [N/A:N/A] Weeks of Treatment: [1:Open] [N/A:N/A]  Wound Status: [1:1x2x1.5] [N/A:N/A] Measurements L x W x D (cm) [1:1.571] [N/A:N/A] A (cm) : rea [1:2.356] [N/A:N/A] Volume (cm) : [1:-2112.70%] [N/A:N/A] % Reduction in A rea: [1:-11119.00%] [N/A:N/A] % Reduction in Volume: [1:12] Starting Position 1 (o'clock): [1:12] Ending Position 1 (o'clock): [1:0.7] Maximum Distance 1 (cm): [1:Yes] [N/A:N/A] Undermining: [1:Grade 3] [N/A:N/A] Classification: [1:Medium] [N/A:N/A] Exudate A mount: [1:Serosanguineous] [N/A:N/A] Exudate Type: [1:red, brown] [N/A:N/A] Exudate Color: [1:Well defined, not attached] [N/A:N/A] Wound Margin: [1:Medium (34-66%)] [N/A:N/A] Granulation A mount: [1:Red] [N/A:N/A] Granulation Quality: [1:Medium (34-66%)] [N/A:N/A] Necrotic A mount: [1:Fat Layer (Subcutaneous Tissue): Yes N/A] Exposed Structures: [1:Tendon: Yes Bone: Yes Fascia: No Muscle: No Joint: No None] [N/A:N/A] Epithelialization: Treatment Notes Electronic Signature(s) Signed: 12/26/2020 5:19:12 PM By: Linton Ham MD Signed: 12/26/2020 6:57:58 PM By: Deon Pilling Entered By: Linton Ham on 12/26/2020 09:41:44 -------------------------------------------------------------------------------- Multi-Disciplinary Care Plan Details Patient Name: Date of Service: [name] [name] 12/26/2020 8:15 A  M Medical Record Number: [id number] Patient Account Number: [id number] Date of Birth/Sex: Treating RN: 07-06-1945 (74 y.o. [name] [name] Primary Care Uzma Hellmer: Consuello Masse Other Clinician: Referring Rene Gonsoulin: Treating Eeva Schlosser/Extender: Zadie Cleverly in Treatment: 12 Multidisciplinary Care Plan reviewed with physician Active Inactive Nutrition Nursing Diagnoses: Impaired glucose control: actual or potential Goals: Patient/caregiver verbalizes understanding of need to maintain therapeutic glucose control per primary care physician Date Initiated: 09/27/2020 Target Resolution Date: 01/17/2021 Goal Status: Active Interventions: Assess HgA1c results as ordered upon admission and as needed Assess patient nutrition upon admission and as needed per policy Provide education on elevated blood sugars and impact on wound healing Provide education on nutrition Treatment Activities: Education provided on Nutrition : 11/26/2020 Notes: 10/25/20: Glucose control ongoing. Electronic Signature(s) Signed: 12/26/2020 6:57:58 PM By: Deon Pilling Entered By: Deon Pilling on 12/26/2020 09:15:32 -------------------------------------------------------------------------------- Pain Assessment Details Patient Name: Date of Service: [name] [name], [name] [name] 12/26/2020 8:15 A M Medical Record Number: [id number] Patient Account Number: [id number] Date of Birth/Sex: Treating RN: June 11, 1946 (75 y.o. [name] [name] Primary Care Agam Davenport: Consuello Masse Other Clinician: Referring Taliana Mersereau: Treating Srihith Aquilino/Extender: Zadie Cleverly in Treatment: 12 Active Problems Location of Pain Severity and Description of Pain Patient Has Paino Yes Site Locations Pain Location: Pain in Ulcers With Dressing Change: No Duration of the Pain. Constant / Intermittento Intermittent Rate the pain. Current Pain Level: 0 Worst Pain Level: 7 Least Pain Level: 0 Character of  Pain Describe the Pain: Burning, Sharp Pain Management and Medication Current Pain Management: Medication: Yes Is the Current Pain Management Adequate: Adequate How does your wound impact your activities of daily livingo Sleep: No Bathing: No Appetite: No Relationship With Others: No Bladder Continence: No Emotions: No Bowel Continence: No Work: No Toileting: No Drive: No Dressing: No Hobbies: No Electronic Signature(s) Signed: 12/26/2020 6:41:14 PM By: Baruch Gouty RN, BSN Entered By: Baruch Gouty on 12/26/2020 08:48:29 -------------------------------------------------------------------------------- Patient/Caregiver Education Details Patient Name: Date of Service: [name] [name] 6/30/2022andnbsp8:15 A M Medical Record Number: [id number] Patient Account Number: [id number] Date of Birth/Gender: Treating RN: 1946/03/18 (75 y.o. [name] [name] Primary Care Physician: Consuello Masse Other Clinician: Referring Physician: Treating Physician/Extender: Zadie Cleverly in Treatment: 12 Education Assessment Education Provided To: Patient Education Topics Provided Elevated Blood Sugar/ Impact on Healing: Handouts: Elevated Blood Sugars: How Do They Affect Wound Healing Methods: Explain/Verbal Responses: Reinforcements needed Infection: Handouts: Infection Prevention and Management Methods: Explain/Verbal Responses: Reinforcements needed Tissue Oxygenation: Handouts: Peripheral Arterial Disease and Related Ulcers Methods: Explain/Verbal Responses: Reinforcements needed Electronic Signature(s)  Signed: 12/26/2020 6:57:58 PM By: Deon Pilling Entered By: Deon Pilling on 12/26/2020 09:16:00 -------------------------------------------------------------------------------- Wound Assessment Details Patient Name: Date of Service: [name] [name], [name] [name] 12/26/2020 8:15 A M Medical Record Number: [id number] Patient Account Number: [id number] Date of  Birth/Sex: Treating RN: 31-Jan-1946 (75 y.o. [name] [name] Primary Care Nicholaus Steinke: Consuello Masse Other Clinician: Referring Dominque Levandowski: Treating Ellakate Gonsalves/Extender: Zadie Cleverly in Treatment: 12 Wound Status Wound Number: 1 Primary Diabetic Wound/Ulcer of the Lower Extremity Etiology: Wound Location: Left, Plantar Metatarsal head fifth Wound Open Wounding Event: Gradually Appeared Status: Date Acquired: 06/29/2020 Comorbid Sleep Apnea, Arrhythmia, Congestive Heart Failure, Coronary Weeks Of Treatment: 12 History: Artery Disease, Type II Diabetes, Osteoarthritis, Neuropathy Clustered Wound: No Photos Wound Measurements Length: (cm) 1 Width: (cm) 2 Depth: (cm) 1.5 Area: (cm) 1.571 Volume: (cm) 2.356 % Reduction in Area: -2112.7% % Reduction in Volume: -11119% Epithelialization: None Tunneling: No Undermining: Yes Starting Position (o'clock): 12 Ending Position (o'clock): 12 Maximum Distance: (cm) 0.7 Wound Description Classification: Grade 3 Wound Margin: Well defined, not attached Exudate Amount: Medium Exudate Type: Serosanguineous Exudate Color: red, brown Foul Odor After Cleansing: No Slough/Fibrino Yes Wound Bed Granulation Amount: Medium (34-66%) Exposed Structure Granulation Quality: Red Fascia Exposed: No Necrotic Amount: Medium (34-66%) Fat Layer (Subcutaneous Tissue) Exposed: Yes Necrotic Quality: Adherent Slough Tendon Exposed: Yes Muscle Exposed: No Joint Exposed: No Bone Exposed: Yes Treatment Notes Wound #1 (Metatarsal head fifth) Wound Laterality: Plantar, Left Cleanser Soap and Water Discharge Instruction: May shower and wash wound with dial antibacterial soap and water prior to dressing change. Wound Cleanser Discharge Instruction: Cleanse the wound with wound cleanser prior to applying a clean dressing using gauze sponges, not tissue or cotton balls. Peri-Wound Care Topical Primary Dressing KerraCel Ag Gelling Fiber  Dressing, 4x5 in (silver alginate) Discharge Instruction: Apply silver alginate to wound bed as instructed Secondary Dressing Woven Gauze Sponges 2x2 in Discharge Instruction: Apply over primary dressing as directed. Zetuvit Plus Silicone Border Dressing 4x4 (in/in) Discharge Instruction: Apply silicone border over primary dressing as directed. Secured With [employer], 4.5x3.1 (in/yd) Discharge Instruction: Secure with Kerlix as directed. 28M Medipore H Soft Cloth Surgical T 4 x 2 (in/yd) ape Discharge Instruction: Secure dressing with tape as directed. Compression Wrap Compression Stockings Add-Ons Electronic Signature(s) Signed: 12/27/2020 10:25:49 AM By: Sandre Kitty Signed: 12/27/2020 1:24:03 PM By: Baruch Gouty RN, BSN Previous Signature: 12/26/2020 6:41:14 PM Version By: Baruch Gouty RN, BSN Entered By: Sandre Kitty on 12/27/2020 09:45:39 -------------------------------------------------------------------------------- Burns Details Patient Name: Date of Service: [name] [name] 12/26/2020 8:15 A M Medical Record Number: [id number] Patient Account Number: [id number] Date of Birth/Sex: Treating RN: Nov 24, 1945 (74 y.o. [name] [name] Primary Care Ashauna Bertholf: Consuello Masse Other Clinician: Referring Imri Lor: Treating Aretha Levi/Extender: Zadie Cleverly in Treatment: 12 Vital Signs Time Taken: 08:46 Temperature (F): 97.9 Height (in): 74 Pulse (bpm): 60 Source: Stated Respiratory Rate (breaths/min): 18 Weight (lbs): 238 Blood Pressure (mmHg): 104/49 Source: Stated Capillary Blood Glucose (mg/dl): 76 Body Mass Index (BMI): 30.6 Reference Range: 80 - 120 mg / dl Notes glucose per pt report Electronic Signature(s) Signed: 12/26/2020 6:41:14 PM By: Baruch Gouty RN, BSN Entered By: Baruch Gouty on 12/26/2020 08:47:20

## 2021-01-01 ENCOUNTER — Encounter: Payer: Self-pay | Admitting: Internal Medicine

## 2021-01-01 ENCOUNTER — Ambulatory Visit (INDEPENDENT_AMBULATORY_CARE_PROVIDER_SITE_OTHER): Payer: Medicare Other | Admitting: Internal Medicine

## 2021-01-01 ENCOUNTER — Other Ambulatory Visit: Payer: Self-pay

## 2021-01-01 VITALS — BP 120/58 | HR 61 | Ht 74.0 in | Wt 231.4 lb

## 2021-01-01 DIAGNOSIS — I4891 Unspecified atrial fibrillation: Secondary | ICD-10-CM

## 2021-01-01 DIAGNOSIS — M869 Osteomyelitis, unspecified: Secondary | ICD-10-CM | POA: Diagnosis not present

## 2021-01-01 DIAGNOSIS — E1169 Type 2 diabetes mellitus with other specified complication: Secondary | ICD-10-CM | POA: Diagnosis not present

## 2021-01-01 DIAGNOSIS — E11621 Type 2 diabetes mellitus with foot ulcer: Secondary | ICD-10-CM | POA: Diagnosis not present

## 2021-01-01 DIAGNOSIS — I10 Essential (primary) hypertension: Secondary | ICD-10-CM

## 2021-01-01 DIAGNOSIS — L97528 Non-pressure chronic ulcer of other part of left foot with other specified severity: Secondary | ICD-10-CM | POA: Diagnosis not present

## 2021-01-01 DIAGNOSIS — Z951 Presence of aortocoronary bypass graft: Secondary | ICD-10-CM

## 2021-01-01 DIAGNOSIS — L03116 Cellulitis of left lower limb: Secondary | ICD-10-CM | POA: Diagnosis not present

## 2021-01-01 DIAGNOSIS — E1142 Type 2 diabetes mellitus with diabetic polyneuropathy: Secondary | ICD-10-CM | POA: Diagnosis not present

## 2021-01-01 DIAGNOSIS — I255 Ischemic cardiomyopathy: Secondary | ICD-10-CM

## 2021-01-01 NOTE — Progress Notes (Signed)
OFFICE NOTE  Chief Complaint:  Follow-up heart failure  Primary Care Physician: [name] [name], MD  HPI:  [name] [name] is a pleasant 75 year old gentleman previously followed by Dr. Rollene Fare. His past medical history is significant for coronary artery disease. In 2013 he underwent multivessel CABG for an ischemic cardiomyopathy. EF was 20-25% prior to surgery however post surgery his EF had improved up to 45-50%. Recently his EF was 40-45% by echo in May of 2014. There was mild mitral regurgitation, mild to moderately dilated left atrium and mild LVH. [name] [name] has been describing some anxiety. He also reports some pain in his legs however underwent Dopplers in September 2014 which showed preserved ABIs bilaterally a 1.0 on the right and 1.1 on the left. The bilateral peroneal and posterior tibial arteries were occluded. He reports some optimal control of his diabetes. His A1c was 7.2. He is concerned about the cost of taking Tradjenta. He is followed by Dr. Elyse Hsu.   He was recently seen in the emergency room and he can hospital. There he presented with hypotension and was given 1 L normal saline and his symptoms improved. He had been having dizziness as well as some upper back pain into the left shoulder blade with exertion recently. The symptoms are worse particularly when climbing up ladders or going up stairs and are relieved at rest. After that hospitalization it was recommended that he discontinue his ACE inhibitor and beta blocker, and his blood pressure appears improved today up to 110/60.  [name] [name] returns today for followup of his stress test. This is interpreted as nonischemic with an EF of 44%. He reports still feeling very fatigued and extremely short of breath when doing activities. He says that the other day he tried to hit golf balls and felt that he was totally exhausted after just an hour. He reports his blood pressure has improved somewhat off of all medications  however remains low. He has had symptoms of orthostatic hypotension in the past. His symptoms do feel similar to prior to his bypass surgery. He also feels that he was much better after surgery than he is now and that his symptoms have been going on for the past 2 months.  At his last office visit, I recommended that [name] [name] have a repeat cardiac catheterization (08/2013). This demonstrated the following:  Impression:  1. 2 vessel native CAD with occluded LAD in the mid-vessel  2. Patent LIMA-LAD, SVG to PLA and SVG to PDA grafts with TIMI III flow  3. LVEDP = 13 mmHg  4. Random PVC's were noted  Based on these findings, I could not find any new cardiac cause of his symptoms. I suspect that some of his fatigue could be related to labile blood sugars. He says unexplained hypotension but is normotensive off of medications.  [name] [name] returns today for followup. He now reports feeling better since he had change in his medications. He is established with Dr. Buddy Duty who adjusted his diabetes medicines and stopped his Invokana. His shortness of breath and fatigue in both improved. He is now been expected some problems with left heel pain. He was found to have some spinal stenosis but has had improvement with inserts in his shoes.  I saw [name] [name] back in the office today. He has successfully underwent back surgery earlier this year which she says initially was much improved, however he says he subsequently developed more pain going down his legs. This sounds like  it's neuropathic pain, but is reluctant to try medication for it. He also significantly fatigue. This could be due to a number of etiologies. 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 Also, he is noted to have a history of obstructive sleep apnea. He was placed on BiPAP, but has not been compliant with that due to difficulty wearing the mask.  He also never had follow-up of his sleep study.  [name] [name] returns today for follow-up. He reports he's had worsening dyspnea and fatigue. He says that he can hardly sleep at night. He is struggling with significant anxiety. 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 He's not been wearing the BiPAP due to significant anxiety and difficulty sleeping. He feels like he gets smothered with his machine. Communication between Dr. Radford Pax and Dr. Quintin Alto led to the starting of Celexa as well as Klonopin to use as needed at night. He says that it does give him about 6 hours of sleep. He has not been using his BiPAP as instructed. He is reportedly had more worsening shortness of breath. He was seen in the ER for this and it was thought this was due [email or web address] with BiPAP. His BNP however is elevated over 300. Today in the office he says that he's had more swelling and more abdominal fullness as well as leg edema.  I had the pleasure seeing [name] [name] back today in the office. He seems to respond nicely to Lasix. I started him on 40 mg daily and while I was out of town my partner reviewed his lab work which included an elevated BNP over 300. He increased his Lasix to 40 mg twice a day. [name] [name] says he's had a significant improvement in his breathing. The BNP now is in the mid 200s. Renal function is stable based on labs today. Unfortunately, his echocardiogram does show a new cardiomyopathy with EF 30-35%. It should be noted that he felt "awful" on blood pressure/heart failure medicines and had taken himself off of ACE inhibitor and beta blocker in the past. The echo however does suggest inferior and anterolateral wall motion abnormalities which are new and could indicate graft dysfunction. I performed his last heart catheterization in 2015 which showed all 3 grafts patent.  [name] [name] returns today for follow-up of his left heart catheterization. I  perform this a few weeks ago and he had patent bypass grafts however LV function is reduced. Cardiac output is also reduced. Filling pressures were mildly elevated. He reports since discharge that he's had some improvement of his symptoms. He was started on low-dose beta blocker but he does report fatigue. When I asked him how they compared to previously he said he thinks attack sleep better since he started on the beta blocker but not back to what he thinks his baseline should be. He continues to have problems with left leg pain. He had ultrasound of the left leg which were unrevealing. He also had nerve conduction studies which I sent him for which were not suggestive of neuropathy. His symptoms were worse while lying flat on the Cath Lab table and I suspect this could be again coming from his back and he may need repeat orthopedic evaluation. His primary care provider started him on Celexa recently as well for anxiety.  12/23/2015  [name] [name] was seen back in the  office today in follow-up. Overall he seems to be doing well. He's tolerating low-dose carvedilol and has had some very infrequent morning dizziness. He was previously on Lasix 40 mg twice a day but ran out of the medicine about 8 days ago. He's not had any worsening weight gain or swelling nor worsening shortness of breath over the past week. This could be that he's had some improvement in his cardiomyopathy. Nevertheless, I would like him to be on some Lasix at least until we can determine whether or not he is euvolemic with lab work.  05/27/2016  [name] [name] returns today for follow-up. He's gained about 6 pounds of weight since I last saw him. He denies any worsening shortness of breath or orthostatic dizziness. He saw Dr. Radford Pax in August for following of his obstructive sleep apnea. He remains physically active and denies any chest pain. He recently had a repeat echo in October 2017 which showed a small improvement in LV function with EF up  to 35-40%. Heart failure regimen includes aspirin, carvedilol, Crestor, and spironolactone. He is not currently on a ARB is he's had orthostatic symptoms in the past although that his generally resolved. His creatinine is normal.  08/24/2016  I saw [name] [name] today in follow-up. He again gained about 4 pounds over the past month. He felt that the weight gain was heart failure related because he got more short of breath particularly walking up stairs. Based on that he increased his Lasix back to 40 mg daily. He notes that his urine is been darker and his urination is not necessarily improved. He's also been more dizzy. He says he gets somewhat presyncopal with change in position. Lab work 12 days ago indicated a stable creatinine however. Despite this, I feel that he may be somewhat presyncopal perhaps on too high of a dose of diuretics. He is also on Entresto 24/26 and Aldactone 12.5 mg daily. He also complained of some pain across the back between the shoulder blades. He said this got better with resting and drinking water. It's difficult to say whether that it was the water or perhaps resting from his activities. I cannot exclude that this could be angina. His EKG however is reassuring today.  09/21/2016  [name] [name] was seen today in follow-up. I decreased his Lasix, however he does not feel any change in his dizziness. There is no evidence of presyncope. He has gained about 4 pounds which I suspect is some fluid retention. Blood pressure is well-controlled today 118/56.  02/26/2017  [name] [name] returns today for follow-up. He had more recent dizziness and it seemed to worsen on Entresto. He discontinued that and feels that he's now much better. Overall he says he feels well. He is not on ACE inhibitor or ARB but remains on carvedilol. Is also on Lasix 40 mg daily. Blood pressure is stable 122/52.  08/10/2017  [name] [name] returns for follow-up.  Overall he seems to be feeling very well.  Denies any chest  pain or worsening shortness of breath.  He gets occasional dizziness but that is much improved.  Of note an EKG was performed again today and there is a suggestion that there may be underlying atrial flutter.  Heart rate was 74 and EKG looks very similar to his prior EKG.  The computer has interpreted a sinus rhythm with first-degree AV block and bifascicular block.  I compared EKG to his previous EKG last August which looks similar however it is distinctly different from the  EKG last of February.  He is asymptomatic, but not anticoagulated.  Otherwise, labs reviewed from October indicate total cholesterol 134, HDL 36 LDL 67 and triglycerides 157.  Hemoglobin A1c of 7.6 and serum creatinine 0.9.  08/30/2017  [name] [name] returns today for follow-up of his studies.  He underwent an echocardiogram to evaluate for change in LVEF, but more importantly to rule out atrial flutter.  His EKG was abnormal and suggested possible atrial flutter, however he was asymptomatic.  The echo did not show any evidence of atrial flutter.  LVEF was stable at 40%.  He also underwent a home 48-hour Holter monitor.  This demonstrated sinus rhythm with PACs and PVCs, but no evidence of atrial fibrillation or flutter.  Remains asymptomatic.  His only other complaint today is chronic leg pain.  He was seen by Dr. Gwenlyn Found and evaluated with lower extremity arterial Dopplers.  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  A referral to neurosurgery was suggested but not placed.  01/24/2018  Mr. Ferrall returns today for follow-up.  Overall he says he is doing really well.  He underwent 2 back injections and has had improvement in his back pain and leg pain.  He had an episode in June where he became somewhat dehydrated.  He presented to the ER with orthostatic hypotension.  His Lasix was cut back to 20 mg daily.  Weight is stayed stable effect is been improved from 252-246.  He says he is actually had  enough energy to play golf recently.  08/01/2018  Mr. Qin seen today in routine follow-up.  He has some occasional dizziness which is persistent.  He says he gets a little lightheaded when he urinates.  He has been off of tamsulosin with no specific benefit and now is taking the medication every third day.  Blood pressure is stable.  Weight is stable.  He denies any chest pain or worsening shortness of breath with exertion.  His last echo showed an EF of 40% which is stable if not slightly improved from an echo in 2017.  Unfortunately he could not tolerate Entresto and remains on carvedilol.  He endorses NYHA class I-II heart failure symptoms.  Recent lab work showed total cholesterol 151, HDL 39, LDL 79 triglycerides 166.  Hemoglobin A1c of 7.3.  03/30/2019  Mr. Isbell seen today in follow-up.  Overall he is doing well and has no complaints.  He has not recently struggled with any of the dizziness he has had previously.  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  In the past he has had EKG changes that were questionable for possible atrial flutter however monitoring as well as an echocardiogram failed to show any evidence of atrial flutter or fibrillation.  This is now clear finding of atrial fibrillation on EKG with an irregularly irregular rhythm with no evidence of P waves.  He reports being asymptomatic with this.  05/12/2019   Mr. Sedivy returns today for follow-up of his A. fib.  He reports he has been compliant with Eliquis.  He says he had a couple episodes of loose stools.  He thought it might be due to the medicine but this seems quite atypical.  He stopped some of his stool softener/bulking agents.  Weight is down about 3 pounds.  He increased his Lasix because he felt like it was a little wheezy.  Possibly could have had some more heart failure because he  is in A. fib and does have a history of heart failure.  EKG again shows atrial  flutter/fibrillation with variable AV response at 74.  Since this is presumably persistent at this point we discussed an elective cardioversion and he is agreeable to this.  06/13/2019  Mr. Markell was seen today in follow-up.  He underwent cardioversion by myself in November.  Since then he has felt fairly well.  His EKG today shows that he is maintaining sinus rhythm.  Blood pressure was 119/63 however he has had some positional dizziness at home.  His daughter notes he has had some blood pressures with low diastolics less than 50 and he does feel some dizziness.  He also feels some fatigue when exerting himself a lot.  I suspect this could be due to hypotension.  Is possible also that his EF has improved now that he is back in sinus and that he may be over diuresed being on both Lasix and Aldactone.  09/15/2019  RandallMaynez returns for follow-up.  He reports improvement in his orthostatic symptoms after stopping Aldactone and his PCP also stopped tamsulosin.  He is now on some saw palmetto for BPH.  Unfortunately he does get a little bit more short of breath and I wonder if this is due to need for more diuretic.  His LVEF was 40% last year however has not been reassessed.  I would like to get an echo to recheck that.  03/25/2020  Mr. Eschbach is seen today for 35-month follow-up.  He denies any further orthostatic symptoms.  He was not feeling well on Aldactone.  He was also taken off tamsulosin.  He is only on carvedilol for heart failure and unfortunately his LVEF declined further in March down to 5 to 40%, previously 40 to 45%.  I had increased his Lasix due to signs of increased LV filling pressure however then he became a little bit prerenal.  His Lasix was then decreased to 40 mg every morning and 20 mg every afternoon.  Since then he has been asymptomatic with heart failure at most with NYHA class II symptoms.  Is been struggling with issues with the CPAP and that the humidifier broke.  This is a  [employer] device and he was told by the company that they are no longer manufacturing them rather they are making ventilators because of COVID-19.  Lipid profile in June 2021 showed total cholesterol 168, HDL 32, triglycerides 106 and 116.  A1c was 7.7 and both remain above goal.  09/20/2020  Mr. Mamone returns today for follow-up.  Overall he seems to be doing pretty well.  He struggled with a small wound under his left fifth metatarsal.  He is going to the wound center on April 1.  EF had declined somewhat down to 40 to 45%.  Unfortunately has been intolerant of Entresto and losartan more recently causing worsening fatigue.  His options for heart failure medications have been limited.  He is a diabetic with recent A1c at 6.8.  We discussed today the possibility of adding an SGLT2 inhibitor based on positive data in heart failure and diabetes.  01/01/2021  Mr. Cazarez is seen today in follow-up.  He seems somewhat desponded today.  He says he has a lot going on and and unfortunately has been dealing with wound that is nonhealing in his left leg.  He said that even working with the wound center he has developed osteomyelitis.  There talk about possible amputation.  He has been noted  to have some mild reduction in blood flow to that leg.  He is scheduled to actually have a retrograde angiogram tomorrow with [name].  He is also going to see Dr. Baxter Flattery with infectious diseases.  Hopefully they will be able to get control of this before having to consider an amputation.  He does say that he feels better though.  He had come off of alfuzosin which she was taking by his urologist and he now reports being less dizzy.  Also his blood sugars are significantly lower after starting the Jardiance.  PMHx:  Past Medical History:  Diagnosis Date   Arthritis    Knee L - probably   Atrial fibrillation (HCC)    BPH (benign prostatic hyperplasia)    CAD (coronary artery disease) 07/06/2011   CHF (congestive heart failure)  (HCC)    Coronary artery disease    Diabetes mellitus    Frequency of urination    Heart failure (HCC)    Heart murmur    High cholesterol    History of nuclear stress test 09/2010   dipyridamole; mild perfusion defect due to attenuation with mild superimposed ischemia in apical septal, apical, apical inferior & apical lateral regions; rest LV enlarged in size; prominent gut uptake in infero-apical region; no significant ischemia demonstrated; low risk scan    HNP (herniated nucleus pulposus), lumbar    Ischemic cardiomyopathy    LV dysfunction 07/06/2011   Nocturia    Right bundle branch block    S/P CABG (coronary artery bypass graft) 08/03/2011   x3; LIMA to LAD,, SVG to PDA, SVG to posterolateral branch of RCA; Dr. Ellison Hughs   Sleep apnea    sleep study 10/2010- AHI during total sleep 32.1/hr and during REM 62.3/hr (severe sleep apnea)unable to tolerate c pap   Spinal stenosis of lumbar region    Stroke [facility])    [place]     Past Surgical History:  Procedure Laterality Date   ABDOMINAL AORTOGRAM W/LOWER EXTREMITY Left 11/14/2020   Procedure: ABDOMINAL AORTOGRAM W/LOWER EXTREMITY;  Surgeon: [name], MD;  Location: [place] [facility];  Service: Cardiovascular;  Laterality: Left;   CARDIAC CATHETERIZATION  01/2011   ischemic cardiomyopathy 30-35%, multivessel CAD (Dr. [name])    CARDIAC CATHETERIZATION  09/13/2015   Procedure: Right/Left Heart Cath and Coronary/Graft Angiography;  Surgeon: [name], MD;  Location: [place] [facility];  Service: Cardiovascular;;   CARDIOVERSION N/A 05/16/2019   Procedure: CARDIOVERSION;  Surgeon: [name], MD;  Location: [place];  Service: Cardiovascular;  Laterality: N/A;   Colonscopy     CORONARY ARTERY BYPASS GRAFT  08/03/2011   Procedure: CORONARY ARTERY BYPASS GRAFTING (CABG);  Surgeon: [name], MD;  Location: [place];  Service: Open Heart Surgery;  Laterality: N/A;  CABG times three using right saphenous vein and  left mammary artery usisng endoscope.   LEFT HEART CATHETERIZATION WITH CORONARY/GRAFT ANGIOGRAM N/A 09/20/2013   Procedure: LEFT HEART CATHETERIZATION WITH [name];  Surgeon: [name], MD;  Location: [facility] C[facility];  Service: Cardiovascular;  Laterality: N/A;   Lower Extremity Arterial Doppler  03/16/2013   bilat ABIs demonstated normal values; R runoff - posterior tibial & anterior tibial arteries occluded; L runoff - peroneal & posterior tibial arteries occluded, anterior tibial artery appears occluded   LUMBAR LAMINECTOMY/DECOMPRESSION MICRODISCECTOMY N/A 09/12/2014   Procedure: LUMBAR DECOMPRESSION L3-L4, L4-L5, MICRODISCECTOMY L4-L5;  Surgeon: [name], MD;  Location: [facility];  Service: Orthopedics;  Laterality:  N/A;   PERIPHERAL VASCULAR BALLOON ANGIOPLASTY  11/14/2020   Procedure: PERIPHERAL VASCULAR BALLOON ANGIOPLASTY;  Surgeon: [name], MD;  Location: [place] [facility];  Service: Cardiovascular;;   Pilonidal Cyst removed     TRANSTHORACIC ECHOCARDIOGRAM  11/10/2012   EF 40-45%, mild LVH, mild hypokinesis of anteroseptal myocardium, grade 1 diastolic dysfunction; mild MR & calcifed mitral annulus; LA mild-mod dilated; RA mildly dilated    FAMHx:  Family History  Problem Relation Age of Onset   Anesthesia problems Daughter    Cancer Mother    Lung disease Father        & heart disease   Colon cancer Sister    Sudden death Maternal Grandmother     SOCHx:   reports that he quit smoking about 9 years ago. His smoking use included cigars. He quit smokeless tobacco use about 8 years ago. He reports current alcohol use. He reports that he does not use drugs.  ALLERGIES:  Allergies  Allergen Reactions   Entresto [Sacubitril-Valsartan] Other (See Comments)    dizziness   Sacubitril Other (See Comments)     Dizziness    ROS: Pertinent items noted in HPI and remainder of comprehensive ROS otherwise negative.  HOME MEDS: Current Outpatient  Medications  Medication Sig Dispense Refill   aspirin EC 81 MG tablet Take 81 mg by mouth at bedtime.      carvedilol (COREG) 6.25 MG tablet Take 1 tablet (6.25 mg total) by mouth 2 (two) times daily. 180 tablet 3   empagliflozin (JARDIANCE) 10 MG TABS tablet Take 1 tablet (10 mg total) by mouth daily before breakfast. 30 tablet 11   ertapenem (INVANZ) IVPB Inject 1 g into the vein daily.     furosemide (LASIX) 40 MG tablet Take 40 mg by mouth in the morning.     gabapentin (NEURONTIN) 300 MG capsule Take 600 mg by mouth at bedtime.     glipiZIDE (GLUCOTROL XL) 10 MG 24 hr tablet Take 10 mg by mouth at bedtime.     metFORMIN (GLUCOPHAGE-XR) 500 MG 24 hr tablet Take 1,000 mg by mouth 2 (two) times daily.     ONETOUCH VERIO test strip 1 each daily.     rosuvastatin (CRESTOR) 20 MG tablet Take 10 mg by mouth every other day. In the evening     ELIQUIS 5 MG TABS tablet Take 1 tablet by mouth twice daily (Patient not taking: Reported on 01/01/2021) 180 tablet 1   No current facility-administered medications for this visit.    LABS/IMAGING: No results found for this or any previous visit (from the past 48 hour(s)).  No results found.  VITALS: BP (!) 120/58 (BP Location: Left Arm, Patient Position: Sitting, Cuff Size: Normal)   Pulse 61   Ht 6\' 2"  (1.88 m)   Wt 231 lb 6.4 oz (105 kg)   SpO2 98%   BMI 29.71 kg/m   EXAM: General appearance: alert and no distress Neck: no carotid bruit, no JVD and thyroid not enlarged, symmetric, no tenderness/mass/nodules Lungs: clear to auscultation bilaterally Heart: irregularly irregular rhythm Abdomen: soft, non-tender; bowel sounds normal; no masses,  no organomegaly Extremities: extremities normal, atraumatic, no cyanosis or edema Pulses: 2+ and symmetric Skin: Skin color, texture, turgor normal. No rashes or lesions Neurologic: Grossly normal Psych: Pleasant  EKG: Sinus rhythm first-degree AV block, bifascicular block at 61-personally  reviewed-personally reviewed  ASSESSMENT: Orthostatic hypotension-resolved New onset atrial fibrillation, CHADSVASC score of 5, status post DCCV (72/6203) Chronic systolic congestive  heart failure - EF 40% (07/2017) Coronary artery disease status post three-vessel CABG in 2013 (LIMA to LAD, SVG to PDA and SVG to PLA) - patent grafts by cath in 08/2015 Hypertension-controlled Dyslipidemia Diabetes type 2 - better controlled Mild PVD-with normal ABI Hypotension/upper back pain - resolved Spinal stenosis - s/p surgery Trifascicular block OSA-back on BiPAP  PLAN: 1.   Mr. Savoca denies any chest pain or shortness of breath.  He says his dizziness has improved.  His A1c is coming down and is fasting blood sugars are around 90-100 which is much improved from around 140 previously.  He seems to be doing well on Jardiance.  Hopefully this will lead to some improvement in LVEF down the road.  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  Hopefully this could be treated without the need for amputation.  Of note, his LDL was not at target which was 127 in April, should be less than 70.  He reports taking his rosuvastatin only a few times a week.  While we could consider adding something at this point, I would like for him to see if he can get over his issues with osteomyelitis first and will readdress this down the road.  Follow-up with me in 6 months or sooner as necessary.  [name], MD, [facility], [place] Director of the [facility] &  Cardiovascular [facility] Clinic Diplomate of the American Board of Clinical Lipidology Attending Cardiologist  Direct Dial: [phone]  Fax: [phone]  Website:  [email or web address] Brenn Deziel 01/01/2021, 9:03 AM

## 2021-01-01 NOTE — Patient Instructions (Signed)

## 2021-01-02 ENCOUNTER — Telehealth: Payer: Self-pay | Admitting: *Deleted

## 2021-01-02 ENCOUNTER — Other Ambulatory Visit: Payer: Self-pay

## 2021-01-02 ENCOUNTER — Ambulatory Visit (HOSPITAL_COMMUNITY)
Admission: RE | Admit: 2021-01-02 | Discharge: 2021-01-02 | Disposition: A | Discharge status: Home / Self Care | Payer: Medicare Other | Attending: Vascular Surgery | Admitting: Vascular Surgery

## 2021-01-02 ENCOUNTER — Encounter (HOSPITAL_COMMUNITY)
Admission: RE | Disposition: A | Discharge status: Home / Self Care | Payer: Self-pay | Source: Home / Self Care | Attending: Vascular Surgery

## 2021-01-02 DIAGNOSIS — I998 Other disorder of circulatory system: Secondary | ICD-10-CM | POA: Diagnosis not present

## 2021-01-02 DIAGNOSIS — I4891 Unspecified atrial fibrillation: Secondary | ICD-10-CM | POA: Insufficient documentation

## 2021-01-02 DIAGNOSIS — E1151 Type 2 diabetes mellitus with diabetic peripheral angiopathy without gangrene: Secondary | ICD-10-CM | POA: Insufficient documentation

## 2021-01-02 DIAGNOSIS — L97529 Non-pressure chronic ulcer of other part of left foot with unspecified severity: Secondary | ICD-10-CM | POA: Diagnosis not present

## 2021-01-02 DIAGNOSIS — I70245 Atherosclerosis of native arteries of left leg with ulceration of other part of foot: Secondary | ICD-10-CM | POA: Diagnosis not present

## 2021-01-02 DIAGNOSIS — Z7984 Long term (current) use of oral hypoglycemic drugs: Secondary | ICD-10-CM | POA: Diagnosis not present

## 2021-01-02 DIAGNOSIS — I509 Heart failure, unspecified: Secondary | ICD-10-CM | POA: Insufficient documentation

## 2021-01-02 DIAGNOSIS — I70222 Atherosclerosis of native arteries of extremities with rest pain, left leg: Secondary | ICD-10-CM | POA: Insufficient documentation

## 2021-01-02 DIAGNOSIS — Z951 Presence of aortocoronary bypass graft: Secondary | ICD-10-CM | POA: Insufficient documentation

## 2021-01-02 DIAGNOSIS — I2510 Atherosclerotic heart disease of native coronary artery without angina pectoris: Secondary | ICD-10-CM | POA: Diagnosis not present

## 2021-01-02 DIAGNOSIS — Z87891 Personal history of nicotine dependence: Secondary | ICD-10-CM | POA: Diagnosis not present

## 2021-01-02 DIAGNOSIS — Z79899 Other long term (current) drug therapy: Secondary | ICD-10-CM | POA: Insufficient documentation

## 2021-01-02 DIAGNOSIS — E11621 Type 2 diabetes mellitus with foot ulcer: Secondary | ICD-10-CM | POA: Insufficient documentation

## 2021-01-02 DIAGNOSIS — Z7982 Long term (current) use of aspirin: Secondary | ICD-10-CM | POA: Insufficient documentation

## 2021-01-02 DIAGNOSIS — Z7901 Long term (current) use of anticoagulants: Secondary | ICD-10-CM | POA: Insufficient documentation

## 2021-01-02 DIAGNOSIS — Z888 Allergy status to other drugs, medicaments and biological substances status: Secondary | ICD-10-CM | POA: Insufficient documentation

## 2021-01-02 HISTORY — PX: ABDOMINAL AORTOGRAM W/LOWER EXTREMITY: CATH118223

## 2021-01-02 HISTORY — PX: PERIPHERAL VASCULAR BALLOON ANGIOPLASTY: CATH118281

## 2021-01-02 LAB — POCT I-STAT, CHEM 8
BUN: 24 mg/dL — ABNORMAL HIGH (ref 8–23)
Calcium, Ion: 1.17 mmol/L (ref 1.15–1.40)
Chloride: 105 mmol/L (ref 98–111)
Creatinine, Ser: 1 mg/dL (ref 0.61–1.24)
Glucose, Bld: 127 mg/dL — ABNORMAL HIGH (ref 70–99)
HCT: 32 % — ABNORMAL LOW (ref 39.0–52.0)
Hemoglobin: 10.9 g/dL — ABNORMAL LOW (ref 13.0–17.0)
Potassium: 3.8 mmol/L (ref 3.5–5.1)
Sodium: 142 mmol/L (ref 135–145)
TCO2: 25 mmol/L (ref 22–32)

## 2021-01-02 LAB — GLUCOSE, CAPILLARY: Glucose-Capillary: 106 mg/dL — ABNORMAL HIGH (ref 70–99)

## 2021-01-02 SURGERY — ABDOMINAL AORTOGRAM W/LOWER EXTREMITY
Anesthesia: LOCAL

## 2021-01-02 MED ORDER — HYDRALAZINE HCL 20 MG/ML IJ SOLN: 5.0000 mg | INTRAMUSCULAR | Status: DC | PRN

## 2021-01-02 MED ORDER — IODIXANOL 320 MG/ML IV SOLN
INTRAVENOUS | Status: DC | PRN
  Administered 2021-01-02: 30 mL via INTRA_ARTERIAL

## 2021-01-02 MED ORDER — ACETAMINOPHEN 325 MG PO TABS: 650.0000 mg | ORAL_TABLET | ORAL | Status: DC | PRN

## 2021-01-02 MED ORDER — HEPARIN SODIUM (PORCINE) 1000 UNIT/ML IJ SOLN
INTRAMUSCULAR | Status: DC | PRN
  Administered 2021-01-02: 10000 [IU] via INTRAVENOUS

## 2021-01-02 MED ORDER — HEPARIN SODIUM (PORCINE) 1000 UNIT/ML IJ SOLN
INTRAMUSCULAR | Status: AC
  Filled 2021-01-02: qty 1

## 2021-01-02 MED ORDER — LIDOCAINE HCL (PF) 1 % IJ SOLN
INTRAMUSCULAR | Status: DC | PRN
  Administered 2021-01-02: 20 mL via INTRADERMAL

## 2021-01-02 MED ORDER — FENTANYL CITRATE (PF) 100 MCG/2ML IJ SOLN
INTRAMUSCULAR | Status: AC
  Filled 2021-01-02: qty 2

## 2021-01-02 MED ORDER — FENTANYL CITRATE (PF) 100 MCG/2ML IJ SOLN
INTRAMUSCULAR | Status: DC | PRN
  Administered 2021-01-02: 50 ug via INTRAVENOUS

## 2021-01-02 MED ORDER — MIDAZOLAM HCL 2 MG/2ML IJ SOLN
INTRAMUSCULAR | Status: AC
  Filled 2021-01-02: qty 2

## 2021-01-02 MED ORDER — SODIUM CHLORIDE 0.9% FLUSH: 3.0000 mL | Freq: Two times a day (BID) | INTRAVENOUS | Status: DC

## 2021-01-02 MED ORDER — SODIUM CHLORIDE 0.9 % IV SOLN: INTRAVENOUS | Status: DC

## 2021-01-02 MED ORDER — ONDANSETRON HCL 4 MG/2ML IJ SOLN: 4.0000 mg | Freq: Four times a day (QID) | INTRAMUSCULAR | Status: DC | PRN

## 2021-01-02 MED ORDER — MIDAZOLAM HCL 2 MG/2ML IJ SOLN
INTRAMUSCULAR | Status: DC | PRN
  Administered 2021-01-02: 1 mg via INTRAVENOUS

## 2021-01-02 MED ORDER — LABETALOL HCL 5 MG/ML IV SOLN: 10.0000 mg | INTRAVENOUS | Status: DC | PRN

## 2021-01-02 MED ORDER — SODIUM CHLORIDE 0.9% FLUSH: 3.0000 mL | INTRAVENOUS | Status: DC | PRN

## 2021-01-02 MED ORDER — SODIUM CHLORIDE 0.9 % IV SOLN: 250.0000 mL | INTRAVENOUS | Status: DC | PRN

## 2021-01-02 MED ORDER — HEPARIN (PORCINE) IN NACL 1000-0.9 UT/500ML-% IV SOLN
INTRAVENOUS | Status: DC | PRN
  Administered 2021-01-02 (×2): 500 mL

## 2021-01-02 MED ORDER — HEPARIN (PORCINE) IN NACL 1000-0.9 UT/500ML-% IV SOLN
INTRAVENOUS | Status: AC
  Filled 2021-01-02: qty 1000

## 2021-01-02 MED ORDER — LIDOCAINE HCL (PF) 1 % IJ SOLN
INTRAMUSCULAR | Status: AC
  Filled 2021-01-02: qty 30

## 2021-01-02 SURGICAL SUPPLY — 11 items
CATH CXI 2.6F 65 ST (CATHETERS) ×1
CATH SPRT STRG 65X2.6FR ACPT (CATHETERS) ×2 IMPLANT
DEVICE ONE SNARE 10MM (MISCELLANEOUS) ×3 IMPLANT
KIT PV (KITS) ×3 IMPLANT
SHEATH GLIDE SLENDER 4/5FR (SHEATH) ×3 IMPLANT
SHEATH MICROPUNCTURE PEDAL 4FR (SHEATH) ×3 IMPLANT
SHEATH PROBE COVER 6X72 (BAG) ×3 IMPLANT
TRANSDUCER W/STOPCOCK (MISCELLANEOUS) ×3 IMPLANT
TRAY PV CATH (CUSTOM PROCEDURE TRAY) ×3 IMPLANT
WIRE G V18X300CM (WIRE) ×6 IMPLANT
WIRE SPARTACORE .014X300CM (WIRE) ×3 IMPLANT

## 2021-01-02 NOTE — Telephone Encounter (Signed)
Pts daughter called to report patient uncontrollably shivering after angiogram this morning. Daughter does not think the patient has a fever but is on the way to the patient now and will check his temperature when she gets there. I consulted Dr. Scot Dock and he doesn't think that the shivering is related to the procedure. Instructed pt to go to the ED if the shivering doesn't get better or if the patient has a fever. Daughter verbalized understanding.

## 2021-01-02 NOTE — Progress Notes (Addendum)
Pt ambulated without difficulty or bleeding.   Discharged home with his friend who will drive and stay with pt x 24 hrs.

## 2021-01-02 NOTE — Op Note (Signed)
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  Ultrasound-guided access of the left anterior tibial artery retrograde at the ankle 2.  Unsuccessful attempt at retrograde recanalization of the left anterior tibial artery  3.  50 minutes monitored moderate conscious sedation time  Indications: Patient is a 75 year old male seen with left lower extremity tissue loss in the setting of tibial disease.  Previously underwent left lower extremity arteriogram that showed single-vessel runoff in the peroneal artery and we attempted antegrade recanalization of the anterior tibial that was unsuccessful.  He presents today for attempted retrograde recanalization of the anterior tibial after risk benefits discussed.  Findings:   Aortogram and left lower extremity arteriogram were deferred given they were recently performed.  The left anterior tibial artery was accessed under ultrasound retrograde at the ankle with placement of a 4 French pedal sheath.  I then used a V 18 wire with a CXI catheter to try and cross the anterior tibial occlusion retrograde.  I was unsuccessful and downsized to an 014 system with a Sparta core wire and the tip of the wire broke off in the mid anterior tibial artery and became endo trash.  I was unable to snare this even after an attempted snare.  This artery is occluded and there is no antegrade flow so I think there should be no significant risk.  Ultimately the sheath was removed after unsuccessful attempt and he still has a brisk dorsalis pedis signal and filling of the peroneal antegrade.   Procedure:  The patient was identified in the holding area and taken to room 8.  The patient was then placed supine on the table and prepped and draped in the usual sterile  fashion.  A time out was called.  Ultrasound was used to evaluate the anterior tibial artery at the left ankle and it was patent and image was saved.  This was accessed retrograde under ultrasound guidance and placed a needle and then a microwire and Cook micro sheath.  The patient was given 100 units/kg IV heparin.  I then used a CXI catheter with a V 18 to come retrograde.  I was only able advance my wire about 2 cm before I met resistance.  Ultimately the artery was occluded here as previously noted and heavily calcified.  I tried to come retrograde with a V 18 and the CXI catheter unsuccessful.  I then tried to downsized to a smaller system with an 014 wire and used a Sparta core coming retrograde.  The soft tip of the wire in the process of coming through the calcified broke off.  I did upsize to a 4-5 slender sheath and used a small snare and tried to snare the broke off wire tip but was unsuccessful.  This was an occluded artery likely in a subintimal plane and I do not think it will be of significant consequence given there is no antegrade flow in the artery and it is occluded.  Ultimately after several additional attempts to come retrograde unsuccessful I elected to stop.  I did final retrograde injection and it did show preserved runoff in the peroneal and he had a brisk dorsalis pedis signal in the foot.  The sheath was removed pressure was held.  Complication: 035 Sparta core wire tip broke in the mid left anterior  tibial during attempted retrograde crossing of the chronically occluded artery.  This became endo trash and we were unable to snare it.  Condition: Stable   Maryruth Eve, MD [facility] Office: [phone]

## 2021-01-02 NOTE — H&P (Signed)
History and Physical Interval Note:  01/02/2021 8:39 AM  [name] [name]  has presented today for surgery, with the diagnosis of PAD.  The various methods of treatment have been discussed with the patient and family. After consideration of risks, benefits and other options for treatment, the patient has consented to  Procedure(s): ABDOMINAL AORTOGRAM W/LOWER EXTREMITY (N/A) as a surgical intervention.  The patient's history has been reviewed, patient examined, no change in status, stable for surgery.  I have reviewed the patient's chart and labs.  Questions were answered to the patient's satisfaction.    Worsening left foot wound and has elected to undergo attempt at retrograde tibial intervention  [name] [name]  Patient name: [name] [name]  MRN: [id number]        DOB: 1945-12-08        Sex: male   REASON FOR CONSULT: 1 month follow-up left foot wound   HPI: [name] [name] is a 75 y.o. male, multiple medical problems including diabetes, atrial fibrillation, coronary artery disease status post CABG, heart failure that presents for 1 month follow-up of nonhealing left foot wound.  Patient states the wound started in January of this year.  Ultimately he started going to the wound clinic at the beginning of April and sees Dr. Dellia Nims.     He underwent angiogram with me on 11/14/2020 and was found to have severe tibial disease with only peroneal runoff.  I did attempt antegrade recanalization of the AT.  Discussed if wound gets worse would consider attempted retrograde left anterior tibial intervention in the future if his wound fails to make progress.  He feels the wounds is about the same.  He has gotten a PICC and started IV antibiotics.       Past Medical History:  Diagnosis Date   Arthritis      Knee L - probably   Atrial fibrillation (HCC)     BPH (benign prostatic hyperplasia)     CAD (coronary artery disease) 07/06/2011   CHF (congestive heart failure) (HCC)     Coronary artery  disease     Diabetes mellitus     Frequency of urination     Heart failure (HCC)     Heart murmur     High cholesterol     History of nuclear stress test 09/2010    dipyridamole; mild perfusion defect due to attenuation with mild superimposed ischemia in apical septal, apical, apical inferior & apical lateral regions; rest LV enlarged in size; prominent gut uptake in infero-apical region; no significant ischemia demonstrated; low risk scan   HNP (herniated nucleus pulposus), lumbar     Ischemic cardiomyopathy     LV dysfunction 07/06/2011   Nocturia     Right bundle branch block     S/P CABG (coronary artery bypass graft) 08/03/2011    x3; LIMA to LAD,, SVG to PDA, SVG to posterolateral branch of RCA; Dr. Ellison Hughs   Sleep apnea      sleep study 10/2010- AHI during total sleep 32.1/hr and during REM 62.3/hr (severe sleep apnea)unable to tolerate c pap   Spinal stenosis of lumbar region     Stroke [facility])      [place]            Past Surgical History:  Procedure Laterality Date   ABDOMINAL AORTOGRAM W/LOWER EXTREMITY Left 11/14/2020    Procedure: ABDOMINAL AORTOGRAM W/LOWER EXTREMITY;  Surgeon: [name] Heck, MD;  Location: [place] [facility];  Service: Cardiovascular;  Laterality: Left;   CARDIAC CATHETERIZATION   01/2011    ischemic cardiomyopathy 30-35%, multivessel CAD (Dr. [name])   CARDIAC CATHETERIZATION   09/13/2015    Procedure: Right/Left Heart Cath and Coronary/Graft Angiography;  Surgeon: [name], MD;  Location: [place] [facility];  Service: Cardiovascular;;   CARDIOVERSION N/A 05/16/2019    Procedure: CARDIOVERSION;  Surgeon: [name], MD;  Location: [place];  Service: Cardiovascular;  Laterality: N/A;   Colonscopy       CORONARY ARTERY BYPASS GRAFT   08/03/2011    Procedure: CORONARY ARTERY BYPASS GRAFTING (CABG);  Surgeon: [name], MD;  Location: [place];  Service: Open Heart Surgery;  Laterality: N/A;  CABG times three using right saphenous  vein and left mammary artery usisng endoscope.   LEFT HEART CATHETERIZATION WITH CORONARY/GRAFT ANGIOGRAM N/A 09/20/2013    Procedure: LEFT HEART CATHETERIZATION WITH [name];  Surgeon: [name], MD;  Location[facility];  Service: Cardiovascular;  Laterality: N/A;   Lower Extremity Arterial Doppler   03/16/2013    bilat ABIs demonstated normal values; R runoff - posterior tibial & anterior tibial arteries occluded; L runoff - peroneal & posterior tibial arteries occluded, anterior tibial artery appears occluded   LUMBAR LAMINECTOMY/DECOMPRESSION MICRODISCECTOMY N/A 09/12/2014    Procedure: LUMBAR DECOMPRESSION L3-L4, L4-L5, MICRODISCECTOMY L4-L5;  Surgeon: [name], MD;  Location: [facility];  Service: Orthopedics;  Laterality: N/A;   PERIPHERAL VASCULAR BALLOON ANGIOPLASTY   11/14/2020    Procedure: PERIPHERAL VASCULAR BALLOON ANGIOPLASTY;  Surgeon: [name] Heck, MD;  Location: [place] [facility];  Service: Cardiovascular;;   Pilonidal Cyst removed       TRANSTHORACIC ECHOCARDIOGRAM   11/10/2012    EF 40-45%, mild LVH, mild hypokinesis of anteroseptal myocardium, grade 1 diastolic dysfunction; mild MR & calcifed mitral annulus; LA mild-mod dilated; RA mildly dilated           Family History  Problem Relation Age of Onset   Anesthesia problems Daughter     Cancer Mother     Lung disease Father          & heart disease   Colon cancer Sister     Sudden death Maternal Grandmother        SOCIAL HISTORY: Social History         Socioeconomic History   Marital status: Widowed      Spouse name: Not on file   Number of children: 1   Years of education: Not on file   Highest education level: Not on file  Occupational History   Occupation: real Investment banker, corporate  Tobacco Use   Smoking status: Former      Pack years: 0.00      Types: Cigars      Quit date: 09/07/2011      Years since quitting: 9.2   Smokeless tobacco: Former      Quit date: 02/28/2012   Substance and Sexual Activity   Alcohol use: Yes      Alcohol/week: 0.0 standard drinks      Comment: occasional   Drug use: No   Sexual activity: Not on file  Other Topics Concern   Not on file  Social History Narrative   Not on file    Social Determinants of Health    Financial Resource Strain: Not on file  Food Insecurity: Not on file  Transportation Needs: Not on file  Physical Activity: Not on file  Stress: Not on file  Social Connections: Not on file  Intimate Partner Violence: Not on file           Allergies  Allergen Reactions   Entresto [Sacubitril-Valsartan] Other (See Comments)      dizziness   Sacubitril Other (See Comments)       Dizziness            Current Outpatient Medications  Medication Sig Dispense Refill   aspirin EC 81 MG tablet Take 81 mg by mouth at bedtime.       carvedilol (COREG) 6.25 MG tablet Take 1 tablet (6.25 mg total) by mouth 2 (two) times daily. 180 tablet 3   ELIQUIS 5 MG TABS tablet Take 1 tablet by mouth twice daily (Patient taking differently: Take 5 mg by mouth in the morning and at bedtime.) 180 tablet 1   empagliflozin (JARDIANCE) 10 MG TABS tablet Take 1 tablet (10 mg total) by mouth daily before breakfast. 30 tablet 11   furosemide (LASIX) 40 MG tablet Take 40 mg by mouth in the morning.       gabapentin (NEURONTIN) 300 MG capsule Take 600 mg by mouth at bedtime.       glipiZIDE (GLUCOTROL XL) 10 MG 24 hr tablet Take 10 mg by mouth at bedtime.       metFORMIN (GLUCOPHAGE-XR) 500 MG 24 hr tablet Take 1,000 mg by mouth 2 (two) times daily.       ONETOUCH VERIO test strip 1 each daily.       rosuvastatin (CRESTOR) 20 MG tablet Take 10 mg by mouth every other day. In the evening       sulfamethoxazole-trimethoprim (BACTRIM DS) 800-160 MG tablet Take 1 tablet by mouth daily. 30 tablet 0   alfuzosin (UROXATRAL) 10 MG 24 hr tablet Take 1 tablet (10 mg total) by mouth daily with breakfast. (Patient not taking: Reported on 12/17/2020)  30 tablet 11   losartan (COZAAR) 25 MG tablet Take 1 tablet (25 mg total) by mouth daily. (Patient not taking: Reported on 11/11/2020) 90 tablet 3   tadalafil (CIALIS) 20 MG tablet Take 1 tablet (20 mg total) by mouth as needed. (Patient not taking: Reported on 11/11/2020) 20 tablet 5    No current facility-administered medications for this visit.             Facility-Administered Medications Ordered in Other Visits  Medication Dose Route Frequency Provider Last Rate Last Admin   heparin lock flush 100 unit/mL  250 Units Intracatheter Daily Carlyle Basques, MD        And   heparin lock flush 100 unit/mL  250 Units Intracatheter PRN Carlyle Basques, MD   250 Units at 12/17/20 1111   lidocaine (XYLOCAINE) 1 % (with pres) injection                  REVIEW OF SYSTEMS:  [X]  denotes positive finding, [ ]  denotes negative finding Cardiac   Comments:  Chest pain or chest pressure:      Shortness of breath upon exertion:      Short of breath when lying flat:      Irregular heart rhythm:             Vascular      Pain in calf, thigh, or hip brought on by ambulation:      Pain in feet at night that wakes you up from your sleep:      Blood clot in your veins:      Leg swelling:  Pulmonary      Oxygen at home:      Productive cough:      Wheezing:             Neurologic      Sudden weakness in arms or legs:      Sudden numbness in arms or legs:      Sudden onset of difficulty speaking or slurred speech:      Temporary loss of vision in one eye:      Problems with dizziness:             Gastrointestinal      Blood in stool:      Vomited blood:             Genitourinary      Burning when urinating:      Blood in urine:             Psychiatric      Major depression:             Hematologic      Bleeding problems:      Problems with blood clotting too easily:             Skin      Rashes or ulcers:             Constitutional      Fever or chills:           PHYSICAL EXAM:    Vitals:    12/17/20 1555  BP: 119/61  Pulse: 66  Resp: 18  Temp: 97.6 F (36.4 C)  TempSrc: Temporal  SpO2: 98%  Weight: 227 lb (103 kg)  Height: 6\' 2"  (1.88 m)      GENERAL: The patient is a well-nourished male, in no acute distress. The vital signs are documented above. CARDIAC: There is a regular rate and rhythm. VASCULAR:  Palpable femoral pulses bilateral Palpable popliteal pulses bilaterally No palpable pedal pulses Left 5th metatarsal diabetic ulcer PULMONARY: No respiratory distress. NEUROLOGIC: No focal weakness or paresthesias are detected. PSYCHIATRIC: The patient has a normal affect.            DATA:    ABIs on the left 0.99 but monophasic and severely reduced toe pressure 32.   Assessment/Plan:   75 year old male with multiple medical comorbidities as noted above that presents for one month follow-up of a nonhealing left foot.  He has since had a PICC line placed and started antibiotics through the wound clinic and also has been talking about hyperbaric oxygen.  His daughter asked about the ability to heal toe amputation/debridement and I discussed my concern given his toe pressure of 32 I think he would be high risk for non-healing.  We discussed the other option would be a retrograde anterior tibial intervention as I previously discussed with the family.  He does have inline flow down the peroneal but if the wound continues to get worse this would be the only other option.  They had a number of questions today and I tried to answer all of these.  Ultimately I think we agreed that he will continue to see how the wound improves with IV antibiotics and continue wound care and potential hyperbaric oxygen over the next month.  I will plan to see him in 1 month.  He may reconsider retrograde intervention at anytime if the wound starts to look worse.     [name] Heck, MD [facility]  Office: [phone]

## 2021-01-02 NOTE — Discharge Instructions (Signed)
Tibial Site Care This sheet gives you information about how to care for yourself after your procedure. Your health care provider may also give you more specific instructions. If you have problems or questions, contact your health care provider. What can I expect after the procedure?  After the procedure, it is common to have: Bruising that usually fades within 1-2 weeks. Tenderness at the site. Follow these instructions at home: Wound care Follow instructions from your health care provider about how to take care of your insertion site. Make sure you: Wash your hands with soap and water before you change your bandage (dressing). If soap and water are not available, use hand sanitizer. Remove your dressing as told by your health care provider. Do not take baths, swim, or use a hot tub until your health care provider approves. You may shower 24-48 hours after the procedure or as told by your health care provider. Gently wash the site with plain soap and water. Pat the area dry with a clean towel. Do not rub the site. This may cause bleeding. Do not apply powder or lotion to the site. Keep the site clean and dry. Check your femoral site every day for signs of infection. Check for: Redness, swelling, or pain. Fluid or blood. Warmth. Pus or a bad smell. Activity For the first 2-3 days after your procedure, or as long as directed: Avoid climbing stairs as much as possible. Do not squat. Do not lift anything that is heavier than 10 lb (4.5 kg), or the limit that you are told, until your health care provider says that it is safe. For 5 days Rest as directed. Avoid sitting for a long time without moving. Get up to take short walks every 1-2 hours. Do not drive for 24 hours if you were given a medicine to help you relax (sedative). General instructions Take over-the-counter and prescription medicines only as told by your health care provider. Keep all follow-up visits as told by your health  care provider. This is important. Contact a health care provider if you have: A fever or chills. You have redness, swelling, or pain around your insertion site. Get help right away if: The catheter insertion area swells very fast. You pass out. You suddenly start to sweat or your skin gets clammy. The catheter insertion area is bleeding, and the bleeding does not stop when you hold steady pressure on the area. The area near or just beyond the catheter insertion site becomes pale, cool, tingly, or numb. These symptoms may represent a serious problem that is an emergency. Do not wait to see if the symptoms will go away. Get medical help right away. Call your local emergency services (911 in the [place] Do not drive yourself to the hospital. Summary After the procedure, it is common to have bruising that usually fades within 1-2 weeks. Check your femoral site every day for signs of infection. Do not lift anything that is heavier than 10 lb (4.5 kg), or the limit that you are told, until your health care provider says that it is safe. This information is not intended to replace advice given to you by your health care provider. Make sure you discuss any questions you have with your health care provider. Document Revised: 06/28/2017 Document Reviewed: 06/28/2017 Elsevier Patient Education  2020 [employer].

## 2021-01-03 ENCOUNTER — Encounter (HOSPITAL_COMMUNITY): Payer: Self-pay | Admitting: Vascular Surgery

## 2021-01-06 ENCOUNTER — Telehealth: Payer: Self-pay

## 2021-01-06 ENCOUNTER — Ambulatory Visit (INDEPENDENT_AMBULATORY_CARE_PROVIDER_SITE_OTHER): Payer: Medicare Other | Admitting: Internal Medicine

## 2021-01-06 ENCOUNTER — Other Ambulatory Visit: Payer: Self-pay

## 2021-01-06 VITALS — BP 114/63 | HR 57 | Temp 97.7°F | Resp 16 | Wt 230.0 lb

## 2021-01-06 DIAGNOSIS — E11621 Type 2 diabetes mellitus with foot ulcer: Secondary | ICD-10-CM

## 2021-01-06 DIAGNOSIS — E1169 Type 2 diabetes mellitus with other specified complication: Secondary | ICD-10-CM

## 2021-01-06 DIAGNOSIS — I739 Peripheral vascular disease, unspecified: Secondary | ICD-10-CM | POA: Diagnosis not present

## 2021-01-06 DIAGNOSIS — A499 Bacterial infection, unspecified: Secondary | ICD-10-CM

## 2021-01-06 DIAGNOSIS — L97509 Non-pressure chronic ulcer of other part of unspecified foot with unspecified severity: Secondary | ICD-10-CM | POA: Diagnosis not present

## 2021-01-06 DIAGNOSIS — M869 Osteomyelitis, unspecified: Secondary | ICD-10-CM | POA: Diagnosis not present

## 2021-01-06 DIAGNOSIS — I255 Ischemic cardiomyopathy: Secondary | ICD-10-CM

## 2021-01-06 NOTE — Telephone Encounter (Signed)
End date, 7/19 END DATE. Advised Ladona Ridgel from [facility] that per Dr. Baxter Flattery patient will need to extend IV /Abx by 2 weeks after 01/14/21 END DATE.

## 2021-01-07 ENCOUNTER — Encounter (HOSPITAL_BASED_OUTPATIENT_CLINIC_OR_DEPARTMENT_OTHER): Payer: Medicare Other | Attending: Internal Medicine | Admitting: Internal Medicine

## 2021-01-07 DIAGNOSIS — L97528 Non-pressure chronic ulcer of other part of left foot with other specified severity: Secondary | ICD-10-CM | POA: Insufficient documentation

## 2021-01-07 DIAGNOSIS — E1142 Type 2 diabetes mellitus with diabetic polyneuropathy: Secondary | ICD-10-CM | POA: Diagnosis not present

## 2021-01-07 DIAGNOSIS — I110 Hypertensive heart disease with heart failure: Secondary | ICD-10-CM | POA: Insufficient documentation

## 2021-01-07 DIAGNOSIS — E1151 Type 2 diabetes mellitus with diabetic peripheral angiopathy without gangrene: Secondary | ICD-10-CM | POA: Diagnosis not present

## 2021-01-07 DIAGNOSIS — E11621 Type 2 diabetes mellitus with foot ulcer: Secondary | ICD-10-CM | POA: Diagnosis not present

## 2021-01-07 DIAGNOSIS — I509 Heart failure, unspecified: Secondary | ICD-10-CM | POA: Insufficient documentation

## 2021-01-07 DIAGNOSIS — I255 Ischemic cardiomyopathy: Secondary | ICD-10-CM | POA: Insufficient documentation

## 2021-01-07 DIAGNOSIS — L97522 Non-pressure chronic ulcer of other part of left foot with fat layer exposed: Secondary | ICD-10-CM | POA: Diagnosis not present

## 2021-01-08 DIAGNOSIS — E1169 Type 2 diabetes mellitus with other specified complication: Secondary | ICD-10-CM | POA: Diagnosis not present

## 2021-01-08 DIAGNOSIS — L97528 Non-pressure chronic ulcer of other part of left foot with other specified severity: Secondary | ICD-10-CM | POA: Diagnosis not present

## 2021-01-08 DIAGNOSIS — E1142 Type 2 diabetes mellitus with diabetic polyneuropathy: Secondary | ICD-10-CM | POA: Diagnosis not present

## 2021-01-08 DIAGNOSIS — M869 Osteomyelitis, unspecified: Secondary | ICD-10-CM | POA: Diagnosis not present

## 2021-01-08 DIAGNOSIS — L03116 Cellulitis of left lower limb: Secondary | ICD-10-CM | POA: Diagnosis not present

## 2021-01-08 DIAGNOSIS — E11621 Type 2 diabetes mellitus with foot ulcer: Secondary | ICD-10-CM | POA: Diagnosis not present

## 2021-01-08 NOTE — Progress Notes (Addendum)
[name], [name] ([id number]) Visit Report for 01/07/2021 Debridement Details Patient Name: Date of Service: [name] [name], [name] [name] 01/07/2021 8:15 A M Medical Record Number: [id number] Patient Account Number: [id number] Date of Birth/Sex: Treating RN: 04-23-1946 (75 y.o. [name], [name] Primary Care Provider: Consuello Masse Other Clinician: Referring Provider: Treating Provider/Extender: Zadie Cleverly in Treatment: 14 Debridement Performed for Assessment: Wound #1 Left,Plantar Metatarsal head fifth Performed By: Physician Ricard Dillon., MD Debridement Type: Debridement Severity of Tissue Pre Debridement: Fat layer exposed Level of Consciousness (Pre-procedure): Awake and Alert Pre-procedure Verification/Time Out Yes - 08:54 Taken: Start Time: 08:54 Pain Control: Lidocaine T Area Debrided (L x W): otal 0.9 (cm) x 2.1 (cm) = 1.89 (cm) Tissue and other material debrided: Viable, Non-Viable, Slough, Subcutaneous, Tendon, Skin: Dermis , Skin: Epidermis, Slough Level: Skin/Subcutaneous Tissue/Muscle Debridement Description: Excisional Instrument: Curette, Forceps, Scissors Bleeding: Minimum Hemostasis Achieved: Pressure End Time: 08:55 Procedural Pain: 0 Post Procedural Pain: 0 Response to Treatment: Procedure was tolerated well Level of Consciousness (Post- Awake and Alert procedure): Post Debridement Measurements of Total Wound Length: (cm) 0.9 Width: (cm) 2.1 Depth: (cm) 1.1 Volume: (cm) 1.633 Character of Wound/Ulcer Post Debridement: Improved Severity of Tissue Post Debridement: Fat layer exposed Post Procedure Diagnosis Same as Pre-procedure Electronic Signature(s) Signed: 01/07/2021 5:25:56 PM By: Linton Ham MD Signed: 01/08/2021 6:12:45 PM By: Rhae Hammock RN Entered By: Linton Ham on 01/07/2021 09:23:10 -------------------------------------------------------------------------------- HPI Details Patient Name: Date of  Service: [name] [name] 01/07/2021 8:15 A M Medical Record Number: [id number] Patient Account Number: [id number] Date of Birth/Sex: Treating RN: 30-Jan-1946 (74 y.o. [name] Primary Care Provider: Consuello Masse Other Clinician: Referring Provider: Treating Provider/Extender: Zadie Cleverly in Treatment: 14 History of Present Illness HPI Description: ADMISSION 09/27/2020 This is a 75 year old man who lives in [place]. He apparently has had callus over the plantar fifth metatarsal head in the past for which she is followed by podiatry in [place]. They shaved down the callus and this was done in January. He states that he went to [place] in January and became aware of when he was there of an open wound in this area. He followed up with podiatry and has been soaking this twice a day with Epson salts and using Silvadene. Apparently one of the podiatrist told him he may need surgery because of bone protrusion. Past medical history includes an ischemic cardiomyopathy, type 2 diabetes with peripheral neuropathy, BPH, PVD, orthostatic hypotension, atrial fibrillation, hyperlipidemia and hypertension Arterial studies in 2018 showed a noncompressible ABI on the left. It was again noncompressible today at 1.7 although his pulses are easily palpable 4/8; patient I admitted to the clinic last week. Wound on the plantar left fifth metatarsal head which has been refractory. He also has a bit of subluxation of the bone in the head. He is a type II diabetic with peripheral neuropathy. We used silver collagen after debridement He arrives back in clinic today. He has erythema spreading into the lateral part of the fifth metatarsal head. I removed some undermining tissue from around the wound and clearly there is a connection between the wound and the lateral part of the fifth met head. A culture was done. Area of erythema on the lateral part of the foot was marked. His wife  says that has been there since Wednesday 4/15; the patient arrived last week with erythema spreading in the lateral part of the fifth metatarsal head. There was a wound connected with  the plantar fifth met head original wound. I gave him empiric Augmentin. Surprisingly the culture I did was negative. We have been using silver alginate. The wound has been open since January. He arrives in clinic today with the erythema much better 4/22; patient presents today for 1 week follow-up of his fifth metatarsal head wound. Daughter is present with the patient. She states that the wound has improved significantly since 2 weeks ago. There is no longer redness to the foot and the actual wound bed is smaller. Patient overall feels well and has no complaints today. 4/29; patient presents for 1 week follow-up of his fifth metatarsal head wound. Daughter is present. Patient has been using silver alginate every other day to the wound site. He has tried to relieve pressure with a surgical shoe. He denies signs of infections. 5/6; fifth metatarsal head wound. He has been using silver alginate changed to Santyl last week which he managed to obtain for $21 with a coupon we gave him. He also has an appointment with vein and vascular. I think Dr. Heber Sparks referred him. He has noncompressible ABIs. The patient also had an x-ray and that was negative 5/13; the patient went to see [name]. Areas on the left fifth metatarsal head plantar and lateral. [name] noted that his ABI in the right was 0.99 but monophasic waveforms with a TBI at 0.32. He wanted to go ahead with an angiogram which is booked for Thursday. I talked to the patient about this and advised him to go forward with this. 5/23; patient presents for 1 week follow-up. He has been using silver alginate to the wound bed. He has no complaints today. Denies signs of infection. He had an arteriogram done on 5/19. 5/31; angiogram showed patent common femoral, SFA,  profunda, above-knee popliteal artery. 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 He does have inline flow down the left lower extremity through the peroneal that is widely patent. The peroneal gave off collaterals distally to the anterior tibial that feels retrograde at the ankle. An attempted recanalization of the left anterior tibial artery was made but was not successful. The plan as outlined by [name] is that he will continue getting aggressive wound care and he would consider a retrograde left anterior tibial intervention if there is no improvement. Wound itself does not look as though it is making any improvement. There is still tunneling superiorly precariously close to bone 6/3; back for the obligatory first total contact cast change. The wound clearly has exposed bone. He is a type II diabetic. He has been revascularized. Plain x- ray did not show osteomyelitis he may require an MRI. After his revascularization I elected to put him into a cast to make sure that all the pressure was coming off this area 6/7; patient back for his routine visit. The wound is not doing well. The patient had a lot of discomfort since he was last here 4 days ago. No systemic illness. He will not go back in the total contact cast today. 6/14; culture I did last time showed Enterobacter. At the time there was erythema and swelling I put him on Augmentin and Bactrim empirically. The Enterobacter was sensitive to the Bactrim DS. He sees [name] tomorrow. I am looking for her recommendation about antibiotic choices and route of administration. He sees [name] of vascular surgery on 6/21. I have communicated with [name] I would  like to see whether or not further revascularization will be entertained and whether [name] feels that he could survive a foot conserving surgery if that is the route the patient wishes to go.  After these next 2 consultations we may be able to discuss the route the patient wants to take either a surgical 1 or perhaps 1 that involves a prolonged course of antibiotics and hyperbaric oxygen. I touch bases with the patient and his daughter on all of this. MRI suggested osteomyelitis in the distal 2 cm of the fifth metatarsal 6/24; the patient was kindly seen by [name] of infectious disease by telehealth visit. He has established a PICC line and is on a Carbapenem for 4 weeks although I am not really sure which 1. Initial culture was Enterobacter I believe this was a swab. He is also followed up with [name]. He again is considering an anterograde anterior tibial attempted revascularization. The patient is to call if they agree to move forward with the anterograde attempt. He started an IV antibiotics already. He arrives in clinic today with again the wound deteriorating. There is exposed bone widely which is quite a bit worse than last week. I did obtain a piece of this for culture but more bone debridement is likely going to need to be done. I had an extensive discussion with the patient and his daughter. The problem is the ray amputation success depends largely on blood flow. [name] did not sound optimistic about this. However I would definitely go ahead with the retrograde attempt by [name] even though there are potential risks. I explained this to the patient. If we are going to have any attempt to salvage this man's foot we are going to need blood flow and I would go ahead with this. IV antibiotics in the meantime and consideration of hyperbaric oxygen all of these things I discussed. The option would be a below-knee amputation which the patient does not seem ready for his daughter is certainly not ready for. But we also talked about this 6/30; bone culture I did last week showed the same Enterobacter cloacae I as previous. Also rare Staph epidermidis. I am not really sure of  the significance of this which may be a contamination. He has a anterior grade access attempt by [name] of vein and vascular on 7/7. After we see the result of this I will go over his options which are continued antibiotics and hyperbarics versus an attempt at a ray amputation if [name] is successful in establishing more blood flow here. 7/12 the patient saw [name] yesterday who extended his IV antibiotics. He also had an attempt at left anterior tibial artery retrograde revascularization by [name] however this was not successful. We are therefore looking at the same blood flow that we had previously. I had an extensive discussion with him today. I think he has a good chance that healing ray amputation. The other option is to continue his IV antibiotics and initiate hyperbaric oxygen therapy. The patient seems to be opting for the latter Electronic Signature(s) Signed: 01/07/2021 5:25:56 PM By: Linton Ham MD Entered By: Linton Ham on 01/07/2021 09:24:39 -------------------------------------------------------------------------------- Physical Exam Details Patient Name: Date of Service: [name] [name] 01/07/2021 8:15 A M Medical Record Number: [id number] Patient Account Number: [id number] Date of Birth/Sex: Treating RN: 06/13/1946 (75 y.o. [name], [name] Primary Care Provider: Consuello Masse Other Clinician: Referring Provider: Treating Provider/Extender: Zadie Cleverly in Treatment: [phone]  Constitutional Sitting or standing Blood Pressure is within target range for patient.. Pulse regular and within target range for patient.Marland Kitchen Respirations regular, non-labored and within target range.. Temperature is normal and within the target range for the patient.Marland Kitchen Appears in no distress. Respiratory work of breathing is normal. Cardiovascular Heart rhythm and rate regular, without murmur or gallop. No evidence of CHF. Notes Wound exam; still the same  depth. He has some granulation proximally but the other 50% of the wound is completely necrotic. I used a #3 curette then pickups and scissors to remove subcutaneous tissue, some denuded tendon. There is no evidence of surrounding infection. In the middle of this wound however it still probes easily to bone Electronic Signature(s) Signed: 01/07/2021 5:25:56 PM By: Linton Ham MD Entered By: Linton Ham on 01/07/2021 09:25:40 -------------------------------------------------------------------------------- Physician Orders Details Patient Name: Date of Service: [name] [name] 01/07/2021 8:15 A M Medical Record Number: [id number] Patient Account Number: [id number] Date of Birth/Sex: Treating RN: 04-21-1946 (75 y.o. [name] Primary Care Provider: Consuello Masse Other Clinician: Referring Provider: Treating Provider/Extender: Zadie Cleverly in Treatment: [phone] Verbal / Phone Orders: No Diagnosis Coding Follow-up Appointments ppointment in 2 weeks. - Tuesday Dr. Dellia Nims Return A Other: - Dr. Baxter Flattery extending IV antibiotic for 2 more weeks Follow up with [facility] on July 26 Bathing/ Shower/ Hygiene May shower and wash wound with soap and water. - on days changing dressing. Do not soak foot. Edema Control - Lymphedema / SCD / Other Elevate legs to the level of the heart or above for 30 minutes daily and/or when sitting, a frequency of: - throughout the day Avoid standing for long periods of time. Exercise regularly Off-Loading Open toe surgical shoe to: - left foot Additional Orders / Instructions Follow Nutritious Diet Hyperbaric Oxygen Therapy Evaluate for HBO Therapy Indication: - Osteomyelitis of Right lateral foot 2.0 ATA for 90 Minutes with 2 Five (5) Minute A Breaks ir Total Number of Treatments: - 40 One treatments per day (delivered Monday through Friday unless otherwise specified in Special Instructions below): Finger stick Blood Glucose  Pre- and Post- HBOT Treatment. Follow Hyperbaric Oxygen Glycemia Protocol Afrin (Oxymetazoline HCL) 0.05% nasal spray - 1 spray in both nostrils daily as needed prior to HBO treatment for difficulty clearing ears Wound Treatment Wound #1 - Metatarsal head fifth Wound Laterality: Plantar, Left Cleanser: Soap and Water 1 x Per Day/30 Days Discharge Instructions: May shower and wash wound with dial antibacterial soap and water prior to dressing change. Cleanser: Wound Cleanser (Generic) 1 x Per Day/30 Days Discharge Instructions: Cleanse the wound with wound cleanser prior to applying a clean dressing using gauze sponges, not tissue or cotton balls. Prim Dressing: KerraCel Ag Gelling Fiber Dressing, 4x5 in (silver alginate) (Generic) 1 x Per Day/30 Days ary Discharge Instructions: Apply silver alginate to wound bed as instructed Secondary Dressing: Woven Gauze Sponges 2x2 in (Generic) 1 x Per Day/30 Days Discharge Instructions: Apply over primary dressing as directed. Secondary Dressing: Zetuvit Plus Silicone Border Dressing 4x4 (in/in) (Generic) 1 x Per Day/30 Days Discharge Instructions: Apply silicone border over primary dressing as directed. Secured With: [employer], 4.5x3.1 (in/yd) (Generic) 1 x Per Day/30 Days Discharge Instructions: Secure with Kerlix as directed. Secured With: 5M Medipore H Soft Cloth Surgical T 4 x 2 (in/yd) (Generic) 1 x Per Day/30 Days ape Discharge Instructions: Secure dressing with tape as directed. Radiology X-ray, Chest GLYCEMIA INTERVENTIONS PROTOCOL PRE-HBO GLYCEMIA INTERVENTIONS ACTION INTERVENTION Obtain pre-HBO capillary blood  glucose (ensure 1 physician order is in chart). A. Notify HBO physician and await physician orders. 2 If result is 70 mg/dl or below: B. If the result meets the hospital definition of a critical result, follow hospital policy. A. Give patient an 8 ounce Glucerna Shake, an 8 ounce Ensure, or 8 ounces of  a Glucerna/Ensure equivalent dietary supplement*. B. Wait 30 minutes. If result is 71 mg/dl to 130 mg/dl: C. Retest patients capillary blood glucose (CBG). D. If result greater than or equal to 110 mg/dl, proceed with HBO. If result less than 110 mg/dl, notify HBO physician and consider holding HBO. If result is 131 mg/dl to 249 mg/dl: A. Proceed with HBO. A. Notify HBO physician and await physician orders. B. It is recommended to hold HBO and do If result is 250 mg/dl or greater: blood/urine ketone testing. C. If the result meets the hospital definition of a critical result, follow hospital policy. POST-HBO GLYCEMIA INTERVENTIONS ACTION INTERVENTION Obtain post HBO capillary blood glucose (ensure 1 physician order is in chart). A. Notify HBO physician and await physician orders. 2 If result is 70 mg/dl or below: B. If the result meets the hospital definition of a critical result, follow hospital policy. A. Give patient an 8 ounce Glucerna Shake, an 8 ounce Ensure, or 8 ounces of a Glucerna/Ensure equivalent dietary supplement*. B. Wait 15 minutes for symptoms of If result is 71 mg/dl to 100 mg/dl: hypoglycemia (i.e. nervousness, anxiety, sweating, chills, clamminess, irritability, confusion, tachycardia or dizziness). C. If patient asymptomatic, discharge patient. If patient symptomatic, repeat capillary blood glucose (CBG) and notify HBO physician. If result is 101 mg/dl to 249 mg/dl: A. Discharge patient. A. Notify HBO physician and await physician orders. B. It is recommended to do blood/urine ketone If result is 250 mg/dl or greater: testing. C. If the result meets the hospital definition of a critical result, follow hospital policy. *Juice or candies are NOT equivalent products. If patient refuses the Glucerna or Ensure, please consult the hospital dietitian for an appropriate substitute. Electronic Signature(s) Signed: 01/07/2021 5:25:56 PM By: Linton Ham MD Signed: 01/08/2021 6:12:45 PM By: Rhae Hammock RN Entered By: Rhae Hammock on 01/07/2021 09:10:29 Prescription 01/07/2021 -------------------------------------------------------------------------------- Leta Speller MD Patient Name: Provider: October 15, 1945 [id number] Date of Birth: NPI#: Jerilynn Mages [id number] Sex: DEA #: [phone] [id number] Phone #: License #: [place] Patient Address: [address] [place], [place] [address] [place], [place] [address] [phone] Allergies No Known Allergies Provider's Orders X-ray, Chest Hand Signature: Date(s): Electronic Signature(s) Signed: 01/07/2021 5:25:56 PM By: Linton Ham MD Signed: 01/08/2021 6:12:45 PM By: Rhae Hammock RN Entered By: Rhae Hammock on 01/07/2021 09:10:30 -------------------------------------------------------------------------------- Problem List Details Patient Name: Date of Service: [name] [name] 01/07/2021 8:15 A M Medical Record Number: [id number] Patient Account Number: [id number] Date of Birth/Sex: Treating RN: [birth year] (75 y.o. [name], [name] Primary Care Provider: Consuello Masse Other Clinician: Referring Provider: Treating Provider/Extender: Zadie Cleverly in Treatment: 14 Active Problems ICD-10 Encounter Code Description Active Date MDM Diagnosis E11.621 Type 2 diabetes mellitus with foot ulcer 09/27/2020 No Yes E11.51 Type 2 diabetes mellitus with diabetic peripheral angiopathy without gangrene 11/11/2020 No Yes L97.528 Non-pressure chronic ulcer of other part of left foot with other specified 09/27/2020 No Yes severity M86.272 Subacute osteomyelitis, left ankle and foot 12/10/2020 No Yes E11.42 Type 2 diabetes mellitus with diabetic polyneuropathy 09/27/2020 No Yes Inactive Problems ICD-10 Code Description  Active Date Inactive Date L03.116 Cellulitis of left  lower limb 10/04/2020 10/04/2020 Resolved Problems Electronic Signature(s) Signed: 01/07/2021 5:25:56 PM By: Linton Ham MD Entered By: Linton Ham on 01/07/2021 09:22:30 -------------------------------------------------------------------------------- Progress Note Details Patient Name: Date of Service: [name] [name] 01/07/2021 8:15 A M Medical Record Number: [id number] Patient Account Number: [id number] Date of Birth/Sex: Treating RN: 09-29-1945 (75 y.o. [name] Primary Care Provider: Consuello Masse Other Clinician: Referring Provider: Treating Provider/Extender: Zadie Cleverly in Treatment: 14 Subjective History of Present Illness (HPI) ADMISSION 09/27/2020 This is a 75 year old man who lives in [place]. He apparently has had callus over the plantar fifth metatarsal head in the past for which she is followed by podiatry in [place]. They shaved down the callus and this was done in January. He states that he went to [place] in January and became aware of when he was there of an open wound in this area. He followed up with podiatry and has been soaking this twice a day with Epson salts and using Silvadene. Apparently one of the podiatrist told him he may need surgery because of bone protrusion. Past medical history includes an ischemic cardiomyopathy, type 2 diabetes with peripheral neuropathy, BPH, PVD, orthostatic hypotension, atrial fibrillation, hyperlipidemia and hypertension Arterial studies in 2018 showed a noncompressible ABI on the left. It was again noncompressible today at 1.7 although his pulses are easily palpable 4/8; patient I admitted to the clinic last week. Wound on the plantar left fifth metatarsal head which has been refractory. He also has a bit of subluxation of the bone in the head. He is a type II diabetic with peripheral neuropathy. We used silver collagen after debridement He arrives back in clinic today. He has  erythema spreading into the lateral part of the fifth metatarsal head. I removed some undermining tissue from around the wound and clearly there is a connection between the wound and the lateral part of the fifth met head. A culture was done. Area of erythema on the lateral part of the foot was marked. His wife says that has been there since Wednesday 4/15; the patient arrived last week with erythema spreading in the lateral part of the fifth metatarsal head. There was a wound connected with the plantar fifth met head original wound. I gave him empiric Augmentin. Surprisingly the culture I did was negative. We have been using silver alginate. The wound has been open since January. He arrives in clinic today with the erythema much better 4/22; patient presents today for 1 week follow-up of his fifth metatarsal head wound. Daughter is present with the patient. She states that the wound has improved significantly since 2 weeks ago. There is no longer redness to the foot and the actual wound bed is smaller. Patient overall feels well and has no complaints today. 4/29; patient presents for 1 week follow-up of his fifth metatarsal head wound. Daughter is present. Patient has been using silver alginate every other day to the wound site. He has tried to relieve pressure with a surgical shoe. He denies signs of infections. 5/6; fifth metatarsal head wound. He has been using silver alginate changed to Santyl last week which he managed to obtain for $21 with a coupon we gave him. He also has an appointment with vein and vascular. I think Dr. Heber Wallaceton referred him. He has noncompressible ABIs. The patient also had an x-ray and that was negative 5/13; the patient went to see [name]. Areas on the  left fifth metatarsal head plantar and lateral. [name] noted that his ABI in the right was 0.99 but monophasic waveforms with a TBI at 0.32. He wanted to go ahead with an angiogram which is booked for Thursday. I  talked to the patient about this and advised him to go forward with this. 5/23; patient presents for 1 week follow-up. He has been using silver alginate to the wound bed. He has no complaints today. Denies signs of infection. He had an arteriogram done on 5/19. 5/31; angiogram showed patent common femoral, SFA, profunda, above-knee popliteal artery. 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 He does have inline flow down the left lower extremity through the peroneal that is widely patent. The peroneal gave off collaterals distally to the anterior tibial that feels retrograde at the ankle. An attempted recanalization of the left anterior tibial artery was made but was not successful. The plan as outlined by [name] is that he will continue getting aggressive wound care and he would consider a retrograde left anterior tibial intervention if there is no improvement. Wound itself does not look as though it is making any improvement. There is still tunneling superiorly precariously close to bone 6/3; back for the obligatory first total contact cast change. The wound clearly has exposed bone. He is a type II diabetic. He has been revascularized. Plain x- ray did not show osteomyelitis he may require an MRI. After his revascularization I elected to put him into a cast to make sure that all the pressure was coming off this area 6/7; patient back for his routine visit. The wound is not doing well. The patient had a lot of discomfort since he was last here 4 days ago. No systemic illness. He will not go back in the total contact cast today. 6/14; culture I did last time showed Enterobacter. At the time there was erythema and swelling I put him on Augmentin and Bactrim empirically. The Enterobacter was sensitive to the Bactrim DS. He sees [name] tomorrow. I am looking for her recommendation about antibiotic choices and  route of administration. He sees [name] of vascular surgery on 6/21. I have communicated with [name] I would like to see whether or not further revascularization will be entertained and whether [name] feels that he could survive a foot conserving surgery if that is the route the patient wishes to go. After these next 2 consultations we may be able to discuss the route the patient wants to take either a surgical 1 or perhaps 1 that involves a prolonged course of antibiotics and hyperbaric oxygen. I touch bases with the patient and his daughter on all of this. MRI suggested osteomyelitis in the distal 2 cm of the fifth metatarsal 6/24; the patient was kindly seen by [name] of infectious disease by telehealth visit. He has established a PICC line and is on a Carbapenem for 4 weeks although I am not really sure which 1. Initial culture was Enterobacter I believe this was a swab. He is also followed up with [name]. He again is considering an anterograde anterior tibial attempted revascularization. The patient is to call if they agree to move forward with the anterograde attempt. He started an IV antibiotics already. He arrives in clinic today with again the wound deteriorating. There is exposed bone widely which is quite a bit worse than last week. I did obtain a piece of  this for culture but more bone debridement is likely going to need to be done. I had an extensive discussion with the patient and his daughter. The problem is the ray amputation success depends largely on blood flow. [name] did not sound optimistic about this. However I would definitely go ahead with the retrograde attempt by [name] even though there are potential risks. I explained this to the patient. If we are going to have any attempt to salvage this man's foot we are going to need blood flow and I would go ahead with this. IV antibiotics in the meantime and consideration of hyperbaric oxygen all of these  things I discussed. The option would be a below-knee amputation which the patient does not seem ready for his daughter is certainly not ready for. But we also talked about this 6/30; bone culture I did last week showed the same Enterobacter cloacae I as previous. Also rare Staph epidermidis. I am not really sure of the significance of this which may be a contamination. He has a anterior grade access attempt by [name] of vein and vascular on 7/7. After we see the result of this I will go over his options which are continued antibiotics and hyperbarics versus an attempt at a ray amputation if [name] is successful in establishing more blood flow here. 7/12 the patient saw [name] yesterday who extended his IV antibiotics. He also had an attempt at left anterior tibial artery retrograde revascularization by [name] however this was not successful. We are therefore looking at the same blood flow that we had previously. I had an extensive discussion with him today. I think he has a good chance that healing ray amputation. The other option is to continue his IV antibiotics and initiate hyperbaric oxygen therapy. The patient seems to be opting for the latter Objective Constitutional Sitting or standing Blood Pressure is within target range for patient.. Pulse regular and within target range for patient.Marland Kitchen Respirations regular, non-labored and within target range.. Temperature is normal and within the target range for the patient.Marland Kitchen Appears in no distress. Vitals Time Taken: 8:30 AM, Height: 74 in, Weight: 238 lbs, BMI: 30.6, Temperature: 97.5 F, Pulse: 64 bpm, Respiratory Rate: 18 breaths/min, Blood Pressure: 111/67 mmHg, Capillary Blood Glucose: 103 mg/dl. Respiratory work of breathing is normal. Cardiovascular Heart rhythm and rate regular, without murmur or gallop. No evidence of CHF. General Notes: Wound exam; still the same depth. He has some granulation proximally but the other 50% of  the wound is completely necrotic. I used a #3 curette then pickups and scissors to remove subcutaneous tissue, some denuded tendon. There is no evidence of surrounding infection. In the middle of this wound however it still probes easily to bone Integumentary (Hair, Skin) Wound #1 status is Open. Original cause of wound was Gradually Appeared. The date acquired was: 06/29/2020. The wound has been in treatment 14 weeks. The wound is located on the Left,Plantar Metatarsal head fifth. The wound measures 0.9cm length x 2.1cm width x 1.1cm depth; 1.484cm^2 area and 1.633cm^3 volume. There is bone, tendon, and Fat Layer (Subcutaneous Tissue) exposed. There is no tunneling noted, however, there is undermining starting at 12:00 and ending at 12:00 with a maximum distance of 0.8cm. There is a medium amount of serosanguineous drainage noted. The wound margin is well defined and not attached to the wound base. There is medium (34-66%) red granulation within the wound bed. There is a medium (34-66%) amount of necrotic tissue within  the wound bed including Adherent Slough. General Notes: hard fragment noted in wound bed. 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 This showed tibial artery disease. Attempts were made at both anterograde and retrograde anterior tibial recanalization both of which were not successful. Largely his flow is through the peroneal artery. There are collaterals given off at the ankle to the anterior tibial that fills retrogradely at the ankle. He had a second attempt at retrograde recanalization of the anterior tibial artery  at my request 01/02/21. Unfortunately this was also unsuccessful. In spite of this I think the patient has sufficient blood flow and attempt to heal his wound area. ABIs show a ABI on the right at 1.31, TBI 0.45 monophasic waveforms on the left ABI of 0.99 TBI of 0.24 monophasic waveforms 11/01/2020 Nutritional management The patient is a healthy 75 year old. There is no reason to suspect nutritional insufficiency. Weight is 238lbs at 57ft 2 inches. BMI 30.6. Glucose control Most recent hemoglobin A1c on 10/17/2020 was 7 Debridement wound bed management Wound has exposed bone. but some degree of healthy granulation. Previous debridements have been done. Wound surface is optimized. Offloading Surgical shoe. He did not tolerate forefoot offloading boot Control of infection Patient has been followed by infectious disease Dr. Baxter Flattery. Currently on Invanz starting on 12/11/2020 through the beginning of August [6-week course]. This was in response to our cultures that showed Enterobacter cloacae MRI results MRI was on 12/04/2020. This shows findings consistent with osteomyelitis in the distal 2 cm of the fifth metatarsal. Skin ulceration adjacent to the fifth metatarsal head negative for underlying abscess. No evidence of a septic joint Labs reviewed The patient has had improvement in his inflammatory markers. CRP on 12/11/2020 was 28.2 recently repeated at 4.0. Sedimentation rate is also improved in the same timeframe from 33-22. White count has been normal most recently at 6.1. Plan of care/Summary 1. Plan to initiate hyperbaric oxygen at 2 atm for 40 treatments for a Wagner grade 3 diabetic foot ulcer. No air breaks 2. He will continue to follow with infectious disease and vascular. Continue with current antibiotics 3. It is uncertain whether the patient would be able to heal a ray amputation of this wound. Therefore this wound represents a limb threatening situation. 4 in spite of #3 the goal here is  still to heal this wound. Hyperbaric oxygen and IV antibiotics represent the best chance to do so. He still has exposed bone but has some viable granulation 5. The patient wishes to pursue IV antibiotics and hyperbaric oxygen for a chance to heal this wound. T date he is refused surgical consultation o Procedures Wound #1 Pre-procedure diagnosis of Wound #1 is a Diabetic Wound/Ulcer of the Lower Extremity located on the Left,Plantar Metatarsal head fifth .Severity of Tissue Pre Debridement is: Fat layer exposed. There was a Excisional Skin/Subcutaneous Tissue/Muscle Debridement with a total area of 1.89 sq cm performed by Ricard Dillon., MD. With the following instrument(s): Curette, Forceps, and Scissors to remove Viable and Non-Viable tissue/material. Material removed includes T endon, Subcutaneous Tissue, Slough, Skin: Dermis, and Skin: Epidermis after achieving pain control using Lidocaine. No specimens were taken. A time out was conducted at 08:54, prior to the start  of the procedure. A Minimum amount of bleeding was controlled with Pressure. The procedure was tolerated well with a pain level of 0 throughout and a pain level of 0 following the procedure. Post Debridement Measurements: 0.9cm length x 2.1cm width x 1.1cm depth; 1.633cm^3 volume. Character of Wound/Ulcer Post Debridement is improved. Severity of Tissue Post Debridement is: Fat layer exposed. Post procedure Diagnosis Wound #1: Same as Pre-Procedure Plan Follow-up Appointments: Return Appointment in 2 weeks. - Tuesday Dr. Dellia Nims Other: - Dr. Baxter Flattery extending IV antibiotic for 2 more weeks Follow up with [facility] on July 26 Bathing/ Shower/ Hygiene: May shower and wash wound with soap and water. - on days changing dressing. Do not soak foot. Edema Control - Lymphedema / SCD / Other: Elevate legs to the level of the heart or above for 30 minutes daily and/or when sitting, a frequency of: - throughout the day Avoid standing for  long periods of time. Exercise regularly Off-Loading: Open toe surgical shoe to: - left foot Additional Orders / Instructions: Follow Nutritious Diet Hyperbaric Oxygen Therapy: Evaluate for HBO Therapy Indication: - Osteomyelitis of Right lateral foot 2.0 ATA for 90 Minutes with 2 Five (5) Minute Air Breaks T Number of Treatments: - 40 otal One treatments per day (delivered Monday through Friday unless otherwise specified in Special Instructions below): Finger stick Blood Glucose Pre- and Post- HBOT Treatment. Follow Hyperbaric Oxygen Glycemia Protocol Afrin (Oxymetazoline HCL) 0.05% nasal spray - 1 spray in both nostrils daily as needed prior to HBO treatment for difficulty clearing ears Radiology ordered were: X-ray, Chest WOUND #1: - Metatarsal head fifth Wound Laterality: Plantar, Left Cleanser: Soap and Water 1 x Per Day/30 Days Discharge Instructions: May shower and wash wound with dial antibacterial soap and water prior to dressing change. Cleanser: Wound Cleanser (Generic) 1 x Per Day/30 Days Discharge Instructions: Cleanse the wound with wound cleanser prior to applying a clean dressing using gauze sponges, not tissue or cotton balls. Prim Dressing: KerraCel Ag Gelling Fiber Dressing, 4x5 in (silver alginate) (Generic) 1 x Per Day/30 Days ary Discharge Instructions: Apply silver alginate to wound bed as instructed Secondary Dressing: Woven Gauze Sponges 2x2 in (Generic) 1 x Per Day/30 Days Discharge Instructions: Apply over primary dressing as directed. Secondary Dressing: Zetuvit Plus Silicone Border Dressing 4x4 (in/in) (Generic) 1 x Per Day/30 Days Discharge Instructions: Apply silicone border over primary dressing as directed. Secured With: [employer], 4.5x3.1 (in/yd) (Generic) 1 x Per Day/30 Days Discharge Instructions: Secure with Kerlix as directed. Secured With: 55M Medipore H Soft Cloth Surgical T 4 x 2 (in/yd) (Generic) 1 x Per Day/30 Days ape Discharge  Instructions: Secure dressing with tape as directed. 1. I am continuing with the silver alginate for now 2. We are going to initiate an attempt to get hyperbaric oxygen therapy for 40 treatments along with what ever is left of his IV antibiotics. 3. I had another frank discussion with him. I would give him about a 30 to 40% chance that we can actually heal this wound at least temporarily emphasizing that it is not uncommon for these wounds to breakdown and to have the underlying osteomyelitis return after treatment. 4. He has an ischemic cardiomyopathy. His cardiologist is Dr. Debara Pickett. He is status post CABG in 2013. His most recent ejection fraction was 40% he also has intermittent atrial fibrillation. He does not describe chest pain and before he had problems with his foot he did not have any exertional chest symptoms. His coronary artery disease  seems stable. 5. We will be looking at 2 atm for 40 treatments along with IV antibiotics. This assumes that he will not make a different decision with regards to a possible foot surgery. Electronic Signature(s) Signed: 01/10/2021 1:37:28 PM By: Linton Ham MD Previous Signature: 01/09/2021 4:54:40 PM Version By: Linton Ham MD Previous Signature: 01/07/2021 5:25:56 PM Version By: Linton Ham MD Entered By: Linton Ham on 01/10/2021 13:37:28 -------------------------------------------------------------------------------- SuperBill Details Patient Name: Date of Service: [name] [name] 01/07/2021 Medical Record Number: [id number] Patient Account Number: [id number] Date of Birth/Sex: Treating RN: Jul 02, 1945 (74 y.o. [name], [name] Primary Care Provider: Consuello Masse Other Clinician: Referring Provider: Treating Provider/Extender: Zadie Cleverly in Treatment: 14 Diagnosis Coding ICD-10 Codes Code Description E11.621 Type 2 diabetes mellitus with foot ulcer E11.51 Type 2 diabetes mellitus with diabetic  peripheral angiopathy without gangrene L97.528 Non-pressure chronic ulcer of other part of left foot with other specified severity M86.272 Subacute osteomyelitis, left ankle and foot E11.42 Type 2 diabetes mellitus with diabetic polyneuropathy Facility Procedures CPT4 Code: 73220254 Description: 27062 - DEB MUSC/FASCIA 20 SQ CM/< ICD-10 Diagnosis Description L97.528 Non-pressure chronic ulcer of other part of left foot with other specified seve Modifier: rity Quantity: 1 Physician Procedures : CPT4 Code Description Modifier [id number] 11043 - WC PHYS DEBR MUSCLE/FASCIA 20 SQ CM ICD-10 Diagnosis Description L97.528 Non-pressure chronic ulcer of other part of left foot with other specified severity Quantity: 1 Electronic Signature(s) Signed: 01/07/2021 5:25:56 PM By: Linton Ham MD Entered By: Linton Ham on 01/07/2021 09:29:48

## 2021-01-12 NOTE — Progress Notes (Signed)
Patient ID: [name] [name], male   DOB: 1946/03/14, 75 y.o.   MRN: [id number]  HPI [name] [name] is a 75yo M with left foot osteomyelitis, being cared for by dr Dellia Nims at wound care clinic. We saw him on 6/15 with plan t odo 4 wk of carbapenem to see if any improvement. Wound does appear improved, smaller in size. No difficulty with picc line.  Outpatient Encounter Medications as of 01/06/2021  Medication Sig   aspirin EC 81 MG tablet Take 81 mg by mouth at bedtime.    carvedilol (COREG) 6.25 MG tablet Take 1 tablet (6.25 mg total) by mouth 2 (two) times daily.   ELIQUIS 5 MG TABS tablet Take 1 tablet by mouth twice daily   empagliflozin (JARDIANCE) 10 MG TABS tablet Take 1 tablet (10 mg total) by mouth daily before breakfast.   ertapenem (INVANZ) IVPB Inject 1 g into the vein daily.   furosemide (LASIX) 40 MG tablet Take 40 mg by mouth in the morning.   gabapentin (NEURONTIN) 300 MG capsule Take 600 mg by mouth at bedtime.   glipiZIDE (GLUCOTROL XL) 10 MG 24 hr tablet Take 10 mg by mouth at bedtime.   metFORMIN (GLUCOPHAGE-XR) 500 MG 24 hr tablet Take 1,000 mg by mouth 2 (two) times daily.   ONETOUCH VERIO test strip 1 each daily.   rosuvastatin (CRESTOR) 20 MG tablet Take 10 mg by mouth every other day. In the evening   No facility-administered encounter medications on file as of 01/06/2021.     Patient Active Problem List   Diagnosis Date Noted   Erectile dysfunction due to arterial insufficiency 06/25/2020   Benign prostatic hyperplasia 05/17/2020   Weak urinary stream 05/17/2020   Atrial fibrillation ([place])    Pseudoclaudication 08/30/2017   Abnormal EKG 08/10/2017   Peripheral arterial disease ([place]) 04/21/2017   Oxygen desaturation 75/03/2584   Chronic systolic heart failure ([place]) 05/27/2016   Obesity (BMI 30-39.9) 02/16/2016   Non-ischemic cardiomyopathy ([place]) 12/24/2015   Medication management 12/24/2015   SOB (shortness of breath) 12/24/2015   LV dysfunction    Acute  systolic congestive heart failure, NYHA class 3 ([place]) 09/12/2015   OSA (obstructive sleep apnea) 04/17/2015   Spinal stenosis of lumbar region 09/12/2014   Spinal stenosis at L4-L5 level 09/12/2014   Dyspnea 10/13/2013   Fatigue 10/13/2013   Hypotension 08/09/2013   Chest pain 08/09/2013   S/P CABG x 3: (LIMA-LAD, SVG-PD, SVG-PL) 08/04/2011   Coronary artery disease involving native coronary artery of native heart without angina pectoris 07/06/2011   Vasovagal syncope 06/03/2011   Dehydration 06/02/2011   Ischemic cardiomyopathy 06/02/2011   DM2 (diabetes mellitus, type 2) ([place]) 06/02/2011   Essential hypertension 06/02/2011     Health Maintenance Due  Topic Date Due   FOOT EXAM  Never done   OPHTHALMOLOGY EXAM  Never done   URINE MICROALBUMIN  Never done   Hepatitis C Screening  Never done   TETANUS/TDAP  Never done   Zoster Vaccines- Shingrix (1 of 2) Never done   PNA vac Low Risk Adult (1 of 2 - PCV13) Never done   COVID-19 Vaccine (3 - Moderna risk series) 09/07/2019     Review of Systems  Physical Exam   BP 114/63   Pulse (!) 57   Temp 97.7 F (36.5 C)   Resp 16   Wt 230 lb (104.3 kg)   SpO2 97%   BMI 29.53 kg/m    Physical Exam  Constitutional: He is  oriented to person, place, and time. He appears well-developed and well-nourished. No distress.  HENT:  Mouth/Throat: Oropharynx is clear and moist. No oropharyngeal exudate.  Cardiovascular: Normal rate, regular rhythm and normal heart sounds. Exam reveals no gallop and no friction rub.  No murmur heard.  Pulmonary/Chest: Effort normal and breath sounds normal. No respiratory distress. He has no wheezes.  Ext= picc line is c/d/I. Foot lesion smaller in size. Good granulation tissue. Skin: Skin is warm and dry. No rash noted. No erythema.  Psychiatric: He has a normal mood and affect. His behavior is normal.   CBC Lab Results  Component Value Date   WBC 6.3 12/11/2020   RBC 4.54 12/11/2020   HGB 10.9 (L)  01/02/2021   HCT 32.0 (L) 01/02/2021   PLT 293 12/11/2020   MCV 87.2 12/11/2020   MCH 28.6 12/11/2020   MCHC 32.8 12/11/2020   RDW 13.7 12/11/2020   LYMPHSABS 1,329 12/11/2020   MONOABS 0.6 08/20/2015   EOSABS 271 12/11/2020    BMET Lab Results  Component Value Date   NA 142 01/02/2021   K 3.8 01/02/2021   CL 105 01/02/2021   CO2 22 12/11/2020   GLUCOSE 127 (H) 01/02/2021   BUN 24 (H) 01/02/2021   CREATININE 1.00 01/02/2021   CALCIUM 9.3 12/11/2020   GFRNONAA 69 01/22/2020   GFRAA 80 01/22/2020   Lab Results  Component Value Date   ESRSEDRATE 33 (H) 12/11/2020      Assessment and Plan  Dfu/osteomyelitis, polymicrobial with enterobacter & staph epi-plan to extend for 2 additional weeks then remove picc line and reassess inflammatory markers. Plan to change to orals, bactrim ds 1 tab daily, thereafter if needed to see that inflammatory markers normalize and wound continuing to improve  Pad= had seen vascular surgeon for evaluation. Has 1 vessel run off but unable to do other intervention

## 2021-01-13 ENCOUNTER — Other Ambulatory Visit: Payer: Self-pay | Admitting: Internal Medicine

## 2021-01-13 NOTE — Progress Notes (Signed)
ABEM, SHADDIX ([id number]) Visit Report for 01/07/2021 Arrival Information Details Patient Name: Date of Service: [name] [name], [name] [name] 01/07/2021 8:15 A M Medical Record Number: [id number] Patient Account Number: [id number] Date of Birth/Sex: Treating RN: 12-May-1946 (75 y.o. [name] Primary Care Jackquelyn Sundberg: Consuello Masse Other Clinician: Referring Tilda Samudio: Treating Sewell Pitner/Extender: Zadie Cleverly in Treatment: 14 Visit Information History Since Last Visit All ordered tests and consults were completed: Yes Patient Arrived: Cane Added or deleted any medications: No Arrival Time: 08:30 Any new allergies or adverse reactions: No Accompanied By: daughter Had a fall or experienced change in No Transfer Assistance: None activities of daily living that may affect Patient Identification Verified: Yes risk of falls: Secondary Verification Process Completed: Yes Signs or symptoms of abuse/neglect since No Patient Requires Transmission-Based Precautions: No last visito Patient Has Alerts: No Hospitalized since last visit: No Implantable device outside of the clinic No excluding cellular tissue based products placed in the center since last visit: Has Dressing in Place as Prescribed: Yes Has Footwear/Offloading in Place as Yes Prescribed: Left: Surgical Shoe with Pressure Relief Insole Pain Present Now: No Electronic Signature(s) Signed: 01/07/2021 6:16:29 PM By: Deon Pilling Entered By: Deon Pilling on 01/07/2021 08:34:37 -------------------------------------------------------------------------------- Encounter Discharge Information Details Patient Name: Date of Service: [name] [name] 01/07/2021 8:15 A M Medical Record Number: [id number] Patient Account Number: [id number] Date of Birth/Sex: Treating RN: 04/30/1946 (75 y.o. [name] Primary Care Texanna Hilburn: Consuello Masse Other Clinician: Referring Brylinn Teaney: Treating Preet Perrier/Extender: Zadie Cleverly in Treatment: 14 Encounter Discharge Information Items Discharge Condition: Stable Ambulatory Status: Cane Discharge Destination: Home Transportation: Private Auto Accompanied By: Daughter Schedule Follow-up Appointment: Yes Clinical Summary of Care: Provided on 01/07/2021 Form Type Recipient Paper Patient Patient Electronic Signature(s) Signed: 01/07/2021 9:00:29 AM By: Lorrin Jackson Entered By: Lorrin Jackson on 01/07/2021 09:00:29 -------------------------------------------------------------------------------- Lower Extremity Assessment Details Patient Name: Date of Service: [name] [name] 01/07/2021 8:15 A M Medical Record Number: [id number] Patient Account Number: [id number] Date of Birth/Sex: Treating RN: May 04, 1946 (75 y.o. [name] Primary Care Tyniesha Howald: Consuello Masse Other Clinician: Referring Greidys Deland: Treating Jessina Marse/Extender: Zadie Cleverly in Treatment: 14 Edema Assessment Assessed: Shirlyn Goltz: Yes] Patrice Paradise: No] Edema: [Left: Ye] [Right: s] Calf Left: Right: Point of Measurement: 40 cm From Medial Instep 37 cm Ankle Left: Right: Point of Measurement: 10 cm From Medial Instep 25 cm Vascular Assessment Pulses: Dorsalis Pedis Palpable: [Left:Yes] Electronic Signature(s) Signed: 01/07/2021 6:16:29 PM By: Deon Pilling Entered By: Deon Pilling on 01/07/2021 08:35:49 -------------------------------------------------------------------------------- Multi Wound Chart Details Patient Name: Date of Service: [name] [name] 01/07/2021 8:15 A M Medical Record Number: [id number] Patient Account Number: [id number] Date of Birth/Sex: Treating RN: August 21, 1945 (75 y.o. [name], [name] Primary Care Kalyiah Saintil: Consuello Masse Other Clinician: Referring Tremane Spurgeon: Treating Mora Pedraza/Extender: Zadie Cleverly in Treatment: 14 Vital Signs Height(in): 74 Capillary Blood Glucose(mg/dl):  103 Weight(lbs): 238 Pulse(bpm): 102 Body Mass Index(BMI): 31 Blood Pressure(mmHg): 111/67 Temperature(F): 97.5 Respiratory Rate(breaths/min): 18 Photos: [1:No Photos Left, Plantar Metatarsal head fifth] [N/A:N/A N/A] Wound Location: [1:Gradually Appeared] [N/A:N/A] Wounding Event: [1:Diabetic Wound/Ulcer of the Lower] [N/A:N/A] Primary Etiology: [1:Extremity Sleep Apnea, Arrhythmia, Congestive N/A] Comorbid History: [1:Heart Failure, Coronary Artery Disease, Type II Diabetes, Osteoarthritis, Neuropathy 06/29/2020] [N/A:N/A] Date Acquired: [1:14] [N/A:N/A] Weeks of Treatment: [1:Open] [N/A:N/A] Wound Status: [1:0.9x2.1x1.1] [N/A:N/A] Measurements L x W x D (cm) [1:1.484] [N/A:N/A] A (cm) : rea [1:1.633] [N/A:N/A] Volume (cm) : [1:-1990.10%] [N/A:N/A] % Reduction in A [  1:rea: -7676.20%] [N/A:N/A] % Reduction in Volume: [1:12] Starting Position 1 (o'clock): [1:12] Ending Position 1 (o'clock): [1:0.8] Maximum Distance 1 (cm): [1:Yes] [N/A:N/A] Undermining: [1:Grade 3] [N/A:N/A] Classification: [1:Medium] [N/A:N/A] Exudate A mount: [1:Serosanguineous] [N/A:N/A] Exudate Type: [1:red, brown] [N/A:N/A] Exudate Color: [1:Well defined, not attached] [N/A:N/A] Wound Margin: [1:Medium (34-66%)] [N/A:N/A] Granulation A mount: [1:Red] [N/A:N/A] Granulation Quality: [1:Medium (34-66%)] [N/A:N/A] Necrotic A mount: [1:Fat Layer (Subcutaneous Tissue): Yes N/A] Exposed Structures: [1:Tendon: Yes Bone: Yes Fascia: No Muscle: No Joint: No None] [N/A:N/A] Epithelialization: [1:Debridement - Excisional] [N/A:N/A] Debridement: Pre-procedure Verification/Time Out 08:54 [N/A:N/A] Taken: [1:Lidocaine] [N/A:N/A] Pain Control: [1:Tendon, Subcutaneous, Slough] [N/A:N/A] Tissue Debrided: [1:Skin/Subcutaneous Tissue/Muscle] [N/A:N/A] Level: [1:1.89] [N/A:N/A] Debridement A (sq cm): [1:rea Curette, Forceps, Scissors] [N/A:N/A] Instrument: [1:Minimum] [N/A:N/A] Bleeding: [1:Pressure]  [N/A:N/A] Hemostasis A chieved: [1:0] [N/A:N/A] Procedural Pain: [1:0] [N/A:N/A] Post Procedural Pain: [1:Procedure was tolerated well] [N/A:N/A] Debridement Treatment Response: [1:0.9x2.1x1.1] [N/A:N/A] Post Debridement Measurements L x W x D (cm) [1:1.633] [N/A:N/A] Post Debridement Volume: (cm) [1:hard fragment noted in wound bed.] [N/A:N/A] Assessment Notes: [1:Debridement] [N/A:N/A] Treatment Notes Wound #1 (Metatarsal head fifth) Wound Laterality: Plantar, Left Cleanser Soap and Water Discharge Instruction: May shower and wash wound with dial antibacterial soap and water prior to dressing change. Wound Cleanser Discharge Instruction: Cleanse the wound with wound cleanser prior to applying a clean dressing using gauze sponges, not tissue or cotton balls. Peri-Wound Care Topical Primary Dressing KerraCel Ag Gelling Fiber Dressing, 4x5 in (silver alginate) Discharge Instruction: Apply silver alginate to wound bed as instructed Secondary Dressing Woven Gauze Sponges 2x2 in Discharge Instruction: Apply over primary dressing as directed. Zetuvit Plus Silicone Border Dressing 4x4 (in/in) Discharge Instruction: Apply silicone border over primary dressing as directed. Secured With [employer], 4.5x3.1 (in/yd) Discharge Instruction: Secure with Kerlix as directed. 56M Medipore H Soft Cloth Surgical T 4 x 2 (in/yd) ape Discharge Instruction: Secure dressing with tape as directed. Compression Wrap Compression Stockings Add-Ons Electronic Signature(s) Signed: 01/07/2021 5:25:56 PM By: Linton Ham MD Signed: 01/08/2021 6:12:45 PM By: Rhae Hammock RN Entered By: Linton Ham on 01/07/2021 09:22:38 -------------------------------------------------------------------------------- Multi-Disciplinary Care Plan Details Patient Name: Date of Service: [name] [name] 01/07/2021 8:15 A M Medical Record Number: [id number] Patient Account Number: [id number] Date of  Birth/Sex: Treating RN: March 02, 1946 (74 y.o. [name], [name] Primary Care Joon Pohle: Consuello Masse Other Clinician: Referring Yael Coppess: Treating Tahmid Stonehocker/Extender: Zadie Cleverly in Treatment: 14 Multidisciplinary Care Plan reviewed with physician Active Inactive Nutrition Nursing Diagnoses: Impaired glucose control: actual or potential Goals: Patient/caregiver verbalizes understanding of need to maintain therapeutic glucose control per primary care physician Date Initiated: 09/27/2020 Target Resolution Date: 01/17/2021 Goal Status: Active Interventions: Assess HgA1c results as ordered upon admission and as needed Assess patient nutrition upon admission and as needed per policy Provide education on elevated blood sugars and impact on wound healing Provide education on nutrition Treatment Activities: Education provided on Nutrition : 12/03/2020 Notes: 10/25/20: Glucose control ongoing. Electronic Signature(s) Signed: 01/08/2021 6:12:45 PM By: Rhae Hammock RN Entered By: Rhae Hammock on 01/07/2021 08:58:15 -------------------------------------------------------------------------------- Pain Assessment Details Patient Name: Date of Service: Willis, BA RRY G. 01/07/2021 8:15 A M Medical Record Number: [id number] Patient Account Number: [id number] Date of Birth/Sex: Treating RN: December 25, 1945 (75 y.o. [name] Primary Care Josede Cicero: Consuello Masse Other Clinician: Referring Phil Corti: Treating Librada Castronovo/Extender: Zadie Cleverly in Treatment: 14 Active Problems Location of Pain Severity and Description of Pain Patient Has Paino No Site Locations Rate the pain. Current Pain Level: 0 Pain Management and  Medication Current Pain Management: Medication: No Cold Application: No Rest: No Massage: No Activity: No T.E.N.S.: No Heat Application: No Leg drop or elevation: No Is the Current Pain Management Adequate: Adequate How  does your wound impact your activities of daily livingo Sleep: No Bathing: No Appetite: No Relationship With Others: No Bladder Continence: No Emotions: No Bowel Continence: No Work: No Toileting: No Drive: No Dressing: No Hobbies: No Electronic Signature(s) Signed: 01/07/2021 6:16:29 PM By: Deon Pilling Entered By: Deon Pilling on 01/07/2021 08:35:21 -------------------------------------------------------------------------------- Patient/Caregiver Education Details Patient Name: Date of Service: Sills, BA RRY G. 7/12/2022andnbsp8:15 A M Medical Record Number: [id number] Patient Account Number: [id number] Date of Birth/Gender: Treating RN: 1946-05-09 (75 y.o. [name] Primary Care Physician: Consuello Masse Other Clinician: Referring Physician: Treating Physician/Extender: Zadie Cleverly in Treatment: 14 Education Assessment Education Provided To: Patient Education Topics Provided Elevated Blood Sugar/ Impact on Healing: Methods: Explain/Verbal Responses: State content correctly Nutrition: Methods: Explain/Verbal Responses: State content correctly Electronic Signature(s) Signed: 01/08/2021 6:12:45 PM By: Rhae Hammock RN Entered By: Rhae Hammock on 01/07/2021 08:58:53 -------------------------------------------------------------------------------- Wound Assessment Details Patient Name: Date of Service: [name] [name] 01/07/2021 8:15 A M Medical Record Number: [id number] Patient Account Number: [id number] Date of Birth/Sex: Treating RN: 1946-03-01 (75 y.o. [name] Primary Care Jabes Primo: Consuello Masse Other Clinician: Referring Antoinne Spadaccini: Treating Kristoffer Bala/Extender: Zadie Cleverly in Treatment: 14 Wound Status Wound Number: 1 Primary Diabetic Wound/Ulcer of the Lower Extremity Etiology: Wound Location: Left, Plantar Metatarsal head fifth Wound Open Wounding Event: Gradually  Appeared Status: Date Acquired: 06/29/2020 Comorbid Sleep Apnea, Arrhythmia, Congestive Heart Failure, Coronary Weeks Of Treatment: 14 History: Artery Disease, Type II Diabetes, Osteoarthritis, Neuropathy Clustered Wound: No Photos Wound Measurements Length: (cm) 0.9 Width: (cm) 2.1 Depth: (cm) 1.1 Area: (cm) 1.484 Volume: (cm) 1.633 % Reduction in Area: -1990.1% % Reduction in Volume: -7676.2% Epithelialization: None Tunneling: No Undermining: Yes Starting Position (o'clock): 12 Ending Position (o'clock): 12 Maximum Distance: (cm) 0.8 Wound Description Classification: Grade 3 Wound Margin: Well defined, not attached Exudate Amount: Medium Exudate Type: Serosanguineous Exudate Color: red, brown Foul Odor After Cleansing: No Slough/Fibrino Yes Wound Bed Granulation Amount: Medium (34-66%) Exposed Structure Granulation Quality: Red Fascia Exposed: No Necrotic Amount: Medium (34-66%) Fat Layer (Subcutaneous Tissue) Exposed: Yes Necrotic Quality: Adherent Slough Tendon Exposed: Yes Muscle Exposed: No Joint Exposed: No Bone Exposed: Yes Assessment Notes hard fragment noted in wound bed. Treatment Notes Wound #1 (Metatarsal head fifth) Wound Laterality: Plantar, Left Cleanser Soap and Water Discharge Instruction: May shower and wash wound with dial antibacterial soap and water prior to dressing change. Wound Cleanser Discharge Instruction: Cleanse the wound with wound cleanser prior to applying a clean dressing using gauze sponges, not tissue or cotton balls. Peri-Wound Care Topical Primary Dressing KerraCel Ag Gelling Fiber Dressing, 4x5 in (silver alginate) Discharge Instruction: Apply silver alginate to wound bed as instructed Secondary Dressing Woven Gauze Sponges 2x2 in Discharge Instruction: Apply over primary dressing as directed. Zetuvit Plus Silicone Border Dressing 4x4 (in/in) Discharge Instruction: Apply silicone border over primary dressing as  directed. Secured With [employer], 4.5x3.1 (in/yd) Discharge Instruction: Secure with Kerlix as directed. 90M Medipore H Soft Cloth Surgical T 4 x 2 (in/yd) ape Discharge Instruction: Secure dressing with tape as directed. Compression Wrap Compression Stockings Add-Ons Electronic Signature(s) Signed: 01/07/2021 6:16:29 PM By: Deon Pilling Signed: 01/13/2021 3:49:49 PM By: Sandre Kitty Entered By: Sandre Kitty on 01/07/2021 16:48:18 -------------------------------------------------------------------------------- Vitals Details Patient Name: Date of Service:  [place], [place] G. 01/07/2021 8:15 A M Medical Record Number: [id number] Patient Account Number: [id number] Date of Birth/Sex: Treating RN: 09-11-1945 (75 y.o. [name] Primary Care Mckynzi Cammon: Consuello Masse Other Clinician: Referring Reema Chick: Treating Zacherie Honeyman/Extender: Zadie Cleverly in Treatment: 14 Vital Signs Time Taken: 08:30 Temperature (F): 97.5 Height (in): 74 Pulse (bpm): 64 Weight (lbs): 238 Respiratory Rate (breaths/min): 18 Body Mass Index (BMI): 30.6 Blood Pressure (mmHg): 111/67 Capillary Blood Glucose (mg/dl): 103 Reference Range: 80 - 120 mg / dl Electronic Signature(s) Signed: 01/07/2021 6:16:29 PM By: Deon Pilling Signed: 01/07/2021 6:16:29 PM By: Deon Pilling Entered By: Deon Pilling on 01/07/2021 08:35:09

## 2021-01-14 ENCOUNTER — Encounter (HOSPITAL_BASED_OUTPATIENT_CLINIC_OR_DEPARTMENT_OTHER): Payer: Medicare Other | Admitting: Internal Medicine

## 2021-01-14 ENCOUNTER — Other Ambulatory Visit: Payer: Self-pay

## 2021-01-14 DIAGNOSIS — I509 Heart failure, unspecified: Secondary | ICD-10-CM | POA: Diagnosis not present

## 2021-01-14 DIAGNOSIS — L97526 Non-pressure chronic ulcer of other part of left foot with bone involvement without evidence of necrosis: Secondary | ICD-10-CM | POA: Diagnosis not present

## 2021-01-14 DIAGNOSIS — E11621 Type 2 diabetes mellitus with foot ulcer: Secondary | ICD-10-CM | POA: Diagnosis not present

## 2021-01-14 DIAGNOSIS — E1151 Type 2 diabetes mellitus with diabetic peripheral angiopathy without gangrene: Secondary | ICD-10-CM | POA: Diagnosis not present

## 2021-01-14 DIAGNOSIS — I110 Hypertensive heart disease with heart failure: Secondary | ICD-10-CM | POA: Diagnosis not present

## 2021-01-14 DIAGNOSIS — L97528 Non-pressure chronic ulcer of other part of left foot with other specified severity: Secondary | ICD-10-CM | POA: Diagnosis not present

## 2021-01-14 DIAGNOSIS — I255 Ischemic cardiomyopathy: Secondary | ICD-10-CM | POA: Diagnosis not present

## 2021-01-14 NOTE — Progress Notes (Addendum)
[name] [name], [name] [name] ([id number]) Visit Report for 01/14/2021 Arrival Information Details Patient Name: Date of Service: [name] [name], [name] [name] 01/14/2021 9:30 A M Medical Record Number: [id number] Patient Account Number: [id number] Date of Birth/Sex: Treating RN: Jun 11, 1946 (75 y.o. [name] Primary Care Kelsen Celona: Consuello Masse Other Clinician: Referring Nolan Tuazon: Treating Damacio Weisgerber/Extender: Zadie Cleverly in Treatment: 15 Visit Information History Since Last Visit Added or deleted any medications: No Patient Arrived: Ambulatory Any new allergies or adverse reactions: No Arrival Time: 09:31 Had a fall or experienced change in No Accompanied By: daughter activities of daily living that may affect Transfer Assistance: None risk of falls: Patient Identification Verified: Yes Signs or symptoms of abuse/neglect since last No Secondary Verification Process Completed: Yes visito Patient Requires Transmission-Based Precautions: No Hospitalized since last visit: No Patient Has Alerts: No Implantable device outside of the clinic No excluding cellular tissue based products placed in the center since last visit: Has Dressing in Place as Prescribed: Yes Has Footwear/Offloading in Place as Yes Prescribed: Left: Removable Cast Walker/Walking Boot Pain Present Now: No Electronic Signature(s) Signed: 01/14/2021 5:33:00 PM By: Lorrin Jackson Entered By: Lorrin Jackson on 01/14/2021 09:36:08 -------------------------------------------------------------------------------- Clinic Level of Care Assessment Details Patient Name: Date of Service: [name] [name], [name] [name] 01/14/2021 9:30 A M Medical Record Number: [id number] Patient Account Number: [id number] Date of Birth/Sex: Treating RN: Mar 25, 1946 (75 y.o. [name], [name] Primary Care Oryan Winterton: Consuello Masse Other Clinician: Referring Tashanda Fuhrer: Treating Alan Drummer/Extender: Zadie Cleverly in Treatment:  15 Clinic Level of Care Assessment Items TOOL 4 Quantity Score X- 1 0 Use when only an EandM is performed on FOLLOW-UP visit ASSESSMENTS - Nursing Assessment / Reassessment X- 1 10 Reassessment of Co-morbidities (includes updates in patient status) X- 1 5 Reassessment of Adherence to Treatment Plan ASSESSMENTS - Wound and Skin A ssessment / Reassessment X - Simple Wound Assessment / Reassessment - one wound 1 5 []  - 0 Complex Wound Assessment / Reassessment - multiple wounds X- 1 10 Dermatologic / Skin Assessment (not related to wound area) ASSESSMENTS - Focused Assessment X- 1 5 Circumferential Edema Measurements - multi extremities []  - 0 Nutritional Assessment / Counseling / Intervention []  - 0 Lower Extremity Assessment (monofilament, tuning fork, pulses) []  - 0 Peripheral Arterial Disease Assessment (using hand held doppler) ASSESSMENTS - Ostomy and/or Continence Assessment and Care []  - 0 Incontinence Assessment and Management []  - 0 Ostomy Care Assessment and Management (repouching, etc.) PROCESS - Coordination of Care X - Simple Patient / Family Education for ongoing care 1 15 []  - 0 Complex (extensive) Patient / Family Education for ongoing care X- 1 10 Staff obtains Programmer, systems, Records, T Results / Process Orders est []  - 0 Staff telephones HHA, Nursing Homes / Clarify orders / etc []  - 0 Routine Transfer to another Facility (non-emergent condition) []  - 0 Routine Hospital Admission (non-emergent condition) []  - 0 New Admissions / Biomedical engineer / Ordering NPWT Apligraf, etc. , []  - 0 Emergency Hospital Admission (emergent condition) X- 1 10 Simple Discharge Coordination []  - 0 Complex (extensive) Discharge Coordination PROCESS - Special Needs []  - 0 Pediatric / Minor Patient Management []  - 0 Isolation Patient Management []  - 0 Hearing / Language / Visual special needs []  - 0 Assessment of Community assistance (transportation, D/C  planning, etc.) []  - 0 Additional assistance / Altered mentation []  - 0 Support Surface(s) Assessment (bed, cushion, seat, etc.) INTERVENTIONS - Wound Cleansing / Measurement X - Simple Wound Cleansing - one  wound 1 5 []  - 0 Complex Wound Cleansing - multiple wounds X- 1 5 Wound Imaging (photographs - any number of wounds) []  - 0 Wound Tracing (instead of photographs) X- 1 5 Simple Wound Measurement - one wound []  - 0 Complex Wound Measurement - multiple wounds INTERVENTIONS - Wound Dressings X - Small Wound Dressing one or multiple wounds 1 10 []  - 0 Medium Wound Dressing one or multiple wounds []  - 0 Large Wound Dressing one or multiple wounds X- 1 5 Application of Medications - topical []  - 0 Application of Medications - injection INTERVENTIONS - Miscellaneous []  - 0 External ear exam []  - 0 Specimen Collection (cultures, biopsies, blood, body fluids, etc.) []  - 0 Specimen(s) / Culture(s) sent or taken to Lab for analysis []  - 0 Patient Transfer (multiple staff / Civil Service fast streamer / Similar devices) []  - 0 Simple Staple / Suture removal (25 or less) []  - 0 Complex Staple / Suture removal (26 or more) []  - 0 Hypo / Hyperglycemic Management (close monitor of Blood Glucose) []  - 0 Ankle / Brachial Index (ABI) - do not check if billed separately X- 1 5 Vital Signs Has the patient been seen at the hospital within the last three years: Yes Total Score: 105 Level Of Care: New/Established - Level 3 Electronic Signature(s) Signed: 01/14/2021 5:32:12 PM By: Rhae Hammock RN Entered By: Rhae Hammock on 01/14/2021 11:22:34 -------------------------------------------------------------------------------- Complex / Palliative Patient Assessment Details Patient Name: Date of Service: [name] [name] 01/14/2021 9:30 A M Medical Record Number: [id number] Patient Account Number: [id number] Date of Birth/Sex: Treating RN: 12/10/1945 (75 y.o. Ernestene Mention Primary Care  Parks Czajkowski: Consuello Masse Other Clinician: Referring Lavayah Vita: Treating Darrius Montano/Extender: Zadie Cleverly in Treatment: 15 Palliative Management Criteria Complex Wound Management Criteria Patient has remarkable or complex co-morbidities requiring medications or treatments that extend wound healing times. Examples: Diabetes mellitus with chronic renal failure or end stage renal disease requiring dialysis Advanced or poorly controlled rheumatoid arthritis Diabetes mellitus and end stage chronic obstructive pulmonary disease Active cancer with current chemo- or radiation therapy DM, osteomyelitis, PAD, to start HBOT Care Approach Wound Care Plan: Complex Wound Management Electronic Signature(s) Signed: 01/20/2021 4:44:37 PM By: Baruch Gouty RN, BSN Signed: 01/22/2021 4:50:28 PM By: Linton Ham MD Entered By: Baruch Gouty on 01/20/2021 16:40:26 -------------------------------------------------------------------------------- Encounter Discharge Information Details Patient Name: Date of Service: [name] [name] 01/14/2021 9:30 A M Medical Record Number: [id number] Patient Account Number: [id number] Date of Birth/Sex: Treating RN: 07/07/1945 (74 y.o. [name] Primary Care Jamesina Gaugh: Consuello Masse Other Clinician: Referring Lyvia Mondesir: Treating Kamsiyochukwu Buist/Extender: Zadie Cleverly in Treatment: 15 Encounter Discharge Information Items Discharge Condition: Stable Ambulatory Status: Ambulatory Discharge Destination: Home Transportation: Private Auto Accompanied By: Daughter Schedule Follow-up Appointment: Yes Clinical Summary of Care: Provided on 01/14/2021 Form Type Recipient Paper Patient Patient Electronic Signature(s) Signed: 01/14/2021 10:09:51 AM By: Lorrin Jackson Entered By: Lorrin Jackson on 01/14/2021 10:09:51 -------------------------------------------------------------------------------- Lower Extremity Assessment  Details Patient Name: Date of Service: [name] [name] 01/14/2021 9:30 A M Medical Record Number: [id number] Patient Account Number: [id number] Date of Birth/Sex: Treating RN: [birth year] (75 y.o. [name] Primary Care Journie Howson: Consuello Masse Other Clinician: Referring Ariadne Rissmiller: Treating Arvo Ealy/Extender: Zadie Cleverly in Treatment: 15 Edema Assessment Assessed: Shirlyn Goltz: Yes] Patrice Paradise: No] Edema: [Left: Ye] [Right: s] Calf Left: Right: Point of Measurement: 40 cm From Medial Instep 37 cm Ankle Left: Right: Point of Measurement: 10 cm From Medial Instep  25 cm Vascular Assessment Pulses: Dorsalis Pedis Palpable: [Left:Yes] Electronic Signature(s) Signed: 01/14/2021 5:33:00 PM By: Lorrin Jackson Entered By: Lorrin Jackson on 01/14/2021 09:39:14 -------------------------------------------------------------------------------- Multi Wound Chart Details Patient Name: Date of Service: [name] [name] 01/14/2021 9:30 A M Medical Record Number: [id number] Patient Account Number: [id number] Date of Birth/Sex: Treating RN: 1946-05-08 (75 y.o. [name], [name] Primary Care Giselle Brutus: Consuello Masse Other Clinician: Referring Paulanthony Gleaves: Treating Indi Willhite/Extender: Zadie Cleverly in Treatment: 15 Vital Signs Height(in): 74 Capillary Blood Glucose(mg/dl): 111 Weight(lbs): 238 Pulse(bpm): 28 Body Mass Index(BMI): 31 Blood Pressure(mmHg): 106/66 Temperature(F): 98.2 Respiratory Rate(breaths/min): 18 Photos: [1:No Photos Left, Plantar Metatarsal head fifth N/A] [N/A:N/A] Wound Location: [1:Gradually Appeared] [N/A:N/A] Wounding Event: [1:Diabetic Wound/Ulcer of the Lower] [N/A:N/A] Primary Etiology: [1:Extremity Sleep Apnea, Arrhythmia, Congestive N/A] Comorbid History: [1:Heart Failure, Coronary Artery Disease, Type II Diabetes, Osteoarthritis, Neuropathy 06/29/2020] [N/A:N/A] Date Acquired: [1:15] [N/A:N/A] Weeks of Treatment:  [1:Open] [N/A:N/A] Wound Status: [1:1x2x0.4] [N/A:N/A] Measurements L x W x D (cm) [1:1.571] [N/A:N/A] A (cm) : rea [1:0.628] [N/A:N/A] Volume (cm) : [1:-2112.70%] [N/A:N/A] % Reduction in A rea: [1:-2890.50%] [N/A:N/A] % Reduction in Volume: [1:9] Position 1 (o'clock): [1:0.6] Maximum Distance 1 (cm): [1:Yes] [N/A:N/A] Tunneling: [1:Grade 3] [N/A:N/A] Classification: [1:Medium] [N/A:N/A] Exudate A mount: [1:Serosanguineous] [N/A:N/A] Exudate Type: [1:red, brown] [N/A:N/A] Exudate Color: [1:Well defined, not attached] [N/A:N/A] Wound Margin: [1:Large (67-100%)] [N/A:N/A] Granulation A mount: [1:Red] [N/A:N/A] Granulation Quality: [1:Small (1-33%)] [N/A:N/A] Necrotic A mount: [1:Fat Layer (Subcutaneous Tissue): Yes N/A] Exposed Structures: [1:Tendon: Yes Bone: Yes Fascia: No Muscle: No Joint: No None] [N/A:N/A] Epithelialization: [1:slight maceration] [N/A:N/A] Treatment Notes Wound #1 (Metatarsal head fifth) Wound Laterality: Plantar, Left Cleanser Soap and Water Discharge Instruction: May shower and wash wound with dial antibacterial soap and water prior to dressing change. Wound Cleanser Discharge Instruction: Cleanse the wound with wound cleanser prior to applying a clean dressing using gauze sponges, not tissue or cotton balls. Peri-Wound Care Topical Primary Dressing KerraCel Ag Gelling Fiber Dressing, 4x5 in (silver alginate) Discharge Instruction: Apply silver alginate to wound bed as instructed Secondary Dressing Woven Gauze Sponges 2x2 in Discharge Instruction: Apply over primary dressing as directed. Zetuvit Plus Silicone Border Dressing 4x4 (in/in) Discharge Instruction: Apply silicone border over primary dressing as directed. Secured With [employer], 4.5x3.1 (in/yd) Discharge Instruction: Secure with Kerlix as directed. 49M Medipore H Soft Cloth Surgical T 4 x 2 (in/yd) ape Discharge Instruction: Secure dressing with tape as directed. Compression  Wrap Compression Stockings Add-Ons Electronic Signature(s) Signed: 01/14/2021 5:24:30 PM By: Linton Ham MD Signed: 01/14/2021 5:32:12 PM By: Rhae Hammock RN Entered By: Linton Ham on 01/14/2021 10:10:21 -------------------------------------------------------------------------------- Multi-Disciplinary Care Plan Details Patient Name: Date of Service: [name] [name] 01/14/2021 9:30 A M Medical Record Number: [id number] Patient Account Number: [id number] Date of Birth/Sex: Treating RN: [birth year] (74 y.o. [name], [name] Primary Care Rhian Funari: Consuello Masse Other Clinician: Referring Chisom Muntean: Treating Dragon Thrush/Extender: Zadie Cleverly in Treatment: 15 Multidisciplinary Care Plan reviewed with physician Active Inactive Nutrition Nursing Diagnoses: Impaired glucose control: actual or potential Goals: Patient/caregiver verbalizes understanding of need to maintain therapeutic glucose control per primary care physician Date Initiated: 09/27/2020 Target Resolution Date: 01/17/2021 Goal Status: Active Interventions: Assess HgA1c results as ordered upon admission and as needed Assess patient nutrition upon admission and as needed per policy Provide education on elevated blood sugars and impact on wound healing Provide education on nutrition Treatment Activities: Education provided on Nutrition : 01/07/2021 Notes: 10/25/20: Glucose control ongoing. Electronic Signature(s) Signed: 01/14/2021  5:32:12 PM By: Rhae Hammock RN Entered By: Rhae Hammock on 01/14/2021 10:02:21 -------------------------------------------------------------------------------- Pain Assessment Details Patient Name: Date of Service: [name] [name] 01/14/2021 9:30 A M Medical Record Number: [id number] Patient Account Number: [id number] Date of Birth/Sex: Treating RN: [birth year] (75 y.o. [name] Primary Care Jacqulyn Barresi: Consuello Masse Other  Clinician: Referring Alanzo Lamb: Treating Candia Kingsbury/Extender: Zadie Cleverly in Treatment: 15 Active Problems Location of Pain Severity and Description of Pain Patient Has Paino No Site Locations Pain Management and Medication Current Pain Management: Electronic Signature(s) Signed: 01/14/2021 5:33:00 PM By: Lorrin Jackson Entered By: Lorrin Jackson on 01/14/2021 09:36:50 -------------------------------------------------------------------------------- Patient/Caregiver Education Details Patient Name: Date of Service: [name] [name] 7/19/2022andnbsp9:30 A M Medical Record Number: [id number] Patient Account Number: [id number] Date of Birth/Gender: Treating RN: Nov 26, 1945 (75 y.o. [name] Primary Care Physician: Consuello Masse Other Clinician: Referring Physician: Treating Physician/Extender: Zadie Cleverly in Treatment: 15 Education Assessment Education Provided To: Patient Education Topics Provided Nutrition: Methods: Explain/Verbal Responses: State content correctly Electronic Signature(s) Signed: 01/14/2021 5:32:12 PM By: Rhae Hammock RN Entered By: Rhae Hammock on 01/14/2021 10:02:37 -------------------------------------------------------------------------------- Wound Assessment Details Patient Name: Date of Service: [name] [name] 01/14/2021 9:30 A M Medical Record Number: [id number] Patient Account Number: [id number] Date of Birth/Sex: Treating RN: [birth year] (74 y.o. [name] Primary Care Jhanvi Drakeford: Consuello Masse Other Clinician: Referring Minie Roadcap: Treating Duanne Duchesne/Extender: Zadie Cleverly in Treatment: 15 Wound Status Wound Number: 1 Primary Diabetic Wound/Ulcer of the Lower Extremity Etiology: Wound Location: Left, Plantar Metatarsal head fifth Wound Open Wounding Event: Gradually Appeared Status: Date Acquired: 06/29/2020 Comorbid Sleep Apnea, Arrhythmia,  Congestive Heart Failure, Coronary Weeks Of Treatment: 15 History: Artery Disease, Type II Diabetes, Osteoarthritis, Neuropathy Clustered Wound: No Photos Wound Measurements Length: (cm) 1 Width: (cm) 2 Depth: (cm) 0.4 Area: (cm) 1.571 Volume: (cm) 0.628 % Reduction in Area: -2112.7% % Reduction in Volume: -2890.5% Epithelialization: None Tunneling: Yes Position (o'clock): 9 Maximum Distance: (cm) 0.6 Undermining: No Wound Description Classification: Grade 3 Wound Margin: Well defined, not attached Exudate Amount: Medium Exudate Type: Serosanguineous Exudate Color: red, brown Foul Odor After Cleansing: No Slough/Fibrino Yes Wound Bed Granulation Amount: Large (67-100%) Exposed Structure Granulation Quality: Red Fascia Exposed: No Necrotic Amount: Small (1-33%) Fat Layer (Subcutaneous Tissue) Exposed: Yes Necrotic Quality: Adherent Slough Tendon Exposed: Yes Muscle Exposed: No Joint Exposed: No Bone Exposed: Yes Assessment Notes slight maceration Electronic Signature(s) Signed: 01/14/2021 5:17:35 PM By: Sandre Kitty Signed: 01/14/2021 5:33:00 PM By: Lorrin Jackson Entered By: Sandre Kitty on 01/14/2021 16:57:00 -------------------------------------------------------------------------------- Vitals Details Patient Name: Date of Service: [name] [name] 01/14/2021 9:30 A M Medical Record Number: [id number] Patient Account Number: [id number] Date of Birth/Sex: Treating RN: December 12, 1945 (74 y.o. [name] Primary Care Lyndol Vanderheiden: Consuello Masse Other Clinician: Referring Ellisha Bankson: Treating Albirtha Grinage/Extender: Zadie Cleverly in Treatment: 15 Vital Signs Time Taken: 09:36 Temperature (F): 98.2 Height (in): 74 Pulse (bpm): 59 Weight (lbs): 238 Respiratory Rate (breaths/min): 18 Body Mass Index (BMI): 30.6 Blood Pressure (mmHg): 106/66 Capillary Blood Glucose (mg/dl): 111 Reference Range: 80 - 120 mg / dl Electronic  Signature(s) Signed: 01/14/2021 5:33:00 PM By: Lorrin Jackson Entered By: Lorrin Jackson on 01/14/2021 09:36:43

## 2021-01-15 DIAGNOSIS — E1169 Type 2 diabetes mellitus with other specified complication: Secondary | ICD-10-CM | POA: Diagnosis not present

## 2021-01-15 DIAGNOSIS — E11621 Type 2 diabetes mellitus with foot ulcer: Secondary | ICD-10-CM | POA: Diagnosis not present

## 2021-01-15 DIAGNOSIS — L03116 Cellulitis of left lower limb: Secondary | ICD-10-CM | POA: Diagnosis not present

## 2021-01-15 DIAGNOSIS — M869 Osteomyelitis, unspecified: Secondary | ICD-10-CM | POA: Diagnosis not present

## 2021-01-15 DIAGNOSIS — L97528 Non-pressure chronic ulcer of other part of left foot with other specified severity: Secondary | ICD-10-CM | POA: Diagnosis not present

## 2021-01-15 DIAGNOSIS — E1142 Type 2 diabetes mellitus with diabetic polyneuropathy: Secondary | ICD-10-CM | POA: Diagnosis not present

## 2021-01-15 NOTE — Progress Notes (Signed)
[name] [name], [name] [name] ([id number]) Visit Report for 01/14/2021 HPI Details Patient Name: Date of Service: [name] [name], [name] [name] 01/14/2021 9:30 A M Medical Record Number: [id number] Patient Account Number: [id number] Date of Birth/Sex: Treating RN: Oct 19, 1945 (75 y.o. [name] Primary Care Provider: Consuello Masse Other Clinician: Referring Provider: Treating Provider/Extender: Zadie Cleverly in Treatment: 15 History of Present Illness HPI Description: ADMISSION 09/27/2020 This is a 75 year old man who lives in [place]. He apparently has had callus over the plantar fifth metatarsal head in the past for which she is followed by podiatry in [place]. They shaved down the callus and this was done in January. He states that he went to [place] in January and became aware of when he was there of an open wound in this area. He followed up with podiatry and has been soaking this twice a day with Epson salts and using Silvadene. Apparently one of the podiatrist told him he may need surgery because of bone protrusion. Past medical history includes an ischemic cardiomyopathy, type 2 diabetes with peripheral neuropathy, BPH, PVD, orthostatic hypotension, atrial fibrillation, hyperlipidemia and hypertension Arterial studies in 2018 showed a noncompressible ABI on the left. It was again noncompressible today at 1.7 although his pulses are easily palpable 4/8; patient I admitted to the clinic last week. Wound on the plantar left fifth metatarsal head which has been refractory. He also has a bit of subluxation of the bone in the head. He is a type II diabetic with peripheral neuropathy. We used silver collagen after debridement He arrives back in clinic today. He has erythema spreading into the lateral part of the fifth metatarsal head. I removed some undermining tissue from around the wound and clearly there is a connection between the wound and the lateral part of the fifth met  head. A culture was done. Area of erythema on the lateral part of the foot was marked. His wife says that has been there since Wednesday 4/15; the patient arrived last week with erythema spreading in the lateral part of the fifth metatarsal head. There was a wound connected with the plantar fifth met head original wound. I gave him empiric Augmentin. Surprisingly the culture I did was negative. We have been using silver alginate. The wound has been open since January. He arrives in clinic today with the erythema much better 4/22; patient presents today for 1 week follow-up of his fifth metatarsal head wound. Daughter is present with the patient. She states that the wound has improved significantly since 2 weeks ago. There is no longer redness to the foot and the actual wound bed is smaller. Patient overall feels well and has no complaints today. 4/29; patient presents for 1 week follow-up of his fifth metatarsal head wound. Daughter is present. Patient has been using silver alginate every other day to the wound site. He has tried to relieve pressure with a surgical shoe. He denies signs of infections. 5/6; fifth metatarsal head wound. He has been using silver alginate changed to Santyl last week which he managed to obtain for $21 with a coupon we gave him. He also has an appointment with vein and vascular. I think Dr. Heber [place] referred him. He has noncompressible ABIs. The patient also had an x-ray and that was negative 5/13; the patient went to see [name]. Areas on the left fifth metatarsal head plantar and lateral. [name] noted that his ABI in the right was 0.99 but monophasic waveforms with a TBI at 0.32.  He wanted to go ahead with an angiogram which is booked for Thursday. I talked to the patient about this and advised him to go forward with this. 5/23; patient presents for 1 week follow-up. He has been using silver alginate to the wound bed. He has no complaints today. Denies signs of  infection. He had an arteriogram done on 5/19. 5/31; angiogram showed patent common femoral, SFA, profunda, above-knee popliteal artery. 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 He does have inline flow down the left lower extremity through the peroneal that is widely patent. The peroneal gave off collaterals distally to the anterior tibial that feels retrograde at the ankle. An attempted recanalization of the left anterior tibial artery was made but was not successful. The plan as outlined by [name] is that he will continue getting aggressive wound care and he would consider a retrograde left anterior tibial intervention if there is no improvement. Wound itself does not look as though it is making any improvement. There is still tunneling superiorly precariously close to bone 6/3; back for the obligatory first total contact cast change. The wound clearly has exposed bone. He is a type II diabetic. He has been revascularized. Plain x- ray did not show osteomyelitis he may require an MRI. After his revascularization I elected to put him into a cast to make sure that all the pressure was coming off this area 6/7; patient back for his routine visit. The wound is not doing well. The patient had a lot of discomfort since he was last here 4 days ago. No systemic illness. He will not go back in the total contact cast today. 6/14; culture I did last time showed Enterobacter. At the time there was erythema and swelling I put him on Augmentin and Bactrim empirically. The Enterobacter was sensitive to the Bactrim DS. He sees [name] tomorrow. I am looking for her recommendation about antibiotic choices and route of administration. He sees [name] of vascular surgery on 6/21. I have communicated with [name] I would like to see whether or not further revascularization will be entertained and whether [name] feels  that he could survive a foot conserving surgery if that is the route the patient wishes to go. After these next 2 consultations we may be able to discuss the route the patient wants to take either a surgical 1 or perhaps 1 that involves a prolonged course of antibiotics and hyperbaric oxygen. I touch bases with the patient and his daughter on all of this. MRI suggested osteomyelitis in the distal 2 cm of the fifth metatarsal 6/24; the patient was kindly seen by [name] of infectious disease by telehealth visit. He has established a PICC line and is on a Carbapenem for 4 weeks although I am not really sure which 1. Initial culture was Enterobacter I believe this was a swab. He is also followed up with [name]. He again is considering an anterograde anterior tibial attempted revascularization. The patient is to call if they agree to move forward with the anterograde attempt. He started an IV antibiotics already. He arrives in clinic today with again the wound deteriorating. There is exposed bone widely which is quite a bit worse than last week. I did obtain a piece of this for culture but more bone debridement is likely going to need to be done. I had an extensive discussion with the patient and his daughter.  The problem is the ray amputation success depends largely on blood flow. [name] did not sound optimistic about this. However I would definitely go ahead with the retrograde attempt by [name] even though there are potential risks. I explained this to the patient. If we are going to have any attempt to salvage this man's foot we are going to need blood flow and I would go ahead with this. IV antibiotics in the meantime and consideration of hyperbaric oxygen all of these things I discussed. The option would be a below-knee amputation which the patient does not seem ready for his daughter is certainly not ready for. But we also talked about this 6/30; bone culture I did last week showed the  same Enterobacter cloacae I as previous. Also rare Staph epidermidis. I am not really sure of the significance of this which may be a contamination. He has a anterior grade access attempt by [name] of vein and vascular on 7/7. After we see the result of this I will go over his options which are continued antibiotics and hyperbarics versus an attempt at a ray amputation if [name] is successful in establishing more blood flow here. 7/12 the patient saw [name] yesterday who extended his IV antibiotics. He also had an attempt at left anterior tibial artery retrograde revascularization by [name] however this was not successful. We are therefore looking at the same blood flow that we had previously. I had an extensive discussion with him today. I think he has a good chance that healing ray amputation. The other option is to continue his IV antibiotics and initiate hyperbaric oxygen therapy. The patient seems to be opting for the latter 7/19; continues on IV antibiotics per the patient until August 3. He is going to wait to [facility] sometime after that. He is approved for HBO but I do not think we will be able to get that going until sometime in the August 20 21st range per he has been using silver alginate Electronic Signature(s) Signed: 01/14/2021 5:24:30 PM By: Linton Ham MD Entered By: Linton Ham on 01/14/2021 10:11:24 -------------------------------------------------------------------------------- Physical Exam Details Patient Name: Date of Service: [name] [name] 01/14/2021 9:30 A M Medical Record Number: [id number] Patient Account Number: [id number] Date of Birth/Sex: Treating RN: 02-Dec-1945 (75 y.o. [name] Primary Care Provider: Consuello Masse Other Clinician: Referring Provider: Treating Provider/Extender: Zadie Cleverly in Treatment: 15 Constitutional Sitting or standing Blood Pressure is within target range for patient..  Pulse regular and within target range for patient.Marland Kitchen Respirations regular, non-labored and within target range.. Temperature is normal and within the target range for the patient.Marland Kitchen Appears in no distress. Notes Wound exam; in general the wound surface appears better. This is on the left lateral fifth metatarsal head. The real problem is in the most posterior one third of the wound surface area. Here the tissue is not as vibrant and there is depth but again this does not easily probe to bone this week. That is a nice improvement no purulence no surrounding Electronic Signature(s) Signed: 01/14/2021 5:24:30 PM By: Linton Ham MD Entered By: Linton Ham on 01/14/2021 10:12:53 -------------------------------------------------------------------------------- Physician Orders Details Patient Name: Date of Service: [name] [name] 01/14/2021 9:30 A M Medical Record Number: [id number] Patient Account Number: [id number] Date of Birth/Sex: Treating RN: 1945-07-29 (74 y.o. [name], [name] Primary Care Provider: Consuello Masse Other Clinician: Referring Provider: Treating Provider/Extender: Zadie Cleverly in Treatment: 15 Verbal /  Phone Orders: No Diagnosis Coding Follow-up Appointments ppointment in 2 weeks. - Tuesday Dr. Dellia Nims Return A Other: - Dr. Baxter Flattery extending IV antibiotic for 2 more weeks Follow up with [facility] on July 26 Bathing/ Shower/ Hygiene May shower and wash wound with soap and water. - on days changing dressing. Do not soak foot. Edema Control - Lymphedema / SCD / Other Elevate legs to the level of the heart or above for 30 minutes daily and/or when sitting, a frequency of: - throughout the day Avoid standing for long periods of time. Exercise regularly Off-Loading Open toe surgical shoe to: - left foot Additional Orders / Instructions Follow Nutritious Diet Hyperbaric Oxygen Therapy Evaluate for HBO Therapy Indication: - Osteomyelitis of Right  lateral foot 2.0 ATA for 90 Minutes with 2 Five (5) Minute A Breaks ir Total Number of Treatments: - 40 One treatments per day (delivered Monday through Friday unless otherwise specified in Special Instructions below): Finger stick Blood Glucose Pre- and Post- HBOT Treatment. Follow Hyperbaric Oxygen Glycemia Protocol Afrin (Oxymetazoline HCL) 0.05% nasal spray - 1 spray in both nostrils daily as needed prior to HBO treatment for difficulty clearing ears Wound Treatment Wound #1 - Metatarsal head fifth Wound Laterality: Plantar, Left Cleanser: Soap and Water 1 x Per Day/30 Days Discharge Instructions: May shower and wash wound with dial antibacterial soap and water prior to dressing change. Cleanser: Wound Cleanser (DME) (Generic) 1 x Per Day/30 Days Discharge Instructions: Cleanse the wound with wound cleanser prior to applying a clean dressing using gauze sponges, not tissue or cotton balls. Prim Dressing: KerraCel Ag Gelling Fiber Dressing, 4x5 in (silver alginate) (DME) (Generic) 1 x Per Day/30 Days ary Discharge Instructions: Apply silver alginate to wound bed as instructed Secondary Dressing: Woven Gauze Sponges 2x2 in (DME) (Generic) 1 x Per Day/30 Days Discharge Instructions: Apply over primary dressing as directed. Secondary Dressing: Bordered Gauze, 2x3.75 in (DME) (Generic) 1 x Per Day/30 Days Discharge Instructions: Apply over primary dressing as directed. GLYCEMIA INTERVENTIONS PROTOCOL PRE-HBO GLYCEMIA INTERVENTIONS ACTION INTERVENTION Obtain pre-HBO capillary blood glucose (ensure 1 physician order is in chart). A. Notify HBO physician and await physician orders. 2 If result is 70 mg/dl or below: B. If the result meets the hospital definition of a critical result, follow hospital policy. A. Give patient an 8 ounce Glucerna Shake, an 8 ounce Ensure, or 8 ounces of a Glucerna/Ensure equivalent dietary supplement*. B. Wait 30 minutes. If result is 71 mg/dl to 130  mg/dl: C. Retest patients capillary blood glucose (CBG). D. If result greater than or equal to 110 mg/dl, proceed with HBO. If result less than 110 mg/dl, notify HBO physician and consider holding HBO. If result is 131 mg/dl to 249 mg/dl: A. Proceed with HBO. A. Notify HBO physician and await physician orders. B. It is recommended to hold HBO and do If result is 250 mg/dl or greater: blood/urine ketone testing. C. If the result meets the hospital definition of a critical result, follow hospital policy. POST-HBO GLYCEMIA INTERVENTIONS ACTION INTERVENTION Obtain post HBO capillary blood glucose (ensure 1 physician order is in chart). A. Notify HBO physician and await physician orders. 2 If result is 70 mg/dl or below: B. If the result meets the hospital definition of a critical result, follow hospital policy. A. Give patient an 8 ounce Glucerna Shake, an 8 ounce Ensure, or 8 ounces of a Glucerna/Ensure equivalent dietary supplement*. B. Wait 15 minutes for symptoms of If result is 71 mg/dl to 100 mg/dl: hypoglycemia (i.e.  nervousness, anxiety, sweating, chills, clamminess, irritability, confusion, tachycardia or dizziness). C. If patient asymptomatic, discharge patient. If patient symptomatic, repeat capillary blood glucose (CBG) and notify HBO physician. If result is 101 mg/dl to 249 mg/dl: A. Discharge patient. A. Notify HBO physician and await physician orders. B. It is recommended to do blood/urine ketone If result is 250 mg/dl or greater: testing. C. If the result meets the hospital definition of a critical result, follow hospital policy. *Juice or candies are NOT equivalent products. If patient refuses the Glucerna or Ensure, please consult the hospital dietitian for an appropriate substitute. Electronic Signature(s) Signed: 01/14/2021 5:32:12 PM By: Rhae Hammock RN Signed: 01/15/2021 4:58:30 PM By: Linton Ham MD Previous Signature: 01/14/2021 5:24:30 PM  Version By: Linton Ham MD Entered By: Rhae Hammock on 01/14/2021 17:28:16 -------------------------------------------------------------------------------- Problem List Details Patient Name: Date of Service: [name] [name] 01/14/2021 9:30 A M Medical Record Number: [id number] Patient Account Number: [id number] Date of Birth/Sex: Treating RN: 1945-08-02 (74 y.o. [name], [name] Primary Care Provider: Consuello Masse Other Clinician: Referring Provider: Treating Provider/Extender: Zadie Cleverly in Treatment: 15 Active Problems ICD-10 Encounter Code Description Active Date MDM Diagnosis E11.621 Type 2 diabetes mellitus with foot ulcer 09/27/2020 No Yes E11.51 Type 2 diabetes mellitus with diabetic peripheral angiopathy without gangrene 11/11/2020 No Yes L97.528 Non-pressure chronic ulcer of other part of left foot with other specified 09/27/2020 No Yes severity M86.272 Subacute osteomyelitis, left ankle and foot 12/10/2020 No Yes E11.42 Type 2 diabetes mellitus with diabetic polyneuropathy 09/27/2020 No Yes Inactive Problems ICD-10 Code Description Active Date Inactive Date L03.116 Cellulitis of left lower limb 10/04/2020 10/04/2020 Resolved Problems Electronic Signature(s) Signed: 01/14/2021 5:24:30 PM By: Linton Ham MD Entered By: Linton Ham on 01/14/2021 10:09:44 -------------------------------------------------------------------------------- Progress Note Details Patient Name: Date of Service: [name] [name] 01/14/2021 9:30 A M Medical Record Number: [id number] Patient Account Number: [id number] Date of Birth/Sex: Treating RN: 12/05/1945 (75 y.o. [name] Primary Care Provider: Consuello Masse Other Clinician: Referring Provider: Treating Provider/Extender: Zadie Cleverly in Treatment: 15 Subjective History of Present Illness (HPI) ADMISSION 09/27/2020 This is a 75 year old man who lives in [place]. He apparently has had callus over the plantar fifth metatarsal head in the past for which she is followed by podiatry in [place]. They shaved down the callus and this was done in January. He states that he went to [place] in January and became aware of when he was there of an open wound in this area. He followed up with podiatry and has been soaking this twice a day with Epson salts and using Silvadene. Apparently one of the podiatrist told him he may need surgery because of bone protrusion. Past medical history includes an ischemic cardiomyopathy, type 2 diabetes with peripheral neuropathy, BPH, PVD, orthostatic hypotension, atrial fibrillation, hyperlipidemia and hypertension Arterial studies in 2018 showed a noncompressible ABI on the left. It was again noncompressible today at 1.7 although his pulses are easily palpable 4/8; patient I admitted to the clinic last week. Wound on the plantar left fifth metatarsal head which has been refractory. He also has a bit of subluxation of the bone in the head. He is a type II diabetic with peripheral neuropathy. We used silver collagen after debridement He arrives back in clinic today. He has erythema spreading into the lateral part of the fifth metatarsal head. I removed some undermining tissue from around the wound and clearly there is a connection between the wound  and the lateral part of the fifth met head. A culture was done. Area of erythema on the lateral part of the foot was marked. His wife says that has been there since Wednesday 4/15; the patient arrived last week with erythema spreading in the lateral part of the fifth metatarsal head. There was a wound connected with the plantar fifth met head original wound. I gave him empiric Augmentin. Surprisingly the culture I did was negative. We have been using silver alginate. The wound has been open since January. He arrives in clinic today with the erythema much better 4/22; patient presents  today for 1 week follow-up of his fifth metatarsal head wound. Daughter is present with the patient. She states that the wound has improved significantly since 2 weeks ago. There is no longer redness to the foot and the actual wound bed is smaller. Patient overall feels well and has no complaints today. 4/29; patient presents for 1 week follow-up of his fifth metatarsal head wound. Daughter is present. Patient has been using silver alginate every other day to the wound site. He has tried to relieve pressure with a surgical shoe. He denies signs of infections. 5/6; fifth metatarsal head wound. He has been using silver alginate changed to Santyl last week which he managed to obtain for $21 with a coupon we gave him. He also has an appointment with vein and vascular. I think Dr. Heber [place] referred him. He has noncompressible ABIs. The patient also had an x-ray and that was negative 5/13; the patient went to see [name]. Areas on the left fifth metatarsal head plantar and lateral. [name] noted that his ABI in the right was 0.99 but monophasic waveforms with a TBI at 0.32. He wanted to go ahead with an angiogram which is booked for Thursday. I talked to the patient about this and advised him to go forward with this. 5/23; patient presents for 1 week follow-up. He has been using silver alginate to the wound bed. He has no complaints today. Denies signs of infection. He had an arteriogram done on 5/19. 5/31; angiogram showed patent common femoral, SFA, profunda, above-knee popliteal artery. 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 He does have inline flow down the left lower extremity through the peroneal that is widely patent. The peroneal gave off collaterals distally to the anterior tibial that feels retrograde at the ankle. An attempted recanalization of the left anterior tibial artery was made but was not  successful. The plan as outlined by [name] is that he will continue getting aggressive wound care and he would consider a retrograde left anterior tibial intervention if there is no improvement. Wound itself does not look as though it is making any improvement. There is still tunneling superiorly precariously close to bone 6/3; back for the obligatory first total contact cast change. The wound clearly has exposed bone. He is a type II diabetic. He has been revascularized. Plain x- ray did not show osteomyelitis he may require an MRI. After his revascularization I elected to put him into a cast to make sure that all the pressure was coming off this area 6/7; patient back for his routine visit. The wound is not doing well. The patient had a lot of discomfort since he was last here 4 days ago. No systemic illness. He will not go back in the total contact cast today. 6/14; culture I did last time showed Enterobacter.  At the time there was erythema and swelling I put him on Augmentin and Bactrim empirically. The Enterobacter was sensitive to the Bactrim DS. He sees [name] tomorrow. I am looking for her recommendation about antibiotic choices and route of administration. He sees [name] of vascular surgery on 6/21. I have communicated with [name] I would like to see whether or not further revascularization will be entertained and whether [name] feels that he could survive a foot conserving surgery if that is the route the patient wishes to go. After these next 2 consultations we may be able to discuss the route the patient wants to take either a surgical 1 or perhaps 1 that involves a prolonged course of antibiotics and hyperbaric oxygen. I touch bases with the patient and his daughter on all of this. MRI suggested osteomyelitis in the distal 2 cm of the fifth metatarsal 6/24; the patient was kindly seen by [name] of infectious disease by telehealth visit. He has established a PICC line  and is on a Carbapenem for 4 weeks although I am not really sure which 1. Initial culture was Enterobacter I believe this was a swab. He is also followed up with [name]. He again is considering an anterograde anterior tibial attempted revascularization. The patient is to call if they agree to move forward with the anterograde attempt. He started an IV antibiotics already. He arrives in clinic today with again the wound deteriorating. There is exposed bone widely which is quite a bit worse than last week. I did obtain a piece of this for culture but more bone debridement is likely going to need to be done. I had an extensive discussion with the patient and his daughter. The problem is the ray amputation success depends largely on blood flow. [name] did not sound optimistic about this. However I would definitely go ahead with the retrograde attempt by [name] even though there are potential risks. I explained this to the patient. If we are going to have any attempt to salvage this man's foot we are going to need blood flow and I would go ahead with this. IV antibiotics in the meantime and consideration of hyperbaric oxygen all of these things I discussed. The option would be a below-knee amputation which the patient does not seem ready for his daughter is certainly not ready for. But we also talked about this 6/30; bone culture I did last week showed the same Enterobacter cloacae I as previous. Also rare Staph epidermidis. I am not really sure of the significance of this which may be a contamination. He has a anterior grade access attempt by [name] of vein and vascular on 7/7. After we see the result of this I will go over his options which are continued antibiotics and hyperbarics versus an attempt at a ray amputation if [name] is successful in establishing more blood flow here. 7/12 the patient saw [name] yesterday who extended his IV antibiotics. He also had an attempt at left  anterior tibial artery retrograde revascularization by [name] however this was not successful. We are therefore looking at the same blood flow that we had previously. I had an extensive discussion with him today. I think he has a good chance that healing ray amputation. The other option is to continue his IV antibiotics and initiate hyperbaric oxygen therapy. The patient seems to be opting for the latter 7/19; continues on IV antibiotics per the patient until August 3. He is  going to wait to [facility] sometime after that. He is approved for HBO but I do not think we will be able to get that going until sometime in the August 20 21st range per he has been using silver alginate Objective Constitutional Sitting or standing Blood Pressure is within target range for patient.. Pulse regular and within target range for patient.Marland Kitchen Respirations regular, non-labored and within target range.. Temperature is normal and within the target range for the patient.Marland Kitchen Appears in no distress. Vitals Time Taken: 9:36 AM, Height: 74 in, Weight: 238 lbs, BMI: 30.6, Temperature: 98.2 F, Pulse: 59 bpm, Respiratory Rate: 18 breaths/min, Blood Pressure: 106/66 mmHg, Capillary Blood Glucose: 111 mg/dl. General Notes: Wound exam; in general the wound surface appears better. This is on the left lateral fifth metatarsal head. The real problem is in the most posterior one third of the wound surface area. Here the tissue is not as vibrant and there is depth but again this does not easily probe to bone this week. That is a nice improvement no purulence no surrounding Integumentary (Hair, Skin) Wound #1 status is Open. Original cause of wound was Gradually Appeared. The date acquired was: 06/29/2020. The wound has been in treatment 15 weeks. The wound is located on the Left,Plantar Metatarsal head fifth. The wound measures 1cm length x 2cm width x 0.4cm depth; 1.571cm^2 area and 0.628cm^3 volume. There is bone, tendon, and  Fat Layer (Subcutaneous Tissue) exposed. There is no undermining noted, however, there is tunneling at 9:00 with a maximum distance of 0.6cm. There is a medium amount of serosanguineous drainage noted. The wound margin is well defined and not attached to the wound base. There is large (67-100%) red granulation within the wound bed. There is a small (1-33%) amount of necrotic tissue within the wound bed including Adherent Slough. General Notes: slight maceration Assessment Active Problems ICD-10 Type 2 diabetes mellitus with foot ulcer Type 2 diabetes mellitus with diabetic peripheral angiopathy without gangrene Non-pressure chronic ulcer of other part of left foot with other specified severity Subacute osteomyelitis, left ankle and foot Type 2 diabetes mellitus with diabetic polyneuropathy Plan Follow-up Appointments: Return Appointment in 2 weeks. - Tuesday Dr. Dellia Nims Other: - Dr. Baxter Flattery extending IV antibiotic for 2 more weeks Follow up with [facility] on July 26 Bathing/ Shower/ Hygiene: May shower and wash wound with soap and water. - on days changing dressing. Do not soak foot. Edema Control - Lymphedema / SCD / Other: Elevate legs to the level of the heart or above for 30 minutes daily and/or when sitting, a frequency of: - throughout the day Avoid standing for long periods of time. Exercise regularly Off-Loading: Open toe surgical shoe to: - left foot Additional Orders / Instructions: Follow Nutritious Diet Hyperbaric Oxygen Therapy: Evaluate for HBO Therapy Indication: - Osteomyelitis of Right lateral foot 2.0 ATA for 90 Minutes with 2 Five (5) Minute Air Breaks T Number of Treatments: - 40 otal One treatments per day (delivered Monday through Friday unless otherwise specified in Special Instructions below): Finger stick Blood Glucose Pre- and Post- HBOT Treatment. Follow Hyperbaric Oxygen Glycemia Protocol Afrin (Oxymetazoline HCL) 0.05% nasal spray - 1 spray in both nostrils  daily as needed prior to HBO treatment for difficulty clearing ears WOUND #1: - Metatarsal head fifth Wound Laterality: Plantar, Left Cleanser: Soap and Water 1 x Per Day/30 Days Discharge Instructions: May shower and wash wound with dial antibacterial soap and water prior to dressing change. Cleanser: Wound Cleanser (Generic) 1  x Per Day/30 Days Discharge Instructions: Cleanse the wound with wound cleanser prior to applying a clean dressing using gauze sponges, not tissue or cotton balls. Prim Dressing: KerraCel Ag Gelling Fiber Dressing, 4x5 in (silver alginate) (Generic) 1 x Per Day/30 Days ary Discharge Instructions: Apply silver alginate to wound bed as instructed Secondary Dressing: Woven Gauze Sponges 2x2 in (Generic) 1 x Per Day/30 Days Discharge Instructions: Apply over primary dressing as directed. Secondary Dressing: Zetuvit Plus Silicone Border Dressing 4x4 (in/in) (Generic) 1 x Per Day/30 Days Discharge Instructions: Apply silicone border over primary dressing as directed. Secured With: [employer], 4.5x3.1 (in/yd) (Generic) 1 x Per Day/30 Days Discharge Instructions: Secure with Kerlix as directed. Secured With: 62M Medipore H Soft Cloth Surgical T 4 x 2 (in/yd) (Generic) 1 x Per Day/30 Days ape Discharge Instructions: Secure dressing with tape as directed. #1 I am continuing with silver alginate 2. The patient continues on his IV antibiotics as directed by Dr. Baxter Flattery 3. Unfortunately the patient has been approved for HBO however I wonder if the timeframe here is not right. I think this only lasts for 30 days. We will need to look at the timeframe here. Electronic Signature(s) Signed: 01/14/2021 5:24:30 PM By: Linton Ham MD Entered By: Linton Ham on 01/14/2021 10:14:32 -------------------------------------------------------------------------------- SuperBill Details Patient Name: Date of Service: [name] [name] 01/14/2021 Medical Record Number:  [id number] Patient Account Number: [id number] Date of Birth/Sex: Treating RN: 01/04/1946 (74 y.o. [name], [name] Primary Care Provider: Consuello Masse Other Clinician: Referring Provider: Treating Provider/Extender: Zadie Cleverly in Treatment: 15 Diagnosis Coding ICD-10 Codes Code Description [phone] Type 2 diabetes mellitus with foot ulcer E11.51 Type 2 diabetes mellitus with diabetic peripheral angiopathy without gangrene L97.528 Non-pressure chronic ulcer of other part of left foot with other specified severity M86.272 Subacute osteomyelitis, left ankle and foot E11.42 Type 2 diabetes mellitus with diabetic polyneuropathy Facility Procedures CPT4 Code: [id number] Description: 40347 - WOUND CARE VISIT-LEV 3 EST PT Modifier: Quantity: 1 Physician Procedures Electronic Signature(s) Signed: 01/14/2021 5:24:30 PM By: Linton Ham MD Signed: 01/14/2021 5:32:12 PM By: Rhae Hammock RN Entered By: Rhae Hammock on 01/14/2021 11:22:40

## 2021-01-17 DIAGNOSIS — Z7901 Long term (current) use of anticoagulants: Secondary | ICD-10-CM | POA: Diagnosis not present

## 2021-01-17 DIAGNOSIS — Z452 Encounter for adjustment and management of vascular access device: Secondary | ICD-10-CM | POA: Diagnosis not present

## 2021-01-17 DIAGNOSIS — I2510 Atherosclerotic heart disease of native coronary artery without angina pectoris: Secondary | ICD-10-CM | POA: Diagnosis not present

## 2021-01-17 DIAGNOSIS — N400 Enlarged prostate without lower urinary tract symptoms: Secondary | ICD-10-CM | POA: Diagnosis not present

## 2021-01-17 DIAGNOSIS — E1169 Type 2 diabetes mellitus with other specified complication: Secondary | ICD-10-CM | POA: Diagnosis not present

## 2021-01-17 DIAGNOSIS — I5022 Chronic systolic (congestive) heart failure: Secondary | ICD-10-CM | POA: Diagnosis not present

## 2021-01-17 DIAGNOSIS — E11621 Type 2 diabetes mellitus with foot ulcer: Secondary | ICD-10-CM | POA: Diagnosis not present

## 2021-01-17 DIAGNOSIS — Z792 Long term (current) use of antibiotics: Secondary | ICD-10-CM | POA: Diagnosis not present

## 2021-01-17 DIAGNOSIS — I951 Orthostatic hypotension: Secondary | ICD-10-CM | POA: Diagnosis not present

## 2021-01-17 DIAGNOSIS — E1142 Type 2 diabetes mellitus with diabetic polyneuropathy: Secondary | ICD-10-CM | POA: Diagnosis not present

## 2021-01-17 DIAGNOSIS — E1151 Type 2 diabetes mellitus with diabetic peripheral angiopathy without gangrene: Secondary | ICD-10-CM | POA: Diagnosis not present

## 2021-01-17 DIAGNOSIS — Z6830 Body mass index (BMI) 30.0-30.9, adult: Secondary | ICD-10-CM | POA: Diagnosis not present

## 2021-01-17 DIAGNOSIS — M48062 Spinal stenosis, lumbar region with neurogenic claudication: Secondary | ICD-10-CM | POA: Diagnosis not present

## 2021-01-17 DIAGNOSIS — M869 Osteomyelitis, unspecified: Secondary | ICD-10-CM | POA: Diagnosis not present

## 2021-01-17 DIAGNOSIS — I110 Hypertensive heart disease with heart failure: Secondary | ICD-10-CM | POA: Diagnosis not present

## 2021-01-17 DIAGNOSIS — Z5181 Encounter for therapeutic drug level monitoring: Secondary | ICD-10-CM | POA: Diagnosis not present

## 2021-01-17 DIAGNOSIS — E785 Hyperlipidemia, unspecified: Secondary | ICD-10-CM | POA: Diagnosis not present

## 2021-01-17 DIAGNOSIS — Z7984 Long term (current) use of oral hypoglycemic drugs: Secondary | ICD-10-CM | POA: Diagnosis not present

## 2021-01-17 DIAGNOSIS — Z7982 Long term (current) use of aspirin: Secondary | ICD-10-CM | POA: Diagnosis not present

## 2021-01-17 DIAGNOSIS — L97528 Non-pressure chronic ulcer of other part of left foot with other specified severity: Secondary | ICD-10-CM | POA: Diagnosis not present

## 2021-01-17 DIAGNOSIS — L03116 Cellulitis of left lower limb: Secondary | ICD-10-CM | POA: Diagnosis not present

## 2021-01-17 DIAGNOSIS — I255 Ischemic cardiomyopathy: Secondary | ICD-10-CM | POA: Diagnosis not present

## 2021-01-17 DIAGNOSIS — I4891 Unspecified atrial fibrillation: Secondary | ICD-10-CM | POA: Diagnosis not present

## 2021-01-17 DIAGNOSIS — E669 Obesity, unspecified: Secondary | ICD-10-CM | POA: Diagnosis not present

## 2021-01-21 ENCOUNTER — Ambulatory Visit: Payer: Medicare Other | Admitting: Vascular Surgery

## 2021-01-21 ENCOUNTER — Encounter (HOSPITAL_BASED_OUTPATIENT_CLINIC_OR_DEPARTMENT_OTHER): Payer: Medicare Other | Admitting: Internal Medicine

## 2021-01-21 ENCOUNTER — Other Ambulatory Visit: Payer: Self-pay

## 2021-01-21 DIAGNOSIS — E11621 Type 2 diabetes mellitus with foot ulcer: Secondary | ICD-10-CM | POA: Diagnosis not present

## 2021-01-21 DIAGNOSIS — L97522 Non-pressure chronic ulcer of other part of left foot with fat layer exposed: Secondary | ICD-10-CM | POA: Diagnosis not present

## 2021-01-21 NOTE — Progress Notes (Addendum)
[name] [name] ([id number]) Visit Report for 01/21/2021 HPI Details Patient Name: Date of Service: [name] [name] 01/21/2021 8:45 A M Medical Record Number: [id number] Patient Account Number: [id number] Date of Birth/Sex: Treating RN: [birth year] (75 y.o. [name] Primary Care Provider: Consuello Masse Other Clinician: Referring Provider: Treating Provider/Extender: Zadie Cleverly in Treatment: 16 History of Present Illness HPI Description: ADMISSION 09/27/2020 This is a 75 year old man who lives in [name]. He apparently has had callus over the plantar fifth metatarsal head in the past for which she is followed by podiatry in [place]. They shaved down the callus and this was done in January. He states that he went to [place] in January and became aware of when he was there of an open wound in this area. He followed up with podiatry and has been soaking this twice a day with Epson salts and using Silvadene. Apparently one of the podiatrist told him he may need surgery because of bone protrusion. Past medical history includes an ischemic cardiomyopathy, type 2 diabetes with peripheral neuropathy, BPH, PVD, orthostatic hypotension, atrial fibrillation, hyperlipidemia and hypertension Arterial studies in 2018 showed a noncompressible ABI on the left. It was again noncompressible today at 1.7 although his pulses are easily palpable 4/8; patient I admitted to the clinic last week. Wound on the plantar left fifth metatarsal head which has been refractory. He also has a bit of subluxation of the bone in the head. He is a type II diabetic with peripheral neuropathy. We used silver collagen after debridement He arrives back in clinic today. He has erythema spreading into the lateral part of the fifth metatarsal head. I removed some undermining tissue from around the wound and clearly there is a connection between the wound and the lateral part of the fifth met  head. A culture was done. Area of erythema on the lateral part of the foot was marked. His wife says that has been there since Wednesday 4/15; the patient arrived last week with erythema spreading in the lateral part of the fifth metatarsal head. There was a wound connected with the plantar fifth met head original wound. I gave him empiric Augmentin. Surprisingly the culture I did was negative. We have been using silver alginate. The wound has been open since January. He arrives in clinic today with the erythema much better 4/22; patient presents today for 1 week follow-up of his fifth metatarsal head wound. Daughter is present with the patient. She states that the wound has improved significantly since 2 weeks ago. There is no longer redness to the foot and the actual wound bed is smaller. Patient overall feels well and has no complaints today. 4/29; patient presents for 1 week follow-up of his fifth metatarsal head wound. Daughter is present. Patient has been using silver alginate every other day to the wound site. He has tried to relieve pressure with a surgical shoe. He denies signs of infections. 5/6; fifth metatarsal head wound. He has been using silver alginate changed to Santyl last week which he managed to obtain for $21 with a coupon we gave him. He also has an appointment with vein and vascular. I think Dr. Heber [place] referred him. He has noncompressible ABIs. The patient also had an x-ray and that was negative 5/13; the patient went to see [name]. Areas on the left fifth metatarsal head plantar and lateral. [name] noted that his ABI in the right was 0.99 but monophasic waveforms with a TBI at 0.32.  He wanted to go ahead with an angiogram which is booked for Thursday. I talked to the patient about this and advised him to go forward with this. 5/23; patient presents for 1 week follow-up. He has been using silver alginate to the wound bed. He has no complaints today. Denies signs of  infection. He had an arteriogram done on 5/19. 5/31; angiogram showed patent common femoral, SFA, profunda, above-knee popliteal artery. 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 He does have inline flow down the left lower extremity through the peroneal that is widely patent. The peroneal gave off collaterals distally to the anterior tibial that feels retrograde at the ankle. An attempted recanalization of the left anterior tibial artery was made but was not successful. The plan as outlined by [name] is that he will continue getting aggressive wound care and he would consider a retrograde left anterior tibial intervention if there is no improvement. Wound itself does not look as though it is making any improvement. There is still tunneling superiorly precariously close to bone 6/3; back for the obligatory first total contact cast change. The wound clearly has exposed bone. He is a type II diabetic. He has been revascularized. Plain x- ray did not show osteomyelitis he may require an MRI. After his revascularization I elected to put him into a cast to make sure that all the pressure was coming off this area 6/7; patient back for his routine visit. The wound is not doing well. The patient had a lot of discomfort since he was last here 4 days ago. No systemic illness. He will not go back in the total contact cast today. 6/14; culture I did last time showed Enterobacter. At the time there was erythema and swelling I put him on Augmentin and Bactrim empirically. The Enterobacter was sensitive to the Bactrim DS. He sees [name] tomorrow. I am looking for her recommendation about antibiotic choices and route of administration. He sees [name] of vascular surgery on 6/21. I have communicated with [name] I would like to see whether or not further revascularization will be entertained and whether [name] feels  that he could survive a foot conserving surgery if that is the route the patient wishes to go. After these next 2 consultations we may be able to discuss the route the patient wants to take either a surgical 1 or perhaps 1 that involves a prolonged course of antibiotics and hyperbaric oxygen. I touch bases with the patient and his daughter on all of this. MRI suggested osteomyelitis in the distal 2 cm of the fifth metatarsal 6/24; the patient was kindly seen by [name] of infectious disease by telehealth visit. He has established a PICC line and is on a Carbapenem for 4 weeks although I am not really sure which 1. Initial culture was Enterobacter I believe this was a swab. He is also followed up with [name]. He again is considering an anterograde anterior tibial attempted revascularization. The patient is to call if they agree to move forward with the anterograde attempt. He started an IV antibiotics already. He arrives in clinic today with again the wound deteriorating. There is exposed bone widely which is quite a bit worse than last week. I did obtain a piece of this for culture but more bone debridement is likely going to need to be done. I had an extensive discussion with the patient and his daughter.  The problem is the ray amputation success depends largely on blood flow. [name] did not sound optimistic about this. However I would definitely go ahead with the retrograde attempt by [name] even though there are potential risks. I explained this to the patient. If we are going to have any attempt to salvage this man's foot we are going to need blood flow and I would go ahead with this. IV antibiotics in the meantime and consideration of hyperbaric oxygen all of these things I discussed. The option would be a below-knee amputation which the patient does not seem ready for his daughter is certainly not ready for. But we also talked about this 6/30; bone culture I did last week showed the  same Enterobacter cloacae I as previous. Also rare Staph epidermidis. I am not really sure of the significance of this which may be a contamination. He has a anterior grade access attempt by [name] of vein and vascular on 7/7. After we see the result of this I will go over his options which are continued antibiotics and hyperbarics versus an attempt at a ray amputation if [name] is successful in establishing more blood flow here. 7/12 the patient saw [name] yesterday who extended his IV antibiotics. He also had an attempt at left anterior tibial artery retrograde revascularization by [name] however this was not successful. We are therefore looking at the same blood flow that we had previously. I had an extensive discussion with him today. I think he has a good chance that healing ray amputation. The other option is to continue his IV antibiotics and initiate hyperbaric oxygen therapy. The patient seems to be opting for the latter 7/19; continues on IV antibiotics per the patient until August 3. He is going to wait to [facility] sometime after that. He is approved for HBO but I do not think we will be able to get that going until sometime in the August 20 21st range per he has been using silver alginate 7/26; continues on IV antibiotics as directed by [name]. This will apparently end on August 3 and then the patient thinks he will go on oral antibiotics after that. The patient is leaving on vacation from 8/10 through 8/17. There is no point in putting him through hyperbarics at this point. His wound has improved he has been using silver alginate at 1 point about 33% of this was exposed bone all of this is covered by granulation now. Electronic Signature(s) Signed: 01/21/2021 5:43:33 PM By: Linton Ham MD Entered By: Linton Ham on 01/21/2021 10:18:28 -------------------------------------------------------------------------------- Physical Exam Details Patient Name:  Date of Service: [name] [name] 01/21/2021 8:45 A M Medical Record Number: [id number] Patient Account Number: [id number] Date of Birth/Sex: Treating RN: [birth year] (75 y.o. [name] Primary Care Provider: Consuello Masse Other Clinician: Referring Provider: Treating Provider/Extender: Zadie Cleverly in Treatment: 16 Constitutional Sitting or standing Blood Pressure is within target range for patient.. Pulse regular and within target range for patient.Marland Kitchen Respirations regular, non-labored and within target range.. Temperature is normal and within the target range for the patient.Marland Kitchen Appears in no distress. Notes Wound exam; wound surface appears better. Wound is on the lateral left fifth metatarsal head extends posteriorly towards the plantar aspect. The posterior one third of the wound is not quite as healthy granulation as the anterior part nevertheless there is now no exposed bone. Some undermining on some aspects of the circumference. There is no evidence  of surrounding infection. Electronic Signature(s) Signed: 01/21/2021 5:43:33 PM By: Linton Ham MD Entered By: Linton Ham on 01/21/2021 10:20:06 -------------------------------------------------------------------------------- Physician Orders Details Patient Name: Date of Service: [name] [name] 01/21/2021 8:45 A M Medical Record Number: [id number] Patient Account Number: [id number] Date of Birth/Sex: Treating RN: 02/15/1946 (75 y.o. [name] Primary Care Provider: Consuello Masse Other Clinician: Referring Provider: Treating Provider/Extender: Zadie Cleverly in Treatment: 16 Verbal / Phone Orders: No Diagnosis Coding Follow-up Appointments ppointment in: - on Thursday August 18th Return A Bathing/ Shower/ Hygiene May shower and wash wound with soap and water. - on days changing dressing. Do not soak foot. Edema Control - Lymphedema / SCD / Other Elevate legs  to the level of the heart or above for 30 minutes daily and/or when sitting, a frequency of: - throughout the day Avoid standing for long periods of time. Exercise regularly Off-Loading Open toe surgical shoe to: - left foot Additional Orders / Instructions Follow Nutritious Diet Hyperbaric Oxygen Therapy Evaluate for HBO Therapy - HBO on HOLD until pt. evaluated after he comes back from vacation Indication: - Osteomyelitis of Right lateral foot 2.0 ATA for 90 Minutes with 2 Five (5) Minute A Breaks ir Total Number of Treatments: - 40 One treatments per day (delivered Monday through Friday unless otherwise specified in Special Instructions below): Finger stick Blood Glucose Pre- and Post- HBOT Treatment. Follow Hyperbaric Oxygen Glycemia Protocol Afrin (Oxymetazoline HCL) 0.05% nasal spray - 1 spray in both nostrils daily as needed prior to HBO treatment for difficulty clearing ears Wound Treatment Wound #1 - Metatarsal head fifth Wound Laterality: Plantar, Left Cleanser: Soap and Water 1 x Per Day/30 Days Discharge Instructions: May shower and wash wound with dial antibacterial soap and water prior to dressing change. Cleanser: Wound Cleanser (DME) (Generic) 1 x Per Day/30 Days Discharge Instructions: Cleanse the wound with wound cleanser prior to applying a clean dressing using gauze sponges, not tissue or cotton balls. Prim Dressing: KerraCel Ag Gelling Fiber Dressing, 4x5 in (silver alginate) (DME) (Generic) 1 x Per Day/30 Days ary Discharge Instructions: Apply silver alginate to wound bed as instructed Secondary Dressing: Woven Gauze Sponges 2x2 in (DME) (Generic) 1 x Per Day/30 Days Discharge Instructions: Apply over primary dressing as directed. Secondary Dressing: Bordered Gauze, 2x3.75 in (DME) (Generic) 1 x Per Day/30 Days Discharge Instructions: Apply over primary dressing as directed. GLYCEMIA INTERVENTIONS PROTOCOL PRE-HBO GLYCEMIA INTERVENTIONS ACTION  INTERVENTION Obtain pre-HBO capillary blood glucose (ensure 1 physician order is in chart). A. Notify HBO physician and await physician orders. 2 If result is 70 mg/dl or below: B. If the result meets the hospital definition of a critical result, follow hospital policy. A. Give patient an 8 ounce Glucerna Shake, an 8 ounce Ensure, or 8 ounces of a Glucerna/Ensure equivalent dietary supplement*. B. Wait 30 minutes. If result is 71 mg/dl to 130 mg/dl: C. Retest patients capillary blood glucose (CBG). D. If result greater than or equal to 110 mg/dl, proceed with HBO. If result less than 110 mg/dl, notify HBO physician and consider holding HBO. If result is 131 mg/dl to 249 mg/dl: A. Proceed with HBO. A. Notify HBO physician and await physician orders. B. It is recommended to hold HBO and do If result is 250 mg/dl or greater: blood/urine ketone testing. C. If the result meets the hospital definition of a critical result, follow hospital policy. POST-HBO GLYCEMIA INTERVENTIONS ACTION INTERVENTION Obtain post HBO capillary blood glucose (ensure 1 physician order is  in chart). A. Notify HBO physician and await physician orders. 2 If result is 70 mg/dl or below: B. If the result meets the hospital definition of a critical result, follow hospital policy. A. Give patient an 8 ounce Glucerna Shake, an 8 ounce Ensure, or 8 ounces of a Glucerna/Ensure equivalent dietary supplement*. B. Wait 15 minutes for symptoms of If result is 71 mg/dl to 100 mg/dl: hypoglycemia (i.e. nervousness, anxiety, sweating, chills, clamminess, irritability, confusion, tachycardia or dizziness). C. If patient asymptomatic, discharge patient. If patient symptomatic, repeat capillary blood glucose (CBG) and notify HBO physician. If result is 101 mg/dl to 249 mg/dl: A. Discharge patient. A. Notify HBO physician and await physician orders. B. It is recommended to do blood/urine ketone If result  is 250 mg/dl or greater: testing. C. If the result meets the hospital definition of a critical result, follow hospital policy. *Juice or candies are NOT equivalent products. If patient refuses the Glucerna or Ensure, please consult the hospital dietitian for an appropriate substitute. Electronic Signature(s) Signed: 02/11/2021 1:31:59 PM By: Linton Ham MD Signed: 07/17/2021 12:40:52 PM By: Rhae Hammock RN Previous Signature: 01/21/2021 5:43:33 PM Version By: Linton Ham MD Previous Signature: 01/21/2021 5:55:16 PM Version By: Rhae Hammock RN Entered By: Rhae Hammock on 01/30/2021 16:53:17 -------------------------------------------------------------------------------- Problem List Details Patient Name: Date of Service: [name] [name] 01/21/2021 8:45 A M Medical Record Number: [id number] Patient Account Number: [id number] Date of Birth/Sex: Treating RN: Nov 27, 1945 (74 y.o. [name], [name] Primary Care Provider: Consuello Masse Other Clinician: Referring Provider: Treating Provider/Extender: Zadie Cleverly in Treatment: 16 Active Problems ICD-10 Encounter Code Description Active Date MDM Diagnosis E11.621 Type 2 diabetes mellitus with foot ulcer 09/27/2020 No Yes E11.51 Type 2 diabetes mellitus with diabetic peripheral angiopathy without gangrene 11/11/2020 No Yes L97.528 Non-pressure chronic ulcer of other part of left foot with other specified 09/27/2020 No Yes severity M86.272 Subacute osteomyelitis, left ankle and foot 12/10/2020 No Yes E11.42 Type 2 diabetes mellitus with diabetic polyneuropathy 09/27/2020 No Yes Inactive Problems ICD-10 Code Description Active Date Inactive Date L03.116 Cellulitis of left lower limb 10/04/2020 10/04/2020 Resolved Problems Electronic Signature(s) Signed: 01/21/2021 5:43:33 PM By: Linton Ham MD Entered By: Linton Ham on 01/21/2021  10:17:06 -------------------------------------------------------------------------------- Progress Note Details Patient Name: Date of Service: [name] [name] 01/21/2021 8:45 A M Medical Record Number: [id number] Patient Account Number: [id number] Date of Birth/Sex: Treating RN: [birth year] (75 y.o. [name] Primary Care Provider: Consuello Masse Other Clinician: Referring Provider: Treating Provider/Extender: Zadie Cleverly in Treatment: 16 Subjective History of Present Illness (HPI) ADMISSION 09/27/2020 This is a 75 year old man who lives in [place]. He apparently has had callus over the plantar fifth metatarsal head in the past for which she is followed by podiatry in [place]. They shaved down the callus and this was done in January. He states that he went to [place] in January and became aware of when he was there of an open wound in this area. He followed up with podiatry and has been soaking this twice a day with Epson salts and using Silvadene. Apparently one of the podiatrist told him he may need surgery because of bone protrusion. Past medical history includes an ischemic cardiomyopathy, type 2 diabetes with peripheral neuropathy, BPH, PVD, orthostatic hypotension, atrial fibrillation, hyperlipidemia and hypertension Arterial studies in 2018 showed a noncompressible ABI on the left. It was again noncompressible today at 1.7 although his pulses are easily palpable 4/8; patient I admitted to  the clinic last week. Wound on the plantar left fifth metatarsal head which has been refractory. He also has a bit of subluxation of the bone in the head. He is a type II diabetic with peripheral neuropathy. We used silver collagen after debridement He arrives back in clinic today. He has erythema spreading into the lateral part of the fifth metatarsal head. I removed some undermining tissue from around the wound and clearly there is a connection between the  wound and the lateral part of the fifth met head. A culture was done. Area of erythema on the lateral part of the foot was marked. His wife says that has been there since Wednesday 4/15; the patient arrived last week with erythema spreading in the lateral part of the fifth metatarsal head. There was a wound connected with the plantar fifth met head original wound. I gave him empiric Augmentin. Surprisingly the culture I did was negative. We have been using silver alginate. The wound has been open since January. He arrives in clinic today with the erythema much better 4/22; patient presents today for 1 week follow-up of his fifth metatarsal head wound. Daughter is present with the patient. She states that the wound has improved significantly since 2 weeks ago. There is no longer redness to the foot and the actual wound bed is smaller. Patient overall feels well and has no complaints today. 4/29; patient presents for 1 week follow-up of his fifth metatarsal head wound. Daughter is present. Patient has been using silver alginate every other day to the wound site. He has tried to relieve pressure with a surgical shoe. He denies signs of infections. 5/6; fifth metatarsal head wound. He has been using silver alginate changed to Santyl last week which he managed to obtain for $21 with a coupon we gave him. He also has an appointment with vein and vascular. I think Dr. Heber [place] referred him. He has noncompressible ABIs. The patient also had an x-ray and that was negative 5/13; the patient went to see [name]. Areas on the left fifth metatarsal head plantar and lateral. [name] noted that his ABI in the right was 0.99 but monophasic waveforms with a TBI at 0.32. He wanted to go ahead with an angiogram which is booked for Thursday. I talked to the patient about this and advised him to go forward with this. 5/23; patient presents for 1 week follow-up. He has been using silver alginate to the wound bed. He  has no complaints today. Denies signs of infection. He had an arteriogram done on 5/19. 5/31; angiogram showed patent common femoral, SFA, profunda, above-knee popliteal artery. 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 He does have inline flow down the left lower extremity through the peroneal that is widely patent. The peroneal gave off collaterals distally to the anterior tibial that feels retrograde at the ankle. An attempted recanalization of the left anterior tibial artery was made but was not successful. The plan as outlined by [name] is that he will continue getting aggressive wound care and he would consider a retrograde left anterior tibial intervention if there is no improvement. Wound itself does not look as though it is making any improvement. There is still tunneling superiorly precariously close to bone 6/3; back for the obligatory first total contact cast change. The wound clearly has exposed bone. He is a type II diabetic. He has been revascularized. Plain x- ray did  not show osteomyelitis he may require an MRI. After his revascularization I elected to put him into a cast to make sure that all the pressure was coming off this area 6/7; patient back for his routine visit. The wound is not doing well. The patient had a lot of discomfort since he was last here 4 days ago. No systemic illness. He will not go back in the total contact cast today. 6/14; culture I did last time showed Enterobacter. At the time there was erythema and swelling I put him on Augmentin and Bactrim empirically. The Enterobacter was sensitive to the Bactrim DS. He sees [name] tomorrow. I am looking for her recommendation about antibiotic choices and route of administration. He sees [name] of vascular surgery on 6/21. I have communicated with [name] I would like to see whether or not further revascularization will be  entertained and whether [name] feels that he could survive a foot conserving surgery if that is the route the patient wishes to go. After these next 2 consultations we may be able to discuss the route the patient wants to take either a surgical 1 or perhaps 1 that involves a prolonged course of antibiotics and hyperbaric oxygen. I touch bases with the patient and his daughter on all of this. MRI suggested osteomyelitis in the distal 2 cm of the fifth metatarsal 6/24; the patient was kindly seen by [name] of infectious disease by telehealth visit. He has established a PICC line and is on a Carbapenem for 4 weeks although I am not really sure which 1. Initial culture was Enterobacter I believe this was a swab. He is also followed up with [name]. He again is considering an anterograde anterior tibial attempted revascularization. The patient is to call if they agree to move forward with the anterograde attempt. He started an IV antibiotics already. He arrives in clinic today with again the wound deteriorating. There is exposed bone widely which is quite a bit worse than last week. I did obtain a piece of this for culture but more bone debridement is likely going to need to be done. I had an extensive discussion with the patient and his daughter. The problem is the ray amputation success depends largely on blood flow. [name] did not sound optimistic about this. However I would definitely go ahead with the retrograde attempt by [name] even though there are potential risks. I explained this to the patient. If we are going to have any attempt to salvage this man's foot we are going to need blood flow and I would go ahead with this. IV antibiotics in the meantime and consideration of hyperbaric oxygen all of these things I discussed. The option would be a below-knee amputation which the patient does not seem ready for his daughter is certainly not ready for. But we also talked about this 6/30;  bone culture I did last week showed the same Enterobacter cloacae I as previous. Also rare Staph epidermidis. I am not really sure of the significance of this which may be a contamination. He has a anterior grade access attempt by [name] of vein and vascular on 7/7. After we see the result of this I will go over his options which are continued antibiotics and hyperbarics versus an attempt at a ray amputation if [name] is successful in establishing more blood flow here. 7/12 the patient saw [name] yesterday who extended his IV antibiotics. He also had an attempt at left  anterior tibial artery retrograde revascularization by [name] however this was not successful. We are therefore looking at the same blood flow that we had previously. I had an extensive discussion with him today. I think he has a good chance that healing ray amputation. The other option is to continue his IV antibiotics and initiate hyperbaric oxygen therapy. The patient seems to be opting for the latter 7/19; continues on IV antibiotics per the patient until August 3. He is going to wait to [facility] sometime after that. He is approved for HBO but I do not think we will be able to get that going until sometime in the August 20 21st range per he has been using silver alginate 7/26; continues on IV antibiotics as directed by [name]. This will apparently end on August 3 and then the patient thinks he will go on oral antibiotics after that. The patient is leaving on vacation from 8/10 through 8/17. There is no point in putting him through hyperbarics at this point. His wound has improved he has been using silver alginate at 1 point about 33% of this was exposed bone all of this is covered by granulation now. Objective Constitutional Sitting or standing Blood Pressure is within target range for patient.. Pulse regular and within target range for patient.Marland Kitchen Respirations regular, non-labored and within target  range.. Temperature is normal and within the target range for the patient.Marland Kitchen Appears in no distress. Vitals Time Taken: 9:02 AM, Height: 74 in, Weight: 238 lbs, BMI: 30.6, Temperature: 98.3 F, Pulse: 58 bpm, Respiratory Rate: 18 breaths/min, Blood Pressure: 113/68 mmHg, Capillary Blood Glucose: 122 mg/dl. General Notes: Wound exam; wound surface appears better. Wound is on the lateral left fifth metatarsal head extends posteriorly towards the plantar aspect. The posterior one third of the wound is not quite as healthy granulation as the anterior part nevertheless there is now no exposed bone. Some undermining on some aspects of the circumference. There is no evidence of surrounding infection. Integumentary (Hair, Skin) Wound #1 status is Open. Original cause of wound was Gradually Appeared. The date acquired was: 06/29/2020. The wound has been in treatment 16 weeks. The wound is located on the Left,Plantar Metatarsal head fifth. The wound measures 0.9cm length x 1.8cm width x 0.5cm depth; 1.272cm^2 area and 0.636cm^3 volume. There is bone, tendon, and Fat Layer (Subcutaneous Tissue) exposed. There is undermining starting at 5:00 and ending at 6:00 with a maximum distance of 0.6cm. There is a medium amount of serosanguineous drainage noted. The wound margin is well defined and not attached to the wound base. There is large (67-100%) red granulation within the wound bed. There is a small (1-33%) amount of necrotic tissue within the wound bed including Adherent Slough. General Notes: macerated periwound. 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 - on days changing dressing. Do not soak foot. Edema Control - Lymphedema / SCD / Other: Elevate legs to the level of the heart or above for 30 minutes daily and/or when sitting, a frequency of: - throughout the day Avoid standing for long periods of time. Exercise regularly Off-Loading: Open toe surgical shoe  to: - left foot Additional Orders / Instructions: Follow Nutritious Diet Hyperbaric Oxygen Therapy: Evaluate for HBO Therapy - HBO on HOLD until pt. evaluated after he comes back from vacation Indication: - Osteomyelitis of Right lateral foot 2.0 ATA for 90 Minutes with 2 Five (5) Minute Air Breaks T Number of Treatments: - 40 otal One treatments per day (delivered Monday through Friday unless otherwise specified in Special Instructions below): Finger stick Blood Glucose Pre- and Post- HBOT Treatment. Follow Hyperbaric Oxygen Glycemia Protocol Afrin (Oxymetazoline HCL) 0.05% nasal spray - 1 spray in both nostrils daily as needed prior to HBO treatment for difficulty clearing ears WOUND #1: - Metatarsal head fifth Wound Laterality: Plantar, Left Cleanser: Soap and Water Every Other Day/30 Days Discharge Instructions: May shower and wash wound with dial antibacterial soap and water prior to dressing change. Cleanser: Wound Cleanser (Generic) Every Other Day/30 Days Discharge Instructions: Cleanse the wound with wound cleanser prior to applying a clean dressing using gauze sponges, not tissue or cotton balls. Prim Dressing: KerraCel Ag Gelling Fiber Dressing, 4x5 in (silver alginate) (Generic) Every Other Day/30 Days ary Discharge Instructions: Apply silver alginate to wound bed as instructed Secondary Dressing: Woven Gauze Sponges 2x2 in (Generic) Every Other Day/30 Days Discharge Instructions: Apply over primary dressing as directed. Secondary Dressing: Bordered Gauze, 2x3.75 in (Generic) Every  Other Day/30 Days Discharge Instructions: Apply over primary dressing as directed. 1. After some thought I continued with the silver alginate. He is doing well with this in the distal one third of the wound appears to have healthy granulation tissue. There is no evidence of palpable bone and no surrounding infection all of which are improvements 2. May consider changing to silver collagen at some point. 3. He also might benefit from an advanced treatment option. 4. He was approved for HBO but his holiday time would interrupt this and then our approval time from his insurance would expire by the time we get back. I am going to have him come in and see me on August 18. He will call us before then if there is problems Electronic Signature(s) Signed: 01/21/2021 5:43:33 PM By: Linton Ham MD Entered By: Linton Ham on 01/21/2021 10:21:41 -------------------------------------------------------------------------------- SuperBill Details Patient Name: Date of Service: [name] [name] 01/21/2021 Medical Record Number: [id number] Patient Account Number: [id number] Date of Birth/Sex: Treating RN: 22-Jan-1946 (74 y.o. [name], [name] Primary Care Provider: Consuello Masse Other Clinician: Referring Provider: Treating Provider/Extender: Zadie Cleverly in Treatment: 16 Diagnosis Coding ICD-10 Codes Code Description [phone] Type 2 diabetes mellitus with foot ulcer E11.51 Type 2 diabetes mellitus with diabetic peripheral angiopathy without gangrene L97.528 Non-pressure chronic ulcer of other part of left foot with other specified severity M86.272 Subacute osteomyelitis, left ankle and foot E11.42 Type 2 diabetes mellitus with diabetic polyneuropathy Physician Procedures : CPT4 Code Description Modifier [id number] - WC PHYS LEVEL 3 - EST PT ICD-10 Diagnosis Description E11.621 Type 2 diabetes mellitus with foot ulcer M86.272 Subacute osteomyelitis, left ankle and foot  L97.528 Non-pressure chronic ulcer of other part  of left foot with other specified severity Quantity: 1 Electronic Signature(s) Signed: 01/21/2021 5:43:33 PM By: Linton Ham MD Entered By: Linton Ham on 01/21/2021 10:22:05

## 2021-01-21 NOTE — Progress Notes (Signed)
DEMONTA, BUMGARDNER ([id number]) Visit Report for 01/21/2021 Arrival Information Details Patient Name: Date of Service: [name] [name], [name] [name] 01/21/2021 8:45 A M Medical Record Number: [id number] Patient Account Number: [id number] Date of Birth/Sex: Treating RN: [birth year] (75 y.o. [name] Pop, [name] Primary Care Chontel Warning: Consuello Masse Other Clinician: Referring Brance Dartt: Treating Skila Rollins/Extender: Zadie Cleverly in Treatment: 16 Visit Information History Since Last Visit Added or deleted any medications: No Patient Arrived: [name] [name] Any new allergies or adverse reactions: No Arrival Time: 09:00 Had a fall or experienced change in No Accompanied By: self activities of daily living that may affect Transfer Assistance: None risk of falls: Patient Identification Verified: Yes Signs or symptoms of abuse/neglect since last visito No Secondary Verification Process Completed: Yes Hospitalized since last visit: No Patient Requires Transmission-Based Precautions: No Implantable device outside of the clinic excluding No Patient Has Alerts: No cellular tissue based products placed in the center since last visit: Has Dressing in Place as Prescribed: Yes Pain Present Now: No Electronic Signature(s) Signed: 01/21/2021 5:55:16 PM By: Rhae Hammock RN Entered By: Rhae Hammock on 01/21/2021 09:01:35 -------------------------------------------------------------------------------- Lower Extremity Assessment Details Patient Name: Date of Service: [name] [name] 01/21/2021 8:45 A M Medical Record Number: [id number] Patient Account Number: [id number] Date of Birth/Sex: Treating RN: 1946/05/24 (75 y.o. [name] Pop, [name] Primary Care Tabria Steines: Consuello Masse Other Clinician: Referring Foster [name]: Treating [name] [name]/Extender: Zadie Cleverly in Treatment: 16 Edema Assessment Assessed: Shirlyn Goltz: No] Patrice Paradise: No] Edema: [Left: Ye] [Right: s] Calf Left:  Right: Point of Measurement: 40 cm From Medial Instep 36.5 cm Ankle Left: Right: Point of Measurement: 10 cm From Medial Instep 24 cm Vascular Assessment Pulses: Dorsalis Pedis Palpable: [Left:Yes] Electronic Signature(s) Signed: 01/21/2021 5:55:16 PM By: Rhae Hammock RN Entered By: Rhae Hammock on 01/21/2021 09:02:21 -------------------------------------------------------------------------------- Multi Wound Chart Details Patient Name: Date of Service: [name] [name] 01/21/2021 8:45 A M Medical Record Number: [id number] Patient Account Number: [id number] Date of Birth/Sex: Treating RN: 10-09-1945 (75 y.o. [name] Pop, [name] Primary Care [name] [name]: Consuello Masse Other Clinician: Referring [name] [name]: Treating [name] [name]/Extender: Zadie Cleverly in Treatment: 16 Vital Signs Height(in): 74 Capillary Blood Glucose(mg/dl): 122 Weight(lbs): 238 Pulse(bpm): 60 Body Mass Index(BMI): 31 Blood Pressure(mmHg): 113/68 Temperature(F): 98.3 Respiratory Rate(breaths/min): 18 Photos: [N/A:N/A] Left, Plantar Metatarsal head fifth N/A N/A Wound Location: Gradually Appeared N/A N/A Wounding Event: Diabetic Wound/Ulcer of the Lower N/A N/A Primary Etiology: Extremity Sleep Apnea, Arrhythmia, Congestive N/A N/A Comorbid History: Heart Failure, Coronary Artery Disease, Type II Diabetes, Osteoarthritis, Neuropathy 06/29/2020 N/A N/A Date Acquired: 16 N/A N/A Weeks of Treatment: Open N/A N/A Wound Status: 0.9x1.8x0.5 N/A N/A Measurements L x W x D (cm) 1.272 N/A N/A A (cm) : rea 0.636 N/A N/A Volume (cm) : -1691.50% N/A N/A % Reduction in A rea: -2928.60% N/A N/A % Reduction in Volume: 5 Starting Position 1 (o'clock): 6 Ending Position 1 (o'clock): 0.6 Maximum Distance 1 (cm): Yes N/A N/A Undermining: Grade 3 N/A N/A Classification: Medium N/A N/A Exudate A mount: Serosanguineous N/A N/A Exudate Type: red, brown N/A N/A Exudate  Color: Well defined, not attached N/A N/A Wound Margin: Large (67-100%) N/A N/A Granulation A mount: Red N/A N/A Granulation Quality: Small (1-33%) N/A N/A Necrotic A mount: Fat Layer (Subcutaneous Tissue): Yes N/A N/A Exposed Structures: Tendon: Yes Bone: Yes Fascia: No Muscle: No Joint: No Small (1-33%) N/A N/A Epithelialization: macerated periwound. N/A N/A Assessment Notes: Treatment Notes Electronic Signature(s) Signed: 01/21/2021 5:43:33 PM By: Linton Ham MD Signed:  01/21/2021 5:55:16 PM By: Rhae Hammock RN Entered By: Linton Ham on 01/21/2021 10:17:13 -------------------------------------------------------------------------------- Multi-Disciplinary Care Plan Details Patient Name: Date of Service: [name] [name] 01/21/2021 8:45 A M Medical Record Number: [id number] Patient Account Number: [id number] Date of Birth/Sex: Treating RN: 01-16-1946 (75 y.o. [name] Pop, [name] Primary Care [name] [name]: Consuello Masse Other Clinician: Referring Mang Hazelrigg: Treating [name] [name]/Extender: Zadie Cleverly in Treatment: 16 Multidisciplinary Care Plan reviewed with physician Active Inactive Nutrition Nursing Diagnoses: Impaired glucose control: actual or potential Goals: Patient/caregiver verbalizes understanding of need to maintain therapeutic glucose control per primary care physician Date Initiated: 09/27/2020 Target Resolution Date: 03/01/2021 Goal Status: Active Interventions: Assess HgA1c results as ordered upon admission and as needed Assess patient nutrition upon admission and as needed per policy Provide education on elevated blood sugars and impact on wound healing Provide education on nutrition Treatment Activities: Education provided on Nutrition : 01/14/2021 Notes: 10/25/20: Glucose control ongoing. Electronic Signature(s) Signed: 01/21/2021 5:55:16 PM By: Rhae Hammock RN Entered By: Rhae Hammock on 01/21/2021  09:55:19 -------------------------------------------------------------------------------- Pain Assessment Details Patient Name: Date of Service: [name], BA RRY G. 01/21/2021 8:45 A M Medical Record Number: [id number] Patient Account Number: [id number] Date of Birth/Sex: Treating RN: 01-01-1946 (75 y.o. [name] Pop, [name] Primary Care Shearon Clonch: Consuello Masse Other Clinician: Referring Jakai Risse: Treating Quanesha Klimaszewski/Extender: Zadie Cleverly in Treatment: 16 Active Problems Location of Pain Severity and Description of Pain Patient Has Paino No Site Locations Pain Management and Medication Current Pain Management: Electronic Signature(s) Signed: 01/21/2021 5:55:16 PM By: Rhae Hammock RN Entered By: Rhae Hammock on 01/21/2021 09:02:05 -------------------------------------------------------------------------------- Patient/Caregiver Education Details Patient Name: Date of Service: [name] [name] 7/26/2022andnbsp8:45 [place] Record Number: [id number] Patient Account Number: [id number] Date of Birth/Gender: Treating RN: 12-05-1945 (75 y.o. [name] Primary Care Physician: Consuello Masse Other Clinician: Referring Physician: Treating Physician/Extender: Zadie Cleverly in Treatment: 16 Education Assessment Education Provided To: Patient Education Topics Provided Elevated Blood Sugar/ Impact on Healing: Methods: Explain/Verbal Responses: State content correctly Nutrition: Methods: Explain/Verbal Responses: State content correctly Electronic Signature(s) Signed: 01/21/2021 5:55:16 PM By: Rhae Hammock RN Entered By: Rhae Hammock on 01/21/2021 10:15:21 -------------------------------------------------------------------------------- Wound Assessment Details Patient Name: Date of Service: [name] [name] 01/21/2021 8:45 A M Medical Record Number: [id number] Patient Account Number: [id number] Date of  Birth/Sex: Treating RN: 08-11-1945 (75 y.o. [name] Pop, [name] Primary Care Jozlin Bently: Consuello Masse Other Clinician: Referring Aishani Kalis: Treating Jorel Gravlin/Extender: Zadie Cleverly in Treatment: 16 Wound Status Wound Number: 1 Primary Diabetic Wound/Ulcer of the Lower Extremity Etiology: Wound Location: Left, Plantar Metatarsal head fifth Wound Open Wounding Event: Gradually Appeared Status: Date Acquired: 06/29/2020 Comorbid Sleep Apnea, Arrhythmia, Congestive Heart Failure, Coronary Weeks Of Treatment: 16 History: Artery Disease, Type II Diabetes, Osteoarthritis, Neuropathy Clustered Wound: No Photos Wound Measurements Length: (cm) 0.9 Width: (cm) 1.8 Depth: (cm) 0.5 Area: (cm) 1.272 Volume: (cm) 0.636 % Reduction in Area: -1691.5% % Reduction in Volume: -2928.6% Epithelialization: Small (1-33%) Undermining: Yes Starting Position (o'clock): 5 Ending Position (o'clock): 6 Maximum Distance: (cm) 0.6 Wound Description Classification: Grade 3 Wound Margin: Well defined, not attached Exudate Amount: Medium Exudate Type: Serosanguineous Exudate Color: red, brown Foul Odor After Cleansing: No Slough/Fibrino Yes Wound Bed Granulation Amount: Large (67-100%) Exposed Structure Granulation Quality: Red Fascia Exposed: No Necrotic Amount: Small (1-33%) Fat Layer (Subcutaneous Tissue) Exposed: Yes Necrotic Quality: Adherent Slough Tendon Exposed: Yes Muscle Exposed: No Joint Exposed: No Bone Exposed: Yes Assessment Notes macerated periwound. Electronic  Signature(s) Signed: 01/21/2021 5:46:34 PM By: Deon Pilling Signed: 01/21/2021 5:55:16 PM By: Rhae Hammock RN Entered By: Deon Pilling on 01/21/2021 09:05:55 -------------------------------------------------------------------------------- Vitals Details Patient Name: Date of Service: [name] [name] 01/21/2021 8:45 A M Medical Record Number: [id number] Patient Account Number:  [id number] Date of Birth/Sex: Treating RN: 1945-07-04 (75 y.o. [name] Pop, [name] Primary Care Kelcey Korus: Consuello Masse Other Clinician: Referring Charlii Yost: Treating Deztiny Sarra/Extender: Zadie Cleverly in Treatment: 16 Vital Signs Time Taken: 09:02 Temperature (F): 98.3 Height (in): 74 Pulse (bpm): 58 Weight (lbs): 238 Respiratory Rate (breaths/min): 18 Body Mass Index (BMI): 30.6 Blood Pressure (mmHg): 113/68 Capillary Blood Glucose (mg/dl): 122 Reference Range: 80 - 120 mg / dl Electronic Signature(s) Signed: 01/21/2021 5:55:16 PM By: Rhae Hammock RN Entered By: Rhae Hammock on 01/21/2021 09:02:00

## 2021-01-22 DIAGNOSIS — E1142 Type 2 diabetes mellitus with diabetic polyneuropathy: Secondary | ICD-10-CM | POA: Diagnosis not present

## 2021-01-22 DIAGNOSIS — E1169 Type 2 diabetes mellitus with other specified complication: Secondary | ICD-10-CM | POA: Diagnosis not present

## 2021-01-22 DIAGNOSIS — M869 Osteomyelitis, unspecified: Secondary | ICD-10-CM | POA: Diagnosis not present

## 2021-01-22 DIAGNOSIS — L97528 Non-pressure chronic ulcer of other part of left foot with other specified severity: Secondary | ICD-10-CM | POA: Diagnosis not present

## 2021-01-22 DIAGNOSIS — L03116 Cellulitis of left lower limb: Secondary | ICD-10-CM | POA: Diagnosis not present

## 2021-01-22 DIAGNOSIS — E11621 Type 2 diabetes mellitus with foot ulcer: Secondary | ICD-10-CM | POA: Diagnosis not present

## 2021-01-29 ENCOUNTER — Telehealth: Payer: Self-pay

## 2021-01-29 ENCOUNTER — Ambulatory Visit (INDEPENDENT_AMBULATORY_CARE_PROVIDER_SITE_OTHER): Payer: Medicare Other | Admitting: Internal Medicine

## 2021-01-29 ENCOUNTER — Other Ambulatory Visit: Payer: Self-pay

## 2021-01-29 DIAGNOSIS — I255 Ischemic cardiomyopathy: Secondary | ICD-10-CM

## 2021-01-29 DIAGNOSIS — L97528 Non-pressure chronic ulcer of other part of left foot with other specified severity: Secondary | ICD-10-CM | POA: Diagnosis not present

## 2021-01-29 DIAGNOSIS — E1169 Type 2 diabetes mellitus with other specified complication: Secondary | ICD-10-CM | POA: Diagnosis not present

## 2021-01-29 DIAGNOSIS — M86672 Other chronic osteomyelitis, left ankle and foot: Secondary | ICD-10-CM | POA: Diagnosis not present

## 2021-01-29 DIAGNOSIS — L03116 Cellulitis of left lower limb: Secondary | ICD-10-CM | POA: Diagnosis not present

## 2021-01-29 DIAGNOSIS — E11621 Type 2 diabetes mellitus with foot ulcer: Secondary | ICD-10-CM | POA: Diagnosis not present

## 2021-01-29 DIAGNOSIS — M869 Osteomyelitis, unspecified: Secondary | ICD-10-CM | POA: Diagnosis not present

## 2021-01-29 DIAGNOSIS — E1142 Type 2 diabetes mellitus with diabetic polyneuropathy: Secondary | ICD-10-CM | POA: Diagnosis not present

## 2021-01-29 MED ORDER — SULFAMETHOXAZOLE-TRIMETHOPRIM 800-160 MG PO TABS: 1.0000 | ORAL_TABLET | Freq: Two times a day (BID) | ORAL | 0 refills | Status: DC

## 2021-01-29 NOTE — Telephone Encounter (Signed)
Called Mary at [facility] and gave verbal orders to Ladue since IV abx were finished on 01/28/2021. Repeated back to me with understanding.

## 2021-01-29 NOTE — Assessment & Plan Note (Signed)
He is doing much better.  I told him that there is a very good chance that his osteomyelitis has now been cured.  I reviewed management options with him including stopping antibiotics now or converting IV ertapenem to oral trimethoprim/sulfamethoxazole.  He was most comfortable with the latter approach.  I will treat him for 2 more weeks and arrange follow-up at that time.

## 2021-01-29 NOTE — Progress Notes (Signed)
[name]  Patient Active Problem List   Diagnosis Date Noted   Chronic osteomyelitis of left foot ([place]) 01/29/2021    Priority: High   Erectile dysfunction due to arterial insufficiency 06/25/2020   Benign prostatic hyperplasia 05/17/2020   Weak urinary stream 05/17/2020   Atrial fibrillation ([place])    Pseudoclaudication 08/30/2017   Abnormal EKG 08/10/2017   Peripheral arterial disease ([place]) 04/21/2017   Oxygen desaturation A999333   Chronic systolic heart failure ([place]) 05/27/2016   Obesity (BMI 30-39.9) 02/16/2016   Non-ischemic cardiomyopathy ([place]) 12/24/2015   Medication management 12/24/2015   SOB (shortness of breath) 12/24/2015   LV dysfunction    Acute systolic congestive heart failure, NYHA class 3 ([place]) 09/12/2015   OSA (obstructive sleep apnea) 04/17/2015   Spinal stenosis of lumbar region 09/12/2014   Spinal stenosis at L4-L5 level 09/12/2014   Dyspnea 10/13/2013   Fatigue 10/13/2013   Hypotension 08/09/2013   Chest pain 08/09/2013   S/P CABG x 3: (LIMA-LAD, SVG-PD, SVG-PL) 08/04/2011   Coronary artery disease involving native coronary artery of native heart without angina pectoris 07/06/2011   Vasovagal syncope 06/03/2011   Dehydration 06/02/2011   Ischemic cardiomyopathy 06/02/2011   DM2 (diabetes mellitus, type 2) ([place]) 06/02/2011   Essential hypertension 06/02/2011    Patient's Medications  New Prescriptions   SULFAMETHOXAZOLE-TRIMETHOPRIM (BACTRIM DS) 800-160 MG TABLET    Take 1 tablet by mouth 2 (two) times daily.  Previous Medications   ASPIRIN EC 81 MG TABLET    Take 81 mg by mouth at bedtime.    CARVEDILOL (COREG) 6.25 MG TABLET    Take 1 tablet (6.25 mg total) by mouth 2 (two) times daily.   ELIQUIS 5 MG TABS TABLET    Take 1 tablet by mouth twice daily   EMPAGLIFLOZIN (JARDIANCE) 10 MG TABS TABLET    Take 1 tablet (10 mg total) by mouth daily before breakfast.   FUROSEMIDE (LASIX) 40 MG TABLET    Take 40  mg by mouth in the morning.   GABAPENTIN (NEURONTIN) 300 MG CAPSULE    Take 600 mg by mouth at bedtime.   GLIPIZIDE (GLUCOTROL XL) 10 MG 24 HR TABLET    Take 10 mg by mouth at bedtime.   METFORMIN (GLUCOPHAGE-XR) 500 MG 24 HR TABLET    Take 1,000 mg by mouth 2 (two) times daily.   ONETOUCH VERIO TEST STRIP    1 each daily.   ROSUVASTATIN (CRESTOR) 20 MG TABLET    Take 10 mg by mouth every other day. In the evening  Modified Medications   No medications on file  Discontinued Medications   ERTAPENEM (INVANZ) IVPB    Inject 1 g into the vein daily.    Subjective: [name] [name] is in for his routine follow-up visit.  He was seen by my partner, Dr. Carlyle Basques 6 weeks ago for left fifth metatarsal head osteomyelitis complicating an open ulcer where a callus had been debrided.  A recent MRI confirmed osteomyelitis.  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  He has now completed 6 weeks of IV ertapenem.  His PICC had to be replaced at 1 point but otherwise he has had no complications.  He has tolerated ertapenem well.  He has serial photographs taken daily showing steady improvement in his wound.  Review of Systems: Review of Systems  Constitutional:  Negative for fever.  Gastrointestinal:  Negative for abdominal pain, diarrhea, nausea  and vomiting.  Musculoskeletal:  Negative for joint pain.   Past Medical History:  Diagnosis Date   Arthritis    Knee L - probably   Atrial fibrillation (HCC)    BPH (benign prostatic hyperplasia)    CAD (coronary artery disease) 07/06/2011   CHF (congestive heart failure) (HCC)    Coronary artery disease    Diabetes mellitus    Frequency of urination    Heart failure (HCC)    Heart murmur    High cholesterol    History of nuclear stress test 09/2010   dipyridamole; mild perfusion defect due to attenuation with mild superimposed ischemia in apical septal, apical, apical inferior & apical lateral  regions; rest LV enlarged in size; prominent gut uptake in infero-apical region; no significant ischemia demonstrated; low risk scan    HNP (herniated nucleus pulposus), lumbar    Ischemic cardiomyopathy    LV dysfunction 07/06/2011   Nocturia    Right bundle branch block    S/P CABG (coronary artery bypass graft) 08/03/2011   x3; LIMA to LAD,, SVG to PDA, SVG to posterolateral branch of RCA; Dr. Ellison Hughs   Sleep apnea    sleep study 10/2010- AHI during total sleep 32.1/hr and during REM 62.3/hr (severe sleep apnea)unable to tolerate c pap   Spinal stenosis of lumbar region    Stroke [facility])    [place]     Social History   Tobacco Use   Smoking status: Former    Types: Cigars    Quit date: 09/07/2011    Years since quitting: 9.4   Smokeless tobacco: Former    Quit date: 02/28/2012  Substance Use Topics   Alcohol use: Yes    Alcohol/week: 0.0 standard drinks    Comment: occasional   Drug use: No    Family History  Problem Relation Age of Onset   Anesthesia problems Daughter    Cancer Mother    Lung disease Father        & heart disease   Colon cancer Sister    Sudden death Maternal Grandmother     Allergies  Allergen Reactions   Entresto [Sacubitril-Valsartan] Other (See Comments)    dizziness   Sacubitril Other (See Comments)     Dizziness    Objective: Vitals:   01/29/21 0911  BP: 111/66  Pulse: 66  Temp: 97.6 F (36.4 C)  TempSrc: Oral  Weight: 228 lb (103.4 kg)   Body mass index is 29.27 kg/m.  Physical Exam Constitutional:      Comments: He is very calm and pleasant.  He is accompanied by his daughter.  Musculoskeletal:     Comments: He has a small ulcer on the lateral aspect of his left foot over the fifth metatarsal head.  It is very shallow.  There is no surrounding cellulitis or fluctuance.  There is no exposed bone.  There is no drainage or odor.  The ulcer is much smaller and 6 weeks ago.  Skin:    Comments: He has a little bit of clotted blood  around his PICC site.    Lab Results Sed rate and C-reactive protein 01/22/2021 had returned to normal  Sed Rate (mm/h)  Date Value  12/11/2020 33 (H)   CRP (mg/L)  Date Value  12/11/2020 28.2 (H)      Problem List Items Addressed This Visit       High   Chronic osteomyelitis of left foot ([place])    He is doing much better.  I  told him that there is a very good chance that his osteomyelitis has now been cured.  I reviewed management options with him including stopping antibiotics now or converting IV ertapenem to oral trimethoprim/sulfamethoxazole.  He was most comfortable with the latter approach.  I will treat him for 2 more weeks and arrange follow-up at that time.       Relevant Medications   sulfamethoxazole-trimethoprim (BACTRIM DS) 800-160 MG tablet     [name] Bickers, MD [facility] [place] [facility] [phone] pager   [phone] cell 01/29/2021, 9:38 AM

## 2021-01-31 ENCOUNTER — Telehealth: Payer: Self-pay

## 2021-01-31 ENCOUNTER — Other Ambulatory Visit: Payer: Self-pay

## 2021-01-31 MED ORDER — SULFAMETHOXAZOLE-TRIMETHOPRIM 800-160 MG PO TABS: 1.0000 | ORAL_TABLET | Freq: Two times a day (BID) | ORAL | 0 refills | Status: DC

## 2021-01-31 NOTE — Telephone Encounter (Signed)
Patient daughter calling to get one more week of Bactrim. Treatment plan was to finish 2 weeks but only 14 pills were sent in. I sent refill for 14 more pills to [facility] in [place].

## 2021-02-03 DIAGNOSIS — E1169 Type 2 diabetes mellitus with other specified complication: Secondary | ICD-10-CM | POA: Diagnosis not present

## 2021-02-03 DIAGNOSIS — E11621 Type 2 diabetes mellitus with foot ulcer: Secondary | ICD-10-CM | POA: Diagnosis not present

## 2021-02-03 DIAGNOSIS — L03116 Cellulitis of left lower limb: Secondary | ICD-10-CM | POA: Diagnosis not present

## 2021-02-03 DIAGNOSIS — L97528 Non-pressure chronic ulcer of other part of left foot with other specified severity: Secondary | ICD-10-CM | POA: Diagnosis not present

## 2021-02-03 DIAGNOSIS — M869 Osteomyelitis, unspecified: Secondary | ICD-10-CM | POA: Diagnosis not present

## 2021-02-03 DIAGNOSIS — E1142 Type 2 diabetes mellitus with diabetic polyneuropathy: Secondary | ICD-10-CM | POA: Diagnosis not present

## 2021-02-12 DIAGNOSIS — L03116 Cellulitis of left lower limb: Secondary | ICD-10-CM | POA: Diagnosis not present

## 2021-02-12 DIAGNOSIS — E11621 Type 2 diabetes mellitus with foot ulcer: Secondary | ICD-10-CM | POA: Diagnosis not present

## 2021-02-12 DIAGNOSIS — E1169 Type 2 diabetes mellitus with other specified complication: Secondary | ICD-10-CM | POA: Diagnosis not present

## 2021-02-12 DIAGNOSIS — E1142 Type 2 diabetes mellitus with diabetic polyneuropathy: Secondary | ICD-10-CM | POA: Diagnosis not present

## 2021-02-12 DIAGNOSIS — L97528 Non-pressure chronic ulcer of other part of left foot with other specified severity: Secondary | ICD-10-CM | POA: Diagnosis not present

## 2021-02-12 DIAGNOSIS — M869 Osteomyelitis, unspecified: Secondary | ICD-10-CM | POA: Diagnosis not present

## 2021-02-13 ENCOUNTER — Encounter (HOSPITAL_BASED_OUTPATIENT_CLINIC_OR_DEPARTMENT_OTHER): Payer: Medicare Other | Attending: Internal Medicine | Admitting: Internal Medicine

## 2021-02-13 ENCOUNTER — Other Ambulatory Visit: Payer: Self-pay

## 2021-02-13 ENCOUNTER — Encounter: Payer: Self-pay | Admitting: Internal Medicine

## 2021-02-13 ENCOUNTER — Ambulatory Visit (INDEPENDENT_AMBULATORY_CARE_PROVIDER_SITE_OTHER): Payer: Medicare Other | Admitting: Internal Medicine

## 2021-02-13 DIAGNOSIS — E11621 Type 2 diabetes mellitus with foot ulcer: Secondary | ICD-10-CM | POA: Diagnosis not present

## 2021-02-13 DIAGNOSIS — L97522 Non-pressure chronic ulcer of other part of left foot with fat layer exposed: Secondary | ICD-10-CM | POA: Diagnosis not present

## 2021-02-13 DIAGNOSIS — M86672 Other chronic osteomyelitis, left ankle and foot: Secondary | ICD-10-CM | POA: Diagnosis not present

## 2021-02-13 DIAGNOSIS — E1142 Type 2 diabetes mellitus with diabetic polyneuropathy: Secondary | ICD-10-CM | POA: Diagnosis not present

## 2021-02-13 DIAGNOSIS — M86272 Subacute osteomyelitis, left ankle and foot: Secondary | ICD-10-CM | POA: Insufficient documentation

## 2021-02-13 DIAGNOSIS — E1169 Type 2 diabetes mellitus with other specified complication: Secondary | ICD-10-CM | POA: Diagnosis not present

## 2021-02-13 DIAGNOSIS — E1151 Type 2 diabetes mellitus with diabetic peripheral angiopathy without gangrene: Secondary | ICD-10-CM | POA: Insufficient documentation

## 2021-02-13 DIAGNOSIS — I255 Ischemic cardiomyopathy: Secondary | ICD-10-CM

## 2021-02-13 DIAGNOSIS — I10 Essential (primary) hypertension: Secondary | ICD-10-CM | POA: Diagnosis not present

## 2021-02-13 DIAGNOSIS — L97528 Non-pressure chronic ulcer of other part of left foot with other specified severity: Secondary | ICD-10-CM | POA: Insufficient documentation

## 2021-02-13 NOTE — Assessment & Plan Note (Signed)
I am hopeful that his osteomyelitis has been cured through a combination of wound care and 8 weeks of antibiotic therapy.  We will observe off of antibiotic therapy for now and have him follow-up in 6 weeks, sooner if he has any signs or symptoms to suggest relapse.

## 2021-02-13 NOTE — Progress Notes (Signed)
[name]  Patient Active Problem List   Diagnosis Date Noted   Chronic osteomyelitis of left foot ([place]) 01/29/2021    Priority: High   Erectile dysfunction due to arterial insufficiency 06/25/2020   Benign prostatic hyperplasia 05/17/2020   Weak urinary stream 05/17/2020   Atrial fibrillation ([place])    Pseudoclaudication 08/30/2017   Abnormal EKG 08/10/2017   Peripheral arterial disease ([place]) 04/21/2017   Oxygen desaturation A999333   Chronic systolic heart failure ([place]) 05/27/2016   Obesity (BMI 30-39.9) 02/16/2016   Non-ischemic cardiomyopathy ([place]) 12/24/2015   Medication management 12/24/2015   SOB (shortness of breath) 12/24/2015   LV dysfunction    Acute systolic congestive heart failure, NYHA class 3 ([place]) 09/12/2015   OSA (obstructive sleep apnea) 04/17/2015   Spinal stenosis of lumbar region 09/12/2014   Spinal stenosis at L4-L5 level 09/12/2014   Dyspnea 10/13/2013   Fatigue 10/13/2013   Hypotension 08/09/2013   Chest pain 08/09/2013   S/P CABG x 3: (LIMA-LAD, SVG-PD, SVG-PL) 08/04/2011   Coronary artery disease involving native coronary artery of native heart without angina pectoris 07/06/2011   Vasovagal syncope 06/03/2011   Dehydration 06/02/2011   Ischemic cardiomyopathy 06/02/2011   DM2 (diabetes mellitus, type 2) ([place]) 06/02/2011   Essential hypertension 06/02/2011    Patient's Medications  New Prescriptions   No medications on file  Previous Medications   ASPIRIN EC 81 MG TABLET    Take 81 mg by mouth at bedtime.    CARVEDILOL (COREG) 6.25 MG TABLET    Take 1 tablet (6.25 mg total) by mouth 2 (two) times daily.   ELIQUIS 5 MG TABS TABLET    Take 1 tablet by mouth twice daily   EMPAGLIFLOZIN (JARDIANCE) 10 MG TABS TABLET    Take 1 tablet (10 mg total) by mouth daily before breakfast.   FUROSEMIDE (LASIX) 40 MG TABLET    Take 40 mg by mouth in the morning.   GABAPENTIN (NEURONTIN) 300 MG CAPSULE    Take 600 mg  by mouth at bedtime.   GLIPIZIDE (GLUCOTROL XL) 10 MG 24 HR TABLET    Take 10 mg by mouth at bedtime.   METFORMIN (GLUCOPHAGE-XR) 500 MG 24 HR TABLET    Take 1,000 mg by mouth 2 (two) times daily.   ONETOUCH VERIO TEST STRIP    1 each daily.   ROSUVASTATIN (CRESTOR) 20 MG TABLET    Take 10 mg by mouth every other day. In the evening   SULFAMETHOXAZOLE-TRIMETHOPRIM (BACTRIM DS) 800-160 MG TABLET    Take 1 tablet by mouth 2 (two) times daily.  Modified Medications   No medications on file  Discontinued Medications   SULFAMETHOXAZOLE-TRIMETHOPRIM (BACTRIM DS) 800-160 MG TABLET    Take 1 tablet by mouth 2 (two) times daily.    Subjective: Mr. House is in for his routine follow-up visit.  He was seen by my partner, Dr. Carlyle Basques 8 weeks ago for left fifth metatarsal head osteomyelitis complicating an open ulcer where a callus had been debrided.  A recent MRI confirmed osteomyelitis.  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  He completed 6 weeks of IV ertapenem 2 weeks ago before converting to oral trimethoprim/sulfamethoxazole.  He completed that yesterday.  He feels like he is doing much better.  He saw Dr. Dellia Nims at the wound center this morning.  Dr. Dellia Nims agreed that the wound had improved significantly and there is no longer any  need to pursue hyperbaric oxygen therapy.  He did not have any problems tolerating his antibiotics.  Review of Systems: Review of Systems  Constitutional:  Negative for fever.  Gastrointestinal:  Negative for abdominal pain, diarrhea, nausea and vomiting.  Musculoskeletal:  Negative for joint pain.   Past Medical History:  Diagnosis Date   Arthritis    Knee L - probably   Atrial fibrillation (HCC)    BPH (benign prostatic hyperplasia)    CAD (coronary artery disease) 07/06/2011   CHF (congestive heart failure) (HCC)    Coronary artery disease    Diabetes mellitus    Frequency of urination     Heart failure (HCC)    Heart murmur    High cholesterol    History of nuclear stress test 09/2010   dipyridamole; mild perfusion defect due to attenuation with mild superimposed ischemia in apical septal, apical, apical inferior & apical lateral regions; rest LV enlarged in size; prominent gut uptake in infero-apical region; no significant ischemia demonstrated; low risk scan    HNP (herniated nucleus pulposus), lumbar    Ischemic cardiomyopathy    LV dysfunction 07/06/2011   Nocturia    Right bundle branch block    S/P CABG (coronary artery bypass graft) 08/03/2011   x3; LIMA to LAD,, SVG to PDA, SVG to posterolateral branch of RCA; Dr. Ellison Hughs   Sleep apnea    sleep study 10/2010- AHI during total sleep 32.1/hr and during REM 62.3/hr (severe sleep apnea)unable to tolerate c pap   Spinal stenosis of lumbar region    Stroke [facility])    [place]     Social History   Tobacco Use   Smoking status: Former    Types: Cigars    Quit date: 09/07/2011    Years since quitting: 9.4   Smokeless tobacco: Former    Quit date: 02/28/2012  Substance Use Topics   Alcohol use: Yes    Alcohol/week: 0.0 standard drinks    Comment: occasional   Drug use: No    Family History  Problem Relation Age of Onset   Anesthesia problems Daughter    Cancer Mother    Lung disease Father        & heart disease   Colon cancer Sister    Sudden death Maternal Grandmother     Allergies  Allergen Reactions   Entresto [Sacubitril-Valsartan] Other (See Comments)    dizziness   Sacubitril Other (See Comments)     Dizziness    Objective: Vitals:   02/13/21 1049  BP: 111/69  Pulse: 64  Temp: (!) 97.4 F (36.3 C)  TempSrc: Oral  SpO2: 99%  Weight: 224 lb (101.6 kg)   Body mass index is 28.76 kg/m.  Physical Exam Constitutional:      Comments: He is in good spirits.  Musculoskeletal:     Comments: His left foot was just wrapped at the wound center.  He has a picture taken an hour ago which shows the  ulcer on the lateral aspect of his foot is clean with red granulation tissue.  There is no exposed bone.  He says that there has been no significant drainage and no odor.    Lab Results Sed rate and C-reactive protein 01/22/2021 had returned to normal  Sed Rate (mm/h)  Date Value  12/11/2020 33 (H)   CRP (mg/L)  Date Value  12/11/2020 28.2 (H)      Problem List Items Addressed This Visit       High  Chronic osteomyelitis of left foot (HCC)    I am hopeful that his osteomyelitis has been cured through a combination of wound care and 8 weeks of antibiotic therapy.  We will observe off of antibiotic therapy for now and have him follow-up in 6 weeks, sooner if he has any signs or symptoms to suggest relapse.       Michel Bickers, MD [facility] [place] [facility] [phone] pager   [phone] cell 02/13/2021, 11:08 AM

## 2021-02-13 NOTE — Progress Notes (Signed)
TYCEN, DOCKTER ([id number]) Visit Report for 02/13/2021 HPI Details Patient Name: Date of Service: [name] [name], [name] [name] 02/13/2021 9:30 A M Medical Record Number: [id number] Patient Account Number: [id number] Date of Birth/Sex: Treating RN: 1945-12-23 (75 y.o. [name] Primary Care Provider: Consuello Masse Other Clinician: Referring Provider: Treating Provider/Extender: Zadie Cleverly in Treatment: 19 History of Present Illness HPI Description: ADMISSION 09/27/2020 This is a 75 year old man who lives in [place]. He apparently has had callus over the plantar fifth metatarsal head in the past for which she is followed by podiatry in [place]. They shaved down the callus and this was done in January. He states that he went to [place] in January and became aware of when he was there of an open wound in this area. He followed up with podiatry and has been soaking this twice a day with Epson salts and using Silvadene. Apparently one of the podiatrist told him he may need surgery because of bone protrusion. Past medical history includes an ischemic cardiomyopathy, type 2 diabetes with peripheral neuropathy, BPH, PVD, orthostatic hypotension, atrial fibrillation, hyperlipidemia and hypertension Arterial studies in 2018 showed a noncompressible ABI on the left. It was again noncompressible today at 1.7 although his pulses are easily palpable 4/8; patient I admitted to the clinic last week. Wound on the plantar left fifth metatarsal head which has been refractory. He also has a bit of subluxation of the bone in the head. He is a type II diabetic with peripheral neuropathy. We used silver collagen after debridement He arrives back in clinic today. He has erythema spreading into the lateral part of the fifth metatarsal head. I removed some undermining tissue from around the wound and clearly there is a connection between the wound and the lateral part of the fifth met head. A  culture was done. Area of erythema on the lateral part of the foot was marked. His wife says that has been there since Wednesday 4/15; the patient arrived last week with erythema spreading in the lateral part of the fifth metatarsal head. There was a wound connected with the plantar fifth met head original wound. I gave him empiric Augmentin. Surprisingly the culture I did was negative. We have been using silver alginate. The wound has been open since January. He arrives in clinic today with the erythema much better 4/22; patient presents today for 1 week follow-up of his fifth metatarsal head wound. Daughter is present with the patient. She states that the wound has improved significantly since 2 weeks ago. There is no longer redness to the foot and the actual wound bed is smaller. Patient overall feels well and has no complaints today. 4/29; patient presents for 1 week follow-up of his fifth metatarsal head wound. Daughter is present. Patient has been using silver alginate every other day to the wound site. He has tried to relieve pressure with a surgical shoe. He denies signs of infections. 5/6; fifth metatarsal head wound. He has been using silver alginate changed to Santyl last week which he managed to obtain for $21 with a coupon we gave him. He also has an appointment with vein and vascular. I think Dr. Heber [place] referred him. He has noncompressible ABIs. The patient also had an x-ray and that was negative 5/13; the patient went to see [name]. Areas on the left fifth metatarsal head plantar and lateral. [name] noted that his ABI in the right was 0.99 but monophasic waveforms with a TBI at 0.32.  He wanted to go ahead with an angiogram which is booked for Thursday. I talked to the patient about this and advised him to go forward with this. 5/23; patient presents for 1 week follow-up. He has been using silver alginate to the wound bed. He has no complaints today. Denies signs of infection. He  had an arteriogram done on 5/19. 5/31; angiogram showed patent common femoral, SFA, profunda, above-knee popliteal artery. 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 He does have inline flow down the left lower extremity through the peroneal that is widely patent. The peroneal gave off collaterals distally to the anterior tibial that feels retrograde at the ankle. An attempted recanalization of the left anterior tibial artery was made but was not successful. The plan as outlined by [name] is that he will continue getting aggressive wound care and he would consider a retrograde left anterior tibial intervention if there is no improvement. Wound itself does not look as though it is making any improvement. There is still tunneling superiorly precariously close to bone 6/3; back for the obligatory first total contact cast change. The wound clearly has exposed bone. He is a type II diabetic. He has been revascularized. Plain x- ray did not show osteomyelitis he may require an MRI. After his revascularization I elected to put him into a cast to make sure that all the pressure was coming off this area 6/7; patient back for his routine visit. The wound is not doing well. The patient had a lot of discomfort since he was last here 4 days ago. No systemic illness. He will not go back in the total contact cast today. 6/14; culture I did last time showed Enterobacter. At the time there was erythema and swelling I put him on Augmentin and Bactrim empirically. The Enterobacter was sensitive to the Bactrim DS. He sees [name] tomorrow. I am looking for her recommendation about antibiotic choices and route of administration. He sees [name] of vascular surgery on 6/21. I have communicated with [name] I would like to see whether or not further revascularization will be entertained and whether [name] feels that he could  survive a foot conserving surgery if that is the route the patient wishes to go. After these next 2 consultations we may be able to discuss the route the patient wants to take either a surgical 1 or perhaps 1 that involves a prolonged course of antibiotics and hyperbaric oxygen. I touch bases with the patient and his daughter on all of this. MRI suggested osteomyelitis in the distal 2 cm of the fifth metatarsal 6/24; the patient was kindly seen by [name] of infectious disease by telehealth visit. He has established a PICC line and is on a Carbapenem for 4 weeks although I am not really sure which 1. Initial culture was Enterobacter I believe this was a swab. He is also followed up with [name]. He again is considering an anterograde anterior tibial attempted revascularization. The patient is to call if they agree to move forward with the anterograde attempt. He started an IV antibiotics already. He arrives in clinic today with again the wound deteriorating. There is exposed bone widely which is quite a bit worse than last week. I did obtain a piece of this for culture but more bone debridement is likely going to need to be done. I had an extensive discussion with the patient and his daughter.  The problem is the ray amputation success depends largely on blood flow. [name] did not sound optimistic about this. However I would definitely go ahead with the retrograde attempt by [name] even though there are potential risks. I explained this to the patient. If we are going to have any attempt to salvage this man's foot we are going to need blood flow and I would go ahead with this. IV antibiotics in the meantime and consideration of hyperbaric oxygen all of these things I discussed. The option would be a below-knee amputation which the patient does not seem ready for his daughter is certainly not ready for. But we also talked about this 6/30; bone culture I did last week showed the same  Enterobacter cloacae I as previous. Also rare Staph epidermidis. I am not really sure of the significance of this which may be a contamination. He has a anterior grade access attempt by [name] of vein and vascular on 7/7. After we see the result of this I will go over his options which are continued antibiotics and hyperbarics versus an attempt at a ray amputation if [name] is successful in establishing more blood flow here. 7/12 the patient saw [name] yesterday who extended his IV antibiotics. He also had an attempt at left anterior tibial artery retrograde revascularization by [name] however this was not successful. We are therefore looking at the same blood flow that we had previously. I had an extensive discussion with him today. I think he has a good chance that healing ray amputation. The other option is to continue his IV antibiotics and initiate hyperbaric oxygen therapy. The patient seems to be opting for the latter 7/19; continues on IV antibiotics per the patient until August 3. He is going to wait to [facility] sometime after that. He is approved for HBO but I do not think we will be able to get that going until sometime in the August 20 21st range per he has been using silver alginate 7/26; continues on IV antibiotics as directed by [name]. This will apparently end on August 3 and then the patient thinks he will go on oral antibiotics after that. The patient is leaving on vacation from 8/10 through 8/17. There is no point in putting him through hyperbarics at this point. His wound has improved he has been using silver alginate at 1 point about 33% of this was exposed bone all of this is covered by granulation now. 8/18; patient was away on vacation therefore we did not initiate HBO. He has completed IV antibiotics as well as 2 weeks of oral Septra as directed by Dr. Megan Salon. He is follows up tomorrow and expects to be discharged. He has been using silver  alginate. The wound measures smaller Electronic Signature(s) Signed: 02/13/2021 4:52:40 PM By: Linton Ham MD Entered By: Linton Ham on 02/13/2021 10:20:25 -------------------------------------------------------------------------------- Physical Exam Details Patient Name: Date of Service: [name] [name] 02/13/2021 9:30 A M Medical Record Number: [id number] Patient Account Number: [id number] Date of Birth/Sex: Treating RN: June 20, 1946 (75 y.o. [name] Primary Care Provider: Consuello Masse Other Clinician: Referring Provider: Treating Provider/Extender: Zadie Cleverly in Treatment: 19 Constitutional Sitting or standing Blood Pressure is within target range for patient.. Pulse regular and within target range for patient.Marland Kitchen Respirations regular, non-labored and within target range.. Temperature is normal and within the target range for the patient.Marland Kitchen Appears in no distress. Cardiovascular Pedal pulses are palpable. Notes Wound exam;  left lateral fifth metatarsal head. The wound does look smaller healthy granulation it seems to have filled in and the more proximal part of the wound nevertheless there is still undermining towards the plantar aspect. There is certainly no exposed bone and no overt evidence of infection. No erythema Electronic Signature(s) Signed: 02/13/2021 4:52:40 PM By: Linton Ham MD Entered By: Linton Ham on 02/13/2021 10:21:29 -------------------------------------------------------------------------------- Physician Orders Details Patient Name: Date of Service: [name] [name] 02/13/2021 9:30 A M Medical Record Number: [id number] Patient Account Number: [id number] Date of Birth/Sex: Treating RN: 1945-11-13 (74 y.o. Janyth Contes Primary Care Provider: Consuello Masse Other Clinician: Referring Provider: Treating Provider/Extender: Zadie Cleverly in Treatment: 19 Verbal / Phone Orders: No Diagnosis  Coding ICD-10 Coding Code Description E11.621 Type 2 diabetes mellitus with foot ulcer E11.51 Type 2 diabetes mellitus with diabetic peripheral angiopathy without gangrene L97.528 Non-pressure chronic ulcer of other part of left foot with other specified severity M86.272 Subacute osteomyelitis, left ankle and foot E11.42 Type 2 diabetes mellitus with diabetic polyneuropathy Follow-up Appointments ppointment in 2 weeks. - with Dr. Dellia Nims Return A Bathing/ Shower/ Hygiene May shower and wash wound with soap and water. - on days changing dressing. Do not soak foot. Edema Control - Lymphedema / SCD / Other Elevate legs to the level of the heart or above for 30 minutes daily and/or when sitting, a frequency of: - throughout the day Avoid standing for long periods of time. Exercise regularly Off-Loading Open toe surgical shoe to: - left foot Additional Orders / Instructions Follow Nutritious Diet Wound Treatment Wound #1 - Metatarsal head fifth Wound Laterality: Plantar, Left Cleanser: Normal Saline (DME) (Generic) Every Other Day/30 Days Discharge Instructions: Cleanse the wound with Normal Saline prior to applying a clean dressing using gauze sponges, not tissue or cotton balls. Cleanser: Soap and Water Every Other Day/30 Days Discharge Instructions: May shower and wash wound with dial antibacterial soap and water prior to dressing change. Cleanser: Wound Cleanser (Generic) Every Other Day/30 Days Discharge Instructions: Cleanse the wound with wound cleanser prior to applying a clean dressing using gauze sponges, not tissue or cotton balls. Prim Dressing: Promogran Prisma Matrix, 4.34 (sq in) (silver collagen) (DME) (Generic) Every Other Day/30 Days ary Discharge Instructions: Moisten collagen with saline or hydrogel Secondary Dressing: Woven Gauze Sponges 2x2 in (DME) (Generic) Every Other Day/30 Days Discharge Instructions: Apply over primary dressing as directed. Secondary Dressing:  Bordered Gauze, 2x3.75 in (DME) (Generic) Every Other Day/30 Days Discharge Instructions: Apply over primary dressing as directed. Secured With: [employer], 4.5x3.1 (in/yd) Every Other Day/30 Days Discharge Instructions: Secure with Kerlix as directed. Secured With: 37M Medipore H Soft Cloth Surgical Tape, 2x2 (in/yd) (DME) (Generic) Every Other Day/30 Days Discharge Instructions: Secure dressing with tape as directed. Electronic Signature(s) Signed: 02/13/2021 4:52:40 PM By: Linton Ham MD Signed: 02/13/2021 6:20:17 PM By: Levan Hurst RN, BSN Entered By: Levan Hurst on 02/13/2021 10:25:17 -------------------------------------------------------------------------------- Problem List Details Patient Name: Date of Service: [name] [name] 02/13/2021 9:30 A M Medical Record Number: [id number] Patient Account Number: [id number] Date of Birth/Sex: Treating RN: [birth year] (74 y.o. Janyth Contes Primary Care Provider: Consuello Masse Other Clinician: Referring Provider: Treating Provider/Extender: Zadie Cleverly in Treatment: 19 Active Problems ICD-10 Encounter Code Description Active Date MDM Diagnosis E11.621 Type 2 diabetes mellitus with foot ulcer 09/27/2020 No Yes E11.51 Type 2 diabetes mellitus with diabetic peripheral angiopathy without gangrene 11/11/2020 No Yes L97.528 Non-pressure chronic ulcer of  other part of left foot with other specified 09/27/2020 No Yes severity M86.272 Subacute osteomyelitis, left ankle and foot 12/10/2020 No Yes E11.42 Type 2 diabetes mellitus with diabetic polyneuropathy 09/27/2020 No Yes Inactive Problems ICD-10 Code Description Active Date Inactive Date L03.116 Cellulitis of left lower limb 10/04/2020 10/04/2020 Resolved Problems Electronic Signature(s) Signed: 02/13/2021 4:52:40 PM By: Linton Ham MD Entered By: Linton Ham on 02/13/2021  10:17:31 -------------------------------------------------------------------------------- Progress Note Details Patient Name: Date of Service: [name] [name] 02/13/2021 9:30 A M Medical Record Number: [id number] Patient Account Number: [id number] Date of Birth/Sex: Treating RN: [birth year] (75 y.o. [name] Primary Care Provider: Consuello Masse Other Clinician: Referring Provider: Treating Provider/Extender: Zadie Cleverly in Treatment: 19 Subjective History of Present Illness (HPI) ADMISSION 09/27/2020 This is a 75 year old man who lives in [place]. He apparently has had callus over the plantar fifth metatarsal head in the past for which she is followed by podiatry in [place]. They shaved down the callus and this was done in January. He states that he went to [place] in January and became aware of when he was there of an open wound in this area. He followed up with podiatry and has been soaking this twice a day with Epson salts and using Silvadene. Apparently one of the podiatrist told him he may need surgery because of bone protrusion. Past medical history includes an ischemic cardiomyopathy, type 2 diabetes with peripheral neuropathy, BPH, PVD, orthostatic hypotension, atrial fibrillation, hyperlipidemia and hypertension Arterial studies in 2018 showed a noncompressible ABI on the left. It was again noncompressible today at 1.7 although his pulses are easily palpable 4/8; patient I admitted to the clinic last week. Wound on the plantar left fifth metatarsal head which has been refractory. He also has a bit of subluxation of the bone in the head. He is a type II diabetic with peripheral neuropathy. We used silver collagen after debridement He arrives back in clinic today. He has erythema spreading into the lateral part of the fifth metatarsal head. I removed some undermining tissue from around the wound and clearly there is a connection between the  wound and the lateral part of the fifth met head. A culture was done. Area of erythema on the lateral part of the foot was marked. His wife says that has been there since Wednesday 4/15; the patient arrived last week with erythema spreading in the lateral part of the fifth metatarsal head. There was a wound connected with the plantar fifth met head original wound. I gave him empiric Augmentin. Surprisingly the culture I did was negative. We have been using silver alginate. The wound has been open since January. He arrives in clinic today with the erythema much better 4/22; patient presents today for 1 week follow-up of his fifth metatarsal head wound. Daughter is present with the patient. She states that the wound has improved significantly since 2 weeks ago. There is no longer redness to the foot and the actual wound bed is smaller. Patient overall feels well and has no complaints today. 4/29; patient presents for 1 week follow-up of his fifth metatarsal head wound. Daughter is present. Patient has been using silver alginate every other day to the wound site. He has tried to relieve pressure with a surgical shoe. He denies signs of infections. 5/6; fifth metatarsal head wound. He has been using silver alginate changed to Santyl last week which he managed to obtain for $21 with a coupon we gave him. He also  has an appointment with vein and vascular. I think Dr. Heber [place] referred him. He has noncompressible ABIs. The patient also had an x-ray and that was negative 5/13; the patient went to see [name]. Areas on the left fifth metatarsal head plantar and lateral. [name] noted that his ABI in the right was 0.99 but monophasic waveforms with a TBI at 0.32. He wanted to go ahead with an angiogram which is booked for Thursday. I talked to the patient about this and advised him to go forward with this. 5/23; patient presents for 1 week follow-up. He has been using silver alginate to the wound bed. He  has no complaints today. Denies signs of infection. He had an arteriogram done on 5/19. 5/31; angiogram showed patent common femoral, SFA, profunda, above-knee popliteal artery. 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 He does have inline flow down the left lower extremity through the peroneal that is widely patent. The peroneal gave off collaterals distally to the anterior tibial that feels retrograde at the ankle. An attempted recanalization of the left anterior tibial artery was made but was not successful. The plan as outlined by [name] is that he will continue getting aggressive wound care and he would consider a retrograde left anterior tibial intervention if there is no improvement. Wound itself does not look as though it is making any improvement. There is still tunneling superiorly precariously close to bone 6/3; back for the obligatory first total contact cast change. The wound clearly has exposed bone. He is a type II diabetic. He has been revascularized. Plain x- ray did not show osteomyelitis he may require an MRI. After his revascularization I elected to put him into a cast to make sure that all the pressure was coming off this area 6/7; patient back for his routine visit. The wound is not doing well. The patient had a lot of discomfort since he was last here 4 days ago. No systemic illness. He will not go back in the total contact cast today. 6/14; culture I did last time showed Enterobacter. At the time there was erythema and swelling I put him on Augmentin and Bactrim empirically. The Enterobacter was sensitive to the Bactrim DS. He sees [name] tomorrow. I am looking for her recommendation about antibiotic choices and route of administration. He sees [name] of vascular surgery on 6/21. I have communicated with [name] I would like to see whether or not further revascularization will be  entertained and whether [name] feels that he could survive a foot conserving surgery if that is the route the patient wishes to go. After these next 2 consultations we may be able to discuss the route the patient wants to take either a surgical 1 or perhaps 1 that involves a prolonged course of antibiotics and hyperbaric oxygen. I touch bases with the patient and his daughter on all of this. MRI suggested osteomyelitis in the distal 2 cm of the fifth metatarsal 6/24; the patient was kindly seen by [name] of infectious disease by telehealth visit. He has established a PICC line and is on a Carbapenem for 4 weeks although I am not really sure which 1. Initial culture was Enterobacter I believe this was a swab. He is also followed up with [name]. He again is considering an anterograde anterior tibial attempted revascularization. The patient is to call if they agree to move forward with the  anterograde attempt. He started an IV antibiotics already. He arrives in clinic today with again the wound deteriorating. There is exposed bone widely which is quite a bit worse than last week. I did obtain a piece of this for culture but more bone debridement is likely going to need to be done. I had an extensive discussion with the patient and his daughter. The problem is the ray amputation success depends largely on blood flow. [name] did not sound optimistic about this. However I would definitely go ahead with the retrograde attempt by [name] even though there are potential risks. I explained this to the patient. If we are going to have any attempt to salvage this man's foot we are going to need blood flow and I would go ahead with this. IV antibiotics in the meantime and consideration of hyperbaric oxygen all of these things I discussed. The option would be a below-knee amputation which the patient does not seem ready for his daughter is certainly not ready for. But we also talked about this 6/30;  bone culture I did last week showed the same Enterobacter cloacae I as previous. Also rare Staph epidermidis. I am not really sure of the significance of this which may be a contamination. He has a anterior grade access attempt by [name] of vein and vascular on 7/7. After we see the result of this I will go over his options which are continued antibiotics and hyperbarics versus an attempt at a ray amputation if [name] is successful in establishing more blood flow here. 7/12 the patient saw [name] yesterday who extended his IV antibiotics. He also had an attempt at left anterior tibial artery retrograde revascularization by [name] however this was not successful. We are therefore looking at the same blood flow that we had previously. I had an extensive discussion with him today. I think he has a good chance that healing ray amputation. The other option is to continue his IV antibiotics and initiate hyperbaric oxygen therapy. The patient seems to be opting for the latter 7/19; continues on IV antibiotics per the patient until August 3. He is going to wait to [facility] sometime after that. He is approved for HBO but I do not think we will be able to get that going until sometime in the August 20 21st range per he has been using silver alginate 7/26; continues on IV antibiotics as directed by [name]. This will apparently end on August 3 and then the patient thinks he will go on oral antibiotics after that. The patient is leaving on vacation from 8/10 through 8/17. There is no point in putting him through hyperbarics at this point. His wound has improved he has been using silver alginate at 1 point about 33% of this was exposed bone all of this is covered by granulation now. 8/18; patient was away on vacation therefore we did not initiate HBO. He has completed IV antibiotics as well as 2 weeks of oral Septra as directed by Dr. Megan Salon. He is follows up tomorrow and expects to be  discharged. He has been using silver alginate. The wound measures smaller Objective Constitutional Sitting or standing Blood Pressure is within target range for patient.. Pulse regular and within target range for patient.Marland Kitchen Respirations regular, non-labored and within target range.. Temperature is normal and within the target range for the patient.Marland Kitchen Appears in no distress. Vitals Time Taken: 9:42 AM, Height: 74 in, Weight: 238 lbs, BMI: 30.6, Temperature: 98.4  F, Pulse: 67 bpm, Respiratory Rate: 18 breaths/min, Blood Pressure: 113/69 mmHg, Capillary Blood Glucose: 138 mg/dl. Cardiovascular Pedal pulses are palpable. General Notes: Wound exam; left lateral fifth metatarsal head. The wound does look smaller healthy granulation it seems to have filled in and the more proximal part of the wound nevertheless there is still undermining towards the plantar aspect. There is certainly no exposed bone and no overt evidence of infection. No erythema Integumentary (Hair, Skin) Wound #1 status is Open. Original cause of wound was Gradually Appeared. The date acquired was: 06/29/2020. The wound has been in treatment 19 weeks. The wound is located on the Left,Plantar Metatarsal head fifth. The wound measures 0.6cm length x 1cm width x 0.5cm depth; 0.471cm^2 area and 0.236cm^3 volume. There is tendon and Fat Layer (Subcutaneous Tissue) exposed. There is no tunneling noted, however, there is undermining starting at 3:00 and ending at 5:00 with a maximum distance of 0.3cm. There is a medium amount of serosanguineous drainage noted. The wound margin is well defined and not attached to the wound base. There is large (67-100%) red granulation within the wound bed. There is a small (1-33%) amount of necrotic tissue within the wound bed including Adherent Slough. 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 - with Dr. Dellia Nims Bathing/ Shower/ Hygiene: May shower and wash wound with soap and water. - on days changing dressing. Do not soak foot. Edema Control - Lymphedema / SCD / Other: Elevate legs to the level of the heart or above for 30 minutes daily and/or when sitting, a frequency of: - throughout the day Avoid standing for long periods of time. Exercise regularly Off-Loading: Open toe surgical shoe to: - left foot Additional Orders / Instructions: Follow Nutritious Diet WOUND #1: - Metatarsal head fifth Wound Laterality: Plantar, Left Cleanser: Normal Saline (DME) (Generic) Every Other Day/30 Days Discharge Instructions: Cleanse the wound with Normal Saline prior to applying a clean dressing using gauze sponges, not tissue or cotton balls. Cleanser: Soap and Water Every Other Day/30 Days Discharge Instructions: May shower and wash wound with dial antibacterial soap and water prior to dressing change. Cleanser: Wound Cleanser (Generic) Every Other Day/30 Days Discharge Instructions: Cleanse the wound with wound cleanser prior to applying a clean dressing using gauze sponges, not tissue or cotton balls. Prim Dressing: Promogran Prisma Matrix, 4.34 (sq in) (silver collagen) (DME) (Generic) Every Other Day/30 Days ary Discharge Instructions: Moisten collagen with saline or hydrogel Secondary Dressing: Woven Gauze Sponges 2x2 in (DME) (Generic) Every Other Day/30 Days Discharge Instructions: Apply over primary dressing as directed. Secondary Dressing: Bordered Gauze, 2x3.75 in (DME) (Generic) Every Other Day/30 Days Discharge Instructions: Apply over primary dressing as directed. #1 I change the primary dressing to silver collagen moistened of  course. I am trying to get some filling in of the undermining area. 2. He has completed antibiotics for the underlying osteomyelitis 3. I am simply going to monitor this now hopefully as the wound progresses towards closure. I have discussed with him that we will not reintroduce HBO unless the wound worsens or there is resurfacing of concerned about infection. Nevertheless observation of this area will need to go forward for the better part of the next 10 to  12 months before we consider the osteomyelitis "healed" 4. Reminded him about offloading this area he is using a healing sandal although the wound is mostly on the lateral part of the foot Electronic Signature(s) Signed: 02/13/2021 4:52:40 PM By: Linton Ham MD Entered By: Linton Ham on 02/13/2021 10:23:43 -------------------------------------------------------------------------------- SuperBill Details Patient Name: Date of Service: [name] [name] 02/13/2021 Medical Record Number: [id number] Patient Account Number: [id number] Date of Birth/Sex: Treating RN: [birth year] (74 y.o. [name], Meta.Reding Primary Care Provider: Consuello Masse Other Clinician: Referring Provider: Treating Provider/Extender: Zadie Cleverly in Treatment: 19 Diagnosis Coding ICD-10 Codes Code Description E11.621 Type 2 diabetes mellitus with foot ulcer E11.51 Type 2 diabetes mellitus with diabetic peripheral angiopathy without gangrene L97.528 Non-pressure chronic ulcer of other part of left foot with other specified severity M86.272 Subacute osteomyelitis, left ankle and foot E11.42 Type 2 diabetes mellitus with diabetic polyneuropathy Facility Procedures CPT4 Code: 03496116 Description: 99213 - WOUND CARE VISIT-LEV 3 EST PT Modifier: Quantity: 1 Physician Procedures : CPT4 Code Description Modifier [id number] 25834 - WC PHYS LEVEL 3 - EST PT ICD-10 Diagnosis Description L97.528 Non-pressure chronic ulcer of other part of left foot  with other specified severity E11.621 Type 2 diabetes mellitus with foot ulcer Quantity: 1 Electronic Signature(s) Signed: 02/13/2021 4:52:40 PM By: Linton Ham MD Signed: 02/13/2021 6:20:17 PM By: Levan Hurst RN, BSN Entered By: Levan Hurst on 02/13/2021 10:26:57

## 2021-02-17 DIAGNOSIS — D044 Carcinoma in situ of skin of scalp and neck: Secondary | ICD-10-CM | POA: Diagnosis not present

## 2021-02-17 DIAGNOSIS — D225 Melanocytic nevi of trunk: Secondary | ICD-10-CM | POA: Diagnosis not present

## 2021-02-19 NOTE — Progress Notes (Signed)
[name] [name] ([id number]) Visit Report for 02/13/2021 Arrival Information Details Patient Name: Date of Service: [name] [name], [name] [name] 02/13/2021 9:30 A M Medical Record Number: [id number] Patient Account Number: [id number] Date of Birth/Sex: Treating RN: 1946-04-16 (75 y.o. [name] [name], [name] [name] Primary Care [name] [name]: [name] [name] Other Clinician: Referring [name] [name]: Treating [name] [name]/Extender: [name] [name] in Treatment: 45 Visit Information History Since Last Visit Added or deleted any medications: No Patient Arrived: Ambulatory Any new allergies or adverse reactions: No Arrival Time: 09:41 Had a fall or experienced change in No Accompanied By: daughter activities of daily living that may affect Transfer Assistance: None risk of falls: Patient Identification Verified: Yes Signs or symptoms of abuse/neglect since last visito No Secondary Verification Process Completed: Yes Hospitalized since last visit: No Patient Requires Transmission-Based Precautions: No Implantable device outside of the clinic excluding No Patient Has Alerts: No cellular tissue based products placed in the center since last visit: Has Dressing in Place as Prescribed: Yes Pain Present Now: No Electronic Signature(s) Signed: 02/19/2021 9:12:52 AM By: Sandre Kitty Entered By: Sandre Kitty on 02/13/2021 09:42:35 -------------------------------------------------------------------------------- Clinic Level of Care Assessment Details Patient Name: Date of Service: [name] [name] 02/13/2021 9:30 A M Medical Record Number: [id number] Patient Account Number: [id number] Date of Birth/Sex: Treating RN: Feb 14, 1946 (75 y.o. [name] [name] Primary Care [name] [name]: [name] [name] Other Clinician: Referring [name] [name]: Treating [name] [name]/Extender: [name] [name] in Treatment: 19 Clinic Level of Care Assessment Items TOOL 4 Quantity Score X- 1 0 Use when only an EandM is  performed on FOLLOW-UP visit ASSESSMENTS - Nursing Assessment / Reassessment X- 1 10 Reassessment of Co-morbidities (includes updates in patient status) X- 1 5 Reassessment of Adherence to Treatment Plan ASSESSMENTS - Wound and Skin A ssessment / Reassessment X - Simple Wound Assessment / Reassessment - one wound 1 5 []  - 0 Complex Wound Assessment / Reassessment - multiple wounds []  - 0 Dermatologic / Skin Assessment (not related to wound area) ASSESSMENTS - Focused Assessment []  - 0 Circumferential Edema Measurements - multi extremities []  - 0 Nutritional Assessment / Counseling / Intervention X- 1 5 Lower Extremity Assessment (monofilament, tuning fork, pulses) []  - 0 Peripheral Arterial Disease Assessment (using hand held doppler) ASSESSMENTS - Ostomy and/or Continence Assessment and Care []  - 0 Incontinence Assessment and Management []  - 0 Ostomy Care Assessment and Management (repouching, etc.) PROCESS - Coordination of Care X - Simple Patient / Family Education for ongoing care 1 15 []  - 0 Complex (extensive) Patient / Family Education for ongoing care X- 1 10 Staff obtains Programmer, systems, Records, T Results / Process Orders est []  - 0 Staff telephones HHA, Nursing Homes / Clarify orders / etc []  - 0 Routine Transfer to another Facility (non-emergent condition) []  - 0 Routine Hospital Admission (non-emergent condition) []  - 0 New Admissions / Biomedical engineer / Ordering NPWT Apligraf, etc. , []  - 0 Emergency Hospital Admission (emergent condition) X- 1 10 Simple Discharge Coordination []  - 0 Complex (extensive) Discharge Coordination PROCESS - Special Needs []  - 0 Pediatric / Minor Patient Management []  - 0 Isolation Patient Management []  - 0 Hearing / Language / Visual special needs []  - 0 Assessment of Community assistance (transportation, D/C planning, etc.) []  - 0 Additional assistance / Altered mentation []  - 0 Support Surface(s) Assessment  (bed, cushion, seat, etc.) INTERVENTIONS - Wound Cleansing / Measurement X - Simple Wound Cleansing - one wound 1 5 []  - 0 Complex Wound Cleansing - multiple wounds  X- 1 5 Wound Imaging (photographs - any number of wounds) $RemoveBe'[]'amxUaCqGW$  - 0 Wound Tracing (instead of photographs) X- 1 5 Simple Wound Measurement - one wound $RemoveB'[]'IINRPNdJ$  - 0 Complex Wound Measurement - multiple wounds INTERVENTIONS - Wound Dressings $RemoveBeforeD'[]'SieLciwqQpUzDa$  - 0 Small Wound Dressing one or multiple wounds X- 1 15 Medium Wound Dressing one or multiple wounds $RemoveBeforeD'[]'WXqmahhRARoSWU$  - 0 Large Wound Dressing one or multiple wounds X- 1 5 Application of Medications - topical $RemoveB'[]'eCmCkjWi$  - 0 Application of Medications - injection INTERVENTIONS - Miscellaneous $RemoveBeforeD'[]'gPXuDPxffTVrdt$  - 0 External ear exam $Remove'[]'IYbykjs$  - 0 Specimen Collection (cultures, biopsies, blood, body fluids, etc.) $RemoveBefor'[]'FZfNkskglADI$  - 0 Specimen(s) / Culture(s) sent or taken to Lab for analysis $RemoveBefo'[]'MmJAXrDvjvP$  - 0 Patient Transfer (multiple staff / Civil Service fast streamer / Similar devices) $RemoveBeforeDE'[]'hefxbuCOuHUtExm$  - 0 Simple Staple / Suture removal (25 or less) $Remove'[]'sPsFExY$  - 0 Complex Staple / Suture removal (26 or more) $Remove'[]'NRnbJxC$  - 0 Hypo / Hyperglycemic Management (close monitor of Blood Glucose) $RemoveBefore'[]'RoTQLddUIZUWE$  - 0 Ankle / Brachial Index (ABI) - do not check if billed separately X- 1 5 Vital Signs Has the patient been seen at the hospital within the last three years: Yes Total Score: 100 Level Of Care: New/Established - Level 3 Electronic Signature(s) Signed: 02/13/2021 6:20:17 PM By: [name] Hurst RN, BSN Entered By: [name] [name] on 02/13/2021 10:26:50 -------------------------------------------------------------------------------- Encounter Discharge Information Details Patient Name: Date of Service: [name] [name] 02/13/2021 9:30 A M Medical Record Number: [id number] Patient Account Number: [id number] Date of Birth/Sex: Treating RN: March 07, 1946 (75 y.o. [name] [name] Primary Care [name] [name]: [name] [name] Other Clinician: Referring Jerremy Maione: Treating Annette Liotta/Extender: [name] [name] in Treatment: 19 Encounter Discharge Information Items Discharge Condition: Stable Ambulatory Status: Ambulatory Discharge Destination: Home Transportation: Private Auto Accompanied By: daughter Schedule Follow-up Appointment: Yes Clinical Summary of Care: Patient Declined Electronic Signature(s) Signed: 02/13/2021 6:20:17 PM By: [name] Hurst RN, BSN Entered By: [name] [name] on 02/13/2021 10:27:27 -------------------------------------------------------------------------------- Lower Extremity Assessment Details Patient Name: Date of Service: [name] [name] 02/13/2021 9:30 A M Medical Record Number: [id number] Patient Account Number: [id number] Date of Birth/Sex: Treating RN: [birth year] (75 y.o. [name] [name] Primary Care Christine Schiefelbein: [name] [name] Other Clinician: Referring Robbin Loughmiller: Treating Maurice Fotheringham/Extender: [name] [name] in Treatment: 19 Edema Assessment Assessed: [name] [name]: No] Patrice Paradise: No] Edema: [Left: Ye] [Right: s] Calf Left: Right: Point of Measurement: 40 cm From Medial Instep 36.5 cm Ankle Left: Right: Point of Measurement: 10 cm From Medial Instep 25 cm Vascular Assessment Pulses: Dorsalis Pedis Palpable: [Left:Yes] Electronic Signature(s) Signed: 02/13/2021 6:20:17 PM By: [name] Hurst RN, BSN Entered By: [name] [name] on 02/13/2021 09:55:44 -------------------------------------------------------------------------------- Multi Wound Chart Details Patient Name: Date of Service: [name] [name] 02/13/2021 9:30 A M Medical Record Number: [id number] Patient Account Number: [id number] Date of Birth/Sex: Treating RN: 1945/11/21 (74 y.o. [name] [name] Primary Care Zarea Diesing: [name] [name] Other Clinician: Referring Kitti Mcclish: Treating Nadina Fomby/Extender: [name] [name] in Treatment: 19 Vital Signs Height(in): 74 Capillary Blood Glucose(mg/dl): 138 Weight(lbs): 238 Pulse(bpm): 58 Body Mass  Index(BMI): 31 Blood Pressure(mmHg): 113/69 Temperature(F): 98.4 Respiratory Rate(breaths/min): 18 Photos: [N/A:N/A] Left, Plantar Metatarsal head fifth N/A N/A Wound Location: Gradually Appeared N/A N/A Wounding Event: Diabetic Wound/Ulcer of the Lower N/A N/A Primary Etiology: Extremity Sleep Apnea, Arrhythmia, Congestive N/A N/A Comorbid History: Heart Failure, Coronary Artery Disease, Type II Diabetes, Osteoarthritis, Neuropathy 06/29/2020 N/A N/A Date Acquired: 73 N/A N/A Weeks of Treatment: Open N/A N/A Wound Status: 0.6x1x0.5 N/A N/A Measurements L x  W x D (cm) 0.471 N/A N/A A (cm) : rea 0.236 N/A N/A Volume (cm) : -563.40% N/A N/A % Reduction in A rea: -1023.80% N/A N/A % Reduction in Volume: 3 Starting Position 1 (o'clock): 5 Ending Position 1 (o'clock): 0.3 Maximum Distance 1 (cm): Yes N/A N/A Undermining: Grade 3 N/A N/A Classification: Medium N/A N/A Exudate A mount: Serosanguineous N/A N/A Exudate Type: red, brown N/A N/A Exudate Color: Well defined, not attached N/A N/A Wound Margin: Large (67-100%) N/A N/A Granulation A mount: Red N/A N/A Granulation Quality: Small (1-33%) N/A N/A Necrotic A mount: Fat Layer (Subcutaneous Tissue): Yes N/A N/A Exposed Structures: Tendon: Yes Fascia: No Muscle: No Joint: No Bone: No Small (1-33%) N/A N/A Epithelialization: Treatment Notes Electronic Signature(s) Signed: 02/13/2021 4:52:40 PM By: Linton Ham MD Signed: 02/13/2021 6:11:30 PM By: Deon Pilling Entered By: Linton Ham on 02/13/2021 10:17:39 -------------------------------------------------------------------------------- Multi-Disciplinary Care Plan Details Patient Name: Date of Service: [name] [name] 02/13/2021 9:30 A M Medical Record Number: [id number] Patient Account Number: [id number] Date of Birth/Sex: Treating RN: 04/19/1946 (75 y.o. [name] [name] Primary Care Darlette Dubow: [name] [name] Other  Clinician: Referring Danine Hor: Treating Shine Scrogham/Extender: [name] [name] in Treatment: 19 Multidisciplinary Care Plan reviewed with physician Active Inactive Wound/Skin Impairment Nursing Diagnoses: Impaired tissue integrity Goals: Patient/caregiver will verbalize understanding of skin care regimen Date Initiated: 09/27/2020 Target Resolution Date: 03/14/2021 Goal Status: Active Ulcer/skin breakdown will have a volume reduction of 30% by week 4 Date Initiated: 09/27/2020 Date Inactivated: 11/18/2020 Target Resolution Date: 11/15/2020 Goal Status: Met Interventions: Assess patient/caregiver ability to obtain necessary supplies Assess patient/caregiver ability to perform ulcer/skin care regimen upon admission and as needed Assess ulceration(s) every visit Provide education on ulcer and skin care Treatment Activities: Skin care regimen initiated : 09/27/2020 Topical wound management initiated : 09/27/2020 Notes: 10/25/20: Wound not yet at 30% volume reduction. Been on antibiotics, Xray obtained and Vascular Referral sent. Target date extended. Electronic Signature(s) Signed: 02/13/2021 6:20:17 PM By: [name] Hurst RN, BSN Entered By: [name] [name] on 02/13/2021 09:59:25 -------------------------------------------------------------------------------- Pain Assessment Details Patient Name: Date of Service: [name] [name] 02/13/2021 9:30 A M Medical Record Number: [id number] Patient Account Number: [id number] Date of Birth/Sex: Treating RN: 22-Nov-1945 (75 y.o. [name] [name] Primary Care Rhea Thrun: [name] [name] Other Clinician: Referring Loel Betancur: Treating Kiera Hussey/Extender: [name] [name] in Treatment: 19 Active Problems Location of Pain Severity and Description of Pain Patient Has Paino No Site Locations Pain Management and Medication Current Pain Management: Electronic Signature(s) Signed: 02/13/2021 6:11:30 PM By: Deon Pilling Signed: 02/19/2021 9:12:52 AM By: Sandre Kitty Entered By: Sandre Kitty on 02/13/2021 09:43:15 -------------------------------------------------------------------------------- Patient/Caregiver Education Details Patient Name: Date of Service: [name] [name] 8/18/2022andnbsp9:30 A M Medical Record Number: [id number] Patient Account Number: [id number] Date of Birth/Gender: Treating RN: 11-Nov-1945 (75 y.o. [name] [name] Primary Care Physician: [name] [name] Other Clinician: Referring Physician: Treating Physician/Extender: [name] [name] in Treatment: 19 Education Assessment Education Provided To: Patient Education Topics Provided Wound/Skin Impairment: Methods: Explain/Verbal Responses: State content correctly [employer]) Signed: 02/13/2021 6:20:17 PM By: [name] Hurst RN, BSN Entered By: [name] [name] on 02/13/2021 09:59:36 -------------------------------------------------------------------------------- Wound Assessment Details Patient Name: Date of Service: [name] [name] 02/13/2021 9:30 A M Medical Record Number: [id number] Patient Account Number: [id number] Date of Birth/Sex: Treating RN: 09/22/1945 (74 y.o. [name] [name] Primary Care Mikeisha Lemonds: [name] [name] Other Clinician: Referring Heraclio Seidman: Treating Sydna Brodowski/Extender: [name] [name] in Treatment: 19 Wound Status Wound  Number: 1 Primary Diabetic Wound/Ulcer of the Lower Extremity Etiology: Wound Location: Left, Plantar Metatarsal head fifth Wound Open Wounding Event: Gradually Appeared Status: Date Acquired: 06/29/2020 Comorbid Sleep Apnea, Arrhythmia, Congestive Heart Failure, Coronary Weeks Of Treatment: 19 History: Artery Disease, Type II Diabetes, Osteoarthritis, Neuropathy Clustered Wound: No Photos Wound Measurements Length: (cm) 0.6 Width: (cm) 1 Depth: (cm) 0.5 Area: (cm) 0.471 Volume: (cm) 0.236 % Reduction in Area:  -563.4% % Reduction in Volume: -1023.8% Epithelialization: Small (1-33%) Tunneling: No Undermining: Yes Starting Position (o'clock): 3 Ending Position (o'clock): 5 Maximum Distance: (cm) 0.3 Wound Description Classification: Grade 3 Wound Margin: Well defined, not attached Exudate Amount: Medium Exudate Type: Serosanguineous Exudate Color: red, brown Foul Odor After Cleansing: No Slough/Fibrino Yes Wound Bed Granulation Amount: Large (67-100%) Exposed Structure Granulation Quality: Red Fascia Exposed: No Necrotic Amount: Small (1-33%) Fat Layer (Subcutaneous Tissue) Exposed: Yes Necrotic Quality: Adherent Slough Tendon Exposed: Yes Muscle Exposed: No Joint Exposed: No Bone Exposed: No Treatment Notes Wound #1 (Metatarsal head fifth) Wound Laterality: Plantar, Left Cleanser Normal Saline Discharge Instruction: Cleanse the wound with Normal Saline prior to applying a clean dressing using gauze sponges, not tissue or cotton balls. Soap and Water Discharge Instruction: May shower and wash wound with dial antibacterial soap and water prior to dressing change. Wound Cleanser Discharge Instruction: Cleanse the wound with wound cleanser prior to applying a clean dressing using gauze sponges, not tissue or cotton balls. Peri-Wound Care Topical Primary Dressing Promogran Prisma Matrix, 4.34 (sq in) (silver collagen) Discharge Instruction: Moisten collagen with saline or hydrogel Secondary Dressing Woven Gauze Sponges 2x2 in Discharge Instruction: Apply over primary dressing as directed. Bordered Gauze, 2x3.75 in Discharge Instruction: Apply over primary dressing as directed. Secured With [employer], 4.5x3.1 (in/yd) Discharge Instruction: Secure with Kerlix as directed. 60M Medipore H Soft Cloth Surgical T ape, 2x2 (in/yd) Discharge Instruction: Secure dressing with tape as directed. Compression Wrap Compression Stockings Add-Ons Electronic Signature(s) Signed:  02/13/2021 6:11:30 PM By: Deon Pilling Signed: 02/13/2021 6:20:17 PM By: [name] Hurst RN, BSN Entered By: [name] [name] on 02/13/2021 09:55:13 -------------------------------------------------------------------------------- [place] Details Patient Name: Date of Service: [name] [name] 02/13/2021 9:30 A M Medical Record Number: [id number] Patient Account Number: [id number] Date of Birth/Sex: Treating RN: January 22, 1946 (74 y.o. [name] [name] Primary Care Jadarion Halbig: [name] [name] Other Clinician: Referring Arielys Wandersee: Treating Alyas Creary/Extender: [name] [name] in Treatment: 19 Vital Signs Time Taken: 09:42 Temperature (F): 98.4 Height (in): 74 Pulse (bpm): 67 Weight (lbs): 238 Respiratory Rate (breaths/min): 18 Body Mass Index (BMI): 30.6 Blood Pressure (mmHg): 113/69 Capillary Blood Glucose (mg/dl): 138 Reference Range: 80 - 120 mg / dl Electronic Signature(s) Signed: 02/19/2021 9:12:52 AM By: Sandre Kitty Entered By: Sandre Kitty on 02/13/2021 09:43:05

## 2021-02-27 ENCOUNTER — Other Ambulatory Visit: Payer: Self-pay

## 2021-02-27 ENCOUNTER — Encounter (HOSPITAL_BASED_OUTPATIENT_CLINIC_OR_DEPARTMENT_OTHER): Payer: Medicare Other | Attending: Internal Medicine | Admitting: Internal Medicine

## 2021-02-27 DIAGNOSIS — I10 Essential (primary) hypertension: Secondary | ICD-10-CM | POA: Insufficient documentation

## 2021-02-27 DIAGNOSIS — E11621 Type 2 diabetes mellitus with foot ulcer: Secondary | ICD-10-CM | POA: Diagnosis not present

## 2021-02-27 DIAGNOSIS — L97522 Non-pressure chronic ulcer of other part of left foot with fat layer exposed: Secondary | ICD-10-CM | POA: Diagnosis not present

## 2021-02-27 DIAGNOSIS — M86272 Subacute osteomyelitis, left ankle and foot: Secondary | ICD-10-CM | POA: Insufficient documentation

## 2021-02-27 DIAGNOSIS — L97528 Non-pressure chronic ulcer of other part of left foot with other specified severity: Secondary | ICD-10-CM | POA: Insufficient documentation

## 2021-02-27 DIAGNOSIS — E1142 Type 2 diabetes mellitus with diabetic polyneuropathy: Secondary | ICD-10-CM | POA: Diagnosis not present

## 2021-02-27 DIAGNOSIS — E1151 Type 2 diabetes mellitus with diabetic peripheral angiopathy without gangrene: Secondary | ICD-10-CM | POA: Diagnosis not present

## 2021-02-27 DIAGNOSIS — E1169 Type 2 diabetes mellitus with other specified complication: Secondary | ICD-10-CM | POA: Insufficient documentation

## 2021-02-27 NOTE — Progress Notes (Signed)
[name], [name] ([id number]) Visit Report for 02/27/2021 Debridement Details Patient Name: Date of Service: [name] [name], [name] [name] 02/27/2021 9:30 A M Medical Record Number: [id number] Patient Account Number: [id number] Date of Birth/Sex: Treating RN: Jul 28, 1945 (75 y.o. [name] [name] Primary Care Provider: Consuello Masse Other Clinician: Referring Provider: Treating Provider/Extender: [name] [name] in Treatment: 21 Debridement Performed for Assessment: Wound #1 Left,Plantar Metatarsal head fifth Performed By: Physician Ricard Dillon., MD Debridement Type: Debridement Severity of Tissue Pre Debridement: Fat layer exposed Level of Consciousness (Pre-procedure): Awake and Alert Pre-procedure Verification/Time Out Yes - 10:10 Taken: Start Time: 10:11 Pain Control: Lidocaine 4% T opical Solution T Area Debrided (L x W): otal 0.5 (cm) x 0.7 (cm) = 0.35 (cm) Tissue and other material debrided: Viable, Non-Viable, Callus, Subcutaneous, Skin: Dermis , Skin: Epidermis, Fibrin/Exudate Level: Skin/Subcutaneous Tissue Debridement Description: Excisional Instrument: Curette Bleeding: Moderate Hemostasis Achieved: Silver Nitrate End Time: 10:13 Procedural Pain: 0 Post Procedural Pain: 0 Response to Treatment: Procedure was tolerated well Level of Consciousness (Post- Awake and Alert procedure): Post Debridement Measurements of Total Wound Length: (cm) 0.8 Width: (cm) 0.9 Depth: (cm) 0.5 Volume: (cm) 0.283 Character of Wound/Ulcer Post Debridement: Improved Severity of Tissue Post Debridement: Fat layer exposed Post Procedure Diagnosis Same as Pre-procedure Electronic Signature(s) Signed: 02/27/2021 5:31:22 PM By: Linton Ham MD Signed: 02/27/2021 5:36:38 PM By: Deon Pilling Entered By: Linton Ham on 02/27/2021 11:02:32 -------------------------------------------------------------------------------- HPI Details Patient Name: Date of Service: [name] [name] 02/27/2021 9:30 A M Medical Record Number: [id number] Patient Account Number: [id number] Date of Birth/Sex: Treating RN: 28-Aug-1945 (75 y.o. [name] [name] Primary Care Provider: Consuello Masse Other Clinician: Referring Provider: Treating Provider/Extender: [name] [name] in Treatment: 21 History of Present Illness HPI Description: ADMISSION 09/27/2020 This is a 75 year old man who lives in [place]. He apparently has had callus over the plantar fifth metatarsal head in the past for which she is followed by podiatry in [place]. They shaved down the callus and this was done in January. He states that he went to [place] in January and became aware of when he was there of an open wound in this area. He followed up with podiatry and has been soaking this twice a day with Epson salts and using Silvadene. Apparently one of the podiatrist told him he may need surgery because of bone protrusion. Past medical history includes an ischemic cardiomyopathy, type 2 diabetes with peripheral neuropathy, BPH, PVD, orthostatic hypotension, atrial fibrillation, hyperlipidemia and hypertension Arterial studies in 2018 showed a noncompressible ABI on the left. It was again noncompressible today at 1.7 although his pulses are easily palpable 4/8; patient I admitted to the clinic last week. Wound on the plantar left fifth metatarsal head which has been refractory. He also has a bit of subluxation of the bone in the head. He is a type II diabetic with peripheral neuropathy. We used silver collagen after debridement He arrives back in clinic today. He has erythema spreading into the lateral part of the fifth metatarsal head. I removed some undermining tissue from around the wound and clearly there is a connection between the wound and the lateral part of the fifth met head. A culture was done. Area of erythema on the lateral part of the foot was marked. His wife says that has been there  since Wednesday 4/15; the patient arrived last week with erythema spreading in the lateral part of the fifth metatarsal head. There was a wound connected  with the plantar fifth met head original wound. I gave him empiric Augmentin. Surprisingly the culture I did was negative. We have been using silver alginate. The wound has been open since January. He arrives in clinic today with the erythema much better 4/22; patient presents today for 1 week follow-up of his fifth metatarsal head wound. Daughter is present with the patient. She states that the wound has improved significantly since 2 weeks ago. There is no longer redness to the foot and the actual wound bed is smaller. Patient overall feels well and has no complaints today. 4/29; patient presents for 1 week follow-up of his fifth metatarsal head wound. Daughter is present. Patient has been using silver alginate every other day to the wound site. He has tried to relieve pressure with a surgical shoe. He denies signs of infections. 5/6; fifth metatarsal head wound. He has been using silver alginate changed to Santyl last week which he managed to obtain for $21 with a coupon we gave him. He also has an appointment with vein and vascular. I think Dr. Heber [place] referred him. He has noncompressible ABIs. The patient also had an x-ray and that was negative 5/13; the patient went to see [name]. Areas on the left fifth metatarsal head plantar and lateral. [name] noted that his ABI in the right was 0.99 but monophasic waveforms with a TBI at 0.32. He wanted to go ahead with an angiogram which is booked for Thursday. I talked to the patient about this and advised him to go forward with this. 5/23; patient presents for 1 week follow-up. He has been using silver alginate to the wound bed. He has no complaints today. Denies signs of infection. He had an arteriogram done on 5/19. 5/31; angiogram showed patent common femoral, SFA, profunda, above-knee  popliteal artery. 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 He does have inline flow down the left lower extremity through the peroneal that is widely patent. The peroneal gave off collaterals distally to the anterior tibial that feels retrograde at the ankle. An attempted recanalization of the left anterior tibial artery was made but was not successful. The plan as outlined by [name] is that he will continue getting aggressive wound care and he would consider a retrograde left anterior tibial intervention if there is no improvement. Wound itself does not look as though it is making any improvement. There is still tunneling superiorly precariously close to bone 6/3; back for the obligatory first total contact cast change. The wound clearly has exposed bone. He is a type II diabetic. He has been revascularized. Plain x- ray did not show osteomyelitis he may require an MRI. After his revascularization I elected to put him into a cast to make sure that all the pressure was coming off this area 6/7; patient back for his routine visit. The wound is not doing well. The patient had a lot of discomfort since he was last here 4 days ago. No systemic illness. He will not go back in the total contact cast today. 6/14; culture I did last time showed Enterobacter. At the time there was erythema and swelling I put him on Augmentin and Bactrim empirically. The Enterobacter was sensitive to the Bactrim DS. He sees [name] tomorrow. I am looking for her recommendation about antibiotic choices and route of administration. He sees [name] of vascular surgery on 6/21. I have communicated with [name] I  would like to see whether or not further revascularization will be entertained and whether [name] feels that he could survive a foot conserving surgery if that is the route the patient wishes to go. After these next  2 consultations we may be able to discuss the route the patient wants to take either a surgical 1 or perhaps 1 that involves a prolonged course of antibiotics and hyperbaric oxygen. I touch bases with the patient and his daughter on all of this. MRI suggested osteomyelitis in the distal 2 cm of the fifth metatarsal 6/24; the patient was kindly seen by [name] of infectious disease by telehealth visit. He has established a PICC line and is on a Carbapenem for 4 weeks although I am not really sure which 1. Initial culture was Enterobacter I believe this was a swab. He is also followed up with [name]. He again is considering an anterograde anterior tibial attempted revascularization. The patient is to call if they agree to move forward with the anterograde attempt. He started an IV antibiotics already. He arrives in clinic today with again the wound deteriorating. There is exposed bone widely which is quite a bit worse than last week. I did obtain a piece of this for culture but more bone debridement is likely going to need to be done. I had an extensive discussion with the patient and his daughter. The problem is the ray amputation success depends largely on blood flow. [name] did not sound optimistic about this. However I would definitely go ahead with the retrograde attempt by [name] even though there are potential risks. I explained this to the patient. If we are going to have any attempt to salvage this man's foot we are going to need blood flow and I would go ahead with this. IV antibiotics in the meantime and consideration of hyperbaric oxygen all of these things I discussed. The option would be a below-knee amputation which the patient does not seem ready for his daughter is certainly not ready for. But we also talked about this 6/30; bone culture I did last week showed the same Enterobacter cloacae I as previous. Also rare Staph epidermidis. I am not really sure of the significance  of this which may be a contamination. He has a anterior grade access attempt by [name] of vein and vascular on 7/7. After we see the result of this I will go over his options which are continued antibiotics and hyperbarics versus an attempt at a ray amputation if [name] is successful in establishing more blood flow here. 7/12 the patient saw [name] yesterday who extended his IV antibiotics. He also had an attempt at left anterior tibial artery retrograde revascularization by [name] however this was not successful. We are therefore looking at the same blood flow that we had previously. I had an extensive discussion with him today. I think he has a good chance that healing ray amputation. The other option is to continue his IV antibiotics and initiate hyperbaric oxygen therapy. The patient seems to be opting for the latter 7/19; continues on IV antibiotics per the patient until August 3. He is going to wait to [facility] sometime after that. He is approved for HBO but I do not think we will be able to get that going until sometime in the August 20 21st range per he has been using silver alginate 7/26; continues on IV antibiotics as directed by [name]. This will apparently end on  August 3 and then the patient thinks he will go on oral antibiotics after that. The patient is leaving on vacation from 8/10 through 8/17. There is no point in putting him through hyperbarics at this point. His wound has improved he has been using silver alginate at 1 point about 33% of this was exposed bone all of this is covered by granulation now. 8/18; patient was away on vacation therefore we did not initiate HBO. He has completed IV antibiotics as well as 2 weeks of oral Septra as directed by Dr. Megan Salon. He is follows up tomorrow and expects to be discharged. He has been using silver alginate. The wound measures smaller 9/1 small wound on the left lateral fifth met head. Still some undermining.  Somewhat friable mucosa but no overt infection this does not probe to bone there is no surrounding erythema. We have been using silver collagen. He saw Dr. Megan Salon in follow-up for his underlying osteomyelitis.Marland Kitchen He has completed 6 weeks of IV ertapenem and then 2 weeks of oral trimethoprim/sulfamethoxazole. He is now off antibiotics Electronic Signature(s) Signed: 02/27/2021 5:31:22 PM By: Linton Ham MD Entered By: Linton Ham on 02/27/2021 11:05:29 -------------------------------------------------------------------------------- Physical Exam Details Patient Name: Date of Service: [name] [name] 02/27/2021 9:30 A M Medical Record Number: [id number] Patient Account Number: [id number] Date of Birth/Sex: Treating RN: [birth year] (75 y.o. [name] [name] Primary Care Provider: Consuello Masse Other Clinician: Referring Provider: Treating Provider/Extender: [name] [name] in Treatment: 21 Constitutional Sitting or standing Blood Pressure is within target range for patient.. Pulse regular and within target range for patient.Marland Kitchen Respirations regular, non-labored and within target range.. Temperature is normal and within the target range for the patient.Marland Kitchen Appears in no distress. Notes Wound exam; left lateral fifth metatarsal head. Small wound but with undermining distally. I used a #3 curette to remove the overlying skin and subcutaneous tissue postdebridement there is no undermining granulation somewhat friable but otherwise healthy. No evidence of surrounding infection no palpable bone Electronic Signature(s) Signed: 02/27/2021 5:31:22 PM By: Linton Ham MD Entered By: Linton Ham on 02/27/2021 11:06:15 -------------------------------------------------------------------------------- Physician Orders Details Patient Name: Date of Service: [name] [name] 02/27/2021 9:30 A M Medical Record Number: [id number] Patient Account Number: [id number] Date of Birth/Sex:  Treating RN: 1946-05-05 (74 y.o. [name] [name] Primary Care Provider: Consuello Masse Other Clinician: Referring Provider: Treating Provider/Extender: [name] [name] in Treatment: 21 Verbal / Phone Orders: No Diagnosis Coding ICD-10 Coding Code Description E11.621 Type 2 diabetes mellitus with foot ulcer E11.51 Type 2 diabetes mellitus with diabetic peripheral angiopathy without gangrene L97.528 Non-pressure chronic ulcer of other part of left foot with other specified severity M86.272 Subacute osteomyelitis, left ankle and foot E11.42 Type 2 diabetes mellitus with diabetic polyneuropathy Follow-up Appointments ppointment in 1 week. - Dr. Dellia Nims Return A Bathing/ Shower/ Hygiene May shower and wash wound with soap and water. - on days changing dressing. Do not soak foot. Edema Control - Lymphedema / SCD / Other Elevate legs to the level of the heart or above for 30 minutes daily and/or when sitting, a frequency of: - throughout the day Avoid standing for long periods of time. Exercise regularly Off-Loading Open toe surgical shoe to: - left foot Additional Orders / Instructions Follow Nutritious Diet Wound Treatment Wound #1 - Metatarsal head fifth Wound Laterality: Plantar, Left Cleanser: Normal Saline (Generic) Every Other Day/30 Days Discharge Instructions: Cleanse the wound with Normal Saline prior to applying a clean dressing using gauze  sponges, not tissue or cotton balls. Cleanser: Soap and Water Every Other Day/30 Days Discharge Instructions: May shower and wash wound with dial antibacterial soap and water prior to dressing change. Cleanser: Wound Cleanser (Generic) Every Other Day/30 Days Discharge Instructions: Cleanse the wound with wound cleanser prior to applying a clean dressing using gauze sponges, not tissue or cotton balls. Prim Dressing: IODOFLEX 0.9% Cadexomer Iodine Pad 4x6 cm Every Other Day/30 Days ary Discharge Instructions: Apply to  wound bed and undermining as instructed Secondary Dressing: Woven Gauze Sponges 2x2 in (Generic) Every Other Day/30 Days Discharge Instructions: Apply over primary dressing as directed. Secondary Dressing: Bordered Gauze, 2x3.75 in (Generic) Every Other Day/30 Days Discharge Instructions: Apply over primary dressing as directed. Secured With: [employer], 4.5x3.1 (in/yd) Every Other Day/30 Days Discharge Instructions: Secure with Kerlix as directed. Secured With: 11M Medipore H Soft Cloth Surgical Tape, 2x2 (in/yd) (Generic) Every Other Day/30 Days Discharge Instructions: Secure dressing with tape as directed. Electronic Signature(s) Signed: 02/27/2021 5:31:22 PM By: Linton Ham MD Signed: 02/27/2021 5:36:38 PM By: Deon Pilling Entered By: Deon Pilling on 02/27/2021 10:14:35 -------------------------------------------------------------------------------- Problem List Details Patient Name: Date of Service: [name] [name] 02/27/2021 9:30 A M Medical Record Number: [id number] Patient Account Number: [id number] Date of Birth/Sex: Treating RN: 1946-01-04 (74 y.o. [name] [name] Primary Care Provider: Consuello Masse Other Clinician: Referring Provider: Treating Provider/Extender: [name] [name] in Treatment: 21 Active Problems ICD-10 Encounter Code Description Active Date MDM Diagnosis E11.621 Type 2 diabetes mellitus with foot ulcer 09/27/2020 No Yes E11.51 Type 2 diabetes mellitus with diabetic peripheral angiopathy without gangrene 11/11/2020 No Yes L97.528 Non-pressure chronic ulcer of other part of left foot with other specified 09/27/2020 No Yes severity M86.272 Subacute osteomyelitis, left ankle and foot 12/10/2020 No Yes E11.42 Type 2 diabetes mellitus with diabetic polyneuropathy 09/27/2020 No Yes Inactive Problems ICD-10 Code Description Active Date Inactive Date L03.116 Cellulitis of left lower limb 10/04/2020 10/04/2020 Resolved Problems Electronic  Signature(s) Signed: 02/27/2021 5:31:22 PM By: Linton Ham MD Entered By: Linton Ham on 02/27/2021 11:01:41 -------------------------------------------------------------------------------- Progress Note Details Patient Name: Date of Service: [name] [name] 02/27/2021 9:30 A M Medical Record Number: [id number] Patient Account Number: [id number] Date of Birth/Sex: Treating RN: 10/14/1945 (75 y.o. [name] [name] Primary Care Provider: Consuello Masse Other Clinician: Referring Provider: Treating Provider/Extender: [name] [name] in Treatment: 21 Subjective History of Present Illness (HPI) ADMISSION 09/27/2020 This is a 75 year old man who lives in [place]. He apparently has had callus over the plantar fifth metatarsal head in the past for which she is followed by podiatry in [place]. They shaved down the callus and this was done in January. He states that he went to [place] in January and became aware of when he was there of an open wound in this area. He followed up with podiatry and has been soaking this twice a day with Epson salts and using Silvadene. Apparently one of the podiatrist told him he may need surgery because of bone protrusion. Past medical history includes an ischemic cardiomyopathy, type 2 diabetes with peripheral neuropathy, BPH, PVD, orthostatic hypotension, atrial fibrillation, hyperlipidemia and hypertension Arterial studies in 2018 showed a noncompressible ABI on the left. It was again noncompressible today at 1.7 although his pulses are easily palpable 4/8; patient I admitted to the clinic last week. Wound on the plantar left fifth metatarsal head which has been refractory. He also has a bit of subluxation of the bone in  the head. He is a type II diabetic with peripheral neuropathy. We used silver collagen after debridement He arrives back in clinic today. He has erythema spreading into the lateral part of the fifth metatarsal head. I  removed some undermining tissue from around the wound and clearly there is a connection between the wound and the lateral part of the fifth met head. A culture was done. Area of erythema on the lateral part of the foot was marked. His wife says that has been there since Wednesday 4/15; the patient arrived last week with erythema spreading in the lateral part of the fifth metatarsal head. There was a wound connected with the plantar fifth met head original wound. I gave him empiric Augmentin. Surprisingly the culture I did was negative. We have been using silver alginate. The wound has been open since January. He arrives in clinic today with the erythema much better 4/22; patient presents today for 1 week follow-up of his fifth metatarsal head wound. Daughter is present with the patient. She states that the wound has improved significantly since 2 weeks ago. There is no longer redness to the foot and the actual wound bed is smaller. Patient overall feels well and has no complaints today. 4/29; patient presents for 1 week follow-up of his fifth metatarsal head wound. Daughter is present. Patient has been using silver alginate every other day to the wound site. He has tried to relieve pressure with a surgical shoe. He denies signs of infections. 5/6; fifth metatarsal head wound. He has been using silver alginate changed to Santyl last week which he managed to obtain for $21 with a coupon we gave him. He also has an appointment with vein and vascular. I think Dr. Heber [place] referred him. He has noncompressible ABIs. The patient also had an x-ray and that was negative 5/13; the patient went to see [name]. Areas on the left fifth metatarsal head plantar and lateral. [name] noted that his ABI in the right was 0.99 but monophasic waveforms with a TBI at 0.32. He wanted to go ahead with an angiogram which is booked for Thursday. I talked to the patient about this and advised him to go forward with  this. 5/23; patient presents for 1 week follow-up. He has been using silver alginate to the wound bed. He has no complaints today. Denies signs of infection. He had an arteriogram done on 5/19. 5/31; angiogram showed patent common femoral, SFA, profunda, above-knee popliteal artery. 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 He does have inline flow down the left lower extremity through the peroneal that is widely patent. The peroneal gave off collaterals distally to the anterior tibial that feels retrograde at the ankle. An attempted recanalization of the left anterior tibial artery was made but was not successful. The plan as outlined by [name] is that he will continue getting aggressive wound care and he would consider a retrograde left anterior tibial intervention if there is no improvement. Wound itself does not look as though it is making any improvement. There is still tunneling superiorly precariously close to bone 6/3; back for the obligatory first total contact cast change. The wound clearly has exposed bone. He is a type II diabetic. He has been revascularized. Plain x- ray did not show osteomyelitis he may require an MRI. After his revascularization I elected to put him into a cast to make sure that all the pressure was  coming off this area 6/7; patient back for his routine visit. The wound is not doing well. The patient had a lot of discomfort since he was last here 4 days ago. No systemic illness. He will not go back in the total contact cast today. 6/14; culture I did last time showed Enterobacter. At the time there was erythema and swelling I put him on Augmentin and Bactrim empirically. The Enterobacter was sensitive to the Bactrim DS. He sees [name] tomorrow. I am looking for her recommendation about antibiotic choices and route of administration. He sees [name] of vascular surgery on  6/21. I have communicated with [name] I would like to see whether or not further revascularization will be entertained and whether [name] feels that he could survive a foot conserving surgery if that is the route the patient wishes to go. After these next 2 consultations we may be able to discuss the route the patient wants to take either a surgical 1 or perhaps 1 that involves a prolonged course of antibiotics and hyperbaric oxygen. I touch bases with the patient and his daughter on all of this. MRI suggested osteomyelitis in the distal 2 cm of the fifth metatarsal 6/24; the patient was kindly seen by [name] of infectious disease by telehealth visit. He has established a PICC line and is on a Carbapenem for 4 weeks although I am not really sure which 1. Initial culture was Enterobacter I believe this was a swab. He is also followed up with [name]. He again is considering an anterograde anterior tibial attempted revascularization. The patient is to call if they agree to move forward with the anterograde attempt. He started an IV antibiotics already. He arrives in clinic today with again the wound deteriorating. There is exposed bone widely which is quite a bit worse than last week. I did obtain a piece of this for culture but more bone debridement is likely going to need to be done. I had an extensive discussion with the patient and his daughter. The problem is the ray amputation success depends largely on blood flow. [name] did not sound optimistic about this. However I would definitely go ahead with the retrograde attempt by [name] even though there are potential risks. I explained this to the patient. If we are going to have any attempt to salvage this man's foot we are going to need blood flow and I would go ahead with this. IV antibiotics in the meantime and consideration of hyperbaric oxygen all of these things I discussed. The option would be a below-knee amputation which the  patient does not seem ready for his daughter is certainly not ready for. But we also talked about this 6/30; bone culture I did last week showed the same Enterobacter cloacae I as previous. Also rare Staph epidermidis. I am not really sure of the significance of this which may be a contamination. He has a anterior grade access attempt by [name] of vein and vascular on 7/7. After we see the result of this I will go over his options which are continued antibiotics and hyperbarics versus an attempt at a ray amputation if [name] is successful in establishing more blood flow here. 7/12 the patient saw [name] yesterday who extended his IV antibiotics. He also had an attempt at left anterior tibial artery retrograde revascularization by [name] however this was not successful. We are therefore looking at the same blood flow that we had previously. I  had an extensive discussion with him today. I think he has a good chance that healing ray amputation. The other option is to continue his IV antibiotics and initiate hyperbaric oxygen therapy. The patient seems to be opting for the latter 7/19; continues on IV antibiotics per the patient until August 3. He is going to wait to [facility] sometime after that. He is approved for HBO but I do not think we will be able to get that going until sometime in the August 20 21st range per he has been using silver alginate 7/26; continues on IV antibiotics as directed by [name]. This will apparently end on August 3 and then the patient thinks he will go on oral antibiotics after that. The patient is leaving on vacation from 8/10 through 8/17. There is no point in putting him through hyperbarics at this point. His wound has improved he has been using silver alginate at 1 point about 33% of this was exposed bone all of this is covered by granulation now. 8/18; patient was away on vacation therefore we did not initiate HBO. He has completed IV  antibiotics as well as 2 weeks of oral Septra as directed by Dr. Megan Salon. He is follows up tomorrow and expects to be discharged. He has been using silver alginate. The wound measures smaller 9/1 small wound on the left lateral fifth met head. Still some undermining. Somewhat friable mucosa but no overt infection this does not probe to bone there is no surrounding erythema. We have been using silver collagen. He saw Dr. Megan Salon in follow-up for his underlying osteomyelitis.Marland Kitchen He has completed 6 weeks of IV ertapenem and then 2 weeks of oral trimethoprim/sulfamethoxazole. He is now off antibiotics Objective Constitutional Sitting or standing Blood Pressure is within target range for patient.. Pulse regular and within target range for patient.Marland Kitchen Respirations regular, non-labored and within target range.. Temperature is normal and within the target range for the patient.Marland Kitchen Appears in no distress. Vitals Time Taken: 9:35 AM, Height: 74 in, Weight: 238 lbs, BMI: 30.6, Temperature: 97.8 F, Pulse: 59 bpm, Respiratory Rate: 18 breaths/min, Blood Pressure: 110/67 mmHg, Capillary Blood Glucose: 111 mg/dl. General Notes: Wound exam; left lateral fifth metatarsal head. Small wound but with undermining distally. I used a #3 curette to remove the overlying skin and subcutaneous tissue postdebridement there is no undermining granulation somewhat friable but otherwise healthy. No evidence of surrounding infection no palpable bone Integumentary (Hair, Skin) Wound #1 status is Open. Original cause of wound was Gradually Appeared. The date acquired was: 06/29/2020. The wound has been in treatment 21 weeks. The wound is located on the Left,Plantar Metatarsal head fifth. The wound measures 0.5cm length x 0.7cm width x 0.5cm depth; 0.275cm^2 area and 0.137cm^3 volume. There is tendon and Fat Layer (Subcutaneous Tissue) exposed. There is undermining starting at 6:00 and ending at 8:00 with a maximum distance of 0.6cm.  There is a medium amount of serosanguineous drainage noted. The wound margin is well defined and not attached to the wound base. There is large (67- 100%) red granulation within the wound bed. There is a small (1-33%) amount of necrotic tissue within the wound bed including Adherent Slough. Assessment Active Problems ICD-10 Type 2 diabetes mellitus with foot ulcer Type 2 diabetes mellitus with diabetic peripheral angiopathy without gangrene Non-pressure chronic ulcer of other part of left foot with other specified severity Subacute osteomyelitis, left ankle and foot Type 2 diabetes mellitus with diabetic polyneuropathy Procedures Wound #1 Pre-procedure diagnosis of Wound #  1 is a Diabetic Wound/Ulcer of the Lower Extremity located on the Left,Plantar Metatarsal head fifth .Severity of Tissue Pre Debridement is: Fat layer exposed. There was a Excisional Skin/Subcutaneous Tissue Debridement with a total area of 0.35 sq cm performed by Ricard Dillon., MD. With the following instrument(s): Curette to remove Viable and Non-Viable tissue/material. Material removed includes Callus, Subcutaneous Tissue, Skin: Dermis, Skin: Epidermis, and Fibrin/Exudate after achieving pain control using Lidocaine 4% T opical Solution. A time out was conducted at 10:10, prior to the start of the procedure. A Moderate amount of bleeding was controlled with Silver Nitrate. The procedure was tolerated well with a pain level of 0 throughout and a pain level of 0 following the procedure. Post Debridement Measurements: 0.8cm length x 0.9cm width x 0.5cm depth; 0.283cm^3 volume. Character of Wound/Ulcer Post Debridement is improved. Severity of Tissue Post Debridement is: Fat layer exposed. Post procedure Diagnosis Wound #1: Same as Pre-Procedure Plan Follow-up Appointments: Return Appointment in 1 week. - Dr. Dellia Nims Bathing/ Shower/ Hygiene: May shower and wash wound with soap and water. - on days changing dressing. Do  not soak foot. Edema Control - Lymphedema / SCD / Other: Elevate legs to the level of the heart or above for 30 minutes daily and/or when sitting, a frequency of: - throughout the day Avoid standing for long periods of time. Exercise regularly Off-Loading: Open toe surgical shoe to: - left foot Additional Orders / Instructions: Follow Nutritious Diet WOUND #1: - Metatarsal head fifth Wound Laterality: Plantar, Left Cleanser: Normal Saline (Generic) Every Other Day/30 Days Discharge Instructions: Cleanse the wound with Normal Saline prior to applying a clean dressing using gauze sponges, not tissue or cotton balls. Cleanser: Soap and Water Every Other Day/30 Days Discharge Instructions: May shower and wash wound with dial antibacterial soap and water prior to dressing change. Cleanser: Wound Cleanser (Generic) Every Other Day/30 Days Discharge Instructions: Cleanse the wound with wound cleanser prior to applying a clean dressing using gauze sponges, not tissue or cotton balls. Prim Dressing: IODOFLEX 0.9% Cadexomer Iodine Pad 4x6 cm Every Other Day/30 Days ary Discharge Instructions: Apply to wound bed and undermining as instructed Secondary Dressing: Woven Gauze Sponges 2x2 in (Generic) Every Other Day/30 Days Discharge Instructions: Apply over primary dressing as directed. Secondary Dressing: Bordered Gauze, 2x3.75 in (Generic) Every Other Day/30 Days Discharge Instructions: Apply over primary dressing as directed. Secured With: [employer], 4.5x3.1 (in/yd) Every Other Day/30 Days Discharge Instructions: Secure with Kerlix as directed. Secured With: 72M Medipore H Soft Cloth Surgical T ape, 2x2 (in/yd) (Generic) Every Other Day/30 Days Discharge Instructions: Secure dressing with tape as directed. 1. I change the primary dressing to Iodoflex. Antibacterial absorptive Electronic Signature(s) Signed: 02/27/2021 5:31:22 PM By: Linton Ham MD Entered By: Linton Ham on  02/27/2021 11:06:50 -------------------------------------------------------------------------------- SuperBill Details Patient Name: Date of Service: [name] [name] 02/27/2021 Medical Record Number: [id number] Patient Account Number: [id number] Date of Birth/Sex: Treating RN: [birth year] (74 y.o. [name], Meta.Reding Primary Care Provider: Consuello Masse Other Clinician: Referring Provider: Treating Provider/Extender: [name] [name] in Treatment: 21 Diagnosis Coding ICD-10 Codes Code Description E11.621 Type 2 diabetes mellitus with foot ulcer E11.51 Type 2 diabetes mellitus with diabetic peripheral angiopathy without gangrene L97.528 Non-pressure chronic ulcer of other part of left foot with other specified severity M86.272 Subacute osteomyelitis, left ankle and foot E11.42 Type 2 diabetes mellitus with diabetic polyneuropathy Facility Procedures CPT4 Code: [id number] Description: 10258 - DEB SUBQ TISSUE 20 SQ CM/< ICD-10  Diagnosis Description L97.528 Non-pressure chronic ulcer of other part of left foot with other specified seve Modifier: rity Quantity: 1 Physician Procedures : CPT4 Code Description Modifier [id number] 11042 - WC PHYS SUBQ TISS 20 SQ CM ICD-10 Diagnosis Description L97.528 Non-pressure chronic ulcer of other part of left foot with other specified severity Quantity: 1 Electronic Signature(s) Signed: 02/27/2021 5:31:22 PM By: Linton Ham MD Entered By: Linton Ham on 02/27/2021 11:07:01

## 2021-03-04 NOTE — Progress Notes (Signed)
IFEANYICHUKWU, WICKHAM ([id number]) Visit Report for 02/27/2021 Arrival Information Details Patient Name: Date of Service: LEDFORD, GOODSON 02/27/2021 9:30 A M Medical Record Number: [id number] Patient Account Number: [id number] Date of Birth/Sex: Treating RN: May 12, 1946 (75 y.o. [name], Meta.Reding Primary Care Jasyah Theurer: Consuello Masse Other Clinician: Referring Jeryn Bertoni: Treating Uriyah Raska/Extender: Zadie Cleverly in Treatment: 21 Visit Information History Since Last Visit Added or deleted any medications: No Patient Arrived: Ambulatory Any new allergies or adverse reactions: No Arrival Time: 09:35 Had a fall or experienced change in No Accompanied By: Wife activities of daily living that may affect Transfer Assistance: None risk of falls: Patient Identification Verified: Yes Signs or symptoms of abuse/neglect since last visito No Secondary Verification Process Completed: Yes Hospitalized since last visit: No Patient Requires Transmission-Based Precautions: No Implantable device outside of the clinic excluding No Patient Has Alerts: No cellular tissue based products placed in the center since last visit: Has Dressing in Place as Prescribed: Yes Pain Present Now: No Electronic Signature(s) Signed: 03/04/2021 7:50:07 AM By: Sandre Kitty Entered By: Sandre Kitty on 02/27/2021 09:35:51 -------------------------------------------------------------------------------- Encounter Discharge Information Details Patient Name: Date of Service: [name] 02/27/2021 9:30 A M Medical Record Number: [id number] Patient Account Number: [id number] Date of Birth/Sex: Treating RN: [birth year] (74 y.o. [name] Primary Care Elanie Hammitt: Consuello Masse Other Clinician: Referring Caylan Chenard: Treating Jeison Delpilar/Extender: Zadie Cleverly in Treatment: 21 Encounter Discharge Information Items Discharge Condition: Stable Ambulatory Status: Ambulatory Discharge  Destination: Home Transportation: Private Auto Accompanied By: friend Schedule Follow-up Appointment: Yes Clinical Summary of Care: Electronic Signature(s) Signed: 02/27/2021 5:36:38 PM By: Deon Pilling Entered By: Deon Pilling on 02/27/2021 10:16:40 -------------------------------------------------------------------------------- Lower Extremity Assessment Details Patient Name: Date of Service: ANNE, SEBRING 02/27/2021 9:30 A M Medical Record Number: [id number] Patient Account Number: [id number] Date of Birth/Sex: Treating RN: 1946/02/25 (75 y.o. [name] Primary Care Eulla Kochanowski: Consuello Masse Other Clinician: Referring Hassel Uphoff: Treating Foye Damron/Extender: Zadie Cleverly in Treatment: 21 Edema Assessment Assessed: Shirlyn Goltz: Yes] Patrice Paradise: No] Edema: [Left: N] [Right: o] Calf Left: Right: Point of Measurement: 40 cm From Medial Instep 36 cm Ankle Left: Right: Point of Measurement: 10 cm From Medial Instep 23 cm Vascular Assessment Pulses: Dorsalis Pedis Palpable: [Left:Yes] Electronic Signature(s) Signed: 02/27/2021 5:36:38 PM By: Deon Pilling Entered By: Deon Pilling on 02/27/2021 09:37:20 -------------------------------------------------------------------------------- Multi Wound Chart Details Patient Name: Date of Service: [name] 02/27/2021 9:30 A M Medical Record Number: [id number] Patient Account Number: [id number] Date of Birth/Sex: Treating RN: 12-13-1945 (74 y.o. [name] Primary Care Jisselle Poth: Consuello Masse Other Clinician: Referring Ladarius Seubert: Treating Media Pizzini/Extender: Zadie Cleverly in Treatment: 21 Vital Signs Height(in): 74 Capillary Blood Glucose(mg/dl): 111 Weight(lbs): 238 Pulse(bpm): 51 Body Mass Index(BMI): 31 Blood Pressure(mmHg): 110/67 Temperature(F): 97.8 Respiratory Rate(breaths/min): 18 Photos: [1:Left, Plantar Metatarsal head fifth N/A] [N/A:N/A] Wound Location:  [1:Gradually Appeared] [N/A:N/A] Wounding Event: [1:Diabetic Wound/Ulcer of the Lower] [N/A:N/A] Primary Etiology: [1:Extremity Sleep Apnea, Arrhythmia, Congestive N/A] Comorbid History: [1:Heart Failure, Coronary Artery Disease, Type II Diabetes, Osteoarthritis, Neuropathy 06/29/2020] [N/A:N/A] Date Acquired: [1:21] [N/A:N/A] Weeks of Treatment: [1:Open] [N/A:N/A] Wound Status: [1:0.5x0.7x0.5] [N/A:N/A] Measurements L x W x D (cm) [1:0.275] [N/A:N/A] A (cm) : rea [1:0.137] [N/A:N/A] Volume (cm) : [1:-287.30%] [N/A:N/A] % Reduction in A [1:rea: -552.40%] [N/A:N/A] % Reduction in Volume: [1:6] Starting Position 1 (o'clock): [1:8] Ending Position 1 (o'clock): [1:0.6] Maximum Distance 1 (cm): [1:Yes] [N/A:N/A] Undermining: [1:Grade 3] [N/A:N/A] Classification: [1:Medium] [N/A:N/A] Exudate A  mount: [1:Serosanguineous] [N/A:N/A] Exudate Type: [1:red, brown] [N/A:N/A] Exudate Color: [1:Well defined, not attached] [N/A:N/A] Wound Margin: [1:Large (67-100%)] [N/A:N/A] Granulation A mount: [1:Red] [N/A:N/A] Granulation Quality: [1:Small (1-33%)] [N/A:N/A] Necrotic A mount: [1:Fat Layer (Subcutaneous Tissue): Yes N/A] Exposed Structures: [1:Tendon: Yes Fascia: No Muscle: No Joint: No Bone: No Small (1-33%)] [N/A:N/A] Epithelialization: [1:Debridement - Excisional] [N/A:N/A] Debridement: Pre-procedure Verification/Time Out 10:10 [N/A:N/A] Taken: [1:Lidocaine 4% T opical Solution] [N/A:N/A] Pain Control: [1:Callus, Subcutaneous] [N/A:N/A] Tissue Debrided: [1:Skin/Subcutaneous Tissue] [N/A:N/A] Level: [1:0.35] [N/A:N/A] Debridement A (sq cm): [1:rea Curette] [N/A:N/A] Instrument: [1:Moderate] [N/A:N/A] Bleeding: [1:Silver Nitrate] [N/A:N/A] Hemostasis A chieved: [1:0] [N/A:N/A] Procedural Pain: [1:0] [N/A:N/A] Post Procedural Pain: [1:Procedure was tolerated well] [N/A:N/A] Debridement Treatment Response: [1:0.8x0.9x0.5] [N/A:N/A] Post Debridement Measurements L x W x D (cm) [1:0.283]  [N/A:N/A] Post Debridement Volume: (cm) [1:Debridement] [N/A:N/A] Treatment Notes Wound #1 (Metatarsal head fifth) Wound Laterality: Plantar, Left Cleanser Normal Saline Discharge Instruction: Cleanse the wound with Normal Saline prior to applying a clean dressing using gauze sponges, not tissue or cotton balls. Soap and Water Discharge Instruction: May shower and wash wound with dial antibacterial soap and water prior to dressing change. Wound Cleanser Discharge Instruction: Cleanse the wound with wound cleanser prior to applying a clean dressing using gauze sponges, not tissue or cotton balls. Peri-Wound Care Topical Primary Dressing IODOFLEX 0.9% Cadexomer Iodine Pad 4x6 cm Discharge Instruction: Apply to wound bed and undermining as instructed Secondary Dressing Woven Gauze Sponges 2x2 in Discharge Instruction: Apply over primary dressing as directed. Bordered Gauze, 2x3.75 in Discharge Instruction: Apply over primary dressing as directed. Secured With [employer], 4.5x3.1 (in/yd) Discharge Instruction: Secure with Kerlix as directed. 29M Medipore H Soft Cloth Surgical T ape, 2x2 (in/yd) Discharge Instruction: Secure dressing with tape as directed. Compression Wrap Compression Stockings Add-Ons Electronic Signature(s) Signed: 02/27/2021 5:31:22 PM By: Linton Ham MD Signed: 02/27/2021 5:36:38 PM By: Deon Pilling Entered By: Linton Ham on 02/27/2021 11:02:18 -------------------------------------------------------------------------------- Multi-Disciplinary Care Plan Details Patient Name: Date of Service: [name] 02/27/2021 9:30 A M Medical Record Number: [id number] Patient Account Number: [id number] Date of Birth/Sex: Treating RN: 08/30/1945 (74 y.o. [name] Primary Care Lorrie Strauch: Consuello Masse Other Clinician: Referring Ashanti Ratti: Treating Keionna Kinnaird/Extender: Zadie Cleverly in Treatment: 21 Multidisciplinary Care Plan  reviewed with physician Active Inactive Wound/Skin Impairment Nursing Diagnoses: Impaired tissue integrity Goals: Patient/caregiver will verbalize understanding of skin care regimen Date Initiated: 09/27/2020 Target Resolution Date: 04/10/2021 Goal Status: Active Ulcer/skin breakdown will have a volume reduction of 30% by week 4 Date Initiated: 09/27/2020 Date Inactivated: 11/18/2020 Target Resolution Date: 11/15/2020 Goal Status: Met Interventions: Assess patient/caregiver ability to obtain necessary supplies Assess patient/caregiver ability to perform ulcer/skin care regimen upon admission and as needed Assess ulceration(s) every visit Provide education on ulcer and skin care Treatment Activities: Skin care regimen initiated : 09/27/2020 Topical wound management initiated : 09/27/2020 Notes: 10/25/20: Wound not yet at 30% volume reduction. Been on antibiotics, Xray obtained and Vascular Referral sent. Target date extended. Electronic Signature(s) Signed: 02/27/2021 5:36:38 PM By: Deon Pilling Entered By: Deon Pilling on 02/27/2021 09:37:50 -------------------------------------------------------------------------------- Pain Assessment Details Patient Name: Date of Service: DAMIEAN, LUKES 02/27/2021 9:30 A M Medical Record Number: [id number] Patient Account Number: [id number] Date of Birth/Sex: Treating RN: 1945/11/09 (75 y.o. [name] Primary Care Ryn Peine: Consuello Masse Other Clinician: Referring Carliss Quast: Treating Elizabethann Lackey/Extender: Zadie Cleverly in Treatment: 21 Active Problems Location of Pain Severity and Description of Pain Patient Has Paino No Site Locations Rate  the pain. Current Pain Level: 0 Pain Management and Medication Current Pain Management: Medication: No Cold Application: No Rest: No Massage: No Activity: No T.E.N.S.: No Heat Application: No Leg drop or elevation: No Is the Current Pain Management Adequate: Adequate How  does your wound impact your activities of daily livingo Sleep: No Bathing: No Appetite: No Relationship With Others: No Bladder Continence: No Emotions: No Bowel Continence: No Work: No Toileting: No Drive: No Dressing: No Hobbies: No Electronic Signature(s) Signed: 02/27/2021 5:36:38 PM By: Deon Pilling Entered By: Deon Pilling on 02/27/2021 09:36:42 -------------------------------------------------------------------------------- Patient/Caregiver Education Details Patient Name: Date of Service: [name] 9/1/2022andnbsp9:30 [place] Record Number: [id number] Patient Account Number: [id number] Date of Birth/Gender: Treating RN: 21-Dec-1945 (75 y.o. [name] Primary Care Physician: Consuello Masse Other Clinician: Referring Physician: Treating Physician/Extender: Zadie Cleverly in Treatment: 21 Education Assessment Education Provided To: Patient Education Topics Provided Wound/Skin Impairment: Handouts: Skin Care Do's and Dont's Methods: Explain/Verbal Responses: Reinforcements needed Electronic Signature(s) Signed: 02/27/2021 5:36:38 PM By: Deon Pilling Entered By: Deon Pilling on 02/27/2021 09:38:04 -------------------------------------------------------------------------------- Wound Assessment Details Patient Name: Date of Service: [name] 02/27/2021 9:30 A M Medical Record Number: [id number] Patient Account Number: [id number] Date of Birth/Sex: Treating RN: 07-Dec-1945 (74 y.o. [name] Primary Care Saphronia Ozdemir: Consuello Masse Other Clinician: Referring Margaurite Salido: Treating Jowell Bossi/Extender: Zadie Cleverly in Treatment: 21 Wound Status Wound Number: 1 Primary Diabetic Wound/Ulcer of the Lower Extremity Etiology: Wound Location: Left, Plantar Metatarsal head fifth Wound Open Wounding Event: Gradually Appeared Status: Date Acquired: 06/29/2020 Comorbid Sleep Apnea, Arrhythmia, Congestive  Heart Failure, Coronary Weeks Of Treatment: 21 History: Artery Disease, Type II Diabetes, Osteoarthritis, Neuropathy Clustered Wound: No Photos Wound Measurements Length: (cm) 0.5 Width: (cm) 0.7 Depth: (cm) 0.5 Area: (cm) 0.275 Volume: (cm) 0.137 % Reduction in Area: -287.3% % Reduction in Volume: -552.4% Epithelialization: Small (1-33%) Undermining: Yes Starting Position (o'clock): 6 Ending Position (o'clock): 8 Maximum Distance: (cm) 0.6 Wound Description Classification: Grade 3 Wound Margin: Well defined, not attached Exudate Amount: Medium Exudate Type: Serosanguineous Exudate Color: red, brown Wound Bed Granulation Amount: Large (67-100%) Granulation Quality: Red Necrotic Amount: Small (1-33%) Necrotic Quality: Adherent Slough Foul Odor After Cleansing: No Slough/Fibrino Yes Exposed Structure Fascia Exposed: No Fat Layer (Subcutaneous Tissue) Exposed: Yes Tendon Exposed: Yes Muscle Exposed: No Joint Exposed: No Bone Exposed: No Treatment Notes Wound #1 (Metatarsal head fifth) Wound Laterality: Plantar, Left Cleanser Normal Saline Discharge Instruction: Cleanse the wound with Normal Saline prior to applying a clean dressing using gauze sponges, not tissue or cotton balls. Soap and Water Discharge Instruction: May shower and wash wound with dial antibacterial soap and water prior to dressing change. Wound Cleanser Discharge Instruction: Cleanse the wound with wound cleanser prior to applying a clean dressing using gauze sponges, not tissue or cotton balls. Peri-Wound Care Topical Primary Dressing IODOFLEX 0.9% Cadexomer Iodine Pad 4x6 cm Discharge Instruction: Apply to wound bed and undermining as instructed Secondary Dressing Woven Gauze Sponges 2x2 in Discharge Instruction: Apply over primary dressing as directed. Bordered Gauze, 2x3.75 in Discharge Instruction: Apply over primary dressing as directed. Secured With [employer], 4.5x3.1  (in/yd) Discharge Instruction: Secure with Kerlix as directed. 35M Medipore H Soft Cloth Surgical T ape, 2x2 (in/yd) Discharge Instruction: Secure dressing with tape as directed. Compression Wrap Compression Stockings Add-Ons Electronic Signature(s) Signed: 02/27/2021 5:36:38 PM By: Deon Pilling Signed: 03/04/2021 7:50:07 AM By: Sandre Kitty Entered By: Sandre Kitty on 02/27/2021 09:34:57 --------------------------------------------------------------------------------  Vitals Details Patient Name: Date of Service: GERADO, NABERS 02/27/2021 9:30 A M Medical Record Number: [id number] Patient Account Number: [id number] Date of Birth/Sex: Treating RN: 12-Sep-1945 (75 y.o. [name] Primary Care Jevaughn Degollado: Consuello Masse Other Clinician: Referring Shayna Eblen: Treating Harry Shuck/Extender: Zadie Cleverly in Treatment: 21 Vital Signs Time Taken: 09:35 Temperature (F): 97.8 Height (in): 74 Pulse (bpm): 59 Weight (lbs): 238 Respiratory Rate (breaths/min): 18 Body Mass Index (BMI): 30.6 Blood Pressure (mmHg): 110/67 Capillary Blood Glucose (mg/dl): 111 Reference Range: 80 - 120 mg / dl Electronic Signature(s) Signed: 03/04/2021 7:50:07 AM By: Sandre Kitty Entered By: Sandre Kitty on 02/27/2021 09:36:26

## 2021-03-06 ENCOUNTER — Other Ambulatory Visit: Payer: Self-pay

## 2021-03-06 ENCOUNTER — Encounter (HOSPITAL_BASED_OUTPATIENT_CLINIC_OR_DEPARTMENT_OTHER): Payer: Medicare Other | Admitting: Internal Medicine

## 2021-03-06 DIAGNOSIS — E1169 Type 2 diabetes mellitus with other specified complication: Secondary | ICD-10-CM | POA: Diagnosis not present

## 2021-03-06 DIAGNOSIS — E1151 Type 2 diabetes mellitus with diabetic peripheral angiopathy without gangrene: Secondary | ICD-10-CM | POA: Diagnosis not present

## 2021-03-06 DIAGNOSIS — L97528 Non-pressure chronic ulcer of other part of left foot with other specified severity: Secondary | ICD-10-CM | POA: Diagnosis not present

## 2021-03-06 DIAGNOSIS — E1142 Type 2 diabetes mellitus with diabetic polyneuropathy: Secondary | ICD-10-CM | POA: Diagnosis not present

## 2021-03-06 DIAGNOSIS — L97524 Non-pressure chronic ulcer of other part of left foot with necrosis of bone: Secondary | ICD-10-CM | POA: Diagnosis not present

## 2021-03-06 DIAGNOSIS — Z1629 Resistance to other single specified antibiotic: Secondary | ICD-10-CM | POA: Diagnosis not present

## 2021-03-06 DIAGNOSIS — Z1611 Resistance to penicillins: Secondary | ICD-10-CM | POA: Diagnosis not present

## 2021-03-06 DIAGNOSIS — B957 Other staphylococcus as the cause of diseases classified elsewhere: Secondary | ICD-10-CM | POA: Diagnosis not present

## 2021-03-06 DIAGNOSIS — M86272 Subacute osteomyelitis, left ankle and foot: Secondary | ICD-10-CM | POA: Diagnosis not present

## 2021-03-06 DIAGNOSIS — E11621 Type 2 diabetes mellitus with foot ulcer: Secondary | ICD-10-CM | POA: Diagnosis not present

## 2021-03-06 NOTE — Progress Notes (Signed)
[name] [name], [name] [name] ([id number]) Visit Report for 03/06/2021 Debridement Details Patient Name: Date of Service: [name] [name], [name] [name] 03/06/2021 9:30 A M Medical Record Number: [id number] Patient Account Number: [id number] Date of Birth/Sex: Treating RN: Feb 05, 1946 (75 y.o. [name] [name] Primary Care Provider: Consuello Masse Other Clinician: Referring Provider: Treating Provider/Extender: Zadie Cleverly in Treatment: 22 Debridement Performed for Assessment: Wound #1 Left,Plantar Metatarsal head fifth Performed By: Physician Ricard Dillon., MD Debridement Type: Debridement Severity of Tissue Pre Debridement: Bone involvement without necrosis Level of Consciousness (Pre-procedure): Awake and Alert Pre-procedure Verification/Time Out Yes - 09:45 Taken: Start Time: 09:46 T Area Debrided (L x W): otal 0.2 (cm) x 0.2 (cm) = 0.04 (cm) Tissue and other material debrided: Bone Level: Skin/Subcutaneous Tissue/Muscle/Bone Debridement Description: Excisional Instrument: Rongeur Specimen: Swab, Number of Specimens T aken: 1 Bleeding: Moderate Hemostasis Achieved: Pressure End Time: 09:47 Procedural Pain: 0 Post Procedural Pain: 0 Response to Treatment: Procedure was tolerated well Level of Consciousness (Post- Awake and Alert procedure): Post Debridement Measurements of Total Wound Length: (cm) 1.4 Width: (cm) 1.3 Depth: (cm) 1.3 Volume: (cm) 1.858 Character of Wound/Ulcer Post Debridement: Requires Further Debridement Severity of Tissue Post Debridement: Necrosis of bone Post Procedure Diagnosis Same as Pre-procedure Electronic Signature(s) Signed: 03/06/2021 4:26:03 PM By: Linton Ham MD Signed: 03/06/2021 5:40:25 PM By: Deon Pilling RN, BSN Entered By: Linton Ham on 03/06/2021 10:20:41 -------------------------------------------------------------------------------- HPI Details Patient Name: Date of Service: [name] [name] 03/06/2021 9:30 A M Medical  Record Number: [id number] Patient Account Number: [id number] Date of Birth/Sex: Treating RN: [birth year] (75 y.o. [name] [name] Primary Care Provider: Consuello Masse Other Clinician: Referring Provider: Treating Provider/Extender: Zadie Cleverly in Treatment: 3 History of Present Illness HPI Description: ADMISSION 09/27/2020 This is a 75 year old man who lives in [place]. He apparently has had callus over the plantar fifth metatarsal head in the past for which she is followed by podiatry in [place]. They shaved down the callus and this was done in January. He states that he went to [place] in January and became aware of when he was there of an open wound in this area. He followed up with podiatry and has been soaking this twice a day with Epson salts and using Silvadene. Apparently one of the podiatrist told him he may need surgery because of bone protrusion. Past medical history includes an ischemic cardiomyopathy, type 2 diabetes with peripheral neuropathy, BPH, PVD, orthostatic hypotension, atrial fibrillation, hyperlipidemia and hypertension Arterial studies in 2018 showed a noncompressible ABI on the left. It was again noncompressible today at 1.7 although his pulses are easily palpable 4/8; patient I admitted to the clinic last week. Wound on the plantar left fifth metatarsal head which has been refractory. He also has a bit of subluxation of the bone in the head. He is a type II diabetic with peripheral neuropathy. We used silver collagen after debridement He arrives back in clinic today. He has erythema spreading into the lateral part of the fifth metatarsal head. I removed some undermining tissue from around the wound and clearly there is a connection between the wound and the lateral part of the fifth met head. A culture was done. Area of erythema on the lateral part of the foot was marked. His wife says that has been there since Wednesday 4/15; the patient  arrived last week with erythema spreading in the lateral part of the fifth metatarsal head. There was a wound connected with the plantar fifth  met head original wound. I gave him empiric Augmentin. Surprisingly the culture I did was negative. We have been using silver alginate. The wound has been open since January. He arrives in clinic today with the erythema much better 4/22; patient presents today for 1 week follow-up of his fifth metatarsal head wound. Daughter is present with the patient. She states that the wound has improved significantly since 2 weeks ago. There is no longer redness to the foot and the actual wound bed is smaller. Patient overall feels well and has no complaints today. 4/29; patient presents for 1 week follow-up of his fifth metatarsal head wound. Daughter is present. Patient has been using silver alginate every other day to the wound site. He has tried to relieve pressure with a surgical shoe. He denies signs of infections. 5/6; fifth metatarsal head wound. He has been using silver alginate changed to Santyl last week which he managed to obtain for $21 with a coupon we gave him. He also has an appointment with vein and vascular. I think Dr. Heber Los Prados referred him. He has noncompressible ABIs. The patient also had an x-ray and that was negative 5/13; the patient went to see [name]. Areas on the left fifth metatarsal head plantar and lateral. [name] noted that his ABI in the right was 0.99 but monophasic waveforms with a TBI at 0.32. He wanted to go ahead with an angiogram which is booked for Thursday. I talked to the patient about this and advised him to go forward with this. 5/23; patient presents for 1 week follow-up. He has been using silver alginate to the wound bed. He has no complaints today. Denies signs of infection. He had an arteriogram done on 5/19. 5/31; angiogram showed patent common femoral, SFA, profunda, above-knee popliteal artery. 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 He does have inline flow down the left lower extremity through the peroneal that is widely patent. The peroneal gave off collaterals distally to the anterior tibial that feels retrograde at the ankle. An attempted recanalization of the left anterior tibial artery was made but was not successful. The plan as outlined by [name] is that he will continue getting aggressive wound care and he would consider a retrograde left anterior tibial intervention if there is no improvement. Wound itself does not look as though it is making any improvement. There is still tunneling superiorly precariously close to bone 6/3; back for the obligatory first total contact cast change. The wound clearly has exposed bone. He is a type II diabetic. He has been revascularized. Plain x- ray did not show osteomyelitis he may require an MRI. After his revascularization I elected to put him into a cast to make sure that all the pressure was coming off this area 6/7; patient back for his routine visit. The wound is not doing well. The patient had a lot of discomfort since he was last here 4 days ago. No systemic illness. He will not go back in the total contact cast today. 6/14; culture I did last time showed Enterobacter. At the time there was erythema and swelling I put him on Augmentin and Bactrim empirically. The Enterobacter was sensitive to the Bactrim DS. He sees [name] tomorrow. I am looking for her recommendation about antibiotic choices and route of administration. He sees [name] of vascular surgery on 6/21. I have communicated with [name] I would like to see  whether or not further revascularization will be entertained and whether [name] feels that he could survive a foot conserving surgery if that is the route the patient wishes to go. After these next 2 consultations we may be able to discuss  the route the patient wants to take either a surgical 1 or perhaps 1 that involves a prolonged course of antibiotics and hyperbaric oxygen. I touch bases with the patient and his daughter on all of this. MRI suggested osteomyelitis in the distal 2 cm of the fifth metatarsal 6/24; the patient was kindly seen by [name] of infectious disease by telehealth visit. He has established a PICC line and is on a Carbapenem for 4 weeks although I am not really sure which 1. Initial culture was Enterobacter I believe this was a swab. He is also followed up with [name]. He again is considering an anterograde anterior tibial attempted revascularization. The patient is to call if they agree to move forward with the anterograde attempt. He started an IV antibiotics already. He arrives in clinic today with again the wound deteriorating. There is exposed bone widely which is quite a bit worse than last week. I did obtain a piece of this for culture but more bone debridement is likely going to need to be done. I had an extensive discussion with the patient and his daughter. The problem is the ray amputation success depends largely on blood flow. [name] did not sound optimistic about this. However I would definitely go ahead with the retrograde attempt by [name] even though there are potential risks. I explained this to the patient. If we are going to have any attempt to salvage this man's foot we are going to need blood flow and I would go ahead with this. IV antibiotics in the meantime and consideration of hyperbaric oxygen all of these things I discussed. The option would be a below-knee amputation which the patient does not seem ready for his daughter is certainly not ready for. But we also talked about this 6/30; bone culture I did last week showed the same Enterobacter cloacae I as previous. Also rare Staph epidermidis. I am not really sure of the significance of this which may be a contamination. He  has a anterior grade access attempt by [name] of vein and vascular on 7/7. After we see the result of this I will go over his options which are continued antibiotics and hyperbarics versus an attempt at a ray amputation if [name] is successful in establishing more blood flow here. 7/12 the patient saw [name] yesterday who extended his IV antibiotics. He also had an attempt at left anterior tibial artery retrograde revascularization by [name] however this was not successful. We are therefore looking at the same blood flow that we had previously. I had an extensive discussion with him today. I think he has a good chance that healing ray amputation. The other option is to continue his IV antibiotics and initiate hyperbaric oxygen therapy. The patient seems to be opting for the latter 7/19; continues on IV antibiotics per the patient until August 3. He is going to wait to [facility] sometime after that. He is approved for HBO but I do not think we will be able to get that going until sometime in the August 20 21st range per he has been using silver alginate 7/26; continues on IV antibiotics as directed by [name]. This will apparently end on August 3 and then  the patient thinks he will go on oral antibiotics after that. The patient is leaving on vacation from 8/10 through 8/17. There is no point in putting him through hyperbarics at this point. His wound has improved he has been using silver alginate at 1 point about 33% of this was exposed bone all of this is covered by granulation now. 8/18; patient was away on vacation therefore we did not initiate HBO. He has completed IV antibiotics as well as 2 weeks of oral Septra as directed by Dr. Megan Salon. He is follows up tomorrow and expects to be discharged. He has been using silver alginate. The wound measures smaller 9/1 small wound on the left lateral fifth met head. Still some undermining. Somewhat friable mucosa but no overt  infection this does not probe to bone there is no surrounding erythema. We have been using silver collagen. He saw Dr. Megan Salon in follow-up for his underlying osteomyelitis.Marland Kitchen He has completed 6 weeks of IV ertapenem and then 2 weeks of oral trimethoprim/sulfamethoxazole. He is now off antibiotics 9/8. He comes in today with a remarkable deterioration. His daughter is able to show pictures this had deteriorated a lot by Monday. When they took off the dressing today there look like serosanguineous drainage and bleeding. There is easily probing bone. He was treated with IV antibiotics and then a course of oral antibiotics which she completed in early August I believe. He had been doing well. He also has known PAD status post attempts at revascularization by [name] which we are not successful in the anterior tibial artery either anterograde or retrograde. According my records he had a peroneal artery that fed the anterior tibial artery by collaterals.. As mentioned they were not successful in revascularizing anterior tibial artery. Electronic Signature(s) Signed: 03/06/2021 4:26:03 PM By: Linton Ham MD Entered By: Linton Ham on 03/06/2021 10:25:35 -------------------------------------------------------------------------------- Physical Exam Details Patient Name: Date of Service: [name] [name] 03/06/2021 9:30 A M Medical Record Number: [id number] Patient Account Number: [id number] Date of Birth/Sex: Treating RN: [birth year] (74 y.o. [name] [name] Primary Care Provider: Consuello Masse Other Clinician: Referring Provider: Treating Provider/Extender: Zadie Cleverly in Treatment: 22 Constitutional Sitting or standing Blood Pressure is within target range for patient.. Pulse regular and within target range for patient.Marland Kitchen Respirations regular, non-labored and within target range.. Temperature is normal and within the target range for the patient.Marland Kitchen Appears in no  distress. Notes Wound exam; left lateral fifth metatarsal head. Marked deterioration from last week. There is no wide areas of exposed bone surrounding necrotic tissue and erythema there is tunneling under the metatarsal which is widely exposed. I used rongeurs to remove a piece of necrotic bone for PCR culture Electronic Signature(s) Signed: 03/06/2021 4:26:03 PM By: Linton Ham MD Entered By: Linton Ham on 03/06/2021 10:29:53 -------------------------------------------------------------------------------- Physician Orders Details Patient Name: Date of Service: [name] [name] 03/06/2021 9:30 A M Medical Record Number: [id number] Patient Account Number: [id number] Date of Birth/Sex: Treating RN: [birth year] (75 y.o. [name] [name] Primary Care Provider: Consuello Masse Other Clinician: Referring Provider: Treating Provider/Extender: Zadie Cleverly in Treatment: 13 Verbal / Phone Orders: No Diagnosis Coding ICD-10 Coding Code Description E11.621 Type 2 diabetes mellitus with foot ulcer E11.51 Type 2 diabetes mellitus with diabetic peripheral angiopathy without gangrene L97.528 Non-pressure chronic ulcer of other part of left foot with other specified severity M86.272 Subacute osteomyelitis, left ankle and foot E11.42 Type 2 diabetes mellitus with diabetic polyneuropathy Follow-up Appointments ppointment in  1 week. - Dr. Dellia Nims Return A Referral back to infectious disease, [facility]. Call their offices if you have not heard from them by Mid morning tomorrow as the referrals are sent a urgent. Start taking the oral antibiotics today. [facility] should ship the rest of the oral antibiotics to you today. ***If fever, chills, nausea, vomiting, redness increases up the foot and leg, or increased pain go to the Emergency Department. Bathing/ Shower/ Hygiene May shower and wash wound with soap and water. - on days changing dressing. Do not soak  foot. Edema Control - Lymphedema / SCD / Other Elevate legs to the level of the heart or above for 30 minutes daily and/or when sitting, a frequency of: - throughout the day Avoid standing for long periods of time. Exercise regularly Off-Loading Open toe surgical shoe to: - left foot Additional Orders / Instructions Follow Nutritious Diet Wound Treatment Wound #1 - Metatarsal head fifth Wound Laterality: Plantar, Left Cleanser: Normal Saline 1 x Per Day/30 Days Discharge Instructions: Cleanse the wound with Normal Saline prior to applying a clean dressing using gauze sponges, not tissue or cotton balls. Cleanser: Soap and Water 1 x Per Day/30 Days Discharge Instructions: May shower and wash wound with dial antibacterial soap and water prior to dressing change. Cleanser: Wound Cleanser 1 x Per Day/30 Days Discharge Instructions: Cleanse the wound with wound cleanser prior to applying a clean dressing using gauze sponges, not tissue or cotton balls. Prim Dressing: KerraCel Ag Gelling Fiber Dressing, 2x2 in (silver alginate) 1 x Per Day/30 Days ary Discharge Instructions: Apply silver alginate to wound bed as instructed Secondary Dressing: Woven Gauze Sponge, Non-Sterile 4x4 in 1 x Per Day/30 Days Discharge Instructions: Apply over primary dressing as directed. Secondary Dressing: ABD Pad, 5x9 1 x Per Day/30 Days Discharge Instructions: Apply over primary dressing as directed. Secured With: [employer], 4.5x3.1 (in/yd) 1 x Per Day/30 Days Discharge Instructions: Secure with Kerlix as directed. Secured With: 47M Medipore H Soft Cloth Surgical Tape, 2x2 (in/yd) 1 x Per Day/30 Days Discharge Instructions: Secure dressing with tape as directed. Consults Infectious Disease - ****STAT**** infectious disease referral related to left lateral foot wound worsening bone exposed, periwound edema with redness. - (ICD10 M01.027 - Subacute osteomyelitis, left ankle and foot) Vascular -  ****STAT***** [facility] Referral back [name] related to left lateral foot wound worsening with bone exposed. - (ICD10 C580633 - Subacute osteomyelitis, left ankle and foot) Laboratory erobe culture (MICRO) - PCR culture of left lateral foot wound. - (ICD10 E11.621 - Type 2 Bacteria identified in Unspecified specimen by A diabetes mellitus with foot ulcer) LOINC Code: 253-6 Convenience Name: Areobic culture-specimen not specified [facility] orthopedic referral - ****STAT***** [facility] Referral with Dr. Doran Durand related to exposed bone of left lateral foot wound. - (ICD10 C580633 - Subacute osteomyelitis, left ankle and foot) Patient Medications llergies: No Known Allergies A Notifications Medication Indication Start End 03/06/2021 Nuzyra DOSE oral 150 mg tablet - 2 tablet oral daily x5 days; loading dose today 03/06/2021 Electronic Signature(s) Signed: 03/06/2021 4:26:03 PM By: Linton Ham MD Signed: 03/06/2021 5:40:25 PM By: Deon Pilling RN, BSN Entered By: Deon Pilling on 03/06/2021 10:21:08 Prescription 03/06/2021 -------------------------------------------------------------------------------- Leta Speller MD Patient Name: Provider: 1945/12/08 [id number] Date of Birth: NPI#Jerilynn Mages [id number] Sex: DEA #: [id number] [id number] Phone #: License #: [place] Patient Address: [place] [address] [place], [place] [address] [address] [place], [place] [address] [phone] Allergies No Known Allergies Provider's Orders Infectious Disease - ICD10: T06.269 - ****STAT**** infectious disease referral related to left lateral foot wound worsening bone exposed, periwound edema with redness. Hand Signature: Date(s): Prescription 03/06/2021 Leta Speller MD Patient Name: Provider: 04/11/1946 [id number] Date of Birth: NPI#: Jerilynn Mages [id number] Sex: DEA #: [phone] Phone  #: License #: [place] Patient Address: [place], [place] [address] [place], [place] [address] [phone] Allergies No Known Allergies Provider's Orders orthopedic referral - ICD10: R15.400 - ****STAT***** [facility] Referral with Dr. Doran Durand related to exposed bone of left lateral foot wound. Hand Signature: Date(s): Prescription 03/06/2021 Leta Speller MD Patient Name: Provider: 1946-05-05 [id number] Date of Birth: NPI#: Jerilynn Mages [id number] Sex: DEA #: [phone] Phone #: License #: [place] Patient Address: [place], [place] [address] [place], [place] [address] [phone] Allergies No Known Allergies Provider's Orders Vascular - ICD10: A83.419 - ****STAT***** [facility] Referral back [name] related to left lateral foot wound worsening with bone exposed. Hand Signature: Date(s): Prescription 03/06/2021 Leta Speller MD Patient Name: Provider: 08-05-1945 [id number] Date of Birth: NPI#: Jerilynn Mages [id number] Sex: DEA #: [phone] Phone #: License #: [place] Patient Address: [address] [place], [place] [address] [place], [place] [address] [phone] Allergies No Known Allergies Medication Medication: Route: Strength: Form: Nuzyra oral 150 mg tablet Class: TETRACYCLINE ANTIBIOTICS Dose: Frequency / Time: Indication: 2 tablet oral daily x5 days; loading dose today 03/06/2021 Number of Refills: Number of Units: 0 Generic Substitution: Start Date: End Date: Administered at Facility: Substitution Permitted 09/27/2876 No Note to Pharmacy: Hand Signature: Date(s): Electronic Signature(s) Signed: 03/06/2021 4:26:03 PM By: Linton Ham MD Signed: 03/06/2021 5:40:25 PM By: Deon Pilling RN, BSN Entered By: Deon Pilling on 03/06/2021 10:21:08 -------------------------------------------------------------------------------- Problem List Details Patient Name: Date of Service: [name] [name] 03/06/2021 9:30 A M Medical Record Number: [id number] Patient Account Number: [id number] Date of Birth/Sex: Treating RN: 1946-06-23 (75 y.o. [name] [name] Primary Care Provider: Consuello Masse Other Clinician: Referring Provider: Treating Provider/Extender: Zadie Cleverly in Treatment: 57 Active Problems ICD-10 Encounter Code Description Active Date MDM Diagnosis E11.621 Type 2 diabetes mellitus with foot ulcer 09/27/2020 No Yes E11.51 Type 2 diabetes mellitus with diabetic peripheral angiopathy without gangrene 11/11/2020 No Yes L97.528 Non-pressure chronic ulcer of other part of left foot with other specified 09/27/2020 No Yes severity M86.272 Subacute osteomyelitis, left ankle and foot 12/10/2020 No Yes E11.42 Type 2 diabetes mellitus with diabetic polyneuropathy 09/27/2020 No Yes Inactive Problems ICD-10 Code Description Active Date Inactive Date L03.116 Cellulitis of left lower limb 10/04/2020 10/04/2020 Resolved Problems Electronic Signature(s) Signed: 03/06/2021 4:26:03 PM By: Linton Ham MD Entered By: Linton Ham on 03/06/2021 10:18:20 -------------------------------------------------------------------------------- Progress Note Details Patient Name: Date of Service: [name] [name] 03/06/2021 9:30 A M Medical Record Number: [id number] Patient Account Number: [id number] Date of Birth/Sex: Treating RN: 06/13/1946 (75 y.o. [name] [name] Primary Care Provider: Consuello Masse Other Clinician: Referring Provider: Treating Provider/Extender: Zadie Cleverly in Treatment: 22 Subjective History of Present Illness (HPI) ADMISSION 09/27/2020 This is a 75 year old man who lives in [place]. He apparently has had  callus over  the plantar fifth metatarsal head in the past for which she is followed by podiatry in [place]. They shaved down the callus and this was done in January. He states that he went to [place] in January and became aware of when he was there of an open wound in this area. He followed up with podiatry and has been soaking this twice a day with Epson salts and using Silvadene. Apparently one of the podiatrist told him he may need surgery because of bone protrusion. Past medical history includes an ischemic cardiomyopathy, type 2 diabetes with peripheral neuropathy, BPH, PVD, orthostatic hypotension, atrial fibrillation, hyperlipidemia and hypertension Arterial studies in 2018 showed a noncompressible ABI on the left. It was again noncompressible today at 1.7 although his pulses are easily palpable 4/8; patient I admitted to the clinic last week. Wound on the plantar left fifth metatarsal head which has been refractory. He also has a bit of subluxation of the bone in the head. He is a type II diabetic with peripheral neuropathy. We used silver collagen after debridement He arrives back in clinic today. He has erythema spreading into the lateral part of the fifth metatarsal head. I removed some undermining tissue from around the wound and clearly there is a connection between the wound and the lateral part of the fifth met head. A culture was done. Area of erythema on the lateral part of the foot was marked. His wife says that has been there since Wednesday 4/15; the patient arrived last week with erythema spreading in the lateral part of the fifth metatarsal head. There was a wound connected with the plantar fifth met head original wound. I gave him empiric Augmentin. Surprisingly the culture I did was negative. We have been using silver alginate. The wound has been open since January. He arrives in clinic today with the erythema much better 4/22; patient presents today for 1 week follow-up of his  fifth metatarsal head wound. Daughter is present with the patient. She states that the wound has improved significantly since 2 weeks ago. There is no longer redness to the foot and the actual wound bed is smaller. Patient overall feels well and has no complaints today. 4/29; patient presents for 1 week follow-up of his fifth metatarsal head wound. Daughter is present. Patient has been using silver alginate every other day to the wound site. He has tried to relieve pressure with a surgical shoe. He denies signs of infections. 5/6; fifth metatarsal head wound. He has been using silver alginate changed to Santyl last week which he managed to obtain for $21 with a coupon we gave him. He also has an appointment with vein and vascular. I think Dr. Heber Madelia referred him. He has noncompressible ABIs. The patient also had an x-ray and that was negative 5/13; the patient went to see [name]. Areas on the left fifth metatarsal head plantar and lateral. [name] noted that his ABI in the right was 0.99 but monophasic waveforms with a TBI at 0.32. He wanted to go ahead with an angiogram which is booked for Thursday. I talked to the patient about this and advised him to go forward with this. 5/23; patient presents for 1 week follow-up. He has been using silver alginate to the wound bed. He has no complaints today. Denies signs of infection. He had an arteriogram done on 5/19. 5/31; angiogram showed patent common femoral, SFA, profunda, above-knee popliteal artery. He had significant tibial disease however with occlusion in the anterior tibial and posterior  tibial occluding in the mid calf. He does have inline flow down the left lower extremity through the peroneal that is widely patent. The peroneal gave off collaterals distally to the anterior tibial that feels retrograde at the ankle. An attempted recanalization of the left anterior tibial artery was made but was not successful. The plan as outlined by Dr.  Carlis Abbott is that he will continue getting aggressive wound care and he would consider a retrograde left anterior tibial intervention if there is no improvement. Wound itself does not look as though it is making any improvement. There is still tunneling superiorly precariously close to bone 6/3; back for the obligatory first total contact cast change. The wound clearly has exposed bone. He is a type II diabetic. He has been revascularized. Plain x- ray did not show osteomyelitis he may require an MRI. After his revascularization I elected to put him into a cast to make sure that all the pressure was coming off this area 6/7; patient back for his routine visit. The wound is not doing well. The patient had a lot of discomfort since he was last here 4 days ago. No systemic illness. He will not go back in the total contact cast today. 6/14; culture I did last time showed Enterobacter. At the time there was erythema and swelling I put him on Augmentin and Bactrim empirically. The Enterobacter was sensitive to the Bactrim DS. He sees [name] tomorrow. I am looking for her recommendation about antibiotic choices and route of administration. He sees [name] of vascular surgery on 6/21. I have communicated with [name] I would like to see whether or not further revascularization will be entertained and whether [name] feels that he could survive a foot conserving surgery if that is the route the patient wishes to go. After these next 2 consultations we may be able to discuss the route the patient wants to take either a surgical 1 or perhaps 1 that involves a prolonged course of antibiotics and hyperbaric oxygen. I touch bases with the patient and his daughter on all of this. MRI suggested osteomyelitis in the distal 2 cm of the fifth metatarsal 6/24; the patient was kindly seen by [name] of infectious disease by telehealth visit. He has established a PICC line and is on a Carbapenem for 4  weeks although I am not really sure which 1. Initial culture was Enterobacter I believe this was a swab. He is also followed up with [name]. He again is considering an anterograde anterior tibial attempted revascularization. The patient is to call if they agree to move forward with the anterograde attempt. He started an IV antibiotics already. He arrives in clinic today with again the wound deteriorating. There is exposed bone widely which is quite a bit worse than last week. I did obtain a piece of this for culture but more bone debridement is likely going to need to be done. I had an extensive discussion with the patient and his daughter. The problem is the ray amputation success depends largely on blood flow. [name] did not sound optimistic about this. However I would definitely go ahead with the retrograde attempt by [name] even though there are potential risks. I explained this to the patient. If we are going to have any attempt to salvage this man's foot we are going to need blood flow and I would go ahead with this. IV antibiotics in the meantime and consideration of hyperbaric oxygen all of these things  I discussed. The option would be a below-knee amputation which the patient does not seem ready for his daughter is certainly not ready for. But we also talked about this 6/30; bone culture I did last week showed the same Enterobacter cloacae I as previous. Also rare Staph epidermidis. I am not really sure of the significance of this which may be a contamination. He has a anterior grade access attempt by [name] of vein and vascular on 7/7. After we see the result of this I will go over his options which are continued antibiotics and hyperbarics versus an attempt at a ray amputation if [name] is successful in establishing more blood flow here. 7/12 the patient saw [name] yesterday who extended his IV antibiotics. He also had an attempt at left anterior tibial artery retrograde  revascularization by [name] however this was not successful. We are therefore looking at the same blood flow that we had previously. I had an extensive discussion with him today. I think he has a good chance that healing ray amputation. The other option is to continue his IV antibiotics and initiate hyperbaric oxygen therapy. The patient seems to be opting for the latter 7/19; continues on IV antibiotics per the patient until August 3. He is going to wait to [facility] sometime after that. He is approved for HBO but I do not think we will be able to get that going until sometime in the August 20 21st range per he has been using silver alginate 7/26; continues on IV antibiotics as directed by [name]. This will apparently end on August 3 and then the patient thinks he will go on oral antibiotics after that. The patient is leaving on vacation from 8/10 through 8/17. There is no point in putting him through hyperbarics at this point. His wound has improved he has been using silver alginate at 1 point about 33% of this was exposed bone all of this is covered by granulation now. 8/18; patient was away on vacation therefore we did not initiate HBO. He has completed IV antibiotics as well as 2 weeks of oral Septra as directed by Dr. Megan Salon. He is follows up tomorrow and expects to be discharged. He has been using silver alginate. The wound measures smaller 9/1 small wound on the left lateral fifth met head. Still some undermining. Somewhat friable mucosa but no overt infection this does not probe to bone there is no surrounding erythema. We have been using silver collagen. He saw Dr. Megan Salon in follow-up for his underlying osteomyelitis.Marland Kitchen He has completed 6 weeks of IV ertapenem and then 2 weeks of oral trimethoprim/sulfamethoxazole. He is now off antibiotics 9/8. He comes in today with a remarkable deterioration. His daughter is able to show pictures this had deteriorated a lot by Monday.  When they took off the dressing today there look like serosanguineous drainage and bleeding. There is easily probing bone. He was treated with IV antibiotics and then a course of oral antibiotics which she completed in early August I believe. He had been doing well. He also has known PAD status post attempts at revascularization by [name] which we are not successful in the anterior tibial artery either anterograde or retrograde. According my records he had a peroneal artery that fed the anterior tibial artery by collaterals.. As mentioned they were not successful in revascularizing anterior tibial artery. Objective Constitutional Sitting or standing Blood Pressure is within target range for patient.. Pulse regular and within target range  for patient.Marland Kitchen Respirations regular, non-labored and within target range.. Temperature is normal and within the target range for the patient.Marland Kitchen Appears in no distress. Vitals Time Taken: 9:16 AM, Height: 74 in, Weight: 238 lbs, BMI: 30.6, Temperature: 97.5 F, Pulse: 66 bpm, Respiratory Rate: 20 breaths/min, Blood Pressure: 100/58 mmHg, Capillary Blood Glucose: 103 mg/dl. General Notes: Wound exam; left lateral fifth metatarsal head. Marked deterioration from last week. There is no wide areas of exposed bone surrounding necrotic tissue and erythema there is tunneling under the metatarsal which is widely exposed. I used rongeurs to remove a piece of necrotic bone for PCR culture Integumentary (Hair, Skin) Wound #1 status is Open. Original cause of wound was Gradually Appeared. The date acquired was: 06/29/2020. The wound has been in treatment 22 weeks. The wound is located on the Left,Plantar Metatarsal head fifth. The wound measures 1.4cm length x 1.3cm width x 1.3cm depth; 1.429cm^2 area and 1.858cm^3 volume. There is bone, tendon, and Fat Layer (Subcutaneous Tissue) exposed. There is no tunneling noted, however, there is undermining starting at 12:00 and ending  at 12:00 with a maximum distance of 1.9cm. There is a medium amount of serosanguineous drainage noted. The wound margin is well defined and not attached to the wound base. There is medium (34-66%) red granulation within the wound bed. There is a medium (34-66%) amount of necrotic tissue within the wound bed including Eschar and Adherent Slough. General Notes: redness and warmth noted to periwound. 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 There was a Excisional Skin/Subcutaneous Tissue/Muscle/Bone Debridement with a total area of 0.04 sq cm performed by Ricard Dillon., MD. With the following instrument(s): Rongeur Material removed includes Bone. 1 specimen was taken by a Swab and sent to the lab per facility protocol. A time out was conducted at 09:45, prior to the start of the procedure. A Moderate amount of bleeding was controlled with Pressure. The procedure was tolerated well with a pain level of 0 throughout and a pain level of 0 following the procedure. Post Debridement Measurements: 1.4cm length x 1.3cm width x 1.3cm depth; 1.858cm^3 volume. Character of Wound/Ulcer Post Debridement requires further debridement. Severity of Tissue Post Debridement is: Necrosis of bone. Post procedure Diagnosis Wound #1: Same as Pre-Procedure Plan Follow-up Appointments: Return Appointment in 1 week. - Dr. Dellia Nims Referral back to infectious disease, [facility]. Call  their offices if you have not heard from them by Mid morning tomorrow as the referrals are sent a urgent. Start taking the oral antibiotics today. [facility] should ship the rest of the oral antibiotics to you today. ***If fever, chills, nausea, vomiting, redness increases up the foot and leg, or increased pain go to the Emergency Department. Bathing/ Shower/ Hygiene: May shower and wash wound with soap and water. - on days changing dressing. Do not soak foot. Edema Control - Lymphedema / SCD / Other: Elevate legs to the level of the heart or above for 30 minutes daily and/or when sitting, a frequency of: - throughout the day Avoid standing for long periods of time.  Exercise regularly Off-Loading: Open toe surgical shoe to: - left foot Additional Orders / Instructions: Follow Nutritious Diet Laboratory ordered were: Areobic culture-specimen not specified - PCR culture of left lateral foot wound. Consults ordered were: Infectious Disease - ****STAT**** infectious disease referral related to left lateral foot wound worsening bone exposed, periwound edema with redness., Vascular - ****STAT***** Vein andVascular Referral back [name] related to left lateral foot wound worsening with bone exposed. ordered were: orthopedic referral - ****STAT***** [facility] Referral with Dr. Doran Durand related to exposed bone of left lateral foot wound. The following medication(s) was prescribed: Nuzyra oral 150 mg tablet 2 tablet oral daily x5 days; loading dose today 03/06/2021 starting 03/06/2021 WOUND #1: - Metatarsal head fifth Wound Laterality: Plantar, Left Cleanser: Normal Saline 1 x Per Day/30 Days Discharge Instructions: Cleanse the wound with Normal Saline prior to applying a clean dressing using gauze sponges, not tissue or cotton balls. Cleanser: Soap and Water 1 x Per Day/30 Days Discharge Instructions: May shower and wash wound with dial antibacterial soap and water prior to dressing change. Cleanser:  Wound Cleanser 1 x Per Day/30 Days Discharge Instructions: Cleanse the wound with wound cleanser prior to applying a clean dressing using gauze sponges, not tissue or cotton balls. Prim Dressing: KerraCel Ag Gelling Fiber Dressing, 2x2 in (silver alginate) 1 x Per Day/30 Days ary Discharge Instructions: Apply silver alginate to wound bed as instructed Secondary Dressing: Woven Gauze Sponge, Non-Sterile 4x4 in 1 x Per Day/30 Days Discharge Instructions: Apply over primary dressing as directed. Secondary Dressing: ABD Pad, 5x9 1 x Per Day/30 Days Discharge Instructions: Apply over primary dressing as directed. Secured With: [employer], 4.5x3.1 (in/yd) 1 x Per Day/30 Days Discharge Instructions: Secure with Kerlix as directed. Secured With: 60M Medipore H Soft Cloth Surgical T ape, 2x2 (in/yd) 1 x Per Day/30 Days Discharge Instructions: Secure dressing with tape as directed. 1. Marked deterioration from last week 2. There is now exposed bone specimen obtained for PCR culture which should be back by Monday 3. I was not prepared to leave him without antibiotics I gave him samples of Nuzyra 450 mg today and tomorrow and then 300 mg Saturday Sunday and Monday and I have written him a prescription for an additional 5 days 4. Urgent infectious disease consult with [name]. When we have that appointment I will send her a message. 5. I am also going to arrange a consultation with Dr. Doran Durand. The major question that I am asking with Dr. Doran Durand is whether we can get away with a ray amputation or whether we have to look at a below-knee amputation on the left if the patient agrees to surgery. We therefore asked [name] to become involved to comment on his vascular supply although I think I am aware that there is nothing further they can be done to improve that based on the prior to angiograms and attempted revascularization 6. We will use silver alginate change daily as the primary dressing. 7.  We warned them if this deteriorates further or becomes associated with redness spreading up his foot or he becomes systemically unwell with fever then he will need to seek more urgent medical attention. Electronic Signature(s) Signed: 03/06/2021 4:26:03 PM By: Linton Ham MD Entered By: Linton Ham on 03/06/2021 10:35:00 -------------------------------------------------------------------------------- SuperBill Details Patient Name: Date of Service: [name] [name] 03/06/2021 Medical Record Number: [id number] Patient Account Number: [id number] Date of Birth/Sex: Treating RN: 09-19-1945 (74 y.o. [name] [name] Primary Care Provider: Quintin Alto,  Eddie Dibbles Other Clinician: Referring Provider: Treating Provider/Extender: Zadie Cleverly in Treatment: 22 Diagnosis Coding ICD-10 Codes Code Description E11.621 Type 2 diabetes mellitus with foot ulcer E11.51 Type 2 diabetes mellitus with diabetic peripheral angiopathy without gangrene L97.528 Non-pressure chronic ulcer of other part of left foot with other specified severity M86.272 Subacute osteomyelitis, left ankle and foot E11.42 Type 2 diabetes mellitus with diabetic polyneuropathy Facility Procedures CPT4 Code: [id number] Description: 42706 - DEB BONE 20 SQ CM/< ICD-10 Diagnosis Description L97.528 Non-pressure chronic ulcer of other part of left foot with other specified sev Modifier: erity Quantity: 1 Physician Procedures CPT4: Description Modifier Code [id number] Debridement; bone (includes epidermis, dermis, subQ tissue, muscle and/or fascia, if performed) 1st 20 sqcm or less ICD-10 Diagnosis Description L97.528 Non-pressure chronic ulcer of other part of left foot with  other specified severity Quantity: 1 Electronic Signature(s) Signed: 03/06/2021 4:26:03 PM By: Linton Ham MD Entered By: Linton Ham on 03/06/2021 10:32:32

## 2021-03-06 NOTE — Progress Notes (Signed)
[name] [name], [name] [name] ([id number]) Visit Report for 03/06/2021 Arrival Information Details Patient Name: Date of Service: [name] [name], [name] [name] 03/06/2021 9:30 A M Medical Record Number: [id number] Patient Account Number: [id number] Date of Birth/Sex: Treating RN: 1946/06/02 (75 y.o. [name], Meta.Reding Primary Care Bonniejean Piano: Consuello Masse Other Clinician: Referring Marlow Hendrie: Treating Othell Jaime/Extender: Zadie Cleverly in Treatment: 68 Visit Information History Since Last Visit Added or deleted any medications: No Patient Arrived: Ambulatory Any new allergies or adverse reactions: No Arrival Time: 09:16 Had a fall or experienced change in No Accompanied By: self activities of daily living that may affect Transfer Assistance: None risk of falls: Patient Identification Verified: Yes Signs or symptoms of abuse/neglect since last visito No Secondary Verification Process Completed: Yes Hospitalized since last visit: No Patient Requires Transmission-Based Precautions: No Implantable device outside of the clinic excluding No Patient Has Alerts: No cellular tissue based products placed in the center since last visit: Has Dressing in Place as Prescribed: Yes Has Footwear/Offloading in Place as Prescribed: Yes Left: Wedge Shoe Pain Present Now: Yes Notes Per patient pain x1 week and states, " I feel I have regressed in my wound." Daughter shows pictures of Sunday and Monday dressing changes. MD made aware. Electronic Signature(s) Signed: 03/06/2021 5:40:25 PM By: Deon Pilling RN, BSN Entered By: Deon Pilling on 03/06/2021 09:34:34 -------------------------------------------------------------------------------- Encounter Discharge Information Details Patient Name: Date of Service: [name] [name] 03/06/2021 9:30 A M Medical Record Number: [id number] Patient Account Number: [id number] Date of Birth/Sex: Treating RN: [birth year] (75 y.o. [name] Primary Care Caydence Enck:  Consuello Masse Other Clinician: Referring Margaret Staggs: Treating Stefany Starace/Extender: Zadie Cleverly in Treatment: 22 Encounter Discharge Information Items Post Procedure Vitals Discharge Condition: Stable Temperature (F): 97.5 Ambulatory Status: Ambulatory Pulse (bpm): 66 Discharge Destination: Home Respiratory Rate (breaths/min): 20 Transportation: Private Auto Blood Pressure (mmHg): 100/58 Accompanied By: daughter Schedule Follow-up Appointment: Yes Clinical Summary of Care: Electronic Signature(s) Signed: 03/06/2021 5:40:25 PM By: Deon Pilling RN, BSN Entered By: Deon Pilling on 03/06/2021 09:53:15 -------------------------------------------------------------------------------- Lower Extremity Assessment Details Patient Name: Date of Service: [name] [name] 03/06/2021 9:30 A M Medical Record Number: [id number] Patient Account Number: [id number] Date of Birth/Sex: Treating RN: 05-08-1946 (75 y.o. [name] Primary Care Ariez Neilan: Consuello Masse Other Clinician: Referring Jenah Vanasten: Treating Mayla Biddy/Extender: Zadie Cleverly in Treatment: 22 Edema Assessment Assessed: Shirlyn Goltz: Yes] Patrice Paradise: No] Edema: [Left: N] [Right: o] Calf Left: Right: Point of Measurement: 40 cm From Medial Instep 36 cm Ankle Left: Right: Point of Measurement: 10 cm From Medial Instep 24 cm Vascular Assessment Pulses: Dorsalis Pedis Palpable: [Left:Yes] Electronic Signature(s) Signed: 03/06/2021 5:40:25 PM By: Deon Pilling RN, BSN Entered By: Deon Pilling on 03/06/2021 09:23:26 -------------------------------------------------------------------------------- Multi Wound Chart Details Patient Name: Date of Service: [name] [name] 03/06/2021 9:30 A M Medical Record Number: [id number] Patient Account Number: [id number] Date of Birth/Sex: Treating RN: 19-Oct-1945 (75 y.o. [name] Primary Care Caylyn Tedeschi: Consuello Masse Other Clinician: Referring  Camrynn Mcclintic: Treating Yaslin Kirtley/Extender: Zadie Cleverly in Treatment: 22 Vital Signs Height(in): 74 Capillary Blood Glucose(mg/dl): 103 Weight(lbs): 238 Pulse(bpm): 35 Body Mass Index(BMI): 31 Blood Pressure(mmHg): 100/58 Temperature(F): 97.5 Respiratory Rate(breaths/min): 20 Photos: [1:Left, Plantar Metatarsal head fifth N/A] [N/A:N/A] Wound Location: [1:Gradually Appeared] [N/A:N/A] Wounding Event: [1:Diabetic Wound/Ulcer of the Lower] [N/A:N/A] Primary Etiology: [1:Extremity Sleep Apnea, Arrhythmia, Congestive N/A] Comorbid History: [1:Heart Failure, Coronary Artery Disease, Type II Diabetes, Osteoarthritis, Neuropathy 06/29/2020] [N/A:N/A] Date Acquired: [1:22] [N/A:N/A] Weeks of Treatment: [1:Open] [  N/A:N/A] Wound Status: [1:1.4x1.3x1.3] [N/A:N/A] Measurements L x W x D (cm) [1:1.429] [N/A:N/A] A (cm) : rea [1:1.858] [N/A:N/A] Volume (cm) : [1:-1912.70%] [N/A:N/A] % Reduction in A [1:rea: -8747.60%] [N/A:N/A] % Reduction in Volume: [1:12] Starting Position 1 (o'clock): [1:12] Ending Position 1 (o'clock): [1:1.9] Maximum Distance 1 (cm): [1:Yes] [N/A:N/A] Undermining: [1:Grade 3] [N/A:N/A] Classification: [1:Medium] [N/A:N/A] Exudate A mount: [1:Serosanguineous] [N/A:N/A] Exudate Type: [1:red, brown] [N/A:N/A] Exudate Color: [1:Well defined, not attached] [N/A:N/A] Wound Margin: [1:Medium (34-66%)] [N/A:N/A] Granulation A mount: [1:Red] [N/A:N/A] Granulation Quality: [1:Medium (34-66%)] [N/A:N/A] Necrotic A mount: [1:Eschar, Adherent Slough] [N/A:N/A] Necrotic Tissue: [1:Fat Layer (Subcutaneous Tissue): Yes N/A] Exposed Structures: [1:Tendon: Yes Bone: Yes Fascia: No Muscle: No Joint: No Small (1-33%)] [N/A:N/A] Epithelialization: [1:Debridement - Excisional] [N/A:N/A] Debridement: Pre-procedure Verification/Time Out 09:45 [N/A:N/A] Taken: [1:Bone] [N/A:N/A] Tissue Debrided: [1:Skin/Subcutaneous] [N/A:N/A] Level: [1:Tissue/Muscle/Bone 0.04]  [N/A:N/A] Debridement A (sq cm): [1:rea Rongeur] [N/A:N/A] Instrument: [1:Swab] [N/A:N/A] Specimen: [1:1] [N/A:N/A] Number of Specimens Taken: [1:Moderate] [N/A:N/A] Bleeding: [1:Pressure] [N/A:N/A] Hemostasis Achieved: [1:0] [N/A:N/A] Procedural Pain: [1:0] [N/A:N/A] Post Procedural Pain: Debridement Treatment Response: Procedure was tolerated well [N/A:N/A] Post Debridement Measurements L x 1.4x1.3x1.3 [N/A:N/A] W x D (cm) [1:1.858] [N/A:N/A] Post Debridement Volume: (cm) [1:redness and warmth noted to] [N/A:N/A] Assessment Notes: [1:periwound. Debridement] [N/A:N/A] Treatment Notes Wound #1 (Metatarsal head fifth) Wound Laterality: Plantar, Left Cleanser Normal Saline Discharge Instruction: Cleanse the wound with Normal Saline prior to applying a clean dressing using gauze sponges, not tissue or cotton balls. Soap and Water Discharge Instruction: May shower and wash wound with dial antibacterial soap and water prior to dressing change. Wound Cleanser Discharge Instruction: Cleanse the wound with wound cleanser prior to applying a clean dressing using gauze sponges, not tissue or cotton balls. Peri-Wound Care Topical Primary Dressing KerraCel Ag Gelling Fiber Dressing, 2x2 in (silver alginate) Discharge Instruction: Apply silver alginate to wound bed as instructed Secondary Dressing Woven Gauze Sponge, Non-Sterile 4x4 in Discharge Instruction: Apply over primary dressing as directed. ABD Pad, 5x9 Discharge Instruction: Apply over primary dressing as directed. Secured With [employer], 4.5x3.1 (in/yd) Discharge Instruction: Secure with Kerlix as directed. 63M Medipore H Soft Cloth Surgical T ape, 2x2 (in/yd) Discharge Instruction: Secure dressing with tape as directed. Compression Wrap Compression Stockings Add-Ons Electronic Signature(s) Signed: 03/06/2021 4:26:03 PM By: Linton Ham MD Signed: 03/06/2021 5:40:25 PM By: Deon Pilling RN, BSN Entered By: Linton Ham on 03/06/2021 10:18:43 -------------------------------------------------------------------------------- Multi-Disciplinary Care Plan Details Patient Name: Date of Service: [name] [name] 03/06/2021 9:30 A M Medical Record Number: [id number] Patient Account Number: [id number] Date of Birth/Sex: Treating RN: March 11, 1946 (74 y.o. [name] Primary Care Kaitlinn Iversen: Consuello Masse Other Clinician: Referring Nyla Creason: Treating Treyana Sturgell/Extender: Zadie Cleverly in Treatment: [place] reviewed with physician Active Inactive Necrotic Tissue Nursing Diagnoses: Impaired tissue integrity related to necrotic/devitalized tissue Goals: Necrotic/devitalized tissue will be minimized in the wound bed Date Initiated: 03/06/2021 Target Resolution Date: 04/04/2021 Goal Status: Active Patient/caregiver will verbalize understanding of reason and process for debridement of necrotic tissue Date Initiated: 03/06/2021 Target Resolution Date: 04/04/2021 Goal Status: Active Interventions: Provide education on necrotic tissue and debridement process Treatment Activities: Apply topical anesthetic as ordered : 03/06/2021 Notes: Wound/Skin Impairment Nursing Diagnoses: Impaired tissue integrity Goals: Patient/caregiver will verbalize understanding of skin care regimen Date Initiated: 09/27/2020 Target Resolution Date: 04/10/2021 Goal Status: Active Ulcer/skin breakdown will have a volume reduction of 30% by week 4 Date Initiated: 09/27/2020 Date Inactivated: 11/18/2020 Target Resolution Date: 11/15/2020 Goal Status: Met Interventions: Assess patient/caregiver  ability to obtain necessary supplies Assess patient/caregiver ability to perform ulcer/skin care regimen upon admission and as needed Assess ulceration(s) every visit Provide education on ulcer and skin care Treatment Activities: Skin care regimen initiated : 09/27/2020 Topical wound management initiated  : 09/27/2020 Notes: 10/25/20: Wound not yet at 30% volume reduction. Been on antibiotics, Xray obtained and Vascular Referral sent. Target date extended. Electronic Signature(s) Signed: 03/06/2021 5:40:25 PM By: Deon Pilling RN, BSN Entered By: Deon Pilling on 03/06/2021 09:32:28 -------------------------------------------------------------------------------- Pain Assessment Details Patient Name: Date of Service: [name] [name] 03/06/2021 9:30 A M Medical Record Number: [id number] Patient Account Number: [id number] Date of Birth/Sex: Treating RN: 1945/12/22 (75 y.o. [name] Primary Care Jabari Swoveland: Consuello Masse Other Clinician: Referring Adewale Pucillo: Treating Earlyn Sylvan/Extender: Zadie Cleverly in Treatment: 22 Active Problems Location of Pain Severity and Description of Pain Patient Has Paino Yes Site Locations Pain Location: Pain in Ulcers Rate the pain. Current Pain Level: 5 Worst Pain Level: 10 Least Pain Level: 0 Tolerable Pain Level: 8 Pain Management and Medication Current Pain Management: Medication: No Cold Application: No Rest: No Massage: No Activity: No T.E.N.S.: No Heat Application: No Leg drop or elevation: No Is the Current Pain Management Adequate: Adequate How does your wound impact your activities of daily livingo Sleep: No Bathing: No Appetite: No Relationship With Others: No Bladder Continence: No Emotions: No Bowel Continence: No Work: No Toileting: No Drive: No Dressing: No Hobbies: No Notes Per patient pain comes and goes x1 week now. Per patient no pain in a month. Electronic Signature(s) Signed: 03/06/2021 5:40:25 PM By: Deon Pilling RN, BSN Entered By: Deon Pilling on 03/06/2021 09:21:58 -------------------------------------------------------------------------------- Patient/Caregiver Education Details Patient Name: Date of Service: [name] [name] 9/8/2022andnbsp9:30 A M Medical Record Number:  [id number] Patient Account Number: [id number] Date of Birth/Gender: Treating RN: [birth year] (75 y.o. [name] Primary Care Physician: Consuello Masse Other Clinician: Referring Physician: Treating Physician/Extender: Zadie Cleverly in Treatment: 22 Education Assessment Education Provided To: Patient Education Topics Provided Wound/Skin Impairment: Handouts: Skin Care Do's and Dont's Methods: Explain/Verbal Responses: Reinforcements needed Electronic Signature(s) Signed: 03/06/2021 5:40:25 PM By: Deon Pilling RN, BSN Entered By: Deon Pilling on 03/06/2021 09:32:54 -------------------------------------------------------------------------------- Wound Assessment Details Patient Name: Date of Service: [name] [name] 03/06/2021 9:30 A M Medical Record Number: [id number] Patient Account Number: [id number] Date of Birth/Sex: Treating RN: 06-15-1946 (75 y.o. [name] Primary Care Naylee Frankowski: Consuello Masse Other Clinician: Referring Odies Desa: Treating Azarias Chiou/Extender: Zadie Cleverly in Treatment: 22 Wound Status Wound Number: 1 Primary Diabetic Wound/Ulcer of the Lower Extremity Etiology: Wound Location: Left, Plantar Metatarsal head fifth Wound Open Wounding Event: Gradually Appeared Status: Date Acquired: 06/29/2020 Comorbid Sleep Apnea, Arrhythmia, Congestive Heart Failure, Coronary Weeks Of Treatment: 22 History: Artery Disease, Type II Diabetes, Osteoarthritis, Neuropathy Clustered Wound: No Photos Wound Measurements Length: (cm) 1.4 Width: (cm) 1.3 Depth: (cm) 1.3 Area: (cm) 1.429 Volume: (cm) 1.858 % Reduction in Area: -1912.7% % Reduction in Volume: -8747.6% Epithelialization: Small (1-33%) Tunneling: No Undermining: Yes Starting Position (o'clock): 12 Ending Position (o'clock): 12 Maximum Distance: (cm) 1.9 Wound Description Classification: Grade 3 Wound Margin: Well defined, not attached Exudate  Amount: Medium Exudate Type: Serosanguineous Exudate Color: red, brown Foul Odor After Cleansing: No Slough/Fibrino Yes Wound Bed Granulation Amount: Medium (34-66%) Exposed Structure Granulation Quality: Red Fascia Exposed: No Necrotic Amount: Medium (34-66%) Fat Layer (Subcutaneous Tissue) Exposed: Yes Necrotic Quality: Eschar, Adherent Slough Tendon Exposed: Yes Muscle Exposed: No  Joint Exposed: No Bone Exposed: Yes Assessment Notes redness and warmth noted to periwound. Treatment Notes Wound #1 (Metatarsal head fifth) Wound Laterality: Plantar, Left Cleanser Normal Saline Discharge Instruction: Cleanse the wound with Normal Saline prior to applying a clean dressing using gauze sponges, not tissue or cotton balls. Soap and Water Discharge Instruction: May shower and wash wound with dial antibacterial soap and water prior to dressing change. Wound Cleanser Discharge Instruction: Cleanse the wound with wound cleanser prior to applying a clean dressing using gauze sponges, not tissue or cotton balls. Peri-Wound Care Topical Primary Dressing KerraCel Ag Gelling Fiber Dressing, 2x2 in (silver alginate) Discharge Instruction: Apply silver alginate to wound bed as instructed Secondary Dressing Woven Gauze Sponge, Non-Sterile 4x4 in Discharge Instruction: Apply over primary dressing as directed. ABD Pad, 5x9 Discharge Instruction: Apply over primary dressing as directed. Secured With [employer], 4.5x3.1 (in/yd) Discharge Instruction: Secure with Kerlix as directed. 2M Medipore H Soft Cloth Surgical Tape, 2x2 (in/yd) Discharge Instruction: Secure dressing with tape as directed. Compression Wrap Compression Stockings Add-Ons Electronic Signature(s) Signed: 03/06/2021 5:40:25 PM By: Deon Pilling RN, BSN Entered By: Deon Pilling on 03/06/2021 09:30:56 -------------------------------------------------------------------------------- Vitals Details Patient Name: Date  of Service: [name] [name] 03/06/2021 9:30 A M Medical Record Number: [id number] Patient Account Number: [id number] Date of Birth/Sex: Treating RN: 12-31-1945 (75 y.o. [name] Primary Care Alianna Wurster: Consuello Masse Other Clinician: Referring Tessa Seaberry: Treating Lorianne Malbrough/Extender: Zadie Cleverly in Treatment: 22 Vital Signs Time Taken: 09:16 Temperature (F): 97.5 Height (in): 74 Pulse (bpm): 66 Weight (lbs): 238 Respiratory Rate (breaths/min): 20 Body Mass Index (BMI): 30.6 Blood Pressure (mmHg): 100/58 Capillary Blood Glucose (mg/dl): 103 Reference Range: 80 - 120 mg / dl Electronic Signature(s) Signed: 03/06/2021 5:40:25 PM By: Deon Pilling RN, BSN Entered By: Deon Pilling on 03/06/2021 09:21:04

## 2021-03-12 ENCOUNTER — Telehealth: Payer: Self-pay

## 2021-03-12 ENCOUNTER — Encounter: Payer: Self-pay | Admitting: Internal Medicine

## 2021-03-12 ENCOUNTER — Other Ambulatory Visit (HOSPITAL_COMMUNITY): Payer: Self-pay

## 2021-03-12 ENCOUNTER — Ambulatory Visit (INDEPENDENT_AMBULATORY_CARE_PROVIDER_SITE_OTHER): Payer: Medicare Other | Admitting: Internal Medicine

## 2021-03-12 ENCOUNTER — Other Ambulatory Visit: Payer: Self-pay

## 2021-03-12 DIAGNOSIS — M86672 Other chronic osteomyelitis, left ankle and foot: Secondary | ICD-10-CM

## 2021-03-12 DIAGNOSIS — I255 Ischemic cardiomyopathy: Secondary | ICD-10-CM | POA: Diagnosis not present

## 2021-03-12 MED ORDER — NUZYRA 150 MG PO TABS: 2.0000 | ORAL_TABLET | Freq: Every day | ORAL | 1 refills | Status: DC

## 2021-03-12 NOTE — Telephone Encounter (Signed)
[facility] Patient Advocate Encounter   Received notification from [employer] that prior authorization for [name] [name] is required.   PA submitted on 03/12/21 Key [id number] Status is pending    [facility] will continue to follow.   Ileene Patrick, [facility] Patient [facility] Phone: [phone] Fax:  [phone]

## 2021-03-12 NOTE — Progress Notes (Signed)
[facility]  Patient Active Problem List   Diagnosis Date Noted   Chronic osteomyelitis of left foot ([place]) 01/29/2021    Priority: High   Erectile dysfunction due to arterial insufficiency 06/25/2020   Benign prostatic hyperplasia 05/17/2020   Weak urinary stream 05/17/2020   Atrial fibrillation ([place])    Pseudoclaudication 08/30/2017   Abnormal EKG 08/10/2017   Peripheral arterial disease ([place]) 04/21/2017   Oxygen desaturation [id number]   Chronic systolic heart failure ([place]) 05/27/2016   Obesity (BMI 30-39.9) 02/16/2016   Non-ischemic cardiomyopathy ([place]) 12/24/2015   Medication management 12/24/2015   SOB (shortness of breath) 12/24/2015   LV dysfunction    Acute systolic congestive heart failure, NYHA class 3 ([place]) 09/12/2015   OSA (obstructive sleep apnea) 04/17/2015   Spinal stenosis of lumbar region 09/12/2014   Spinal stenosis at L4-L5 level 09/12/2014   Dyspnea 10/13/2013   Fatigue 10/13/2013   Hypotension 08/09/2013   Chest pain 08/09/2013   S/P CABG x 3: (LIMA-LAD, SVG-PD, SVG-PL) 08/04/2011   Coronary artery disease involving native coronary artery of native heart without angina pectoris 07/06/2011   Vasovagal syncope 06/03/2011   Dehydration 06/02/2011   Ischemic cardiomyopathy 06/02/2011   DM2 (diabetes mellitus, type 2) ([place]) 06/02/2011   Essential hypertension 06/02/2011    Patient's Medications  New Prescriptions   No medications on file  Previous Medications   ASPIRIN EC 81 MG TABLET    Take 81 mg by mouth at bedtime.    CARVEDILOL (COREG) 6.25 MG TABLET    Take 1 tablet (6.25 mg total) by mouth 2 (two) times daily.   ELIQUIS 5 MG TABS TABLET    Take 1 tablet by mouth twice daily   EMPAGLIFLOZIN (JARDIANCE) 10 MG TABS TABLET    Take 1 tablet (10 mg total) by mouth daily before breakfast.   FUROSEMIDE (LASIX) 40 MG TABLET    Take 40 mg by mouth in the morning.   GABAPENTIN (NEURONTIN) 300 MG CAPSULE    Take 600 mg  by mouth at bedtime.   GLIPIZIDE (GLUCOTROL XL) 10 MG 24 HR TABLET    Take 10 mg by mouth at bedtime.   METFORMIN (GLUCOPHAGE-XR) 500 MG 24 HR TABLET    Take 1,000 mg by mouth 2 (two) times daily.   ONETOUCH VERIO TEST STRIP    1 each daily.   ROSUVASTATIN (CRESTOR) 20 MG TABLET    Take 10 mg by mouth every other day. In the evening  Modified Medications   Modified Medication Previous Medication   NUZYRA 150 MG TABS NUZYRA 150 MG TABS      Take 2 tablets by mouth daily.    Take 2 tablets by mouth daily.  Discontinued Medications   SULFAMETHOXAZOLE-TRIMETHOPRIM (BACTRIM DS) 800-160 MG TABLET    Take 1 tablet by mouth 2 (two) times daily.    Subjective: Mr. Stlouis is in for a work in visit.  He is accompanied by his wife he was seen by my partner, Dr. Carlyle Basques in June for left fifth metatarsal head osteomyelitis complicating an open ulcer where a callus had been debrided.  A recent MRI confirmed osteomyelitis.  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  He completed 6 weeks of IV ertapenem before receiving a 2-week course of oral trimethoprim/sulfamethoxazole which he completed on 02/12/2021.Marland Kitchen  Last week he had sudden onset of increased pain and drainage from his left lateral foot ulcer.  He was  seen by Dr. Dellia Nims at the wound center who noted marked deterioration in the wound with exposed bone, necrotic tissue, eschar and slough.  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  He was started on omadacycline on 03/06/2021.  Within 24 hours he noted improvement in the pain and decreased drainage.  He has had some very mild nausea since starting on minocycline but no abdominal pain, vomiting, diarrhea or fever.  Review of Systems: Review of Systems  Constitutional:  Negative for fever.  Gastrointestinal:  Positive for nausea. Negative for abdominal pain, diarrhea and vomiting.  Musculoskeletal:  Positive  for joint pain.   Past Medical History:  Diagnosis Date   Arthritis    Knee L - probably   Atrial fibrillation (HCC)    BPH (benign prostatic hyperplasia)    CAD (coronary artery disease) 07/06/2011   CHF (congestive heart failure) (HCC)    Coronary artery disease    Diabetes mellitus    Frequency of urination    Heart failure (HCC)    Heart murmur    High cholesterol    History of nuclear stress test 09/2010   dipyridamole; mild perfusion defect due to attenuation with mild superimposed ischemia in apical septal, apical, apical inferior & apical lateral regions; rest LV enlarged in size; prominent gut uptake in infero-apical region; no significant ischemia demonstrated; low risk scan    HNP (herniated nucleus pulposus), lumbar    Ischemic cardiomyopathy    LV dysfunction 07/06/2011   Nocturia    Right bundle branch block    S/P CABG (coronary artery bypass graft) 08/03/2011   x3; LIMA to LAD,, SVG to PDA, SVG to posterolateral branch of RCA; Dr. Ellison Hughs   Sleep apnea    sleep study 10/2010- AHI during total sleep 32.1/hr and during REM 62.3/hr (severe sleep apnea)unable to tolerate c pap   Spinal stenosis of lumbar region    Stroke [facility])    [place]     Social History   Tobacco Use   Smoking status: Former    Types: Cigars    Quit date: 09/07/2011    Years since quitting: 9.5   Smokeless tobacco: Former    Quit date: 02/28/2012  Substance Use Topics   Alcohol use: Yes    Alcohol/week: 0.0 standard drinks    Comment: occasional   Drug use: No    Family History  Problem Relation Age of Onset   Anesthesia problems Daughter    Cancer Mother    Lung disease Father        & heart disease   Colon cancer Sister    Sudden death Maternal Grandmother     Allergies  Allergen Reactions   Entresto [Sacubitril-Valsartan] Other (See Comments)    dizziness   Sacubitril Other (See Comments)     Dizziness    Objective: Vitals:   03/12/21 0908  BP: (!) 144/74  Pulse: 62   Temp: 97.6 F (36.4 C)  TempSrc: Oral  SpO2: (!) 8%  Weight: 225 lb 6.4 oz (102.2 kg)   Body mass index is 28.94 kg/m.  Physical Exam Constitutional:      Comments: He is very pleasant and in good spirits.  Cardiovascular:     Rate and Rhythm: Normal rate.  Pulmonary:     Effort: Pulmonary effort is normal.  Skin:    Comments: He has a persistent, dime sized ulcer lateral aspect of his left foot directly over the left fifth metatarsal head.  There is no surrounding erythema.  There is no drainage on his dressing and no malodor.    Lab Results Sed Rate (mm/h)  Date Value  12/11/2020 33 (H)   CRP (mg/L)  Date Value  12/11/2020 28.2 (H)      Problem List Items Addressed This Visit       High   Chronic osteomyelitis of left foot ([place])    He has developed an early relapse of his left fifth metatarsal osteomyelitis.  This is likely due to a combination of aerobic and anaerobic bacteria.  He has had a very prompt response to omadacycline and I favor continuing it.  He is in agreement with that plan.  We will get blood work today and he will follow-up in 4 weeks.      Relevant Orders   CBC   Basic metabolic panel   C-reactive protein   Sedimentation rate     Michel Bickers, MD [facility] [place] [phone] pager   [phone] cell 03/12/2021, 9:51 AM

## 2021-03-12 NOTE — Assessment & Plan Note (Signed)
He has developed an early relapse of his left fifth metatarsal osteomyelitis.  This is likely due to a combination of aerobic and anaerobic bacteria.  He has had a very prompt response to omadacycline and I favor continuing it.  He is in agreement with that plan.  We will get blood work today and he will follow-up in 4 weeks.

## 2021-03-13 ENCOUNTER — Encounter (HOSPITAL_BASED_OUTPATIENT_CLINIC_OR_DEPARTMENT_OTHER): Payer: Medicare Other | Admitting: Internal Medicine

## 2021-03-13 ENCOUNTER — Other Ambulatory Visit: Payer: Self-pay

## 2021-03-13 ENCOUNTER — Telehealth: Payer: Self-pay

## 2021-03-13 DIAGNOSIS — E1169 Type 2 diabetes mellitus with other specified complication: Secondary | ICD-10-CM | POA: Diagnosis not present

## 2021-03-13 DIAGNOSIS — L97528 Non-pressure chronic ulcer of other part of left foot with other specified severity: Secondary | ICD-10-CM | POA: Diagnosis not present

## 2021-03-13 DIAGNOSIS — L97522 Non-pressure chronic ulcer of other part of left foot with fat layer exposed: Secondary | ICD-10-CM | POA: Diagnosis not present

## 2021-03-13 DIAGNOSIS — M86672 Other chronic osteomyelitis, left ankle and foot: Secondary | ICD-10-CM

## 2021-03-13 DIAGNOSIS — E1142 Type 2 diabetes mellitus with diabetic polyneuropathy: Secondary | ICD-10-CM | POA: Diagnosis not present

## 2021-03-13 DIAGNOSIS — E11621 Type 2 diabetes mellitus with foot ulcer: Secondary | ICD-10-CM | POA: Diagnosis not present

## 2021-03-13 DIAGNOSIS — E1151 Type 2 diabetes mellitus with diabetic peripheral angiopathy without gangrene: Secondary | ICD-10-CM | POA: Diagnosis not present

## 2021-03-13 DIAGNOSIS — M86272 Subacute osteomyelitis, left ankle and foot: Secondary | ICD-10-CM | POA: Diagnosis not present

## 2021-03-13 LAB — SEDIMENTATION RATE: Sed Rate: 11 mm/h (ref 0–20)

## 2021-03-13 LAB — CBC
HCT: 41.3 % (ref 38.5–50.0)
Hemoglobin: 13.3 g/dL (ref 13.2–17.1)
MCH: 27.8 pg (ref 27.0–33.0)
MCHC: 32.2 g/dL (ref 32.0–36.0)
MCV: 86.2 fL (ref 80.0–100.0)
MPV: 10.1 fL (ref 7.5–12.5)
Platelets: 277 10*3/uL (ref 140–400)
RBC: 4.79 10*6/uL (ref 4.20–5.80)
RDW: 13.9 % (ref 11.0–15.0)
WBC: 6.2 10*3/uL (ref 3.8–10.8)

## 2021-03-13 LAB — C-REACTIVE PROTEIN: CRP: 4.3 mg/L (ref <8.0)

## 2021-03-13 LAB — BASIC METABOLIC PANEL
BUN/Creatinine Ratio: 27 (calc) — ABNORMAL HIGH (ref 6–22)
BUN: 29 mg/dL — ABNORMAL HIGH (ref 7–25)
CO2: 29 mmol/L (ref 20–32)
Calcium: 9.3 mg/dL (ref 8.6–10.3)
Chloride: 104 mmol/L (ref 98–110)
Creat: 1.07 mg/dL (ref 0.70–1.28)
Glucose, Bld: 142 mg/dL — ABNORMAL HIGH (ref 65–99)
Potassium: 5.1 mmol/L (ref 3.5–5.3)
Sodium: 140 mmol/L (ref 135–146)

## 2021-03-13 MED ORDER — NUZYRA 150 MG PO TABS: 2.0000 | ORAL_TABLET | Freq: Every day | ORAL | 0 refills | Status: DC

## 2021-03-13 NOTE — Progress Notes (Addendum)
[name] [name], [name] [name] ([id number]) Visit Report for 03/13/2021 Arrival Information Details Patient Name: Date of Service: [name] [name], [name] [name] 03/13/2021 8:00 A M Medical Record Number: [id number] Patient Account Number: [id number] Date of Birth/Sex: Treating RN: March 28, 1946 (75 y.o. [name] [name] Primary Care Ramsie Ostrander: Consuello Masse Other Clinician: Referring Justine Cossin: Treating Thorne Wirz/Extender: Zadie Cleverly in Treatment: 23 Visit Information History Since Last Visit Added or deleted any medications: No Patient Arrived: Ambulatory Any new allergies or adverse reactions: No Arrival Time: 08:27 Had a fall or experienced change in No Accompanied By: daughter activities of daily living that may affect Transfer Assistance: None risk of falls: Patient Identification Verified: Yes Signs or symptoms of abuse/neglect since No Secondary Verification Process Completed: Yes last visito Patient Requires Transmission-Based Precautions: No Hospitalized since last visit: No Patient Has Alerts: No Implantable device outside of the clinic No excluding cellular tissue based products placed in the center since last visit: Has Dressing in Place as Prescribed: Yes Has Footwear/Offloading in Place as Yes Prescribed: Left: Surgical Shoe with Pressure Relief Insole Pain Present Now: No Electronic Signature(s) Signed: 03/13/2021 5:56:16 PM By: Baruch Gouty RN, BSN Entered By: Baruch Gouty on 03/13/2021 08:28:09 -------------------------------------------------------------------------------- Clinic Level of Care Assessment Details Patient Name: Date of Service: [name] [name] 03/13/2021 8:00 A M Medical Record Number: [id number] Patient Account Number: [id number] Date of Birth/Sex: Treating RN: [birth year] (75 y.o. [name] [name] Primary Care Camara Rosander: Consuello Masse Other Clinician: Referring Criston Chancellor: Treating Emmogene Simson/Extender: Zadie Cleverly in  Treatment: 23 Clinic Level of Care Assessment Items TOOL 4 Quantity Score X- 1 0 Use when only an EandM is performed on FOLLOW-UP visit ASSESSMENTS - Nursing Assessment / Reassessment X- 1 10 Reassessment of Co-morbidities (includes updates in patient status) X- 1 5 Reassessment of Adherence to Treatment Plan ASSESSMENTS - Wound and Skin A ssessment / Reassessment X - Simple Wound Assessment / Reassessment - one wound 1 5 '[]'$  - 0 Complex Wound Assessment / Reassessment - multiple wounds X- 1 10 Dermatologic / Skin Assessment (not related to wound area) ASSESSMENTS - Focused Assessment X- 1 5 Circumferential Edema Measurements - multi extremities X- 1 10 Nutritional Assessment / Counseling / Intervention '[]'$  - 0 Lower Extremity Assessment (monofilament, tuning fork, pulses) '[]'$  - 0 Peripheral Arterial Disease Assessment (using hand held doppler) ASSESSMENTS - Ostomy and/or Continence Assessment and Care '[]'$  - 0 Incontinence Assessment and Management '[]'$  - 0 Ostomy Care Assessment and Management (repouching, etc.) PROCESS - Coordination of Care X - Simple Patient / Family Education for ongoing care 1 15 '[]'$  - 0 Complex (extensive) Patient / Family Education for ongoing care X- 1 10 Staff obtains Programmer, systems, Records, T Results / Process Orders est '[]'$  - 0 Staff telephones HHA, Nursing Homes / Clarify orders / etc '[]'$  - 0 Routine Transfer to another Facility (non-emergent condition) '[]'$  - 0 Routine Hospital Admission (non-emergent condition) '[]'$  - 0 New Admissions / Biomedical engineer / Ordering NPWT Apligraf, etc. , '[]'$  - 0 Emergency Hospital Admission (emergent condition) X- 1 10 Simple Discharge Coordination '[]'$  - 0 Complex (extensive) Discharge Coordination PROCESS - Special Needs '[]'$  - 0 Pediatric / Minor Patient Management '[]'$  - 0 Isolation Patient Management '[]'$  - 0 Hearing / Language / Visual special needs '[]'$  - 0 Assessment of Community assistance (transportation,  D/C planning, etc.) '[]'$  - 0 Additional assistance / Altered mentation '[]'$  - 0 Support Surface(s) Assessment (bed, cushion, seat, etc.) INTERVENTIONS - Wound Cleansing / Measurement X - Simple  Wound Cleansing - one wound 1 5 '[]'$  - 0 Complex Wound Cleansing - multiple wounds X- 1 5 Wound Imaging (photographs - any number of wounds) '[]'$  - 0 Wound Tracing (instead of photographs) X- 1 5 Simple Wound Measurement - one wound '[]'$  - 0 Complex Wound Measurement - multiple wounds INTERVENTIONS - Wound Dressings X - Small Wound Dressing one or multiple wounds 1 10 '[]'$  - 0 Medium Wound Dressing one or multiple wounds '[]'$  - 0 Large Wound Dressing one or multiple wounds '[]'$  - 0 Application of Medications - topical '[]'$  - 0 Application of Medications - injection INTERVENTIONS - Miscellaneous '[]'$  - 0 External ear exam '[]'$  - 0 Specimen Collection (cultures, biopsies, blood, body fluids, etc.) '[]'$  - 0 Specimen(s) / Culture(s) sent or taken to Lab for analysis '[]'$  - 0 Patient Transfer (multiple staff / Civil Service fast streamer / Similar devices) '[]'$  - 0 Simple Staple / Suture removal (25 or less) '[]'$  - 0 Complex Staple / Suture removal (26 or more) '[]'$  - 0 Hypo / Hyperglycemic Management (close monitor of Blood Glucose) '[]'$  - 0 Ankle / Brachial Index (ABI) - do not check if billed separately X- 1 5 Vital Signs Has the patient been seen at the hospital within the last three years: Yes Total Score: 110 Level Of Care: New/Established - Level 3 Electronic Signature(s) Signed: 03/13/2021 6:28:58 PM By: Deon Pilling RN, BSN Entered By: Deon Pilling on 03/13/2021 08:54:49 -------------------------------------------------------------------------------- Lower Extremity Assessment Details Patient Name: Date of Service: [name] [name] 03/13/2021 8:00 A M Medical Record Number: [id number] Patient Account Number: [id number] Date of Birth/Sex: Treating RN: [birth year] (75 y.o. [name] [name] Primary Care Kayin Osment:  Consuello Masse Other Clinician: Referring Curtina Grills: Treating Jayne Peckenpaugh/Extender: Zadie Cleverly in Treatment: 23 Edema Assessment Assessed: Shirlyn Goltz: No] Patrice Paradise: No] Edema: [Left: N] [Right: o] Calf Left: Right: Point of Measurement: 40 cm From Medial Instep 36 cm Ankle Left: Right: Point of Measurement: 10 cm From Medial Instep 24 cm Vascular Assessment Pulses: Dorsalis Pedis Palpable: [Left:Yes] Electronic Signature(s) Signed: 03/13/2021 5:56:16 PM By: Baruch Gouty RN, BSN Entered By: Baruch Gouty on 03/13/2021 08:34:44 -------------------------------------------------------------------------------- Multi Wound Chart Details Patient Name: Date of Service: [name] [name] 03/13/2021 8:00 A M Medical Record Number: [id number] Patient Account Number: [id number] Date of Birth/Sex: Treating RN: 08/30/1945 (75 y.o. [name] [name] Primary Care Corrine Tillis: Consuello Masse Other Clinician: Referring Jayleena Stille: Treating Pelham Hennick/Extender: Zadie Cleverly in Treatment: 23 Vital Signs Height(in): 74 Capillary Blood Glucose(mg/dl): 103 Weight(lbs): 238 Pulse(bpm): 47 Body Mass Index(BMI): 31 Blood Pressure(mmHg): 127/64 Temperature(F): 98.5 Respiratory Rate(breaths/min): 18 Photos: [N/A:N/A] Left, Plantar Metatarsal head fifth N/A N/A Wound Location: Gradually Appeared N/A N/A Wounding Event: Diabetic Wound/Ulcer of the Lower N/A N/A Primary Etiology: Extremity Sleep Apnea, Arrhythmia, Congestive N/A N/A Comorbid History: Heart Failure, Coronary Artery Disease, Type II Diabetes, Osteoarthritis, Neuropathy 06/29/2020 N/A N/A Date Acquired: 23 N/A N/A Weeks of Treatment: Open N/A N/A Wound Status: 0.8x0.8x0.8 N/A N/A Measurements L x W x D (cm) 0.503 N/A N/A A (cm) : rea 0.402 N/A N/A Volume (cm) : -608.50% N/A N/A % Reduction in A rea: -1814.30% N/A N/A % Reduction in Volume: Grade 3 N/A N/A Classification: Medium N/A  N/A Exudate A mount: Serosanguineous N/A N/A Exudate Type: red, brown N/A N/A Exudate Color: Well defined, not attached N/A N/A Wound Margin: Medium (34-66%) N/A N/A Granulation A mount: Red N/A N/A Granulation Quality: Medium (34-66%) N/A N/A Necrotic A mount: Fat Layer (Subcutaneous Tissue): Yes  N/A N/A Exposed Structures: Tendon: Yes Bone: Yes Fascia: No Muscle: No Joint: No Small (1-33%) N/A N/A Epithelialization: Treatment Notes Electronic Signature(s) Signed: 03/13/2021 4:57:28 PM By: [name] Ham MD Signed: 03/13/2021 6:28:58 PM By: Deon Pilling RN, BSN Entered By: [name] [name] on 03/13/2021 09:11:19 -------------------------------------------------------------------------------- Multi-Disciplinary Care Plan Details Patient Name: Date of Service: [name] [name]. 03/13/2021 8:00 A M Medical Record Number: [id number] Patient Account Number: [id number] Date of Birth/Sex: Treating RN: 04-16-1946 (74 y.o. [name] [name] Primary Care Oluwateniola Leitch: Consuello Masse Other Clinician: Referring Darius Lundberg: Treating Brayn Eckstein/Extender: Zadie Cleverly in Treatment: 23 Multidisciplinary Care Plan reviewed with physician Active Inactive Electronic Signature(s) Signed: 04/14/2021 5:40:20 PM By: Deon Pilling RN, BSN Previous Signature: 03/13/2021 6:28:58 PM Version By: Deon Pilling RN, BSN Entered By: Deon Pilling on 04/14/2021 17:40:19 -------------------------------------------------------------------------------- Pain Assessment Details Patient Name: Date of Service: [name] [name] 03/13/2021 8:00 A M Medical Record Number: [id number] Patient Account Number: [id number] Date of Birth/Sex: Treating RN: September 10, 1945 (75 y.o. [name] [name] Primary Care Kazaria Gaertner: Consuello Masse Other Clinician: Referring Camani Sesay: Treating Jansen Sciuto/Extender: Zadie Cleverly in Treatment: 23 Active Problems Location of Pain Severity and  Description of Pain Patient Has Paino No Site Locations Rate the pain. Current Pain Level: 0 Pain Management and Medication Current Pain Management: Electronic Signature(s) Signed: 03/13/2021 5:56:16 PM By: Baruch Gouty RN, BSN Entered By: Baruch Gouty on 03/13/2021 08:29:01 -------------------------------------------------------------------------------- Patient/Caregiver Education Details Patient Name: Date of Service: [place], BA RRY G. 9/15/2022andnbsp8:00 A M Medical Record Number: [id number] Patient Account Number: [id number] Date of Birth/Gender: Treating RN: 01/08/1946 (75 y.o. [name] [name] Primary Care Physician: Consuello Masse Other Clinician: Referring Physician: Treating Physician/Extender: Zadie Cleverly in Treatment: 23 Education Assessment Education Provided To: Patient Education Topics Provided Wound/Skin Impairment: Handouts: Skin Care Do's and Dont's Methods: Explain/Verbal Responses: Reinforcements needed Electronic Signature(s) Signed: 03/13/2021 6:28:58 PM By: Deon Pilling RN, BSN Entered By: Deon Pilling on 03/13/2021 08:51:32 -------------------------------------------------------------------------------- Wound Assessment Details Patient Name: Date of Service: [name] [name] 03/13/2021 8:00 A M Medical Record Number: [id number] Patient Account Number: [id number] Date of Birth/Sex: Treating RN: May 11, 1946 (75 y.o. [name] [name] Primary Care Maleea Camilo: Consuello Masse Other Clinician: Referring Haely Leyland: Treating Doye Montilla/Extender: Zadie Cleverly in Treatment: 23 Wound Status Wound Number: 1 Primary Diabetic Wound/Ulcer of the Lower Extremity Etiology: Wound Location: Left, Plantar Metatarsal head fifth Wound Open Wounding Event: Gradually Appeared Status: Date Acquired: 06/29/2020 Comorbid Sleep Apnea, Arrhythmia, Congestive Heart Failure, Coronary Weeks Of Treatment: 23 History: Artery  Disease, Type II Diabetes, Osteoarthritis, Neuropathy Clustered Wound: No Photos Wound Measurements Length: (cm) 0.8 Width: (cm) 0.8 Depth: (cm) 0.8 Area: (cm) 0.503 Volume: (cm) 0.402 % Reduction in Area: -608.5% % Reduction in Volume: -1814.3% Epithelialization: Small (1-33%) Tunneling: No Undermining: No Wound Description Classification: Grade 3 Wound Margin: Well defined, not attached Exudate Amount: Medium Exudate Type: Serosanguineous Exudate Color: red, brown Foul Odor After Cleansing: No Slough/Fibrino Yes Wound Bed Granulation Amount: Medium (34-66%) Exposed Structure Granulation Quality: Red Fascia Exposed: No Necrotic Amount: Medium (34-66%) Fat Layer (Subcutaneous Tissue) Exposed: Yes Necrotic Quality: Adherent Slough Tendon Exposed: Yes Muscle Exposed: No Joint Exposed: No Bone Exposed: Yes Electronic Signature(s) Signed: 03/13/2021 5:56:16 PM By: Baruch Gouty RN, BSN Entered By: Baruch Gouty on 03/13/2021 08:38:04 -------------------------------------------------------------------------------- [place] Details Patient Name: Date of Service: [name] [name] 03/13/2021 8:00 A M Medical Record Number: [id number] Patient Account Number: [id number] Date of Birth/Sex: Treating RN: Oct 29, 1945 (74 y.o.  [name] [name] Primary Care Lanier Felty: Consuello Masse Other Clinician: Referring Ketsia Linebaugh: Treating Rechy Bost/Extender: Zadie Cleverly in Treatment: 23 Vital Signs Time Taken: 08:28 Temperature (F): 98.5 Height (in): 74 Pulse (bpm): 73 Source: Stated Respiratory Rate (breaths/min): 18 Weight (lbs): 238 Blood Pressure (mmHg): 127/64 Source: Stated Capillary Blood Glucose (mg/dl): 103 Body Mass Index (BMI): 30.6 Reference Range: 80 - 120 mg / dl Notes glucose per pt report this am Electronic Signature(s) Signed: 03/13/2021 5:56:16 PM By: Baruch Gouty RN, BSN Entered By: Baruch Gouty on 03/13/2021 08:31:04

## 2021-03-13 NOTE — Telephone Encounter (Signed)
Patient's daughter called after speaking with insurance company in regards to [place] prescription. States that a medical necessity form is needed for insurance to approve longer dose prescription. States that they only approve 14 days Q365 days. PA was approved for medication; RN is unsure if that documentation notes the extended need for treatment; forwarded call to Butch Penny who completed documentation. Daughter to leave a voicemail to discuss further.   [name] Madrid Lorita Officer, RN

## 2021-03-13 NOTE — Telephone Encounter (Signed)
I started on  PA it was denied so I am doing an Production designer, theatre/television/film.

## 2021-03-13 NOTE — Progress Notes (Signed)
EDREES, VALENT ([id number]) Visit Report for 03/13/2021 HPI Details Patient Name: Date of Service: [name] [name], [name] [name] 03/13/2021 8:00 A M Medical Record Number: [id number] Patient Account Number: [id number] Date of Birth/Sex: Treating RN: 1946/05/19 (75 y.o. [name] [name] Primary Care Provider: Consuello Masse Other Clinician: Referring Provider: Treating Provider/Extender: Zadie Cleverly in Treatment: 23 History of Present Illness HPI Description: ADMISSION 09/27/2020 This is a 75 year old man who lives in [place]. He apparently has had callus over the plantar fifth metatarsal head in the past for which she is followed by podiatry in [place]. They shaved down the callus and this was done in January. He states that he went to [place] in January and became aware of when he was there of an open wound in this area. He followed up with podiatry and has been soaking this twice a day with Epson salts and using Silvadene. Apparently one of the podiatrist told him he may need surgery because of bone protrusion. Past medical history includes an ischemic cardiomyopathy, type 2 diabetes with peripheral neuropathy, BPH, PVD, orthostatic hypotension, atrial fibrillation, hyperlipidemia and hypertension Arterial studies in 2018 showed a noncompressible ABI on the left. It was again noncompressible today at 1.7 although his pulses are easily palpable 4/8; patient I admitted to the clinic last week. Wound on the plantar left fifth metatarsal head which has been refractory. He also has a bit of subluxation of the bone in the head. He is a type II diabetic with peripheral neuropathy. We used silver collagen after debridement He arrives back in clinic today. He has erythema spreading into the lateral part of the fifth metatarsal head. I removed some undermining tissue from around the wound and clearly there is a connection between the wound and the lateral part of the fifth met head. A  culture was done. Area of erythema on the lateral part of the foot was marked. His wife says that has been there since Wednesday 4/15; the patient arrived last week with erythema spreading in the lateral part of the fifth metatarsal head. There was a wound connected with the plantar fifth met head original wound. I gave him empiric Augmentin. Surprisingly the culture I did was negative. We have been using silver alginate. The wound has been open since January. He arrives in clinic today with the erythema much better 4/22; patient presents today for 1 week follow-up of his fifth metatarsal head wound. Daughter is present with the patient. She states that the wound has improved significantly since 2 weeks ago. There is no longer redness to the foot and the actual wound bed is smaller. Patient overall feels well and has no complaints today. 4/29; patient presents for 1 week follow-up of his fifth metatarsal head wound. Daughter is present. Patient has been using silver alginate every other day to the wound site. He has tried to relieve pressure with a surgical shoe. He denies signs of infections. 5/6; fifth metatarsal head wound. He has been using silver alginate changed to Santyl last week which he managed to obtain for $21 with a coupon we gave him. He also has an appointment with vein and vascular. I think [name] referred him. He has noncompressible ABIs. The patient also had an x-ray and that was negative 5/13; the patient went to see [name]. Areas on the left fifth metatarsal head plantar and lateral. [name] noted that his ABI in the right was 0.99 but monophasic waveforms with a TBI at 0.32.  He wanted to go ahead with an angiogram which is booked for Thursday. I talked to the patient about this and advised him to go forward with this. 5/23; patient presents for 1 week follow-up. He has been using silver alginate to the wound bed. He has no complaints today. Denies signs of infection. He  had an arteriogram done on 5/19. 5/31; angiogram showed patent common femoral, SFA, profunda, above-knee popliteal artery. 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 He does have inline flow down the left lower extremity through the peroneal that is widely patent. The peroneal gave off collaterals distally to the anterior tibial that feels retrograde at the ankle. An attempted recanalization of the left anterior tibial artery was made but was not successful. The plan as outlined by [name] is that he will continue getting aggressive wound care and he would consider a retrograde left anterior tibial intervention if there is no improvement. Wound itself does not look as though it is making any improvement. There is still tunneling superiorly precariously close to bone 6/3; back for the obligatory first total contact cast change. The wound clearly has exposed bone. He is a type II diabetic. He has been revascularized. Plain x- ray did not show osteomyelitis he may require an MRI. After his revascularization I elected to put him into a cast to make sure that all the pressure was coming off this area 6/7; patient back for his routine visit. The wound is not doing well. The patient had a lot of discomfort since he was last here 4 days ago. No systemic illness. He will not go back in the total contact cast today. 6/14; culture I did last time showed Enterobacter. At the time there was erythema and swelling I put him on Augmentin and Bactrim empirically. The Enterobacter was sensitive to the Bactrim DS. He sees [name] tomorrow. I am looking for her recommendation about antibiotic choices and route of administration. He sees [name] of vascular surgery on 6/21. I have communicated with [name] I would like to see whether or not further revascularization will be entertained and whether [name] feels that he could  survive a foot conserving surgery if that is the route the patient wishes to go. After these next 2 consultations we may be able to discuss the route the patient wants to take either a surgical 1 or perhaps 1 that involves a prolonged course of antibiotics and hyperbaric oxygen. I touch bases with the patient and his daughter on all of this. MRI suggested osteomyelitis in the distal 2 cm of the fifth metatarsal 6/24; the patient was kindly seen by [name] of infectious disease by telehealth visit. He has established a PICC line and is on a Carbapenem for 4 weeks although I am not really sure which 1. Initial culture was Enterobacter I believe this was a swab. He is also followed up with [name]. He again is considering an anterograde anterior tibial attempted revascularization. The patient is to call if they agree to move forward with the anterograde attempt. He started an IV antibiotics already. He arrives in clinic today with again the wound deteriorating. There is exposed bone widely which is quite a bit worse than last week. I did obtain a piece of this for culture but more bone debridement is likely going to need to be done. I had an extensive discussion with the patient and his daughter.  The problem is the ray amputation success depends largely on blood flow. [name] did not sound optimistic about this. However I would definitely go ahead with the retrograde attempt by [name] even though there are potential risks. I explained this to the patient. If we are going to have any attempt to salvage this man's foot we are going to need blood flow and I would go ahead with this. IV antibiotics in the meantime and consideration of hyperbaric oxygen all of these things I discussed. The option would be a below-knee amputation which the patient does not seem ready for his daughter is certainly not ready for. But we also talked about this 6/30; bone culture I did last week showed the same  Enterobacter cloacae I as previous. Also rare Staph epidermidis. I am not really sure of the significance of this which may be a contamination. He has a anterior grade access attempt by [name] of vein and vascular on 7/7. After we see the result of this I will go over his options which are continued antibiotics and hyperbarics versus an attempt at a ray amputation if [name] is successful in establishing more blood flow here. 7/12 the patient saw [name] yesterday who extended his IV antibiotics. He also had an attempt at left anterior tibial artery retrograde revascularization by [name] however this was not successful. We are therefore looking at the same blood flow that we had previously. I had an extensive discussion with him today. I think he has a good chance that healing ray amputation. The other option is to continue his IV antibiotics and initiate hyperbaric oxygen therapy. The patient seems to be opting for the latter 7/19; continues on IV antibiotics per the patient until August 3. He is going to wait to [facility] sometime after that. He is approved for HBO but I do not think we will be able to get that going until sometime in the August 20 21st range per he has been using silver alginate 7/26; continues on IV antibiotics as directed by [name]. This will apparently end on August 3 and then the patient thinks he will go on oral antibiotics after that. The patient is leaving on vacation from 8/10 through 8/17. There is no point in putting him through hyperbarics at this point. His wound has improved he has been using silver alginate at 1 point about 33% of this was exposed bone all of this is covered by granulation now. 8/18; patient was away on vacation therefore we did not initiate HBO. He has completed IV antibiotics as well as 2 weeks of oral Septra as directed by Dr. Megan Salon. He is follows up tomorrow and expects to be discharged. He has been using silver  alginate. The wound measures smaller 9/1 small wound on the left lateral fifth met head. Still some undermining. Somewhat friable mucosa but no overt infection this does not probe to bone there is no surrounding erythema. We have been using silver collagen. He saw Dr. Megan Salon in follow-up for his underlying osteomyelitis.Marland Kitchen He has completed 6 weeks of IV ertapenem and then 2 weeks of oral trimethoprim/sulfamethoxazole. He is now off antibiotics 9/8. He comes in today with a remarkable deterioration. His daughter is able to show pictures this had deteriorated a lot by Monday. When they took off the dressing today there look like serosanguineous drainage and bleeding. There is easily probing bone. He was treated with IV antibiotics and then a course of oral antibiotics  which she completed in early August I believe. He had been doing well. He also has known PAD status post attempts at revascularization by [name] which we are not successful in the anterior tibial artery either anterograde or retrograde. According my records he had a peroneal artery that fed the anterior tibial artery by collaterals.. As mentioned they were not successful in revascularizing anterior tibial artery. 9/14; the patient was kindly seen by infectious disease Dr. Megan Salon. Noted he had developed an early relapse of the left fifth metatarsal. He felt it was a combination of aerobic and anaerobic bacteria. He continued the omadacycline/Nuzyra that I had started and will continue it for 6 weeks. He did order a CBC basic metabolic panel C-reactive protein and sedimentation rate. I appreciate his review. The bone culture that I did last time showed Staph epidermidis. Methicillin-resistant The patient sees [facility] hopefully Dr. Doran Durand on Monday. I thought it was important for the patient to discuss surgical options on the left foot although he has PAD my impression is that he could probably get through a ray amputation or if  there is a lesser procedure that Dr. Doran Durand felt might be beneficial. The patient is really quite optimistic now he has had a nice response to the [place] and the wound certainly looks better. There is no erythema and certainly less purulence. He is using silver collagen Electronic Signature(s) Signed: 03/13/2021 4:57:28 PM By: Linton Ham MD Entered By: Linton Ham on 03/13/2021 09:14:28 -------------------------------------------------------------------------------- Physical Exam Details Patient Name: Date of Service: [name] 03/13/2021 8:00 A M Medical Record Number: [id number] Patient Account Number: [id number] Date of Birth/Sex: Treating RN: Jan 05, 1946 (75 y.o. [name] [name] Primary Care Provider: Consuello Masse Other Clinician: Referring Provider: Treating Provider/Extender: Zadie Cleverly in Treatment: 23 Constitutional Sitting or standing Blood Pressure is within target range for patient.. Pulse regular and within target range for patient.Marland Kitchen Respirations regular, non-labored and within target range.. Temperature is normal and within the target range for the patient.Marland Kitchen Appears in no distress. Notes Wound exam; left lateral fifth metatarsal head. Much better than last week. Tissue looks healthy. There is no purulent drainage and no surrounding erythema however there is still exposed bone. He has not been systemically unwell. Even with measuring this with a Q-tip there is bleeding Electronic Signature(s) Signed: 03/13/2021 4:57:28 PM By: Linton Ham MD Entered By: Linton Ham on 03/13/2021 09:16:45 -------------------------------------------------------------------------------- Physician Orders Details Patient Name: Date of Service: [name] 03/13/2021 8:00 A M Medical Record Number: [id number] Patient Account Number: [id number] Date of Birth/Sex: Treating RN: June 06, 1946 (74 y.o. [name] [name] Primary Care Provider: Consuello Masse Other Clinician: Referring Provider: Treating Provider/Extender: Zadie Cleverly in Treatment: 23 Verbal / Phone Orders: No Diagnosis Coding ICD-10 Coding Code Description E11.621 Type 2 diabetes mellitus with foot ulcer E11.51 Type 2 diabetes mellitus with diabetic peripheral angiopathy without gangrene L97.528 Non-pressure chronic ulcer of other part of left foot with other specified severity M86.272 Subacute osteomyelitis, left ankle and foot E11.42 Type 2 diabetes mellitus with diabetic polyneuropathy Follow-up Appointments ppointment in 1 week. - Dr. Dellia Nims Return A Keep appointment with Dr. Doran Durand at [facility] on Monday 03/17/2021. Have their office to fax over progress note from surgeon to wound center fax # [phone]. Patient to speak with Dr. Doran Durand concerning needing additional appointment with Vein and Vascular. Bathing/ Shower/ Hygiene May shower and wash wound with soap and water. - on days changing dressing. Do not  soak foot. Edema Control - Lymphedema / SCD / Other Elevate legs to the level of the heart or above for 30 minutes daily and/or when sitting, a frequency of: - throughout the day Avoid standing for long periods of time. Exercise regularly Off-Loading Open toe surgical shoe to: - left foot Additional Orders / Instructions Follow Nutritious Diet Wound Treatment Wound #1 - Metatarsal head fifth Wound Laterality: Plantar, Left Cleanser: Normal Saline 1 x Per Day/30 Days Discharge Instructions: Cleanse the wound with Normal Saline prior to applying a clean dressing using gauze sponges, not tissue or cotton balls. Cleanser: Soap and Water 1 x Per Day/30 Days Discharge Instructions: May shower and wash wound with dial antibacterial soap and water prior to dressing change. Cleanser: Wound Cleanser 1 x Per Day/30 Days Discharge Instructions: Cleanse the wound with wound cleanser prior to applying a clean dressing using gauze sponges,  not tissue or cotton balls. Prim Dressing: KerraCel Ag Gelling Fiber Dressing, 2x2 in (silver alginate) 1 x Per Day/30 Days ary Discharge Instructions: Apply silver alginate to wound bed as instructed Secondary Dressing: Woven Gauze Sponge, Non-Sterile 4x4 in (DME) (Generic) 1 x Per Day/30 Days Discharge Instructions: Apply over primary dressing as directed. Secured With: [employer], 4.5x3.1 (in/yd) (DME) (Generic) 1 x Per Day/30 Days Discharge Instructions: Secure with Kerlix as directed. Secured With: 52M Transpore Surgical Tape, White, 2x10 (in/yd) (DME) (Generic) 1 x Per Day/30 Days Electronic Signature(s) Signed: 03/13/2021 4:57:28 PM By: Linton Ham MD Signed: 03/13/2021 6:28:58 PM By: Deon Pilling RN, BSN Entered By: Deon Pilling on 03/13/2021 09:07:19 -------------------------------------------------------------------------------- Problem List Details Patient Name: Date of Service: [name] 03/13/2021 8:00 A M Medical Record Number: [id number] Patient Account Number: [id number] Date of Birth/Sex: Treating RN: 1946-06-14 (74 y.o. [name] [name] Primary Care Provider: Consuello Masse Other Clinician: Referring Provider: Treating Provider/Extender: Zadie Cleverly in Treatment: 23 Active Problems ICD-10 Encounter Code Description Active Date MDM Diagnosis E11.621 Type 2 diabetes mellitus with foot ulcer 09/27/2020 No Yes E11.51 Type 2 diabetes mellitus with diabetic peripheral angiopathy without gangrene 11/11/2020 No Yes L97.528 Non-pressure chronic ulcer of other part of left foot with other specified 09/27/2020 No Yes severity M86.272 Subacute osteomyelitis, left ankle and foot 12/10/2020 No Yes E11.42 Type 2 diabetes mellitus with diabetic polyneuropathy 09/27/2020 No Yes Inactive Problems ICD-10 Code Description Active Date Inactive Date L03.116 Cellulitis of left lower limb 10/04/2020 10/04/2020 Resolved Problems Electronic  Signature(s) Signed: 03/13/2021 4:57:28 PM By: Linton Ham MD Entered By: Linton Ham on 03/13/2021 09:11:05 -------------------------------------------------------------------------------- Progress Note Details Patient Name: Date of Service: [name] 03/13/2021 8:00 A M Medical Record Number: [id number] Patient Account Number: [id number] Date of Birth/Sex: Treating RN: Jun 01, 1946 (75 y.o. [name] [name] Primary Care Provider: Consuello Masse Other Clinician: Referring Provider: Treating Provider/Extender: Zadie Cleverly in Treatment: 23 Subjective History of Present Illness (HPI) ADMISSION 09/27/2020 This is a 75 year old man who lives in [place]. He apparently has had callus over the plantar fifth metatarsal head in the past for which she is followed by podiatry in [place]. They shaved down the callus and this was done in January. He states that he went to [place] in January and became aware of when he was there of an open wound in this area. He followed up with podiatry and has been soaking this twice a day with Epson salts and using Silvadene. Apparently one of the podiatrist told him he may need surgery because of  bone protrusion. Past medical history includes an ischemic cardiomyopathy, type 2 diabetes with peripheral neuropathy, BPH, PVD, orthostatic hypotension, atrial fibrillation, hyperlipidemia and hypertension Arterial studies in 2018 showed a noncompressible ABI on the left. It was again noncompressible today at 1.7 although his pulses are easily palpable 4/8; patient I admitted to the clinic last week. Wound on the plantar left fifth metatarsal head which has been refractory. He also has a bit of subluxation of the bone in the head. He is a type II diabetic with peripheral neuropathy. We used silver collagen after debridement He arrives back in clinic today. He has erythema spreading into the lateral part of the fifth metatarsal  head. I removed some undermining tissue from around the wound and clearly there is a connection between the wound and the lateral part of the fifth met head. A culture was done. Area of erythema on the lateral part of the foot was marked. His wife says that has been there since Wednesday 4/15; the patient arrived last week with erythema spreading in the lateral part of the fifth metatarsal head. There was a wound connected with the plantar fifth met head original wound. I gave him empiric Augmentin. Surprisingly the culture I did was negative. We have been using silver alginate. The wound has been open since January. He arrives in clinic today with the erythema much better 4/22; patient presents today for 1 week follow-up of his fifth metatarsal head wound. Daughter is present with the patient. She states that the wound has improved significantly since 2 weeks ago. There is no longer redness to the foot and the actual wound bed is smaller. Patient overall feels well and has no complaints today. 4/29; patient presents for 1 week follow-up of his fifth metatarsal head wound. Daughter is present. Patient has been using silver alginate every other day to the wound site. He has tried to relieve pressure with a surgical shoe. He denies signs of infections. 5/6; fifth metatarsal head wound. He has been using silver alginate changed to Santyl last week which he managed to obtain for $21 with a coupon we gave him. He also has an appointment with vein and vascular. I think Dr. Heber [place] referred him. He has noncompressible ABIs. The patient also had an x-ray and that was negative 5/13; the patient went to see [name]. Areas on the left fifth metatarsal head plantar and lateral. [name] noted that his ABI in the right was 0.99 but monophasic waveforms with a TBI at 0.32. He wanted to go ahead with an angiogram which is booked for Thursday. I talked to the patient about this and advised him to go forward with  this. 5/23; patient presents for 1 week follow-up. He has been using silver alginate to the wound bed. He has no complaints today. Denies signs of infection. He had an arteriogram done on 5/19. 5/31; angiogram showed patent common femoral, SFA, profunda, above-knee popliteal artery. 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 He does have inline flow down the left lower extremity through the peroneal that is widely patent. The peroneal gave off collaterals distally to the anterior tibial that feels retrograde at the ankle. An attempted recanalization of the left anterior tibial artery was made but was not successful. The plan as outlined by [name] is that he will continue getting aggressive wound care and he would consider a retrograde left anterior tibial intervention if there is  no improvement. Wound itself does not look as though it is making any improvement. There is still tunneling superiorly precariously close to bone 6/3; back for the obligatory first total contact cast change. The wound clearly has exposed bone. He is a type II diabetic. He has been revascularized. Plain x- ray did not show osteomyelitis he may require an MRI. After his revascularization I elected to put him into a cast to make sure that all the pressure was coming off this area 6/7; patient back for his routine visit. The wound is not doing well. The patient had a lot of discomfort since he was last here 4 days ago. No systemic illness. He will not go back in the total contact cast today. 6/14; culture I did last time showed Enterobacter. At the time there was erythema and swelling I put him on Augmentin and Bactrim empirically. The Enterobacter was sensitive to the Bactrim DS. He sees [name] tomorrow. I am looking for her recommendation about antibiotic choices and route of administration. He sees [name] of vascular surgery on  6/21. I have communicated with [name] I would like to see whether or not further revascularization will be entertained and whether [name] feels that he could survive a foot conserving surgery if that is the route the patient wishes to go. After these next 2 consultations we may be able to discuss the route the patient wants to take either a surgical 1 or perhaps 1 that involves a prolonged course of antibiotics and hyperbaric oxygen. I touch bases with the patient and his daughter on all of this. MRI suggested osteomyelitis in the distal 2 cm of the fifth metatarsal 6/24; the patient was kindly seen by [name] of infectious disease by telehealth visit. He has established a PICC line and is on a Carbapenem for 4 weeks although I am not really sure which 1. Initial culture was Enterobacter I believe this was a swab. He is also followed up with [name]. He again is considering an anterograde anterior tibial attempted revascularization. The patient is to call if they agree to move forward with the anterograde attempt. He started an IV antibiotics already. He arrives in clinic today with again the wound deteriorating. There is exposed bone widely which is quite a bit worse than last week. I did obtain a piece of this for culture but more bone debridement is likely going to need to be done. I had an extensive discussion with the patient and his daughter. The problem is the ray amputation success depends largely on blood flow. [name] did not sound optimistic about this. However I would definitely go ahead with the retrograde attempt by [name] even though there are potential risks. I explained this to the patient. If we are going to have any attempt to salvage this man's foot we are going to need blood flow and I would go ahead with this. IV antibiotics in the meantime and consideration of hyperbaric oxygen all of these things I discussed. The option would be a below-knee amputation which the  patient does not seem ready for his daughter is certainly not ready for. But we also talked about this 6/30; bone culture I did last week showed the same Enterobacter cloacae I as previous. Also rare Staph epidermidis. I am not really sure of the significance of this which may be a contamination. He has a anterior grade access attempt by [name] of vein and vascular on 7/7. After we see  the result of this I will go over his options which are continued antibiotics and hyperbarics versus an attempt at a ray amputation if [name] is successful in establishing more blood flow here. 7/12 the patient saw [name] yesterday who extended his IV antibiotics. He also had an attempt at left anterior tibial artery retrograde revascularization by [name] however this was not successful. We are therefore looking at the same blood flow that we had previously. I had an extensive discussion with him today. I think he has a good chance that healing ray amputation. The other option is to continue his IV antibiotics and initiate hyperbaric oxygen therapy. The patient seems to be opting for the latter 7/19; continues on IV antibiotics per the patient until August 3. He is going to wait to [facility] sometime after that. He is approved for HBO but I do not think we will be able to get that going until sometime in the August 20 21st range per he has been using silver alginate 7/26; continues on IV antibiotics as directed by [name]. This will apparently end on August 3 and then the patient thinks he will go on oral antibiotics after that. The patient is leaving on vacation from 8/10 through 8/17. There is no point in putting him through hyperbarics at this point. His wound has improved he has been using silver alginate at 1 point about 33% of this was exposed bone all of this is covered by granulation now. 8/18; patient was away on vacation therefore we did not initiate HBO. He has completed IV  antibiotics as well as 2 weeks of oral Septra as directed by Dr. Megan Salon. He is follows up tomorrow and expects to be discharged. He has been using silver alginate. The wound measures smaller 9/1 small wound on the left lateral fifth met head. Still some undermining. Somewhat friable mucosa but no overt infection this does not probe to bone there is no surrounding erythema. We have been using silver collagen. He saw Dr. Megan Salon in follow-up for his underlying osteomyelitis.Marland Kitchen He has completed 6 weeks of IV ertapenem and then 2 weeks of oral trimethoprim/sulfamethoxazole. He is now off antibiotics 9/8. He comes in today with a remarkable deterioration. His daughter is able to show pictures this had deteriorated a lot by Monday. When they took off the dressing today there look like serosanguineous drainage and bleeding. There is easily probing bone. He was treated with IV antibiotics and then a course of oral antibiotics which she completed in early August I believe. He had been doing well. He also has known PAD status post attempts at revascularization by [name] which we are not successful in the anterior tibial artery either anterograde or retrograde. According my records he had a peroneal artery that fed the anterior tibial artery by collaterals.. As mentioned they were not successful in revascularizing anterior tibial artery. 9/14; the patient was kindly seen by infectious disease Dr. Megan Salon. Noted he had developed an early relapse of the left fifth metatarsal. He felt it was a combination of aerobic and anaerobic bacteria. He continued the omadacycline/Nuzyra that I had started and will continue it for 6 weeks. He did order a CBC basic metabolic panel C-reactive protein and sedimentation rate. I appreciate his review. The bone culture that I did last time showed Staph epidermidis. Methicillin-resistant The patient sees [facility] hopefully Dr. Doran Durand on Monday. I thought it was important for  the patient to discuss surgical options on the left  foot although he has PAD my impression is that he could probably get through a ray amputation or if there is a lesser procedure that Dr. Doran Durand felt might be beneficial. The patient is really quite optimistic now he has had a nice response to the [place] and the wound certainly looks better. There is no erythema and certainly less purulence. He is using silver collagen Objective Constitutional Sitting or standing Blood Pressure is within target range for patient.. Pulse regular and within target range for patient.Marland Kitchen Respirations regular, non-labored and within target range.. Temperature is normal and within the target range for the patient.Marland Kitchen Appears in no distress. Vitals Time Taken: 8:28 AM, Height: 74 in, Source: Stated, Weight: 238 lbs, Source: Stated, BMI: 30.6, Temperature: 98.5 F, Pulse: 73 bpm, Respiratory Rate: 18 breaths/min, Blood Pressure: 127/64 mmHg, Capillary Blood Glucose: 103 mg/dl. General Notes: glucose per pt report this am General Notes: Wound exam; left lateral fifth metatarsal head. Much better than last week. Tissue looks healthy. There is no purulent drainage and no surrounding erythema however there is still exposed bone. He has not been systemically unwell. Even with measuring this with a Q-tip there is bleeding Integumentary (Hair, Skin) Wound #1 status is Open. Original cause of wound was Gradually Appeared. The date acquired was: 06/29/2020. The wound has been in treatment 23 weeks. The wound is located on the Left,Plantar Metatarsal head fifth. The wound measures 0.8cm length x 0.8cm width x 0.8cm depth; 0.503cm^2 area and 0.402cm^3 volume. There is bone, tendon, and Fat Layer (Subcutaneous Tissue) exposed. There is no tunneling or undermining noted. There is a medium amount of serosanguineous drainage noted. The wound margin is well defined and not attached to the wound base. There is medium (34-66%) red granulation  within the wound bed. There is a medium (34-66%) amount of necrotic tissue within the wound bed including Adherent Slough. 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 - Dr. Dellia Nims Keep appointment with Dr. Doran Durand at [facility] on Monday 03/17/2021. Have their office to fax over progress note from surgeon to wound center fax # [phone]. Patient to speak with Dr. Doran Durand concerning needing additional appointment with Vein and Vascular. Bathing/ Shower/ Hygiene: May shower and wash wound with soap and water. - on days changing dressing. Do not soak foot. Edema Control - Lymphedema / SCD / Other: Elevate legs to the level of the heart or above for 30 minutes daily and/or when sitting, a frequency of: - throughout the day Avoid standing for long periods of time. Exercise regularly Off-Loading: Open toe surgical shoe to: - left foot Additional Orders / Instructions: Follow Nutritious Diet WOUND #1: - Metatarsal head fifth Wound Laterality: Plantar, Left Cleanser: Normal Saline 1 x Per Day/30 Days Discharge Instructions: Cleanse the wound with Normal Saline prior to applying a clean dressing using gauze sponges, not tissue or cotton balls. Cleanser: Soap and Water 1 x Per Day/30 Days Discharge Instructions: May shower and wash wound with dial antibacterial soap and water prior to dressing change. Cleanser: Wound Cleanser 1 x Per Day/30 Days Discharge Instructions: Cleanse the wound with wound cleanser prior to applying a clean dressing using gauze sponges, not tissue or cotton balls. Prim Dressing: KerraCel Ag Gelling Fiber  Dressing, 2x2 in  (silver alginate) 1 x Per Day/30 Days ary Discharge Instructions: Apply silver alginate to wound bed as instructed Secondary Dressing: Woven Gauze Sponge, Non-Sterile 4x4 in (DME) (Generic) 1 x Per Day/30 Days Discharge Instructions: Apply over primary dressing as directed. Secured With: [employer], 4.5x3.1 (in/yd) (DME) (Generic) 1 x Per Day/30 Days Discharge Instructions: Secure with Kerlix as directed. Secured With: 17M [employer] T ape, White, 2x10 (in/yd) (DME) (Generic) 1 x Per Day/30 Days 1. The patient is on a prolonged course of omadacycline for 6 weeks he is appears to be tolerating this well and has not been thrown in the bankruptcy as far as I can tell to pay for it. 2. Still using silver alginate here. 3. He will talk to [facility] on Monday.o Foot conserving surgery to help with this resistant problem. 4. I mentioned hyperbarics again to him today and I am going to talk to him about this again next week. If he wants to avoid a left BKA I think this is probably a good addition to his antibiotics. This is of course assuming that there is no surgery he is going to agree to and/or that Dr. Doran Durand wants to do Electronic Signature(s) Signed: 03/13/2021 4:57:28 PM By: Linton Ham MD Entered By: Linton Ham on 03/13/2021 09:18:35 -------------------------------------------------------------------------------- [place] Details Patient Name: Date of Service: [name] 03/13/2021 Medical Record Number: [id number] Patient Account Number: [id number] Date of Birth/Sex: Treating RN: [birth year] (74 y.o. [name], Meta.Reding Primary Care Provider: Consuello Masse Other Clinician: Referring Provider: Treating Provider/Extender: Zadie Cleverly in Treatment: 23 Diagnosis Coding ICD-10 Codes Code Description E11.621 Type 2 diabetes mellitus with foot ulcer E11.51 Type 2 diabetes mellitus with diabetic peripheral angiopathy without  gangrene L97.528 Non-pressure chronic ulcer of other part of left foot with other specified severity M86.272 Subacute osteomyelitis, left ankle and foot E11.42 Type 2 diabetes mellitus with diabetic polyneuropathy Facility Procedures CPT4 Code: 92010071 Description: 99213 - WOUND CARE VISIT-LEV 3 EST PT Modifier: Quantity: 1 Physician Procedures : CPT4 Code Description Modifier [id number] - WC PHYS LEVEL 4 - EST PT ICD-10 Diagnosis Description E11.621 Type 2 diabetes mellitus with foot ulcer E11.51 Type 2 diabetes mellitus with diabetic peripheral angiopathy without gangrene L97.528  Non-pressure chronic ulcer of other part of left foot with other specified severity M86.272 Subacute osteomyelitis, left ankle and foot Quantity: 1 Electronic Signature(s) Signed: 03/13/2021 4:57:28 PM By: Linton Ham MD Entered By: Linton Ham on 03/13/2021 09:19:20

## 2021-03-13 NOTE — Progress Notes (Signed)
Per Shelly Coss tech will send new rx to [facility] while appeal is pending. Insurance will only cover 14 day supply of medication.  Leatrice Jewels, RMA

## 2021-03-14 ENCOUNTER — Other Ambulatory Visit: Payer: Self-pay

## 2021-03-14 DIAGNOSIS — M86672 Other chronic osteomyelitis, left ankle and foot: Secondary | ICD-10-CM

## 2021-03-14 MED ORDER — NUZYRA 150 MG PO TABS: 2.0000 | ORAL_TABLET | Freq: Every day | ORAL | 0 refills | Status: DC

## 2021-03-17 ENCOUNTER — Telehealth: Payer: Self-pay | Admitting: *Deleted

## 2021-03-17 ENCOUNTER — Telehealth: Payer: Self-pay

## 2021-03-17 DIAGNOSIS — M869 Osteomyelitis, unspecified: Secondary | ICD-10-CM | POA: Diagnosis not present

## 2021-03-17 DIAGNOSIS — E11621 Type 2 diabetes mellitus with foot ulcer: Secondary | ICD-10-CM | POA: Diagnosis not present

## 2021-03-17 NOTE — Telephone Encounter (Signed)
   [facility] Pre-operative Risk Assessment    Patient Name: [name] [name]  DOB: 1945/10/31 MRN: [id number]  HEARTCARE STAFF:  - IMPORTANT!!!!!! Under Visit Info/Reason for Call, type in Other and utilize the format Clearance MM/DD/YY or Clearance TBD. Do not use dashes or single digits. - Please review there is not already an duplicate clearance open for this procedure. - If request is for dental extraction, please clarify the # of teeth to be extracted. - If the patient is currently at the dentist's office, call Pre-Op Callback Staff (MA/nurse) to input urgent request.  - If the patient is not currently in the dentist office, please route to the Pre-Op pool.  Request for surgical clearance:  What type of surgery is being performed? - fifth toe on Left foot  amputation   When is this surgery scheduled? TBD   What type of clearance is required (medical clearance vs. Pharmacy clearance to hold med vs. Both)?  both  Are there any medications that need to be held prior to surgery and how long? Eliquis , aspirin   Practice name and name of physician performing surgery?  [facility] ; Dr [name]  What is the office phone number? [phone]   7.   What is the office fax number? Thermal   Anesthesia type (None, local, MAC, general) ? unknown   Raiford Simmonds 03/17/2021, 3:04 PM  _________________________________________________________________   (provider comments below)

## 2021-03-17 NOTE — Telephone Encounter (Signed)
error 

## 2021-03-17 NOTE — Telephone Encounter (Signed)
Thank you Donna 

## 2021-03-17 NOTE — Telephone Encounter (Signed)
[facility] Patient Advocate Encounter  Prior Authorization for [name] [name] has been approved.     Effective dates: 02/12/21 through 03/14/22   [facility] will continue to follow.  Ileene Patrick, [facility] Patient [facility] for [facility]ious [facility] Phone: [phone] Fax:  [phone]

## 2021-03-18 NOTE — Progress Notes (Signed)
Patient's chart and most recent ECHO showing EF 35-40% reviewed with Dr Valma Cava. He states per [facility] guidelines, EF must be 40% or greater. Patient will need to be done at [place]. Claiborne Billings at [place] office made aware.

## 2021-03-19 ENCOUNTER — Other Ambulatory Visit (HOSPITAL_COMMUNITY): Payer: Self-pay | Admitting: Orthopedic Surgery

## 2021-03-19 ENCOUNTER — Other Ambulatory Visit: Payer: Self-pay | Admitting: Pharmacist

## 2021-03-19 ENCOUNTER — Other Ambulatory Visit: Payer: Self-pay

## 2021-03-19 ENCOUNTER — Encounter (HOSPITAL_COMMUNITY): Payer: Self-pay | Admitting: Orthopedic Surgery

## 2021-03-19 DIAGNOSIS — M86672 Other chronic osteomyelitis, left ankle and foot: Secondary | ICD-10-CM

## 2021-03-19 MED ORDER — NUZYRA 150 MG PO TABS: 2.0000 | ORAL_TABLET | Freq: Every day | ORAL | 0 refills | Status: DC

## 2021-03-19 NOTE — Anesthesia Preprocedure Evaluation (Addendum)
Anesthesia Evaluation  Patient identified by MRN, date of birth, ID band Patient awake    Reviewed: Allergy & Precautions, NPO status , Patient's Chart, lab work & pertinent test results  History of Anesthesia Complications Negative for: history of anesthetic complications  Airway Mallampati: II  TM Distance: >3 FB     Dental  (+) Dental Advisory Given   Pulmonary sleep apnea and Continuous Positive Airway Pressure Ventilation , former smoker,    Pulmonary exam normal        Cardiovascular hypertension, + CAD and + CABG  Normal cardiovascular exam  Echo 09/20/19: IMPRESSIONS  1. Left ventricular ejection fraction, by estimation, is 35 to 40%. The  left ventricle has moderately decreased function. The left ventricle  demonstrates global hypokinesis. The left ventricular internal cavity size  was mildly dilated. There is mild  left ventricular hypertrophy. Left ventricular diastolic parameters are  consistent with Grade III diastolic dysfunction (restrictive). Elevated  left atrial pressure.  2. Right ventricular systolic function is mildly reduced. The right  ventricular size is mildly enlarged. There is moderately elevated  pulmonary artery systolic pressure.  3. Left atrial size was mildly dilated.  4. The mitral valve is normal in structure. No evidence of mitral valve  regurgitation. No evidence of mitral stenosis.  5. The aortic valve is tricuspid. Aortic valve regurgitation is not  visualized. No aortic stenosis is present.  (Comparison 08/13/17: LVEF 40%, diffuse hypokinesis, grade 2 DD, mildly dilated LA and RV, mildly reduced RVSF)    Neuro/Psych CVA negative psych ROS   GI/Hepatic negative GI ROS, Neg liver ROS,   Endo/Other  diabetes  Renal/GU negative Renal ROS     Musculoskeletal negative musculoskeletal ROS (+)   Abdominal   Peds  Hematology negative hematology ROS (+)   Anesthesia Other  Findings   Reproductive/Obstetrics                           Anesthesia Physical Anesthesia Plan  ASA: 3  Anesthesia Plan: MAC and Regional   Post-op Pain Management:    Induction:   PONV Risk Score and Plan: Ondansetron and Propofol infusion  Airway Management Planned: Natural Airway  Additional Equipment:   Intra-op Plan:   Post-operative Plan:   Informed Consent:     Dental advisory given  Plan Discussed with: Anesthesiologist, CRNA and Surgeon  Anesthesia Plan Comments: (PAT note written 03/19/2021 by Myra Gianotti, PA-C. )      Anesthesia Quick Evaluation

## 2021-03-19 NOTE — Telephone Encounter (Signed)
Patient daughter Madelynn Done aware Elesa Hacker has been sent to [place] and per pharmacy it will take at least 2 days to get to patient's residence. [name] voiced her understanding.     Tracy, CMA

## 2021-03-19 NOTE — Progress Notes (Signed)
Spoke with pt. Given pre-op instructions.   Last dose of eliquis 03/17/2021.  On 03/19/2021 hold Jardiance, no evening glucotrol. On 03/20/21 no Jardiance and do not take metformin. Check blood sugar upon awakening. If blood sugar is less than 70 drink half glass of juice. Recheck blood sugar in 15 minutes if still less than 70 call [phone] and ask for a nurse.

## 2021-03-19 NOTE — Telephone Encounter (Addendum)
   Name: [name] [name]  DOB: [birth year]  MRN: [id number]   Primary Cardiologist: [name], MD  Chart reviewed as part of pre-operative protocol coverage. Patient was contacted 03/19/2021 in reference to pre-operative risk assessment for pending surgery as outlined below.  PRYCE FOLTS was last seen on 12/2020 by Dr. Debara Pickett with complex PMH as outlined. RCRI >11% indicating baseline high CV risk due to comorbidities. I discussed case with Dr. Ellyn Hack (DOD) in Dr. Lysbeth Penner absence. He feels toe amputation is overall low risk and it would be riskier to wait - therefore OK to proceed without additional cardiovascular testing at this time. I spoke with patient who affirms no new unstable symptoms.   Per pharmacy review, "Per office protocol, patient can hold Eliquis for 1 day prior to procedure tomorrow. Resume as soon as safely possible." The patient already began holding his Eliquis - last dose was Monday 9/19. Regarding ASA, he states he is no longer on ASA as he self-discontinued this a few weeks ago due to general stomach upset. Denies signs or symptoms of GI bleeding. Stomach symptoms improved. I will route to Dr. Debara Pickett to advise on restarting ASA post-surgery - Dr. Debara Pickett, please route your response to your nurse to call patient with recommendation as additional input is not needed from preop team at this time.  I will route this recommendation to the requesting party via Epic fax function and remove from pre-op pool. Please call with questions.  Charlie Pitter, PA-C 03/19/2021, 2:58 PM

## 2021-03-19 NOTE — Telephone Encounter (Signed)
Patient with diagnosis of afib on Eliquis for anticoagulation.    Procedure: 5th toe amputation on left foot Date of procedure: tomorrow, 9/22  CHA2DS2-VASc Score = 8  This indicates a 10.8% annual risk of stroke. The patient's score is based upon: CHF History: 1 HTN History: 1 Diabetes History: 1 Stroke History: 2 Vascular Disease History: 1 Age Score: 2 Gender Score: 0   CrCl 69mL/min using adjusted body weight Platelet count 277K  Per office protocol, patient can hold Eliquis for 1 day prior to procedure tomorrow. Resume as soon as safely possible.

## 2021-03-19 NOTE — Progress Notes (Signed)
Anesthesia Chart Review: SAME DAY WORK-UP  Case: [id number] Date/Time: 03/20/21 1030   Procedure: Left foot 5th ray amputation (Left)   Anesthesia type: Choice   Pre-op diagnosis: Left foot diabetic ulcer   Location: [facility] OR ROOM 07 / [place] OR   Surgeons: [name], MD       DISCUSSION: Patient is a 75 year old male scheduled for the above procedure. He has left foot/5th toe osteomyelitis.   History includes former smoker (quit 09/07/11; quit smokeless tobacco 02/28/12), HTN, afib (s/p DCCV 05/16/19), CAD (s/p CABG 08/03/11: LIMA-dLAD, SVG-PDA, SVG-PLA), ischemic cardiomyopathy (diagnosed 2012), HFrEF (LVEF 35-40% 08/2019), RBBB, DM2, hypercholesterolemia, PAD (unsuccessful attempt at recanalization of left ATA 11/14/20, 01/02/21), CVA (Wallenberg's Syndrome), OSA (prescribed BiPAP & 2L O2 in 07/2015), BPH, spinal surgery (L3-5 microdecompression 09/12/14).   Last visit with Dr. Debara Pickett 01/01/21. No chest pain or SOB. 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 Six month follow-up planned. Per [facility] pharmacist Fuller Canada, RPH-CPP, "Per office protocol, patient can hold Eliquis for 1 day prior to procedure tomorrow. Resume as soon as safely possible." Last Eliquis 03/17/21 per patient.  Anesthesia team to evaluate on the day of surgery. HE had CBC, ESR, BMET on 03/12/21.    VS:  BP Readings from Last 3 Encounters:  03/12/21 (!) 144/74  02/13/21 111/69  01/29/21 111/66   Pulse Readings from Last 3 Encounters:  03/12/21 62  02/13/21 64  01/29/21 66     PROVIDERS: [name] [name], MD is PCP  [name], MD is cardiologist Monica Martinez, MD is vascular surgeon Linton Ham, MD is Wound Care provider Michel Bickers, MD is ID Nicolette Bang, MD is urologist   LABS: Most recent lab results include: Lab Results  Component Value Date   WBC 6.2 03/12/2021   HGB 13.3 03/12/2021   HCT 41.3 03/12/2021   PLT 277 03/12/2021    GLUCOSE 142 (H) 03/12/2021   NA 140 03/12/2021   K 5.1 03/12/2021   CL 104 03/12/2021   CREATININE 1.07 03/12/2021   BUN 29 (H) 03/12/2021   CO2 29 03/12/2021   HGBA1C 7.0 (H) 10/17/2020    OTHER: Sleep Study 05/27/15: IMPRESSIONS - Severe obstructive sleep apnea occurred during this study (AHI = 59.5/h). - Mild central sleep apnea occurred during this study (CAI = 10.4/h). - Mild oxygen desaturation was noted during this study (Min O2 = 82.00%). - The patient snored with Loud snoring volume. - No cardiac abnormalities were noted during this study. - Clinically significant periodic limb movements did not occur during sleep. No significant associated arousals.  Overnight pulse oximetry 08/24/15-08/25/15:  Mean SpO2 91.63%. SpO2 < 89% 01:40:52. SpO2 < 88% 01:23:28. Lowest SpO2 71%. Highest SpO2 99%. - Per Fransico Him, MD, "Patient has significant nocturnal hypoxemia - please start on O2 at 2L via BiPAP at night and repeat pulse oximetry on oxygen and BiPAP"   IMAGES: MRI Left foot 12/04/20: IMPRESSION: - Findings consistent with osteomyelitis in the distal 2 cm of the fifth metatarsal. A very small focus of edema and enhancement in the lateral periphery of the base of the proximal phalanx of the little toe is also worrisome for infection. - Skin ulceration adjacent to the fifth metatarsal head. Negative for underlying abscess. No evidence of septic joint.   EKG: 01/01/21 ([facility]):  Sinus rhythm with fist degree AV block Right bundle branch block Left anterior fascicular block Bifascicular block   CV: Echo 09/20/19:  IMPRESSIONS   1. Left ventricular ejection fraction, by estimation, is 35 to 40%. The  left ventricle has moderately decreased function. The left ventricle  demonstrates global hypokinesis. The left ventricular internal cavity size  was mildly dilated. There is mild  left ventricular hypertrophy. Left ventricular diastolic parameters are  consistent  with Grade III diastolic dysfunction (restrictive). Elevated  left atrial pressure.   2. Right ventricular systolic function is mildly reduced. The right  ventricular size is mildly enlarged. There is moderately elevated  pulmonary artery systolic pressure.   3. Left atrial size was mildly dilated.   4. The mitral valve is normal in structure. No evidence of mitral valve  regurgitation. No evidence of mitral stenosis.   5. The aortic valve is tricuspid. Aortic valve regurgitation is not  visualized. No aortic stenosis is present.  (Comparison 08/13/17: LVEF 40%, diffuse hypokinesis, grade 2 DD, mildly dilated LA and RV, mildly reduced RVSF)   48 Hour Holter monitor 08/17/17: Sinus rhythm with PAC's and PVC's. No a-fib or flutter noted.   Cardiac cath 09/13/15: SVG was injected is moderate in size. numerous valves The graft exhibits minimal luminal irregularities. SVG was injected is small, and is anatomically normal. Prox RCA lesion, 85% stenosed. Prox LAD lesion, 100% stenosed. Pedicle LIMA was injected is moderate in size, and is anatomically normal. There is competitive flow. Mid Cx to Dist Cx lesion, 30% stenosed. Post Atrio lesion, 100% stenosed. Ost LM to LM lesion, 30% stenosed.   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 Patent LIMA-LAD, SVG-PDA and SVG-PLB grafts with TIMI III flow and good distal runoff. LVEDP = 16 mmHg. Moderately reduced CO by Fick of 4.8 L/min and CI of 2 L/min, RA of 6 and PA mean of 29 mmHg. Compensated right and left heart pressures. LVEF 30-35% with global hypokinesis and inferior akinesis.    US Carotid 07/30/11: Summary:  Right: moderate mixed plaque origin ICA. Left: mild mixed  plaque origin ICA. Bilateral: no ICA stenosis. Vertebral  artery flow is antegrade.      Past Medical History:  Diagnosis Date   Arthritis    Knee L - probably   Atrial fibrillation (HCC)    BPH (benign prostatic hyperplasia)     CAD (coronary artery disease) 07/06/2011   CHF (congestive heart failure) (HCC)    Coronary artery disease    Diabetes mellitus    Frequency of urination    Heart failure (HCC)    High cholesterol    History of nuclear stress test 09/2010   dipyridamole; mild perfusion defect due to attenuation with mild superimposed ischemia in apical septal, apical, apical inferior & apical lateral regions; rest LV enlarged in size; prominent gut uptake in infero-apical region; no significant ischemia demonstrated; low risk scan    HNP (herniated nucleus pulposus), lumbar    Hypertension    Ischemic cardiomyopathy    LV dysfunction 07/06/2011   Nocturia    Right bundle branch block    S/P CABG (coronary artery bypass graft) 08/03/2011   x3; LIMA to LAD,, SVG to PDA, SVG to posterolateral branch of RCA; Dr. Ellison Hughs   Sleep apnea    sleep study 10/2010- AHI during total sleep 32.1/hr and during REM 62.3/hr (severe sleep apnea)unable to tolerate c pap   Spinal stenosis of lumbar region    Stroke [facility])    Wallenberg     Past Surgical History:  Procedure Laterality Date   ABDOMINAL AORTOGRAM W/LOWER  EXTREMITY Left 11/14/2020   Procedure: ABDOMINAL AORTOGRAM W/LOWER EXTREMITY;  Surgeon: [name], MD;  Location: [place] CV LAB;  Service: Cardiovascular;  Laterality: Left;   ABDOMINAL AORTOGRAM W/LOWER EXTREMITY N/A 01/02/2021   Procedure: ABDOMINAL AORTOGRAM W/LOWER EXTREMITY;  Surgeon: [name], MD;  Location: [place] CV LAB;  Service: Cardiovascular;  Laterality: N/A;   CARDIAC CATHETERIZATION  01/2011   ischemic cardiomyopathy 30-35%, multivessel CAD (Dr. [name])    CARDIAC CATHETERIZATION  09/13/2015   Procedure: Right/Left Heart Cath and Coronary/Graft Angiography;  Surgeon: [name], MD;  Location: [place] CV LAB;  Service: Cardiovascular;;   CARDIOVERSION N/A 05/16/2019   Procedure: CARDIOVERSION;  Surgeon: [name], MD;  Location: [place];   Service: Cardiovascular;  Laterality: N/A;   Colonscopy     CORONARY ARTERY BYPASS GRAFT  08/03/2011   Procedure: CORONARY ARTERY BYPASS GRAFTING (CABG);  Surgeon: [name], MD;  Location: [place];  Service: Open Heart Surgery;  Laterality: N/A;  CABG times three using right saphenous vein and left mammary artery usisng endoscope.   LEFT HEART CATHETERIZATION WITH CORONARY/GRAFT ANGIOGRAM N/A 09/20/2013   Procedure: LEFT HEART CATHETERIZATION WITH [name];  Surgeon: [name], MD;  Location: [facility] CATH LAB;  Service: Cardiovascular;  Laterality: N/A;   Lower Extremity Arterial Doppler  03/16/2013   bilat ABIs demonstated normal values; R runoff - posterior tibial & anterior tibial arteries occluded; L runoff - peroneal & posterior tibial arteries occluded, anterior tibial artery appears occluded   LUMBAR LAMINECTOMY/DECOMPRESSION MICRODISCECTOMY N/A 09/12/2014   Procedure: LUMBAR DECOMPRESSION L3-L4, L4-L5, MICRODISCECTOMY L4-L5;  Surgeon: [name], MD;  Location: [facility];  Service: Orthopedics;  Laterality: N/A;   PERIPHERAL VASCULAR BALLOON ANGIOPLASTY  11/14/2020   Procedure: PERIPHERAL VASCULAR BALLOON ANGIOPLASTY;  Surgeon: [name], MD;  Location: [place] CV LAB;  Service: Cardiovascular;;   PERIPHERAL VASCULAR BALLOON ANGIOPLASTY Left 01/02/2021   Procedure: PERIPHERAL VASCULAR BALLOON ANGIOPLASTY;  Surgeon: [name], MD;  Location: [place] CV LAB;  Service: Cardiovascular;  Laterality: Left;  Failed PTA   Pilonidal Cyst removed     TRANSTHORACIC ECHOCARDIOGRAM  11/10/2012   EF 40-45%, mild LVH, mild hypokinesis of anteroseptal myocardium, grade 1 diastolic dysfunction; mild MR & calcifed mitral annulus; LA mild-mod dilated; RA mildly dilated    MEDICATIONS: No current facility-administered medications for this encounter.    carvedilol (COREG) 6.25 MG tablet   ELIQUIS 5 MG TABS tablet   empagliflozin (JARDIANCE) 10 MG TABS tablet    furosemide (LASIX) 40 MG tablet   gabapentin (NEURONTIN) 300 MG capsule   glipiZIDE (GLUCOTROL XL) 10 MG 24 hr tablet   metFORMIN (GLUCOPHAGE-XR) 500 MG 24 hr tablet   NUZYRA 150 MG TABS   rosuvastatin (CRESTOR) 20 MG tablet   ONETOUCH VERIO test strip    Myra Gianotti, PA-C Surgical Short Stay/Anesthesiology [facility] Phone [phone] [facility] Phone [phone] 03/19/2021 11:15 AM

## 2021-03-20 ENCOUNTER — Encounter (HOSPITAL_BASED_OUTPATIENT_CLINIC_OR_DEPARTMENT_OTHER): Payer: Medicare Other | Admitting: Internal Medicine

## 2021-03-20 ENCOUNTER — Ambulatory Visit (HOSPITAL_COMMUNITY)
Admission: RE | Admit: 2021-03-20 | Discharge: 2021-03-20 | Disposition: A | Discharge status: Home / Self Care | Payer: Medicare Other | Attending: Orthopedic Surgery | Admitting: Orthopedic Surgery

## 2021-03-20 ENCOUNTER — Encounter (HOSPITAL_COMMUNITY): Payer: Self-pay | Admitting: Orthopedic Surgery

## 2021-03-20 ENCOUNTER — Encounter (HOSPITAL_COMMUNITY)
Admission: RE | Disposition: A | Discharge status: Home / Self Care | Payer: Self-pay | Source: Home / Self Care | Attending: Orthopedic Surgery

## 2021-03-20 ENCOUNTER — Other Ambulatory Visit: Payer: Self-pay

## 2021-03-20 ENCOUNTER — Ambulatory Visit (HOSPITAL_COMMUNITY): Payer: Medicare Other | Admitting: Vascular Surgery

## 2021-03-20 ENCOUNTER — Other Ambulatory Visit (HOSPITAL_COMMUNITY): Payer: Self-pay

## 2021-03-20 DIAGNOSIS — M86672 Other chronic osteomyelitis, left ankle and foot: Secondary | ICD-10-CM

## 2021-03-20 DIAGNOSIS — Z888 Allergy status to other drugs, medicaments and biological substances status: Secondary | ICD-10-CM | POA: Insufficient documentation

## 2021-03-20 DIAGNOSIS — Z87891 Personal history of nicotine dependence: Secondary | ICD-10-CM | POA: Diagnosis not present

## 2021-03-20 DIAGNOSIS — Z7984 Long term (current) use of oral hypoglycemic drugs: Secondary | ICD-10-CM | POA: Insufficient documentation

## 2021-03-20 DIAGNOSIS — E1169 Type 2 diabetes mellitus with other specified complication: Secondary | ICD-10-CM | POA: Diagnosis not present

## 2021-03-20 DIAGNOSIS — M869 Osteomyelitis, unspecified: Secondary | ICD-10-CM | POA: Insufficient documentation

## 2021-03-20 DIAGNOSIS — E11621 Type 2 diabetes mellitus with foot ulcer: Secondary | ICD-10-CM | POA: Insufficient documentation

## 2021-03-20 DIAGNOSIS — Z79899 Other long term (current) drug therapy: Secondary | ICD-10-CM | POA: Insufficient documentation

## 2021-03-20 DIAGNOSIS — Z7901 Long term (current) use of anticoagulants: Secondary | ICD-10-CM | POA: Insufficient documentation

## 2021-03-20 DIAGNOSIS — L97529 Non-pressure chronic ulcer of other part of left foot with unspecified severity: Secondary | ICD-10-CM | POA: Diagnosis not present

## 2021-03-20 HISTORY — PX: AMPUTATION: SHX166

## 2021-03-20 LAB — GLUCOSE, CAPILLARY
Glucose-Capillary: 144 mg/dL — ABNORMAL HIGH (ref 70–99)
Glucose-Capillary: 120 mg/dL — ABNORMAL HIGH (ref 70–99)

## 2021-03-20 SURGERY — AMPUTATION, FOOT, RAY
Anesthesia: Monitor Anesthesia Care | Site: Foot | Laterality: Left

## 2021-03-20 MED ORDER — FENTANYL CITRATE (PF) 100 MCG/2ML IJ SOLN
INTRAMUSCULAR | Status: AC
  Administered 2021-03-20: 50 ug via INTRAVENOUS
  Filled 2021-03-20: qty 2

## 2021-03-20 MED ORDER — ORAL CARE MOUTH RINSE: 15.0000 mL | Freq: Once | OROMUCOSAL | Status: AC

## 2021-03-20 MED ORDER — CHLORHEXIDINE GLUCONATE 0.12 % MT SOLN: 15.0000 mL | Freq: Once | OROMUCOSAL | Status: AC

## 2021-03-20 MED ORDER — FENTANYL CITRATE (PF) 100 MCG/2ML IJ SOLN: 50.0000 ug | Freq: Once | INTRAMUSCULAR | Status: AC

## 2021-03-20 MED ORDER — CHLORHEXIDINE GLUCONATE 0.12 % MT SOLN
OROMUCOSAL | Status: AC
  Administered 2021-03-20: 15 mL via OROMUCOSAL
  Filled 2021-03-20: qty 15

## 2021-03-20 MED ORDER — FENTANYL CITRATE (PF) 100 MCG/2ML IJ SOLN: 25.0000 ug | INTRAMUSCULAR | Status: DC | PRN

## 2021-03-20 MED ORDER — PROPOFOL 10 MG/ML IV BOLUS
INTRAVENOUS | Status: DC | PRN
  Administered 2021-03-20: 10 mg via INTRAVENOUS

## 2021-03-20 MED ORDER — VANCOMYCIN HCL 1000 MG IV SOLR
INTRAVENOUS | Status: AC
  Filled 2021-03-20: qty 20

## 2021-03-20 MED ORDER — ACETAMINOPHEN 500 MG PO TABS: 1000.0000 mg | ORAL_TABLET | Freq: Once | ORAL | Status: AC

## 2021-03-20 MED ORDER — ACETAMINOPHEN 500 MG PO TABS
ORAL_TABLET | ORAL | Status: AC
  Administered 2021-03-20: 1000 mg via ORAL
  Filled 2021-03-20: qty 2

## 2021-03-20 MED ORDER — LACTATED RINGERS IV SOLN: INTRAVENOUS | Status: DC

## 2021-03-20 MED ORDER — SODIUM CHLORIDE 0.9 % IV SOLN: INTRAVENOUS | Status: DC

## 2021-03-20 MED ORDER — AMISULPRIDE (ANTIEMETIC) 5 MG/2ML IV SOLN: 10.0000 mg | Freq: Once | INTRAVENOUS | Status: DC | PRN

## 2021-03-20 MED ORDER — 0.9 % SODIUM CHLORIDE (POUR BTL) OPTIME
TOPICAL | Status: DC | PRN
  Administered 2021-03-20: 1000 mL

## 2021-03-20 MED ORDER — CEFAZOLIN SODIUM-DEXTROSE 2-4 GM/100ML-% IV SOLN
2.0000 g | INTRAVENOUS | Status: AC
  Administered 2021-03-20: 2 g via INTRAVENOUS

## 2021-03-20 MED ORDER — PROPOFOL 10 MG/ML IV BOLUS
INTRAVENOUS | Status: AC
  Filled 2021-03-20: qty 20

## 2021-03-20 MED ORDER — VANCOMYCIN HCL 500 MG IV SOLR
INTRAVENOUS | Status: DC | PRN
  Administered 2021-03-20: 1000 mg via TOPICAL

## 2021-03-20 MED ORDER — TRAMADOL HCL 50 MG PO TABS: 50.0000 mg | ORAL_TABLET | Freq: Four times a day (QID) | ORAL | 0 refills | Status: AC | PRN

## 2021-03-20 MED ORDER — FENTANYL CITRATE (PF) 250 MCG/5ML IJ SOLN
INTRAMUSCULAR | Status: AC
  Filled 2021-03-20: qty 5

## 2021-03-20 MED ORDER — MIDAZOLAM HCL 2 MG/2ML IJ SOLN: 1.0000 mg | Freq: Once | INTRAMUSCULAR | Status: AC

## 2021-03-20 MED ORDER — CEFAZOLIN SODIUM-DEXTROSE 2-4 GM/100ML-% IV SOLN
INTRAVENOUS | Status: AC
  Filled 2021-03-20: qty 100

## 2021-03-20 MED ORDER — PROMETHAZINE HCL 25 MG/ML IJ SOLN: 6.2500 mg | INTRAMUSCULAR | Status: DC | PRN

## 2021-03-20 MED ORDER — MIDAZOLAM HCL 2 MG/2ML IJ SOLN
INTRAMUSCULAR | Status: AC
  Administered 2021-03-20: 1 mg via INTRAVENOUS
  Filled 2021-03-20: qty 2

## 2021-03-20 MED ORDER — MIDAZOLAM HCL 2 MG/2ML IJ SOLN
INTRAMUSCULAR | Status: AC
  Filled 2021-03-20: qty 2

## 2021-03-20 MED ORDER — PROPOFOL 500 MG/50ML IV EMUL
INTRAVENOUS | Status: DC | PRN
  Administered 2021-03-20: 75 ug/kg/min via INTRAVENOUS

## 2021-03-20 SURGICAL SUPPLY — 62 items
BLADE AVERAGE 25X9 (BLADE) IMPLANT
BLADE MICRO SAGITTAL (BLADE) IMPLANT
BLADE OSC/SAG .038X5.5 CUT EDG (BLADE) IMPLANT
BLADE SURG 10 STRL SS (BLADE) ×2 IMPLANT
BLADE SURG 15 STRL LF DISP TIS (BLADE) ×1 IMPLANT
BLADE SURG 15 STRL SS (BLADE) ×1
BNDG COHESIVE 4X5 TAN ST LF (GAUZE/BANDAGES/DRESSINGS) ×2 IMPLANT
BNDG CONFORM 3 STRL LF (GAUZE/BANDAGES/DRESSINGS) IMPLANT
BNDG ELASTIC 4X5.8 VLCR STR LF (GAUZE/BANDAGES/DRESSINGS) ×2 IMPLANT
BNDG ESMARK 4X9 LF (GAUZE/BANDAGES/DRESSINGS) ×2 IMPLANT
BRUSH SCRUB EZ PLAIN DRY (MISCELLANEOUS) IMPLANT
CHLORAPREP W/TINT 26 (MISCELLANEOUS) ×2 IMPLANT
COVER BACK TABLE 60X90IN (DRAPES) ×2 IMPLANT
DECANTER SPIKE VIAL GLASS SM (MISCELLANEOUS) IMPLANT
DRAPE EXTREMITY T 121X128X90 (DISPOSABLE) ×2 IMPLANT
DRAPE OEC MINIVIEW 54X84 (DRAPES) IMPLANT
DRAPE SURG 17X23 STRL (DRAPES) IMPLANT
DRAPE U-SHAPE 47X51 STRL (DRAPES) IMPLANT
DRSG MEPITEL 4X7.2 (GAUZE/BANDAGES/DRESSINGS) ×2 IMPLANT
DRSG MEPITEL 8X12 (GAUZE/BANDAGES/DRESSINGS) ×2 IMPLANT
DRSG PAD ABDOMINAL 8X10 ST (GAUZE/BANDAGES/DRESSINGS) IMPLANT
ELECT REM PT RETURN 9FT ADLT (ELECTROSURGICAL) ×2
ELECTRODE REM PT RTRN 9FT ADLT (ELECTROSURGICAL) ×1 IMPLANT
GAUZE SPONGE 4X4 12PLY STRL (GAUZE/BANDAGES/DRESSINGS) ×2 IMPLANT
GLOVE SRG 8 PF TXTR STRL LF DI (GLOVE) ×2 IMPLANT
GLOVE SURG ENC MOIS LTX SZ8 (GLOVE) ×2 IMPLANT
GLOVE SURG LTX SZ8 (GLOVE) ×2 IMPLANT
GLOVE SURG UNDER POLY LF SZ8 (GLOVE) ×2
GOWN STRL REUS W/ TWL LRG LVL3 (GOWN DISPOSABLE) ×1 IMPLANT
GOWN STRL REUS W/ TWL XL LVL3 (GOWN DISPOSABLE) ×2 IMPLANT
GOWN STRL REUS W/TWL LRG LVL3 (GOWN DISPOSABLE) ×1
GOWN STRL REUS W/TWL XL LVL3 (GOWN DISPOSABLE) ×2
NDL SAFETY ECLIPSE 18X1.5 (NEEDLE) IMPLANT
NEEDLE HYPO 18GX1.5 SHARP (NEEDLE)
NEEDLE HYPO 25X1 1.5 SAFETY (NEEDLE) IMPLANT
NS IRRIG 1000ML POUR BTL (IV SOLUTION) ×2 IMPLANT
PACK BASIN DAY SURGERY FS (CUSTOM PROCEDURE TRAY) ×2 IMPLANT
PAD ABD 8X10 STRL (GAUZE/BANDAGES/DRESSINGS) ×2 IMPLANT
PAD CAST 4YDX4 CTTN HI CHSV (CAST SUPPLIES) ×1 IMPLANT
PADDING CAST COTTON 4X4 STRL (CAST SUPPLIES) ×1
PENCIL SMOKE EVACUATOR (MISCELLANEOUS) ×2 IMPLANT
SANITIZER HAND PURELL 535ML FO (MISCELLANEOUS) IMPLANT
SHEET MEDIUM DRAPE 40X70 STRL (DRAPES) ×2 IMPLANT
SLEEVE SCD COMPRESS KNEE MED (STOCKING) ×2 IMPLANT
SPONGE T-LAP 18X18 ~~LOC~~+RFID (SPONGE) ×2 IMPLANT
STOCKINETTE 6  STRL (DRAPES) ×1
STOCKINETTE 6 STRL (DRAPES) ×1 IMPLANT
SUCTION FRAZIER HANDLE 10FR (MISCELLANEOUS)
SUCTION TUBE FRAZIER 10FR DISP (MISCELLANEOUS) IMPLANT
SUT ETHILON 2 0 FS 18 (SUTURE) IMPLANT
SUT ETHILON 2 0 FSLX (SUTURE) IMPLANT
SUT ETHILON 3 0 PS 1 (SUTURE) IMPLANT
SUT MNCRL AB 3-0 PS2 18 (SUTURE) IMPLANT
SUT PDS AB 0 CT 36 (SUTURE) IMPLANT
SUT PDS AB 2-0 CT2 27 (SUTURE) IMPLANT
SWAB COLLECTION DEVICE MRSA (MISCELLANEOUS) IMPLANT
SYR BULB EAR ULCER 3OZ GRN STR (SYRINGE) ×2 IMPLANT
SYR CONTROL 10ML LL (SYRINGE) IMPLANT
TOWEL GREEN STERILE FF (TOWEL DISPOSABLE) ×2 IMPLANT
TRAY DSU PREP LF (CUSTOM PROCEDURE TRAY) IMPLANT
TUBE CONNECTING 20X1/4 (TUBING) IMPLANT
UNDERPAD 30X36 HEAVY ABSORB (UNDERPADS AND DIAPERS) ×2 IMPLANT

## 2021-03-20 NOTE — Anesthesia Procedure Notes (Signed)
Procedure Name: MAC Date/Time: 03/20/2021 9:25 AM Performed by: Moshe Salisbury, CRNA Pre-anesthesia Checklist: Patient identified, Emergency Drugs available, Suction available and Patient being monitored Patient Re-evaluated:Patient Re-evaluated prior to induction Oxygen Delivery Method: Nasal cannula Placement Confirmation: positive ETCO2 Dental Injury: Teeth and Oropharynx as per pre-operative assessment

## 2021-03-20 NOTE — Discharge Instructions (Addendum)
[name] Simmer, MD [facility]  Please read the following information regarding your care after surgery.  Medications  You only need a prescription for the narcotic pain medicine (ex. oxycodone, Percocet, Norco).  All of the other medicines listed below are available over the counter. ? Aleve 2 pills twice a day for the first 3 days after surgery. X acetominophen (Tylenol) 650 mg every 4-6 hours as you need for minor to moderate pain X  tramadol as prescribed for severe pain  Narcotic pain medicine (ex. oxycodone, Percocet, Vicodin) will cause constipation.  To prevent this problem, take the following medicines while you are taking any pain medicine. ? docusate sodium (Colace) 100 mg twice a day ? senna (Senokot) 2 tablets twice a day  X Restart Eliquis the day after surgery  Weight Bearing ? Bear weight when you are able on your operated leg or foot. X Bear weight only on your operated foot in the post-op shoe. ? Do not bear any weight on the operated leg or foot.  Cast / Splint / Dressing X Keep your splint, cast or dressing clean and dry.  Don't put anything (coat hanger, pencil, etc) down inside of it.  If it gets damp, use a hair dryer on the cool setting to dry it.  If it gets soaked, call the office to schedule an appointment for a cast change. ? Remove your dressing 3 days after surgery and cover the incisions with dry dressings.    After your dressing, cast or splint is removed; you may shower, but do not soak or scrub the wound.  Allow the water to run over it, and then gently pat it dry.  Swelling It is normal for you to have swelling where you had surgery.  To reduce swelling and pain, keep your toes above your nose for at least 3 days after surgery.  It may be necessary to keep your foot or leg elevated for several weeks.  If it hurts, it should be elevated.  Follow Up Call my office at [phone] when you are discharged from the hospital or surgery center to schedule an  appointment to be seen two weeks after surgery.  Call my office at [phone] if you develop a fever >101.5 F, nausea, vomiting, bleeding from the surgical site or severe pain.

## 2021-03-20 NOTE — Transfer of Care (Signed)
Immediate Anesthesia Transfer of Care Note  Patient: [name] [name]  Procedure(s) Performed: Left foot 5th ray amputation (Left: Foot)  Patient Location: PACU  Anesthesia Type:MAC combined with regional for post-op pain  Level of Consciousness: awake and patient cooperative  Airway & Oxygen Therapy: Patient Spontanous Breathing  Post-op Assessment: Report given to RN, Post -op Vital signs reviewed and stable and Patient moving all extremities  Post vital signs: Reviewed and stable  Last Vitals:  Vitals Value Taken Time  BP    Temp    Pulse 59 03/20/21 1012  Resp 16 03/20/21 1012  SpO2 97 % 03/20/21 1012  Vitals shown include unvalidated device data.  Last Pain:  Vitals:   03/20/21 0844  TempSrc:   PainSc: 0-No pain         Complications: No notable events documented.

## 2021-03-20 NOTE — Anesthesia Postprocedure Evaluation (Signed)
Anesthesia Post Note  Patient: [name] [name]  Procedure(s) Performed: Left foot 5th ray amputation (Left: Foot)     Patient location during evaluation: PACU Anesthesia Type: Regional Level of consciousness: awake and alert Pain management: pain level controlled Vital Signs Assessment: post-procedure vital signs reviewed and stable Respiratory status: spontaneous breathing and respiratory function stable Cardiovascular status: stable Postop Assessment: no apparent nausea or vomiting Anesthetic complications: no   No notable events documented.  Last Vitals:  Vitals:   03/20/21 1031 03/20/21 1042  BP:  127/61  Pulse: 63   Resp: 11 12  Temp:  36.4 C  SpO2: 100% 100%    Last Pain:  Vitals:   03/20/21 1042  TempSrc:   PainSc: 0-No pain                 Jaylynn Mcaleer DANIEL

## 2021-03-20 NOTE — H&P (Signed)
[name] [name] is an 75 y.o. male.   Chief Complaint: chronic L foot ulcer HPI: 75 year old male with a long history of left lateral forefoot ulcer.  He has had extensive wound care by podiatry.  An MRI reveals osteomyelitis of the fifth metatarsal head.  He has had 6 weeks of IV antibiotics.  He has failed nonoperative treatment to date and presents today for left fifth ray amputation.  Past Medical History:  Diagnosis Date   Arthritis    Knee L - probably   Atrial fibrillation (HCC)    BPH (benign prostatic hyperplasia)    CAD (coronary artery disease) 07/06/2011   CHF (congestive heart failure) (HCC)    Coronary artery disease    Diabetes mellitus    Frequency of urination    Heart failure (HCC)    High cholesterol    History of nuclear stress test 09/2010   dipyridamole; mild perfusion defect due to attenuation with mild superimposed ischemia in apical septal, apical, apical inferior & apical lateral regions; rest LV enlarged in size; prominent gut uptake in infero-apical region; no significant ischemia demonstrated; low risk scan    HNP (herniated nucleus pulposus), lumbar    Hypertension    Ischemic cardiomyopathy    LV dysfunction 07/06/2011   Nocturia    Right bundle branch block    S/P CABG (coronary artery bypass graft) 08/03/2011   x3; LIMA to LAD,, SVG to PDA, SVG to posterolateral branch of RCA; Dr. Ellison Hughs   Sleep apnea    sleep study 10/2010- AHI during total sleep 32.1/hr and during REM 62.3/hr (severe sleep apnea)unable to tolerate c pap   Spinal stenosis of lumbar region    Stroke [facility])    [place]     Past Surgical History:  Procedure Laterality Date   ABDOMINAL AORTOGRAM W/LOWER EXTREMITY Left 11/14/2020   Procedure: ABDOMINAL AORTOGRAM W/LOWER EXTREMITY;  Surgeon: [name], MD;  Location: [place] CV LAB;  Service: Cardiovascular;  Laterality: Left;   ABDOMINAL AORTOGRAM W/LOWER EXTREMITY N/A 01/02/2021   Procedure: ABDOMINAL AORTOGRAM  W/LOWER EXTREMITY;  Surgeon: [name], MD;  Location: [place] CV LAB;  Service: Cardiovascular;  Laterality: N/A;   CARDIAC CATHETERIZATION  01/2011   ischemic cardiomyopathy 30-35%, multivessel CAD (Dr. [name])    CARDIAC CATHETERIZATION  09/13/2015   Procedure: Right/Left Heart Cath and Coronary/Graft Angiography;  Surgeon: [name], MD;  Location: [place] [facility] LAB;  Service: Cardiovascular;;   CARDIOVERSION N/A 05/16/2019   Procedure: CARDIOVERSION;  Surgeon: [name], MD;  Location: [place];  Service: Cardiovascular;  Laterality: N/A;   Colonscopy     CORONARY ARTERY BYPASS GRAFT  08/03/2011   Procedure: CORONARY ARTERY BYPASS GRAFTING (CABG);  Surgeon: [name], MD;  Location: [place];  Service: Open Heart Surgery;  Laterality: N/A;  CABG times three using right saphenous vein and left mammary artery usisng endoscope.   LEFT HEART CATHETERIZATION WITH CORONARY/GRAFT ANGIOGRAM N/A 09/20/2013   Procedure: LEFT HEART CATHETERIZATION WITH [name];  Surgeon: [name], MD;  Location: [facility] C[facility] LAB;  Service: Cardiovascular;  Laterality: N/A;   Lower Extremity Arterial Doppler  03/16/2013   bilat ABIs demonstated normal values; R runoff - posterior tibial & anterior tibial arteries occluded; L runoff - peroneal & posterior tibial arteries occluded, anterior tibial artery appears occluded   LUMBAR LAMINECTOMY/DECOMPRESSION MICRODISCECTOMY N/A 09/12/2014   Procedure: LUMBAR DECOMPRESSION L3-L4, L4-L5, MICRODISCECTOMY L4-L5;  Surgeon: [name], MD;  Location: [facility];  Service: Orthopedics;  Laterality: N/A;   PERIPHERAL VASCULAR BALLOON ANGIOPLASTY  11/14/2020   Procedure: PERIPHERAL VASCULAR BALLOON ANGIOPLASTY;  Surgeon: [name], MD;  Location: [place] [facility] LAB;  Service: Cardiovascular;;   PERIPHERAL VASCULAR BALLOON ANGIOPLASTY Left 01/02/2021   Procedure: PERIPHERAL VASCULAR BALLOON ANGIOPLASTY;  Surgeon: [name], MD;  Location: [place] [facility] LAB;  Service: Cardiovascular;  Laterality: Left;  Failed PTA   Pilonidal Cyst removed     TRANSTHORACIC ECHOCARDIOGRAM  11/10/2012   EF 40-45%, mild LVH, mild hypokinesis of anteroseptal myocardium, grade 1 diastolic dysfunction; mild MR & calcifed mitral annulus; LA mild-mod dilated; RA mildly dilated    Family History  Problem Relation Age of Onset   Anesthesia problems Daughter    Cancer Mother    Lung disease Father        & heart disease   Colon cancer Sister    Sudden death Maternal Grandmother    Social History:  reports that he quit smoking about 9 years ago. His smoking use included cigars. He quit smokeless tobacco use about 9 years ago. He reports current alcohol use. He reports that he does not use drugs.  Allergies:  Allergies  Allergen Reactions   Entresto [Sacubitril-Valsartan] Other (See Comments)    dizziness   Sacubitril Other (See Comments)     Dizziness    Medications Prior to Admission  Medication Sig Dispense Refill   carvedilol (COREG) 6.25 MG tablet Take 1 tablet (6.25 mg total) by mouth 2 (two) times daily. 180 tablet 3   ELIQUIS 5 MG TABS tablet Take 1 tablet by mouth twice daily 180 tablet 1   empagliflozin (JARDIANCE) 10 MG TABS tablet Take 1 tablet (10 mg total) by mouth daily before breakfast. 30 tablet 11   furosemide (LASIX) 40 MG tablet Take 40 mg by mouth in the morning.     gabapentin (NEURONTIN) 300 MG capsule Take 600 mg by mouth at bedtime.     glipiZIDE (GLUCOTROL XL) 10 MG 24 hr tablet Take 10 mg by mouth at bedtime.     metFORMIN (GLUCOPHAGE-XR) 500 MG 24 hr tablet Take 1,000 mg by mouth 2 (two) times daily.     NUZYRA 150 MG TABS Take 2 tablets by mouth daily. 30 tablet 0   rosuvastatin (CRESTOR) 20 MG tablet Take 10 mg by mouth every other day. In the evening     ONETOUCH VERIO test strip 1 each daily.      Results for orders placed or performed during the hospital encounter of 03/20/21  (from the past 48 hour(s))  Glucose, capillary     Status: Abnormal   Collection Time: 03/20/21  8:32 AM  Result Value Ref Range   Glucose-Capillary 144 (H) 70 - 99 mg/dL    Comment: Glucose reference range applies only to samples taken after fasting for at least 8 hours.   No results found.  Review of Systems no recent fever, chills, nausea, vomiting or changes in his appetite  Blood pressure (!) 130/58, pulse 65, temperature 97.7 F (36.5 C), temperature source Oral, resp. rate 12, height 6\' 2"  (1.88 m), weight 101.2 kg, SpO2 100 %. Physical Exam  Well-nourished well-developed male in no apparent distress.  Alert and oriented x4.  Normal mood and affect.  Gait is flatfoot flatfoot on the left in a postop shoe.  The left foot has a 1 cm x 1 cm ulcer at the lateral forefoot adjacent to the fifth metatarsal head.  It probes  to bone.  Skin is slightly swollen and erythematous.  No lymphangiitis and lymphadenopthy.    Assessment/Plan Left foot diabetic ulcer and osteomyelitis.  To OR today for L 5th ray amputation.  The risks and benefits of the alternative treatment options have been discussed in detail.  The patient wishes to proceed with surgery and specifically understands risks of bleeding, infection, nerve damage, blood clots, need for additional surgery, amputation and death.   [name], MD April 15, 2021, 9:04 AM

## 2021-03-20 NOTE — Telephone Encounter (Signed)
Ok to proceed with surgery - will address restarting aspirin after depending on how he feels.  Dr. Lemmie Evens

## 2021-03-20 NOTE — Anesthesia Procedure Notes (Signed)
Anesthesia Regional Block: Popliteal block   Pre-Anesthetic Checklist: , timeout performed,  Correct Patient, Correct Site, Correct Laterality,  Correct Procedure, Correct Position, site marked,  Risks and benefits discussed,  Surgical consent,  Pre-op evaluation,  At surgeon's request and post-op pain management  Laterality: Left  Prep: chloraprep       Needles:  Injection technique: Single-shot  Needle Type: Echogenic Stimulator Needle          Additional Needles:   Procedures:,,,, ultrasound used (permanent image in chart),,    Narrative:  Start time: 03/20/2021 8:41 AM End time: 03/20/2021 9:51 AM Injection made incrementally with aspirations every 5 mL.  Performed by: Personally  Anesthesiologist: Duane Boston, MD  Additional Notes: A functioning IV was confirmed and monitors were applied.  Sterile prep and drape, hand hygiene and sterile gloves were used.  Negative aspiration and test dose prior to incremental administration of local anesthetic. The patient tolerated the procedure well.Ultrasound  guidance: relevant anatomy identified, needle position confirmed, local anesthetic spread visualized around nerve(s), vascular puncture avoided.  Image printed for medical record.

## 2021-03-20 NOTE — Op Note (Signed)
03/20/2021  10:09 AM  PATIENT:  [name] [name]  75 y.o. male  PRE-OPERATIVE DIAGNOSIS:  Left foot diabetic ulcer and osteomyelitis  POST-OPERATIVE DIAGNOSIS:  same  Procedure(s):  Left foot 5th ray amputation  SURGEON:  [name], MD  ASSISTANT:   ANESTHESIA:   MAC, regional  EBL:  minimal   TOURNIQUET:   Total Tourniquet Time Documented: Thigh (Left) - 3 minutes Total: Thigh (Left) - 3 minutes  Calf (Left) - 9 minutes Total: Calf (Left) - 9 minutes  COMPLICATIONS:  None apparent  DISPOSITION:  Extubated, awake and stable to recovery.  INDICATION FOR PROCEDURE:  75 y/o male with PMH of diabetes has a nonhealing diabetic ulcer of the left foot with 5th MT osteomyelitis.  He has failed non op treatment and presents today for left 5th ray amputation.  The risks and benefits of the alternative treatment options have been discussed in detail.  The patient wishes to proceed with surgery and specifically understands risks of bleeding, infection, nerve damage, blood clots, need for additional surgery, amputation and death.   PROCEDURE IN DETAIL:  After pre operative consent was obtained, and the correct operative site was identified, the patient was brought to the operating room and placed supine on the OR table.  Anesthesia was administered.  Pre-operative antibiotics were administered.  A surgical timeout was taken.  The left lower extremity was prepped and draped in sterile fashion.  A racquet incision was made at the base of the left 5th toe.  The incision was carried around the ulcer removing in completely.  The 5th MT shaft was exposed.  Subperiosteal dissection was carried proximally.  The MT was cut with the oscillating saw while protecting the surrounding soft tissue.  The toe and distal ray were passed off the field.  The remaining soft tissues appeared healthy with no residual necrosis or infection.  The cut bone was bevelled appropriately and smoothed with a rasp.  The  tourniquet was released and hemostasis achieved.  The wound was irrigated copiously and sprinkled with vancomycin powder.  The incision was closed with 2-0 nylon simple and horizontal mattress sutures.  The area of the ulcer was closed successfully.  Sterile dressing were applied followed by an ace wrap.  The patient was awakened from anesthesia and transported to the recovery room in stable condition.  FOLLOW UP PLAN:  wbat in a flat post op shoe.  F/u in the office in two weeks.   Continue abx per ID for two weeks post op.

## 2021-03-21 ENCOUNTER — Encounter (HOSPITAL_COMMUNITY): Payer: Self-pay | Admitting: Orthopedic Surgery

## 2021-03-24 ENCOUNTER — Other Ambulatory Visit (HOSPITAL_COMMUNITY): Payer: Self-pay

## 2021-03-26 ENCOUNTER — Telehealth: Payer: Self-pay

## 2021-03-26 NOTE — Telephone Encounter (Signed)
[facility] Patient Advocate Encounter  Completed and sent [facility] application for [employer] for this patient who is insured.    Patient is approved 03/26/21 through 03/25/22.  Patient is approved for a one-time assistance within 85-month enrollment period, effective as of 03/26/21-09/27/2. Elesa Hacker order will be shipped overnight to patient's home via [place].   I have spoken to patient daughter Madelynn Done [phone].  [facility] [place], CPhT Specialty Pharmacy Patient [facility] Phone: [phone] Fax:  [phone]

## 2021-03-31 ENCOUNTER — Telehealth: Payer: Self-pay

## 2021-03-31 NOTE — Telephone Encounter (Signed)
Patients daughter Madelynn Done) called stating that patient is unable to continue taking Nuzyra due to it causing patient to be unable to sleep and if patient feels like stomach is on fire after taking medication. [name] stated that patient is fasting 4 hours before taking medication. Please advise.     [place], CMA

## 2021-03-31 NOTE — Telephone Encounter (Signed)
Spoke with [name] on the phone and discussed these side effects further. Recommended to continue taking Nuzyra until Thursday at his appointment with Dr. Megan Salon. She stated that [name] [name] underwent amputation of infected bone on 9/22 with orthopedics. She stated he has been taking Nuzyra right when he wakes up then fasts for 4 hours and said she will see if it helps him to eat breakfast in the morning first then fast prior to taking Nuzyra. Also recommended taking Tums or Pepto-bismol around when he eats to see if that helps when he takes the medicine; this will also separate his Nuzyra from the cation interaction. Told her to call us back if the pain continues to get worse.  Alfonse Spruce, PharmD, CPP Clinical Pharmacist Practitioner [facility] [place] [facility]

## 2021-04-02 ENCOUNTER — Ambulatory Visit: Payer: Medicare Other | Admitting: Internal Medicine

## 2021-04-03 ENCOUNTER — Ambulatory Visit (INDEPENDENT_AMBULATORY_CARE_PROVIDER_SITE_OTHER): Payer: Medicare Other | Admitting: Internal Medicine

## 2021-04-03 ENCOUNTER — Encounter: Payer: Self-pay | Admitting: Internal Medicine

## 2021-04-03 ENCOUNTER — Other Ambulatory Visit: Payer: Self-pay

## 2021-04-03 DIAGNOSIS — I255 Ischemic cardiomyopathy: Secondary | ICD-10-CM

## 2021-04-03 DIAGNOSIS — M86672 Other chronic osteomyelitis, left ankle and foot: Secondary | ICD-10-CM

## 2021-04-03 NOTE — Progress Notes (Signed)
[facility] for Infectious Disease  Patient Active Problem List   Diagnosis Date Noted   Chronic osteomyelitis of left foot ([place]) 01/29/2021    Priority: High   Erectile dysfunction due to arterial insufficiency 06/25/2020   Benign prostatic hyperplasia 05/17/2020   Weak urinary stream 05/17/2020   Atrial fibrillation ([place])    Pseudoclaudication 08/30/2017   Abnormal EKG 08/10/2017   Peripheral arterial disease ([place]) 04/21/2017   Oxygen desaturation 35/36/1443   Chronic systolic heart failure ([place]) 05/27/2016   Obesity (BMI 30-39.9) 02/16/2016   Non-ischemic cardiomyopathy ([place]) 12/24/2015   Medication management 12/24/2015   SOB (shortness of breath) 12/24/2015   LV dysfunction    Acute systolic congestive heart failure, NYHA class 3 ([place]) 09/12/2015   OSA (obstructive sleep apnea) 04/17/2015   Spinal stenosis of lumbar region 09/12/2014   Spinal stenosis at L4-L5 level 09/12/2014   Dyspnea 10/13/2013   Fatigue 10/13/2013   Hypotension 08/09/2013   Chest pain 08/09/2013   S/P CABG x 3: (LIMA-LAD, SVG-PD, SVG-PL) 08/04/2011   Coronary artery disease involving native coronary artery of native heart without angina pectoris 07/06/2011   Vasovagal syncope 06/03/2011   Dehydration 06/02/2011   Ischemic cardiomyopathy 06/02/2011   DM2 (diabetes mellitus, type 2) ([place]) 06/02/2011   Essential hypertension 06/02/2011    Patient's Medications  New Prescriptions   No medications on file  Previous Medications   CARVEDILOL (COREG) 6.25 MG TABLET    Take 1 tablet (6.25 mg total) by mouth 2 (two) times daily.   ELIQUIS 5 MG TABS TABLET    Take 1 tablet by mouth twice daily   EMPAGLIFLOZIN (JARDIANCE) 10 MG TABS TABLET    Take 1 tablet (10 mg total) by mouth daily before breakfast.   FUROSEMIDE (LASIX) 40 MG TABLET    Take 40 mg by mouth in the morning.   GABAPENTIN (NEURONTIN) 300 MG CAPSULE    Take 600 mg by mouth at bedtime.   GLIPIZIDE (GLUCOTROL XL) 10 MG 24 HR  TABLET    Take 10 mg by mouth at bedtime.   METFORMIN (GLUCOPHAGE-XR) 500 MG 24 HR TABLET    Take 1,000 mg by mouth 2 (two) times daily.   ONETOUCH VERIO TEST STRIP    1 each daily.   ROSUVASTATIN (CRESTOR) 20 MG TABLET    Take 10 mg by mouth every other day. In the evening  Modified Medications   No medications on file  Discontinued Medications   NUZYRA 150 MG TABS    Take 2 tablets by mouth daily.    Subjective: [name] is in for his routine follow-up visit.  He is accompanied by his wife.  He was seen by my partner, Dr. Carlyle Basques in June for left fifth metatarsal head osteomyelitis complicating an open ulcer where a callus had been debrided.  A recent MRI confirmed osteomyelitis.  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  He completed 6 weeks of IV ertapenem before receiving a 2-week course of oral trimethoprim/sulfamethoxazole which he completed on 02/12/2021.    In early September he had sudden onset of increased pain and drainage from his left lateral foot ulcer.  He was seen by Dr. Dellia Nims at the wound center on 03/06/2021 who noted marked deterioration in the wound with exposed bone, necrotic tissue, eschar and slough.  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  He was started on omadacycline which Continued when I saw him on 03/12/2021  He  underwent left fifth toe and partial metatarsal ray amputation by Dr. [name] on 03/20/2021.  No cultures were obtained.  His wound has been healing nicely with out any drainage.  He started having problems tolerating omadacycline postoperatively.  He would develop a burning sensation in his abdomen after taking cycling each morning.  He began to have difficulty sleeping because of the discomfort.  He stopped omadacycline 3 days ago and the burning sensation promptly resolved.  He is feeling much better.  He saw Dr. Doran Durand yesterday who felt that it would  be okay to observe off of antibiotics now.  Review of Systems: Review of Systems  Constitutional:  Negative for fever.  Gastrointestinal:  Negative for abdominal pain, diarrhea, nausea and vomiting.  Musculoskeletal:  Positive for joint pain.   Past Medical History:  Diagnosis Date   Arthritis    Knee L - probably   Atrial fibrillation (HCC)    BPH (benign prostatic hyperplasia)    CAD (coronary artery disease) 07/06/2011   CHF (congestive heart failure) (HCC)    Coronary artery disease    Diabetes mellitus    Frequency of urination    Heart failure (HCC)    High cholesterol    History of nuclear stress test 09/2010   dipyridamole; mild perfusion defect due to attenuation with mild superimposed ischemia in apical septal, apical, apical inferior & apical lateral regions; rest LV enlarged in size; prominent gut uptake in infero-apical region; no significant ischemia demonstrated; low risk scan    HNP (herniated nucleus pulposus), lumbar    Hypertension    Ischemic cardiomyopathy    LV dysfunction 07/06/2011   Nocturia    Right bundle branch block    S/P CABG (coronary artery bypass graft) 08/03/2011   x3; LIMA to LAD,, SVG to PDA, SVG to posterolateral branch of RCA; Dr. Ellison Hughs   Sleep apnea    sleep study 10/2010- AHI during total sleep 32.1/hr and during REM 62.3/hr (severe sleep apnea)unable to tolerate c pap   Spinal stenosis of lumbar region    Stroke [facility])    [place]     Social History   Tobacco Use   Smoking status: Former    Types: Cigars    Quit date: 09/07/2011    Years since quitting: 9.5   Smokeless tobacco: Former    Quit date: 02/28/2012  Substance Use Topics   Alcohol use: Yes    Alcohol/week: 0.0 standard drinks    Comment: occasional   Drug use: No    Family History  Problem Relation Age of Onset   Anesthesia problems Daughter    Cancer Mother    Lung disease Father        & heart disease   Colon cancer Sister    Sudden death Maternal  Grandmother     Allergies  Allergen Reactions   Entresto [Sacubitril-Valsartan] Other (See Comments)    dizziness   Sacubitril Other (See Comments)     Dizziness    Objective: Vitals:   04/03/21 1044  BP: 121/68  Pulse: 68  Temp: 98 F (36.7 C)  TempSrc: Oral  SpO2: 98%   There is no height or weight on file to calculate BMI.  Physical Exam Constitutional:      Comments: His spirits are good.  Cardiovascular:     Rate and Rhythm: Normal rate.  Pulmonary:     Effort: Pulmonary effort is normal.  Musculoskeletal:     Comments: His left fifth toe is surgically  absent.  Sutures remain in his lateral foot surgical incision.  No swelling, redness, drainage or odor.     Lab Results Sed Rate (mm/h)  Date Value  03/12/2021 11  12/11/2020 33 (H)   CRP (mg/L)  Date Value  03/12/2021 4.3  12/11/2020 28.2 (H)      Problem List Items Addressed This Visit       High   Chronic osteomyelitis of left foot ([place])    I am quite hopeful that his infection has been cured following 2 recent long courses of antibiotic therapy and his recent surgery.  I agree with observation off of antibiotics now.  He will follow-up in 4 weeks.       Michel Bickers, MD [facility] [phone] pager   [phone] cell 04/03/2021, 11:07 AM

## 2021-04-03 NOTE — Assessment & Plan Note (Signed)
I am quite hopeful that his infection has been cured following 2 recent long courses of antibiotic therapy and his recent surgery.  I agree with observation off of antibiotics now.  He will follow-up in 4 weeks.

## 2021-04-14 DIAGNOSIS — E113293 Type 2 diabetes mellitus with mild nonproliferative diabetic retinopathy without macular edema, bilateral: Secondary | ICD-10-CM | POA: Diagnosis not present

## 2021-04-21 DIAGNOSIS — Z85828 Personal history of other malignant neoplasm of skin: Secondary | ICD-10-CM | POA: Diagnosis not present

## 2021-04-21 DIAGNOSIS — Z08 Encounter for follow-up examination after completed treatment for malignant neoplasm: Secondary | ICD-10-CM | POA: Diagnosis not present

## 2021-04-24 DIAGNOSIS — I10 Essential (primary) hypertension: Secondary | ICD-10-CM | POA: Diagnosis not present

## 2021-04-24 DIAGNOSIS — E1122 Type 2 diabetes mellitus with diabetic chronic kidney disease: Secondary | ICD-10-CM | POA: Diagnosis not present

## 2021-04-24 DIAGNOSIS — E7849 Other hyperlipidemia: Secondary | ICD-10-CM | POA: Diagnosis not present

## 2021-04-24 DIAGNOSIS — E782 Mixed hyperlipidemia: Secondary | ICD-10-CM | POA: Diagnosis not present

## 2021-04-24 DIAGNOSIS — N183 Chronic kidney disease, stage 3 unspecified: Secondary | ICD-10-CM | POA: Diagnosis not present

## 2021-04-28 DIAGNOSIS — R2681 Unsteadiness on feet: Secondary | ICD-10-CM | POA: Diagnosis not present

## 2021-04-28 DIAGNOSIS — E1169 Type 2 diabetes mellitus with other specified complication: Secondary | ICD-10-CM | POA: Diagnosis not present

## 2021-04-28 DIAGNOSIS — M79672 Pain in left foot: Secondary | ICD-10-CM | POA: Diagnosis not present

## 2021-04-28 DIAGNOSIS — Z89422 Acquired absence of other left toe(s): Secondary | ICD-10-CM | POA: Diagnosis not present

## 2021-04-28 DIAGNOSIS — L97509 Non-pressure chronic ulcer of other part of unspecified foot with unspecified severity: Secondary | ICD-10-CM | POA: Diagnosis not present

## 2021-04-28 DIAGNOSIS — M869 Osteomyelitis, unspecified: Secondary | ICD-10-CM | POA: Diagnosis not present

## 2021-04-28 DIAGNOSIS — E11621 Type 2 diabetes mellitus with foot ulcer: Secondary | ICD-10-CM | POA: Diagnosis not present

## 2021-04-28 DIAGNOSIS — R29898 Other symptoms and signs involving the musculoskeletal system: Secondary | ICD-10-CM | POA: Diagnosis not present

## 2021-04-29 DIAGNOSIS — Z1389 Encounter for screening for other disorder: Secondary | ICD-10-CM | POA: Diagnosis not present

## 2021-04-29 DIAGNOSIS — I5033 Acute on chronic diastolic (congestive) heart failure: Secondary | ICD-10-CM | POA: Diagnosis not present

## 2021-04-29 DIAGNOSIS — I255 Ischemic cardiomyopathy: Secondary | ICD-10-CM | POA: Diagnosis not present

## 2021-04-29 DIAGNOSIS — E1165 Type 2 diabetes mellitus with hyperglycemia: Secondary | ICD-10-CM | POA: Diagnosis not present

## 2021-04-29 DIAGNOSIS — I10 Essential (primary) hypertension: Secondary | ICD-10-CM | POA: Diagnosis not present

## 2021-04-29 DIAGNOSIS — Z23 Encounter for immunization: Secondary | ICD-10-CM | POA: Diagnosis not present

## 2021-04-29 DIAGNOSIS — Z7189 Other specified counseling: Secondary | ICD-10-CM | POA: Diagnosis not present

## 2021-04-29 DIAGNOSIS — Z1331 Encounter for screening for depression: Secondary | ICD-10-CM | POA: Diagnosis not present

## 2021-05-01 ENCOUNTER — Other Ambulatory Visit: Payer: Self-pay

## 2021-05-01 ENCOUNTER — Ambulatory Visit (INDEPENDENT_AMBULATORY_CARE_PROVIDER_SITE_OTHER): Payer: Medicare Other | Admitting: Internal Medicine

## 2021-05-01 DIAGNOSIS — M86672 Other chronic osteomyelitis, left ankle and foot: Secondary | ICD-10-CM | POA: Diagnosis not present

## 2021-05-01 NOTE — Progress Notes (Signed)
Virtual Visit via Telephone Note  I connected with [name] [name] on 05/01/21 at 10:45 AM EDT by telephone and verified that I am speaking with the correct person using two identifiers.  Location: Patient: Home Provider: [facility]   I discussed the limitations, risks, security and privacy concerns of performing an evaluation and management service by telephone and the availability of in person appointments. I also discussed with the patient that there may be a patient responsible charge related to this service. The patient expressed understanding and agreed to proceed.   History of Present Illness: I called and spoke to [name] [name] today.  He completed omadacycline therapy on 03/31/2021.  He says that his left foot continues to heal nicely.  He has had no wound drainage or new redness, pain or swelling.  He recently started some physical therapy.  He has not had any fever, chills or sweats.  He has not had any nausea, vomiting or diarrhea.   Observations/Objective:   Assessment and Plan: I am quite hopeful that his foot infection has been cured through a combination of time, surgery and antibiotics.  Follow Up Instructions: He can follow-up here as needed   I discussed the assessment and treatment plan with the patient. The patient was provided an opportunity to ask questions and all were answered. The patient agreed with the plan and demonstrated an understanding of the instructions.   The patient was advised to call back or seek an in-person evaluation if the symptoms worsen or if the condition fails to improve as anticipated.  I provided 13 minutes of non-face-to-face time during this encounter.   Michel Bickers, MD

## 2021-05-02 DIAGNOSIS — E11621 Type 2 diabetes mellitus with foot ulcer: Secondary | ICD-10-CM | POA: Diagnosis not present

## 2021-05-02 DIAGNOSIS — M79672 Pain in left foot: Secondary | ICD-10-CM | POA: Diagnosis not present

## 2021-05-02 DIAGNOSIS — M869 Osteomyelitis, unspecified: Secondary | ICD-10-CM | POA: Diagnosis not present

## 2021-05-02 DIAGNOSIS — E1169 Type 2 diabetes mellitus with other specified complication: Secondary | ICD-10-CM | POA: Diagnosis not present

## 2021-05-02 DIAGNOSIS — L97509 Non-pressure chronic ulcer of other part of unspecified foot with unspecified severity: Secondary | ICD-10-CM | POA: Diagnosis not present

## 2021-05-02 DIAGNOSIS — Z89422 Acquired absence of other left toe(s): Secondary | ICD-10-CM | POA: Diagnosis not present

## 2021-05-05 DIAGNOSIS — L97509 Non-pressure chronic ulcer of other part of unspecified foot with unspecified severity: Secondary | ICD-10-CM | POA: Diagnosis not present

## 2021-05-05 DIAGNOSIS — E1169 Type 2 diabetes mellitus with other specified complication: Secondary | ICD-10-CM | POA: Diagnosis not present

## 2021-05-05 DIAGNOSIS — M869 Osteomyelitis, unspecified: Secondary | ICD-10-CM | POA: Diagnosis not present

## 2021-05-05 DIAGNOSIS — E11621 Type 2 diabetes mellitus with foot ulcer: Secondary | ICD-10-CM | POA: Diagnosis not present

## 2021-05-05 DIAGNOSIS — Z89422 Acquired absence of other left toe(s): Secondary | ICD-10-CM | POA: Diagnosis not present

## 2021-05-05 DIAGNOSIS — M79672 Pain in left foot: Secondary | ICD-10-CM | POA: Diagnosis not present

## 2021-05-08 DIAGNOSIS — Z89422 Acquired absence of other left toe(s): Secondary | ICD-10-CM | POA: Diagnosis not present

## 2021-05-08 DIAGNOSIS — E11621 Type 2 diabetes mellitus with foot ulcer: Secondary | ICD-10-CM | POA: Diagnosis not present

## 2021-05-08 DIAGNOSIS — M79672 Pain in left foot: Secondary | ICD-10-CM | POA: Diagnosis not present

## 2021-05-08 DIAGNOSIS — E1169 Type 2 diabetes mellitus with other specified complication: Secondary | ICD-10-CM | POA: Diagnosis not present

## 2021-05-08 DIAGNOSIS — M869 Osteomyelitis, unspecified: Secondary | ICD-10-CM | POA: Diagnosis not present

## 2021-05-08 DIAGNOSIS — L97509 Non-pressure chronic ulcer of other part of unspecified foot with unspecified severity: Secondary | ICD-10-CM | POA: Diagnosis not present

## 2021-05-15 DIAGNOSIS — E11621 Type 2 diabetes mellitus with foot ulcer: Secondary | ICD-10-CM | POA: Diagnosis not present

## 2021-05-15 DIAGNOSIS — E1169 Type 2 diabetes mellitus with other specified complication: Secondary | ICD-10-CM | POA: Diagnosis not present

## 2021-05-15 DIAGNOSIS — L97509 Non-pressure chronic ulcer of other part of unspecified foot with unspecified severity: Secondary | ICD-10-CM | POA: Diagnosis not present

## 2021-05-15 DIAGNOSIS — Z89422 Acquired absence of other left toe(s): Secondary | ICD-10-CM | POA: Diagnosis not present

## 2021-05-15 DIAGNOSIS — M79672 Pain in left foot: Secondary | ICD-10-CM | POA: Diagnosis not present

## 2021-05-15 DIAGNOSIS — M869 Osteomyelitis, unspecified: Secondary | ICD-10-CM | POA: Diagnosis not present

## 2021-05-20 DIAGNOSIS — E1169 Type 2 diabetes mellitus with other specified complication: Secondary | ICD-10-CM | POA: Diagnosis not present

## 2021-05-20 DIAGNOSIS — M869 Osteomyelitis, unspecified: Secondary | ICD-10-CM | POA: Diagnosis not present

## 2021-05-20 DIAGNOSIS — Z89422 Acquired absence of other left toe(s): Secondary | ICD-10-CM | POA: Diagnosis not present

## 2021-05-20 DIAGNOSIS — M79672 Pain in left foot: Secondary | ICD-10-CM | POA: Diagnosis not present

## 2021-05-20 DIAGNOSIS — E11621 Type 2 diabetes mellitus with foot ulcer: Secondary | ICD-10-CM | POA: Diagnosis not present

## 2021-05-20 DIAGNOSIS — L97509 Non-pressure chronic ulcer of other part of unspecified foot with unspecified severity: Secondary | ICD-10-CM | POA: Diagnosis not present

## 2021-05-27 DIAGNOSIS — Z89422 Acquired absence of other left toe(s): Secondary | ICD-10-CM | POA: Diagnosis not present

## 2021-05-27 DIAGNOSIS — M79672 Pain in left foot: Secondary | ICD-10-CM | POA: Diagnosis not present

## 2021-05-27 DIAGNOSIS — E11621 Type 2 diabetes mellitus with foot ulcer: Secondary | ICD-10-CM | POA: Diagnosis not present

## 2021-05-27 DIAGNOSIS — M869 Osteomyelitis, unspecified: Secondary | ICD-10-CM | POA: Diagnosis not present

## 2021-05-27 DIAGNOSIS — L97509 Non-pressure chronic ulcer of other part of unspecified foot with unspecified severity: Secondary | ICD-10-CM | POA: Diagnosis not present

## 2021-05-27 DIAGNOSIS — E1169 Type 2 diabetes mellitus with other specified complication: Secondary | ICD-10-CM | POA: Diagnosis not present

## 2021-05-29 DIAGNOSIS — M869 Osteomyelitis, unspecified: Secondary | ICD-10-CM | POA: Diagnosis not present

## 2021-05-29 DIAGNOSIS — R29898 Other symptoms and signs involving the musculoskeletal system: Secondary | ICD-10-CM | POA: Diagnosis not present

## 2021-05-29 DIAGNOSIS — L97509 Non-pressure chronic ulcer of other part of unspecified foot with unspecified severity: Secondary | ICD-10-CM | POA: Diagnosis not present

## 2021-05-29 DIAGNOSIS — E11621 Type 2 diabetes mellitus with foot ulcer: Secondary | ICD-10-CM | POA: Diagnosis not present

## 2021-05-29 DIAGNOSIS — E1169 Type 2 diabetes mellitus with other specified complication: Secondary | ICD-10-CM | POA: Diagnosis not present

## 2021-05-29 DIAGNOSIS — M79672 Pain in left foot: Secondary | ICD-10-CM | POA: Diagnosis not present

## 2021-05-29 DIAGNOSIS — Z89422 Acquired absence of other left toe(s): Secondary | ICD-10-CM | POA: Diagnosis not present

## 2021-05-29 DIAGNOSIS — R2681 Unsteadiness on feet: Secondary | ICD-10-CM | POA: Diagnosis not present

## 2021-06-03 DIAGNOSIS — Z89422 Acquired absence of other left toe(s): Secondary | ICD-10-CM | POA: Diagnosis not present

## 2021-06-03 DIAGNOSIS — E11621 Type 2 diabetes mellitus with foot ulcer: Secondary | ICD-10-CM | POA: Diagnosis not present

## 2021-06-03 DIAGNOSIS — E1169 Type 2 diabetes mellitus with other specified complication: Secondary | ICD-10-CM | POA: Diagnosis not present

## 2021-06-03 DIAGNOSIS — M869 Osteomyelitis, unspecified: Secondary | ICD-10-CM | POA: Diagnosis not present

## 2021-06-03 DIAGNOSIS — L97509 Non-pressure chronic ulcer of other part of unspecified foot with unspecified severity: Secondary | ICD-10-CM | POA: Diagnosis not present

## 2021-06-03 DIAGNOSIS — M79672 Pain in left foot: Secondary | ICD-10-CM | POA: Diagnosis not present

## 2021-06-05 DIAGNOSIS — L97509 Non-pressure chronic ulcer of other part of unspecified foot with unspecified severity: Secondary | ICD-10-CM | POA: Diagnosis not present

## 2021-06-05 DIAGNOSIS — E1169 Type 2 diabetes mellitus with other specified complication: Secondary | ICD-10-CM | POA: Diagnosis not present

## 2021-06-05 DIAGNOSIS — M869 Osteomyelitis, unspecified: Secondary | ICD-10-CM | POA: Diagnosis not present

## 2021-06-05 DIAGNOSIS — E11621 Type 2 diabetes mellitus with foot ulcer: Secondary | ICD-10-CM | POA: Diagnosis not present

## 2021-06-05 DIAGNOSIS — Z89422 Acquired absence of other left toe(s): Secondary | ICD-10-CM | POA: Diagnosis not present

## 2021-06-05 DIAGNOSIS — M79672 Pain in left foot: Secondary | ICD-10-CM | POA: Diagnosis not present

## 2021-06-12 DIAGNOSIS — E11621 Type 2 diabetes mellitus with foot ulcer: Secondary | ICD-10-CM | POA: Diagnosis not present

## 2021-06-12 DIAGNOSIS — M79672 Pain in left foot: Secondary | ICD-10-CM | POA: Diagnosis not present

## 2021-06-12 DIAGNOSIS — M869 Osteomyelitis, unspecified: Secondary | ICD-10-CM | POA: Diagnosis not present

## 2021-06-12 DIAGNOSIS — Z89422 Acquired absence of other left toe(s): Secondary | ICD-10-CM | POA: Diagnosis not present

## 2021-06-12 DIAGNOSIS — L97509 Non-pressure chronic ulcer of other part of unspecified foot with unspecified severity: Secondary | ICD-10-CM | POA: Diagnosis not present

## 2021-06-12 DIAGNOSIS — E1169 Type 2 diabetes mellitus with other specified complication: Secondary | ICD-10-CM | POA: Diagnosis not present

## 2021-06-18 DIAGNOSIS — Z89422 Acquired absence of other left toe(s): Secondary | ICD-10-CM | POA: Diagnosis not present

## 2021-06-18 DIAGNOSIS — E11621 Type 2 diabetes mellitus with foot ulcer: Secondary | ICD-10-CM | POA: Diagnosis not present

## 2021-06-18 DIAGNOSIS — L97509 Non-pressure chronic ulcer of other part of unspecified foot with unspecified severity: Secondary | ICD-10-CM | POA: Diagnosis not present

## 2021-06-18 DIAGNOSIS — M79672 Pain in left foot: Secondary | ICD-10-CM | POA: Diagnosis not present

## 2021-06-18 DIAGNOSIS — E1169 Type 2 diabetes mellitus with other specified complication: Secondary | ICD-10-CM | POA: Diagnosis not present

## 2021-06-18 DIAGNOSIS — M869 Osteomyelitis, unspecified: Secondary | ICD-10-CM | POA: Diagnosis not present

## 2021-06-20 DIAGNOSIS — E1169 Type 2 diabetes mellitus with other specified complication: Secondary | ICD-10-CM | POA: Diagnosis not present

## 2021-06-20 DIAGNOSIS — E11621 Type 2 diabetes mellitus with foot ulcer: Secondary | ICD-10-CM | POA: Diagnosis not present

## 2021-06-20 DIAGNOSIS — M869 Osteomyelitis, unspecified: Secondary | ICD-10-CM | POA: Diagnosis not present

## 2021-06-20 DIAGNOSIS — Z89422 Acquired absence of other left toe(s): Secondary | ICD-10-CM | POA: Diagnosis not present

## 2021-06-20 DIAGNOSIS — L97509 Non-pressure chronic ulcer of other part of unspecified foot with unspecified severity: Secondary | ICD-10-CM | POA: Diagnosis not present

## 2021-06-20 DIAGNOSIS — M79672 Pain in left foot: Secondary | ICD-10-CM | POA: Diagnosis not present

## 2021-06-24 DIAGNOSIS — E11621 Type 2 diabetes mellitus with foot ulcer: Secondary | ICD-10-CM | POA: Diagnosis not present

## 2021-06-24 DIAGNOSIS — M869 Osteomyelitis, unspecified: Secondary | ICD-10-CM | POA: Diagnosis not present

## 2021-06-24 DIAGNOSIS — M79672 Pain in left foot: Secondary | ICD-10-CM | POA: Diagnosis not present

## 2021-06-24 DIAGNOSIS — Z89422 Acquired absence of other left toe(s): Secondary | ICD-10-CM | POA: Diagnosis not present

## 2021-06-24 DIAGNOSIS — L97509 Non-pressure chronic ulcer of other part of unspecified foot with unspecified severity: Secondary | ICD-10-CM | POA: Diagnosis not present

## 2021-06-24 DIAGNOSIS — E1169 Type 2 diabetes mellitus with other specified complication: Secondary | ICD-10-CM | POA: Diagnosis not present

## 2021-08-14 ENCOUNTER — Other Ambulatory Visit: Payer: Self-pay

## 2021-08-14 ENCOUNTER — Encounter: Payer: Self-pay | Admitting: Internal Medicine

## 2021-08-14 ENCOUNTER — Ambulatory Visit (INDEPENDENT_AMBULATORY_CARE_PROVIDER_SITE_OTHER): Payer: Medicare Other | Admitting: Internal Medicine

## 2021-08-14 VITALS — BP 116/56 | HR 67 | Ht 74.0 in | Wt 225.0 lb

## 2021-08-14 DIAGNOSIS — I951 Orthostatic hypotension: Secondary | ICD-10-CM | POA: Diagnosis not present

## 2021-08-14 DIAGNOSIS — Z951 Presence of aortocoronary bypass graft: Secondary | ICD-10-CM

## 2021-08-14 DIAGNOSIS — E785 Hyperlipidemia, unspecified: Secondary | ICD-10-CM

## 2021-08-14 DIAGNOSIS — I4891 Unspecified atrial fibrillation: Secondary | ICD-10-CM

## 2021-08-14 DIAGNOSIS — I5022 Chronic systolic (congestive) heart failure: Secondary | ICD-10-CM | POA: Diagnosis not present

## 2021-08-14 LAB — LIPID PANEL
Chol/HDL Ratio: 4.5 ratio (ref 0.0–5.0)
Cholesterol, Total: 172 mg/dL (ref 100–199)
HDL: 38 mg/dL — ABNORMAL LOW (ref >39)
LDL Chol Calc (NIH): 118 mg/dL — ABNORMAL HIGH (ref 0–99)
Triglycerides: 86 mg/dL (ref 0–149)
VLDL Cholesterol Cal: 16 mg/dL (ref 5–40)

## 2021-08-14 NOTE — Progress Notes (Signed)
OFFICE NOTE  Chief Complaint:  Follow-up  Primary Care Physician: [name] [name], MD  HPI:  [name] [name] is a pleasant 76 year old gentleman previously followed by [name] [name]. His past medical history is significant for coronary artery disease. In 2013 [name] underwent multivessel CABG for an ischemic cardiomyopathy. EF was 20-25% prior to surgery however post surgery his EF had improved up to 45-50%. Recently his EF was 40-45% by echo in May of 2014. There was mild mitral regurgitation, mild to moderately dilated left atrium and mild LVH. [name] [name] has been describing some anxiety. [name] also reports some pain in his legs however underwent Dopplers in September 2014 which showed preserved ABIs bilaterally a 1.0 on the right and 1.1 on the left. The bilateral peroneal and posterior tibial arteries were occluded. [name] reports some optimal control of his diabetes. His A1c was 7.2. [name] is concerned about the cost of taking Tradjenta. [name] is followed by [name] [name].   [name] was recently seen in the emergency room [name] There [name] presented with hypotension and was given 1 L normal saline and his symptoms improved. [name] had been having dizziness as well as some upper back pain into the left shoulder blade with exertion recently. The symptoms are worse particularly when climbing up ladders or going up stairs and are relieved at rest. After that hospitalization it was recommended that [name] discontinue his ACE inhibitor and beta blocker, and his blood pressure appears improved today up to 110/60.  [name] [name] returns today for followup of his stress test. This is interpreted as nonischemic with an EF of 44%. [name] reports still feeling very fatigued and extremely short of breath when doing activities. [name] says that the other day [name] tried to hit golf balls and felt that [name] was totally exhausted after just an hour. [name] reports his blood pressure has improved somewhat off of all medications however remains  low. [name] has had symptoms of orthostatic hypotension in the past. His symptoms do feel similar to prior to his bypass surgery. [name] also feels that [name] was much better after surgery than [name] is now and that his symptoms have been going on for the past 2 months.  At his last office visit, I recommended that [name] [name] have a repeat cardiac catheterization (08/2013). This demonstrated the following:  Impression:  1. 2 vessel native CAD with occluded LAD in the mid-vessel  2. Patent LIMA-LAD, SVG to PLA and SVG to PDA grafts with TIMI III flow  3. LVEDP = 13 mmHg  4. Random PVC's were noted  Based on these findings, I could not find any new cardiac cause of his symptoms. I suspect that some of his fatigue could be related to labile blood sugars. [name] says unexplained hypotension but is normotensive off of medications.  [name] [name] returns today for followup. [name] now reports feeling better since [name] had change in his medications. [name] is established with [name] [name] who adjusted his diabetes medicines and stopped his Invokana. His shortness of breath and fatigue in both improved. [name] is now been expected some problems with left heel pain. [name] was found to have some spinal stenosis but has had improvement with inserts in his shoes.  I saw [name] [name] back in the office today. [name] has successfully underwent back surgery earlier this year which she says initially was much improved, however [name] says [name] subsequently developed more pain going down his legs. This sounds like it's neuropathic  pain, but is reluctant to try medication for it. [name] also significantly fatigue. This could be due to a number of etiologies. 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 Also, [name] is noted to have a history of obstructive sleep apnea. [name] was placed on BiPAP, but has not been compliant with that due to difficulty wearing the mask. [name] also never  had follow-up of his sleep study.  [name] [name] returns today for follow-up. [name] reports [name]'s had worsening dyspnea and fatigue. [name] says that [name] can hardly sleep at night. [name] is struggling with significant anxiety. 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 [name]'s not been wearing the BiPAP due to significant anxiety and difficulty sleeping. [name] feels like [name] gets smothered with his machine. Communication between [name] [name] and Dr. Quintin [name] led to the starting of Celexa as well as Klonopin to use as needed at night. [name] says that it does give him about 6 hours of sleep. [name] has not been using his BiPAP as instructed. [name] is reportedly had more worsening shortness of breath. [name] was seen in the ER for this and it was thought this was due [email or web address] with BiPAP. His BNP however is elevated over 300. Today in the office [name] says that [name]'s had more swelling and more abdominal fullness as well as leg edema.  I had the pleasure seeing [name] [name] back today in the office. [name] seems to respond nicely to Lasix. I started him on 40 mg [name] and while I was out of town my partner reviewed his lab work which included an elevated BNP over 300. [name] increased his Lasix to 40 mg twice a day. [name] [name] says [name]'s had a significant improvement in his breathing. The BNP now is in the mid 200s. Renal function is stable based on labs today. Unfortunately, his echocardiogram does show a new cardiomyopathy with EF 30-35%. It should be noted that [name] felt "awful" on blood pressure/heart failure medicines and had taken himself off of ACE inhibitor and beta blocker in the past. The echo however does suggest inferior and anterolateral wall motion abnormalities which are new and could indicate graft dysfunction. I performed his last heart catheterization in 2015 which showed all 3 grafts patent.  [name] [name] returns today for follow-up of his left heart catheterization. I perform this a  few weeks ago and [name] had patent bypass grafts however LV function is reduced. Cardiac output is also reduced. Filling pressures were mildly elevated. [name] reports since discharge that [name]'s had some improvement of his symptoms. [name] was started on low-dose beta blocker but [name] does report fatigue. When I asked him how they compared to previously [name] said [name] thinks attack sleep better since [name] started on the beta blocker but not back to what [name] thinks his baseline should be. [name] continues to have problems with left leg pain. [name] had ultrasound of the left leg which were unrevealing. [name] also had nerve conduction studies which I sent him for which were not suggestive of neuropathy. His symptoms were worse while lying flat on the Cath Lab table and I suspect this could be again coming from his back and [name] may need repeat orthopedic evaluation. His primary care provider started him on Celexa recently as well for anxiety.  12/23/2015  [name] [name] was seen back in the office today  in follow-up. Overall [name] seems to be doing well. [name]'s tolerating low-dose carvedilol and has had some very infrequent morning dizziness. [name] was previously on Lasix 40 mg twice a day but ran out of the medicine about 8 days ago. [name]'s not had any worsening weight gain or swelling nor worsening shortness of breath over the past week. This could be that [name]'s had some improvement in his cardiomyopathy. Nevertheless, I would like him to be on some Lasix at least until we can determine whether or not [name] is euvolemic with lab work.  05/27/2016  [name] [name] returns today for follow-up. [name]'s gained about 6 pounds of weight since I last saw him. [name] denies any worsening shortness of breath or orthostatic dizziness. [name] saw [name] [name] in August for following of his obstructive sleep apnea. [name] remains physically active and denies any chest pain. [name] recently had a repeat echo in October 2017 which showed a small improvement in LV function with EF up to 35-40%. Heart  failure regimen includes aspirin, carvedilol, Crestor, and spironolactone. [name] is not currently on a ARB is [name]'s had orthostatic symptoms in the past although that his generally resolved. His creatinine is normal.  08/24/2016  I saw [name] [name] today in follow-up. [name] again gained about 4 pounds over the past month. [name] felt that the weight gain was heart failure related because [name] got more short of breath particularly walking up stairs. Based on that [name] increased his Lasix back to 40 mg [name]. [name] notes that his urine is been darker and his urination is not necessarily improved. [name]'s also been more dizzy. [name] says [name] gets somewhat presyncopal with change in position. Lab work 12 days ago indicated a stable creatinine however. Despite this, I feel that [name] may be somewhat presyncopal perhaps on too high of a dose of diuretics. [name] is also on Entresto 24/26 and Aldactone 12.5 mg [name]. [name] also complained of some pain across the back between the shoulder blades. [name] said this got better with resting and drinking water. It's difficult to say whether that it was the water or perhaps resting from his activities. I cannot exclude that this could be angina. His EKG however is reassuring today.  09/21/2016  Mr. Dimare was seen today in follow-up. I decreased his Lasix, however [name] does not feel any change in his dizziness. There is no evidence of presyncope. [name] has gained about 4 pounds which I suspect is some fluid retention. Blood pressure is well-controlled today 118/56.  02/26/2017  [name] [name] returns today for follow-up. [name] had more recent dizziness and it seemed to worsen on Entresto. [name] discontinued that and feels that [name]'s now much better. Overall [name] says [name] feels well. [name] is not on ACE inhibitor or ARB but remains on carvedilol. Is also on Lasix 40 mg [name]. Blood pressure is stable 122/52.  08/10/2017  [name] [name] returns for follow-up.  Overall [name] seems to be feeling very well.  Denies any chest pain or  worsening shortness of breath.  [name] gets occasional dizziness but that is much improved.  Of note an EKG was performed again today and there is a suggestion that there may be underlying atrial flutter.  Heart rate was 74 and EKG looks very similar to his prior EKG.  The computer has interpreted a sinus rhythm with first-degree AV block and bifascicular block.  I compared EKG to his previous EKG last August which looks similar however it is distinctly different from the EKG last  of February.  [name] is asymptomatic, but not anticoagulated.  Otherwise, labs reviewed from October indicate total cholesterol 134, HDL 36 LDL 67 and triglycerides 157.  Hemoglobin A1c of 7.6 and serum creatinine 0.9.  08/30/2017  [name] [name] returns today for follow-up of his studies.  [name] underwent an echocardiogram to evaluate for change in LVEF, but more importantly to rule out atrial flutter.  His EKG was abnormal and suggested possible atrial flutter, however [name] was asymptomatic.  The echo did not show any evidence of atrial flutter.  LVEF was stable at 40%.  [name] also underwent a home 48-hour Holter monitor.  This demonstrated sinus rhythm with PACs and PVCs, but no evidence of atrial fibrillation or flutter.  Remains asymptomatic.  His only other complaint today is chronic leg pain.  [name] was seen by Dr. Gwenlyn Found and evaluated with lower extremity arterial Dopplers.  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  A referral to neurosurgery was suggested but not placed.  01/24/2018  [name] [name] returns today for follow-up.  Overall [name] says [name] is doing really well.  [name] underwent 2 back injections and has had improvement in his back pain and leg pain.  [name] had an episode in June where [name] became somewhat dehydrated.  [name] presented to the ER with orthostatic hypotension.  His Lasix was cut back to 20 mg [name].  Weight is stayed stable effect is been improved from 252-246.  [name] says [name] is actually had enough energy  to play golf recently.  08/01/2018  [name] [name] seen today in routine follow-up.  [name] has some occasional dizziness which is persistent.  [name] says [name] gets a little lightheaded when [name] urinates.  [name] has been off of tamsulosin with no specific benefit and now is taking the medication every third day.  Blood pressure is stable.  Weight is stable.  [name] denies any chest pain or worsening shortness of breath with exertion.  His last echo showed an EF of 40% which is stable if not slightly improved from an echo in 2017.  Unfortunately [name] could not tolerate Entresto and remains on carvedilol.  [name] endorses NYHA class I-II heart failure symptoms.  Recent lab work showed total cholesterol 151, HDL 39, LDL 79 triglycerides 166.  Hemoglobin A1c of 7.3.  03/30/2019  Mr. Tremblay seen today in follow-up.  Overall [name] is doing well and has no complaints.  [name] has not recently struggled with any of the dizziness [name] has had previously.  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  In the past [name] has had EKG changes that were questionable for possible atrial flutter however monitoring as well as an echocardiogram failed to show any evidence of atrial flutter or fibrillation.  This is now clear finding of atrial fibrillation on EKG with an irregularly irregular rhythm with no evidence of P waves.  [name] reports being asymptomatic with this.  05/12/2019   Mr. Knupp returns today for follow-up of his A. fib.  [name] reports [name] has been compliant with Eliquis.  [name] says [name] had a couple episodes of loose stools.  [name] thought it might be due to the medicine but this seems quite atypical.  [name] stopped some of his stool softener/bulking agents.  Weight is down about 3 pounds.  [name] increased his Lasix because [name] felt like it was a little wheezy.  Possibly could have had some more heart failure because [name] is in  A. fib and does have a history of heart failure.  EKG again shows atrial  flutter/fibrillation with variable AV response at 74.  Since this is presumably persistent at this point we discussed an elective cardioversion and [name] is agreeable to this.  06/13/2019  Mr. Engelhard was seen today in follow-up.  [name] underwent cardioversion by myself in November.  Since then [name] has felt fairly well.  His EKG today shows that [name] is maintaining sinus rhythm.  Blood pressure was 119/63 however [name] has had some positional dizziness at home.  His daughter notes [name] has had some blood pressures with low diastolics less than 50 and [name] does feel some dizziness.  [name] also feels some fatigue when exerting himself a lot.  I suspect this could be due to hypotension.  Is possible also that his EF has improved now that [name] is back in sinus and that [name] may be over diuresed being on both Lasix and Aldactone.  09/15/2019  RandallHerda returns for follow-up.  [name] reports improvement in his orthostatic symptoms after stopping Aldactone and his PCP also stopped tamsulosin.  [name] is now on some saw palmetto for BPH.  Unfortunately [name] does get a little bit more short of breath and I wonder if this is due to need for more diuretic.  His LVEF was 40% last year however has not been reassessed.  I would like to get an echo to recheck that.  03/25/2020  Mr. Steagall is seen today for 35-month follow-up.  [name] denies any further orthostatic symptoms.  [name] was not feeling well on Aldactone.  [name] was also taken off tamsulosin.  [name] is only on carvedilol for heart failure and unfortunately his LVEF declined further in March down to 5 to 40%, previously 40 to 45%.  I had increased his Lasix due to signs of increased LV filling pressure however then [name] became a little bit prerenal.  His Lasix was then decreased to 40 mg every morning and 20 mg every afternoon.  Since then [name] has been asymptomatic with heart failure at most with NYHA class II symptoms.  Is been struggling with issues with the CPAP and that the humidifier broke.  This is a  [employer] device and [name] was told by the company that they are no longer manufacturing them rather they are making ventilators because of COVID-19.  Lipid profile in June 2021 showed total cholesterol 168, HDL 32, triglycerides 106 and 116.  A1c was 7.7 and both remain above goal.  09/20/2020  Mr. Wollman returns today for follow-up.  Overall [name] seems to be doing pretty well.  [name] struggled with a small wound under his left fifth metatarsal.  [name] is going to the wound center on April 1.  EF had declined somewhat down to 40 to 45%.  Unfortunately has been intolerant of Entresto and losartan more recently causing worsening fatigue.  His options for heart failure medications have been limited.  [name] is a diabetic with recent A1c at 6.8.  We discussed today the possibility of adding an SGLT2 inhibitor based on positive data in heart failure and diabetes.  01/01/2021  Mr. Kerstein is seen today in follow-up.  [name] seems somewhat desponded today.  [name] says [name] has a lot going on and and unfortunately has been dealing with wound that is nonhealing in his left leg.  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  There talk about possible amputation.  [name] has been noted to have  some mild reduction in blood flow to that leg.  [name] is scheduled to actually have a retrograde angiogram tomorrow with [name].  [name] is also going to see Dr. Baxter Flattery with infectious diseases.  Hopefully they will be able to get control of this before having to consider an amputation.  [name] does say that [name] feels better though.  [name] had come off of alfuzosin which she was taking by his urologist and [name] now reports being less dizzy.  Also his blood sugars are significantly lower after starting the Jardiance.  08/14/2021  Mr. Langenderfer returns today for follow-up.  [name] seems to be doing much better.  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  [name] still struggles with some balance issues but is done better.  [name] had 1 episode where [name] became dizzy or lightheaded in January when [name] was playing golf in [place].  This seemed to resolve with rest and drinking some water.  [name] has done well with the Jardiance.  PMHx:  Past Medical History:  Diagnosis Date   Arthritis    Knee L - probably   Atrial fibrillation (HCC)    BPH (benign prostatic hyperplasia)    CAD (coronary artery disease) 07/06/2011   CHF (congestive heart failure) (HCC)    Coronary artery disease    Diabetes mellitus    Frequency of urination    Heart failure (HCC)    High cholesterol    History of nuclear stress test 09/2010   dipyridamole; mild perfusion defect due to attenuation with mild superimposed ischemia in apical septal, apical, apical inferior & apical lateral regions; rest LV enlarged in size; prominent gut uptake in infero-apical region; no significant ischemia demonstrated; low risk scan    HNP (herniated nucleus pulposus), lumbar    Hypertension    Ischemic cardiomyopathy    LV dysfunction 07/06/2011   Nocturia    Right bundle branch block    S/P CABG (coronary artery bypass graft) 08/03/2011   x3; LIMA to LAD,, SVG to PDA, SVG to posterolateral branch of RCA; Dr. Ellison Hughs   Sleep apnea    sleep study 10/2010- AHI during total sleep 32.1/hr and during REM 62.3/hr (severe sleep apnea)unable to tolerate c pap   Spinal stenosis of lumbar region    Stroke [facility])    [place]     Past Surgical History:  Procedure Laterality Date   ABDOMINAL AORTOGRAM W/LOWER EXTREMITY Left 11/14/2020   Procedure: ABDOMINAL AORTOGRAM W/LOWER EXTREMITY;  Surgeon: [name], MD;  Location: [place] [facility];  Service: Cardiovascular;  Laterality: Left;   ABDOMINAL AORTOGRAM W/LOWER EXTREMITY N/A 01/02/2021   Procedure: ABDOMINAL AORTOGRAM W/LOWER EXTREMITY;  Surgeon: [name], MD;  Location: [place] [facility];  Service: Cardiovascular;  Laterality: N/A;   AMPUTATION Left 03/20/2021   Procedure: Left foot 5th ray amputation;  Surgeon: [name], MD;  Location: [place];  Service: Orthopedics;  Laterality: Left;   CARDIAC CATHETERIZATION  01/2011   ischemic cardiomyopathy 30-35%, multivessel CAD (Dr. [name])    CARDIAC CATHETERIZATION  09/13/2015   Procedure: Right/Left Heart Cath and Coronary/Graft Angiography;  Surgeon: [name], MD;  Location: [place] [facility];  Service: Cardiovascular;;   CARDIOVERSION N/A 05/16/2019   Procedure: CARDIOVERSION;  Surgeon: [name], MD;  Location: [place];  Service: Cardiovascular;  Laterality: N/A;  Colonscopy     CORONARY ARTERY BYPASS GRAFT  08/03/2011   Procedure: CORONARY ARTERY BYPASS GRAFTING (CABG);  Surgeon: [name], MD;  Location: [place];  Service: Open Heart Surgery;  Laterality: N/A;  CABG times three using right saphenous vein and left mammary artery usisng endoscope.   LEFT HEART CATHETERIZATION WITH CORONARY/GRAFT ANGIOGRAM N/A 09/20/2013   Procedure: LEFT HEART CATHETERIZATION WITH [name];  Surgeon: [name], MD;  Location: [facility] C[facility];  Service: Cardiovascular;  Laterality: N/A;   Lower Extremity Arterial Doppler  03/16/2013   bilat ABIs demonstated normal values; R runoff - posterior tibial & anterior tibial arteries occluded; L runoff - peroneal & posterior tibial arteries occluded, anterior tibial artery appears occluded   LUMBAR LAMINECTOMY/DECOMPRESSION MICRODISCECTOMY N/A 09/12/2014   Procedure: LUMBAR DECOMPRESSION L3-L4, L4-L5, MICRODISCECTOMY L4-L5;  Surgeon: [name], MD;  Location: [facility];  Service: Orthopedics;  Laterality: N/A;   PERIPHERAL VASCULAR BALLOON ANGIOPLASTY  11/14/2020   Procedure: PERIPHERAL VASCULAR BALLOON ANGIOPLASTY;  Surgeon: [name], MD;  Location: [place] [facility];  Service: Cardiovascular;;   PERIPHERAL VASCULAR BALLOON ANGIOPLASTY Left 01/02/2021    Procedure: PERIPHERAL VASCULAR BALLOON ANGIOPLASTY;  Surgeon: [name], MD;  Location: [place] [facility];  Service: Cardiovascular;  Laterality: Left;  Failed PTA   Pilonidal Cyst removed     TRANSTHORACIC ECHOCARDIOGRAM  11/10/2012   EF 40-45%, mild LVH, mild hypokinesis of anteroseptal myocardium, grade 1 diastolic dysfunction; mild MR & calcifed mitral annulus; LA mild-mod dilated; RA mildly dilated    FAMHx:  Family History  Problem Relation Age of Onset   Anesthesia problems Daughter    Cancer Mother    Lung disease Father        & heart disease   Colon cancer Sister    Sudden death Maternal Grandmother     SOCHx:   reports that [name] quit smoking about 9 years ago. His smoking use included cigars. [name] quit smokeless tobacco use about 9 years ago. [name] reports current alcohol use. [name] reports that [name] does not use drugs.  ALLERGIES:  Allergies  Allergen Reactions   Entresto [Sacubitril-Valsartan] Other (See Comments)    dizziness   Sacubitril Other (See Comments)     Dizziness    ROS: Pertinent items noted in HPI and remainder of comprehensive ROS otherwise negative.  HOME MEDS: Current Outpatient Medications  Medication Sig Dispense Refill   carvedilol (COREG) 6.25 MG tablet Take 1 tablet (6.25 mg total) by mouth 2 (two) times [name]. 180 tablet 3   ELIQUIS 5 MG TABS tablet Take 1 tablet by mouth twice [name] 180 tablet 1   empagliflozin (JARDIANCE) 10 MG TABS tablet Take 1 tablet (10 mg total) by mouth [name] before breakfast. 30 tablet 11   furosemide (LASIX) 40 MG tablet Take 40 mg by mouth in the morning.     gabapentin (NEURONTIN) 300 MG capsule Take 600 mg by mouth at bedtime.     glipiZIDE (GLUCOTROL XL) 10 MG 24 hr tablet Take 10 mg by mouth at bedtime.     metFORMIN (GLUCOPHAGE-XR) 500 MG 24 hr tablet Take 1,000 mg by mouth 2 (two) times [name].     ONETOUCH VERIO test strip 1 each [name].     rosuvastatin (CRESTOR) 20 MG tablet Take 10 mg by mouth every  other day. In the evening     No current facility-administered medications for this visit.    LABS/IMAGING: No results found for this or any previous  visit (from the past 48 hour(s)).  No results found.  VITALS: BP (!) 116/56 (BP Location: Left Arm, Patient Position: Sitting, Cuff Size: Normal)    Pulse 67    Ht 6\' 2"  (1.88 m)    Wt 225 lb (102.1 kg)    BMI 28.89 kg/m   EXAM: General appearance: alert and no distress Neck: no carotid bruit, no JVD and thyroid not enlarged, symmetric, no tenderness/mass/nodules Lungs: clear to auscultation bilaterally Heart: irregularly irregular rhythm Abdomen: soft, non-tender; bowel sounds normal; no masses,  no organomegaly Extremities: extremities normal, atraumatic, no cyanosis or edema Pulses: 2+ and symmetric Skin: Skin color, texture, turgor normal. No rashes or lesions Neurologic: Grossly normal Psych: Pleasant  EKG: Deferred  ASSESSMENT: Orthostatic hypotension-resolved New onset atrial fibrillation, CHADSVASC score of 5, status post DCCV (31/5176) Chronic systolic congestive heart failure - EF 40% (07/2017) Coronary artery disease status post three-vessel CABG in 2013 (LIMA to LAD, SVG to PDA and SVG to PLA) - patent grafts by cath in 08/2015 Hypertension-controlled Dyslipidemia Diabetes type 2 - better controlled Mild PVD-with normal ABI Hypotension/upper back pain - resolved Spinal stenosis - s/p surgery Trifascicular block OSA-back on BiPAP  PLAN: 1.   Mr. 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  [name] had 1 episode of some dizziness which may have been orthostatic hypotension.  [name] is on a diuretic and Jardiance.  [name] has labs with primary care provider believes [name] has a follow-up in December.  We will go ahead and get a lipid today since his last cholesterol was almost a year ago.  His last LDL was 127 on 20 mg rosuvastatin.  [name] may likely need additional therapy such as ezetimibe or PCSK9 inhibitor  to reach target.  Follow-up with me in 6 months or sooner as necessary.  [name], MD, [facility], [place] Director of the [facility]  Card[facility] Diplomate of the American Board of Clinical Lipidology Attending Cardiologist  Direct Dial: [phone]   Fax: [phone]  Website:  [email or web address] 08/14/2021, 9:03 AM

## 2021-08-14 NOTE — Patient Instructions (Signed)
°  Follow-Up: At [facility], you and your health needs are our priority.  As part of our continuing mission to provide you with exceptional heart care, we have created designated Provider Care Teams.  These Care Teams include your primary Cardiologist (physician) and Advanced Practice Providers (APPs -  Physician Assistants and Nurse Practitioners) who all work together to provide you with the care you need, when you need it.  We recommend signing up for the patient portal called "MyChart".  Sign up information is provided on this After Visit Summary.  MyChart is used to connect with patients for Virtual Visits (Telemedicine).  Patients are able to view lab/test results, encounter notes, upcoming appointments, etc.  Non-urgent messages can be sent to your provider as well.   To learn more about what you can do with MyChart, go to [email or web address].    Your next appointment:   6 month(s)  The format for your next appointment:   In Person  Provider:   [name], MD

## 2021-08-18 DIAGNOSIS — H26493 Other secondary cataract, bilateral: Secondary | ICD-10-CM | POA: Diagnosis not present

## 2021-08-18 DIAGNOSIS — H35373 Puckering of macula, bilateral: Secondary | ICD-10-CM | POA: Diagnosis not present

## 2021-08-18 DIAGNOSIS — E113292 Type 2 diabetes mellitus with mild nonproliferative diabetic retinopathy without macular edema, left eye: Secondary | ICD-10-CM | POA: Diagnosis not present

## 2021-08-18 DIAGNOSIS — H35033 Hypertensive retinopathy, bilateral: Secondary | ICD-10-CM | POA: Diagnosis not present

## 2021-08-18 DIAGNOSIS — H26491 Other secondary cataract, right eye: Secondary | ICD-10-CM | POA: Diagnosis not present

## 2021-08-22 ENCOUNTER — Other Ambulatory Visit: Payer: Self-pay | Admitting: *Deleted

## 2021-08-22 DIAGNOSIS — E785 Hyperlipidemia, unspecified: Secondary | ICD-10-CM

## 2021-08-22 MED ORDER — EZETIMIBE 10 MG PO TABS: 10.0000 mg | ORAL_TABLET | Freq: Every day | ORAL | 3 refills | Status: DC

## 2021-08-27 DIAGNOSIS — E11621 Type 2 diabetes mellitus with foot ulcer: Secondary | ICD-10-CM | POA: Diagnosis not present

## 2021-08-27 DIAGNOSIS — E1122 Type 2 diabetes mellitus with diabetic chronic kidney disease: Secondary | ICD-10-CM | POA: Diagnosis not present

## 2021-08-27 DIAGNOSIS — G4733 Obstructive sleep apnea (adult) (pediatric): Secondary | ICD-10-CM | POA: Diagnosis not present

## 2021-08-27 DIAGNOSIS — E1165 Type 2 diabetes mellitus with hyperglycemia: Secondary | ICD-10-CM | POA: Diagnosis not present

## 2021-08-27 DIAGNOSIS — F411 Generalized anxiety disorder: Secondary | ICD-10-CM | POA: Diagnosis not present

## 2021-08-27 DIAGNOSIS — E1140 Type 2 diabetes mellitus with diabetic neuropathy, unspecified: Secondary | ICD-10-CM | POA: Diagnosis not present

## 2021-08-27 DIAGNOSIS — I255 Ischemic cardiomyopathy: Secondary | ICD-10-CM | POA: Diagnosis not present

## 2021-08-27 DIAGNOSIS — M5432 Sciatica, left side: Secondary | ICD-10-CM | POA: Diagnosis not present

## 2021-08-27 DIAGNOSIS — I5022 Chronic systolic (congestive) heart failure: Secondary | ICD-10-CM | POA: Diagnosis not present

## 2021-08-27 DIAGNOSIS — I739 Peripheral vascular disease, unspecified: Secondary | ICD-10-CM | POA: Diagnosis not present

## 2021-08-27 DIAGNOSIS — I10 Essential (primary) hypertension: Secondary | ICD-10-CM | POA: Diagnosis not present

## 2021-08-27 DIAGNOSIS — E782 Mixed hyperlipidemia: Secondary | ICD-10-CM | POA: Diagnosis not present

## 2021-08-27 DIAGNOSIS — K5901 Slow transit constipation: Secondary | ICD-10-CM | POA: Diagnosis not present

## 2021-08-27 DIAGNOSIS — N401 Enlarged prostate with lower urinary tract symptoms: Secondary | ICD-10-CM | POA: Diagnosis not present

## 2021-08-27 DIAGNOSIS — I5033 Acute on chronic diastolic (congestive) heart failure: Secondary | ICD-10-CM | POA: Diagnosis not present

## 2021-09-26 DIAGNOSIS — H26491 Other secondary cataract, right eye: Secondary | ICD-10-CM | POA: Diagnosis not present

## 2021-09-30 ENCOUNTER — Encounter: Payer: Self-pay | Admitting: Internal Medicine

## 2021-10-01 NOTE — Telephone Encounter (Signed)
Would advise he address this with his PCP ? ?Dr Lemmie Evens ?

## 2021-10-02 DIAGNOSIS — E1165 Type 2 diabetes mellitus with hyperglycemia: Secondary | ICD-10-CM | POA: Diagnosis not present

## 2021-10-02 DIAGNOSIS — Z6828 Body mass index (BMI) 28.0-28.9, adult: Secondary | ICD-10-CM | POA: Diagnosis not present

## 2021-10-02 DIAGNOSIS — R5383 Other fatigue: Secondary | ICD-10-CM | POA: Diagnosis not present

## 2021-10-02 DIAGNOSIS — R194 Change in bowel habit: Secondary | ICD-10-CM | POA: Diagnosis not present

## 2021-10-02 DIAGNOSIS — D638 Anemia in other chronic diseases classified elsewhere: Secondary | ICD-10-CM | POA: Diagnosis not present

## 2021-10-02 DIAGNOSIS — Z1212 Encounter for screening for malignant neoplasm of rectum: Secondary | ICD-10-CM | POA: Diagnosis not present

## 2021-10-02 DIAGNOSIS — D520 Dietary folate deficiency anemia: Secondary | ICD-10-CM | POA: Diagnosis not present

## 2021-10-02 DIAGNOSIS — I10 Essential (primary) hypertension: Secondary | ICD-10-CM | POA: Diagnosis not present

## 2021-10-02 DIAGNOSIS — D510 Vitamin B12 deficiency anemia due to intrinsic factor deficiency: Secondary | ICD-10-CM | POA: Diagnosis not present

## 2021-10-13 DIAGNOSIS — G8929 Other chronic pain: Secondary | ICD-10-CM | POA: Diagnosis not present

## 2021-10-13 DIAGNOSIS — Z6829 Body mass index (BMI) 29.0-29.9, adult: Secondary | ICD-10-CM | POA: Diagnosis not present

## 2021-10-13 DIAGNOSIS — M25562 Pain in left knee: Secondary | ICD-10-CM | POA: Diagnosis not present

## 2021-10-16 ENCOUNTER — Other Ambulatory Visit: Payer: Self-pay | Admitting: Internal Medicine

## 2021-10-29 ENCOUNTER — Encounter: Payer: Self-pay | Admitting: Internal Medicine

## 2021-11-10 DIAGNOSIS — R2681 Unsteadiness on feet: Secondary | ICD-10-CM | POA: Diagnosis not present

## 2021-11-10 DIAGNOSIS — Z7409 Other reduced mobility: Secondary | ICD-10-CM | POA: Diagnosis not present

## 2021-11-10 DIAGNOSIS — R29898 Other symptoms and signs involving the musculoskeletal system: Secondary | ICD-10-CM | POA: Diagnosis not present

## 2021-11-10 DIAGNOSIS — Z789 Other specified health status: Secondary | ICD-10-CM | POA: Diagnosis not present

## 2021-11-10 DIAGNOSIS — L97529 Non-pressure chronic ulcer of other part of left foot with unspecified severity: Secondary | ICD-10-CM | POA: Diagnosis not present

## 2021-11-10 DIAGNOSIS — E11621 Type 2 diabetes mellitus with foot ulcer: Secondary | ICD-10-CM | POA: Diagnosis not present

## 2021-11-12 ENCOUNTER — Encounter: Payer: Self-pay | Admitting: Internal Medicine

## 2021-11-14 NOTE — Telephone Encounter (Signed)
Ok to remain on the increased dose of lasix, decrease salt intake and elevated your feet at the level of the heart as much as possible.  Dr Lemmie Evens

## 2021-11-17 ENCOUNTER — Ambulatory Visit (INDEPENDENT_AMBULATORY_CARE_PROVIDER_SITE_OTHER): Payer: Medicare Other | Admitting: Internal Medicine

## 2021-11-17 ENCOUNTER — Encounter: Payer: Self-pay | Admitting: Internal Medicine

## 2021-11-17 VITALS — BP 125/73 | HR 66 | Ht 73.0 in | Wt 230.4 lb

## 2021-11-17 DIAGNOSIS — R600 Localized edema: Secondary | ICD-10-CM | POA: Diagnosis not present

## 2021-11-17 DIAGNOSIS — Z951 Presence of aortocoronary bypass graft: Secondary | ICD-10-CM

## 2021-11-17 DIAGNOSIS — I5023 Acute on chronic systolic (congestive) heart failure: Secondary | ICD-10-CM

## 2021-11-17 DIAGNOSIS — I5022 Chronic systolic (congestive) heart failure: Secondary | ICD-10-CM | POA: Diagnosis not present

## 2021-11-17 NOTE — Patient Instructions (Signed)
Medication Instructions:  CONTINUE current meds   *If you need a refill on your cardiac medications before your next appointment, please call your pharmacy*   Lab Work: Non-Fasting lab work today -- CBC, CMET, BNP  If you have labs (blood work) drawn today and your tests are completely normal, you will receive your results only by: [place] (if you have MyChart) OR A paper copy in the mail If you have any lab test that is abnormal or we need to change your treatment, we will call you to review the results.   Testing/Procedures: Your physician has requested that you have an echocardiogram. Echocardiography is a painless test that uses sound waves to create images of your heart. It provides your doctor with information about the size and shape of your heart and how well your heart's chambers and valves are working. This procedure takes approximately one hour. There are no restrictions for this procedure. -- can be done at [facility] or [employer] or [place] on [address]: At [facility], you and your health needs are our priority.  As part of our continuing mission to provide you with exceptional heart care, we have created designated Provider Care Teams.  These Care Teams include your primary Cardiologist (physician) and Advanced Practice Providers (APPs -  Physician Assistants and Nurse Practitioners) who all work together to provide you with the care you need, when you need it.  We recommend signing up for the patient portal called "MyChart".  Sign up information is provided on this After Visit Summary.  MyChart is used to connect with patients for Virtual Visits (Telemedicine).  Patients are able to view lab/test results, encounter notes, upcoming appointments, etc.  Non-urgent messages can be sent to your provider as well.   To learn more about what you can do with MyChart, go to [email or web address].    Your next appointment:   1 month with Dr.  Debara Pickett or NP/PA

## 2021-11-17 NOTE — Telephone Encounter (Signed)
Called patient - scheduled for visit today at 3:30pm with Dr. Debara Pickett

## 2021-11-17 NOTE — Progress Notes (Signed)
OFFICE NOTE  Chief Complaint:  Shortness of breath, edema  Primary Care Physician: [name] [name], MD  HPI:  [name] [name] is a pleasant 76 year old gentleman previously followed by Dr. Rollene Fare. His past medical history is significant for coronary artery disease. In 2013 he underwent multivessel CABG for an ischemic cardiomyopathy. EF was 20-25% prior to surgery however post surgery his EF had improved up to 45-50%. Recently his EF was 40-45% by echo in May of 2014. There was mild mitral regurgitation, mild to moderately dilated left atrium and mild LVH. [name] has been describing some anxiety. He also reports some pain in his legs however underwent Dopplers in September 2014 which showed preserved ABIs bilaterally a 1.0 on the right and 1.1 on the left. The bilateral peroneal and posterior tibial arteries were occluded. He reports some optimal control of his diabetes. His A1c was 7.2. He is concerned about the cost of taking Tradjenta. He is followed by Dr. Elyse Hsu.   He was recently seen in the emergency room and he can hospital. There he presented with hypotension and was given 1 L normal saline and his symptoms improved. He had been having dizziness as well as some upper back pain into the left shoulder blade with exertion recently. The symptoms are worse particularly when climbing up ladders or going up stairs and are relieved at rest. After that hospitalization it was recommended that he discontinue his ACE inhibitor and beta blocker, and his blood pressure appears improved today up to 110/60.  [name] [name] returns today for followup of his stress test. This is interpreted as nonischemic with an EF of 44%. He reports still feeling very fatigued and extremely short of breath when doing activities. He says that the other day he tried to hit golf balls and felt that he was totally exhausted after just an hour. He reports his blood pressure has improved somewhat off of all medications  however remains low. He has had symptoms of orthostatic hypotension in the past. His symptoms do feel similar to prior to his bypass surgery. He also feels that he was much better after surgery than he is now and that his symptoms have been going on for the past 2 months.  At his last office visit, I recommended that [name] [name] have a repeat cardiac catheterization (08/2013). This demonstrated the following:  Impression:  1. 2 vessel native CAD with occluded LAD in the mid-vessel  2. Patent LIMA-LAD, SVG to PLA and SVG to PDA grafts with TIMI III flow  3. LVEDP = 13 mmHg  4. Random PVC's were noted  Based on these findings, I could not find any new cardiac cause of his symptoms. I suspect that some of his fatigue could be related to labile blood sugars. He says unexplained hypotension but is normotensive off of medications.  [name] returns today for followup. He now reports feeling better since he had change in his medications. He is established with Dr. Buddy Duty who adjusted his diabetes medicines and stopped his Invokana. His shortness of breath and fatigue in both improved. He is now been expected some problems with left heel pain. He was found to have some spinal stenosis but has had improvement with inserts in his shoes.  I saw [name] [name] back in the office today. He has successfully underwent back surgery earlier this year which she says initially was much improved, however he says he subsequently developed more pain going down his legs. This sounds  like it's neuropathic pain, but is reluctant to try medication for it. He also significantly fatigue. This could be due to a number of etiologies. 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 Also, he is noted to have a history of obstructive sleep apnea. He was placed on BiPAP, but has not been compliant with that due to difficulty wearing the mask.  He also never had follow-up of his sleep study.  [name] returns today for follow-up. He reports he's had worsening dyspnea and fatigue. He says that he can hardly sleep at night. He is struggling with significant anxiety. 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 He's not been wearing the BiPAP due to significant anxiety and difficulty sleeping. He feels like he gets smothered with his machine. Communication between Dr. Radford Pax and Dr. Quintin Alto led to the starting of Celexa as well as Klonopin to use as needed at night. He says that it does give him about 6 hours of sleep. He has not been using his BiPAP as instructed. He is reportedly had more worsening shortness of breath. He was seen in the ER for this and it was thought this was due [email or web address] with BiPAP. His BNP however is elevated over 300. Today in the office he says that he's had more swelling and more abdominal fullness as well as leg edema.  I had the pleasure seeing [name] [name] back today in the office. He seems to respond nicely to Lasix. I started him on 40 mg daily and while I was out of town my partner reviewed his lab work which included an elevated BNP over 300. He increased his Lasix to 40 mg twice a day. [name] [name] says he's had a significant improvement in his breathing. The BNP now is in the mid 200s. Renal function is stable based on labs today. Unfortunately, his echocardiogram does show a new cardiomyopathy with EF 30-35%. It should be noted that he felt "awful" on blood pressure/heart failure medicines and had taken himself off of ACE inhibitor and beta blocker in the past. The echo however does suggest inferior and anterolateral wall motion abnormalities which are new and could indicate graft dysfunction. I performed his last heart catheterization in 2015 which showed all 3 grafts patent.  [name] [name] returns today for follow-up of his left heart catheterization. I  perform this a few weeks ago and he had patent bypass grafts however LV function is reduced. Cardiac output is also reduced. Filling pressures were mildly elevated. He reports since discharge that he's had some improvement of his symptoms. He was started on low-dose beta blocker but he does report fatigue. When I asked him how they compared to previously he said he thinks attack sleep better since he started on the beta blocker but not back to what he thinks his baseline should be. He continues to have problems with left leg pain. He had ultrasound of the left leg which were unrevealing. He also had nerve conduction studies which I sent him for which were not suggestive of neuropathy. His symptoms were worse while lying flat on the Cath Lab table and I suspect this could be again coming from his back and he may need repeat orthopedic evaluation. His primary care provider started him on Celexa recently as well for anxiety.  12/23/2015  [name] [name] was seen back in  the office today in follow-up. Overall he seems to be doing well. He's tolerating low-dose carvedilol and has had some very infrequent morning dizziness. He was previously on Lasix 40 mg twice a day but ran out of the medicine about 8 days ago. He's not had any worsening weight gain or swelling nor worsening shortness of breath over the past week. This could be that he's had some improvement in his cardiomyopathy. Nevertheless, I would like him to be on some Lasix at least until we can determine whether or not he is euvolemic with lab work.  05/27/2016  [name] [name] returns today for follow-up. He's gained about 6 pounds of weight since I last saw him. He denies any worsening shortness of breath or orthostatic dizziness. He saw Dr. Radford Pax in August for following of his obstructive sleep apnea. He remains physically active and denies any chest pain. He recently had a repeat echo in October 2017 which showed a small improvement in LV function with EF up  to 35-40%. Heart failure regimen includes aspirin, carvedilol, Crestor, and spironolactone. He is not currently on a ARB is he's had orthostatic symptoms in the past although that his generally resolved. His creatinine is normal.  08/24/2016  I saw [name] [name] today in follow-up. He again gained about 4 pounds over the past month. He felt that the weight gain was heart failure related because he got more short of breath particularly walking up stairs. Based on that he increased his Lasix back to 40 mg daily. He notes that his urine is been darker and his urination is not necessarily improved. He's also been more dizzy. He says he gets somewhat presyncopal with change in position. Lab work 12 days ago indicated a stable creatinine however. Despite this, I feel that he may be somewhat presyncopal perhaps on too high of a dose of diuretics. He is also on Entresto 24/26 and Aldactone 12.5 mg daily. He also complained of some pain across the back between the shoulder blades. He said this got better with resting and drinking water. It's difficult to say whether that it was the water or perhaps resting from his activities. I cannot exclude that this could be angina. His EKG however is reassuring today.  09/21/2016  Mr. Coutts was seen today in follow-up. I decreased his Lasix, however he does not feel any change in his dizziness. There is no evidence of presyncope. He has gained about 4 pounds which I suspect is some fluid retention. Blood pressure is well-controlled today 118/56.  02/26/2017  Mr. Duell returns today for follow-up. He had more recent dizziness and it seemed to worsen on Entresto. He discontinued that and feels that he's now much better. Overall he says he feels well. He is not on ACE inhibitor or ARB but remains on carvedilol. Is also on Lasix 40 mg daily. Blood pressure is stable 122/52.  08/10/2017  Mr. Rhoads returns for follow-up.  Overall he seems to be feeling very well.  Denies any chest  pain or worsening shortness of breath.  He gets occasional dizziness but that is much improved.  Of note an EKG was performed again today and there is a suggestion that there may be underlying atrial flutter.  Heart rate was 74 and EKG looks very similar to his prior EKG.  The computer has interpreted a sinus rhythm with first-degree AV block and bifascicular block.  I compared EKG to his previous EKG last August which looks similar however it is distinctly different from  the EKG last of February.  He is asymptomatic, but not anticoagulated.  Otherwise, labs reviewed from October indicate total cholesterol 134, HDL 36 LDL 67 and triglycerides 157.  Hemoglobin A1c of 7.6 and serum creatinine 0.9.  08/30/2017  Mr. Fettig returns today for follow-up of his studies.  He underwent an echocardiogram to evaluate for change in LVEF, but more importantly to rule out atrial flutter.  His EKG was abnormal and suggested possible atrial flutter, however he was asymptomatic.  The echo did not show any evidence of atrial flutter.  LVEF was stable at 40%.  He also underwent a home 48-hour Holter monitor.  This demonstrated sinus rhythm with PACs and PVCs, but no evidence of atrial fibrillation or flutter.  Remains asymptomatic.  His only other complaint today is chronic leg pain.  He was seen by Dr. Gwenlyn Found and evaluated with lower extremity arterial Dopplers.  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  A referral to neurosurgery was suggested but not placed.  01/24/2018  Mr. Klas returns today for follow-up.  Overall he says he is doing really well.  He underwent 2 back injections and has had improvement in his back pain and leg pain.  He had an episode in June where he became somewhat dehydrated.  He presented to the ER with orthostatic hypotension.  His Lasix was cut back to 20 mg daily.  Weight is stayed stable effect is been improved from 252-246.  He says he is actually had  enough energy to play golf recently.  08/01/2018  Mr. Mcfadden seen today in routine follow-up.  He has some occasional dizziness which is persistent.  He says he gets a little lightheaded when he urinates.  He has been off of tamsulosin with no specific benefit and now is taking the medication every third day.  Blood pressure is stable.  Weight is stable.  He denies any chest pain or worsening shortness of breath with exertion.  His last echo showed an EF of 40% which is stable if not slightly improved from an echo in 2017.  Unfortunately he could not tolerate Entresto and remains on carvedilol.  He endorses NYHA class I-II heart failure symptoms.  Recent lab work showed total cholesterol 151, HDL 39, LDL 79 triglycerides 166.  Hemoglobin A1c of 7.3.  03/30/2019  Mr. Dulay seen today in follow-up.  Overall he is doing well and has no complaints.  He has not recently struggled with any of the dizziness he has had previously.  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  In the past he has had EKG changes that were questionable for possible atrial flutter however monitoring as well as an echocardiogram failed to show any evidence of atrial flutter or fibrillation.  This is now clear finding of atrial fibrillation on EKG with an irregularly irregular rhythm with no evidence of P waves.  He reports being asymptomatic with this.  05/12/2019   Mr. Mario returns today for follow-up of his A. fib.  He reports he has been compliant with Eliquis.  He says he had a couple episodes of loose stools.  He thought it might be due to the medicine but this seems quite atypical.  He stopped some of his stool softener/bulking agents.  Weight is down about 3 pounds.  He increased his Lasix because he felt like it was a little wheezy.  Possibly could have had some more heart failure because  he is in A. fib and does have a history of heart failure.  EKG again shows atrial  flutter/fibrillation with variable AV response at 74.  Since this is presumably persistent at this point we discussed an elective cardioversion and he is agreeable to this.  06/13/2019  Mr. Debruhl was seen today in follow-up.  He underwent cardioversion by myself in November.  Since then he has felt fairly well.  His EKG today shows that he is maintaining sinus rhythm.  Blood pressure was 119/63 however he has had some positional dizziness at home.  His daughter notes he has had some blood pressures with low diastolics less than 50 and he does feel some dizziness.  He also feels some fatigue when exerting himself a lot.  I suspect this could be due to hypotension.  Is possible also that his EF has improved now that he is back in sinus and that he may be over diuresed being on both Lasix and Aldactone.  09/15/2019  RandallAttwood returns for follow-up.  He reports improvement in his orthostatic symptoms after stopping Aldactone and his PCP also stopped tamsulosin.  He is now on some saw palmetto for BPH.  Unfortunately he does get a little bit more short of breath and I wonder if this is due to need for more diuretic.  His LVEF was 40% last year however has not been reassessed.  I would like to get an echo to recheck that.  03/25/2020  Mr. Hartwig is seen today for 75-monthfollow-up.  He denies any further orthostatic symptoms.  He was not feeling well on Aldactone.  He was also taken off tamsulosin.  He is only on carvedilol for heart failure and unfortunately his LVEF declined further in March down to 5 to 40%, previously 40 to 45%.  I had increased his Lasix due to signs of increased LV filling pressure however then he became a little bit prerenal.  His Lasix was then decreased to 40 mg every morning and 20 mg every afternoon.  Since then he has been asymptomatic with heart failure at most with NYHA class II symptoms.  Is been struggling with issues with the CPAP and that the humidifier broke.  This is a  [employer] device and he was told by the company that they are no longer manufacturing them rather they are making ventilators because of COVID-19.  Lipid profile in June 2021 showed total cholesterol 168, HDL 32, triglycerides 106 and 116.  A1c was 7.7 and both remain above goal.  09/20/2020  [name] [name] today for follow-up.  Overall he seems to be doing pretty well.  He struggled with a small wound under his left fifth metatarsal.  He is going to the wound center on April 1.  EF had declined somewhat down to 40 to 45%.  Unfortunately has been intolerant of Entresto and losartan more recently causing worsening fatigue.  His options for heart failure medications have been limited.  He is a diabetic with recent A1c at 6.8.  We discussed today the possibility of adding an SGLT2 inhibitor based on positive data in heart failure and diabetes.  01/01/2021  [name] [name] seen today in follow-up.  He seems somewhat desponded today.  He says he has a lot going on and and unfortunately has been dealing with wound that is nonhealing in his left leg.  He said that even working with the wound center he has developed osteomyelitis.  There talk about possible amputation.  He has been  noted to have some mild reduction in blood flow to that leg.  He is scheduled to actually have a retrograde angiogram tomorrow with [name].  He is also going to see Dr. Baxter Flattery with infectious diseases.  Hopefully they will be able to get control of this before having to consider an amputation.  He does say that he feels better though.  He had come off of alfuzosin which she was taking by his urologist and he now reports being less dizzy.  Also his blood sugars are significantly lower after starting the Jardiance.  08/14/2021  [name] [name] returns today for follow-up.  He seems to be doing much better.  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  He still struggles with some balance issues but is done better.  He had 1 episode where he became dizzy or lightheaded in January when he was playing golf in [place].  This seemed to resolve with rest and drinking some water.  He has done well with the Jardiance.  11/17/2021  Mr. Mankins is seen today as an acute add-on for shortness of breath.  He reports several weeks of worsening shortness of breath and difficulty breathing.  This past weekend it was difficult for him to sleep.  He called and noted that he increased his Lasix from 40 to 60 mg daily.  I advised him to stay on that because of his meeting today.  He reports over the past couple days he has started to diurese a little more.  He is swelling has improved but his breathing is still somewhat labored and he is fatigued.  Blood pressure was normal today.  Oxygen at saturation 96%.  He had repeat lipids in March, which showed a total cholesterol 172, triglycerides 86, HDL 38 and LDL lower at 118.  EKG today shows normal sinus rhythm.  PMHx:  Past Medical History:  Diagnosis Date   Arthritis    Knee L - probably   Atrial fibrillation (HCC)    BPH (benign prostatic hyperplasia)    CAD (coronary artery disease) 07/06/2011   CHF (congestive heart failure) (HCC)    Coronary artery disease    Diabetes mellitus    Frequency of urination    Heart failure (HCC)    High cholesterol    History of nuclear stress test 09/2010   dipyridamole; mild perfusion defect due to attenuation with mild superimposed ischemia in apical septal, apical, apical inferior & apical lateral regions; rest LV enlarged in size; prominent gut uptake in infero-apical region; no significant ischemia demonstrated; low risk scan    HNP (herniated nucleus pulposus), lumbar    Hypertension    Ischemic cardiomyopathy    LV dysfunction 07/06/2011   Nocturia    Right bundle branch block    S/P CABG (coronary artery bypass graft)  08/03/2011   x3; LIMA to LAD,, SVG to PDA, SVG to posterolateral branch of RCA; Dr. Ellison Hughs   Sleep apnea    sleep study 10/2010- AHI during total sleep 32.1/hr and during REM 62.3/hr (severe sleep apnea)unable to tolerate c pap   Spinal stenosis of lumbar region    Stroke [facility])    [place]     Past Surgical History:  Procedure Laterality Date   ABDOMINAL AORTOGRAM W/LOWER EXTREMITY Left 11/14/2020   Procedure: ABDOMINAL AORTOGRAM W/LOWER EXTREMITY;  Surgeon: [name], MD;  Location: [place] [facility];  Service: Cardiovascular;  Laterality: Left;   ABDOMINAL AORTOGRAM W/LOWER EXTREMITY N/A 01/02/2021   Procedure: ABDOMINAL AORTOGRAM W/LOWER EXTREMITY;  Surgeon: [name], MD;  Location: [place] [facility];  Service: Cardiovascular;  Laterality: N/A;   AMPUTATION Left 03/20/2021   Procedure: Left foot 5th ray amputation;  Surgeon: [name], MD;  Location: [place];  Service: Orthopedics;  Laterality: Left;   CARDIAC CATHETERIZATION  01/2011   ischemic cardiomyopathy 30-35%, multivessel CAD (Dr. [name])    CARDIAC CATHETERIZATION  09/13/2015   Procedure: Right/Left Heart Cath and Coronary/Graft Angiography;  Surgeon: [name], MD;  Location: [place] [facility];  Service: Cardiovascular;;   CARDIOVERSION N/A 05/16/2019   Procedure: CARDIOVERSION;  Surgeon: [name], MD;  Location: [place];  Service: Cardiovascular;  Laterality: N/A;   Colonscopy     CORONARY ARTERY BYPASS GRAFT  08/03/2011   Procedure: CORONARY ARTERY BYPASS GRAFTING (CABG);  Surgeon: [name], MD;  Location: [name];  Service: Open Heart Surgery;  Laterality: N/A;  CABG times three using right saphenous vein and left mammary artery usisng endoscope.   LEFT HEART CATHETERIZATION WITH CORONARY/GRAFT ANGIOGRAM N/A 09/20/2013   Procedure: LEFT HEART CATHETERIZATION WITH [name];  Surgeon: [name], MD;  [facility]: [facility] C[facility];  Service: Cardiovascular;   Laterality: N/A;   Lower Extremity Arterial Doppler  03/16/2013   bilat ABIs demonstated normal values; R runoff - posterior tibial & anterior tibial arteries occluded; L runoff - peroneal & posterior tibial arteries occluded, anterior tibial artery appears occluded   LUMBAR LAMINECTOMY/DECOMPRESSION MICRODISCECTOMY N/A 09/12/2014   Procedure: LUMBAR DECOMPRESSION L3-L4, L4-L5, MICRODISCECTOMY L4-L5;  Surgeon: [name], MD;  Location: [facility];  Service: Orthopedics;  Laterality: N/A;   PERIPHERAL VASCULAR BALLOON ANGIOPLASTY  11/14/2020   Procedure: PERIPHERAL VASCULAR BALLOON ANGIOPLASTY;  Surgeon: [name], MD;  Location: [place] [facility];  Service: Cardiovascular;;   PERIPHERAL VASCULAR BALLOON ANGIOPLASTY Left 01/02/2021   Procedure: PERIPHERAL VASCULAR BALLOON ANGIOPLASTY;  Surgeon: [name], MD;  Location: [place] [facility];  Service: Cardiovascular;  Laterality: Left;  Failed PTA   Pilonidal Cyst removed     TRANSTHORACIC ECHOCARDIOGRAM  11/10/2012   EF 40-45%, mild LVH, mild hypokinesis of anteroseptal myocardium, grade 1 diastolic dysfunction; mild MR & calcifed mitral annulus; LA mild-mod dilated; RA mildly dilated    FAMHx:  Family History  Problem Relation Age of Onset   Anesthesia problems Daughter    Cancer Mother    Lung disease Father        & heart disease   Colon cancer Sister    Sudden death Maternal Grandmother     SOCHx:   reports that he quit smoking about 10 years ago. His smoking use included cigars. He quit smokeless tobacco use about 9 years ago. He reports current alcohol use. He reports that he does not use drugs.  ALLERGIES:  Allergies  Allergen Reactions   Entresto [Sacubitril-Valsartan] Other (See Comments)    dizziness   Sacubitril Other (See Comments)     Dizziness    ROS: Pertinent items noted in HPI and remainder of comprehensive ROS otherwise negative.  HOME MEDS: Current Outpatient Medications  Medication Sig  Dispense Refill   carvedilol (COREG) 6.25 MG tablet Take 1 tablet by mouth twice daily 180 tablet 0   ELIQUIS 5 MG TABS tablet Take 1 tablet by mouth twice daily 180 tablet 1   empagliflozin (JARDIANCE) 10  MG TABS tablet Take 1 tablet (10 mg total) by mouth daily before breakfast. 30 tablet 11   ezetimibe (ZETIA) 10 MG tablet Take 1 tablet (10 mg total) by mouth daily. 90 tablet 3   furosemide (LASIX) 40 MG tablet Take 60 mg by mouth in the morning.     gabapentin (NEURONTIN) 300 MG capsule Take 600 mg by mouth at bedtime.     glipiZIDE (GLUCOTROL XL) 10 MG 24 hr tablet Take 10 mg by mouth at bedtime.     metFORMIN (GLUCOPHAGE-XR) 500 MG 24 hr tablet Take 1,000 mg by mouth 2 (two) times daily.     ONETOUCH VERIO test strip 1 each daily.     rosuvastatin (CRESTOR) 20 MG tablet Take 10 mg by mouth every other day. In the evening     No current facility-administered medications for this visit.    LABS/IMAGING: No results found for this or any previous visit (from the past 48 hour(s)).  No results found.  VITALS: BP 125/73   Pulse 66   Ht '6\' 1"'$  (1.854 m)   Wt 230 lb 6.4 oz (104.5 kg)   SpO2 96%   BMI 30.40 kg/m   EXAM: General appearance: alert and no distress Neck: JVD - 5 cm above sternal notch, no carotid bruit, and thyroid not enlarged, symmetric, no tenderness/mass/nodules Lungs: dullness to percussion bibasilar Heart: regular rate and rhythm Abdomen: soft, non-tender; bowel sounds normal; no masses,  no organomegaly Extremities: edema trace bilateral sock line Pulses: 2+ and symmetric Skin: Skin color, texture, turgor normal. No rashes or lesions Neurologic: Grossly normal Psych: Pleasant  EKG: Normal sinus rhythm at 66, RBBB-personally reviewed  ASSESSMENT: Acute on chronic systolic congestive heart failure Orthostatic hypotension-resolved New onset atrial fibrillation, CHADSVASC score of 5, status post DCCV (81/0175) Chronic systolic congestive heart failure - EF  40-45%% (2021) Coronary artery disease status post three-vessel CABG in 2013 (LIMA to LAD, SVG to PDA and SVG to PLA) - patent grafts by cath in 08/2015 Hypertension-controlled Dyslipidemia Diabetes type 2 - better controlled Mild PVD-with normal ABI Hypotension/upper back pain - resolved Spinal stenosis - s/p surgery Trifascicular block OSA-back on BiPAP  PLAN: 1.   Mr. Patlan appears to be having acute on chronic systolic congestive heart failure.  He has had shortness of breath, fatigue and cough, worse when laying down as well as lower extremity swelling.  Increase his Lasix from 40 to 60 mg daily with some improvement.  I like to get labs today including a metabolic profile, BNP and CBC.  He denies any chest pain.  We will also repeat an echo since his last study was 2 years ago.  If his LVEF has further declined or there are regional wall motion abnormalities.  He may need repeat cardiac catheterization.  Fortunately does not show any evidence of atrial fibrillation today.  I may need to further increase his diuretic.  He will need a close follow-up probably in about a month.  [name], MD, [facility], [place] Director of the [facility]  [facility] Diplomate of the American Board of Clinical Lipidology Attending Cardiologist  Direct Dial: [phone]  Fax: [phone]  Website:  [email or web address] [email or web address] Dalia 11/17/2021, 3:50 PM

## 2021-11-18 LAB — COMPREHENSIVE METABOLIC PANEL
ALT: 13 IU/L (ref 0–44)
AST: 13 IU/L (ref 0–40)
Albumin/Globulin Ratio: 1.3 (ref 1.2–2.2)
Albumin: 4 g/dL (ref 3.7–4.7)
Alkaline Phosphatase: 147 IU/L — ABNORMAL HIGH (ref 44–121)
BUN/Creatinine Ratio: 19 (ref 10–24)
BUN: 23 mg/dL (ref 8–27)
Bilirubin Total: 1.2 mg/dL (ref 0.0–1.2)
CO2: 24 mmol/L (ref 20–29)
Calcium: 9.5 mg/dL (ref 8.6–10.2)
Chloride: 103 mmol/L (ref 96–106)
Creatinine, Ser: 1.22 mg/dL (ref 0.76–1.27)
Globulin, Total: 3.1 g/dL (ref 1.5–4.5)
Glucose: 167 mg/dL — ABNORMAL HIGH (ref 70–99)
Potassium: 4.4 mmol/L (ref 3.5–5.2)
Sodium: 142 mmol/L (ref 134–144)
Total Protein: 7.1 g/dL (ref 6.0–8.5)
eGFR: 61 mL/min/{1.73_m2} (ref >59)

## 2021-11-18 LAB — CBC
Hematocrit: 38.1 % (ref 37.5–51.0)
Hemoglobin: 12.6 g/dL — ABNORMAL LOW (ref 13.0–17.7)
MCH: 28.1 pg (ref 26.6–33.0)
MCHC: 33.1 g/dL (ref 31.5–35.7)
MCV: 85 fL (ref 79–97)
Platelets: 192 10*3/uL (ref 150–450)
RBC: 4.49 x10E6/uL (ref 4.14–5.80)
RDW: 15 % (ref 11.6–15.4)
WBC: 5.8 10*3/uL (ref 3.4–10.8)

## 2021-11-18 LAB — BRAIN NATRIURETIC PEPTIDE: BNP: 501.5 pg/mL — ABNORMAL HIGH (ref 0.0–100.0)

## 2021-11-21 ENCOUNTER — Other Ambulatory Visit: Payer: Self-pay | Admitting: *Deleted

## 2021-11-21 DIAGNOSIS — I5023 Acute on chronic systolic (congestive) heart failure: Secondary | ICD-10-CM

## 2021-11-26 DIAGNOSIS — R29898 Other symptoms and signs involving the musculoskeletal system: Secondary | ICD-10-CM | POA: Diagnosis not present

## 2021-11-26 DIAGNOSIS — R2681 Unsteadiness on feet: Secondary | ICD-10-CM | POA: Diagnosis not present

## 2021-11-26 DIAGNOSIS — Z789 Other specified health status: Secondary | ICD-10-CM | POA: Diagnosis not present

## 2021-11-26 DIAGNOSIS — L97529 Non-pressure chronic ulcer of other part of left foot with unspecified severity: Secondary | ICD-10-CM | POA: Diagnosis not present

## 2021-11-26 DIAGNOSIS — Z7409 Other reduced mobility: Secondary | ICD-10-CM | POA: Diagnosis not present

## 2021-11-26 DIAGNOSIS — E11621 Type 2 diabetes mellitus with foot ulcer: Secondary | ICD-10-CM | POA: Diagnosis not present

## 2021-11-30 NOTE — Progress Notes (Unsigned)
GI Office Note    Referring Provider: [name], MD Primary Care Physician:  [name], MD  Primary GI: Dr. [name]  Chief Complaint   No chief complaint on file.    History of Present Illness   [name] [name] is a 76 y.o. male presenting today at the request of [name], [name], MD for ***colonoscopy, positive hemosure.   No evidence of prior colonoscopy or EGD via chart review or Care Everywhere.  Recent labs 11/17/21 - Alk Phos 147, Hgb 12.6 (Hgb 10.9-13 since April 2022).   Today: Anemia Patient presents for evaluation of anemia. Anemia was found by routine CBC. It has been present for 1  year . Associated signs & symptoms: {Symptoms:12350}. Positive hemosure.   Melena? BRBPR? Constipation Diarrhea Fatigue SOB Chest pain Weight loss Early satiety Lack of appetite? Reflux? Hematemesis?  Family hx colon cancer?   Past Medical History:  Diagnosis Date   Arthritis    Knee L - probably   Atrial fibrillation (HCC)    BPH (benign prostatic hyperplasia)    CAD (coronary artery disease) 07/06/2011   CHF (congestive heart failure) (HCC)    Coronary artery disease    Diabetes mellitus    Frequency of urination    Heart failure (HCC)    High cholesterol    History of nuclear stress test 09/2010   dipyridamole; mild perfusion defect due to attenuation with mild superimposed ischemia in apical septal, apical, apical inferior & apical lateral regions; rest LV enlarged in size; prominent gut uptake in infero-apical region; no significant ischemia demonstrated; low risk scan    HNP (herniated nucleus pulposus), lumbar    Hypertension    Ischemic cardiomyopathy    LV dysfunction 07/06/2011   Nocturia    Right bundle branch block    S/P CABG (coronary artery bypass graft) 08/03/2011   x3; LIMA to LAD,, SVG to PDA, SVG to posterolateral branch of RCA; Dr. Wayland Salinas   Sleep apnea    sleep study 10/2010- AHI during total sleep 32.1/hr and during REM 62.3/hr  (severe sleep apnea)unable to tolerate c pap   Spinal stenosis of lumbar region    Stroke [facility])    [place]     Past Surgical History:  Procedure Laterality Date   ABDOMINAL AORTOGRAM W/LOWER EXTREMITY Left 11/14/2020   Procedure: ABDOMINAL AORTOGRAM W/LOWER EXTREMITY;  Surgeon: Cephus Shelling, MD;  Location: [facility] INVASIVE CV LAB;  Service: Cardiovascular;  Laterality: Left;   ABDOMINAL AORTOGRAM W/LOWER EXTREMITY N/A 01/02/2021   Procedure: ABDOMINAL AORTOGRAM W/LOWER EXTREMITY;  Surgeon: Cephus Shelling, MD;  Location: [facility] INVASIVE CV LAB;  Service: Cardiovascular;  Laterality: N/A;   AMPUTATION Left 03/20/2021   Procedure: Left foot 5th ray amputation;  Surgeon: Toni Arthurs, MD;  Location: [facility] OR;  Service: Orthopedics;  Laterality: Left;   CARDIAC CATHETERIZATION  01/2011   ischemic cardiomyopathy 30-35%, multivessel CAD (Dr. Bishop Limbo)    CARDIAC CATHETERIZATION  09/13/2015   Procedure: Right/Left Heart Cath and Coronary/Graft Angiography;  Surgeon: [name], MD;  Location: [facility] INVASIVE CV LAB;  Service: Cardiovascular;;   CARDIOVERSION N/A 05/16/2019   Procedure: CARDIOVERSION;  Surgeon: [name], MD;  Location: [facility] ENDOSCOPY;  Service: Cardiovascular;  Laterality: N/A;   Colonscopy     CORONARY ARTERY BYPASS GRAFT  08/03/2011   Procedure: CORONARY ARTERY BYPASS GRAFTING (CABG);  Surgeon: Alleen Borne, MD;  Location: [facility] OR;  Service: Open Heart Surgery;  Laterality: N/A;  CABG times  three using right saphenous vein and left mammary artery usisng endoscope.   LEFT HEART CATHETERIZATION WITH CORONARY/GRAFT ANGIOGRAM N/A 09/20/2013   Procedure: LEFT HEART CATHETERIZATION WITH [name];  Surgeon: [name], MD;  Location: [facility] CATH LAB;  Service: Cardiovascular;  Laterality: N/A;   Lower Extremity Arterial Doppler  03/16/2013   bilat ABIs demonstated normal values; R runoff - posterior tibial & anterior tibial arteries occluded; L runoff - peroneal &  posterior tibial arteries occluded, anterior tibial artery appears occluded   LUMBAR LAMINECTOMY/DECOMPRESSION MICRODISCECTOMY N/A 09/12/2014   Procedure: LUMBAR DECOMPRESSION L3-L4, L4-L5, MICRODISCECTOMY L4-L5;  Surgeon: [name], MD;  Location: [facility] ORS;  Service: Orthopedics;  Laterality: N/A;   PERIPHERAL VASCULAR BALLOON ANGIOPLASTY  11/14/2020   Procedure: PERIPHERAL VASCULAR BALLOON ANGIOPLASTY;  Surgeon: [name], MD;  Location: [place] CV LAB;  Service: Cardiovascular;;   PERIPHERAL VASCULAR BALLOON ANGIOPLASTY Left 01/02/2021   Procedure: PERIPHERAL VASCULAR BALLOON ANGIOPLASTY;  Surgeon: [name], MD;  Location: [place] CV LAB;  Service: Cardiovascular;  Laterality: Left;  Failed PTA   Pilonidal Cyst removed     TRANSTHORACIC ECHOCARDIOGRAM  11/10/2012   EF 40-45%, mild LVH, mild hypokinesis of anteroseptal myocardium, grade 1 diastolic dysfunction; mild MR & calcifed mitral annulus; LA mild-mod dilated; RA mildly dilated    Current Outpatient Medications  Medication Sig Dispense Refill   carvedilol (COREG) 6.25 MG tablet Take 1 tablet by mouth twice daily 180 tablet 0   ELIQUIS 5 MG TABS tablet Take 1 tablet by mouth twice daily 180 tablet 1   empagliflozin (JARDIANCE) 10 MG TABS tablet Take 1 tablet (10 mg total) by mouth daily before breakfast. 30 tablet 11   ezetimibe (ZETIA) 10 MG tablet Take 1 tablet (10 mg total) by mouth daily. 90 tablet 3   furosemide (LASIX) 40 MG tablet Take 40 mg by mouth in the morning and at bedtime.     gabapentin (NEURONTIN) 300 MG capsule Take 600 mg by mouth at bedtime.     glipiZIDE (GLUCOTROL XL) 10 MG 24 hr tablet Take 10 mg by mouth at bedtime.     metFORMIN (GLUCOPHAGE-XR) 500 MG 24 hr tablet Take 1,000 mg by mouth 2 (two) times daily.     ONETOUCH VERIO test strip 1 each daily.     rosuvastatin (CRESTOR) 20 MG tablet Take 10 mg by mouth every other day. In the evening     No current facility-administered  medications for this visit.    Allergies as of 12/01/2021 - Review Complete 11/17/2021  Allergen Reaction Noted   Entresto [sacubitril-valsartan] Other (See Comments) 02/25/2017   Sacubitril Other (See Comments) 10/05/2017    Family History  Problem Relation Age of Onset   Anesthesia problems Daughter    Cancer Mother    Lung disease Father        & heart disease   Colon cancer Sister    Sudden death Maternal Grandmother     Social History   Socioeconomic History   Marital status: Widowed    Spouse name: Not on file   Number of children: 1   Years of education: Not on file   Highest education level: Not on file  Occupational History   Occupation: real Investment banker, corporate  Tobacco Use   Smoking status: Former    Types: Cigars    Quit date: 09/07/2011    Years since quitting: 10.2   Smokeless tobacco: Former    Quit date: 02/28/2012  Substance and Sexual  Activity   Alcohol use: Yes    Alcohol/week: 0.0 standard drinks    Comment: occasional   Drug use: No   Sexual activity: Not on file  Other Topics Concern   Not on file  Social History Narrative   Not on file   Social Determinants of Health   Financial Resource Strain: Not on file  Food Insecurity: Not on file  Transportation Needs: Not on file  Physical Activity: Not on file  Stress: Not on file  Social Connections: Not on file  Intimate Partner Violence: Not on file     Review of Systems   Gen: Denies any fever, chills, fatigue, weight loss, lack of appetite.  CV: Denies chest pain, heart palpitations, peripheral edema, syncope.  Resp: Denies shortness of breath at rest or with exertion. Denies wheezing or cough.  GI: see HPI GU : Denies urinary burning, urinary frequency, urinary hesitancy MS: Denies joint pain, muscle weakness, cramps, or limitation of movement.  Derm: Denies rash, itching, dry skin Psych: Denies depression, anxiety, memory loss, and confusion Heme: Denies bruising, bleeding, and  enlarged lymph nodes.   Physical Exam   There were no vitals taken for this visit. General:   Alert and oriented. Pleasant and cooperative. Well-nourished and well-developed.  Head:  Normocephalic and atraumatic. Eyes:  Without icterus, sclera clear and conjunctiva pink.  Ears:  Normal auditory acuity. Mouth:  No deformity or lesions, oral mucosa pink.  Lungs:  Clear to auscultation bilaterally. No wheezes, rales, or rhonchi. No distress.  Heart:  S1, S2 present without murmurs appreciated.  Abdomen:  +BS, soft, non-tender and non-distended. No HSM noted. No guarding or rebound. No masses appreciated.  Rectal:  Deferred  Msk:  Symmetrical without gross deformities. Normal posture. Extremities:  Without edema. Neurologic:  Alert and  oriented x4;  grossly normal neurologically. Skin:  Intact without significant lesions or rashes. Psych:  Alert and cooperative. Normal mood and affect.   Assessment   WENDLE KINA is a 76 y.o. male with a history of *** presenting today with   Positive heme occult/Anemia: Recent Hgb 12.6, normocytic indices, dating back 1 year.  PLAN   *** CBC, Anemia Panel Proceed with colonoscopy with propofol by Dr. Gala Romney in near future: the risks, benefits, and alternatives have been discussed with the patient in detail. The patient states understanding and desires to proceed. EGD?   Venetia Night, MSN, FNP-BC, AGACNP-BC [facility]

## 2021-12-01 ENCOUNTER — Ambulatory Visit (INDEPENDENT_AMBULATORY_CARE_PROVIDER_SITE_OTHER): Payer: Medicare Other | Admitting: Gastroenterology

## 2021-12-01 ENCOUNTER — Encounter: Payer: Self-pay | Admitting: Gastroenterology

## 2021-12-01 VITALS — BP 108/56 | HR 62 | Temp 97.1°F | Ht 74.0 in | Wt 218.0 lb

## 2021-12-01 DIAGNOSIS — I5022 Chronic systolic (congestive) heart failure: Secondary | ICD-10-CM

## 2021-12-01 DIAGNOSIS — D649 Anemia, unspecified: Secondary | ICD-10-CM | POA: Diagnosis not present

## 2021-12-01 DIAGNOSIS — R195 Other fecal abnormalities: Secondary | ICD-10-CM | POA: Diagnosis not present

## 2021-12-01 DIAGNOSIS — R194 Change in bowel habit: Secondary | ICD-10-CM | POA: Diagnosis not present

## 2021-12-01 NOTE — Addendum Note (Signed)
Addended by: Sherron Monday on: 12/01/2021 04:51 PM   Modules accepted: Orders

## 2021-12-01 NOTE — Patient Instructions (Addendum)
I am going to have you repeat your CBC and get an anemia panel when you have your follow-up lab work for cardiology.(These labs have been done for you to have completed at [place]).   We will schedule you for colonoscopy with propofol with Dr. Gala Romney in the near future.  We will have to obtain clearance from cardiology before scheduling your procedure.  You will need to hold your Eliquis for 2 days prior to procedure You will need to hold your oral diabetes medications night prior to 2 and morning of your procedure.  We will have you follow-up 2 months post procedure.  It was a pleasure to see you today! I want to create trusting relationships with patients. If you receive a survey regarding your visit,  I greatly appreciate you taking time to fill this out on paper or through your MyChart. I value your feedback.  Venetia Night, MSN, FNP-BC, AGACNP-BC [facility]

## 2021-12-02 ENCOUNTER — Telehealth: Payer: Self-pay

## 2021-12-02 DIAGNOSIS — R2681 Unsteadiness on feet: Secondary | ICD-10-CM | POA: Diagnosis not present

## 2021-12-02 DIAGNOSIS — E11621 Type 2 diabetes mellitus with foot ulcer: Secondary | ICD-10-CM | POA: Diagnosis not present

## 2021-12-02 DIAGNOSIS — Z7409 Other reduced mobility: Secondary | ICD-10-CM | POA: Diagnosis not present

## 2021-12-02 DIAGNOSIS — L97529 Non-pressure chronic ulcer of other part of left foot with unspecified severity: Secondary | ICD-10-CM | POA: Diagnosis not present

## 2021-12-02 DIAGNOSIS — R29898 Other symptoms and signs involving the musculoskeletal system: Secondary | ICD-10-CM | POA: Diagnosis not present

## 2021-12-02 DIAGNOSIS — Z789 Other specified health status: Secondary | ICD-10-CM | POA: Diagnosis not present

## 2021-12-02 NOTE — Telephone Encounter (Signed)
Fowarding to courtney to review

## 2021-12-02 NOTE — Telephone Encounter (Signed)
Dr. Gala Romney will be doing a colonoscopy on this patient and is needing a cardiology clearance on this patient due to reduced EF and to hold Eliquis for 2 days prior to procedure. Pt was seen here on November 27, 2021.

## 2021-12-02 NOTE — Telephone Encounter (Signed)
Will call pt to schedule once we have future schedule 

## 2021-12-02 NOTE — Telephone Encounter (Signed)
OK to hold eliquis for 2 days for colonoscopy

## 2021-12-02 NOTE — Telephone Encounter (Signed)
Noted  Pt cleared to have colonoscopy

## 2021-12-05 ENCOUNTER — Ambulatory Visit (HOSPITAL_COMMUNITY): Payer: Medicare Other | Attending: Internal Medicine

## 2021-12-05 DIAGNOSIS — R600 Localized edema: Secondary | ICD-10-CM | POA: Diagnosis not present

## 2021-12-05 DIAGNOSIS — Z951 Presence of aortocoronary bypass graft: Secondary | ICD-10-CM | POA: Insufficient documentation

## 2021-12-05 DIAGNOSIS — I5023 Acute on chronic systolic (congestive) heart failure: Secondary | ICD-10-CM | POA: Insufficient documentation

## 2021-12-05 LAB — ECHOCARDIOGRAM COMPLETE
Area-P 1/2: 4.8 cm2
S' Lateral: 5.3 cm

## 2021-12-05 MED ORDER — PERFLUTREN LIPID MICROSPHERE
2.0000 mL | INTRAVENOUS | Status: AC | PRN
  Administered 2021-12-05: 2 mL via INTRAVENOUS

## 2021-12-08 ENCOUNTER — Other Ambulatory Visit: Payer: Self-pay | Admitting: Gastroenterology

## 2021-12-08 DIAGNOSIS — E11621 Type 2 diabetes mellitus with foot ulcer: Secondary | ICD-10-CM | POA: Diagnosis not present

## 2021-12-08 DIAGNOSIS — L97529 Non-pressure chronic ulcer of other part of left foot with unspecified severity: Secondary | ICD-10-CM | POA: Diagnosis not present

## 2021-12-08 DIAGNOSIS — R29898 Other symptoms and signs involving the musculoskeletal system: Secondary | ICD-10-CM | POA: Diagnosis not present

## 2021-12-08 DIAGNOSIS — R2681 Unsteadiness on feet: Secondary | ICD-10-CM | POA: Diagnosis not present

## 2021-12-08 DIAGNOSIS — I5023 Acute on chronic systolic (congestive) heart failure: Secondary | ICD-10-CM | POA: Diagnosis not present

## 2021-12-08 DIAGNOSIS — Z7409 Other reduced mobility: Secondary | ICD-10-CM | POA: Diagnosis not present

## 2021-12-08 DIAGNOSIS — Z789 Other specified health status: Secondary | ICD-10-CM | POA: Diagnosis not present

## 2021-12-08 DIAGNOSIS — D649 Anemia, unspecified: Secondary | ICD-10-CM | POA: Diagnosis not present

## 2021-12-09 ENCOUNTER — Ambulatory Visit (INDEPENDENT_AMBULATORY_CARE_PROVIDER_SITE_OTHER): Payer: Medicare Other | Admitting: Nurse Practitioner

## 2021-12-09 ENCOUNTER — Encounter: Payer: Self-pay | Admitting: Nurse Practitioner

## 2021-12-09 VITALS — BP 120/58 | Resp 20 | Ht 73.0 in | Wt 215.8 lb

## 2021-12-09 DIAGNOSIS — Z951 Presence of aortocoronary bypass graft: Secondary | ICD-10-CM | POA: Diagnosis not present

## 2021-12-09 DIAGNOSIS — I480 Paroxysmal atrial fibrillation: Secondary | ICD-10-CM

## 2021-12-09 DIAGNOSIS — E119 Type 2 diabetes mellitus without complications: Secondary | ICD-10-CM | POA: Diagnosis not present

## 2021-12-09 DIAGNOSIS — I739 Peripheral vascular disease, unspecified: Secondary | ICD-10-CM

## 2021-12-09 DIAGNOSIS — I2510 Atherosclerotic heart disease of native coronary artery without angina pectoris: Secondary | ICD-10-CM

## 2021-12-09 DIAGNOSIS — G4733 Obstructive sleep apnea (adult) (pediatric): Secondary | ICD-10-CM

## 2021-12-09 DIAGNOSIS — E785 Hyperlipidemia, unspecified: Secondary | ICD-10-CM | POA: Diagnosis not present

## 2021-12-09 DIAGNOSIS — I255 Ischemic cardiomyopathy: Secondary | ICD-10-CM | POA: Diagnosis not present

## 2021-12-09 DIAGNOSIS — I5022 Chronic systolic (congestive) heart failure: Secondary | ICD-10-CM | POA: Diagnosis not present

## 2021-12-09 DIAGNOSIS — I10 Essential (primary) hypertension: Secondary | ICD-10-CM

## 2021-12-09 LAB — VITAMIN B12: Vitamin B-12: 255 pg/mL (ref 232–1245)

## 2021-12-09 LAB — CBC/DIFF AMBIGUOUS DEFAULT
Basophils Absolute: 0.1 10*3/uL (ref 0.0–0.2)
Basos: 1 %
EOS (ABSOLUTE): 0.2 10*3/uL (ref 0.0–0.4)
Eos: 3 %
Hematocrit: 40.5 % (ref 37.5–51.0)
Hemoglobin: 13 g/dL (ref 13.0–17.7)
Immature Grans (Abs): 0 10*3/uL (ref 0.0–0.1)
Immature Granulocytes: 0 %
Lymphocytes Absolute: 1.8 10*3/uL (ref 0.7–3.1)
Lymphs: 28 %
MCH: 27.5 pg (ref 26.6–33.0)
MCHC: 32.1 g/dL (ref 31.5–35.7)
MCV: 86 fL (ref 79–97)
Monocytes Absolute: 0.5 10*3/uL (ref 0.1–0.9)
Monocytes: 8 %
Neutrophils Absolute: 3.9 10*3/uL (ref 1.4–7.0)
Neutrophils: 60 %
Platelets: 198 10*3/uL (ref 150–450)
RBC: 4.72 x10E6/uL (ref 4.14–5.80)
RDW: 14.7 % (ref 11.6–15.4)
WBC: 6.4 10*3/uL (ref 3.4–10.8)

## 2021-12-09 LAB — BASIC METABOLIC PANEL
BUN/Creatinine Ratio: 19 (ref 10–24)
BUN: 22 mg/dL (ref 8–27)
CO2: 25 mmol/L (ref 20–29)
Calcium: 8.9 mg/dL (ref 8.6–10.2)
Chloride: 101 mmol/L (ref 96–106)
Creatinine, Ser: 1.18 mg/dL (ref 0.76–1.27)
Glucose: 171 mg/dL — ABNORMAL HIGH (ref 70–99)
Potassium: 4.1 mmol/L (ref 3.5–5.2)
Sodium: 142 mmol/L (ref 134–144)
eGFR: 64 mL/min/{1.73_m2} (ref >59)

## 2021-12-09 LAB — FERRITIN: Ferritin: 55 ng/mL (ref 30–400)

## 2021-12-09 LAB — SPECIMEN STATUS REPORT

## 2021-12-09 LAB — FOLATE: Folate: 14.9 ng/mL (ref >3.0)

## 2021-12-09 LAB — IRON AND TIBC
Iron Saturation: 13 % — ABNORMAL LOW (ref 15–55)
Iron: 59 ug/dL (ref 38–169)
Total Iron Binding Capacity: 441 ug/dL (ref 250–450)
UIBC: 382 ug/dL — ABNORMAL HIGH (ref 111–343)

## 2021-12-09 MED ORDER — SODIUM CHLORIDE 0.9% FLUSH: 3.0000 mL | Freq: Two times a day (BID) | INTRAVENOUS | Status: DC

## 2021-12-09 NOTE — H&P (View-Only) (Signed)
Office Visit    Patient Name: [name] [name] Date of Encounter: 12/09/2021  Primary Care Provider:  [name] [name], MD Primary Cardiologist:  [name], MD  Chief Complaint    76 year old male with a history of CAD s/p CABG x3 (LIMA to LAD, SVG to PDA, SVG to posterolateral branch of RCA) in 8502, chronic systolic heart failure, ICM, paroxysmal atrial fibrillation s/p DCCV in 2020 on Eliquis, bifascicular block, hypertension, orthostatic hypotension, hyperlipidemia, PVD, OSA on BiPAP, type II diabetes, and spinal stenosis who presents for follow-up related to CAD and heart failure.  Past Medical History    Past Medical History:  Diagnosis Date   Arthritis    Knee L - probably   Atrial fibrillation (HCC)    BPH (benign prostatic hyperplasia)    CAD (coronary artery disease) 07/06/2011   CHF (congestive heart failure) (HCC)    Coronary artery disease    Diabetes mellitus    Frequency of urination    Heart failure (HCC)    High cholesterol    History of nuclear stress test 09/2010   dipyridamole; mild perfusion defect due to attenuation with mild superimposed ischemia in apical septal, apical, apical inferior & apical lateral regions; rest LV enlarged in size; prominent gut uptake in infero-apical region; no significant ischemia demonstrated; low risk scan    HNP (herniated nucleus pulposus), lumbar    Hypertension    Ischemic cardiomyopathy    LV dysfunction 07/06/2011   Nocturia    Right bundle branch block    S/P CABG (coronary artery bypass graft) 08/03/2011   x3; LIMA to LAD,, SVG to PDA, SVG to posterolateral branch of RCA; Dr. Ellison Hughs   Sleep apnea    sleep study 10/2010- AHI during total sleep 32.1/hr and during REM 62.3/hr (severe sleep apnea)unable to tolerate c pap   Spinal stenosis of lumbar region    Stroke [facility])    [place]    Past Surgical History:  Procedure Laterality Date   ABDOMINAL AORTOGRAM W/LOWER EXTREMITY Left 11/14/2020   Procedure:  ABDOMINAL AORTOGRAM W/LOWER EXTREMITY;  Surgeon: [name], MD;  Location: [place] [facility];  Service: Cardiovascular;  Laterality: Left;   ABDOMINAL AORTOGRAM W/LOWER EXTREMITY N/A 01/02/2021   Procedure: ABDOMINAL AORTOGRAM W/LOWER EXTREMITY;  Surgeon: [name], MD;  Location: [place] [facility];  Service: Cardiovascular;  Laterality: N/A;   AMPUTATION Left 03/20/2021   Procedure: Left foot 5th ray amputation;  Surgeon: [name], MD;  Location: [place];  Service: Orthopedics;  Laterality: Left;   CARDIAC CATHETERIZATION  01/2011   ischemic cardiomyopathy 30-35%, multivessel CAD (Dr. [name])    CARDIAC CATHETERIZATION  09/13/2015   Procedure: Right/Left Heart Cath and Coronary/Graft Angiography;  Surgeon: [name], MD;  Location: [place] [facility];  Service: Cardiovascular;;   CARDIOVERSION N/A 05/16/2019   Procedure: CARDIOVERSION;  Surgeon: [name], MD;  Location: [place];  Service: Cardiovascular;  Laterality: N/A;   Colonscopy     CORONARY ARTERY BYPASS GRAFT  08/03/2011   Procedure: CORONARY ARTERY BYPASS GRAFTING (CABG);  Surgeon: [name], MD;  Location: [place];  Service: Open Heart Surgery;  Laterality: N/A;  CABG times three using right saphenous vein and left mammary artery usisng endoscope.   LEFT HEART CATHETERIZATION WITH CORONARY/GRAFT ANGIOGRAM N/A 09/20/2013   Procedure: LEFT HEART CATHETERIZATION WITH [name];  Surgeon: [name], MD;  Location: [facility] CATH [facility];  Service: Cardiovascular;  Laterality: N/A;  Lower Extremity Arterial Doppler  03/16/2013   bilat ABIs demonstated normal values; R runoff - posterior tibial & anterior tibial arteries occluded; L runoff - peroneal & posterior tibial arteries occluded, anterior tibial artery appears occluded   LUMBAR LAMINECTOMY/DECOMPRESSION MICRODISCECTOMY N/A 09/12/2014   Procedure: LUMBAR DECOMPRESSION L3-L4, L4-L5, MICRODISCECTOMY L4-L5;  Surgeon: [name], MD;   Location: [facility];  Service: Orthopedics;  Laterality: N/A;   PERIPHERAL VASCULAR BALLOON ANGIOPLASTY  11/14/2020   Procedure: PERIPHERAL VASCULAR BALLOON ANGIOPLASTY;  Surgeon: [name], MD;  Location: [place] [facility];  Service: Cardiovascular;;   PERIPHERAL VASCULAR BALLOON ANGIOPLASTY Left 01/02/2021   Procedure: PERIPHERAL VASCULAR BALLOON ANGIOPLASTY;  Surgeon: [name], MD;  Location: [place] [facility];  Service: Cardiovascular;  Laterality: Left;  Failed PTA   Pilonidal Cyst removed     TRANSTHORACIC ECHOCARDIOGRAM  11/10/2012   EF 40-45%, mild LVH, mild hypokinesis of anteroseptal myocardium, grade 1 diastolic dysfunction; mild MR & calcifed mitral annulus; LA mild-mod dilated; RA mildly dilated    Allergies  Allergies  Allergen Reactions   Entresto [Sacubitril-Valsartan] Other (See Comments)    dizziness   Sacubitril Other (See Comments)     Dizziness    History of Present Illness    76 year old male with the above past medical history including CAD s/p CABG x3 (LIMA to LAD, SVG to PDA, SVG to posterolateral branch of RCA) in 6789, chronic systolic heart failure, ICM, paroxysmal atrial fibrillation s/p DCCV in 2020 on Eliquis, bifascicular block, hypertension, orthostatic hypotension, hyperlipidemia, PVD, OSA on BiPAP, type II diabetes, and spinal stenosis.  He has a history of CAD and underwent CABG x3 in 2013.  EF was 2025% prior to surgery, however, postsurgery, EF improved to 45 to 50%.  Myoview in 2015 was low risk.  Cardiac catheterization in March 2017 showed two-vessel native CAD with occluded proximal LAD and 85% proximal RCA stenosis, patent grafts s/p CABG x3, EF 30 to 35% with global hypokinesis and inferior akinesis.  Echocardiogram in October 2017 showed EF and slightly improved to 35 to 40%. He has a history of PAD, critical left lower limb ischemia, osteomyelitis, following with vascular surgery. He was last seen in the office on 11/17/2021 and  reported increased dyspnea on exertion, fatigue.  Lasix was increased.  Repeat echocardiogram showed EF 30 to 35%, mild LVH, mild LV cavity dilation, indeterminant diastolic parameters, mild BAE, severe mitral valve regurgitation, regurgitation.  Cardiac catheterization was recommended given decrease in EF.  Additionally, he is pending colonoscopy for positive Hemosure.  He presents today for follow-up accompanied by his significant other.  Since his last visit he has been stable overall from a cardiac standpoint.  He does report some dyspnea on exertion, generalized fatigue.  He states his swelling has resolved with increased Lasix, he denies PND, orthopnea. He is aware of Dr. Lysbeth Penner recommendation for [facility] and is agreeable to proceed.  He is eager to have this done so that he may hopefully proceed with upcoming colonoscopy.   Home Medications    Current Outpatient Medications  Medication Sig Dispense Refill   aspirin EC 81 MG tablet Take 81 mg by mouth daily. Swallow whole.     carvedilol (COREG) 6.25 MG tablet Take 1 tablet by mouth twice daily 180 tablet 0   ELIQUIS 5 MG TABS tablet Take 1 tablet by mouth twice daily 180 tablet 1   empagliflozin (JARDIANCE) 10 MG TABS tablet Take 1 tablet (10 mg total) by mouth daily before breakfast.  30 tablet 11   furosemide (LASIX) 40 MG tablet Take 40 mg by mouth in the morning and at bedtime.     gabapentin (NEURONTIN) 300 MG capsule Take 600 mg by mouth at bedtime.     glipiZIDE (GLUCOTROL XL) 10 MG 24 hr tablet Take 10 mg by mouth at bedtime.     metFORMIN (GLUCOPHAGE-XR) 500 MG 24 hr tablet Take 1,000 mg by mouth 2 (two) times daily.     ONETOUCH VERIO test strip 1 each daily.     rosuvastatin (CRESTOR) 20 MG tablet Take 10 mg by mouth every other day. In the evening     ezetimibe (ZETIA) 10 MG tablet Take 1 tablet (10 mg total) by mouth daily. 90 tablet 3   Current Facility-Administered Medications  Medication Dose Route Frequency Provider Last Rate  Last Admin   sodium chloride flush (NS) 0.9 % injection 3 mL  3 mL Intravenous Q12H Chaz Ronning C, NP         Review of Systems    He denies chest pain, palpitations, pnd, orthopnea, n, v, dizziness, syncope, edema, weight gain, or early satiety. All other systems reviewed and are otherwise negative except as noted above.   Physical Exam    VS:  BP (!) 120/58 (BP Location: Left Arm, Patient Position: Sitting, Cuff Size: Normal)   Resp 20   Ht '6\' 1"'$  (1.854 m)   Wt 215 lb 12.8 oz (97.9 kg)   SpO2 96%   BMI 28.47 kg/m   GEN: Well nourished, well developed, in no acute distress. HEENT: normal. Neck: Supple, no JVD, carotid bruits, or masses. Cardiac: RRR, no murmurs, rubs, or gallops. No clubbing, cyanosis, edema.  Radials/DP/PT 2+ and equal bilaterally.  Respiratory:  Respirations regular and unlabored, clear to auscultation bilaterally. GI: Soft, nontender, nondistended, BS + x 4. MS: no deformity or atrophy. Skin: warm and dry, no rash. Neuro:  Strength and sensation are intact. Psych: Normal affect.  Accessory Clinical Findings    ECG personally reviewed by me today -sinus rhythm, 63 bpm, LAD, RBBB, nonspecific ST/T wave changes- no acute changes.  Lab Results  Component Value Date   WBC WILL FOLLOW 12/08/2021   HGB WILL FOLLOW 12/08/2021   HCT WILL FOLLOW 12/08/2021   MCV WILL FOLLOW 12/08/2021   PLT WILL FOLLOW 12/08/2021   Lab Results  Component Value Date   CREATININE 1.18 12/08/2021   BUN 22 12/08/2021   NA 142 12/08/2021   K 4.1 12/08/2021   CL 101 12/08/2021   CO2 25 12/08/2021   Lab Results  Component Value Date   ALT 13 11/17/2021   AST 13 11/17/2021   ALKPHOS 147 (H) 11/17/2021   BILITOT 1.2 11/17/2021   Lab Results  Component Value Date   CHOL 172 08/14/2021   HDL 38 (L) 08/14/2021   LDLCALC 118 (H) 08/14/2021   TRIG 86 08/14/2021   CHOLHDL 4.5 08/14/2021    Lab Results  Component Value Date   HGBA1C 7.0 (H) 10/17/2020    Assessment &  Plan    1. CAD: S/p CABG x3 (LIMA to LAD, SVG to PDA, SVG to posterolateral branch of RCA) in 2013. Cath in March 2017 showed two-vessel native CAD with occluded proximal LAD and 85% proximal RCA stenosis, patent grafts s/p CABG x3, EF 30 to 35% with global hypokinesis and inferior akinesis.  Evaluated in May 2023 with signs of fluid volume overload, increased dyspnea on exertion, and generalized fatigue. Repeat echo showed EF 30  to 35%, mild LVH, mild LV cavity dilation, indeterminant diastolic parameters, mild BAE, severe mitral valve regurgitation, regurgitation. He does note some ongoing dyspnea on exertion, denies chest pain.  Dr. Debara Pickett advised [facility] to assess for ischemic cause of worsening EF.  He is agreeable to proceed.  BMET/CBC available in epic and were drawn on 12/08/2021.  Will hold Eliquis for 48 hours prior to cath.  Hold Lasix day of procedure.  Hold metformin day before procedure.  Continue aspirin, carvedilol, Jardiance, Lasix, Zetia, and Crestor.  Shared Decision Making/Informed Consent The risks [stroke (1 in 1000), death (1 in 1000), kidney failure [usually temporary] (1 in 500), bleeding (1 in 200), allergic reaction [possibly serious] (1 in 200)], benefits (diagnostic support and management of coronary artery disease) and alternatives of a cardiac catheterization were discussed in detail with Mr. Guardia and he is willing to proceed.  2. Chronic systolic heart failure/ICM: Most recent echo as above (EF 30-35%).  BNP was elevated at last OV.  He does have ongoing mild dyspnea on exertion, otherwise, euvolemic and well compensated on exam.  Volume status has improved dramatically with increase in Lasix.  Per patient request, will repeat BNP today.  Of note, he did not tolerate Entresto due to dizziness.  Continue current medications as above.  3. Paroxysmal atrial fibrillation:  S/p DCCV in 2020, on Eliquis.  RRR on exam. He will hold Eliquis for 48 hours prior to [facility].  Continue  carvedilol, Eliquis.  4. Hypertension/h/o orthostatic hypotension: BP well controlled. Continue current antihypertensive regimen.   5. Hyperlipidemia: LDL was 68 in March 2023.  Continue aspirin, Crestor, Jardiance.  6. PAD: With critical left lower limb ischemia, h/o osteomyelitis. Following with vascular surgery.   7. OSA on BiPAP: Denies any concerns today.  8. Type II diabetes: A1c was 7.1 in March 2023.  Monitored and managed per PCP.  9. Disposition: F/u in 2-3 weeks post-cath.   Lenna Sciara, NP 12/09/2021, 4:57 PM

## 2021-12-09 NOTE — Progress Notes (Signed)
Office Visit    Patient Name: [name] [name] Date of Encounter: 12/09/2021  Primary Care Provider:  [name] [name], MD Primary Cardiologist:  [name], MD  Chief Complaint    76 year old male with a history of CAD s/p CABG x3 (LIMA to LAD, SVG to PDA, SVG to posterolateral branch of RCA) in 7846, chronic systolic heart failure, ICM, paroxysmal atrial fibrillation s/p DCCV in 2020 on Eliquis, bifascicular block, hypertension, orthostatic hypotension, hyperlipidemia, PVD, OSA on BiPAP, type II diabetes, and spinal stenosis who presents for follow-up related to CAD and heart failure.  Past Medical History    Past Medical History:  Diagnosis Date   Arthritis    Knee L - probably   Atrial fibrillation (HCC)    BPH (benign prostatic hyperplasia)    CAD (coronary artery disease) 07/06/2011   CHF (congestive heart failure) (HCC)    Coronary artery disease    Diabetes mellitus    Frequency of urination    Heart failure (HCC)    High cholesterol    History of nuclear stress test 09/2010   dipyridamole; mild perfusion defect due to attenuation with mild superimposed ischemia in apical septal, apical, apical inferior & apical lateral regions; rest LV enlarged in size; prominent gut uptake in infero-apical region; no significant ischemia demonstrated; low risk scan    HNP (herniated nucleus pulposus), lumbar    Hypertension    Ischemic cardiomyopathy    LV dysfunction 07/06/2011   Nocturia    Right bundle branch block    S/P CABG (coronary artery bypass graft) 08/03/2011   x3; LIMA to LAD,, SVG to PDA, SVG to posterolateral branch of RCA; Dr. Ellison Hughs   Sleep apnea    sleep study 10/2010- AHI during total sleep 32.1/hr and during REM 62.3/hr (severe sleep apnea)unable to tolerate c pap   Spinal stenosis of lumbar region    Stroke [facility])    [place]    Past Surgical History:  Procedure Laterality Date   ABDOMINAL AORTOGRAM W/LOWER EXTREMITY Left 11/14/2020   Procedure:  ABDOMINAL AORTOGRAM W/LOWER EXTREMITY;  Surgeon: [name], MD;  Location: [place] [facility];  Service: Cardiovascular;  Laterality: Left;   ABDOMINAL AORTOGRAM W/LOWER EXTREMITY N/A 01/02/2021   Procedure: ABDOMINAL AORTOGRAM W/LOWER EXTREMITY;  Surgeon: [name], MD;  Location: [place] [facility];  Service: Cardiovascular;  Laterality: N/A;   AMPUTATION Left 03/20/2021   Procedure: Left foot 5th ray amputation;  Surgeon: [name], MD;  Location: [place];  Service: Orthopedics;  Laterality: Left;   CARDIAC CATHETERIZATION  01/2011   ischemic cardiomyopathy 30-35%, multivessel CAD (Dr. [name])    CARDIAC CATHETERIZATION  09/13/2015   Procedure: Right/Left Heart Cath and Coronary/Graft Angiography;  Surgeon: [name], MD;  Location: [place] [facility];  Service: Cardiovascular;;   CARDIOVERSION N/A 05/16/2019   Procedure: CARDIOVERSION;  Surgeon: [name], MD;  Location: [place];  Service: Cardiovascular;  Laterality: N/A;   Colonscopy     CORONARY ARTERY BYPASS GRAFT  08/03/2011   Procedure: CORONARY ARTERY BYPASS GRAFTING (CABG);  Surgeon: [name], MD;  Location: [place];  Service: Open Heart Surgery;  Laterality: N/A;  CABG times three using right saphenous vein and left mammary artery usisng endoscope.   LEFT HEART CATHETERIZATION WITH CORONARY/GRAFT ANGIOGRAM N/A 09/20/2013   Procedure: LEFT HEART CATHETERIZATION WITH [name];  Surgeon: [name], MD;  Location: [facility] CATH LAB;  Service: Cardiovascular;  Laterality: N/A;  Lower Extremity Arterial Doppler  03/16/2013   bilat ABIs demonstated normal values; R runoff - posterior tibial & anterior tibial arteries occluded; L runoff - peroneal & posterior tibial arteries occluded, anterior tibial artery appears occluded   LUMBAR LAMINECTOMY/DECOMPRESSION MICRODISCECTOMY N/A 09/12/2014   Procedure: LUMBAR DECOMPRESSION L3-L4, L4-L5, MICRODISCECTOMY L4-L5;  Surgeon: [name], MD;   Location: [facility];  Service: Orthopedics;  Laterality: N/A;   PERIPHERAL VASCULAR BALLOON ANGIOPLASTY  11/14/2020   Procedure: PERIPHERAL VASCULAR BALLOON ANGIOPLASTY;  Surgeon: [name], MD;  Location: [place] [facility];  Service: Cardiovascular;;   PERIPHERAL VASCULAR BALLOON ANGIOPLASTY Left 01/02/2021   Procedure: PERIPHERAL VASCULAR BALLOON ANGIOPLASTY;  Surgeon: [name], MD;  Location: [place] [facility];  Service: Cardiovascular;  Laterality: Left;  Failed PTA   Pilonidal Cyst removed     TRANSTHORACIC ECHOCARDIOGRAM  11/10/2012   EF 40-45%, mild LVH, mild hypokinesis of anteroseptal myocardium, grade 1 diastolic dysfunction; mild MR & calcifed mitral annulus; LA mild-mod dilated; RA mildly dilated    Allergies  Allergies  Allergen Reactions   Entresto [Sacubitril-Valsartan] Other (See Comments)    dizziness   Sacubitril Other (See Comments)     Dizziness    History of Present Illness    76 year old male with the above past medical history including CAD s/p CABG x3 (LIMA to LAD, SVG to PDA, SVG to posterolateral branch of RCA) in 7253, chronic systolic heart failure, ICM, paroxysmal atrial fibrillation s/p DCCV in 2020 on Eliquis, bifascicular block, hypertension, orthostatic hypotension, hyperlipidemia, PVD, OSA on BiPAP, type II diabetes, and spinal stenosis.  He has a history of CAD and underwent CABG x3 in 2013.  EF was 2025% prior to surgery, however, postsurgery, EF improved to 45 to 50%.  Myoview in 2015 was low risk.  Cardiac catheterization in March 2017 showed two-vessel native CAD with occluded proximal LAD and 85% proximal RCA stenosis, patent grafts s/p CABG x3, EF 30 to 35% with global hypokinesis and inferior akinesis.  Echocardiogram in October 2017 showed EF and slightly improved to 35 to 40%. He has a history of PAD, critical left lower limb ischemia, osteomyelitis, following with vascular surgery. He was last seen in the office on 11/17/2021 and  reported increased dyspnea on exertion, fatigue.  Lasix was increased.  Repeat echocardiogram showed EF 30 to 35%, mild LVH, mild LV cavity dilation, indeterminant diastolic parameters, mild BAE, severe mitral valve regurgitation, regurgitation.  Cardiac catheterization was recommended given decrease in EF.  Additionally, he is pending colonoscopy for positive Hemosure.  He presents today for follow-up accompanied by his significant other.  Since his last visit he has been stable overall from a cardiac standpoint.  He does report some dyspnea on exertion, generalized fatigue.  He states his swelling has resolved with increased Lasix, he denies PND, orthopnea. He is aware of Dr. Lysbeth Penner recommendation for [facility] and is agreeable to proceed.  He is eager to have this done so that he may hopefully proceed with upcoming colonoscopy.   Home Medications    Current Outpatient Medications  Medication Sig Dispense Refill   aspirin EC 81 MG tablet Take 81 mg by mouth daily. Swallow whole.     carvedilol (COREG) 6.25 MG tablet Take 1 tablet by mouth twice daily 180 tablet 0   ELIQUIS 5 MG TABS tablet Take 1 tablet by mouth twice daily 180 tablet 1   empagliflozin (JARDIANCE) 10 MG TABS tablet Take 1 tablet (10 mg total) by mouth daily before breakfast.  30 tablet 11   furosemide (LASIX) 40 MG tablet Take 40 mg by mouth in the morning and at bedtime.     gabapentin (NEURONTIN) 300 MG capsule Take 600 mg by mouth at bedtime.     glipiZIDE (GLUCOTROL XL) 10 MG 24 hr tablet Take 10 mg by mouth at bedtime.     metFORMIN (GLUCOPHAGE-XR) 500 MG 24 hr tablet Take 1,000 mg by mouth 2 (two) times daily.     ONETOUCH VERIO test strip 1 each daily.     rosuvastatin (CRESTOR) 20 MG tablet Take 10 mg by mouth every other day. In the evening     ezetimibe (ZETIA) 10 MG tablet Take 1 tablet (10 mg total) by mouth daily. 90 tablet 3   Current Facility-Administered Medications  Medication Dose Route Frequency Provider Last Rate  Last Admin   sodium chloride flush (NS) 0.9 % injection 3 mL  3 mL Intravenous Q12H Yoshimi Sarr C, NP         Review of Systems    He denies chest pain, palpitations, pnd, orthopnea, n, v, dizziness, syncope, edema, weight gain, or early satiety. All other systems reviewed and are otherwise negative except as noted above.   Physical Exam    VS:  BP (!) 120/58 (BP Location: Left Arm, Patient Position: Sitting, Cuff Size: Normal)   Resp 20   Ht '6\' 1"'$  (1.854 m)   Wt 215 lb 12.8 oz (97.9 kg)   SpO2 96%   BMI 28.47 kg/m   GEN: Well nourished, well developed, in no acute distress. HEENT: normal. Neck: Supple, no JVD, carotid bruits, or masses. Cardiac: RRR, no murmurs, rubs, or gallops. No clubbing, cyanosis, edema.  Radials/DP/PT 2+ and equal bilaterally.  Respiratory:  Respirations regular and unlabored, clear to auscultation bilaterally. GI: Soft, nontender, nondistended, BS + x 4. MS: no deformity or atrophy. Skin: warm and dry, no rash. Neuro:  Strength and sensation are intact. Psych: Normal affect.  Accessory Clinical Findings    ECG personally reviewed by me today -sinus rhythm, 63 bpm, LAD, RBBB, nonspecific ST/T wave changes- no acute changes.  Lab Results  Component Value Date   WBC WILL FOLLOW 12/08/2021   HGB WILL FOLLOW 12/08/2021   HCT WILL FOLLOW 12/08/2021   MCV WILL FOLLOW 12/08/2021   PLT WILL FOLLOW 12/08/2021   Lab Results  Component Value Date   CREATININE 1.18 12/08/2021   BUN 22 12/08/2021   NA 142 12/08/2021   K 4.1 12/08/2021   CL 101 12/08/2021   CO2 25 12/08/2021   Lab Results  Component Value Date   ALT 13 11/17/2021   AST 13 11/17/2021   ALKPHOS 147 (H) 11/17/2021   BILITOT 1.2 11/17/2021   Lab Results  Component Value Date   CHOL 172 08/14/2021   HDL 38 (L) 08/14/2021   LDLCALC 118 (H) 08/14/2021   TRIG 86 08/14/2021   CHOLHDL 4.5 08/14/2021    Lab Results  Component Value Date   HGBA1C 7.0 (H) 10/17/2020    Assessment &  Plan    1. CAD: S/p CABG x3 (LIMA to LAD, SVG to PDA, SVG to posterolateral branch of RCA) in 2013. Cath in March 2017 showed two-vessel native CAD with occluded proximal LAD and 85% proximal RCA stenosis, patent grafts s/p CABG x3, EF 30 to 35% with global hypokinesis and inferior akinesis.  Evaluated in May 2023 with signs of fluid volume overload, increased dyspnea on exertion, and generalized fatigue. Repeat echo showed EF 30  to 35%, mild LVH, mild LV cavity dilation, indeterminant diastolic parameters, mild BAE, severe mitral valve regurgitation, regurgitation. He does note some ongoing dyspnea on exertion, denies chest pain.  Dr. Debara Pickett advised [facility] to assess for ischemic cause of worsening EF.  He is agreeable to proceed.  BMET/CBC available in epic and were drawn on 12/08/2021.  Will hold Eliquis for 48 hours prior to cath.  Hold Lasix day of procedure.  Hold metformin day before procedure.  Continue aspirin, carvedilol, Jardiance, Lasix, Zetia, and Crestor.  Shared Decision Making/Informed Consent The risks [stroke (1 in 1000), death (1 in 1000), kidney failure [usually temporary] (1 in 500), bleeding (1 in 200), allergic reaction [possibly serious] (1 in 200)], benefits (diagnostic support and management of coronary artery disease) and alternatives of a cardiac catheterization were discussed in detail with Mr. Mortensen and he is willing to proceed.  2. Chronic systolic heart failure/ICM: Most recent echo as above (EF 30-35%).  BNP was elevated at last OV.  He does have ongoing mild dyspnea on exertion, otherwise, euvolemic and well compensated on exam.  Volume status has improved dramatically with increase in Lasix.  Per patient request, will repeat BNP today.  Of note, he did not tolerate Entresto due to dizziness.  Continue current medications as above.  3. Paroxysmal atrial fibrillation:  S/p DCCV in 2020, on Eliquis.  RRR on exam. He will hold Eliquis for 48 hours prior to [facility].  Continue  carvedilol, Eliquis.  4. Hypertension/h/o orthostatic hypotension: BP well controlled. Continue current antihypertensive regimen.   5. Hyperlipidemia: LDL was 68 in March 2023.  Continue aspirin, Crestor, Jardiance.  6. PAD: With critical left lower limb ischemia, h/o osteomyelitis. Following with vascular surgery.   7. OSA on BiPAP: Denies any concerns today.  8. Type II diabetes: A1c was 7.1 in March 2023.  Monitored and managed per PCP.  9. Disposition: F/u in 2-3 weeks post-cath.   Lenna Sciara, NP 12/09/2021, 4:57 PM

## 2021-12-09 NOTE — Patient Instructions (Addendum)
Medication Instructions:  HOLD ELIQUIS 48 HOURS PRIOR TO PROCEDURE. HOLD LASIX THE DAY OF PROCEDURE. *If you need a refill on your cardiac medications before your next appointment, please call your pharmacy*   Lab Work: Your physician recommends that you complete labs today. BNP  If you have labs (blood work) drawn today and your tests are completely normal, you will receive your results only by: MyChart Message (if you have MyChart) OR A paper copy in the mail If you have any lab test that is abnormal or we need to change your treatment, we will call you to review the results.   Testing/Procedures: Your physician has requested that you have a cardiac catheterization. Cardiac catheterization is used to diagnose and/or treat various heart conditions. Doctors may recommend this procedure for a number of different reasons. The most common reason is to evaluate chest pain. Chest pain can be a symptom of coronary artery disease (CAD), and cardiac catheterization can show whether plaque is narrowing or blocking your heart's arteries. This procedure is also used to evaluate the valves, as well as measure the blood flow and oxygen levels in different parts of your heart. For further information please visit [email or web address]. Please follow instruction sheet, as given.     Follow-Up: At [facility], you and your health needs are our priority.  As part of our continuing mission to provide you with exceptional heart care, we have created designated Provider Care Teams.  These Care Teams include your primary Cardiologist (physician) and Advanced Practice Providers (APPs -  Physician Assistants and Nurse Practitioners) who all work together to provide you with the care you need, when you need it.  We recommend signing up for the patient portal called "MyChart".  Sign up information is provided on this After Visit Summary.  MyChart is used to connect with patients for Virtual Visits (Telemedicine).   Patients are able to view lab/test results, encounter notes, upcoming appointments, etc.  Non-urgent messages can be sent to your provider as well.   To learn more about what you can do with MyChart, go to [email or web address].    Your next appointment:   3-4 week(s)(POST CATH)  The format for your next appointment:   In Person  Provider:   Diona Browner, NP        Other Instructions  [address] [place] [address] Dept: [phone] Loc: [place]  12/09/2021  You are scheduled for a Cardiac Catheterization on Monday, June 19 with Dr. [name].  1. Please arrive at the Main Entrance A at [facility]: [place], [place] [address] at 11:00 AM (This time is two hours before your procedure to ensure your preparation). Free valet parking service is available.   Special note: Every effort is made to have your procedure done on time. Please understand that emergencies sometimes delay scheduled procedures.  2. Diet: Do not eat solid foods after midnight.  You may have clear liquids until 5 AM upon the day of the procedure.  3. Labs:  4. Medication instructions in preparation for your procedure:   Contrast Allergy: No   Current Outpatient Medications (Endocrine & Metabolic):    empagliflozin (JARDIANCE) 10 MG TABS tablet, Take 1 tablet (10 mg total) by mouth daily before breakfast.   glipiZIDE (GLUCOTROL XL) 10 MG 24 hr tablet, Take 10 mg by mouth at bedtime.   metFORMIN (GLUCOPHAGE-XR) 500 MG 24  hr tablet, Take 1,000 mg by mouth 2 (two) times daily.  Current Outpatient Medications (Cardiovascular):    carvedilol (COREG) 6.25 MG tablet, Take 1 tablet by mouth twice daily   furosemide (LASIX) 40 MG tablet, Take 40 mg by mouth in the morning and at bedtime.   rosuvastatin (CRESTOR) 20 MG tablet, Take 10 mg by mouth every other day.  In the evening   ezetimibe (ZETIA) 10 MG tablet, Take 1 tablet (10 mg total) by mouth daily.   Current Outpatient Medications (Analgesics):    aspirin EC 81 MG tablet, Take 81 mg by mouth daily. Swallow whole.  Current Outpatient Medications (Hematological):    ELIQUIS 5 MG TABS tablet, Take 1 tablet by mouth twice daily  Current Outpatient Medications (Other):    gabapentin (NEURONTIN) 300 MG capsule, Take 600 mg by mouth at bedtime.   ONETOUCH VERIO test strip, 1 each daily. *For reference purposes while preparing patient instructions.   Delete this med list prior to printing instructions for patient.*  Stop taking Eliquis (Apixiban) on Saturday, June 17.  Stop taking, Lasix (Furosemide)  Monday, June 19,    Do not take Diabetes Med Glucophage (Metformin) on the day of the procedure and HOLD 48 HOURS AFTER THE PROCEDURE.  On the morning of your procedure, take Aspirin and any morning medicines NOT listed above.  You may use sips of water.  5. Plan to go home the same day, you will only stay overnight if medically necessary. 6. You MUST have a responsible adult to drive you home. 7. An adult MUST be with you the first 24 hours after you arrive home. 8. Bring a current list of your medications, and the last time and date medication taken. 9. Bring ID and current insurance cards. 10.Please wear clothes that are easy to get on and off and wear slip-on shoes.  Thank you for allowing [place] to care for you!   -- [facility]   Important Information About Sugar

## 2021-12-10 DIAGNOSIS — E785 Hyperlipidemia, unspecified: Secondary | ICD-10-CM | POA: Diagnosis not present

## 2021-12-10 LAB — BRAIN NATRIURETIC PEPTIDE: BNP: 482.4 pg/mL — ABNORMAL HIGH (ref 0.0–100.0)

## 2021-12-11 ENCOUNTER — Telehealth: Payer: Self-pay | Admitting: *Deleted

## 2021-12-11 LAB — LIPID PANEL
Chol/HDL Ratio: 2.8 ratio (ref 0.0–5.0)
Cholesterol, Total: 98 mg/dL — ABNORMAL LOW (ref 100–199)
HDL: 35 mg/dL — ABNORMAL LOW (ref >39)
LDL Chol Calc (NIH): 46 mg/dL (ref 0–99)
Triglycerides: 88 mg/dL (ref 0–149)
VLDL Cholesterol Cal: 17 mg/dL (ref 5–40)

## 2021-12-11 NOTE — Telephone Encounter (Signed)
Cardiac Catheterization scheduled at [facility] for: Monday December 15, 2021 11 AM Arrival time and place: [facility] Main Entrance A at: 9 AM   Nothing to eat after midnight prior to procedure, clear liquids until 5 AM day of procedure.  Medication instructions: -Hold:  Eliquis-none 12/13/21 until post procedure  Metformin-day of procedure and 48 hours post procedure  Jardiance/Lasix-AM of procedure -Except hold medications usual morning medications can be taken with sips of water including aspirin 81 mg.  Confirmed patient has responsible adult to drive home post procedure and be with patient first 24 hours after arriving home.  Patient reports no new symptoms concerning for COVID-19.  Reviewed procedure instructions with patient.

## 2021-12-12 ENCOUNTER — Ambulatory Visit: Payer: Medicare Other | Admitting: Nurse Practitioner

## 2021-12-15 ENCOUNTER — Encounter (HOSPITAL_COMMUNITY): Admission: RE | Payer: Self-pay | Source: Home / Self Care

## 2021-12-15 ENCOUNTER — Ambulatory Visit (HOSPITAL_COMMUNITY): Admission: RE | Admit: 2021-12-15 | Payer: Medicare Other | Source: Home / Self Care | Admitting: Cardiovascular Disease

## 2021-12-15 ENCOUNTER — Encounter (HOSPITAL_COMMUNITY): Payer: Self-pay | Admitting: Cardiovascular Disease

## 2021-12-15 ENCOUNTER — Encounter (HOSPITAL_COMMUNITY)
Admission: RE | Disposition: A | Discharge status: Home / Self Care | Payer: Self-pay | Source: Home / Self Care | Attending: Cardiovascular Disease

## 2021-12-15 ENCOUNTER — Telehealth: Payer: Self-pay

## 2021-12-15 ENCOUNTER — Ambulatory Visit (HOSPITAL_COMMUNITY)
Admission: RE | Admit: 2021-12-15 | Discharge: 2021-12-15 | Disposition: A | Discharge status: Home / Self Care | Payer: Medicare Other | Attending: Cardiovascular Disease | Admitting: Cardiovascular Disease

## 2021-12-15 ENCOUNTER — Other Ambulatory Visit: Payer: Self-pay

## 2021-12-15 DIAGNOSIS — I5022 Chronic systolic (congestive) heart failure: Secondary | ICD-10-CM | POA: Diagnosis not present

## 2021-12-15 DIAGNOSIS — I25810 Atherosclerosis of coronary artery bypass graft(s) without angina pectoris: Secondary | ICD-10-CM | POA: Diagnosis not present

## 2021-12-15 DIAGNOSIS — Z79899 Other long term (current) drug therapy: Secondary | ICD-10-CM | POA: Insufficient documentation

## 2021-12-15 DIAGNOSIS — I255 Ischemic cardiomyopathy: Secondary | ICD-10-CM | POA: Diagnosis not present

## 2021-12-15 DIAGNOSIS — G4733 Obstructive sleep apnea (adult) (pediatric): Secondary | ICD-10-CM | POA: Diagnosis not present

## 2021-12-15 DIAGNOSIS — E1151 Type 2 diabetes mellitus with diabetic peripheral angiopathy without gangrene: Secondary | ICD-10-CM | POA: Diagnosis not present

## 2021-12-15 DIAGNOSIS — I2584 Coronary atherosclerosis due to calcified coronary lesion: Secondary | ICD-10-CM | POA: Diagnosis not present

## 2021-12-15 DIAGNOSIS — I2582 Chronic total occlusion of coronary artery: Secondary | ICD-10-CM | POA: Diagnosis not present

## 2021-12-15 DIAGNOSIS — Z951 Presence of aortocoronary bypass graft: Secondary | ICD-10-CM

## 2021-12-15 DIAGNOSIS — Z7984 Long term (current) use of oral hypoglycemic drugs: Secondary | ICD-10-CM | POA: Diagnosis not present

## 2021-12-15 DIAGNOSIS — E785 Hyperlipidemia, unspecified: Secondary | ICD-10-CM | POA: Insufficient documentation

## 2021-12-15 DIAGNOSIS — Z7901 Long term (current) use of anticoagulants: Secondary | ICD-10-CM | POA: Insufficient documentation

## 2021-12-15 DIAGNOSIS — I2510 Atherosclerotic heart disease of native coronary artery without angina pectoris: Secondary | ICD-10-CM | POA: Diagnosis not present

## 2021-12-15 DIAGNOSIS — I110 Hypertensive heart disease with heart failure: Secondary | ICD-10-CM | POA: Diagnosis not present

## 2021-12-15 HISTORY — PX: LEFT HEART CATH AND CORS/GRAFTS ANGIOGRAPHY: CATH118250

## 2021-12-15 LAB — GLUCOSE, CAPILLARY: Glucose-Capillary: 157 mg/dL — ABNORMAL HIGH (ref 70–99)

## 2021-12-15 SURGERY — LEFT HEART CATH AND CORS/GRAFTS ANGIOGRAPHY
Anesthesia: LOCAL

## 2021-12-15 SURGERY — LEFT HEART CATH
Anesthesia: Choice

## 2021-12-15 MED ORDER — IOHEXOL 350 MG/ML SOLN
INTRAVENOUS | Status: DC | PRN
  Administered 2021-12-15: 70 mL via INTRA_ARTERIAL

## 2021-12-15 MED ORDER — HYDRALAZINE HCL 20 MG/ML IJ SOLN: 10.0000 mg | INTRAMUSCULAR | Status: DC | PRN

## 2021-12-15 MED ORDER — FENTANYL CITRATE (PF) 100 MCG/2ML IJ SOLN
INTRAMUSCULAR | Status: DC | PRN
Start: 2021-12-15 — End: 2021-12-15
  Administered 2021-12-15: 25 ug via INTRAVENOUS

## 2021-12-15 MED ORDER — SODIUM CHLORIDE 0.9% FLUSH
3.0000 mL | Freq: Two times a day (BID) | INTRAVENOUS | Status: DC
Start: 2021-12-15 — End: 2021-12-15

## 2021-12-15 MED ORDER — HEPARIN (PORCINE) IN NACL 1000-0.9 UT/500ML-% IV SOLN
INTRAVENOUS | Status: AC
Start: 2021-12-15 — End: ?
  Filled 2021-12-15: qty 1000

## 2021-12-15 MED ORDER — MIDAZOLAM HCL 2 MG/2ML IJ SOLN
INTRAMUSCULAR | Status: AC
  Filled 2021-12-15: qty 2

## 2021-12-15 MED ORDER — LIDOCAINE HCL (PF) 1 % IJ SOLN
INTRAMUSCULAR | Status: AC
Start: 2021-12-15 — End: ?
  Filled 2021-12-15: qty 30

## 2021-12-15 MED ORDER — SODIUM CHLORIDE 0.9 % WEIGHT BASED INFUSION
3.0000 mL/kg/h | INTRAVENOUS | Status: AC
  Administered 2021-12-15: 3 mL/kg/h via INTRAVENOUS

## 2021-12-15 MED ORDER — SODIUM CHLORIDE 0.9% FLUSH: 3.0000 mL | INTRAVENOUS | Status: DC | PRN

## 2021-12-15 MED ORDER — MIDAZOLAM HCL 2 MG/2ML IJ SOLN
INTRAMUSCULAR | Status: DC | PRN
  Administered 2021-12-15: 2 mg via INTRAVENOUS

## 2021-12-15 MED ORDER — FENTANYL CITRATE (PF) 100 MCG/2ML IJ SOLN
INTRAMUSCULAR | Status: AC
  Filled 2021-12-15: qty 2

## 2021-12-15 MED ORDER — SODIUM CHLORIDE 0.9 % IV SOLN: 250.0000 mL | INTRAVENOUS | Status: DC | PRN

## 2021-12-15 MED ORDER — ASPIRIN 81 MG PO CHEW: 81.0000 mg | CHEWABLE_TABLET | ORAL | Status: DC

## 2021-12-15 MED ORDER — ACETAMINOPHEN 325 MG PO TABS: 650.0000 mg | ORAL_TABLET | ORAL | Status: DC | PRN

## 2021-12-15 MED ORDER — SODIUM CHLORIDE 0.9 % WEIGHT BASED INFUSION: 1.0000 mL/kg/h | INTRAVENOUS | Status: DC

## 2021-12-15 MED ORDER — ONDANSETRON HCL 4 MG/2ML IJ SOLN: 4.0000 mg | Freq: Four times a day (QID) | INTRAMUSCULAR | Status: DC | PRN

## 2021-12-15 MED ORDER — LIDOCAINE HCL (PF) 1 % IJ SOLN
INTRAMUSCULAR | Status: DC | PRN
  Administered 2021-12-15: 12 mL

## 2021-12-15 MED ORDER — LABETALOL HCL 5 MG/ML IV SOLN: 10.0000 mg | INTRAVENOUS | Status: DC | PRN

## 2021-12-15 MED ORDER — HEPARIN (PORCINE) IN NACL 1000-0.9 UT/500ML-% IV SOLN
INTRAVENOUS | Status: DC | PRN
  Administered 2021-12-15 (×2): 500 mL

## 2021-12-15 SURGICAL SUPPLY — 15 items
CATH INFINITI 5FR JL4 (CATHETERS) IMPLANT
CATH INFINITI 5FR MULTPACK ANG (CATHETERS) ×1 IMPLANT
CLOSURE MYNX CONTROL 5F (Vascular Products) ×1 IMPLANT
ELECT DEFIB PAD ADLT CADENCE (PAD) ×1 IMPLANT
GLIDESHEATH SLEND SS 6F .021 (SHEATH) IMPLANT
GUIDEWIRE INQWIRE 1.5J.035X260 (WIRE) IMPLANT
INQWIRE 1.5J .035X260CM (WIRE)
KIT HEART LEFT (KITS) ×2 IMPLANT
KIT MICROPUNCTURE NIT STIFF (SHEATH) ×1 IMPLANT
PACK CARDIAC CATHETERIZATION (CUSTOM PROCEDURE TRAY) ×2 IMPLANT
SHEATH PINNACLE 5F 10CM (SHEATH) ×1 IMPLANT
SHEATH PROBE COVER 6X72 (BAG) ×1 IMPLANT
TRANSDUCER W/STOPCOCK (MISCELLANEOUS) ×2 IMPLANT
TUBING CIL FLEX 10 FLL-RA (TUBING) ×2 IMPLANT
WIRE EMERALD 3MM-J .035X150CM (WIRE) ×1 IMPLANT

## 2021-12-15 NOTE — Telephone Encounter (Addendum)
Called patient regarding results. Daughter had questions regarding still taking Lasix. Messaged provider regarding questions from patient.----- Message from Lenna Sciara, NP sent at 12/14/2021  2:50 PM EDT ----- Recent labs show fluid volume status is only slightly improved.  For now, continue Lasix at current dose, and proceed with cardiac catheterization as planned. Please notify us if you have worsening lower extremity edema, weight gain of 3 pounds overnight or 5 pounds in a week, or worsening shortness of breath.  Thank you. -EM

## 2021-12-15 NOTE — Interval H&P Note (Signed)
History and Physical Interval Note:  12/15/2021 10:34 AM  [name]  has presented today for surgery, with the diagnosis of cad.  The various methods of treatment have been discussed with the patient and family. After consideration of risks, benefits and other options for treatment, the patient has consented to  Procedure(s): LEFT HEART CATH AND CORS/GRAFTS ANGIOGRAPHY (N/A) as a surgical intervention.  The patient's history has been reviewed, patient examined, no change in status, stable for surgery.  I have reviewed the patient's chart and labs.  Questions were answered to the patient's satisfaction.     [name]

## 2021-12-16 ENCOUNTER — Telehealth: Payer: Self-pay | Admitting: Internal Medicine

## 2021-12-16 DIAGNOSIS — I519 Heart disease, unspecified: Secondary | ICD-10-CM

## 2021-12-16 DIAGNOSIS — I255 Ischemic cardiomyopathy: Secondary | ICD-10-CM

## 2021-12-16 DIAGNOSIS — Z951 Presence of aortocoronary bypass graft: Secondary | ICD-10-CM

## 2021-12-16 DIAGNOSIS — R0600 Dyspnea, unspecified: Secondary | ICD-10-CM

## 2021-12-16 NOTE — Telephone Encounter (Signed)
Cardiac MRI for viability Received: Today [name], [name], MD  Fidel Levy, RN Mr. Degrazia has 2 occluded bypass grafts and significant disease in the left circumflex - Dr. Burt Knack was concerned about viability in this area and elected not to intervene on him. I agree.  I would recommend we order a cardiac MRI for viability - ask Dr. Gardiner Rhyme to read.   Thanks.   -[place]        Previous Messages    ----- Message -----  From: [name], MD  Sent: 12/15/2021  12:32 PM EDT  To: [name], MD; Lenna Sciara, NP   See attached cath note. [place] this is the patient we talked about. Thanks - Ronalee Belts

## 2021-12-16 NOTE — Telephone Encounter (Signed)
Patient notified of recommendations per Dr. Debara Pickett. cMRI ordered + CBC. Patient aware non-fasting lab is needed before test. Message sent to pre-cert.

## 2021-12-17 ENCOUNTER — Telehealth: Payer: Self-pay

## 2021-12-17 NOTE — Telephone Encounter (Signed)
Spoke with pt. Pt was notified of lab results and recommendations.  ?

## 2021-12-17 NOTE — Telephone Encounter (Signed)
Noted  

## 2021-12-17 NOTE — Telephone Encounter (Signed)
noted 

## 2021-12-25 ENCOUNTER — Encounter: Payer: Self-pay | Admitting: *Deleted

## 2021-12-25 MED ORDER — PEG 3350-KCL-NA BICARB-NACL 420 G PO SOLR: ORAL | 0 refills | Status: DC

## 2021-12-25 NOTE — Addendum Note (Signed)
Addended by: Cheron Every on: 12/25/2021 11:44 AM   Modules accepted: Orders

## 2021-12-25 NOTE — Telephone Encounter (Signed)
Spoke with pt. Scheduled for 8/2 at 9:45am. Aware will mail prep instructions/pre-op. Rx for prep sent in. Aware to hold eliquis 2 days prior.

## 2021-12-31 NOTE — Telephone Encounter (Signed)
Noted  

## 2021-12-31 NOTE — Telephone Encounter (Signed)
Called pt. He was agreeable with cancelling. Procedures cancelled until all cardiac testing is done.

## 2022-01-05 NOTE — Progress Notes (Unsigned)
Office Visit    Patient Name: [name] [name] Date of Encounter: 01/06/2022  Primary Care Provider:  [name] [name], MD Primary Cardiologist:  [name], MD  Chief Complaint    76 year old male with a history of CAD s/p CABG x3 (LIMA to LAD, SVG to PDA, SVG to posterolateral branch of RCA) in 6568, chronic systolic heart failure, ICM, paroxysmal atrial fibrillation s/p DCCV in 2020 on Eliquis, bifascicular block, hypertension, orthostatic hypotension, hyperlipidemia, PVD, OSA on BiPAP, type II diabetes, and spinal stenosis who presents for follow-up related to CAD and heart failure.  Past Medical History    Past Medical History:  Diagnosis Date   Arthritis    Knee L - probably   Atrial fibrillation (HCC)    BPH (benign prostatic hyperplasia)    CAD (coronary artery disease) 07/06/2011   CHF (congestive heart failure) (HCC)    Coronary artery disease    Diabetes mellitus    Frequency of urination    Heart failure (HCC)    High cholesterol    History of nuclear stress test 09/2010   dipyridamole; mild perfusion defect due to attenuation with mild superimposed ischemia in apical septal, apical, apical inferior & apical lateral regions; rest LV enlarged in size; prominent gut uptake in infero-apical region; no significant ischemia demonstrated; low risk scan    HNP (herniated nucleus pulposus), lumbar    Hypertension    Ischemic cardiomyopathy    LV dysfunction 07/06/2011   Nocturia    Right bundle branch block    S/P CABG (coronary artery bypass graft) 08/03/2011   x3; LIMA to LAD,, SVG to PDA, SVG to posterolateral branch of RCA; Dr. Ellison Hughs   Sleep apnea    sleep study 10/2010- AHI during total sleep 32.1/hr and during REM 62.3/hr (severe sleep apnea)unable to tolerate c pap   Spinal stenosis of lumbar region    Stroke [facility])    [place]    Past Surgical History:  Procedure Laterality Date   ABDOMINAL AORTOGRAM W/LOWER EXTREMITY Left 11/14/2020   Procedure:  ABDOMINAL AORTOGRAM W/LOWER EXTREMITY;  Surgeon: [name], MD;  Location: [place] [facility];  Service: Cardiovascular;  Laterality: Left;   ABDOMINAL AORTOGRAM W/LOWER EXTREMITY N/A 01/02/2021   Procedure: ABDOMINAL AORTOGRAM W/LOWER EXTREMITY;  Surgeon: [name], MD;  Location: [place] [facility];  Service: Cardiovascular;  Laterality: N/A;   AMPUTATION Left 03/20/2021   Procedure: Left foot 5th ray amputation;  Surgeon: [name], MD;  Location: [place];  Service: Orthopedics;  Laterality: Left;   CARDIAC CATHETERIZATION  01/2011   ischemic cardiomyopathy 30-35%, multivessel CAD (Dr. [name])    CARDIAC CATHETERIZATION  09/13/2015   Procedure: Right/Left Heart Cath and Coronary/Graft Angiography;  Surgeon: [name], MD;  Location: [place] [facility];  Service: Cardiovascular;;   CARDIOVERSION N/A 05/16/2019   Procedure: CARDIOVERSION;  Surgeon: [name], MD;  Location: [place];  Service: Cardiovascular;  Laterality: N/A;   Colonscopy     CORONARY ARTERY BYPASS GRAFT  08/03/2011   Procedure: CORONARY ARTERY BYPASS GRAFTING (CABG);  Surgeon: [name], MD;  Location: [place];  Service: Open Heart Surgery;  Laterality: N/A;  CABG times three using right saphenous vein and left mammary artery usisng endoscope.   LEFT HEART CATH AND CORS/GRAFTS ANGIOGRAPHY N/A 12/15/2021   Procedure: LEFT HEART CATH AND CORS/GRAFTS ANGIOGRAPHY;  Surgeon: [name], MD;  Location: [place] [facility];  Service: Cardiovascular;  Laterality: N/A;  LEFT HEART CATHETERIZATION WITH CORONARY/GRAFT ANGIOGRAM N/A 09/20/2013   Procedure: LEFT HEART CATHETERIZATION WITH [name];  Surgeon: [name], MD;  Location: [facility];  Service: Cardiovascular;  Laterality: N/A;   Lower Extremity Arterial Doppler  03/16/2013   bilat ABIs demonstated normal values; R runoff - posterior tibial & anterior tibial arteries occluded; L runoff - peroneal & posterior tibial  arteries occluded, anterior tibial artery appears occluded   LUMBAR LAMINECTOMY/DECOMPRESSION MICRODISCECTOMY N/A 09/12/2014   Procedure: LUMBAR DECOMPRESSION L3-L4, L4-L5, MICRODISCECTOMY L4-L5;  Surgeon: [name], MD;  Location: [facility];  Service: Orthopedics;  Laterality: N/A;   PERIPHERAL VASCULAR BALLOON ANGIOPLASTY  11/14/2020   Procedure: PERIPHERAL VASCULAR BALLOON ANGIOPLASTY;  Surgeon: [name], MD;  Location: [place] [facility];  Service: Cardiovascular;;   PERIPHERAL VASCULAR BALLOON ANGIOPLASTY Left 01/02/2021   Procedure: PERIPHERAL VASCULAR BALLOON ANGIOPLASTY;  Surgeon: [name], MD;  Location: [place] [facility];  Service: Cardiovascular;  Laterality: Left;  Failed PTA   Pilonidal Cyst removed     TRANSTHORACIC ECHOCARDIOGRAM  11/10/2012   EF 40-45%, mild LVH, mild hypokinesis of anteroseptal myocardium, grade 1 diastolic dysfunction; mild MR & calcifed mitral annulus; LA mild-mod dilated; RA mildly dilated    Allergies  Allergies  Allergen Reactions   Entresto [Sacubitril-Valsartan] Other (See Comments)    dizziness   Sacubitril Other (See Comments)     Dizziness    History of Present Illness    76 year old male with the above past medical history including CAD s/p CABG x3 (LIMA to LAD, SVG to PDA, SVG to posterolateral branch of RCA) in 2585, chronic systolic heart failure, ICM, paroxysmal atrial fibrillation s/p DCCV in 2020 on Eliquis, bifascicular block, hypertension, orthostatic hypotension, hyperlipidemia, PVD, OSA on BiPAP, type II diabetes, and spinal stenosis.   He has a history of CAD and underwent CABG x3 in 2013.  EF was 2025% prior to surgery, however, postsurgery, EF improved to 45 to 50%.  Myoview in 2015 was low risk.  Cardiac catheterization in March 2017 showed two-vessel native CAD with occluded proximal LAD and 85% proximal RCA stenosis, patent grafts s/p CABG x3, EF 30 to 35% with global hypokinesis and inferior akinesis.   Echocardiogram in October 2017 showed EF and slightly improved to 35 to 40%. Additionally, he has a history of PAD, critical left lower limb ischemia, osteomyelitis, following with vascular surgery. He was seen in the office in May 2023 and reported increased dyspnea on exertion, fatigue.  Lasix was increased.  Repeat echocardiogram showed EF 30 to 35%, mild LVH, mild LV cavity dilation, indeterminant diastolic parameters, mild BAE, severe mitral valve regurgitation, regurgitation. Cardiac catheterization was recommended given decrease in EF.  Additionally, colonoscopy was recommended for positive Hemosure.   He was last seen in the office on 12/09/2021 and reported ongoing dyspnea on exertion, generalized fatigue.  He was referred for outpatient cardiac catheterization which revealed severe native coronary artery disease with patency of left main, total occlusion of LAD just after the first diagonal, multiple severe calcific stenoses throughout the entirety of the circumflex distribution, and moderate stenoses in the RCA, s/p CABG with continued patency of LIMA-LAD, and occlusion of SVG-PDA and SVG-PLA, normal LVEDP.  His case was reviewed with heart team for consideration of medical therapy versus complex atherectomy and intervention of the left circumflex which will involve stenting back into the left main and covering a long segment throughout the circumflex in order to treat multiple severe calcific lesions.  Cardiac  MRI was recommended for viability study and is pending.   He presents today for follow-up accompanied by his daughter. Since his last visit and since his procedure he has been stable from a cardiac standpoint. He does report ongoing generalized fatigue, mild dyspnea with exertion, occasional lightheadedness.  He is eager to proceed with his cardiac MRI to determine next steps treatment of his known CAD. He denies edema, PND, orthopnea, denies palpitations. He states he stopped taking his Crestor  due to side effects.  Additionally, he states he is having increased diarrhea up to 3 times daily with Zetia. He is asking about alternative lipid-lowering therapy. Overall, his symptoms have been been stable and he denies any new concerns today.  Of note, he is requesting that his neck scheduled appointment be with Dr. Debara Pickett.   Home Medications    Current Outpatient Medications  Medication Sig Dispense Refill   acetaminophen (TYLENOL) 500 MG tablet Take 500-1,000 mg by mouth every 6 (six) hours as needed (for pain.).     aspirin EC 81 MG tablet Take 81 mg by mouth at bedtime. Swallow whole.     carvedilol (COREG) 6.25 MG tablet Take 1 tablet by mouth twice daily 180 tablet 0   ELIQUIS 5 MG TABS tablet Take 1 tablet by mouth twice daily 180 tablet 1   empagliflozin (JARDIANCE) 10 MG TABS tablet Take 1 tablet (10 mg total) by mouth daily before breakfast. 30 tablet 11   ezetimibe (ZETIA) 10 MG tablet Take 1 tablet (10 mg total) by mouth daily. (Patient taking differently: Take 10 mg by mouth at bedtime.) 90 tablet 3   furosemide (LASIX) 40 MG tablet Take 40 mg by mouth in the morning and at bedtime.     gabapentin (NEURONTIN) 300 MG capsule Take 600 mg by mouth at bedtime.     glipiZIDE (GLUCOTROL XL) 10 MG 24 hr tablet Take 10 mg by mouth at bedtime.     metFORMIN (GLUCOPHAGE-XR) 500 MG 24 hr tablet Take 1,000 mg by mouth 2 (two) times daily.     ONETOUCH VERIO test strip 1 each daily.     polyethylene glycol-electrolytes (NULYTELY) 420 g solution As directed 4000 mL 0   potassium chloride (KLOR-CON) 20 MEQ packet Take 20 mEq by mouth 2 (two) times daily.     No current facility-administered medications for this visit.     Review of Systems    He denies chest pain, palpitations, pnd, orthopnea, n, v, dizziness, syncope, edema, weight gain, or early satiety. All other systems reviewed and are otherwise negative except as noted above.   Physical Exam    VS:  BP 108/60 (BP Location: Left  Arm, Patient Position: Sitting, Cuff Size: Normal)   Pulse (!) 58   Resp 20   Ht '6\' 1"'$  (1.854 m)   Wt 215 lb (97.5 kg)   SpO2 97%   BMI 28.37 kg/m  GEN: Well nourished, well developed, in no acute distress. HEENT: normal. Neck: Supple, no JVD, carotid bruits, or masses. Cardiac: RRR, no murmurs, rubs, or gallops. No clubbing, cyanosis, edema.  Radials/DP/PT 2+ and equal bilaterally.  Right groin cath site without bruising, bleeding, or hematoma. Respiratory:  Respirations regular and unlabored, clear to auscultation bilaterally. GI: Soft, nontender, nondistended, BS + x 4. MS: no deformity or atrophy. Skin: warm and dry, no rash. Neuro:  Strength and sensation are intact. Psych: Normal affect.  Accessory Clinical Findings    ECG personally reviewed by me today -sinus bradycardia, 58  bpm, sinus arrhythmia, first-degree AV block, occasional PVCs, RBBB, LAFB- no acute changes.  Lab Results  Component Value Date   WBC 6.4 12/08/2021   HGB 13.0 12/08/2021   HCT 40.5 12/08/2021   MCV 86 12/08/2021   PLT 198 12/08/2021   Lab Results  Component Value Date   CREATININE 1.18 12/08/2021   BUN 22 12/08/2021   NA 142 12/08/2021   K 4.1 12/08/2021   CL 101 12/08/2021   CO2 25 12/08/2021   Lab Results  Component Value Date   ALT 13 11/17/2021   AST 13 11/17/2021   ALKPHOS 147 (H) 11/17/2021   BILITOT 1.2 11/17/2021   Lab Results  Component Value Date   CHOL 98 (L) 12/10/2021   HDL 35 (L) 12/10/2021   LDLCALC 46 12/10/2021   TRIG 88 12/10/2021   CHOLHDL 2.8 12/10/2021    Lab Results  Component Value Date   HGBA1C 7.0 (H) 10/17/2020    Assessment & Plan    1. CAD: S/p CABG x3 (LIMA to LAD, SVG to PDA, SVG to posterolateral branch of RCA) in 2013. Cath in March 2017 showed two-vessel native CAD with occluded proximal LAD and 85% proximal RCA stenosis, patent grafts s/p CABG x3, EF 30 to 35% with global hypokinesis and inferior akinesis. Repeat echo in May 2023 in the  setting of fatigue, increased dyspnea on exertion, fluid volume overload, showed EF 30 to 35%, mild LVH, mild LV cavity dilation, indeterminant diastolic parameters, mild BAE, severe mitral valve regurgitation, regurgitation. Outpatient cath in 11/2021 revealed severe native coronary artery disease with patency of left main, total occlusion of LAD just after the first diagonal, multiple severe calcific stenoses throughout the entirety of the circumflex distribution, and moderate stenoses in the RCA, s/p CABG with continued patency of LIMA-LAD, and occlusion of SVG-PDA and SVG-PLA, normal LVEDP.  Cardiac MRI pending for viability study to determine medical management versus complex atherectomy/PCI.  He does report ongoing fatigue, dyspnea on exertion however, his symptoms have been stable.  Continue aspirin, carvedilol, Jardiance, Lasix.  Will refer to lipid clinic Pharm.D. as below.   2. Chronic systolic heart failure/ICM: Fluid volume status improved with Lasix.  Of note, he did not tolerate Entresto due to dizziness. Further escalation of GDMT limited in the setting of borderline BP. Continue current medications as above.   3. Paroxysmal atrial fibrillation:  S/p DCCV in 2020, on Eliquis. Maintaining NSR as bleeding on Eliquis.  Continue carvedilol, Eliquis.   4. Hypertension/h/o orthostatic hypotension: BP well controlled. Continue current antihypertensive regimen.    5. Hyperlipidemia: LDL was 68 in March 2023.  He stopped taking Crestor due to side effects, additionally, he reports increased diarrhea with Zetia.  Previously failed atorvastatin.  Given he has not tolerated 2 different statins, will refer to lipid clinic Pharm.D. for consideration of PCSK9 inhibitor.  6. PAD: With critical left lower limb ischemia, h/o osteomyelitis. Following with vascular surgery.    7. OSA on BiPAP: Denies any concerns today.   8. Type II diabetes: A1c was 7.1 in March 2023.  Monitored and managed per PCP.   9.  Disposition: F/u in 1 month with Dr. Debara Pickett (patient requests Dr. Debara Pickett only).  Lenna Sciara, NP 01/06/2022, 12:40 PM

## 2022-01-06 ENCOUNTER — Encounter: Payer: Self-pay | Admitting: Nurse Practitioner

## 2022-01-06 ENCOUNTER — Ambulatory Visit (INDEPENDENT_AMBULATORY_CARE_PROVIDER_SITE_OTHER): Payer: Medicare Other | Admitting: Nurse Practitioner

## 2022-01-06 VITALS — BP 108/60 | HR 58 | Resp 20 | Ht 73.0 in | Wt 215.0 lb

## 2022-01-06 DIAGNOSIS — I5022 Chronic systolic (congestive) heart failure: Secondary | ICD-10-CM

## 2022-01-06 DIAGNOSIS — I255 Ischemic cardiomyopathy: Secondary | ICD-10-CM | POA: Diagnosis not present

## 2022-01-06 DIAGNOSIS — I739 Peripheral vascular disease, unspecified: Secondary | ICD-10-CM | POA: Diagnosis not present

## 2022-01-06 DIAGNOSIS — I10 Essential (primary) hypertension: Secondary | ICD-10-CM

## 2022-01-06 DIAGNOSIS — G4733 Obstructive sleep apnea (adult) (pediatric): Secondary | ICD-10-CM

## 2022-01-06 DIAGNOSIS — I2510 Atherosclerotic heart disease of native coronary artery without angina pectoris: Secondary | ICD-10-CM | POA: Diagnosis not present

## 2022-01-06 DIAGNOSIS — E119 Type 2 diabetes mellitus without complications: Secondary | ICD-10-CM | POA: Diagnosis not present

## 2022-01-06 DIAGNOSIS — I951 Orthostatic hypotension: Secondary | ICD-10-CM

## 2022-01-06 DIAGNOSIS — E785 Hyperlipidemia, unspecified: Secondary | ICD-10-CM | POA: Diagnosis not present

## 2022-01-06 NOTE — Patient Instructions (Signed)
Medication Instructions:  Your physician recommends that you continue on your current medications as directed. Please refer to the Current Medication list given to you today.   *If you need a refill on your cardiac medications before your next appointment, please call your pharmacy*   Lab Work: NONE ordered at this time of appointment   If you have labs (blood work) drawn today and your tests are completely normal, you will receive your results only by: [place] (if you have MyChart) OR A paper copy in the mail If you have any lab test that is abnormal or we need to change your treatment, we will call you to review the results.   Testing/Procedures: NONE ordered at this time of appointment. Complete Cardiac MRI as directed     Follow-Up: At [facility], you and your health needs are our priority.  As part of our continuing mission to provide you with exceptional heart care, we have created designated Provider Care Teams.  These Care Teams include your primary Cardiologist (physician) and Advanced Practice Providers (APPs -  Physician Assistants and Nurse Practitioners) who all work together to provide you with the care you need, when you need it.  We recommend signing up for the patient portal called "MyChart".  Sign up information is provided on this After Visit Summary.  MyChart is used to connect with patients for Virtual Visits (Telemedicine).  Patients are able to view lab/test results, encounter notes, upcoming appointments, etc.  Non-urgent messages can be sent to your provider as well.   To learn more about what you can do with MyChart, go to [email or web address].    Your next appointment:   1 month(s)  The format for your next appointment:   In Person  Provider:   [name], MD     Other Instructions Referral to PharmD  Important Information About [place]

## 2022-01-07 ENCOUNTER — Telehealth: Payer: Self-pay | Admitting: Internal Medicine

## 2022-01-07 NOTE — Telephone Encounter (Signed)
Called pt instead of his daughter, mistakenly. He states his daughter wants to come with him to his appointment but is out of town. After speaking with Clarnce Flock, pt informed his appointment is an  add on and Dr. Debara Pickett ill be out off office following that time. He verbalized understanding and states he will be at the 7/26 appointment.

## 2022-01-07 NOTE — Telephone Encounter (Signed)
Patient's daughter wants to discuss the patient's appointment 7/26. She states she has a question about the appointment.

## 2022-01-12 ENCOUNTER — Other Ambulatory Visit: Payer: Self-pay | Admitting: Internal Medicine

## 2022-01-14 ENCOUNTER — Encounter: Payer: Self-pay | Admitting: Internal Medicine

## 2022-01-16 ENCOUNTER — Telehealth (HOSPITAL_COMMUNITY): Payer: Self-pay | Admitting: *Deleted

## 2022-01-16 NOTE — Telephone Encounter (Signed)
Reaching out to patient to offer assistance regarding upcoming cardiac imaging study; pt verbalizes understanding of appt date/time, parking situation and where to check in,  and verified current allergies; name and call back number provided for further questions should they arise  Gordy Clement RN [facility] [phone] office [phone] cell  Patient states he has had MRIs in the past without issues.

## 2022-01-19 ENCOUNTER — Ambulatory Visit (HOSPITAL_COMMUNITY)
Admission: RE | Admit: 2022-01-19 | Discharge: 2022-01-19 | Disposition: A | Discharge status: Home / Self Care | Payer: Medicare Other | Source: Ambulatory Visit | Attending: Internal Medicine | Admitting: Internal Medicine

## 2022-01-19 DIAGNOSIS — R0600 Dyspnea, unspecified: Secondary | ICD-10-CM | POA: Diagnosis not present

## 2022-01-19 DIAGNOSIS — I255 Ischemic cardiomyopathy: Secondary | ICD-10-CM

## 2022-01-19 DIAGNOSIS — Z951 Presence of aortocoronary bypass graft: Secondary | ICD-10-CM

## 2022-01-19 DIAGNOSIS — I519 Heart disease, unspecified: Secondary | ICD-10-CM

## 2022-01-19 MED ORDER — GADOBUTROL 1 MMOL/ML IV SOLN
10.0000 mL | Freq: Once | INTRAVENOUS | Status: AC | PRN
Start: 2022-01-19 — End: 2022-01-19
  Administered 2022-01-19: 10 mL via INTRAVENOUS

## 2022-01-21 ENCOUNTER — Ambulatory Visit (INDEPENDENT_AMBULATORY_CARE_PROVIDER_SITE_OTHER): Payer: Medicare Other | Admitting: Internal Medicine

## 2022-01-21 ENCOUNTER — Encounter: Payer: Self-pay | Admitting: Internal Medicine

## 2022-01-21 VITALS — BP 110/60 | HR 62 | Ht 74.0 in | Wt 216.8 lb

## 2022-01-21 DIAGNOSIS — I2510 Atherosclerotic heart disease of native coronary artery without angina pectoris: Secondary | ICD-10-CM

## 2022-01-21 DIAGNOSIS — Z951 Presence of aortocoronary bypass graft: Secondary | ICD-10-CM

## 2022-01-21 DIAGNOSIS — I255 Ischemic cardiomyopathy: Secondary | ICD-10-CM

## 2022-01-21 DIAGNOSIS — I480 Paroxysmal atrial fibrillation: Secondary | ICD-10-CM

## 2022-01-21 DIAGNOSIS — I5022 Chronic systolic (congestive) heart failure: Secondary | ICD-10-CM

## 2022-01-21 MED ORDER — SPIRONOLACTONE 25 MG PO TABS: 12.5000 mg | ORAL_TABLET | Freq: Every day | ORAL | 3 refills | Status: DC

## 2022-01-21 NOTE — Patient Instructions (Addendum)
Medication Instructions:  DECREASE potassium to 38mq ONCE daily START spironolactone 12.'5mg'$  daily  CONTINUE all other current medications  *If you need a refill on your cardiac medications before your next appointment, please call your pharmacy*   Follow-Up: [facility].  As part of our continuing mission to provide you with exceptional heart care, we have created designated Provider Care Teams.  These Care Teams include your primary Cardiologist (physician) and Advanced Practice Providers (APPs -  Physician Assistants and Nurse Practitioners) who all work together to provide you with the care you need, when you need it.  We recommend signing up for the patient portal called "MyChart".  Sign up information is provided on this After Visit Summary.  MyChart is used to connect with patients for Virtual Visits (Telemedicine).  Patients are able to view lab/test results, encounter notes, upcoming appointments, etc.  Non-urgent messages can be sent to your provider as well.   To learn more about what you can do with MyChart, go to [email or web address]    Your next appointment:   3 month(s)  The format for your next appointment:   In Person  Provider:   KPixie Casino MD {   Other Instructions You have been referred to [facility]rophysiologist  -- Dr. WReggy EyeOR Dr. CLars Mage

## 2022-01-21 NOTE — Progress Notes (Signed)
OFFICE NOTE  Chief Complaint:  Follow-up cMRI  Primary Care Physician: [name] [name], MD  HPI:  [name] [name] is a pleasant 76 year old gentleman previously followed by Dr. Rollene Fare. His past medical history is significant for coronary artery disease. In 2013 he underwent multivessel CABG for an ischemic cardiomyopathy. EF was 20-25% prior to surgery however post surgery his EF had improved up to 45-50%. Recently his EF was 40-45% by echo in May of 2014. There was mild mitral regurgitation, mild to moderately dilated left atrium and mild LVH. [name] [name] has been describing some anxiety. He also reports some pain in his legs however underwent Dopplers in September 2014 which showed preserved ABIs bilaterally a 1.0 on the right and 1.1 on the left. The bilateral peroneal and posterior tibial arteries were occluded. He reports some optimal control of his diabetes. His A1c was 7.2. He is concerned about the cost of taking Tradjenta. He is followed by Dr. Elyse Hsu.   He was recently seen in the emergency room and he can hospital. There he presented with hypotension and was given 1 L normal saline and his symptoms improved. He had been having dizziness as well as some upper back pain into the left shoulder blade with exertion recently. The symptoms are worse particularly when climbing up ladders or going up stairs and are relieved at rest. After that hospitalization it was recommended that he discontinue his ACE inhibitor and beta blocker, and his blood pressure appears improved today up to 110/60.  [name] [name] returns today for followup of his stress test. This is interpreted as nonischemic with an EF of 44%. He reports still feeling very fatigued and extremely short of breath when doing activities. He says that the other day he tried to hit golf balls and felt that he was totally exhausted after just an hour. He reports his blood pressure has improved somewhat off of all medications however  remains low. He has had symptoms of orthostatic hypotension in the past. His symptoms do feel similar to prior to his bypass surgery. He also feels that he was much better after surgery than he is now and that his symptoms have been going on for the past 2 months.  At his last office visit, I recommended that [name] [name] have a repeat cardiac catheterization (08/2013). This demonstrated the following:  Impression:  1. 2 vessel native CAD with occluded LAD in the mid-vessel  2. Patent LIMA-LAD, SVG to PLA and SVG to PDA grafts with TIMI III flow  3. LVEDP = 13 mmHg  4. Random PVC's were noted  Based on these findings, I could not find any new cardiac cause of his symptoms. I suspect that some of his fatigue could be related to labile blood sugars. He says unexplained hypotension but is normotensive off of medications.  [name] [name] returns today for followup. He now reports feeling better since he had change in his medications. He is established with Dr. Buddy Duty who adjusted his diabetes medicines and stopped his Invokana. His shortness of breath and fatigue in both improved. He is now been expected some problems with left heel pain. He was found to have some spinal stenosis but has had improvement with inserts in his shoes.  I saw [name] [name] back in the office today. He has successfully underwent back surgery earlier this year which she says initially was much improved, however he says he subsequently developed more pain going down his legs. This sounds like it's  neuropathic pain, but is reluctant to try medication for it. He also significantly fatigue. This could be due to a number of etiologies. 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 Also, he is noted to have a history of obstructive sleep apnea. He was placed on BiPAP, but has not been compliant with that due to difficulty wearing the mask. He also  never had follow-up of his sleep study.  [name] returns today for follow-up. He reports he's had worsening dyspnea and fatigue. He says that he can hardly sleep at night. He is struggling with significant anxiety. 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 He's not been wearing the BiPAP due to significant anxiety and difficulty sleeping. He feels like he gets smothered with his machine. Communication between Dr. Radford Pax and Dr. Quintin Alto led to the starting of Celexa as well as Klonopin to use as needed at night. He says that it does give him about 6 hours of sleep. He has not been using his BiPAP as instructed. He is reportedly had more worsening shortness of breath. He was seen in the ER for this and it was thought this was due [email or web address] with BiPAP. His BNP however is elevated over 300. Today in the office he says that he's had more swelling and more abdominal fullness as well as leg edema.  I had the pleasure seeing [name] [name] back today in the office. He seems to respond nicely to Lasix. I started him on 40 mg daily and while I was out of town my partner reviewed his lab work which included an elevated BNP over 300. He increased his Lasix to 40 mg twice a day. [name] [name] says he's had a significant improvement in his breathing. The BNP now is in the mid 200s. Renal function is stable based on labs today. Unfortunately, his echocardiogram does show a new cardiomyopathy with EF 30-35%. It should be noted that he felt "awful" on blood pressure/heart failure medicines and had taken himself off of ACE inhibitor and beta blocker in the past. The echo however does suggest inferior and anterolateral wall motion abnormalities which are new and could indicate graft dysfunction. I performed his last heart catheterization in 2015 which showed all 3 grafts patent.  [name] [name] returns today for follow-up of his left heart catheterization. I perform  this a few weeks ago and he had patent bypass grafts however LV function is reduced. Cardiac output is also reduced. Filling pressures were mildly elevated. He reports since discharge that he's had some improvement of his symptoms. He was started on low-dose beta blocker but he does report fatigue. When I asked him how they compared to previously he said he thinks attack sleep better since he started on the beta blocker but not back to what he thinks his baseline should be. He continues to have problems with left leg pain. He had ultrasound of the left leg which were unrevealing. He also had nerve conduction studies which I sent him for which were not suggestive of neuropathy. His symptoms were worse while lying flat on the Cath Lab table and I suspect this could be again coming from his back and he may need repeat orthopedic evaluation. His primary care provider started him on Celexa recently as well for anxiety.  12/23/2015  [name] [name] was seen back in the office  today in follow-up. Overall he seems to be doing well. He's tolerating low-dose carvedilol and has had some very infrequent morning dizziness. He was previously on Lasix 40 mg twice a day but ran out of the medicine about 8 days ago. He's not had any worsening weight gain or swelling nor worsening shortness of breath over the past week. This could be that he's had some improvement in his cardiomyopathy. Nevertheless, I would like him to be on some Lasix at least until we can determine whether or not he is euvolemic with lab work.  05/27/2016  [name] [name] returns today for follow-up. He's gained about 6 pounds of weight since I last saw him. He denies any worsening shortness of breath or orthostatic dizziness. He saw Dr. Radford Pax in August for following of his obstructive sleep apnea. He remains physically active and denies any chest pain. He recently had a repeat echo in October 2017 which showed a small improvement in LV function with EF up to  35-40%. Heart failure regimen includes aspirin, carvedilol, Crestor, and spironolactone. He is not currently on a ARB is he's had orthostatic symptoms in the past although that his generally resolved. His creatinine is normal.  08/24/2016  I saw [name] [name] today in follow-up. He again gained about 4 pounds over the past month. He felt that the weight gain was heart failure related because he got more short of breath particularly walking up stairs. Based on that he increased his Lasix back to 40 mg daily. He notes that his urine is been darker and his urination is not necessarily improved. He's also been more dizzy. He says he gets somewhat presyncopal with change in position. Lab work 12 days ago indicated a stable creatinine however. Despite this, I feel that he may be somewhat presyncopal perhaps on too high of a dose of diuretics. He is also on Entresto 24/26 and Aldactone 12.5 mg daily. He also complained of some pain across the back between the shoulder blades. He said this got better with resting and drinking water. It's difficult to say whether that it was the water or perhaps resting from his activities. I cannot exclude that this could be angina. His EKG however is reassuring today.  09/21/2016  Mr. Washko was seen today in follow-up. I decreased his Lasix, however he does not feel any change in his dizziness. There is no evidence of presyncope. He has gained about 4 pounds which I suspect is some fluid retention. Blood pressure is well-controlled today 118/56.  02/26/2017  Mr. Mavis returns today for follow-up. He had more recent dizziness and it seemed to worsen on Entresto. He discontinued that and feels that he's now much better. Overall he says he feels well. He is not on ACE inhibitor or ARB but remains on carvedilol. Is also on Lasix 40 mg daily. Blood pressure is stable 122/52.  08/10/2017  Mr. Trauger returns for follow-up.  Overall he seems to be feeling very well.  Denies any chest  pain or worsening shortness of breath.  He gets occasional dizziness but that is much improved.  Of note an EKG was performed again today and there is a suggestion that there may be underlying atrial flutter.  Heart rate was 74 and EKG looks very similar to his prior EKG.  The computer has interpreted a sinus rhythm with first-degree AV block and bifascicular block.  I compared EKG to his previous EKG last August which looks similar however it is distinctly different from the EKG  last of February.  He is asymptomatic, but not anticoagulated.  Otherwise, labs reviewed from October indicate total cholesterol 134, HDL 36 LDL 67 and triglycerides 157.  Hemoglobin A1c of 7.6 and serum creatinine 0.9.  08/30/2017  Mr. Heckard returns today for follow-up of his studies.  He underwent an echocardiogram to evaluate for change in LVEF, but more importantly to rule out atrial flutter.  His EKG was abnormal and suggested possible atrial flutter, however he was asymptomatic.  The echo did not show any evidence of atrial flutter.  LVEF was stable at 40%.  He also underwent a home 48-hour Holter monitor.  This demonstrated sinus rhythm with PACs and PVCs, but no evidence of atrial fibrillation or flutter.  Remains asymptomatic.  His only other complaint today is chronic leg pain.  He was seen by Dr. Gwenlyn Found and evaluated with lower extremity arterial Dopplers.  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  A referral to neurosurgery was suggested but not placed.  01/24/2018  Mr. Wojdyla returns today for follow-up.  Overall he says he is doing really well.  He underwent 2 back injections and has had improvement in his back pain and leg pain.  He had an episode in June where he became somewhat dehydrated.  He presented to the ER with orthostatic hypotension.  His Lasix was cut back to 20 mg daily.  Weight is stayed stable effect is been improved from 252-246.  He says he is actually had  enough energy to play golf recently.  08/01/2018  Mr. Hawe seen today in routine follow-up.  He has some occasional dizziness which is persistent.  He says he gets a little lightheaded when he urinates.  He has been off of tamsulosin with no specific benefit and now is taking the medication every third day.  Blood pressure is stable.  Weight is stable.  He denies any chest pain or worsening shortness of breath with exertion.  His last echo showed an EF of 40% which is stable if not slightly improved from an echo in 2017.  Unfortunately he could not tolerate Entresto and remains on carvedilol.  He endorses NYHA class I-II heart failure symptoms.  Recent lab work showed total cholesterol 151, HDL 39, LDL 79 triglycerides 166.  Hemoglobin A1c of 7.3.  03/30/2019  Mr. Schroepfer seen today in follow-up.  Overall he is doing well and has no complaints.  He has not recently struggled with any of the dizziness he has had previously.  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  In the past he has had EKG changes that were questionable for possible atrial flutter however monitoring as well as an echocardiogram failed to show any evidence of atrial flutter or fibrillation.  This is now clear finding of atrial fibrillation on EKG with an irregularly irregular rhythm with no evidence of P waves.  He reports being asymptomatic with this.  05/12/2019   Mr. Lua returns today for follow-up of his A. fib.  He reports he has been compliant with Eliquis.  He says he had a couple episodes of loose stools.  He thought it might be due to the medicine but this seems quite atypical.  He stopped some of his stool softener/bulking agents.  Weight is down about 3 pounds.  He increased his Lasix because he felt like it was a little wheezy.  Possibly could have had some more heart failure because he is  in A. fib and does have a history of heart failure.  EKG again shows atrial  flutter/fibrillation with variable AV response at 74.  Since this is presumably persistent at this point we discussed an elective cardioversion and he is agreeable to this.  06/13/2019  Mr. Bonura was seen today in follow-up.  He underwent cardioversion by myself in November.  Since then he has felt fairly well.  His EKG today shows that he is maintaining sinus rhythm.  Blood pressure was 119/63 however he has had some positional dizziness at home.  His daughter notes he has had some blood pressures with low diastolics less than 50 and he does feel some dizziness.  He also feels some fatigue when exerting himself a lot.  I suspect this could be due to hypotension.  Is possible also that his EF has improved now that he is back in sinus and that he may be over diuresed being on both Lasix and Aldactone.  09/15/2019  RandallWalrath returns for follow-up.  He reports improvement in his orthostatic symptoms after stopping Aldactone and his PCP also stopped tamsulosin.  He is now on some saw palmetto for BPH.  Unfortunately he does get a little bit more short of breath and I wonder if this is due to need for more diuretic.  His LVEF was 40% last year however has not been reassessed.  I would like to get an echo to recheck that.  03/25/2020  Mr. Rabalais is seen today for 35-monthfollow-up.  He denies any further orthostatic symptoms.  He was not feeling well on Aldactone.  He was also taken off tamsulosin.  He is only on carvedilol for heart failure and unfortunately his LVEF declined further in March down to 5 to 40%, previously 40 to 45%.  I had increased his Lasix due to signs of increased LV filling pressure however then he became a little bit prerenal.  His Lasix was then decreased to 40 mg every morning and 20 mg every afternoon.  Since then he has been asymptomatic with heart failure at most with NYHA class II symptoms.  Is been struggling with issues with the CPAP and that the humidifier broke.  This is a  [employer] device and he was told by the company that they are no longer manufacturing them rather they are making ventilators because of COVID-19.  Lipid profile in June 2021 showed total cholesterol 168, HDL 32, triglycerides 106 and 116.  A1c was 7.7 and both remain above goal.  09/20/2020  Mr. TLocontereturns today for follow-up.  Overall he seems to be doing pretty well.  He struggled with a small wound under his left fifth metatarsal.  He is going to the wound center on April 1.  EF had declined somewhat down to 40 to 45%.  Unfortunately has been intolerant of Entresto and losartan more recently causing worsening fatigue.  His options for heart failure medications have been limited.  He is a diabetic with recent A1c at 6.8.  We discussed today the possibility of adding an SGLT2 inhibitor based on positive data in heart failure and diabetes.  01/01/2021  [name] [name] seen today in follow-up.  He seems somewhat desponded today.  He says he has a lot going on and and unfortunately has been dealing with wound that is nonhealing in his left leg.  He said that even working with the wound center he has developed osteomyelitis.  There talk about possible amputation.  He has been noted to  have some mild reduction in blood flow to that leg.  He is scheduled to actually have a retrograde angiogram tomorrow with [name].  He is also going to see Dr. Baxter Flattery with infectious diseases.  Hopefully they will be able to get control of this before having to consider an amputation.  He does say that he feels better though.  He had come off of alfuzosin which she was taking by his urologist and he now reports being less dizzy.  Also his blood sugars are significantly lower after starting the Jardiance.  08/14/2021  [name] [name] returns today for follow-up.  He seems to be doing much better.  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  He still struggles with some balance issues but is done better.  He had 1 episode where he became dizzy or lightheaded in January when he was playing golf in [place].  This seemed to resolve with rest and drinking some water.  He has done well with the Jardiance.  11/17/2021  [name] [name] is seen today as an acute add-on for shortness of breath.  He reports several weeks of worsening shortness of breath and difficulty breathing.  This past weekend it was difficult for him to sleep.  He called and noted that he increased his Lasix from 40 to 60 mg daily.  I advised him to stay on that because of his meeting today.  He reports over the past couple days he has started to diurese a little more.  He is swelling has improved but his breathing is still somewhat labored and he is fatigued.  Blood pressure was normal today.  Oxygen at saturation 96%.  He had repeat lipids in March, which showed a total cholesterol 172, triglycerides 86, HDL 38 and LDL lower at 118.  EKG today shows normal sinus rhythm.  01/21/2022  [name] [name] returns today for follow-up.  He is feeling much better after treatment of his heart failure.  He is down about 30 pounds of fluid.  He is currently on twice daily Lasix.  His echo had shown an LVEF of 30 to 35%.  Subsequently underwent heart catheterization by Dr. Burt Knack on 12/15/2021 which showed severe native coronary artery disease with patent left main and total occlusion of the LAD after the first diagonal.  The LIMA to LAD was patent.  There was interval occlusion of the SVG to PDA and SVG to PLA.  LVEDP was normal.  Given the fact that he had akinesis of the inferior and posterior walls and lateral walls on echo, the decision was made not to consider intervention of the native left circumflex.  We then pursued viability evaluation with cardiac MRI performed on 01/19/2022, this demonstrated severe LV dilatation with severe systolic dysfunction and LVEF  28%.  There was thinning and akinesis of the inferior and lateral walls.  The RV was mildly dilated with mild systolic dysfunction and RV EF 40%.  No LGE was noted however certain severe thinning of the apical and basal lateral wall and apical inferior wall suggested nonviability.  He was noted to have moderate mitral and tricuspid regurgitation.  PMHx:  Past Medical History:  Diagnosis Date   Arthritis    Knee L - probably   Atrial fibrillation (HCC)    BPH (benign prostatic hyperplasia)    CAD (coronary artery disease) 07/06/2011  CHF (congestive heart failure) (HCC)    Coronary artery disease    Diabetes mellitus    Frequency of urination    Heart failure (HCC)    High cholesterol    History of nuclear stress test 09/2010   dipyridamole; mild perfusion defect due to attenuation with mild superimposed ischemia in apical septal, apical, apical inferior & apical lateral regions; rest LV enlarged in size; prominent gut uptake in infero-apical region; no significant ischemia demonstrated; low risk scan    HNP (herniated nucleus pulposus), lumbar    Hypertension    Ischemic cardiomyopathy    LV dysfunction 07/06/2011   Nocturia    Right bundle branch block    S/P CABG (coronary artery bypass graft) 08/03/2011   x3; LIMA to LAD,, SVG to PDA, SVG to posterolateral branch of RCA; Dr. Ellison Hughs   Sleep apnea    sleep study 10/2010- AHI during total sleep 32.1/hr and during REM 62.3/hr (severe sleep apnea)unable to tolerate c pap   Spinal stenosis of lumbar region    Stroke [facility])    [place]     Past Surgical History:  Procedure Laterality Date   ABDOMINAL AORTOGRAM W/LOWER EXTREMITY Left 11/14/2020   Procedure: ABDOMINAL AORTOGRAM W/LOWER EXTREMITY;  Surgeon: [name], MD;  Location: [place] [facility];  Service: Cardiovascular;  Laterality: Left;   ABDOMINAL AORTOGRAM W/LOWER EXTREMITY N/A 01/02/2021   Procedure: ABDOMINAL AORTOGRAM W/LOWER EXTREMITY;  Surgeon: [name], MD;  Location: [place] [facility];  Service: Cardiovascular;  Laterality: N/A;   AMPUTATION Left 03/20/2021   Procedure: Left foot 5th ray amputation;  Surgeon: [name], MD;  Location: [place];  Service: Orthopedics;  Laterality: Left;   CARDIAC CATHETERIZATION  01/2011   ischemic cardiomyopathy 30-35%, multivessel CAD (Dr. [name])    CARDIAC CATHETERIZATION  09/13/2015   Procedure: Right/Left Heart Cath and Coronary/Graft Angiography;  Surgeon: [name], MD;  Location: [place] [facility];  Service: Cardiovascular;;   CARDIOVERSION N/A 05/16/2019   Procedure: CARDIOVERSION;  Surgeon: [name], MD;  Location: [place];  Service: Cardiovascular;  Laterality: N/A;   Colonscopy     CORONARY ARTERY BYPASS GRAFT  08/03/2011   Procedure: CORONARY ARTERY BYPASS GRAFTING (CABG);  Surgeon: [name], MD;  Location: [place];  Service: Open Heart Surgery;  Laterality: N/A;  CABG times three using right saphenous vein and left mammary artery usisng endoscope.   LEFT HEART CATH AND CORS/GRAFTS ANGIOGRAPHY N/A 12/15/2021   Procedure: LEFT HEART CATH AND CORS/GRAFTS ANGIOGRAPHY;  Surgeon: [name], MD;  Location: [place] [facility];  Service: Cardiovascular;  Laterality: N/A;   LEFT HEART CATHETERIZATION WITH CORONARY/GRAFT ANGIOGRAM N/A 09/20/2013   Procedure: LEFT HEART CATHETERIZATION WITH [name];  Surgeon: [name], MD;  Location: [facility] C[facility];  Service: Cardiovascular;  Laterality: N/A;   Lower Extremity Arterial Doppler  03/16/2013   bilat ABIs demonstated normal values; R runoff - posterior tibial & anterior tibial arteries occluded; L runoff - peroneal & posterior tibial arteries occluded, anterior tibial artery appears occluded   LUMBAR LAMINECTOMY/DECOMPRESSION MICRODISCECTOMY N/A 09/12/2014   Procedure: LUMBAR DECOMPRESSION L3-L4, L4-L5, MICRODISCECTOMY L4-L5;  Surgeon: [name], MD;  Location: [facility];  Service: Orthopedics;   Laterality: N/A;   PERIPHERAL VASCULAR BALLOON ANGIOPLASTY  11/14/2020   Procedure: PERIPHERAL VASCULAR BALLOON ANGIOPLASTY;  Surgeon: [name], MD;  Location: [place] [facility];  Service: Cardiovascular;;   PERIPHERAL VASCULAR BALLOON ANGIOPLASTY Left 01/02/2021  Procedure: PERIPHERAL VASCULAR BALLOON ANGIOPLASTY;  Surgeon: [name], MD;  Location: [place] [facility];  Service: Cardiovascular;  Laterality: Left;  Failed PTA   Pilonidal Cyst removed     TRANSTHORACIC ECHOCARDIOGRAM  11/10/2012   EF 40-45%, mild LVH, mild hypokinesis of anteroseptal myocardium, grade 1 diastolic dysfunction; mild MR & calcifed mitral annulus; LA mild-mod dilated; RA mildly dilated    FAMHx:  Family History  Problem Relation Age of Onset   Cancer Mother    Lung disease Father        & heart disease   Colon polyps Father    Colon cancer Sister    Sudden death Maternal Grandmother    Anesthesia problems Daughter     SOCHx:   reports that he quit smoking about 10 years ago. His smoking use included cigars. He quit smokeless tobacco use about 9 years ago. He reports current alcohol use of about 1.0 - 2.0 standard drink of alcohol per week. He reports that he does not use drugs.  ALLERGIES:  Allergies  Allergen Reactions   Entresto [Sacubitril-Valsartan] Other (See Comments)    dizziness   Sacubitril Other (See Comments)     Dizziness    ROS: Pertinent items noted in HPI and remainder of comprehensive ROS otherwise negative.  HOME MEDS: Current Outpatient Medications  Medication Sig Dispense Refill   acetaminophen (TYLENOL) 500 MG tablet Take 500-1,000 mg by mouth every 6 (six) hours as needed (for pain.).     aspirin EC 81 MG tablet Take 81 mg by mouth at bedtime. Swallow whole.     carvedilol (COREG) 6.25 MG tablet Take 1 tablet by mouth twice daily 180 tablet 0   ELIQUIS 5 MG TABS tablet Take 1 tablet by mouth twice daily 180 tablet 1   empagliflozin (JARDIANCE) 10 MG  TABS tablet Take 1 tablet (10 mg total) by mouth daily before breakfast. 30 tablet 11   ezetimibe (ZETIA) 10 MG tablet Take 1 tablet (10 mg total) by mouth daily. (Patient taking differently: Take 10 mg by mouth at bedtime.) 90 tablet 3   furosemide (LASIX) 40 MG tablet Take 40 mg by mouth in the morning and at bedtime.     gabapentin (NEURONTIN) 300 MG capsule Take 600 mg by mouth at bedtime.     glipiZIDE (GLUCOTROL XL) 10 MG 24 hr tablet Take 10 mg by mouth at bedtime.     metFORMIN (GLUCOPHAGE-XR) 500 MG 24 hr tablet Take 1,000 mg by mouth 2 (two) times daily.     ONETOUCH VERIO test strip 1 each daily.     polyethylene glycol-electrolytes (NULYTELY) 420 g solution As directed 4000 mL 0   potassium chloride (KLOR-CON) 20 MEQ packet Take 20 mEq by mouth 2 (two) times daily.     No current facility-administered medications for this visit.    LABS/IMAGING: No results found for this or any previous visit (from the past 48 hour(s)).  MR CARDIAC MORPHOLOGY W WO CONTRAST  Result Date: 01/20/2022 CLINICAL DATA:  30M with CAD s/p CABG x3 (LIMa-LAD, SVG-PDA, SVG-PLA). Echo 12/05/21 showed EF 30-35%. Cath 12/15/21 showed patient LIMA-LAD, occluded SVG-PDA and SVG-PLA. Evaluate viability. EXAM: CARDIAC MRI TECHNIQUE: The patient was scanned on a 1.5 Tesla Siemens magnet. A dedicated cardiac coil was used. Functional imaging was done using [employer] sequences. 2,3, and 4 chamber views were done to assess for RWMA's. Modified Simpson's rule using a short axis stack was used to calculate an ejection fraction on a dedicated work  station using [employer]. The patient received 10 cc of Gadavist. After 10 minutes inversion recovery sequences were used to assess for infiltration and scar tissue. CONTRAST:  10 cc  of Gadavist FINDINGS: Left ventricle: -Severe dilatation -Severe systolic dysfunction. Thinning/akinesis of lateral and inferior walls. Lateral wall and apical inferior wall measures <4.15m in thickness,  suggesting nonviability. -Elevated ECV (38%) -No LGE LV EF: 28% (Normal 56-78%) Absolute volumes: LV EDV: 331m(Normal 77-195 mL) LV ESV: 24061mNormal 19-72 mL) LV SV: 81m53mormal 51-133 mL) CO: 6.0L/min (Normal 2.8-8.8 L/min) Indexed volumes: LV EDV: 149mL25mm (Normal 47-92 mL/sq-m) LV ESV: 107mL/54m (Normal 13-30 mL/sq-m) LV SV: 42mL/s54m(Normal 32-62 mL/sq-m) CI: 2.7L/min/sq-m (Normal 1.7-4.2 L/min/sq-m) Right ventricle: Mild dilatation with mild systolic dysfunction RV EF:  40% (Normal 47-74%) Absolute volumes: RV EDV: 239mL (N19ml 88-227 mL) RV ESV: 144mL (No46m 23-103 mL) RV SV: 96mL (Nor93m52-138 mL) CO: 6.1L/min (Normal 2.8-8.8 L/min) Indexed volumes: RV EDV: 107mL/sq-m 70mmal 55-105 mL/sq-m) RV ESV: 64mL/sq-m (27mal 15-43 mL/sq-m) RV SV: 43mL/sq-m (N79ml 32-64 mL/sq-m) CI: 2.7L/min/sq-m (Normal 1.7-4.2 L/min/sq-m) Left atrium: Mild enlargement Right atrium: Mild enlargement Mitral valve: Moderate mitral regurgitation (regurgitant fraction 27%) Aortic valve: No regurgitation Tricuspid valve: Moderate tricuspid regurgitation (regurgitant fraction 25%) Pulmonic valve: No regurgitation Aorta: Normal proximal ascending aorta Pericardium: Normal IMPRESSION: 1. Severe LV dilatation with severe systolic dysfunction (EF 28%). Thinnin53%kinesis of inferior and lateral walls. 2.  Mild RV dilatation with mild systolic dysfunction (EF 40%) 3. While29% late gadolinium enhancement is seen, there is severe thinning (less than 4.5mm) in the a35mal to basal lateral wall and apical inferior wall, suggesting nonviability in these regions. 4.  Moderate mitral regurgitation (regurgitant fraction 27%) 5.  Moderate tricuspid regurgitation (regurgitant fraction 25%) Electronically Signed   By: Christopher  SOswaldo Milian/25/2023 23:31    VITALS: BP 110/60   Pulse 62   Ht '6\' 2"'$  (1.88 m)   Wt 216 lb 12.8 oz (98.3 kg)   SpO2 94%   BMI 27.84 kg/m   EXAM: General appearance: alert and no distress Neck:  JVD - 5 cm above sternal notch, no carotid bruit, and thyroid not enlarged, symmetric, no tenderness/mass/nodules Lungs: dullness to percussion bibasilar Heart: regular rate and rhythm Abdomen: soft, non-tender; bowel sounds normal; no masses,  no organomegaly Extremities: edema trace bilateral sock line Pulses: 2+ and symmetric Skin: Skin color, texture, turgor normal. No rashes or lesions Neurologic: Grossly normal Psych: Pleasant  EKG: Normal sinus rhythm at 66, RBBB-personally reviewed  ASSESSMENT: Acute on chronic systolic congestive heart failure, LVEF 28% by cMRI (12/2021) -inferior and lateral wall thinning suggestive of nonviable myocardium, NYHA Class I-II symptoms Orthostatic hypotension-resolved History of atrial fibrillation, CHADSVASC score of 5, status post DCCV (04/2019) Chron92/4268olic congestive heart failure - EF 40-45%% (2021) Coronary artery disease status post three-vessel CABG in 2013 (LIMA to LAD, SVG to PDA and SVG to PLA) -occluded SVG to PDA and SVG to PLA by cath 11/2021 Hypertension-controlled Dyslipidemia Diabetes type 2 - better controlled Mild PVD-with normal ABI Hypotension/upper back pain - resolved Spinal stenosis - s/p surgery Trifascicular block OSA-back on BiPAP  PLAN: 1.   Mr. Kaczynski has hadClevengerent drop in LVEF now down to 28% by cardiac MRI.  There is mild RV systolic dysfunction.  There is thinning of the inferior and lateral wall suggesting nonviability.  Based on his cath which showed a patent LIMA but occluded SVG to PLA and PDA, there is no recommendation for native  circumflex revascularization.  He currently has NYHA class I-II symptoms and feels markedly improved although does get fatigue with exertion.  His LVEDP at cath was normal.  I feel that he is euvolemic.  He is on guideline directed medical therapy with carvedilol and Jardiance.  He could not tolerate Entresto due to dizziness.  We will add low-dose spironolactone 12.5 mg daily due  to his history of dizziness.  Will reduce potassium supplement to 20 mEq daily.  Repeat BMET in 2 weeks.  Based on his low LVEF less than 35% he is at increased risk of sudden cardiac death per MADIT criteria, will refer to EP for ICD evaluation.  His LVEF is not expected to improve further.  Plan follow-up with me in 3 months.  [name], MD, [facility], [place] Director of the [facility] Lipid [facility] &  Cardiovascular Risk Reduction Clinic Diplomate of the American Board of Clinical Lipidology Attending Cardiologist  Direct Dial: [phone]  Fax: [phone]  Website:  [email or web address] Tiera Mensinger 01/21/2022, 9:07 AM

## 2022-01-23 ENCOUNTER — Ambulatory Visit: Payer: Medicare Other

## 2022-01-23 NOTE — Telephone Encounter (Signed)
Noted  

## 2022-01-26 ENCOUNTER — Other Ambulatory Visit (HOSPITAL_COMMUNITY): Payer: Medicare Other

## 2022-01-28 ENCOUNTER — Encounter (HOSPITAL_COMMUNITY): Payer: Self-pay

## 2022-01-28 ENCOUNTER — Ambulatory Visit (HOSPITAL_COMMUNITY): Admit: 2022-01-28 | Payer: Medicare Other | Admitting: Internal Medicine

## 2022-01-28 SURGERY — COLONOSCOPY WITH PROPOFOL
Anesthesia: Monitor Anesthesia Care

## 2022-02-03 ENCOUNTER — Encounter: Payer: Self-pay | Admitting: Internal Medicine

## 2022-02-06 DIAGNOSIS — E1122 Type 2 diabetes mellitus with diabetic chronic kidney disease: Secondary | ICD-10-CM | POA: Diagnosis not present

## 2022-02-06 DIAGNOSIS — I10 Essential (primary) hypertension: Secondary | ICD-10-CM | POA: Diagnosis not present

## 2022-02-06 DIAGNOSIS — E7849 Other hyperlipidemia: Secondary | ICD-10-CM | POA: Diagnosis not present

## 2022-02-06 DIAGNOSIS — E7801 Familial hypercholesterolemia: Secondary | ICD-10-CM | POA: Diagnosis not present

## 2022-02-06 DIAGNOSIS — E1165 Type 2 diabetes mellitus with hyperglycemia: Secondary | ICD-10-CM | POA: Diagnosis not present

## 2022-02-09 DIAGNOSIS — I5022 Chronic systolic (congestive) heart failure: Secondary | ICD-10-CM | POA: Diagnosis not present

## 2022-02-10 ENCOUNTER — Other Ambulatory Visit: Payer: Self-pay | Admitting: *Deleted

## 2022-02-10 DIAGNOSIS — Z79899 Other long term (current) drug therapy: Secondary | ICD-10-CM

## 2022-02-10 DIAGNOSIS — I5022 Chronic systolic (congestive) heart failure: Secondary | ICD-10-CM

## 2022-02-10 LAB — BASIC METABOLIC PANEL
BUN/Creatinine Ratio: 17 (ref 10–24)
BUN: 25 mg/dL (ref 8–27)
CO2: 21 mmol/L (ref 20–29)
Calcium: 9 mg/dL (ref 8.6–10.2)
Chloride: 101 mmol/L (ref 96–106)
Creatinine, Ser: 1.46 mg/dL — ABNORMAL HIGH (ref 0.76–1.27)
Glucose: 161 mg/dL — ABNORMAL HIGH (ref 70–99)
Potassium: 4.3 mmol/L (ref 3.5–5.2)
Sodium: 141 mmol/L (ref 134–144)
eGFR: 49 mL/min/{1.73_m2} — ABNORMAL LOW (ref >59)

## 2022-02-11 ENCOUNTER — Encounter: Payer: Self-pay | Admitting: Internal Medicine

## 2022-02-11 DIAGNOSIS — I255 Ischemic cardiomyopathy: Secondary | ICD-10-CM | POA: Diagnosis not present

## 2022-02-11 DIAGNOSIS — N401 Enlarged prostate with lower urinary tract symptoms: Secondary | ICD-10-CM | POA: Diagnosis not present

## 2022-02-11 DIAGNOSIS — R194 Change in bowel habit: Secondary | ICD-10-CM | POA: Diagnosis not present

## 2022-02-11 DIAGNOSIS — E1165 Type 2 diabetes mellitus with hyperglycemia: Secondary | ICD-10-CM | POA: Diagnosis not present

## 2022-02-11 DIAGNOSIS — R5383 Other fatigue: Secondary | ICD-10-CM | POA: Diagnosis not present

## 2022-02-11 DIAGNOSIS — I10 Essential (primary) hypertension: Secondary | ICD-10-CM | POA: Diagnosis not present

## 2022-02-11 DIAGNOSIS — E1122 Type 2 diabetes mellitus with diabetic chronic kidney disease: Secondary | ICD-10-CM | POA: Diagnosis not present

## 2022-02-11 DIAGNOSIS — L97509 Non-pressure chronic ulcer of other part of unspecified foot with unspecified severity: Secondary | ICD-10-CM | POA: Diagnosis not present

## 2022-02-11 DIAGNOSIS — D638 Anemia in other chronic diseases classified elsewhere: Secondary | ICD-10-CM | POA: Diagnosis not present

## 2022-02-11 DIAGNOSIS — I5033 Acute on chronic diastolic (congestive) heart failure: Secondary | ICD-10-CM | POA: Diagnosis not present

## 2022-02-11 DIAGNOSIS — E11621 Type 2 diabetes mellitus with foot ulcer: Secondary | ICD-10-CM | POA: Diagnosis not present

## 2022-02-11 DIAGNOSIS — E7849 Other hyperlipidemia: Secondary | ICD-10-CM | POA: Diagnosis not present

## 2022-02-20 ENCOUNTER — Encounter: Payer: Self-pay | Admitting: Internal Medicine

## 2022-02-20 DIAGNOSIS — Z79899 Other long term (current) drug therapy: Secondary | ICD-10-CM

## 2022-02-20 DIAGNOSIS — I5022 Chronic systolic (congestive) heart failure: Secondary | ICD-10-CM

## 2022-02-26 ENCOUNTER — Encounter: Payer: Self-pay | Admitting: Internal Medicine

## 2022-02-27 MED ORDER — CARVEDILOL 6.25 MG PO TABS: 3.1250 mg | ORAL_TABLET | Freq: Two times a day (BID) | ORAL | 0 refills | Status: DC

## 2022-02-27 NOTE — Telephone Encounter (Signed)
He needs a repeat BMET - when the creatinine was up I stopped the spironolactone. Jardiance could be an issue as well as his diuretic, especially if he is not staying hydrated. Would advise decreasing the coreg to 3.125 mg BID starting tonight - continue Jardiance pending repeat BMET early next week.  Dr. Lemmie Evens

## 2022-03-04 DIAGNOSIS — I5022 Chronic systolic (congestive) heart failure: Secondary | ICD-10-CM | POA: Diagnosis not present

## 2022-03-04 DIAGNOSIS — Z79899 Other long term (current) drug therapy: Secondary | ICD-10-CM | POA: Diagnosis not present

## 2022-03-05 LAB — BASIC METABOLIC PANEL
BUN/Creatinine Ratio: 18 (ref 10–24)
BUN: 17 mg/dL (ref 8–27)
CO2: 24 mmol/L (ref 20–29)
Calcium: 9.3 mg/dL (ref 8.6–10.2)
Chloride: 99 mmol/L (ref 96–106)
Creatinine, Ser: 0.92 mg/dL (ref 0.76–1.27)
Glucose: 157 mg/dL — ABNORMAL HIGH (ref 70–99)
Potassium: 3.7 mmol/L (ref 3.5–5.2)
Sodium: 139 mmol/L (ref 134–144)
eGFR: 86 mL/min/{1.73_m2} (ref >59)

## 2022-03-10 ENCOUNTER — Encounter: Payer: Self-pay | Admitting: Cardiology

## 2022-03-10 ENCOUNTER — Ambulatory Visit: Payer: Medicare Other | Attending: Cardiology | Admitting: Cardiology

## 2022-03-10 ENCOUNTER — Encounter: Payer: Self-pay | Admitting: *Deleted

## 2022-03-10 VITALS — BP 120/66 | HR 67 | Ht 74.0 in | Wt 211.2 lb

## 2022-03-10 DIAGNOSIS — I5022 Chronic systolic (congestive) heart failure: Secondary | ICD-10-CM | POA: Insufficient documentation

## 2022-03-10 NOTE — Progress Notes (Signed)
Electrophysiology Office Note   Date:  03/10/2022   ID:  [name], DOB 1945/07/13, MRN [id number]  PCP:  [name] [name], MD  Cardiologist:  Debara Pickett Primary Electrophysiologist:  [name], MD    Chief Complaint: CHF   History of Present Illness: [name] [name] is a 76 y.o. male who is being seen today for the evaluation of CHF at the request of [name], [name], MD. Presenting today for electrophysiology evaluation.  He has a history significant for coronary artery disease status post CABG, chronic systolic heart failure, diabetes, hypertension, sleep apnea.  He had a cardiac MRI that showed thinning and akinesis of the inferior lateral walls, but no scar.  He had a left heart catheterization recently that showed interval occlusion of the SVG to PDA and SVG to PLA.  His LIMA to LAD was patent.  His main symptoms are fatigue and shortness of breath.  He also has had some low blood pressures.  He states that he would be happy to do just about any intervention to feel better.  Today, he denies symptoms of palpitations, chest pain, orthopnea, PND, lower extremity edema, claudication, dizziness, presyncope, syncope, bleeding, or neurologic sequela. The patient is tolerating medications without difficulties.    Past Medical History:  Diagnosis Date   Arthritis    Knee L - probably   Atrial fibrillation (HCC)    BPH (benign prostatic hyperplasia)    CAD (coronary artery disease) 07/06/2011   CHF (congestive heart failure) (HCC)    Coronary artery disease    Diabetes mellitus    Frequency of urination    Heart failure (HCC)    High cholesterol    History of nuclear stress test 09/2010   dipyridamole; mild perfusion defect due to attenuation with mild superimposed ischemia in apical septal, apical, apical inferior & apical lateral regions; rest LV enlarged in size; prominent gut uptake in infero-apical region; no significant ischemia demonstrated; low risk scan    HNP  (herniated nucleus pulposus), lumbar    Hypertension    Ischemic cardiomyopathy    LV dysfunction 07/06/2011   Nocturia    Right bundle branch block    S/P CABG (coronary artery bypass graft) 08/03/2011   x3; LIMA to LAD,, SVG to PDA, SVG to posterolateral branch of RCA; Dr. Ellison Hughs   Sleep apnea    sleep study 10/2010- AHI during total sleep 32.1/hr and during REM 62.3/hr (severe sleep apnea)unable to tolerate c pap   Spinal stenosis of lumbar region    Stroke [facility])    [place]    Past Surgical History:  Procedure Laterality Date   ABDOMINAL AORTOGRAM W/LOWER EXTREMITY Left 11/14/2020   Procedure: ABDOMINAL AORTOGRAM W/LOWER EXTREMITY;  Surgeon: [name], MD;  Location: [name];  Service: Cardiovascular;  Laterality: Left;   ABDOMINAL AORTOGRAM W/LOWER EXTREMITY N/A 01/02/2021   Procedure: ABDOMINAL AORTOGRAM W/LOWER EXTREMITY;  Surgeon: [name], MD;  Location: [name];  Service: Cardiovascular;  Laterality: N/A;   AMPUTATION Left 03/20/2021   Procedure: Left foot 5th ray amputation;  Surgeon: [name], MD;  Location: [name];  Service: Orthopedics;  Laterality: Left;   CARDIAC CATHETERIZATION  01/2011   ischemic cardiomyopathy 30-35%, multivessel CAD (Dr. [name])    CARDIAC CATHETERIZATION  09/13/2015   Procedure: Right/Left Heart Cath and Coronary/Graft Angiography;  Surgeon: [name], MD;  Location: [place] CV LAB;  Service: Cardiovascular;;   CARDIOVERSION N/A 05/16/2019  Procedure: CARDIOVERSION;  Surgeon: [name], MD;  Location[facility] ENDOSCOPY;  Service: Cardiovascular;  Laterality: N/A;   Colonscopy     CORONARY ARTERY BYPASS GRAFT  08/03/2011   Procedure: CORONARY ARTERY BYPASS GRAFTING (CABG);  Surgeon: [name], MD;  Location: [name];  Service: Open Heart Surgery;  Laterality: N/A;  CABG times three using right saphenous vein and left mammary artery usisng endoscope.   LEFT HEART CATH AND CORS/GRAFTS  ANGIOGRAPHY N/A 12/15/2021   Procedure: LEFT HEART CATH AND CORS/GRAFTS ANGIOGRAPHY;  Surgeon: [name], MD;  Location: [place] CV LAB;  Service: Cardiovascular;  Laterality: N/A;   LEFT HEART CATHETERIZATION WITH CORONARY/GRAFT ANGIOGRAM N/A 09/20/2013   Procedure: LEFT HEART CATHETERIZATION WITH [name];  Surgeon: [name], MD;  Location: [facility] CATH LAB;  Service: Cardiovascular;  Laterality: N/A;   Lower Extremity Arterial Doppler  03/16/2013   bilat ABIs demonstated normal values; R runoff - posterior tibial & anterior tibial arteries occluded; L runoff - peroneal & posterior tibial arteries occluded, anterior tibial artery appears occluded   LUMBAR LAMINECTOMY/DECOMPRESSION MICRODISCECTOMY N/A 09/12/2014   Procedure: LUMBAR DECOMPRESSION L3-L4, L4-L5, MICRODISCECTOMY L4-L5;  Surgeon: [name], MD;  Location: [facility];  Service: Orthopedics;  Laterality: N/A;   PERIPHERAL VASCULAR BALLOON ANGIOPLASTY  11/14/2020   Procedure: PERIPHERAL VASCULAR BALLOON ANGIOPLASTY;  Surgeon: [name], MD;  Location: [place] [facility] LAB;  Service: Cardiovascular;;   PERIPHERAL VASCULAR BALLOON ANGIOPLASTY Left 01/02/2021   Procedure: PERIPHERAL VASCULAR BALLOON ANGIOPLASTY;  Surgeon: [name], MD;  Location: [place] [facility] LAB;  Service: Cardiovascular;  Laterality: Left;  Failed PTA   Pilonidal Cyst removed     TRANSTHORACIC ECHOCARDIOGRAM  11/10/2012   EF 40-45%, mild LVH, mild hypokinesis of anteroseptal myocardium, grade 1 diastolic dysfunction; mild MR & calcifed mitral annulus; LA mild-mod dilated; RA mildly dilated     Current Outpatient Medications  Medication Sig Dispense Refill   acetaminophen (TYLENOL) 500 MG tablet Take 500-1,000 mg by mouth every 6 (six) hours as needed (for pain.).     aspirin EC 81 MG tablet Take 81 mg by mouth at bedtime. Swallow whole.     carvedilol (COREG) 6.25 MG tablet Take 0.5 tablets (3.125 mg total) by mouth 2 (two)  times daily. 180 tablet 0   ELIQUIS 5 MG TABS tablet Take 1 tablet by mouth twice daily 180 tablet 1   empagliflozin (JARDIANCE) 10 MG TABS tablet Take 1 tablet (10 mg total) by mouth daily before breakfast. 30 tablet 11   furosemide (LASIX) 40 MG tablet Take 40 mg by mouth in the morning and at bedtime.     gabapentin (NEURONTIN) 300 MG capsule Take 600 mg by mouth at bedtime.     glipiZIDE (GLUCOTROL XL) 10 MG 24 hr tablet Take 10 mg by mouth at bedtime.     metFORMIN (GLUCOPHAGE-XR) 500 MG 24 hr tablet Take 1,000 mg by mouth 2 (two) times daily.     ONETOUCH VERIO test strip 1 each daily.     rosuvastatin (CRESTOR) 20 MG tablet Take by mouth.     polyethylene glycol-electrolytes (NULYTELY) 420 g solution As directed (Patient not taking: Reported on 03/10/2022) 4000 mL 0   No current facility-administered medications for this visit.    Allergies:   Entresto [sacubitril-valsartan] and Sacubitril   Social History:  The patient  reports that he quit smoking about 10 years ago. His smoking use included cigars. He quit smokeless tobacco use about 10 years  ago. He reports current alcohol use of about 1.0 - 2.0 standard drink of alcohol per week. He reports that he does not use drugs.   Family History:  The patient's family history includes Anesthesia problems in his daughter; Cancer in his mother; Colon cancer in his sister; Colon polyps in his father; Lung disease in his father; Sudden death in his maternal grandmother.    ROS:  Please see the history of present illness.   Otherwise, review of systems is positive for none.   All other systems are reviewed and negative.    PHYSICAL EXAM: VS:  BP 120/66   Pulse 67   Ht '6\' 2"'$  (1.88 m)   Wt 211 lb 3.2 oz (95.8 kg)   SpO2 97%   BMI 27.12 kg/m  , BMI Body mass index is 27.12 kg/m. GEN: Well nourished, well developed, in no acute distress  HEENT: normal  Neck: no JVD, carotid bruits, or masses Cardiac: RRR; no murmurs, rubs, or gallops,no  edema  Respiratory:  clear to auscultation bilaterally, normal work of breathing GI: soft, nontender, nondistended, + BS MS: no deformity or atrophy  Skin: warm and dry Neuro:  Strength and sensation are intact Psych: euthymic mood, full affect  EKG:  EKG is not ordered today. Personal review of the ekg ordered 01/06/22 shows sinus rhythm, right bundle branch block, inferior infarct, rate 58  Recent Labs: 11/17/2021: ALT 13 12/08/2021: Hemoglobin 13.0; Platelets 198 12/09/2021: BNP 482.4 03/04/2022: BUN 17; Creatinine, Ser 0.92; Potassium 3.7; Sodium 139    Lipid Panel     Component Value Date/Time   CHOL 98 (L) 12/10/2021 0832   TRIG 88 12/10/2021 0832   HDL 35 (L) 12/10/2021 0832   CHOLHDL 2.8 12/10/2021 0832   LDLCALC 46 12/10/2021 0832     Wt Readings from Last 3 Encounters:  03/10/22 211 lb 3.2 oz (95.8 kg)  01/21/22 216 lb 12.8 oz (98.3 kg)  01/06/22 215 lb (97.5 kg)      Other studies Reviewed: Additional studies/ records that were reviewed today include: LHC 12/15/21  Review of the above records today demonstrates:  1.  Severe native coronary artery disease with patency of the left main, total occlusion of the LAD just after the first diagonal, multiple severe calcific stenoses throughout the entirety of the circumflex distribution, and moderate stenoses in the RCA 2.  Status post CABG with continued patency of the LIMA to LAD, and interval occlusion of the SVG to PDA and SVG to PLA 3.  Normal LVEDP  Cardiac MRI 01/20/2022 1. Severe LV dilatation with severe systolic dysfunction (EF 53%). Thinning/akinesis of inferior and lateral walls.   2.  Mild RV dilatation with mild systolic dysfunction (EF 66%)   3. While no late gadolinium enhancement is seen, there is severe thinning (less than 4.75m) in the apical to basal lateral wall and apical inferior wall, suggesting nonviability in these regions.   4.  Moderate mitral regurgitation (regurgitant fraction 27%)   5.   Moderate tricuspid regurgitation (regurgitant fraction 25%)  ASSESSMENT AND PLAN:  1.  Chronic systolic heart failure: Due to ischemic cardiomyopathy.  Currently on carvedilol 6.25 mg twice daily, Jardiance 10 mg daily.  He did not tolerate Entresto due to dizziness.  He would likely benefit from ICD therapy due to his persistently low ejection fraction and nonviability on cardiac MRI.  He does have a right bundle branch block and a long first-degree AV block.  Due to that, he would likely benefit from  CRT pacing.  Risk and benefits were discussed with bleeding, tamponade, infection, pneumothorax, lead dislodgment.  He understands these risks and is agreed to the procedure.  2.  Coronary artery disease: Status post CABG.  Has had a recent catheterization with 2 occluded grafts.  We Cleda Imel continue management per primary cardiology.  3.  Hypertension: Currently well controlled  4.  Hyperlipidemia: Continue per primary cardiology    Current medicines are reviewed at length with the patient today.   The patient does not have concerns regarding his medicines.  The following changes were made today:  none  Labs/ tests ordered today include:  No orders of the defined types were placed in this encounter.    Disposition:   FU with Bernestine Holsapple 3 months  Signed, Makhari Dovidio Meredith Leeds, MD  03/10/2022 12:43 PM     [place] [address] [place] [address] [phone] (office) [phone] (fax)

## 2022-03-10 NOTE — Patient Instructions (Addendum)
Medication Instructions:  Your physician recommends that you continue on your current medications as directed. Please refer to the Current Medication list given to you today.  *If you need a refill on your cardiac medications before your next appointment, please call your pharmacy*   Lab Work: Pre procedure lab work -- see procedure instruction letter  If you have labs (blood work) drawn today and your tests are completely normal, you will receive your results only by: [place] (if you have MyChart) OR A paper copy in the mail If you have any lab test that is abnormal or we need to change your treatment, we will call you to review the results.   Testing/Procedures: Your physician has recommended that you have a defibrillator inserted. An implantable cardioverter defibrillator (ICD) is a small device that is placed in your chest or, in rare cases, your abdomen. This device uses electrical pulses or shocks to help control life-threatening, irregular heartbeats that could lead the heart to suddenly stop beating (sudden cardiac arrest). Leads are attached to the ICD that goes into your heart. This is done in the hospital and usually requires an overnight stay. Please see the instruction sheet given to you today for more information.   Follow-Up: At [facility], you and your health needs are our priority.  As part of our continuing mission to provide you with exceptional heart care, we have created designated Provider Care Teams.  These Care Teams include your primary Cardiologist (physician) and Advanced Practice Providers (APPs -  Physician Assistants and Nurse Practitioners) who all work together to provide you with the care you need, when you need it.  Your next appointment:   2 week(s) after your ICD implant  The format for your next appointment:   In Person  Provider:   Device   clinic for a wound check    Thank you for choosing [facility]!!   Trinidad Curet,  RN [phone]    Other Instructions   Important Information About [place]          Cardioverter Defibrillator Implantation An implantable cardioverter defibrillator (ICD) is a small, lightweight, battery-powered device that is placed (implanted) under the skin in the chest or abdomen. Your caregiver may prescribe an ICD if: You have had an irregular heart rhythm (arrhythmia) that originated in the lower chambers of the heart (ventricles). Your heart has been damaged by a disease (such as coronary artery disease) or heart condition (such as a heart attack). An ICD consists of a battery that lasts several years, a small computer called a pulse generator, and wires called leads that go into the heart. It is used to detect and correct two dangerous arrhythmias: a rapid heart rhythm (tachycardia) and an arrhythmia in which the ventricles contract in an uncoordinated way (fibrillation). When an ICD detects tachycardia, it sends an electrical signal to the heart that restores the heartbeat to normal (cardioversion). This signal is usually painless. If cardioversion does not work or if the ICD detects fibrillation, it delivers a small electrical shock to the heart (defibrillation) to restart the heart. The shock may feel like a strong jolt in the chest. ICDs may be programmed to correct other problems. Sometimes, ICDs are programmed to act as another type of implantable device called a pacemaker. Pacemakers are used to treat a slow heartbeat (bradycardia). LET YOUR CAREGIVER KNOW ABOUT: Any allergies you have. All medicines you are taking, including vitamins, herbs, eyedrops, and over-the-counter medicines and creams. Previous problems you  or members of your family have had with the use of anesthetics. Any blood disorders you have had. Other health problems you have. RISKS AND COMPLICATIONS Generally, the procedure to implant an ICD is safe. However, as with any surgical procedure,  complications can occur. Possible complications associated with implanting an ICD include: Swelling, bleeding, or bruising at the site where the ICD was implanted. Infection at the site where the ICD was implanted. A reaction to medicine used during the procedure. Nerve, heart, or blood vessel damage. Blood clots. BEFORE THE PROCEDURE You may need to have blood tests, heart tests, or a chest X-ray done before the day of the procedure. Ask your caregiver about changing or stopping your regular medicines. Make plans to have someone drive you home. You may need to stay in the hospital overnight after the procedure. Stop smoking at least 24 hours before the procedure. Take a bath or shower the night before the procedure. You may need to scrub your chest or abdomen with a special type of soap. Do not eat or drink before your procedure for as long as directed by your caregiver. Ask if it is okay to take any needed medicine with a small sip of water. PROCEDURE  The procedure to implant an ICD in your chest or abdomen is usually done at a hospital in a room that has a large X-ray machine called a fluoroscope. The machine will be above you during the procedure. It will help your caregiver see your heart during the procedure. Implanting an ICD usually takes 1-3 hours. Before the procedure:  Small monitors will be put on your body. They will be used to check your heart, blood pressure, and oxygen level. A needle will be put into a vein in your hand or arm. This is called an intravenous (IV) access tube. Fluids and medicine will flow directly into your body through the IV tube. Your chest or abdomen will be cleaned with a germ-killing (antiseptic) solution. The area may be shaved. You may be given medicine to help you relax (sedative). You will be given a medicine called a local anesthetic. This medicine will make the surgical site numb while the ICD is implanted. You will be sleepy but awake during the  procedure. After you are numb the procedure will begin. The caregiver will: Make a small cut (incision). This will make a pocket deep under your skin that will hold the pulse generator. Guide the leads through a large blood vessel into your heart and attach them to the heart muscles. Depending on the ICD, the leads may go into one ventricle or they may go to both ventricles and into an upper chamber of the heart (atrium). Test the ICD. Close the incision with stitches, glue, or staples. AFTER THE PROCEDURE You may feel pain. Some pain is normal. It may last a few days. You may stay in a recovery area until the local anesthetic has worn off. Your blood pressure and pulse will be checked often. You will be taken to a room where your heart will be monitored. A chest X-ray will be taken. This is done to check that the cardioverter defibrillator is in the right place. You may stay in the hospital overnight. A slight bump may be seen over the skin where the ICD was placed. Sometimes, it is possible to feel the ICD under the skin. This is normal. In the months and years afterward, your caregiver will check the device, the leads, and the battery every few  months. Eventually, when the battery is low, the ICD will be replaced.   This information is not intended to replace advice given to you by your health care provider. Make sure you discuss any questions you have with your health care provider.   Document Released: 03/07/2002 Document Revised: 04/05/2013 Document Reviewed: 07/04/2012 Elsevier Interactive Patient Education 2016 [place] Defibrillator Implantation, Care After This sheet gives you information about how to care for yourself after your procedure. Your health care provider may also give you more specific instructions. If you have problems or questions, contact your health care provider. What can I expect after the procedure? After the procedure, it is common to  have: Some pain. It may last a few days. A slight bump over the skin where the device was placed. Sometimes, it is possible to feel the device under the skin. This is normal.  During the months and years after your procedure, your health care provider will check the device, the leads, and the battery every few months. Eventually, when the battery is low, the device will be replaced. Follow these instructions at home: Medicines Take over-the-counter and prescription medicines only as told by your health care provider. If you were prescribed an antibiotic medicine, take it as told by your health care provider. Do not stop taking the antibiotic even if you start to feel better. Incision care  Follow instructions from your health care provider about how to take care of your incision area. Make sure you: Wash your hands with soap and water before you change your bandage (dressing). If soap and water are not available, use hand sanitizer. Change your dressing as told by your health care provider. Leave stitches (sutures), skin glue, or adhesive strips in place. These skin closures may need to stay in place for 2 weeks or longer. If adhesive strip edges start to loosen and curl up, you may trim the loose edges. Do not remove adhesive strips completely unless your health care provider tells you to do that. Check your incision area every day for signs of infection. Check for: More redness, swelling, or pain. More fluid or blood. Warmth. Pus or a bad smell. Do not use lotions or ointments near the incision area unless told by your health care provider. Keep the incision area clean and dry for 2-3 days after the procedure or for as long as told by your health care provider. It takes several weeks for the incision site to heal completely. Do not take baths, swim, or use a hot tub until your health care provider approves. Activity Try to walk a little every day. Exercising is important after this  procedure. Also, use your shoulder on the side of the defibrillator in daily tasks that do not require a lot of motion. For at least 6 weeks: Do not lift your upper arm above your shoulders. This means no tennis, golf, or swimming for this period of time. If you tend to sleep with your arm above your head, use a restraint to prevent this during sleep. Avoid sudden jerking, pulling, or chopping movements that pull your upper arm far away from your body. Ask your health care provider when you may go back to work. Check with your health care provider before you start to drive or play sports. Electric and magnetic fields Tell all health care providers that you have a defibrillator. This may prevent them from giving you an MRI scan because strong magnets are used for that test.  If you must pass through a metal detector, quickly walk through it. Do not stop under the detector, and do not stand near it. Avoid places or objects that have a strong electric or magnetic field, including: Engineer, maintenance. At the airport, let officials know that you have a defibrillator. Your defibrillator ID card will let you be checked in a way that is safe for you and will not damage your defibrillator. Also, do not let a security person wave a magnetic wand near your defibrillator. That can make it stop working. Power plants. Large electrical generators. Anti-theft systems or electronic article surveillance (EAS). Radiofrequency transmission towers, such as cell phone and radio towers. Do not use amateur (ham) radio equipment or electric (arc) welding torches. Some devices are safe to use if held at least 12 inches (30 cm) from your defibrillator. These include power tools, lawn mowers, and speakers. If you are unsure if something is safe to use, ask your health care provider. Do not use MP3 player headphones. They have magnets. You may safely use electric blankets, heating pads, computers, and microwave ovens. When  using your cell phone, hold it to the ear that is on the opposite side from the defibrillator. Do not leave your cell phone in a pocket over the defibrillator. General instructions Follow diet instructions from your health care provider, if this applies. Always keep your defibrillator ID card with you. The card should list the implant date, device model, and manufacturer. Consider wearing a medical alert bracelet or necklace. Have your defibrillator checked every 3-6 months or as often as told by your health care provider. Most defibrillators last for 4-8 years. Keep all follow-up visits as told by your health care provider. This is important for your health care provider to make sure your chest is healing the way it should. Ask your health care provider when you should come back to have your stitches or staples taken out. Contact a health care provider if: You feel one shock in your chest. You gain weight suddenly. Your legs or feet swell more than they have before. It feels like your heart is fluttering or skipping beats (heart palpitations). You have more redness, swelling, or pain around your incision. You have more fluid or blood coming from your incision. Your incision feels warm to the touch. You have pus or a bad smell coming from your incision. You have a fever. Get help right away if: You have chest pain. You feel more than one shock. You feel more short of breath than you have felt before. You feel more light-headed than you have felt before. Your incision starts to open up. This information is not intended to replace advice given to you by your health care provider. Make sure you discuss any questions you have with your health care provider. Document Released: 01/02/2005 Document Revised: 01/03/2016 Document Reviewed: 11/20/2015 Elsevier Interactive Patient Education  2018 [place] Discharge Instructions for  Pacemaker/Defibrillator  Patients  ACTIVITY No heavy lifting or vigorous activity with your left/right arm for 6 to 8 weeks.  Do not raise your left/right arm above your head for one week.  Gradually raise your affected arm as drawn below.           __  NO DRIVING for     ; you may begin driving on     .  WOUND CARE Keep the wound area clean and dry.  Do not get this area wet for one  week. No showers for one week; you may shower on     . The tape/steri-strips on your wound will fall off; do not pull them off.  No bandage is needed on the site.  DO  NOT apply any creams, oils, or ointments to the wound area. If you notice any drainage or discharge from the wound, any swelling or bruising at the site, or you develop a fever > 101? F after you are discharged home, call the office at once.  SPECIAL INSTRUCTIONS You are still able to use cellular telephones; use the ear opposite the side where you have your pacemaker/defibrillator.  Avoid carrying your cellular phone near your device. When traveling through airports, show security personnel your identification card to avoid being screened in the metal detectors.  Ask the security personnel to use the hand wand. Avoid arc welding equipment, MRI testing (magnetic resonance imaging), TENS units (transcutaneous nerve stimulators).  Call the office for questions about other devices. Avoid electrical appliances that are in poor condition or are not properly grounded. Microwave ovens are safe to be near or to operate.  ADDITIONAL INFORMATION FOR DEFIBRILLATOR PATIENTS SHOULD YOUR DEVICE GO OFF: If your device goes off ONCE and you feel fine afterward, notify the device clinic nurses. If your device goes off ONCE and you do not feel well afterward, call 911. If your device goes off TWICE, call 911. If your device goes off THREE TIMES IN ONE DAY, call 911.  DO NOT DRIVE YOURSELF OR A FAMILY MEMBER WITH A DEFIBRILLATOR TO THE HOSPITAL--CALL 911.

## 2022-03-17 ENCOUNTER — Encounter: Payer: Self-pay | Admitting: Cardiology

## 2022-03-25 NOTE — Telephone Encounter (Signed)
Recall placed

## 2022-04-11 ENCOUNTER — Other Ambulatory Visit: Payer: Self-pay | Admitting: Internal Medicine

## 2022-04-13 DIAGNOSIS — D485 Neoplasm of uncertain behavior of skin: Secondary | ICD-10-CM | POA: Diagnosis not present

## 2022-04-13 DIAGNOSIS — L570 Actinic keratosis: Secondary | ICD-10-CM | POA: Diagnosis not present

## 2022-04-13 DIAGNOSIS — D225 Melanocytic nevi of trunk: Secondary | ICD-10-CM | POA: Diagnosis not present

## 2022-04-13 DIAGNOSIS — C44519 Basal cell carcinoma of skin of other part of trunk: Secondary | ICD-10-CM | POA: Diagnosis not present

## 2022-04-13 DIAGNOSIS — Z85828 Personal history of other malignant neoplasm of skin: Secondary | ICD-10-CM | POA: Diagnosis not present

## 2022-04-13 DIAGNOSIS — Z08 Encounter for follow-up examination after completed treatment for malignant neoplasm: Secondary | ICD-10-CM | POA: Diagnosis not present

## 2022-04-13 DIAGNOSIS — X32XXXD Exposure to sunlight, subsequent encounter: Secondary | ICD-10-CM | POA: Diagnosis not present

## 2022-04-14 ENCOUNTER — Encounter: Payer: Self-pay | Admitting: Cardiology

## 2022-04-16 ENCOUNTER — Other Ambulatory Visit: Payer: Self-pay | Admitting: *Deleted

## 2022-04-16 DIAGNOSIS — Z01812 Encounter for preprocedural laboratory examination: Secondary | ICD-10-CM

## 2022-04-16 DIAGNOSIS — I480 Paroxysmal atrial fibrillation: Secondary | ICD-10-CM

## 2022-04-23 DIAGNOSIS — H02834 Dermatochalasis of left upper eyelid: Secondary | ICD-10-CM | POA: Diagnosis not present

## 2022-04-23 DIAGNOSIS — H04123 Dry eye syndrome of bilateral lacrimal glands: Secondary | ICD-10-CM | POA: Diagnosis not present

## 2022-04-23 DIAGNOSIS — H02831 Dermatochalasis of right upper eyelid: Secondary | ICD-10-CM | POA: Diagnosis not present

## 2022-04-23 DIAGNOSIS — H02832 Dermatochalasis of right lower eyelid: Secondary | ICD-10-CM | POA: Diagnosis not present

## 2022-04-23 DIAGNOSIS — H02835 Dermatochalasis of left lower eyelid: Secondary | ICD-10-CM | POA: Diagnosis not present

## 2022-04-23 DIAGNOSIS — E113293 Type 2 diabetes mellitus with mild nonproliferative diabetic retinopathy without macular edema, bilateral: Secondary | ICD-10-CM | POA: Diagnosis not present

## 2022-04-27 ENCOUNTER — Ambulatory Visit: Payer: Medicare Other | Attending: Internal Medicine | Admitting: General Practice

## 2022-04-27 ENCOUNTER — Encounter: Payer: Self-pay | Admitting: General Practice

## 2022-04-27 ENCOUNTER — Ambulatory Visit: Payer: Medicare Other | Admitting: Internal Medicine

## 2022-04-27 VITALS — BP 112/54 | HR 68 | Ht 74.0 in | Wt 213.0 lb

## 2022-04-27 DIAGNOSIS — E785 Hyperlipidemia, unspecified: Secondary | ICD-10-CM | POA: Diagnosis not present

## 2022-04-27 DIAGNOSIS — I480 Paroxysmal atrial fibrillation: Secondary | ICD-10-CM | POA: Insufficient documentation

## 2022-04-27 DIAGNOSIS — I2510 Atherosclerotic heart disease of native coronary artery without angina pectoris: Secondary | ICD-10-CM | POA: Insufficient documentation

## 2022-04-27 DIAGNOSIS — I10 Essential (primary) hypertension: Secondary | ICD-10-CM | POA: Insufficient documentation

## 2022-04-27 DIAGNOSIS — I5022 Chronic systolic (congestive) heart failure: Secondary | ICD-10-CM | POA: Insufficient documentation

## 2022-04-27 DIAGNOSIS — I255 Ischemic cardiomyopathy: Secondary | ICD-10-CM

## 2022-04-27 NOTE — Patient Instructions (Signed)
Medication Instructions:  The current medical regimen is effective;  continue present plan and medications as directed. Please refer to the Current Medication list given to you today.   If you need a refill on your cardiac medications before your next appointment, please call your pharmacy*  Lab Work: [place] If you have labs (blood work) drawn today and your tests are completely normal, you will receive your results only by:  [place] (if you have MyChart) OR A paper copy in the mail  If you have any lab test that is abnormal or we need to change your treatment, we will call you to review the results.  Testing/Procedures: NONE  Follow-Up: At [facility], you and your health needs are our priority.  As part of our continuing mission to provide you with exceptional heart care, we have created designated Provider Care Teams.  These Care Teams include your primary Cardiologist (physician) and Advanced Practice Providers (APPs -  Physician Assistants and Nurse Practitioners) who all work together to provide you with the care you need, when you need it.  Your next appointment:   4 month(s)  The format for your next appointment:   In Person  Provider:   [name], MD    Important Information About [place]

## 2022-04-27 NOTE — Progress Notes (Signed)
Cardiology Clinic Note   Patient Name: [name] [name] Date of Encounter: 04/27/2022  Primary Care Provider:  [name] [name], MD Primary Cardiologist:  [name], MD  Patient Profile    [name] [name] 76 year old male presents the clinic today for follow-up evaluation of his chronic systolic CHF.  Past Medical History    Past Medical History:  Diagnosis Date   Arthritis    Knee L - probably   Atrial fibrillation (HCC)    BPH (benign prostatic hyperplasia)    CAD (coronary artery disease) 07/06/2011   CHF (congestive heart failure) (HCC)    Coronary artery disease    Diabetes mellitus    Frequency of urination    Heart failure (HCC)    High cholesterol    History of nuclear stress test 09/2010   dipyridamole; mild perfusion defect due to attenuation with mild superimposed ischemia in apical septal, apical, apical inferior & apical lateral regions; rest LV enlarged in size; prominent gut uptake in infero-apical region; no significant ischemia demonstrated; low risk scan    HNP (herniated nucleus pulposus), lumbar    Hypertension    Ischemic cardiomyopathy    LV dysfunction 07/06/2011   Nocturia    Right bundle branch block    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 Ellison Hughs   Sleep apnea    sleep study 10/2010- AHI during total sleep 32.1/hr and during REM 62.3/hr (severe sleep apnea)unable to tolerate c pap   Spinal stenosis of lumbar region    Stroke [facility])    [place]    Past Surgical History:  Procedure Laterality Date   ABDOMINAL AORTOGRAM W/LOWER EXTREMITY Left 11/14/2020   Procedure: ABDOMINAL AORTOGRAM W/LOWER EXTREMITY;  Surgeon: [name], MD;  Location: [place] [facility];  Service: Cardiovascular;  Laterality: Left;   ABDOMINAL AORTOGRAM W/LOWER EXTREMITY N/A 01/02/2021   Procedure: ABDOMINAL AORTOGRAM W/LOWER EXTREMITY;  Surgeon: [name], MD;   Location: [place] [facility];  Service: Cardiovascular;  Laterality: N/A;   AMPUTATION Left 03/20/2021   Procedure: Left foot 5th ray amputation;  Surgeon: [name], MD;  Location: [place];  Service: Orthopedics;  Laterality: Left;   CARDIAC CATHETERIZATION  01/2011   ischemic cardiomyopathy 30-35%, multivessel CAD (Dr. [name])    CARDIAC CATHETERIZATION  09/13/2015   Procedure: Right/Left Heart Cath and Coronary/Graft Angiography;  Surgeon: [name], MD;  Location: [place] [facility];  Service: Cardiovascular;;   CARDIOVERSION N/A 05/16/2019   Procedure: CARDIOVERSION;  Surgeon: [name], MD;  Location: [place];  Service: Cardiovascular;  Laterality: N/A;   Colonscopy     CORONARY ARTERY BYPASS GRAFT  08/03/2011   Procedure: CORONARY ARTERY BYPASS GRAFTING (CABG);  Surgeon: [name], MD;  Location: [place];  Service: Open Heart Surgery;  Laterality: N/A;  CABG times three using right saphenous vein and left mammary artery usisng endoscope.   LEFT HEART CATH AND CORS/GRAFTS ANGIOGRAPHY N/A 12/15/2021   Procedure: LEFT HEART CATH AND CORS/GRAFTS ANGIOGRAPHY;  Surgeon: [name], MD;  Location: [place] [facility];  Service: Cardiovascular;  Laterality: N/A;   LEFT HEART CATHETERIZATION WITH CORONARY/GRAFT ANGIOGRAM N/A 09/20/2013   Procedure: LEFT HEART CATHETERIZATION WITH [name];  Surgeon: [name], MD;  Location: [facility];  Service: Cardiovascular;  Laterality: N/A;   Lower Extremity Arterial Doppler  03/16/2013   bilat ABIs  demonstated normal values; R runoff - posterior tibial & anterior tibial arteries occluded; L runoff - peroneal & posterior tibial arteries occluded, anterior tibial artery appears occluded   LUMBAR LAMINECTOMY/DECOMPRESSION MICRODISCECTOMY N/A 09/12/2014   Procedure: LUMBAR DECOMPRESSION L3-L4, L4-L5, MICRODISCECTOMY L4-L5;  Surgeon: [name], MD;  Location: [facility];  Service: Orthopedics;  Laterality: N/A;    PERIPHERAL VASCULAR BALLOON ANGIOPLASTY  11/14/2020   Procedure: PERIPHERAL VASCULAR BALLOON ANGIOPLASTY;  Surgeon: [name], MD;  Location: [place] [facility];  Service: Cardiovascular;;   PERIPHERAL VASCULAR BALLOON ANGIOPLASTY Left 01/02/2021   Procedure: PERIPHERAL VASCULAR BALLOON ANGIOPLASTY;  Surgeon: [name], MD;  Location: [place] [facility];  Service: Cardiovascular;  Laterality: Left;  Failed PTA   Pilonidal Cyst removed     TRANSTHORACIC ECHOCARDIOGRAM  11/10/2012   EF 40-45%, mild LVH, mild hypokinesis of anteroseptal myocardium, grade 1 diastolic dysfunction; mild MR & calcifed mitral annulus; LA mild-mod dilated; RA mildly dilated    Allergies  Allergies  Allergen Reactions   Entresto [Sacubitril-Valsartan] Other (See Comments)    dizziness   Sacubitril Other (See Comments)     Dizziness    History of Present Illness    [name] [name] is a PMH of chronic systolic CHF, coronary artery disease status post CABG, diabetes, HTN, HLD, and OSA.  He underwent cardiac MRI which showed thinning and akinesis of his inferior lateral walls but no scarring.  His LHC showed occlusion of his SVG-PDA, SVG-PLA, and patent LIMA-LAD.  His echocardiogram 6/23 showed an LVEF of 30-35%, moderate LVH along septal segment, intermediate diastolic parameters, mildly dilated left and right atria, mild-moderate tricuspid regurgitation and no other significant valvular abnormalities.  He was seen in follow-up by Dr. Curt Bears on 03/10/2022.  He reported fatigue and shortness of breath.  He was also noted to have some hypotension.  His cardiac MRI 7/23 was reviewed which showed EF of 28% thinning/akinesis of the inferior and lateral walls, mild RV dilation, moderate mitral regurgitation, moderate tricuspid regurgitation.  Not tolerating Delene Loll was discussed.  Benefits of ICD therapy due to his low ejection fraction were reviewed.  Benefits of CRT pacing were also discussed.  Risks of ICD  implantation were reviewed.  He agreed to proceed with implantation.  He presents to the clinic today for follow-up evaluation states he feels fairly well today.  We reviewed his recent visit with EP.  He presents with his wife.  They have several questions pertaining to ICD implant and CRT.  We reviewed the procedure and expected outcomes.  They expressed understanding.  He hopes to have more energy and endurance.  His wife notes that several months ago colonoscopy was recommended.  He does not report bleeding issues.  I recommended postponing colonoscopy at this time.  He reports compliance with his medication and denies side effects.  He does question knee aching and wonders if it is related to his rosuvastatin.  We reviewed that he has been taking rosuvastatin for quite some time and has needed discomfort is more recent.  I have low suspicion for statin intolerance.  I will order CBC BMP and plan follow-up in 4 months.  Today he denies chest pain, shortness of breath, lower extremity edema, fatigue, palpitations, melena, hematuria, hemoptysis, diaphoresis, weakness, presyncope, syncope, orthopnea, and PND.    Home Medications    Prior to Admission medications   Medication Sig Start Date End Date Taking? Authorizing Provider  acetaminophen (TYLENOL) 500 MG tablet Take 500-1,000 mg by mouth every  6 (six) hours as needed (for pain.).    [provider]  aspirin EC 81 MG tablet Take 81 mg by mouth at bedtime. Swallow whole.    [provider]  carvedilol (COREG) 6.25 MG tablet Take 1 tablet by mouth twice daily 04/13/22   [name], [name], MD  ELIQUIS 5 MG TABS tablet Take 1 tablet by mouth twice daily 03/20/20   [name], [name], MD  empagliflozin (JARDIANCE) 10 MG TABS tablet Take 1 tablet (10 mg total) by mouth daily before breakfast. 09/20/20   [name], [name], MD  furosemide (LASIX) 40 MG tablet Take 40 mg by mouth in the morning and at bedtime.    [provider]   gabapentin (NEURONTIN) 300 MG capsule Take 600 mg by mouth at bedtime.    [provider]  glipiZIDE (GLUCOTROL XL) 10 MG 24 hr tablet Take 10 mg by mouth at bedtime.    [provider]  metFORMIN (GLUCOPHAGE-XR) 500 MG 24 hr tablet Take 1,000 mg by mouth 2 (two) times daily. 07/05/15   [provider]  [facility] VERIO test strip 1 each daily. 08/16/19   [provider]  polyethylene glycol-electrolytes (NULYTELY) 420 g solution As directed Patient not taking: Reported on 03/10/2022 12/25/21   Daneil Dolin, MD  rosuvastatin (CRESTOR) 20 MG tablet Take by mouth. 03/09/22   [provider]    Family History    Family History  Problem Relation Age of Onset   Cancer Mother    Lung disease Father        & heart disease   [name] polyps Father    [name] cancer Sister    Sudden death Maternal Grandmother    Anesthesia problems Daughter    He indicated that his mother is deceased. He indicated that his father is deceased. He indicated that the status of his sister is unknown. He indicated that his maternal grandmother is deceased. He indicated that the status of his daughter is unknown.  Social History    Social History   Socioeconomic History   Marital status: Widowed    Spouse name: Not on file   Number of children: 1   Years of education: Not on file   Highest education level: Not on file  Occupational History   Occupation: real Investment banker, corporate  Tobacco Use   Smoking status: Former    Types: Cigars    Quit date: 09/07/2011    Years since quitting: 10.6   Smokeless tobacco: Former    Quit date: 02/28/2012  Substance and Sexual Activity   Alcohol use: Yes    Alcohol/week: 1.0 - 2.0 standard drink of alcohol    Types: 1 - 2 Cans of beer per week   Drug use: No   Sexual activity: Not on file  Other Topics Concern   Not on file  Social History Narrative   ** Merged History Encounter **       Social Determinants of Health   Financial  Resource Strain: Not on file  Food Insecurity: Not on file  Transportation Needs: Not on file  Physical Activity: Not on file  Stress: Not on file  Social Connections: Not on file  Intimate Partner Violence: Not on file     Review of Systems    General:  No chills, fever, night sweats or weight changes.  Cardiovascular:  No chest pain, dyspnea on exertion, edema, orthopnea, palpitations, paroxysmal nocturnal dyspnea. Dermatological: No rash, lesions/masses Respiratory: No cough, dyspnea Urologic:  No hematuria, dysuria Abdominal:   No nausea, vomiting, diarrhea, bright red blood per rectum, melena, or hematemesis Neurologic:  No visual changes, wkns, changes in mental status. All other systems reviewed and are otherwise negative except as noted above.  Physical Exam    VS:  BP (!) 112/54 (BP Location: Right Arm, Patient Position: Sitting, Cuff Size: Normal)   Pulse 68   Ht '6\' 2"'$  (1.88 m)   Wt 213 lb (96.6 kg)   BMI 27.35 kg/m  , BMI Body mass index is 27.35 kg/m. GEN: Well nourished, well developed, in no acute distress. HEENT: normal. Neck: Supple, no JVD, carotid bruits, or masses. Cardiac: RRR, no murmurs, rubs, or gallops. No clubbing, cyanosis, edema.  Radials/DP/PT 2+ and equal bilaterally.  Respiratory:  Respirations regular and unlabored, clear to auscultation bilaterally. GI: Soft, nontender, nondistended, BS + x 4. MS: no deformity or atrophy. Skin: warm and dry, no rash. Neuro:  Strength and sensation are intact. Psych: Normal affect.  Accessory Clinical Findings    Recent Labs: 11/17/2021: ALT 13 12/08/2021: Hemoglobin 13.0; Platelets 198 12/09/2021: BNP 482.4 03/04/2022: BUN 17; Creatinine, Ser 0.92; Potassium 3.7; Sodium 139   Recent Lipid Panel    Component Value Date/Time   CHOL 98 (L) 12/10/2021 0832   TRIG 88 12/10/2021 0832   HDL 35 (L) 12/10/2021 0832   CHOLHDL 2.8 12/10/2021 0832   LDLCALC 46 12/10/2021 0832         ECG personally reviewed  by me today-none today.  Echocardiogram 12/05/2021 IMPRESSIONS     1. Left ventricular ejection fraction, by estimation, is 30 to 35%. The  left ventricle has moderately decreased function. The left ventricle  demonstrates regional wall motion abnormalities (see scoring  diagram/findings for description). The left  ventricular internal cavity size was mildly to moderately dilated. There  is moderate asymmetric left ventricular hypertrophy of the septal segment.  Left ventricular diastolic parameters are indeterminate.   2. Right ventricular systolic function is normal. The right ventricular  size is normal.   3. Left atrial size was mildly dilated.   4. Right atrial size was mildly dilated.   5. The mitral valve is normal in structure. Severe mitral valve  regurgitation. No evidence of mitral stenosis.   6. Tricuspid valve regurgitation is mild to moderate.   7. The aortic valve is normal in structure. Aortic valve regurgitation is  not visualized. No aortic stenosis is present.   Conclusion(s)/Recommendation(s): No left ventricular mural or apical  thrombus/thrombi.   Cardiac catheterization 12/15/21 1.  Severe native coronary artery disease with patency of the left main, total occlusion of the LAD just after the first diagonal, multiple severe calcific stenoses throughout the entirety of the circumflex distribution, and moderate stenoses in the RCA 2.  Status post CABG with continued patency of the LIMA to LAD, and interval occlusion of the SVG to PDA and SVG to PLA 3.  Normal LVEDP   Heart team review, consider medical therapy versus complex atherectomy and intervention of the left circumflex which would involve stenting back into the left main and covering a long segment throughout the circumflex in order to treat multiple severe calcific lesions.  Note that the patient had akinesis of the inferior and posterior walls on his echo and hypokinesis of the anterior wall segments.  He does  not have angina.  Might be appropriate to perform a viability study.  Will discuss his case with Dr. Debara Pickett.   Diagnostic Dominance: Right Left Main  [name] [name]  LM to LM lesion is 30% stenosed. The lesion is discrete. The lesion is moderately calcified.    Left Anterior Descending  Vessel is small.  Ost LAD to Prox LAD lesion is 50% stenosed.  Mid LAD lesion is 100% stenosed. The lesion is severely calcified.    Left Circumflex  Vessel is small. The entire circumflex vessel is severely calcified. There is high-grade 95% stenosis at the ostium, severe eccentric 95% stenosis in the proximal vessel, severe 90% stenosis in the mid vessel, and severe 90% stenosis in the distal vessel just before it branches into 2 OM's  Ost Cx lesion is 95% stenosed. The lesion is calcified.  Prox Cx to Mid Cx lesion is 90% stenosed. The lesion is eccentric. The lesion is severely calcified.  Mid Cx to Dist Cx lesion is 90% stenosed. The lesion is discrete.    Right Coronary Artery  Prox RCA lesion is 60% stenosed. The lesion is type C and discrete. The lesion is severely calcified.  Dist RCA lesion is 50% stenosed.    Right Posterior Descending Artery  Vessel is small in size.    Right Posterior Atrioventricular Artery  Vessel is small in size.  RPAV lesion is 100% stenosed.    Graft To RPDA  And is moderate in size. The graft exhibits minimal luminal irregularities.  Origin to Prox Graft lesion is 100% stenosed.    Graft To RPAV  And is small. The graft exhibits no disease.  Origin to Prox Graft lesion is 100% stenosed.    LIMA LIMA Graft To Dist LAD  LIMA and is moderate in size. The graft exhibits no disease. There is competitive flow. The LIMA to LAD graft is widely patent with no stenosis. This fills the entirety of the LAD as it is occluded just beyond the first diagonal branch    Intervention   No interventions have been documented.    Diagnostic Dominance:  Right  Intervention    Cardiac MRI 01/19/2022  IMPRESSION: 1. Severe LV dilatation with severe systolic dysfunction (EF 62%). Thinning/akinesis of inferior and lateral walls.   2.  Mild RV dilatation with mild systolic dysfunction (EF 70%)   3. While no late gadolinium enhancement is seen, there is severe thinning (less than 4.45m) in the apical to basal lateral wall and apical inferior wall, suggesting nonviability in these regions.   4.  Moderate mitral regurgitation (regurgitant fraction 27%)   5.  Moderate tricuspid regurgitation (regurgitant fraction 25%)     Electronically Signed   By: COswaldo MilianM.D.   On: 01/20/2022 23:31   Assessment & Plan   1.  Chronic systolic CHF-stable dyspnea.  No increased activity intolerance.  Reviewed ICD implantation.  Plans to proceed with device implantation with Dr. CCurt Bears11/2/23.  (BiV ICD insertion CRT-D-D).  Did not tolerate Entresto due to low blood pressure. Continue carvedilol, Jardiance, furosemide Heart healthy low-sodium diet Increase physical activity as tolerated  Coronary artery disease-no chest pain today.  Status post CABG x3.  Cardiac catheterization 12/15/2021 showed patency of LIMA-LAD and interval occlusion of SVG-PDA and SVG-PLA.  Noted to have normal LV EDP. Continue carvedilol, rosuvastatin Heart healthy low-sodium diet Increase physical activity as tolerated  Atrial fibrillation-heart rate today 68 bpm.  Reports compliance with Eliquis.  Denies bleeding issues. Continue apixaban, carvedilol Increase physical activity as tolerated  Hyperlipidemia-LDL 46 on 12/10/21 Continue aspirin, rosuvastatin Heart healthy low-sodium diet-salty 6 given Increase physical activity as tolerated  Essential hypertension-BP today112/54 Continue carvedilol, Heart healthy  low-sodium diet-salty 6 given Increase physical activity as tolerated   Disposition: Follow-up with Dr. Debara Pickett in 4-6 months.   [name] Ng.  [name] Colter NP-C     04/27/2022, 2:47 PM [address] Office [phone] Fax [phone]  Notice: This dictation was prepared with Dragon dictation along with smaller phrase technology. Any transcriptional errors that result from this process are unintentional and may not be corrected upon review.  I spent 15 minutes examining this patient, reviewing medications, and using patient centered shared decision making involving her cardiac care.  Prior to her visit I spent greater than 20 minutes reviewing her past medical history,  medications, and prior cardiac tests.

## 2022-04-28 LAB — CBC
Hematocrit: 38.2 % (ref 37.5–51.0)
Hemoglobin: 12.9 g/dL — ABNORMAL LOW (ref 13.0–17.7)
MCH: 30.1 pg (ref 26.6–33.0)
MCHC: 33.8 g/dL (ref 31.5–35.7)
MCV: 89 fL (ref 79–97)
Platelets: 219 10*3/uL (ref 150–450)
RBC: 4.29 x10E6/uL (ref 4.14–5.80)
RDW: 13.8 % (ref 11.6–15.4)
WBC: 5.9 10*3/uL (ref 3.4–10.8)

## 2022-04-28 LAB — BASIC METABOLIC PANEL
BUN/Creatinine Ratio: 18 (ref 10–24)
BUN: 23 mg/dL (ref 8–27)
CO2: 23 mmol/L (ref 20–29)
Calcium: 8.8 mg/dL (ref 8.6–10.2)
Chloride: 103 mmol/L (ref 96–106)
Creatinine, Ser: 1.29 mg/dL — ABNORMAL HIGH (ref 0.76–1.27)
Glucose: 165 mg/dL — ABNORMAL HIGH (ref 70–99)
Potassium: 4.4 mmol/L (ref 3.5–5.2)
Sodium: 142 mmol/L (ref 134–144)
eGFR: 57 mL/min/{1.73_m2} — ABNORMAL LOW (ref >59)

## 2022-04-29 NOTE — Pre-Procedure Instructions (Signed)
Instructed patient on the following items: Arrival time 1400 Nothing to eat or drink after midnight No meds AM of procedure Responsible person to drive you home and stay with you for 24 hrs Wash with special soap night before and morning of procedure If on anti-coagulant drug instructions Eliquis- don't take tonight or tomorrow am dose

## 2022-04-30 ENCOUNTER — Encounter (HOSPITAL_COMMUNITY)
Admission: RE | Disposition: A | Discharge status: Home / Self Care | Payer: Self-pay | Source: Home / Self Care | Attending: Cardiology

## 2022-04-30 ENCOUNTER — Ambulatory Visit (HOSPITAL_COMMUNITY): Payer: Medicare Other

## 2022-04-30 ENCOUNTER — Ambulatory Visit (HOSPITAL_COMMUNITY)
Admission: RE | Admit: 2022-04-30 | Discharge: 2022-04-30 | Disposition: A | Discharge status: Home / Self Care | Payer: Medicare Other | Attending: Cardiology | Admitting: Cardiology

## 2022-04-30 DIAGNOSIS — I447 Left bundle-branch block, unspecified: Secondary | ICD-10-CM | POA: Diagnosis not present

## 2022-04-30 DIAGNOSIS — Z87891 Personal history of nicotine dependence: Secondary | ICD-10-CM | POA: Insufficient documentation

## 2022-04-30 DIAGNOSIS — Z95810 Presence of automatic (implantable) cardiac defibrillator: Secondary | ICD-10-CM | POA: Diagnosis not present

## 2022-04-30 DIAGNOSIS — I110 Hypertensive heart disease with heart failure: Secondary | ICD-10-CM | POA: Insufficient documentation

## 2022-04-30 DIAGNOSIS — I2510 Atherosclerotic heart disease of native coronary artery without angina pectoris: Secondary | ICD-10-CM | POA: Insufficient documentation

## 2022-04-30 DIAGNOSIS — I255 Ischemic cardiomyopathy: Secondary | ICD-10-CM | POA: Insufficient documentation

## 2022-04-30 DIAGNOSIS — I5022 Chronic systolic (congestive) heart failure: Secondary | ICD-10-CM | POA: Insufficient documentation

## 2022-04-30 DIAGNOSIS — Z951 Presence of aortocoronary bypass graft: Secondary | ICD-10-CM | POA: Insufficient documentation

## 2022-04-30 DIAGNOSIS — I252 Old myocardial infarction: Secondary | ICD-10-CM | POA: Diagnosis not present

## 2022-04-30 DIAGNOSIS — E119 Type 2 diabetes mellitus without complications: Secondary | ICD-10-CM | POA: Insufficient documentation

## 2022-04-30 DIAGNOSIS — I517 Cardiomegaly: Secondary | ICD-10-CM | POA: Diagnosis not present

## 2022-04-30 DIAGNOSIS — G4730 Sleep apnea, unspecified: Secondary | ICD-10-CM | POA: Diagnosis not present

## 2022-04-30 HISTORY — PX: BIV ICD INSERTION CRT-D: EP1195

## 2022-04-30 LAB — GLUCOSE, CAPILLARY
Glucose-Capillary: 169 mg/dL — ABNORMAL HIGH (ref 70–99)
Glucose-Capillary: 132 mg/dL — ABNORMAL HIGH (ref 70–99)

## 2022-04-30 SURGERY — BIV ICD INSERTION CRT-D

## 2022-04-30 MED ORDER — LIDOCAINE HCL (PF) 1 % IJ SOLN
INTRAMUSCULAR | Status: AC
  Filled 2022-04-30: qty 60

## 2022-04-30 MED ORDER — MIDAZOLAM HCL 5 MG/5ML IJ SOLN
INTRAMUSCULAR | Status: DC | PRN
  Administered 2022-04-30: 1 mg via INTRAVENOUS

## 2022-04-30 MED ORDER — SODIUM CHLORIDE 0.9 % IV SOLN
80.0000 mg | INTRAVENOUS | Status: AC
  Administered 2022-04-30: 80 mg

## 2022-04-30 MED ORDER — FENTANYL CITRATE (PF) 100 MCG/2ML IJ SOLN
INTRAMUSCULAR | Status: DC | PRN
  Administered 2022-04-30: 25 ug via INTRAVENOUS

## 2022-04-30 MED ORDER — CEFAZOLIN SODIUM-DEXTROSE 1-4 GM/50ML-% IV SOLN
1.0000 g | Freq: Four times a day (QID) | INTRAVENOUS | Status: DC
  Administered 2022-04-30: 1 g via INTRAVENOUS
  Filled 2022-04-30: qty 50

## 2022-04-30 MED ORDER — MIDAZOLAM HCL 5 MG/5ML IJ SOLN
INTRAMUSCULAR | Status: AC
  Filled 2022-04-30: qty 5

## 2022-04-30 MED ORDER — IODIXANOL 320 MG/ML IV SOLN
INTRAVENOUS | Status: DC | PRN
  Administered 2022-04-30: 8 mL

## 2022-04-30 MED ORDER — ONDANSETRON HCL 4 MG/2ML IJ SOLN: 4.0000 mg | Freq: Four times a day (QID) | INTRAMUSCULAR | Status: DC | PRN

## 2022-04-30 MED ORDER — ACETAMINOPHEN 325 MG PO TABS
325.0000 mg | ORAL_TABLET | ORAL | Status: DC | PRN
  Administered 2022-04-30: 650 mg via ORAL
  Filled 2022-04-30: qty 2

## 2022-04-30 MED ORDER — SODIUM CHLORIDE 0.9 % IV SOLN
INTRAVENOUS | Status: AC
  Filled 2022-04-30: qty 2

## 2022-04-30 MED ORDER — HEPARIN (PORCINE) IN NACL 1000-0.9 UT/500ML-% IV SOLN
INTRAVENOUS | Status: AC
  Filled 2022-04-30: qty 500

## 2022-04-30 MED ORDER — LIDOCAINE HCL (PF) 1 % IJ SOLN
INTRAMUSCULAR | Status: DC | PRN
  Administered 2022-04-30: 60 mL

## 2022-04-30 MED ORDER — SODIUM CHLORIDE 0.9 % IV SOLN: INTRAVENOUS | Status: DC

## 2022-04-30 MED ORDER — CHLORHEXIDINE GLUCONATE 4 % EX LIQD: 4.0000 | Freq: Once | CUTANEOUS | Status: DC

## 2022-04-30 MED ORDER — HEPARIN (PORCINE) IN NACL 1000-0.9 UT/500ML-% IV SOLN
INTRAVENOUS | Status: DC | PRN
  Administered 2022-04-30: 500 mL

## 2022-04-30 MED ORDER — CEFAZOLIN SODIUM-DEXTROSE 2-4 GM/100ML-% IV SOLN
INTRAVENOUS | Status: AC
  Filled 2022-04-30: qty 100

## 2022-04-30 MED ORDER — FENTANYL CITRATE (PF) 100 MCG/2ML IJ SOLN
INTRAMUSCULAR | Status: AC
  Filled 2022-04-30: qty 2

## 2022-04-30 MED ORDER — CEFAZOLIN SODIUM-DEXTROSE 2-4 GM/100ML-% IV SOLN
2.0000 g | INTRAVENOUS | Status: AC
  Administered 2022-04-30: 2 g via INTRAVENOUS

## 2022-04-30 SURGICAL SUPPLY — 15 items
BALLN ATTAIN 80 (BALLOONS) ×1
BALLOON ATTAIN 80 (BALLOONS) IMPLANT
CABLE SURGICAL S-101-97-12 (CABLE) ×1 IMPLANT
CATH ATTAIN COM SURV 6250V-MB2 (CATHETERS) IMPLANT
ICD CLARIA MRI DTMA1QQ (ICD Generator) IMPLANT
LEAD ATTAIN PERFORMA S 4598-88 (Lead) IMPLANT
LEAD CAPSURE NOVUS 5076-52CM (Lead) IMPLANT
LEAD SPRINT QUAT SEC 6935M-62 (Lead) IMPLANT
PAD DEFIB RADIO PHYSIO CONN (PAD) ×1 IMPLANT
SHEATH 7FR PRELUDE SNAP 13 (SHEATH) IMPLANT
SHEATH 9FR PRELUDE SNAP 13 (SHEATH) IMPLANT
SLITTER 6232ADJ (MISCELLANEOUS) IMPLANT
TRAY PACEMAKER INSERTION (PACKS) ×1 IMPLANT
WIRE ACUITY WHISPER EDS 4648 (WIRE) IMPLANT
WIRE HI TORQ VERSACORE-J 145CM (WIRE) IMPLANT

## 2022-04-30 NOTE — H&P (Signed)
Electrophysiology Office Note   Date:  04/30/2022   ID:  SKYLOR SCHNAPP, DOB 1945/10/15, MRN [id number]  PCP:  [name] [name], MD  Cardiologist:  Debara Pickett Primary Electrophysiologist:  Juwann Sherk Meredith Leeds, MD    Chief Complaint: CHF   History of Present Illness: [name] [name] is a 76 y.o. male who is being seen today for the evaluation of CHF at the request of No ref. provider found. Presenting today for electrophysiology evaluation.  He has a history significant for coronary artery disease status post CABG, chronic systolic heart failure, diabetes, hypertension, sleep apnea.  He had a cardiac MRI that showed thinning and akinesis of the inferior lateral walls, but no scar.  He had a left heart catheterization recently that showed interval occlusion of the SVG to PDA and SVG to PLA.  His LIMA to LAD was patent.  His main symptoms are fatigue and shortness of breath.  He also has had some low blood pressures.  He states that he would be happy to do just about any intervention to feel better.  Today, denies symptoms of palpitations, chest pain, shortness of breath, orthopnea, PND, lower extremity edema, claudication, dizziness, presyncope, syncope, bleeding, or neurologic sequela. The patient is tolerating medications without difficulties. Plan CRT-D implant today.    Past Medical History:  Diagnosis Date   Arthritis    Knee L - probably   Atrial fibrillation (HCC)    BPH (benign prostatic hyperplasia)    CAD (coronary artery disease) 07/06/2011   CHF (congestive heart failure) (HCC)    Coronary artery disease    Diabetes mellitus    Frequency of urination    Heart failure (HCC)    High cholesterol    History of nuclear stress test 09/2010   dipyridamole; mild perfusion defect due to attenuation with mild superimposed ischemia in apical septal, apical, apical inferior & apical lateral regions; rest LV enlarged in size; prominent gut uptake in infero-apical region; no significant  ischemia demonstrated; low risk scan    HNP (herniated nucleus pulposus), lumbar    Hypertension    Ischemic cardiomyopathy    LV dysfunction 07/06/2011   Nocturia    Right bundle branch block    S/P CABG (coronary artery bypass graft) 08/03/2011   x3; LIMA to LAD,, SVG to PDA, SVG to posterolateral branch of RCA; Dr. Ellison Hughs   Sleep apnea    sleep study 10/2010- AHI during total sleep 32.1/hr and during REM 62.3/hr (severe sleep apnea)unable to tolerate c pap   Spinal stenosis of lumbar region    Stroke [facility])    [place]    Past Surgical History:  Procedure Laterality Date   ABDOMINAL AORTOGRAM W/LOWER EXTREMITY Left 11/14/2020   Procedure: ABDOMINAL AORTOGRAM W/LOWER EXTREMITY;  Surgeon: [name], MD;  Location: [place] [facility];  Service: Cardiovascular;  Laterality: Left;   ABDOMINAL AORTOGRAM W/LOWER EXTREMITY N/A 01/02/2021   Procedure: ABDOMINAL AORTOGRAM W/LOWER EXTREMITY;  Surgeon: [name], MD;  Location: [place] [facility];  Service: Cardiovascular;  Laterality: N/A;   AMPUTATION Left 03/20/2021   Procedure: Left foot 5th ray amputation;  Surgeon: [name], MD;  Location: [place];  Service: Orthopedics;  Laterality: Left;   CARDIAC CATHETERIZATION  01/2011   ischemic cardiomyopathy 30-35%, multivessel CAD (Dr. [name])    CARDIAC CATHETERIZATION  09/13/2015   Procedure: Right/Left Heart Cath and Coronary/Graft Angiography;  Surgeon: [name], MD;  Location: [place] [facility];  Service: Cardiovascular;;  CARDIOVERSION N/A 05/16/2019   Procedure: CARDIOVERSION;  Surgeon: [name], MD;  Location: [facility] ENDOSCOPY;  Service: Cardiovascular;  Laterality: N/A;   Colonscopy     CORONARY ARTERY BYPASS GRAFT  08/03/2011   Procedure: CORONARY ARTERY BYPASS GRAFTING (CABG);  Surgeon: [name], MD;  Location: [place];  Service: Open Heart Surgery;  Laterality: N/A;  CABG times three using right saphenous vein and left mammary artery usisng  endoscope.   LEFT HEART CATH AND CORS/GRAFTS ANGIOGRAPHY N/A 12/15/2021   Procedure: LEFT HEART CATH AND CORS/GRAFTS ANGIOGRAPHY;  Surgeon: [name], MD;  Location: [place] [facility];  Service: Cardiovascular;  Laterality: N/A;   LEFT HEART CATHETERIZATION WITH CORONARY/GRAFT ANGIOGRAM N/A 09/20/2013   Procedure: LEFT HEART CATHETERIZATION WITH [name];  Surgeon: [name], MD;  Location: [facility] CATH LAB;  Service: Cardiovascular;  Laterality: N/A;   Lower Extremity Arterial Doppler  03/16/2013   bilat ABIs demonstated normal values; R runoff - posterior tibial & anterior tibial arteries occluded; L runoff - peroneal & posterior tibial arteries occluded, anterior tibial artery appears occluded   LUMBAR LAMINECTOMY/DECOMPRESSION MICRODISCECTOMY N/A 09/12/2014   Procedure: LUMBAR DECOMPRESSION L3-L4, L4-L5, MICRODISCECTOMY L4-L5;  Surgeon: [name], MD;  Location: [facility];  Service: Orthopedics;  Laterality: N/A;   PERIPHERAL VASCULAR BALLOON ANGIOPLASTY  11/14/2020   Procedure: PERIPHERAL VASCULAR BALLOON ANGIOPLASTY;  Surgeon: [name], MD;  Location: [place] [facility];  Service: Cardiovascular;;   PERIPHERAL VASCULAR BALLOON ANGIOPLASTY Left 01/02/2021   Procedure: PERIPHERAL VASCULAR BALLOON ANGIOPLASTY;  Surgeon: [name], MD;  Location: [place] [facility];  Service: Cardiovascular;  Laterality: Left;  Failed PTA   Pilonidal Cyst removed     TRANSTHORACIC ECHOCARDIOGRAM  11/10/2012   EF 40-45%, mild LVH, mild hypokinesis of anteroseptal myocardium, grade 1 diastolic dysfunction; mild MR & calcifed mitral annulus; LA mild-mod dilated; RA mildly dilated     Current Facility-Administered Medications  Medication Dose Route Frequency Provider Last Rate Last Admin   0.9 %  sodium chloride infusion   Intravenous Continuous Edyth Glomb Hassell Done, MD       ceFAZolin (ANCEF) IVPB 2g/100 mL premix  2 g Intravenous On Call Jinnifer Montejano Hassell Done, MD        chlorhexidine (HIBICLENS) 4 % liquid 4 Application  4 Application Topical Once Caige Almeda Hassell Done, MD       gentamicin (GARAMYCIN) 80 mg in sodium chloride 0.9 % 500 mL irrigation  80 mg Irrigation On Call Dawnell Bryant, Ocie Doyne, MD        Allergies:   Delene Loll [sacubitril-valsartan] and Sacubitril   Social History:  The patient  reports that he quit smoking about 10 years ago. His smoking use included cigars. He quit smokeless tobacco use about 10 years ago. He reports current alcohol use of about 1.0 - 2.0 standard drink of alcohol per week. He reports that he does not use drugs.   Family History:  The patient's family history includes Anesthesia problems in his daughter; Cancer in his mother; Colon cancer in his sister; Colon polyps in his father; Lung disease in his father; Sudden death in his maternal grandmother.   ROS:  Please see the history of present illness.   Otherwise, review of systems is positive for none.   All other systems are reviewed and negative.   PHYSICAL EXAM: VS:  There were no vitals taken for this visit. , BMI There is no height or weight on file to calculate BMI. GEN: Well nourished,  well developed, in no acute distress  HEENT: normal  Neck: no JVD, carotid bruits, or masses Cardiac: RRR; no murmurs, rubs, or gallops,no edema  Respiratory:  clear to auscultation bilaterally, normal work of breathing GI: soft, nontender, nondistended, + BS MS: no deformity or atrophy  Skin: warm and dry Neuro:  Strength and sensation are intact Psych: euthymic mood, full affect   Recent Labs: 11/17/2021: ALT 13 12/09/2021: BNP 482.4 04/27/2022: BUN 23; Creatinine, Ser 1.29; Hemoglobin 12.9; Platelets 219; Potassium 4.4; Sodium 142    Lipid Panel     Component Value Date/Time   CHOL 98 (L) 12/10/2021 0832   TRIG 88 12/10/2021 0832   HDL 35 (L) 12/10/2021 0832   CHOLHDL 2.8 12/10/2021 0832   LDLCALC 46 12/10/2021 0832     Wt Readings from Last 3 Encounters:  04/27/22  96.6 kg  03/10/22 95.8 kg  01/21/22 98.3 kg      Other studies Reviewed: Additional studies/ records that were reviewed today include: LHC 12/15/21  Review of the above records today demonstrates:  1.  Severe native coronary artery disease with patency of the left main, total occlusion of the LAD just after the first diagonal, multiple severe calcific stenoses throughout the entirety of the circumflex distribution, and moderate stenoses in the RCA 2.  Status post CABG with continued patency of the LIMA to LAD, and interval occlusion of the SVG to PDA and SVG to PLA 3.  Normal LVEDP  Cardiac MRI 01/20/2022 1. Severe LV dilatation with severe systolic dysfunction (EF 53%). Thinning/akinesis of inferior and lateral walls.   2.  Mild RV dilatation with mild systolic dysfunction (EF 61%)   3. While no late gadolinium enhancement is seen, there is severe thinning (less than 4.33m) in the apical to basal lateral wall and apical inferior wall, suggesting nonviability in these regions.   4.  Moderate mitral regurgitation (regurgitant fraction 27%)   5.  Moderate tricuspid regurgitation (regurgitant fraction 25%)  ASSESSMENT AND PLAN:  1.  Chronic systolic heart failure:  ICD Criteria  Current LVEF:28%. Within 12 months prior to implant: Yes   Heart failure history: Yes, Class II  Cardiomyopathy history: Yes, Ischemic Cardiomyopathy - Prior MI.  Atrial Fibrillation/Atrial Flutter: No.  Ventricular tachycardia history: No.  Cardiac arrest history: No.  History of syndromes with risk of sudden death: No.  Previous ICD: No.  Current ICD indication: Primary  PPM indication: No.  Class I or II Bradycardia indication present: No  Beta Blocker therapy for 3 or more months: Yes, prescribed.   Ace Inhibitor/ARB therapy for 3 or more months: Yes, prescribed.    I have seen [name] WILTSEYis a 76y.o. malepre-procedural and [facility] primary prevention of sudden death.  The patient's chart has been reviewed and they meet criteria for ICD implant.  I have had a thorough discussion with the patient reviewing options.  The patient and their family (if available) have had opportunities to ask questions and have them answered. The patient and I have decided together through the [employer] to implant ICD at this time.  Risks, benefits, alternatives to ICD implantation were discussed in detail with the patient today. The patient  understands that the risks include but are not limited to bleeding, infection, pneumothorax, perforation, tamponade, vascular damage, renal failure, MI, stroke, death, inappropriate shocks, and lead dislodgement and wishes to proceed.

## 2022-04-30 NOTE — Progress Notes (Signed)
Patient returned from cath lab and site looked swollen. LB, RN and [place], RN stated that this was how it looked immediately after the procedure and it was the device itself. Cam[place], MD by to see patient as well. No further instructions or orders given. Patient denies pain at site. Will continue to monitor.

## 2022-04-30 NOTE — Discharge Instructions (Signed)
After Your ICD (Implantable Cardiac Defibrillator)   You have a [employer] ICD  ACTIVITY Do not lift your arm above shoulder height for 1 week after your procedure. After 7 days, you may progress as below.  You should remove your sling 24 hours after your procedure, unless otherwise instructed by your provider.     Thursday May 07, 2022  Friday May 08, 2022 Saturday May 09, 2022 Sunday May 10, 2022   Do not lift, push, pull, or carry anything over 10 pounds with the affected arm until 6 weeks (Thursday June 11, 2022 ) after your procedure.   You may drive AFTER your wound check, unless you have been told otherwise by your provider.   Ask your healthcare provider when you can go back to work   INCISION/Dressing If you are on a blood thinner such as Coumadin, Xarelto, Eliquis, Plavix, or Pradaxa please confirm with your provider when this should be resumed.   If large square, outer bandage is left in place, this can be removed after 24 hours from your procedure. Do not remove steri-strips or glue as below.   Monitor your defibrillator site for redness, swelling, and drainage. Call the device clinic at [phone] if you experience these symptoms or fever/chills.  If your incision is sealed with Steri-strips or staples, you may shower 7 days after your procedure or when told by your provider. Do not remove the steri-strips or let the shower hit directly on your site. You may wash around your site with soap and water.    If you were discharged in a sling, please do not wear this during the day more than 48 hours after your surgery unless otherwise instructed. This may increase the risk of stiffness and soreness in your shoulder.   Avoid lotions, ointments, or perfumes over your incision until it is well-healed.  You may use a hot tub or a pool AFTER your wound check appointment if the incision is completely closed.  Your ICD is designed to protect you from life  threatening heart rhythms. Because of this, you may receive a shock.   1 shock with no symptoms:  Call the office during business hours. 1 shock with symptoms (chest pain, chest pressure, dizziness, lightheadedness, shortness of breath, overall feeling unwell):  Call 911. If you experience 2 or more shocks in 24 hours:  Call 911. If you receive a shock, you should not drive for 6 months per the [place] DMV IF you receive appropriate therapy from your ICD.   ICD Alerts:  Some alerts are vibratory and others beep. These are NOT emergencies. Please call our office to let us know. If this occurs at night or on weekends, it can wait until the next business day. Send a remote transmission.  If your device is capable of reading fluid status (for heart failure), you will be offered monthly monitoring to review this with you.   DEVICE MANAGEMENT Remote monitoring is used to monitor your ICD from home. This monitoring is scheduled every 91 days by our office. It allows [place] to keep an eye on the functioning of your device to ensure it is working properly. You will routinely see your Electrophysiologist annually (more often if necessary).   You should receive your ID card for your new device in 4-8 weeks. Keep this card with you at all times once received. Consider wearing a medical alert bracelet or necklace.  Your ICD  may be MRI compatible. This will be discussed at your next  office visit/wound check.  You should avoid contact with strong electric or magnetic fields.   Do not use amateur (ham) radio equipment or electric (arc) welding torches. MP3 player headphones with magnets should not be used. Some devices are safe to use if held at least 12 inches (30 cm) from your defibrillator. These include power tools, lawn mowers, and speakers. If you are unsure if something is safe to use, ask your health care provider.  When using your cell phone, hold it to the ear that is on the opposite side from the  defibrillator. Do not leave your cell phone in a pocket over the defibrillator.  You may safely use electric blankets, heating pads, computers, and microwave ovens.  Call the office right away if: You have chest pain. You feel more than one shock. You feel more short of breath than you have felt before. You feel more light-headed than you have felt before. Your incision starts to open up.  This information is not intended to replace advice given to you by your health care provider. Make sure you discuss any questions you have with your health care provider.

## 2022-04-30 NOTE — Progress Notes (Signed)
Called and relayed results of patients CXR to Dr. Curt Bears. MD stated okay to discharge based on results.

## 2022-05-01 ENCOUNTER — Telehealth: Payer: Self-pay

## 2022-05-01 ENCOUNTER — Encounter (HOSPITAL_COMMUNITY): Payer: Self-pay | Admitting: Cardiology

## 2022-05-01 NOTE — Telephone Encounter (Signed)
Follow-up after same day discharge: Implant date: 04/30/22 MD: Allegra Lai, MD Device: ICD Location: L.Chest    Wound check visit: 05/13/22 90 day MD follow-up: 08/07/22  Remote Transmission received:Yes  Dressing/sling removed: Yes   Confirm [place] restart on: 05/03/22   Reviewed lifting restrictions and wound care, patient voiced understanding

## 2022-05-01 NOTE — Telephone Encounter (Signed)
-----   Message from [name] [name], [place] sent at 04/30/2022  9:09 PM EDT ----- Regarding: Same Day Discharge ICD 04/30/22 Dr. Curt Bears

## 2022-05-13 ENCOUNTER — Ambulatory Visit: Payer: Medicare Other | Attending: Internal Medicine

## 2022-05-13 DIAGNOSIS — I255 Ischemic cardiomyopathy: Secondary | ICD-10-CM | POA: Diagnosis not present

## 2022-05-13 NOTE — Patient Instructions (Signed)
   After Your ICD (Implantable Cardiac Defibrillator)    Monitor your defibrillator site for redness, swelling, and drainage. Call the device clinic at [phone] if you experience these symptoms or fever/chills.  Your incision was closed with Steri-strips or staples:  You may shower 7 days after your procedure and wash your incision with soap and water. Avoid lotions, ointments, or perfumes over your incision until it is well-healed.  You do show evidence of a hematoma today at your device site.  Please hold your Eliquis X 1 week and monitor for signs of decreased swelling at site. If area increases with swelling or you note redness, drainage or feel feverish or chills, contact our office immediately at the number listed above.   You may use a hot tub or a pool after your wound check appointment if the incision is completely closed.  Do not lift, push or pull greater than 10 pounds with the affected arm until 6 weeks after your procedure. There are no other restrictions in arm movement after your wound check appointment. Until After December 14th.   Your ICD is designed to protect you from life threatening heart rhythms. Because of this, you may receive a shock.   1 shock with no symptoms:  Call the office during business hours. 1 shock with symptoms (chest pain, chest pressure, dizziness, lightheadedness, shortness of breath, overall feeling unwell):  Call 911. If you experience 2 or more shocks in 24 hours:  Call 911. If you receive a shock, you should not drive.  [place] DMV - no driving for 6 months if you receive appropriate therapy from your ICD.   ICD Alerts:  Some alerts are vibratory and others beep. These are NOT emergencies. Please call our office to let us know. If this occurs at night or on weekends, it can wait until the next business day. Send a remote transmission.  If your device is capable of reading fluid status (for heart failure), you will be offered monthly monitoring to  review this with you.   Remote monitoring is used to monitor your ICD from home. This monitoring is scheduled every 91 days by our office. It allows [place] to keep an eye on the functioning of your device to ensure it is working properly. You will routinely see your Electrophysiologist annually (more often if necessary).

## 2022-05-14 LAB — CUP PACEART INCLINIC DEVICE CHECK
Battery Remaining Longevity: 102 mo
Battery Voltage: 3.1 V
Brady Statistic AP VP Percent: 10.98 %
Brady Statistic AP VS Percent: 0.09 %
Brady Statistic AS VP Percent: 88.45 %
Brady Statistic AS VS Percent: 0.48 %
Brady Statistic RA Percent Paced: 10.69 %
Brady Statistic RV Percent Paced: 95.12 %
Date Time Interrogation Session: 20231115124500
HighPow Impedance: 57 Ohm
Implantable Lead Connection Status: 753985
Implantable Lead Connection Status: 753985
Implantable Lead Connection Status: 753985
Implantable Lead Implant Date: 20231102
Implantable Lead Implant Date: 20231102
Implantable Lead Implant Date: 20231102
Implantable Lead Location: 753858
Implantable Lead Location: 753859
Implantable Lead Location: 753860
Implantable Lead Model: 4598
Implantable Lead Model: 5076
Implantable Lead Model: 6935
Implantable Pulse Generator Implant Date: 20231102
Lead Channel Impedance Value: 171 Ohm
Lead Channel Impedance Value: 180 Ohm
Lead Channel Impedance Value: 180 Ohm
Lead Channel Impedance Value: 184.154
Lead Channel Impedance Value: 184.154
Lead Channel Impedance Value: 323 Ohm
Lead Channel Impedance Value: 342 Ohm
Lead Channel Impedance Value: 342 Ohm
Lead Channel Impedance Value: 380 Ohm
Lead Channel Impedance Value: 399 Ohm
Lead Channel Impedance Value: 437 Ohm
Lead Channel Impedance Value: 513 Ohm
Lead Channel Impedance Value: 570 Ohm
Lead Channel Impedance Value: 627 Ohm
Lead Channel Impedance Value: 646 Ohm
Lead Channel Impedance Value: 646 Ohm
Lead Channel Impedance Value: 665 Ohm
Lead Channel Impedance Value: 703 Ohm
Lead Channel Pacing Threshold Amplitude: 0.375 V
Lead Channel Pacing Threshold Amplitude: 0.625 V
Lead Channel Pacing Threshold Amplitude: 0.875 V
Lead Channel Pacing Threshold Pulse Width: 0.4 ms
Lead Channel Pacing Threshold Pulse Width: 0.4 ms
Lead Channel Pacing Threshold Pulse Width: 0.4 ms
Lead Channel Sensing Intrinsic Amplitude: 3.625 mV
Lead Channel Sensing Intrinsic Amplitude: 3.75 mV
Lead Channel Sensing Intrinsic Amplitude: 5.5 mV
Lead Channel Sensing Intrinsic Amplitude: 5.5 mV
Lead Channel Setting Pacing Amplitude: 2 V
Lead Channel Setting Pacing Amplitude: 3.5 V
Lead Channel Setting Pacing Amplitude: 3.5 V
Lead Channel Setting Pacing Pulse Width: 0.4 ms
Lead Channel Setting Pacing Pulse Width: 0.4 ms
Lead Channel Setting Sensing Sensitivity: 0.3 mV
Zone Setting Status: 755011

## 2022-05-19 ENCOUNTER — Encounter: Payer: Self-pay | Admitting: Cardiology

## 2022-05-20 NOTE — Telephone Encounter (Addendum)
Called patient to discuss his questions and concerns:   Patient is to resume Eliquis on Thursday am (tomorrow morning).   Patient states area is getting significantly smaller. No signs of infection.    3.  Educated patient that the hematoma healing process can take up to a few weeks but main thing is to ensure it doesn't start getting bigger again or develop any s/s of infection which we reviewed.   4.  If area starts getting bigger again after restarting the Eliquis over the holiday weekend, he should stop it and call us Monday morning.   5.  If he develops any concerning signs of infection over the extended holiday, he should go to urgent care/ER for evaluation.    Patient verbalized understanding and has no further questions.

## 2022-05-25 DIAGNOSIS — Z85828 Personal history of other malignant neoplasm of skin: Secondary | ICD-10-CM | POA: Diagnosis not present

## 2022-05-25 DIAGNOSIS — D225 Melanocytic nevi of trunk: Secondary | ICD-10-CM | POA: Diagnosis not present

## 2022-05-25 DIAGNOSIS — Z08 Encounter for follow-up examination after completed treatment for malignant neoplasm: Secondary | ICD-10-CM | POA: Diagnosis not present

## 2022-06-01 DIAGNOSIS — E1122 Type 2 diabetes mellitus with diabetic chronic kidney disease: Secondary | ICD-10-CM | POA: Diagnosis not present

## 2022-06-01 DIAGNOSIS — E7849 Other hyperlipidemia: Secondary | ICD-10-CM | POA: Diagnosis not present

## 2022-06-01 DIAGNOSIS — I10 Essential (primary) hypertension: Secondary | ICD-10-CM | POA: Diagnosis not present

## 2022-06-01 DIAGNOSIS — E7801 Familial hypercholesterolemia: Secondary | ICD-10-CM | POA: Diagnosis not present

## 2022-06-01 DIAGNOSIS — E1165 Type 2 diabetes mellitus with hyperglycemia: Secondary | ICD-10-CM | POA: Diagnosis not present

## 2022-06-01 DIAGNOSIS — G4733 Obstructive sleep apnea (adult) (pediatric): Secondary | ICD-10-CM | POA: Diagnosis not present

## 2022-06-01 DIAGNOSIS — E1140 Type 2 diabetes mellitus with diabetic neuropathy, unspecified: Secondary | ICD-10-CM | POA: Diagnosis not present

## 2022-06-04 DIAGNOSIS — I255 Ischemic cardiomyopathy: Secondary | ICD-10-CM | POA: Diagnosis not present

## 2022-06-04 DIAGNOSIS — E1122 Type 2 diabetes mellitus with diabetic chronic kidney disease: Secondary | ICD-10-CM | POA: Diagnosis not present

## 2022-06-04 DIAGNOSIS — I10 Essential (primary) hypertension: Secondary | ICD-10-CM | POA: Diagnosis not present

## 2022-06-04 DIAGNOSIS — I739 Peripheral vascular disease, unspecified: Secondary | ICD-10-CM | POA: Diagnosis not present

## 2022-06-04 DIAGNOSIS — R5383 Other fatigue: Secondary | ICD-10-CM | POA: Diagnosis not present

## 2022-06-04 DIAGNOSIS — R42 Dizziness and giddiness: Secondary | ICD-10-CM | POA: Diagnosis not present

## 2022-06-04 DIAGNOSIS — N401 Enlarged prostate with lower urinary tract symptoms: Secondary | ICD-10-CM | POA: Diagnosis not present

## 2022-06-04 DIAGNOSIS — E11621 Type 2 diabetes mellitus with foot ulcer: Secondary | ICD-10-CM | POA: Diagnosis not present

## 2022-06-04 DIAGNOSIS — Z23 Encounter for immunization: Secondary | ICD-10-CM | POA: Diagnosis not present

## 2022-06-04 DIAGNOSIS — E1165 Type 2 diabetes mellitus with hyperglycemia: Secondary | ICD-10-CM | POA: Diagnosis not present

## 2022-06-04 DIAGNOSIS — I5033 Acute on chronic diastolic (congestive) heart failure: Secondary | ICD-10-CM | POA: Diagnosis not present

## 2022-06-04 DIAGNOSIS — K5901 Slow transit constipation: Secondary | ICD-10-CM | POA: Diagnosis not present

## 2022-08-03 ENCOUNTER — Ambulatory Visit: Payer: Medicare Other

## 2022-08-03 DIAGNOSIS — I255 Ischemic cardiomyopathy: Secondary | ICD-10-CM | POA: Diagnosis not present

## 2022-08-04 LAB — CUP PACEART REMOTE DEVICE CHECK
Battery Remaining Longevity: 99 mo
Battery Voltage: 3.08 V
Brady Statistic AP VP Percent: 6.14 %
Brady Statistic AP VS Percent: 0.02 %
Brady Statistic AS VP Percent: 93.77 %
Brady Statistic AS VS Percent: 0.08 %
Brady Statistic RA Percent Paced: 5.98 %
Brady Statistic RV Percent Paced: 96.84 %
Date Time Interrogation Session: 20240205001804
HighPow Impedance: 53 Ohm
Implantable Lead Connection Status: 753985
Implantable Lead Connection Status: 753985
Implantable Lead Connection Status: 753985
Implantable Lead Implant Date: 20231102
Implantable Lead Implant Date: 20231102
Implantable Lead Implant Date: 20231102
Implantable Lead Location: 753858
Implantable Lead Location: 753859
Implantable Lead Location: 753860
Implantable Lead Model: 4598
Implantable Lead Model: 5076
Implantable Lead Model: 6935
Implantable Pulse Generator Implant Date: 20231102
Lead Channel Impedance Value: 166.114
Lead Channel Impedance Value: 166.114
Lead Channel Impedance Value: 166.114
Lead Channel Impedance Value: 171 Ohm
Lead Channel Impedance Value: 171 Ohm
Lead Channel Impedance Value: 266 Ohm
Lead Channel Impedance Value: 323 Ohm
Lead Channel Impedance Value: 342 Ohm
Lead Channel Impedance Value: 342 Ohm
Lead Channel Impedance Value: 342 Ohm
Lead Channel Impedance Value: 342 Ohm
Lead Channel Impedance Value: 456 Ohm
Lead Channel Impedance Value: 456 Ohm
Lead Channel Impedance Value: 570 Ohm
Lead Channel Impedance Value: 589 Ohm
Lead Channel Impedance Value: 589 Ohm
Lead Channel Impedance Value: 589 Ohm
Lead Channel Impedance Value: 627 Ohm
Lead Channel Pacing Threshold Amplitude: 0.5 V
Lead Channel Pacing Threshold Amplitude: 0.875 V
Lead Channel Pacing Threshold Amplitude: 1 V
Lead Channel Pacing Threshold Pulse Width: 0.4 ms
Lead Channel Pacing Threshold Pulse Width: 0.4 ms
Lead Channel Pacing Threshold Pulse Width: 0.4 ms
Lead Channel Sensing Intrinsic Amplitude: 2.625 mV
Lead Channel Sensing Intrinsic Amplitude: 2.625 mV
Lead Channel Sensing Intrinsic Amplitude: 7.125 mV
Lead Channel Sensing Intrinsic Amplitude: 7.125 mV
Lead Channel Setting Pacing Amplitude: 1.5 V
Lead Channel Setting Pacing Amplitude: 2 V
Lead Channel Setting Pacing Amplitude: 2 V
Lead Channel Setting Pacing Pulse Width: 0.4 ms
Lead Channel Setting Pacing Pulse Width: 0.4 ms
Lead Channel Setting Sensing Sensitivity: 0.3 mV
Zone Setting Status: 755011

## 2022-08-07 ENCOUNTER — Encounter: Payer: Self-pay | Admitting: Cardiology

## 2022-08-07 ENCOUNTER — Ambulatory Visit: Payer: Medicare Other | Attending: Cardiology | Admitting: Cardiology

## 2022-08-07 VITALS — BP 116/60 | HR 67 | Ht 74.0 in | Wt 224.8 lb

## 2022-08-07 DIAGNOSIS — I5022 Chronic systolic (congestive) heart failure: Secondary | ICD-10-CM | POA: Insufficient documentation

## 2022-08-07 DIAGNOSIS — I2510 Atherosclerotic heart disease of native coronary artery without angina pectoris: Secondary | ICD-10-CM | POA: Diagnosis not present

## 2022-08-07 DIAGNOSIS — I10 Essential (primary) hypertension: Secondary | ICD-10-CM

## 2022-08-07 LAB — CUP PACEART INCLINIC DEVICE CHECK
Battery Remaining Longevity: 101 mo
Battery Voltage: 3.07 V
Brady Statistic AP VP Percent: 6.87 %
Brady Statistic AP VS Percent: 0.03 %
Brady Statistic AS VP Percent: 92.71 %
Brady Statistic AS VS Percent: 0.4 %
Brady Statistic RA Percent Paced: 6.65 %
Brady Statistic RV Percent Paced: 94.92 %
Date Time Interrogation Session: 20240209141543
HighPow Impedance: 55 Ohm
Implantable Lead Connection Status: 753985
Implantable Lead Connection Status: 753985
Implantable Lead Connection Status: 753985
Implantable Lead Implant Date: 20231102
Implantable Lead Implant Date: 20231102
Implantable Lead Implant Date: 20231102
Implantable Lead Location: 753858
Implantable Lead Location: 753859
Implantable Lead Location: 753860
Implantable Lead Model: 4598
Implantable Lead Model: 5076
Implantable Lead Model: 6935
Implantable Pulse Generator Implant Date: 20231102
Lead Channel Impedance Value: 156.606
Lead Channel Impedance Value: 160.941
Lead Channel Impedance Value: 160.941
Lead Channel Impedance Value: 166.114
Lead Channel Impedance Value: 166.114
Lead Channel Impedance Value: 266 Ohm
Lead Channel Impedance Value: 304 Ohm
Lead Channel Impedance Value: 323 Ohm
Lead Channel Impedance Value: 342 Ohm
Lead Channel Impedance Value: 342 Ohm
Lead Channel Impedance Value: 380 Ohm
Lead Channel Impedance Value: 456 Ohm
Lead Channel Impedance Value: 456 Ohm
Lead Channel Impedance Value: 532 Ohm
Lead Channel Impedance Value: 570 Ohm
Lead Channel Impedance Value: 570 Ohm
Lead Channel Impedance Value: 589 Ohm
Lead Channel Impedance Value: 627 Ohm
Lead Channel Pacing Threshold Amplitude: 0.5 V
Lead Channel Pacing Threshold Amplitude: 0.875 V
Lead Channel Pacing Threshold Amplitude: 1 V
Lead Channel Pacing Threshold Pulse Width: 0.4 ms
Lead Channel Pacing Threshold Pulse Width: 0.4 ms
Lead Channel Pacing Threshold Pulse Width: 0.4 ms
Lead Channel Sensing Intrinsic Amplitude: 2.125 mV
Lead Channel Sensing Intrinsic Amplitude: 2.5 mV
Lead Channel Sensing Intrinsic Amplitude: 6.625 mV
Lead Channel Sensing Intrinsic Amplitude: 6.875 mV
Lead Channel Setting Pacing Amplitude: 1.5 V
Lead Channel Setting Pacing Amplitude: 2 V
Lead Channel Setting Pacing Amplitude: 2 V
Lead Channel Setting Pacing Pulse Width: 0.4 ms
Lead Channel Setting Pacing Pulse Width: 0.4 ms
Lead Channel Setting Sensing Sensitivity: 0.3 mV
Zone Setting Status: 755011

## 2022-08-07 NOTE — Patient Instructions (Signed)
Medication Instructions:  Your physician recommends that you continue on your current medications as directed. Please refer to the Current Medication list given to you today.  *If you need a refill on your cardiac medications before your next appointment, please call your pharmacy*   Lab Work: None ordered   Testing/Procedures: None ordered   Follow-Up: At [facility], you and your health needs are our priority.  As part of our continuing mission to provide you with exceptional heart care, we have created designated Provider Care Teams.  These Care Teams include your primary Cardiologist (physician) and Advanced Practice Providers (APPs -  Physician Assistants and Nurse Practitioners) who all work together to provide you with the care you need, when you need it.  Remote monitoring is used to monitor your Pacemaker or ICD from home. This monitoring reduces the number of office visits required to check your device to one time per year. It allows [place] to keep an eye on the functioning of your device to ensure it is working properly. You are scheduled for a device check from home on 11/02/22. You may send your transmission at any time that day. If you have a wireless device, the transmission will be sent automatically. After your physician reviews your transmission, you will receive a postcard with your next transmission date.  Your next appointment:   1 year(s)  The format for your next appointment:   In Person  Provider:   Allegra Lai, MD    Thank you for choosing [facility]!!   Trinidad Curet, RN [phone]

## 2022-08-07 NOTE — Progress Notes (Signed)
Electrophysiology Office Note   Date:  08/07/2022   ID:  [name] [name], DOB 1945-12-22, MRN [id number]  PCP:  [name] [name], MD  Cardiologist:  Debara Pickett Primary Electrophysiologist:  Kyllian Clingerman Meredith Leeds, MD    Chief Complaint: CHF   History of Present Illness: [name] [name] is a 77 y.o. male who is being seen today for the evaluation of CHF at the request of [name], Silvestre Moment, MD. Presenting today for electrophysiology evaluation.  He has a history seen for coronary artery disease post CABG, chronic systolic heart failure, hypertension, diabetes, sleep apnea, hyperlipidemia.  Cardiac MRI showed thinning and akinesis of the inferior wall but no scar.  Left heart catheterization showed interval occlusion of SVG to PDA and SVG to PLA.  LIMA to LAD was patent.  He is now status post Medtronic CRT-D implanted 04/30/2022.  Today, denies symptoms of palpitations, chest pain, shortness of breath, orthopnea, PND, lower extremity edema, claudication, dizziness, presyncope, syncope, bleeding, or neurologic sequela. The patient is tolerating medications without difficulties.  He has been feeling better.  He is less short of breath and fatigue.   Past Medical History:  Diagnosis Date   Arthritis    Knee L - probably   Atrial fibrillation (HCC)    BPH (benign prostatic hyperplasia)    CAD (coronary artery disease) 07/06/2011   CHF (congestive heart failure) (HCC)    Coronary artery disease    Diabetes mellitus    Frequency of urination    Heart failure (HCC)    High cholesterol    History of nuclear stress test 09/2010   dipyridamole; mild perfusion defect due to attenuation with mild superimposed ischemia in apical septal, apical, apical inferior & apical lateral regions; rest LV enlarged in size; prominent gut uptake in infero-apical region; no significant ischemia demonstrated; low risk scan    HNP (herniated nucleus pulposus), lumbar    Hypertension    Ischemic cardiomyopathy    LV  dysfunction 07/06/2011   Nocturia    Right bundle branch block    S/P CABG (coronary artery bypass graft) 08/03/2011   x3; LIMA to LAD,, SVG to PDA, SVG to posterolateral branch of RCA; Dr. Ellison Hughs   Sleep apnea    sleep study 10/2010- AHI during total sleep 32.1/hr and during REM 62.3/hr (severe sleep apnea)unable to tolerate c pap   Spinal stenosis of lumbar region    Stroke [place])    [place]    Past Surgical History:  Procedure Laterality Date   ABDOMINAL AORTOGRAM W/LOWER EXTREMITY Left 11/14/2020   Procedure: ABDOMINAL AORTOGRAM W/LOWER EXTREMITY;  Surgeon: [name], MD;  Location: [place] [facility];  Service: Cardiovascular;  Laterality: Left;   ABDOMINAL AORTOGRAM W/LOWER EXTREMITY N/A 01/02/2021   Procedure: ABDOMINAL AORTOGRAM W/LOWER EXTREMITY;  Surgeon: [name], MD;  Location: [place] [facility];  Service: Cardiovascular;  Laterality: N/A;   AMPUTATION Left 03/20/2021   Procedure: Left foot 5th ray amputation;  Surgeon: [name], MD;  Location: [place];  Service: Orthopedics;  Laterality: Left;   BIV ICD INSERTION CRT-D N/A 04/30/2022   Procedure: BIV ICD INSERTION CRT-D;  Surgeon: Constance Haw, MD;  Location: [place] [facility];  Service: Cardiovascular;  Laterality: N/A;   CARDIAC CATHETERIZATION  01/2011   ischemic cardiomyopathy 30-35%, multivessel CAD (Dr. [name])    CARDIAC CATHETERIZATION  09/13/2015   Procedure: Right/Left Heart Cath and Coronary/Graft Angiography;  Surgeon: [name], MD;  Location: [facility];  Service: Cardiovascular;;   CARDIOVERSION N/A 05/16/2019   Procedure: CARDIOVERSION;  Surgeon: [name], MD;  Location: [place];  Service: Cardiovascular;  Laterality: N/A;   Colonscopy     CORONARY ARTERY BYPASS GRAFT  08/03/2011   Procedure: CORONARY ARTERY BYPASS GRAFTING (CABG);  Surgeon: [name], MD;  Location: [facility];  Service: Open Heart Surgery;  Laterality: N/A;  CABG times three using  right saphenous vein and left mammary artery usisng endoscope.   LEFT HEART CATH AND CORS/GRAFTS ANGIOGRAPHY N/A 12/15/2021   Procedure: LEFT HEART CATH AND CORS/GRAFTS ANGIOGRAPHY;  Surgeon: [name], MD;  Location: [place] [facility];  Service: Cardiovascular;  Laterality: N/A;   LEFT HEART CATHETERIZATION WITH CORONARY/GRAFT ANGIOGRAM N/A 09/20/2013   Procedure: LEFT HEART CATHETERIZATION WITH [name];  Surgeon: [name], MD;  Location: [facility];  Service: Cardiovascular;  Laterality: N/A;   Lower Extremity Arterial Doppler  03/16/2013   bilat ABIs demonstated normal values; R runoff - posterior tibial & anterior tibial arteries occluded; L runoff - peroneal & posterior tibial arteries occluded, anterior tibial artery appears occluded   LUMBAR LAMINECTOMY/DECOMPRESSION MICRODISCECTOMY N/A 09/12/2014   Procedure: LUMBAR DECOMPRESSION L3-L4, L4-L5, MICRODISCECTOMY L4-L5;  Surgeon: [name], MD;  Location: [facility];  Service: Orthopedics;  Laterality: N/A;   PERIPHERAL VASCULAR BALLOON ANGIOPLASTY  11/14/2020   Procedure: PERIPHERAL VASCULAR BALLOON ANGIOPLASTY;  Surgeon: [name], MD;  Location: [place] [facility];  Service: Cardiovascular;;   PERIPHERAL VASCULAR BALLOON ANGIOPLASTY Left 01/02/2021   Procedure: PERIPHERAL VASCULAR BALLOON ANGIOPLASTY;  Surgeon: [name], MD;  Location: [place] [facility];  Service: Cardiovascular;  Laterality: Left;  Failed PTA   Pilonidal Cyst removed     TRANSTHORACIC ECHOCARDIOGRAM  11/10/2012   EF 40-45%, mild LVH, mild hypokinesis of anteroseptal myocardium, grade 1 diastolic dysfunction; mild MR & calcifed mitral annulus; LA mild-mod dilated; RA mildly dilated     Current Outpatient Medications  Medication Sig Dispense Refill   acetaminophen (TYLENOL) 500 MG tablet Take 500-1,000 mg by mouth every 6 (six) hours as needed (for pain.).     aspirin EC 81 MG tablet Take 81 mg by mouth at bedtime. Swallow  whole.     carvedilol (COREG) 6.25 MG tablet Take 1 tablet by mouth twice daily (Patient taking differently: Take 3.125 mg by mouth 2 (two) times daily.) 180 tablet 3   ELIQUIS 5 MG TABS tablet Take 1 tablet by mouth twice daily 180 tablet 1   empagliflozin (JARDIANCE) 10 MG TABS tablet Take 1 tablet (10 mg total) by mouth daily before breakfast. 30 tablet 11   furosemide (LASIX) 40 MG tablet Take 40 mg by mouth in the morning and at bedtime.     gabapentin (NEURONTIN) 300 MG capsule Take 600 mg by mouth at bedtime.     glipiZIDE (GLUCOTROL XL) 10 MG 24 hr tablet Take 10 mg by mouth at bedtime.     metFORMIN (GLUCOPHAGE-XR) 500 MG 24 hr tablet Take 1,000 mg by mouth 2 (two) times daily.     ONETOUCH VERIO test strip 1 each daily.     rosuvastatin (CRESTOR) 20 MG tablet Take 10 mg by mouth every other day.     neomycin-bacitracin-polymyxin (NEOSPORIN) [phone] ointment Apply 1 Application topically daily. (Patient not taking: Reported on 08/07/2022)     No current facility-administered medications for this visit.    Allergies:   Entresto [sacubitril-valsartan] and Sacubitril   Social History:  The patient  reports that he quit smoking about 10 years ago. His smoking use included cigars. He quit smokeless tobacco use about 10 years ago. He reports current alcohol use of about 1.0 - 2.0 standard drink of alcohol per week. He reports that he does not use drugs.   Family History:  The patient's family history includes Anesthesia problems in his daughter; Cancer in his mother; Colon cancer in his sister; Colon polyps in his father; Lung disease in his father; Sudden death in his maternal grandmother.   ROS:  Please see the history of present illness.   Otherwise, review of systems is positive for none.   All other systems are reviewed and negative.   PHYSICAL EXAM: VS:  BP 116/60   Pulse 67   Ht 6' 2"$  (1.88 m)   Wt 224 lb 12.8 oz (102 kg)   SpO2 99%   BMI 28.86 kg/m  , BMI Body mass index is  28.86 kg/m. GEN: Well nourished, well developed, in no acute distress  HEENT: normal  Neck: no JVD, carotid bruits, or masses Cardiac: RRR; no murmurs, rubs, or gallops,no edema  Respiratory:  clear to auscultation bilaterally, normal work of breathing GI: soft, nontender, nondistended, + BS MS: no deformity or atrophy  Skin: warm and dry, device site well healed Neuro:  Strength and sensation are intact Psych: euthymic mood, full affect  EKG:  EKG is ordered today. Personal review of the ekg ordered shows, ventricular paced, PVC  Personal review of the device interrogation today. Results in [place]: 11/17/2021: ALT 13 12/09/2021: BNP 482.4 04/27/2022: BUN 23; Creatinine, Ser 1.29; Hemoglobin 12.9; Platelets 219; Potassium 4.4; Sodium 142    Lipid Panel     Component Value Date/Time   CHOL 98 (L) 12/10/2021 0832   TRIG 88 12/10/2021 0832   HDL 35 (L) 12/10/2021 0832   CHOLHDL 2.8 12/10/2021 0832   LDLCALC 46 12/10/2021 0832     Wt Readings from Last 3 Encounters:  08/07/22 224 lb 12.8 oz (102 kg)  04/30/22 213 lb (96.6 kg)  04/27/22 213 lb (96.6 kg)      Other studies Reviewed: Additional studies/ records that were reviewed today include: LHC 12/15/21  Review of the above records today demonstrates:  1.  Severe native coronary artery disease with patency of the left main, total occlusion of the LAD just after the first diagonal, multiple severe calcific stenoses throughout the entirety of the circumflex distribution, and moderate stenoses in the RCA 2.  Status post CABG with continued patency of the LIMA to LAD, and interval occlusion of the SVG to PDA and SVG to PLA 3.  Normal LVEDP  Cardiac MRI 01/20/2022 1. Severe LV dilatation with severe systolic dysfunction (EF 0000000). Thinning/akinesis of inferior and lateral walls.   2.  Mild RV dilatation with mild systolic dysfunction (EF [id number])   3. While no late gadolinium enhancement is seen, there is  severe thinning (less than 4.36m) in the apical to basal lateral wall and apical inferior wall, suggesting nonviability in these regions.   4.  Moderate mitral regurgitation (regurgitant fraction 27%)   5.  Moderate tricuspid regurgitation (regurgitant fraction 25%)  ASSESSMENT AND PLAN:  1.  Chronic systolic heart failure: Due to ischemic cardiomyopathy.  Currently on optimal medical therapy per primary cardiology.  Did not tolerate Entresto due to dizziness.  Is now status post Medtronic CRT-D implanted 03/30/2022.  Device functioning appropriately.  No changes.  2.  Coronary disease: Status post CABG.  No current chest pain.  Plan per primary cardiology  3.  Hypertension: well controlled  4.  Hyperlipidemia: Continue statin per primary cardiology   Current medicines are reviewed at length with the patient today.   The patient does not have concerns regarding his medicines.  The following changes were made today:  none  Labs/ tests ordered today include:  Orders Placed This Encounter  Procedures   EKG 12-Lead     Disposition:   FU 12 months  Signed, Lakiya Cottam Meredith Leeds, MD  08/07/2022 2:24 PM     [address] [place] [id number] [phone] (office) [phone] (fax)

## 2022-08-20 ENCOUNTER — Ambulatory Visit: Payer: Medicare Other | Attending: Internal Medicine | Admitting: Internal Medicine

## 2022-08-20 ENCOUNTER — Encounter: Payer: Self-pay | Admitting: Internal Medicine

## 2022-08-20 VITALS — BP 104/63 | HR 62 | Ht 74.0 in | Wt 225.6 lb

## 2022-08-20 DIAGNOSIS — I255 Ischemic cardiomyopathy: Secondary | ICD-10-CM | POA: Diagnosis not present

## 2022-08-20 DIAGNOSIS — Z951 Presence of aortocoronary bypass graft: Secondary | ICD-10-CM | POA: Diagnosis not present

## 2022-08-20 DIAGNOSIS — I5022 Chronic systolic (congestive) heart failure: Secondary | ICD-10-CM | POA: Insufficient documentation

## 2022-08-20 DIAGNOSIS — Z79899 Other long term (current) drug therapy: Secondary | ICD-10-CM | POA: Insufficient documentation

## 2022-08-20 MED ORDER — EMPAGLIFLOZIN 25 MG PO TABS: 25.0000 mg | ORAL_TABLET | Freq: Every day | ORAL | 3 refills | Status: DC

## 2022-08-20 MED ORDER — CARVEDILOL 3.125 MG PO TABS: 3.1250 mg | ORAL_TABLET | Freq: Two times a day (BID) | ORAL | 3 refills | Status: DC

## 2022-08-20 NOTE — Progress Notes (Signed)
OFFICE NOTE  Chief Complaint:  Follow-up CRT-D  Primary Care Physician: [name] [name], MD  HPI:  [name] [name] is a pleasant 77 year old gentleman previously followed by Dr. Rollene Fare. His past medical history is significant for coronary artery disease. In 2013 he underwent multivessel CABG for an ischemic cardiomyopathy. EF was 20-25% prior to surgery however post surgery his EF had improved up to 45-50%. Recently his EF was 40-45% by echo in May of 2014. There was mild mitral regurgitation, mild to moderately dilated left atrium and mild LVH. [name] [name] has been describing some anxiety. He also reports some pain in his legs however underwent Dopplers in September 2014 which showed preserved ABIs bilaterally a 1.0 on the right and 1.1 on the left. The bilateral peroneal and posterior tibial arteries were occluded. He reports some optimal control of his diabetes. His A1c was 7.2. He is concerned about the cost of taking Tradjenta. He is followed by Dr. Elyse Hsu.   He was recently seen in the emergency room and he can hospital. There he presented with hypotension and was given 1 L normal saline and his symptoms improved. He had been having dizziness as well as some upper back pain into the left shoulder blade with exertion recently. The symptoms are worse particularly when climbing up ladders or going up stairs and are relieved at rest. After that hospitalization it was recommended that he discontinue his ACE inhibitor and beta blocker, and his blood pressure appears improved today up to 110/60.  [name] [name] returns today for followup of his stress test. This is interpreted as nonischemic with an EF of 44%. He reports still feeling very fatigued and extremely short of breath when doing activities. He says that the other day he tried to hit golf balls and felt that he was totally exhausted after just an hour. He reports his blood pressure has improved somewhat off of all medications however remains  low. He has had symptoms of orthostatic hypotension in the past. His symptoms do feel similar to prior to his bypass surgery. He also feels that he was much better after surgery than he is now and that his symptoms have been going on for the past 2 months.  At his last office visit, I recommended that [name] [name] have a repeat cardiac catheterization (08/2013). This demonstrated the following:  Impression:  1. 2 vessel native CAD with occluded LAD in the mid-vessel  2. Patent LIMA-LAD, SVG to PLA and SVG to PDA grafts with TIMI III flow  3. LVEDP = 13 mmHg  4. Random PVC's were noted  Based on these findings, I could not find any new cardiac cause of his symptoms. I suspect that some of his fatigue could be related to labile blood sugars. He says unexplained hypotension but is normotensive off of medications.  [name] [name] returns today for followup. He now reports feeling better since he had change in his medications. He is established with Dr. Buddy Duty who adjusted his diabetes medicines and stopped his Invokana. His shortness of breath and fatigue in both improved. He is now been expected some problems with left heel pain. He was found to have some spinal stenosis but has had improvement with inserts in his shoes.  I saw [name] [name] back in the office today. He has successfully underwent back surgery earlier this year which she says initially was much improved, however he says he subsequently developed more pain going down his legs. This sounds like it's neuropathic pain, but  is reluctant to try medication for it. He also significantly fatigue. This could be due to a number of etiologies. 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 Also, he is noted to have a history of obstructive sleep apnea. He was placed on BiPAP, but has not been compliant with that due to difficulty wearing the mask. He also never  had follow-up of his sleep study.  [name] [name] returns today for follow-up. He reports he's had worsening dyspnea and fatigue. He says that he can hardly sleep at night. He is struggling with significant anxiety. 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 He's not been wearing the BiPAP due to significant anxiety and difficulty sleeping. He feels like he gets smothered with his machine. Communication between Dr. Radford Pax and Dr. Quintin Alto led to the starting of Celexa as well as Klonopin to use as needed at night. He says that it does give him about 6 hours of sleep. He has not been using his BiPAP as instructed. He is reportedly had more worsening shortness of breath. He was seen in the ER for this and it was thought this was due [email or web address] with BiPAP. His BNP however is elevated over 300. Today in the office he says that he's had more swelling and more abdominal fullness as well as leg edema.  I had the pleasure seeing [name] [name] back today in the office. He seems to respond nicely to Lasix. I started him on 40 mg daily and while I was out of town my partner reviewed his lab work which included an elevated BNP over 300. He increased his Lasix to 40 mg twice a day. [name] [name] says he's had a significant improvement in his breathing. The BNP now is in the mid 200s. Renal function is stable based on labs today. Unfortunately, his echocardiogram does show a new cardiomyopathy with EF 30-35%. It should be noted that he felt "awful" on blood pressure/heart failure medicines and had taken himself off of ACE inhibitor and beta blocker in the past. The echo however does suggest inferior and anterolateral wall motion abnormalities which are new and could indicate graft dysfunction. I performed his last heart catheterization in 2015 which showed all 3 grafts patent.  [name] [name] returns today for follow-up of his left heart catheterization. I perform this a  few weeks ago and he had patent bypass grafts however LV function is reduced. Cardiac output is also reduced. Filling pressures were mildly elevated. He reports since discharge that he's had some improvement of his symptoms. He was started on low-dose beta blocker but he does report fatigue. When I asked him how they compared to previously he said he thinks attack sleep better since he started on the beta blocker but not back to what he thinks his baseline should be. He continues to have problems with left leg pain. He had ultrasound of the left leg which were unrevealing. He also had nerve conduction studies which I sent him for which were not suggestive of neuropathy. His symptoms were worse while lying flat on the Cath Lab table and I suspect this could be again coming from his back and he may need repeat orthopedic evaluation. His primary care provider started him on Celexa recently as well for anxiety.  12/23/2015  [name] [name] was seen back in the office today in follow-up.  Overall he seems to be doing well. He's tolerating low-dose carvedilol and has had some very infrequent morning dizziness. He was previously on Lasix 40 mg twice a day but ran out of the medicine about 8 days ago. He's not had any worsening weight gain or swelling nor worsening shortness of breath over the past week. This could be that he's had some improvement in his cardiomyopathy. Nevertheless, I would like him to be on some Lasix at least until we can determine whether or not he is euvolemic with lab work.  05/27/2016  [name] [name] returns today for follow-up. He's gained about 6 pounds of weight since I last saw him. He denies any worsening shortness of breath or orthostatic dizziness. He saw Dr. Radford Pax in August for following of his obstructive sleep apnea. He remains physically active and denies any chest pain. He recently had a repeat echo in October 2017 which showed a small improvement in LV function with EF up to 35-40%. Heart  failure regimen includes aspirin, carvedilol, Crestor, and spironolactone. He is not currently on a ARB is he's had orthostatic symptoms in the past although that his generally resolved. His creatinine is normal.  08/24/2016  I saw [name] [name] today in follow-up. He again gained about 4 pounds over the past month. He felt that the weight gain was heart failure related because he got more short of breath particularly walking up stairs. Based on that he increased his Lasix back to 40 mg daily. He notes that his urine is been darker and his urination is not necessarily improved. He's also been more dizzy. He says he gets somewhat presyncopal with change in position. Lab work 12 days ago indicated a stable creatinine however. Despite this, I feel that he may be somewhat presyncopal perhaps on too high of a dose of diuretics. He is also on Entresto 24/26 and Aldactone 12.5 mg daily. He also complained of some pain across the back between the shoulder blades. He said this got better with resting and drinking water. It's difficult to say whether that it was the water or perhaps resting from his activities. I cannot exclude that this could be angina. His EKG however is reassuring today.  09/21/2016  [name] [name] was seen today in follow-up. I decreased his Lasix, however he does not feel any change in his dizziness. There is no evidence of presyncope. He has gained about 4 pounds which I suspect is some fluid retention. Blood pressure is well-controlled today 118/56.  02/26/2017  [name] [name] returns today for follow-up. He had more recent dizziness and it seemed to worsen on Entresto. He discontinued that and feels that he's now much better. Overall he says he feels well. He is not on ACE inhibitor or ARB but remains on carvedilol. Is also on Lasix 40 mg daily. Blood pressure is stable 122/52.  08/10/2017  [name] [name] returns for follow-up.  Overall he seems to be feeling very well.  Denies any chest pain or  worsening shortness of breath.  He gets occasional dizziness but that is much improved.  Of note an EKG was performed again today and there is a suggestion that there may be underlying atrial flutter.  Heart rate was 74 and EKG looks very similar to his prior EKG.  The computer has interpreted a sinus rhythm with first-degree AV block and bifascicular block.  I compared EKG to his previous EKG last August which looks similar however it is distinctly different from the EKG last of February.  He is asymptomatic, but not anticoagulated.  Otherwise, labs reviewed from October indicate total cholesterol 134, HDL 36 LDL 67 and triglycerides 157.  Hemoglobin A1c of 7.6 and serum creatinine 0.9.  08/30/2017  [name] [name] returns today for follow-up of his studies.  He underwent an echocardiogram to evaluate for change in LVEF, but more importantly to rule out atrial flutter.  His EKG was abnormal and suggested possible atrial flutter, however he was asymptomatic.  The echo did not show any evidence of atrial flutter.  LVEF was stable at 40%.  He also underwent a home 48-hour Holter monitor.  This demonstrated sinus rhythm with PACs and PVCs, but no evidence of atrial fibrillation or flutter.  Remains asymptomatic.  His only other complaint today is chronic leg pain.  He was seen by Dr. Gwenlyn Found and evaluated with lower extremity arterial Dopplers.  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  A referral to neurosurgery was suggested but not placed.  01/24/2018  [name] [name] returns today for follow-up.  Overall he says he is doing really well.  He underwent 2 back injections and has had improvement in his back pain and leg pain.  He had an episode in June where he became somewhat dehydrated.  He presented to the ER with orthostatic hypotension.  His Lasix was cut back to 20 mg daily.  Weight is stayed stable effect is been improved from 252-246.  He says he is actually had enough energy  to play golf recently.  08/01/2018  [name] [name]. Patrizio seen today in routine follow-up.  He has some occasional dizziness which is persistent.  He says he gets a little lightheaded when he urinates.  He has been off of tamsulosin with no specific benefit and now is taking the medication every third day.  Blood pressure is stable.  Weight is stable.  He denies any chest pain or worsening shortness of breath with exertion.  His last echo showed an EF of 40% which is stable if not slightly improved from an echo in 2017.  Unfortunately he could not tolerate Entresto and remains on carvedilol.  He endorses NYHA class I-II heart failure symptoms.  Recent lab work showed total cholesterol 151, HDL 39, LDL 79 triglycerides 166.  Hemoglobin A1c of 7.3.  03/30/2019  [name] [name]. Bratz seen today in follow-up.  Overall he is doing well and has no complaints.  He has not recently struggled with any of the dizziness he has had previously.  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  In the past he has had EKG changes that were questionable for possible atrial flutter however monitoring as well as an echocardiogram failed to show any evidence of atrial flutter or fibrillation.  This is now clear finding of atrial fibrillation on EKG with an irregularly irregular rhythm with no evidence of P waves.  He reports being asymptomatic with this.  05/12/2019   [name] [name]. Helmkamp returns today for follow-up of his A. fib.  He reports he has been compliant with Eliquis.  He says he had a couple episodes of loose stools.  He thought it might be due to the medicine but this seems quite atypical.  He stopped some of his stool softener/bulking agents.  Weight is down about 3 pounds.  He increased his Lasix because he felt like it was a little wheezy.  Possibly could have had some more heart failure because he is in A. fib and  does have a history of heart failure.  EKG again shows atrial  flutter/fibrillation with variable AV response at 74.  Since this is presumably persistent at this point we discussed an elective cardioversion and he is agreeable to this.  06/13/2019  [name] [name]. Miske was seen today in follow-up.  He underwent cardioversion by myself in November.  Since then he has felt fairly well.  His EKG today shows that he is maintaining sinus rhythm.  Blood pressure was 119/63 however he has had some positional dizziness at home.  His daughter notes he has had some blood pressures with low diastolics less than 50 and he does feel some dizziness.  He also feels some fatigue when exerting himself a lot.  I suspect this could be due to hypotension.  Is possible also that his EF has improved now that he is back in sinus and that he may be over diuresed being on both Lasix and Aldactone.  09/15/2019  RandallBrandow returns for follow-up.  He reports improvement in his orthostatic symptoms after stopping Aldactone and his PCP also stopped tamsulosin.  He is now on some saw palmetto for BPH.  Unfortunately he does get a little bit more short of breath and I wonder if this is due to need for more diuretic.  His LVEF was 40% last year however has not been reassessed.  I would like to get an echo to recheck that.  03/25/2020  [name] [name]. Cheng is seen today for 54-monthfollow-up.  He denies any further orthostatic symptoms.  He was not feeling well on Aldactone.  He was also taken off tamsulosin.  He is only on carvedilol for heart failure and unfortunately his LVEF declined further in March down to 5 to 40%, previously 40 to 45%.  I had increased his Lasix due to signs of increased LV filling pressure however then he became a little bit prerenal.  His Lasix was then decreased to 40 mg every morning and 20 mg every afternoon.  Since then he has been asymptomatic with heart failure at most with NYHA class II symptoms.  Is been struggling with issues with the CPAP and that the humidifier broke.  This is a  [employer] device and he was told by the company that they are no longer manufacturing them rather they are making ventilators because of COVID-19.  Lipid profile in June 2021 showed total cholesterol 168, HDL 32, triglycerides 106 and 116.  A1c was 7.7 and both remain above goal.  09/20/2020  [name] [name]. TKunselmanreturns today for follow-up.  Overall he seems to be doing pretty well.  He struggled with a small wound under his left fifth metatarsal.  He is going to the wound center on April 1.  EF had declined somewhat down to 40 to 45%.  Unfortunately has been intolerant of Entresto and losartan more recently causing worsening fatigue.  His options for heart failure medications have been limited.  He is a diabetic with recent A1c at 6.8.  We discussed today the possibility of adding an SGLT2 inhibitor based on positive data in heart failure and diabetes.  01/01/2021  [name] [name]. TMowattis seen today in follow-up.  He seems somewhat desponded today.  He says he has a lot going on and and unfortunately has been dealing with wound that is nonhealing in his left leg.  He said that even working with the wound center he has developed osteomyelitis.  There talk about possible amputation.  He has been noted to have some mild reduction  in blood flow to that leg.  He is scheduled to actually have a retrograde angiogram tomorrow with [name].  He is also going to see Dr. Baxter Flattery with infectious diseases.  Hopefully they will be able to get control of this before having to consider an amputation.  He does say that he feels better though.  He had come off of alfuzosin which she was taking by his urologist and he now reports being less dizzy.  Also his blood sugars are significantly lower after starting the Jardiance.  08/14/2021  [name] [name]. Zoll returns today for follow-up.  He seems to be doing much better.  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  He still struggles with some balance issues but is done better.  He had 1 episode where he became dizzy or lightheaded in January when he was playing golf in [place].  This seemed to resolve with rest and drinking some water.  He has done well with the Jardiance.  11/17/2021  [name] [name]. Quillan is seen today as an acute add-on for shortness of breath.  He reports several weeks of worsening shortness of breath and difficulty breathing.  This past weekend it was difficult for him to sleep.  He called and noted that he increased his Lasix from 40 to 60 mg daily.  I advised him to stay on that because of his meeting today.  He reports over the past couple days he has started to diurese a little more.  He is swelling has improved but his breathing is still somewhat labored and he is fatigued.  Blood pressure was normal today.  Oxygen at saturation 96%.  He had repeat lipids in March, which showed a total cholesterol 172, triglycerides 86, HDL 38 and LDL lower at 118.  EKG today shows normal sinus rhythm.  01/21/2022  [name] [name]. Barger returns today for follow-up.  He is feeling much better after treatment of his heart failure.  He is down about 30 pounds of fluid.  He is currently on twice daily Lasix.  His echo had shown an LVEF of 30 to 35%.  Subsequently underwent heart catheterization by Dr. Burt Knack on 12/15/2021 which showed severe native coronary artery disease with patent left main and total occlusion of the LAD after the first diagonal.  The LIMA to LAD was patent.  There was interval occlusion of the SVG to PDA and SVG to PLA.  LVEDP was normal.  Given the fact that he had akinesis of the inferior and posterior walls and lateral walls on echo, the decision was made not to consider intervention of the native left circumflex.  We then pursued viability evaluation with cardiac MRI performed on 01/19/2022, this demonstrated severe LV dilatation with severe systolic dysfunction and LVEF  28%.  There was thinning and akinesis of the inferior and lateral walls.  The RV was mildly dilated with mild systolic dysfunction and RV EF 40%.  No LGE was noted however certain severe thinning of the apical and basal lateral wall and apical inferior wall suggested nonviability.  He was noted to have moderate mitral and tricuspid regurgitation.  08/20/2022  [name] [name]. Salk returns today for follow-up.  He underwent CRT-D implantation back in November with Dr. Curt Bears.  This was successful and subsequently he reported improvement in his heart failure symptoms consistent with possible responder status.  He was just seen in  follow-up a couple of weeks ago and was felt to be doing well.  He has had some recent weight gain.  Probably 7 to 10 pounds over the past 3 to 4 months.  He reports some lower extremity edema primarily in his ankles.  He says that he is felt much less dizzy after cutting back on his beta-blocker.  PMHx:  Past Medical History:  Diagnosis Date   Arthritis    Knee L - probably   Atrial fibrillation (HCC)    BPH (benign prostatic hyperplasia)    CAD (coronary artery disease) 07/06/2011   CHF (congestive heart failure) (HCC)    Coronary artery disease    Diabetes mellitus    Frequency of urination    Heart failure (HCC)    High cholesterol    History of nuclear stress test 09/2010   dipyridamole; mild perfusion defect due to attenuation with mild superimposed ischemia in apical septal, apical, apical inferior & apical lateral regions; rest LV enlarged in size; prominent gut uptake in infero-apical region; no significant ischemia demonstrated; low risk scan    HNP (herniated nucleus pulposus), lumbar    Hypertension    Ischemic cardiomyopathy    LV dysfunction 07/06/2011   Nocturia    Right bundle branch block    S/P CABG (coronary artery bypass graft) 08/03/2011   x3; LIMA to LAD,, SVG to PDA, SVG to posterolateral branch of RCA; Dr. Ellison Hughs   Sleep apnea    sleep study  10/2010- AHI during total sleep 32.1/hr and during REM 62.3/hr (severe sleep apnea)unable to tolerate c pap   Spinal stenosis of lumbar region    Stroke [facility])    [place]     Past Surgical History:  Procedure Laterality Date   ABDOMINAL AORTOGRAM W/LOWER EXTREMITY Left 11/14/2020   Procedure: ABDOMINAL AORTOGRAM W/LOWER EXTREMITY;  Surgeon: [name], MD;  Location: [place] [facility];  Service: Cardiovascular;  Laterality: Left;   ABDOMINAL AORTOGRAM W/LOWER EXTREMITY N/A 01/02/2021   Procedure: ABDOMINAL AORTOGRAM W/LOWER EXTREMITY;  Surgeon: [name], MD;  Location: [place] [facility];  Service: Cardiovascular;  Laterality: N/A;   AMPUTATION Left 03/20/2021   Procedure: Left foot 5th ray amputation;  Surgeon: [name], MD;  Location: [place];  Service: Orthopedics;  Laterality: Left;   BIV ICD INSERTION CRT-D N/A 04/30/2022   Procedure: BIV ICD INSERTION CRT-D;  Surgeon: Constance Haw, MD;  Location: [place] [facility];  Service: Cardiovascular;  Laterality: N/A;   CARDIAC CATHETERIZATION  01/2011   ischemic cardiomyopathy 30-35%, multivessel CAD (Dr. [name])    CARDIAC CATHETERIZATION  09/13/2015   Procedure: Right/Left Heart Cath and Coronary/Graft Angiography;  Surgeon: [name], MD;  Location: [place] [facility];  Service: Cardiovascular;;   CARDIOVERSION N/A 05/16/2019   Procedure: CARDIOVERSION;  Surgeon: [name], MD;  Location: [place];  Service: Cardiovascular;  Laterality: N/A;   Colonscopy     CORONARY ARTERY BYPASS GRAFT  08/03/2011   Procedure: CORONARY ARTERY BYPASS GRAFTING (CABG);  Surgeon: [name], MD;  Location: [place];  Service: Open Heart Surgery;  Laterality: N/A;  CABG times three using right saphenous vein and left mammary artery usisng endoscope.   LEFT HEART CATH AND CORS/GRAFTS ANGIOGRAPHY N/A 12/15/2021   Procedure: LEFT HEART CATH AND CORS/GRAFTS ANGIOGRAPHY;  Surgeon: [name], MD;  Location: [place] [facility];  Service: Cardiovascular;  Laterality: N/A;   LEFT HEART CATHETERIZATION WITH CORONARY/GRAFT ANGIOGRAM N/A 09/20/2013  Procedure: LEFT HEART CATHETERIZATION WITH [name];  Surgeon: [name], MD;  Location: [facility] CATH LAB;  Service: Cardiovascular;  Laterality: N/A;   Lower Extremity Arterial Doppler  03/16/2013   bilat ABIs demonstated normal values; R runoff - posterior tibial & anterior tibial arteries occluded; L runoff - peroneal & posterior tibial arteries occluded, anterior tibial artery appears occluded   LUMBAR LAMINECTOMY/DECOMPRESSION MICRODISCECTOMY N/A 09/12/2014   Procedure: LUMBAR DECOMPRESSION L3-L4, L4-L5, MICRODISCECTOMY L4-L5;  Surgeon: [name], MD;  Location: [facility];  Service: Orthopedics;  Laterality: N/A;   PERIPHERAL VASCULAR BALLOON ANGIOPLASTY  11/14/2020   Procedure: PERIPHERAL VASCULAR BALLOON ANGIOPLASTY;  Surgeon: [name], MD;  Location: [facility];  Service: Cardiovascular;;   PERIPHERAL VASCULAR BALLOON ANGIOPLASTY Left 01/02/2021   Procedure: PERIPHERAL VASCULAR BALLOON ANGIOPLASTY;  Surgeon: [name], MD;  Location: [facility];  Service: Cardiovascular;  Laterality: Left;  Failed PTA   Pilonidal Cyst removed     TRANSTHORACIC ECHOCARDIOGRAM  11/10/2012   EF 40-45%, mild LVH, mild hypokinesis of anteroseptal myocardium, grade 1 diastolic dysfunction; mild [name] [name] & calcifed mitral annulus; LA mild-mod dilated; RA mildly dilated    FAMHx:  Family History  Problem Relation Age of Onset   Cancer Mother    Lung disease Father        & heart disease   Colon polyps Father    Colon cancer Sister    Sudden death Maternal Grandmother    Anesthesia problems Daughter     SOCHx:   reports that he quit smoking about 10 years ago. His smoking use included cigars. He quit smokeless tobacco use about 10 years ago. He reports current alcohol use of about 1.0 - 2.0 standard drink of alcohol per week.  He reports that he does not use drugs.  ALLERGIES:  Allergies  Allergen Reactions   Entresto [Sacubitril-Valsartan] Other (See Comments)    dizziness   Sacubitril Other (See Comments)     Dizziness    ROS: Pertinent items noted in HPI and remainder of comprehensive ROS otherwise negative.  HOME MEDS: Current Outpatient Medications  Medication Sig Dispense Refill   acetaminophen (TYLENOL) 500 MG tablet Take 500-1,000 mg by mouth every 6 (six) hours as needed (for pain.).     carvedilol (COREG) 6.25 MG tablet Take 1 tablet by mouth twice daily (Patient taking differently: Take 3.125 mg by mouth 2 (two) times daily.) 180 tablet 3   ELIQUIS 5 MG TABS tablet Take 1 tablet by mouth twice daily 180 tablet 1   empagliflozin (JARDIANCE) 10 MG TABS tablet Take 1 tablet (10 mg total) by mouth daily before breakfast. 30 tablet 11   furosemide (LASIX) 40 MG tablet Take 40 mg by mouth in the morning and at bedtime.     gabapentin (NEURONTIN) 300 MG capsule Take 600 mg by mouth at bedtime.     glipiZIDE (GLUCOTROL XL) 10 MG 24 hr tablet Take 10 mg by mouth at bedtime.     metFORMIN (GLUCOPHAGE-XR) 500 MG 24 hr tablet Take 1,000 mg by mouth 2 (two) times daily.     ONETOUCH VERIO test strip 1 each daily.     rosuvastatin (CRESTOR) 20 MG tablet Take 10 mg by mouth every other day.     aspirin EC 81 MG tablet Take 81 mg by mouth at bedtime. Swallow whole.     No current facility-administered medications for this visit.    LABS/IMAGING: No results found for this or any previous  visit (from the past 48 hour(s)).  No results found.  VITALS: BP 104/63   Pulse 62   Ht 6' 2"$  (1.88 m)   Wt 225 lb 9.6 oz (102.3 kg)   SpO2 98%   BMI 28.97 kg/m   EXAM: General appearance: alert and no distress Neck: JVD - 5 cm above sternal notch, no carotid bruit, and thyroid not enlarged, symmetric, no tenderness/mass/nodules Lungs: dullness to percussion bibasilar Heart: regular rate and rhythm Abdomen:  soft, non-tender; bowel sounds normal; no masses,  no organomegaly Extremities: edema trace bilateral sock line Pulses: 2+ and symmetric Skin: Skin color, texture, turgor normal. No rashes or lesions Neurologic: Grossly normal Psych: Pleasant  EKG: Deferred  ASSESSMENT: Acute on chronic systolic congestive heart failure, LVEF 28% by cMRI (12/2021) -inferior and lateral wall thinning suggestive of nonviable myocardium, NYHA Class I-II symptoms, s/p Med[employer] CRT-D implant (04/2022) Orthostatic hypotension-resolved History of atrial fibrillation, CHADSVASC score of 5, status post DCCV ([id number]) Chronic systolic congestive heart failure - EF 40-45%% (2021) Coronary artery disease status post three-vessel CABG in 2013 (LIMA to LAD, SVG to PDA and SVG to PLA) -occluded SVG to PDA and SVG to PLA by cath 11/2021 Hypertension-controlled Dyslipidemia Diabetes type 2 - better controlled Mild PVD-with normal ABI Hypotension/upper back pain - resolved Spinal stenosis - s/p surgery Trifascicular block OSA-back on BiPAP  PLAN: 1.   [name] [name]. [place] underwent CRT-D implantation in November 2023.  Afterwards he says he has had improvement in his shortness of breath and fatigue.  He has had some weight gain now about 7 to 10 pounds over the past several months.  He does have some lower extremity edema.  I do feel that some of this is fluid.  He has had some orthostatic symptoms on higher dose diuretics.  I suggested that we might increase his Jardiance further from 10 up to 25 mg daily which may be additionally helpful.  He should monitor his fluid status and if his weight increases further up over 225 pounds with lower extremity swelling consider taking additional Lasix.  Will plan repeat metabolic profile and BNP in about 2 weeks.  Follow-up with me in about 6 months with a repeat echo to look for improvement in LV function.  [name], MD, [facility], [place] Director of  the [facility]  [facility] Diplomate of the American Board of Clinical Lipidology Attending Cardiologist  Direct Dial: [phone]  Fax: [phone]  Website:  [email or web address] 08/20/2022, 11:29 AM

## 2022-08-20 NOTE — Patient Instructions (Addendum)
Medication Instructions:  INCREASE Jardiance to 77m daily   CHANGE in tablet size of carvedilol -- new prescipriton is 3.1248mtablets - take 1 twice daily  *If you need a refill on your cardiac medications before your next appointment, please call your pharmacy*   Lab Work: Non-Fasting lab work in 2 weeks - BNP and BMET  If you have labs (blood work) drawn today and your tests are completely normal, you will receive your results only by: [place] you have MyChart) OR A paper copy in the mail If you have any lab test that is abnormal or we need to change your treatment, we will call you to review the results.   Testing/Procedures: Your physician has requested that you have an echocardiogram. Echocardiography is a painless test that uses sound waves to create images of your heart. It provides your doctor with information about the size and shape of your heart and how well your heart's chambers and valves are working. This procedure takes approximately one hour. There are no restrictions for this procedure. Please do NOT wear cologne, perfume, aftershave, or lotions (deodorant is allowed). Please arrive 15 minutes prior to your appointment time. DUE August 2024   Follow-Up: [facility].  As part of our continuing mission to provide you with exceptional heart care, we have created designated Provider Care Teams.  These Care Teams include your primary Cardiologist (physician) and Advanced Practice Providers (APPs -  Physician Assistants and Nurse Practitioners) who all work together to provide you with the care you need, when you need it.  We recommend signing up for the patient portal called "MyChart".  Sign up information is provided on this After Visit Summary.  MyChart is used to connect with patients for Virtual Visits (Telemedicine).  Patients are able to view lab/test results, encounter notes, upcoming appointments,  etc.  Non-urgent messages can be sent to your provider as well.   To learn more about what you can do with MyChart, go to [email or web address]   Your next appointment:    6 months with Dr. HiDebara Pickett- after echo

## 2022-09-01 ENCOUNTER — Telehealth: Payer: Self-pay

## 2022-09-01 NOTE — Telephone Encounter (Signed)
Referred to [facility] clinic by Dr Curt Bears.    Spoke with patient and ICM intro given.  Patient is agreeable to ICM monthly follow up.  Advised transmission will send automatically between 12 Midnight and 6:00 AM if monitor is by bedside.  Explained will call with results after transmission is reviewed.  Explained a Remote Home Transmission will be seen as daytime appointment on office summary visit sheet but the time is not relevant since all transmissions are sent at night time so there is no obligation to stay by the monitor at that time appointed time during the day.  Provided ICM direct number and explained should call if experiencing any fluid symptoms such as weight gain, shortness of breath or extremity/abdominal swelling.   Advised 08/31/2022 remote transmission showed possible fluid accumulation from 2/13-2/20 and he did have leg swelling during that time.  Leg swelling was addressed at 2/22 OV with Dr Debara Pickett and increased Jardiance to help resolve fluid accumulation.  Leg swelling has resolved and he is feeling fine now.   1st ICM remote transmission scheduled for 10/04/2021.

## 2022-09-01 NOTE — Telephone Encounter (Signed)
-----   Message from Stanton Kidney, RN sent at 08/07/2022  2:13 PM EST ----- Regarding: ICM Please establish pt with your ICM clinic  Thx :)

## 2022-09-07 DIAGNOSIS — Z79899 Other long term (current) drug therapy: Secondary | ICD-10-CM | POA: Diagnosis not present

## 2022-09-07 DIAGNOSIS — E119 Type 2 diabetes mellitus without complications: Secondary | ICD-10-CM | POA: Diagnosis not present

## 2022-09-07 DIAGNOSIS — I5022 Chronic systolic (congestive) heart failure: Secondary | ICD-10-CM | POA: Diagnosis not present

## 2022-09-07 DIAGNOSIS — R5383 Other fatigue: Secondary | ICD-10-CM | POA: Diagnosis not present

## 2022-09-07 DIAGNOSIS — I10 Essential (primary) hypertension: Secondary | ICD-10-CM | POA: Diagnosis not present

## 2022-09-07 DIAGNOSIS — N183 Chronic kidney disease, stage 3 unspecified: Secondary | ICD-10-CM | POA: Diagnosis not present

## 2022-09-07 DIAGNOSIS — E7849 Other hyperlipidemia: Secondary | ICD-10-CM | POA: Diagnosis not present

## 2022-09-07 DIAGNOSIS — I255 Ischemic cardiomyopathy: Secondary | ICD-10-CM | POA: Diagnosis not present

## 2022-09-07 DIAGNOSIS — D638 Anemia in other chronic diseases classified elsewhere: Secondary | ICD-10-CM | POA: Diagnosis not present

## 2022-09-08 LAB — BASIC METABOLIC PANEL
BUN/Creatinine Ratio: 20 (ref 10–24)
BUN: 20 mg/dL (ref 8–27)
CO2: 22 mmol/L (ref 20–29)
Calcium: 9.3 mg/dL (ref 8.6–10.2)
Chloride: 105 mmol/L (ref 96–106)
Creatinine, Ser: 0.98 mg/dL (ref 0.76–1.27)
Glucose: 152 mg/dL — ABNORMAL HIGH (ref 70–99)
Potassium: 4.2 mmol/L (ref 3.5–5.2)
Sodium: 144 mmol/L (ref 134–144)
eGFR: 79 mL/min/{1.73_m2} (ref >59)

## 2022-09-08 LAB — BRAIN NATRIURETIC PEPTIDE: BNP: 512.9 pg/mL — ABNORMAL HIGH (ref 0.0–100.0)

## 2022-09-14 DIAGNOSIS — I739 Peripheral vascular disease, unspecified: Secondary | ICD-10-CM | POA: Diagnosis not present

## 2022-09-14 DIAGNOSIS — E1165 Type 2 diabetes mellitus with hyperglycemia: Secondary | ICD-10-CM | POA: Diagnosis not present

## 2022-09-14 DIAGNOSIS — R42 Dizziness and giddiness: Secondary | ICD-10-CM | POA: Diagnosis not present

## 2022-09-14 DIAGNOSIS — Z0001 Encounter for general adult medical examination with abnormal findings: Secondary | ICD-10-CM | POA: Diagnosis not present

## 2022-09-14 DIAGNOSIS — Z23 Encounter for immunization: Secondary | ICD-10-CM | POA: Diagnosis not present

## 2022-09-14 DIAGNOSIS — I255 Ischemic cardiomyopathy: Secondary | ICD-10-CM | POA: Diagnosis not present

## 2022-09-14 DIAGNOSIS — E1122 Type 2 diabetes mellitus with diabetic chronic kidney disease: Secondary | ICD-10-CM | POA: Diagnosis not present

## 2022-09-14 DIAGNOSIS — R5383 Other fatigue: Secondary | ICD-10-CM | POA: Diagnosis not present

## 2022-09-14 DIAGNOSIS — E7849 Other hyperlipidemia: Secondary | ICD-10-CM | POA: Diagnosis not present

## 2022-09-14 DIAGNOSIS — D638 Anemia in other chronic diseases classified elsewhere: Secondary | ICD-10-CM | POA: Diagnosis not present

## 2022-09-14 DIAGNOSIS — I5033 Acute on chronic diastolic (congestive) heart failure: Secondary | ICD-10-CM | POA: Diagnosis not present

## 2022-09-14 DIAGNOSIS — I10 Essential (primary) hypertension: Secondary | ICD-10-CM | POA: Diagnosis not present

## 2022-09-21 ENCOUNTER — Telehealth: Payer: Self-pay

## 2022-09-21 DIAGNOSIS — I255 Ischemic cardiomyopathy: Secondary | ICD-10-CM

## 2022-09-21 DIAGNOSIS — Z79899 Other long term (current) drug therapy: Secondary | ICD-10-CM

## 2022-09-21 DIAGNOSIS — I5022 Chronic systolic (congestive) heart failure: Secondary | ICD-10-CM

## 2022-09-21 NOTE — Progress Notes (Signed)
Remote ICD transmission.   

## 2022-09-21 NOTE — Telephone Encounter (Signed)
Spoke with patient after receiving mychart message that patient wanted fluid levels check due to weight fluctuation.    SYMPTOMS: He reports minimal ankle swelling and a 10 lb weight gain within the last week but weight has lost 2-3 lbs.  His weight remains 7 lbs (221.5 lbs) over baseline weight (212-213 lbs).   He was on vacation last week which coincides with weight gain and ankle swelling.  He has not noticed any changes in his heart rhythm and denies SOB.    He is compliant with taking all his meds and has not missed any dosages.    Remote transmission received and reviewed.    Copy sent to Dr Debara Pickett and Dr Curt Bears for review and recommendations.    09/21/2022  Optivol thoracic impedance suggesting possible fluid accumulation starting 3/18.     Also noted: report suggesting pt may be out of NSR starting 3/11 and is ongoing.

## 2022-09-23 DIAGNOSIS — M6281 Muscle weakness (generalized): Secondary | ICD-10-CM | POA: Diagnosis not present

## 2022-09-23 DIAGNOSIS — R262 Difficulty in walking, not elsewhere classified: Secondary | ICD-10-CM | POA: Diagnosis not present

## 2022-09-23 NOTE — Telephone Encounter (Signed)
Janina Mayo, MD  You9 minutes ago (12:00 PM)   Based on his data, would recommend he double his lasix for 5 days and can get a BMET/BNP in 1 week. Please arrange for an APP visit in a week if feasible.

## 2022-09-23 NOTE — Telephone Encounter (Signed)
Spoke with patient. He is aware of recommendations from Beckie Busing MD. Lab order(s) mailed per his request. He will complete towards end of next week.   He asked about being out of rhythm, as Margarita Grizzle advised he may be. He said he saw his PCP on Monday and Dr. Quintin Alto did not check an EKG and did not mention he may be out of rhythm either.   Patient scheduled for follow up with [name] MD on 4/15 @ 1:30pm

## 2022-09-25 ENCOUNTER — Encounter: Payer: Self-pay | Admitting: Internal Medicine

## 2022-09-25 NOTE — Telephone Encounter (Signed)
I think he should go back to his usual diuretic dosing now, does not need to take it for another 3 days.

## 2022-09-27 DIAGNOSIS — E1121 Type 2 diabetes mellitus with diabetic nephropathy: Secondary | ICD-10-CM | POA: Diagnosis not present

## 2022-09-27 DIAGNOSIS — I10 Essential (primary) hypertension: Secondary | ICD-10-CM | POA: Diagnosis not present

## 2022-09-28 DIAGNOSIS — M6281 Muscle weakness (generalized): Secondary | ICD-10-CM | POA: Diagnosis not present

## 2022-09-28 DIAGNOSIS — R262 Difficulty in walking, not elsewhere classified: Secondary | ICD-10-CM | POA: Diagnosis not present

## 2022-10-01 DIAGNOSIS — R262 Difficulty in walking, not elsewhere classified: Secondary | ICD-10-CM | POA: Diagnosis not present

## 2022-10-01 DIAGNOSIS — M6281 Muscle weakness (generalized): Secondary | ICD-10-CM | POA: Diagnosis not present

## 2022-10-02 DIAGNOSIS — I255 Ischemic cardiomyopathy: Secondary | ICD-10-CM | POA: Diagnosis not present

## 2022-10-02 DIAGNOSIS — Z79899 Other long term (current) drug therapy: Secondary | ICD-10-CM | POA: Diagnosis not present

## 2022-10-02 DIAGNOSIS — I5022 Chronic systolic (congestive) heart failure: Secondary | ICD-10-CM | POA: Diagnosis not present

## 2022-10-03 LAB — BASIC METABOLIC PANEL
BUN/Creatinine Ratio: 17 (ref 10–24)
BUN: 19 mg/dL (ref 8–27)
CO2: 23 mmol/L (ref 20–29)
Calcium: 9.2 mg/dL (ref 8.6–10.2)
Chloride: 100 mmol/L (ref 96–106)
Creatinine, Ser: 1.13 mg/dL (ref 0.76–1.27)
Glucose: 155 mg/dL — ABNORMAL HIGH (ref 70–99)
Potassium: 4.3 mmol/L (ref 3.5–5.2)
Sodium: 141 mmol/L (ref 134–144)
eGFR: 67 mL/min/{1.73_m2} (ref >59)

## 2022-10-03 LAB — BRAIN NATRIURETIC PEPTIDE: BNP: 323.2 pg/mL — ABNORMAL HIGH (ref 0.0–100.0)

## 2022-10-05 ENCOUNTER — Ambulatory Visit: Payer: Medicare Other | Attending: Cardiology

## 2022-10-05 DIAGNOSIS — I5022 Chronic systolic (congestive) heart failure: Secondary | ICD-10-CM

## 2022-10-05 DIAGNOSIS — R262 Difficulty in walking, not elsewhere classified: Secondary | ICD-10-CM | POA: Diagnosis not present

## 2022-10-05 DIAGNOSIS — Z95810 Presence of automatic (implantable) cardiac defibrillator: Secondary | ICD-10-CM | POA: Diagnosis not present

## 2022-10-05 DIAGNOSIS — M6281 Muscle weakness (generalized): Secondary | ICD-10-CM | POA: Diagnosis not present

## 2022-10-06 NOTE — Progress Notes (Signed)
EPIC Encounter for ICM Monitoring  Patient Name: [name] [name] is a 77 y.o. male Date: 10/06/2022 Primary Care Physican: [name], MD Primary Cardiologist: [facility]  Electrophysiologist: [facility] Bi-V Pacing: 91.4%  09/21/2022 Weight: 213 lbs 10/06/2022 Weight: 219 lbs   Clinical Status Since 25-Mar-202 AT/AF  1 Time in AT/AF  24.0 hr/day (100.0%) Longest AT/AF  28 days Alert: AT/AF >= 0.5 hr for 15 days.       1st ICM remote transmission.  Heart Failure questions reviewed.  Pt reports weight gain of about 4 lbs over the last week.     Optivol thoracic impedance normal but was suggesting possible fluid accumulation from 3/18 to 3/23 (returned to normal after Furosemide was doubled for a day).   Prescribed:  Furosemide 40 mg take 1 tablet(s) (40 mg total) by mouth every morning and at bedtime.  Labs: 10/02/2022 Creatinine 1.13, BUN 19, Potassium 4.3, Sodium 141, GFR 67  09/07/2022 Creatinine 0.98, BUN 20, Potassium 4.2, Sodium 144, GFR 79  A complete set of results can be found in Results Review.  Recommendations: Pt self adjusted Furosemide and took extra tablet today, 4/9.  Advised to get approval from Dr [name] (at 4/15 OV) regarding self adjusting Lasix.   Follow-up plan: ICM clinic phone appointment on 11/09/2022.   91 day device clinic remote transmission 11/02/2022.    EP/Cardiology Office Visits: 10/12/2022 with Dr. [name].    Copy of ICM check sent to Dr. [name].   3 month ICM trend: 10/05/2022.    12-14 Month ICM trend:     Karie Soda, RN 10/06/2022 4:12 PM

## 2022-10-08 DIAGNOSIS — M6281 Muscle weakness (generalized): Secondary | ICD-10-CM | POA: Diagnosis not present

## 2022-10-08 DIAGNOSIS — R262 Difficulty in walking, not elsewhere classified: Secondary | ICD-10-CM | POA: Diagnosis not present

## 2022-10-12 ENCOUNTER — Encounter: Payer: Self-pay | Admitting: Internal Medicine

## 2022-10-12 ENCOUNTER — Ambulatory Visit: Payer: Medicare Other | Attending: Internal Medicine | Admitting: Internal Medicine

## 2022-10-12 VITALS — BP 120/64 | HR 65 | Ht 74.0 in | Wt 224.2 lb

## 2022-10-12 DIAGNOSIS — I5043 Acute on chronic combined systolic (congestive) and diastolic (congestive) heart failure: Secondary | ICD-10-CM | POA: Diagnosis not present

## 2022-10-12 DIAGNOSIS — I4891 Unspecified atrial fibrillation: Secondary | ICD-10-CM | POA: Insufficient documentation

## 2022-10-12 DIAGNOSIS — Z951 Presence of aortocoronary bypass graft: Secondary | ICD-10-CM | POA: Insufficient documentation

## 2022-10-12 DIAGNOSIS — Z95810 Presence of automatic (implantable) cardiac defibrillator: Secondary | ICD-10-CM | POA: Diagnosis not present

## 2022-10-12 NOTE — H&P (View-Only) (Signed)
 OFFICE NOTE  Chief Complaint:  Follow-up CHF  Primary Care Physician: [name], [name] W, MD  HPI:  [name] [name] is a pleasant 77-year-old gentleman previously followed by [name] [name]. His past medical history is significant for coronary artery disease. In 2013 he underwent multivessel CABG for an ischemic cardiomyopathy. EF was 20-25% prior to surgery however post surgery his EF had improved up to 45-50%. Recently his EF was 40-45% by echo in May of 2014. There was mild mitral regurgitation, mild to moderately dilated left atrium and mild LVH. [name] [name] has been describing some anxiety. He also reports some pain in his legs however underwent Dopplers in September 2014 which showed preserved ABIs bilaterally a 1.0 on the right and 1.1 on the left. The bilateral peroneal and posterior tibial arteries were occluded. He reports some optimal control of his diabetes. His A1c was 7.2. He is concerned about the cost of taking Tradjenta. He is followed by [name] [name].   He was recently seen in the emergency room and he can hospital. There he presented with hypotension and was given 1 L normal saline and his symptoms improved. He had been having dizziness as well as some upper back pain into the left shoulder blade with exertion recently. The symptoms are worse particularly when climbing up ladders or going up stairs and are relieved at rest. After that hospitalization it was recommended that he discontinue his ACE inhibitor and beta blocker, and his blood pressure appears improved today up to 110/60.  [name] [name] returns today for followup of his stress test. This is interpreted as nonischemic with an EF of 44%. He reports still feeling very fatigued and extremely short of breath when doing activities. He says that the other day he tried to hit golf balls and felt that he was totally exhausted after just an hour. He reports his blood pressure has improved somewhat off of all medications however remains  low. He has had symptoms of orthostatic hypotension in the past. His symptoms do feel similar to prior to his bypass surgery. He also feels that he was much better after surgery than he is now and that his symptoms have been going on for the past 2 months.  At his last office visit, I recommended that [name] [name] have a repeat cardiac catheterization (08/2013). This demonstrated the following:  Impression:  1. 2 vessel native CAD with occluded LAD in the mid-vessel  2. Patent LIMA-LAD, SVG to PLA and SVG to PDA grafts with TIMI III flow  3. LVEDP = 13 mmHg  4. Random PVC's were noted  Based on these findings, I could not find any new cardiac cause of his symptoms. I suspect that some of his fatigue could be related to labile blood sugars. He says unexplained hypotension but is normotensive off of medications.  [name] [name] returns today for followup. He now reports feeling better since he had change in his medications. He is established with [name] [name] who adjusted his diabetes medicines and stopped his Invokana. His shortness of breath and fatigue in both improved. He is now been expected some problems with left heel pain. He was found to have some spinal stenosis but has had improvement with inserts in his shoes.  I saw [name] [name] back in the office today. He has successfully underwent back surgery earlier this year which she says initially was much improved, however he says he subsequently developed more pain going down his legs. This sounds like it's neuropathic pain, but   is reluctant to try medication for it. He also significantly fatigue. This could be due to a number of etiologies. 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 Also, he is noted to have a history of obstructive sleep apnea. He was placed on BiPAP, but has not been compliant with that due to difficulty wearing the mask. He also never  had follow-up of his sleep study.  [name] [name] returns today for follow-up. He reports he's had worsening dyspnea and fatigue. He says that he can hardly sleep at night. He is struggling with significant anxiety. 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 He's not been wearing the BiPAP due to significant anxiety and difficulty sleeping. He feels like he gets smothered with his machine. Communication between [name] [name] and Dr. [name] led to the starting of Celexa as well as Klonopin to use as needed at night. He says that it does give him about 6 hours of sleep. He has not been using his BiPAP as instructed. He is reportedly had more worsening shortness of breath. He was seen in the ER for this and it was thought this was due to.compliance with BiPAP. His BNP however is elevated over 300. Today in the office he says that he's had more swelling and more abdominal fullness as well as leg edema.  I had the pleasure seeing [name] [name] back today in the office. He seems to respond nicely to Lasix. I started him on 40 mg daily and while I was out of town my partner reviewed his lab work which included an elevated BNP over 300. He increased his Lasix to 40 mg twice a day. [name] [name] says he's had a significant improvement in his breathing. The BNP now is in the mid 200s. Renal function is stable based on labs today. Unfortunately, his echocardiogram does show a new cardiomyopathy with EF 30-35%. It should be noted that he felt "awful" on blood pressure/heart failure medicines and had taken himself off of ACE inhibitor and beta blocker in the past. The echo however does suggest inferior and anterolateral wall motion abnormalities which are new and could indicate graft dysfunction. I performed his last heart catheterization in 2015 which showed all 3 grafts patent.  [name] [name] returns today for follow-up of his left heart catheterization. I perform this a  few weeks ago and he had patent bypass grafts however LV function is reduced. Cardiac output is also reduced. Filling pressures were mildly elevated. He reports since discharge that he's had some improvement of his symptoms. He was started on low-dose beta blocker but he does report fatigue. When I asked him how they compared to previously he said he thinks attack sleep better since he started on the beta blocker but not back to what he thinks his baseline should be. He continues to have problems with left leg pain. He had ultrasound of the left leg which were unrevealing. He also had nerve conduction studies which I sent him for which were not suggestive of neuropathy. His symptoms were worse while lying flat on the Cath Lab table and I suspect this could be again coming from his back and he may need repeat orthopedic evaluation. His primary care provider started him on Celexa recently as well for anxiety.  12/23/2015  [name] [name] was seen back in the office today in follow-up.   Overall he seems to be doing well. He's tolerating low-dose carvedilol and has had some very infrequent morning dizziness. He was previously on Lasix 40 mg twice a day but ran out of the medicine about 8 days ago. He's not had any worsening weight gain or swelling nor worsening shortness of breath over the past week. This could be that he's had some improvement in his cardiomyopathy. Nevertheless, I would like him to be on some Lasix at least until we can determine whether or not he is euvolemic with lab work.  05/27/2016  [name] [name] returns today for follow-up. He's gained about 6 pounds of weight since I last saw him. He denies any worsening shortness of breath or orthostatic dizziness. He saw [name] [name] in August for following of his obstructive sleep apnea. He remains physically active and denies any chest pain. He recently had a repeat echo in October 2017 which showed a small improvement in LV function with EF up to 35-40%. Heart  failure regimen includes aspirin, carvedilol, Crestor, and spironolactone. He is not currently on a ARB is he's had orthostatic symptoms in the past although that his generally resolved. His creatinine is normal.  08/24/2016  I saw [name] [name] today in follow-up. He again gained about 4 pounds over the past month. He felt that the weight gain was heart failure related because he got more short of breath particularly walking up stairs. Based on that he increased his Lasix back to 40 mg daily. He notes that his urine is been darker and his urination is not necessarily improved. He's also been more dizzy. He says he gets somewhat presyncopal with change in position. Lab work 12 days ago indicated a stable creatinine however. Despite this, I feel that he may be somewhat presyncopal perhaps on too high of a dose of diuretics. He is also on Entresto 24/26 and Aldactone 12.5 mg daily. He also complained of some pain across the back between the shoulder blades. He said this got better with resting and drinking water. It's difficult to say whether that it was the water or perhaps resting from his activities. I cannot exclude that this could be angina. His EKG however is reassuring today.  09/21/2016  [name] [name] was seen today in follow-up. I decreased his Lasix, however he does not feel any change in his dizziness. There is no evidence of presyncope. He has gained about 4 pounds which I suspect is some fluid retention. Blood pressure is well-controlled today 118/56.  02/26/2017  [name] [name] returns today for follow-up. He had more recent dizziness and it seemed to worsen on Entresto. He discontinued that and feels that he's now much better. Overall he says he feels well. He is not on ACE inhibitor or ARB but remains on carvedilol. Is also on Lasix 40 mg daily. Blood pressure is stable 122/52.  08/10/2017  [name] [name] returns for follow-up.  Overall he seems to be feeling very well.  Denies any chest pain or  worsening shortness of breath.  He gets occasional dizziness but that is much improved.  Of note an EKG was performed again today and there is a suggestion that there may be underlying atrial flutter.  Heart rate was 74 and EKG looks very similar to his prior EKG.  The computer has interpreted a sinus rhythm with first-degree AV block and bifascicular block.  I compared EKG to his previous EKG last August which looks similar however it is distinctly different from the EKG last of February.    He is asymptomatic, but not anticoagulated.  Otherwise, labs reviewed from October indicate total cholesterol 134, HDL 36 LDL 67 and triglycerides 157.  Hemoglobin A1c of 7.6 and serum creatinine 0.9.  08/30/2017  [name] [name] returns today for follow-up of his studies.  He underwent an echocardiogram to evaluate for change in LVEF, but more importantly to rule out atrial flutter.  His EKG was abnormal and suggested possible atrial flutter, however he was asymptomatic.  The echo did not show any evidence of atrial flutter.  LVEF was stable at 40%.  He also underwent a home 48-hour Holter monitor.  This demonstrated sinus rhythm with PACs and PVCs, but no evidence of atrial fibrillation or flutter.  Remains asymptomatic.  His only other complaint today is chronic leg pain.  He was seen by Dr. Berry and evaluated with lower extremity arterial Dopplers.  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  A referral to neurosurgery was suggested but not placed.  01/24/2018  [name] [name] returns today for follow-up.  Overall he says he is doing really well.  He underwent 2 back injections and has had improvement in his back pain and leg pain.  He had an episode in June where he became somewhat dehydrated.  He presented to the ER with orthostatic hypotension.  His Lasix was cut back to 20 mg daily.  Weight is stayed stable effect is been improved from 252-246.  He says he is actually had enough energy  to play golf recently.  08/01/2018  [name] [name] seen today in routine follow-up.  He has some occasional dizziness which is persistent.  He says he gets a little lightheaded when he urinates.  He has been off of tamsulosin with no specific benefit and now is taking the medication every third day.  Blood pressure is stable.  Weight is stable.  He denies any chest pain or worsening shortness of breath with exertion.  His last echo showed an EF of 40% which is stable if not slightly improved from an echo in 2017.  Unfortunately he could not tolerate Entresto and remains on carvedilol.  He endorses NYHA class I-II heart failure symptoms.  Recent lab work showed total cholesterol 151, HDL 39, LDL 79 triglycerides 166.  Hemoglobin A1c of 7.3.  03/30/2019  [name] [name] seen today in follow-up.  Overall he is doing well and has no complaints.  He has not recently struggled with any of the dizziness he has had previously.  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  In the past he has had EKG changes that were questionable for possible atrial flutter however monitoring as well as an echocardiogram failed to show any evidence of atrial flutter or fibrillation.  This is now clear finding of atrial fibrillation on EKG with an irregularly irregular rhythm with no evidence of P waves.  He reports being asymptomatic with this.  05/12/2019   Mr. Youngblood returns today for follow-up of his A. fib.  He reports he has been compliant with Eliquis.  He says he had a couple episodes of loose stools.  He thought it might be due to the medicine but this seems quite atypical.  He stopped some of his stool softener/bulking agents.  Weight is down about 3 pounds.  He increased his Lasix because he felt like it was a little wheezy.  Possibly could have had some more heart failure because he is in A. fib and   does have a history of heart failure.  EKG again shows atrial  flutter/fibrillation with variable AV response at 74.  Since this is presumably persistent at this point we discussed an elective cardioversion and he is agreeable to this.  06/13/2019  Mr. Cali was seen today in follow-up.  He underwent cardioversion by myself in November.  Since then he has felt fairly well.  His EKG today shows that he is maintaining sinus rhythm.  Blood pressure was 119/63 however he has had some positional dizziness at home.  His daughter notes he has had some blood pressures with low diastolics less than 50 and he does feel some dizziness.  He also feels some fatigue when exerting himself a lot.  I suspect this could be due to hypotension.  Is possible also that his EF has improved now that he is back in sinus and that he may be over diuresed being on both Lasix and Aldactone.  09/15/2019  RandallMikula returns for follow-up.  He reports improvement in his orthostatic symptoms after stopping Aldactone and his PCP also stopped tamsulosin.  He is now on some saw palmetto for BPH.  Unfortunately he does get a little bit more short of breath and I wonder if this is due to need for more diuretic.  His LVEF was 40% last year however has not been reassessed.  I would like to get an echo to recheck that.  03/25/2020  Mr. Dault is seen today for 6-month follow-up.  He denies any further orthostatic symptoms.  He was not feeling well on Aldactone.  He was also taken off tamsulosin.  He is only on carvedilol for heart failure and unfortunately his LVEF declined further in March down to 5 to 40%, previously 40 to 45%.  I had increased his Lasix due to signs of increased LV filling pressure however then he became a little bit prerenal.  His Lasix was then decreased to 40 mg every morning and 20 mg every afternoon.  Since then he has been asymptomatic with heart failure at most with NYHA class II symptoms.  Is been struggling with issues with the CPAP and that the humidifier broke.  This is a  [employer] device and he was told by the company that they are no longer manufacturing them rather they are making ventilators because of COVID-19.  Lipid profile in June 2021 showed total cholesterol 168, HDL 32, triglycerides 106 and 116.  A1c was 7.7 and both remain above goal.  09/20/2020  Mr. Wakeman returns today for follow-up.  Overall he seems to be doing pretty well.  He struggled with a small wound under his left fifth metatarsal.  He is going to the wound center on April 1.  EF had declined somewhat down to 40 to 45%.  Unfortunately has been intolerant of Entresto and losartan more recently causing worsening fatigue.  His options for heart failure medications have been limited.  He is a diabetic with recent A1c at 6.8.  We discussed today the possibility of adding an SGLT2 inhibitor based on positive data in heart failure and diabetes.  01/01/2021  Mr. Andreatta is seen today in follow-up.  He seems somewhat desponded today.  He says he has a lot going on and and unfortunately has been dealing with wound that is nonhealing in his left leg.  He said that even working with the wound center he has developed osteomyelitis.  There talk about possible amputation.  He has been noted to have some mild reduction   in blood flow to that leg.  He is scheduled to actually have a retrograde angiogram tomorrow with Dr. Clark.  He is also going to see Dr. Snider with infectious diseases.  Hopefully they will be able to get control of this before having to consider an amputation.  He does say that he feels better though.  He had come off of alfuzosin which she was taking by his urologist and he now reports being less dizzy.  Also his blood sugars are significantly lower after starting the Jardiance.  08/14/2021  Mr. Knepp returns today for follow-up.  He seems to be doing much better.  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  He still struggles with some balance issues but is done better.  He had 1 episode where he became dizzy or lightheaded in January when he was playing golf in [place].  This seemed to resolve with rest and drinking some water.  He has done well with the Jardiance.  11/17/2021  Mr. Baney is seen today as an acute add-on for shortness of breath.  He reports several weeks of worsening shortness of breath and difficulty breathing.  This past weekend it was difficult for him to sleep.  He called and noted that he increased his Lasix from 40 to 60 mg daily.  I advised him to stay on that because of his meeting today.  He reports over the past couple days he has started to diurese a little more.  He is swelling has improved but his breathing is still somewhat labored and he is fatigued.  Blood pressure was normal today.  Oxygen at saturation 96%.  He had repeat lipids in March, which showed a total cholesterol 172, triglycerides 86, HDL 38 and LDL lower at 118.  EKG today shows normal sinus rhythm.  01/21/2022  Mr. Pugh returns today for follow-up.  He is feeling much better after treatment of his heart failure.  He is down about 30 pounds of fluid.  He is currently on twice daily Lasix.  His echo had shown an LVEF of 30 to 35%.  Subsequently underwent heart catheterization by Dr. Cooper on 12/15/2021 which showed severe native coronary artery disease with patent left main and total occlusion of the LAD after the first diagonal.  The LIMA to LAD was patent.  There was interval occlusion of the SVG to PDA and SVG to PLA.  LVEDP was normal.  Given the fact that he had akinesis of the inferior and posterior walls and lateral walls on echo, the decision was made not to consider intervention of the native left circumflex.  We then pursued viability evaluation with cardiac MRI performed on 01/19/2022, this demonstrated severe LV dilatation with severe systolic dysfunction and LVEF  28%.  There was thinning and akinesis of the inferior and lateral walls.  The RV was mildly dilated with mild systolic dysfunction and RV EF 40%.  No LGE was noted however certain severe thinning of the apical and basal lateral wall and apical inferior wall suggested nonviability.  He was noted to have moderate mitral and tricuspid regurgitation.  08/20/2022  Mr. Sean returns today for follow-up.  He underwent CRT-D implantation back in November with Dr. Camnitz.  This was successful and subsequently he reported improvement in his heart failure symptoms consistent with possible responder status.  He was just seen in   follow-up a couple of weeks ago and was felt to be doing well.  He has had some recent weight gain.  Probably 7 to 10 pounds over the past 3 to 4 months.  He reports some lower extremity edema primarily in his ankles.  He says that he is felt much less dizzy after cutting back on his beta-blocker.  10/12/2022  Mr. Fayette returns today for follow-up.  About a month ago he called in with concerns about worsening shortness of breath.  He had a device interrogation which showed possible fluid accumulation as well as had noted that he had been out of rhythm since mid March.  He was advised to increase his diuretics which she did and has had improvement in his swelling.  He still feels fatigued.  EKG today shows underlying A-fib with a ventricular paced rhythm and PVCs.  He had repeat labs recently including a metabolic profile and BNP.  BNP is elevated at 323 but improved somewhat from 512 about a month ago.  Creatinine is normal but slightly higher at 1.13.  PMHx:  Past Medical History:  Diagnosis Date   Arthritis    Knee L - probably   Atrial fibrillation    BPH (benign prostatic hyperplasia)    CAD (coronary artery disease) 07/06/2011   CHF (congestive heart failure)    Coronary artery disease    Diabetes mellitus    Frequency of urination    Heart failure    High cholesterol     History of nuclear stress test 09/2010   dipyridamole; mild perfusion defect due to attenuation with mild superimposed ischemia in apical septal, apical, apical inferior & apical lateral regions; rest LV enlarged in size; prominent gut uptake in infero-apical region; no significant ischemia demonstrated; low risk scan    HNP (herniated nucleus pulposus), lumbar    Hypertension    Ischemic cardiomyopathy    LV dysfunction 07/06/2011   Nocturia    Right bundle branch block    S/P CABG (coronary artery bypass graft) 08/03/2011   x3; LIMA to LAD,, SVG to PDA, SVG to posterolateral branch of RCA; Dr. B. Bartle   Sleep apnea    sleep study 10/2010- AHI during total sleep 32.1/hr and during REM 62.3/hr (severe sleep apnea)unable to tolerate c pap   Spinal stenosis of lumbar region    Stroke    [place]     Past Surgical History:  Procedure Laterality Date   ABDOMINAL AORTOGRAM W/LOWER EXTREMITY Left 11/14/2020   Procedure: ABDOMINAL AORTOGRAM W/LOWER EXTREMITY;  Surgeon: Clark, Christopher J, MD;  Location: [facility];  Service: Cardiovascular;  Laterality: Left;   ABDOMINAL AORTOGRAM W/LOWER EXTREMITY N/A 01/02/2021   Procedure: ABDOMINAL AORTOGRAM W/LOWER EXTREMITY;  Surgeon: Clark, Christopher J, MD;  Location: [facility];  Service: Cardiovascular;  Laterality: N/A;   AMPUTATION Left 03/20/2021   Procedure: Left foot 5th ray amputation;  Surgeon: Hewitt, John, MD;  Location: [facility] OR;  Service: Orthopedics;  Laterality: Left;   BIV ICD INSERTION CRT-D N/A 04/30/2022   Procedure: BIV ICD INSERTION CRT-D;  Surgeon: Camnitz, Will [name], MD;  Location: [facility];  Service: Cardiovascular;  Laterality: N/A;   CARDIAC CATHETERIZATION  01/2011   ischemic cardiomyopathy 30-35%, multivessel CAD (Dr. T. Kelly)    CARDIAC CATHETERIZATION  09/13/2015   Procedure: Right/Left Heart Cath and Coronary/Graft Angiography;  Surgeon: Josealfredo Adkins C Chimere Klingensmith, MD;  Location: [facility];   Service: Cardiovascular;;   CARDIOVERSION N/A 05/16/2019     Procedure: CARDIOVERSION;  Surgeon: Jaslene Marsteller C, MD;  Location: [facility];  Service: Cardiovascular;  Laterality: N/A;   Colonscopy     CORONARY ARTERY BYPASS GRAFT  08/03/2011   Procedure: CORONARY ARTERY BYPASS GRAFTING (CABG);  Surgeon: Bryan K Bartle, MD;  Location: [facility];  Service: Open Heart Surgery;  Laterality: N/A;  CABG times three using right saphenous vein and left mammary artery usisng endoscope.   LEFT HEART CATH AND CORS/GRAFTS ANGIOGRAPHY N/A 12/15/2021   Procedure: LEFT HEART CATH AND CORS/GRAFTS ANGIOGRAPHY;  Surgeon: Cooper, Michael, MD;  Location: [facility];  Service: Cardiovascular;  Laterality: N/A;   LEFT HEART CATHETERIZATION WITH CORONARY/GRAFT ANGIOGRAM N/A 09/20/2013   Procedure: LEFT HEART CATHETERIZATION WITH CORONARY/GRAFT ANGIOGRAM;  Surgeon: Reshawn Ostlund C. Davanee Klinkner, MD;  Location: [facility];  Service: Cardiovascular;  Laterality: N/A;   Lower Extremity Arterial Doppler  03/16/2013   bilat ABIs demonstated normal values; R runoff - posterior tibial & anterior tibial arteries occluded; L runoff - peroneal & posterior tibial arteries occluded, anterior tibial artery appears occluded   LUMBAR LAMINECTOMY/DECOMPRESSION MICRODISCECTOMY N/A 09/12/2014   Procedure: LUMBAR DECOMPRESSION L3-L4, L4-L5, MICRODISCECTOMY L4-L5;  Surgeon: Jeffrey Beane, MD;  Location: [facility];  Service: Orthopedics;  Laterality: N/A;   PERIPHERAL VASCULAR BALLOON ANGIOPLASTY  11/14/2020   Procedure: PERIPHERAL VASCULAR BALLOON ANGIOPLASTY;  Surgeon: Clark, Christopher J, MD;  Location: [facility];  Service: Cardiovascular;;   PERIPHERAL VASCULAR BALLOON ANGIOPLASTY Left 01/02/2021   Procedure: PERIPHERAL VASCULAR BALLOON ANGIOPLASTY;  Surgeon: Clark, Christopher J, MD;  Location: [facility];  Service: Cardiovascular;  Laterality: Left;  Failed PTA   Pilonidal Cyst removed     TRANSTHORACIC ECHOCARDIOGRAM  11/10/2012   EF  40-45%, mild LVH, mild hypokinesis of anteroseptal myocardium, grade 1 diastolic dysfunction; mild MR & calcifed mitral annulus; LA mild-mod dilated; RA mildly dilated    FAMHx:  Family History  Problem Relation Age of Onset   Cancer Mother    Lung disease Father        & heart disease   Colon polyps Father    Colon cancer Sister    Sudden death Maternal Grandmother    Anesthesia problems Daughter     SOCHx:   reports that he quit smoking about 11 years ago. His smoking use included cigars. He quit smokeless tobacco use about 10 years ago. He reports current alcohol use of about 1.0 - 2.0 standard drink of alcohol per week. He reports that he does not use drugs.  ALLERGIES:  Allergies  Allergen Reactions   Entresto [Sacubitril-Valsartan] Other (See Comments)    dizziness   Sacubitril Other (See Comments)     Dizziness    ROS: Pertinent items noted in HPI and remainder of comprehensive ROS otherwise negative.  HOME MEDS: Current Outpatient Medications  Medication Sig Dispense Refill   acetaminophen (TYLENOL) 500 MG tablet Take 500-1,000 mg by mouth every 6 (six) hours as needed (for pain.).     aspirin EC 81 MG tablet Take 81 mg by mouth at bedtime. Swallow whole.     carvedilol (COREG) 3.125 MG tablet Take 1 tablet (3.125 mg total) by mouth 2 (two) times daily with a meal. 180 tablet 3   ELIQUIS 5 MG TABS tablet Take 1 tablet by mouth twice daily 180 tablet 1   empagliflozin (JARDIANCE) 25 MG TABS tablet Take 1 tablet (25 mg total) by mouth daily. 90 tablet 3   furosemide (LASIX) 40 MG tablet Take 40 mg by   mouth in the morning and at bedtime.     gabapentin (NEURONTIN) 300 MG capsule Take 600 mg by mouth at bedtime.     glipiZIDE (GLUCOTROL XL) 10 MG 24 hr tablet Take 10 mg by mouth at bedtime.     metFORMIN (GLUCOPHAGE-XR) 500 MG 24 hr tablet Take 1,000 mg by mouth 2 (two) times daily.     ONETOUCH VERIO test strip 1 each daily.     rosuvastatin (CRESTOR) 20 MG tablet Take  10 mg by mouth every other day.     No current facility-administered medications for this visit.    LABS/IMAGING: No results found for this or any previous visit (from the past 48 hour(s)).  No results found.  VITALS: BP 120/64   Pulse 65   Ht 6' 2" (1.88 m)   Wt 224 lb 3.2 oz (101.7 kg)   SpO2 96%   BMI 28.79 kg/m   EXAM: General appearance: alert and no distress Neck: JVD - 5 cm above sternal notch, no carotid bruit, and thyroid not enlarged, symmetric, no tenderness/mass/nodules Lungs: dullness to percussion bibasilar Heart: regular rate and rhythm Abdomen: soft, non-tender; bowel sounds normal; no masses,  no organomegaly Extremities: edema trace bilateral sock line Pulses: 2+ and symmetric Skin: Skin color, texture, turgor normal. No rashes or lesions Neurologic: Grossly normal Psych: Pleasant  EKG: Ventricular paced rhythm with PVCs at 65, underlying atrial fibrillation-personally reviewed  ASSESSMENT: Acute on chronic systolic congestive heart failure, LVEF 28% by cMRI (12/2021) -inferior and lateral wall thinning suggestive of nonviable myocardium, NYHA Class I-II symptoms, s/p [employer] CRT-D implant (04/2022) Orthostatic hypotension-resolved History of atrial fibrillation, CHADSVASC score of 5, status post DCCV (04/2019) Chronic systolic congestive heart failure - EF 40-45%% (2021) Coronary artery disease status post three-vessel CABG in 2013 (LIMA to LAD, SVG to PDA and SVG to PLA) -occluded SVG to PDA and SVG to PLA by cath 11/2021 Hypertension-controlled Dyslipidemia Diabetes type 2 - better controlled Mild PVD-with normal ABI Hypotension/upper back pain - resolved Spinal stenosis - s/p surgery Trifascicular block OSA-back on BiPAP  PLAN: 1.   Mr. Faraone appears to be in atrial fibrillation which has been present for about a month.  He had acute on chronic systolic congestive heart failure with this but is improving on higher dose diuretics.  Since he has  a CRT-D device, he is losing effectiveness of the device with regards to his heart failure while he is in atrial fibrillation.  We discussed the importance of trying to establish sinus rhythm.  He had a prior cardioversion I believe around 2020.  We discussed arranging for another cardioversion and he is agreeable to that.  Will try to arrange if possible this week.  Shared Decision Making/Informed Consent The risks (stroke, cardiac arrhythmias rarely resulting in the need for a temporary or permanent pacemaker, skin irritation or burns and complications associated with conscious sedation including aspiration, arrhythmia, respiratory failure and death), benefits (restoration of normal sinus rhythm) and alternatives of a direct current cardioversion were explained in detail to Mr. Jorge and he agrees to proceed.   Plan follow-up afterward.  Dasean Brow C. Dalya Maselli, MD, FACC, FACP    [facility]  Medical Director of the [facility] Diplomate of the American Board of Clinical Lipidology Attending Cardiologist  Direct Dial: [phone]  Fax: [phone]  Website:  [email or web address]  Yasuko Lapage C Starla Deller 10/12/2022, 1:48 PM 

## 2022-10-12 NOTE — Progress Notes (Signed)
OFFICE NOTE  Chief Complaint:  Follow-up CHF  Primary Care Physician: [name], MD  HPI:  [name] [name] is a pleasant 77 year old gentleman previously followed by Dr. Alanda Amass. His past medical history is significant for coronary artery disease. In 2013 he underwent multivessel CABG for an ischemic cardiomyopathy. EF was 20-25% prior to surgery however post surgery his EF had improved up to 45-50%. Recently his EF was 40-45% by echo in May of 2014. There was mild mitral regurgitation, mild to moderately dilated left atrium and mild LVH. [name] [name] has been describing some anxiety. He also reports some pain in his legs however underwent Dopplers in September 2014 which showed preserved ABIs bilaterally a 1.0 on the right and 1.1 on the left. The bilateral peroneal and posterior tibial arteries were occluded. He reports some optimal control of his diabetes. His A1c was 7.2. He is concerned about the cost of taking Tradjenta. He is followed by Dr. Leslie Dales.   He was recently seen in the emergency room and he can hospital. There he presented with hypotension and was given 1 L normal saline and his symptoms improved. He had been having dizziness as well as some upper back pain into the left shoulder blade with exertion recently. The symptoms are worse particularly when climbing up ladders or going up stairs and are relieved at rest. After that hospitalization it was recommended that he discontinue his ACE inhibitor and beta blocker, and his blood pressure appears improved today up to 110/60.  [name] [name] returns today for followup of his stress test. This is interpreted as nonischemic with an EF of 44%. He reports still feeling very fatigued and extremely short of breath when doing activities. He says that the other day he tried to hit golf balls and felt that he was totally exhausted after just an hour. He reports his blood pressure has improved somewhat off of all medications however remains  low. He has had symptoms of orthostatic hypotension in the past. His symptoms do feel similar to prior to his bypass surgery. He also feels that he was much better after surgery than he is now and that his symptoms have been going on for the past 2 months.  At his last office visit, I recommended that [name] [name] have a repeat cardiac catheterization (08/2013). This demonstrated the following:  Impression:  1. 2 vessel native CAD with occluded LAD in the mid-vessel  2. Patent LIMA-LAD, SVG to PLA and SVG to PDA grafts with TIMI III flow  3. LVEDP = 13 mmHg  4. Random PVC's were noted  Based on these findings, I could not find any new cardiac cause of his symptoms. I suspect that some of his fatigue could be related to labile blood sugars. He says unexplained hypotension but is normotensive off of medications.  [name] [name] returns today for followup. He now reports feeling better since he had change in his medications. He is established with Dr. Sharl Ma who adjusted his diabetes medicines and stopped his Invokana. His shortness of breath and fatigue in both improved. He is now been expected some problems with left heel pain. He was found to have some spinal stenosis but has had improvement with inserts in his shoes.  I saw [name] [name] back in the office today. He has successfully underwent back surgery earlier this year which she says initially was much improved, however he says he subsequently developed more pain going down his legs. This sounds like it's neuropathic pain, but  is reluctant to try medication for it. He also significantly fatigue. This could be due to a number of etiologies. 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 Also, he is noted to have a history of obstructive sleep apnea. He was placed on BiPAP, but has not been compliant with that due to difficulty wearing the mask. He also never  had follow-up of his sleep study.  [name] [name] returns today for follow-up. He reports he's had worsening dyspnea and fatigue. He says that he can hardly sleep at night. He is struggling with significant anxiety. 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 He's not been wearing the BiPAP due to significant anxiety and difficulty sleeping. He feels like he gets smothered with his machine. Communication between Dr. Radford Pax and Dr. Quintin Alto led to the starting of Celexa as well as Klonopin to use as needed at night. He says that it does give him about 6 hours of sleep. He has not been using his BiPAP as instructed. He is reportedly had more worsening shortness of breath. He was seen in the ER for this and it was thought this was due [email or web address] with BiPAP. His BNP however is elevated over 300. Today in the office he says that he's had more swelling and more abdominal fullness as well as leg edema.  I had the pleasure seeing [name] [name] back today in the office. He seems to respond nicely to Lasix. I started him on 40 mg daily and while I was out of town my partner reviewed his lab work which included an elevated BNP over 300. He increased his Lasix to 40 mg twice a day. [name] [name] says he's had a significant improvement in his breathing. The BNP now is in the mid 200s. Renal function is stable based on labs today. Unfortunately, his echocardiogram does show a new cardiomyopathy with EF 30-35%. It should be noted that he felt "awful" on blood pressure/heart failure medicines and had taken himself off of ACE inhibitor and beta blocker in the past. The echo however does suggest inferior and anterolateral wall motion abnormalities which are new and could indicate graft dysfunction. I performed his last heart catheterization in 2015 which showed all 3 grafts patent.  [name] [name] returns today for follow-up of his left heart catheterization. I perform this a  few weeks ago and he had patent bypass grafts however LV function is reduced. Cardiac output is also reduced. Filling pressures were mildly elevated. He reports since discharge that he's had some improvement of his symptoms. He was started on low-dose beta blocker but he does report fatigue. When I asked him how they compared to previously he said he thinks attack sleep better since he started on the beta blocker but not back to what he thinks his baseline should be. He continues to have problems with left leg pain. He had ultrasound of the left leg which were unrevealing. He also had nerve conduction studies which I sent him for which were not suggestive of neuropathy. His symptoms were worse while lying flat on the Cath Lab table and I suspect this could be again coming from his back and he may need repeat orthopedic evaluation. His primary care provider started him on Celexa recently as well for anxiety.  12/23/2015  [name] [name] was seen back in the office today in follow-up.  Overall he seems to be doing well. He's tolerating low-dose carvedilol and has had some very infrequent morning dizziness. He was previously on Lasix 40 mg twice a day but ran out of the medicine about 8 days ago. He's not had any worsening weight gain or swelling nor worsening shortness of breath over the past week. This could be that he's had some improvement in his cardiomyopathy. Nevertheless, I would like him to be on some Lasix at least until we can determine whether or not he is euvolemic with lab work.  05/27/2016  [name] [name] returns today for follow-up. He's gained about 6 pounds of weight since I last saw him. He denies any worsening shortness of breath or orthostatic dizziness. He saw Dr. Radford Pax in August for following of his obstructive sleep apnea. He remains physically active and denies any chest pain. He recently had a repeat echo in October 2017 which showed a small improvement in LV function with EF up to 35-40%. Heart  failure regimen includes aspirin, carvedilol, Crestor, and spironolactone. He is not currently on a ARB is he's had orthostatic symptoms in the past although that his generally resolved. His creatinine is normal.  08/24/2016  I saw [name] [name] today in follow-up. He again gained about 4 pounds over the past month. He felt that the weight gain was heart failure related because he got more short of breath particularly walking up stairs. Based on that he increased his Lasix back to 40 mg daily. He notes that his urine is been darker and his urination is not necessarily improved. He's also been more dizzy. He says he gets somewhat presyncopal with change in position. Lab work 12 days ago indicated a stable creatinine however. Despite this, I feel that he may be somewhat presyncopal perhaps on too high of a dose of diuretics. He is also on Entresto 24/26 and Aldactone 12.5 mg daily. He also complained of some pain across the back between the shoulder blades. He said this got better with resting and drinking water. It's difficult to say whether that it was the water or perhaps resting from his activities. I cannot exclude that this could be angina. His EKG however is reassuring today.  09/21/2016  [name] [name] was seen today in follow-up. I decreased his Lasix, however he does not feel any change in his dizziness. There is no evidence of presyncope. He has gained about 4 pounds which I suspect is some fluid retention. Blood pressure is well-controlled today 118/56.  02/26/2017  [name] [name] returns today for follow-up. He had more recent dizziness and it seemed to worsen on Entresto. He discontinued that and feels that he's now much better. Overall he says he feels well. He is not on ACE inhibitor or ARB but remains on carvedilol. Is also on Lasix 40 mg daily. Blood pressure is stable 122/52.  08/10/2017  [name] [name] returns for follow-up.  Overall he seems to be feeling very well.  Denies any chest pain or  worsening shortness of breath.  He gets occasional dizziness but that is much improved.  Of note an EKG was performed again today and there is a suggestion that there may be underlying atrial flutter.  Heart rate was 74 and EKG looks very similar to his prior EKG.  The computer has interpreted a sinus rhythm with first-degree AV block and bifascicular block.  I compared EKG to his previous EKG last August which looks similar however it is distinctly different from the EKG last of February.  He is asymptomatic, but not anticoagulated.  Otherwise, labs reviewed from October indicate total cholesterol 134, HDL 36 LDL 67 and triglycerides 157.  Hemoglobin A1c of 7.6 and serum creatinine 0.9.  08/30/2017  [name] [name] returns today for follow-up of his studies.  He underwent an echocardiogram to evaluate for change in LVEF, but more importantly to rule out atrial flutter.  His EKG was abnormal and suggested possible atrial flutter, however he was asymptomatic.  The echo did not show any evidence of atrial flutter.  LVEF was stable at 40%.  He also underwent a home 48-hour Holter monitor.  This demonstrated sinus rhythm with PACs and PVCs, but no evidence of atrial fibrillation or flutter.  Remains asymptomatic.  His only other complaint today is chronic leg pain.  He was seen by Dr. Gwenlyn Found and evaluated with lower extremity arterial Dopplers.  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  A referral to neurosurgery was suggested but not placed.  01/24/2018  [name] [name] returns today for follow-up.  Overall he says he is doing really well.  He underwent 2 back injections and has had improvement in his back pain and leg pain.  He had an episode in June where he became somewhat dehydrated.  He presented to the ER with orthostatic hypotension.  His Lasix was cut back to 20 mg daily.  Weight is stayed stable effect is been improved from 252-246.  He says he is actually had enough energy  to play golf recently.  08/01/2018  Mr. Patrizio seen today in routine follow-up.  He has some occasional dizziness which is persistent.  He says he gets a little lightheaded when he urinates.  He has been off of tamsulosin with no specific benefit and now is taking the medication every third day.  Blood pressure is stable.  Weight is stable.  He denies any chest pain or worsening shortness of breath with exertion.  His last echo showed an EF of 40% which is stable if not slightly improved from an echo in 2017.  Unfortunately he could not tolerate Entresto and remains on carvedilol.  He endorses NYHA class I-II heart failure symptoms.  Recent lab work showed total cholesterol 151, HDL 39, LDL 79 triglycerides 166.  Hemoglobin A1c of 7.3.  03/30/2019  Mr. Bratz seen today in follow-up.  Overall he is doing well and has no complaints.  He has not recently struggled with any of the dizziness he has had previously.  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  In the past he has had EKG changes that were questionable for possible atrial flutter however monitoring as well as an echocardiogram failed to show any evidence of atrial flutter or fibrillation.  This is now clear finding of atrial fibrillation on EKG with an irregularly irregular rhythm with no evidence of P waves.  He reports being asymptomatic with this.  05/12/2019   Mr. Helmkamp returns today for follow-up of his A. fib.  He reports he has been compliant with Eliquis.  He says he had a couple episodes of loose stools.  He thought it might be due to the medicine but this seems quite atypical.  He stopped some of his stool softener/bulking agents.  Weight is down about 3 pounds.  He increased his Lasix because he felt like it was a little wheezy.  Possibly could have had some more heart failure because he is in A. fib and  does have a history of heart failure.  EKG again shows atrial  flutter/fibrillation with variable AV response at 74.  Since this is presumably persistent at this point we discussed an elective cardioversion and he is agreeable to this.  06/13/2019  Mr. Miske was seen today in follow-up.  He underwent cardioversion by myself in November.  Since then he has felt fairly well.  His EKG today shows that he is maintaining sinus rhythm.  Blood pressure was 119/63 however he has had some positional dizziness at home.  His daughter notes he has had some blood pressures with low diastolics less than 50 and he does feel some dizziness.  He also feels some fatigue when exerting himself a lot.  I suspect this could be due to hypotension.  Is possible also that his EF has improved now that he is back in sinus and that he may be over diuresed being on both Lasix and Aldactone.  09/15/2019  RandallBrandow returns for follow-up.  He reports improvement in his orthostatic symptoms after stopping Aldactone and his PCP also stopped tamsulosin.  He is now on some saw palmetto for BPH.  Unfortunately he does get a little bit more short of breath and I wonder if this is due to need for more diuretic.  His LVEF was 40% last year however has not been reassessed.  I would like to get an echo to recheck that.  03/25/2020  Mr. Cheng is seen today for 54-monthfollow-up.  He denies any further orthostatic symptoms.  He was not feeling well on Aldactone.  He was also taken off tamsulosin.  He is only on carvedilol for heart failure and unfortunately his LVEF declined further in March down to 5 to 40%, previously 40 to 45%.  I had increased his Lasix due to signs of increased LV filling pressure however then he became a little bit prerenal.  His Lasix was then decreased to 40 mg every morning and 20 mg every afternoon.  Since then he has been asymptomatic with heart failure at most with NYHA class II symptoms.  Is been struggling with issues with the CPAP and that the humidifier broke.  This is a  [employer] device and he was told by the company that they are no longer manufacturing them rather they are making ventilators because of COVID-19.  Lipid profile in June 2021 showed total cholesterol 168, HDL 32, triglycerides 106 and 116.  A1c was 7.7 and both remain above goal.  09/20/2020  Mr. TKunselmanreturns today for follow-up.  Overall he seems to be doing pretty well.  He struggled with a small wound under his left fifth metatarsal.  He is going to the wound center on April 1.  EF had declined somewhat down to 40 to 45%.  Unfortunately has been intolerant of Entresto and losartan more recently causing worsening fatigue.  His options for heart failure medications have been limited.  He is a diabetic with recent A1c at 6.8.  We discussed today the possibility of adding an SGLT2 inhibitor based on positive data in heart failure and diabetes.  01/01/2021  Mr. TMowattis seen today in follow-up.  He seems somewhat desponded today.  He says he has a lot going on and and unfortunately has been dealing with wound that is nonhealing in his left leg.  He said that even working with the wound center he has developed osteomyelitis.  There talk about possible amputation.  He has been noted to have some mild reduction  in blood flow to that leg.  He is scheduled to actually have a retrograde angiogram tomorrow with [name].  He is also going to see Dr. Baxter Flattery with infectious diseases.  Hopefully they will be able to get control of this before having to consider an amputation.  He does say that he feels better though.  He had come off of alfuzosin which she was taking by his urologist and he now reports being less dizzy.  Also his blood sugars are significantly lower after starting the Jardiance.  08/14/2021  Mr. Zoll returns today for follow-up.  He seems to be doing much better.  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  He still struggles with some balance issues but is done better.  He had 1 episode where he became dizzy or lightheaded in January when he was playing golf in [place].  This seemed to resolve with rest and drinking some water.  He has done well with the Jardiance.  11/17/2021  Mr. Quillan is seen today as an acute add-on for shortness of breath.  He reports several weeks of worsening shortness of breath and difficulty breathing.  This past weekend it was difficult for him to sleep.  He called and noted that he increased his Lasix from 40 to 60 mg daily.  I advised him to stay on that because of his meeting today.  He reports over the past couple days he has started to diurese a little more.  He is swelling has improved but his breathing is still somewhat labored and he is fatigued.  Blood pressure was normal today.  Oxygen at saturation 96%.  He had repeat lipids in March, which showed a total cholesterol 172, triglycerides 86, HDL 38 and LDL lower at 118.  EKG today shows normal sinus rhythm.  01/21/2022  Mr. Barger returns today for follow-up.  He is feeling much better after treatment of his heart failure.  He is down about 30 pounds of fluid.  He is currently on twice daily Lasix.  His echo had shown an LVEF of 30 to 35%.  Subsequently underwent heart catheterization by Dr. Burt Knack on 12/15/2021 which showed severe native coronary artery disease with patent left main and total occlusion of the LAD after the first diagonal.  The LIMA to LAD was patent.  There was interval occlusion of the SVG to PDA and SVG to PLA.  LVEDP was normal.  Given the fact that he had akinesis of the inferior and posterior walls and lateral walls on echo, the decision was made not to consider intervention of the native left circumflex.  We then pursued viability evaluation with cardiac MRI performed on 01/19/2022, this demonstrated severe LV dilatation with severe systolic dysfunction and LVEF  28%.  There was thinning and akinesis of the inferior and lateral walls.  The RV was mildly dilated with mild systolic dysfunction and RV EF 40%.  No LGE was noted however certain severe thinning of the apical and basal lateral wall and apical inferior wall suggested nonviability.  He was noted to have moderate mitral and tricuspid regurgitation.  08/20/2022  Mr. Salk returns today for follow-up.  He underwent CRT-D implantation back in November with Dr. Curt Bears.  This was successful and subsequently he reported improvement in his heart failure symptoms consistent with possible responder status.  He was just seen in  follow-up a couple of weeks ago and was felt to be doing well.  He has had some recent weight gain.  Probably 7 to 10 pounds over the past 3 to 4 months.  He reports some lower extremity edema primarily in his ankles.  He says that he is felt much less dizzy after cutting back on his beta-blocker.  10/12/2022  Mr. Blechinger returns today for follow-up.  About a month ago he called in with concerns about worsening shortness of breath.  He had a device interrogation which showed possible fluid accumulation as well as had noted that he had been out of rhythm since mid March.  He was advised to increase his diuretics which she did and has had improvement in his swelling.  He still feels fatigued.  EKG today shows underlying A-fib with a ventricular paced rhythm and PVCs.  He had repeat labs recently including a metabolic profile and BNP.  BNP is elevated at 323 but improved somewhat from 512 about a month ago.  Creatinine is normal but slightly higher at 1.13.  PMHx:  Past Medical History:  Diagnosis Date   Arthritis    Knee L - probably   Atrial fibrillation    BPH (benign prostatic hyperplasia)    CAD (coronary artery disease) 07/06/2011   CHF (congestive heart failure)    Coronary artery disease    Diabetes mellitus    Frequency of urination    Heart failure    High cholesterol     History of nuclear stress test 09/2010   dipyridamole; mild perfusion defect due to attenuation with mild superimposed ischemia in apical septal, apical, apical inferior & apical lateral regions; rest LV enlarged in size; prominent gut uptake in infero-apical region; no significant ischemia demonstrated; low risk scan    HNP (herniated nucleus pulposus), lumbar    Hypertension    Ischemic cardiomyopathy    LV dysfunction 07/06/2011   Nocturia    Right bundle branch block    S/P CABG (coronary artery bypass graft) 08/03/2011   x3; LIMA to LAD,, SVG to PDA, SVG to posterolateral branch of RCA; Dr. Wayland Salinas   Sleep apnea    sleep study 10/2010- AHI during total sleep 32.1/hr and during REM 62.3/hr (severe sleep apnea)unable to tolerate c pap   Spinal stenosis of lumbar region    Stroke    [place]     Past Surgical History:  Procedure Laterality Date   ABDOMINAL AORTOGRAM W/LOWER EXTREMITY Left 11/14/2020   Procedure: ABDOMINAL AORTOGRAM W/LOWER EXTREMITY;  Surgeon: Cephus Shelling, MD;  Location: [facility] INVASIVE CV LAB;  Service: Cardiovascular;  Laterality: Left;   ABDOMINAL AORTOGRAM W/LOWER EXTREMITY N/A 01/02/2021   Procedure: ABDOMINAL AORTOGRAM W/LOWER EXTREMITY;  Surgeon: Cephus Shelling, MD;  Location: [facility] INVASIVE CV LAB;  Service: Cardiovascular;  Laterality: N/A;   AMPUTATION Left 03/20/2021   Procedure: Left foot 5th ray amputation;  Surgeon: Toni Arthurs, MD;  Location[facility];  Service: Orthopedics;  Laterality: Left;   BIV ICD INSERTION CRT-D N/A 04/30/2022   Procedure: BIV ICD INSERTION CRT-D;  Surgeon: [name], MD;  Location: [facility] INVASIVE CV LAB;  Service: Cardiovascular;  Laterality: N/A;   CARDIAC CATHETERIZATION  01/2011   ischemic cardiomyopathy 30-35%, multivessel CAD (Dr. Bishop Limbo)    CARDIAC CATHETERIZATION  09/13/2015   Procedure: Right/Left Heart Cath and Coronary/Graft Angiography;  Surgeon: [name], MD;  Location: [facility] INVASIVE CV LAB;   Service: Cardiovascular;;   CARDIOVERSION N/A 05/16/2019  Procedure: CARDIOVERSION;  Surgeon: [name], MD;  Location: [facility] ENDOSCOPY;  Service: Cardiovascular;  Laterality: N/A;   Colonscopy     CORONARY ARTERY BYPASS GRAFT  08/03/2011   Procedure: CORONARY ARTERY BYPASS GRAFTING (CABG);  Surgeon: Alleen Borne, MD;  Location[facility];  Service: Open Heart Surgery;  Laterality: N/A;  CABG times three using right saphenous vein and left mammary artery usisng endoscope.   LEFT HEART CATH AND CORS/GRAFTS ANGIOGRAPHY N/A 12/15/2021   Procedure: LEFT HEART CATH AND CORS/GRAFTS ANGIOGRAPHY;  Surgeon: Tonny Bollman, MD;  Location: [facility] INVASIVE CV LAB;  Service: Cardiovascular;  Laterality: N/A;   LEFT HEART CATHETERIZATION WITH CORONARY/GRAFT ANGIOGRAM N/A 09/20/2013   Procedure: LEFT HEART CATHETERIZATION WITH Isabel Caprice;  Surgeon: [name], MD;  Location: [facility] CATH LAB;  Service: Cardiovascular;  Laterality: N/A;   Lower Extremity Arterial Doppler  03/16/2013   bilat ABIs demonstated normal values; R runoff - posterior tibial & anterior tibial arteries occluded; L runoff - peroneal & posterior tibial arteries occluded, anterior tibial artery appears occluded   LUMBAR LAMINECTOMY/DECOMPRESSION MICRODISCECTOMY N/A 09/12/2014   Procedure: LUMBAR DECOMPRESSION L3-L4, L4-L5, MICRODISCECTOMY L4-L5;  Surgeon: Jene Every, MD;  Location: [facility] ORS;  Service: Orthopedics;  Laterality: N/A;   PERIPHERAL VASCULAR BALLOON ANGIOPLASTY  11/14/2020   Procedure: PERIPHERAL VASCULAR BALLOON ANGIOPLASTY;  Surgeon: Cephus Shelling, MD;  Location: [facility] INVASIVE CV LAB;  Service: Cardiovascular;;   PERIPHERAL VASCULAR BALLOON ANGIOPLASTY Left 01/02/2021   Procedure: PERIPHERAL VASCULAR BALLOON ANGIOPLASTY;  Surgeon: Cephus Shelling, MD;  Location: [facility] INVASIVE CV LAB;  Service: Cardiovascular;  Laterality: Left;  Failed PTA   Pilonidal Cyst removed     TRANSTHORACIC ECHOCARDIOGRAM  11/10/2012   EF  40-45%, mild LVH, mild hypokinesis of anteroseptal myocardium, grade 1 diastolic dysfunction; mild MR & calcifed mitral annulus; LA mild-mod dilated; RA mildly dilated    FAMHx:  Family History  Problem Relation Age of Onset   Cancer Mother    Lung disease Father        & heart disease   Colon polyps Father    Colon cancer Sister    Sudden death Maternal Grandmother    Anesthesia problems Daughter     SOCHx:   reports that he quit smoking about 11 years ago. His smoking use included cigars. He quit smokeless tobacco use about 10 years ago. He reports current alcohol use of about 1.0 - 2.0 standard drink of alcohol per week. He reports that he does not use drugs.  ALLERGIES:  Allergies  Allergen Reactions   Entresto [Sacubitril-Valsartan] Other (See Comments)    dizziness   Sacubitril Other (See Comments)     Dizziness    ROS: Pertinent items noted in HPI and remainder of comprehensive ROS otherwise negative.  HOME MEDS: Current Outpatient Medications  Medication Sig Dispense Refill   acetaminophen (TYLENOL) 500 MG tablet Take 500-1,000 mg by mouth every 6 (six) hours as needed (for pain.).     aspirin EC 81 MG tablet Take 81 mg by mouth at bedtime. Swallow whole.     carvedilol (COREG) 3.125 MG tablet Take 1 tablet (3.125 mg total) by mouth 2 (two) times daily with a meal. 180 tablet 3   ELIQUIS 5 MG TABS tablet Take 1 tablet by mouth twice daily 180 tablet 1   empagliflozin (JARDIANCE) 25 MG TABS tablet Take 1 tablet (25 mg total) by mouth daily. 90 tablet 3   furosemide (LASIX) 40 MG tablet Take 40 mg by  mouth in the morning and at bedtime.     gabapentin (NEURONTIN) 300 MG capsule Take 600 mg by mouth at bedtime.     glipiZIDE (GLUCOTROL XL) 10 MG 24 hr tablet Take 10 mg by mouth at bedtime.     metFORMIN (GLUCOPHAGE-XR) 500 MG 24 hr tablet Take 1,000 mg by mouth 2 (two) times daily.     ONETOUCH VERIO test strip 1 each daily.     rosuvastatin (CRESTOR) 20 MG tablet Take  10 mg by mouth every other day.     No current facility-administered medications for this visit.    LABS/IMAGING: No results found for this or any previous visit (from the past 48 hour(s)).  No results found.  VITALS: BP 120/64   Pulse 65   Ht  (1.88 m)   Wt 224 lb 3.2 oz (101.7 kg)   SpO2 96%   BMI 28.79 kg/m   EXAM: General appearance: alert and no distress Neck: JVD - 5 cm above sternal notch, no carotid bruit, and thyroid not enlarged, symmetric, no tenderness/mass/nodules Lungs: dullness to percussion bibasilar Heart: regular rate and rhythm Abdomen: soft, non-tender; bowel sounds normal; no masses,  no organomegaly Extremities: edema trace bilateral sock line Pulses: 2+ and symmetric Skin: Skin color, texture, turgor normal. No rashes or lesions Neurologic: Grossly normal Psych: Pleasant  EKG: Ventricular paced rhythm with PVCs at 65, underlying atrial fibrillation-personally reviewed  ASSESSMENT: Acute on chronic systolic congestive heart failure, LVEF 28% by cMRI (12/2021) -inferior and lateral wall thinning suggestive of nonviable myocardium, NYHA Class I-II symptoms, s/p Medtronic CRT-D implant (04/2022) Orthostatic hypotension-resolved History of atrial fibrillation, CHADSVASC score of 5, status post DCCV (04/2019) Chronic systolic congestive heart failure - EF 40-45%% (2021) Coronary artery disease status post three-vessel CABG in 2013 (LIMA to LAD, SVG to PDA and SVG to PLA) -occluded SVG to PDA and SVG to PLA by cath 11/2021 Hypertension-controlled Dyslipidemia Diabetes type 2 - better controlled Mild PVD-with normal ABI Hypotension/upper back pain - resolved Spinal stenosis - s/p surgery Trifascicular block OSA-back on BiPAP  PLAN: 1.   Mr. Kocak appears to be in atrial fibrillation which has been present for about a month.  He had acute on chronic systolic congestive heart failure with this but is improving on higher dose diuretics.  Since he has  a CRT-D device, he is losing effectiveness of the device with regards to his heart failure while he is in atrial fibrillation.  We discussed the importance of trying to establish sinus rhythm.  He had a prior cardioversion I believe around 2020.  We discussed arranging for another cardioversion and he is agreeable to that.  Will try to arrange if possible this week.  Shared Decision Making/Informed Consent The risks (stroke, cardiac arrhythmias rarely resulting in the need for a temporary or permanent pacemaker, skin irritation or burns and complications associated with conscious sedation including aspiration, arrhythmia, respiratory failure and death), benefits (restoration of normal sinus rhythm) and alternatives of a direct current cardioversion were explained in detail to Mr. Douthit and he agrees to proceed.   Plan follow-up afterward.  [name], MD, [facility], FACP  [place]  [facility]  Medical Director of the [facility] Lipid Disorders &  Cardiovascular Risk Reduction Clinic Diplomate of the American Board of Clinical Lipidology Attending Cardiologist  Direct Dial: [phone]  Fax: [phone]  Website:  [email or web address] Nicely Shenna Brissette 10/12/2022, 1:48 PM

## 2022-10-12 NOTE — Patient Instructions (Addendum)
Medication Instructions:  No Changes In Medications at this time.  *If you need a refill on your cardiac medications before your next appointment, please call your pharmacy*  Lab Work: LABS TODAY  If you have labs (blood work) drawn today and your tests are completely normal, you will receive your results only by: MyChart Message (if you have MyChart) OR A paper copy in the mail If you have any lab test that is abnormal or we need to change your treatment, we will call you to review the results.  Testing/Procedures: Your physician has recommended that you have a Cardioversion (DCCV). Electrical Cardioversion uses a jolt of electricity to your heart either through paddles or wired patches attached to your chest. This is a controlled, usually prescheduled, procedure. Defibrillation is done under light anesthesia in the hospital, and you usually go home the day of the procedure. This is done to get your heart back into a normal rhythm. You are not awake for the procedure. Please see the instruction sheet given to you today.  Follow-Up: At [facility], you and your health needs are our priority.  As part of our continuing mission to provide you with exceptional heart care, we have created designated Provider Care Teams.  These Care Teams include your primary Cardiologist (physician) and Advanced Practice Providers (APPs -  Physician Assistants and Nurse Practitioners) who all work together to provide you with the care you need, when you need it.   Your next appointment:   1 month(s) WITH ANY APP   Provider:   [name], MD         Dear [name] [name]  You are scheduled for a Cardioversion on Friday, April 19 with Dr. Royann Shivers.  Please arrive at the [facility] (Main Entrance A) at [facility]: [address] [place], [place] [address] at 8:00 AM.    DIET:  Nothing to eat or drink after midnight except a sip of water with medications (see medication instructions  below)  MEDICATION INSTRUCTIONS: !!IF ANY NEW MEDICATIONS ARE STARTED AFTER TODAY, PLEASE NOTIFY YOUR PROVIDER AS SOON AS POSSIBLE!!  FYI: Medications such as Semaglutide (Ozempic, [place]), Tirzepatide (Mounjaro, Zepbound), Dulaglutide (Trulicity), etc ("GLP1 agonists") must be held around the time of a procedure. Talk to your provider if you take one of these.        :1}Continue taking your anticoagulant (blood thinner): Apixaban (Eliquis).  You will need to continue this after your procedure until you are told by your provider that it is safe to stop.    DON'T TAKE ANY DIABETES MEDICATION THE MORNING OF PROCEDURE  LABS: TODAY   FYI:  For your safety, and to allow [place] to monitor your vital signs accurately during the surgery/procedure we request: If you have artificial nails, gel coating, SNS etc, please have those removed prior to your surgery/procedure. Not having the nail coverings /polish removed may result in cancellation or delay of your surgery/procedure.  You must have a responsible person to drive you home and stay in the waiting area during your procedure. Failure to do so could result in cancellation.  Bring your insurance cards.  *Special Note: Every effort is made to have your procedure done on time. Occasionally there are emergencies that occur at the hospital that may cause delays. Please be patient if a delay does occur.

## 2022-10-13 ENCOUNTER — Other Ambulatory Visit: Payer: Self-pay | Admitting: Internal Medicine

## 2022-10-13 DIAGNOSIS — I4891 Unspecified atrial fibrillation: Secondary | ICD-10-CM

## 2022-10-13 LAB — CBC
Hematocrit: 36.3 % — ABNORMAL LOW (ref 37.5–51.0)
Hemoglobin: 12.1 g/dL — ABNORMAL LOW (ref 13.0–17.7)
MCH: 28.3 pg (ref 26.6–33.0)
MCHC: 33.3 g/dL (ref 31.5–35.7)
MCV: 85 fL (ref 79–97)
Platelets: 182 10*3/uL (ref 150–450)
RBC: 4.28 x10E6/uL (ref 4.14–5.80)
RDW: 14.2 % (ref 11.6–15.4)
WBC: 5.1 10*3/uL (ref 3.4–10.8)

## 2022-10-13 LAB — BASIC METABOLIC PANEL
BUN/Creatinine Ratio: 16 (ref 10–24)
BUN: 17 mg/dL (ref 8–27)
CO2: 26 mmol/L (ref 20–29)
Calcium: 8.9 mg/dL (ref 8.6–10.2)
Chloride: 101 mmol/L (ref 96–106)
Creatinine, Ser: 1.04 mg/dL (ref 0.76–1.27)
Glucose: 190 mg/dL — ABNORMAL HIGH (ref 70–99)
Potassium: 4.8 mmol/L (ref 3.5–5.2)
Sodium: 144 mmol/L (ref 134–144)
eGFR: 73 mL/min/{1.73_m2} (ref >59)

## 2022-10-15 NOTE — Pre-Procedure Instructions (Signed)
Instructed patient on following items: Arrival time 0830 NPO after MN... Bp and anticoagulation meds ok with sips of water AM of procedure Responsible party to stay home after procedure and for 24hrs Have you missed any does of anticoagulant?Pt instructed to take schedule does of eliquis, denies missing any does .

## 2022-10-16 ENCOUNTER — Ambulatory Visit (HOSPITAL_COMMUNITY): Payer: Medicare Other | Admitting: Anesthesiology

## 2022-10-16 ENCOUNTER — Encounter (HOSPITAL_COMMUNITY)
Admission: RE | Disposition: A | Discharge status: Home / Self Care | Payer: Self-pay | Source: Home / Self Care | Attending: Cardiovascular Disease

## 2022-10-16 ENCOUNTER — Other Ambulatory Visit: Payer: Self-pay

## 2022-10-16 ENCOUNTER — Encounter (HOSPITAL_COMMUNITY): Payer: Self-pay | Admitting: Cardiovascular Disease

## 2022-10-16 ENCOUNTER — Ambulatory Visit (HOSPITAL_COMMUNITY)
Admission: RE | Admit: 2022-10-16 | Discharge: 2022-10-16 | Disposition: A | Discharge status: Home / Self Care | Payer: Medicare Other | Attending: Cardiovascular Disease | Admitting: Cardiovascular Disease

## 2022-10-16 ENCOUNTER — Ambulatory Visit (HOSPITAL_BASED_OUTPATIENT_CLINIC_OR_DEPARTMENT_OTHER): Payer: Medicare Other | Admitting: Anesthesiology

## 2022-10-16 DIAGNOSIS — I4891 Unspecified atrial fibrillation: Secondary | ICD-10-CM | POA: Insufficient documentation

## 2022-10-16 DIAGNOSIS — I2510 Atherosclerotic heart disease of native coronary artery without angina pectoris: Secondary | ICD-10-CM

## 2022-10-16 DIAGNOSIS — E785 Hyperlipidemia, unspecified: Secondary | ICD-10-CM | POA: Diagnosis not present

## 2022-10-16 DIAGNOSIS — Z951 Presence of aortocoronary bypass graft: Secondary | ICD-10-CM | POA: Insufficient documentation

## 2022-10-16 DIAGNOSIS — I509 Heart failure, unspecified: Secondary | ICD-10-CM

## 2022-10-16 DIAGNOSIS — Z95810 Presence of automatic (implantable) cardiac defibrillator: Secondary | ICD-10-CM | POA: Insufficient documentation

## 2022-10-16 DIAGNOSIS — I5023 Acute on chronic systolic (congestive) heart failure: Secondary | ICD-10-CM | POA: Diagnosis not present

## 2022-10-16 DIAGNOSIS — Z7984 Long term (current) use of oral hypoglycemic drugs: Secondary | ICD-10-CM | POA: Diagnosis not present

## 2022-10-16 DIAGNOSIS — Z87891 Personal history of nicotine dependence: Secondary | ICD-10-CM | POA: Insufficient documentation

## 2022-10-16 DIAGNOSIS — I453 Trifascicular block: Secondary | ICD-10-CM | POA: Diagnosis not present

## 2022-10-16 DIAGNOSIS — G4733 Obstructive sleep apnea (adult) (pediatric): Secondary | ICD-10-CM | POA: Insufficient documentation

## 2022-10-16 DIAGNOSIS — E1151 Type 2 diabetes mellitus with diabetic peripheral angiopathy without gangrene: Secondary | ICD-10-CM | POA: Insufficient documentation

## 2022-10-16 DIAGNOSIS — I110 Hypertensive heart disease with heart failure: Secondary | ICD-10-CM | POA: Diagnosis not present

## 2022-10-16 DIAGNOSIS — M4800 Spinal stenosis, site unspecified: Secondary | ICD-10-CM | POA: Diagnosis not present

## 2022-10-16 DIAGNOSIS — I4819 Other persistent atrial fibrillation: Secondary | ICD-10-CM

## 2022-10-16 HISTORY — PX: CARDIOVERSION: SHX1299

## 2022-10-16 LAB — GLUCOSE, CAPILLARY: Glucose-Capillary: 103 mg/dL — ABNORMAL HIGH (ref 70–99)

## 2022-10-16 SURGERY — CARDIOVERSION
Anesthesia: General

## 2022-10-16 MED ORDER — SODIUM CHLORIDE 0.9 % IV SOLN: INTRAVENOUS | Status: DC

## 2022-10-16 MED ORDER — LIDOCAINE 2% (20 MG/ML) 5 ML SYRINGE
INTRAMUSCULAR | Status: DC | PRN
  Administered 2022-10-16: 60 mg via INTRAVENOUS

## 2022-10-16 MED ORDER — PROPOFOL 10 MG/ML IV BOLUS
INTRAVENOUS | Status: DC | PRN
  Administered 2022-10-16: 40 mg via INTRAVENOUS
  Administered 2022-10-16: 20 mg via INTRAVENOUS

## 2022-10-16 MED ORDER — SODIUM CHLORIDE 0.9 % IV SOLN: INTRAVENOUS | Status: DC | PRN

## 2022-10-16 SURGICAL SUPPLY — 1 items: ELECT DEFIB PAD ADLT CADENCE (PAD) ×1 IMPLANT

## 2022-10-16 NOTE — Anesthesia Postprocedure Evaluation (Signed)
Anesthesia Post Note  Patient: [name] [name]  Procedure(s) Performed: CARDIOVERSION     Patient location during evaluation: Cath Lab Anesthesia Type: General Level of consciousness: awake and alert Pain management: pain level controlled Vital Signs Assessment: post-procedure vital signs reviewed and stable Respiratory status: spontaneous breathing, nonlabored ventilation, respiratory function stable and patient connected to nasal cannula oxygen Cardiovascular status: blood pressure returned to baseline and stable Postop Assessment: no apparent nausea or vomiting Anesthetic complications: no   No notable events documented.  Last Vitals:  Vitals:   10/16/22 0850 10/16/22 0900  BP: (!) 122/58 128/66  Pulse: (!) 59 65  Resp: 16 15  Temp:    SpO2: 97% 98%    Last Pain:  Vitals:   10/16/22 0900  TempSrc:   PainSc: 0-No pain                 Collene Schlichter

## 2022-10-16 NOTE — Op Note (Signed)
Procedure: Electrical Cardioversion Indications:  Atrial Fibrillation  Procedure Details:  Consent: Risks of procedure as well as the alternatives and risks of each were explained to the (patient/caregiver).  Consent for procedure obtained.  Time Out: Verified patient identification, verified procedure, site/side was marked, verified correct patient position, special equipment/implants available, medications/allergies/relevent history reviewed, required imaging and test results available.  Performed  Patient placed on cardiac monitor, pulse oximetry, supplemental oxygen as necessary.  Sedation given:  Propofol 60 mg IV, Dr. Desmond Lope Pacer pads placed anterior and posterior chest.  Cardioverted 2 time(s).  Cardioversion with synchronized biphasic 150J shock (unsuccessful) and synchronized 200J shock (successful).  Evaluation: Findings: Post procedure EKG shows:  A paced BiV paced rhythm Complications: None Patient did tolerate procedure well.  Comprehensive ICD check before and after the shock -normal device function.  Time Spent Directly with the Patient:  30 minutes   [name] [name] 10/16/2022, 8:36 AM

## 2022-10-16 NOTE — Discharge Instructions (Signed)

## 2022-10-16 NOTE — Transfer of Care (Signed)
Immediate Anesthesia Transfer of Care Note  Patient: [name] [name]  Procedure(s) Performed: CARDIOVERSION  Patient Location: Cath Lab  Anesthesia Type:General  Level of Consciousness: awake, alert , and oriented  Airway & Oxygen Therapy: Patient Spontanous Breathing  Post-op Assessment: Report given to RN and Post -op Vital signs reviewed and stable  Post vital signs: Reviewed and stable  Last Vitals:  Vitals Value Taken Time  BP 137/71 10/16/22 0827  Temp 36.8 C 10/16/22 0827  Pulse 58 10/16/22 0827  Resp 17 10/16/22 0827  SpO2 99 % 10/16/22 0827    Last Pain:  Vitals:   10/16/22 0827  TempSrc: Tympanic  PainSc: 0-No pain         Complications: No notable events documented.

## 2022-10-16 NOTE — Anesthesia Preprocedure Evaluation (Addendum)
Anesthesia Evaluation  Patient identified by MRN, date of birth, ID band Patient awake    Reviewed: Allergy & Precautions, NPO status , Patient's Chart, lab work & pertinent test results, reviewed documented beta blocker date and time   Airway Mallampati: II       Dental  (+) Teeth Intact, Dental Advisory Given   Pulmonary sleep apnea , former smoker   breath sounds clear to auscultation       Cardiovascular hypertension, Pt. on home beta blockers and Pt. on medications + CAD, + CABG, + Peripheral Vascular Disease and +CHF  + dysrhythmias (RBBB) Atrial Fibrillation  Rhythm:Irregular Rate:Abnormal  Echo 12/05/21: 1. Left ventricular ejection fraction, by estimation, is 30 to 35%. The  left ventricle has moderately decreased function. The left ventricle  demonstrates regional wall motion abnormalities (see scoring  diagram/findings for description). The left  ventricular internal cavity size was mildly to moderately dilated. There  is moderate asymmetric left ventricular hypertrophy of the septal segment.  Left ventricular diastolic parameters are indeterminate.   2. Right ventricular systolic function is normal. The right ventricular  size is normal.   3. Left atrial size was mildly dilated.   4. Right atrial size was mildly dilated.   5. The mitral valve is normal in structure. Severe mitral valve  regurgitation. No evidence of mitral stenosis.   6. Tricuspid valve regurgitation is mild to moderate.   7. The aortic valve is normal in structure. Aortic valve regurgitation is  not visualized. No aortic stenosis is present.      Neuro/Psych CVA    GI/Hepatic negative GI ROS, Neg liver ROS,,,  Endo/Other  diabetes, Type 2, Oral Hypoglycemic Agents    Renal/GU negative Renal ROS     Musculoskeletal  (+) Arthritis ,    Abdominal   Peds  Hematology  (+) Blood dyscrasia (Eliquis), anemia   Anesthesia Other Findings    Reproductive/Obstetrics                             Anesthesia Physical Anesthesia Plan  ASA: 4  Anesthesia Plan: General   Post-op Pain Management: Minimal or no pain anticipated   Induction: Intravenous  PONV Risk Score and Plan: 2 and TIVA and Treatment may vary due to age or medical condition  Airway Management Planned: Mask  Additional Equipment:   Intra-op Plan:   Post-operative Plan:   Informed Consent: I have reviewed the patients History and Physical, chart, labs and discussed the procedure including the risks, benefits and alternatives for the proposed anesthesia with the patient or authorized representative who has indicated his/her understanding and acceptance.     Dental advisory given  Plan Discussed with: CRNA  Anesthesia Plan Comments:         Anesthesia Quick Evaluation

## 2022-10-18 NOTE — Interval H&P Note (Signed)
History and Physical Interval Note:  10/18/2022 5:11 PM  [name] [name]  has presented today for surgery, with the diagnosis of AFIB.  The various methods of treatment have been discussed with the patient and family. After consideration of risks, benefits and other options for treatment, the patient has consented to  Procedure(s): CARDIOVERSION (N/A) as a surgical intervention.  The patient's history has been reviewed, patient examined, no change in status, stable for surgery.  I have reviewed the patient's chart and labs.  Questions were answered to the patient's satisfaction.     Olusegun Gerstenberger

## 2022-10-19 ENCOUNTER — Telehealth: Payer: Self-pay

## 2022-10-19 NOTE — Telephone Encounter (Signed)
Spoke to patient he stated he continues to have sob after cardioversion 4/19.Appointment rescheduled to 4/25 at 2:45 pm with Micah Flesher PA.

## 2022-10-19 NOTE — Telephone Encounter (Signed)
Returned patient call as requested by voice mail.   He reports feeling weak and complains of SOB starting the day after 4/19 Cardioversion.  He was not having weakness or SOB prior to procedure.  He reports weight is stable at 224 lbs.   He is a patient of Dr [name]'s.   Pt is asking if his symptoms are normal after having a cardioversion.     No change in Optivol thoracic impedance since 4/9 and trending close to normal.  Appears to be in NSR since cardioversion.

## 2022-10-21 ENCOUNTER — Telehealth: Payer: Self-pay

## 2022-10-21 NOTE — Progress Notes (Signed)
Cardiology Office Note:    Date:  10/22/2022   ID:  [name] [name], DOB 02-19-1946, MRN [id number]  PCP:  [name], MD   [facility] Providers Cardiologist:  [name], MD Electrophysiologist:  Will Jorja Loa, MD  Sleep Medicine:  Armanda Magic, MD { Referring MD: [name], MD   Chief Complaint  Patient presents with   Follow-up    Post DCCV    History of Present Illness:    [name] [name] is a 77 y.o. male with a hx of CAD s/p CABG in 2013 with ischemic cardiomyopathy-EF 20-25% prior to surgery which improved to 40-45% by may have 2014, mild MR, PAD, DM, and PAF on chronic anticoagulation.  Recent reduction in EF led to ICD insertion in 2023.  Heart catheterization 08/2013 showed patent LIMA-LAD, SVG-PLA, SVG-PDA.  Most recent heart catheterization 12/15/2021 showed patent LIMA-LAD, but occlusion of both SVG grafts.  Given wall motion abnormality on echocardiogram, viability study was recommended to help make decisions regarding complex atherectomy and intervention of the left circumflex that would involve stenting back into the left main and covering a long segment throughout the circumflex to treat multiple severe calcific lesions.  Cardiac MRI was pursued and showed EF 28%, and severe thinning in the apical to basal lateral wall and apical inferior wall suggesting nonviability in these regions.  Therefore complex intervention was not pursued.  He was treated medically.  Given persistent reduction in EF he underwent BiV ICD insertion CRT-D 04/30/2022.  He typically follows with Dr. [name] and was last seen 10/12/2022.  In March 2024 he reported worsening shortness of breath.  Device interrogation showed possible fluid accumulation and atrial fibrillation.  Diuretic was increased and EKG showed underlying A-fib with ventricularly paced rhythm and PVCs.  He presented for scheduled outpatient cardioversion on 10/16/2022 and was successfully converted to a paced BiV  paced rhythm.  He called our office felling very poorly since cardioversion with weakness and SOB. He sent a manual transmission on 10/19/22 that showed NSR and thoracic impedence "tending close to normal."  He presents today for follow-up.   Dry weight 214 lbs Weight was 226 lbs at home on the day of cardioversion This week - weight has been 219 lbs He also reports improved ankle swelling yesterday and today.   He sent a manual transmission on 10/19/22 that showed stable thoracic that is trending close to normal. He was also in NSR.   He was placed on my schedule today.  He states he felt poorly on Monday 10/19/22 - I believe this coincided with his manual transmission that suggested NSR. We later received an alert on 10/20/22 that suggested he was back in Afib. EKG today with paced rhythm, but suspicious for Afib, when reviewed with Dr. [name].   He states he is moving in the "right direction" and is feeling a bit better each day.   Past Medical History:  Diagnosis Date   Arthritis    Knee L - probably   Atrial fibrillation (HCC)    BPH (benign prostatic hyperplasia)    CAD (coronary artery disease) 07/06/2011   CHF (congestive heart failure) (HCC)    Coronary artery disease    Diabetes mellitus    Frequency of urination    Heart failure (HCC)    High cholesterol    History of nuclear stress test 09/2010   dipyridamole; mild perfusion defect due to attenuation with mild superimposed ischemia in apical septal, apical, apical  inferior & apical lateral regions; rest LV enlarged in size; prominent gut uptake in infero-apical region; no significant ischemia demonstrated; low risk scan    HNP (herniated nucleus pulposus), lumbar    Hypertension    Ischemic cardiomyopathy    LV dysfunction 07/06/2011   Nocturia    Right bundle branch block    S/P CABG (coronary artery bypass graft) 08/03/2011   x3; LIMA to LAD,, SVG to PDA, SVG to posterolateral branch of RCA; Dr. Wayland Salinas   Sleep  apnea    sleep study 10/2010- AHI during total sleep 32.1/hr and during REM 62.3/hr (severe sleep apnea)unable to tolerate c pap   Spinal stenosis of lumbar region    Stroke [facility])    [place]     Past Surgical History:  Procedure Laterality Date   ABDOMINAL AORTOGRAM W/LOWER EXTREMITY Left 11/14/2020   Procedure: ABDOMINAL AORTOGRAM W/LOWER EXTREMITY;  Surgeon: Cephus Shelling, MD;  Location: [facility] INVASIVE CV LAB;  Service: Cardiovascular;  Laterality: Left;   ABDOMINAL AORTOGRAM W/LOWER EXTREMITY N/A 01/02/2021   Procedure: ABDOMINAL AORTOGRAM W/LOWER EXTREMITY;  Surgeon: Cephus Shelling, MD;  Location: [facility] INVASIVE CV LAB;  Service: Cardiovascular;  Laterality: N/A;   AMPUTATION Left 03/20/2021   Procedure: Left foot 5th ray amputation;  Surgeon: Toni Arthurs, MD;  Location: [facility] OR;  Service: Orthopedics;  Laterality: Left;   BIV ICD INSERTION CRT-D N/A 04/30/2022   Procedure: BIV ICD INSERTION CRT-D;  Surgeon: [name], MD;  Location: [facility] INVASIVE CV LAB;  Service: Cardiovascular;  Laterality: N/A;   CARDIAC CATHETERIZATION  01/2011   ischemic cardiomyopathy 30-35%, multivessel CAD (Dr. Bishop Limbo)    CARDIAC CATHETERIZATION  09/13/2015   Procedure: Right/Left Heart Cath and Coronary/Graft Angiography;  Surgeon: [name], MD;  Location: [facility] INVASIVE CV LAB;  Service: Cardiovascular;;   CARDIOVERSION N/A 05/16/2019   Procedure: CARDIOVERSION;  Surgeon: [name], MD;  Location: [facility] ENDOSCOPY;  Service: Cardiovascular;  Laterality: N/A;   CARDIOVERSION N/A 10/16/2022   Procedure: CARDIOVERSION;  Surgeon: Thurmon Fair, MD;  Location: [facility] INVASIVE CV LAB;  Service: Cardiovascular;  Laterality: N/A;   Colonscopy     CORONARY ARTERY BYPASS GRAFT  08/03/2011   Procedure: CORONARY ARTERY BYPASS GRAFTING (CABG);  Surgeon: Alleen Borne, MD;  Location: [facility] OR;  Service: Open Heart Surgery;  Laterality: N/A;  CABG times three using right saphenous vein and left mammary artery  usisng endoscope.   LEFT HEART CATH AND CORS/GRAFTS ANGIOGRAPHY N/A 12/15/2021   Procedure: LEFT HEART CATH AND CORS/GRAFTS ANGIOGRAPHY;  Surgeon: Tonny Bollman, MD;  Location: [facility] INVASIVE CV LAB;  Service: Cardiovascular;  Laterality: N/A;   LEFT HEART CATHETERIZATION WITH CORONARY/GRAFT ANGIOGRAM N/A 09/20/2013   Procedure: LEFT HEART CATHETERIZATION WITH Isabel Caprice;  Surgeon: [name], MD;  Location: [facility] CATH LAB;  Service: Cardiovascular;  Laterality: N/A;   Lower Extremity Arterial Doppler  03/16/2013   bilat ABIs demonstated normal values; R runoff - posterior tibial & anterior tibial arteries occluded; L runoff - peroneal & posterior tibial arteries occluded, anterior tibial artery appears occluded   LUMBAR LAMINECTOMY/DECOMPRESSION MICRODISCECTOMY N/A 09/12/2014   Procedure: LUMBAR DECOMPRESSION L3-L4, L4-L5, MICRODISCECTOMY L4-L5;  Surgeon: Jene Every, MD;  Location: [facility] ORS;  Service: Orthopedics;  Laterality: N/A;   PERIPHERAL VASCULAR BALLOON ANGIOPLASTY  11/14/2020   Procedure: PERIPHERAL VASCULAR BALLOON ANGIOPLASTY;  Surgeon: Cephus Shelling, MD;  Location: [facility] INVASIVE CV LAB;  Service: Cardiovascular;;   PERIPHERAL VASCULAR BALLOON ANGIOPLASTY Left 01/02/2021   Procedure:  PERIPHERAL VASCULAR BALLOON ANGIOPLASTY;  Surgeon: Cephus Shelling, MD;  Location: [facility] INVASIVE CV LAB;  Service: Cardiovascular;  Laterality: Left;  Failed PTA   Pilonidal Cyst removed     TRANSTHORACIC ECHOCARDIOGRAM  11/10/2012   EF 40-45%, mild LVH, mild hypokinesis of anteroseptal myocardium, grade 1 diastolic dysfunction; mild MR & calcifed mitral annulus; LA mild-mod dilated; RA mildly dilated    Current Medications: Current Meds  Medication Sig   acetaminophen (TYLENOL) 500 MG tablet Take 500-1,000 mg by mouth every 6 (six) hours as needed (for pain.).   aspirin EC 81 MG tablet Take 81 mg by mouth at bedtime. Swallow whole.   carvedilol (COREG) 3.125 MG tablet Take 1 tablet  (3.125 mg total) by mouth 2 (two) times daily with a meal.   ELIQUIS 5 MG TABS tablet Take 1 tablet by mouth twice daily   empagliflozin (JARDIANCE) 25 MG TABS tablet Take 1 tablet (25 mg total) by mouth daily.   furosemide (LASIX) 40 MG tablet Take 1 1/2 tablet by mouth in the morning and 1 tablet by mouth in the afternoon for 4 days then go back down to 1 tablet by mouth twice a day   gabapentin (NEURONTIN) 300 MG capsule Take 600 mg by mouth at bedtime.   glipiZIDE (GLUCOTROL XL) 10 MG 24 hr tablet Take 10 mg by mouth at bedtime.   metFORMIN (GLUCOPHAGE-XR) 500 MG 24 hr tablet Take 1,000 mg by mouth 2 (two) times daily.   ONETOUCH VERIO test strip 1 each daily.   rosuvastatin (CRESTOR) 10 MG tablet Take 1 tablet (10 mg total) by mouth every other day.   [DISCONTINUED] furosemide (LASIX) 40 MG tablet Take 40 mg by mouth in the morning and at bedtime.   [DISCONTINUED] rosuvastatin (CRESTOR) 20 MG tablet Take 10 mg by mouth every other day. In the evening.     Allergies:   Entresto [sacubitril-valsartan] and Sacubitril   Social History   Socioeconomic History   Marital status: Widowed    Spouse name: Not on file   Number of children: 1   Years of education: Not on file   Highest education level: Not on file  Occupational History   Occupation: real Psychologist, occupational  Tobacco Use   Smoking status: Former    Types: Cigars    Quit date: 09/07/2011    Years since quitting: 11.1   Smokeless tobacco: Former    Quit date: 02/28/2012  Substance and Sexual Activity   Alcohol use: Yes    Alcohol/week: 1.0 - 2.0 standard drink of alcohol    Types: 1 - 2 Cans of beer per week   Drug use: No   Sexual activity: Not on file  Other Topics Concern   Not on file  Social History Narrative   ** Merged History Encounter **       Social Determinants of Health   Financial Resource Strain: Not on file  Food Insecurity: Not on file  Transportation Needs: Not on file  Physical Activity: Not on file   Stress: Not on file  Social Connections: Not on file     Family History: The patient's family history includes Anesthesia problems in his daughter; Cancer in his mother; Colon cancer in his sister; Colon polyps in his father; Lung disease in his father; Sudden death in his maternal grandmother.  ROS:   Please see the history of present illness.     All other systems reviewed and are negative.  EKGs/Labs/Other Studies Reviewed:  The following studies were reviewed today:  Echo 12/05/21:  1. Left ventricular ejection fraction, by estimation, is 30 to 35%. The  left ventricle has moderately decreased function. The left ventricle  demonstrates regional wall motion abnormalities (see scoring  diagram/findings for description). The left  ventricular internal cavity size was mildly to moderately dilated. There  is moderate asymmetric left ventricular hypertrophy of the septal segment.  Left ventricular diastolic parameters are indeterminate.   2. Right ventricular systolic function is normal. The right ventricular  size is normal.   3. Left atrial size was mildly dilated.   4. Right atrial size was mildly dilated.   5. The mitral valve is normal in structure. Severe mitral valve  regurgitation. No evidence of mitral stenosis.   6. Tricuspid valve regurgitation is mild to moderate.   7. The aortic valve is normal in structure. Aortic valve regurgitation is  not visualized. No aortic stenosis is present.     EKG:  EKG is  ordered today.  The ekg ordered today demonstrates: V-paced rhythm, HR 58  Recent Labs: 11/17/2021: ALT 13 10/02/2022: BNP 323.2 10/12/2022: BUN 17; Creatinine, Ser 1.04; Hemoglobin 12.1; Platelets 182; Potassium 4.8; Sodium 144  Recent Lipid Panel    Component Value Date/Time   CHOL 98 (L) 12/10/2021 0832   TRIG 88 12/10/2021 0832   HDL 35 (L) 12/10/2021 0832   CHOLHDL 2.8 12/10/2021 0832   LDLCALC 46 12/10/2021 0832     Risk Assessment/Calculations:     CHA2DS2-VASc Score = 6   This indicates a 9.7% annual risk of stroke. The patient's score is based upon: CHF History: 1 HTN History: 1 Diabetes History: 1 Stroke History: 0 Vascular Disease History: 1 Age Score: 2 Gender Score: 0      Physical Exam:    VS:  BP (!) 116/50   Pulse (!) 58   Ht  (1.88 m)   Wt 219 lb (99.3 kg)   SpO2 97%   BMI 28.12 kg/m     Wt Readings from Last 3 Encounters:  10/22/22 219 lb (99.3 kg)  10/16/22 220 lb (99.8 kg)  10/12/22 224 lb 3.2 oz (101.7 kg)     GEN:  Well nourished, well developed in no acute distress HEENT: Normal NECK: No JVD; No carotid bruits LYMPHATICS: No lymphadenopathy CARDIAC: RRR, no murmurs, rubs, gallops RESPIRATORY:  Clear to auscultation without rales, wheezing or rhonchi  ABDOMEN: Soft, non-tender, non-distended MUSCULOSKELETAL:  mild B LE edema; No deformity  SKIN: Warm and dry NEUROLOGIC:  Alert and oriented x 3 PSYCHIATRIC:  Normal affect   ASSESSMENT:    1. Atrial fibrillation, unspecified type   2. Chronic systolic (congestive) heart failure   3. Chronic anticoagulation   4. S/P CABG x 3   5. Primary hypertension   6. Hyperlipidemia with target LDL less than 70   7. OSA (obstructive sleep apnea)   8. Mitral valve insufficiency, unspecified etiology    PLAN:    In order of problems listed above:  Chronic systolic heart failure EF 28% by CT MRI 12/2021 CRT-D inserted 2023 GDMT: coreg 3.125 mg BID, jardiance 25 mg, 40 mg laisx BID Increase lasix to 60 mg qAM and 40 mg qPM x 4 days.    Atrial fibrillation DCCV 04/2019 DCCV 09/2022 Continue coreg 3.125 mg BID ICD transmission suggests he had an early return to Afib after his cardioversion, may explain his symptoms. Will refer back to Dr. [name] for full review of his device and next  steps RE AAT.   Chronic anticoagulation Continue eliquis   CAD s/p CABG 2013 Last heart catheterization 11/2021 with occluded SVG-PDA and SVG-PLA Patent  LIMA-LAD Treated medically   Hypertension Managed in the context of ICM   Hyperlipidemia with LDL goal < 70 12/10/2021: Cholesterol, Total 98; HDL 35; LDL Chol Calc (NIH) 46; Triglycerides 88 Continue 10 mg crestor every other day   OSA on BiPAP Compliant    Severe MR Will repeat an echo - may be contributing to his symptoms   Medication Adjustments/Labs and Tests Ordered: Current medicines are reviewed at length with the patient today.  Concerns regarding medicines are outlined above.  Orders Placed This Encounter  Procedures   EKG 12-Lead   ECHOCARDIOGRAM COMPLETE   Meds ordered this encounter  Medications   rosuvastatin (CRESTOR) 10 MG tablet    Sig: Take 1 tablet (10 mg total) by mouth every other day.    Dispense:  50 tablet    Refill:  3   furosemide (LASIX) 40 MG tablet    Sig: Take 1 1/2 tablet by mouth in the morning and 1 tablet by mouth in the afternoon for 4 days then go back down to 1 tablet by mouth twice a day    Dispense:  180 tablet    Refill:  3    Patient Instructions  Medication Instructions:  Your physician has recommended you make the following change in your medication:   INCREASE the Lasix to 1 1/2 tablet in the a.m 1 tablet in the pm for 4 days then reduce it back to 1 tablet twice a day  *If you need a refill on your cardiac medications before your next appointment, please call your pharmacy*   Lab Work: None ordered  If you have labs (blood work) drawn today and your tests are completely normal, you will receive your results only by: MyChart Message (if you have MyChart) OR A paper copy in the mail If you have any lab test that is abnormal or we need to change your treatment, we will call you to review the results.   Testing/Procedures: Your physician has requested that you have an echocardiogram WITHIN THE NEXT 2 WEEKS WHEREVER WE CAN GET IT.   Echocardiography is a painless test that uses sound waves to create images of your heart.  It provides your doctor with information about the size and shape of your heart and how well your heart's chambers and valves are working. This procedure takes approximately one hour. There are no restrictions for this procedure. Please do NOT wear cologne, perfume, aftershave, or lotions (deodorant is allowed). Please arrive 15 minutes prior to your appointment time.    Follow-Up: At [facility], you and your health needs are our priority.  As part of our continuing mission to provide you with exceptional heart care, we have created designated Provider Care Teams.  These Care Teams include your primary Cardiologist (physician) and Advanced Practice Providers (APPs -  Physician Assistants and Nurse Practitioners) who all work together to provide you with the care you need, when you need it.  We recommend signing up for the patient portal called "MyChart".  Sign up information is provided on this After Visit Summary.  MyChart is used to connect with patients for Virtual Visits (Telemedicine).  Patients are able to view lab/test results, encounter notes, upcoming appointments, etc.  Non-urgent messages can be sent to your provider as well.   To learn more about what you can do  with MyChart, go to [email or web address].    Your next appointment:   3 week(s)  Per Karoline Caldwell, send Hayley a message for an appointment if needed, pt needs to see Dr. [name]  Provider:   [name], MD     Other Instructions  PLEASE SEND A MANUAL TRANSMISSION ON MONDAY 10/26/22   Signed, Roe Rutherford Versie Fleener, PA  10/22/2022 6:13 PM    [facility]

## 2022-10-21 NOTE — Telephone Encounter (Signed)
Following alert received from [employer] received for Device alert for AF starting 4/23 @ 15:42, s/p DCCV 4/19 Rates controlled, Eliquis per EPIC.  Has f/u with Micah Flesher, PA post DCCV 10/22/22.

## 2022-10-22 ENCOUNTER — Encounter: Payer: Self-pay | Admitting: Physician Assistant

## 2022-10-22 ENCOUNTER — Ambulatory Visit: Payer: Medicare Other | Attending: Physician Assistant | Admitting: Physician Assistant

## 2022-10-22 VITALS — BP 116/50 | HR 58 | Ht 74.0 in | Wt 219.0 lb

## 2022-10-22 DIAGNOSIS — I4891 Unspecified atrial fibrillation: Secondary | ICD-10-CM | POA: Diagnosis not present

## 2022-10-22 DIAGNOSIS — G4733 Obstructive sleep apnea (adult) (pediatric): Secondary | ICD-10-CM | POA: Diagnosis not present

## 2022-10-22 DIAGNOSIS — Z951 Presence of aortocoronary bypass graft: Secondary | ICD-10-CM | POA: Diagnosis not present

## 2022-10-22 DIAGNOSIS — I10 Essential (primary) hypertension: Secondary | ICD-10-CM | POA: Diagnosis not present

## 2022-10-22 DIAGNOSIS — Z7901 Long term (current) use of anticoagulants: Secondary | ICD-10-CM | POA: Diagnosis not present

## 2022-10-22 DIAGNOSIS — E785 Hyperlipidemia, unspecified: Secondary | ICD-10-CM

## 2022-10-22 DIAGNOSIS — I340 Nonrheumatic mitral (valve) insufficiency: Secondary | ICD-10-CM | POA: Diagnosis not present

## 2022-10-22 DIAGNOSIS — I5022 Chronic systolic (congestive) heart failure: Secondary | ICD-10-CM | POA: Insufficient documentation

## 2022-10-22 MED ORDER — FUROSEMIDE 40 MG PO TABS: ORAL_TABLET | ORAL | 3 refills | Status: DC

## 2022-10-22 MED ORDER — ROSUVASTATIN CALCIUM 10 MG PO TABS: 10.0000 mg | ORAL_TABLET | ORAL | 3 refills | Status: DC

## 2022-10-22 NOTE — Patient Instructions (Addendum)
Medication Instructions:  Your physician has recommended you make the following change in your medication:   INCREASE the Lasix to 1 1/2 tablet in the a.m 1 tablet in the pm for 4 days then reduce it back to 1 tablet twice a day  *If you need a refill on your cardiac medications before your next appointment, please call your pharmacy*   Lab Work: None ordered  If you have labs (blood work) drawn today and your tests are completely normal, you will receive your results only by: MyChart Message (if you have MyChart) OR A paper copy in the mail If you have any lab test that is abnormal or we need to change your treatment, we will call you to review the results.   Testing/Procedures: Your physician has requested that you have an echocardiogram WITHIN THE NEXT 2 WEEKS WHEREVER WE CAN GET IT.   Echocardiography is a painless test that uses sound waves to create images of your heart. It provides your doctor with information about the size and shape of your heart and how well your heart's chambers and valves are working. This procedure takes approximately one hour. There are no restrictions for this procedure. Please do NOT wear cologne, perfume, aftershave, or lotions (deodorant is allowed). Please arrive 15 minutes prior to your appointment time.    Follow-Up: At [facility], you and your health needs are our priority.  As part of our continuing mission to provide you with exceptional heart care, we have created designated Provider Care Teams.  These Care Teams include your primary Cardiologist (physician) and Advanced Practice Providers (APPs -  Physician Assistants and Nurse Practitioners) who all work together to provide you with the care you need, when you need it.  We recommend signing up for the patient portal called "MyChart".  Sign up information is provided on this After Visit Summary.  MyChart is used to connect with patients for Virtual Visits (Telemedicine).  Patients are  able to view lab/test results, encounter notes, upcoming appointments, etc.  Non-urgent messages can be sent to your provider as well.   To learn more about what you can do with MyChart, go to [email or web address].    Your next appointment:   3 week(s)  Per Karoline Caldwell, send Hayley a message for an appointment if needed, pt needs to see Dr. [name]  Provider:   [name], MD     Other Instructions  PLEASE SEND A MANUAL TRANSMISSION ON [facility] 10/26/22

## 2022-10-26 ENCOUNTER — Telehealth: Payer: Self-pay

## 2022-10-26 NOTE — Telephone Encounter (Signed)
Returned patient call as requested by voice mail message.  Pt was instructed at 4/24 OV with Micah Flesher, PA to send in updated remote transmission today since alert 4/23 regarding AT/AF.    Pt has a follow up with Dr [name] on 4/30 regarding AT/AF.    Advised will send copy to Micah Flesher, PA regarding todays report.  Explained that any recommendations regarding AT/AF will be given at 4/30 OV with Dr [name].    Report sent to [facility] Triage as FYI.    10/26/2022 Carelink report:  Since 20-Oct-2022 AT/AF  1 Time in AT/AF   24.0 hr/day (100.0%) Longest AT/AF  6 days

## 2022-10-26 NOTE — Telephone Encounter (Signed)
Has apt with Dr. [name] 10/27/22. Patient called and aware.

## 2022-10-27 ENCOUNTER — Encounter: Payer: Self-pay | Admitting: Cardiology

## 2022-10-27 ENCOUNTER — Ambulatory Visit: Payer: Medicare Other | Attending: Cardiology | Admitting: Cardiology

## 2022-10-27 VITALS — BP 110/58 | HR 68 | Ht 74.0 in | Wt 217.0 lb

## 2022-10-27 DIAGNOSIS — I4819 Other persistent atrial fibrillation: Secondary | ICD-10-CM | POA: Diagnosis not present

## 2022-10-27 DIAGNOSIS — Z01812 Encounter for preprocedural laboratory examination: Secondary | ICD-10-CM

## 2022-10-27 DIAGNOSIS — I10 Essential (primary) hypertension: Secondary | ICD-10-CM | POA: Diagnosis not present

## 2022-10-27 DIAGNOSIS — D6869 Other thrombophilia: Secondary | ICD-10-CM

## 2022-10-27 DIAGNOSIS — I5022 Chronic systolic (congestive) heart failure: Secondary | ICD-10-CM

## 2022-10-27 MED ORDER — AMIODARONE HCL 200 MG PO TABS: ORAL_TABLET | ORAL | 2 refills | Status: DC

## 2022-10-27 NOTE — Progress Notes (Signed)
Electrophysiology Office Note   Date:  10/27/2022   ID:  Sampson, Self 1945/11/16, MRN [id number]  PCP:  [name], MD  Cardiologist:  [name] Primary Electrophysiologist:  Salvator Seppala Jorja Loa, MD    Chief Complaint: CHF   History of Present Illness: ROYE GUSTAFSON is a 77 y.o. male who is being seen today for the evaluation of CHF at the request of [name], [name], MD. Presenting today for electrophysiology evaluation.  He has a history significant for coronary artery disease post CABG, chronic systolic heart failure, hypertension, diabetes, sleep apnea, hyperlipidemia.  Cardiac MRI showed thinning and akinesis of the inferior wall but no scar.  He is post Medtronic CRT-D implanted 04/30/2022.  He has developed atrial fibrillation.  He had a cardioversion but unfortunately did go back into atrial fibrillation.  Today, denies symptoms of palpitations, chest pain, orthopnea, PND, lower extremity edema, claudication, dizziness, presyncope, syncope, bleeding, or neurologic sequela. The patient is tolerating medications without difficulties.  His main symptoms today are weakness, fatigue, shortness of breath.  He felt well over the weekend, but has developed recurrent symptoms.  He attributes this to his atrial fibrillation.  He would like a rhythm control strategy.   Past Medical History:  Diagnosis Date   Arthritis    Knee L - probably   Atrial fibrillation (HCC)    BPH (benign prostatic hyperplasia)    CAD (coronary artery disease) 07/06/2011   CHF (congestive heart failure) (HCC)    Coronary artery disease    Diabetes mellitus    Frequency of urination    Heart failure (HCC)    High cholesterol    History of nuclear stress test 09/2010   dipyridamole; mild perfusion defect due to attenuation with mild superimposed ischemia in apical septal, apical, apical inferior & apical lateral regions; rest LV enlarged in size; prominent gut uptake in infero-apical region; no  significant ischemia demonstrated; low risk scan    HNP (herniated nucleus pulposus), lumbar    Hypertension    Ischemic cardiomyopathy    LV dysfunction 07/06/2011   Nocturia    Right bundle branch block    S/P CABG (coronary artery bypass graft) 08/03/2011   x3; LIMA to LAD,, SVG to PDA, SVG to posterolateral branch of RCA; Dr. Wayland Salinas   Sleep apnea    sleep study 10/2010- AHI during total sleep 32.1/hr and during REM 62.3/hr (severe sleep apnea)unable to tolerate c pap   Spinal stenosis of lumbar region    Stroke [facility])    [place]    Past Surgical History:  Procedure Laterality Date   ABDOMINAL AORTOGRAM W/LOWER EXTREMITY Left 11/14/2020   Procedure: ABDOMINAL AORTOGRAM W/LOWER EXTREMITY;  Surgeon: Cephus Shelling, MD;  Location: [facility] INVASIVE CV LAB;  Service: Cardiovascular;  Laterality: Left;   ABDOMINAL AORTOGRAM W/LOWER EXTREMITY N/A 01/02/2021   Procedure: ABDOMINAL AORTOGRAM W/LOWER EXTREMITY;  Surgeon: Cephus Shelling, MD;  Location: [facility] INVASIVE CV LAB;  Service: Cardiovascular;  Laterality: N/A;   AMPUTATION Left 03/20/2021   Procedure: Left foot 5th ray amputation;  Surgeon: Toni Arthurs, MD;  Location: [facility] OR;  Service: Orthopedics;  Laterality: Left;   BIV ICD INSERTION CRT-D N/A 04/30/2022   Procedure: BIV ICD INSERTION CRT-D;  Surgeon: [name], MD;  Location: [facility] INVASIVE CV LAB;  Service: Cardiovascular;  Laterality: N/A;   CARDIAC CATHETERIZATION  01/2011   ischemic cardiomyopathy 30-35%, multivessel CAD (Dr. Bishop Limbo)    CARDIAC CATHETERIZATION  09/13/2015   Procedure:  Right/Left Heart Cath and Coronary/Graft Angiography;  Surgeon: [name], MD;  Location: [facility] INVASIVE CV LAB;  Service: Cardiovascular;;   CARDIOVERSION N/A 05/16/2019   Procedure: CARDIOVERSION;  Surgeon: [name], MD;  Location: [facility] ENDOSCOPY;  Service: Cardiovascular;  Laterality: N/A;   CARDIOVERSION N/A 10/16/2022   Procedure: CARDIOVERSION;  Surgeon: Thurmon Fair,  MD;  Location: [facility] INVASIVE CV LAB;  Service: Cardiovascular;  Laterality: N/A;   Colonscopy     CORONARY ARTERY BYPASS GRAFT  08/03/2011   Procedure: CORONARY ARTERY BYPASS GRAFTING (CABG);  Surgeon: Alleen Borne, MD;  Location: [facility] OR;  Service: Open Heart Surgery;  Laterality: N/A;  CABG times three using right saphenous vein and left mammary artery usisng endoscope.   LEFT HEART CATH AND CORS/GRAFTS ANGIOGRAPHY N/A 12/15/2021   Procedure: LEFT HEART CATH AND CORS/GRAFTS ANGIOGRAPHY;  Surgeon: Tonny Bollman, MD;  Location: [facility] INVASIVE CV LAB;  Service: Cardiovascular;  Laterality: N/A;   LEFT HEART CATHETERIZATION WITH CORONARY/GRAFT ANGIOGRAM N/A 09/20/2013   Procedure: LEFT HEART CATHETERIZATION WITH Isabel Caprice;  Surgeon: [name], MD;  Location: [facility] CATH LAB;  Service: Cardiovascular;  Laterality: N/A;   Lower Extremity Arterial Doppler  03/16/2013   bilat ABIs demonstated normal values; R runoff - posterior tibial & anterior tibial arteries occluded; L runoff - peroneal & posterior tibial arteries occluded, anterior tibial artery appears occluded   LUMBAR LAMINECTOMY/DECOMPRESSION MICRODISCECTOMY N/A 09/12/2014   Procedure: LUMBAR DECOMPRESSION L3-L4, L4-L5, MICRODISCECTOMY L4-L5;  Surgeon: Jene Every, MD;  Location: [facility] ORS;  Service: Orthopedics;  Laterality: N/A;   PERIPHERAL VASCULAR BALLOON ANGIOPLASTY  11/14/2020   Procedure: PERIPHERAL VASCULAR BALLOON ANGIOPLASTY;  Surgeon: Cephus Shelling, MD;  Location: [facility] INVASIVE CV LAB;  Service: Cardiovascular;;   PERIPHERAL VASCULAR BALLOON ANGIOPLASTY Left 01/02/2021   Procedure: PERIPHERAL VASCULAR BALLOON ANGIOPLASTY;  Surgeon: Cephus Shelling, MD;  Location: [facility] INVASIVE CV LAB;  Service: Cardiovascular;  Laterality: Left;  Failed PTA   Pilonidal Cyst removed     TRANSTHORACIC ECHOCARDIOGRAM  11/10/2012   EF 40-45%, mild LVH, mild hypokinesis of anteroseptal myocardium, grade 1 diastolic dysfunction; mild MR &  calcifed mitral annulus; LA mild-mod dilated; RA mildly dilated     Current Outpatient Medications  Medication Sig Dispense Refill   acetaminophen (TYLENOL) 500 MG tablet Take 500-1,000 mg by mouth every 6 (six) hours as needed (for pain.).     amiodarone (PACERONE) 200 MG tablet Take 2 tablets (400 mg total) TWICE a day for 2 weeks, then take 1 tablet (200 mg total) TWICE a day for 2 weeks, then take 1 tablet (200 mg total) ONCE a day thereafter 90 tablet 2   aspirin EC 81 MG tablet Take 81 mg by mouth at bedtime. Swallow whole.     carvedilol (COREG) 3.125 MG tablet Take 1 tablet (3.125 mg total) by mouth 2 (two) times daily with a meal. 180 tablet 3   ELIQUIS 5 MG TABS tablet Take 1 tablet by mouth twice daily 180 tablet 1   empagliflozin (JARDIANCE) 25 MG TABS tablet Take 1 tablet (25 mg total) by mouth daily. 90 tablet 3   furosemide (LASIX) 40 MG tablet Take 1 1/2 tablet by mouth in the morning and 1 tablet by mouth in the afternoon for 4 days then go back down to 1 tablet by mouth twice a day 180 tablet 3   gabapentin (NEURONTIN) 300 MG capsule Take 600 mg by mouth at bedtime.     glipiZIDE (GLUCOTROL XL) 10 MG  24 hr tablet Take 10 mg by mouth at bedtime.     metFORMIN (GLUCOPHAGE-XR) 500 MG 24 hr tablet Take 1,000 mg by mouth 2 (two) times daily.     ONETOUCH VERIO test strip 1 each daily.     rosuvastatin (CRESTOR) 10 MG tablet Take 1 tablet (10 mg total) by mouth every other day. 50 tablet 3   No current facility-administered medications for this visit.    Allergies:   Entresto [sacubitril-valsartan] and Sacubitril   Social History:  The patient  reports that he quit smoking about 11 years ago. His smoking use included cigars. He quit smokeless tobacco use about 10 years ago. He reports current alcohol use of about 1.0 - 2.0 standard drink of alcohol per week. He reports that he does not use drugs.   Family History:  The patient's family history includes Anesthesia problems in his  daughter; Cancer in his mother; Colon cancer in his sister; Colon polyps in his father; Lung disease in his father; Sudden death in his maternal grandmother.   ROS:  Please see the history of present illness.   Otherwise, review of systems is positive for none.   All other systems are reviewed and negative.   PHYSICAL EXAM: VS:  BP (!) 110/58   Pulse 68   Ht 6\' 2"  (1.88 m)   Wt 217 lb (98.4 kg)   SpO2 97%   BMI 27.86 kg/m  , BMI Body mass index is 27.86 kg/m. GEN: Well nourished, well developed, in no acute distress  HEENT: normal  Neck: no JVD, carotid bruits, or masses Cardiac: RRR; no murmurs, rubs, or gallops,no edema  Respiratory:  clear to auscultation bilaterally, normal work of breathing GI: soft, nontender, nondistended, + BS MS: no deformity or atrophy  Skin: warm and dry, device site well healed Neuro:  Strength and sensation are intact Psych: euthymic mood, full affect  EKG:  EKG is not ordered today. Personal review of the ekg ordered 10/22/22 shows AF, V paced  Personal review of the device interrogation today. Results in Paceart   Recent Labs: 11/17/2021: ALT 13 10/02/2022: BNP 323.2 10/12/2022: BUN 17; Creatinine, Ser 1.04; Hemoglobin 12.1; Platelets 182; Potassium 4.8; Sodium 144    Lipid Panel     Component Value Date/Time   CHOL 98 (L) 12/10/2021 0832   TRIG 88 12/10/2021 0832   HDL 35 (L) 12/10/2021 0832   CHOLHDL 2.8 12/10/2021 0832   LDLCALC 46 12/10/2021 0832     Wt Readings from Last 3 Encounters:  10/27/22 217 lb (98.4 kg)  10/22/22 219 lb (99.3 kg)  10/16/22 220 lb (99.8 kg)      Other studies Reviewed: Additional studies/ records that were reviewed today include: LHC 12/15/21  Review of the above records today demonstrates:  1.  Severe native coronary artery disease with patency of the left main, total occlusion of the LAD just after the first diagonal, multiple severe calcific stenoses throughout the entirety of the circumflex  distribution, and moderate stenoses in the RCA 2.  Status post CABG with continued patency of the LIMA to LAD, and interval occlusion of the SVG to PDA and SVG to PLA 3.  Normal LVEDP  Cardiac MRI 01/20/2022 1. Severe LV dilatation with severe systolic dysfunction (EF 28%). Thinning/akinesis of inferior and lateral walls.   2.  Mild RV dilatation with mild systolic dysfunction (EF 40%)   3. While no late gadolinium enhancement is seen, there is severe thinning (less than 4.47mm) in the  apical to basal lateral wall and apical inferior wall, suggesting nonviability in these regions.   4.  Moderate mitral regurgitation (regurgitant fraction 27%)   5.  Moderate tricuspid regurgitation (regurgitant fraction 25%)  ASSESSMENT AND PLAN:  1.  Chronic systolic heart failure: Due to ischemic cardiomyopathy.  Currently on optimal medical therapy per primary cardiology.  Is status post Medtronic CRT-D implanted 06/30/2021.  Sensing, threshold, impedance within normal limits.  2.  Coronary disease: Post CABG.  No current chest pain.  3.  Hypertension: Well-controlled  4.  Hyperlipidemia: Continue statin per primary cardiology  5.  Persistent atrial fibrillation: Noted March 2024.  Had a cardioversion but unfortunately back in atrial fibrillation.  Currently on Eliquis.  CHA2DS2-VASc of at least 5.  He has had more frequent episodes of atrial fibrillation.  He would prefer a rhythm control strategy and would like to avoid antiarrhythmics.  Nona Gracey plan for ablation.  In the interim, we Royetta Probus plan to start amiodarone and cardiovert as he is feeling quite poorly in atrial fibrillation.  Risk, benefits, and alternatives to EP study and radiofrequency ablation for afib were also discussed in detail today. These risks include but are not limited to stroke, bleeding, vascular damage, tamponade, perforation, damage to the esophagus, lungs, and other structures, pulmonary vein stenosis, worsening renal function, and  death. The patient understands these risk and wishes to proceed.  We Denyla Cortese therefore proceed with catheter ablation at the next available time.  Carto, ICE, anesthesia are requested for the procedure.  Lochlyn Zullo also obtain CT PV protocol prior to the procedure to exclude LAA thrombus and further evaluate atrial anatomy.   Case discussed with primary cardiology  Current medicines are reviewed at length with the patient today.   The patient does not have concerns regarding his medicines.  The following changes were made today:  start amiodarone  Labs/ tests ordered today include:  Orders Placed This Encounter  Procedures   CT CARDIAC MORPH/PULM VEIN W/CM&W/O CA SCORE     Disposition:   FU 5 months  Signed, Avangelina Flight Jorja Loa, MD  10/27/2022 4:09 PM     [facility] [address] [place] [address] [phone] (office) [phone] (fax)

## 2022-10-27 NOTE — Patient Instructions (Addendum)
Medication Instructions:  Your physician has recommended you make the following change in your medication:  START Amiodarone  - take 2 tablets (400 mg total) TWICE a day for 2 weeks, then  - take 1 tablet (200 mg total) TWICE a day for 2 weeks, then  - take 1 tablet (200 mg total) ONCE a day   *If you need a refill on your cardiac medications before your next appointment, please call your pharmacy*   Lab Work: Pre procedure labs -- see procedure instruction letter:  BMP & CBC  If you have labs (blood work) drawn today and your tests are completely normal, you will receive your results only by: MyChart Message (if you have MyChart) OR A paper copy in the mail If you have any lab test that is abnormal or we need to change your treatment, we will call you to review the results.   Testing/Procedures: Your physician has recommended that you have a Cardioversion (DCCV). Electrical Cardioversion uses a jolt of electricity to your heart either through paddles or wired patches attached to your chest. This is a controlled, usually prescheduled, procedure. Defibrillation is done under light anesthesia in the hospital, and you usually go home the day of the procedure. This is done to get your heart back into a normal rhythm. You are not awake for the procedure. Please see the instructions below located under other instructions.   Your physician has requested that you have cardiac CT within 7 days PRIOR to your ablation. Cardiac computed tomography (CT) is a painless test that uses an x-ray machine to take clear, detailed pictures of your heart.  Please follow instruction below located under "other instructions". You will get a call from our office to schedule the date for this test.  Your physician has recommended that you have an ablation. Catheter ablation is a medical procedure used to treat some cardiac arrhythmias (irregular heartbeats). During catheter ablation, a long, thin, flexible tube is  put into a blood vessel in your groin (upper thigh), or neck. This tube is called an ablation catheter. It is then guided to your heart through the blood vessel. Radio frequency waves destroy small areas of heart tissue where abnormal heartbeats may cause an arrhythmia to start. Please follow instruction letter given to you today.  You will be scheduled for 01/19/23.  We will contact you at a later date to go over CT & Ablation instructions, and will send them via mychart.   Follow-Up: At [facility], you and your health needs are our priority.  As part of our continuing mission to provide you with exceptional heart care, we have created designated Provider Care Teams.  These Care Teams include your primary Cardiologist (physician) and Advanced Practice Providers (APPs -  Physician Assistants and Nurse Practitioners) who all work together to provide you with the care you need, when you need it.  Your next appointment:   1 month(s) after your ablation  The format for your next appointment:   In Person  Provider:   AFib clinic   Thank you for choosing [facility]!!   Dory Horn, RN [phone]    Other Instructions      Dear [name] [name]  You are scheduled for a Cardioversion on Monday, May 20 with Dr. Jens Som.  Please arrive at the [facility] (Main Entrance A) at [facility]: [address] [place], [place] [address] at 8:00 AM (This time is 1 hour(s) before your procedure to ensure your  preparation). Free valet parking service is available. You will check in at ADMITTING. The support person will be asked to wait in the waiting room.  It is OK to have someone drop you off and come back when you are ready to be discharged.     DIET:  Nothing to eat or drink after midnight except a sip of water with medications (see medication instructions below)  MEDICATION INSTRUCTIONS: !!IF ANY NEW MEDICATIONS ARE STARTED AFTER TODAY, PLEASE NOTIFY YOUR PROVIDER AS SOON AS  POSSIBLE!!  FYI: Medications such as Semaglutide (Ozempic, [place]), Tirzepatide (Mounjaro, Zepbound), Dulaglutide (Trulicity), etc ("GLP1 agonists") must be held around the time of a procedure. Talk to your provider if you take one of these.  Hold Jardiance for 3 days prior to this procedure. Hold your morning medications, take ONLY your Eliquis.  Continue taking your anticoagulant (blood thinner): Apixaban (Eliquis).  You will need to continue this after your procedure until you are told by your provider that it is safe to stop.    LABS:  Your labs will be done at the hospital prior to your procedure   FYI:  For your safety, and to allow [place] to monitor your vital signs accurately during the surgery/procedure we request: If you have artificial nails, gel coating, SNS etc, please have those removed prior to your surgery/procedure. Not having the nail coverings /polish removed may result in cancellation or delay of your surgery/procedure.  You must have a responsible person to drive you home and stay in the waiting area during your procedure. Failure to do so could result in cancellation.  Bring your insurance cards.  *Special Note: Every effort is made to have your procedure done on time. Occasionally there are emergencies that occur at the hospital that may cause delays. Please be patient if a delay does occur.       Cardiac Ablation Cardiac ablation is a procedure to destroy (ablate) some heart tissue that is sending bad signals. These bad signals cause problems in heart rhythm. The heart has many areas that make these signals. If there are problems in these areas, they can make the heart beat in a way that is not normal. Destroying some tissues can help make the heart rhythm normal. Tell your doctor about: Any allergies you have. All medicines you are taking. These include vitamins, herbs, eye drops, creams, and over-the-counter medicines. Any problems you or family members have had  with medicines that make you fall asleep (anesthetics). Any blood disorders you have. Any surgeries you have had. Any medical conditions you have, such as kidney failure. Whether you are pregnant or may be pregnant. What are the risks? This is a safe procedure. But problems may occur, including: Infection. Bruising and bleeding. Bleeding into the chest. Stroke or blood clots. Damage to nearby areas of your body. Allergies to medicines or dyes. The need for a pacemaker if the normal system is damaged. Failure of the procedure to treat the problem. What happens before the procedure? Medicines Ask your doctor about: Changing or stopping your normal medicines. This is important. Taking aspirin and ibuprofen. Do not take these medicines unless your doctor tells you to take them. Taking other medicines, vitamins, herbs, and supplements. General instructions Follow instructions from your doctor about what you cannot eat or drink. Plan to have someone take you home from the hospital or clinic. If you will be going home right after the procedure, plan to have someone with you for 24 hours. Ask your doctor  what steps will be taken to prevent infection. What happens during the procedure?  An IV tube will be put into one of your veins. You will be given a medicine to help you relax. The skin on your neck or groin will be numbed. A cut (incision) will be made in your neck or groin. A needle will be put through your cut and into a large vein. A tube (catheter) will be put into the needle. The tube will be moved to your heart. Dye may be put through the tube. This helps your doctor see your heart. Small devices (electrodes) on the tube will send out signals. A type of energy will be used to destroy some heart tissue. The tube will be taken out. Pressure will be held on your cut. This helps stop bleeding. A bandage will be put over your cut. The exact procedure may vary among doctors and  hospitals. What happens after the procedure? You will be watched until you leave the hospital or clinic. This includes checking your heart rate, breathing rate, oxygen, and blood pressure. Your cut will be watched for bleeding. You will need to lie still for a few hours. Do not drive for 24 hours or as long as your doctor tells you. Summary Cardiac ablation is a procedure to destroy some heart tissue. This is done to treat heart rhythm problems. Tell your doctor about any medical conditions you may have. Tell him or her about all medicines you are taking to treat them. This is a safe procedure. But problems may occur. These include infection, bruising, bleeding, and damage to nearby areas of your body. Follow what your doctor tells you about food and drink. You may also be told to change or stop some of your medicines. After the procedure, do not drive for 24 hours or as long as your doctor tells you. This information is not intended to replace advice given to you by your health care provider. Make sure you discuss any questions you have with your health care provider. Document Revised: 09/05/2021 Document Reviewed: 05/18/2019 Elsevier Patient Education  2023 [employer].

## 2022-11-02 ENCOUNTER — Ambulatory Visit (INDEPENDENT_AMBULATORY_CARE_PROVIDER_SITE_OTHER): Payer: Medicare Other

## 2022-11-02 DIAGNOSIS — I480 Paroxysmal atrial fibrillation: Secondary | ICD-10-CM | POA: Diagnosis not present

## 2022-11-02 LAB — CUP PACEART REMOTE DEVICE CHECK
Battery Remaining Longevity: 99 mo
Battery Voltage: 3.02 V
Brady Statistic RA Percent Paced: 0.01 %
Brady Statistic RV Percent Paced: 90.77 %
Date Time Interrogation Session: 20240506044222
HighPow Impedance: 57 Ohm
Implantable Lead Connection Status: 753985
Implantable Lead Connection Status: 753985
Implantable Lead Connection Status: 753985
Implantable Lead Implant Date: 20231102
Implantable Lead Implant Date: 20231102
Implantable Lead Implant Date: 20231102
Implantable Lead Location: 753858
Implantable Lead Location: 753859
Implantable Lead Location: 753860
Implantable Lead Model: 4598
Implantable Lead Model: 5076
Implantable Lead Model: 6935
Implantable Pulse Generator Implant Date: 20231102
Lead Channel Impedance Value: 166.114
Lead Channel Impedance Value: 166.114
Lead Channel Impedance Value: 171 Ohm
Lead Channel Impedance Value: 171 Ohm
Lead Channel Impedance Value: 171 Ohm
Lead Channel Impedance Value: 266 Ohm
Lead Channel Impedance Value: 323 Ohm
Lead Channel Impedance Value: 342 Ohm
Lead Channel Impedance Value: 342 Ohm
Lead Channel Impedance Value: 342 Ohm
Lead Channel Impedance Value: 380 Ohm
Lead Channel Impedance Value: 456 Ohm
Lead Channel Impedance Value: 456 Ohm
Lead Channel Impedance Value: 570 Ohm
Lead Channel Impedance Value: 570 Ohm
Lead Channel Impedance Value: 589 Ohm
Lead Channel Impedance Value: 589 Ohm
Lead Channel Impedance Value: 627 Ohm
Lead Channel Pacing Threshold Amplitude: 0.5 V
Lead Channel Pacing Threshold Amplitude: 0.875 V
Lead Channel Pacing Threshold Amplitude: 1.125 V
Lead Channel Pacing Threshold Pulse Width: 0.4 ms
Lead Channel Pacing Threshold Pulse Width: 0.4 ms
Lead Channel Pacing Threshold Pulse Width: 0.4 ms
Lead Channel Sensing Intrinsic Amplitude: 2.25 mV
Lead Channel Sensing Intrinsic Amplitude: 2.25 mV
Lead Channel Sensing Intrinsic Amplitude: 6.25 mV
Lead Channel Sensing Intrinsic Amplitude: 6.25 mV
Lead Channel Setting Pacing Amplitude: 1.5 V
Lead Channel Setting Pacing Amplitude: 2 V
Lead Channel Setting Pacing Amplitude: 2 V
Lead Channel Setting Pacing Pulse Width: 0.4 ms
Lead Channel Setting Pacing Pulse Width: 0.4 ms
Lead Channel Setting Sensing Sensitivity: 0.3 mV
Zone Setting Status: 755011

## 2022-11-09 ENCOUNTER — Other Ambulatory Visit: Payer: Self-pay

## 2022-11-09 ENCOUNTER — Emergency Department (HOSPITAL_COMMUNITY): Payer: Medicare Other

## 2022-11-09 ENCOUNTER — Observation Stay (HOSPITAL_COMMUNITY)
Admission: EM | Admit: 2022-11-09 | Discharge: 2022-11-11 | Disposition: A | Discharge status: Home / Self Care | Payer: Medicare Other | Attending: Internal Medicine | Admitting: Internal Medicine

## 2022-11-09 ENCOUNTER — Encounter (HOSPITAL_COMMUNITY): Payer: Self-pay | Admitting: Family Medicine

## 2022-11-09 DIAGNOSIS — S065XAA Traumatic subdural hemorrhage with loss of consciousness status unknown, initial encounter: Secondary | ICD-10-CM | POA: Diagnosis not present

## 2022-11-09 DIAGNOSIS — R2681 Unsteadiness on feet: Secondary | ICD-10-CM | POA: Insufficient documentation

## 2022-11-09 DIAGNOSIS — I255 Ischemic cardiomyopathy: Secondary | ICD-10-CM

## 2022-11-09 DIAGNOSIS — D649 Anemia, unspecified: Secondary | ICD-10-CM | POA: Diagnosis not present

## 2022-11-09 DIAGNOSIS — I110 Hypertensive heart disease with heart failure: Secondary | ICD-10-CM | POA: Insufficient documentation

## 2022-11-09 DIAGNOSIS — W108XXA Fall (on) (from) other stairs and steps, initial encounter: Secondary | ICD-10-CM | POA: Insufficient documentation

## 2022-11-09 DIAGNOSIS — Z7901 Long term (current) use of anticoagulants: Secondary | ICD-10-CM | POA: Diagnosis not present

## 2022-11-09 DIAGNOSIS — Z951 Presence of aortocoronary bypass graft: Secondary | ICD-10-CM | POA: Diagnosis not present

## 2022-11-09 DIAGNOSIS — S065X0A Traumatic subdural hemorrhage without loss of consciousness, initial encounter: Principal | ICD-10-CM | POA: Insufficient documentation

## 2022-11-09 DIAGNOSIS — I5022 Chronic systolic (congestive) heart failure: Secondary | ICD-10-CM | POA: Diagnosis present

## 2022-11-09 DIAGNOSIS — W19XXXA Unspecified fall, initial encounter: Principal | ICD-10-CM

## 2022-11-09 DIAGNOSIS — Z87891 Personal history of nicotine dependence: Secondary | ICD-10-CM | POA: Diagnosis not present

## 2022-11-09 DIAGNOSIS — M25552 Pain in left hip: Secondary | ICD-10-CM | POA: Diagnosis not present

## 2022-11-09 DIAGNOSIS — E119 Type 2 diabetes mellitus without complications: Secondary | ICD-10-CM | POA: Diagnosis not present

## 2022-11-09 DIAGNOSIS — Z95810 Presence of automatic (implantable) cardiac defibrillator: Secondary | ICD-10-CM | POA: Diagnosis not present

## 2022-11-09 DIAGNOSIS — N179 Acute kidney failure, unspecified: Secondary | ICD-10-CM | POA: Diagnosis not present

## 2022-11-09 DIAGNOSIS — Z9181 History of falling: Secondary | ICD-10-CM | POA: Diagnosis not present

## 2022-11-09 DIAGNOSIS — Z89422 Acquired absence of other left toe(s): Secondary | ICD-10-CM | POA: Insufficient documentation

## 2022-11-09 DIAGNOSIS — I25810 Atherosclerosis of coronary artery bypass graft(s) without angina pectoris: Secondary | ICD-10-CM | POA: Diagnosis not present

## 2022-11-09 DIAGNOSIS — Z8673 Personal history of transient ischemic attack (TIA), and cerebral infarction without residual deficits: Secondary | ICD-10-CM | POA: Insufficient documentation

## 2022-11-09 DIAGNOSIS — S72002A Fracture of unspecified part of neck of left femur, initial encounter for closed fracture: Secondary | ICD-10-CM | POA: Diagnosis not present

## 2022-11-09 DIAGNOSIS — I2510 Atherosclerotic heart disease of native coronary artery without angina pectoris: Secondary | ICD-10-CM | POA: Diagnosis present

## 2022-11-09 DIAGNOSIS — S0990XA Unspecified injury of head, initial encounter: Secondary | ICD-10-CM | POA: Diagnosis present

## 2022-11-09 DIAGNOSIS — I6381 Other cerebral infarction due to occlusion or stenosis of small artery: Secondary | ICD-10-CM | POA: Diagnosis not present

## 2022-11-09 DIAGNOSIS — I4819 Other persistent atrial fibrillation: Secondary | ICD-10-CM | POA: Diagnosis not present

## 2022-11-09 DIAGNOSIS — I10 Essential (primary) hypertension: Secondary | ICD-10-CM | POA: Diagnosis not present

## 2022-11-09 DIAGNOSIS — M6281 Muscle weakness (generalized): Secondary | ICD-10-CM | POA: Diagnosis not present

## 2022-11-09 DIAGNOSIS — I4891 Unspecified atrial fibrillation: Secondary | ICD-10-CM | POA: Diagnosis present

## 2022-11-09 DIAGNOSIS — I4811 Longstanding persistent atrial fibrillation: Secondary | ICD-10-CM | POA: Diagnosis not present

## 2022-11-09 DIAGNOSIS — Z950 Presence of cardiac pacemaker: Secondary | ICD-10-CM | POA: Insufficient documentation

## 2022-11-09 DIAGNOSIS — S7002XA Contusion of left hip, initial encounter: Secondary | ICD-10-CM | POA: Insufficient documentation

## 2022-11-09 DIAGNOSIS — S199XXA Unspecified injury of neck, initial encounter: Secondary | ICD-10-CM | POA: Diagnosis not present

## 2022-11-09 LAB — CBC WITH DIFFERENTIAL/PLATELET
Abs Immature Granulocytes: 0.06 10*3/uL (ref 0.00–0.07)
Basophils Absolute: 0.1 10*3/uL (ref 0.0–0.1)
Basophils Relative: 1 %
Eosinophils Absolute: 0.4 10*3/uL (ref 0.0–0.5)
Eosinophils Relative: 6 %
HCT: 35.1 % — ABNORMAL LOW (ref 39.0–52.0)
Hemoglobin: 11.6 g/dL — ABNORMAL LOW (ref 13.0–17.0)
Immature Granulocytes: 1 %
Lymphocytes Relative: 14 %
Lymphs Abs: 1 10*3/uL (ref 0.7–4.0)
MCH: 27.7 pg (ref 26.0–34.0)
MCHC: 33 g/dL (ref 30.0–36.0)
MCV: 83.8 fL (ref 80.0–100.0)
Monocytes Absolute: 0.6 10*3/uL (ref 0.1–1.0)
Monocytes Relative: 8 %
Neutro Abs: 5.3 10*3/uL (ref 1.7–7.7)
Neutrophils Relative %: 70 %
Platelets: 232 10*3/uL (ref 150–400)
RBC: 4.19 MIL/uL — ABNORMAL LOW (ref 4.22–5.81)
RDW: 15.4 % (ref 11.5–15.5)
WBC: 7.5 10*3/uL (ref 4.0–10.5)
nRBC: 0 % (ref 0.0–0.2)

## 2022-11-09 LAB — GLUCOSE, CAPILLARY: Glucose-Capillary: 200 mg/dL — ABNORMAL HIGH (ref 70–99)

## 2022-11-09 LAB — BASIC METABOLIC PANEL
Anion gap: 9 (ref 5–15)
BUN: 26 mg/dL — ABNORMAL HIGH (ref 8–23)
CO2: 28 mmol/L (ref 22–32)
Calcium: 8.5 mg/dL — ABNORMAL LOW (ref 8.9–10.3)
Chloride: 99 mmol/L (ref 98–111)
Creatinine, Ser: 1.39 mg/dL — ABNORMAL HIGH (ref 0.61–1.24)
GFR, Estimated: 52 mL/min — ABNORMAL LOW (ref >60)
Glucose, Bld: 283 mg/dL — ABNORMAL HIGH (ref 70–99)
Potassium: 3.4 mmol/L — ABNORMAL LOW (ref 3.5–5.1)
Sodium: 136 mmol/L (ref 135–145)

## 2022-11-09 LAB — CBG MONITORING, ED
Glucose-Capillary: 191 mg/dL — ABNORMAL HIGH (ref 70–99)
Glucose-Capillary: 185 mg/dL — ABNORMAL HIGH (ref 70–99)

## 2022-11-09 LAB — PROTIME-INR
INR: 1.8 — ABNORMAL HIGH (ref 0.8–1.2)
Prothrombin Time: 21.1 seconds — ABNORMAL HIGH (ref 11.4–15.2)

## 2022-11-09 MED ORDER — INSULIN ASPART 100 UNIT/ML IJ SOLN
0.0000 [IU] | Freq: Every day | INTRAMUSCULAR | Status: DC
  Administered 2022-11-10: 2 [IU] via SUBCUTANEOUS

## 2022-11-09 MED ORDER — POTASSIUM CHLORIDE CRYS ER 20 MEQ PO TBCR
40.0000 meq | EXTENDED_RELEASE_TABLET | Freq: Once | ORAL | Status: AC
  Administered 2022-11-09: 40 meq via ORAL
  Filled 2022-11-09: qty 2

## 2022-11-09 MED ORDER — GABAPENTIN 300 MG PO CAPS
600.0000 mg | ORAL_CAPSULE | Freq: Every day | ORAL | Status: DC
  Administered 2022-11-09 – 2022-11-10 (×2): 600 mg via ORAL
  Filled 2022-11-09 (×2): qty 2

## 2022-11-09 MED ORDER — EMPTY CONTAINERS FLEXIBLE MISC
900.0000 mg | Freq: Once | Status: AC
  Administered 2022-11-09: 900 mg via INTRAVENOUS
  Filled 2022-11-09: qty 90

## 2022-11-09 MED ORDER — ROSUVASTATIN CALCIUM 5 MG PO TABS
10.0000 mg | ORAL_TABLET | ORAL | Status: DC
  Administered 2022-11-10: 10 mg via ORAL
  Filled 2022-11-09: qty 2

## 2022-11-09 MED ORDER — ACETAMINOPHEN 500 MG PO TABS
500.0000 mg | ORAL_TABLET | Freq: Four times a day (QID) | ORAL | Status: DC | PRN
  Administered 2022-11-09: 1000 mg via ORAL
  Filled 2022-11-09: qty 2

## 2022-11-09 MED ORDER — AMIODARONE HCL 200 MG PO TABS
200.0000 mg | ORAL_TABLET | Freq: Every day | ORAL | Status: DC
  Administered 2022-11-10 – 2022-11-11 (×2): 200 mg via ORAL
  Filled 2022-11-09 (×2): qty 1

## 2022-11-09 MED ORDER — CARVEDILOL 3.125 MG PO TABS
3.1250 mg | ORAL_TABLET | Freq: Two times a day (BID) | ORAL | Status: DC
  Administered 2022-11-10 – 2022-11-11 (×2): 3.125 mg via ORAL
  Filled 2022-11-09 (×3): qty 1

## 2022-11-09 MED ORDER — FUROSEMIDE 40 MG PO TABS
40.0000 mg | ORAL_TABLET | Freq: Two times a day (BID) | ORAL | Status: DC
  Administered 2022-11-09 – 2022-11-11 (×4): 40 mg via ORAL
  Filled 2022-11-09 (×4): qty 1

## 2022-11-09 MED ORDER — INSULIN ASPART 100 UNIT/ML IJ SOLN
0.0000 [IU] | Freq: Three times a day (TID) | INTRAMUSCULAR | Status: DC
  Administered 2022-11-09: 2 [IU] via SUBCUTANEOUS
  Administered 2022-11-10: 1 [IU] via SUBCUTANEOUS
  Administered 2022-11-10: 5 [IU] via SUBCUTANEOUS
  Administered 2022-11-10: 3 [IU] via SUBCUTANEOUS
  Administered 2022-11-11: 2 [IU] via SUBCUTANEOUS
  Filled 2022-11-09: qty 1

## 2022-11-09 MED ORDER — SODIUM CHLORIDE 0.9% FLUSH
3.0000 mL | Freq: Two times a day (BID) | INTRAVENOUS | Status: DC
  Administered 2022-11-09 – 2022-11-11 (×4): 3 mL via INTRAVENOUS

## 2022-11-09 NOTE — ED Provider Notes (Signed)
[place] EMERGENCY DEPARTMENT AT [facility] Provider Note   CSN: [id number] Arrival date & time: 11/09/22  1019     History  Chief Complaint  Patient presents with   [name] [name] is a 77 y.o. male.  Pt is a 77 yo male with pmhx significant for afib (on Eliquis), DM, CAD, HLD, ischemic cardiomyopathy, CVA, arthritis, CHF, and sleep apnea.  Pt was walking up his stairs around 2030 last night and his knee gave out.  He fell backwards and hit the back of his head.  He also hit his left hip, but has been able to walk.  He denies loc.         Home Medications Prior to Admission medications   Medication Sig Start Date End Date Taking? Authorizing Provider  acetaminophen (TYLENOL) 500 MG tablet Take 500-1,000 mg by mouth every 6 (six) hours as needed (for pain.).   Yes [provider]  amiodarone (PACERONE) 200 MG tablet Take 2 tablets (400 mg total) TWICE a day for 2 weeks, then take 1 tablet (200 mg total) TWICE a day for 2 weeks, then take 1 tablet (200 mg total) ONCE a day thereafter Patient taking differently: Take 200 mg by mouth daily. 10/27/22  Yes Camnitz, Andree Coss, MD  aspirin EC 81 MG tablet Take 81 mg by mouth at bedtime. Swallow whole.   Yes [provider]  carvedilol (COREG) 3.125 MG tablet Take 1 tablet (3.125 mg total) by mouth 2 (two) times daily with a meal. 08/20/22  Yes [name], Lisette Abu, MD  ELIQUIS 5 MG TABS tablet Take 1 tablet by mouth twice daily Patient taking differently: Take 5 mg by mouth 2 (two) times daily. 03/20/20  Yes [name], Lisette Abu, MD  empagliflozin (JARDIANCE) 25 MG TABS tablet Take 1 tablet (25 mg total) by mouth daily. 08/20/22  Yes [name], Lisette Abu, MD  furosemide (LASIX) 40 MG tablet Take 1 1/2 tablet by mouth in the morning and 1 tablet by mouth in the afternoon for 4 days then go back down to 1 tablet by mouth twice a day Patient taking differently: Take 40 mg by mouth 2 (two) times daily. Take 1 1/2  tablet by mouth in the morning and 1 tablet by mouth in the afternoon for 4 days then go back down to 1 tablet by mouth twice a day 10/22/22  Yes Duke, Roe Rutherford, PA  gabapentin (NEURONTIN) 300 MG capsule Take 600 mg by mouth at bedtime.   Yes [provider]  glipiZIDE (GLUCOTROL XL) 10 MG 24 hr tablet Take 10 mg by mouth at bedtime.   Yes [provider]  metFORMIN (GLUCOPHAGE-XR) 500 MG 24 hr tablet Take 1,000 mg by mouth 2 (two) times daily. 07/05/15  Yes [provider]  rosuvastatin (CRESTOR) 10 MG tablet Take 1 tablet (10 mg total) by mouth every other day. 10/22/22  Yes Duke, Roe Rutherford, PA  [facility] VERIO test strip 1 each daily. 08/16/19   [provider]      Allergies    Entresto [sacubitril-valsartan] and Sacubitril    Review of Systems   Review of Systems  Musculoskeletal:        Left hip pain  Neurological:  Positive for headaches.  All other systems reviewed and are negative.   Physical Exam Updated Vital Signs BP 134/84   Pulse (!) 58   Temp 97.8 F (36.6 C) (Oral)   Resp 18   Ht 6'  2" (1.88 m)   Wt 97.5 kg   SpO2 97%   BMI 27.60 kg/m  Physical Exam Vitals and nursing note reviewed.  Constitutional:      Appearance: Normal appearance.  HENT:     Head: Normocephalic.     Comments: Hematoma posterior head    Right Ear: External ear normal.     Left Ear: External ear normal.     Nose: Nose normal.     Mouth/Throat:     Mouth: Mucous membranes are moist.     Pharynx: Oropharynx is clear.  Eyes:     Extraocular Movements: Extraocular movements intact.     Pupils: Pupils are equal, round, and reactive to light.  Cardiovascular:     Rate and Rhythm: Normal rate and regular rhythm.     Pulses: Normal pulses.     Heart sounds: Normal heart sounds.  Pulmonary:     Effort: Pulmonary effort is normal.     Breath sounds: Normal breath sounds.  Abdominal:     General: Abdomen is flat. Bowel sounds are normal.      Palpations: Abdomen is soft.  Musculoskeletal:     Cervical back: Normal range of motion and neck supple.     Comments: Left hip with mild tenderness.  He has good rom.  Skin:    General: Skin is warm.     Capillary Refill: Capillary refill takes less than 2 seconds.  Neurological:     General: No focal deficit present.     Mental Status: He is alert and oriented to person, place, and time.  Psychiatric:        Mood and Affect: Mood normal.        Behavior: Behavior normal.     ED Results / Procedures / Treatments   Labs (all labs ordered are listed, but only abnormal results are displayed) Labs Reviewed  BASIC METABOLIC PANEL - Abnormal; Notable for the following components:      Result Value   Potassium 3.4 (*)    Glucose, Bld 283 (*)    BUN 26 (*)    Creatinine, Ser 1.39 (*)    Calcium 8.5 (*)    GFR, Estimated 52 (*)    All other components within normal limits  CBC WITH DIFFERENTIAL/PLATELET - Abnormal; Notable for the following components:   RBC 4.19 (*)    Hemoglobin 11.6 (*)    HCT 35.1 (*)    All other components within normal limits  PROTIME-INR - Abnormal; Notable for the following components:   Prothrombin Time 21.1 (*)    INR 1.8 (*)    All other components within normal limits    EKG EKG Interpretation  Date/Time:  Monday Nov 09 2022 11:33:58 EDT Ventricular Rate:  54 PR Interval:    QRS Duration: 165 QT Interval:  576 QTC Calculation: 546 R Axis:   260 Text Interpretation: Atrial fibrillation Paired ventricular premature complexes Right bundle branch block Inferior infarct, old Abnormal lateral Q waves Anterior infarct, old Baseline wander in lead(s) III No significant change since last tracing Confirmed by Jacalyn Lefevre [phone]) on 11/09/2022 1:16:20 PM  Radiology CT Head Wo Contrast  Result Date: 11/09/2022 CLINICAL DATA:  Blunt poly trauma. EXAM: CT HEAD WITHOUT CONTRAST TECHNIQUE: Contiguous axial images were obtained from the base of the  skull through the vertex without intravenous contrast. RADIATION DOSE REDUCTION: This exam was performed according to the departmental dose-optimization program which includes automated exposure control, adjustment of the mA and/or kV  according to patient size and/or use of iterative reconstruction technique. COMPARISON:  12/19/2017 FINDINGS: Brain: No evidence of acute infarction, hydrocephalus, or mass. Acute subdural hemorrhage along the anterior falx measuring 4 mm in thickness. Left cerebellar encephalomalacia. Generalized cerebral atrophy. Left subinsular lacunar infarct. Vascular: No hyperdense vessel. Intracranial atherosclerotic disease. Skull: No osseous abnormality. Sinuses/Orbits: Visualized paranasal sinuses are clear. Visualized mastoid sinuses are clear. Visualized orbits demonstrate no focal abnormality. Other: None IMPRESSION: 1. Acute subdural hemorrhage along the anterior falx measuring 4 mm in thickness. No mass effect. Critical Value/emergent results were called by telephone at the time of interpretation on 11/09/2022 at 12:52 pm to provider Aquita Simmering , who verbally acknowledged these results. Electronically Signed   By: Elige Ko M.D.   On: 11/09/2022 12:53   CT Cervical Spine Wo Contrast  Result Date: 11/09/2022 CLINICAL DATA:  Neck trauma (Age >= 65y) EXAM: CT CERVICAL SPINE WITHOUT CONTRAST TECHNIQUE: Multidetector CT imaging of the cervical spine was performed without intravenous contrast. Multiplanar CT image reconstructions were also generated. RADIATION DOSE REDUCTION: This exam was performed according to the departmental dose-optimization program which includes automated exposure control, adjustment of the mA and/or kV according to patient size and/or use of iterative reconstruction technique. COMPARISON:  None Available. FINDINGS: Alignment: Facet joints are aligned without dislocation or traumatic listhesis. Dens and lateral masses are aligned. Skull base and vertebrae: No  acute fracture. No primary bone lesion or focal pathologic process. The right C3-4 facet joint is fused. Soft tissues and spinal canal: No prevertebral fluid or swelling. No visible canal hematoma. 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 Disc levels: Relatively mild disc height loss. There is multilevel facet and uncovertebral arthropathy. Upper chest: Negative. Other: Bilateral carotid atherosclerosis. IMPRESSION: 1. No acute fracture or traumatic listhesis of the cervical spine. 2. Mild-moderate multilevel degenerative changes of the cervical spine. Electronically Signed   By: Duanne Guess D.O.   On: 11/09/2022 12:49   DG HIP UNILAT WITH PELVIS 2-3 VIEWS LEFT  Result Date: 11/09/2022 CLINICAL DATA:  Fall with left hip pain. EXAM: DG HIP (WITH OR WITHOUT PELVIS) 2-3V LEFT COMPARISON:  None Available. 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 No acute fracture or dislocation. Degenerative changes of the spine and sacroiliac joints. IMPRESSION: 1. No acute findings. 2. 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 Electronically Signed   By: [name] M.D.   On: 11/09/2022 12:47    Procedures Procedures    Medications Ordered in ED Medications  coag fact Xa recombinant (ANDEXXA) low dose infusion 900 mg (900 mg Intravenous New Bag/Given 11/09/22 1412)    ED Course/ Medical Decision Making/ A&P                             Medical Decision Making Amount and/or Complexity of Data Reviewed Labs: ordered. Radiology: ordered.  Risk Decision regarding hospitalization.   This patient presents to the ED for concern of fall, this involves an extensive number of treatment options, and is a complaint that carries with it a high risk of complications and morbidity.  The differential diagnosis includes multiple trauma   Co morbidities that complicate the patient evaluation  afib (on Eliquis), DM, CAD, HLD, ischemic  cardiomyopathy, CVA, arthritis, CHF, and sleep apnea   Additional history obtained:  Additional history obtained from epic chart review External records from outside source obtained and reviewed including daughter   Lab  Tests:  I Ordered, and personally interpreted labs.  The pertinent results include:  cbc with hgb 11.6 (hgb 12.1 on 4/15)   Imaging Studies ordered:  I ordered imaging studies including ct head/c-spine, knee  I independently visualized and interpreted imaging which showed  CT head:  Acute subdural hemorrhage along the anterior falx measuring 4 mm  in thickness. No mass effect.  CT cervical spine:  No acute fracture or traumatic listhesis of the cervical spine.  2. Mild-moderate multilevel degenerative changes of the cervical  spine.  Left hip: No acute findings.  2. 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   I agree with the radiologist interpretation   Cardiac Monitoring:  The patient was maintained on a cardiac monitor.  I personally viewed and interpreted the cardiac monitored which showed an underlying rhythm of: afib   Medicines ordered and prescription drug management:  I ordered medication including andexxa  for eliquis reversal  Reevaluation of the patient after these medicines showed that the patient stayed the same I have reviewed the patients home medicines and have made adjustments as needed   Test Considered:  ct   Critical Interventions:  andexxa   Consultations Obtained:  I requested consultation with the NS (Dr. Wynetta Emery),  and discussed lab and imaging findings as well as pertinent plan -he recommends reversing eliquis and admission for observation.  Repeat ct in the am.  No surgery planned unless sx worsen. Pt d/w Dr. Jenene Slicker (cards) because daughter is very concerned about reversing eliquis and holding the eliquis Pt d/w Dr. Jarvis Newcomer (triad) who will admit.   Problem List / ED  Course:  SDH   Reevaluation:  After the interventions noted above, I reevaluated the patient and found that they have :improved   Social Determinants of Health:  Lives at home   Dispostion:  After consideration of the diagnostic results and the patients response to treatment, I feel that the patent would benefit from admission.    CRITICAL CARE Performed by: Jacalyn Lefevre   Total critical care time: 30 minutes  Critical care time was exclusive of separately billable procedures and treating other patients.  Critical care was necessary to treat or prevent imminent or life-threatening deterioration.  Critical care was time spent personally by me on the following activities: development of treatment plan with patient and/or surrogate as well as nursing, discussions with consultants, evaluation of patient's response to treatment, examination of patient, obtaining history from patient or surrogate, ordering and performing treatments and interventions, ordering and review of laboratory studies, ordering and review of radiographic studies, pulse oximetry and re-evaluation of patient's condition.         Final Clinical Impression(s) / ED Diagnoses Final diagnoses:  Fall, initial encounter  SDH (subdural hematoma) (HCC)  Contusion of left hip, initial encounter  On apixaban therapy    Rx / DC Orders ED Discharge Orders     None         Jacalyn Lefevre, MD 11/09/22 1504

## 2022-11-09 NOTE — Consult Note (Addendum)
CARDIOLOGY CONSULT NOTE    Patient ID: [name] [name]; [id number]; Nov 26, 1945   Admit date: 11/09/2022 Date of Consult: 11/09/2022  Primary Care Provider: [name], MD Primary Cardiologist:  Primary Electrophysiologist:     Patient Profile:   [name] [name] is a 77 y.o. male who is being seen today for the evaluation of subdural hematoma and A-fib history at the request of Dr. Particia Nearing.  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 Last dose of Eliquis was this a.m. Patient fell last night (did not have any dizziness or palpitations, he tripped and fell down) but did not inform his daughter until this a.m. as he thought this was not important.  Eliquis was held and reversal antibiotic was administered. Neurosurgery was consulted who recommended to repeat CT scan in 24 hours and rule out any mass effect.  Patient underwent LHC in 11/2021 that showed patent LIMA to LAD but occluded SVG to PDA and SVG to PLA. Heart team reviewed, considered medical therapy as complex atherectomy and intervention of the left circumflex will involve stenting back into the left main. Cardiac MRI was performed that showed thinning/akinesis of the lateral and inferior walls.  Lateral wall and apical inferior wall measured less than 4.5 mm in thickness suggesting nonviability. LVEF 28%, moderate MR and moderate TR was seen.  Past Medical History:  Diagnosis Date   Arthritis    Knee L - probably   Atrial fibrillation (HCC)    BPH  (benign prostatic hyperplasia)    CAD (coronary artery disease) 07/06/2011   CHF (congestive heart failure) (HCC)    Coronary artery disease    Diabetes mellitus    Frequency of urination    Heart failure (HCC)    High cholesterol    History of nuclear stress test 09/2010   dipyridamole; mild perfusion defect due to attenuation with mild superimposed ischemia in apical septal, apical, apical inferior & apical lateral regions; rest LV enlarged in size; prominent gut uptake in infero-apical region; no significant ischemia demonstrated; low risk scan    HNP (herniated nucleus pulposus), lumbar    Hypertension    Ischemic cardiomyopathy    LV dysfunction 07/06/2011   Nocturia    Right bundle branch block    S/P CABG (coronary artery bypass graft) 08/03/2011   x3; LIMA to LAD,, SVG to PDA, SVG to posterolateral branch of RCA; Dr. Wayland Salinas   Sleep apnea    sleep study 10/2010- AHI during total sleep 32.1/hr and during REM 62.3/hr (severe sleep apnea)unable to tolerate c pap   Spinal stenosis of lumbar region    Stroke [facility])    [place]     Past Surgical History:  Procedure Laterality Date   ABDOMINAL AORTOGRAM W/LOWER EXTREMITY Left 11/14/2020   Procedure: ABDOMINAL AORTOGRAM W/LOWER EXTREMITY;  Surgeon: Cephus Shelling, MD;  Location: [facility] INVASIVE CV LAB;  Service: Cardiovascular;  Laterality: Left;   ABDOMINAL AORTOGRAM W/LOWER EXTREMITY N/A 01/02/2021   Procedure: ABDOMINAL  AORTOGRAM W/LOWER EXTREMITY;  Surgeon: Cephus Shelling, MD;  Location: [facility] INVASIVE CV LAB;  Service: Cardiovascular;  Laterality: N/A;   AMPUTATION Left 03/20/2021   Procedure: Left foot 5th ray amputation;  Surgeon: Toni Arthurs, MD;  Location: [facility] OR;  Service: Orthopedics;  Laterality: Left;   BIV ICD INSERTION CRT-D N/A 04/30/2022   Procedure: BIV ICD INSERTION CRT-D;  Surgeon: [name], MD;  Location: [facility] INVASIVE CV LAB;  Service: Cardiovascular;  Laterality: N/A;   CARDIAC CATHETERIZATION   01/2011   ischemic cardiomyopathy 30-35%, multivessel CAD (Dr. Bishop Limbo)    CARDIAC CATHETERIZATION  09/13/2015   Procedure: Right/Left Heart Cath and Coronary/Graft Angiography;  Surgeon: [name], MD;  Location: [facility] INVASIVE CV LAB;  Service: Cardiovascular;;   CARDIOVERSION N/A 05/16/2019   Procedure: CARDIOVERSION;  Surgeon: [name], MD;  Location: [facility] ENDOSCOPY;  Service: Cardiovascular;  Laterality: N/A;   CARDIOVERSION N/A 10/16/2022   Procedure: CARDIOVERSION;  Surgeon: Thurmon Fair, MD;  Location: [facility] INVASIVE CV LAB;  Service: Cardiovascular;  Laterality: N/A;   Colonscopy     CORONARY ARTERY BYPASS GRAFT  08/03/2011   Procedure: CORONARY ARTERY BYPASS GRAFTING (CABG);  Surgeon: Alleen Borne, MD;  Location: [facility] OR;  Service: Open Heart Surgery;  Laterality: N/A;  CABG times three using right saphenous vein and left mammary artery usisng endoscope.   LEFT HEART CATH AND CORS/GRAFTS ANGIOGRAPHY N/A 12/15/2021   Procedure: LEFT HEART CATH AND CORS/GRAFTS ANGIOGRAPHY;  Surgeon: Tonny Bollman, MD;  Location: [facility] INVASIVE CV LAB;  Service: Cardiovascular;  Laterality: N/A;   LEFT HEART CATHETERIZATION WITH CORONARY/GRAFT ANGIOGRAM N/A 09/20/2013   Procedure: LEFT HEART CATHETERIZATION WITH Isabel Caprice;  Surgeon: [name], MD;  Location: [facility] CATH LAB;  Service: Cardiovascular;  Laterality: N/A;   Lower Extremity Arterial Doppler  03/16/2013   bilat ABIs demonstated normal values; R runoff - posterior tibial & anterior tibial arteries occluded; L runoff - peroneal & posterior tibial arteries occluded, anterior tibial artery appears occluded   LUMBAR LAMINECTOMY/DECOMPRESSION MICRODISCECTOMY N/A 09/12/2014   Procedure: LUMBAR DECOMPRESSION L3-L4, L4-L5, MICRODISCECTOMY L4-L5;  Surgeon: Jene Every, MD;  Location: [facility] ORS;  Service: Orthopedics;  Laterality: N/A;   PERIPHERAL VASCULAR BALLOON ANGIOPLASTY  11/14/2020   Procedure: PERIPHERAL VASCULAR BALLOON  ANGIOPLASTY;  Surgeon: Cephus Shelling, MD;  Location: [facility] INVASIVE CV LAB;  Service: Cardiovascular;;   PERIPHERAL VASCULAR BALLOON ANGIOPLASTY Left 01/02/2021   Procedure: PERIPHERAL VASCULAR BALLOON ANGIOPLASTY;  Surgeon: Cephus Shelling, MD;  Location: [facility] INVASIVE CV LAB;  Service: Cardiovascular;  Laterality: Left;  Failed PTA   Pilonidal Cyst removed     TRANSTHORACIC ECHOCARDIOGRAM  11/10/2012   EF 40-45%, mild LVH, mild hypokinesis of anteroseptal myocardium, grade 1 diastolic dysfunction; mild MR & calcifed mitral annulus; LA mild-mod dilated; RA mildly dilated       Inpatient Medications: Scheduled Meds:  [START ON 11/10/2022] amiodarone  200 mg Oral Daily   carvedilol  3.125 mg Oral BID WC   furosemide  40 mg Oral BID   gabapentin  600 mg Oral QHS   insulin aspart  0-5 Units Subcutaneous QHS   insulin aspart  0-9 Units Subcutaneous TID WC   potassium chloride  40 mEq Oral Once   [START ON 11/10/2022] rosuvastatin  10 mg Oral QODAY   sodium chloride flush  3 mL Intravenous Q12H   Continuous Infusions:  PRN Meds: acetaminophen  Allergies:    Allergies  Allergen Reactions   Entresto [Sacubitril-Valsartan] Other (See  Comments)    dizziness   Sacubitril Other (See Comments)     Dizziness    Social History:   Social History   Socioeconomic History   Marital status: Widowed    Spouse name: Not on file   Number of children: 1   Years of education: Not on file   Highest education level: Not on file  Occupational History   Occupation: real Psychologist, occupational  Tobacco Use   Smoking status: Former    Types: Cigars    Quit date: 09/07/2011    Years since quitting: 11.1   Smokeless tobacco: Former    Quit date: 02/28/2012  Substance and Sexual Activity   Alcohol use: Yes    Alcohol/week: 1.0 - 2.0 standard drink of alcohol    Types: 1 - 2 Cans of beer per week   Drug use: No   Sexual activity: Not on file  Other Topics Concern   Not on file  Social History  Narrative   ** Merged History Encounter **       Social Determinants of Health   Financial Resource Strain: Not on file  Food Insecurity: Not on file  Transportation Needs: Not on file  Physical Activity: Not on file  Stress: Not on file  Social Connections: Not on file  Intimate Partner Violence: Not on file    Family History:    Family History  Problem Relation Age of Onset   Cancer Mother    Lung disease Father        & heart disease   Colon polyps Father    Colon cancer Sister    Sudden death Maternal Grandmother    Anesthesia problems Daughter      ROS:  Please see the history of present illness.  ROS  All other ROS reviewed and negative.     Physical Exam/Data:   Vitals:   11/09/22 1400 11/09/22 1430 11/09/22 1445 11/09/22 1505  BP: 134/84 138/67 136/66   Pulse: (!) 58 (!) 57 (!) 58   Resp: 18     Temp:    98 F (36.7 C)  TempSrc:    Oral  SpO2: 97% 98% 99%   Weight:      Height:       No intake or output data in the 24 hours ending 11/09/22 1640 Filed Weights   11/09/22 1109  Weight: 97.5 kg   Body mass index is 27.6 kg/m.  General:  Well nourished, well developed, in no acute distress HEENT: normal Lymph: no adenopathy Neck: no JVD Endocrine:  No thryomegaly Vascular: No carotid bruits; FA pulses 2+ bilaterally without bruits  Cardiac:  normal S1, S2; RRR; no murmur  Lungs:  clear to auscultation bilaterally, no wheezing, rhonchi or rales  Abd: soft, nontender, no hepatomegaly  Ext: no edema Musculoskeletal:  No deformities, BUE and BLE strength normal and equal Skin: warm and dry  Neuro:  CNs 2-12 intact, no focal abnormalities noted Psych:  Normal affect   EKG:  The EKG was personally reviewed and demonstrates: A-fib with paced rhythm and PVCs Telemetry:  Telemetry was personally reviewed and demonstrates: A-fib with paced rhythm and PVCs  Relevant CV Studies:   Laboratory Data:  Chemistry Recent Labs  Lab 11/09/22 1159  NA 136   K 3.4*  CL 99  CO2 28  GLUCOSE 283*  BUN 26*  CREATININE 1.39*  CALCIUM 8.5*  GFRNONAA 52*  ANIONGAP 9    No results for input(s): "PROT", "ALBUMIN", "AST", "ALT", "  ALKPHOS", "BILITOT" in the last 168 hours. Hematology Recent Labs  Lab 11/09/22 1159  WBC 7.5  RBC 4.19*  HGB 11.6*  HCT 35.1*  MCV 83.8  MCH 27.7  MCHC 33.0  RDW 15.4  PLT 232   Cardiac EnzymesNo results for input(s): "TROPONINI" in the last 168 hours. No results for input(s): "TROPIPOC" in the last 168 hours.  BNPNo results for input(s): "BNP", "PROBNP" in the last 168 hours.  DDimer No results for input(s): "DDIMER" in the last 168 hours.  Radiology/Studies:  CT Head Wo Contrast  Result Date: 11/09/2022 CLINICAL DATA:  Blunt poly trauma. EXAM: CT HEAD WITHOUT CONTRAST TECHNIQUE: Contiguous axial images were obtained from the base of the skull through the vertex without intravenous contrast. RADIATION DOSE REDUCTION: This exam was performed according to the departmental dose-optimization program which includes automated exposure control, adjustment of the mA and/or kV according to patient size and/or use of iterative reconstruction technique. COMPARISON:  12/19/2017 FINDINGS: Brain: No evidence of acute infarction, hydrocephalus, or mass. Acute subdural hemorrhage along the anterior falx measuring 4 mm in thickness. Left cerebellar encephalomalacia. Generalized cerebral atrophy. Left subinsular lacunar infarct. Vascular: No hyperdense vessel. Intracranial atherosclerotic disease. Skull: No osseous abnormality. Sinuses/Orbits: Visualized paranasal sinuses are clear. Visualized mastoid sinuses are clear. Visualized orbits demonstrate no focal abnormality. Other: None IMPRESSION: 1. Acute subdural hemorrhage along the anterior falx measuring 4 mm in thickness. No mass effect. Critical Value/emergent results were called by telephone at the time of interpretation on 11/09/2022 at 12:52 pm to provider JULIE HAVILAND , who  verbally acknowledged these results. Electronically Signed   By: Elige Ko M.D.   On: 11/09/2022 12:53   CT Cervical Spine Wo Contrast  Result Date: 11/09/2022 CLINICAL DATA:  Neck trauma (Age >= 65y) EXAM: CT CERVICAL SPINE WITHOUT CONTRAST TECHNIQUE: Multidetector CT imaging of the cervical spine was performed without intravenous contrast. Multiplanar CT image reconstructions were also generated. RADIATION DOSE REDUCTION: This exam was performed according to the departmental dose-optimization program which includes automated exposure control, adjustment of the mA and/or kV according to patient size and/or use of iterative reconstruction technique. COMPARISON:  None Available. FINDINGS: Alignment: Facet joints are aligned without dislocation or traumatic listhesis. Dens and lateral masses are aligned. Skull base and vertebrae: No acute fracture. No primary bone lesion or focal pathologic process. The right C3-4 facet joint is fused. Soft tissues and spinal canal: No prevertebral fluid or swelling. No visible canal hematoma. 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 Disc levels: Relatively mild disc height loss. There is multilevel facet and uncovertebral arthropathy. Upper chest: Negative. Other: Bilateral carotid atherosclerosis. IMPRESSION: 1. No acute fracture or traumatic listhesis of the cervical spine. 2. Mild-moderate multilevel degenerative changes of the cervical spine. Electronically Signed   By: Duanne Guess D.O.   On: 11/09/2022 12:49   DG HIP UNILAT WITH PELVIS 2-3 VIEWS LEFT  Result Date: 11/09/2022 CLINICAL DATA:  Fall with left hip pain. EXAM: DG HIP (WITH OR WITHOUT PELVIS) 2-3V LEFT COMPARISON:  None Available. 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 No acute fracture or dislocation. Degenerative changes of the spine and sacroiliac joints. IMPRESSION: 1. No acute findings. 2. 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 Electronically Signed   By: [name] M.D.   On: 11/09/2022 12:47    Assessment and Plan:   Patient is a 77 year old M known to have CAD s/p CABG in 2013 with LVEF 30 to 35%  s/p CRT-D 04/2022, HFimpEF (20 to 25% in 2013 that improved to 40 to 45% in 2014 which again dropped to 30 to 35% in 11/2021), persistent A-fib s/p unsuccessful DCCV on 04/2019, 10/16/2022 (initially successfully converted to NSR but he was interrogation showed early return to A-fib), HTN, HLD, OSA on BiPAP, severe MR presented to ER with mechanical fall resulting in subdural hematoma 4 mm without any mass effect.   # Persistent A-fib s/p DCCV in 04/2019 and DCCV in 09/2022 # Subdural hematoma 4 mm without mass effect -Continue amiodarone 200 mg once daily and carvedilol 3.125 mg twice daily. Telemetry showed atrial fibrillation with paced rhythm and PVCs. -Patient scheduled for DCCV on 11/19/2022. Will cancel DCCV due to new onset subdural hematoma and have him seen in the A-fib clinic in 1 week upon discharge. Discussed with EP, Dr. [name]. -Hold Eliquis for now  -He also is scheduled for A-fib ablation in July 2024 when his ACT has to be maintained between 300 and 350 (will be on IV heparin throughout the procedure), need to discuss with neurosurgery if it is okay to proceed with the ablation due to new onset subdural hematoma. -OSA management with BiPAP  # CAD s/p CABG in 2013 (patent LIMA to LAD graft, occluded SVG grafts to PDA and PLA) on medical therapy, angina free # ICM LVEF 35% s/p CRT-D -Not on aspirin due to Eliquis use but hold all antiplatelets in the setting of subdural hematoma. -Continue rosuvastatin 10 mg nightly -Patient scheduled for echocardiogram on 11/11/2022.  Since he is in the hospital, will obtain 2D echocardiogram here.  I have spent a total of 75 minutes with patient reviewing chart , telemetry, EKGs, labs and examining patient as well as establishing an assessment and plan that was  discussed with the patient.  > 50% of time was spent in direct patient care.      For questions or updates, please contact [facility] Please consult [email or web address] for contact info under Cardiology/STEMI.   Signed, Herbert Deaner, MD 11/09/2022 4:40 PM

## 2022-11-09 NOTE — ED Triage Notes (Addendum)
Pt reports walking upstairs, and fell backwards, pt hit his head, and no LOC. Per pt daughter pt had A-fib 3 years ago, was cardioverted and has not had issue since until recently, pt started on Eliquis at the time. March pt went back into A-fib, was cardioverted again, and placed on amiodarone. Pt has been more fatigued since March. Pt had pacemaker/defib placed 6 months ago.

## 2022-11-09 NOTE — ED Notes (Signed)
Patient transported to CT 

## 2022-11-09 NOTE — ED Notes (Signed)
Patient was loaded onto the [facility] stretcher for transport.

## 2022-11-09 NOTE — ED Notes (Signed)
ED TO INPATIENT HANDOFF REPORT  ED Nurse Name and Phone #: Morrie Sheldon  [phone]  J Name/Age/Gender [name] [name] 77 y.o. male Room/Bed: APA15/APA15  Code Status   Code Status: Full Code  Home/SNF/Other Home Patient oriented to: self, place, time, and situation Is this baseline? Yes   Triage Complete: Triage complete  Chief Complaint Subdural hematoma (HCC) [S06.5XAA]  Triage Note Pt reports walking upstairs, and fell backwards, pt hit his head, and no LOC. Per pt daughter pt had A-fib 3 years ago, was cardioverted and has not had issue since until recently, pt started on Eliquis at the time. March pt went back into A-fib, was cardioverted again, and placed on amiodarone. Pt has been more fatigued since March. Pt had pacemaker/defib placed 6 months ago.    Allergies Allergies  Allergen Reactions   Entresto [Sacubitril-Valsartan] Other (See Comments)    dizziness   Sacubitril Other (See Comments)     Dizziness    Level of Care/Admitting Diagnosis ED Disposition     ED Disposition  Admit   Condition  --   Comment  Hospital Area: [facility] [[id number]]  Level of Care: Telemetry Medical [[id number]]  May place patient in observation at [facility] or [facility] if equivalent level of care is available:: No  Covid Evaluation: Asymptomatic - no recent exposure (last 10 days) testing not required  Diagnosis: Subdural hematoma [facility]) [[id number]]  Admitting Physician: Tyrone Nine [[id number]]  Attending Physician: Tyrone Nine [email or web address]          B Medical/Surgery History Past Medical History:  Diagnosis Date   Arthritis    Knee L - probably   Atrial fibrillation (HCC)    BPH (benign prostatic hyperplasia)    CAD (coronary artery disease) 07/06/2011   CHF (congestive heart failure) (HCC)    Coronary artery disease    Diabetes mellitus    Frequency of urination    Heart failure (HCC)    High cholesterol    History of nuclear stress test 09/2010   dipyridamole; mild  perfusion defect due to attenuation with mild superimposed ischemia in apical septal, apical, apical inferior & apical lateral regions; rest LV enlarged in size; prominent gut uptake in infero-apical region; no significant ischemia demonstrated; low risk scan    HNP (herniated nucleus pulposus), lumbar    Hypertension    Ischemic cardiomyopathy    LV dysfunction 07/06/2011   Nocturia    Right bundle branch block    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 Wayland Salinas   Sleep apnea    sleep study 10/2010- AHI during total sleep 32.1/hr and during REM 62.3/hr (severe sleep apnea)unable to tolerate c pap   Spinal stenosis of lumbar region    Stroke [facility])    [place]    Past Surgical History:  Procedure Laterality Date   ABDOMINAL AORTOGRAM W/LOWER EXTREMITY Left 11/14/2020   Procedure: ABDOMINAL AORTOGRAM W/LOWER EXTREMITY;  Surgeon: Cephus Shelling, MD;  Location: [facility] INVASIVE CV LAB;  Service: Cardiovascular;  Laterality: Left;   ABDOMINAL AORTOGRAM W/LOWER EXTREMITY N/A 01/02/2021   Procedure: ABDOMINAL AORTOGRAM W/LOWER EXTREMITY;  Surgeon: Cephus Shelling, MD;  Location: [facility] INVASIVE CV LAB;  Service: Cardiovascular;  Laterality: N/A;   AMPUTATION Left 03/20/2021   Procedure: Left foot 5th ray amputation;  Surgeon: Toni Arthurs, MD;  Location: [facility] OR;  Service: Orthopedics;  Laterality: Left;  BIV ICD INSERTION CRT-D N/A 04/30/2022   Procedure: BIV ICD INSERTION CRT-D;  Surgeon: [name], MD;  Location: [facility] INVASIVE CV LAB;  Service: Cardiovascular;  Laterality: N/A;   CARDIAC CATHETERIZATION  01/2011   ischemic cardiomyopathy 30-35%, multivessel CAD (Dr. Bishop Limbo)    CARDIAC CATHETERIZATION  09/13/2015   Procedure: Right/Left Heart Cath and Coronary/Graft Angiography;  Surgeon: [name], MD;  Location: [facility] INVASIVE CV LAB;  Service: Cardiovascular;;   CARDIOVERSION N/A 05/16/2019    Procedure: CARDIOVERSION;  Surgeon: [name], MD;  Location: [facility] ENDOSCOPY;  Service: Cardiovascular;  Laterality: N/A;   CARDIOVERSION N/A 10/16/2022   Procedure: CARDIOVERSION;  Surgeon: Thurmon Fair, MD;  Location: [facility] INVASIVE CV LAB;  Service: Cardiovascular;  Laterality: N/A;   Colonscopy     CORONARY ARTERY BYPASS GRAFT  08/03/2011   Procedure: CORONARY ARTERY BYPASS GRAFTING (CABG);  Surgeon: Alleen Borne, MD;  Location: [facility] OR;  Service: Open Heart Surgery;  Laterality: N/A;  CABG times three using right saphenous vein and left mammary artery usisng endoscope.   LEFT HEART CATH AND CORS/GRAFTS ANGIOGRAPHY N/A 12/15/2021   Procedure: LEFT HEART CATH AND CORS/GRAFTS ANGIOGRAPHY;  Surgeon: Tonny Bollman, MD;  Location: [facility] INVASIVE CV LAB;  Service: Cardiovascular;  Laterality: N/A;   LEFT HEART CATHETERIZATION WITH CORONARY/GRAFT ANGIOGRAM N/A 09/20/2013   Procedure: LEFT HEART CATHETERIZATION WITH Isabel Caprice;  Surgeon: [name], MD;  Location: [facility] CATH LAB;  Service: Cardiovascular;  Laterality: N/A;   Lower Extremity Arterial Doppler  03/16/2013   bilat ABIs demonstated normal values; R runoff - posterior tibial & anterior tibial arteries occluded; L runoff - peroneal & posterior tibial arteries occluded, anterior tibial artery appears occluded   LUMBAR LAMINECTOMY/DECOMPRESSION MICRODISCECTOMY N/A 09/12/2014   Procedure: LUMBAR DECOMPRESSION L3-L4, L4-L5, MICRODISCECTOMY L4-L5;  Surgeon: Jene Every, MD;  Location: [facility] ORS;  Service: Orthopedics;  Laterality: N/A;   PERIPHERAL VASCULAR BALLOON ANGIOPLASTY  11/14/2020   Procedure: PERIPHERAL VASCULAR BALLOON ANGIOPLASTY;  Surgeon: Cephus Shelling, MD;  Location: [facility] INVASIVE CV LAB;  Service: Cardiovascular;;   PERIPHERAL VASCULAR BALLOON ANGIOPLASTY Left 01/02/2021   Procedure: PERIPHERAL VASCULAR BALLOON ANGIOPLASTY;  Surgeon: Cephus Shelling, MD;  Location: [facility] INVASIVE CV LAB;  Service: Cardiovascular;   Laterality: Left;  Failed PTA   Pilonidal Cyst removed     TRANSTHORACIC ECHOCARDIOGRAM  11/10/2012   EF 40-45%, mild LVH, mild hypokinesis of anteroseptal myocardium, grade 1 diastolic dysfunction; mild MR & calcifed mitral annulus; LA mild-mod dilated; RA mildly dilated     A IV Location/Drains/Wounds Patient Lines/Drains/Airways Status     Active Line/Drains/Airways     Name Placement date Placement time Site Days   Peripheral IV 11/09/22 18 G 1" Anterior;Distal;Right;Upper Arm 11/09/22  1300  Arm  less than 1            Intake/Output Last 24 hours  Intake/Output Summary (Last 24 hours) at 11/09/2022 1758 Last data filed at 11/09/2022 1649 Gross per 24 hour  Intake 90 ml  Output --  Net 90 ml    Labs/Imaging Results for orders placed or performed during the hospital encounter of 11/09/22 (from the past 48 hour(s))  Basic metabolic panel     Status: Abnormal   Collection Time: 11/09/22 11:59 AM  Result Value Ref Range   Sodium 136 135 - 145 mmol/L   Potassium 3.4 (L) 3.5 - 5.1 mmol/L   Chloride 99 98 - 111 mmol/L   CO2 28 22 - 32 mmol/L  Glucose, Bld 283 (H) 70 - 99 mg/dL    Comment: Glucose reference range applies only to samples taken after fasting for at least 8 hours.   BUN 26 (H) 8 - 23 mg/dL   Creatinine, Ser 1.61 (H) 0.61 - 1.24 mg/dL   Calcium 8.5 (L) 8.9 - 10.3 mg/dL   GFR, Estimated 52 (L) >60 mL/min    Comment: (NOTE) Calculated using the CKD-EPI Creatinine Equation (2021)    Anion gap 9 5 - 15    Comment: Performed at [facility], [address], [place], [place] [address]  CBC with Differential     Status: Abnormal   Collection Time: 11/09/22 11:59 AM  Result Value Ref Range   WBC 7.5 4.0 - 10.5 K/uL   RBC 4.19 (L) 4.22 - 5.81 MIL/uL   Hemoglobin 11.6 (L) 13.0 - 17.0 g/dL   HCT 54.0 (L) 98.1 - 19.1 %   MCV 83.8 80.0 - 100.0 fL   MCH 27.7 26.0 - 34.0 pg   MCHC 33.0 30.0 - 36.0 g/dL   RDW 47.8 29.5 - 62.1 %   Platelets 232 150 - 400 K/uL    nRBC 0.0 0.0 - 0.2 %   Neutrophils Relative % 70 %   Neutro Abs 5.3 1.7 - 7.7 K/uL   Lymphocytes Relative 14 %   Lymphs Abs 1.0 0.7 - 4.0 K/uL   Monocytes Relative 8 %   Monocytes Absolute 0.6 0.1 - 1.0 K/uL   Eosinophils Relative 6 %   Eosinophils Absolute 0.4 0.0 - 0.5 K/uL   Basophils Relative 1 %   Basophils Absolute 0.1 0.0 - 0.1 K/uL   Immature Granulocytes 1 %   Abs Immature Granulocytes 0.06 0.00 - 0.07 K/uL    Comment: Performed at [facility], [address] [place], [place] [address]  Protime-INR     Status: Abnormal   Collection Time: 11/09/22 11:59 AM  Result Value Ref Range   Prothrombin Time 21.1 (H) 11.4 - 15.2 seconds   INR 1.8 (H) 0.8 - 1.2    Comment: (NOTE) INR goal varies based on device and disease states. Performed at [facility], [address], [place], [place] [address]   CBG monitoring, ED     Status: Abnormal   Collection Time: 11/09/22  4:41 PM  Result Value Ref Range   Glucose-Capillary 191 (H) 70 - 99 mg/dL    Comment: Glucose reference range applies only to samples taken after fasting for at least 8 hours.  CBG monitoring, ED     Status: Abnormal   Collection Time: 11/09/22  5:37 PM  Result Value Ref Range   Glucose-Capillary 185 (H) 70 - 99 mg/dL    Comment: Glucose reference range applies only to samples taken after fasting for at least 8 hours.   Comment 1 Notify RN    Comment 2 Document in Chart    CT Head Wo Contrast  Result Date: 11/09/2022 CLINICAL DATA:  Blunt poly trauma. EXAM: CT HEAD WITHOUT CONTRAST TECHNIQUE: Contiguous axial images were obtained from the base of the skull through the vertex without intravenous contrast. RADIATION DOSE REDUCTION: This exam was performed according to the departmental dose-optimization program which includes automated exposure control, adjustment of the mA and/or kV according to patient size and/or use of iterative reconstruction technique. COMPARISON:  12/19/2017 FINDINGS: Brain: No evidence of acute  infarction, hydrocephalus, or mass. Acute subdural hemorrhage along the anterior falx measuring 4 mm in thickness. Left cerebellar encephalomalacia. Generalized cerebral atrophy. Left subinsular  lacunar infarct. Vascular: No hyperdense vessel. Intracranial atherosclerotic disease. Skull: No osseous abnormality. Sinuses/Orbits: Visualized paranasal sinuses are clear. Visualized mastoid sinuses are clear. Visualized orbits demonstrate no focal abnormality. Other: None IMPRESSION: 1. Acute subdural hemorrhage along the anterior falx measuring 4 mm in thickness. No mass effect. Critical Value/emergent results were called by telephone at the time of interpretation on 11/09/2022 at 12:52 pm to provider JULIE HAVILAND , who verbally acknowledged these results. Electronically Signed   By: Elige Ko M.D.   On: 11/09/2022 12:53   CT Cervical Spine Wo Contrast  Result Date: 11/09/2022 CLINICAL DATA:  Neck trauma (Age >= 65y) EXAM: CT CERVICAL SPINE WITHOUT CONTRAST TECHNIQUE: Multidetector CT imaging of the cervical spine was performed without intravenous contrast. Multiplanar CT image reconstructions were also generated. RADIATION DOSE REDUCTION: This exam was performed according to the departmental dose-optimization program which includes automated exposure control, adjustment of the mA and/or kV according to patient size and/or use of iterative reconstruction technique. COMPARISON:  None Available. FINDINGS: Alignment: Facet joints are aligned without dislocation or traumatic listhesis. Dens and lateral masses are aligned. Skull base and vertebrae: No acute fracture. No primary bone lesion or focal pathologic process. The right C3-4 facet joint is fused. Soft tissues and spinal canal: No prevertebral fluid or swelling. No visible canal hematoma. 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 Disc levels: Relatively mild disc height loss. There is multilevel facet and uncovertebral  arthropathy. Upper chest: Negative. Other: Bilateral carotid atherosclerosis. IMPRESSION: 1. No acute fracture or traumatic listhesis of the cervical spine. 2. Mild-moderate multilevel degenerative changes of the cervical spine. Electronically Signed   By: Duanne Guess D.O.   On: 11/09/2022 12:49   DG HIP UNILAT WITH PELVIS 2-3 VIEWS LEFT  Result Date: 11/09/2022 CLINICAL DATA:  Fall with left hip pain. EXAM: DG HIP (WITH OR WITHOUT PELVIS) 2-3V LEFT COMPARISON:  None Available. 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 No acute fracture or dislocation. Degenerative changes of the spine and sacroiliac joints. IMPRESSION: 1. No acute findings. 2. 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 Electronically Signed   By: [name] M.D.   On: 11/09/2022 12:47    Pending Labs Unresulted Labs (From admission, onward)     Start     Ordered   11/10/22 0500  Basic metabolic panel  Tomorrow morning,   R        11/09/22 1617   11/10/22 0500  CBC  Tomorrow morning,   R        11/09/22 1617   11/10/22 0500  Protime-INR  Tomorrow morning,   R        11/09/22 1617            Vitals/Pain Today's Vitals   11/09/22 1715 11/09/22 1730 11/09/22 1745 11/09/22 1746  BP: 139/66 (!) 137/59 137/60 137/60  Pulse: (!) 56 (!) 54 (!) 51 (!) 51  Resp: 13 12 17    Temp:      TempSrc:      SpO2: 100% 92% 98%   Weight:      Height:        Isolation Precautions No active isolations  Medications Medications  acetaminophen (TYLENOL) tablet 500-1,000 mg (1,000 mg Oral Given 11/09/22 1641)  amiodarone (PACERONE) tablet 200 mg (has no administration in time range)  carvedilol (COREG) tablet 3.125 mg (3.125 mg Oral Not Given 11/09/22 1746)  furosemide (LASIX) tablet 40 mg (40 mg Oral Given 11/09/22 1746)  rosuvastatin (CRESTOR) tablet 10 mg (has no administration in time range)  gabapentin (NEURONTIN) capsule 600 mg (has no administration in time range)  insulin aspart  (novoLOG) injection 0-9 Units (2 Units Subcutaneous Given 11/09/22 1745)  insulin aspart (novoLOG) injection 0-5 Units (has no administration in time range)  sodium chloride flush (NS) 0.9 % injection 3 mL (has no administration in time range)  coag fact Xa recombinant (ANDEXXA) low dose infusion 900 mg (0 mg Intravenous Stopped 11/09/22 1649)  potassium chloride SA (KLOR-CON M) CR tablet 40 mEq (40 mEq Oral Given 11/09/22 1641)    Mobility walks     Focused Assessments Cardiac Assessment Handoff:  Cardiac Rhythm: Atrial fibrillation Lab Results  Component Value Date   CKTOTAL 183 06/03/2011   CKMB 2.3 06/03/2011   TROPONINI <0.03 08/20/2015   Lab Results  Component Value Date   DDIMER 0.40 08/20/2015   Does the Patient currently have chest pain? No   , Neuro Assessment Handoff:  Swallow screen pass? Yes  Cardiac Rhythm: Atrial fibrillation NIH Stroke Scale  Dizziness Present: No Headache Present: Yes Interval: Shift assessment Level of Consciousness (1a.)   : Alert, keenly responsive LOC Questions (1b. )   : Answers both questions correctly LOC Commands (1c. )   : Performs both tasks correctly Best Gaze (2. )  : Normal Visual (3. )  : No visual loss Facial Palsy (4. )    : Normal symmetrical movements Motor Arm, Left (5a. )   : No drift Motor Arm, Right (5b. ) : No drift Motor Leg, Left (6a. )  : No drift Motor Leg, Right (6b. ) : No drift Limb Ataxia (7. ): Absent Sensory (8. )  : Normal, no sensory loss Best Language (9. )  : No aphasia Dysarthria (10. ): Normal Extinction/Inattention (11.)   : No Abnormality Complete NIHSS TOTAL: 0     Neuro Assessment:   Neuro Checks:   Initial (11/09/22 1350)  Has TPA been given? No If patient is a Neuro Trauma and patient is going to OR before floor call report to 4N Charge nurse: [phone] or [phone]   R Recommendations: See Admitting Provider Note  Report given to:   Additional Notes:

## 2022-11-09 NOTE — Plan of Care (Signed)

## 2022-11-09 NOTE — H&P (Signed)
History and Physical    Patient: [name] [name] WUJ:[id number] DOB: 1946-04-07 DOA: 11/09/2022 DOS: the patient was seen and examined on 11/09/2022 PCP: [name], MD  Patient coming from: Home  Chief Complaint:  Chief Complaint  Patient presents with   Fall   HPI: [name] [name] is a 77 y.o. male with medical history of AFib on eliquis, CAD s/p CABG,  HFrEF, RBBB s/p PPM/ICD, T2DM, HLD who presented to the ED today after falling last night. Around 8:30pm he was climbing a stair and next thing he knew he was falling backward and landed on his back striking the back of his head. He had no LOC or lightheadedness/dizziness, or any giving out of muscles or focal numbness or weakness before or after. He simply got up and went to clean up some bleeding on the back of his head. He had mild pain in the left leg but not severe. Pain was still there this morning and he had some bleeding when he woke up from the head wound so he came to the ED.   Here he was found to have a 4mm SDH along the anterior falx without mass effect. Neurosurgery was contacted, recommending reversal and overnight observation with CT in the AM. Cardiology was consulted due to family reluctance to hold anticoagulation. Hospitalist consulted to facilitate admission.   Review of Systems: As mentioned in the history of present illness. All other systems reviewed and are negative. Past Medical History:  Diagnosis Date   Arthritis    Knee L - probably   Atrial fibrillation (HCC)    BPH (benign prostatic hyperplasia)    CAD (coronary artery disease) 07/06/2011   CHF (congestive heart failure) (HCC)    Coronary artery disease    Diabetes mellitus    Frequency of urination    Heart failure (HCC)    High cholesterol    History of nuclear stress test 09/2010   dipyridamole; mild perfusion defect due to attenuation with mild superimposed ischemia in apical septal, apical, apical inferior & apical lateral regions; rest LV  enlarged in size; prominent gut uptake in infero-apical region; no significant ischemia demonstrated; low risk scan    HNP (herniated nucleus pulposus), lumbar    Hypertension    Ischemic cardiomyopathy    LV dysfunction 07/06/2011   Nocturia    Right bundle branch block    S/P CABG (coronary artery bypass graft) 08/03/2011   x3; LIMA to LAD,, SVG to PDA, SVG to posterolateral branch of RCA; Dr. Wayland Salinas   Sleep apnea    sleep study 10/2010- AHI during total sleep 32.1/hr and during REM 62.3/hr (severe sleep apnea)unable to tolerate c pap   Spinal stenosis of lumbar region    Stroke [facility])    [place]    Past Surgical History:  Procedure Laterality Date   ABDOMINAL AORTOGRAM W/LOWER EXTREMITY Left 11/14/2020   Procedure: ABDOMINAL AORTOGRAM W/LOWER EXTREMITY;  Surgeon: Cephus Shelling, MD;  Location: [facility] INVASIVE CV LAB;  Service: Cardiovascular;  Laterality: Left;   ABDOMINAL AORTOGRAM W/LOWER EXTREMITY N/A 01/02/2021   Procedure: ABDOMINAL AORTOGRAM W/LOWER EXTREMITY;  Surgeon: Cephus Shelling, MD;  Location: [facility] INVASIVE CV LAB;  Service: Cardiovascular;  Laterality: N/A;   AMPUTATION Left 03/20/2021   Procedure: Left foot 5th ray amputation;  Surgeon: Toni Arthurs, MD;  Location: [facility] OR;  Service: Orthopedics;  Laterality: Left;   BIV ICD INSERTION CRT-D N/A 04/30/2022   Procedure: BIV ICD INSERTION CRT-D;  Surgeon: [name],  MD;  Location: [facility] INVASIVE CV LAB;  Service: Cardiovascular;  Laterality: N/A;   CARDIAC CATHETERIZATION  01/2011   ischemic cardiomyopathy 30-35%, multivessel CAD (Dr. Bishop Limbo)    CARDIAC CATHETERIZATION  09/13/2015   Procedure: Right/Left Heart Cath and Coronary/Graft Angiography;  Surgeon: [name], MD;  Location: [facility] INVASIVE CV LAB;  Service: Cardiovascular;;   CARDIOVERSION N/A 05/16/2019   Procedure: CARDIOVERSION;  Surgeon: [name], MD;  Location: [facility] ENDOSCOPY;  Service: Cardiovascular;  Laterality: N/A;   CARDIOVERSION N/A  10/16/2022   Procedure: CARDIOVERSION;  Surgeon: Thurmon Fair, MD;  Location: [facility] INVASIVE CV LAB;  Service: Cardiovascular;  Laterality: N/A;   Colonscopy     CORONARY ARTERY BYPASS GRAFT  08/03/2011   Procedure: CORONARY ARTERY BYPASS GRAFTING (CABG);  Surgeon: Alleen Borne, MD;  Location: [facility] OR;  Service: Open Heart Surgery;  Laterality: N/A;  CABG times three using right saphenous vein and left mammary artery usisng endoscope.   LEFT HEART CATH AND CORS/GRAFTS ANGIOGRAPHY N/A 12/15/2021   Procedure: LEFT HEART CATH AND CORS/GRAFTS ANGIOGRAPHY;  Surgeon: Tonny Bollman, MD;  Location: [facility] INVASIVE CV LAB;  Service: Cardiovascular;  Laterality: N/A;   LEFT HEART CATHETERIZATION WITH CORONARY/GRAFT ANGIOGRAM N/A 09/20/2013   Procedure: LEFT HEART CATHETERIZATION WITH Isabel Caprice;  Surgeon: [name], MD;  Location: [facility] CATH LAB;  Service: Cardiovascular;  Laterality: N/A;   Lower Extremity Arterial Doppler  03/16/2013   bilat ABIs demonstated normal values; R runoff - posterior tibial & anterior tibial arteries occluded; L runoff - peroneal & posterior tibial arteries occluded, anterior tibial artery appears occluded   LUMBAR LAMINECTOMY/DECOMPRESSION MICRODISCECTOMY N/A 09/12/2014   Procedure: LUMBAR DECOMPRESSION L3-L4, L4-L5, MICRODISCECTOMY L4-L5;  Surgeon: Jene Every, MD;  Location: [facility] ORS;  Service: Orthopedics;  Laterality: N/A;   PERIPHERAL VASCULAR BALLOON ANGIOPLASTY  11/14/2020   Procedure: PERIPHERAL VASCULAR BALLOON ANGIOPLASTY;  Surgeon: Cephus Shelling, MD;  Location: [facility] INVASIVE CV LAB;  Service: Cardiovascular;;   PERIPHERAL VASCULAR BALLOON ANGIOPLASTY Left 01/02/2021   Procedure: PERIPHERAL VASCULAR BALLOON ANGIOPLASTY;  Surgeon: Cephus Shelling, MD;  Location: [facility] INVASIVE CV LAB;  Service: Cardiovascular;  Laterality: Left;  Failed PTA   Pilonidal Cyst removed     TRANSTHORACIC ECHOCARDIOGRAM  11/10/2012   EF 40-45%, mild LVH, mild hypokinesis of  anteroseptal myocardium, grade 1 diastolic dysfunction; mild MR & calcifed mitral annulus; LA mild-mod dilated; RA mildly dilated   Social History:  reports that he quit smoking about 11 years ago. His smoking use included cigars. He quit smokeless tobacco use about 10 years ago. He reports current alcohol use of about 1.0 - 2.0 standard drink of alcohol per week. He reports that he does not use drugs.  Allergies  Allergen Reactions   Entresto [Sacubitril-Valsartan] Other (See Comments)    dizziness   Sacubitril Other (See Comments)     Dizziness    Family History  Problem Relation Age of Onset   Cancer Mother    Lung disease Father        & heart disease   Colon polyps Father    Colon cancer Sister    Sudden death Maternal Grandmother    Anesthesia problems Daughter     Prior to Admission medications   Medication Sig Start Date End Date Taking? Authorizing Provider  acetaminophen (TYLENOL) 500 MG tablet Take 500-1,000 mg by mouth every 6 (six) hours as needed (for pain.).   Yes [provider]  amiodarone (PACERONE) 200 MG tablet Take 2  tablets (400 mg total) TWICE a day for 2 weeks, then take 1 tablet (200 mg total) TWICE a day for 2 weeks, then take 1 tablet (200 mg total) ONCE a day thereafter Patient taking differently: Take 200 mg by mouth daily. 10/27/22  Yes Camnitz, Andree Coss, MD  aspirin EC 81 MG tablet Take 81 mg by mouth at bedtime. Swallow whole.   Yes [provider]  carvedilol (COREG) 3.125 MG tablet Take 1 tablet (3.125 mg total) by mouth 2 (two) times daily with a meal. 08/20/22  Yes [name], Lisette Abu, MD  ELIQUIS 5 MG TABS tablet Take 1 tablet by mouth twice daily Patient taking differently: Take 5 mg by mouth 2 (two) times daily. 03/20/20  Yes [name], Lisette Abu, MD  empagliflozin (JARDIANCE) 25 MG TABS tablet Take 1 tablet (25 mg total) by mouth daily. 08/20/22  Yes [name], Lisette Abu, MD  furosemide (LASIX) 40 MG tablet Take 1 1/2 tablet by mouth in  the morning and 1 tablet by mouth in the afternoon for 4 days then go back down to 1 tablet by mouth twice a day Patient taking differently: Take 40 mg by mouth 2 (two) times daily. Take 1 1/2 tablet by mouth in the morning and 1 tablet by mouth in the afternoon for 4 days then go back down to 1 tablet by mouth twice a day 10/22/22  Yes Duke, Roe Rutherford, PA  gabapentin (NEURONTIN) 300 MG capsule Take 600 mg by mouth at bedtime.   Yes [provider]  glipiZIDE (GLUCOTROL XL) 10 MG 24 hr tablet Take 10 mg by mouth at bedtime.   Yes [provider]  metFORMIN (GLUCOPHAGE-XR) 500 MG 24 hr tablet Take 1,000 mg by mouth 2 (two) times daily. 07/05/15  Yes [provider]  rosuvastatin (CRESTOR) 10 MG tablet Take 1 tablet (10 mg total) by mouth every other day. 10/22/22  Yes Duke, Roe Rutherford, PA  [facility] VERIO test strip 1 each daily. 08/16/19   [provider]    Physical Exam: Vitals:   11/09/22 1400 11/09/22 1430 11/09/22 1445 11/09/22 1505  BP: 134/84 138/67 136/66   Pulse: (!) 58 (!) 57 (!) 58   Resp: 18     Temp:    98 F (36.7 C)  TempSrc:    Oral  SpO2: 97% 98% 99%   Weight:      Height:      Gen: Elderly well-appearing male in no distress Pulm: Clear, nonlabored  CV: Irreg borderline bradycardia, II/VI systolic murmur at apex, 1+ symmetric pitting edema.  GI: Soft, NT, ND, +BS  Neuro: Alert and oriented. No new focal deficits. Ext: Warm, no deformities. LLE with minimal pain on AROM.  Skin: Left of the occiput is shallow abrasion without active bleeding. Not deep. No other rashes, lesions or ulcers on visualized skin   Data Reviewed: Hgb 11.6 K 3.4 BUN 26, Cr 1.39, glucose 283  CT head: 4mm subdural hematoma at anterior falx without mass effect.  CT cervical spine: No acute fracture or dislocation. Chronic multilevel degenerative disease noted L hip XR: No acute abnormality. Bilateral hip OA noted.  ECG: AFib w/rate in 50's, RBBB  morphology.  Assessment and Plan: Traumatic subdural hematoma:  - Continue q4h neuro checks - CT head to be repeated in AM or prn neurological change - No neurosurgery formal evaluation unless neurological change or worsening CT.  - Anticoagulation held, recombinant factor X1 given in ED.   Fall at home: Does  not appear to be syncope. Has history of neuropathy which may have contributed.   - PT eval - Cardiac monitoring. - Continue gabapentin qHS home med. - CT C spine and hip radiographs without acute abnormality.   Persistent atrial fibrillation: Has had multiple cardioversions, confirmed to be in AFib here with slow ventricular response. s/p PPM/ICD.  - Continue amiodarone 200mg  daily - Continue coreg low dose - Appears to have ablation scheduled 01/19/2023 with Dr. [name].  - Holding anticoagulation as above. Last echo specifically makes note of no intracardiac thrombus.  Chronic HFrEF, ICM, CAD s/p CABG 2013, HLD: Last echo June 2023 w/LVEF 30-35%, RWMA, mod septal LVH, normal RV, severe MR, mild-mod TR. - Continue home lasix 40mg  BID, coreg. Has allergy to entresto listed. Holding jardiance with mild AKI. - Continue rosuvastatin  T2DM:  - Hold metformin, continue jardiance (took this AM), give sensitive SSI while admitted    Hypokalemia:  - Supplement and monitor  Mild AKI: SCr 1.3 from baseline ~1.0.  - Treat supportively, allow diet for rehydration. Appears to have edema, so will continue his regular lasix. Ok to hold SGLT2i for right now (though he took it this morning).   Normocytic anemia: Near baseline.  - Monitor in AM.  Scalp abrasion left posterior scalp with hemostatic abrasion not requiring staples.  - Keep covered.  - Tylenol prn   Advance Care Planning: Full (discussed with patient at admission)  Consults: Neurosurgery, Dr. Wynetta Emery. Cardiology, Dr. Jenene Slicker  Family Communication: None at bedside  Severity of Illness: The appropriate patient status for  this patient is OBSERVATION. Observation status is judged to be reasonable and necessary in order to provide the required intensity of service to ensure the patient's safety. The patient's presenting symptoms, physical exam findings, and initial radiographic and laboratory data in the context of their medical condition is felt to place them at decreased risk for further clinical deterioration. Furthermore, it is anticipated that the patient will be medically stable for discharge from the hospital within 2 midnights of admission.   Author: Tyrone Nine, MD 11/09/2022 3:10 PM  For on call review [email or web address].

## 2022-11-10 ENCOUNTER — Observation Stay (HOSPITAL_COMMUNITY): Payer: Medicare Other

## 2022-11-10 ENCOUNTER — Observation Stay (HOSPITAL_BASED_OUTPATIENT_CLINIC_OR_DEPARTMENT_OTHER): Payer: Medicare Other

## 2022-11-10 DIAGNOSIS — I4891 Unspecified atrial fibrillation: Secondary | ICD-10-CM | POA: Diagnosis not present

## 2022-11-10 DIAGNOSIS — S065XAA Traumatic subdural hemorrhage with loss of consciousness status unknown, initial encounter: Secondary | ICD-10-CM | POA: Diagnosis not present

## 2022-11-10 DIAGNOSIS — S065X0A Traumatic subdural hemorrhage without loss of consciousness, initial encounter: Secondary | ICD-10-CM | POA: Diagnosis not present

## 2022-11-10 LAB — CBC
HCT: 35.1 % — ABNORMAL LOW (ref 39.0–52.0)
Hemoglobin: 11.5 g/dL — ABNORMAL LOW (ref 13.0–17.0)
MCH: 27.4 pg (ref 26.0–34.0)
MCHC: 32.8 g/dL (ref 30.0–36.0)
MCV: 83.6 fL (ref 80.0–100.0)
Platelets: 213 10*3/uL (ref 150–400)
RBC: 4.2 MIL/uL — ABNORMAL LOW (ref 4.22–5.81)
RDW: 15.3 % (ref 11.5–15.5)
WBC: 6.8 10*3/uL (ref 4.0–10.5)
nRBC: 0 % (ref 0.0–0.2)

## 2022-11-10 LAB — ECHOCARDIOGRAM COMPLETE
AR max vel: 1.93 cm2
AV Area VTI: 2.75 cm2
AV Area mean vel: 2.71 cm2
AV Mean grad: 3 mmHg
AV Peak grad: 5.4 mmHg
Ao pk vel: 1.16 m/s
Area-P 1/2: 4.78 cm2
Height: 74 in
MV M vel: 4.63 m/s
MV Peak grad: 85.7 mmHg
S' Lateral: 5.5 cm
Weight: 3440 oz

## 2022-11-10 LAB — GLUCOSE, CAPILLARY
Glucose-Capillary: 131 mg/dL — ABNORMAL HIGH (ref 70–99)
Glucose-Capillary: 246 mg/dL — ABNORMAL HIGH (ref 70–99)
Glucose-Capillary: 274 mg/dL — ABNORMAL HIGH (ref 70–99)
Glucose-Capillary: 221 mg/dL — ABNORMAL HIGH (ref 70–99)

## 2022-11-10 LAB — BRAIN NATRIURETIC PEPTIDE: B Natriuretic Peptide: 723.8 pg/mL — ABNORMAL HIGH (ref 0.0–100.0)

## 2022-11-10 LAB — BASIC METABOLIC PANEL
Anion gap: 13 (ref 5–15)
BUN: 21 mg/dL (ref 8–23)
CO2: 25 mmol/L (ref 22–32)
Calcium: 8.4 mg/dL — ABNORMAL LOW (ref 8.9–10.3)
Chloride: 101 mmol/L (ref 98–111)
Creatinine, Ser: 1.25 mg/dL — ABNORMAL HIGH (ref 0.61–1.24)
GFR, Estimated: 59 mL/min — ABNORMAL LOW (ref >60)
Glucose, Bld: 146 mg/dL — ABNORMAL HIGH (ref 70–99)
Potassium: 3.8 mmol/L (ref 3.5–5.1)
Sodium: 139 mmol/L (ref 135–145)

## 2022-11-10 LAB — PROTIME-INR
INR: 1.5 — ABNORMAL HIGH (ref 0.8–1.2)
Prothrombin Time: 18.5 seconds — ABNORMAL HIGH (ref 11.4–15.2)

## 2022-11-10 LAB — TSH: TSH: 1.624 u[IU]/mL (ref 0.350–4.500)

## 2022-11-10 LAB — MAGNESIUM: Magnesium: 1.9 mg/dL (ref 1.7–2.4)

## 2022-11-10 MED ORDER — PERFLUTREN LIPID MICROSPHERE
1.0000 mL | INTRAVENOUS | Status: AC | PRN
  Administered 2022-11-10: 2 mL via INTRAVENOUS

## 2022-11-10 NOTE — Evaluation (Signed)
Occupational Therapy Evaluation Patient Details Name: [name] [name] MRN: [id number] DOB: 03-Apr-1946 Today's Date: 11/10/2022   History of Present Illness [name] [name] is a 77 year old male admitted with mechanical fall resulting in subdural hematoma 4mm without any mas effect. Eliquis was held and reversal antibiotic was administered. Neurosurgery recommending reversal and overnight observation with CT in the morning. PMH significant for afib (on eliquis), DM, CAD, HLD, ischemic cardiomyopathy, CVA, arthritis, CHF and sleep apnea.   Clinical Impression   Pt reports PTA, he was independent with ADL/IADL and functional mobility. Pt reports he was driving. Pt currently requires minguard assistance for toilet transfer and standing at sink level for grooming due to slight instability and reports of dizziness/lightheadedness following BM. Pt returned to recliner, BP 96/71mmHg. Sitting for BP 126/60mmHg with resolved symptoms. Will continue to follow acutely. Anticipate pt will continue to progress to d/c home with support from his daughter and significant other without need for follow-up OT.       Recommendations for follow up therapy are one component of a multi-disciplinary discharge planning process, led by the attending physician.  Recommendations may be updated based on patient status, additional functional criteria and insurance authorization.   Assistance Recommended at Discharge Intermittent Supervision/Assistance  Patient can return home with the following A little help with walking and/or transfers;A little help with bathing/dressing/bathroom;Assistance with cooking/housework    Functional Status Assessment  Patient has had a recent decline in their functional status and demonstrates the ability to make significant improvements in function in a reasonable and predictable amount of time.  Equipment Recommendations  None recommended by OT    Recommendations for Other Services        Precautions / Restrictions Precautions Precautions: Fall Precaution Comments: SDH      Mobility Bed Mobility               General bed mobility comments: pt sitting in recliner upon arrival    Transfers Overall transfer level: Needs assistance Equipment used: None Transfers: Sit to/from Stand Sit to Stand: Min guard           General transfer comment: minguard for safety      Balance Overall balance assessment: Mild deficits observed, not formally tested                                         ADL either performed or assessed with clinical judgement   ADL Overall ADL's : Needs assistance/impaired Eating/Feeding: Independent   Grooming: Min guard;Standing Grooming Details (indicate cue type and reason): pt reports of dizziness while standing, assistance for safety Upper Body Bathing: Set up;Sitting   Lower Body Bathing: Set up;Sit to/from stand   Upper Body Dressing : Set up;Sitting   Lower Body Dressing: Min guard;Sit to/from stand   Toilet Transfer: Min guard;Ambulation   Toileting- Clothing Manipulation and Hygiene: Min guard;Sit to/from stand Toileting - Clothing Manipulation Details (indicate cue type and reason): pt had BM     Functional mobility during ADLs: Min guard General ADL Comments: pt limited following BM with report of dizziness/lightheadedness BP 96/71mmHg upon return to sitting in recliner     Vision         Perception     Praxis      Pertinent Vitals/Pain Pain Assessment Pain Assessment: No/denies pain     Hand Dominance Right   Extremity/Trunk Assessment Upper Extremity Assessment Upper  Extremity Assessment: Overall WFL for tasks assessed   Lower Extremity Assessment Lower Extremity Assessment: Defer to PT evaluation   Cervical / Trunk Assessment Cervical / Trunk Assessment: Normal   Communication Communication Communication: No difficulties   Cognition Arousal/Alertness:  Awake/alert Behavior During Therapy: WFL for tasks assessed/performed Overall Cognitive Status: Within Functional Limits for tasks assessed                                       General Comments       Exercises     Shoulder Instructions      Home Living Family/patient expects to be discharged to:: Private residence Living Arrangements: Alone Available Help at Discharge: Family;Available PRN/intermittently Type of Home: House Home Access: Stairs to enter [employer] of Steps: 2 Entrance Stairs-Rails: Right Home Layout: One level     Bathroom Shower/Tub: Producer, television/film/video: Handicapped height     Home Equipment: Grab bars - tub/shower;Cane - single Librarian, academic (2 wheels)   Additional Comments: pt reports his sister and brother in-law live close by, reports his significant other can assist him as needed      Prior Functioning/Environment Prior Level of Function : Independent/Modified Independent;Driving             Mobility Comments: denies other falls besides the one causing admission, pt was driving          OT Problem List: Impaired balance (sitting and/or standing);Decreased activity tolerance      OT Treatment/Interventions: Self-care/ADL training;Energy conservation;Patient/family education;Balance training    OT Goals(Current goals can be found in the care plan section) Acute Rehab OT Goals Patient Stated Goal: to go home OT Goal Formulation: With patient Time For Goal Achievement: 11/24/22 Potential to Achieve Goals: Good ADL Goals Pt Will Perform Lower Body Dressing: with modified independence;sit to/from stand Pt Will Transfer to Toilet: with modified independence;ambulating Additional ADL Goal #1: Pt will demonstrate independence with 3 fall prevention strategies.  OT Frequency: Min 2X/week    Co-evaluation PT/OT/SLP Co-Evaluation/Treatment: Yes Reason for Co-Treatment: To address  functional/ADL transfers;For patient/therapist safety   OT goals addressed during session: ADL's and self-care      AM-PAC OT "6 Clicks" Daily Activity     Outcome Measure Help from another person eating meals?: None Help from another person taking care of personal grooming?: A Little Help from another person toileting, which includes using toliet, bedpan, or urinal?: A Little Help from another person bathing (including washing, rinsing, drying)?: A Little Help from another person to put on and taking off regular upper body clothing?: None Help from another person to put on and taking off regular lower body clothing?: A Little 6 Click Score: 20   End of Session Equipment Utilized During Treatment: Gait belt Nurse Communication: Mobility status  Activity Tolerance: Patient tolerated treatment well Patient left: in chair;with call bell/phone within reach;with chair alarm set  OT Visit Diagnosis: Unsteadiness on feet (R26.81);Muscle weakness (generalized) (M62.81);History of falling (Z91.81)                Time: 1610-9604 OT Time Calculation (min): 23 min Charges:  OT General Charges $OT Visit: 1 Visit OT Evaluation $OT Eval Moderate Complexity: 1 Mod  Willodean Leven OTR/L Acute Rehabilitation Services Office: [phone]   Rebeca Alert 11/10/2022, 12:14 PM

## 2022-11-10 NOTE — Progress Notes (Unsigned)
No ICM remote transmission received for 11/09/2022 due to currently hospitalized and next ICM transmission scheduled for 11/16/2022.

## 2022-11-10 NOTE — Progress Notes (Signed)
Rounding Note    Patient Name: [name] BLUNK Date of Encounter: 11/10/2022  [facility] Cardiologist: [name], MD   Subjective   No cardiovascular complaints at rest.  He has substantial problems with fatigue when he is in atrial fibrillation. Early recurrence of atrial fibrillation after cardioversion a few weeks ago. He recalls falling and hitting the ground with his head.  He did not have loss of consciousness.  He was coming down the stairs when this happened. Denies headache or any focal neurological complaints at this time. Comprehensive pacemaker interrogation performed.  Device function is normal.  He has an underlying rhythm (atrial fibrillation with a ventricular rate in the 40s-50s).  He is not device dependent.  There have been no episodes of ventricular arrhythmia.  He has been in persistent atrial fibrillation starting a day or 2 after he had successful cardioversion.  Inpatient Medications    Scheduled Meds:  amiodarone  200 mg Oral Daily   carvedilol  3.125 mg Oral BID WC   furosemide  40 mg Oral BID   gabapentin  600 mg Oral QHS   insulin aspart  0-5 Units Subcutaneous QHS   insulin aspart  0-9 Units Subcutaneous TID WC   rosuvastatin  10 mg Oral QODAY   sodium chloride flush  3 mL Intravenous Q12H   Continuous Infusions:  PRN Meds: acetaminophen   Vital Signs    Vitals:   11/10/22 0200 11/10/22 0400 11/10/22 0600 11/10/22 0800  BP:  (!) 129/59  134/67  Pulse: (!) 50 (!) 50 (!) 53 (!) 54  Resp: 17 18 17 16   Temp:  97.8 F (36.6 C)  98.8 F (37.1 C)  TempSrc:  Oral  Oral  SpO2: 98% 99% 99% 93%  Weight:      Height:        Intake/Output Summary (Last 24 hours) at 11/10/2022 1115 Last data filed at 11/09/2022 1649 Gross per 24 hour  Intake 90 ml  Output --  Net 90 ml      11/09/2022   11:09 AM 10/27/2022    2:45 PM 10/22/2022    2:46 PM  Last 3 Weights  Weight (lbs) 215 lb 217 lb 219 lb  Weight (kg) 97.523 kg 98.431 kg  99.338 kg      Telemetry    Mostly ventricular paced rhythm, background atrial fibrillation.  No evidence of RVR.- Personally Reviewed  ECG    Relation with ventricular paced rhythm.  There is a single nonpaced beat, wide-complex.- Personally Reviewed  Physical Exam  Appears comfortable GEN: No acute distress.   Neck: No JVD Cardiac: RRR, paradoxically split second heart sound, no murmurs, rubs, or gallops.  Respiratory: Clear to auscultation bilaterally. GI: Soft, nontender, non-distended  MS: No edema; No deformity. Neuro:  Nonfocal  Psych: Normal affect   Labs    High Sensitivity Troponin:  No results for input(s): "TROPONINIHS" in the last 720 hours.   Chemistry Recent Labs  Lab 11/09/22 1159 11/10/22 0600  NA 136 139  K 3.4* 3.8  CL 99 101  CO2 28 25  GLUCOSE 283* 146*  BUN 26* 21  CREATININE 1.39* 1.25*  CALCIUM 8.5* 8.4*  MG  --  1.9  GFRNONAA 52* 59*  ANIONGAP 9 13    Lipids No results for input(s): "CHOL", "TRIG", "HDL", "LABVLDL", "LDLCALC", "CHOLHDL" in the last 168 hours.  Hematology Recent Labs  Lab 11/09/22 1159 11/10/22 0600  WBC 7.5 6.8  RBC 4.19* 4.20*  HGB 11.6* 11.5*  HCT 35.1* 35.1*  MCV 83.8 83.6  MCH 27.7 27.4  MCHC 33.0 32.8  RDW 15.4 15.3  PLT 232 213   Thyroid  Recent Labs  Lab 11/10/22 0600  TSH 1.624    BNP Recent Labs  Lab 11/10/22 0600  BNP 723.8*    DDimer No results for input(s): "DDIMER" in the last 168 hours.   Radiology    CT Head Wo Contrast  Result Date: 11/09/2022 CLINICAL DATA:  Blunt poly trauma. EXAM: CT HEAD WITHOUT CONTRAST TECHNIQUE: Contiguous axial images were obtained from the base of the skull through the vertex without intravenous contrast. RADIATION DOSE REDUCTION: This exam was performed according to the departmental dose-optimization program which includes automated exposure control, adjustment of the mA and/or kV according to patient size and/or use of iterative reconstruction technique.  COMPARISON:  12/19/2017 FINDINGS: Brain: No evidence of acute infarction, hydrocephalus, or mass. Acute subdural hemorrhage along the anterior falx measuring 4 mm in thickness. Left cerebellar encephalomalacia. Generalized cerebral atrophy. Left subinsular lacunar infarct. Vascular: No hyperdense vessel. Intracranial atherosclerotic disease. Skull: No osseous abnormality. Sinuses/Orbits: Visualized paranasal sinuses are clear. Visualized mastoid sinuses are clear. Visualized orbits demonstrate no focal abnormality. Other: None IMPRESSION: 1. Acute subdural hemorrhage along the anterior falx measuring 4 mm in thickness. No mass effect. Critical Value/emergent results were called by telephone at the time of interpretation on 11/09/2022 at 12:52 pm to provider JULIE HAVILAND , who verbally acknowledged these results. Electronically Signed   By: Elige Ko M.D.   On: 11/09/2022 12:53   CT Cervical Spine Wo Contrast  Result Date: 11/09/2022 CLINICAL DATA:  Neck trauma (Age >= 65y) EXAM: CT CERVICAL SPINE WITHOUT CONTRAST TECHNIQUE: Multidetector CT imaging of the cervical spine was performed without intravenous contrast. Multiplanar CT image reconstructions were also generated. RADIATION DOSE REDUCTION: This exam was performed according to the departmental dose-optimization program which includes automated exposure control, adjustment of the mA and/or kV according to patient size and/or use of iterative reconstruction technique. COMPARISON:  None Available. FINDINGS: Alignment: Facet joints are aligned without dislocation or traumatic listhesis. Dens and lateral masses are aligned. Skull base and vertebrae: No acute fracture. No primary bone lesion or focal pathologic process. The right C3-4 facet joint is fused. Soft tissues and spinal canal: No prevertebral fluid or swelling. No visible canal hematoma. 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 Disc levels: Relatively mild  disc height loss. There is multilevel facet and uncovertebral arthropathy. Upper chest: Negative. Other: Bilateral carotid atherosclerosis. IMPRESSION: 1. No acute fracture or traumatic listhesis of the cervical spine. 2. Mild-moderate multilevel degenerative changes of the cervical spine. Electronically Signed   By: Duanne Guess D.O.   On: 11/09/2022 12:49   DG HIP UNILAT WITH PELVIS 2-3 VIEWS LEFT  Result Date: 11/09/2022 CLINICAL DATA:  Fall with left hip pain. EXAM: DG HIP (WITH OR WITHOUT PELVIS) 2-3V LEFT COMPARISON:  None Available. 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 No acute fracture or dislocation. Degenerative changes of the spine and sacroiliac joints. IMPRESSION: 1. No acute findings. 2. 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 Electronically Signed   By: [name] M.D.   On: 11/09/2022 12:47    Cardiac Studies   Echocardiogram 12/05/2021:   1. Left ventricular ejection fraction, by estimation, is 30 to 35%. The  left ventricle has moderately decreased function. The left ventricle  demonstrates regional wall motion abnormalities (see scoring  diagram/findings for  description). The left  ventricular internal cavity size was mildly to moderately dilated. There  is moderate asymmetric left ventricular hypertrophy of the septal segment.  Left ventricular diastolic parameters are indeterminate.   2. Right ventricular systolic function is normal. The right ventricular  size is normal.   3. Left atrial size was mildly dilated.   4. Right atrial size was mildly dilated.   5. The mitral valve is normal in structure. Severe mitral valve  regurgitation. No evidence of mitral stenosis.   6. Tricuspid valve regurgitation is mild to moderate.   7. The aortic valve is normal in structure. Aortic valve regurgitation is  not visualized. No aortic stenosis is present.   Conclusion(s)/Recommendation(s): No left ventricular mural or apical   thrombus/thrombi.    Cardiac catheterization 12/15/2021:     1.  Severe native coronary artery disease with patency of the left main, total occlusion of the LAD just after the first diagonal, multiple severe calcific stenoses throughout the entirety of the circumflex distribution, and moderate stenoses in the RCA 2.  Status post CABG with continued patency of the LIMA to LAD, and interval occlusion of the SVG to PDA and SVG to PLA 3.  Normal LVEDP   Heart team review, consider medical therapy versus complex atherectomy and intervention of the left circumflex which would involve stenting back into the left main and covering a long segment throughout the circumflex in order to treat multiple severe calcific lesions.  Note that the patient had akinesis of the inferior and posterior walls on his echo and hypokinesis of the anterior wall segments.  He does not have angina.  Might be appropriate to perform a viability study.  Will discuss his case with Dr. [name].  Cardiac MRI 01/19/2022:  1. Severe LV dilatation with severe systolic dysfunction (EF 28%). Thinning/akinesis of inferior and lateral walls.   2.  Mild RV dilatation with mild systolic dysfunction (EF 40%)   3. While no late gadolinium enhancement is seen, there is severe thinning (less than 4.2mm) in the apical to basal lateral wall and apical inferior wall, suggesting nonviability in these regions.   4.  Moderate mitral regurgitation (regurgitant fraction 27%)   5.  Moderate tricuspid regurgitation (regurgitant fraction 25%)  Patient Profile     77 y.o. male with recurrent persistent atrial fibrillation, planned for ablation 01/19/2023, status post cardioversion 10/16/2022 with early recurrence of atrial fibrillation, on chronic anticoagulation with Eliquis, admitted status post fall complicated by subdural hematoma.  Additional problems include well compensated systolic heart failure due to severe ischemic cardiomyopathy with  LVEF 25-30%, CAD s/p CABG (with known occlusion of native coronary arteries as well as vein grafts to diagonal and PLA, patent LIMA to LAD), moderate mitral regurgitation, hypertension, hyperlipidemia, OSA on BiPAP.  Assessment & Plan    Clinically he appears euvolemic.  NYHA functional class II (symptoms are even better when he is in normal sinus rhythm). Anticoagulation has been reversed due to subdural hematoma.  Cancel plans for cardioversion.  Will reschedule for cardioversion after he has been back on uninterrupted anticoagulation for minimum of 3 weeks.  Plan for ablation in July of this year. He did not have syncope.  He clearly remembers falling and hitting the floor with his head. Interrogation of his pacemaker shows normal device function and does not show any ventricular arrhythmia.  He is not pacemaker dependent.  He has been in persistent atrial fibrillation with well-controlled ventricular rates.     For questions or updates,  please contact [facility] Please consult [email or web address] for contact info under        Signed, Thurmon Fair, MD  11/10/2022, 11:15 AM

## 2022-11-10 NOTE — Evaluation (Signed)
Clinical/Bedside Swallow Evaluation Patient Details  Name: [name] [name] MRN: [id number] Date of Birth: February 27, 1946  Today's Date: 11/10/2022 Time: SLP Start Time (ACUTE ONLY): 1040 SLP Stop Time (ACUTE ONLY): 1050 SLP Time Calculation (min) (ACUTE ONLY): 10 min  Past Medical History:  Past Medical History:  Diagnosis Date   Arthritis    Knee L - probably   Atrial fibrillation (HCC)    BPH (benign prostatic hyperplasia)    CAD (coronary artery disease) 07/06/2011   CHF (congestive heart failure) (HCC)    Coronary artery disease    Diabetes mellitus    Frequency of urination    Heart failure (HCC)    High cholesterol    History of nuclear stress test 09/2010   dipyridamole; mild perfusion defect due to attenuation with mild superimposed ischemia in apical septal, apical, apical inferior & apical lateral regions; rest LV enlarged in size; prominent gut uptake in infero-apical region; no significant ischemia demonstrated; low risk scan    HNP (herniated nucleus pulposus), lumbar    Hypertension    Ischemic cardiomyopathy    LV dysfunction 07/06/2011   Nocturia    Right bundle branch block    S/P CABG (coronary artery bypass graft) 08/03/2011   x3; LIMA to LAD,, SVG to PDA, SVG to posterolateral branch of RCA; Dr. Wayland Salinas   Sleep apnea    sleep study 10/2010- AHI during total sleep 32.1/hr and during REM 62.3/hr (severe sleep apnea)unable to tolerate c pap   Spinal stenosis of lumbar region    Stroke [facility])    [place]    Past Surgical History:  Past Surgical History:  Procedure Laterality Date   ABDOMINAL AORTOGRAM W/LOWER EXTREMITY Left 11/14/2020   Procedure: ABDOMINAL AORTOGRAM W/LOWER EXTREMITY;  Surgeon: Cephus Shelling, MD;  Location: [facility] INVASIVE CV LAB;  Service: Cardiovascular;  Laterality: Left;   ABDOMINAL AORTOGRAM W/LOWER EXTREMITY N/A 01/02/2021   Procedure: ABDOMINAL AORTOGRAM W/LOWER EXTREMITY;  Surgeon: Cephus Shelling, MD;  Location: [facility] INVASIVE CV  LAB;  Service: Cardiovascular;  Laterality: N/A;   AMPUTATION Left 03/20/2021   Procedure: Left foot 5th ray amputation;  Surgeon: Toni Arthurs, MD;  Location: [facility];  Service: Orthopedics;  Laterality: Left;   BIV ICD INSERTION CRT-D N/A 04/30/2022   Procedure: BIV ICD INSERTION CRT-D;  Surgeon: [name], MD;  Location: [facility] INVASIVE CV LAB;  Service: Cardiovascular;  Laterality: N/A;   CARDIAC CATHETERIZATION  01/2011   ischemic cardiomyopathy 30-35%, multivessel CAD (Dr. Bishop Limbo)    CARDIAC CATHETERIZATION  09/13/2015   Procedure: Right/Left Heart Cath and Coronary/Graft Angiography;  Surgeon: [name], MD;  Location: [facility] INVASIVE CV LAB;  Service: Cardiovascular;;   CARDIOVERSION N/A 05/16/2019   Procedure: CARDIOVERSION;  Surgeon: [name], MD;  Location: [facility] ENDOSCOPY;  Service: Cardiovascular;  Laterality: N/A;   CARDIOVERSION N/A 10/16/2022   Procedure: CARDIOVERSION;  Surgeon: Thurmon Fair, MD;  Location: [facility] INVASIVE CV LAB;  Service: Cardiovascular;  Laterality: N/A;   Colonscopy     CORONARY ARTERY BYPASS GRAFT  08/03/2011   Procedure: CORONARY ARTERY BYPASS GRAFTING (CABG);  Surgeon: Alleen Borne, MD;  Location: [facility] OR;  Service: Open Heart Surgery;  Laterality: N/A;  CABG times three using right saphenous vein and left mammary artery usisng endoscope.   LEFT HEART CATH AND CORS/GRAFTS ANGIOGRAPHY N/A 12/15/2021   Procedure: LEFT HEART CATH AND CORS/GRAFTS ANGIOGRAPHY;  Surgeon: Tonny Bollman, MD;  Location: [facility] INVASIVE CV LAB;  Service: Cardiovascular;  Laterality: N/A;  LEFT HEART CATHETERIZATION WITH CORONARY/GRAFT ANGIOGRAM N/A 09/20/2013   Procedure: LEFT HEART CATHETERIZATION WITH Isabel Caprice;  Surgeon: [name], MD;  Location: [facility] CATH LAB;  Service: Cardiovascular;  Laterality: N/A;   Lower Extremity Arterial Doppler  03/16/2013   bilat ABIs demonstated normal values; R runoff - posterior tibial & anterior tibial arteries occluded; L  runoff - peroneal & posterior tibial arteries occluded, anterior tibial artery appears occluded   LUMBAR LAMINECTOMY/DECOMPRESSION MICRODISCECTOMY N/A 09/12/2014   Procedure: LUMBAR DECOMPRESSION L3-L4, L4-L5, MICRODISCECTOMY L4-L5;  Surgeon: Jene Every, MD;  Location: [facility] ORS;  Service: Orthopedics;  Laterality: N/A;   PERIPHERAL VASCULAR BALLOON ANGIOPLASTY  11/14/2020   Procedure: PERIPHERAL VASCULAR BALLOON ANGIOPLASTY;  Surgeon: Cephus Shelling, MD;  Location: [facility] INVASIVE CV LAB;  Service: Cardiovascular;;   PERIPHERAL VASCULAR BALLOON ANGIOPLASTY Left 01/02/2021   Procedure: PERIPHERAL VASCULAR BALLOON ANGIOPLASTY;  Surgeon: Cephus Shelling, MD;  Location: [facility] INVASIVE CV LAB;  Service: Cardiovascular;  Laterality: Left;  Failed PTA   Pilonidal Cyst removed     TRANSTHORACIC ECHOCARDIOGRAM  11/10/2012   EF 40-45%, mild LVH, mild hypokinesis of anteroseptal myocardium, grade 1 diastolic dysfunction; mild MR & calcifed mitral annulus; LA mild-mod dilated; RA mildly dilated   HPI:  Patient is a 77 y.o. male with PMH: CAD, CHF, DM, a-fib, HTN, stroke ([place]), sleep apnea. He presented to the ED on 11/09/22 following fall previous night; he was climbing a stair and fell backwards, landing on his back and striking back of his head. He had no LOC or lightheadedness/dizziness, no focal weakness or numbness before or after. He was found to have a 4mm SDH along the anterior falx.    Assessment / Plan / Recommendation  Clinical Impression  Patient is not currently presenting with clinical s/s of dysphagia as per this bedside swallow evaluation. He denies any history of or current difficulty swallowing, denies h/o GERD/esophageal dysphagia. SLP assessed his swallow via multiple, successive cup sips of thin liquids (water). Swallow initiation was timely, no overt s/s aspiration or penetration. SLP not recommending further skilled assessment or intervention at this time. SLP Visit Diagnosis:  Dysphagia, unspecified (R13.10)    Aspiration Risk  No limitations    Diet Recommendation Regular;Thin liquid   Liquid Administration via: Cup;Straw Medication Administration: Whole meds with liquid Supervision: Patient able to self feed Postural Changes: Seated upright at 90 degrees    Other  Recommendations Oral Care Recommendations: Oral care BID;Patient independent with oral care    Recommendations for follow up therapy are one component of a multi-disciplinary discharge planning process, led by the attending physician.  Recommendations may be updated based on patient status, additional functional criteria and insurance authorization.  Follow up Recommendations No SLP follow up      Assistance Recommended at Discharge    Functional Status Assessment Patient has not had a recent decline in their functional status  Frequency and Duration   N/A         Prognosis   N/A     Swallow Study   General Date of Onset: 11/09/22 HPI: Patient is a 77 y.o. male with PMH: CAD, CHF, DM, a-fib, HTN, stroke ([place]), sleep apnea. He presented to the ED on 11/09/22 following fall previous night; he was climbing a stair and fell backwards, landing on his back and striking back of his head. He had no LOC or lightheadedness/dizziness, no focal weakness or numbness before or after. He was found to have a 4mm SDH along the anterior  falx. Type of Study: Bedside Swallow Evaluation Previous Swallow Assessment: none found Diet Prior to this Study: Regular;Thin liquids (Level 0) Temperature Spikes Noted: No Respiratory Status: Room air History of Recent Intubation: No Behavior/Cognition: Alert;Cooperative;Pleasant mood Oral Cavity Assessment: Within Functional Limits Oral Care Completed by SLP: No Oral Cavity - Dentition: Adequate natural dentition Self-Feeding Abilities: Able to feed self Patient Positioning: Upright in chair Baseline Vocal Quality: Normal Volitional Cough:  Strong Volitional Swallow: Able to elicit    Oral/Motor/Sensory Function Overall Oral Motor/Sensory Function: Within functional limits   Ice Chips     Thin Liquid Thin Liquid: Within functional limits Presentation: Straw;Self Fed    Nectar Thick     Honey Thick     Puree Puree: Not tested   Solid     Solid: Not tested      Angela Nevin, MA, CCC-SLP Speech Therapy

## 2022-11-10 NOTE — Evaluation (Signed)
Physical Therapy Evaluation Patient Details Name: [name] [name] MRN: [id number] DOB: 07-11-1945 Today's Date: 11/10/2022  History of Present Illness  Mr. Colliver is a 77 year old male admitted 5/13 with mechanical fall resulting in subdural hematoma 4mm without any mas effect. Eliquis was held and reversal antibiotic was administered. Neurosurgery recommending reversal and overnight observation with CT in the morning. PMH significant for afib (on eliquis), DM, CAD, HLD, ischemic cardiomyopathy, CVA, arthritis, CHF and sleep apnea.  Clinical Impression  Pt tolerated today's session well but is presenting below his mobility baseline. Pt reports independence with all mobility at baseline, admitted after a fall at home, reporting this is the only fall he has had. Pt noted to have decreased strength and sensation on LLE, but reports this is at baseline. Pt ambulating in the room and toileting with minG for safety, no overt LOB noted but pt reaching out for UE support, declining use of RW or AD. Pt reported feeling faint after having a BM, BP noted to be 96/51 and pt requesting a seated rest break, improving to 126/60 when seated. Acute PT will continue to follow up with pt to progress mobility during admission, recommend continuing OPPT upon discharge as pt reports he was attending prior to admission.        Recommendations for follow up therapy are one component of a multi-disciplinary discharge planning process, led by the attending physician.  Recommendations may be updated based on patient status, additional functional criteria and insurance authorization.  Follow Up Recommendations       Assistance Recommended at Discharge Intermittent Supervision/Assistance  Patient can return home with the following  A little help with walking and/or transfers;Help with stairs or ramp for entrance;Assist for transportation    Equipment Recommendations None recommended by PT  Recommendations for Other  Services       Functional Status Assessment Patient has had a recent decline in their functional status and demonstrates the ability to make significant improvements in function in a reasonable and predictable amount of time.     Precautions / Restrictions Precautions Precautions: Fall Precaution Comments: SDH Restrictions Weight Bearing Restrictions: No      Mobility  Bed Mobility               General bed mobility comments: pt in chair upon arrival and ended session in chair    Transfers Overall transfer level: Needs assistance Equipment used: None Transfers: Sit to/from Stand Sit to Stand: Min guard           General transfer comment: minG for safety and balance with line management    Ambulation/Gait Ambulation/Gait assistance: Min guard Gait Distance (Feet): 20 Feet (x2 trials) Assistive device: None Gait Pattern/deviations: Step-through pattern, Trunk flexed, Drifts right/left Gait velocity: mildly decreased     General Gait Details: noted to intermittently reach for BUE support with ambulation, declining use of RW, mild imbalance noted. Pt reported feeling faint after toileting, BP found to be 96/51 and further ambulation deferred at this time as pt requesting seated rest break  Stairs            Wheelchair Mobility    Modified Rankin (Stroke Patients Only)       Balance Overall balance assessment: Mild deficits observed, not formally tested  Pertinent Vitals/Pain Pain Assessment Pain Assessment: No/denies pain    Home Living Family/patient expects to be discharged to:: Private residence Living Arrangements: Alone Available Help at Discharge: Family;Available PRN/intermittently Type of Home: House Home Access: Stairs to enter Entrance Stairs-Rails: Right Entrance Stairs-Number of Steps: 2   Home Layout: One level Home Equipment: Grab bars - tub/shower;Cane - single  Librarian, academic (2 wheels) Additional Comments: pt reports his sister and brother in-law live close by, reports his significant other can assist him as needed    Prior Function Prior Level of Function : Independent/Modified Independent;Driving             Mobility Comments: denies other falls besides the one causing admission, pt was driving, independent with all mobility       Hand Dominance   Dominant Hand: Right    Extremity/Trunk Assessment   Upper Extremity Assessment Upper Extremity Assessment: Defer to OT evaluation    Lower Extremity Assessment Lower Extremity Assessment: LLE deficits/detail LLE Deficits / Details: diminished light touch in L compared to R, strength grossly 4/5 throughout which pt reports is baseline LLE Sensation: decreased light touch    Cervical / Trunk Assessment Cervical / Trunk Assessment: Normal  Communication   Communication: No difficulties  Cognition Arousal/Alertness: Awake/alert Behavior During Therapy: WFL for tasks assessed/performed Overall Cognitive Status: Within Functional Limits for tasks assessed                                 General Comments: A&Ox4        General Comments General comments (skin integrity, edema, etc.): after toileting, pt reports feeling faint, BP found to be 96/51, improving to 126/60 with a seated rest break, 123/50 with standing    Exercises     Assessment/Plan    PT Assessment Patient needs continued PT services  PT Problem List Decreased strength;Decreased activity tolerance;Decreased mobility;Decreased balance;Decreased knowledge of use of DME;Decreased safety awareness;Decreased knowledge of precautions;Impaired sensation       PT Treatment Interventions DME instruction;Gait training;Stair training;Functional mobility training;Therapeutic exercise;Therapeutic activities;Balance training;Neuromuscular re-education;Patient/family education    PT Goals (Current goals can  be found in the Care Plan section)  Acute Rehab PT Goals Patient Stated Goal: go home PT Goal Formulation: With patient Time For Goal Achievement: 11/24/22 Potential to Achieve Goals: Good    Frequency Min 3X/week     Co-evaluation PT/OT/SLP Co-Evaluation/Treatment: Yes Reason for Co-Treatment: To address functional/ADL transfers;For patient/therapist safety PT goals addressed during session: Mobility/safety with mobility;Balance;Proper use of DME OT goals addressed during session: ADL's and self-care       AM-PAC PT "6 Clicks" Mobility  Outcome Measure Help needed turning from your back to your side while in a flat bed without using bedrails?: None Help needed moving from lying on your back to sitting on the side of a flat bed without using bedrails?: A Little Help needed moving to and from a bed to a chair (including a wheelchair)?: A Little Help needed standing up from a chair using your arms (e.g., wheelchair or bedside chair)?: A Little Help needed to walk in hospital room?: A Little Help needed climbing 3-5 steps with a railing? : A Little 6 Click Score: 19    End of Session Equipment Utilized During Treatment: Gait belt Activity Tolerance: Patient tolerated treatment well Patient left: in chair;with call bell/phone within reach;with family/visitor present;with chair alarm set Nurse Communication: Mobility status PT Visit Diagnosis: Difficulty in  walking, not elsewhere classified (R26.2);History of falling (Z91.81);Muscle weakness (generalized) (M62.81)    Time: 1610-9604 PT Time Calculation (min) (ACUTE ONLY): 23 min   Charges:   PT Evaluation $PT Eval Low Complexity: 1 Low          Lindalou Hose, PT DPT Acute Rehabilitation Services Office [phone]   Leonie Man 11/10/2022, 3:28 PM

## 2022-11-10 NOTE — Progress Notes (Addendum)
PROGRESS NOTE                                                                                                                                                                                                             Patient Demographics:    [name] [name], is a 77 y.o. male, DOB - 05-08-1946, UEA:[id number]  Outpatient Primary MD for the patient is [name], [name], MD    LOS - 0  Admit date - 11/09/2022    Chief Complaint  Patient presents with   Fall       Brief Narrative (HPI from H&P)   77 y.o. male with medical history of AFib on eliquis, CAD s/p CABG,  HFrEF, RBBB s/p PPM/ICD, T2DM, HLD who presented to the ED today after falling last night. Around 8:30pm he was climbing a stair and next thing he knew he was falling backward and landed on his back striking the back of his head. He had no LOC or lightheadedness/dizziness, no preceding chest pain or palpitations.  He thinks he tripped on his garage stairs but he does not recall the event very clearly.  He presented to [facility], ER where he was found to have a 4 mm subdural hematoma, neurosurgery was consulted over the phone who requested that patient be transferred to [facility] for more close monitoring.   Subjective:    [name] [name] today has, No headache, No chest pain, No abdominal pain - No Nausea, No new weakness tingling or numbness, no SOB, chronic left leg weakness.   Assessment  & Plan :    Traumatic subdural hematoma: 4 mm subdural hematoma on CT scan after a mechanical fall in a patient with Eliquis for A-fib. -He is currently symptom-free, chronic left lower extremity weakness, repeat head CT today, case discussed with neurosurgeon Dr. Jillyn Hidden cram on 11/10/2022, if repeat head CT is stable then can cautiously resume Eliquis in 7 days time (11/18/22).  Advance activity, PT OT and monitor.  Note he received ANDEXXA for Eliquis reversal in the ER.    Persistent  atrial fibrillation: Has had multiple cardioversions, confirmed to be in AFib here with slow ventricular response. s/p PPM/ICD.  [place] vas 2 score of greater than 3. - Continue amiodarone 200mg  daily - Continue coreg low dose - Appears to have ablation scheduled 01/19/2023 with Dr. [name].,  Eliquis to be resumed if repeat CT stable in 7 days. -  Last echo specifically makes note of no intracardiac thrombus.  His defibrillator was interrogated Case discussed with Dr. Royann Shivers, no dysrhythmia.   Chronic HFrEF, ICM, CAD s/p CABG 2013, HLD: Last echo June 2023 w/LVEF 30-35%, RWMA, mod septal LVH, normal RV, severe MR, mild-mod TR. - Continue home lasix 40mg  BID, coreg. Has allergy to entresto listed. Holding jardiance with mild AKI.  Overall compensated. - Continue rosuvastatin   Mild AKI: SCr 1.3 from baseline ~1.0.  - Treat supportively, allow diet for rehydration. Appears to have edema, so will continue his regular lasix. Ok to hold SGLT2i for right now, renal function is improving continue to monitor.   Normocytic anemia: Near baseline.  - Monitor in AM.   Scalp abrasion left posterior scalp with hemostatic abrasion not requiring staples.  - Keep covered.  - Tylenol prn   T2DM:  - Hold metformin, SSI for now.  Lab Results  Component Value Date   HGBA1C 7.0 (H) 10/17/2020   CBG (last 3)  Recent Labs    11/09/22 1737 11/09/22 2135 11/10/22 0852  GLUCAP 185* 200* 131*        Condition - Extremely Guarded  Family Communication  :  family bedside 11/10/22  Code Status :  Full  Consults  :  N. Surg Dr Wynetta Emery over the phone, Cards  PUD Prophylaxis :    Procedures  :     CT 11/09/22 - 1. Acute subdural hemorrhage along the anterior falx measuring 4 mm in thickness. No mass effect.      Disposition Plan  :    Status is: Observation   DVT Prophylaxis  :    SCDs Start: 11/09/22 1618    Lab Results  Component Value Date   PLT 213 11/10/2022    Diet :  Diet Order              Diet heart healthy/carb modified Room service appropriate? Yes with Assist; Fluid consistency: Thin  Diet effective now                    Inpatient Medications  Scheduled Meds:  amiodarone  200 mg Oral Daily   carvedilol  3.125 mg Oral BID WC   furosemide  40 mg Oral BID   gabapentin  600 mg Oral QHS   insulin aspart  0-5 Units Subcutaneous QHS   insulin aspart  0-9 Units Subcutaneous TID WC   rosuvastatin  10 mg Oral QODAY   sodium chloride flush  3 mL Intravenous Q12H   Continuous Infusions: PRN Meds:.acetaminophen  Antibiotics  :    Anti-infectives (From admission, onward)    None         Objective:   Vitals:   11/10/22 0200 11/10/22 0400 11/10/22 0600 11/10/22 0800  BP:  (!) 129/59  134/67  Pulse: (!) 50 (!) 50 (!) 53 (!) 54  Resp: 17 18 17 16   Temp:  97.8 F (36.6 C)  98.8 F (37.1 C)  TempSrc:  Oral  Oral  SpO2: 98% 99% 99% 93%  Weight:      Height:        Wt Readings from Last 3 Encounters:  11/09/22 97.5 kg  10/27/22 98.4 kg  10/22/22 99.3 kg     Intake/Output Summary (Last 24 hours) at 11/10/2022 1124 Last data filed at 11/09/2022 1649 Gross per 24 hour  Intake 90 ml  Output --  Net  90 ml     Physical Exam  Awake Alert, No new F.N deficits, chronic left lower extremity weakness [place].AT,PERRAL Supple Neck, No JVD,   Symmetrical Chest wall movement, Good air movement bilaterally, CTAB RRR,No Gallops,Rubs or new Murmurs,  +ve B.Sounds, Abd Soft, No tenderness,   No Cyanosis, Clubbing or edema      Data Review:    Recent Labs  Lab 11/09/22 1159 11/10/22 0600  WBC 7.5 6.8  HGB 11.6* 11.5*  HCT 35.1* 35.1*  PLT 232 213  MCV 83.8 83.6  MCH 27.7 27.4  MCHC 33.0 32.8  RDW 15.4 15.3  LYMPHSABS 1.0  --   MONOABS 0.6  --   EOSABS 0.4  --   BASOSABS 0.1  --     Recent Labs  Lab 11/09/22 1159 11/10/22 0600  NA 136 139  K 3.4* 3.8  CL 99 101  CO2 28 25  ANIONGAP 9 13  GLUCOSE 283* 146*  BUN 26* 21   CREATININE 1.39* 1.25*  INR 1.8* 1.5*  TSH  --  1.624  BNP  --  723.8*  MG  --  1.9  CALCIUM 8.5* 8.4*      Recent Labs  Lab 11/09/22 1159 11/10/22 0600  INR 1.8* 1.5*  TSH  --  1.624  BNP  --  723.8*  MG  --  1.9  CALCIUM 8.5* 8.4*      Micro Results No results found for this or any previous visit (from the past 240 hour(s)).  Radiology Reports CT Head Wo Contrast  Result Date: 11/09/2022 CLINICAL DATA:  Blunt poly trauma. EXAM: CT HEAD WITHOUT CONTRAST TECHNIQUE: Contiguous axial images were obtained from the base of the skull through the vertex without intravenous contrast. RADIATION DOSE REDUCTION: This exam was performed according to the departmental dose-optimization program which includes automated exposure control, adjustment of the mA and/or kV according to patient size and/or use of iterative reconstruction technique. COMPARISON:  12/19/2017 FINDINGS: Brain: No evidence of acute infarction, hydrocephalus, or mass. Acute subdural hemorrhage along the anterior falx measuring 4 mm in thickness. Left cerebellar encephalomalacia. Generalized cerebral atrophy. Left subinsular lacunar infarct. Vascular: No hyperdense vessel. Intracranial atherosclerotic disease. Skull: No osseous abnormality. Sinuses/Orbits: Visualized paranasal sinuses are clear. Visualized mastoid sinuses are clear. Visualized orbits demonstrate no focal abnormality. Other: None IMPRESSION: 1. Acute subdural hemorrhage along the anterior falx measuring 4 mm in thickness. No mass effect. Critical Value/emergent results were called by telephone at the time of interpretation on 11/09/2022 at 12:52 pm to provider JULIE HAVILAND , who verbally acknowledged these results. Electronically Signed   By: Elige Ko M.D.   On: 11/09/2022 12:53   CT Cervical Spine Wo Contrast  Result Date: 11/09/2022 CLINICAL DATA:  Neck trauma (Age >= 65y) EXAM: CT CERVICAL SPINE WITHOUT CONTRAST TECHNIQUE: Multidetector CT imaging of the  cervical spine was performed without intravenous contrast. Multiplanar CT image reconstructions were also generated. RADIATION DOSE REDUCTION: This exam was performed according to the departmental dose-optimization program which includes automated exposure control, adjustment of the mA and/or kV according to patient size and/or use of iterative reconstruction technique. COMPARISON:  None Available. FINDINGS: Alignment: Facet joints are aligned without dislocation or traumatic listhesis. Dens and lateral masses are aligned. Skull base and vertebrae: No acute fracture. No primary bone lesion or focal pathologic process. The right C3-4 facet joint is fused. Soft tissues and spinal canal: No prevertebral fluid or swelling. No visible canal hematoma. 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 Disc levels: Relatively mild disc height loss. There is multilevel facet and uncovertebral arthropathy. Upper chest: Negative. Other: Bilateral carotid atherosclerosis. IMPRESSION: 1. No acute fracture or traumatic listhesis of the cervical spine. 2. Mild-moderate multilevel degenerative changes of the cervical spine. Electronically Signed   By: Duanne Guess D.O.   On: 11/09/2022 12:49   DG HIP UNILAT WITH PELVIS 2-3 VIEWS LEFT  Result Date: 11/09/2022 CLINICAL DATA:  Fall with left hip pain. EXAM: DG HIP (WITH OR WITHOUT PELVIS) 2-3V LEFT COMPARISON:  None Available. 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 No acute fracture or dislocation. Degenerative changes of the spine and sacroiliac joints. IMPRESSION: 1. No acute findings. 2. 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 Electronically Signed   By: [name] M.D.   On: 11/09/2022 12:47      Signature  -   Susa Raring M.D on 11/10/2022 at 11:24 AM   -  To page go to [email or web address]

## 2022-11-10 NOTE — Plan of Care (Signed)

## 2022-11-10 NOTE — Care Management Obs Status (Signed)
MEDICARE OBSERVATION STATUS NOTIFICATION   Patient Details  Name: [name] [name] MRN: [id number] Date of Birth: March 12, 1946   Medicare Observation Status Notification Given:  Yes    Lawerance Sabal, RN 11/10/2022, 3:38 PM

## 2022-11-11 ENCOUNTER — Ambulatory Visit (HOSPITAL_BASED_OUTPATIENT_CLINIC_OR_DEPARTMENT_OTHER): Payer: Medicare Other

## 2022-11-11 ENCOUNTER — Telehealth: Payer: Self-pay

## 2022-11-11 ENCOUNTER — Ambulatory Visit: Payer: Medicare Other | Admitting: General Practice

## 2022-11-11 DIAGNOSIS — I5022 Chronic systolic (congestive) heart failure: Secondary | ICD-10-CM | POA: Diagnosis not present

## 2022-11-11 DIAGNOSIS — S065XAA Traumatic subdural hemorrhage with loss of consciousness status unknown, initial encounter: Secondary | ICD-10-CM | POA: Diagnosis not present

## 2022-11-11 DIAGNOSIS — S065X0A Traumatic subdural hemorrhage without loss of consciousness, initial encounter: Secondary | ICD-10-CM | POA: Diagnosis not present

## 2022-11-11 DIAGNOSIS — E119 Type 2 diabetes mellitus without complications: Secondary | ICD-10-CM | POA: Diagnosis not present

## 2022-11-11 DIAGNOSIS — I4811 Longstanding persistent atrial fibrillation: Secondary | ICD-10-CM | POA: Diagnosis not present

## 2022-11-11 LAB — BASIC METABOLIC PANEL
Anion gap: 10 (ref 5–15)
BUN: 20 mg/dL (ref 8–23)
CO2: 24 mmol/L (ref 22–32)
Calcium: 8.3 mg/dL — ABNORMAL LOW (ref 8.9–10.3)
Chloride: 100 mmol/L (ref 98–111)
Creatinine, Ser: 1.18 mg/dL (ref 0.61–1.24)
GFR, Estimated: >60 mL/min (ref >60)
Glucose, Bld: 149 mg/dL — ABNORMAL HIGH (ref 70–99)
Potassium: 3.6 mmol/L (ref 3.5–5.1)
Sodium: 134 mmol/L — ABNORMAL LOW (ref 135–145)

## 2022-11-11 LAB — CBC WITH DIFFERENTIAL/PLATELET
Abs Immature Granulocytes: 0.03 10*3/uL (ref 0.00–0.07)
Basophils Absolute: 0.1 10*3/uL (ref 0.0–0.1)
Basophils Relative: 1 %
Eosinophils Absolute: 0.8 10*3/uL — ABNORMAL HIGH (ref 0.0–0.5)
Eosinophils Relative: 11 %
HCT: 35.6 % — ABNORMAL LOW (ref 39.0–52.0)
Hemoglobin: 11.8 g/dL — ABNORMAL LOW (ref 13.0–17.0)
Immature Granulocytes: 0 %
Lymphocytes Relative: 18 %
Lymphs Abs: 1.4 10*3/uL (ref 0.7–4.0)
MCH: 27 pg (ref 26.0–34.0)
MCHC: 33.1 g/dL (ref 30.0–36.0)
MCV: 81.5 fL (ref 80.0–100.0)
Monocytes Absolute: 0.7 10*3/uL (ref 0.1–1.0)
Monocytes Relative: 9 %
Neutro Abs: 4.6 10*3/uL (ref 1.7–7.7)
Neutrophils Relative %: 61 %
Platelets: 221 10*3/uL (ref 150–400)
RBC: 4.37 MIL/uL (ref 4.22–5.81)
RDW: 15.2 % (ref 11.5–15.5)
WBC: 7.5 10*3/uL (ref 4.0–10.5)
nRBC: 0 % (ref 0.0–0.2)

## 2022-11-11 LAB — BRAIN NATRIURETIC PEPTIDE: B Natriuretic Peptide: 413.9 pg/mL — ABNORMAL HIGH (ref 0.0–100.0)

## 2022-11-11 LAB — GLUCOSE, CAPILLARY: Glucose-Capillary: 169 mg/dL — ABNORMAL HIGH (ref 70–99)

## 2022-11-11 LAB — MAGNESIUM: Magnesium: 2 mg/dL (ref 1.7–2.4)

## 2022-11-11 NOTE — Progress Notes (Signed)
Occupational Therapy Treatment Patient Details Name: [name] [name] MRN: [id number] DOB: 10-18-1945 Today's Date: 11/11/2022   History of present illness Mr. Fiers is a 77 year old male admitted 5/13 with mechanical fall resulting in subdural hematoma 4mm without any mas effect. Eliquis was held and reversal antibiotic was administered. Neurosurgery recommending reversal and overnight observation with CT in the morning. PMH significant for afib (on eliquis), DM, CAD, HLD, ischemic cardiomyopathy, CVA, arthritis, CHF and sleep apnea.   OT comments  Pt supine in bed with HOB elevated with wife present in room upon OT arrival. Pt agreeable to participation in skilled OT session. OT instructed pt in and provided pt with handouts related to techniques for recognizing and managing symptoms of postural hypotension and fall prevention strategies. Pt verbalized understanding of all education and demonstrated understanding through teach back. Pt will benefit from reinforcement of all training. Pt currently demonstrates the ability to complete bed mobility with Mod I, functional transfers/mobility with Min guard assist, UB ADLs Independent in sitting, and LB ADLs with Supervision to Min guard assist. Pt is making good progress toward goals. Acute OT to continue to follow. Discharge plan remains appropriate.    Recommendations for follow up therapy are one component of a multi-disciplinary discharge planning process, led by the attending physician.  Recommendations may be updated based on patient status, additional functional criteria and insurance authorization.    Assistance Recommended at Discharge Intermittent Supervision/Assistance  Patient can return home with the following  A little help with walking and/or transfers;A little help with bathing/dressing/bathroom;Assistance with cooking/housework;Assist for transportation;Help with stairs or ramp for entrance   Equipment Recommendations        Recommendations for Other Services      Precautions / Restrictions Precautions Precautions: Fall Precaution Comments: SDH Restrictions Weight Bearing Restrictions: No       Mobility Bed Mobility Overal bed mobility: Modified Independent             General bed mobility comments: Requires extra time    Transfers Overall transfer level: Modified independent Equipment used: None Transfers: Sit to/from Stand Sit to Stand: Min guard           General transfer comment: minG for safety and balance with line management     Balance Overall balance assessment: Mild deficits observed, not formally tested                                         ADL either performed or assessed with clinical judgement   ADL                                         General ADL Comments: OT instructed pt in techniques for recognizing and managing symptoms of postural hypotention to increase safety and independence with ADLs, functional transfers, and funcitonal mobility. OT also instructed pt in fall prevention strategies in the home to increase safety and independence with ADLs, and functional transfers/mobility. Pt verbalized understanding and demonstrated understanding through teach back.    Extremity/Trunk Assessment              Vision       Perception     Praxis      Cognition Arousal/Alertness: Awake/alert Behavior During Therapy: WFL for tasks assessed/performed Overall Cognitive Status: Within Functional Limits for  tasks assessed                                 General Comments: A&Ox4        Exercises      Shoulder Instructions       General Comments VSS on RA throughout session. Orthos taken with BP 131/62 in sitting and 117/57 in standing. Pt reported no dizziness or lightheadedness this session. Pt's wife present throughout session.    Pertinent Vitals/ Pain       Pain Assessment Pain Assessment: No/denies  pain  Home Living                                          Prior Functioning/Environment              Frequency           Progress Toward Goals  OT Goals(current goals can now be found in the care plan section)  Progress towards OT goals: Progressing toward goals  Acute Rehab OT Goals Patient Stated Goal: To return home  Plan Discharge plan remains appropriate    Co-evaluation          OT goals addressed during session: ADL's and self-care      AM-PAC OT "6 Clicks" Daily Activity     Outcome Measure   Help from another person eating meals?: None Help from another person taking care of personal grooming?: A Little Help from another person toileting, which includes using toliet, bedpan, or urinal?: A Little Help from another person bathing (including washing, rinsing, drying)?: A Little Help from another person to put on and taking off regular upper body clothing?: A Little Help from another person to put on and taking off regular lower body clothing?: A Little 6 Click Score: 19    End of Session    OT Visit Diagnosis: Unsteadiness on feet (R26.81);Muscle weakness (generalized) (M62.81);History of falling (Z91.81)   Activity Tolerance     Patient Left     Nurse Communication          Time: 4098-1191 OT Time Calculation (min): 39 min  Charges: OT General Charges $OT Visit: 1 Visit OT Treatments $Self Care/Home Management : 38-52 mins  [address] Mayflower StreetOrson Eva., OTR/L, MA Acute Rehab [phone] }  Lendon Colonel 11/11/2022, 12:42 PM

## 2022-11-11 NOTE — Discharge Summary (Addendum)
PATIENT DETAILS Name: [name] [name] Age: 77 y.o. Sex: male Date of Birth: February 07, 1946 MRN: [id number]. Admitting Physician: Tyrone Nine, MD WUJ:[id number], [name], MD  Admit Date: 11/09/2022 Discharge date: 11/11/2022  Recommendations for Outpatient Follow-up:  Follow up with PCP in 1-2 weeks Please obtain CMP/CBC in one week Ensure follow-up with cardiology If remains stable-resume Eliquis 5/22.  Admitted From:  Home  Disposition: Home   Discharge Condition: good  CODE STATUS:   Code Status: Full Code   Diet recommendation:  Diet Order             Diet - low sodium heart healthy           Diet general           Diet heart healthy/carb modified Room service appropriate? Yes with Assist; Fluid consistency: Thin  Diet effective now                    Brief Summary: 77 year old with history of A-fib on Eliquis, CAD s/p CABG, HFrEF-s/p ICD/PPM implantation who presented following a mechanical fall and was found to have a small subdural hematoma.  He was subsequently admitted to the hospitalist office-underwent repeat CT scan of the head on 5/14 which showed stability of the hematoma-Case was discussed with neurosurgery-with plans to resume Eliquis on 5/22 if he remains stable.  See below for further details.   Brief Hospital Course: Subdural hematoma in the setting of mechanical fall Traumatic SDH-stable on repeat CT head Prior MD Dr. Thedore Mins discussed with neurosurgeon-Dr. Wynetta Emery on 5/14-resume Eliquis 7 days time on- 5/22. Long discussion with patient/family members at bedside-regarding above plans-all understand.  Persistent atrial fibrillation Continue amiodarone/Coreg Eliquis on hold-resume 5/22-if he continues to be stable Cardiology arrange for outpatient follow-up-outpatient cardioversion will be rescheduled-he will continue to follow-up with EP for potential ablation July 2024  Chronic HFrEF CAD s/p CABG 2013 Compensated/euvolemic Resume  Lasix/Coreg/Jardiance/statin Apparently is allergic to [facility]  AKI Resolved Likely hemodynamically mediated  Normocytic anemia Mild-stable for outpatient follow-up  DM-2 CBG stable on SSI Resume metformin/Glu presented on discharge  BMI: Estimated body mass index is 27.6 kg/m as calculated from the following:   Height as of this encounter: 6\' 2"  (1.88 m).   Weight as of this encounter: 97.5 kg.    Discharge Diagnoses:  Principal Problem:   Subdural hematoma (HCC) Active Problems:   DM2 (diabetes mellitus, type 2) (HCC)   Coronary artery disease involving native coronary artery of native heart without angina pectoris   Chronic systolic heart failure (HCC)   Atrial fibrillation [facility])   Discharge Instructions:  Activity:  As tolerated  Discharge Instructions     (HEART FAILURE PATIENTS) Call MD:  Anytime you have any of the following symptoms: 1) 3 pound weight gain in 24 hours or 5 pounds in 1 week 2) shortness of breath, with or without a dry hacking cough 3) swelling in the hands, feet or stomach 4) if you have to sleep on extra pillows at night in order to breathe.   Complete by: As directed    Diet - low sodium heart healthy   Complete by: As directed    Diet general   Complete by: As directed    Discharge instructions   Complete by: As directed    Follow with Primary MD  [name], [name], MD in 1-2 weeks  Follow-up with cardiology-the office will call you with a follow-up appointment  Continue to hold Eliquis until 5/22-resume  if you continue to feel better.  If in the unlikely event you have worsening headache, nausea, vomiting, unsteady gait-please seek immediate medical attention.  Please get a complete blood count and chemistry panel checked by your Primary MD at your next visit, and again as instructed by your Primary MD.  Get Medicines reviewed and adjusted: Please take all your medications with you for your next visit with your Primary  MD  Laboratory/radiological data: Please request your Primary MD to go over all hospital tests and procedure/radiological results at the follow up, please ask your Primary MD to get all Hospital records sent to his/her office.  In some cases, they will be blood work, cultures and biopsy results pending at the time of your discharge. Please request that your primary care M.D. follows up on these results.  Also Note the following: If you experience worsening of your admission symptoms, develop shortness of breath, life threatening emergency, suicidal or homicidal thoughts you must seek medical attention immediately by calling 911 or calling your MD immediately  if symptoms less severe.  You must read complete instructions/literature along with all the possible adverse reactions/side effects for all the Medicines you take and that have been prescribed to you. Take any new Medicines after you have completely understood and accpet all the possible adverse reactions/side effects.   Do not drive when taking Pain medications or sleeping medications (Benzodaizepines)  Do not take more than prescribed Pain, Sleep and Anxiety Medications. It is not advisable to combine anxiety,sleep and pain medications without talking with your primary care practitioner  Special Instructions: If you have smoked or chewed Tobacco  in the last 2 yrs please stop smoking, stop any regular Alcohol  and or any Recreational drug use.  Wear Seat belts while driving.  Please note: You were cared for by a hospitalist during your hospital stay. Once you are discharged, your primary care physician will handle any further medical issues. Please note that NO REFILLS for any discharge medications will be authorized once you are discharged, as it is imperative that you return to your primary care physician (or establish a relationship with a primary care physician if you do not have one) for your post hospital discharge needs so that  they can reassess your need for medications and monitor your lab values.   Increase activity slowly   Complete by: As directed       Allergies as of 11/11/2022       Reactions   Entresto [sacubitril-valsartan] Other (See Comments)   dizziness   Sacubitril Other (See Comments)    Dizziness        Medication List     STOP taking these medications    aspirin EC 81 MG tablet   Eliquis 5 MG Tabs tablet Generic drug: apixaban       TAKE these medications    acetaminophen 500 MG tablet Commonly known as: TYLENOL Take 500-1,000 mg by mouth every 6 (six) hours as needed (for pain.).   amiodarone 200 MG tablet Commonly known as: PACERONE Take 2 tablets (400 mg total) TWICE a day for 2 weeks, then take 1 tablet (200 mg total) TWICE a day for 2 weeks, then take 1 tablet (200 mg total) ONCE a day thereafter What changed:  how much to take how to take this when to take this additional instructions   carvedilol 3.125 MG tablet Commonly known as: COREG Take 1 tablet (3.125 mg total) by mouth 2 (two) times daily with a  meal.   empagliflozin 25 MG Tabs tablet Commonly known as: Jardiance Take 1 tablet (25 mg total) by mouth daily.   furosemide 40 MG tablet Commonly known as: LASIX Take 1 1/2 tablet by mouth in the morning and 1 tablet by mouth in the afternoon for 4 days then go back down to 1 tablet by mouth twice a day What changed:  how much to take how to take this when to take this   gabapentin 300 MG capsule Commonly known as: NEURONTIN Take 600 mg by mouth at bedtime.   glipiZIDE 10 MG 24 hr tablet Commonly known as: GLUCOTROL XL Take 10 mg by mouth at bedtime.   metFORMIN 500 MG 24 hr tablet Commonly known as: GLUCOPHAGE-XR Take 1,000 mg by mouth 2 (two) times daily.   OneTouch Verio test strip Generic drug: glucose blood 1 each daily.   rosuvastatin 10 MG tablet Commonly known as: CRESTOR Take 1 tablet (10 mg total) by mouth every other day.         Follow-up Information     Eustace Pen, PA-C Follow up.   Specialty: Cardiology Why: Hospital follow-up with the [facility] scheduled for 12/02/2022 at 11:30am. This is currently located on the 6th floor of the [facility]. Contact information: [address] [place] [address] [phone]                Allergies  Allergen Reactions   Sherryll Burger [Sacubitril-Valsartan] Other (See Comments)    dizziness   Sacubitril Other (See Comments)     Dizziness     Other Procedures/Studies: ECHOCARDIOGRAM COMPLETE  Result Date: 11/10/2022    ECHOCARDIOGRAM REPORT   Patient Name:   [name] [name] Date of Exam: 11/10/2022 Medical Rec #:  [id number]      Height:       74.0 in Accession #:    [id number]     Weight:       215.0 lb Date of Birth:  1946-03-11     BSA:          2.241 m Patient Age:    76 years       BP:           134/67 mmHg Patient Gender: M              HR:           49 bpm. Exam Location:  Inpatient Procedure: 2D Echo, Cardiac Doppler and Color Doppler Indications:    A-fib, MV insuff., CMO  History:        Patient has prior history of Echocardiogram examinations, most                 recent 12/05/2021. Cardiomyopathy, Arrythmias:Atrial Fibrillation;                 Risk Factors:Diabetes.  Sonographer:    Lucy Antigua Referring Phys: [id number] VISHNU P MALLIPEDDI IMPRESSIONS  1. Left ventricular ejection fraction, by estimation, is 25 to 30%. The left ventricle has severely decreased function. The left ventricle demonstrates global hypokinesis. The left ventricular internal cavity size was mildly to moderately dilated. Left ventricular diastolic parameters are indeterminate. Elevated left ventricular end-diastolic pressure.  2. Right ventricular systolic function is moderately reduced. The right ventricular size is moderately enlarged.  3. The mitral valve is normal in structure. Moderate mitral valve regurgitation. No evidence of mitral stenosis.  4.  Tricuspid valve regurgitation is mild to moderate.  5. The aortic  valve is tricuspid. Aortic valve regurgitation is not visualized. No aortic stenosis is present.  6. The inferior vena cava is normal in size with greater than 50% respiratory variability, suggesting right atrial pressure of 3 mmHg. FINDINGS  Left Ventricle: Left ventricular ejection fraction, by estimation, is 25 to 30%. The left ventricle has severely decreased function. The left ventricle demonstrates global hypokinesis. The left ventricular internal cavity size was mildly to moderately dilated. There is no left ventricular hypertrophy. Left ventricular diastolic parameters are indeterminate. Elevated left ventricular end-diastolic pressure. Right Ventricle: The right ventricular size is moderately enlarged. No increase in right ventricular wall thickness. Right ventricular systolic function is moderately reduced. Left Atrium: Left atrial size was normal in size. Right Atrium: Right atrial size was normal in size. Pericardium: There is no evidence of pericardial effusion. Mitral Valve: The mitral valve is normal in structure. Moderate mitral valve regurgitation. No evidence of mitral valve stenosis. Tricuspid Valve: The tricuspid valve is normal in structure. Tricuspid valve regurgitation is mild to moderate. No evidence of tricuspid stenosis. Aortic Valve: The aortic valve is tricuspid. Aortic valve regurgitation is not visualized. No aortic stenosis is present. Aortic valve mean gradient measures 3.0 mmHg. Aortic valve peak gradient measures 5.4 mmHg. Aortic valve area, by VTI measures 2.75 cm. Pulmonic Valve: The pulmonic valve was not well visualized. Pulmonic valve regurgitation is trivial. No evidence of pulmonic stenosis. Aorta: The aortic root is normal in size and structure. Venous: The inferior vena cava is normal in size with greater than 50% respiratory variability, suggesting right atrial pressure of 3 mmHg. IAS/Shunts: No atrial  level shunt detected by color flow Doppler. Additional Comments: A device lead is visualized.  LEFT VENTRICLE PLAX 2D LVIDd:         6.10 cm   Diastology LVIDs:         5.50 cm   LV e' medial:    3.37 cm/s LV PW:         1.00 cm   LV E/e' medial:  30.3 LV IVS:        0.70 cm   LV e' lateral:   4.46 cm/s LVOT diam:     2.50 cm   LV E/e' lateral: 22.9 LV SV:         68 LV SV Index:   30 LVOT Area:     4.91 cm  RIGHT VENTRICLE RV S prime:     5.87 cm/s TAPSE (M-mode): 1.1 cm LEFT ATRIUM             Index LA Vol (A2C):   74.0 ml 33.02 ml/m LA Vol (A4C):   52.8 ml 23.56 ml/m LA Biplane Vol: 64.6 ml 28.82 ml/m  AORTIC VALVE AV Area (Vmax):    1.93 cm AV Area (Vmean):   2.71 cm AV Area (VTI):     2.75 cm AV Vmax:           116.00 cm/s AV Vmean:          80.500 cm/s AV VTI:            0.248 m AV Peak Grad:      5.4 mmHg AV Mean Grad:      3.0 mmHg LVOT Vmax:         45.65 cm/s LVOT Vmean:        44.500 cm/s LVOT VTI:          0.139 m LVOT/AV VTI ratio: 0.56  AORTA Ao Root diam: 3.30 cm  Ao Asc diam:  3.40 cm MITRAL VALVE                TRICUSPID VALVE MV Area (PHT): 4.78 cm     TR Peak grad:   25.0 mmHg MR Peak grad: 85.7 mmHg     TR Vmax:        250.00 cm/s MR Vmax:      463.00 cm/s MV E velocity: 102.00 cm/s  SHUNTS                             Systemic VTI:  0.14 m                             Systemic Diam: 2.50 cm Kardie Tobb DO Electronically signed by Thomasene Ripple DO Signature Date/Time: 11/10/2022/4:32:10 PM    Final    CT HEAD WO CONTRAST ( )  Result Date: 11/10/2022 CLINICAL DATA:  77 year old male with para falcine subdural blood on head CT recently after trauma. EXAM: CT HEAD WITHOUT CONTRAST TECHNIQUE: Contiguous axial images were obtained from the base of the skull through the vertex without intravenous contrast. RADIATION DOSE REDUCTION: This exam was performed according to the departmental dose-optimization program which includes automated exposure control, adjustment of the mA and/or kV according  to patient size and/or use of iterative reconstruction technique. COMPARISON:  Head CT 10/10/2022.  MRI 12/20/2017. FINDINGS: Brain: Right para falcine subdural blood remains hyperdense, 3-4 mm in thickness. This has not significantly changed. No associated intracranial mass effect. No peripheral subdural blood or new intracranial hemorrhage is identified. Chronic left cerebellar infarct. Left inferior lentiform probable perivascular space. Stable gray-white matter differentiation throughout the brain. No cortically based acute infarct identified. No ventriculomegaly. Vascular: Calcified atherosclerosis at the skull base. Skull: No acute osseous abnormality identified. Sinuses/Orbits: Visualized paranasal sinuses and mastoids are stable and well aerated. Other: Calcified scalp vessel atherosclerosis. No acute orbit or scalp soft tissue finding. IMPRESSION: 1. Unchanged small volume right para falcine subdural blood. No associated intracranial mass effect. 2. No new intracranial abnormality. Chronic left cerebellar infarct. Electronically Signed   By: Odessa Fleming M.D.   On: 11/10/2022 12:31   CT Head Wo Contrast  Result Date: 11/09/2022 CLINICAL DATA:  Blunt poly trauma. EXAM: CT HEAD WITHOUT CONTRAST TECHNIQUE: Contiguous axial images were obtained from the base of the skull through the vertex without intravenous contrast. RADIATION DOSE REDUCTION: This exam was performed according to the departmental dose-optimization program which includes automated exposure control, adjustment of the mA and/or kV according to patient size and/or use of iterative reconstruction technique. COMPARISON:  12/19/2017 FINDINGS: Brain: No evidence of acute infarction, hydrocephalus, or mass. Acute subdural hemorrhage along the anterior falx measuring 4 mm in thickness. Left cerebellar encephalomalacia. Generalized cerebral atrophy. Left subinsular lacunar infarct. Vascular: No hyperdense vessel. Intracranial atherosclerotic disease.  Skull: No osseous abnormality. Sinuses/Orbits: Visualized paranasal sinuses are clear. Visualized mastoid sinuses are clear. Visualized orbits demonstrate no focal abnormality. Other: None IMPRESSION: 1. Acute subdural hemorrhage along the anterior falx measuring 4 mm in thickness. No mass effect. Critical Value/emergent results were called by telephone at the time of interpretation on 11/09/2022 at 12:52 pm to provider JULIE HAVILAND , who verbally acknowledged these results. Electronically Signed   By: Elige Ko M.D.   On: 11/09/2022 12:53   CT Cervical Spine Wo Contrast  Result Date: 11/09/2022 CLINICAL DATA:  Neck trauma (Age >=  65y) EXAM: CT CERVICAL SPINE WITHOUT CONTRAST TECHNIQUE: Multidetector CT imaging of the cervical spine was performed without intravenous contrast. Multiplanar CT image reconstructions were also generated. RADIATION DOSE REDUCTION: This exam was performed according to the departmental dose-optimization program which includes automated exposure control, adjustment of the mA and/or kV according to patient size and/or use of iterative reconstruction technique. COMPARISON:  None Available. FINDINGS: Alignment: Facet joints are aligned without dislocation or traumatic listhesis. Dens and lateral masses are aligned. Skull base and vertebrae: No acute fracture. No primary bone lesion or focal pathologic process. The right C3-4 facet joint is fused. Soft tissues and spinal canal: No prevertebral fluid or swelling. No visible canal hematoma. 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 Disc levels: Relatively mild disc height loss. There is multilevel facet and uncovertebral arthropathy. Upper chest: Negative. Other: Bilateral carotid atherosclerosis. IMPRESSION: 1. No acute fracture or traumatic listhesis of the cervical spine. 2. Mild-moderate multilevel degenerative changes of the cervical spine. Electronically Signed   By: Duanne Guess D.O.    On: 11/09/2022 12:49   DG HIP UNILAT WITH PELVIS 2-3 VIEWS LEFT  Result Date: 11/09/2022 CLINICAL DATA:  Fall with left hip pain. EXAM: DG HIP (WITH OR WITHOUT PELVIS) 2-3V LEFT COMPARISON:  None Available. 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 No acute fracture or dislocation. Degenerative changes of the spine and sacroiliac joints. IMPRESSION: 1. No acute findings. 2. 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 Electronically Signed   By: [name] M.D.   On: 11/09/2022 12:47   CUP PACEART REMOTE DEVICE CHECK  Result Date: 11/02/2022 Scheduled remote reviewed. Normal device function.  Known AF, on OAC, good ventricular rate control, AF burden is 100% of the time since last evaluation on 10/26/2022 Next remote 91 days. Hassell Halim, RN, CCDS, [employer]  EP STUDY  Result Date: 10/16/2022 See surgical note for result.    TODAY-DAY OF DISCHARGE:  Subjective:   [name] [name] today has no headache,no chest abdominal pain,no new weakness tingling or numbness, feels much better wants to go home today.   Objective:   Blood pressure (!) 121/52, pulse (!) 51, temperature 98 F (36.7 C), temperature source Oral, resp. rate 16, height 6\' 2"  (1.88 m), weight 97.5 kg, SpO2 99 %.  Intake/Output Summary (Last 24 hours) at 11/11/2022 0842 Last data filed at 11/10/2022 1300 Gross per 24 hour  Intake 360 ml  Output --  Net 360 ml   Filed Weights   11/09/22 1109  Weight: 97.5 kg    Exam: Awake Alert, Oriented *3, No new F.N deficits, Normal affect .AT,PERRAL Supple Neck,No JVD, No cervical lymphadenopathy appriciated.  Symmetrical Chest wall movement, Good air movement bilaterally, CTAB RRR,No Gallops,Rubs or new Murmurs, No Parasternal Heave +ve B.Sounds, Abd Soft, Non tender, No organomegaly appriciated, No rebound -guarding or rigidity. No Cyanosis, Clubbing or edema, No new Rash or bruise   PERTINENT RADIOLOGIC STUDIES: ECHOCARDIOGRAM  COMPLETE  Result Date: 11/10/2022    ECHOCARDIOGRAM REPORT   Patient Name:   [name] [name] Date of Exam: 11/10/2022 Medical Rec #:  [id number]      Height:       74.0 in Accession #:    [id number]     Weight:       215.0 lb Date of Birth:  05-16-1946     BSA:          2.241 m Patient Age:    35 years  BP:           134/67 mmHg Patient Gender: M              HR:           49 bpm. Exam Location:  Inpatient Procedure: 2D Echo, Cardiac Doppler and Color Doppler Indications:    A-fib, MV insuff., CMO  History:        Patient has prior history of Echocardiogram examinations, most                 recent 12/05/2021. Cardiomyopathy, Arrythmias:Atrial Fibrillation;                 Risk Factors:Diabetes.  Sonographer:    Lucy Antigua Referring Phys: [id number] VISHNU P MALLIPEDDI IMPRESSIONS  1. Left ventricular ejection fraction, by estimation, is 25 to 30%. The left ventricle has severely decreased function. The left ventricle demonstrates global hypokinesis. The left ventricular internal cavity size was mildly to moderately dilated. Left ventricular diastolic parameters are indeterminate. Elevated left ventricular end-diastolic pressure.  2. Right ventricular systolic function is moderately reduced. The right ventricular size is moderately enlarged.  3. The mitral valve is normal in structure. Moderate mitral valve regurgitation. No evidence of mitral stenosis.  4. Tricuspid valve regurgitation is mild to moderate.  5. The aortic valve is tricuspid. Aortic valve regurgitation is not visualized. No aortic stenosis is present.  6. The inferior vena cava is normal in size with greater than 50% respiratory variability, suggesting right atrial pressure of 3 mmHg. FINDINGS  Left Ventricle: Left ventricular ejection fraction, by estimation, is 25 to 30%. The left ventricle has severely decreased function. The left ventricle demonstrates global hypokinesis. The left ventricular internal cavity size was mildly to moderately  dilated. There is no left ventricular hypertrophy. Left ventricular diastolic parameters are indeterminate. Elevated left ventricular end-diastolic pressure. Right Ventricle: The right ventricular size is moderately enlarged. No increase in right ventricular wall thickness. Right ventricular systolic function is moderately reduced. Left Atrium: Left atrial size was normal in size. Right Atrium: Right atrial size was normal in size. Pericardium: There is no evidence of pericardial effusion. Mitral Valve: The mitral valve is normal in structure. Moderate mitral valve regurgitation. No evidence of mitral valve stenosis. Tricuspid Valve: The tricuspid valve is normal in structure. Tricuspid valve regurgitation is mild to moderate. No evidence of tricuspid stenosis. Aortic Valve: The aortic valve is tricuspid. Aortic valve regurgitation is not visualized. No aortic stenosis is present. Aortic valve mean gradient measures 3.0 mmHg. Aortic valve peak gradient measures 5.4 mmHg. Aortic valve area, by VTI measures 2.75 cm. Pulmonic Valve: The pulmonic valve was not well visualized. Pulmonic valve regurgitation is trivial. No evidence of pulmonic stenosis. Aorta: The aortic root is normal in size and structure. Venous: The inferior vena cava is normal in size with greater than 50% respiratory variability, suggesting right atrial pressure of 3 mmHg. IAS/Shunts: No atrial level shunt detected by color flow Doppler. Additional Comments: A device lead is visualized.  LEFT VENTRICLE PLAX 2D LVIDd:         6.10 cm   Diastology LVIDs:         5.50 cm   LV e' medial:    3.37 cm/s LV PW:         1.00 cm   LV E/e' medial:  30.3 LV IVS:        0.70 cm   LV e' lateral:   4.46 cm/s LVOT diam:  2.50 cm   LV E/e' lateral: 22.9 LV SV:         68 LV SV Index:   30 LVOT Area:     4.91 cm  RIGHT VENTRICLE RV S prime:     5.87 cm/s TAPSE (M-mode): 1.1 cm LEFT ATRIUM             Index LA Vol (A2C):   74.0 ml 33.02 ml/m LA Vol (A4C):   52.8  ml 23.56 ml/m LA Biplane Vol: 64.6 ml 28.82 ml/m  AORTIC VALVE AV Area (Vmax):    1.93 cm AV Area (Vmean):   2.71 cm AV Area (VTI):     2.75 cm AV Vmax:           116.00 cm/s AV Vmean:          80.500 cm/s AV VTI:            0.248 m AV Peak Grad:      5.4 mmHg AV Mean Grad:      3.0 mmHg LVOT Vmax:         45.65 cm/s LVOT Vmean:        44.500 cm/s LVOT VTI:          0.139 m LVOT/AV VTI ratio: 0.56  AORTA Ao Root diam: 3.30 cm Ao Asc diam:  3.40 cm MITRAL VALVE                TRICUSPID VALVE MV Area (PHT): 4.78 cm     TR Peak grad:   25.0 mmHg MR Peak grad: 85.7 mmHg     TR Vmax:        250.00 cm/s MR Vmax:      463.00 cm/s MV E velocity: 102.00 cm/s  SHUNTS                             Systemic VTI:  0.14 m                             Systemic Diam: 2.50 cm Kardie Tobb DO Electronically signed by Thomasene Ripple DO Signature Date/Time: 11/10/2022/4:32:10 PM    Final    CT HEAD WO CONTRAST ( )  Result Date: 11/10/2022 CLINICAL DATA:  77 year old male with para falcine subdural blood on head CT recently after trauma. EXAM: CT HEAD WITHOUT CONTRAST TECHNIQUE: Contiguous axial images were obtained from the base of the skull through the vertex without intravenous contrast. RADIATION DOSE REDUCTION: This exam was performed according to the departmental dose-optimization program which includes automated exposure control, adjustment of the mA and/or kV according to patient size and/or use of iterative reconstruction technique. COMPARISON:  Head CT 10/10/2022.  MRI 12/20/2017. FINDINGS: Brain: Right para falcine subdural blood remains hyperdense, 3-4 mm in thickness. This has not significantly changed. No associated intracranial mass effect. No peripheral subdural blood or new intracranial hemorrhage is identified. Chronic left cerebellar infarct. Left inferior lentiform probable perivascular space. Stable gray-white matter differentiation throughout the brain. No cortically based acute infarct identified. No  ventriculomegaly. Vascular: Calcified atherosclerosis at the skull base. Skull: No acute osseous abnormality identified. Sinuses/Orbits: Visualized paranasal sinuses and mastoids are stable and well aerated. Other: Calcified scalp vessel atherosclerosis. No acute orbit or scalp soft tissue finding. IMPRESSION: 1. Unchanged small volume right para falcine subdural blood. No associated intracranial mass effect. 2. No new intracranial abnormality. Chronic left cerebellar infarct. Electronically Signed  By: Odessa Fleming M.D.   On: 11/10/2022 12:31   CT Head Wo Contrast  Result Date: 11/09/2022 CLINICAL DATA:  Blunt poly trauma. EXAM: CT HEAD WITHOUT CONTRAST TECHNIQUE: Contiguous axial images were obtained from the base of the skull through the vertex without intravenous contrast. RADIATION DOSE REDUCTION: This exam was performed according to the departmental dose-optimization program which includes automated exposure control, adjustment of the mA and/or kV according to patient size and/or use of iterative reconstruction technique. COMPARISON:  12/19/2017 FINDINGS: Brain: No evidence of acute infarction, hydrocephalus, or mass. Acute subdural hemorrhage along the anterior falx measuring 4 mm in thickness. Left cerebellar encephalomalacia. Generalized cerebral atrophy. Left subinsular lacunar infarct. Vascular: No hyperdense vessel. Intracranial atherosclerotic disease. Skull: No osseous abnormality. Sinuses/Orbits: Visualized paranasal sinuses are clear. Visualized mastoid sinuses are clear. Visualized orbits demonstrate no focal abnormality. Other: None IMPRESSION: 1. Acute subdural hemorrhage along the anterior falx measuring 4 mm in thickness. No mass effect. Critical Value/emergent results were called by telephone at the time of interpretation on 11/09/2022 at 12:52 pm to provider JULIE HAVILAND , who verbally acknowledged these results. Electronically Signed   By: Elige Ko M.D.   On: 11/09/2022 12:53   CT  Cervical Spine Wo Contrast  Result Date: 11/09/2022 CLINICAL DATA:  Neck trauma (Age >= 65y) EXAM: CT CERVICAL SPINE WITHOUT CONTRAST TECHNIQUE: Multidetector CT imaging of the cervical spine was performed without intravenous contrast. Multiplanar CT image reconstructions were also generated. RADIATION DOSE REDUCTION: This exam was performed according to the departmental dose-optimization program which includes automated exposure control, adjustment of the mA and/or kV according to patient size and/or use of iterative reconstruction technique. COMPARISON:  None Available. FINDINGS: Alignment: Facet joints are aligned without dislocation or traumatic listhesis. Dens and lateral masses are aligned. Skull base and vertebrae: No acute fracture. No primary bone lesion or focal pathologic process. The right C3-4 facet joint is fused. Soft tissues and spinal canal: No prevertebral fluid or swelling. No visible canal hematoma. 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 Disc levels: Relatively mild disc height loss. There is multilevel facet and uncovertebral arthropathy. Upper chest: Negative. Other: Bilateral carotid atherosclerosis. IMPRESSION: 1. No acute fracture or traumatic listhesis of the cervical spine. 2. Mild-moderate multilevel degenerative changes of the cervical spine. Electronically Signed   By: Duanne Guess D.O.   On: 11/09/2022 12:49   DG HIP UNILAT WITH PELVIS 2-3 VIEWS LEFT  Result Date: 11/09/2022 CLINICAL DATA:  Fall with left hip pain. EXAM: DG HIP (WITH OR WITHOUT PELVIS) 2-3V LEFT COMPARISON:  None Available. 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 No acute fracture or dislocation. Degenerative changes of the spine and sacroiliac joints. IMPRESSION: 1. No acute findings. 2. 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 Electronically Signed   By: [name] M.D.   On: 11/09/2022 12:47     PERTINENT LAB  RESULTS: CBC: Recent Labs    11/10/22 0600 11/11/22 0741  WBC 6.8 7.5  HGB 11.5* 11.8*  HCT 35.1* 35.6*  PLT 213 221   CMET CMP     Component Value Date/Time   NA 134 (L) 11/11/2022 0741   NA 144 10/12/2022 1432   K 3.6 11/11/2022 0741   CL 100 11/11/2022 0741   CO2 24 11/11/2022 0741   GLUCOSE 149 (H) 11/11/2022 0741   BUN 20 11/11/2022 0741   BUN 17 10/12/2022 1432   CREATININE 1.18 11/11/2022 0741   CREATININE 1.07 03/12/2021  1002   CALCIUM 8.3 (L) 11/11/2022 0741   PROT 7.1 11/17/2021 1622   ALBUMIN 4.0 11/17/2021 1622   AST 13 11/17/2021 1622   ALT 13 11/17/2021 1622   ALKPHOS 147 (H) 11/17/2021 1622   BILITOT 1.2 11/17/2021 1622   GFRNONAA >60 11/11/2022 0741   GFRAA 80 01/22/2020 1449    GFR Estimated Creatinine Clearance: 60 mL/min (by C-G formula based on SCr of 1.18 mg/dL). No results for input(s): "LIPASE", "AMYLASE" in the last 72 hours. No results for input(s): "CKTOTAL", "CKMB", "CKMBINDEX", "TROPONINI" in the last 72 hours. Invalid input(s): "POCBNP" No results for input(s): "DDIMER" in the last 72 hours. No results for input(s): "HGBA1C" in the last 72 hours. No results for input(s): "CHOL", "HDL", "LDLCALC", "TRIG", "CHOLHDL", "LDLDIRECT" in the last 72 hours. Recent Labs    11/10/22 0600  TSH 1.624   No results for input(s): "VITAMINB12", "FOLATE", "FERRITIN", "TIBC", "IRON", "RETICCTPCT" in the last 72 hours. Coags: Recent Labs    11/09/22 1159 11/10/22 0600  INR 1.8* 1.5*   Microbiology: No results found for this or any previous visit (from the past 240 hour(s)).  FURTHER DISCHARGE INSTRUCTIONS:  Get Medicines reviewed and adjusted: Please take all your medications with you for your next visit with your Primary MD  Laboratory/radiological data: Please request your Primary MD to go over all hospital tests and procedure/radiological results at the follow up, please ask your Primary MD to get all Hospital records sent to his/her  office.  In some cases, they will be blood work, cultures and biopsy results pending at the time of your discharge. Please request that your primary care M.D. goes through all the records of your hospital data and follows up on these results.  Also Note the following: If you experience worsening of your admission symptoms, develop shortness of breath, life threatening emergency, suicidal or homicidal thoughts you must seek medical attention immediately by calling 911 or calling your MD immediately  if symptoms less severe.  You must read complete instructions/literature along with all the possible adverse reactions/side effects for all the Medicines you take and that have been prescribed to you. Take any new Medicines after you have completely understood and accpet all the possible adverse reactions/side effects.   Do not drive when taking Pain medications or sleeping medications (Benzodaizepines)  Do not take more than prescribed Pain, Sleep and Anxiety Medications. It is not advisable to combine anxiety,sleep and pain medications without talking with your primary care practitioner  Special Instructions: If you have smoked or chewed Tobacco  in the last 2 yrs please stop smoking, stop any regular Alcohol  and or any Recreational drug use.  Wear Seat belts while driving.  Please note: You were cared for by a hospitalist during your hospital stay. Once you are discharged, your primary care physician will handle any further medical issues. Please note that NO REFILLS for any discharge medications will be authorized once you are discharged, as it is imperative that you return to your primary care physician (or establish a relationship with a primary care physician if you do not have one) for your post hospital discharge needs so that they can reassess your need for medications and monitor your lab values.  Total Time spent coordinating discharge including counseling, education and face to face time  equals greater than 30 minutes.  SignedJeoffrey Massed 11/11/2022 8:42 AM

## 2022-11-11 NOTE — TOC Transition Note (Signed)
Transition of Care [facility]) - CM/SW Discharge Note   Patient Details  Name: [name] [name] MRN: [id number] Date of Birth: [birth year]  Transition of [facility] CM/SW Contact:  Lawerance Sabal, RN Phone Number: 11/11/2022, 9:09 AM   Clinical Narrative:      Spoke w patient at the bedside. He states that he recently was active with OP PT and would like to resume at that office. It was not a [facility] office and he could not remember the name of it, but referral was made at his request through his PCP. He is agreeable to call his PCP and get another referral.  No other TOC needs identified at this time        Patient Goals and CMS Choice      Discharge Placement                         Discharge Plan and Services Additional resources added to the After Visit Summary for                                       Social Determinants of Health (SDOH) Interventions SDOH Screenings   Food Insecurity: No Food Insecurity (11/09/2022)  Housing: Low Risk  (11/09/2022)  Transportation Needs: No Transportation Needs (11/09/2022)  Utilities: Not At Risk (11/09/2022)  Depression (PHQ2-9): Low Risk  (05/01/2021)  Tobacco Use: Medium Risk (11/09/2022)     Readmission Risk Interventions     No data to display

## 2022-11-11 NOTE — Progress Notes (Signed)
Physical Therapy Treatment Patient Details Name: [name] [name] MRN: [id number] DOB: [birth year] Today's Date: 11/11/2022   History of Present Illness [name] [name] is a 77 year old male admitted 5/13 with mechanical fall resulting in subdural hematoma 4mm without any mas effect. Eliquis was held and reversal antibiotic was administered. Neurosurgery recommending reversal and overnight observation with CT in the morning. PMH significant for afib (on eliquis), DM, CAD, HLD, ischemic cardiomyopathy, CVA, arthritis, CHF and sleep apnea.    PT Comments    Pt tolerated today's session well, able to perform stair trial before return home, minG provided for safety but pt managing well. Pt continues to have mild imbalance with ambulation in the hallway, declining any use of an AD but reaching out for UE support in his room. Pt reports feeling comfortable with mobility at discharge, planning to D/C today, educated on easing back into activity and resting after changing positions to make sure he doesn't get dizzy. Pt was attending OPPT prior to admission to progress his balance, recommend continuing upon discharge. Acute PT will continue to follow during admission.     Recommendations for follow up therapy are one component of a multi-disciplinary discharge planning process, led by the attending physician.  Recommendations may be updated based on patient status, additional functional criteria and insurance authorization.  Follow Up Recommendations       Assistance Recommended at Discharge Intermittent Supervision/Assistance  Patient can return home with the following A little help with walking and/or transfers;Help with stairs or ramp for entrance;Assist for transportation   Equipment Recommendations  None recommended by PT    Recommendations for Other Services       Precautions / Restrictions Precautions Precautions: Fall Precaution Comments: SDH Restrictions Weight Bearing Restrictions: No      Mobility  Bed Mobility Overal bed mobility: Modified Independent             General bed mobility comments: mildly increased time    Transfers Overall transfer level: Needs assistance Equipment used: None Transfers: Sit to/from Stand Sit to Stand: Min guard           General transfer comment: minG for safety with increased time to obtain full upright position, placing hands on his knees to assist    Ambulation/Gait Ambulation/Gait assistance: Min guard Gait Distance (Feet): 250 Feet Assistive device: None Gait Pattern/deviations: Step-through pattern, Trunk flexed, Drifts right/left, Antalgic Gait velocity: decreased     General Gait Details: mild path deviation, reaching for UE support ambulating in the room but not in the hallways, declining use of AD. Pt reports he feels imbalanced but denies dizziness, no overt LOB   Stairs Stairs: Yes Stairs assistance: Min guard Stair Management: One rail Right, Step to pattern, Forwards Number of Stairs: 2 General stair comments: minG for safety and balance but pt managing well   Wheelchair Mobility    Modified Rankin (Stroke Patients Only)       Balance Overall balance assessment: Needs assistance Sitting-balance support: No upper extremity supported, Feet supported Sitting balance-Leahy Scale: Good     Standing balance support: No upper extremity supported, During functional activity Standing balance-Leahy Scale: Fair                              Cognition Arousal/Alertness: Awake/alert Behavior During Therapy: WFL for tasks assessed/performed Overall Cognitive Status: Within Functional Limits for tasks assessed  Exercises      General Comments General comments (skin integrity, edema, etc.): BP in sitting 140/61, in standing 124/50, after ambulation 101/52, pt denying dizziness throughout      Pertinent Vitals/Pain Pain  Assessment Pain Assessment: No/denies pain    Home Living                          Prior Function            PT Goals (current goals can now be found in the care plan section) Acute Rehab PT Goals Patient Stated Goal: go home PT Goal Formulation: With patient Time For Goal Achievement: 11/24/22 Potential to Achieve Goals: Good Progress towards PT goals: Progressing toward goals    Frequency    Min 3X/week      PT Plan Current plan remains appropriate    Co-evaluation       OT goals addressed during session: ADL's and self-care      AM-PAC PT "6 Clicks" Mobility   Outcome Measure  Help needed turning from your back to your side while in a flat bed without using bedrails?: None Help needed moving from lying on your back to sitting on the side of a flat bed without using bedrails?: None Help needed moving to and from a bed to a chair (including a wheelchair)?: A Little Help needed standing up from a chair using your arms (e.g., wheelchair or bedside chair)?: A Little Help needed to walk in hospital room?: A Little Help needed climbing 3-5 steps with a railing? : A Little 6 Click Score: 20    End of Session Equipment Utilized During Treatment: Gait belt Activity Tolerance: Patient tolerated treatment well Patient left: in bed;with call bell/phone within reach;with family/visitor present Nurse Communication: Mobility status PT Visit Diagnosis: Difficulty in walking, not elsewhere classified (R26.2);History of falling (Z91.81);Muscle weakness (generalized) (M62.81)     Time: 2130-8657 PT Time Calculation (min) (ACUTE ONLY): 14 min  Charges:  $Gait Training: 8-22 mins                     Lindalou Hose, PT DPT Acute Rehabilitation Services Office [phone]    Leonie Man 11/11/2022, 12:56 PM

## 2022-11-11 NOTE — Telephone Encounter (Signed)
-----   Message from Karie Soda, RN sent at 11/11/2022  2:18 PM EDT ----- Regarding: Care alert [facility] Care Alert today, 5/15.  Looks like Camnitz addressed AT/AF on 4/30 and started on Amiodarone.  He is also scheduled for ablation in July.  Would you please review to make sure there is nothing else that needs to be done.    Thanks! Jacki Cones

## 2022-11-11 NOTE — Telephone Encounter (Signed)
Flagging for Dr. Marjean Donna for awareness regarding Eliquis hold in light of upcoming AF ablation procedure.   Nothing further to do in regards to device transmission.  Patient scheduled for AF ablation in July.   Just FYI. Patient fell 2 days ago and suffered a subdural hematoma.  Released from hospital today with the following plan regarding his Eliquis:   Persistent atrial fibrillation Continue amiodarone/Coreg Eliquis on hold-resume 5/22-if he continues to be stable Cardiology arrange for outpatient follow-up-outpatient cardioversion will be rescheduled-he will continue to follow-up with EP for potential ablation July 2024

## 2022-11-14 ENCOUNTER — Emergency Department (HOSPITAL_COMMUNITY)
Admission: EM | Admit: 2022-11-14 | Discharge: 2022-11-14 | Disposition: A | Discharge status: Home / Self Care | Payer: Medicare Other | Attending: Emergency Medicine | Admitting: Emergency Medicine

## 2022-11-14 ENCOUNTER — Other Ambulatory Visit: Payer: Self-pay

## 2022-11-14 ENCOUNTER — Encounter (HOSPITAL_COMMUNITY): Payer: Self-pay

## 2022-11-14 ENCOUNTER — Emergency Department (HOSPITAL_COMMUNITY): Payer: Medicare Other

## 2022-11-14 DIAGNOSIS — S0003XA Contusion of scalp, initial encounter: Secondary | ICD-10-CM | POA: Diagnosis not present

## 2022-11-14 DIAGNOSIS — S065XAA Traumatic subdural hemorrhage with loss of consciousness status unknown, initial encounter: Secondary | ICD-10-CM

## 2022-11-14 DIAGNOSIS — S0101XA Laceration without foreign body of scalp, initial encounter: Secondary | ICD-10-CM | POA: Diagnosis not present

## 2022-11-14 DIAGNOSIS — W19XXXA Unspecified fall, initial encounter: Secondary | ICD-10-CM | POA: Diagnosis not present

## 2022-11-14 DIAGNOSIS — S065X0D Traumatic subdural hemorrhage without loss of consciousness, subsequent encounter: Secondary | ICD-10-CM | POA: Insufficient documentation

## 2022-11-14 DIAGNOSIS — W01198A Fall on same level from slipping, tripping and stumbling with subsequent striking against other object, initial encounter: Secondary | ICD-10-CM | POA: Insufficient documentation

## 2022-11-14 DIAGNOSIS — R739 Hyperglycemia, unspecified: Secondary | ICD-10-CM | POA: Diagnosis not present

## 2022-11-14 DIAGNOSIS — M545 Low back pain, unspecified: Secondary | ICD-10-CM | POA: Insufficient documentation

## 2022-11-14 DIAGNOSIS — S0990XA Unspecified injury of head, initial encounter: Secondary | ICD-10-CM | POA: Diagnosis not present

## 2022-11-14 DIAGNOSIS — S3992XA Unspecified injury of lower back, initial encounter: Secondary | ICD-10-CM | POA: Diagnosis not present

## 2022-11-14 DIAGNOSIS — S065X0A Traumatic subdural hemorrhage without loss of consciousness, initial encounter: Secondary | ICD-10-CM | POA: Diagnosis not present

## 2022-11-14 MED ORDER — ACETAMINOPHEN 325 MG PO TABS
650.0000 mg | ORAL_TABLET | Freq: Once | ORAL | Status: AC
  Administered 2022-11-14: 650 mg via ORAL
  Filled 2022-11-14: qty 2

## 2022-11-14 MED ORDER — LIDOCAINE-EPINEPHRINE (PF) 2 %-1:200000 IJ SOLN
20.0000 mL | Freq: Once | INTRAMUSCULAR | Status: AC
  Administered 2022-11-14: 20 mL
  Filled 2022-11-14: qty 20

## 2022-11-14 NOTE — ED Triage Notes (Signed)
Per [facility] pt tripped over a rug and he fell backwards hitting the back of his head, denies LOC. Pt arrived with head wrapped by EMS, bleeding controlled at this time. Pt had a fall and was diagnosed with subdural hematoma 5/13.

## 2022-11-14 NOTE — Discharge Instructions (Addendum)
You were seen in the emergency department for evaluation of head injury after a fall.  You had staples placed to the laceration and these will need to be removed in 7 to 10 days.  Please use ice to the affected area and you may shower and use soap and water.  Tylenol as needed for pain.  Follow-up with your regular doctor.  Return to the emergency department if any worsening or concerning symptoms.  You are not to start taking the Eliquis for 1 week from today.  Please let your cardiology team know that you were seen for another head injury and that the recommendations from the neurosurgeon was not starting the Eliquis until 25 May.  ER for worsening vomiting headache numbness weakness or any other worsening symptoms.

## 2022-11-14 NOTE — ED Provider Notes (Signed)
I have personally seen and examined this patient after change of shift.  He arrives after having a second fall over the last week and a half, he had a laceration which was repaired by Dr. Charm Barges, a CT scan was reviewed and interpreted by radiology and was shared with neurosurgery, I spoke with Beckie Salts covering for the neurosurgery service who has reviewed the imaging and recommends that no further imaging nor admission is required.  She recommends 1 more week of staying off of the anticoagulant.  This was discussed at length with the family member and the patient and will be documented clearly in the discharge paperwork.  The patient was supposed to follow-up for cardioversion but at this time we will probably have to delay that given the injury.  The patient's neurologic exam for me is unremarkable, he moves all 4 extremities with normal strength, cranial nerves III through XII are normal, pupillary exam is normal, normal sensation diffusely, level of alertness is normal, vital signs are unremarkable.  At this time the patient is stable for discharge and both the patient and his daughter are in total agreement.  He will have someone at the house with him for the next few days to make sure that nothing else is wrong.  I feel comfortable letting the patient go home with neurosurgical recommendations.  Final diagnoses:  Fall, initial encounter  Injury of head, initial encounter  Laceration of scalp, initial encounter      Eber Hong, MD 11/14/22 [phone]

## 2022-11-14 NOTE — ED Notes (Signed)
Wound to head cleansed, pt tolerated well

## 2022-11-14 NOTE — ED Provider Notes (Signed)
EMERGENCY DEPARTMENT AT [facility] Provider Note   CSN: [id number] Arrival date & time: 11/14/22  1408     History  Chief Complaint  Patient presents with   Head Injury    [name] [name] is a 77 y.o. male.  He is here for evaluation of fall head injury.  He said he was walking with his family and slipped on a rug fell back hit the back of his head.  No loss of consciousness.  Complaining of some mild head pain and a little bit of low back pain.  No loss consciousness no numbness or weakness.  No bowel or bladder incontinence.  He was recently in the hospital last week for a fall in which he sustained a subdural hematoma that was treated nonoperatively.  He has not started back on blood thinners yet.  The history is provided by the patient and the EMS personnel.  Head Injury Location:  Occipital Time since incident:  1 hour Mechanism of injury: fall   Fall:    Fall occurred:  Walking   Point of impact:  Head Pain details:    Quality:  Throbbing   Timing:  Constant   Progression:  Unchanged Relieved by:  None tried Worsened by:  Nothing Ineffective treatments:  None tried Associated symptoms: no blurred vision, no difficulty breathing, no focal weakness, no loss of consciousness, no nausea, no neck pain and no vomiting        Home Medications Prior to Admission medications   Medication Sig Start Date End Date Taking? Authorizing Provider  acetaminophen (TYLENOL) 500 MG tablet Take 500-1,000 mg by mouth every 6 (six) hours as needed (for pain.).    [provider]  amiodarone (PACERONE) 200 MG tablet Take 2 tablets (400 mg total) TWICE a day for 2 weeks, then take 1 tablet (200 mg total) TWICE a day for 2 weeks, then take 1 tablet (200 mg total) ONCE a day thereafter Patient taking differently: Take 200 mg by mouth daily. 10/27/22   Camnitz, Andree Coss, MD  carvedilol (COREG) 3.125 MG tablet Take 1 tablet (3.125 mg total) by mouth 2 (two) times  daily with a meal. 08/20/22   [name], Lisette Abu, MD  empagliflozin (JARDIANCE) 25 MG TABS tablet Take 1 tablet (25 mg total) by mouth daily. 08/20/22   [name], Lisette Abu, MD  furosemide (LASIX) 40 MG tablet Take 1 1/2 tablet by mouth in the morning and 1 tablet by mouth in the afternoon for 4 days then go back down to 1 tablet by mouth twice a day Patient taking differently: Take 40 mg by mouth 2 (two) times daily. Take 1 1/2 tablet by mouth in the morning and 1 tablet by mouth in the afternoon for 4 days then go back down to 1 tablet by mouth twice a day 10/22/22   Marcelino Duster, PA  gabapentin (NEURONTIN) 300 MG capsule Take 600 mg by mouth at bedtime.    [provider]  glipiZIDE (GLUCOTROL XL) 10 MG 24 hr tablet Take 10 mg by mouth at bedtime.    [provider]  metFORMIN (GLUCOPHAGE-XR) 500 MG 24 hr tablet Take 1,000 mg by mouth 2 (two) times daily. 07/05/15   [provider]  [facility] VERIO test strip 1 each daily. 08/16/19   [provider]  rosuvastatin (CRESTOR) 10 MG tablet Take 1 tablet (10 mg total) by mouth every other day. 10/22/22   Marcelino Duster, PA  Allergies    Entresto [sacubitril-valsartan] and Sacubitril    Review of Systems   Review of Systems  Constitutional:  Negative for fever.  Eyes:  Negative for blurred vision and visual disturbance.  Respiratory:  Negative for shortness of breath.   Cardiovascular:  Negative for chest pain.  Gastrointestinal:  Negative for nausea and vomiting.  Musculoskeletal:  Positive for back pain. Negative for neck pain.  Skin:  Positive for wound.  Neurological:  Negative for focal weakness and loss of consciousness.    Physical Exam Updated Vital Signs BP 131/60   Pulse (!) 56   Temp 98.1 F (36.7 C) (Oral)   Resp 17   Ht 6\' 2"  (1.88 m)   Wt 97.5 kg   SpO2 98%   BMI 27.60 kg/m  Physical Exam Vitals and nursing note reviewed.  Constitutional:      General: He is not in acute  distress.    Appearance: Normal appearance. He is well-developed.  HENT:     Head: Normocephalic.     Comments: 4 cm scalp lack occiput Eyes:     Conjunctiva/sclera: Conjunctivae normal.  Cardiovascular:     Rate and Rhythm: Normal rate and regular rhythm.     Heart sounds: No murmur heard. Pulmonary:     Effort: Pulmonary effort is normal. No respiratory distress.     Breath sounds: Normal breath sounds.  Abdominal:     Palpations: Abdomen is soft.     Tenderness: There is no abdominal tenderness.  Musculoskeletal:        General: No deformity. Normal range of motion.     Cervical back: Neck supple.  Skin:    General: Skin is warm and dry.     Capillary Refill: Capillary refill takes less than 2 seconds.  Neurological:     General: No focal deficit present.     Mental Status: He is alert and oriented to person, place, and time.     Cranial Nerves: No cranial nerve deficit.     Sensory: No sensory deficit.     Motor: No weakness.     Gait: Gait normal.     ED Results / Procedures / Treatments   Labs (all labs ordered are listed, but only abnormal results are displayed) Labs Reviewed - No data to display  EKG None  Radiology CT Head Wo Contrast  Result Date: 11/14/2022 CLINICAL DATA:  Head trauma, minor (Age >= 65y). Known subdural hematoma EXAM: CT HEAD WITHOUT CONTRAST TECHNIQUE: Contiguous axial images were obtained from the base of the skull through the vertex without intravenous contrast. RADIATION DOSE REDUCTION: This exam was performed according to the departmental dose-optimization program which includes automated exposure control, adjustment of the mA and/or kV according to patient size and/or use of iterative reconstruction technique. COMPARISON:  11/10/2022 FINDINGS: Brain: Known right parafalcine subdural hematoma has overall decreased in size compared to prior. Focally measures up to 3-4 mm in thickness in the right frontal region. No associated mass effect or  midline shift. Small amount of layering subdural hematoma along the right tentorium appears more conspicuous on today's exam. No intraparenchymal or intraventricular blood products. Left cerebellar encephalomalacia. No evidence of acute large territory infarction. Vascular: Atherosclerotic calcifications involving the large vessels of the skull base. No unexpected hyperdense vessel. Skull: Normal. Negative for fracture or focal lesion. Sinuses/Orbits: No acute finding. Other: Posterior left parietooccipital scalp hematoma with overlying skin staples. IMPRESSION: 1. Known right parafalcine subdural hematoma has overall decreased in size compared to  prior. Focally measures up to 3-4 mm in thickness in the right frontal region. No associated mass effect or midline shift. 2. Small amount of layering subdural hematoma along the right tentorium appears more conspicuous on today's exam. 3. Posterior left parietooccipital scalp hematoma with overlying skin staples. No underlying calvarial fracture. Electronically Signed   By: Duanne Guess D.O.   On: 11/14/2022 15:20    Procedures .Marland KitchenLaceration Repair  Date/Time: 11/14/2022 2:47 PM  Performed by: Terrilee Files, MD Authorized by: Terrilee Files, MD   Consent:    Consent obtained:  Verbal   Consent given by:  Patient   Risks, benefits, and alternatives were discussed: yes     Risks discussed:  Infection, nerve damage, poor wound healing, pain, retained foreign body, tendon damage and vascular damage   Alternatives discussed:  No treatment, delayed treatment and referral Universal protocol:    Procedure explained and questions answered to patient or proxy's satisfaction: yes     Patient identity confirmed:  Verbally with patient Anesthesia:    Anesthesia method:  Local infiltration   Local anesthetic:  Lidocaine 2% WITH epi Laceration details:    Location:  Scalp   Scalp location:  Occipital   Length (cm):  4 Pre-procedure details:     Preparation:  Patient was prepped and draped in usual sterile fashion Treatment:    Area cleansed with:  Saline   Amount of cleaning:  Standard   Irrigation solution:  Sterile saline Skin repair:    Repair method:  Staples   Number of staples:  4 Approximation:    Approximation:  Close Repair type:    Repair type:  Simple Post-procedure details:    Dressing:  Open (no dressing)   Procedure completion:  Tolerated well, no immediate complications     Medications Ordered in ED Medications  lidocaine-EPINEPHrine (XYLOCAINE W/EPI) 2 %-1:200000 (PF) injection 20 mL (has no administration in time range)    ED Course/ Medical Decision Making/ A&P                             Medical Decision Making Amount and/or Complexity of Data Reviewed Radiology: ordered.  Risk OTC drugs. Prescription drug management.   This patient complains of fall scalp laceration; this involves an extensive number of treatment Options and is a complaint that carries with it a high risk of complications and morbidity. The differential includes head injury, concussion, bleed, subdural, fracture, laceration I ordered medication oral Tylenol and reviewed PMP when indicated. I ordered imaging studies which included head CT and lumbar CT and I independently    visualized and interpreted imaging which showed prior subdural visualized slightly improved.  Lumbar spine CT pending at time of signout Additional history obtained from patient's daughter Previous records obtained and reviewed in epic including prior neurosurgery notes and discharge summary I consulted neurosurgery and discussed lab and imaging findings and discussed disposition.  Cardiac monitoring reviewed, sinus bradycardia Social determinants considered, no significant barriers Critical Interventions: None  After the interventions stated above, I reevaluated the patient and found patient to be awake alert in no distress Admission and further  testing considered, his care is signed out to Dr. Hyacinth Meeker to follow-up on neurosurgery recommendations.         Final Clinical Impression(s) / ED Diagnoses Final diagnoses:  Fall, initial encounter  Injury of head, initial encounter  Laceration of scalp, initial encounter  SDH (subdural hematoma) (HCC)  Rx / DC Orders ED Discharge Orders     None         Terrilee Files, MD 11/14/22 1701

## 2022-11-16 ENCOUNTER — Ambulatory Visit: Payer: Medicare Other | Attending: Cardiology

## 2022-11-16 ENCOUNTER — Ambulatory Visit (HOSPITAL_COMMUNITY): Admission: RE | Admit: 2022-11-16 | Payer: Medicare Other | Source: Home / Self Care | Admitting: Cardiology

## 2022-11-16 ENCOUNTER — Encounter (HOSPITAL_COMMUNITY): Admission: RE | Payer: Self-pay | Source: Home / Self Care

## 2022-11-16 DIAGNOSIS — Z95810 Presence of automatic (implantable) cardiac defibrillator: Secondary | ICD-10-CM | POA: Diagnosis not present

## 2022-11-16 DIAGNOSIS — I480 Paroxysmal atrial fibrillation: Secondary | ICD-10-CM

## 2022-11-16 DIAGNOSIS — E1122 Type 2 diabetes mellitus with diabetic chronic kidney disease: Secondary | ICD-10-CM | POA: Diagnosis not present

## 2022-11-16 DIAGNOSIS — I5022 Chronic systolic (congestive) heart failure: Secondary | ICD-10-CM | POA: Diagnosis not present

## 2022-11-16 DIAGNOSIS — I482 Chronic atrial fibrillation, unspecified: Secondary | ICD-10-CM | POA: Diagnosis not present

## 2022-11-16 SURGERY — CARDIOVERSION
Anesthesia: Monitor Anesthesia Care

## 2022-11-17 NOTE — Progress Notes (Signed)
EPIC Encounter for ICM Monitoring  Patient Name: [name] [name] is a 77 y.o. male Date: 11/17/2022 Primary Care Physican: [name], MD Primary Cardiologist: [facility]        Electrophysiologist: [name] Bi-V Pacing: 92.8%         09/21/2022 Weight: 213 lbs 10/06/2022 Weight: 219 lbs  11/17/2022 Weight: 212.5 lbs   Clinical Status Since 11-Nov-2022 Time in AT/AF 24.0 hr/day (100.0%) Longest AT/AF 27 days                                                            Spoke with patient and heart failure questions reviewed.  Transmission results reviewed.  Pt asymptomatic for fluid accumulation.   2 ED visits for falls and one resulting in subdural hematoma.   His body feels very sore from the falls and referred to to a neurologist.   Optivol thoracic impedance normal but was suggesting possible fluid accumulation from 3/18 to 3/23 (returned to normal after Furosemide was doubled for a day).    Prescribed:  Furosemide 40 mg take 1 tablet(s) (40 mg total) by mouth every morning and at bedtime.   Labs: 10/02/2022 Creatinine 1.13, BUN 19, Potassium 4.3, Sodium 141, GFR 67  09/07/2022 Creatinine 0.98, BUN 20, Potassium 4.2, Sodium 144, GFR 79  A complete set of results can be found in Results Review.   Recommendations:  No changes and encouraged to call if experiencing any fluid symptoms.   Follow-up plan: ICM clinic phone appointment on 12/21/2022.   91 day device clinic remote transmission 02/01/2023.     EP/Cardiology Office Visits: 02/26/2023 with Dr. [name].     Copy of ICM check sent to Dr. [name].   3 month ICM trend: 11/16/2022.    12-14 Month ICM trend:     Karie Soda, RN 11/17/2022 3:57 PM

## 2022-11-23 DIAGNOSIS — S0101XA Laceration without foreign body of scalp, initial encounter: Secondary | ICD-10-CM | POA: Diagnosis not present

## 2022-11-23 DIAGNOSIS — H6121 Impacted cerumen, right ear: Secondary | ICD-10-CM | POA: Diagnosis not present

## 2022-11-23 DIAGNOSIS — I482 Chronic atrial fibrillation, unspecified: Secondary | ICD-10-CM | POA: Diagnosis not present

## 2022-11-23 DIAGNOSIS — R030 Elevated blood-pressure reading, without diagnosis of hypertension: Secondary | ICD-10-CM | POA: Diagnosis not present

## 2022-11-23 DIAGNOSIS — Z6827 Body mass index (BMI) 27.0-27.9, adult: Secondary | ICD-10-CM | POA: Diagnosis not present

## 2022-11-23 DIAGNOSIS — H6122 Impacted cerumen, left ear: Secondary | ICD-10-CM | POA: Diagnosis not present

## 2022-11-30 DIAGNOSIS — M6281 Muscle weakness (generalized): Secondary | ICD-10-CM | POA: Diagnosis not present

## 2022-11-30 DIAGNOSIS — R2681 Unsteadiness on feet: Secondary | ICD-10-CM | POA: Diagnosis not present

## 2022-11-30 DIAGNOSIS — R262 Difficulty in walking, not elsewhere classified: Secondary | ICD-10-CM | POA: Diagnosis not present

## 2022-11-30 DIAGNOSIS — S065X0S Traumatic subdural hemorrhage without loss of consciousness, sequela: Secondary | ICD-10-CM | POA: Diagnosis not present

## 2022-12-01 DIAGNOSIS — R634 Abnormal weight loss: Secondary | ICD-10-CM | POA: Diagnosis not present

## 2022-12-01 DIAGNOSIS — Z6826 Body mass index (BMI) 26.0-26.9, adult: Secondary | ICD-10-CM | POA: Diagnosis not present

## 2022-12-01 DIAGNOSIS — R030 Elevated blood-pressure reading, without diagnosis of hypertension: Secondary | ICD-10-CM | POA: Diagnosis not present

## 2022-12-01 DIAGNOSIS — L309 Dermatitis, unspecified: Secondary | ICD-10-CM | POA: Diagnosis not present

## 2022-12-02 ENCOUNTER — Other Ambulatory Visit: Payer: Self-pay

## 2022-12-02 ENCOUNTER — Ambulatory Visit (HOSPITAL_COMMUNITY)
Admit: 2022-12-02 | Discharge: 2022-12-02 | Disposition: A | Discharge status: Home / Self Care | Payer: Medicare Other | Attending: Internal Medicine | Admitting: Internal Medicine

## 2022-12-02 ENCOUNTER — Encounter (HOSPITAL_COMMUNITY): Payer: Self-pay | Admitting: Internal Medicine

## 2022-12-02 VITALS — BP 120/54 | HR 52 | Ht 74.0 in | Wt 207.8 lb

## 2022-12-02 DIAGNOSIS — D6869 Other thrombophilia: Secondary | ICD-10-CM | POA: Diagnosis not present

## 2022-12-02 DIAGNOSIS — I4819 Other persistent atrial fibrillation: Secondary | ICD-10-CM | POA: Diagnosis not present

## 2022-12-02 DIAGNOSIS — I4811 Longstanding persistent atrial fibrillation: Secondary | ICD-10-CM

## 2022-12-02 DIAGNOSIS — I480 Paroxysmal atrial fibrillation: Secondary | ICD-10-CM

## 2022-12-02 LAB — CBC
HCT: 36 % — ABNORMAL LOW (ref 39.0–52.0)
Hemoglobin: 11.4 g/dL — ABNORMAL LOW (ref 13.0–17.0)
MCH: 26.8 pg (ref 26.0–34.0)
MCHC: 31.7 g/dL (ref 30.0–36.0)
MCV: 84.5 fL (ref 80.0–100.0)
Platelets: 274 10*3/uL (ref 150–400)
RBC: 4.26 MIL/uL (ref 4.22–5.81)
RDW: 15.8 % — ABNORMAL HIGH (ref 11.5–15.5)
WBC: 8.7 10*3/uL (ref 4.0–10.5)
nRBC: 0 % (ref 0.0–0.2)

## 2022-12-02 LAB — BASIC METABOLIC PANEL
Anion gap: 13 (ref 5–15)
BUN: 17 mg/dL (ref 8–23)
CO2: 25 mmol/L (ref 22–32)
Calcium: 8.7 mg/dL — ABNORMAL LOW (ref 8.9–10.3)
Chloride: 99 mmol/L (ref 98–111)
Creatinine, Ser: 1.2 mg/dL (ref 0.61–1.24)
GFR, Estimated: >60 mL/min (ref >60)
Glucose, Bld: 90 mg/dL (ref 70–99)
Potassium: 4 mmol/L (ref 3.5–5.1)
Sodium: 137 mmol/L (ref 135–145)

## 2022-12-02 NOTE — H&P (View-Only) (Signed)
  Primary Care Physician: [name], Paul W, MD Primary Cardiologist: Dr. [name] Primary Electrophysiologist: Dr. Camnitz Referring Physician: Dr. Camnitz   [name] [name] is a 76 y.o. male with a history of CAD s/p CABG, OSA, HFrEF, s/p [employer] CRT-D implanted 04/30/22, T2DM, HTN, HLD, and persistent atrial fibrillation who presents for consultation in the [facility]. Patient underwent successful cardioversion on 10/16/22 x 2 shocks but unfortunately had ERAF. Seen by Dr. Camnitz on 10/27/22 and started on amiodarone as a bridge to ablation (scheduled for 01/19/23) with plan to repeat DCCV. He was admitted due to fall 5/13-15/24 with evidence of subdural hematoma on CT imaging. Eliquis held and plan per neurosurgery to resume in 1 month. He fell again on 5/18 and plan amended to hold Eliquis for one more week and to resume ~5/25. Patient is on Eliquis 5 mg BID (temporarily on hold due to subdural hematoma) for a CHADS2VASC score of 6.  On evaluation today, he appears to be in atrial flutter vs afib with slow ventricular response. He feels tired when out of rhythm. He restarted Eliquis 5 mg BID on 5/28 with 2 full doses. He feels well and now ambulates with a walker around the house to prevent additional falls. He is scheduled to see Neurology after scheduled ablation; family tried to move it sooner but unable to do so. Overall, he states he feels well and wishes to proceed with ablation as scheduled.   Today, he denies symptoms of palpitations, chest pain, shortness of breath, orthopnea, PND, lower extremity edema, dizziness, presyncope, syncope, snoring, daytime somnolence, bleeding, or neurologic sequela. The patient is tolerating medications without difficulties and is otherwise without complaint today.    he has a BMI of Body mass index is 26.68 kg/m.. Filed Weights   12/02/22 1120  Weight: 94.3 kg    Family History  Problem Relation Age of Onset   Cancer Mother     Lung disease Father        & heart disease   Colon polyps Father    Colon cancer Sister    Sudden death Maternal Grandmother    Anesthesia problems Daughter     Atrial Fibrillation Management history:  Previous antiarrhythmic drugs: amiodarone Previous cardioversions: 08/2013, 04/2019, 10/16/22 Previous ablations: None Anticoagulation history: Eliquis 5 mg BID    Past Medical History:  Diagnosis Date   Arthritis    Knee L - probably   Atrial fibrillation (HCC)    BPH (benign prostatic hyperplasia)    CAD (coronary artery disease) 07/06/2011   CHF (congestive heart failure) (HCC)    Coronary artery disease    Diabetes mellitus    Frequency of urination    Heart failure (HCC)    High cholesterol    History of nuclear stress test 09/2010   dipyridamole; mild perfusion defect due to attenuation with mild superimposed ischemia in apical septal, apical, apical inferior & apical lateral regions; rest LV enlarged in size; prominent gut uptake in infero-apical region; no significant ischemia demonstrated; low risk scan    HNP (herniated nucleus pulposus), lumbar    Hypertension    Ischemic cardiomyopathy    LV dysfunction 07/06/2011   Nocturia    Right bundle branch block    S/P CABG (coronary artery bypass graft) 08/03/2011   x3; LIMA to LAD,, SVG to PDA, SVG to posterolateral branch of RCA; Dr. B. Bartle   Sleep apnea    sleep study 10/2010- AHI during total sleep 32.1/hr and during   REM 62.3/hr (severe sleep apnea)unable to tolerate c pap   Spinal stenosis of lumbar region    Stroke (HCC)    [place]    Past Surgical History:  Procedure Laterality Date   ABDOMINAL AORTOGRAM W/LOWER EXTREMITY Left 11/14/2020   Procedure: ABDOMINAL AORTOGRAM W/LOWER EXTREMITY;  Surgeon: Clark, Christopher J, MD;  Location: [facility];  Service: Cardiovascular;  Laterality: Left;   ABDOMINAL AORTOGRAM W/LOWER EXTREMITY N/A 01/02/2021   Procedure: ABDOMINAL AORTOGRAM W/LOWER EXTREMITY;   Surgeon: Clark, Christopher J, MD;  Location: [facility];  Service: Cardiovascular;  Laterality: N/A;   AMPUTATION Left 03/20/2021   Procedure: Left foot 5th ray amputation;  Surgeon: Hewitt, John, MD;  Location: [facility];  Service: Orthopedics;  Laterality: Left;   BIV ICD INSERTION CRT-D N/A 04/30/2022   Procedure: BIV ICD INSERTION CRT-D;  Surgeon: Camnitz, Will [name], MD;  Location: [facility];  Service: Cardiovascular;  Laterality: N/A;   CARDIAC CATHETERIZATION  01/2011   ischemic cardiomyopathy 30-35%, multivessel CAD (Dr. T. Kelly)    CARDIAC CATHETERIZATION  09/13/2015   Procedure: Right/Left Heart Cath and Coronary/Graft Angiography;  Surgeon: Kenneth C [name], MD;  Location: [facility];  Service: Cardiovascular;;   CARDIOVERSION N/A 05/16/2019   Procedure: CARDIOVERSION;  Surgeon: [name], Kenneth C, MD;  Location: [facility];  Service: Cardiovascular;  Laterality: N/A;   CARDIOVERSION N/A 10/16/2022   Procedure: CARDIOVERSION;  Surgeon: Croitoru, Mihai, MD;  Location: [facility];  Service: Cardiovascular;  Laterality: N/A;   Colonscopy     CORONARY ARTERY BYPASS GRAFT  08/03/2011   Procedure: CORONARY ARTERY BYPASS GRAFTING (CABG);  Surgeon: Bryan K Bartle, MD;  Location: [facility];  Service: Open Heart Surgery;  Laterality: N/A;  CABG times three using right saphenous vein and left mammary artery usisng endoscope.   LEFT HEART CATH AND CORS/GRAFTS ANGIOGRAPHY N/A 12/15/2021   Procedure: LEFT HEART CATH AND CORS/GRAFTS ANGIOGRAPHY;  Surgeon: Cooper, Michael, MD;  [facility]: [facility];  Service: Cardiovascular;  Laterality: N/A;   LEFT HEART CATHETERIZATION WITH CORONARY/GRAFT ANGIOGRAM N/A 09/20/2013   Procedure: LEFT HEART CATHETERIZATION WITH CORONARY/GRAFT ANGIOGRAM;  Surgeon: Kenneth C. [name], MD;  Location: [facility];  Service: Cardiovascular;  Laterality: N/A;   Lower Extremity Arterial Doppler  03/16/2013   bilat ABIs demonstated normal values; R  runoff - posterior tibial & anterior tibial arteries occluded; L runoff - peroneal & posterior tibial arteries occluded, anterior tibial artery appears occluded   LUMBAR LAMINECTOMY/DECOMPRESSION MICRODISCECTOMY N/A 09/12/2014   Procedure: LUMBAR DECOMPRESSION L3-L4, L4-L5, MICRODISCECTOMY L4-L5;  Surgeon: Jeffrey Beane, MD;  Location: [facility];  Service: Orthopedics;  Laterality: N/A;   PERIPHERAL VASCULAR BALLOON ANGIOPLASTY  11/14/2020   Procedure: PERIPHERAL VASCULAR BALLOON ANGIOPLASTY;  Surgeon: Clark, Christopher J, MD;  [facility]: [facility];  Service: Cardiovascular;;   PERIPHERAL VASCULAR BALLOON ANGIOPLASTY Left 01/02/2021   Procedure: PERIPHERAL VASCULAR BALLOON ANGIOPLASTY;  Surgeon: Clark, Christopher J, MD;  [facility]: [facility];  Service: Cardiovascular;  Laterality: Left;  Failed PTA   Pilonidal Cyst removed     TRANSTHORACIC ECHOCARDIOGRAM  11/10/2012   EF 40-45%, mild LVH, mild hypokinesis of anteroseptal myocardium, grade 1 diastolic dysfunction; mild MR & calcifed mitral annulus; LA mild-mod dilated; RA mildly dilated    Current Outpatient Medications  Medication Sig Dispense Refill   acetaminophen (TYLENOL) 500 MG tablet Take 500-1,000 mg by mouth every 6 (six) hours as needed (for pain.).     amiodarone (PACERONE)   200 MG tablet Take 2 tablets (400 mg total) TWICE a day for 2 weeks, then take 1 tablet (200 mg total) TWICE a day for 2 weeks, then take 1 tablet (200 mg total) ONCE a day thereafter (Patient taking differently: Take 200 mg by mouth daily.) 90 tablet 2   carvedilol (COREG) 3.125 MG tablet Take 1 tablet (3.125 mg total) by mouth 2 (two) times daily with a meal. 180 tablet 3   empagliflozin (JARDIANCE) 25 MG TABS tablet Take 1 tablet (25 mg total) by mouth daily. 90 tablet 3   furosemide (LASIX) 40 MG tablet Take 1 1/2 tablet by mouth in the morning and 1 tablet by mouth in the afternoon for 4 days then go back down to 1 tablet by mouth twice a day (Patient  taking differently: Take 40 mg by mouth 2 (two) times daily. Take 1 1/2 tablet by mouth in the morning and 1 tablet by mouth in the afternoon for 4 days then go back down to 1 tablet by mouth twice a day) 180 tablet 3   gabapentin (NEURONTIN) 300 MG capsule Take 600 mg by mouth at bedtime.     glipiZIDE (GLUCOTROL XL) 10 MG 24 hr tablet Take 10 mg by mouth at bedtime.     metFORMIN (GLUCOPHAGE-XR) 500 MG 24 hr tablet Take 1,000 mg by mouth 2 (two) times daily.     ONETOUCH VERIO test strip 1 each daily.     rosuvastatin (CRESTOR) 10 MG tablet Take 1 tablet (10 mg total) by mouth every other day. 50 tablet 3   No current facility-administered medications for this encounter.    Allergies  Allergen Reactions   Entresto [Sacubitril-Valsartan] Other (See Comments)    dizziness   Sacubitril Other (See Comments)     Dizziness    Social History   Socioeconomic History   Marital status: Widowed    Spouse name: Not on file   Number of children: 1   Years of education: Not on file   Highest education level: Not on file  Occupational History   Occupation: real estate appraiser  Tobacco Use   Smoking status: Former    Types: Cigars    Quit date: 09/07/2011    Years since quitting: 11.2   Smokeless tobacco: Former    Quit date: 02/28/2012  Substance and Sexual Activity   Alcohol use: Not Currently    Alcohol/week: 1.0 - 2.0 standard drink of alcohol    Types: 1 - 2 Cans of beer per week   Drug use: No   Sexual activity: Not on file  Other Topics Concern   Not on file  Social History Narrative   ** Merged History Encounter **       Social Determinants of Health   Financial Resource Strain: Not on file  Food Insecurity: No Food Insecurity (11/09/2022)   Hunger Vital Sign    Worried About Running Out of Food in the Last Year: Never true    Ran Out of Food in the Last Year: Never true  Transportation Needs: No Transportation Needs (11/09/2022)   PRAPARE - Transportation    Lack of  Transportation (Medical): No    Lack of Transportation (Non-Medical): No  Physical Activity: Not on file  Stress: Not on file  Social Connections: Not on file  Intimate Partner Violence: Not At Risk (11/09/2022)   Humiliation, Afraid, Rape, and Kick questionnaire    Fear of Current or Ex-Partner: No    Emotionally Abused: No      Physically Abused: No    Sexually Abused: No    ROS- All systems are reviewed and negative except as per the HPI above.  Physical Exam: Vitals:   12/02/22 1120  BP: (!) 120/54  Pulse: (!) 52  Weight: 94.3 kg  Height: 6' 2" (1.88 m)    GEN- The patient is a well appearing male, alert and oriented x 3 today.   Head- normocephalic, atraumatic Eyes-  Sclera clear, conjunctiva pink Ears- hearing intact Oropharynx- clear Neck- supple  Lungs- Clear to ausculation bilaterally, normal work of breathing Heart- Bradycardic regular rate and rhythm, no murmurs, rubs or gallops  GI- soft, NT, ND, + BS Extremities- no clubbing, cyanosis, or edema MS- no significant deformity or atrophy Skin- no rash or lesion Psych- euthymic mood, full affect Neuro- strength and sensation are intact  Wt Readings from Last 3 Encounters:  12/02/22 94.3 kg  11/14/22 97.5 kg  11/09/22 97.5 kg    EKG today demonstrates  Vent. rate 52 BPM PR interval * ms QRS duration 162 ms QT/QTcB 548/509 ms P-R-T axes * -87 -28 Ventricular-paced rhythm Biventricular pacemaker detected Abnormal ECG When compared with ECG of 09-Nov-2022 11:33, PREVIOUS ECG IS PRESENT  Echo 11/10/22 demonstrated:  1. Left ventricular ejection fraction, by estimation, is 25 to 30%. The  left ventricle has severely decreased function. The left ventricle  demonstrates global hypokinesis. The left ventricular internal cavity size  was mildly to moderately dilated. Left  ventricular diastolic parameters are indeterminate. Elevated left  ventricular end-diastolic pressure.   2. Right ventricular systolic  function is moderately reduced. The right  ventricular size is moderately enlarged.   3. The mitral valve is normal in structure. Moderate mitral valve  regurgitation. No evidence of mitral stenosis.   4. Tricuspid valve regurgitation is mild to moderate.   5. The aortic valve is tricuspid. Aortic valve regurgitation is not  visualized. No aortic stenosis is present.   6. The inferior vena cava is normal in size with greater than 50%  respiratory variability, suggesting right atrial pressure of 3 mmHg.  Epic records are reviewed at length today.  CHA2DS2-VASc Score = 6  The patient's score is based upon: CHF History: 1 HTN History: 1 Diabetes History: 1 Stroke History: 0 Vascular Disease History: 1 Age Score: 2 Gender Score: 0       ASSESSMENT AND PLAN: Longstanding persistent Atrial Fibrillation (ICD10:  I48.19) The patient's CHA2DS2-VASc score is 6, indicating a 9.7% annual risk of stroke.    After discussion with family and patient, we will proceed with scheduling DCCV.  He will be scheduled 3 weeks from date of 5/28 when began full anticoagulation on Eliquis.  I will discuss with Dr. Camnitz that patient will not be seen by Neurology until after ablation. Otherwise, patient is stating he would like to proceed with ablation as scheduled.  Secondary Hypercoagulable State (ICD10:  D68.69) The patient is at significant risk for stroke/thromboembolism based upon his CHA2DS2-VASc Score of 6.  Continue Apixaban (Eliquis).  No missed doses since 5/28.    F/u 1-2 weeks after DCCV.   Deondrea Markos, PA-C [facility] [address] [place], [place] [address] [phone] 12/02/2022 12:06 PM  

## 2022-12-02 NOTE — Progress Notes (Signed)
Remote ICD transmission.   

## 2022-12-02 NOTE — Progress Notes (Addendum)
Primary Care Physician: [name], MD Primary Cardiologist: Dr. [name] Primary Electrophysiologist: Dr. [name] Referring Physician: Dr. Jackquline Berlin [name] [name] is a 77 y.o. male with a history of CAD s/p CABG, OSA, HFrEF, s/p Medtronic CRT-D implanted 04/30/22, T2DM, HTN, HLD, and persistent atrial fibrillation who presents for consultation in the [facility]. Patient underwent successful cardioversion on 10/16/22 x 2 shocks but unfortunately had ERAF. Seen by Dr. [name] on 10/27/22 and started on amiodarone as a bridge to ablation (scheduled for 01/19/23) with plan to repeat DCCV. He was admitted due to fall 5/13-15/24 with evidence of subdural hematoma on CT imaging. Eliquis held and plan per neurosurgery to resume in 1 month. He fell again on 5/18 and plan amended to hold Eliquis for one more week and to resume ~5/25. Patient is on Eliquis 5 mg BID (temporarily on hold due to subdural hematoma) for a CHADS2VASC score of 6.  On evaluation today, he appears to be in atrial flutter vs afib with slow ventricular response. He feels tired when out of rhythm. He restarted Eliquis 5 mg BID on 5/28 with 2 full doses. He feels well and now ambulates with a walker around the house to prevent additional falls. He is scheduled to see Neurology after scheduled ablation; family tried to move it sooner but unable to do so. Overall, he states he feels well and wishes to proceed with ablation as scheduled.   Today, he denies symptoms of palpitations, chest pain, shortness of breath, orthopnea, PND, lower extremity edema, dizziness, presyncope, syncope, snoring, daytime somnolence, bleeding, or neurologic sequela. The patient is tolerating medications without difficulties and is otherwise without complaint today.    he has a BMI of Body mass index is 26.68 kg/m.Marland Kitchen Filed Weights   12/02/22 1120  Weight: 94.3 kg    Family History  Problem Relation Age of Onset   Cancer Mother     Lung disease Father        & heart disease   Colon polyps Father    Colon cancer Sister    Sudden death Maternal Grandmother    Anesthesia problems Daughter     Atrial Fibrillation Management history:  Previous antiarrhythmic drugs: amiodarone Previous cardioversions: 08/2013, 04/2019, 10/16/22 Previous ablations: None Anticoagulation history: Eliquis 5 mg BID    Past Medical History:  Diagnosis Date   Arthritis    Knee L - probably   Atrial fibrillation (HCC)    BPH (benign prostatic hyperplasia)    CAD (coronary artery disease) 07/06/2011   CHF (congestive heart failure) (HCC)    Coronary artery disease    Diabetes mellitus    Frequency of urination    Heart failure (HCC)    High cholesterol    History of nuclear stress test 09/2010   dipyridamole; mild perfusion defect due to attenuation with mild superimposed ischemia in apical septal, apical, apical inferior & apical lateral regions; rest LV enlarged in size; prominent gut uptake in infero-apical region; no significant ischemia demonstrated; low risk scan    HNP (herniated nucleus pulposus), lumbar    Hypertension    Ischemic cardiomyopathy    LV dysfunction 07/06/2011   Nocturia    Right bundle branch block    S/P CABG (coronary artery bypass graft) 08/03/2011   x3; LIMA to LAD,, SVG to PDA, SVG to posterolateral branch of RCA; Dr. Wayland Salinas   Sleep apnea    sleep study 10/2010- AHI during total sleep 32.1/hr and during  REM 62.3/hr (severe sleep apnea)unable to tolerate c pap   Spinal stenosis of lumbar region    Stroke [facility])    [place]    Past Surgical History:  Procedure Laterality Date   ABDOMINAL AORTOGRAM W/LOWER EXTREMITY Left 11/14/2020   Procedure: ABDOMINAL AORTOGRAM W/LOWER EXTREMITY;  Surgeon: Cephus Shelling, MD;  Location: [facility] INVASIVE CV LAB;  Service: Cardiovascular;  Laterality: Left;   ABDOMINAL AORTOGRAM W/LOWER EXTREMITY N/A 01/02/2021   Procedure: ABDOMINAL AORTOGRAM W/LOWER EXTREMITY;   Surgeon: Cephus Shelling, MD;  Location: [facility] INVASIVE CV LAB;  Service: Cardiovascular;  Laterality: N/A;   AMPUTATION Left 03/20/2021   Procedure: Left foot 5th ray amputation;  Surgeon: Toni Arthurs, MD;  Location: [facility] OR;  Service: Orthopedics;  Laterality: Left;   BIV ICD INSERTION CRT-D N/A 04/30/2022   Procedure: BIV ICD INSERTION CRT-D;  Surgeon: [name], MD;  Location: [facility] INVASIVE CV LAB;  Service: Cardiovascular;  Laterality: N/A;   CARDIAC CATHETERIZATION  01/2011   ischemic cardiomyopathy 30-35%, multivessel CAD (Dr. Bishop Limbo)    CARDIAC CATHETERIZATION  09/13/2015   Procedure: Right/Left Heart Cath and Coronary/Graft Angiography;  Surgeon: [name], MD;  Location: [facility] INVASIVE CV LAB;  Service: Cardiovascular;;   CARDIOVERSION N/A 05/16/2019   Procedure: CARDIOVERSION;  Surgeon: [name], MD;  Location: [facility] ENDOSCOPY;  Service: Cardiovascular;  Laterality: N/A;   CARDIOVERSION N/A 10/16/2022   Procedure: CARDIOVERSION;  Surgeon: Thurmon Fair, MD;  Location: [facility] INVASIVE CV LAB;  Service: Cardiovascular;  Laterality: N/A;   Colonscopy     CORONARY ARTERY BYPASS GRAFT  08/03/2011   Procedure: CORONARY ARTERY BYPASS GRAFTING (CABG);  Surgeon: Alleen Borne, MD;  Location: [facility] OR;  Service: Open Heart Surgery;  Laterality: N/A;  CABG times three using right saphenous vein and left mammary artery usisng endoscope.   LEFT HEART CATH AND CORS/GRAFTS ANGIOGRAPHY N/A 12/15/2021   Procedure: LEFT HEART CATH AND CORS/GRAFTS ANGIOGRAPHY;  Surgeon: Tonny Bollman, MD;  Location: [facility] INVASIVE CV LAB;  Service: Cardiovascular;  Laterality: N/A;   LEFT HEART CATHETERIZATION WITH CORONARY/GRAFT ANGIOGRAM N/A 09/20/2013   Procedure: LEFT HEART CATHETERIZATION WITH Isabel Caprice;  Surgeon: [name], MD;  Location: [facility] CATH LAB;  Service: Cardiovascular;  Laterality: N/A;   Lower Extremity Arterial Doppler  03/16/2013   bilat ABIs demonstated normal values; R  runoff - posterior tibial & anterior tibial arteries occluded; L runoff - peroneal & posterior tibial arteries occluded, anterior tibial artery appears occluded   LUMBAR LAMINECTOMY/DECOMPRESSION MICRODISCECTOMY N/A 09/12/2014   Procedure: LUMBAR DECOMPRESSION L3-L4, L4-L5, MICRODISCECTOMY L4-L5;  Surgeon: Jene Every, MD;  Location: [facility] ORS;  Service: Orthopedics;  Laterality: N/A;   PERIPHERAL VASCULAR BALLOON ANGIOPLASTY  11/14/2020   Procedure: PERIPHERAL VASCULAR BALLOON ANGIOPLASTY;  Surgeon: Cephus Shelling, MD;  Location: [facility] INVASIVE CV LAB;  Service: Cardiovascular;;   PERIPHERAL VASCULAR BALLOON ANGIOPLASTY Left 01/02/2021   Procedure: PERIPHERAL VASCULAR BALLOON ANGIOPLASTY;  Surgeon: Cephus Shelling, MD;  Location: [facility] INVASIVE CV LAB;  Service: Cardiovascular;  Laterality: Left;  Failed PTA   Pilonidal Cyst removed     TRANSTHORACIC ECHOCARDIOGRAM  11/10/2012   EF 40-45%, mild LVH, mild hypokinesis of anteroseptal myocardium, grade 1 diastolic dysfunction; mild MR & calcifed mitral annulus; LA mild-mod dilated; RA mildly dilated    Current Outpatient Medications  Medication Sig Dispense Refill   acetaminophen (TYLENOL) 500 MG tablet Take 500-1,000 mg by mouth every 6 (six) hours as needed (for pain.).     amiodarone (PACERONE)  200 MG tablet Take 2 tablets (400 mg total) TWICE a day for 2 weeks, then take 1 tablet (200 mg total) TWICE a day for 2 weeks, then take 1 tablet (200 mg total) ONCE a day thereafter (Patient taking differently: Take 200 mg by mouth daily.) 90 tablet 2   carvedilol (COREG) 3.125 MG tablet Take 1 tablet (3.125 mg total) by mouth 2 (two) times daily with a meal. 180 tablet 3   empagliflozin (JARDIANCE) 25 MG TABS tablet Take 1 tablet (25 mg total) by mouth daily. 90 tablet 3   furosemide (LASIX) 40 MG tablet Take 1 1/2 tablet by mouth in the morning and 1 tablet by mouth in the afternoon for 4 days then go back down to 1 tablet by mouth twice a day (Patient  taking differently: Take 40 mg by mouth 2 (two) times daily. Take 1 1/2 tablet by mouth in the morning and 1 tablet by mouth in the afternoon for 4 days then go back down to 1 tablet by mouth twice a day) 180 tablet 3   gabapentin (NEURONTIN) 300 MG capsule Take 600 mg by mouth at bedtime.     glipiZIDE (GLUCOTROL XL) 10 MG 24 hr tablet Take 10 mg by mouth at bedtime.     metFORMIN (GLUCOPHAGE-XR) 500 MG 24 hr tablet Take 1,000 mg by mouth 2 (two) times daily.     ONETOUCH VERIO test strip 1 each daily.     rosuvastatin (CRESTOR) 10 MG tablet Take 1 tablet (10 mg total) by mouth every other day. 50 tablet 3   No current facility-administered medications for this encounter.    Allergies  Allergen Reactions   Entresto [Sacubitril-Valsartan] Other (See Comments)    dizziness   Sacubitril Other (See Comments)     Dizziness    Social History   Socioeconomic History   Marital status: Widowed    Spouse name: Not on file   Number of children: 1   Years of education: Not on file   Highest education level: Not on file  Occupational History   Occupation: real Psychologist, occupational  Tobacco Use   Smoking status: Former    Types: Cigars    Quit date: 09/07/2011    Years since quitting: 11.2   Smokeless tobacco: Former    Quit date: 02/28/2012  Substance and Sexual Activity   Alcohol use: Not Currently    Alcohol/week: 1.0 - 2.0 standard drink of alcohol    Types: 1 - 2 Cans of beer per week   Drug use: No   Sexual activity: Not on file  Other Topics Concern   Not on file  Social History Narrative   ** Merged History Encounter **       Social Determinants of Health   Financial Resource Strain: Not on file  Food Insecurity: No Food Insecurity (11/09/2022)   Hunger Vital Sign    Worried About Running Out of Food in the Last Year: Never true    Ran Out of Food in the Last Year: Never true  Transportation Needs: No Transportation Needs (11/09/2022)   PRAPARE - Scientist, research (physical sciences) (Medical): No    Lack of Transportation (Non-Medical): No  Physical Activity: Not on file  Stress: Not on file  Social Connections: Not on file  Intimate Partner Violence: Not At Risk (11/09/2022)   Humiliation, Afraid, Rape, and Kick questionnaire    Fear of Current or Ex-Partner: No    Emotionally Abused: No  Physically Abused: No    Sexually Abused: No    ROS- All systems are reviewed and negative except as per the HPI above.  Physical Exam: Vitals:   12/02/22 1120  BP: (!) 120/54  Pulse: (!) 52  Weight: 94.3 kg  Height: 6\' 2"  (1.88 m)    GEN- The patient is a well appearing male, alert and oriented x 3 today.   Head- normocephalic, atraumatic Eyes-  Sclera clear, conjunctiva pink Ears- hearing intact Oropharynx- clear Neck- supple  Lungs- Clear to ausculation bilaterally, normal work of breathing Heart- Bradycardic regular rate and rhythm, no murmurs, rubs or gallops  GI- soft, NT, ND, + BS Extremities- no clubbing, cyanosis, or edema MS- no significant deformity or atrophy Skin- no rash or lesion Psych- euthymic mood, full affect Neuro- strength and sensation are intact  Wt Readings from Last 3 Encounters:  12/02/22 94.3 kg  11/14/22 97.5 kg  11/09/22 97.5 kg    EKG today demonstrates  Vent. rate 52 BPM PR interval * ms QRS duration 162 ms QT/QTcB 548/509 ms P-R-T axes * -87 -28 Ventricular-paced rhythm Biventricular pacemaker detected Abnormal ECG When compared with ECG of 09-Nov-2022 11:33, PREVIOUS ECG IS PRESENT  Echo 11/10/22 demonstrated:  1. Left ventricular ejection fraction, by estimation, is 25 to 30%. The  left ventricle has severely decreased function. The left ventricle  demonstrates global hypokinesis. The left ventricular internal cavity size  was mildly to moderately dilated. Left  ventricular diastolic parameters are indeterminate. Elevated left  ventricular end-diastolic pressure.   2. Right ventricular systolic  function is moderately reduced. The right  ventricular size is moderately enlarged.   3. The mitral valve is normal in structure. Moderate mitral valve  regurgitation. No evidence of mitral stenosis.   4. Tricuspid valve regurgitation is mild to moderate.   5. The aortic valve is tricuspid. Aortic valve regurgitation is not  visualized. No aortic stenosis is present.   6. The inferior vena cava is normal in size with greater than 50%  respiratory variability, suggesting right atrial pressure of 3 mmHg.  Epic records are reviewed at length today.  CHA2DS2-VASc Score = 6  The patient's score is based upon: CHF History: 1 HTN History: 1 Diabetes History: 1 Stroke History: 0 Vascular Disease History: 1 Age Score: 2 Gender Score: 0       ASSESSMENT AND PLAN: Longstanding persistent Atrial Fibrillation (ICD10:  I48.19) The patient's CHA2DS2-VASc score is 6, indicating a 9.7% annual risk of stroke.    After discussion with family and patient, we will proceed with scheduling DCCV.  He will be scheduled 3 weeks from date of 5/28 when began full anticoagulation on Eliquis.  I will discuss with Dr. [name] that patient will not be seen by Neurology until after ablation. Otherwise, patient is stating he would like to proceed with ablation as scheduled.  Secondary Hypercoagulable State (ICD10:  D68.69) The patient is at significant risk for stroke/thromboembolism based upon his CHA2DS2-VASc Score of 6.  Continue Apixaban (Eliquis).  No missed doses since 5/28.    F/u 1-2 weeks after DCCV.   [name], PA-C Afib Clinic [facility] [address] [place], [place] [address] [phone] 12/02/2022 12:06 PM

## 2022-12-02 NOTE — Patient Instructions (Signed)
Cardioversion scheduled for: Wednesday, June 19th   - Arrive at the [facility] and go to admitting at 11am   - Do not eat or drink anything after midnight the night prior to your procedure.   - Take all your morning medication (except diabetic medications) with a sip of water prior to arrival.  - You will not be able to drive home after your procedure.    - Do NOT miss any doses of your blood thinner - if you should miss a dose please notify our office immediately.   - If you feel as if you go back into normal rhythm prior to scheduled cardioversion, please notify our office immediately.   If your procedure is canceled in the cardioversion suite you will be charged a cancellation fee.   Hold below medications 72 hours prior to scheduled procedure/anesthesia. Restart medication on the following day after scheduled procedure/anesthesia Bexagliflozin (Brenzavvy) Canagliflozin (Invokana) Canagliflozin/metformin (Invokamet/Invokamet XR) Dapagliflozin Marcelline Deist) Dapagliflozin/metformin (Xigduo XR) Dapagliflozin/saxagliptin Colbert Coyer) Empagliflozin (Jardiance) Empagliflozin/linagliptin (Glyxambi) Empagliflozin/linagliptin/metformin (Trijardy XR) Empagliflozin/metformin (Synjardy/Synjardy XR) Ertugliflozin (Steglatro) Ertugliflozin/metformin (Segluromet) Ertugliflozin/sitagliptin Engineer, structural)         For those patients who have a scheduled procedure/anesthesia on the same day of the week as their dose, hold the medication on the day of surgery.  They can take their scheduled dose the week before.  **Patients on the above medications scheduled for elective procedures that have not held the medication for the appropriate amount of time are at risk of cancellation or change in the anesthetic plan.

## 2022-12-03 DIAGNOSIS — M6281 Muscle weakness (generalized): Secondary | ICD-10-CM | POA: Diagnosis not present

## 2022-12-03 DIAGNOSIS — R262 Difficulty in walking, not elsewhere classified: Secondary | ICD-10-CM | POA: Diagnosis not present

## 2022-12-03 DIAGNOSIS — S065X0S Traumatic subdural hemorrhage without loss of consciousness, sequela: Secondary | ICD-10-CM | POA: Diagnosis not present

## 2022-12-03 DIAGNOSIS — R2681 Unsteadiness on feet: Secondary | ICD-10-CM | POA: Diagnosis not present

## 2022-12-06 NOTE — Pre-Procedure Instructions (Signed)
Patient is scheduled for anesthesia procedure on Wednesday June 19.  Spoke with patient he understands that he is not suppost to take Jardiance on Sunday 6/16, Monday 6/17 and Tuesday 6/18.  Don't take day of the procedure.

## 2022-12-07 DIAGNOSIS — R262 Difficulty in walking, not elsewhere classified: Secondary | ICD-10-CM | POA: Diagnosis not present

## 2022-12-07 DIAGNOSIS — M6281 Muscle weakness (generalized): Secondary | ICD-10-CM | POA: Diagnosis not present

## 2022-12-07 DIAGNOSIS — S065X0S Traumatic subdural hemorrhage without loss of consciousness, sequela: Secondary | ICD-10-CM | POA: Diagnosis not present

## 2022-12-07 DIAGNOSIS — R2681 Unsteadiness on feet: Secondary | ICD-10-CM | POA: Diagnosis not present

## 2022-12-09 DIAGNOSIS — S065X0S Traumatic subdural hemorrhage without loss of consciousness, sequela: Secondary | ICD-10-CM | POA: Diagnosis not present

## 2022-12-09 DIAGNOSIS — M6281 Muscle weakness (generalized): Secondary | ICD-10-CM | POA: Diagnosis not present

## 2022-12-09 DIAGNOSIS — R262 Difficulty in walking, not elsewhere classified: Secondary | ICD-10-CM | POA: Diagnosis not present

## 2022-12-09 DIAGNOSIS — R2681 Unsteadiness on feet: Secondary | ICD-10-CM | POA: Diagnosis not present

## 2022-12-10 ENCOUNTER — Encounter: Payer: Self-pay | Admitting: Neurology

## 2022-12-10 ENCOUNTER — Ambulatory Visit (INDEPENDENT_AMBULATORY_CARE_PROVIDER_SITE_OTHER): Payer: Medicare Other | Admitting: Neurology

## 2022-12-10 VITALS — Ht 74.0 in | Wt 207.0 lb

## 2022-12-10 DIAGNOSIS — I951 Orthostatic hypotension: Secondary | ICD-10-CM | POA: Diagnosis not present

## 2022-12-10 DIAGNOSIS — Z9181 History of falling: Secondary | ICD-10-CM

## 2022-12-10 DIAGNOSIS — G4733 Obstructive sleep apnea (adult) (pediatric): Secondary | ICD-10-CM | POA: Diagnosis not present

## 2022-12-10 DIAGNOSIS — R2689 Other abnormalities of gait and mobility: Secondary | ICD-10-CM | POA: Diagnosis not present

## 2022-12-10 DIAGNOSIS — S065XAA Traumatic subdural hemorrhage with loss of consciousness status unknown, initial encounter: Secondary | ICD-10-CM

## 2022-12-10 DIAGNOSIS — W19XXXS Unspecified fall, sequela: Secondary | ICD-10-CM

## 2022-12-10 NOTE — Progress Notes (Signed)
Subjective:    Patient ID: [name] [name] is a 77 y.o. male.  HPI    [name] Foley, MD, PhD [facility] [address], [address] [address] [place], [place] [address]  Dear Dr. Neita Carp,  I saw your patient, [name] [name], upon your kind request in my neurologic clinic today for initial consultation of his gait disorder and recent falls.  The patient is accompanied by his daughter, [name] [name], today. As you know, [name] [name] is a 77 year old male with an underlying complex medical history of coronary artery disease with status post CABG in 2013, ischemic cardiomyopathy, left ventricular dysfunction, right bundle branch block, congestive heart failure, status post biventricular ICD placement in November 2023, chronic A-fib with status post cardioversion in 2020, also in April 2024, pending cardioversion this month, peripheral artery disease with status post peripheral vascular balloon angioplasty, left foot fifth ray amputation, osteomyelitis, BPH, diabetes, hypertension, spinal stenosis of the lumbar region, history of stroke, history of sleep apnea, and overweight state, history of subdural hematoma in the context of fall with head injury in May 2024, who reports recent falls in May 2024.  He fell backwards both times.  He does have occasional lightheadedness, he has been using a walker since then at home, usually first thing in the morning but not all day, he brought a cane today.  His daughter reports that he lives alone, he has been on amiodarone for the past 2 months or so.  He did has lowered the dose to 1 pill once daily for now.  He tries to hydrate well, estimates that he drinks about 4-5 bottles of water per day, 16.9 ounce size each.  He is a non-smoker, used to smoke cigars in the past, he does not drink caffeine daily, usually decaf coffee in the morning.  He does not drink any alcohol.  He has been on gabapentin for neuropathy.  He has been on it for years.  His daughter  reports that he has had trouble with his left side including the left leg with neuropathy and ulcer as well as osteomyelitis.  Of note, he was admitted to the hospital in mid May after a fall.  He was found to have a subdural hematoma.  I reviewed the hospital records.  He was on Eliquis at the time and resumed Eliquis on 11/23/2022, per daughter.  Neurosurgical consultation was obtained in the hospital.  He was in the hospital from 11/09/2022 through 11/11/2022.    He had a head CT without contrast and cervical spine CT without contrast on 11/09/2022 and I reviewed the results:  IMPRESSION: 1. Acute subdural hemorrhage along the anterior falx measuring 4 mm in thickness. No mass effect.   IMPRESSION: 1. No acute fracture or traumatic listhesis of the cervical spine. 2. Mild-moderate multilevel degenerative changes of the cervical spine.   He had a head CT without contrast on 11/10/2022 and I reviewed the results:  IMPRESSION: 1. Unchanged small volume right para falcine subdural blood. No associated intracranial mass effect. 2. No new intracranial abnormality. Chronic left cerebellar infarct.   He had a repeat head CT without contrast on 11/14/2022 and I reviewed the results:  IMPRESSION: 1. Known right parafalcine subdural hematoma has overall decreased in size compared to prior. Focally measures up to 3-4 mm in thickness in the right frontal region. No associated mass effect or midline shift. 2. Small amount of layering subdural hematoma along the right tentorium appears more conspicuous on today's exam. 3. Posterior  left parietooccipital scalp hematoma with overlying skin staples. No underlying calvarial fracture.   He had a lumbar spine CT without contrast on 11/14/2022 and I reviewed the results: Impression: No acute finding. Bridging osteophytes from T11-L2, at L5-S1 and the bilateral sacroiliac joints. Degenerative endplate and facet spurring the open levels. Prior laminectomy at  L4. diffusely patent appearance of the spinal canal  He is supposed to have a cardioversion next week.  He is scheduled for atrial fibrillation ablation in July 2024.  He presented to the emergency room on 11/14/2022 after a fall.  He sustained an occipital scalp laceration, which was repaired with staples (4).  He lives alone, he drives locally.  He does not have a call alert button but has an Scientist, physiological with fall detection.  He reports he is seeing a CPAP or similar machine, he reports compliance but has not had a yearly checkup on a regular basis, he is followed by Dr. Mayford Knife in cardiology for this.  His Past Medical History Is Significant For: Past Medical History:  Diagnosis Date   Arthritis    Knee L - probably   Atrial fibrillation (HCC)    BPH (benign prostatic hyperplasia)    CAD (coronary artery disease) 07/06/2011   CHF (congestive heart failure) (HCC)    Coronary artery disease    Diabetes mellitus    Frequency of urination    Heart failure (HCC)    High cholesterol    History of nuclear stress test 09/2010   dipyridamole; mild perfusion defect due to attenuation with mild superimposed ischemia in apical septal, apical, apical inferior & apical lateral regions; rest LV enlarged in size; prominent gut uptake in infero-apical region; no significant ischemia demonstrated; low risk scan    HNP (herniated nucleus pulposus), lumbar    Hypertension    Ischemic cardiomyopathy    LV dysfunction 07/06/2011   Nocturia    Right bundle branch block    S/P CABG (coronary artery bypass graft) 08/03/2011   x3; LIMA to LAD,, SVG to PDA, SVG to posterolateral branch of RCA; Dr. Wayland Salinas   Sleep apnea    sleep study 10/2010- AHI during total sleep 32.1/hr and during REM 62.3/hr (severe sleep apnea)unable to tolerate c pap   Spinal stenosis of lumbar region    Stroke [facility])    [place]     His Past Surgical History Is Significant For: Past Surgical History:  Procedure Laterality  Date   ABDOMINAL AORTOGRAM W/LOWER EXTREMITY Left 11/14/2020   Procedure: ABDOMINAL AORTOGRAM W/LOWER EXTREMITY;  Surgeon: Cephus Shelling, MD;  Location: [facility] INVASIVE CV LAB;  Service: Cardiovascular;  Laterality: Left;   ABDOMINAL AORTOGRAM W/LOWER EXTREMITY N/A 01/02/2021   Procedure: ABDOMINAL AORTOGRAM W/LOWER EXTREMITY;  Surgeon: Cephus Shelling, MD;  Location: [facility] INVASIVE CV LAB;  Service: Cardiovascular;  Laterality: N/A;   AMPUTATION Left 03/20/2021   Procedure: Left foot 5th ray amputation;  Surgeon: Toni Arthurs, MD;  Location: [facility] OR;  Service: Orthopedics;  Laterality: Left;   BIV ICD INSERTION CRT-D N/A 04/30/2022   Procedure: BIV ICD INSERTION CRT-D;  Surgeon: [name], MD;  Location: [facility] INVASIVE CV LAB;  Service: Cardiovascular;  Laterality: N/A;   CARDIAC CATHETERIZATION  01/2011   ischemic cardiomyopathy 30-35%, multivessel CAD (Dr. Bishop Limbo)    CARDIAC CATHETERIZATION  09/13/2015   Procedure: Right/Left Heart Cath and Coronary/Graft Angiography;  Surgeon: [name], MD;  Location: [facility] INVASIVE CV LAB;  Service: Cardiovascular;;   CARDIOVERSION N/A 05/16/2019  Procedure: CARDIOVERSION;  Surgeon: [name], MD;  Location: [facility] ENDOSCOPY;  Service: Cardiovascular;  Laterality: N/A;   CARDIOVERSION N/A 10/16/2022   Procedure: CARDIOVERSION;  Surgeon: Thurmon Fair, MD;  Location: [facility] INVASIVE CV LAB;  Service: Cardiovascular;  Laterality: N/A;   Colonscopy     CORONARY ARTERY BYPASS GRAFT  08/03/2011   Procedure: CORONARY ARTERY BYPASS GRAFTING (CABG);  Surgeon: Alleen Borne, MD;  Location: [facility] OR;  Service: Open Heart Surgery;  Laterality: N/A;  CABG times three using right saphenous vein and left mammary artery usisng endoscope.   LEFT HEART CATH AND CORS/GRAFTS ANGIOGRAPHY N/A 12/15/2021   Procedure: LEFT HEART CATH AND CORS/GRAFTS ANGIOGRAPHY;  Surgeon: Tonny Bollman, MD;  Location: [facility] INVASIVE CV LAB;  Service: Cardiovascular;  Laterality: N/A;   LEFT  HEART CATHETERIZATION WITH CORONARY/GRAFT ANGIOGRAM N/A 09/20/2013   Procedure: LEFT HEART CATHETERIZATION WITH Isabel Caprice;  Surgeon: [name], MD;  Location: [facility] CATH LAB;  Service: Cardiovascular;  Laterality: N/A;   Lower Extremity Arterial Doppler  03/16/2013   bilat ABIs demonstated normal values; R runoff - posterior tibial & anterior tibial arteries occluded; L runoff - peroneal & posterior tibial arteries occluded, anterior tibial artery appears occluded   LUMBAR LAMINECTOMY/DECOMPRESSION MICRODISCECTOMY N/A 09/12/2014   Procedure: LUMBAR DECOMPRESSION L3-L4, L4-L5, MICRODISCECTOMY L4-L5;  Surgeon: Jene Every, MD;  Location: [facility] ORS;  Service: Orthopedics;  Laterality: N/A;   PERIPHERAL VASCULAR BALLOON ANGIOPLASTY  11/14/2020   Procedure: PERIPHERAL VASCULAR BALLOON ANGIOPLASTY;  Surgeon: Cephus Shelling, MD;  Location: [facility] INVASIVE CV LAB;  Service: Cardiovascular;;   PERIPHERAL VASCULAR BALLOON ANGIOPLASTY Left 01/02/2021   Procedure: PERIPHERAL VASCULAR BALLOON ANGIOPLASTY;  Surgeon: Cephus Shelling, MD;  Location: [facility] INVASIVE CV LAB;  Service: Cardiovascular;  Laterality: Left;  Failed PTA   Pilonidal Cyst removed     TRANSTHORACIC ECHOCARDIOGRAM  11/10/2012   EF 40-45%, mild LVH, mild hypokinesis of anteroseptal myocardium, grade 1 diastolic dysfunction; mild MR & calcifed mitral annulus; LA mild-mod dilated; RA mildly dilated    His Family History Is Significant For: Family History  Problem Relation Age of Onset   Cancer Mother    Lung disease Father        & heart disease   Colon polyps Father    Colon cancer Sister    Sudden death Maternal Grandmother    Anesthesia problems Daughter     His Social History Is Significant For: Social History   Socioeconomic History   Marital status: Widowed    Spouse name: Not on file   Number of children: 1   Years of education: Not on file   Highest education level: Not on file  Occupational History    Occupation: real Psychologist, occupational  Tobacco Use   Smoking status: Former    Types: Cigars    Quit date: 09/07/2011    Years since quitting: 11.2   Smokeless tobacco: Former    Quit date: 02/28/2012  Substance and Sexual Activity   Alcohol use: Not Currently    Alcohol/week: 1.0 - 2.0 standard drink of alcohol    Types: 1 - 2 Cans of beer per week   Drug use: No   Sexual activity: Not on file  Other Topics Concern   Not on file  Social History Narrative   ** Merged History Encounter **       Social Determinants of Health   Financial Resource Strain: Not on file  Food Insecurity: No Food Insecurity (11/09/2022)   Hunger Vital  Sign    Worried About Programme researcher, broadcasting/film/video in the Last Year: Never true    Ran Out of Food in the Last Year: Never true  Transportation Needs: No Transportation Needs (11/09/2022)   PRAPARE - Administrator, Civil Service (Medical): No    Lack of Transportation (Non-Medical): No  Physical Activity: Not on file  Stress: Not on file  Social Connections: Not on file    His Allergies Are:  Allergies  Allergen Reactions   Entresto [Sacubitril-Valsartan] Other (See Comments)    dizziness   Sacubitril Other (See Comments)     Dizziness  :   His Current Medications Are:  Outpatient Encounter Medications as of 12/10/2022  Medication Sig   acetaminophen (TYLENOL) 500 MG tablet Take 500-1,000 mg by mouth every 6 (six) hours as needed (for pain.).   amiodarone (PACERONE) 200 MG tablet Take 2 tablets (400 mg total) TWICE a day for 2 weeks, then take 1 tablet (200 mg total) TWICE a day for 2 weeks, then take 1 tablet (200 mg total) ONCE a day thereafter (Patient taking differently: Take 200 mg by mouth daily.)   carvedilol (COREG) 3.125 MG tablet Take 1 tablet (3.125 mg total) by mouth 2 (two) times daily with a meal.   empagliflozin (JARDIANCE) 25 MG TABS tablet Take 1 tablet (25 mg total) by mouth daily.   furosemide (LASIX) 40 MG tablet Take 1 1/2  tablet by mouth in the morning and 1 tablet by mouth in the afternoon for 4 days then go back down to 1 tablet by mouth twice a day (Patient taking differently: Take 40 mg by mouth 2 (two) times daily. Take 1 1/2 tablet by mouth in the morning and 1 tablet by mouth in the afternoon for 4 days then go back down to 1 tablet by mouth twice a day)   gabapentin (NEURONTIN) 300 MG capsule Take 600 mg by mouth at bedtime.   glipiZIDE (GLUCOTROL XL) 10 MG 24 hr tablet Take 10 mg by mouth at bedtime.   metFORMIN (GLUCOPHAGE-XR) 500 MG 24 hr tablet Take 1,000 mg by mouth 2 (two) times daily.   ONETOUCH VERIO test strip 1 each daily.   rosuvastatin (CRESTOR) 10 MG tablet Take 1 tablet (10 mg total) by mouth every other day.   No facility-administered encounter medications on file as of 12/10/2022.  :   Review of Systems:  Out of a complete 14 point review of systems, all are reviewed and negative with the exception of these symptoms as listed below:  Review of Systems  Neurological:        Pt here for falls Pt states 2 falls in last month Pt fell and hit head hospital admission required Pt states tracy turner manages his sleep apnea     Objective:  Neurological Exam  Physical Exam Physical Examination:   On orthostatic testing, he has a 21 point drop in SBP and 19 point drop in DBP.   General Examination: The patient is a very pleasant 77 y.o. male in no acute distress. He appears well-developed and well-nourished and well groomed.   HEENT: Normocephalic, atraumatic, pupils are equal, round and reactive to light, extraocular tracking is good without limitation to gaze excursion or nystagmus noted. Hearing is grossly intact. Face is symmetric with normal facial animation. Speech is clear with no dysarthria noted. There is no hypophonia. There is no lip, neck/head, jaw or voice tremor. Neck is supple with full range of passive and  active motion. There are no carotid bruits on auscultation. Oropharynx  exam reveals: mild mouth dryness, adequate dental hygiene and mild airway crowding. Tongue protrudes centrally and palate elevates symmetrically.   Chest: Clear to auscultation without wheezing, rhonchi or crackles noted.  Heart: S1+S2+0, regular and normal without murmurs, rubs or gallops noted.   Abdomen: Soft, non-tender and non-distended.  Extremities: There is no pitting edema in the distal lower extremities bilaterally.   Skin: Warm and dry without trophic changes noted.   Musculoskeletal: exam reveals no obvious joint deformities.   Neurologically:  Mental status: The patient is awake, alert and oriented in all 4 spheres. His immediate and remote memory, attention, language skills and fund of knowledge are appropriate. There is no evidence of aphasia, agnosia, apraxia or anomia. Speech is clear with normal prosody and enunciation. Thought process is linear. Mood is normal and affect is normal.  Cranial nerves II - XII are as described above under HEENT exam.  Motor exam: Normal bulk, strength and tone is noted. There is no obvious action or resting tremor, review exception of a very slight and intermittent resting tremor in the right hand area, mostly in the thumb. Fine motor skills and coordination: Mildly impaired globally with finger taps and hand movements as well as foot taps.  No lateralization.    Cerebellar testing: No dysmetria or intention tremor. There is no truncal or gait ataxia.  Sensory exam: intact to light touch in the upper and lower extremities.  Gait, station and balance: He stands slowly and pushes himself up, posture is mildly stooped, increased lumbar kyphosis.  He has a single-point cane, he can walk without the cane, he walks slowly and cautiously, slightly wider base, mild limp on the left.  Preserved arm swing noted.   Assessment and Plan:  In summary, SEMAJE LOCHER is a very pleasant 77 y.o.-year old male with an underlying complex medical history of  coronary artery disease with status post CABG in 2013, ischemic cardiomyopathy, left ventricular dysfunction, right bundle branch block, congestive heart failure, status post biventricular ICD placement in November 2023, chronic A-fib with status post cardioversion in 2020, also in April 2024, pending cardioversion this month, peripheral artery disease with status post peripheral vascular balloon angioplasty, left foot fifth ray amputation, osteomyelitis, BPH, diabetes, hypertension, spinal stenosis of the lumbar region, history of stroke, history of sleep apnea, and overweight state, history of subdural hematoma in the context of fall with head injury in May 2024, who presents for evaluation of his gait disorder and recurrent falls twice in May 2024.  History and examination are concerning for an accidental fall and multifactorial gait disorder.  He does not have any telltale signs of Parkinson's disease, history not suggestive of stroke and workup in the recent past not indicative of a stroke or seizure.  I talked to the patient and his daughter at length, contributing factors leading to the falls include:  Having had a recent fall with injury,  Degenerative arthritis of the lower back, Peripheral artery disease and neuropathy,  Medication side effects from Gabapentin and/or Amiodarone,  Blood pressure drop with standing (ie orthostatic hypotension).   I do not believe he needs another head CT at this time.  He does have significant orthostatic hypotension.  He is advised to follow-up with his cardiologist as planned.  He is advised to consider follow-up with outpatient neurosurgery especially if surgical risk with regards to his cardiac ablation needs to be discussed.   I recommend we  monitor his exam.  After a successful cardiac ablation he may be able to come off amiodarone.  He is advised to talk to you about potentially lowering his gabapentin dose.  He is advised to stay well-hydrated and stand up  slowly, start using his walker more consistently rather than just a cane.  He is cautioned with regards to his driving. He is advised to make an appointment with his cardiologist who manages his sleep apnea, to make sure his settings are correct. We will follow-up in approximately 3 months, sooner if needed.  This was an extended visit of over 70 minutes with copious record review involved and discussing multiple issues.  I answered all their questions today and the patient and his daughter were in agreement.  Thank you very much for allowing me to participate in the care of this nice patient. If I can be of any further assistance to you please do not hesitate to call me at [phone].  Sincerely,   [name] Foley, MD, PhD

## 2022-12-10 NOTE — Patient Instructions (Addendum)
I believe you have a multifactorial gait disorder, and higher fall risk due to a combination of factors. These factors include:   Having had a recent fall with injury,  Degenerative arthritis of the lower back, Peripheral artery disease and neuropathy,  Medication side effects from Gabapentin and/or Amiodarone,  Blood pressure drop with standing (ie orthostatic hypotension),  Please remember to stand up slowly and get your bearings first turn slowly, no bending down to pick anything, no heavy lifting, be extra careful at night and first thing in the morning. Also, be careful in the Bathroom and the kitchen.   Remember to drink plenty of fluid, eat healthy meals and do not skip any meals. Try to eat protein with a every meal and eat a healthy snack such as fruit or nuts or yogurt in between meals. Try to keep a regular sleep-wake schedule and try to exercise daily.   I recommend you use your walker at all times.   Please be cautious with your driving.   As far as your medications are concerned, I would like to suggest no new medications.   As far as diagnostic testing: I do not think you need another head CT scan at this moment.   I recommend you follow with Dr. Mayford Knife, if you have not seen her yearly for your sleep apnea check up to make sure your settings on the machine are good.   Follow up in 3 months.

## 2022-12-11 DIAGNOSIS — W19XXXA Unspecified fall, initial encounter: Secondary | ICD-10-CM | POA: Diagnosis not present

## 2022-12-11 DIAGNOSIS — H6120 Impacted cerumen, unspecified ear: Secondary | ICD-10-CM | POA: Diagnosis not present

## 2022-12-11 DIAGNOSIS — S0101XA Laceration without foreign body of scalp, initial encounter: Secondary | ICD-10-CM | POA: Diagnosis not present

## 2022-12-11 DIAGNOSIS — S065XAA Traumatic subdural hemorrhage with loss of consciousness status unknown, initial encounter: Secondary | ICD-10-CM | POA: Diagnosis not present

## 2022-12-11 DIAGNOSIS — I482 Chronic atrial fibrillation, unspecified: Secondary | ICD-10-CM | POA: Diagnosis not present

## 2022-12-11 DIAGNOSIS — E1122 Type 2 diabetes mellitus with diabetic chronic kidney disease: Secondary | ICD-10-CM | POA: Diagnosis not present

## 2022-12-11 DIAGNOSIS — R2689 Other abnormalities of gait and mobility: Secondary | ICD-10-CM | POA: Diagnosis not present

## 2022-12-11 DIAGNOSIS — I5022 Chronic systolic (congestive) heart failure: Secondary | ICD-10-CM | POA: Diagnosis not present

## 2022-12-15 NOTE — Progress Notes (Signed)
Spoke to pt and instructed him to come at 1100 for his procedure. Instructed him to be npo after midnight and to take medication in the morning with a small sip of water. Instructed to have a ride home and someone to stay with them for 24 hours. Confirmed that they have had no breaks in their [facility].

## 2022-12-16 ENCOUNTER — Encounter (HOSPITAL_COMMUNITY): Payer: Self-pay | Admitting: Cardiology

## 2022-12-16 ENCOUNTER — Ambulatory Visit (HOSPITAL_COMMUNITY): Payer: Medicare Other | Admitting: Certified Registered Nurse Anesthetist

## 2022-12-16 ENCOUNTER — Ambulatory Visit (HOSPITAL_BASED_OUTPATIENT_CLINIC_OR_DEPARTMENT_OTHER): Payer: Medicare Other | Admitting: Certified Registered Nurse Anesthetist

## 2022-12-16 ENCOUNTER — Encounter (HOSPITAL_COMMUNITY)
Admission: RE | Disposition: A | Discharge status: Home / Self Care | Payer: Self-pay | Source: Home / Self Care | Attending: Cardiology

## 2022-12-16 ENCOUNTER — Other Ambulatory Visit: Payer: Self-pay

## 2022-12-16 ENCOUNTER — Telehealth: Payer: Self-pay | Admitting: Cardiology

## 2022-12-16 ENCOUNTER — Ambulatory Visit (HOSPITAL_COMMUNITY)
Admission: RE | Admit: 2022-12-16 | Discharge: 2022-12-16 | Disposition: A | Discharge status: Home / Self Care | Payer: Medicare Other | Attending: Cardiology | Admitting: Cardiology

## 2022-12-16 DIAGNOSIS — I509 Heart failure, unspecified: Secondary | ICD-10-CM | POA: Diagnosis not present

## 2022-12-16 DIAGNOSIS — I2510 Atherosclerotic heart disease of native coronary artery without angina pectoris: Secondary | ICD-10-CM

## 2022-12-16 DIAGNOSIS — Z951 Presence of aortocoronary bypass graft: Secondary | ICD-10-CM | POA: Diagnosis not present

## 2022-12-16 DIAGNOSIS — I5022 Chronic systolic (congestive) heart failure: Secondary | ICD-10-CM | POA: Diagnosis not present

## 2022-12-16 DIAGNOSIS — E119 Type 2 diabetes mellitus without complications: Secondary | ICD-10-CM | POA: Diagnosis not present

## 2022-12-16 DIAGNOSIS — Z87891 Personal history of nicotine dependence: Secondary | ICD-10-CM | POA: Diagnosis not present

## 2022-12-16 DIAGNOSIS — Z7901 Long term (current) use of anticoagulants: Secondary | ICD-10-CM | POA: Diagnosis not present

## 2022-12-16 DIAGNOSIS — Z95810 Presence of automatic (implantable) cardiac defibrillator: Secondary | ICD-10-CM | POA: Diagnosis not present

## 2022-12-16 DIAGNOSIS — I4819 Other persistent atrial fibrillation: Secondary | ICD-10-CM | POA: Diagnosis not present

## 2022-12-16 DIAGNOSIS — I4891 Unspecified atrial fibrillation: Secondary | ICD-10-CM | POA: Diagnosis not present

## 2022-12-16 DIAGNOSIS — I110 Hypertensive heart disease with heart failure: Secondary | ICD-10-CM | POA: Diagnosis not present

## 2022-12-16 DIAGNOSIS — D6869 Other thrombophilia: Secondary | ICD-10-CM | POA: Insufficient documentation

## 2022-12-16 DIAGNOSIS — G4733 Obstructive sleep apnea (adult) (pediatric): Secondary | ICD-10-CM

## 2022-12-16 DIAGNOSIS — I4811 Longstanding persistent atrial fibrillation: Secondary | ICD-10-CM

## 2022-12-16 DIAGNOSIS — Z7984 Long term (current) use of oral hypoglycemic drugs: Secondary | ICD-10-CM | POA: Diagnosis not present

## 2022-12-16 DIAGNOSIS — I10 Essential (primary) hypertension: Secondary | ICD-10-CM

## 2022-12-16 DIAGNOSIS — E785 Hyperlipidemia, unspecified: Secondary | ICD-10-CM | POA: Diagnosis not present

## 2022-12-16 DIAGNOSIS — E1151 Type 2 diabetes mellitus with diabetic peripheral angiopathy without gangrene: Secondary | ICD-10-CM | POA: Diagnosis not present

## 2022-12-16 HISTORY — PX: CARDIOVERSION: SHX1299

## 2022-12-16 LAB — GLUCOSE, CAPILLARY: Glucose-Capillary: 81 mg/dL (ref 70–99)

## 2022-12-16 SURGERY — CARDIOVERSION
Anesthesia: Monitor Anesthesia Care

## 2022-12-16 MED ORDER — PROPOFOL 10 MG/ML IV BOLUS
INTRAVENOUS | Status: DC | PRN
  Administered 2022-12-16: 50 mg via INTRAVENOUS

## 2022-12-16 MED ORDER — SODIUM CHLORIDE 0.9 % IV SOLN: INTRAVENOUS | Status: DC

## 2022-12-16 MED ORDER — LIDOCAINE 2% (20 MG/ML) 5 ML SYRINGE
INTRAMUSCULAR | Status: DC | PRN
  Administered 2022-12-16: 60 mg via INTRAVENOUS

## 2022-12-16 SURGICAL SUPPLY — 1 items: ELECT DEFIB PAD ADLT CADENCE (PAD) ×1 IMPLANT

## 2022-12-16 NOTE — CV Procedure (Signed)
Procedure:   DCCV  Indication:  Symptomatic atrial fibrillation  Procedure Note:  The patient signed informed consent.  They have had had therapeutic anticoagulation with Eliquis greater than 3 weeks.  Anesthesia was administered by Dr. Maple Hudson.  Adequate airway was maintained throughout and vital followed per protocol.  They were cardioverted x 1 with 200J of biphasic synchronized energy.  They converted to A-paced, BiV paced rhythm, confirmed with device interrogation.  There were no apparent complications.  The patient had normal neuro status and respiratory status post procedure with vitals stable as recorded elsewhere.    Follow up:  They will continue on current medical therapy and follow up with cardiology as scheduled.  Epifanio Lesches, MD 12/16/2022 12:25 PM

## 2022-12-16 NOTE — Discharge Instructions (Signed)

## 2022-12-16 NOTE — Telephone Encounter (Signed)
Patient's daughter called stating she think his cpap machine needs to be adjusted, she states he has lost weight and is having trouble sleeping at night.

## 2022-12-16 NOTE — Transfer of Care (Signed)
Immediate Anesthesia Transfer of Care Note  Patient: [name] [name]  Procedure(s) Performed: CARDIOVERSION  Patient Location: Cath Lab  Anesthesia Type:MAC  Level of Consciousness: awake, alert , and oriented  Airway & Oxygen Therapy: Patient Spontanous Breathing and Patient connected to nasal cannula oxygen  Post-op Assessment: Report given to RN and Post -op Vital signs reviewed and stable  Post vital signs: Reviewed and stable  Last Vitals:  Vitals Value Taken Time  BP 132/77   Temp 98   Pulse 80   Resp 16   SpO2 99     Last Pain:  Vitals:   12/16/22 1103  TempSrc: Temporal         Complications: No notable events documented.

## 2022-12-17 ENCOUNTER — Encounter: Payer: Self-pay | Admitting: Internal Medicine

## 2022-12-17 ENCOUNTER — Encounter (HOSPITAL_COMMUNITY): Payer: Self-pay | Admitting: Cardiology

## 2022-12-17 DIAGNOSIS — R634 Abnormal weight loss: Secondary | ICD-10-CM | POA: Diagnosis not present

## 2022-12-17 DIAGNOSIS — L309 Dermatitis, unspecified: Secondary | ICD-10-CM | POA: Diagnosis not present

## 2022-12-17 DIAGNOSIS — Z6827 Body mass index (BMI) 27.0-27.9, adult: Secondary | ICD-10-CM | POA: Diagnosis not present

## 2022-12-17 DIAGNOSIS — R2689 Other abnormalities of gait and mobility: Secondary | ICD-10-CM | POA: Diagnosis not present

## 2022-12-17 DIAGNOSIS — I959 Hypotension, unspecified: Secondary | ICD-10-CM | POA: Diagnosis not present

## 2022-12-17 DIAGNOSIS — R030 Elevated blood-pressure reading, without diagnosis of hypertension: Secondary | ICD-10-CM | POA: Diagnosis not present

## 2022-12-17 NOTE — Interval H&P Note (Signed)
History and Physical Interval Note:  12/17/2022 9:00 AM  [name]  has presented today for surgery, with the diagnosis of AFIB.  The various methods of treatment have been discussed with the patient and family. After consideration of risks, benefits and other options for treatment, the patient has consented to  Procedure(s): CARDIOVERSION (N/A) as a surgical intervention.  The patient's history has been reviewed, patient examined, no change in status, stable for surgery.  I have reviewed the patient's chart and labs.  Questions were answered to the patient's satisfaction.     [name] [name]

## 2022-12-21 ENCOUNTER — Ambulatory Visit: Payer: Medicare Other | Attending: Cardiology

## 2022-12-21 DIAGNOSIS — I5022 Chronic systolic (congestive) heart failure: Secondary | ICD-10-CM

## 2022-12-21 DIAGNOSIS — Z95810 Presence of automatic (implantable) cardiac defibrillator: Secondary | ICD-10-CM

## 2022-12-21 NOTE — Anesthesia Postprocedure Evaluation (Signed)
Anesthesia Post Note  Patient: [name] [name]  Procedure(s) Performed: CARDIOVERSION     Patient location during evaluation: Cath Lab Anesthesia Type: General Level of consciousness: awake and alert Pain management: pain level controlled Vital Signs Assessment: post-procedure vital signs reviewed and stable Respiratory status: spontaneous breathing, nonlabored ventilation and respiratory function stable Cardiovascular status: stable Postop Assessment: no apparent nausea or vomiting Anesthetic complications: no   No notable events documented.  Last Vitals:  Vitals:   12/16/22 1230 12/16/22 1240  BP: (!) 133/59 137/66  Pulse: (!) 51 (!) 53  Resp: 17 17  Temp:    SpO2: 94% 96%    Last Pain:  Vitals:   12/16/22 1219  TempSrc: Temporal  PainSc: 0-No pain                 Uldine Fuster

## 2022-12-21 NOTE — Anesthesia Preprocedure Evaluation (Signed)
Anesthesia Evaluation  Patient identified by MRN, date of birth, ID band Patient awake    Reviewed: Allergy & Precautions, NPO status , Patient's Chart, lab work & pertinent test results  History of Anesthesia Complications Negative for: history of anesthetic complications  Airway Mallampati: III  TM Distance: >3 FB Neck ROM: Full    Dental  (+) Dental Advisory Given   Pulmonary shortness of breath, sleep apnea , former smoker   breath sounds clear to auscultation       Cardiovascular hypertension, + CAD, + Peripheral Vascular Disease and +CHF  + dysrhythmias  Rhythm:Irregular     Neuro/Psych CVA    GI/Hepatic   Endo/Other  diabetes    Renal/GU      Musculoskeletal   Abdominal   Peds  Hematology   Anesthesia Other Findings   Reproductive/Obstetrics                             Anesthesia Physical Anesthesia Plan  ASA: 3  Anesthesia Plan: General   Post-op Pain Management: Minimal or no pain anticipated   Induction: Intravenous  PONV Risk Score and Plan: 2 and Treatment may vary due to age or medical condition  Airway Management Planned: Mask and Nasal Cannula  Additional Equipment: None  Intra-op Plan:   Post-operative Plan:   Informed Consent: I have reviewed the patients History and Physical, chart, labs and discussed the procedure including the risks, benefits and alternatives for the proposed anesthesia with the patient or authorized representative who has indicated his/her understanding and acceptance.     Dental advisory given  Plan Discussed with: CRNA  Anesthesia Plan Comments:        Anesthesia Quick Evaluation

## 2022-12-22 NOTE — Progress Notes (Unsigned)
EPIC Encounter for ICM Monitoring  Patient Name: [name] [name] is a 77 y.o. male Date: 12/22/2022 Primary Care Physican: [name], MD Primary Cardiologist: [facility]        Electrophysiologist: [name] Bi-V Pacing: 92.8%         09/21/2022 Weight: 213 lbs 10/06/2022 Weight: 219 lbs  11/17/2022 Weight: 212.5 lbs 12/22/2022 Weight: 211 lbs   Clinical Status Since 16-Dec-2022 Time in AT/AF   <0.1 hr/day (0.2%) Longest AT/AF  57 days                                                            Spoke with patient and heart failure questions reviewed.  Transmission results reviewed.  Pt reports tiredness and no energy. His BP was running low and PCP had him hold 2nd Furosemide dosage for a few days.  Per Pt, Dr [facility] office recommended that he only hold Furosemide if BP is <120. He does not feel like he has any fluid accumulation at this time.     Optivol thoracic impedance suggesting possible fluid accumulation starting 6/15.    Prescribed:  Furosemide 40 mg take 1 tablet(s) (40 mg total) by mouth every morning and at bedtime.   Labs: 12/02/2022 Creatinine 1.20, BUN 17, Potassium 4.0, Sodium 137, GFR >60  11/11/2022 Creatinine 1.18, BUN 20, Potassium 3.6, Sodium 134, GFR >60  11/10/2022 Creatinine 1.25, BUN 21, Potassium 3.8, Sodium 139, GFR 59  11/09/2022 Creatinine 1.39, BUN 26, Potassium 3.4, Sodium 136, GFR 52 10/12/2022 Creatinine 1.04, BUN 17, Potassium 4.8, Sodium 144  10/02/2022 Creatinine 1.13, BUN 19, Potassium 4.3, Sodium 141, GFR 67  09/07/2022 Creatinine 0.98, BUN 20, Potassium 4.2, Sodium 144, GFR 79  A complete set of results can be found in Results Review.   Recommendations:  Advised to follow Dr Blanchie Dessert advice regarding taking Furosemide and to limit salt/fluid intake.     Follow-up plan: ICM clinic phone appointment on 12/28/2022 to recheck fluid levels.   91 day device clinic remote transmission 02/01/2023.     EP/Cardiology Office Visits: 02/26/2023 with Dr. [name].      Copy of ICM check sent to Dr. [name].    3 month ICM trend: 12/21/2022.    12-14 Month ICM trend:     Karie Soda, RN 12/22/2022 4:17 PM

## 2022-12-23 ENCOUNTER — Ambulatory Visit (HOSPITAL_COMMUNITY)
Admission: RE | Admit: 2022-12-23 | Discharge: 2022-12-23 | Disposition: A | Discharge status: Home / Self Care | Payer: Medicare Other | Source: Ambulatory Visit | Attending: Internal Medicine | Admitting: Internal Medicine

## 2022-12-23 ENCOUNTER — Encounter (HOSPITAL_BASED_OUTPATIENT_CLINIC_OR_DEPARTMENT_OTHER): Payer: Self-pay | Admitting: Cardiology

## 2022-12-23 ENCOUNTER — Encounter (HOSPITAL_COMMUNITY): Payer: Self-pay | Admitting: Internal Medicine

## 2022-12-23 VITALS — BP 122/52 | HR 50 | Ht 74.0 in | Wt 213.0 lb

## 2022-12-23 DIAGNOSIS — I4811 Longstanding persistent atrial fibrillation: Secondary | ICD-10-CM | POA: Diagnosis not present

## 2022-12-23 DIAGNOSIS — Z79899 Other long term (current) drug therapy: Secondary | ICD-10-CM | POA: Insufficient documentation

## 2022-12-23 DIAGNOSIS — I2510 Atherosclerotic heart disease of native coronary artery without angina pectoris: Secondary | ICD-10-CM | POA: Diagnosis not present

## 2022-12-23 DIAGNOSIS — Z7984 Long term (current) use of oral hypoglycemic drugs: Secondary | ICD-10-CM | POA: Insufficient documentation

## 2022-12-23 DIAGNOSIS — Z951 Presence of aortocoronary bypass graft: Secondary | ICD-10-CM | POA: Insufficient documentation

## 2022-12-23 DIAGNOSIS — G4733 Obstructive sleep apnea (adult) (pediatric): Secondary | ICD-10-CM | POA: Diagnosis not present

## 2022-12-23 DIAGNOSIS — E119 Type 2 diabetes mellitus without complications: Secondary | ICD-10-CM | POA: Diagnosis not present

## 2022-12-23 DIAGNOSIS — Z7901 Long term (current) use of anticoagulants: Secondary | ICD-10-CM | POA: Insufficient documentation

## 2022-12-23 DIAGNOSIS — Z8249 Family history of ischemic heart disease and other diseases of the circulatory system: Secondary | ICD-10-CM | POA: Insufficient documentation

## 2022-12-23 DIAGNOSIS — Z95810 Presence of automatic (implantable) cardiac defibrillator: Secondary | ICD-10-CM | POA: Diagnosis not present

## 2022-12-23 DIAGNOSIS — I4891 Unspecified atrial fibrillation: Secondary | ICD-10-CM | POA: Diagnosis not present

## 2022-12-23 DIAGNOSIS — I5020 Unspecified systolic (congestive) heart failure: Secondary | ICD-10-CM | POA: Diagnosis not present

## 2022-12-23 DIAGNOSIS — E785 Hyperlipidemia, unspecified: Secondary | ICD-10-CM | POA: Diagnosis not present

## 2022-12-23 DIAGNOSIS — D6869 Other thrombophilia: Secondary | ICD-10-CM | POA: Insufficient documentation

## 2022-12-23 DIAGNOSIS — I110 Hypertensive heart disease with heart failure: Secondary | ICD-10-CM | POA: Insufficient documentation

## 2022-12-23 DIAGNOSIS — I4819 Other persistent atrial fibrillation: Secondary | ICD-10-CM | POA: Diagnosis not present

## 2022-12-23 NOTE — Progress Notes (Signed)
Primary Care Physician: [name], MD Primary Cardiologist: Dr. [name] Primary Electrophysiologist: Dr. [name] Referring Physician: Dr. Jackquline Berlin [name] [name] is a 77 y.o. male with a history of CAD s/p CABG, OSA, HFrEF, s/p Medtronic CRT-D implanted 04/30/22, T2DM, HTN, HLD, and persistent atrial fibrillation who presents for consultation in the [facility]. Patient underwent successful cardioversion on 10/16/22 x 2 shocks but unfortunately had ERAF. Seen by Dr. [name] on 10/27/22 and started on amiodarone as a bridge to ablation (scheduled for 01/19/23) with plan to repeat DCCV. He was admitted due to fall 5/13-15/24 with evidence of subdural hematoma on CT imaging. Eliquis held and plan per neurosurgery to resume in 1 month. He fell again on 5/18 and plan amended to hold Eliquis for one more week and to resume ~5/25. Patient is on Eliquis 5 mg BID (temporarily on hold due to subdural hematoma) for a CHADS2VASC score of 6.  On evaluation today, he appears to be in atrial flutter vs afib with slow ventricular response. He feels tired when out of rhythm. He restarted Eliquis 5 mg BID on 5/28 with 2 full doses. He feels well and now ambulates with a walker around the house to prevent additional falls. He is scheduled to see Neurology after scheduled ablation; family tried to move it sooner but unable to do so. Overall, he states he feels well and wishes to proceed with ablation as scheduled.   On follow up 12/23/22, he is currently in NSR. S/p successful DCCV on 12/16/22. No falls since cardioversion. He does note feeling as thought he has very little energy, despite being in normal rhythm. He specifically states he had more energy when he was loading on amiodarone. No weight gain noted over past few days. No SOB or leg swelling. He currently takes amiodarone 200 mg daily.   Today, he denies symptoms of palpitations, chest pain, orthopnea, PND, dizziness, presyncope,  syncope, snoring, daytime somnolence, bleeding, or neurologic sequela. The patient is tolerating medications without difficulties and is otherwise without complaint today.    he has a BMI of Body mass index is 27.35 kg/m.Marland Kitchen Filed Weights   12/23/22 1052  Weight: 96.6 kg     Family History  Problem Relation Age of Onset   Cancer Mother    Lung disease Father        & heart disease   Colon polyps Father    Colon cancer Sister    Sudden death Maternal Grandmother    Anesthesia problems Daughter     Atrial Fibrillation Management history:  Previous antiarrhythmic drugs: amiodarone Previous cardioversions: 08/2013, 04/2019, 10/16/22, 12/16/22 Previous ablations: None Anticoagulation history: Eliquis 5 mg BID    Past Medical History:  Diagnosis Date   Arthritis    Knee L - probably   Atrial fibrillation (HCC)    BPH (benign prostatic hyperplasia)    CAD (coronary artery disease) 07/06/2011   CHF (congestive heart failure) (HCC)    Coronary artery disease    Diabetes mellitus    Frequency of urination    Heart failure (HCC)    High cholesterol    History of nuclear stress test 09/2010   dipyridamole; mild perfusion defect due to attenuation with mild superimposed ischemia in apical septal, apical, apical inferior & apical lateral regions; rest LV enlarged in size; prominent gut uptake in infero-apical region; no significant ischemia demonstrated; low risk scan    HNP (herniated nucleus pulposus), lumbar    Hypertension  Ischemic cardiomyopathy    LV dysfunction 07/06/2011   Nocturia    Right bundle branch block    S/P CABG (coronary artery bypass graft) 08/03/2011   x3; LIMA to LAD,, SVG to PDA, SVG to posterolateral branch of RCA; Dr. Wayland Salinas   Sleep apnea    sleep study 10/2010- AHI during total sleep 32.1/hr and during REM 62.3/hr (severe sleep apnea)unable to tolerate c pap   Spinal stenosis of lumbar region    Stroke [facility])    [place]    Past Surgical  History:  Procedure Laterality Date   ABDOMINAL AORTOGRAM W/LOWER EXTREMITY Left 11/14/2020   Procedure: ABDOMINAL AORTOGRAM W/LOWER EXTREMITY;  Surgeon: Cephus Shelling, MD;  Location: [facility] INVASIVE CV LAB;  Service: Cardiovascular;  Laterality: Left;   ABDOMINAL AORTOGRAM W/LOWER EXTREMITY N/A 01/02/2021   Procedure: ABDOMINAL AORTOGRAM W/LOWER EXTREMITY;  Surgeon: Cephus Shelling, MD;  Location: [facility] INVASIVE CV LAB;  Service: Cardiovascular;  Laterality: N/A;   AMPUTATION Left 03/20/2021   Procedure: Left foot 5th ray amputation;  Surgeon: Toni Arthurs, MD;  Location: [facility] OR;  Service: Orthopedics;  Laterality: Left;   BIV ICD INSERTION CRT-D N/A 04/30/2022   Procedure: BIV ICD INSERTION CRT-D;  Surgeon: [name], MD;  Location: [facility] INVASIVE CV LAB;  Service: Cardiovascular;  Laterality: N/A;   CARDIAC CATHETERIZATION  01/2011   ischemic cardiomyopathy 30-35%, multivessel CAD (Dr. Bishop Limbo)    CARDIAC CATHETERIZATION  09/13/2015   Procedure: Right/Left Heart Cath and Coronary/Graft Angiography;  Surgeon: [name], MD;  Location: [facility] INVASIVE CV LAB;  Service: Cardiovascular;;   CARDIOVERSION N/A 05/16/2019   Procedure: CARDIOVERSION;  Surgeon: [name], MD;  Location: [facility] ENDOSCOPY;  Service: Cardiovascular;  Laterality: N/A;   CARDIOVERSION N/A 10/16/2022   Procedure: CARDIOVERSION;  Surgeon: Thurmon Fair, MD;  Location: [facility] INVASIVE CV LAB;  Service: Cardiovascular;  Laterality: N/A;   CARDIOVERSION N/A 12/16/2022   Procedure: CARDIOVERSION;  Surgeon: Little Ishikawa, MD;  Location: [facility] INVASIVE CV LAB;  Service: Cardiovascular;  Laterality: N/A;   Colonscopy     CORONARY ARTERY BYPASS GRAFT  08/03/2011   Procedure: CORONARY ARTERY BYPASS GRAFTING (CABG);  Surgeon: Alleen Borne, MD;  Location: [facility] OR;  Service: Open Heart Surgery;  Laterality: N/A;  CABG times three using right saphenous vein and left mammary artery usisng endoscope.   LEFT HEART CATH AND  CORS/GRAFTS ANGIOGRAPHY N/A 12/15/2021   Procedure: LEFT HEART CATH AND CORS/GRAFTS ANGIOGRAPHY;  Surgeon: Tonny Bollman, MD;  Location: [facility] INVASIVE CV LAB;  Service: Cardiovascular;  Laterality: N/A;   LEFT HEART CATHETERIZATION WITH CORONARY/GRAFT ANGIOGRAM N/A 09/20/2013   Procedure: LEFT HEART CATHETERIZATION WITH Isabel Caprice;  Surgeon: [name], MD;  Location: [facility] CATH LAB;  Service: Cardiovascular;  Laterality: N/A;   Lower Extremity Arterial Doppler  03/16/2013   bilat ABIs demonstated normal values; R runoff - posterior tibial & anterior tibial arteries occluded; L runoff - peroneal & posterior tibial arteries occluded, anterior tibial artery appears occluded   LUMBAR LAMINECTOMY/DECOMPRESSION MICRODISCECTOMY N/A 09/12/2014   Procedure: LUMBAR DECOMPRESSION L3-L4, L4-L5, MICRODISCECTOMY L4-L5;  Surgeon: Jene Every, MD;  Location: [facility] ORS;  Service: Orthopedics;  Laterality: N/A;   PERIPHERAL VASCULAR BALLOON ANGIOPLASTY  11/14/2020   Procedure: PERIPHERAL VASCULAR BALLOON ANGIOPLASTY;  Surgeon: Cephus Shelling, MD;  Location: [facility] INVASIVE CV LAB;  Service: Cardiovascular;;   PERIPHERAL VASCULAR BALLOON ANGIOPLASTY Left 01/02/2021   Procedure: PERIPHERAL VASCULAR BALLOON ANGIOPLASTY;  Surgeon: Cephus Shelling, MD;  Location:  [facility] INVASIVE CV LAB;  Service: Cardiovascular;  Laterality: Left;  Failed PTA   Pilonidal Cyst removed     TRANSTHORACIC ECHOCARDIOGRAM  11/10/2012   EF 40-45%, mild LVH, mild hypokinesis of anteroseptal myocardium, grade 1 diastolic dysfunction; mild MR & calcifed mitral annulus; LA mild-mod dilated; RA mildly dilated    Current Outpatient Medications  Medication Sig Dispense Refill   acetaminophen (TYLENOL) 500 MG tablet Take 500-1,000 mg by mouth every 6 (six) hours as needed (for pain.).     amiodarone (PACERONE) 200 MG tablet Take 2 tablets (400 mg total) TWICE a day for 2 weeks, then take 1 tablet (200 mg total) TWICE a day for 2 weeks,  then take 1 tablet (200 mg total) ONCE a day thereafter (Patient taking differently: Take 200 mg by mouth at bedtime.) 90 tablet 2   apixaban (ELIQUIS) 5 MG TABS tablet Take 5 mg by mouth 2 (two) times daily.     carvedilol (COREG) 3.125 MG tablet Take 1 tablet (3.125 mg total) by mouth 2 (two) times daily with a meal. 180 tablet 3   empagliflozin (JARDIANCE) 25 MG TABS tablet Take 1 tablet (25 mg total) by mouth daily. 90 tablet 3   furosemide (LASIX) 40 MG tablet Take 1 1/2 tablet by mouth in the morning and 1 tablet by mouth in the afternoon for 4 days then go back down to 1 tablet by mouth twice a day (Patient taking differently: Take 40 mg by mouth 2 (two) times daily. Morning, and at [facility]) 180 tablet 3   gabapentin (NEURONTIN) 300 MG capsule Take 600 mg by mouth at bedtime.     glipiZIDE (GLUCOTROL XL) 10 MG 24 hr tablet Take 10 mg by mouth at bedtime.     metFORMIN (GLUCOPHAGE-XR) 500 MG 24 hr tablet Take 1,000 mg by mouth 2 (two) times daily.     ONETOUCH VERIO test strip 1 each daily.     rosuvastatin (CRESTOR) 10 MG tablet Take 1 tablet (10 mg total) by mouth every other day. 50 tablet 3   No current facility-administered medications for this encounter.    Allergies  Allergen Reactions   Entresto [Sacubitril-Valsartan] Other (See Comments)    dizziness   Sacubitril Other (See Comments)     Dizziness    ROS- All systems are reviewed and negative except as per the HPI above.  Physical Exam: Vitals:   12/23/22 1052  BP: (!) 122/52  Pulse: (!) 50  Weight: 96.6 kg  Height: 6\' 2"  (1.88 m)    GEN- The patient is well appearing, alert and oriented x 3 today.   Head- normocephalic, atraumatic Eyes-  Sclera clear, conjunctiva pink Ears- hearing intact Lungs- Clear to ausculation bilaterally, normal work of breathing Heart- Regular rate and rhythm, no murmurs, rubs or gallops, PMI not laterally displaced Extremities- no clubbing, cyanosis, or edema MS- no significant deformity  or atrophy Skin- no rash or lesion Psych- euthymic mood, full affect Neuro- strength and sensation are intact   Wt Readings from Last 3 Encounters:  12/23/22 96.6 kg  12/16/22 96.6 kg  12/10/22 93.9 kg    EKG today demonstrates  AV dual-paced rhythm HR 50 PR 164 ms QRS 156 ms QT/Qtc 568/517 ms  Echo 11/10/22 demonstrated:  1. Left ventricular ejection fraction, by estimation, is 25 to 30%. The  left ventricle has severely decreased function. The left ventricle  demonstrates global hypokinesis. The left ventricular internal cavity size  was mildly to moderately dilated. Left  ventricular  diastolic parameters are indeterminate. Elevated left  ventricular end-diastolic pressure.   2. Right ventricular systolic function is moderately reduced. The right  ventricular size is moderately enlarged.   3. The mitral valve is normal in structure. Moderate mitral valve  regurgitation. No evidence of mitral stenosis.   4. Tricuspid valve regurgitation is mild to moderate.   5. The aortic valve is tricuspid. Aortic valve regurgitation is not  visualized. No aortic stenosis is present.   6. The inferior vena cava is normal in size with greater than 50%  respiratory variability, suggesting right atrial pressure of 3 mmHg.  Epic records are reviewed at length today.  CHA2DS2-VASc Score = 6  The patient's score is based upon: CHF History: 1 HTN History: 1 Diabetes History: 1 Stroke History: 0 Vascular Disease History: 1 Age Score: 2 Gender Score: 0       ASSESSMENT AND PLAN: Longstanding persistent Atrial Fibrillation (ICD10:  I48.19) The patient's CHA2DS2-VASc score is 6, indicating a 9.7% annual risk of stroke.   S/p successful DCCV on 12/16/22.   He is currently in NSR. Continue amiodarone 200 mg daily.  We discussed his recent stable anemia, normal TSH, and lack of symptoms for acute heart failure exacerbation which would lead to possible symptom of low energy. Unsure if solely  related to his low EF, or if now post amiodarone load feeling adverse effect of amiodarone. We discussed lowering his dose of amiodarone to 100 mg daily but he declines at this time due to fear of going back out of rhythm. For now, he will continue with amiodarone 200 mg daily and f/u as scheduled for CT prior to ablation.  Secondary Hypercoagulable State (ICD10:  D68.69) The patient is at significant risk for stroke/thromboembolism based upon his CHA2DS2-VASc Score of 6.  Continue Apixaban (Eliquis).  No missed doses since 5/28.    F/u as scheduled for CT imaging prior to ablation.    [name], PA-C [facility] [address] [place], [place] [address] [phone] 12/23/2022 11:41 AM

## 2022-12-24 ENCOUNTER — Encounter (HOSPITAL_COMMUNITY): Payer: Self-pay

## 2022-12-25 ENCOUNTER — Telehealth: Payer: Self-pay

## 2022-12-25 NOTE — Telephone Encounter (Signed)
Received call from patient and he asked if 7/1 appointment is a home remote or OV.  Advised will be home remote transmission that will come during the night and he does not need to do anything regarding the appointment.  He appreciated the information.

## 2022-12-25 NOTE — Telephone Encounter (Signed)
Order placed to [employer] for a mask fit via community message.

## 2022-12-28 ENCOUNTER — Ambulatory Visit: Payer: Medicare Other | Attending: Cardiology

## 2022-12-28 DIAGNOSIS — Z95810 Presence of automatic (implantable) cardiac defibrillator: Secondary | ICD-10-CM

## 2022-12-28 DIAGNOSIS — I5022 Chronic systolic (congestive) heart failure: Secondary | ICD-10-CM

## 2022-12-28 NOTE — Progress Notes (Signed)
EPIC Encounter for ICM Monitoring  Patient Name: [name] [name] is a 77 y.o. male Date: 12/28/2022 Primary Care Physican: [name], MD Primary Cardiologist: [facility]        Electrophysiologist: [name] Bi-V Pacing: 98.7%         09/21/2022 Weight: 213 lbs 10/06/2022 Weight: 219 lbs  11/17/2022 Weight: 212.5 lbs 12/22/2022 Weight: 211 lbs   Clinical Status Since 21-Dec-2022 Time in AT/AF  0.0 hr/day (0.0%)                                                          Spoke with patient and heart failure questions reviewed.  Transmission results reviewed.  Pt reports he is feeling okay today and does not have any fluid symptoms.    Optivol thoracic impedance suggesting possible fluid accumulation starting 6/15 and trending back toward baseline.    Prescribed:  Furosemide 40 mg take 1 tablet(s) (40 mg total) by mouth every morning and at bedtime.  Dr [name] advised to hold Furosemide is BP is <120   Labs: 12/02/2022 Creatinine 1.20, BUN 17, Potassium 4.0, Sodium 137, GFR >60  11/11/2022 Creatinine 1.18, BUN 20, Potassium 3.6, Sodium 134, GFR >60  11/10/2022 Creatinine 1.25, BUN 21, Potassium 3.8, Sodium 139, GFR 59  11/09/2022 Creatinine 1.39, BUN 26, Potassium 3.4, Sodium 136, GFR 52 10/12/2022 Creatinine 1.04, BUN 17, Potassium 4.8, Sodium 144  10/02/2022 Creatinine 1.13, BUN 19, Potassium 4.3, Sodium 141, GFR 67  09/07/2022 Creatinine 0.98, BUN 20, Potassium 4.2, Sodium 144, GFR 79  A complete set of results can be found in Results Review.   Recommendations:  Recommendation to limit salt and fluid intake.  Encouraged to call if experiencing any fluid symptoms.    Follow-up plan: ICM clinic phone appointment on 01/11/2023 to recheck fluid levels.   91 day device clinic remote transmission 02/01/2023.     EP/Cardiology Office Visits: 02/26/2023 with Dr. [name].     Copy of ICM check sent to Dr. [name].    3 month ICM trend: 12/28/2022.    12-14 Month ICM trend:     Karie Soda,  RN 12/28/2022 1:28 PM

## 2023-01-04 DIAGNOSIS — X32XXXD Exposure to sunlight, subsequent encounter: Secondary | ICD-10-CM | POA: Diagnosis not present

## 2023-01-04 DIAGNOSIS — Z85828 Personal history of other malignant neoplasm of skin: Secondary | ICD-10-CM | POA: Diagnosis not present

## 2023-01-04 DIAGNOSIS — L570 Actinic keratosis: Secondary | ICD-10-CM | POA: Diagnosis not present

## 2023-01-04 DIAGNOSIS — C44319 Basal cell carcinoma of skin of other parts of face: Secondary | ICD-10-CM | POA: Diagnosis not present

## 2023-01-04 DIAGNOSIS — L820 Inflamed seborrheic keratosis: Secondary | ICD-10-CM | POA: Diagnosis not present

## 2023-01-04 DIAGNOSIS — Z01812 Encounter for preprocedural laboratory examination: Secondary | ICD-10-CM | POA: Diagnosis not present

## 2023-01-04 DIAGNOSIS — I480 Paroxysmal atrial fibrillation: Secondary | ICD-10-CM | POA: Diagnosis not present

## 2023-01-04 DIAGNOSIS — L821 Other seborrheic keratosis: Secondary | ICD-10-CM | POA: Diagnosis not present

## 2023-01-04 DIAGNOSIS — Z08 Encounter for follow-up examination after completed treatment for malignant neoplasm: Secondary | ICD-10-CM | POA: Diagnosis not present

## 2023-01-05 LAB — CBC
Hematocrit: 38.3 % (ref 37.5–51.0)
Hemoglobin: 12.1 g/dL — ABNORMAL LOW (ref 13.0–17.7)
MCH: 27.6 pg (ref 26.6–33.0)
MCHC: 31.6 g/dL (ref 31.5–35.7)
MCV: 87 fL (ref 79–97)
Platelets: 232 10*3/uL (ref 150–450)
RBC: 4.39 x10E6/uL (ref 4.14–5.80)
RDW: 15.5 % — ABNORMAL HIGH (ref 11.6–15.4)
WBC: 5.9 10*3/uL (ref 3.4–10.8)

## 2023-01-05 LAB — BASIC METABOLIC PANEL
BUN/Creatinine Ratio: 17 (ref 10–24)
BUN: 22 mg/dL (ref 8–27)
CO2: 25 mmol/L (ref 20–29)
Calcium: 9.1 mg/dL (ref 8.6–10.2)
Chloride: 101 mmol/L (ref 96–106)
Creatinine, Ser: 1.27 mg/dL (ref 0.76–1.27)
Glucose: 196 mg/dL — ABNORMAL HIGH (ref 70–99)
Potassium: 4.2 mmol/L (ref 3.5–5.2)
Sodium: 142 mmol/L (ref 134–144)
eGFR: 58 mL/min/{1.73_m2} — ABNORMAL LOW (ref >59)

## 2023-01-06 DIAGNOSIS — R262 Difficulty in walking, not elsewhere classified: Secondary | ICD-10-CM | POA: Diagnosis not present

## 2023-01-06 DIAGNOSIS — M6281 Muscle weakness (generalized): Secondary | ICD-10-CM | POA: Diagnosis not present

## 2023-01-06 DIAGNOSIS — S065X0S Traumatic subdural hemorrhage without loss of consciousness, sequela: Secondary | ICD-10-CM | POA: Diagnosis not present

## 2023-01-06 DIAGNOSIS — R2681 Unsteadiness on feet: Secondary | ICD-10-CM | POA: Diagnosis not present

## 2023-01-07 DIAGNOSIS — E1122 Type 2 diabetes mellitus with diabetic chronic kidney disease: Secondary | ICD-10-CM | POA: Diagnosis not present

## 2023-01-07 DIAGNOSIS — D638 Anemia in other chronic diseases classified elsewhere: Secondary | ICD-10-CM | POA: Diagnosis not present

## 2023-01-07 DIAGNOSIS — E7849 Other hyperlipidemia: Secondary | ICD-10-CM | POA: Diagnosis not present

## 2023-01-07 DIAGNOSIS — E7801 Familial hypercholesterolemia: Secondary | ICD-10-CM | POA: Diagnosis not present

## 2023-01-11 ENCOUNTER — Telehealth: Payer: Self-pay | Admitting: Cardiology

## 2023-01-11 ENCOUNTER — Ambulatory Visit: Payer: Medicare Other | Attending: Cardiology

## 2023-01-11 DIAGNOSIS — I5022 Chronic systolic (congestive) heart failure: Secondary | ICD-10-CM

## 2023-01-11 DIAGNOSIS — N401 Enlarged prostate with lower urinary tract symptoms: Secondary | ICD-10-CM | POA: Diagnosis not present

## 2023-01-11 DIAGNOSIS — K5901 Slow transit constipation: Secondary | ICD-10-CM | POA: Diagnosis not present

## 2023-01-11 DIAGNOSIS — I739 Peripheral vascular disease, unspecified: Secondary | ICD-10-CM | POA: Diagnosis not present

## 2023-01-11 DIAGNOSIS — F5221 Male erectile disorder: Secondary | ICD-10-CM | POA: Diagnosis not present

## 2023-01-11 DIAGNOSIS — I255 Ischemic cardiomyopathy: Secondary | ICD-10-CM | POA: Diagnosis not present

## 2023-01-11 DIAGNOSIS — Z95810 Presence of automatic (implantable) cardiac defibrillator: Secondary | ICD-10-CM

## 2023-01-11 DIAGNOSIS — D638 Anemia in other chronic diseases classified elsewhere: Secondary | ICD-10-CM | POA: Diagnosis not present

## 2023-01-11 DIAGNOSIS — I5033 Acute on chronic diastolic (congestive) heart failure: Secondary | ICD-10-CM | POA: Diagnosis not present

## 2023-01-11 DIAGNOSIS — E1165 Type 2 diabetes mellitus with hyperglycemia: Secondary | ICD-10-CM | POA: Diagnosis not present

## 2023-01-11 DIAGNOSIS — E1140 Type 2 diabetes mellitus with diabetic neuropathy, unspecified: Secondary | ICD-10-CM | POA: Diagnosis not present

## 2023-01-11 DIAGNOSIS — E1122 Type 2 diabetes mellitus with diabetic chronic kidney disease: Secondary | ICD-10-CM | POA: Diagnosis not present

## 2023-01-11 DIAGNOSIS — I10 Essential (primary) hypertension: Secondary | ICD-10-CM | POA: Diagnosis not present

## 2023-01-11 DIAGNOSIS — F411 Generalized anxiety disorder: Secondary | ICD-10-CM | POA: Diagnosis not present

## 2023-01-11 NOTE — Telephone Encounter (Signed)
Daughter states she is following-up on patient's request for mask fitting and CPAP equipment.  Daughter stated she was referred to a DME company but has not heard from them.  Daughter wants further information about the DME company to follow-up.

## 2023-01-12 ENCOUNTER — Encounter: Payer: Self-pay | Admitting: Cardiology

## 2023-01-12 ENCOUNTER — Ambulatory Visit (HOSPITAL_COMMUNITY)
Admission: RE | Admit: 2023-01-12 | Discharge: 2023-01-12 | Disposition: A | Discharge status: Home / Self Care | Payer: Medicare Other | Source: Ambulatory Visit | Attending: Cardiology | Admitting: Cardiology

## 2023-01-12 DIAGNOSIS — Z01812 Encounter for preprocedural laboratory examination: Secondary | ICD-10-CM | POA: Insufficient documentation

## 2023-01-12 DIAGNOSIS — I700 Atherosclerosis of aorta: Secondary | ICD-10-CM | POA: Diagnosis not present

## 2023-01-12 DIAGNOSIS — I4819 Other persistent atrial fibrillation: Secondary | ICD-10-CM | POA: Insufficient documentation

## 2023-01-12 DIAGNOSIS — I517 Cardiomegaly: Secondary | ICD-10-CM | POA: Diagnosis not present

## 2023-01-12 MED ORDER — IOHEXOL 350 MG/ML SOLN
95.0000 mL | Freq: Once | INTRAVENOUS | Status: AC | PRN
  Administered 2023-01-12: 95 mL via INTRAVENOUS

## 2023-01-13 NOTE — Progress Notes (Signed)
EPIC Encounter for ICM Monitoring  Patient Name: [name] [name] is a 77 y.o. male Date: 01/13/2023 Primary Care Physican: [name], MD Primary Cardiologist: [facility]        Electrophysiologist: [name] Bi-V Pacing: 99.7%         09/21/2022 Weight: 213 lbs 10/06/2022 Weight: 219 lbs  11/17/2022 Weight: 212.5 lbs 12/22/2022 Weight: 211 lbs 01/13/2023 Weight: 209 lbs   Clinical Status Since 21-Dec-2022 Time in AT/AF  0.0 hr/day (0.0%)                                                          Spoke with patient and heart failure questions reviewed.  Transmission results reviewed.  Pt reports he is feeling okay today and does not have any fluid symptoms. Afib Ablation scheduled 7/23.   Optivol thoracic impedance suggesting fluid levels returned to normal.    Prescribed:  Furosemide 40 mg take 1 tablet(s) (40 mg total) by mouth every morning and at bedtime.  Dr [name] advised to hold Furosemide if BP is <120   Labs: 01/05/2023 Creatinine 1.27, BUN 22, Potassium 4.2, Sodium 142, GFR 58 12/02/2022 Creatinine 1.20, BUN 17, Potassium 4.0, Sodium 137, GFR >60  11/11/2022 Creatinine 1.18, BUN 20, Potassium 3.6, Sodium 134, GFR >60  11/10/2022 Creatinine 1.25, BUN 21, Potassium 3.8, Sodium 139, GFR 59  11/09/2022 Creatinine 1.39, BUN 26, Potassium 3.4, Sodium 136, GFR 52 10/12/2022 Creatinine 1.04, BUN 17, Potassium 4.8, Sodium 144  10/02/2022 Creatinine 1.13, BUN 19, Potassium 4.3, Sodium 141, GFR 67  09/07/2022 Creatinine 0.98, BUN 20, Potassium 4.2, Sodium 144, GFR 79  A complete set of results can be found in Results Review.   Recommendations:  Recommendation to limit salt and fluid intake.  Encouraged to call if experiencing any fluid symptoms.    Follow-up plan: ICM clinic phone appointment on 02/02/2023.   91 day device clinic remote transmission 02/01/2023.     EP/Cardiology Office Visits: 02/26/2023 with Dr. [name].     Copy of ICM check sent to Dr. [name].     3 month ICM trend:  01/11/2023.    12-14 Month ICM trend:     Karie Soda, RN 01/13/2023 10:52 AM

## 2023-01-14 DIAGNOSIS — R262 Difficulty in walking, not elsewhere classified: Secondary | ICD-10-CM | POA: Diagnosis not present

## 2023-01-14 DIAGNOSIS — S065X0S Traumatic subdural hemorrhage without loss of consciousness, sequela: Secondary | ICD-10-CM | POA: Diagnosis not present

## 2023-01-14 DIAGNOSIS — M6281 Muscle weakness (generalized): Secondary | ICD-10-CM | POA: Diagnosis not present

## 2023-01-14 DIAGNOSIS — R2681 Unsteadiness on feet: Secondary | ICD-10-CM | POA: Diagnosis not present

## 2023-01-14 NOTE — Telephone Encounter (Signed)
I have resent this order to [employer] for processing on today. I apologize for the wait.

## 2023-01-18 NOTE — Pre-Procedure Instructions (Signed)
Instructed patient on the following items: Arrival time 0515 Nothing to eat or drink after midnight No meds AM of procedure Responsible person to drive you home and stay with you for 24 hrs  Have you missed any doses of anti-coagulant Eliquis- takes twice a day, hasn't missed any doses.  Don't take in the morning.

## 2023-01-19 ENCOUNTER — Ambulatory Visit (HOSPITAL_COMMUNITY): Payer: Medicare Other | Admitting: Anesthesiology

## 2023-01-19 ENCOUNTER — Ambulatory Visit (HOSPITAL_BASED_OUTPATIENT_CLINIC_OR_DEPARTMENT_OTHER): Payer: Medicare Other | Admitting: Anesthesiology

## 2023-01-19 ENCOUNTER — Ambulatory Visit (HOSPITAL_COMMUNITY)
Admission: RE | Admit: 2023-01-19 | Discharge: 2023-01-19 | Disposition: A | Discharge status: Home / Self Care | Payer: Medicare Other | Attending: Cardiology | Admitting: Cardiology

## 2023-01-19 ENCOUNTER — Encounter (HOSPITAL_COMMUNITY)
Admission: RE | Disposition: A | Discharge status: Home / Self Care | Payer: Medicare Other | Source: Home / Self Care | Attending: Cardiology

## 2023-01-19 DIAGNOSIS — E119 Type 2 diabetes mellitus without complications: Secondary | ICD-10-CM | POA: Diagnosis not present

## 2023-01-19 DIAGNOSIS — I255 Ischemic cardiomyopathy: Secondary | ICD-10-CM | POA: Insufficient documentation

## 2023-01-19 DIAGNOSIS — I2510 Atherosclerotic heart disease of native coronary artery without angina pectoris: Secondary | ICD-10-CM | POA: Insufficient documentation

## 2023-01-19 DIAGNOSIS — Z87891 Personal history of nicotine dependence: Secondary | ICD-10-CM | POA: Insufficient documentation

## 2023-01-19 DIAGNOSIS — I4891 Unspecified atrial fibrillation: Secondary | ICD-10-CM

## 2023-01-19 DIAGNOSIS — E7800 Pure hypercholesterolemia, unspecified: Secondary | ICD-10-CM | POA: Insufficient documentation

## 2023-01-19 DIAGNOSIS — G4730 Sleep apnea, unspecified: Secondary | ICD-10-CM | POA: Insufficient documentation

## 2023-01-19 DIAGNOSIS — Z951 Presence of aortocoronary bypass graft: Secondary | ICD-10-CM | POA: Diagnosis not present

## 2023-01-19 DIAGNOSIS — I110 Hypertensive heart disease with heart failure: Secondary | ICD-10-CM | POA: Insufficient documentation

## 2023-01-19 DIAGNOSIS — I483 Typical atrial flutter: Secondary | ICD-10-CM | POA: Diagnosis not present

## 2023-01-19 DIAGNOSIS — I4819 Other persistent atrial fibrillation: Secondary | ICD-10-CM | POA: Diagnosis not present

## 2023-01-19 DIAGNOSIS — I5023 Acute on chronic systolic (congestive) heart failure: Secondary | ICD-10-CM

## 2023-01-19 DIAGNOSIS — I5022 Chronic systolic (congestive) heart failure: Secondary | ICD-10-CM | POA: Insufficient documentation

## 2023-01-19 DIAGNOSIS — Z95810 Presence of automatic (implantable) cardiac defibrillator: Secondary | ICD-10-CM | POA: Diagnosis not present

## 2023-01-19 DIAGNOSIS — G4733 Obstructive sleep apnea (adult) (pediatric): Secondary | ICD-10-CM | POA: Diagnosis not present

## 2023-01-19 HISTORY — PX: ATRIAL FIBRILLATION ABLATION: EP1191

## 2023-01-19 LAB — GLUCOSE, CAPILLARY
Glucose-Capillary: 136 mg/dL — ABNORMAL HIGH (ref 70–99)
Glucose-Capillary: 161 mg/dL — ABNORMAL HIGH (ref 70–99)

## 2023-01-19 LAB — POCT ACTIVATED CLOTTING TIME
Activated Clotting Time: 281 seconds
Activated Clotting Time: 293 seconds

## 2023-01-19 SURGERY — ATRIAL FIBRILLATION ABLATION
Anesthesia: General

## 2023-01-19 MED ORDER — SUGAMMADEX SODIUM 200 MG/2ML IV SOLN
INTRAVENOUS | Status: DC | PRN
  Administered 2023-01-19: 180 mg via INTRAVENOUS

## 2023-01-19 MED ORDER — SODIUM CHLORIDE 0.9 % IV SOLN: INTRAVENOUS | Status: DC

## 2023-01-19 MED ORDER — LIDOCAINE 2% (20 MG/ML) 5 ML SYRINGE
INTRAMUSCULAR | Status: DC | PRN
  Administered 2023-01-19: 40 mg via INTRAVENOUS

## 2023-01-19 MED ORDER — SODIUM CHLORIDE 0.9% FLUSH: 3.0000 mL | INTRAVENOUS | Status: DC | PRN

## 2023-01-19 MED ORDER — ACETAMINOPHEN 325 MG PO TABS: 650.0000 mg | ORAL_TABLET | ORAL | Status: DC | PRN

## 2023-01-19 MED ORDER — SODIUM CHLORIDE 0.9 % IV SOLN: 250.0000 mL | INTRAVENOUS | Status: DC | PRN

## 2023-01-19 MED ORDER — HEPARIN (PORCINE) IN NACL 1000-0.9 UT/500ML-% IV SOLN
INTRAVENOUS | Status: DC | PRN
  Administered 2023-01-19 (×4): 500 mL

## 2023-01-19 MED ORDER — FENTANYL CITRATE (PF) 100 MCG/2ML IJ SOLN
INTRAMUSCULAR | Status: AC
  Filled 2023-01-19: qty 2

## 2023-01-19 MED ORDER — HEPARIN SODIUM (PORCINE) 1000 UNIT/ML IJ SOLN
INTRAMUSCULAR | Status: AC
  Filled 2023-01-19: qty 10

## 2023-01-19 MED ORDER — PROPOFOL 10 MG/ML IV BOLUS
INTRAVENOUS | Status: DC | PRN
Start: 2023-01-19 — End: 2023-01-19
  Administered 2023-01-19 (×4): 40 mg via INTRAVENOUS

## 2023-01-19 MED ORDER — HEPARIN SODIUM (PORCINE) 1000 UNIT/ML IJ SOLN
INTRAMUSCULAR | Status: DC | PRN
  Administered 2023-01-19: 3000 [IU] via INTRAVENOUS
  Administered 2023-01-19: 14000 [IU] via INTRAVENOUS
  Administered 2023-01-19: 5000 [IU] via INTRAVENOUS

## 2023-01-19 MED ORDER — ONDANSETRON HCL 4 MG/2ML IJ SOLN
INTRAMUSCULAR | Status: DC | PRN
  Administered 2023-01-19: 4 mg via INTRAVENOUS

## 2023-01-19 MED ORDER — CEFAZOLIN SODIUM-DEXTROSE 2-4 GM/100ML-% IV SOLN
INTRAVENOUS | Status: AC
  Filled 2023-01-19: qty 100

## 2023-01-19 MED ORDER — CEFAZOLIN SODIUM-DEXTROSE 2-3 GM-%(50ML) IV SOLR
INTRAVENOUS | Status: DC | PRN
  Administered 2023-01-19: 2 g via INTRAVENOUS

## 2023-01-19 MED ORDER — FENTANYL CITRATE (PF) 250 MCG/5ML IJ SOLN
INTRAMUSCULAR | Status: DC | PRN
  Administered 2023-01-19: 100 ug via INTRAVENOUS

## 2023-01-19 MED ORDER — PHENYLEPHRINE 80 MCG/ML (10ML) SYRINGE FOR IV PUSH (FOR BLOOD PRESSURE SUPPORT)
PREFILLED_SYRINGE | INTRAVENOUS | Status: DC | PRN
  Administered 2023-01-19: 80 ug via INTRAVENOUS

## 2023-01-19 MED ORDER — DEXAMETHASONE SODIUM PHOSPHATE 10 MG/ML IJ SOLN
INTRAMUSCULAR | Status: DC | PRN
  Administered 2023-01-19: 4 mg via INTRAVENOUS

## 2023-01-19 MED ORDER — HEPARIN SODIUM (PORCINE) 1000 UNIT/ML IJ SOLN
INTRAMUSCULAR | Status: DC | PRN
  Administered 2023-01-19: 1000 [IU] via INTRAVENOUS

## 2023-01-19 MED ORDER — ROCURONIUM BROMIDE 10 MG/ML (PF) SYRINGE
PREFILLED_SYRINGE | INTRAVENOUS | Status: DC | PRN
  Administered 2023-01-19: 70 mg via INTRAVENOUS

## 2023-01-19 MED ORDER — PHENYLEPHRINE HCL-NACL 20-0.9 MG/250ML-% IV SOLN
INTRAVENOUS | Status: AC
  Filled 2023-01-19: qty 750

## 2023-01-19 MED ORDER — PROTAMINE SULFATE 10 MG/ML IV SOLN
INTRAVENOUS | Status: DC | PRN
  Administered 2023-01-19: 40 mg via INTRAVENOUS

## 2023-01-19 MED ORDER — ONDANSETRON HCL 4 MG/2ML IJ SOLN: 4.0000 mg | Freq: Four times a day (QID) | INTRAMUSCULAR | Status: DC | PRN

## 2023-01-19 SURGICAL SUPPLY — 19 items
BAG SNAP BAND KOVER 36X36 (MISCELLANEOUS) IMPLANT
BLANKET WARM UNDERBOD FULL ACC (MISCELLANEOUS) ×1 IMPLANT
CATH ABLAT QDOT MICRO BI TC DF (CATHETERS) IMPLANT
CATH OCTARAY 2.0 F 3-3-3-3-3 (CATHETERS) IMPLANT
CATH S-M CIRCA TEMP PROBE (CATHETERS) IMPLANT
CATH SOUNDSTAR ECO 8FR (CATHETERS) IMPLANT
CLOSURE MYNX CONTROL 6F/7F (Vascular Products) IMPLANT
CLOSURE PERCLOSE PROSTYLE (VASCULAR PRODUCTS) IMPLANT
COVER SWIFTLINK CONNECTOR (BAG) ×1 IMPLANT
PACK EP LATEX FREE (CUSTOM PROCEDURE TRAY) ×1
PACK EP LF (CUSTOM PROCEDURE TRAY) ×1 IMPLANT
PAD DEFIB RADIO PHYSIO CONN (PAD) ×1 IMPLANT
PATCH CARTO3 (PAD) IMPLANT
SHEATH CARTO VIZIGO SM CVD (SHEATH) IMPLANT
SHEATH PINNACLE 7F 10CM (SHEATH) IMPLANT
SHEATH PINNACLE 8F 10CM (SHEATH) IMPLANT
SHEATH PINNACLE 9F 10CM (SHEATH) IMPLANT
SHEATH PROBE COVER 6X72 (BAG) IMPLANT
TUBING SMART ABLATE COOLFLOW (TUBING) IMPLANT

## 2023-01-19 NOTE — Progress Notes (Signed)
Renee, PA to bedside

## 2023-01-19 NOTE — Progress Notes (Addendum)
Upon arrival to [facility] stay, [facility], reddish drainage noted to right groin, no hematoma noted, slow ooze noted, drsg removed and manual pressure applied by this RN for approximately 20 minutes, then manual pressure taken over by Tinnie Gens, RN, [place], PA from EP attempted to be notified, no return answer at this time, see flowsheets for VS

## 2023-01-19 NOTE — Discharge Instructions (Signed)

## 2023-01-19 NOTE — Transfer of Care (Signed)
Immediate Anesthesia Transfer of Care Note  Patient: [name] [name]  Procedure(s) Performed: ATRIAL FIBRILLATION ABLATION  Patient Location: Cath Lab  Anesthesia Type:General  Level of Consciousness: awake, alert , and oriented  Airway & Oxygen Therapy: Patient Spontanous Breathing and Patient connected to nasal cannula oxygen  Post-op Assessment: Report given to RN and Post -op Vital signs reviewed and stable  Post vital signs: Reviewed and stable  Last Vitals:  Vitals Value Taken Time  BP 129/64 01/19/23 0952  Temp    Pulse 51 01/19/23 0957  Resp 14 01/19/23 0957  SpO2 99 % 01/19/23 0957  Vitals shown include unfiled device data.  Last Pain:  Vitals:   01/19/23 0556  TempSrc:   PainSc: 0-No pain         Complications: There were no known notable events for this encounter.

## 2023-01-19 NOTE — H&P (Signed)
Electrophysiology Office Note   Date:  01/19/2023   ID:  [name] [name], [name] [name] 07-01-1945, MRN [id number]  PCP:  [name], MD  Cardiologist:  [name] Primary Electrophysiologist:  Clarisa Danser Jorja Loa, MD    Chief Complaint: CHF   History of Present Illness: [name] [name] is a 77 y.o. male who is being seen today for the evaluation of CHF at the request of No ref. provider found. Presenting today for electrophysiology evaluation.  He has a history significant for coronary artery disease post CABG, chronic systolic heart failure, hypertension, diabetes, sleep apnea, hyperlipidemia.  Cardiac MRI showed thinning and akinesis of the inferior wall but no scar.  He is post Medtronic CRT-D implanted 04/30/2022.  He has developed atrial fibrillation.  He had a cardioversion but unfortunately did go back into atrial fibrillation.  Today, denies symptoms of palpitations, chest pain, shortness of breath, orthopnea, PND, lower extremity edema, claudication, dizziness, presyncope, syncope, bleeding, or neurologic sequela. The patient is tolerating medications without difficulties. Plan AF ablation today.    Past Medical History:  Diagnosis Date   Arthritis    Knee L - probably   Atrial fibrillation (HCC)    BPH (benign prostatic hyperplasia)    CAD (coronary artery disease) 07/06/2011   CHF (congestive heart failure) (HCC)    Coronary artery disease    Diabetes mellitus    Frequency of urination    Heart failure (HCC)    High cholesterol    History of nuclear stress test 09/2010   dipyridamole; mild perfusion defect due to attenuation with mild superimposed ischemia in apical septal, apical, apical inferior & apical lateral regions; rest LV enlarged in size; prominent gut uptake in infero-apical region; no significant ischemia demonstrated; low risk scan    HNP (herniated nucleus pulposus), lumbar    Hypertension    Ischemic cardiomyopathy    LV dysfunction 07/06/2011   Nocturia     Right bundle branch block    S/P CABG (coronary artery bypass graft) 08/03/2011   x3; LIMA to LAD,, SVG to PDA, SVG to posterolateral branch of RCA; Dr. Wayland Salinas   Sleep apnea    sleep study 10/2010- AHI during total sleep 32.1/hr and during REM 62.3/hr (severe sleep apnea)unable to tolerate c pap   Spinal stenosis of lumbar region    Stroke [facility])    [place]    Past Surgical History:  Procedure Laterality Date   ABDOMINAL AORTOGRAM W/LOWER EXTREMITY Left 11/14/2020   Procedure: ABDOMINAL AORTOGRAM W/LOWER EXTREMITY;  Surgeon: Cephus Shelling, MD;  Location: [facility] INVASIVE CV LAB;  Service: Cardiovascular;  Laterality: Left;   ABDOMINAL AORTOGRAM W/LOWER EXTREMITY N/A 01/02/2021   Procedure: ABDOMINAL AORTOGRAM W/LOWER EXTREMITY;  Surgeon: Cephus Shelling, MD;  Location: [facility] INVASIVE CV LAB;  Service: Cardiovascular;  Laterality: N/A;   AMPUTATION Left 03/20/2021   Procedure: Left foot 5th ray amputation;  Surgeon: Toni Arthurs, MD;  Location: [facility] OR;  Service: Orthopedics;  Laterality: Left;   BIV ICD INSERTION CRT-D N/A 04/30/2022   Procedure: BIV ICD INSERTION CRT-D;  Surgeon: [name], MD;  Location: [facility] INVASIVE CV LAB;  Service: Cardiovascular;  Laterality: N/A;   CARDIAC CATHETERIZATION  01/2011   ischemic cardiomyopathy 30-35%, multivessel CAD (Dr. Bishop Limbo)    CARDIAC CATHETERIZATION  09/13/2015   Procedure: Right/Left Heart Cath and Coronary/Graft Angiography;  Surgeon: [name], MD;  Location: [facility] INVASIVE CV LAB;  Service: Cardiovascular;;   CARDIOVERSION N/A 05/16/2019   Procedure: CARDIOVERSION;  Surgeon: [name], MD;  Location: [facility] ENDOSCOPY;  Service: Cardiovascular;  Laterality: N/A;   CARDIOVERSION N/A 10/16/2022   Procedure: CARDIOVERSION;  Surgeon: Thurmon Fair, MD;  Location: [facility] INVASIVE CV LAB;  Service: Cardiovascular;  Laterality: N/A;   CARDIOVERSION N/A 12/16/2022   Procedure: CARDIOVERSION;  Surgeon: Little Ishikawa, MD;   Location: [facility] INVASIVE CV LAB;  Service: Cardiovascular;  Laterality: N/A;   Colonscopy     CORONARY ARTERY BYPASS GRAFT  08/03/2011   Procedure: CORONARY ARTERY BYPASS GRAFTING (CABG);  Surgeon: Alleen Borne, MD;  Location: [facility] OR;  Service: Open Heart Surgery;  Laterality: N/A;  CABG times three using right saphenous vein and left mammary artery usisng endoscope.   LEFT HEART CATH AND CORS/GRAFTS ANGIOGRAPHY N/A 12/15/2021   Procedure: LEFT HEART CATH AND CORS/GRAFTS ANGIOGRAPHY;  Surgeon: Tonny Bollman, MD;  Location: [facility] INVASIVE CV LAB;  Service: Cardiovascular;  Laterality: N/A;   LEFT HEART CATHETERIZATION WITH CORONARY/GRAFT ANGIOGRAM N/A 09/20/2013   Procedure: LEFT HEART CATHETERIZATION WITH Isabel Caprice;  Surgeon: [name], MD;  Location: [facility] CATH LAB;  Service: Cardiovascular;  Laterality: N/A;   Lower Extremity Arterial Doppler  03/16/2013   bilat ABIs demonstated normal values; R runoff - posterior tibial & anterior tibial arteries occluded; L runoff - peroneal & posterior tibial arteries occluded, anterior tibial artery appears occluded   LUMBAR LAMINECTOMY/DECOMPRESSION MICRODISCECTOMY N/A 09/12/2014   Procedure: LUMBAR DECOMPRESSION L3-L4, L4-L5, MICRODISCECTOMY L4-L5;  Surgeon: Jene Every, MD;  Location: [facility] ORS;  Service: Orthopedics;  Laterality: N/A;   PERIPHERAL VASCULAR BALLOON ANGIOPLASTY  11/14/2020   Procedure: PERIPHERAL VASCULAR BALLOON ANGIOPLASTY;  Surgeon: Cephus Shelling, MD;  Location: [facility] INVASIVE CV LAB;  Service: Cardiovascular;;   PERIPHERAL VASCULAR BALLOON ANGIOPLASTY Left 01/02/2021   Procedure: PERIPHERAL VASCULAR BALLOON ANGIOPLASTY;  Surgeon: Cephus Shelling, MD;  Location: [facility] INVASIVE CV LAB;  Service: Cardiovascular;  Laterality: Left;  Failed PTA   Pilonidal Cyst removed     TRANSTHORACIC ECHOCARDIOGRAM  11/10/2012   EF 40-45%, mild LVH, mild hypokinesis of anteroseptal myocardium, grade 1 diastolic dysfunction; mild MR & calcifed  mitral annulus; LA mild-mod dilated; RA mildly dilated     Current Facility-Administered Medications  Medication Dose Route Frequency Provider Last Rate Last Admin   0.9 %  sodium chloride infusion   Intravenous Continuous [name], MD 50 mL/hr at 01/19/23 9562 Continued from Pre-op at 01/19/23 1308    Allergies:   Entresto [sacubitril-valsartan] and Sacubitril   Social History:  The patient  reports that he quit smoking about 11 years ago. His smoking use included cigars. He quit smokeless tobacco use about 10 years ago. He reports that he does not currently use alcohol after a past usage of about 1.0 - 2.0 standard drink of alcohol per week. He reports that he does not use drugs.   Family History:  The patient's family history includes Anesthesia problems in his daughter; Cancer in his mother; Colon cancer in his sister; Colon polyps in his father; Lung disease in his father; Sudden death in his maternal grandmother.   ROS:  Please see the history of present illness.   Otherwise, review of systems is positive for none.   All other systems are reviewed and negative.   PHYSICAL EXAM: VS:  BP 131/60   Pulse (!) 52   Temp 99.3 F (37.4 C) (Temporal)   Resp 18   Ht 6\' 2"  (1.88 m)   Wt 94.8 kg   SpO2 99%  BMI 26.83 kg/m  , BMI Body mass index is 26.83 kg/m. GEN: Well nourished, well developed, in no acute distress  HEENT: normal  Neck: no JVD, carotid bruits, or masses Cardiac: RRR; no murmurs, rubs, or gallops,no edema  Respiratory:  clear to auscultation bilaterally, normal work of breathing GI: soft, nontender, nondistended, + BS MS: no deformity or atrophy  Skin: warm and dry Neuro:  Strength and sensation are intact Psych: euthymic mood, full affect  Recent Labs: 11/10/2022: TSH 1.624 11/11/2022: B Natriuretic Peptide 413.9; Magnesium 2.0 01/04/2023: BUN 22; Creatinine, Ser 1.27; Hemoglobin 12.1; Platelets 232; Potassium 4.2; Sodium 142    Lipid Panel      Component Value Date/Time   CHOL 98 (L) 12/10/2021 0832   TRIG 88 12/10/2021 0832   HDL 35 (L) 12/10/2021 0832   CHOLHDL 2.8 12/10/2021 0832   LDLCALC 46 12/10/2021 0832     Wt Readings from Last 3 Encounters:  01/19/23 94.8 kg  12/23/22 96.6 kg  12/16/22 96.6 kg      Other studies Reviewed: Additional studies/ records that were reviewed today include: LHC 12/15/21  Review of the above records today demonstrates:  1.  Severe native coronary artery disease with patency of the left main, total occlusion of the LAD just after the first diagonal, multiple severe calcific stenoses throughout the entirety of the circumflex distribution, and moderate stenoses in the RCA 2.  Status post CABG with continued patency of the LIMA to LAD, and interval occlusion of the SVG to PDA and SVG to PLA 3.  Normal LVEDP  Cardiac MRI 01/20/2022 1. Severe LV dilatation with severe systolic dysfunction (EF 28%). Thinning/akinesis of inferior and lateral walls.   2.  Mild RV dilatation with mild systolic dysfunction (EF 40%)   3. While no late gadolinium enhancement is seen, there is severe thinning (less than 4.52mm) in the apical to basal lateral wall and apical inferior wall, suggesting nonviability in these regions.   4.  Moderate mitral regurgitation (regurgitant fraction 27%)   5.  Moderate tricuspid regurgitation (regurgitant fraction 25%)  ASSESSMENT AND PLAN:  1.  Chronic systolic heart failure: Due to ischemic cardiomyopathy.  Currently on optimal medical therapy per primary cardiology.  Is status post Medtronic CRT-D implanted 06/30/2021.  Sensing, threshold, impedance within normal limits.  2.  Coronary disease: Post CABG.  No current chest pain.  3.  Hypertension: Well-controlled  4.  Hyperlipidemia: Continue statin per primary cardiology  5.  Persistent atrial fibrillation: MUAAZ BRAU has presented today for surgery, with the diagnosis of AF.  The various methods of treatment have  been discussed with the patient and family. After consideration of risks, benefits and other options for treatment, the patient has consented to  Procedure(s): Catheter ablation as a surgical intervention .  Risks include but not limited to complete heart block, stroke, esophageal damage, nerve damage, bleeding, vascular damage, tamponade, perforation, MI, and death. The patient's history has been reviewed, patient examined, no change in status, stable for surgery.  I have reviewed the patient's chart and labs.  Questions were answered to the patient's satisfaction.    Parish Augustine [name], MD 01/19/2023 7:04 AM

## 2023-01-19 NOTE — Anesthesia Preprocedure Evaluation (Signed)
Anesthesia Evaluation  Patient identified by MRN, date of birth, ID band Patient awake    Reviewed: Allergy & Precautions, NPO status , Patient's Chart, lab work & pertinent test results  History of Anesthesia Complications Negative for: history of anesthetic complications  Airway Mallampati: III  TM Distance: >3 FB     Dental  (+) Teeth Intact, Dental Advisory Given   Pulmonary shortness of breath, sleep apnea and Continuous Positive Airway Pressure Ventilation , former smoker   breath sounds clear to auscultation       Cardiovascular hypertension, Pt. on medications and Pt. on home beta blockers (-) angina + CAD, + CABG and +CHF  + dysrhythmias Atrial Fibrillation  Rhythm:Regular  1. Left ventricular ejection fraction, by estimation, is 25 to 30%. The  left ventricle has severely decreased function. The left ventricle  demonstrates global hypokinesis. The left ventricular internal cavity size  was mildly to moderately dilated. Left  ventricular diastolic parameters are indeterminate. Elevated left  ventricular end-diastolic pressure.   2. Right ventricular systolic function is moderately reduced. The right  ventricular size is moderately enlarged.   3. The mitral valve is normal in structure. Moderate mitral valve  regurgitation. No evidence of mitral stenosis.   4. Tricuspid valve regurgitation is mild to moderate.   5. The aortic valve is tricuspid. Aortic valve regurgitation is not  visualized. No aortic stenosis is present.   6. The inferior vena cava is normal in size with greater than 50%  respiratory variability, suggesting right atrial pressure of 3 mmHg.     Neuro/Psych neg Seizures CVA, No Residual Symptoms  negative psych ROS   GI/Hepatic negative GI ROS, Neg liver ROS,,,  Endo/Other  diabetes  Lab Results      Component                Value               Date                      HGBA1C                   7.0  (H)             10/17/2020             Renal/GU Renal InsufficiencyRenal diseaseLab Results      Component                Value               Date                      CREATININE               1.27                01/04/2023                Musculoskeletal  (+) Arthritis ,    Abdominal   Peds  Hematology  (+) Blood dyscrasia Lab Results      Component                Value               Date                      WBC  5.9                 01/04/2023                HGB                      12.1 (L)            01/04/2023                HCT                      38.3                01/04/2023                MCV                      87                  01/04/2023                PLT                      232                 01/04/2023             E;iquis   Anesthesia Other Findings   Reproductive/Obstetrics                             Anesthesia Physical Anesthesia Plan  ASA: 4  Anesthesia Plan: General   Post-op Pain Management: Minimal or no pain anticipated   Induction: Intravenous  PONV Risk Score and Plan: 2 and Ondansetron  Airway Management Planned: Oral ETT  Additional Equipment: None  Intra-op Plan:   Post-operative Plan: Extubation in OR  Informed Consent: I have reviewed the patients History and Physical, chart, labs and discussed the procedure including the risks, benefits and alternatives for the proposed anesthesia with the patient or authorized representative who has indicated his/her understanding and acceptance.     Dental advisory given  Plan Discussed with: CRNA  Anesthesia Plan Comments:        Anesthesia Quick Evaluation

## 2023-01-19 NOTE — Anesthesia Procedure Notes (Signed)
Procedure Name: Intubation Date/Time: 01/19/2023 7:45 AM  Performed by: Cheree Ditto, CRNAPre-anesthesia Checklist: Patient identified, Emergency Drugs available, Suction available and Patient being monitored Patient Re-evaluated:Patient Re-evaluated prior to induction Oxygen Delivery Method: Circle system utilized Preoxygenation: Pre-oxygenation with 100% oxygen Induction Type: IV induction Ventilation: Mask ventilation without difficulty Laryngoscope Size: Mac and 3 Grade View: Grade I Tube type: Oral Tube size: 8.0 mm Number of attempts: 1 Airway Equipment and Method: Stylet and Oral airway Placement Confirmation: ETT inserted through vocal cords under direct vision, positive ETCO2 and breath sounds checked- equal and bilateral Secured at: 24 cm Tube secured with: Tape Dental Injury: Teeth and Oropharynx as per pre-operative assessment

## 2023-01-20 ENCOUNTER — Encounter (HOSPITAL_COMMUNITY): Payer: Self-pay | Admitting: Cardiology

## 2023-01-20 ENCOUNTER — Emergency Department (HOSPITAL_COMMUNITY)
Admission: EM | Admit: 2023-01-20 | Discharge: 2023-01-20 | Disposition: A | Discharge status: Home / Self Care | Payer: Medicare Other | Attending: Emergency Medicine | Admitting: Emergency Medicine

## 2023-01-20 ENCOUNTER — Other Ambulatory Visit: Payer: Self-pay

## 2023-01-20 DIAGNOSIS — L7682 Other postprocedural complications of skin and subcutaneous tissue: Secondary | ICD-10-CM | POA: Diagnosis not present

## 2023-01-20 DIAGNOSIS — Z7901 Long term (current) use of anticoagulants: Secondary | ICD-10-CM | POA: Insufficient documentation

## 2023-01-20 MED ORDER — LIDOCAINE-EPINEPHRINE (PF) 2 %-1:200000 IJ SOLN
INTRAMUSCULAR | Status: AC
  Administered 2023-01-20: 10 mL
  Filled 2023-01-20: qty 20

## 2023-01-20 MED ORDER — LIDOCAINE-EPINEPHRINE (PF) 2 %-1:200000 IJ SOLN: 10.0000 mL | Freq: Once | INTRAMUSCULAR | Status: AC

## 2023-01-20 NOTE — ED Triage Notes (Signed)
Pt had ablasion on the morning of July 23rd. Started blood thinners back and noticed bleeding through the bandage yesterday. Pt arrives with blood dripping down right leg and dressing is saturated. Holding pressure on the site. Pulse present in right foot. Right leg warm.  DP pulse present

## 2023-01-20 NOTE — Progress Notes (Signed)
LATE ENTRY note  Called by short stay RN of recurrent bleeding R groin felt he may need to have lido/epi injection  R groin is soft, no hematoma, excellent pulses, no RLE pain/paraesthesia or pain He has persistent slight oozing from skin/superficial. D/w Dr. [name] advised local infiltration of lidocaine w/epi Area was infiltrated with 9cc resulting hemostasis. 5 min of manual pressure held as well, and redressed. Advised an addition al hour of bed rest be added.  Dr. [name] evaluated groin prior to patient's discharged reported as stable without bleeding  [name] Dowse, PA-C

## 2023-01-20 NOTE — Anesthesia Postprocedure Evaluation (Signed)
Anesthesia Post Note  Patient: [name] [name]  Procedure(s) Performed: ATRIAL FIBRILLATION ABLATION     Patient location during evaluation: Cath Lab Anesthesia Type: General Level of consciousness: awake and alert Pain management: pain level controlled Vital Signs Assessment: post-procedure vital signs reviewed and stable Respiratory status: spontaneous breathing, nonlabored ventilation and respiratory function stable Cardiovascular status: blood pressure returned to baseline and stable Postop Assessment: no apparent nausea or vomiting Anesthetic complications: no   There were no known notable events for this encounter.  Last Vitals:  Vitals:   01/19/23 1400 01/19/23 1434  BP: 126/72   Pulse: 60 60  Resp: 13 14  Temp:    SpO2: 96% 96%    Last Pain:  Vitals:   01/19/23 1102  TempSrc:   PainSc: 0-No pain                 Kaiyah Eber

## 2023-01-20 NOTE — ED Provider Notes (Signed)
[place] EMERGENCY DEPARTMENT AT [facility]  Provider Note  CSN: [id number] Arrival date & time: 01/20/23 8657  History Chief Complaint  Patient presents with   Post-op Problem    Ablasion on the 23rd. Bleeding from right groin    [name] [name] is a 77 y.o. male with history of afib had an ablation done in the morning of 7/23. Post-procedure they had some difficulty with hemostasis of his R groin puncture site and he reports the Cardiology PA injected some lidocaine w/ epi and redressed the wound. He reports when he went to bed there was only a small spot of blood on the gauze but when he woke up a short time prior to arrival, the gauze was soaked and he had dried blood on his leg. He denies any pain. Otherwise he is feeling well. He was started back on his blood thinners post-procedure.    Home Medications Prior to Admission medications   Medication Sig Start Date End Date Taking? Authorizing Provider  acetaminophen (TYLENOL) 500 MG tablet Take 500-1,000 mg by mouth every 6 (six) hours as needed (for pain.).    [provider]  amiodarone (PACERONE) 200 MG tablet Take 2 tablets (400 mg total) TWICE a day for 2 weeks, then take 1 tablet (200 mg total) TWICE a day for 2 weeks, then take 1 tablet (200 mg total) ONCE a day thereafter Patient taking differently: Take 200 mg by mouth at bedtime. 10/27/22   Camnitz, Andree Coss, MD  apixaban (ELIQUIS) 5 MG TABS tablet Take 5 mg by mouth 2 (two) times daily.    [provider]  carvedilol (COREG) 3.125 MG tablet Take 1 tablet (3.125 mg total) by mouth 2 (two) times daily with a meal. 08/20/22   [name], Lisette Abu, MD  empagliflozin (JARDIANCE) 25 MG TABS tablet Take 1 tablet (25 mg total) by mouth daily. 08/20/22   [name], Lisette Abu, MD  furosemide (LASIX) 40 MG tablet Take 1 1/2 tablet by mouth in the morning and 1 tablet by mouth in the afternoon for 4 days then go back down to 1 tablet by mouth twice a day Patient  taking differently: Take 40 mg by mouth 2 (two) times daily. Morning, and at [facility] 10/22/22   Duke, Roe Rutherford, PA  gabapentin (NEURONTIN) 300 MG capsule Take 600 mg by mouth at bedtime.    [provider]  metFORMIN (GLUCOPHAGE-XR) 500 MG 24 hr tablet Take 1,000 mg by mouth 2 (two) times daily. 07/05/15   [provider]  [facility] VERIO test strip 1 each daily. 08/16/19   [provider]  rosuvastatin (CRESTOR) 10 MG tablet Take 1 tablet (10 mg total) by mouth every other day. 10/22/22   Duke, Roe Rutherford, PA     Allergies    Entresto [sacubitril-valsartan] and Sacubitril   Review of Systems   Review of Systems Please see HPI for pertinent positives and negatives  Physical Exam BP (!) 128/95   Pulse (!) 57   Temp 98.2 F (36.8 C)   Resp 18   Ht 6\' 2"  (1.88 m)   Wt 94.8 kg   SpO2 97%   BMI 26.83 kg/m   Physical Exam Vitals and nursing note reviewed.  HENT:     Head: Normocephalic.     Nose: Nose normal.  Eyes:     Extraocular Movements: Extraocular movements intact.  Cardiovascular:     Rate and Rhythm: Normal rate and regular rhythm.  Pulmonary:  Effort: Pulmonary effort is normal.  Musculoskeletal:        General: Normal range of motion.     Cervical back: Neck supple.     Comments: There is oozing of blood from the right groin puncture site, no hematoma. Femoral pulse is normal.   Skin:    Findings: No rash (on exposed skin).  Neurological:     Mental Status: He is alert and oriented to person, place, and time.  Psychiatric:        Mood and Affect: Mood normal.     ED Results / Procedures / Treatments   EKG None  Procedures Procedures  Medications Ordered in the ED Medications  lidocaine-EPINEPHrine (XYLOCAINE W/EPI) 2 %-1:200000 (PF) injection 10 mL (has no administration in time range)  lidocaine-EPINEPHrine (XYLOCAINE W/EPI) 2 %-1:200000 (PF) injection (has no administration in time range)    Initial Impression and  Plan  Patient here with bleeding from puncture site in R groin after catheter ablation about 24 hours ago. RN is holding pressure, will check with cardiology for recommendations.   ED Course   Clinical Course as of 01/20/23 0633  Wed Jan 20, 2023  3244 Spoke with Dr. Mackie Pai, Cardiology, who recommends another injection of lido with epi and a pressure dressing.  [CS]  0548 4mL of 2% lidocaine with epi injected into the area around his puncture site and pressure held for 5 minutes with seemingly good hemostasis. A pressure dressing was applied. Will continue to observe for re-bleeding.  [CS]  0630 Patient remains hemostatic. Recommend he leave the dressing in place for another 24 hours. Additional gauze given to patient. Recommend he call his Cardiologist if he bleeds again, RTED for any other concerns.  [CS]    Clinical Course User Index [CS] Pollyann Savoy, MD     MDM Rules/Calculators/A&P Medical Decision Making Problems Addressed: Other postoperative complication of skin: acute illness or injury  Risk Prescription drug management.     Final Clinical Impression(s) / ED Diagnoses Final diagnoses:  Other postoperative complication of skin    Rx / DC Orders ED Discharge Orders     None        Pollyann Savoy, MD 01/20/23 [phone]

## 2023-01-21 ENCOUNTER — Ambulatory Visit (HOSPITAL_COMMUNITY)
Admission: RE | Admit: 2023-01-21 | Discharge: 2023-01-21 | Disposition: A | Discharge status: Home / Self Care | Payer: Medicare Other | Source: Ambulatory Visit | Attending: Physician Assistant | Admitting: Physician Assistant

## 2023-01-21 DIAGNOSIS — I4891 Unspecified atrial fibrillation: Secondary | ICD-10-CM | POA: Insufficient documentation

## 2023-01-21 NOTE — Progress Notes (Signed)
Patient presents to the AF clinic for a groin site check. He was seen at the ED yesterday with bleeding from the right groin site post ablation. He was injected with lidocaine and epinephrine and a pressure dressing was placed.   Today, his dressing had a small amount of old appearing blood. The site was clean and dry, no bleeding, no swelling or tenderness. A new bandage was placed. Advised patient to extend groin precautions for an additional week.   Follow up in the AF clinic as scheduled.

## 2023-01-25 DIAGNOSIS — Z6826 Body mass index (BMI) 26.0-26.9, adult: Secondary | ICD-10-CM | POA: Diagnosis not present

## 2023-01-25 DIAGNOSIS — H9192 Unspecified hearing loss, left ear: Secondary | ICD-10-CM | POA: Diagnosis not present

## 2023-01-25 DIAGNOSIS — H6122 Impacted cerumen, left ear: Secondary | ICD-10-CM | POA: Diagnosis not present

## 2023-01-25 DIAGNOSIS — R030 Elevated blood-pressure reading, without diagnosis of hypertension: Secondary | ICD-10-CM | POA: Diagnosis not present

## 2023-02-01 ENCOUNTER — Ambulatory Visit (INDEPENDENT_AMBULATORY_CARE_PROVIDER_SITE_OTHER): Payer: Medicare Other

## 2023-02-01 DIAGNOSIS — I4891 Unspecified atrial fibrillation: Secondary | ICD-10-CM

## 2023-02-01 LAB — CUP PACEART REMOTE DEVICE CHECK
Battery Remaining Longevity: 97 mo
Battery Voltage: 3.01 V
Brady Statistic AP VP Percent: 33.78 %
Brady Statistic AP VS Percent: 0.13 %
Brady Statistic AS VP Percent: 65.9 %
Brady Statistic AS VS Percent: 0.19 %
Brady Statistic RA Percent Paced: 33.55 %
Brady Statistic RV Percent Paced: 98.63 %
Date Time Interrogation Session: 20240805022826
HighPow Impedance: 58 Ohm
Implantable Lead Connection Status: 753985
Implantable Lead Connection Status: 753985
Implantable Lead Connection Status: 753985
Implantable Lead Implant Date: 20231102
Implantable Lead Implant Date: 20231102
Implantable Lead Implant Date: 20231102
Implantable Lead Location: 753858
Implantable Lead Location: 753859
Implantable Lead Location: 753860
Implantable Lead Model: 4598
Implantable Lead Model: 5076
Implantable Lead Model: 6935
Implantable Pulse Generator Implant Date: 20231102
Lead Channel Impedance Value: 171 Ohm
Lead Channel Impedance Value: 180 Ohm
Lead Channel Impedance Value: 180 Ohm
Lead Channel Impedance Value: 180 Ohm
Lead Channel Impedance Value: 190 Ohm
Lead Channel Impedance Value: 266 Ohm
Lead Channel Impedance Value: 342 Ohm
Lead Channel Impedance Value: 342 Ohm
Lead Channel Impedance Value: 380 Ohm
Lead Channel Impedance Value: 380 Ohm
Lead Channel Impedance Value: 380 Ohm
Lead Channel Impedance Value: 456 Ohm
Lead Channel Impedance Value: 456 Ohm
Lead Channel Impedance Value: 589 Ohm
Lead Channel Impedance Value: 589 Ohm
Lead Channel Impedance Value: 589 Ohm
Lead Channel Impedance Value: 589 Ohm
Lead Channel Impedance Value: 627 Ohm
Lead Channel Pacing Threshold Amplitude: 0.5 V
Lead Channel Pacing Threshold Amplitude: 0.875 V
Lead Channel Pacing Threshold Amplitude: 1.125 V
Lead Channel Pacing Threshold Pulse Width: 0.4 ms
Lead Channel Pacing Threshold Pulse Width: 0.4 ms
Lead Channel Pacing Threshold Pulse Width: 0.4 ms
Lead Channel Sensing Intrinsic Amplitude: 1.75 mV
Lead Channel Sensing Intrinsic Amplitude: 1.75 mV
Lead Channel Sensing Intrinsic Amplitude: 8 mV
Lead Channel Sensing Intrinsic Amplitude: 8 mV
Lead Channel Setting Pacing Amplitude: 1.5 V
Lead Channel Setting Pacing Amplitude: 2 V
Lead Channel Setting Pacing Amplitude: 2 V
Lead Channel Setting Pacing Pulse Width: 0.4 ms
Lead Channel Setting Pacing Pulse Width: 0.4 ms
Lead Channel Setting Sensing Sensitivity: 0.3 mV
Zone Setting Status: 755011

## 2023-02-02 ENCOUNTER — Ambulatory Visit: Payer: Medicare Other | Attending: Cardiology

## 2023-02-02 DIAGNOSIS — Z95810 Presence of automatic (implantable) cardiac defibrillator: Secondary | ICD-10-CM | POA: Diagnosis not present

## 2023-02-02 DIAGNOSIS — I5022 Chronic systolic (congestive) heart failure: Secondary | ICD-10-CM | POA: Diagnosis not present

## 2023-02-03 DIAGNOSIS — Z1211 Encounter for screening for malignant neoplasm of colon: Secondary | ICD-10-CM | POA: Diagnosis not present

## 2023-02-03 NOTE — Progress Notes (Signed)
EPIC Encounter for ICM Monitoring  Patient Name: [name] [name] is a 77 y.o. male Date: 02/03/2023 Primary Care Physican: [name], MD Primary Cardiologist: [facility]        Electrophysiologist: [name] Bi-V Pacing: 98.6%         09/21/2022 Weight: 213 lbs 10/06/2022 Weight: 219 lbs  11/17/2022 Weight: 212.5 lbs 12/22/2022 Weight: 211 lbs 01/13/2023 Weight: 209 lbs   Clinical Status (19-Jan-2023 to 01-Feb-2023) Time in AT/AF  0.0 hr/day (0.0%)                                                          Spoke with patient and heart failure questions reviewed.  Transmission results reviewed.  Pt reports feeling well but did have ED visit after he had an ablation due to some bleeding.    Optivol thoracic impedance suggesting normal fluid levels.    Prescribed:  Furosemide 40 mg take 1 tablet(s) (40 mg total) by mouth every morning and at bedtime.  Dr [name] advised to hold Furosemide if BP is <120   Labs: 01/05/2023 Creatinine 1.27, BUN 22, Potassium 4.2, Sodium 142, GFR 58 12/02/2022 Creatinine 1.20, BUN 17, Potassium 4.0, Sodium 137, GFR >60  11/11/2022 Creatinine 1.18, BUN 20, Potassium 3.6, Sodium 134, GFR >60  11/10/2022 Creatinine 1.25, BUN 21, Potassium 3.8, Sodium 139, GFR 59  11/09/2022 Creatinine 1.39, BUN 26, Potassium 3.4, Sodium 136, GFR 52 10/12/2022 Creatinine 1.04, BUN 17, Potassium 4.8, Sodium 144  10/02/2022 Creatinine 1.13, BUN 19, Potassium 4.3, Sodium 141, GFR 67  09/07/2022 Creatinine 0.98, BUN 20, Potassium 4.2, Sodium 144, GFR 79  A complete set of results can be found in Results Review.   Recommendations:  Recommendation to limit salt and fluid intake.  Encouraged to call if experiencing any fluid symptoms.    Follow-up plan: ICM clinic phone appointment on 03/08/2023.   91 day device clinic remote transmission 05/03/2023.     EP/Cardiology Office Visits: 02/26/2023 with Dr. [name].  04/21/2023 with Dr [name].    Copy of ICM check sent to Dr. [name].     3 month  ICM trend: 02/01/2023.    12-14 Month ICM trend:     Karie Soda, RN 02/03/2023 4:37 PM

## 2023-02-12 DIAGNOSIS — H903 Sensorineural hearing loss, bilateral: Secondary | ICD-10-CM | POA: Diagnosis not present

## 2023-02-12 DIAGNOSIS — H838X3 Other specified diseases of inner ear, bilateral: Secondary | ICD-10-CM | POA: Diagnosis not present

## 2023-02-12 DIAGNOSIS — H6123 Impacted cerumen, bilateral: Secondary | ICD-10-CM | POA: Diagnosis not present

## 2023-02-15 DIAGNOSIS — R2681 Unsteadiness on feet: Secondary | ICD-10-CM | POA: Diagnosis not present

## 2023-02-15 DIAGNOSIS — S065X0S Traumatic subdural hemorrhage without loss of consciousness, sequela: Secondary | ICD-10-CM | POA: Diagnosis not present

## 2023-02-15 DIAGNOSIS — M6281 Muscle weakness (generalized): Secondary | ICD-10-CM | POA: Diagnosis not present

## 2023-02-15 DIAGNOSIS — R262 Difficulty in walking, not elsewhere classified: Secondary | ICD-10-CM | POA: Diagnosis not present

## 2023-02-16 ENCOUNTER — Encounter (HOSPITAL_COMMUNITY): Payer: Self-pay | Admitting: Internal Medicine

## 2023-02-16 ENCOUNTER — Ambulatory Visit (HOSPITAL_COMMUNITY)
Admit: 2023-02-16 | Discharge: 2023-02-16 | Disposition: A | Discharge status: Home / Self Care | Payer: Medicare Other | Source: Ambulatory Visit | Attending: Internal Medicine | Admitting: Internal Medicine

## 2023-02-16 VITALS — BP 114/56 | HR 56 | Ht 74.0 in | Wt 205.2 lb

## 2023-02-16 DIAGNOSIS — Z951 Presence of aortocoronary bypass graft: Secondary | ICD-10-CM | POA: Diagnosis not present

## 2023-02-16 DIAGNOSIS — Z7984 Long term (current) use of oral hypoglycemic drugs: Secondary | ICD-10-CM | POA: Insufficient documentation

## 2023-02-16 DIAGNOSIS — I4811 Longstanding persistent atrial fibrillation: Secondary | ICD-10-CM

## 2023-02-16 DIAGNOSIS — Z7901 Long term (current) use of anticoagulants: Secondary | ICD-10-CM | POA: Insufficient documentation

## 2023-02-16 DIAGNOSIS — I110 Hypertensive heart disease with heart failure: Secondary | ICD-10-CM | POA: Insufficient documentation

## 2023-02-16 DIAGNOSIS — E119 Type 2 diabetes mellitus without complications: Secondary | ICD-10-CM | POA: Insufficient documentation

## 2023-02-16 DIAGNOSIS — I5022 Chronic systolic (congestive) heart failure: Secondary | ICD-10-CM | POA: Insufficient documentation

## 2023-02-16 DIAGNOSIS — I480 Paroxysmal atrial fibrillation: Secondary | ICD-10-CM

## 2023-02-16 DIAGNOSIS — D6869 Other thrombophilia: Secondary | ICD-10-CM | POA: Insufficient documentation

## 2023-02-16 DIAGNOSIS — Z79899 Other long term (current) drug therapy: Secondary | ICD-10-CM | POA: Diagnosis not present

## 2023-02-16 NOTE — Progress Notes (Signed)
Remote ICD transmission.   

## 2023-02-16 NOTE — Progress Notes (Signed)
Primary Care Physician: [name], MD Primary Cardiologist: Dr. [name] Primary Electrophysiologist: Dr. [name] Referring Physician: Dr. Jackquline Berlin [name] [name] is a 77 y.o. male with a history of CAD s/p CABG, OSA, HFrEF, s/p Medtronic CRT-D implanted 04/30/22, T2DM, HTN, HLD, and persistent atrial fibrillation who presents for consultation in the [facility]. Patient underwent successful cardioversion on 10/16/22 x 2 shocks but unfortunately had ERAF. Seen by Dr. [name] on 10/27/22 and started on amiodarone as a bridge to ablation (scheduled for 01/19/23) with plan to repeat DCCV. He was admitted due to fall 5/13-15/24 with evidence of subdural hematoma on CT imaging. Eliquis held and plan per neurosurgery to resume in 1 month. He fell again on 5/18 and plan amended to hold Eliquis for one more week and to resume ~5/25. Patient is on Eliquis 5 mg BID (temporarily on hold due to subdural hematoma) for a CHADS2VASC score of 6.  On evaluation today, he appears to be in atrial flutter vs afib with slow ventricular response. He feels tired when out of rhythm. He restarted Eliquis 5 mg BID on 5/28 with 2 full doses. He feels well and now ambulates with a walker around the house to prevent additional falls. He is scheduled to see Neurology after scheduled ablation; family tried to move it sooner but unable to do so. Overall, he states he feels well and wishes to proceed with ablation as scheduled.   On follow up 12/23/22, he is currently in NSR. S/p successful DCCV on 12/16/22. No falls since cardioversion. He does note feeling as thought he has very little energy, despite being in normal rhythm. He specifically states he had more energy when he was loading on amiodarone. No weight gain noted over past few days. No SOB or leg swelling. He currently takes amiodarone 200 mg daily.   On follow up 02/16/23 he is currently in NSR. S/p Afib and atrial flutter ablation on 01/19/23 by  Dr. [name]. No episodes of Afib since ablation. He does not have the energy back that he was hoping for. His daughter notes that he has been losing weight possibly related to insufficient eating; she is unsure if he is on too much lasix. No chest pain, SOB, or trouble swallowing. Leg sites healed without issue. No missed doses of anticoagulant.  Today, he denies symptoms of orthopnea, PND, lower extremity edema, dizziness, presyncope, syncope, snoring, daytime somnolence, bleeding, or neurologic sequela. The patient is tolerating medications without difficulties and is otherwise without complaint today.   he has a BMI of Body mass index is 26.35 kg/m.Marland Kitchen Filed Weights   02/16/23 1326  Weight: 93.1 kg    Atrial Fibrillation Management history:  Previous antiarrhythmic drugs: amiodarone Previous cardioversions: 08/2013, 04/2019, 10/16/22, 12/16/22 Previous ablations: 01/19/23 Anticoagulation history: Eliquis 5 mg BID    Past Medical History:  Diagnosis Date   Arthritis    Knee L - probably   Atrial fibrillation (HCC)    BPH (benign prostatic hyperplasia)    CAD (coronary artery disease) 07/06/2011   CHF (congestive heart failure) (HCC)    Coronary artery disease    Diabetes mellitus    Frequency of urination    Heart failure (HCC)    High cholesterol    History of nuclear stress test 09/2010   dipyridamole; mild perfusion defect due to attenuation with mild superimposed ischemia in apical septal, apical, apical inferior & apical lateral regions; rest LV enlarged in size; prominent gut uptake in  infero-apical region; no significant ischemia demonstrated; low risk scan    HNP (herniated nucleus pulposus), lumbar    Hypertension    Ischemic cardiomyopathy    LV dysfunction 07/06/2011   Nocturia    Right bundle branch block    S/P CABG (coronary artery bypass graft) 08/03/2011   x3; LIMA to LAD,, SVG to PDA, SVG to posterolateral branch of RCA; Dr. Wayland Salinas   Sleep apnea    sleep  study 10/2010- AHI during total sleep 32.1/hr and during REM 62.3/hr (severe sleep apnea)unable to tolerate c pap   Spinal stenosis of lumbar region    Stroke [facility])    [place]    Current Outpatient Medications  Medication Sig Dispense Refill   acetaminophen (TYLENOL) 500 MG tablet Take 500-1,000 mg by mouth every 6 (six) hours as needed (for pain.).     amiodarone (PACERONE) 200 MG tablet Take 2 tablets (400 mg total) TWICE a day for 2 weeks, then take 1 tablet (200 mg total) TWICE a day for 2 weeks, then take 1 tablet (200 mg total) ONCE a day thereafter 90 tablet 2   apixaban (ELIQUIS) 5 MG TABS tablet Take 5 mg by mouth 2 (two) times daily.     carvedilol (COREG) 3.125 MG tablet Take 1 tablet (3.125 mg total) by mouth 2 (two) times daily with a meal. 180 tablet 3   empagliflozin (JARDIANCE) 25 MG TABS tablet Take 1 tablet (25 mg total) by mouth daily. 90 tablet 3   furosemide (LASIX) 40 MG tablet Take 1 1/2 tablet by mouth in the morning and 1 tablet by mouth in the afternoon for 4 days then go back down to 1 tablet by mouth twice a day 180 tablet 3   gabapentin (NEURONTIN) 300 MG capsule Take 600 mg by mouth at bedtime.     metFORMIN (GLUCOPHAGE-XR) 500 MG 24 hr tablet Take 1,000 mg by mouth 2 (two) times daily.     ONETOUCH VERIO test strip 1 each daily.     rosuvastatin (CRESTOR) 10 MG tablet Take 1 tablet (10 mg total) by mouth every other day. 50 tablet 3   No current facility-administered medications for this encounter.    Allergies  Allergen Reactions   Entresto [Sacubitril-Valsartan] Other (See Comments)    dizziness   Sacubitril Other (See Comments)     Dizziness    ROS- All systems are reviewed and negative except as per the HPI above.  Physical Exam: Vitals:   02/16/23 1326  BP: (!) 114/56  Pulse: (!) 56  Weight: 93.1 kg  Height: 6\' 2"  (1.88 m)    GEN- The patient is well appearing, alert and oriented x 3 today.   Neck - no JVD or carotid bruit noted Lungs-  Clear to ausculation bilaterally, normal work of breathing Heart- Regular rate and rhythm, no murmurs, rubs or gallops, PMI not laterally displaced Extremities- no clubbing, cyanosis. 1+ bilateral ankle edema Skin - no rash or ecchymosis noted   Wt Readings from Last 3 Encounters:  02/16/23 93.1 kg  01/20/23 94.8 kg  01/19/23 94.8 kg    EKG today demonstrates  Vent. rate 56 BPM PR interval 162 ms QRS duration 160 ms QT/QTcB 508/490 ms P-R-T axes 71 236 10 AV dual-paced rhythm Biventricular pacemaker detected Abnormal ECG When compared with ECG of 19-Jan-2023 10:02, PREVIOUS ECG IS PRESENT  Echo 11/10/22 demonstrated:  1. Left ventricular ejection fraction, by estimation, is 25 to 30%. The  left ventricle has severely decreased function.  The left ventricle  demonstrates global hypokinesis. The left ventricular internal cavity size  was mildly to moderately dilated. Left  ventricular diastolic parameters are indeterminate. Elevated left  ventricular end-diastolic pressure.   2. Right ventricular systolic function is moderately reduced. The right  ventricular size is moderately enlarged.   3. The mitral valve is normal in structure. Moderate mitral valve  regurgitation. No evidence of mitral stenosis.   4. Tricuspid valve regurgitation is mild to moderate.   5. The aortic valve is tricuspid. Aortic valve regurgitation is not  visualized. No aortic stenosis is present.   6. The inferior vena cava is normal in size with greater than 50%  respiratory variability, suggesting right atrial pressure of 3 mmHg.  Epic records are reviewed at length today.  CHA2DS2-VASc Score = 6  The patient's score is based upon: CHF History: 1 HTN History: 1 Diabetes History: 1 Stroke History: 0 Vascular Disease History: 1 Age Score: 2 Gender Score: 0       ASSESSMENT AND PLAN: Longstanding persistent Atrial Fibrillation (ICD10:  I48.19) The patient's CHA2DS2-VASc score is 6, indicating a  9.7% annual risk of stroke.   S/p successful DCCV on 12/16/22.  S/p Afib and atrial flutter ablation on 01/19/23 by Dr. [name].  He is currently in NSR.   Continue amiodarone 100 mg daily.  Secondary Hypercoagulable State (ICD10:  D68.69) The patient is at significant risk for stroke/thromboembolism based upon his CHA2DS2-VASc Score of 6.  Continue Apixaban (Eliquis).  No missed doses since 5/28.    F/u as scheduled with Dr. [name] and Dr. [name].    [name] Bells, PA-C [facility] [address] [place], [place] [address] [phone] 02/16/2023 2:11 PM

## 2023-02-18 ENCOUNTER — Ambulatory Visit: Payer: Medicare Other | Admitting: Neurology

## 2023-02-18 DIAGNOSIS — S065X0S Traumatic subdural hemorrhage without loss of consciousness, sequela: Secondary | ICD-10-CM | POA: Diagnosis not present

## 2023-02-18 DIAGNOSIS — R262 Difficulty in walking, not elsewhere classified: Secondary | ICD-10-CM | POA: Diagnosis not present

## 2023-02-18 DIAGNOSIS — M6281 Muscle weakness (generalized): Secondary | ICD-10-CM | POA: Diagnosis not present

## 2023-02-18 DIAGNOSIS — R2681 Unsteadiness on feet: Secondary | ICD-10-CM | POA: Diagnosis not present

## 2023-02-19 ENCOUNTER — Ambulatory Visit (HOSPITAL_COMMUNITY)
Admission: RE | Admit: 2023-02-19 | Discharge: 2023-02-19 | Disposition: A | Discharge status: Home / Self Care | Payer: Medicare Other | Source: Ambulatory Visit | Attending: Internal Medicine | Admitting: Internal Medicine

## 2023-02-19 DIAGNOSIS — I255 Ischemic cardiomyopathy: Secondary | ICD-10-CM | POA: Insufficient documentation

## 2023-02-19 DIAGNOSIS — I5022 Chronic systolic (congestive) heart failure: Secondary | ICD-10-CM | POA: Diagnosis not present

## 2023-02-19 LAB — ECHOCARDIOGRAM COMPLETE
Area-P 1/2: 4.31 cm2
Calc EF: 36.1 %
MV M vel: 4.55 m/s
MV Peak grad: 82.8 mmHg
MV VTI: 1.52 cm2
S' Lateral: 5.3 cm
Single Plane A2C EF: 38 %
Single Plane A4C EF: 33.5 %

## 2023-02-19 NOTE — Progress Notes (Signed)
*  PRELIMINARY RESULTS* Echocardiogram 2D Echocardiogram has been performed.  Stacey Drain 02/19/2023, 9:26 AM

## 2023-02-24 ENCOUNTER — Ambulatory Visit: Payer: Medicare Other | Admitting: Gastroenterology

## 2023-02-26 ENCOUNTER — Ambulatory Visit: Payer: Medicare Other | Attending: Internal Medicine | Admitting: Internal Medicine

## 2023-02-26 ENCOUNTER — Encounter: Payer: Self-pay | Admitting: Internal Medicine

## 2023-02-26 VITALS — BP 114/52 | HR 80 | Ht 73.0 in | Wt 204.6 lb

## 2023-02-26 DIAGNOSIS — Z951 Presence of aortocoronary bypass graft: Secondary | ICD-10-CM | POA: Insufficient documentation

## 2023-02-26 DIAGNOSIS — I5022 Chronic systolic (congestive) heart failure: Secondary | ICD-10-CM | POA: Insufficient documentation

## 2023-02-26 DIAGNOSIS — E785 Hyperlipidemia, unspecified: Secondary | ICD-10-CM | POA: Insufficient documentation

## 2023-02-26 DIAGNOSIS — Z95810 Presence of automatic (implantable) cardiac defibrillator: Secondary | ICD-10-CM | POA: Insufficient documentation

## 2023-02-26 DIAGNOSIS — I255 Ischemic cardiomyopathy: Secondary | ICD-10-CM | POA: Diagnosis not present

## 2023-02-26 NOTE — Progress Notes (Signed)
OFFICE NOTE  Chief Complaint:  Follow-up CHF  Primary Care Physician: [name], MD  HPI:  [name] [name] is a pleasant 77 year old gentleman previously followed by Dr. Alanda Amass. His past medical history is significant for coronary artery disease. In 2013 he underwent multivessel CABG for an ischemic cardiomyopathy. EF was 20-25% prior to surgery however post surgery his EF had improved up to 45-50%. Recently his EF was 40-45% by echo in May of 2014. There was mild mitral regurgitation, mild to moderately dilated left atrium and mild LVH. [name] [name] has been describing some anxiety. He also reports some pain in his legs however underwent Dopplers in September 2014 which showed preserved ABIs bilaterally a 1.0 on the right and 1.1 on the left. The bilateral peroneal and posterior tibial arteries were occluded. He reports some optimal control of his diabetes. His A1c was 7.2. He is concerned about the cost of taking Tradjenta. He is followed by Dr. Leslie Dales.   He was recently seen in the emergency room and he can hospital. There he presented with hypotension and was given 1 L normal saline and his symptoms improved. He had been having dizziness as well as some upper back pain into the left shoulder blade with exertion recently. The symptoms are worse particularly when climbing up ladders or going up stairs and are relieved at rest. After that hospitalization it was recommended that he discontinue his ACE inhibitor and beta blocker, and his blood pressure appears improved today up to 110/60.  [name] [name] returns today for followup of his stress test. This is interpreted as nonischemic with an EF of 44%. He reports still feeling very fatigued and extremely short of breath when doing activities. He says that the other day he tried to hit golf balls and felt that he was totally exhausted after just an hour. He reports his blood pressure has improved somewhat off of all medications however remains  low. He has had symptoms of orthostatic hypotension in the past. His symptoms do feel similar to prior to his bypass surgery. He also feels that he was much better after surgery than he is now and that his symptoms have been going on for the past 2 months.  At his last office visit, I recommended that [name] [name] have a repeat cardiac catheterization (08/2013). This demonstrated the following:  Impression:  1. 2 vessel native CAD with occluded LAD in the mid-vessel  2. Patent LIMA-LAD, SVG to PLA and SVG to PDA grafts with TIMI III flow  3. LVEDP = 13 mmHg  4. Random PVC's were noted  Based on these findings, I could not find any new cardiac cause of his symptoms. I suspect that some of his fatigue could be related to labile blood sugars. He says unexplained hypotension but is normotensive off of medications.  [name] [name] returns today for followup. He now reports feeling better since he had change in his medications. He is established with Dr. Sharl Ma who adjusted his diabetes medicines and stopped his Invokana. His shortness of breath and fatigue in both improved. He is now been expected some problems with left heel pain. He was found to have some spinal stenosis but has had improvement with inserts in his shoes.  I saw [name] [name] back in the office today. He has successfully underwent back surgery earlier this year which she says initially was much improved, however he says he subsequently developed more pain going down his legs. This sounds like it's neuropathic pain, but  is reluctant to try medication for it. He also significantly fatigue. This could be due to a number of etiologies. 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 Also, he is noted to have a history of obstructive sleep apnea. He was placed on BiPAP, but has not been compliant with that due to difficulty wearing the mask. He also never  had follow-up of his sleep study.  [name] [name] returns today for follow-up. He reports he's had worsening dyspnea and fatigue. He says that he can hardly sleep at night. He is struggling with significant anxiety. 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 He's not been wearing the BiPAP due to significant anxiety and difficulty sleeping. He feels like he gets smothered with his machine. Communication between Dr. Mayford Knife and Dr. Neita Carp led to the starting of Celexa as well as Klonopin to use as needed at night. He says that it does give him about 6 hours of sleep. He has not been using his BiPAP as instructed. He is reportedly had more worsening shortness of breath. He was seen in the ER for this and it was thought this was due [email or web address] with BiPAP. His BNP however is elevated over 300. Today in the office he says that he's had more swelling and more abdominal fullness as well as leg edema.  I had the pleasure seeing [name] [name] back today in the office. He seems to respond nicely to Lasix. I started him on 40 mg daily and while I was out of town my partner reviewed his lab work which included an elevated BNP over 300. He increased his Lasix to 40 mg twice a day. [name] [name] says he's had a significant improvement in his breathing. The BNP now is in the mid 200s. Renal function is stable based on labs today. Unfortunately, his echocardiogram does show a new cardiomyopathy with EF 30-35%. It should be noted that he felt "awful" on blood pressure/heart failure medicines and had taken himself off of ACE inhibitor and beta blocker in the past. The echo however does suggest inferior and anterolateral wall motion abnormalities which are new and could indicate graft dysfunction. I performed his last heart catheterization in 2015 which showed all 3 grafts patent.  [name] [name] returns today for follow-up of his left heart catheterization. I perform this a  few weeks ago and he had patent bypass grafts however LV function is reduced. Cardiac output is also reduced. Filling pressures were mildly elevated. He reports since discharge that he's had some improvement of his symptoms. He was started on low-dose beta blocker but he does report fatigue. When I asked him how they compared to previously he said he thinks attack sleep better since he started on the beta blocker but not back to what he thinks his baseline should be. He continues to have problems with left leg pain. He had ultrasound of the left leg which were unrevealing. He also had nerve conduction studies which I sent him for which were not suggestive of neuropathy. His symptoms were worse while lying flat on the Cath Lab table and I suspect this could be again coming from his back and he may need repeat orthopedic evaluation. His primary care provider started him on Celexa recently as well for anxiety.  12/23/2015  [name] [name] was seen back in the office today in follow-up.  Overall he seems to be doing well. He's tolerating low-dose carvedilol and has had some very infrequent morning dizziness. He was previously on Lasix 40 mg twice a day but ran out of the medicine about 8 days ago. He's not had any worsening weight gain or swelling nor worsening shortness of breath over the past week. This could be that he's had some improvement in his cardiomyopathy. Nevertheless, I would like him to be on some Lasix at least until we can determine whether or not he is euvolemic with lab work.  05/27/2016  [name] [name] returns today for follow-up. He's gained about 6 pounds of weight since I last saw him. He denies any worsening shortness of breath or orthostatic dizziness. He saw Dr. Mayford Knife in August for following of his obstructive sleep apnea. He remains physically active and denies any chest pain. He recently had a repeat echo in October 2017 which showed a small improvement in LV function with EF up to 35-40%. Heart  failure regimen includes aspirin, carvedilol, Crestor, and spironolactone. He is not currently on a ARB is he's had orthostatic symptoms in the past although that his generally resolved. His creatinine is normal.  08/24/2016  I saw [name] [name] today in follow-up. He again gained about 4 pounds over the past month. He felt that the weight gain was heart failure related because he got more short of breath particularly walking up stairs. Based on that he increased his Lasix back to 40 mg daily. He notes that his urine is been darker and his urination is not necessarily improved. He's also been more dizzy. He says he gets somewhat presyncopal with change in position. Lab work 12 days ago indicated a stable creatinine however. Despite this, I feel that he may be somewhat presyncopal perhaps on too high of a dose of diuretics. He is also on Entresto 24/26 and Aldactone 12.5 mg daily. He also complained of some pain across the back between the shoulder blades. He said this got better with resting and drinking water. It's difficult to say whether that it was the water or perhaps resting from his activities. I cannot exclude that this could be angina. His EKG however is reassuring today.  09/21/2016  [name] [name] was seen today in follow-up. I decreased his Lasix, however he does not feel any change in his dizziness. There is no evidence of presyncope. He has gained about 4 pounds which I suspect is some fluid retention. Blood pressure is well-controlled today 118/56.  02/26/2017  [name] [name] returns today for follow-up. He had more recent dizziness and it seemed to worsen on Entresto. He discontinued that and feels that he's now much better. Overall he says he feels well. He is not on ACE inhibitor or ARB but remains on carvedilol. Is also on Lasix 40 mg daily. Blood pressure is stable 122/52.  08/10/2017  [name] [name] returns for follow-up.  Overall he seems to be feeling very well.  Denies any chest pain or  worsening shortness of breath.  He gets occasional dizziness but that is much improved.  Of note an EKG was performed again today and there is a suggestion that there may be underlying atrial flutter.  Heart rate was 74 and EKG looks very similar to his prior EKG.  The computer has interpreted a sinus rhythm with first-degree AV block and bifascicular block.  I compared EKG to his previous EKG last August which looks similar however it is distinctly different from the EKG last of February.  He is asymptomatic, but not anticoagulated.  Otherwise, labs reviewed from October indicate total cholesterol 134, HDL 36 LDL 67 and triglycerides 157.  Hemoglobin A1c of 7.6 and serum creatinine 0.9.  08/30/2017  [name] [name] returns today for follow-up of his studies.  He underwent an echocardiogram to evaluate for change in LVEF, but more importantly to rule out atrial flutter.  His EKG was abnormal and suggested possible atrial flutter, however he was asymptomatic.  The echo did not show any evidence of atrial flutter.  LVEF was stable at 40%.  He also underwent a home 48-hour Holter monitor.  This demonstrated sinus rhythm with PACs and PVCs, but no evidence of atrial fibrillation or flutter.  Remains asymptomatic.  His only other complaint today is chronic leg pain.  He was seen by Dr. Allyson Sabal and evaluated with lower extremity arterial Dopplers.  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  A referral to neurosurgery was suggested but not placed.  01/24/2018  Mr. Piecuch returns today for follow-up.  Overall he says he is doing really well.  He underwent 2 back injections and has had improvement in his back pain and leg pain.  He had an episode in June where he became somewhat dehydrated.  He presented to the ER with orthostatic hypotension.  His Lasix was cut back to 20 mg daily.  Weight is stayed stable effect is been improved from 252-246.  He says he is actually had enough energy  to play golf recently.  08/01/2018  Mr. Immerman seen today in routine follow-up.  He has some occasional dizziness which is persistent.  He says he gets a little lightheaded when he urinates.  He has been off of tamsulosin with no specific benefit and now is taking the medication every third day.  Blood pressure is stable.  Weight is stable.  He denies any chest pain or worsening shortness of breath with exertion.  His last echo showed an EF of 40% which is stable if not slightly improved from an echo in 2017.  Unfortunately he could not tolerate Entresto and remains on carvedilol.  He endorses NYHA class I-II heart failure symptoms.  Recent lab work showed total cholesterol 151, HDL 39, LDL 79 triglycerides 166.  Hemoglobin A1c of 7.3.  03/30/2019  Mr. Biamonte seen today in follow-up.  Overall he is doing well and has no complaints.  He has not recently struggled with any of the dizziness he has had previously.  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  In the past he has had EKG changes that were questionable for possible atrial flutter however monitoring as well as an echocardiogram failed to show any evidence of atrial flutter or fibrillation.  This is now clear finding of atrial fibrillation on EKG with an irregularly irregular rhythm with no evidence of P waves.  He reports being asymptomatic with this.  05/12/2019   Mr. Creager returns today for follow-up of his A. fib.  He reports he has been compliant with Eliquis.  He says he had a couple episodes of loose stools.  He thought it might be due to the medicine but this seems quite atypical.  He stopped some of his stool softener/bulking agents.  Weight is down about 3 pounds.  He increased his Lasix because he felt like it was a little wheezy.  Possibly could have had some more heart failure because he is in A. fib and  does have a history of heart failure.  EKG again shows atrial  flutter/fibrillation with variable AV response at 74.  Since this is presumably persistent at this point we discussed an elective cardioversion and he is agreeable to this.  06/13/2019  Mr. Krell was seen today in follow-up.  He underwent cardioversion by myself in November.  Since then he has felt fairly well.  His EKG today shows that he is maintaining sinus rhythm.  Blood pressure was 119/63 however he has had some positional dizziness at home.  His daughter notes he has had some blood pressures with low diastolics less than 50 and he does feel some dizziness.  He also feels some fatigue when exerting himself a lot.  I suspect this could be due to hypotension.  Is possible also that his EF has improved now that he is back in sinus and that he may be over diuresed being on both Lasix and Aldactone.  09/15/2019  RandallLoporto returns for follow-up.  He reports improvement in his orthostatic symptoms after stopping Aldactone and his PCP also stopped tamsulosin.  He is now on some saw palmetto for BPH.  Unfortunately he does get a little bit more short of breath and I wonder if this is due to need for more diuretic.  His LVEF was 40% last year however has not been reassessed.  I would like to get an echo to recheck that.  03/25/2020  Mr. Campi is seen today for 47-month follow-up.  He denies any further orthostatic symptoms.  He was not feeling well on Aldactone.  He was also taken off tamsulosin.  He is only on carvedilol for heart failure and unfortunately his LVEF declined further in March down to 5 to 40%, previously 40 to 45%.  I had increased his Lasix due to signs of increased LV filling pressure however then he became a little bit prerenal.  His Lasix was then decreased to 40 mg every morning and 20 mg every afternoon.  Since then he has been asymptomatic with heart failure at most with NYHA class II symptoms.  Is been struggling with issues with the CPAP and that the humidifier broke.  This is a  [employer] device and he was told by the company that they are no longer manufacturing them rather they are making ventilators because of COVID-19.  Lipid profile in June 2021 showed total cholesterol 168, HDL 32, triglycerides 106 and 116.  A1c was 7.7 and both remain above goal.  09/20/2020  Mr. Pierotti returns today for follow-up.  Overall he seems to be doing pretty well.  He struggled with a small wound under his left fifth metatarsal.  He is going to the wound center on April 1.  EF had declined somewhat down to 40 to 45%.  Unfortunately has been intolerant of Entresto and losartan more recently causing worsening fatigue.  His options for heart failure medications have been limited.  He is a diabetic with recent A1c at 6.8.  We discussed today the possibility of adding an SGLT2 inhibitor based on positive data in heart failure and diabetes.  01/01/2021  Mr. Rosario is seen today in follow-up.  He seems somewhat desponded today.  He says he has a lot going on and and unfortunately has been dealing with wound that is nonhealing in his left leg.  He said that even working with the wound center he has developed osteomyelitis.  There talk about possible amputation.  He has been noted to have some mild reduction  in blood flow to that leg.  He is scheduled to actually have a retrograde angiogram tomorrow with Dr. Chestine Spore.  He is also going to see Dr. Drue Second with infectious diseases.  Hopefully they will be able to get control of this before having to consider an amputation.  He does say that he feels better though.  He had come off of alfuzosin which she was taking by his urologist and he now reports being less dizzy.  Also his blood sugars are significantly lower after starting the Jardiance.  08/14/2021  Mr. Giannino returns today for follow-up.  He seems to be doing much better.  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  He still struggles with some balance issues but is done better.  He had 1 episode where he became dizzy or lightheaded in January when he was playing golf in [place].  This seemed to resolve with rest and drinking some water.  He has done well with the Jardiance.  11/17/2021  Mr. Schettler is seen today as an acute add-on for shortness of breath.  He reports several weeks of worsening shortness of breath and difficulty breathing.  This past weekend it was difficult for him to sleep.  He called and noted that he increased his Lasix from 40 to 60 mg daily.  I advised him to stay on that because of his meeting today.  He reports over the past couple days he has started to diurese a little more.  He is swelling has improved but his breathing is still somewhat labored and he is fatigued.  Blood pressure was normal today.  Oxygen at saturation 96%.  He had repeat lipids in March, which showed a total cholesterol 172, triglycerides 86, HDL 38 and LDL lower at 118.  EKG today shows normal sinus rhythm.  01/21/2022  Mr. Colavito returns today for follow-up.  He is feeling much better after treatment of his heart failure.  He is down about 30 pounds of fluid.  He is currently on twice daily Lasix.  His echo had shown an LVEF of 30 to 35%.  Subsequently underwent heart catheterization by Dr. Excell Seltzer on 12/15/2021 which showed severe native coronary artery disease with patent left main and total occlusion of the LAD after the first diagonal.  The LIMA to LAD was patent.  There was interval occlusion of the SVG to PDA and SVG to PLA.  LVEDP was normal.  Given the fact that he had akinesis of the inferior and posterior walls and lateral walls on echo, the decision was made not to consider intervention of the native left circumflex.  We then pursued viability evaluation with cardiac MRI performed on 01/19/2022, this demonstrated severe LV dilatation with severe systolic dysfunction and LVEF  28%.  There was thinning and akinesis of the inferior and lateral walls.  The RV was mildly dilated with mild systolic dysfunction and RV EF 40%.  No LGE was noted however certain severe thinning of the apical and basal lateral wall and apical inferior wall suggested nonviability.  He was noted to have moderate mitral and tricuspid regurgitation.  08/20/2022  Mr. Raum returns today for follow-up.  He underwent CRT-D implantation back in November with Dr. [name].  This was successful and subsequently he reported improvement in his heart failure symptoms consistent with possible responder status.  He was just seen in  follow-up a couple of weeks ago and was felt to be doing well.  He has had some recent weight gain.  Probably 7 to 10 pounds over the past 3 to 4 months.  He reports some lower extremity edema primarily in his ankles.  He says that he is felt much less dizzy after cutting back on his beta-blocker.  10/12/2022  Mr. Kollin returns today for follow-up.  About a month ago he called in with concerns about worsening shortness of breath.  He had a device interrogation which showed possible fluid accumulation as well as had noted that he had been out of rhythm since mid March.  He was advised to increase his diuretics which she did and has had improvement in his swelling.  He still feels fatigued.  EKG today shows underlying A-fib with a ventricular paced rhythm and PVCs.  He had repeat labs recently including a metabolic profile and BNP.  BNP is elevated at 323 but improved somewhat from 512 about a month ago.  Creatinine is normal but slightly higher at 1.13.  02/26/2023  Mr. Garr is seen today in follow-up.  Since I last saw him he was diagnosed with atrial fibrillation and underwent cardioversion.  He had subsequent A-fib ablation by Dr. [name] in July.  Since then he has been doing very well.  He just had a repeat echo which showed mild improvement in LVEF up to 30 to 35%.  He had grade 3  diastolic dysfunction but has not had issues with heart failure symptoms.  Fact is lost 20 pounds since I saw him in April and according to his significant other has lost about 50 pounds over the past year.  He also says that his PCP had noted that he had an abnormal Cologuard test.  He was post to have colonoscopy but this was postponed due to his heart issues.  He is now considering that sooner.  PMHx:  Past Medical History:  Diagnosis Date   Arthritis    Knee L - probably   Atrial fibrillation (HCC)    BPH (benign prostatic hyperplasia)    CAD (coronary artery disease) 07/06/2011   CHF (congestive heart failure) (HCC)    Coronary artery disease    Diabetes mellitus    Frequency of urination    Heart failure (HCC)    High cholesterol    History of nuclear stress test 09/2010   dipyridamole; mild perfusion defect due to attenuation with mild superimposed ischemia in apical septal, apical, apical inferior & apical lateral regions; rest LV enlarged in size; prominent gut uptake in infero-apical region; no significant ischemia demonstrated; low risk scan    HNP (herniated nucleus pulposus), lumbar    Hypertension    Ischemic cardiomyopathy    LV dysfunction 07/06/2011   Nocturia    Right bundle branch block    S/P CABG (coronary artery bypass graft) 08/03/2011   x3; LIMA to LAD,, SVG to PDA, SVG to posterolateral branch of RCA; Dr. Wayland Salinas   Sleep apnea    sleep study 10/2010- AHI during total sleep 32.1/hr and during REM 62.3/hr (severe sleep apnea)unable to tolerate c pap   Spinal stenosis of lumbar region    Stroke [facility])    [place]     Past Surgical History:  Procedure Laterality Date   ABDOMINAL AORTOGRAM W/LOWER EXTREMITY Left 11/14/2020   Procedure: ABDOMINAL AORTOGRAM W/LOWER EXTREMITY;  Surgeon: Cephus Shelling, MD;  Location: [facility] INVASIVE CV LAB;  Service: Cardiovascular;  Laterality: Left;  ABDOMINAL AORTOGRAM W/LOWER EXTREMITY N/A 01/02/2021   Procedure: ABDOMINAL  AORTOGRAM W/LOWER EXTREMITY;  Surgeon: Cephus Shelling, MD;  Location: [facility] INVASIVE CV LAB;  Service: Cardiovascular;  Laterality: N/A;   AMPUTATION Left 03/20/2021   Procedure: Left foot 5th ray amputation;  Surgeon: Toni Arthurs, MD;  Location: [facility] OR;  Service: Orthopedics;  Laterality: Left;   ATRIAL FIBRILLATION ABLATION N/A 01/19/2023   Procedure: ATRIAL FIBRILLATION ABLATION;  Surgeon: [name], MD;  Location: [facility] INVASIVE CV LAB;  Service: Cardiovascular;  Laterality: N/A;   BIV ICD INSERTION CRT-D N/A 04/30/2022   Procedure: BIV ICD INSERTION CRT-D;  Surgeon: [name], MD;  Location: [facility] INVASIVE CV LAB;  Service: Cardiovascular;  Laterality: N/A;   CARDIAC CATHETERIZATION  01/2011   ischemic cardiomyopathy 30-35%, multivessel CAD (Dr. Bishop Limbo)    CARDIAC CATHETERIZATION  09/13/2015   Procedure: Right/Left Heart Cath and Coronary/Graft Angiography;  Surgeon: [name], MD;  Location: [facility] INVASIVE CV LAB;  Service: Cardiovascular;;   CARDIOVERSION N/A 05/16/2019   Procedure: CARDIOVERSION;  Surgeon: [name], MD;  Location: [facility] ENDOSCOPY;  Service: Cardiovascular;  Laterality: N/A;   CARDIOVERSION N/A 10/16/2022   Procedure: CARDIOVERSION;  Surgeon: Thurmon Fair, MD;  Location: [facility] INVASIVE CV LAB;  Service: Cardiovascular;  Laterality: N/A;   CARDIOVERSION N/A 12/16/2022   Procedure: CARDIOVERSION;  Surgeon: Little Ishikawa, MD;  Location: [facility] INVASIVE CV LAB;  Service: Cardiovascular;  Laterality: N/A;   Colonscopy     CORONARY ARTERY BYPASS GRAFT  08/03/2011   Procedure: CORONARY ARTERY BYPASS GRAFTING (CABG);  Surgeon: Alleen Borne, MD;  Location: [facility] OR;  Service: Open Heart Surgery;  Laterality: N/A;  CABG times three using right saphenous vein and left mammary artery usisng endoscope.   LEFT HEART CATH AND CORS/GRAFTS ANGIOGRAPHY N/A 12/15/2021   Procedure: LEFT HEART CATH AND CORS/GRAFTS ANGIOGRAPHY;  Surgeon: Tonny Bollman, MD;  Location:  [facility] INVASIVE CV LAB;  Service: Cardiovascular;  Laterality: N/A;   LEFT HEART CATHETERIZATION WITH CORONARY/GRAFT ANGIOGRAM N/A 09/20/2013   Procedure: LEFT HEART CATHETERIZATION WITH Isabel Caprice;  Surgeon: [name], MD;  Location: [facility] CATH LAB;  Service: Cardiovascular;  Laterality: N/A;   Lower Extremity Arterial Doppler  03/16/2013   bilat ABIs demonstated normal values; R runoff - posterior tibial & anterior tibial arteries occluded; L runoff - peroneal & posterior tibial arteries occluded, anterior tibial artery appears occluded   LUMBAR LAMINECTOMY/DECOMPRESSION MICRODISCECTOMY N/A 09/12/2014   Procedure: LUMBAR DECOMPRESSION L3-L4, L4-L5, MICRODISCECTOMY L4-L5;  Surgeon: Jene Every, MD;  Location: [facility] ORS;  Service: Orthopedics;  Laterality: N/A;   PERIPHERAL VASCULAR BALLOON ANGIOPLASTY  11/14/2020   Procedure: PERIPHERAL VASCULAR BALLOON ANGIOPLASTY;  Surgeon: Cephus Shelling, MD;  Location: [facility] INVASIVE CV LAB;  Service: Cardiovascular;;   PERIPHERAL VASCULAR BALLOON ANGIOPLASTY Left 01/02/2021   Procedure: PERIPHERAL VASCULAR BALLOON ANGIOPLASTY;  Surgeon: Cephus Shelling, MD;  Location: [facility] INVASIVE CV LAB;  Service: Cardiovascular;  Laterality: Left;  Failed PTA   Pilonidal Cyst removed     TRANSTHORACIC ECHOCARDIOGRAM  11/10/2012   EF 40-45%, mild LVH, mild hypokinesis of anteroseptal myocardium, grade 1 diastolic dysfunction; mild MR & calcifed mitral annulus; LA mild-mod dilated; RA mildly dilated    FAMHx:  Family History  Problem Relation Age of Onset   Cancer Mother    Lung disease Father        & heart disease   Colon polyps Father    Colon cancer Sister    Sudden death  Maternal Grandmother    Anesthesia problems Daughter     SOCHx:   reports that he quit smoking about 11 years ago. His smoking use included cigars. He quit smokeless tobacco use about 11 years ago. He reports that he does not currently use alcohol after a past usage of about 1.0 -  2.0 standard drink of alcohol per week. He reports that he does not use drugs.  ALLERGIES:  Allergies  Allergen Reactions   Entresto [Sacubitril-Valsartan] Other (See Comments)    dizziness   Sacubitril Other (See Comments)     Dizziness    ROS: Pertinent items noted in HPI and remainder of comprehensive ROS otherwise negative.  HOME MEDS: Current Outpatient Medications  Medication Sig Dispense Refill   acetaminophen (TYLENOL) 500 MG tablet Take 500-1,000 mg by mouth every 6 (six) hours as needed (for pain.).     amiodarone (PACERONE) 200 MG tablet Take 2 tablets (400 mg total) TWICE a day for 2 weeks, then take 1 tablet (200 mg total) TWICE a day for 2 weeks, then take 1 tablet (200 mg total) ONCE a day thereafter (Patient taking differently: Take 100 mg by mouth daily. Take 0.5 tablets (100 mg total) once daily.) 90 tablet 2   apixaban (ELIQUIS) 5 MG TABS tablet Take 5 mg by mouth 2 (two) times daily.     carvedilol (COREG) 3.125 MG tablet Take 1 tablet (3.125 mg total) by mouth 2 (two) times daily with a meal. 180 tablet 3   empagliflozin (JARDIANCE) 25 MG TABS tablet Take 1 tablet (25 mg total) by mouth daily. 90 tablet 3   furosemide (LASIX) 40 MG tablet Take 1 1/2 tablet by mouth in the morning and 1 tablet by mouth in the afternoon for 4 days then go back down to 1 tablet by mouth twice a day 180 tablet 3   gabapentin (NEURONTIN) 300 MG capsule Take 600 mg by mouth at bedtime.     metFORMIN (GLUCOPHAGE-XR) 500 MG 24 hr tablet Take 1,000 mg by mouth 2 (two) times daily.     ONETOUCH VERIO test strip 1 each daily.     rosuvastatin (CRESTOR) 10 MG tablet Take 1 tablet (10 mg total) by mouth every other day. 50 tablet 3   No current facility-administered medications for this visit.    LABS/IMAGING: No results found for this or any previous visit (from the past 48 hour(s)).  No results found.  VITALS: BP (!) 114/52 (BP Location: Left Arm, Patient Position: Sitting, Cuff Size:  Normal)   Pulse 80   Ht 6\' 1"  (1.854 m)   Wt 204 lb 9.6 oz (92.8 kg)   SpO2 95%   BMI 26.99 kg/m   EXAM: General appearance: alert and no distress Neck: JVD - 5 cm above sternal notch, no carotid bruit, and thyroid not enlarged, symmetric, no tenderness/mass/nodules Lungs: dullness to percussion bibasilar Heart: regular rate and rhythm Abdomen: soft, non-tender; bowel sounds normal; no masses,  no organomegaly Extremities: edema trace bilateral sock line Pulses: 2+ and symmetric Skin: Skin color, texture, turgor normal. No rashes or lesions Neurologic: Grossly normal Psych: Pleasant  EKG: EKG Interpretation Date/Time:  Friday February 26 2023 08:50:35 EDT Ventricular Rate:  51 PR Interval:  166 QRS Duration:  164 QT Interval:  544 QTC Calculation: 501 R Axis:   197  Text Interpretation: AV dual-paced rhythm Biventricular pacemaker detected When compared with ECG of 16-Feb-2023 13:28, Vent. rate has decreased BY   5 BPM Confirmed by Zoila Shutter (  [id number]) on 02/26/2023 8:55:33 AM    ASSESSMENT: Chronic systolic congestive heart failure, LVEF 28% by cMRI (12/2021) -inferior and lateral wall thinning suggestive of nonviable myocardium, NYHA Class I-II symptoms, s/p Medtronic CRT-D implant (04/2022) Orthostatic hypotension-resolved History of atrial fibrillation, CHADSVASC score of 5, status post DCCV (04/2019), 11/2022 and subsequent ablation (12/2022) Chronic systolic congestive heart failure - EF 40-45%% (2021) -> 30-35% (2024) Coronary artery disease status post three-vessel CABG in 2013 (LIMA to LAD, SVG to PDA and SVG to PLA) -occluded SVG to PDA and SVG to PLA by cath 11/2021 Hypertension-controlled Dyslipidemia Diabetes type 2 - better controlled Mild PVD-with normal ABI Hypotension/upper back pain - resolved Spinal stenosis - s/p surgery Trifascicular block OSA-back on BiPAP  PLAN: 1.   Mr. Oglesbee seems to be feeling better now that he has had A-fib ablation.  He is in  a paced rhythm today.  He is on low-dose amiodarone 100 mg daily.  He still has issues with dizziness and his neurologist suggested that might be due to a combination of amiodarone and gabapentin however his gabapentin was not stopped.  He has had significant weight loss recently and had a Cologuard positive stool.  Since he is more than 1 month out from his A-fib ablation, would be okay to hold Eliquis for an elective colonoscopy.  Will plan follow-up in 6 months or sooner as necessary.  Of note, he feels that he is somewhat deconditioned.  He also does not get much elective exercise at home.  He may benefit from a structured exercise program such as cardiac rehabilitation for his heart failure.  I think he would qualify for maintenance therapy and I will refer him to the program at [facility].  [name], MD, [facility], FACP  [place]  [facility]  Medical Director of the Advanced Lipid Disorders &  Cardiovascular Risk Reduction Clinic Diplomate of the American Board of Clinical Lipidology Attending Cardiologist  Direct Dial: [phone]  Fax: [phone]  Website:  [email or web address] 02/26/2023, 8:55 AM

## 2023-02-26 NOTE — Patient Instructions (Addendum)
Medication Instructions:  Your physician recommends that you continue on your current medications as directed. Please refer to the Current Medication list given to you today.    *If you need a refill on your cardiac medications before your next appointment, please call your pharmacy*     Follow-Up: At [facility], you and your health needs are our priority.  As part of our continuing mission to provide you with exceptional heart care, we have created designated Provider Care Teams.  These Care Teams include your primary Cardiologist (physician) and Advanced Practice Providers (APPs -  Physician Assistants and Nurse Practitioners) who all work together to provide you with the care you need, when you need it.  We recommend signing up for the patient portal called "MyChart".  Sign up information is provided on this After Visit Summary.  MyChart is used to connect with patients for Virtual Visits (Telemedicine).  Patients are able to view lab/test results, encounter notes, upcoming appointments, etc.  Non-urgent messages can be sent to your provider as well.   To learn more about what you can do with MyChart, go to [email or web address].    Your next appointment:   6 month(s)  The format for your next appointment:   In Person  Provider:   [name], MD    Other Instructions Dr. [name] has referred you to the Cardiac Rehab clinic at [facility].

## 2023-03-01 NOTE — Progress Notes (Signed)
Referring Provider: [name], MD Primary Care Physician:  [name], MD Primary GI Physician: Dr. [name]  Chief Complaint  Patient presents with   positive cologuard     Positive cologuard    HPI:   [name] [name] is a 77 y.o. male with a history of A-fib on Eliquis, BPH, CAD s/p CABG in 2013, chronic systolic heart failure, ischemic cardiomyopathy, HTN, stroke, diabetes, presenting today for recent positive cologuard 02/03/23 with PCP.   Labs included in referral 01/07/23. Hgb 11.9 (L), normocytic indices. Cr 1.41 (H). BUN 26 (H).   Prior labs December 08, 2021 with low iron saturation of 13% but ferritin 55. B12 low normal 255, folate 14.9.   Last seen in the office 12/01/2021 at the time of initial consult for anemia, recent positive hemosure.  Noted anemia had been present for about 1 year with most recent Hgb 12.6, normocytic indices.  Patient reported dyspnea with exertion, fatigue, early satiety, change in bowel habits from 1 bowel movement daily to 2 or 3 mushy stools with associated gassiness and flatulence but this was not every day.  No overt GI bleeding or any other significant GI symptoms.  He had lost some weight since starting Jardiance.  Reported his shortness of breath had recently improved since cardiology doubled his Lasix.  He had lost about 17 pounds. Reported family history of sister with colon cancer. Had planned for colonoscopy once cleared by cardiology.    In the interim, he underwent cardiac cath in June 2023 and was found to have occlusion of 2 out of the 3 bypass grafts previously created in 2013. Then found to have LVEF 28% by cMRI in July 2023. No recommendations for revascularization. He underwent CRT-D implant 04/2022. He also underwent DCCV for A fib in June 2024 and subsequently ablation in July 2024.  Most recent Echo 02/19/23 with EF 30-35%, severe pulmonary HTN. At his most recent follow-up with cardiology on 8/30, they noted patient was in need of  colonoscopy due to recent positive cologuard and weight loss. They have stated patient may hold Eliquis to proceed with colonoscopy. No further cardiac testing/intervention planned at this time.   Notably, there was an addendum on Cardiac CT performed on 7/16 that reports  Right hilar and subcarinal lymphadenopathy. This is nonspecific with metastatic disease in the differential diagnosis. Recommend consideration of a chest CT with contrast exclude a lung malignancy not included on today's limited examination of the lungs.   Today: Lost 20 lbs since April. Not trying. Doesn't have a great appetite, has been present for the last several months. Admits to early satiety. No nausea, vomiting, abdominal pain, heartburn symptoms. Couple months ago, he had some stomach rolling and gas but this passed after about 10 days.  Bowels are moving well without brbpr or melena.   Recent max weight was 225 pounds in February 2024.  Currently weighing 203 pounds today.  Per prior notes:  Last colonoscopy 10/10/2013 performed due to rectal bleeding and constipation -normal colonoscopy.   Requesting small volume prep.   Past Medical History:  Diagnosis Date   Arthritis    Knee L - probably   Atrial fibrillation (HCC)    BPH (benign prostatic hyperplasia)    CAD (coronary artery disease) 07/06/2011   CHF (congestive heart failure) (HCC)    Coronary artery disease    Diabetes mellitus    Frequency of urination    Heart failure (HCC)    High cholesterol  History of nuclear stress test 09/2010   dipyridamole; mild perfusion defect due to attenuation with mild superimposed ischemia in apical septal, apical, apical inferior & apical lateral regions; rest LV enlarged in size; prominent gut uptake in infero-apical region; no significant ischemia demonstrated; low risk scan    HNP (herniated nucleus pulposus), lumbar    Hypertension    Ischemic cardiomyopathy    LV dysfunction 07/06/2011   Nocturia     Right bundle branch block    S/P CABG (coronary artery bypass graft) 08/03/2011   x3; LIMA to LAD,, SVG to PDA, SVG to posterolateral branch of RCA; Dr. Wayland Salinas   Sleep apnea    sleep study 10/2010- AHI during total sleep 32.1/hr and during REM 62.3/hr (severe sleep apnea)unable to tolerate c pap   Spinal stenosis of lumbar region    Stroke [facility])    [place]     Past Surgical History:  Procedure Laterality Date   ABDOMINAL AORTOGRAM W/LOWER EXTREMITY Left 11/14/2020   Procedure: ABDOMINAL AORTOGRAM W/LOWER EXTREMITY;  Surgeon: Cephus Shelling, MD;  Location: [facility] INVASIVE CV LAB;  Service: Cardiovascular;  Laterality: Left;   ABDOMINAL AORTOGRAM W/LOWER EXTREMITY N/A 01/02/2021   Procedure: ABDOMINAL AORTOGRAM W/LOWER EXTREMITY;  Surgeon: Cephus Shelling, MD;  Location: [facility] INVASIVE CV LAB;  Service: Cardiovascular;  Laterality: N/A;   AMPUTATION Left 03/20/2021   Procedure: Left foot 5th ray amputation;  Surgeon: Toni Arthurs, MD;  Location[facility] OR;  Service: Orthopedics;  Laterality: Left;   ATRIAL FIBRILLATION ABLATION N/A 01/19/2023   Procedure: ATRIAL FIBRILLATION ABLATION;  Surgeon: [name], MD;  Location: [facility] INVASIVE CV LAB;  Service: Cardiovascular;  Laterality: N/A;   BIV ICD INSERTION CRT-D N/A 04/30/2022   Procedure: BIV ICD INSERTION CRT-D;  Surgeon: [name], MD;  Location: [facility] INVASIVE CV LAB;  Service: Cardiovascular;  Laterality: N/A;   CARDIAC CATHETERIZATION  01/2011   ischemic cardiomyopathy 30-35%, multivessel CAD (Dr. Bishop Limbo)    CARDIAC CATHETERIZATION  09/13/2015   Procedure: Right/Left Heart Cath and Coronary/Graft Angiography;  Surgeon: [name], MD;  Location: [facility] INVASIVE CV LAB;  Service: Cardiovascular;;   CARDIOVERSION N/A 05/16/2019   Procedure: CARDIOVERSION;  Surgeon: [name], MD;  Location: [facility] ENDOSCOPY;  Service: Cardiovascular;  Laterality: N/A;   CARDIOVERSION N/A 10/16/2022   Procedure: CARDIOVERSION;  Surgeon:  Thurmon Fair, MD;  Location: [facility] INVASIVE CV LAB;  Service: Cardiovascular;  Laterality: N/A;   CARDIOVERSION N/A 12/16/2022   Procedure: CARDIOVERSION;  Surgeon: Little Ishikawa, MD;  Location: [facility] INVASIVE CV LAB;  Service: Cardiovascular;  Laterality: N/A;   Colonscopy     CORONARY ARTERY BYPASS GRAFT  08/03/2011   Procedure: CORONARY ARTERY BYPASS GRAFTING (CABG);  Surgeon: Alleen Borne, MD;  Location: [facility] OR;  Service: Open Heart Surgery;  Laterality: N/A;  CABG times three using right saphenous vein and left mammary artery usisng endoscope.   LEFT HEART CATH AND CORS/GRAFTS ANGIOGRAPHY N/A 12/15/2021   Procedure: LEFT HEART CATH AND CORS/GRAFTS ANGIOGRAPHY;  Surgeon: Tonny Bollman, MD;  Location: [facility] INVASIVE CV LAB;  Service: Cardiovascular;  Laterality: N/A;   LEFT HEART CATHETERIZATION WITH CORONARY/GRAFT ANGIOGRAM N/A 09/20/2013   Procedure: LEFT HEART CATHETERIZATION WITH Isabel Caprice;  Surgeon: [name], MD;  Location: [facility] CATH LAB;  Service: Cardiovascular;  Laterality: N/A;   Lower Extremity Arterial Doppler  03/16/2013   bilat ABIs demonstated normal values; R runoff - posterior tibial & anterior tibial arteries occluded; L runoff - peroneal &  posterior tibial arteries occluded, anterior tibial artery appears occluded   LUMBAR LAMINECTOMY/DECOMPRESSION MICRODISCECTOMY N/A 09/12/2014   Procedure: LUMBAR DECOMPRESSION L3-L4, L4-L5, MICRODISCECTOMY L4-L5;  Surgeon: Jene Every, MD;  Location: [facility] ORS;  Service: Orthopedics;  Laterality: N/A;   PERIPHERAL VASCULAR BALLOON ANGIOPLASTY  11/14/2020   Procedure: PERIPHERAL VASCULAR BALLOON ANGIOPLASTY;  Surgeon: Cephus Shelling, MD;  Location: [facility] INVASIVE CV LAB;  Service: Cardiovascular;;   PERIPHERAL VASCULAR BALLOON ANGIOPLASTY Left 01/02/2021   Procedure: PERIPHERAL VASCULAR BALLOON ANGIOPLASTY;  Surgeon: Cephus Shelling, MD;  Location: [facility] INVASIVE CV LAB;  Service: Cardiovascular;  Laterality: Left;  Failed  PTA   Pilonidal Cyst removed     TRANSTHORACIC ECHOCARDIOGRAM  11/10/2012   EF 40-45%, mild LVH, mild hypokinesis of anteroseptal myocardium, grade 1 diastolic dysfunction; mild MR & calcifed mitral annulus; LA mild-mod dilated; RA mildly dilated    Current Outpatient Medications  Medication Sig Dispense Refill   acetaminophen (TYLENOL) 500 MG tablet Take 500-1,000 mg by mouth every 6 (six) hours as needed (for pain.).     amiodarone (PACERONE) 200 MG tablet Take 2 tablets (400 mg total) TWICE a day for 2 weeks, then take 1 tablet (200 mg total) TWICE a day for 2 weeks, then take 1 tablet (200 mg total) ONCE a day thereafter (Patient taking differently: Take 100 mg by mouth daily. Take 0.5 tablets (100 mg total) once daily.) 90 tablet 2   apixaban (ELIQUIS) 5 MG TABS tablet Take 5 mg by mouth 2 (two) times daily.     carvedilol (COREG) 3.125 MG tablet Take 1 tablet (3.125 mg total) by mouth 2 (two) times daily with a meal. 180 tablet 3   empagliflozin (JARDIANCE) 25 MG TABS tablet Take 1 tablet (25 mg total) by mouth daily. 90 tablet 3   furosemide (LASIX) 40 MG tablet Take 1 1/2 tablet by mouth in the morning and 1 tablet by mouth in the afternoon for 4 days then go back down to 1 tablet by mouth twice a day 180 tablet 3   gabapentin (NEURONTIN) 300 MG capsule Take 600 mg by mouth at bedtime.     metFORMIN (GLUCOPHAGE-XR) 500 MG 24 hr tablet Take 1,000 mg by mouth 2 (two) times daily.     ONETOUCH VERIO test strip 1 each daily.     rosuvastatin (CRESTOR) 10 MG tablet Take 1 tablet (10 mg total) by mouth every other day. 50 tablet 3   No current facility-administered medications for this visit.    Allergies as of 03/03/2023 - Review Complete 03/03/2023  Allergen Reaction Noted   Entresto [sacubitril-valsartan] Other (See Comments) 02/25/2017   Sacubitril Other (See Comments) 10/05/2017    Family History  Problem Relation Age of Onset   Cancer Mother    Lung disease Father        &  heart disease   Colon polyps Father    Colon cancer Sister    Sudden death Maternal Grandmother    Anesthesia problems Daughter     Social History   Socioeconomic History   Marital status: Widowed    Spouse name: Not on file   Number of children: 1   Years of education: Not on file   Highest education level: Not on file  Occupational History   Occupation: real Psychologist, occupational  Tobacco Use   Smoking status: Former    Types: Cigars    Quit date: 09/07/2011    Years since quitting: 11.5   Smokeless tobacco: Former  Quit date: 02/28/2012   Tobacco comments:    Former smoker 02/16/23  Vaping Use   Vaping status: Never Used  Substance and Sexual Activity   Alcohol use: Not Currently    Alcohol/week: 1.0 - 2.0 standard drink of alcohol    Types: 1 - 2 Cans of beer per week   Drug use: No   Sexual activity: Not on file  Other Topics Concern   Not on file  Social History Narrative   ** Merged History Encounter **       Social Determinants of Health   Financial Resource Strain: Not on file  Food Insecurity: No Food Insecurity (11/09/2022)   Hunger Vital Sign    Worried About Running Out of Food in the Last Year: Never true    Ran Out of Food in the Last Year: Never true  Transportation Needs: No Transportation Needs (11/09/2022)   PRAPARE - Administrator, Civil Service (Medical): No    Lack of Transportation (Non-Medical): No  Physical Activity: Low Risk  (08/11/2022)   [facility]   PCARE Exercise SDOH    Exercise: Active Lifestyle Only    PCare Exercise SDOH: Not on file    PCare Exercise SDOH: Not on file  Stress: Not on file  Social Connections: Not on file    Review of Systems: Gen: Denies fever, chills, cold or flulike symptoms, presyncope, syncope. CV: Denies chest pain, palpitations. Resp: Denies dyspnea at rest, cough. GI: See HPI Heme: See HPI  Physical Exam: BP (!) 117/50 (BP  Location: Right Arm, Patient Position: Sitting, Cuff Size: Normal)   Pulse 60   Temp 97.6 F (36.4 C) (Temporal)   Ht 6\' 1"  (1.854 m)   Wt 203 lb 6.4 oz (92.3 kg)   SpO2 97%   BMI 26.84 kg/m  General:   Alert and oriented. No distress noted. Pleasant and cooperative.  Head:  Normocephalic and atraumatic. Eyes:  Conjuctiva clear without scleral icterus. Heart:  S1, S2 present without murmurs appreciated. Lungs:  Clear to auscultation bilaterally. No wheezes, rales, or rhonchi. No distress.  Abdomen:  +BS, soft, non-tender and non-distended. No rebound or guarding. No HSM or masses noted. Msk:  Symmetrical without gross deformities. Normal posture. Extremities:  Without edema. Neurologic:  Alert and  oriented x4 Psych:  Normal mood and affect.    Assessment:  77 y.o. male with a history of A-fib on Eliquis, BPH, CAD s/p CABG in 2013, chronic systolic heart failure, ischemic cardiomyopathy, HTN, stroke, diabetes, presenting today for recent positive cologuard 02/03/23, previously seen in June 2023 for heme positive stool. Patient also noting early satiety, unintentional weight loss.   Per chart review, patient has had persistent, mild anemia with hemoglobin in the 11-12 range since October 2023.  Most recent labs in July with hemoglobin 11.9, normocytic indices.  He does have chronic kidney disease which could be contributing, but prior labs in June 2023 note low iron saturation of 13%, low normal B12 of 255.  Also with heme positive stool recently along with positive Cologuard.  Additionally, he reports early satiety and has had about a 20 pound unintentional weight loss since February though some of this was likely water weight as he previously reported weight loss after doubling Lasix.  Denies overt GI bleeding or any other significant GI symptoms.  Per prior office visit note, last colonoscopy was in April 2015 was normal exam.   Regardless, he needs  his upper and lower GI track to be  evaluated to rule out gastritis, duodenitis, PUD, H. pylori, AVMs, polyps, malignancy.  We will arrange EGD and colonoscopy in the near future.  Additionally, I will update CBC and iron panel.   I also noted addendum on cardiac CT performed 01/12/2023 that noted right hilar and subcarinal lymphadenopathy with recommendations for chest CT with contrast to exclude lung malignancy.  I will go ahead and order this for him today as patient reports he was unaware of this.    Plan:  CBC, iron panel CT chest with contrast Proceed with upper endoscopy + colonoscopy with propofol by Dr. [name] in near future (once reviewed with anesthesia). The risks, benefits, and alternatives have been discussed with the patient in detail. The patient states understanding and desires to proceed.  ASA 3 We will get approval from cardiology to hold Eliquis x 2 days prior. Hold Jardiance x 3 days prior. One half dose of metformin 1 day prior. No morning diabetes medications day of procedures. Follow-up after procedures.    Ermalinda Memos, PA-C [facility] 03/03/2023

## 2023-03-03 ENCOUNTER — Encounter: Payer: Self-pay | Admitting: Gastroenterology

## 2023-03-03 ENCOUNTER — Ambulatory Visit (INDEPENDENT_AMBULATORY_CARE_PROVIDER_SITE_OTHER): Payer: Medicare Other | Admitting: Gastroenterology

## 2023-03-03 VITALS — BP 117/50 | HR 60 | Temp 97.6°F | Ht 73.0 in | Wt 203.4 lb

## 2023-03-03 DIAGNOSIS — R6881 Early satiety: Secondary | ICD-10-CM

## 2023-03-03 DIAGNOSIS — R590 Localized enlarged lymph nodes: Secondary | ICD-10-CM

## 2023-03-03 DIAGNOSIS — R195 Other fecal abnormalities: Secondary | ICD-10-CM | POA: Diagnosis not present

## 2023-03-03 DIAGNOSIS — R634 Abnormal weight loss: Secondary | ICD-10-CM

## 2023-03-03 DIAGNOSIS — D649 Anemia, unspecified: Secondary | ICD-10-CM

## 2023-03-03 NOTE — Patient Instructions (Signed)
I am going to reach out to anesthesia staff at [facility] to see if you can have your upper endoscopy and colonoscopy performed locally.  Otherwise, we will need to refer you to [facility] to have procedures completed.  We are going to schedule you for a CT of your chest to further evaluate the enlarged lymph nodes that were noted on cardiac CT completed in July.  It was nice to meet you today!  Ermalinda Memos, PA-C [facility]

## 2023-03-04 ENCOUNTER — Telehealth: Payer: Self-pay | Admitting: Gastroenterology

## 2023-03-04 ENCOUNTER — Ambulatory Visit (HOSPITAL_COMMUNITY)
Admission: RE | Admit: 2023-03-04 | Discharge: 2023-03-04 | Disposition: A | Discharge status: Home / Self Care | Payer: Medicare Other | Source: Ambulatory Visit | Attending: Gastroenterology | Admitting: Gastroenterology

## 2023-03-04 DIAGNOSIS — E041 Nontoxic single thyroid nodule: Secondary | ICD-10-CM | POA: Diagnosis not present

## 2023-03-04 DIAGNOSIS — R590 Localized enlarged lymph nodes: Secondary | ICD-10-CM | POA: Diagnosis not present

## 2023-03-04 LAB — POCT I-STAT CREATININE: Creatinine, Ser: 1.1 mg/dL (ref 0.61–1.24)

## 2023-03-04 MED ORDER — IOHEXOL 300 MG/ML  SOLN
75.0000 mL | Freq: Once | INTRAMUSCULAR | Status: AC | PRN
  Administered 2023-03-04: 75 mL via INTRAVENOUS

## 2023-03-04 NOTE — Telephone Encounter (Signed)
I have reviewed patient's case with Dr. Alva Garnet.  We can proceed with procedures at [facility].  Mindy/Tammy: Please arrange EGD and colonoscopy with Dr. [name]. ASA 3/4 DX: Unintentional weight loss, early satiety, positive Cologuard Please reach cardiology to get approval to hold Eliquis for 2 days prior to procedure. Hold Jardiance x 3 days prior to procedures. Take one half dose of metformin day prior to procedures. No morning diabetes medications morning of procedures.

## 2023-03-05 ENCOUNTER — Telehealth: Payer: Self-pay | Admitting: *Deleted

## 2023-03-05 NOTE — Telephone Encounter (Signed)
Pharmacy please advise on holding Eliquis prior to Colonoscopy and EDG scheduled for TBD. Thank you.

## 2023-03-05 NOTE — Telephone Encounter (Signed)
Clearance sent 

## 2023-03-05 NOTE — Telephone Encounter (Signed)
   Patient Name: [name] [name]  DOB: 08-10-1945 MRN: [id number]  Primary Cardiologist: [name], MD  Chart reviewed as part of pre-operative protocol coverage. Given past medical history and time since last visit, based on ACC/AHA guidelines, [name] [name] is at acceptable risk for the planned procedure without further cardiovascular testing.   CHA2DS2-VASc Score = 6   This indicates a 9.7% annual risk of stroke. The patient's score is based upon: CHF History: 1 HTN History: 1 Diabetes History: 1 Stroke History: 0 Vascular Disease History: 1 Age Score: 2 Gender Score: 0     CrCl 58 mL/min  Platelet count 232K   Per office protocol, patient can hold Eliquis for 2 days prior to procedure.    The patient was advised that if he develops new symptoms prior to surgery to contact our office to arrange for a follow-up visit, and he verbalized understanding.  I will route this recommendation to the requesting party via Epic fax function and remove from pre-op pool.  Please call with questions.  Joni Reining, NP 03/05/2023, 11:20 AM

## 2023-03-05 NOTE — Telephone Encounter (Signed)
Patient with diagnosis of PAF on Eliquis for anticoagulation.    Procedure: Colonoscopy/EGD  Date of procedure: TBD   CHA2DS2-VASc Score = 6   This indicates a 9.7% annual risk of stroke. The patient's score is based upon: CHF History: 1 HTN History: 1 Diabetes History: 1 Stroke History: 0 Vascular Disease History: 1 Age Score: 2 Gender Score: 0   CrCl 58 mL/min  Platelet count 232K    Per office protocol, patient can hold Eliquis for 2 days prior to procedure.     **This guidance is not considered finalized until pre-operative APP has relayed final recommendations.**

## 2023-03-05 NOTE — Telephone Encounter (Signed)
  Request for patient to stop medication prior to procedure or is needing cleareance  03/05/23  [name] [name] [birth year]  What type of surgery is being performed? Colonoscopy/EGD  When is surgery scheduled? TBD  What type of clearance is required (medical or pharmacy to hold medication or both? Medication  Are there any medications that need to be held prior to surgery and how long? to hold Eliquis for 2 days prior to procedure   Name of physician performing surgery?  Dr. Gavin Potters [facility]enter[facility] at [facility] Phone: [phone] Fax: [phone]  Anethesia type (none, local, MAC, general)? MAC

## 2023-03-07 ENCOUNTER — Encounter: Payer: Self-pay | Admitting: Gastroenterology

## 2023-03-07 DIAGNOSIS — R6881 Early satiety: Secondary | ICD-10-CM | POA: Insufficient documentation

## 2023-03-07 DIAGNOSIS — R195 Other fecal abnormalities: Secondary | ICD-10-CM | POA: Insufficient documentation

## 2023-03-07 DIAGNOSIS — R634 Abnormal weight loss: Secondary | ICD-10-CM | POA: Insufficient documentation

## 2023-03-07 DIAGNOSIS — D649 Anemia, unspecified: Secondary | ICD-10-CM | POA: Insufficient documentation

## 2023-03-09 ENCOUNTER — Encounter (HOSPITAL_COMMUNITY): Payer: Self-pay

## 2023-03-10 ENCOUNTER — Encounter (INDEPENDENT_AMBULATORY_CARE_PROVIDER_SITE_OTHER): Payer: Medicare Other

## 2023-03-10 DIAGNOSIS — R2681 Unsteadiness on feet: Secondary | ICD-10-CM | POA: Diagnosis not present

## 2023-03-10 DIAGNOSIS — R262 Difficulty in walking, not elsewhere classified: Secondary | ICD-10-CM | POA: Diagnosis not present

## 2023-03-10 DIAGNOSIS — Z95810 Presence of automatic (implantable) cardiac defibrillator: Secondary | ICD-10-CM

## 2023-03-10 DIAGNOSIS — I5022 Chronic systolic (congestive) heart failure: Secondary | ICD-10-CM | POA: Diagnosis not present

## 2023-03-10 DIAGNOSIS — S065X0S Traumatic subdural hemorrhage without loss of consciousness, sequela: Secondary | ICD-10-CM | POA: Diagnosis not present

## 2023-03-10 DIAGNOSIS — M6281 Muscle weakness (generalized): Secondary | ICD-10-CM | POA: Diagnosis not present

## 2023-03-12 ENCOUNTER — Encounter (HOSPITAL_COMMUNITY)
Admission: RE | Admit: 2023-03-12 | Discharge: 2023-03-12 | Disposition: A | Discharge status: Home / Self Care | Payer: Medicare Other | Source: Ambulatory Visit | Attending: Internal Medicine | Admitting: Internal Medicine

## 2023-03-12 DIAGNOSIS — I5022 Chronic systolic (congestive) heart failure: Secondary | ICD-10-CM

## 2023-03-12 DIAGNOSIS — S065X0S Traumatic subdural hemorrhage without loss of consciousness, sequela: Secondary | ICD-10-CM | POA: Diagnosis not present

## 2023-03-12 DIAGNOSIS — R2681 Unsteadiness on feet: Secondary | ICD-10-CM | POA: Diagnosis not present

## 2023-03-12 DIAGNOSIS — M6281 Muscle weakness (generalized): Secondary | ICD-10-CM | POA: Diagnosis not present

## 2023-03-12 DIAGNOSIS — R262 Difficulty in walking, not elsewhere classified: Secondary | ICD-10-CM | POA: Diagnosis not present

## 2023-03-12 NOTE — Progress Notes (Signed)
EPIC Encounter for ICM Monitoring  Patient Name: [name] [name] is a 77 y.o. male Date: 03/12/2023 Primary Care Physican: [name], MD Primary Cardiologist: [facility]        Electrophysiologist: [name] Bi-V Pacing: 99.0%         09/21/2022 Weight: 213 lbs 10/06/2022 Weight: 219 lbs  11/17/2022 Weight: 212.5 lbs 12/22/2022 Weight: 211 lbs 01/13/2023 Weight: 209 lbs   Clinical Status (19-Jan-2023 to 01-Feb-2023) Time in AT/AF  0.0 hr/day (0.0%)                                                          Spoke with patient and heart failure questions reviewed.  Transmission results reviewed.  Pt reports feeling well and asymptomatic for fluid accumulaiton   Optivol thoracic impedance suggesting normal fluid levels.    Prescribed:  Furosemide 40 mg take 1 tablet(s) (40 mg total) by mouth every morning and at bedtime.  Dr [name] advised to hold Furosemide if BP is <120   Labs: 01/05/2023 Creatinine 1.27, BUN 22, Potassium 4.2, Sodium 142, GFR 58 12/02/2022 Creatinine 1.20, BUN 17, Potassium 4.0, Sodium 137, GFR >60  11/11/2022 Creatinine 1.18, BUN 20, Potassium 3.6, Sodium 134, GFR >60  11/10/2022 Creatinine 1.25, BUN 21, Potassium 3.8, Sodium 139, GFR 59  11/09/2022 Creatinine 1.39, BUN 26, Potassium 3.4, Sodium 136, GFR 52 10/12/2022 Creatinine 1.04, BUN 17, Potassium 4.8, Sodium 144  10/02/2022 Creatinine 1.13, BUN 19, Potassium 4.3, Sodium 141, GFR 67  09/07/2022 Creatinine 0.98, BUN 20, Potassium 4.2, Sodium 144, GFR 79  A complete set of results can be found in Results Review.   Recommendations:   Encouraged to call if experiencing any fluid symptoms.    Follow-up plan: ICM clinic phone appointment on 04/12/2023.   91 day device clinic remote transmission 05/03/2023.     EP/Cardiology Office Visits:   04/21/2023 with Dr [name].    Copy of ICM check sent to Dr. [name].     3 month ICM trend: 03/08/2023.    12-14 Month ICM trend:     Karie Soda, RN 03/12/2023 3:16  PM

## 2023-03-15 ENCOUNTER — Encounter: Payer: Self-pay | Admitting: *Deleted

## 2023-03-15 ENCOUNTER — Telehealth: Payer: Self-pay | Admitting: *Deleted

## 2023-03-15 MED ORDER — NA SULFATE-K SULFATE-MG SULF 17.5-3.13-1.6 GM/177ML PO SOLN: ORAL | 0 refills | Status: DC

## 2023-03-15 NOTE — Telephone Encounter (Signed)
His CT has not been read yet by radiology.  Soon as I receive the final report, I will let him know.  Regarding his procedures, I assume we are waiting on Dr. [name] schedule.  I am routing to the schedulers for further input.

## 2023-03-15 NOTE — Telephone Encounter (Signed)
Pt called and would like results of CT scan and would like to know when they are going to schedule his procedure.

## 2023-03-15 NOTE — Addendum Note (Signed)
Addended by: Armstead Peaks on: 03/15/2023 03:29 PM   Modules accepted: Orders

## 2023-03-15 NOTE — Telephone Encounter (Signed)
Pt sent mychart message. Scheduled for 10/16. Instructions/pre-op sent via mychart. Rx sent to pharmacy.

## 2023-03-16 ENCOUNTER — Encounter: Payer: Self-pay | Admitting: *Deleted

## 2023-03-17 ENCOUNTER — Encounter (HOSPITAL_COMMUNITY)
Admission: RE | Admit: 2023-03-17 | Discharge: 2023-03-17 | Disposition: A | Discharge status: Home / Self Care | Payer: Medicare Other | Source: Ambulatory Visit | Attending: Internal Medicine | Admitting: Internal Medicine

## 2023-03-17 ENCOUNTER — Encounter: Payer: Self-pay | Admitting: *Deleted

## 2023-03-17 VITALS — Ht 72.5 in | Wt 205.2 lb

## 2023-03-17 DIAGNOSIS — Z95810 Presence of automatic (implantable) cardiac defibrillator: Secondary | ICD-10-CM | POA: Diagnosis not present

## 2023-03-17 DIAGNOSIS — I5022 Chronic systolic (congestive) heart failure: Secondary | ICD-10-CM | POA: Diagnosis not present

## 2023-03-17 NOTE — Telephone Encounter (Signed)
Sent pt a MyChart message

## 2023-03-17 NOTE — Patient Instructions (Signed)
Patient Instructions  Patient Details  Name: [name] [name] MRN: [id number] Date of Birth: 1945/10/30 Referring Provider:  [name], MD  Below are your personal goals for exercise, nutrition, and risk factors. Our goal is to help you stay on track towards obtaining and maintaining these goals. We will be discussing your progress on these goals with you throughout the program.  Initial Exercise Prescription:  Initial Exercise Prescription - 03/17/23 1500       Date of Initial Exercise RX and Referring Provider   Date 03/17/23    Referring Provider Zoila Shutter MD      Oxygen   Maintain Oxygen Saturation 88% or higher      NuStep   Level 1    SPM 80    Minutes 15    METs 2      REL-XR   Level 1    Speed 50    Minutes 15    METs 2      Prescription Details   Frequency (times per week) 2    Duration Progress to 30 minutes of continuous aerobic without signs/symptoms of physical distress      Intensity   THRR 40-80% of Max Heartrate 87-124    Ratings of Perceived Exertion 11-13    Perceived Dyspnea 0-4      Progression   Progression Continue to progress workloads to maintain intensity without signs/symptoms of physical distress.      Resistance Training   Training Prescription Yes    Weight 4 lb    Reps 10-15             Exercise Goals: Frequency: Be able to perform aerobic exercise two to three times per week in program working toward 2-5 days per week of home exercise.  Intensity: Work with a perceived exertion of 11 (fairly light) - 15 (hard) while following your exercise prescription.  We will make changes to your prescription with you as you progress through the program.   Duration: Be able to do 30 to 45 minutes of continuous aerobic exercise in addition to a 5 minute warm-up and a 5 minute cool-down routine.   Nutrition Goals: Your personal nutrition goals will be established when you do your nutrition analysis with the dietician.  The  following are general nutrition guidelines to follow: Cholesterol < 200mg /day Sodium < 1500mg /day Fiber: Men over 50 yrs - 30 grams per day  Personal Goals:  Personal Goals and Risk Factors at Admission - 03/17/23 1551       Core Components/Risk Factors/Patient Goals on Admission    Weight Management Yes;Weight Maintenance;Weight Loss    Intervention Weight Management: Develop a combined nutrition and exercise program designed to reach desired caloric intake, while maintaining appropriate intake of nutrient and fiber, sodium and fats, and appropriate energy expenditure required for the weight goal.;Weight Management: Provide education and appropriate resources to help participant work on and attain dietary goals.    Admit Weight 205 lb 3.2 oz (93.1 kg)    Goal Weight: Short Term 200 lb (90.7 kg)    Goal Weight: Long Term 198 lb (89.8 kg)    Expected Outcomes Short Term: Continue to assess and modify interventions until short term weight is achieved;Long Term: Adherence to nutrition and physical activity/exercise program aimed toward attainment of established weight goal;Weight Maintenance: Understanding of the daily nutrition guidelines, which includes 25-35% calories from fat, 7% or less cal from saturated fats, less than 200mg  cholesterol, less than 1.5gm of sodium, & 5  or more servings of fruits and vegetables daily;Weight Loss: Understanding of general recommendations for a balanced deficit meal plan, which promotes 1-2 lb weight loss per week and includes a negative energy balance of [phone] kcal/d;Understanding recommendations for meals to include 15-35% energy as protein, 25-35% energy from fat, 35-60% energy from carbohydrates, less than 200mg  of dietary cholesterol, 20-35 gm of total fiber daily;Understanding of distribution of calorie intake throughout the day with the consumption of 4-5 meals/snacks    Diabetes Yes    Intervention Provide education about signs/symptoms and action to take  for hypo/hyperglycemia.;Provide education about proper nutrition, including hydration, and aerobic/resistive exercise prescription along with prescribed medications to achieve blood glucose in normal ranges: Fasting glucose 65-99 mg/dL    Expected Outcomes Short Term: Participant verbalizes understanding of the signs/symptoms and immediate care of hyper/hypoglycemia, proper foot care and importance of medication, aerobic/resistive exercise and nutrition plan for blood glucose control.;Long Term: Attainment of HbA1C < 7%.    Heart Failure Yes    Intervention Provide a combined exercise and nutrition program that is supplemented with education, support and counseling about heart failure. Directed toward relieving symptoms such as shortness of breath, decreased exercise tolerance, and extremity edema.    Expected Outcomes Improve functional capacity of life;Short term: Attendance in program 2-3 days a week with increased exercise capacity. Reported lower sodium intake. Reported increased fruit and vegetable intake. Reports medication compliance.;Short term: Daily weights obtained and reported for increase. Utilizing diuretic protocols set by physician.;Long term: Adoption of self-care skills and reduction of barriers for early signs and symptoms recognition and intervention leading to self-care maintenance.    Hypertension Yes    Intervention Provide education on lifestyle modifcations including regular physical activity/exercise, weight management, moderate sodium restriction and increased consumption of fresh fruit, vegetables, and low fat dairy, alcohol moderation, and smoking cessation.;Monitor prescription use compliance.    Expected Outcomes Short Term: Continued assessment and intervention until BP is < 140/39mm HG in hypertensive participants. < 130/65mm HG in hypertensive participants with diabetes, heart failure or chronic kidney disease.;Long Term: Maintenance of blood pressure at goal levels.     Lipids Yes    Intervention Provide education and support for participant on nutrition & aerobic/resistive exercise along with prescribed medications to achieve LDL 70mg , HDL >40mg .    Expected Outcomes Short Term: Participant states understanding of desired cholesterol values and is compliant with medications prescribed. Participant is following exercise prescription and nutrition guidelines.;Long Term: Cholesterol controlled with medications as prescribed, with individualized exercise RX and with personalized nutrition plan. Value goals: LDL < 70mg , HDL > 40 mg.             Tobacco Use Initial Evaluation: Social History   Tobacco Use  Smoking Status Former   Types: Cigars   Quit date: 09/07/2011   Years since quitting: 11.5  Smokeless Tobacco Former   Quit date: 02/28/2012  Tobacco Comments   Former smoker 02/16/23    Exercise Goals and Review:  Exercise Goals     Row Name 03/17/23 1550             Exercise Goals   Increase Physical Activity Yes       Intervention Provide advice, education, support and counseling about physical activity/exercise needs.;Develop an individualized exercise prescription for aerobic and resistive training based on initial evaluation findings, risk stratification, comorbidities and participant's personal goals.       Expected Outcomes Short Term: Attend rehab on a regular basis to increase amount of physical  activity.;Long Term: Add in home exercise to make exercise part of routine and to increase amount of physical activity.;Long Term: Exercising regularly at least 3-5 days a week.       Increase Strength and Stamina Yes       Intervention Provide advice, education, support and counseling about physical activity/exercise needs.;Develop an individualized exercise prescription for aerobic and resistive training based on initial evaluation findings, risk stratification, comorbidities and participant's personal goals.       Expected Outcomes Short Term:  Increase workloads from initial exercise prescription for resistance, speed, and METs.;Short Term: Perform resistance training exercises routinely during rehab and add in resistance training at home;Long Term: Improve cardiorespiratory fitness, muscular endurance and strength as measured by increased METs and functional capacity ( )       Able to understand and use rate of perceived exertion (RPE) scale Yes       Intervention Provide education and explanation on how to use RPE scale       Expected Outcomes Short Term: Able to use RPE daily in rehab to express subjective intensity level;Long Term:  Able to use RPE to guide intensity level when exercising independently       Able to understand and use Dyspnea scale Yes       Intervention Provide education and explanation on how to use Dyspnea scale       Expected Outcomes Short Term: Able to use Dyspnea scale daily in rehab to express subjective sense of shortness of breath during exertion;Long Term: Able to use Dyspnea scale to guide intensity level when exercising independently       Knowledge and understanding of Target Heart Rate Range (THRR) Yes       Intervention Provide education and explanation of THRR including how the numbers were predicted and where they are located for reference       Expected Outcomes Short Term: Able to state/look up THRR;Long Term: Able to use THRR to govern intensity when exercising independently;Short Term: Able to use daily as guideline for intensity in rehab       Able to check pulse independently Yes       Intervention Provide education and demonstration on how to check pulse in carotid and radial arteries.;Review the importance of being able to check your own pulse for safety during independent exercise       Expected Outcomes Short Term: Able to explain why pulse checking is important during independent exercise;Long Term: Able to check pulse independently and accurately       Understanding of Exercise  Prescription Yes       Intervention Provide education, explanation, and written materials on patient's individual exercise prescription       Expected Outcomes Short Term: Able to explain program exercise prescription;Long Term: Able to explain home exercise prescription to exercise independently                Copy of goals given to participant.

## 2023-03-17 NOTE — Telephone Encounter (Signed)
This has already been addressed. See prior patient messages. Nothing further needed at this time.

## 2023-03-17 NOTE — Telephone Encounter (Signed)
Noted  

## 2023-03-17 NOTE — Progress Notes (Signed)
Cardiac Individual Treatment Plan  Patient Details  Name: [name] [name] MRN: [id number] Date of Birth: [birth year] Referring Provider:   Flowsheet Row CARDIAC REHAB PHASE II ORIENTATION from 03/17/2023 in [facility] CARDIAC REHABILITATION  Referring Provider Zoila Shutter MD       Initial Encounter Date:  Flowsheet Row CARDIAC REHAB PHASE II ORIENTATION from 03/17/2023 in [facility] CARDIAC REHABILITATION  Date 03/17/23       Visit Diagnosis: Heart failure, chronic systolic (HCC)  Patient's Home Medications on Admission:  Current Outpatient Medications:    acetaminophen (TYLENOL) 500 MG tablet, Take 500-1,000 mg by mouth every 6 (six) hours as needed (for pain.)., Disp: , Rfl:    amiodarone (PACERONE) 200 MG tablet, Take 2 tablets (400 mg total) TWICE a day for 2 weeks, then take 1 tablet (200 mg total) TWICE a day for 2 weeks, then take 1 tablet (200 mg total) ONCE a day thereafter (Patient taking differently: Take 100 mg by mouth daily. Take 0.5 tablets (100 mg total) once daily.), Disp: 90 tablet, Rfl: 2   apixaban (ELIQUIS) 5 MG TABS tablet, Take 5 mg by mouth 2 (two) times daily., Disp: , Rfl:    carvedilol (COREG) 3.125 MG tablet, Take 1 tablet (3.125 mg total) by mouth 2 (two) times daily with a meal., Disp: 180 tablet, Rfl: 3   empagliflozin (JARDIANCE) 25 MG TABS tablet, Take 1 tablet (25 mg total) by mouth daily., Disp: 90 tablet, Rfl: 3   furosemide (LASIX) 40 MG tablet, Take 1 1/2 tablet by mouth in the morning and 1 tablet by mouth in the afternoon for 4 days then go back down to 1 tablet by mouth twice a day (Patient taking differently: Take 40 mg by mouth daily. Take 1  tablet by mouth in the morning and 1 tablet by mouth in the afternoon as needed.), Disp: 180 tablet, Rfl: 3   gabapentin (NEURONTIN) 300 MG capsule, Take 600 mg by mouth at bedtime., Disp: , Rfl:    metFORMIN (GLUCOPHAGE-XR) 500 MG 24 hr tablet, Take 1,000 mg by mouth 2 (two) times daily., Disp: , Rfl:     Na Sulfate-K Sulfate-Mg Sulf 17.5-3.13-1.6 GM/177ML SOLN, As directed, Disp: 354 mL, Rfl: 0   ONETOUCH VERIO test strip, 1 each daily., Disp: , Rfl:    rosuvastatin (CRESTOR) 10 MG tablet, Take 1 tablet (10 mg total) by mouth every other day., Disp: 50 tablet, Rfl: 3  Past Medical History: Past Medical History:  Diagnosis Date   Arthritis    Knee L - probably   Atrial fibrillation (HCC)    BPH (benign prostatic hyperplasia)    CAD (coronary artery disease) 07/06/2011   CHF (congestive heart failure) (HCC)    Coronary artery disease    Diabetes mellitus    Frequency of urination    Heart failure (HCC)    High cholesterol    History of nuclear stress test 09/2010   dipyridamole; mild perfusion defect due to attenuation with mild superimposed ischemia in apical septal, apical, apical inferior & apical lateral regions; rest LV enlarged in size; prominent gut uptake in infero-apical region; no significant ischemia demonstrated; low risk scan    HNP (herniated nucleus pulposus), lumbar    Hypertension    Ischemic cardiomyopathy    LV dysfunction 07/06/2011   Nocturia    Right bundle branch block    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 Wayland Salinas   Sleep apnea    sleep study 10/2010- AHI during total sleep 32.1/hr and during REM 62.3/hr (severe sleep apnea)unable to tolerate c pap   Spinal stenosis of lumbar region    Stroke (HCC)    [place]     Tobacco Use: Social History   Tobacco Use  Smoking Status Former   Types: Cigars   Quit date: 09/07/2011   Years since quitting: 11.5  Smokeless Tobacco Former   Quit date: 02/28/2012  Tobacco Comments   Former smoker 02/16/23    Labs: Review Flowsheet  More data exists      Latest Ref Rng & Units 10/17/2020 11/14/2020 01/02/2021 08/14/2021 12/10/2021  Labs for ITP Cardiac and Pulmonary Rehab  Cholestrol 100 - 199 mg/dL 409  - - 811  98   LDL (calc)  0 - 99 mg/dL 914  - - 782  46   HDL-C >39 mg/dL 36  - - 38  35   Trlycerides 0 - 149 mg/dL 97  - - 86  88   Hemoglobin A1c 4.8 - 5.6 % 7.0  - - - -  TCO2 22 - 32 mmol/L - 26  25  - -    Details            Capillary Blood Glucose: Lab Results  Component Value Date   GLUCAP 161 (H) 01/19/2023   GLUCAP 136 (H) 01/19/2023   GLUCAP 81 12/16/2022   GLUCAP 169 (H) 11/11/2022   GLUCAP 221 (H) 11/10/2022     Exercise Target Goals: Exercise Program Goal: Individual exercise prescription set using results from initial 6 min walk test and THRR while considering  patient's activity barriers and safety.   Exercise Prescription Goal: Starting with aerobic activity 30 plus minutes a day, 3 days per week for initial exercise prescription. Provide home exercise prescription and guidelines that participant acknowledges understanding prior to discharge.  Activity Barriers & Risk Stratification:  Activity Barriers & Cardiac Risk Stratification - 03/17/23 1546       Activity Barriers & Cardiac Risk Stratification   Activity Barriers History of Falls;Balance Concerns;Arthritis;Muscular Weakness;Deconditioning;Shortness of Breath;Decreased Ventricular Function    Cardiac Risk Stratification High             6 Minute Walk:  6 Minute Walk     Row Name 03/17/23 1545         6 Minute Walk   Phase Initial     Distance 733 feet     Walk Time 5.03 minutes     # of Rest Breaks 2  21 sec, 36 sec     MPH 1.66     METS 1.41     RPE 14     Perceived Dyspnea  3     VO2 Peak 4.93     Symptoms Yes (comment)     Comments SOB, walking with limp from leg pain "poor circulation", fatigue across shoulders     Resting HR 51 bpm     Resting BP 100/50     Resting Oxygen Saturation  97 %     Exercise Oxygen Saturation  during 6 min walk 98 %     Max Ex. HR 87 bpm     Max Ex. BP 126/74     2 Minute Post BP 114/56              Oxygen Initial Assessment:   Oxygen  Re-Evaluation:   Oxygen Discharge (Final Oxygen Re-Evaluation):   Initial  Exercise Prescription:  Initial Exercise Prescription - 03/17/23 1500       Date of Initial Exercise RX and Referring Provider   Date 03/17/23    Referring Provider Zoila Shutter MD      Oxygen   Maintain Oxygen Saturation 88% or higher      NuStep   Level 1    SPM 80    Minutes 15    METs 2      REL-XR   Level 1    Speed 50    Minutes 15    METs 2      Prescription Details   Frequency (times per week) 2    Duration Progress to 30 minutes of continuous aerobic without signs/symptoms of physical distress      Intensity   THRR 40-80% of Max Heartrate 87-124    Ratings of Perceived Exertion 11-13    Perceived Dyspnea 0-4      Progression   Progression Continue to progress workloads to maintain intensity without signs/symptoms of physical distress.      Resistance Training   Training Prescription Yes    Weight 4 lb    Reps 10-15             Perform Capillary Blood Glucose checks as needed.  Exercise Prescription Changes:   Exercise Prescription Changes     Row Name 03/17/23 1500             Response to Exercise   Blood Pressure (Admit) 100/50       Blood Pressure (Exercise) 126/74       Blood Pressure (Exit) 114/56       Heart Rate (Admit) 51 bpm       Heart Rate (Exercise) 87 bpm       Heart Rate (Exit) 57 bpm       Oxygen Saturation (Admit) 97 %       Oxygen Saturation (Exercise) 98 %       Rating of Perceived Exertion (Exercise) 14       Perceived Dyspnea (Exercise) 3       Symptoms SOB, walking with limp from "poor circulation", fatigue across shoulders       Comments walk test results                Exercise Comments:   Exercise Goals and Review:   Exercise Goals     Row Name 03/17/23 1550             Exercise Goals   Increase Physical Activity Yes       Intervention Provide advice, education, support and counseling about physical  activity/exercise needs.;Develop an individualized exercise prescription for aerobic and resistive training based on initial evaluation findings, risk stratification, comorbidities and participant's personal goals.       Expected Outcomes Short Term: Attend rehab on a regular basis to increase amount of physical activity.;Long Term: Add in home exercise to make exercise part of routine and to increase amount of physical activity.;Long Term: Exercising regularly at least 3-5 days a week.       Increase Strength and Stamina Yes       Intervention Provide advice, education, support and counseling about physical activity/exercise needs.;Develop an individualized exercise prescription for aerobic and resistive training based on initial evaluation findings, risk stratification, comorbidities and participant's personal goals.       Expected Outcomes Short Term: Increase workloads from initial exercise prescription for resistance, speed, and METs.;Short Term: Perform resistance training exercises  routinely during rehab and add in resistance training at home;Long Term: Improve cardiorespiratory fitness, muscular endurance and strength as measured by increased METs and functional capacity ( )       Able to understand and use rate of perceived exertion (RPE) scale Yes       Intervention Provide education and explanation on how to use RPE scale       Expected Outcomes Short Term: Able to use RPE daily in rehab to express subjective intensity level;Long Term:  Able to use RPE to guide intensity level when exercising independently       Able to understand and use Dyspnea scale Yes       Intervention Provide education and explanation on how to use Dyspnea scale       Expected Outcomes Short Term: Able to use Dyspnea scale daily in rehab to express subjective sense of shortness of breath during exertion;Long Term: Able to use Dyspnea scale to guide intensity level when exercising independently       Knowledge and  understanding of Target Heart Rate Range (THRR) Yes       Intervention Provide education and explanation of THRR including how the numbers were predicted and where they are located for reference       Expected Outcomes Short Term: Able to state/look up THRR;Long Term: Able to use THRR to govern intensity when exercising independently;Short Term: Able to use daily as guideline for intensity in rehab       Able to check pulse independently Yes       Intervention Provide education and demonstration on how to check pulse in carotid and radial arteries.;Review the importance of being able to check your own pulse for safety during independent exercise       Expected Outcomes Short Term: Able to explain why pulse checking is important during independent exercise;Long Term: Able to check pulse independently and accurately       Understanding of Exercise Prescription Yes       Intervention Provide education, explanation, and written materials on patient's individual exercise prescription       Expected Outcomes Short Term: Able to explain program exercise prescription;Long Term: Able to explain home exercise prescription to exercise independently                Exercise Goals Re-Evaluation :    Discharge Exercise Prescription (Final Exercise Prescription Changes):  Exercise Prescription Changes - 03/17/23 1500       Response to Exercise   Blood Pressure (Admit) 100/50    Blood Pressure (Exercise) 126/74    Blood Pressure (Exit) 114/56    Heart Rate (Admit) 51 bpm    Heart Rate (Exercise) 87 bpm    Heart Rate (Exit) 57 bpm    Oxygen Saturation (Admit) 97 %    Oxygen Saturation (Exercise) 98 %    Rating of Perceived Exertion (Exercise) 14    Perceived Dyspnea (Exercise) 3    Symptoms SOB, walking with limp from "poor circulation", fatigue across shoulders    Comments walk test results             Nutrition:  Target Goals: Understanding of nutrition guidelines, daily intake of sodium  1500mg , cholesterol 200mg , calories 30% from fat and 7% or less from saturated fats, daily to have 5 or more servings of fruits and vegetables.  Biometrics:  Pre Biometrics - 03/17/23 1551       Pre Biometrics   Height 6' 0.5" (1.842 m)    Weight  93.1 kg    Waist Circumference 40 inches    Hip Circumference 41.5 inches    Waist to Hip Ratio 0.96 %    BMI (Calculated) 27.43    Grip Strength 17.9 kg    Single Leg Stand 1.8 seconds              Nutrition Therapy Plan and Nutrition Goals:   Nutrition Assessments:  MEDIFICTS Score Key: >=70 Need to make dietary changes  40-70 Heart Healthy Diet <= 40 Therapeutic Level Cholesterol Diet  Flowsheet Row CARDIAC VIRTUAL BASED CARE from 03/12/2023 in [facility] CARDIAC REHABILITATION  Picture Your Plate Total Score on Admission 31      Picture Your Plate Scores: <42 Unhealthy dietary pattern with much room for improvement. 41-50 Dietary pattern unlikely to meet recommendations for good health and room for improvement. 51-60 More healthful dietary pattern, with some room for improvement.  >60 Healthy dietary pattern, although there may be some specific behaviors that could be improved.    Nutrition Goals Re-Evaluation:   Nutrition Goals Discharge (Final Nutrition Goals Re-Evaluation):   Psychosocial: Target Goals: Acknowledge presence or absence of significant depression and/or stress, maximize coping skills, provide positive support system. Participant is able to verbalize types and ability to use techniques and skills needed for reducing stress and depression.  Initial Review & Psychosocial Screening:  Initial Psych Review & Screening - 03/12/23 1034       Initial Review   Current issues with None Identified      Family Dynamics   Good Support System? Yes      Barriers   Psychosocial barriers to participate in program There are no identifiable barriers or psychosocial needs.;The patient should benefit from  training in stress management and relaxation.      Screening Interventions   Interventions Encouraged to exercise;To provide support and resources with identified psychosocial needs;Provide feedback about the scores to participant    Expected Outcomes Short Term goal: Utilizing psychosocial counselor, staff and physician to assist with identification of specific Stressors or current issues interfering with healing process. Setting desired goal for each stressor or current issue identified.;Long Term Goal: Stressors or current issues are controlled or eliminated.;Short Term goal: Identification and review with participant of any Quality of Life or Depression concerns found by scoring the questionnaire.;Long Term goal: The participant improves quality of Life and PHQ9 Scores as seen by post scores and/or verbalization of changes             Quality of Life Scores:  Quality of Life - 03/12/23 0904       Quality of Life   Select Quality of Life      Quality of Life Scores   Health/Function Pre 19.83 %    Socioeconomic Pre 24.38 %    Psych/Spiritual Pre 18.86 %    Family Pre 27.6 %    GLOBAL Pre 21.79 %            Scores of 19 and below usually indicate a poorer quality of life in these areas.  A difference of  2-3 points is a clinically meaningful difference.  A difference of 2-3 points in the total score of the Quality of Life Index has been associated with significant improvement in overall quality of life, self-image, physical symptoms, and general health in studies assessing change in quality of life.  PHQ-9: Review Flowsheet       03/17/2023 05/01/2021 02/13/2021 12/11/2020  Depression screen PHQ 2/9  Decreased Interest 0  0 0 0  Down, Depressed, Hopeless 0 0 0 0  PHQ - 2 Score 0 0 0 0  Altered sleeping 1 - - -  Tired, decreased energy 1 - - -  Change in appetite 1 - - -  Feeling bad or failure about yourself  0 - - -  Trouble concentrating 0 - - -  Moving slowly or  fidgety/restless 0 - - -  Suicidal thoughts 0 - - -  PHQ-9 Score 3 - - -  Difficult doing work/chores Somewhat difficult - - -    Details           Interpretation of Total Score  Total Score Depression Severity:  1-4 = Minimal depression, 5-9 = Mild depression, 10-14 = Moderate depression, 15-19 = Moderately severe depression, 20-27 = Severe depression   Psychosocial Evaluation and Intervention:  Psychosocial Evaluation - 03/12/23 1038       Psychosocial Evaluation & Interventions   Interventions Stress management education;Relaxation education;Encouraged to exercise with the program and follow exercise prescription    Comments Patient referred to CR with chronic systolic HF. He had CABG in 2013 and an ICD placed in 2023. He had an ablation to treat A-Fib/A-Flutter 7/23. He denies any depression, anxiety or stressors in his life. He says he sleeps well with a BIPAP. He has been retired for several years. He worked as a Buyer, retail. He lives alone but says he has great support from his neighbors and friends. He has a daughter that lives out of town but does check on him a lot. He is currently doing outpatient PT working on his balance and gait and strengthening. He plans to continue doing PT while participating in CR. He had 2 falls in May falling backwards hitting his head causing subdural hematoma and was treated in the ED both times. He says he has a cane and walker but does not use them. He enjoys playing golf but is not playing as much as he would like to. His goals for the program are to get stronger; improve his balance; and be able to do more activities without getting tired. He has no barriers identified to complete CR.    Expected Outcomes Short Term: Start the program and attend consistently. Long Term: meet his personal goals.    Continue Psychosocial Services  Follow up required by staff             Psychosocial Re-Evaluation:   Psychosocial Discharge (Final  Psychosocial Re-Evaluation):   Vocational Rehabilitation: Provide vocational rehab assistance to qualifying candidates.   Vocational Rehab Evaluation & Intervention:  Vocational Rehab - 03/12/23 1031       Initial Vocational Rehab Evaluation & Intervention   Assessment shows need for Vocational Rehabilitation No      Vocational Rehab Re-Evaulation   Comments Patient is retired.             Education: Education Goals: Education classes will be provided on a weekly basis, covering required topics. Participant will state understanding/return demonstration of topics presented.  Learning Barriers/Preferences:  Learning Barriers/Preferences - 03/12/23 1031       Learning Barriers/Preferences   Learning Barriers None    Learning Preferences Audio;Written Material;Pictoral;Skilled Demonstration             Education Topics: Hypertension, Hypertension Reduction -Define heart disease and high blood pressure. Discus how high blood pressure affects the body and ways to reduce high blood pressure.   Exercise and Your Heart -Discuss why it  is important to exercise, the FITT principles of exercise, normal and abnormal responses to exercise, and how to exercise safely.   Angina -Discuss definition of angina, causes of angina, treatment of angina, and how to decrease risk of having angina.   Cardiac Medications -Review what the following cardiac medications are used for, how they affect the body, and side effects that may occur when taking the medications.  Medications include Aspirin, Beta blockers, calcium channel blockers, ACE Inhibitors, angiotensin receptor blockers, diuretics, digoxin, and antihyperlipidemics.   Congestive Heart Failure -Discuss the definition of CHF, how to live with CHF, the signs and symptoms of CHF, and how keep track of weight and sodium intake.   Heart Disease and Intimacy -Discus the effect sexual activity has on the heart, how changes occur  during intimacy as we age, and safety during sexual activity.   Smoking Cessation / COPD -Discuss different methods to quit smoking, the health benefits of quitting smoking, and the definition of COPD.   Nutrition I: Fats -Discuss the types of cholesterol, what cholesterol does to the heart, and how cholesterol levels can be controlled.   Nutrition II: Labels -Discuss the different components of food labels and how to read food label   Heart Parts/Heart Disease and PAD -Discuss the anatomy of the heart, the pathway of blood circulation through the heart, and these are affected by heart disease.   Stress I: Signs and Symptoms -Discuss the causes of stress, how stress may lead to anxiety and depression, and ways to limit stress.   Stress II: Relaxation -Discuss different types of relaxation techniques to limit stress.   Warning Signs of Stroke / TIA -Discuss definition of a stroke, what the signs and symptoms are of a stroke, and how to identify when someone is having stroke.   Knowledge Questionnaire Score:  Knowledge Questionnaire Score - 03/12/23 0906       Knowledge Questionnaire Score   Pre Score 21/26             Core Components/Risk Factors/Patient Goals at Admission:  Personal Goals and Risk Factors at Admission - 03/17/23 1551       Core Components/Risk Factors/Patient Goals on Admission    Weight Management Yes;Weight Maintenance;Weight Loss    Intervention Weight Management: Develop a combined nutrition and exercise program designed to reach desired caloric intake, while maintaining appropriate intake of nutrient and fiber, sodium and fats, and appropriate energy expenditure required for the weight goal.;Weight Management: Provide education and appropriate resources to help participant work on and attain dietary goals.    Admit Weight 205 lb 3.2 oz (93.1 kg)    Goal Weight: Short Term 200 lb (90.7 kg)    Goal Weight: Long Term 198 lb (89.8 kg)     Expected Outcomes Short Term: Continue to assess and modify interventions until short term weight is achieved;Long Term: Adherence to nutrition and physical activity/exercise program aimed toward attainment of established weight goal;Weight Maintenance: Understanding of the daily nutrition guidelines, which includes 25-35% calories from fat, 7% or less cal from saturated fats, less than 200mg  cholesterol, less than 1.5gm of sodium, & 5 or more servings of fruits and vegetables daily;Weight Loss: Understanding of general recommendations for a balanced deficit meal plan, which promotes 1-2 lb weight loss per week and includes a negative energy balance of [phone] kcal/d;Understanding recommendations for meals to include 15-35% energy as protein, 25-35% energy from fat, 35-60% energy from carbohydrates, less than 200mg  of dietary cholesterol, 20-35 gm of total fiber  daily;Understanding of distribution of calorie intake throughout the day with the consumption of 4-5 meals/snacks    Diabetes Yes    Intervention Provide education about signs/symptoms and action to take for hypo/hyperglycemia.;Provide education about proper nutrition, including hydration, and aerobic/resistive exercise prescription along with prescribed medications to achieve blood glucose in normal ranges: Fasting glucose 65-99 mg/dL    Expected Outcomes Short Term: Participant verbalizes understanding of the signs/symptoms and immediate care of hyper/hypoglycemia, proper foot care and importance of medication, aerobic/resistive exercise and nutrition plan for blood glucose control.;Long Term: Attainment of HbA1C < 7%.    Heart Failure Yes    Intervention Provide a combined exercise and nutrition program that is supplemented with education, support and counseling about heart failure. Directed toward relieving symptoms such as shortness of breath, decreased exercise tolerance, and extremity edema.    Expected Outcomes Improve functional capacity of  life;Short term: Attendance in program 2-3 days a week with increased exercise capacity. Reported lower sodium intake. Reported increased fruit and vegetable intake. Reports medication compliance.;Short term: Daily weights obtained and reported for increase. Utilizing diuretic protocols set by physician.;Long term: Adoption of self-care skills and reduction of barriers for early signs and symptoms recognition and intervention leading to self-care maintenance.    Hypertension Yes    Intervention Provide education on lifestyle modifcations including regular physical activity/exercise, weight management, moderate sodium restriction and increased consumption of fresh fruit, vegetables, and low fat dairy, alcohol moderation, and smoking cessation.;Monitor prescription use compliance.    Expected Outcomes Short Term: Continued assessment and intervention until BP is < 140/82mm HG in hypertensive participants. < 130/52mm HG in hypertensive participants with diabetes, heart failure or chronic kidney disease.;Long Term: Maintenance of blood pressure at goal levels.    Lipids Yes    Intervention Provide education and support for participant on nutrition & aerobic/resistive exercise along with prescribed medications to achieve LDL 70mg , HDL >40mg .    Expected Outcomes Short Term: Participant states understanding of desired cholesterol values and is compliant with medications prescribed. Participant is following exercise prescription and nutrition guidelines.;Long Term: Cholesterol controlled with medications as prescribed, with individualized exercise RX and with personalized nutrition plan. Value goals: LDL < 70mg , HDL > 40 mg.             Core Components/Risk Factors/Patient Goals Review:    Core Components/Risk Factors/Patient Goals at Discharge (Final Review):    ITP Comments:   Comments: Patient arrived for 1st visit/orientation/education at 1400. Patient was referred to CR by Dr. Zoila Shutter due  to Chronic systolic HF. During orientation advised patient on arrival and appointment times what to wear, what to do before, during and after exercise. Reviewed attendance and class policy.  Pt is scheduled to return Cardiac Rehab on Tuesday 03/23/23 at 1330. Pt was advised to come to class 15 minutes before class starts.  Discussed RPE/Dpysnea scales. Patient participated in warm up stretches. Patient was able to complete 6 minute walk test.  Telemetry:Ventricular Paced Rhythm. Patient was measured for the equipment. Discussed equipment safety with patient. Took patient pre-anthropometric measurements. Patient finished visit at 1510.

## 2023-03-18 ENCOUNTER — Ambulatory Visit: Payer: Medicare Other | Admitting: Neurology

## 2023-03-18 DIAGNOSIS — Z08 Encounter for follow-up examination after completed treatment for malignant neoplasm: Secondary | ICD-10-CM | POA: Diagnosis not present

## 2023-03-18 DIAGNOSIS — Z85828 Personal history of other malignant neoplasm of skin: Secondary | ICD-10-CM | POA: Diagnosis not present

## 2023-03-19 DIAGNOSIS — R2681 Unsteadiness on feet: Secondary | ICD-10-CM | POA: Diagnosis not present

## 2023-03-19 DIAGNOSIS — M6281 Muscle weakness (generalized): Secondary | ICD-10-CM | POA: Diagnosis not present

## 2023-03-19 DIAGNOSIS — R262 Difficulty in walking, not elsewhere classified: Secondary | ICD-10-CM | POA: Diagnosis not present

## 2023-03-19 DIAGNOSIS — S065X0S Traumatic subdural hemorrhage without loss of consciousness, sequela: Secondary | ICD-10-CM | POA: Diagnosis not present

## 2023-03-23 ENCOUNTER — Encounter (HOSPITAL_COMMUNITY)
Admission: RE | Admit: 2023-03-23 | Discharge: 2023-03-23 | Disposition: A | Discharge status: Home / Self Care | Payer: Medicare Other | Source: Ambulatory Visit | Attending: Internal Medicine

## 2023-03-23 DIAGNOSIS — I5022 Chronic systolic (congestive) heart failure: Secondary | ICD-10-CM

## 2023-03-23 DIAGNOSIS — Z95810 Presence of automatic (implantable) cardiac defibrillator: Secondary | ICD-10-CM | POA: Diagnosis not present

## 2023-03-23 NOTE — Progress Notes (Signed)
Daily Session Note  Patient Details  Name: [name] [name] MRN: [id number] Date of Birth: 1946-02-08 Referring Provider:   Flowsheet Row CARDIAC REHAB PHASE II ORIENTATION from 03/17/2023 in [facility] CARDIAC REHABILITATION  Referring Provider Zoila Shutter MD       Encounter Date: 03/23/2023  Check In:  Session Check In - 03/23/23 1030       Check-In   Supervising physician immediately available to respond to emergencies See telemetry face sheet for immediately available MD    Location [facility]    Staff Present Ross Ludwig, BS, Exercise Physiologist;Jessica Parkdale, MA, RCEP, CCRP, Harolyn Rutherford, RN, BSN    Virtual Visit No    Medication changes reported     No    Fall or balance concerns reported    Yes    Comments Patient has history of falls and reports poor balance.    Warm-up and Cool-down Performed on first and last piece of equipment    Resistance Training Performed Yes    VAD Patient? No      Pain Assessment   Currently in Pain? No/denies             Capillary Blood Glucose: No results found for this or any previous visit (from the past 24 hour(s)).    Social History   Tobacco Use  Smoking Status Former   Types: Cigars   Quit date: 09/07/2011   Years since quitting: 11.5  Smokeless Tobacco Former   Quit date: 02/28/2012  Tobacco Comments   Former smoker 02/16/23    Goals Met:  Independence with exercise equipment Exercise tolerated well No report of concerns or symptoms today Strength training completed today  Goals Unmet:  Not Applicable  Comments: First full day of exercise!  Patient was oriented to gym and equipment including functions, settings, policies, and procedures.  Patient's individual exercise prescription and treatment plan were reviewed.  All starting workloads were established based on the results of the 6 minute walk test done at initial orientation visit.  The plan for exercise progression was also  introduced and progression will be customized based on patient's performance and goals.    Dr. Dina Rich is Medical Director for [facility] Cardiac Rehab

## 2023-03-24 DIAGNOSIS — H9042 Sensorineural hearing loss, unilateral, left ear, with unrestricted hearing on the contralateral side: Secondary | ICD-10-CM | POA: Diagnosis not present

## 2023-03-24 DIAGNOSIS — R42 Dizziness and giddiness: Secondary | ICD-10-CM | POA: Diagnosis not present

## 2023-03-24 DIAGNOSIS — H838X2 Other specified diseases of left inner ear: Secondary | ICD-10-CM | POA: Diagnosis not present

## 2023-03-24 DIAGNOSIS — H9202 Otalgia, left ear: Secondary | ICD-10-CM | POA: Diagnosis not present

## 2023-03-25 ENCOUNTER — Encounter (HOSPITAL_COMMUNITY)
Admission: RE | Admit: 2023-03-25 | Discharge: 2023-03-25 | Disposition: A | Discharge status: Home / Self Care | Payer: Medicare Other | Source: Ambulatory Visit | Attending: Internal Medicine | Admitting: Internal Medicine

## 2023-03-25 DIAGNOSIS — I5022 Chronic systolic (congestive) heart failure: Secondary | ICD-10-CM | POA: Diagnosis not present

## 2023-03-25 DIAGNOSIS — Z95810 Presence of automatic (implantable) cardiac defibrillator: Secondary | ICD-10-CM | POA: Diagnosis not present

## 2023-03-25 LAB — GLUCOSE, CAPILLARY: Glucose-Capillary: 228 mg/dL — ABNORMAL HIGH (ref 70–99)

## 2023-03-25 NOTE — Progress Notes (Signed)
Daily Session Note  Patient Details  Name: [name] [name] MRN: [id number] Date of Birth: May 27, 1946 Referring Provider:   Flowsheet Row CARDIAC REHAB PHASE II ORIENTATION from 03/17/2023 in [facility] CARDIAC REHABILITATION  Referring Provider Zoila Shutter MD       Encounter Date: 03/25/2023  Check In:  Session Check In - 03/25/23 1015       Check-In   Supervising physician immediately available to respond to emergencies See telemetry face sheet for immediately available ER MD    Location [facility]    Staff Present Fabio Pierce, MA, RCEP, CCRP, Dow Adolph, RN, BSN;Heather Fredric Mare, BS, Exercise Physiologist    Virtual Visit No    Medication changes reported     No    Fall or balance concerns reported    Yes    Comments Patient has history of falls and reports poor balance.    Warm-up and Cool-down Performed on first and last piece of equipment    Resistance Training Performed Yes    VAD Patient? No    PAD/SET Patient? No      Pain Assessment   Currently in Pain? No/denies    Multiple Pain Sites No             Capillary Blood Glucose: Results for orders placed or performed during the hospital encounter of 03/25/23 (from the past 24 hour(s))  Glucose, capillary     Status: Abnormal   Collection Time: 03/25/23 10:24 AM  Result Value Ref Range   Glucose-Capillary 228 (H) 70 - 99 mg/dL      Social History   Tobacco Use  Smoking Status Former   Types: Cigars   Quit date: 09/07/2011   Years since quitting: 11.5  Smokeless Tobacco Former   Quit date: 02/28/2012  Tobacco Comments   Former smoker 02/16/23    Goals Met:  Proper associated with RPD/PD & O2 Sat Independence with exercise equipment Using PLB without cueing & demonstrates good technique Exercise tolerated well No report of concerns or symptoms today Strength training completed today  Goals Unmet:  Not Applicable  Comments: Pt able to follow exercise prescription  today without complaint.  Will continue to monitor for progression.    Dr. Erick Blinks is Medical Director for [facility] Pulmonary Rehab.

## 2023-03-30 ENCOUNTER — Ambulatory Visit: Payer: Medicare Other | Admitting: Neurology

## 2023-03-30 ENCOUNTER — Encounter (HOSPITAL_COMMUNITY)
Admission: RE | Admit: 2023-03-30 | Discharge: 2023-03-30 | Disposition: A | Discharge status: Home / Self Care | Payer: Medicare Other | Source: Ambulatory Visit | Attending: Internal Medicine | Admitting: Internal Medicine

## 2023-03-30 DIAGNOSIS — I5022 Chronic systolic (congestive) heart failure: Secondary | ICD-10-CM | POA: Insufficient documentation

## 2023-03-30 DIAGNOSIS — Z95810 Presence of automatic (implantable) cardiac defibrillator: Secondary | ICD-10-CM | POA: Diagnosis not present

## 2023-03-30 LAB — GLUCOSE, CAPILLARY
Glucose-Capillary: 208 mg/dL — ABNORMAL HIGH (ref 70–99)
Glucose-Capillary: 168 mg/dL — ABNORMAL HIGH (ref 70–99)

## 2023-03-30 NOTE — Progress Notes (Signed)
Daily Session Note  Patient Details  Name: [name] [name] MRN: [id number] Date of Birth: October 16, 1945 Referring Provider:   Flowsheet Row CARDIAC REHAB PHASE II ORIENTATION from 03/17/2023 in [facility] CARDIAC REHABILITATION  Referring Provider Zoila Shutter MD       Encounter Date: 03/30/2023  Check In:  Session Check In - 03/30/23 1030       Check-In   Supervising physician immediately available to respond to emergencies See telemetry face sheet for immediately available MD    Location [facility]    Staff Present Ross Ludwig, BS, Exercise Physiologist;Jessica Juanetta Gosling, MA, RCEP, CCRP, CCET    Virtual Visit No    Medication changes reported     No    Fall or balance concerns reported    Yes    Comments Patient has history of falls and reports poor balance.    Tobacco Cessation No Change    Warm-up and Cool-down Performed on first and last piece of equipment    VAD Patient? No    PAD/SET Patient? No      Pain Assessment   Currently in Pain? No/denies    Multiple Pain Sites No             Capillary Blood Glucose: Results for orders placed or performed during the hospital encounter of 03/30/23 (from the past 24 hour(s))  Glucose, capillary     Status: Abnormal   Collection Time: 03/30/23 10:33 AM  Result Value Ref Range   Glucose-Capillary 208 (H) 70 - 99 mg/dL      Social History   Tobacco Use  Smoking Status Former   Types: Cigars   Quit date: 09/07/2011   Years since quitting: 11.5  Smokeless Tobacco Former   Quit date: 02/28/2012  Tobacco Comments   Former smoker 02/16/23    Goals Met:  Independence with exercise equipment Exercise tolerated well No report of concerns or symptoms today Strength training completed today  Goals Unmet:  Not Applicable  Comments: Pt able to follow exercise prescription today without complaint.  Will continue to monitor for progression.    Dr. Dina Rich is Medical Director for [facility]  Cardiac Rehab

## 2023-04-01 ENCOUNTER — Encounter (HOSPITAL_COMMUNITY)
Admission: RE | Admit: 2023-04-01 | Discharge: 2023-04-01 | Disposition: A | Discharge status: Home / Self Care | Payer: Medicare Other | Source: Ambulatory Visit | Attending: Internal Medicine | Admitting: Internal Medicine

## 2023-04-01 DIAGNOSIS — Z95810 Presence of automatic (implantable) cardiac defibrillator: Secondary | ICD-10-CM | POA: Diagnosis not present

## 2023-04-01 DIAGNOSIS — I5022 Chronic systolic (congestive) heart failure: Secondary | ICD-10-CM

## 2023-04-01 NOTE — Progress Notes (Signed)
Daily Session Note  Patient Details  Name: [name] [name] MRN: [id number] Date of Birth: May 05, 1946 Referring Provider:   Flowsheet Row CARDIAC REHAB PHASE II ORIENTATION from 03/17/2023 in [facility] CARDIAC REHABILITATION  Referring Provider Zoila Shutter MD       Encounter Date: 04/01/2023  Check In:  Session Check In - 04/01/23 1030       Check-In   Supervising physician immediately available to respond to emergencies See telemetry face sheet for immediately available MD    Location [facility]    Staff Present Ross Ludwig, BS, Exercise Physiologist;Debra Laural Benes, RN, Pleas Koch, RN, BSN    Virtual Visit No    Medication changes reported     No    Fall or balance concerns reported    Yes    Comments Patient has history of falls and reports poor balance.    Tobacco Cessation No Change    Warm-up and Cool-down Performed on first and last piece of equipment    Resistance Training Performed Yes    VAD Patient? No      Pain Assessment   Currently in Pain? No/denies             Capillary Blood Glucose: No results found for this or any previous visit (from the past 24 hour(s)).    Social History   Tobacco Use  Smoking Status Former   Types: Cigars   Quit date: 09/07/2011   Years since quitting: 11.5  Smokeless Tobacco Former   Quit date: 02/28/2012  Tobacco Comments   Former smoker 02/16/23    Goals Met:  Independence with exercise equipment Exercise tolerated well No report of concerns or symptoms today Strength training completed today  Goals Unmet:  Not Applicable  Comments: Pt able to follow exercise prescription today without complaint.  Will continue to monitor for progression.    Dr. Dina Rich is Medical Director for [facility] Cardiac Rehab

## 2023-04-05 ENCOUNTER — Encounter (INDEPENDENT_AMBULATORY_CARE_PROVIDER_SITE_OTHER): Payer: Self-pay | Admitting: Otolaryngology

## 2023-04-05 ENCOUNTER — Ambulatory Visit (INDEPENDENT_AMBULATORY_CARE_PROVIDER_SITE_OTHER): Payer: Medicare Other | Admitting: Otolaryngology

## 2023-04-05 VITALS — Ht 74.0 in | Wt 202.0 lb

## 2023-04-05 DIAGNOSIS — H9042 Sensorineural hearing loss, unilateral, left ear, with unrestricted hearing on the contralateral side: Secondary | ICD-10-CM

## 2023-04-05 DIAGNOSIS — H9312 Tinnitus, left ear: Secondary | ICD-10-CM | POA: Diagnosis not present

## 2023-04-06 ENCOUNTER — Encounter (HOSPITAL_COMMUNITY)
Admission: RE | Admit: 2023-04-06 | Discharge: 2023-04-06 | Disposition: A | Discharge status: Home / Self Care | Payer: Medicare Other | Source: Ambulatory Visit | Attending: Internal Medicine

## 2023-04-06 DIAGNOSIS — Z95810 Presence of automatic (implantable) cardiac defibrillator: Secondary | ICD-10-CM | POA: Diagnosis not present

## 2023-04-06 DIAGNOSIS — I5022 Chronic systolic (congestive) heart failure: Secondary | ICD-10-CM | POA: Diagnosis not present

## 2023-04-06 DIAGNOSIS — H9042 Sensorineural hearing loss, unilateral, left ear, with unrestricted hearing on the contralateral side: Secondary | ICD-10-CM | POA: Insufficient documentation

## 2023-04-06 NOTE — Progress Notes (Signed)
Daily Session Note  Patient Details  Name: [name] [name] MRN: [id number] Date of Birth: 07-12-1945 Referring Provider:   Flowsheet Row CARDIAC REHAB PHASE II ORIENTATION from 03/17/2023 in [facility] CARDIAC REHABILITATION  Referring Provider Zoila Shutter MD       Encounter Date: 04/06/2023  Check In:   Capillary Blood Glucose: No results found for this or any previous visit (from the past 24 hour(s)).    Social History   Tobacco Use  Smoking Status Former   Types: Cigars   Quit date: 09/07/2011   Years since quitting: 11.5  Smokeless Tobacco Former   Quit date: 02/28/2012  Tobacco Comments   Former smoker 02/16/23    Goals Met:  Exercise tolerated well No report of concerns or symptoms today Strength training completed today  Goals Unmet:  Not Applicable  Comments: Pt able to follow exercise prescription today without complaint.  Will continue to monitor for progression.    Dr. Dina Rich is Medical Director for [facility] Cardiac Rehab

## 2023-04-06 NOTE — Progress Notes (Signed)
Patient ID: [name] [name], male   DOB: 1945-08-15, 77 y.o.   MRN: [id number]  Procedure: Left chemical labyrinthotomy with dexamethasone infusion (#1)  Indication: Left-sided sudden hearing loss and tinnitus, not tolerating oral steroid treatment.  Descriptions: The patient is placed supine on the operating table.  under the operating microscope, the left ear canal is cleaned of all cerumen.  1% lidocaine is injected into all four quadrants of the ear canal. After adequate local anesthesia is achieved, a standard myringotomy incision is made at the anterior inferior quadrant of the tympanic membrane.  A ventilating tube is placed without difficulty.  Dexamethasone is infused into the middle ear space via the ventilating tube.  The patient is placed in the left ear up position for 15 minutes.  The patient tolerated the procedure well.    Follow-up care: The patient will return in one week for repeat dexamethasone infusion.

## 2023-04-07 ENCOUNTER — Encounter (HOSPITAL_COMMUNITY): Payer: Self-pay | Admitting: *Deleted

## 2023-04-07 DIAGNOSIS — I5022 Chronic systolic (congestive) heart failure: Secondary | ICD-10-CM

## 2023-04-07 NOTE — Progress Notes (Signed)
Cardiac Individual Treatment Plan  Patient Details  Name: [name] [name] MRN: [id number] Date of Birth: 1946-02-18 Referring Provider:   Flowsheet Row CARDIAC REHAB PHASE II ORIENTATION from 03/17/2023 in [facility] CARDIAC REHABILITATION  Referring Provider Zoila Shutter MD       Initial Encounter Date:  Flowsheet Row CARDIAC REHAB PHASE II ORIENTATION from 03/17/2023 in [place] CARDIAC REHABILITATION  Date 03/17/23       Visit Diagnosis: Heart failure, chronic systolic (HCC)  Patient's Home Medications on Admission:  Current Outpatient Medications:    acetaminophen (TYLENOL) 500 MG tablet, Take 500-1,000 mg by mouth every 6 (six) hours as needed (for pain.)., Disp: , Rfl:    amiodarone (PACERONE) 200 MG tablet, Take 2 tablets (400 mg total) TWICE a day for 2 weeks, then take 1 tablet (200 mg total) TWICE a day for 2 weeks, then take 1 tablet (200 mg total) ONCE a day thereafter (Patient taking differently: Take 100 mg by mouth daily. Take 0.5 tablets (100 mg total) once daily.), Disp: 90 tablet, Rfl: 2   apixaban (ELIQUIS) 5 MG TABS tablet, Take 5 mg by mouth 2 (two) times daily., Disp: , Rfl:    carvedilol (COREG) 3.125 MG tablet, Take 1 tablet (3.125 mg total) by mouth 2 (two) times daily with a meal., Disp: 180 tablet, Rfl: 3   empagliflozin (JARDIANCE) 25 MG TABS tablet, Take 1 tablet (25 mg total) by mouth daily., Disp: 90 tablet, Rfl: 3   furosemide (LASIX) 40 MG tablet, Take 1 1/2 tablet by mouth in the morning and 1 tablet by mouth in the afternoon for 4 days then go back down to 1 tablet by mouth twice a day (Patient taking differently: Take 40 mg by mouth daily. Take 1  tablet by mouth in the morning and 1 tablet by mouth in the afternoon as needed.), Disp: 180 tablet, Rfl: 3   gabapentin (NEURONTIN) 300 MG capsule, Take 600 mg by mouth at bedtime., Disp: , Rfl:    metFORMIN (GLUCOPHAGE-XR) 500 MG 24 hr tablet, Take 1,000 mg by mouth 2 (two) times daily., Disp: , Rfl:     Na Sulfate-K Sulfate-Mg Sulf 17.5-3.13-1.6 GM/177ML SOLN, As directed, Disp: 354 mL, Rfl: 0   ONETOUCH VERIO test strip, 1 each daily., Disp: , Rfl:    rosuvastatin (CRESTOR) 10 MG tablet, Take 1 tablet (10 mg total) by mouth every other day., Disp: 50 tablet, Rfl: 3  Past Medical History: Past Medical History:  Diagnosis Date   Arthritis    Knee L - probably   Atrial fibrillation (HCC)    BPH (benign prostatic hyperplasia)    CAD (coronary artery disease) 07/06/2011   CHF (congestive heart failure) (HCC)    Coronary artery disease    Diabetes mellitus    Frequency of urination    Heart failure (HCC)    High cholesterol    History of nuclear stress test 09/2010   dipyridamole; mild perfusion defect due to attenuation with mild superimposed ischemia in apical septal, apical, apical inferior & apical lateral regions; rest LV enlarged in size; prominent gut uptake in infero-apical region; no significant ischemia demonstrated; low risk scan    HNP (herniated nucleus pulposus), lumbar    Hypertension    Ischemic cardiomyopathy    LV dysfunction 07/06/2011   Nocturia    Right bundle branch block    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 Wayland Salinas   Sleep apnea    sleep study 10/2010- AHI during total sleep 32.1/hr and during REM 62.3/hr (severe sleep apnea)unable to tolerate c pap   Spinal stenosis of lumbar region    Stroke (HCC)    [place]     Tobacco Use: Social History   Tobacco Use  Smoking Status Former   Types: Cigars   Quit date: 09/07/2011   Years since quitting: 11.5  Smokeless Tobacco Former   Quit date: 02/28/2012  Tobacco Comments   Former smoker 02/16/23    Labs: Review Flowsheet  More data exists      Latest Ref Rng & Units 10/17/2020 11/14/2020 01/02/2021 08/14/2021 12/10/2021  Labs for ITP Cardiac and Pulmonary Rehab  Cholestrol 100 - 199 mg/dL 540  - - 981  98   LDL (calc)  0 - 99 mg/dL 191  - - 478  46   HDL-C >39 mg/dL 36  - - 38  35   Trlycerides 0 - 149 mg/dL 97  - - 86  88   Hemoglobin A1c 4.8 - 5.6 % 7.0  - - - -  TCO2 22 - 32 mmol/L - 26  25  - -    Details            Capillary Blood Glucose: Lab Results  Component Value Date   GLUCAP 168 (H) 03/30/2023   GLUCAP 208 (H) 03/30/2023   GLUCAP 228 (H) 03/25/2023   GLUCAP 161 (H) 01/19/2023   GLUCAP 136 (H) 01/19/2023     Exercise Target Goals: Exercise Program Goal: Individual exercise prescription set using results from initial 6 min walk test and THRR while considering  patient's activity barriers and safety.   Exercise Prescription Goal: Starting with aerobic activity 30 plus minutes a day, 3 days per week for initial exercise prescription. Provide home exercise prescription and guidelines that participant acknowledges understanding prior to discharge.  Activity Barriers & Risk Stratification:  Activity Barriers & Cardiac Risk Stratification - 03/17/23 1546       Activity Barriers & Cardiac Risk Stratification   Activity Barriers History of Falls;Balance Concerns;Arthritis;Muscular Weakness;Deconditioning;Shortness of Breath;Decreased Ventricular Function    Cardiac Risk Stratification High             6 Minute Walk:  6 Minute Walk     Row Name 03/17/23 1545         6 Minute Walk   Phase Initial     Distance 733 feet     Walk Time 5.03 minutes     # of Rest Breaks 2  21 sec, 36 sec     MPH 1.66     METS 1.41     RPE 14     Perceived Dyspnea  3     VO2 Peak 4.93     Symptoms Yes (comment)     Comments SOB, walking with limp from leg pain "poor circulation", fatigue across shoulders     Resting HR 51 bpm     Resting BP 100/50     Resting Oxygen Saturation  97 %     Exercise Oxygen Saturation  during 6 min walk 98 %     Max Ex. HR 87 bpm     Max Ex. BP 126/74     2 Minute Post BP 114/56              Oxygen Initial Assessment:   Oxygen  Re-Evaluation:   Oxygen Discharge (Final Oxygen Re-Evaluation):  Initial Exercise Prescription:  Initial Exercise Prescription - 03/17/23 1500       Date of Initial Exercise RX and Referring Provider   Date 03/17/23    Referring Provider Zoila Shutter MD      Oxygen   Maintain Oxygen Saturation 88% or higher      NuStep   Level 1    SPM 80    Minutes 15    METs 2      REL-XR   Level 1    Speed 50    Minutes 15    METs 2      Prescription Details   Frequency (times per week) 2    Duration Progress to 30 minutes of continuous aerobic without signs/symptoms of physical distress      Intensity   THRR 40-80% of Max Heartrate 87-124    Ratings of Perceived Exertion 11-13    Perceived Dyspnea 0-4      Progression   Progression Continue to progress workloads to maintain intensity without signs/symptoms of physical distress.      Resistance Training   Training Prescription Yes    Weight 4 lb    Reps 10-15             Perform Capillary Blood Glucose checks as needed.  Exercise Prescription Changes:   Exercise Prescription Changes     Row Name 03/17/23 1500 03/30/23 1200           Response to Exercise   Blood Pressure (Admit) 100/50 100/50      Blood Pressure (Exercise) 126/74 98/56      Blood Pressure (Exit) 114/56 100/56      Heart Rate (Admit) 51 bpm 59 bpm      Heart Rate (Exercise) 87 bpm 109 bpm      Heart Rate (Exit) 57 bpm 58 bpm      Oxygen Saturation (Admit) 97 % --      Oxygen Saturation (Exercise) 98 % --      Rating of Perceived Exertion (Exercise) 14 12      Perceived Dyspnea (Exercise) 3 --      Symptoms SOB, walking with limp from "poor circulation", fatigue across shoulders --      Comments walk test results --      Duration -- Continue with 30 min of aerobic exercise without signs/symptoms of physical distress.      Intensity -- THRR unchanged        Progression   Progression -- Continue to progress workloads to maintain  intensity without signs/symptoms of physical distress.        Resistance Training   Training Prescription -- Yes      Weight -- 4 lbs      Reps -- 10-15        NuStep   Level -- 2      SPM -- 92      Minutes -- 15      METs -- 2.2        REL-XR   Level -- 2      Speed -- 48      Minutes -- 15      METs -- 2.2        Oxygen   Maintain Oxygen Saturation -- 88% or higher               Exercise Comments:   Exercise Goals and Review:   Exercise Goals     Row Name 03/17/23 1550  Exercise Goals   Increase Physical Activity Yes       Intervention Provide advice, education, support and counseling about physical activity/exercise needs.;Develop an individualized exercise prescription for aerobic and resistive training based on initial evaluation findings, risk stratification, comorbidities and participant's personal goals.       Expected Outcomes Short Term: Attend rehab on a regular basis to increase amount of physical activity.;Long Term: Add in home exercise to make exercise part of routine and to increase amount of physical activity.;Long Term: Exercising regularly at least 3-5 days a week.       Increase Strength and Stamina Yes       Intervention Provide advice, education, support and counseling about physical activity/exercise needs.;Develop an individualized exercise prescription for aerobic and resistive training based on initial evaluation findings, risk stratification, comorbidities and participant's personal goals.       Expected Outcomes Short Term: Increase workloads from initial exercise prescription for resistance, speed, and METs.;Short Term: Perform resistance training exercises routinely during rehab and add in resistance training at home;Long Term: Improve cardiorespiratory fitness, muscular endurance and strength as measured by increased METs and functional capacity ( )       Able to understand and use rate of perceived exertion (RPE) scale Yes        Intervention Provide education and explanation on how to use RPE scale       Expected Outcomes Short Term: Able to use RPE daily in rehab to express subjective intensity level;Long Term:  Able to use RPE to guide intensity level when exercising independently       Able to understand and use Dyspnea scale Yes       Intervention Provide education and explanation on how to use Dyspnea scale       Expected Outcomes Short Term: Able to use Dyspnea scale daily in rehab to express subjective sense of shortness of breath during exertion;Long Term: Able to use Dyspnea scale to guide intensity level when exercising independently       Knowledge and understanding of Target Heart Rate Range (THRR) Yes       Intervention Provide education and explanation of THRR including how the numbers were predicted and where they are located for reference       Expected Outcomes Short Term: Able to state/look up THRR;Long Term: Able to use THRR to govern intensity when exercising independently;Short Term: Able to use daily as guideline for intensity in rehab       Able to check pulse independently Yes       Intervention Provide education and demonstration on how to check pulse in carotid and radial arteries.;Review the importance of being able to check your own pulse for safety during independent exercise       Expected Outcomes Short Term: Able to explain why pulse checking is important during independent exercise;Long Term: Able to check pulse independently and accurately       Understanding of Exercise Prescription Yes       Intervention Provide education, explanation, and written materials on patient's individual exercise prescription       Expected Outcomes Short Term: Able to explain program exercise prescription;Long Term: Able to explain home exercise prescription to exercise independently                Exercise Goals Re-Evaluation :  Exercise Goals Re-Evaluation     Row Name 03/30/23 1259              Exercise Goal Re-Evaluation  Exercise Goals Review Increase Physical Activity;Increase Strength and Stamina;Understanding of Exercise Prescription       Comments [name] [name] has been tolerating exericse well. He is on his 4th session and has already increased his levels on bith the NuStep and XR to level 2. Will continue to monitor and progress as able.       Expected Outcomes Short term: continue to increase SPM and RPM on worklaods   long term: continue to attend rehab                 Discharge Exercise Prescription (Final Exercise Prescription Changes):  Exercise Prescription Changes - 03/30/23 1200       Response to Exercise   Blood Pressure (Admit) 100/50    Blood Pressure (Exercise) 98/56    Blood Pressure (Exit) 100/56    Heart Rate (Admit) 59 bpm    Heart Rate (Exercise) 109 bpm    Heart Rate (Exit) 58 bpm    Rating of Perceived Exertion (Exercise) 12    Duration Continue with 30 min of aerobic exercise without signs/symptoms of physical distress.    Intensity THRR unchanged      Progression   Progression Continue to progress workloads to maintain intensity without signs/symptoms of physical distress.      Resistance Training   Training Prescription Yes    Weight 4 lbs    Reps 10-15      NuStep   Level 2    SPM 92    Minutes 15    METs 2.2      REL-XR   Level 2    Speed 48    Minutes 15    METs 2.2      Oxygen   Maintain Oxygen Saturation 88% or higher             Nutrition:  Target Goals: Understanding of nutrition guidelines, daily intake of sodium 1500mg , cholesterol 200mg , calories 30% from fat and 7% or less from saturated fats, daily to have 5 or more servings of fruits and vegetables.  Biometrics:  Pre Biometrics - 03/17/23 1551       Pre Biometrics   Height 6' 0.5" (1.842 m)    Weight 205 lb 3.2 oz (93.1 kg)    Waist Circumference 40 inches    Hip Circumference 41.5 inches    Waist to Hip Ratio 0.96 %    BMI (Calculated) 27.43     Grip Strength 17.9 kg    Single Leg Stand 1.8 seconds              Nutrition Therapy Plan and Nutrition Goals:   Nutrition Assessments:  MEDIFICTS Score Key: >=70 Need to make dietary changes  40-70 Heart Healthy Diet <= 40 Therapeutic Level Cholesterol Diet  Flowsheet Row CARDIAC VIRTUAL BASED CARE from 03/12/2023 in [facility] CARDIAC REHABILITATION  Picture Your Plate Total Score on Admission 31      Picture Your Plate Scores: <78 Unhealthy dietary pattern with much room for improvement. 41-50 Dietary pattern unlikely to meet recommendations for good health and room for improvement. 51-60 More healthful dietary pattern, with some room for improvement.  >60 Healthy dietary pattern, although there may be some specific behaviors that could be improved.    Nutrition Goals Re-Evaluation:   Nutrition Goals Discharge (Final Nutrition Goals Re-Evaluation):   Psychosocial: Target Goals: Acknowledge presence or absence of significant depression and/or stress, maximize coping skills, provide positive support system. Participant is able to verbalize types and ability to use  techniques and skills needed for reducing stress and depression.  Initial Review & Psychosocial Screening:  Initial Psych Review & Screening - 03/12/23 1034       Initial Review   Current issues with None Identified      Family Dynamics   Good Support System? Yes      Barriers   Psychosocial barriers to participate in program There are no identifiable barriers or psychosocial needs.;The patient should benefit from training in stress management and relaxation.      Screening Interventions   Interventions Encouraged to exercise;To provide support and resources with identified psychosocial needs;Provide feedback about the scores to participant    Expected Outcomes Short Term goal: Utilizing psychosocial counselor, staff and physician to assist with identification of specific Stressors or current  issues interfering with healing process. Setting desired goal for each stressor or current issue identified.;Long Term Goal: Stressors or current issues are controlled or eliminated.;Short Term goal: Identification and review with participant of any Quality of Life or Depression concerns found by scoring the questionnaire.;Long Term goal: The participant improves quality of Life and PHQ9 Scores as seen by post scores and/or verbalization of changes             Quality of Life Scores:  Quality of Life - 03/12/23 0904       Quality of Life   Select Quality of Life      Quality of Life Scores   Health/Function Pre 19.83 %    Socioeconomic Pre 24.38 %    Psych/Spiritual Pre 18.86 %    Family Pre 27.6 %    GLOBAL Pre 21.79 %            Scores of 19 and below usually indicate a poorer quality of life in these areas.  A difference of  2-3 points is a clinically meaningful difference.  A difference of 2-3 points in the total score of the Quality of Life Index has been associated with significant improvement in overall quality of life, self-image, physical symptoms, and general health in studies assessing change in quality of life.  PHQ-9: Review Flowsheet       03/17/2023 05/01/2021 02/13/2021 12/11/2020  Depression screen PHQ 2/9  Decreased Interest 0 0 0 0  Down, Depressed, Hopeless 0 0 0 0  PHQ - 2 Score 0 0 0 0  Altered sleeping 1 - - -  Tired, decreased energy 1 - - -  Change in appetite 1 - - -  Feeling bad or failure about yourself  0 - - -  Trouble concentrating 0 - - -  Moving slowly or fidgety/restless 0 - - -  Suicidal thoughts 0 - - -  PHQ-9 Score 3 - - -  Difficult doing work/chores Somewhat difficult - - -    Details           Interpretation of Total Score  Total Score Depression Severity:  1-4 = Minimal depression, 5-9 = Mild depression, 10-14 = Moderate depression, 15-19 = Moderately severe depression, 20-27 = Severe depression   Psychosocial Evaluation  and Intervention:  Psychosocial Evaluation - 03/12/23 1038       Psychosocial Evaluation & Interventions   Interventions Stress management education;Relaxation education;Encouraged to exercise with the program and follow exercise prescription    Comments Patient referred to CR with chronic systolic HF. He had CABG in 2013 and an ICD placed in 2023. He had an ablation to treat A-Fib/A-Flutter 7/23. He denies any depression, anxiety or stressors in  his life. He says he sleeps well with a BIPAP. He has been retired for several years. He worked as a Buyer, retail. He lives alone but says he has great support from his neighbors and friends. He has a daughter that lives out of town but does check on him a lot. He is currently doing outpatient PT working on his balance and gait and strengthening. He plans to continue doing PT while participating in CR. He had 2 falls in May falling backwards hitting his head causing subdural hematoma and was treated in the ED both times. He says he has a cane and walker but does not use them. He enjoys playing golf but is not playing as much as he would like to. His goals for the program are to get stronger; improve his balance; and be able to do more activities without getting tired. He has no barriers identified to complete CR.    Expected Outcomes Short Term: Start the program and attend consistently. Long Term: meet his personal goals.    Continue Psychosocial Services  Follow up required by staff             Psychosocial Re-Evaluation:   Psychosocial Discharge (Final Psychosocial Re-Evaluation):   Vocational Rehabilitation: Provide vocational rehab assistance to qualifying candidates.   Vocational Rehab Evaluation & Intervention:  Vocational Rehab - 03/12/23 1031       Initial Vocational Rehab Evaluation & Intervention   Assessment shows need for Vocational Rehabilitation No      Vocational Rehab Re-Evaulation   Comments Patient is retired.              Education: Education Goals: Education classes will be provided on a weekly basis, covering required topics. Participant will state understanding/return demonstration of topics presented.  Learning Barriers/Preferences:  Learning Barriers/Preferences - 03/12/23 1031       Learning Barriers/Preferences   Learning Barriers None    Learning Preferences Audio;Written Material;Pictoral;Skilled Demonstration             Education Topics: Hypertension, Hypertension Reduction -Define heart disease and high blood pressure. Discus how high blood pressure affects the body and ways to reduce high blood pressure.   Exercise and Your Heart -Discuss why it is important to exercise, the FITT principles of exercise, normal and abnormal responses to exercise, and how to exercise safely. Flowsheet Row CARDIAC REHAB PHASE II EXERCISE from 04/01/2023 in [place] CARDIAC REHABILITATION  Date 03/25/23  Educator HB  Instruction Review Code 1- Verbalizes Understanding       Angina -Discuss definition of angina, causes of angina, treatment of angina, and how to decrease risk of having angina.   Cardiac Medications -Review what the following cardiac medications are used for, how they affect the body, and side effects that may occur when taking the medications.  Medications include Aspirin, Beta blockers, calcium channel blockers, ACE Inhibitors, angiotensin receptor blockers, diuretics, digoxin, and antihyperlipidemics.   Congestive Heart Failure -Discuss the definition of CHF, how to live with CHF, the signs and symptoms of CHF, and how keep track of weight and sodium intake.   Heart Disease and Intimacy -Discus the effect sexual activity has on the heart, how changes occur during intimacy as we age, and safety during sexual activity. Flowsheet Row CARDIAC REHAB PHASE II EXERCISE from 04/01/2023 in [place] CARDIAC REHABILITATION  Date 04/01/23  Educator HB  Instruction  Review Code 1- Verbalizes Understanding       Smoking Cessation / COPD -Discuss different methods  to quit smoking, the health benefits of quitting smoking, and the definition of COPD.   Nutrition I: Fats -Discuss the types of cholesterol, what cholesterol does to the heart, and how cholesterol levels can be controlled.   Nutrition II: Labels -Discuss the different components of food labels and how to read food label   Heart Parts/Heart Disease and PAD -Discuss the anatomy of the heart, the pathway of blood circulation through the heart, and these are affected by heart disease.   Stress I: Signs and Symptoms -Discuss the causes of stress, how stress may lead to anxiety and depression, and ways to limit stress.   Stress II: Relaxation -Discuss different types of relaxation techniques to limit stress.   Warning Signs of Stroke / TIA -Discuss definition of a stroke, what the signs and symptoms are of a stroke, and how to identify when someone is having stroke.   Knowledge Questionnaire Score:  Knowledge Questionnaire Score - 03/12/23 0906       Knowledge Questionnaire Score   Pre Score 21/26             Core Components/Risk Factors/Patient Goals at Admission:  Personal Goals and Risk Factors at Admission - 03/17/23 1551       Core Components/Risk Factors/Patient Goals on Admission    Weight Management Yes;Weight Maintenance;Weight Loss    Intervention Weight Management: Develop a combined nutrition and exercise program designed to reach desired caloric intake, while maintaining appropriate intake of nutrient and fiber, sodium and fats, and appropriate energy expenditure required for the weight goal.;Weight Management: Provide education and appropriate resources to help participant work on and attain dietary goals.    Admit Weight 205 lb 3.2 oz (93.1 kg)    Goal Weight: Short Term 200 lb (90.7 kg)    Goal Weight: Long Term 198 lb (89.8 kg)    Expected Outcomes Short  Term: Continue to assess and modify interventions until short term weight is achieved;Long Term: Adherence to nutrition and physical activity/exercise program aimed toward attainment of established weight goal;Weight Maintenance: Understanding of the daily nutrition guidelines, which includes 25-35% calories from fat, 7% or less cal from saturated fats, less than 200mg  cholesterol, less than 1.5gm of sodium, & 5 or more servings of fruits and vegetables daily;Weight Loss: Understanding of general recommendations for a balanced deficit meal plan, which promotes 1-2 lb weight loss per week and includes a negative energy balance of [phone] kcal/d;Understanding recommendations for meals to include 15-35% energy as protein, 25-35% energy from fat, 35-60% energy from carbohydrates, less than 200mg  of dietary cholesterol, 20-35 gm of total fiber daily;Understanding of distribution of calorie intake throughout the day with the consumption of 4-5 meals/snacks    Diabetes Yes    Intervention Provide education about signs/symptoms and action to take for hypo/hyperglycemia.;Provide education about proper nutrition, including hydration, and aerobic/resistive exercise prescription along with prescribed medications to achieve blood glucose in normal ranges: Fasting glucose 65-99 mg/dL    Expected Outcomes Short Term: Participant verbalizes understanding of the signs/symptoms and immediate care of hyper/hypoglycemia, proper foot care and importance of medication, aerobic/resistive exercise and nutrition plan for blood glucose control.;Long Term: Attainment of HbA1C < 7%.    Heart Failure Yes    Intervention Provide a combined exercise and nutrition program that is supplemented with education, support and counseling about heart failure. Directed toward relieving symptoms such as shortness of breath, decreased exercise tolerance, and extremity edema.    Expected Outcomes Improve functional capacity of life;Short term:  Attendance  in program 2-3 days a week with increased exercise capacity. Reported lower sodium intake. Reported increased fruit and vegetable intake. Reports medication compliance.;Short term: Daily weights obtained and reported for increase. Utilizing diuretic protocols set by physician.;Long term: Adoption of self-care skills and reduction of barriers for early signs and symptoms recognition and intervention leading to self-care maintenance.    Hypertension Yes    Intervention Provide education on lifestyle modifcations including regular physical activity/exercise, weight management, moderate sodium restriction and increased consumption of fresh fruit, vegetables, and low fat dairy, alcohol moderation, and smoking cessation.;Monitor prescription use compliance.    Expected Outcomes Short Term: Continued assessment and intervention until BP is < 140/59mm HG in hypertensive participants. < 130/70mm HG in hypertensive participants with diabetes, heart failure or chronic kidney disease.;Long Term: Maintenance of blood pressure at goal levels.    Lipids Yes    Intervention Provide education and support for participant on nutrition & aerobic/resistive exercise along with prescribed medications to achieve LDL 70mg , HDL >40mg .    Expected Outcomes Short Term: Participant states understanding of desired cholesterol values and is compliant with medications prescribed. Participant is following exercise prescription and nutrition guidelines.;Long Term: Cholesterol controlled with medications as prescribed, with individualized exercise RX and with personalized nutrition plan. Value goals: LDL < 70mg , HDL > 40 mg.             Core Components/Risk Factors/Patient Goals Review:    Core Components/Risk Factors/Patient Goals at Discharge (Final Review):    ITP Comments:  ITP Comments     Row Name 03/17/23 1554 03/23/23 1040 04/07/23 1549       ITP Comments Patient arrived for 1st  visit/orientation/education at 1400. Patient was referred to CR by Dr. Zoila Shutter due to Chronic systolic HF. During orientation advised patient on arrival and appointment times what to wear, what to do before, during and after exercise. Reviewed attendance and class policy.  Pt is scheduled to return Cardiac Rehab on Tuesday 03/23/23 at 1330. Pt was advised to come to class 15 minutes before class starts.  Discussed RPE/Dpysnea scales. Patient participated in warm up stretches. Patient was able to complete 6 minute walk test.  Telemetry:Ventricular Paced Rhythm. Patient was measured for the equipment. Discussed equipment safety with patient. Took patient pre-anthropometric measurements. Patient finished visit at 1510. First full day of exercise!  Patient was oriented to gym and equipment including functions, settings, policies, and procedures.  Patient's individual exercise prescription and treatment plan were reviewed.  All starting workloads were established based on the results of the 6 minute walk test done at initial orientation visit.  The plan for exercise progression was also introduced and progression will be customized based on patient's performance and goals. 30 day review completed. ITP sent to Dr. Dina Rich, Medical Director of Cardiac Rehab. Continue with ITP unless changes are made by physician.              Comments: 30 day review

## 2023-04-08 ENCOUNTER — Encounter (HOSPITAL_COMMUNITY)
Admission: RE | Admit: 2023-04-08 | Discharge: 2023-04-08 | Disposition: A | Discharge status: Home / Self Care | Payer: Medicare Other | Source: Ambulatory Visit | Attending: Internal Medicine | Admitting: Internal Medicine

## 2023-04-08 DIAGNOSIS — I5022 Chronic systolic (congestive) heart failure: Secondary | ICD-10-CM | POA: Diagnosis not present

## 2023-04-08 DIAGNOSIS — Z95810 Presence of automatic (implantable) cardiac defibrillator: Secondary | ICD-10-CM | POA: Diagnosis not present

## 2023-04-08 NOTE — Progress Notes (Signed)
Daily Session Note  Patient Details  Name: [name] [name] MRN: [id number] Date of Birth: [birth year] Referring Provider:   Flowsheet Row CARDIAC REHAB PHASE II ORIENTATION from 03/17/2023 in [facility] CARDIAC REHABILITATION  Referring Provider Zoila Shutter MD       Encounter Date: 04/08/2023  Check In:  Session Check In - 04/08/23 1015       Check-In   Supervising physician immediately available to respond to emergencies See telemetry face sheet for immediately available ER MD    Location [facility]    Staff Present Rodena Medin, RN, BSN;Hillary Troutman BSN, RN;Heather Fredric Mare, BS, Exercise Physiologist    Virtual Visit No    Medication changes reported     No    Fall or balance concerns reported    No    Warm-up and Cool-down Performed on first and last piece of equipment    Resistance Training Performed Yes    VAD Patient? No    PAD/SET Patient? No      Pain Assessment   Currently in Pain? No/denies    Multiple Pain Sites No             Capillary Blood Glucose: No results found for this or any previous visit (from the past 24 hour(s)).    Social History   Tobacco Use  Smoking Status Former   Types: Cigars   Quit date: 09/07/2011   Years since quitting: 11.5  Smokeless Tobacco Former   Quit date: 02/28/2012  Tobacco Comments   Former smoker 02/16/23    Goals Met:  Independence with exercise equipment Exercise tolerated well No report of concerns or symptoms today Strength training completed today  Goals Unmet:  Not Applicable  Comments: Pt able to follow exercise prescription today without complaint.  Will continue to monitor for progression.    Dr. Dina Rich is Medical Director for [facility] Cardiac Rehab

## 2023-04-08 NOTE — Patient Instructions (Signed)
[name] [name]  04/08/2023     @PREFPERIOPPHARMACY @   Your procedure is scheduled on  04/14/2023.   Report to [facility] at  0900  A.M.   Call this number if you have problems the morning of surgery:  [phone]  If you experience any cold or flu symptoms such as cough, fever, chills, shortness of breath, etc. between now and your scheduled surgery, please notify us at the above number.   Remember:     Your last dose of jardiance should be on 04/10/2023.     Your last dose of eliquis should be on 04/11/2023.       DO NOT take any medications for diabetes the morning of your procedure.    Follow the diet and prep instructions given to you by the office.    You may drink clear liquids until 0700 am on 04/14/2023.    Clear liquids allowed are:                    Water, Black Coffee Only (No creamer, milk or cream, including half & half and powdered creamer), and Clear Sports drink (No red color; diabetics please choose diet or no sugar options)     Take these medicines the morning of surgery with A SIP OF WATER                   pacerone, carvedilol, gabapentin.     Do not wear jewelry, make-up or nail polish, including gel polish,  artificial nails, or any other type of covering on natural nails (fingers and  toes).  Do not wear lotions, powders, or perfumes, or deodorant.  Do not shave 48 hours prior to surgery.  Men may shave face and neck.  Do not bring valuables to the hospital.  [facility] is not responsible for any belongings or valuables.  Contacts, dentures or bridgework may not be worn into surgery.  Leave your suitcase in the car.  After surgery it may be brought to your room.  For patients admitted to the hospital, discharge time will be determined by your treatment team.  Patients discharged the day of surgery will not be allowed to drive home and must have someone with them for 24 hours.    Special instructions:   DO NOT smoke  tobacco or vape for 24 hours before your procedure.  Please read over the following fact sheets that you were given. Anesthesia Post-op Instructions and Care and Recovery After Surgery      Upper Endoscopy, Adult, Care After After the procedure, it is common to have a sore throat. It is also common to have: Mild stomach pain or discomfort. Bloating. Nausea. Follow these instructions at home: The instructions below may help you care for yourself at home. Your health care provider may give you more instructions. If you have questions, ask your health care provider. If you were given a sedative during the procedure, it can affect you for several hours. Do not drive or operate machinery until your health care provider says that it is safe. If you will be going home right after the procedure, plan to have a responsible adult: Take you home from the hospital or clinic. You will not be allowed to drive. Care for you for the time you are told. Follow instructions from your health care provider about what you may eat and drink. Return to your normal activities as told by your  health care provider. Ask your health care provider what activities are safe for you. Take over-the-counter and prescription medicines only as told by your health care provider. Contact a health care provider if you: Have a sore throat that lasts longer than one day. Have trouble swallowing. Have a fever. Get help right away if you: Vomit blood or your vomit looks like coffee grounds. Have bloody, black, or tarry stools. Have a very bad sore throat or you cannot swallow. Have difficulty breathing or very bad pain in your chest or abdomen. These symptoms may be an emergency. Get help right away. Call 911. Do not wait to see if the symptoms will go away. Do not drive yourself to the hospital. Summary After the procedure, it is common to have a sore throat, mild stomach discomfort, bloating, and nausea. If you were given  a sedative during the procedure, it can affect you for several hours. Do not drive until your health care provider says that it is safe. Follow instructions from your health care provider about what you may eat and drink. Return to your normal activities as told by your health care provider. This information is not intended to replace advice given to you by your health care provider. Make sure you discuss any questions you have with your health care provider. Document Revised: 09/24/2021 Document Reviewed: 09/24/2021 Elsevier Patient Education  2024 [employer] Colonoscopy, Adult, Care After The following information offers guidance on how to care for yourself after your procedure. Your health care provider may also give you more specific instructions. If you have problems or questions, contact your health care provider. What can I expect after the procedure? After the procedure, it is common to have: A small amount of blood in your stool for 24 hours after the procedure. Some gas. Mild cramping or bloating of your abdomen. Follow these instructions at home: Eating and drinking  Drink enough fluid to keep your urine pale yellow. Follow instructions from your health care provider about eating or drinking restrictions. Resume your normal diet as told by your health care provider. Avoid heavy or fried foods that are hard to digest. Activity Rest as told by your health care provider. Avoid sitting for a long time without moving. Get up to take short walks every 1-2 hours. This is important to improve blood flow and breathing. Ask for help if you feel weak or unsteady. Return to your normal activities as told by your health care provider. Ask your health care provider what activities are safe for you. Managing cramping and bloating  Try walking around when you have cramps or feel bloated. If directed, apply heat to your abdomen as told by your health care provider. Use the heat source that  your health care provider recommends, such as a moist heat pack or a heating pad. Place a towel between your skin and the heat source. Leave the heat on for 20-30 minutes. Remove the heat if your skin turns bright red. This is especially important if you are unable to feel pain, heat, or cold. You have a greater risk of getting burned. General instructions If you were given a sedative during the procedure, it can affect you for several hours. Do not drive or operate machinery until your health care provider says that it is safe. For the first 24 hours after the procedure: Do not sign important documents. Do not drink alcohol. Do your regular daily activities at a slower pace than normal. Eat soft foods that are  easy to digest. Take over-the-counter and prescription medicines only as told by your health care provider. Keep all follow-up visits. This is important. Contact a health care provider if: You have blood in your stool 2-3 days after the procedure. Get help right away if: You have more than a small spotting of blood in your stool. You have large blood clots in your stool. You have swelling of your abdomen. You have nausea or vomiting. You have a fever. You have increasing pain in your abdomen that is not relieved with medicine. These symptoms may be an emergency. Get help right away. Call 911. Do not wait to see if the symptoms will go away. Do not drive yourself to the hospital. Summary After the procedure, it is common to have a small amount of blood in your stool. You may also have mild cramping and bloating of your abdomen. If you were given a sedative during the procedure, it can affect you for several hours. Do not drive or operate machinery until your health care provider says that it is safe. Get help right away if you have a lot of blood in your stool, nausea or vomiting, a fever, or increased pain in your abdomen. This information is not intended to replace advice given  to you by your health care provider. Make sure you discuss any questions you have with your health care provider. Document Revised: 07/28/2022 Document Reviewed: 02/05/2021 Elsevier Patient Education  2024 Elsevier Inc. Monitored Anesthesia Care, Care After The following information offers guidance on how to care for yourself after your procedure. Your health care provider may also give you more specific instructions. If you have problems or questions, contact your health care provider. What can I expect after the procedure? After the procedure, it is common to have: Tiredness. Little or no memory about what happened during or after the procedure. Impaired judgment when it comes to making decisions. Nausea or vomiting. Some trouble with balance. Follow these instructions at home: For the time period you were told by your health care provider:  Rest. Do not participate in activities where you could fall or become injured. Do not drive or use machinery. Do not drink alcohol. Do not take sleeping pills or medicines that cause drowsiness. Do not make important decisions or sign legal documents. Do not take care of children on your own. Medicines Take over-the-counter and prescription medicines only as told by your health care provider. If you were prescribed antibiotics, take them as told by your health care provider. Do not stop using the antibiotic even if you start to feel better. Eating and drinking Follow instructions from your health care provider about what you may eat and drink. Drink enough fluid to keep your urine pale yellow. If you vomit: Drink clear fluids slowly and in small amounts as you are able. Clear fluids include water, ice chips, low-calorie sports drinks, and fruit juice that has water added to it (diluted fruit juice). Eat light and bland foods in small amounts as you are able. These foods include bananas, applesauce, rice, lean meats, toast, and crackers. General  instructions  Have a responsible adult stay with you for the time you are told. It is important to have someone help care for you until you are awake and alert. If you have sleep apnea, surgery and some medicines can increase your risk for breathing problems. Follow instructions from your health care provider about wearing your sleep device: When you are sleeping. This includes during daytime naps.  While taking prescription pain medicines, sleeping medicines, or medicines that make you drowsy. Do not use any products that contain nicotine or tobacco. These products include cigarettes, chewing tobacco, and vaping devices, such as e-cigarettes. If you need help quitting, ask your health care provider. Contact a health care provider if: You feel nauseous or vomit every time you eat or drink. You feel light-headed. You are still sleepy or having trouble with balance after 24 hours. You get a rash. You have a fever. You have redness or swelling around the IV site. Get help right away if: You have trouble breathing. You have new confusion after you get home. These symptoms may be an emergency. Get help right away. Call 911. Do not wait to see if the symptoms will go away. Do not drive yourself to the hospital. This information is not intended to replace advice given to you by your health care provider. Make sure you discuss any questions you have with your health care provider. Document Revised: 11/10/2021 Document Reviewed: 11/10/2021 Elsevier Patient Education  2024 [employer].

## 2023-04-09 ENCOUNTER — Encounter (HOSPITAL_COMMUNITY)
Admission: RE | Admit: 2023-04-09 | Discharge: 2023-04-09 | Disposition: A | Discharge status: Home / Self Care | Payer: Medicare Other | Source: Ambulatory Visit | Attending: Internal Medicine | Admitting: Internal Medicine

## 2023-04-09 ENCOUNTER — Encounter (HOSPITAL_COMMUNITY): Payer: Self-pay

## 2023-04-09 VITALS — BP 137/72 | HR 67 | Temp 98.7°F | Resp 16 | Ht 74.0 in | Wt 203.7 lb

## 2023-04-09 DIAGNOSIS — E119 Type 2 diabetes mellitus without complications: Secondary | ICD-10-CM | POA: Insufficient documentation

## 2023-04-09 DIAGNOSIS — D649 Anemia, unspecified: Secondary | ICD-10-CM | POA: Insufficient documentation

## 2023-04-09 DIAGNOSIS — Z01812 Encounter for preprocedural laboratory examination: Secondary | ICD-10-CM | POA: Insufficient documentation

## 2023-04-09 HISTORY — DX: Presence of automatic (implantable) cardiac defibrillator: Z95.810

## 2023-04-09 LAB — CBC WITH DIFFERENTIAL/PLATELET
Abs Immature Granulocytes: 0.05 10*3/uL (ref 0.00–0.07)
Basophils Absolute: 0 10*3/uL (ref 0.0–0.1)
Basophils Relative: 0 %
Eosinophils Absolute: 0.2 10*3/uL (ref 0.0–0.5)
Eosinophils Relative: 3 %
HCT: 35.9 % — ABNORMAL LOW (ref 39.0–52.0)
Hemoglobin: 11.6 g/dL — ABNORMAL LOW (ref 13.0–17.0)
Immature Granulocytes: 1 %
Lymphocytes Relative: 20 %
Lymphs Abs: 1.5 10*3/uL (ref 0.7–4.0)
MCH: 28.1 pg (ref 26.0–34.0)
MCHC: 32.3 g/dL (ref 30.0–36.0)
MCV: 86.9 fL (ref 80.0–100.0)
Monocytes Absolute: 0.6 10*3/uL (ref 0.1–1.0)
Monocytes Relative: 8 %
Neutro Abs: 5.1 10*3/uL (ref 1.7–7.7)
Neutrophils Relative %: 68 %
Platelets: 214 10*3/uL (ref 150–400)
RBC: 4.13 MIL/uL — ABNORMAL LOW (ref 4.22–5.81)
RDW: 15.5 % (ref 11.5–15.5)
WBC: 7.5 10*3/uL (ref 4.0–10.5)
nRBC: 0 % (ref 0.0–0.2)

## 2023-04-09 LAB — BASIC METABOLIC PANEL
Anion gap: 12 (ref 5–15)
BUN: 22 mg/dL (ref 8–23)
CO2: 24 mmol/L (ref 22–32)
Calcium: 8.6 mg/dL — ABNORMAL LOW (ref 8.9–10.3)
Chloride: 100 mmol/L (ref 98–111)
Creatinine, Ser: 1.06 mg/dL (ref 0.61–1.24)
GFR, Estimated: >60 mL/min (ref >60)
Glucose, Bld: 265 mg/dL — ABNORMAL HIGH (ref 70–99)
Potassium: 3.5 mmol/L (ref 3.5–5.1)
Sodium: 136 mmol/L (ref 135–145)

## 2023-04-12 ENCOUNTER — Ambulatory Visit (INDEPENDENT_AMBULATORY_CARE_PROVIDER_SITE_OTHER): Payer: Medicare Other | Admitting: Otolaryngology

## 2023-04-12 ENCOUNTER — Encounter (INDEPENDENT_AMBULATORY_CARE_PROVIDER_SITE_OTHER): Payer: Medicare Other

## 2023-04-12 ENCOUNTER — Encounter (INDEPENDENT_AMBULATORY_CARE_PROVIDER_SITE_OTHER): Payer: Self-pay

## 2023-04-12 ENCOUNTER — Ambulatory Visit: Payer: Medicare Other | Admitting: Neurology

## 2023-04-12 VITALS — Ht 74.0 in | Wt 206.0 lb

## 2023-04-12 DIAGNOSIS — Z95810 Presence of automatic (implantable) cardiac defibrillator: Secondary | ICD-10-CM | POA: Diagnosis not present

## 2023-04-12 DIAGNOSIS — H9312 Tinnitus, left ear: Secondary | ICD-10-CM

## 2023-04-12 DIAGNOSIS — H9042 Sensorineural hearing loss, unilateral, left ear, with unrestricted hearing on the contralateral side: Secondary | ICD-10-CM | POA: Diagnosis not present

## 2023-04-12 DIAGNOSIS — I5022 Chronic systolic (congestive) heart failure: Secondary | ICD-10-CM | POA: Diagnosis not present

## 2023-04-12 NOTE — Progress Notes (Signed)
EPIC Encounter for ICM Monitoring  Patient Name: [name] [name] is a 77 y.o. male Date: 04/12/2023 Primary Care Physican: [name], MD Primary Cardiologist: [facility] [place]        Electrophysiologist: [name] Bi-V Pacing: 99.1%         09/21/2022 Weight: 213 lbs 10/06/2022 Weight: 219 lbs  11/17/2022 Weight: 212.5 lbs 12/22/2022 Weight: 211 lbs 01/13/2023 Weight: 209 lbs   Clinical Status Since 10-Mar-2023 Time in AT/AF  0.0 hr/day (0.0%)                                                          Spoke with patient and heart failure questions reviewed.  Transmission results reviewed.  Pt reports feeling well and asymptomatic for fluid accumulaiton   Optivol thoracic impedance suggesting intermittent possible fluid accumulation starting 9/28 and trending toward baseline.    Prescribed:  Furosemide 40 mg take 1 tablet(s) (40 mg total) by mouth every morning and at bedtime.  Dr [name] advised to hold Furosemide if BP is <120   Labs: 01/05/2023 Creatinine 1.27, BUN 22, Potassium 4.2, Sodium 142, GFR 58 12/02/2022 Creatinine 1.20, BUN 17, Potassium 4.0, Sodium 137, GFR >60  11/11/2022 Creatinine 1.18, BUN 20, Potassium 3.6, Sodium 134, GFR >60  11/10/2022 Creatinine 1.25, BUN 21, Potassium 3.8, Sodium 139, GFR 59  11/09/2022 Creatinine 1.39, BUN 26, Potassium 3.4, Sodium 136, GFR 52 10/12/2022 Creatinine 1.04, BUN 17, Potassium 4.8, Sodium 144  10/02/2022 Creatinine 1.13, BUN 19, Potassium 4.3, Sodium 141, GFR 67  09/07/2022 Creatinine 0.98, BUN 20, Potassium 4.2, Sodium 144, GFR 79  A complete set of results can be found in Results Review.   Recommendations:   Encouraged to call if experiencing any fluid symptoms.    Follow-up plan: ICM clinic phone appointment on 04/20/2023 to recheck fluid levels.   91 day device clinic remote transmission 05/03/2023.     EP/Cardiology Office Visits:  Recall 08/02/2023 with Dr [name].   Recall 08/25/2023 with Dr [name].   Copy of ICM check sent to Dr.  [name].     3 month ICM trend: 04/12/2023.    12-14 Month ICM trend:     Karie Soda, RN 04/12/2023 3:47 PM

## 2023-04-13 ENCOUNTER — Encounter (HOSPITAL_COMMUNITY): Payer: Medicare Other

## 2023-04-13 NOTE — Progress Notes (Signed)
Patient ID: [name] [name], male   DOB: 11-11-1945, 77 y.o.   MRN: [id number]  Procedure: Left chemical labyrinthotomy with dexamethasone infusion (#2).    Indication: Left-sided sudden hearing loss and tinnitus, not responding to oral steroid treatment.    Descriptions: The patient was placed supine on the operating table.  Under the operating microscope, the left ear canal was cleaned of all cerumen.   Dexamethasone was infused into the middle ear space via the ventilating tube.  The patient was placed in the left ear up position for 15 minutes.  The patient tolerated the procedure well.    Follow-up care: The patient will return in one week for repeat dexamethasone infusion.

## 2023-04-14 ENCOUNTER — Ambulatory Visit (HOSPITAL_BASED_OUTPATIENT_CLINIC_OR_DEPARTMENT_OTHER)
Admission: RE | Admit: 2023-04-14 | Discharge: 2023-04-14 | Disposition: A | Discharge status: Home / Self Care | Payer: Medicare Other | Source: Home / Self Care | Attending: Internal Medicine | Admitting: Internal Medicine

## 2023-04-14 ENCOUNTER — Encounter (HOSPITAL_COMMUNITY): Payer: Self-pay | Admitting: Internal Medicine

## 2023-04-14 ENCOUNTER — Telehealth: Payer: Self-pay | Admitting: Cardiology

## 2023-04-14 ENCOUNTER — Ambulatory Visit (HOSPITAL_COMMUNITY): Payer: Medicare Other | Admitting: Anesthesiology

## 2023-04-14 ENCOUNTER — Encounter (HOSPITAL_COMMUNITY)
Admission: RE | Disposition: A | Discharge status: Home / Self Care | Payer: Self-pay | Source: Home / Self Care | Attending: Internal Medicine

## 2023-04-14 DIAGNOSIS — I255 Ischemic cardiomyopathy: Secondary | ICD-10-CM | POA: Diagnosis present

## 2023-04-14 DIAGNOSIS — R634 Abnormal weight loss: Secondary | ICD-10-CM

## 2023-04-14 DIAGNOSIS — R001 Bradycardia, unspecified: Secondary | ICD-10-CM | POA: Diagnosis not present

## 2023-04-14 DIAGNOSIS — K5730 Diverticulosis of large intestine without perforation or abscess without bleeding: Secondary | ICD-10-CM

## 2023-04-14 DIAGNOSIS — K91840 Postprocedural hemorrhage and hematoma of a digestive system organ or structure following a digestive system procedure: Secondary | ICD-10-CM | POA: Diagnosis not present

## 2023-04-14 DIAGNOSIS — K2950 Unspecified chronic gastritis without bleeding: Secondary | ICD-10-CM | POA: Diagnosis not present

## 2023-04-14 DIAGNOSIS — Z7901 Long term (current) use of anticoagulants: Secondary | ICD-10-CM | POA: Insufficient documentation

## 2023-04-14 DIAGNOSIS — M1712 Unilateral primary osteoarthritis, left knee: Secondary | ICD-10-CM | POA: Diagnosis present

## 2023-04-14 DIAGNOSIS — I5042 Chronic combined systolic (congestive) and diastolic (congestive) heart failure: Secondary | ICD-10-CM | POA: Diagnosis not present

## 2023-04-14 DIAGNOSIS — Y838 Other surgical procedures as the cause of abnormal reaction of the patient, or of later complication, without mention of misadventure at the time of the procedure: Secondary | ICD-10-CM | POA: Diagnosis not present

## 2023-04-14 DIAGNOSIS — C187 Malignant neoplasm of sigmoid colon: Secondary | ICD-10-CM | POA: Diagnosis not present

## 2023-04-14 DIAGNOSIS — D126 Benign neoplasm of colon, unspecified: Secondary | ICD-10-CM

## 2023-04-14 DIAGNOSIS — E663 Overweight: Secondary | ICD-10-CM | POA: Diagnosis present

## 2023-04-14 DIAGNOSIS — R195 Other fecal abnormalities: Secondary | ICD-10-CM | POA: Diagnosis not present

## 2023-04-14 DIAGNOSIS — E119 Type 2 diabetes mellitus without complications: Secondary | ICD-10-CM | POA: Insufficient documentation

## 2023-04-14 DIAGNOSIS — G4730 Sleep apnea, unspecified: Secondary | ICD-10-CM | POA: Insufficient documentation

## 2023-04-14 DIAGNOSIS — D649 Anemia, unspecified: Secondary | ICD-10-CM | POA: Insufficient documentation

## 2023-04-14 DIAGNOSIS — I951 Orthostatic hypotension: Secondary | ICD-10-CM | POA: Diagnosis not present

## 2023-04-14 DIAGNOSIS — B3781 Candidal esophagitis: Secondary | ICD-10-CM | POA: Diagnosis not present

## 2023-04-14 DIAGNOSIS — K635 Polyp of colon: Secondary | ICD-10-CM

## 2023-04-14 DIAGNOSIS — I2510 Atherosclerotic heart disease of native coronary artery without angina pectoris: Secondary | ICD-10-CM | POA: Diagnosis not present

## 2023-04-14 DIAGNOSIS — J9811 Atelectasis: Secondary | ICD-10-CM | POA: Diagnosis not present

## 2023-04-14 DIAGNOSIS — I5043 Acute on chronic combined systolic (congestive) and diastolic (congestive) heart failure: Secondary | ICD-10-CM | POA: Diagnosis not present

## 2023-04-14 DIAGNOSIS — Z95810 Presence of automatic (implantable) cardiac defibrillator: Secondary | ICD-10-CM | POA: Insufficient documentation

## 2023-04-14 DIAGNOSIS — K5900 Constipation, unspecified: Secondary | ICD-10-CM | POA: Diagnosis not present

## 2023-04-14 DIAGNOSIS — J90 Pleural effusion, not elsewhere classified: Secondary | ICD-10-CM | POA: Diagnosis not present

## 2023-04-14 DIAGNOSIS — E1169 Type 2 diabetes mellitus with other specified complication: Secondary | ICD-10-CM | POA: Diagnosis not present

## 2023-04-14 DIAGNOSIS — I110 Hypertensive heart disease with heart failure: Secondary | ICD-10-CM | POA: Diagnosis present

## 2023-04-14 DIAGNOSIS — Z87891 Personal history of nicotine dependence: Secondary | ICD-10-CM | POA: Insufficient documentation

## 2023-04-14 DIAGNOSIS — K3189 Other diseases of stomach and duodenum: Secondary | ICD-10-CM

## 2023-04-14 DIAGNOSIS — K625 Hemorrhage of anus and rectum: Secondary | ICD-10-CM | POA: Diagnosis not present

## 2023-04-14 DIAGNOSIS — Z1211 Encounter for screening for malignant neoplasm of colon: Secondary | ICD-10-CM

## 2023-04-14 DIAGNOSIS — D696 Thrombocytopenia, unspecified: Secondary | ICD-10-CM | POA: Diagnosis present

## 2023-04-14 DIAGNOSIS — D127 Benign neoplasm of rectosigmoid junction: Secondary | ICD-10-CM

## 2023-04-14 DIAGNOSIS — C19 Malignant neoplasm of rectosigmoid junction: Secondary | ICD-10-CM

## 2023-04-14 DIAGNOSIS — I509 Heart failure, unspecified: Secondary | ICD-10-CM | POA: Insufficient documentation

## 2023-04-14 DIAGNOSIS — D120 Benign neoplasm of cecum: Secondary | ICD-10-CM | POA: Diagnosis not present

## 2023-04-14 DIAGNOSIS — D123 Benign neoplasm of transverse colon: Secondary | ICD-10-CM | POA: Diagnosis not present

## 2023-04-14 DIAGNOSIS — E1165 Type 2 diabetes mellitus with hyperglycemia: Secondary | ICD-10-CM | POA: Diagnosis present

## 2023-04-14 DIAGNOSIS — K8020 Calculus of gallbladder without cholecystitis without obstruction: Secondary | ICD-10-CM | POA: Diagnosis not present

## 2023-04-14 DIAGNOSIS — I10 Essential (primary) hypertension: Secondary | ICD-10-CM | POA: Diagnosis not present

## 2023-04-14 DIAGNOSIS — R627 Adult failure to thrive: Secondary | ICD-10-CM | POA: Diagnosis not present

## 2023-04-14 DIAGNOSIS — R188 Other ascites: Secondary | ICD-10-CM | POA: Diagnosis not present

## 2023-04-14 DIAGNOSIS — N400 Enlarged prostate without lower urinary tract symptoms: Secondary | ICD-10-CM | POA: Diagnosis present

## 2023-04-14 DIAGNOSIS — D122 Benign neoplasm of ascending colon: Secondary | ICD-10-CM

## 2023-04-14 DIAGNOSIS — D125 Benign neoplasm of sigmoid colon: Secondary | ICD-10-CM | POA: Diagnosis present

## 2023-04-14 DIAGNOSIS — Z6826 Body mass index (BMI) 26.0-26.9, adult: Secondary | ICD-10-CM | POA: Insufficient documentation

## 2023-04-14 DIAGNOSIS — C189 Malignant neoplasm of colon, unspecified: Secondary | ICD-10-CM | POA: Diagnosis not present

## 2023-04-14 DIAGNOSIS — K633 Ulcer of intestine: Secondary | ICD-10-CM | POA: Diagnosis not present

## 2023-04-14 DIAGNOSIS — E7800 Pure hypercholesterolemia, unspecified: Secondary | ICD-10-CM | POA: Diagnosis present

## 2023-04-14 DIAGNOSIS — T185XXA Foreign body in anus and rectum, initial encounter: Secondary | ICD-10-CM | POA: Diagnosis not present

## 2023-04-14 DIAGNOSIS — K922 Gastrointestinal hemorrhage, unspecified: Secondary | ICD-10-CM | POA: Diagnosis not present

## 2023-04-14 HISTORY — PX: POLYPECTOMY: SHX149

## 2023-04-14 HISTORY — PX: SUBMUCOSAL TATTOO INJECTION: SHX6856

## 2023-04-14 HISTORY — PX: ESOPHAGEAL BRUSHING: SHX6842

## 2023-04-14 HISTORY — PX: SUBMUCOSAL LIFTING INJECTION: SHX6855

## 2023-04-14 HISTORY — PX: HEMOSTASIS CLIP PLACEMENT: SHX6857

## 2023-04-14 HISTORY — PX: ESOPHAGOGASTRODUODENOSCOPY (EGD) WITH PROPOFOL: SHX5813

## 2023-04-14 HISTORY — PX: BIOPSY: SHX5522

## 2023-04-14 HISTORY — PX: COLONOSCOPY WITH PROPOFOL: SHX5780

## 2023-04-14 HISTORY — PX: SCLEROTHERAPY: SHX6841

## 2023-04-14 LAB — KOH PREP

## 2023-04-14 LAB — GLUCOSE, CAPILLARY: Glucose-Capillary: 268 mg/dL — ABNORMAL HIGH (ref 70–99)

## 2023-04-14 SURGERY — COLONOSCOPY WITH PROPOFOL
Anesthesia: General

## 2023-04-14 MED ORDER — LACTATED RINGERS IV SOLN: INTRAVENOUS | Status: DC

## 2023-04-14 MED ORDER — PROPOFOL 10 MG/ML IV BOLUS
INTRAVENOUS | Status: DC | PRN
  Administered 2023-04-14: 100 mg via INTRAVENOUS

## 2023-04-14 MED ORDER — STERILE WATER FOR IRRIGATION IR SOLN
Status: DC | PRN
  Administered 2023-04-14: 100 mL

## 2023-04-14 MED ORDER — PROPOFOL 500 MG/50ML IV EMUL
INTRAVENOUS | Status: DC | PRN
  Administered 2023-04-14: 200 ug/kg/min via INTRAVENOUS

## 2023-04-14 MED ORDER — EPHEDRINE SULFATE (PRESSORS) 50 MG/ML IJ SOLN
INTRAMUSCULAR | Status: DC | PRN
Start: 2023-04-14 — End: 2023-04-14
  Administered 2023-04-14: 10 mg via INTRAVENOUS
  Administered 2023-04-14 (×2): 15 mg via INTRAVENOUS
  Administered 2023-04-14: 5 mg via INTRAVENOUS

## 2023-04-14 MED ORDER — LIDOCAINE HCL (CARDIAC) PF 100 MG/5ML IV SOSY
PREFILLED_SYRINGE | INTRAVENOUS | Status: DC | PRN
Start: 2023-04-14 — End: 2023-04-14
  Administered 2023-04-14: 60 mg via INTRATRACHEAL

## 2023-04-14 MED ORDER — PHENYLEPHRINE HCL (PRESSORS) 10 MG/ML IV SOLN
INTRAVENOUS | Status: DC | PRN
Start: 2023-04-14 — End: 2023-04-14
  Administered 2023-04-14 (×2): 160 ug via INTRAVENOUS
  Administered 2023-04-14: 320 ug via INTRAVENOUS
  Administered 2023-04-14 (×5): 160 ug via INTRAVENOUS

## 2023-04-14 MED ORDER — SODIUM CHLORIDE 0.9 % IV SOLN: INTRAVENOUS | Status: DC | PRN

## 2023-04-14 NOTE — Transfer of Care (Signed)
Immediate Anesthesia Transfer of Care Note  Patient: [name] [name]  Procedure(s) Performed: COLONOSCOPY WITH PROPOFOL ESOPHAGOGASTRODUODENOSCOPY (EGD) WITH PROPOFOL BIOPSY ESOPHAGEAL BRUSHING POLYPECTOMY INTESTINAL SUBMUCOSAL TATTOO INJECTION SCLEROTHERAPY HEMOSTASIS CLIP PLACEMENT SUBMUCOSAL LIFTING INJECTION  Patient Location: Endoscopy Unit  Anesthesia Type:General  Level of Consciousness: drowsy  Airway & Oxygen Therapy: Patient Spontanous Breathing  Post-op Assessment: Report given to RN and Post -op Vital signs reviewed and stable  Post vital signs: Reviewed and stable  Last Vitals:  Vitals Value Taken Time  BP 126/63 04/14/23 1132  Temp 36.6 C 04/14/23 1132  Pulse 61 04/14/23 1132  Resp 18 04/14/23 1132  SpO2 100 % 04/14/23 1132    Last Pain:  Vitals:   04/14/23 1132  TempSrc: Oral  PainSc: 0-No pain         Complications: No notable events documented.

## 2023-04-14 NOTE — Telephone Encounter (Signed)
Pt's daughter would like a callback regarding whether pt is able to wear battery powered heating vest. Please advise

## 2023-04-14 NOTE — Anesthesia Preprocedure Evaluation (Signed)
Anesthesia Evaluation  Patient identified by MRN, date of birth, ID band Patient awake    Reviewed: Allergy & Precautions, H&P , NPO status , Patient's Chart, lab work & pertinent test results, reviewed documented beta blocker date and time   Airway Mallampati: II  TM Distance: >3 FB Neck ROM: full    Dental no notable dental hx.    Pulmonary neg pulmonary ROS, shortness of breath, sleep apnea , former smoker   Pulmonary exam normal breath sounds clear to auscultation       Cardiovascular Exercise Tolerance: Good hypertension, + CAD, + Peripheral Vascular Disease and +CHF  negative cardio ROS + dysrhythmias + Cardiac Defibrillator  Rhythm:regular Rate:Normal     Neuro/Psych CVA negative neurological ROS  negative psych ROS   GI/Hepatic negative GI ROS, Neg liver ROS,,,  Endo/Other  negative endocrine ROSdiabetes    Renal/GU negative Renal ROS  negative genitourinary   Musculoskeletal   Abdominal   Peds  Hematology negative hematology ROS (+) Blood dyscrasia, anemia   Anesthesia Other Findings   Reproductive/Obstetrics negative OB ROS                             Anesthesia Physical Anesthesia Plan  ASA: 3  Anesthesia Plan: General   Post-op Pain Management:    Induction:   PONV Risk Score and Plan: Propofol infusion  Airway Management Planned:   Additional Equipment:   Intra-op Plan:   Post-operative Plan:   Informed Consent: I have reviewed the patients History and Physical, chart, labs and discussed the procedure including the risks, benefits and alternatives for the proposed anesthesia with the patient or authorized representative who has indicated his/her understanding and acceptance.     Dental Advisory Given  Plan Discussed with: CRNA  Anesthesia Plan Comments:        Anesthesia Quick Evaluation

## 2023-04-14 NOTE — Op Note (Signed)
[facility] Patient Name: [name] [name] Procedure Date: 04/14/2023 10:03 AM MRN: [id number] Date of Birth: 02/06/1946 Attending MD: Gennette Pac , MD, [id number] CSN: [id number] Age: 77 Admit Type: Outpatient Procedure:                Colonoscopy Indications:              Heme positive stool, Positive Cologuard test Providers:                Gennette Pac, MD, Crystal Page, [facility]                            Technician, Technician, Buel Ream. Thomasena Edis RN, RN Referring MD:              Medicines:                Propofol per Anesthesia Complications:            No immediate complications. Estimated Blood Loss:     Estimated blood loss was minimal. Procedure:                Pre-Anesthesia Assessment:                           - Prior to the procedure, a History and Physical                            was performed, and patient medications and                            allergies were reviewed. The patient's tolerance of                            previous anesthesia was also reviewed. The risks                            and benefits of the procedure and the sedation                            options and risks were discussed with the patient.                            All questions were answered, and informed consent                            was obtained. Prior Anticoagulants: The patient has                            taken no anticoagulant or antiplatelet agents. ASA                            Grade Assessment: III - A patient with severe                            systemic disease. After reviewing the risks and  benefits, the patient was deemed in satisfactory                            condition to undergo the procedure.                           After obtaining informed consent, the colonoscope                            was passed under direct vision. Throughout the                            procedure, the patient's blood  pressure, pulse, and                            oxygen saturations were monitored continuously. The                            [id number]) scope was introduced through                            the anus and advanced to the the cecum, identified                            by appendiceal orifice and ileocecal valve. The                            colonoscopy was performed without difficulty. The                            patient tolerated the procedure well. The quality                            of the bowel preparation was adequate. The                            ileocecal valve, appendiceal orifice, and rectum                            were photographed. The colonoscopy was performed                            without difficulty. The entire colon was well                            visualized. The patient tolerated the procedure                            well. The quality of the bowel preparation was                            adequate. Scope In: 10:37:35 AM Scope Out: 11:27:02 AM Scope Withdrawal Time: 0 hours 42 minutes 10 seconds  Total Procedure Duration: 0 hours 49 minutes 27 seconds  Findings:      The perianal and digital rectal examinations were normal.      Scattered medium-mouthed diverticula were found in the sigmoid colon and       descending colon.      Three sessile polyps were found in the recto-sigmoid colon, ascending       colon and cecum. The polyps were 4 to 6 mm in size. These polyps were       removed with a cold snare. Resection and retrieval were complete.       Estimated blood loss was minimal.      A 20 x56mm polyp was found in the recto-sigmoid colon. The polyp was       semi-pedunculated. The polyp was lifted away from the colonic wall with       8 cc Ella view; piecemeal hot snare polypectomy ensued. Periphery of the       polypectomy site ablated with tip of the hot snare loop. Resection and       retrieval were complete. Polypectomy defect was  closed with 2 mantis       clips. 0.4 cc of Endo spot demarcated polypectomy site 2 cm distal to       the lesion. During hot snare polypectomy anesthesia inactivated the       defibrillator with magnet per protocol and then reactivated per their       protocol. Impression:               - Diverticulosis in the sigmoid colon and in the                            descending colon.                           - Three 4 to 6 mm polyps at the recto-sigmoid                            colon, in the ascending colon and in the cecum,                            removed with a cold snare. Resected and retrieved.                           - One 20x10 mm polyp at the recto-sigmoid colon,                            removed with a hot snare. Clipped and demarcated                            with ink. . Moderate Sedation:      Moderate (conscious) sedation was personally administered by an       anesthesia professional. The following parameters were monitored: oxygen       saturation, heart rate, blood pressure, respiratory rate, EKG, adequacy       of pulmonary ventilation, and response to care. Recommendation:           - Patient has a contact number available for  emergencies. The signs and symptoms of potential                            delayed complications were discussed with the                            patient. Return to normal activities tomorrow.                            Written discharge instructions were provided to the                            patient.                           - Advance diet as tolerated. Resume Eliquis                            tomorrow. Follow-up on pathology. Further                            recommendations to follow. See EGD report. Procedure Code(s):        --- Professional ---                           [phone], Colonoscopy, flexible; with removal of                            tumor(s), polyp(s), or other lesion(s) by snare                             technique Diagnosis Code(s):        --- Professional ---                           D12.7, Benign neoplasm of rectosigmoid junction                           D12.2, Benign neoplasm of ascending colon                           D12.0, Benign neoplasm of cecum                           R19.5, Other fecal abnormalities                           K57.30, Diverticulosis of large intestine without                            perforation or abscess without bleeding CPT copyright 2022 American Medical Association. All rights reserved. The codes documented in this report are preliminary and upon coder review may  be revised to meet current compliance requirements. Gerrit Friends. Kashis Penley, MD Gennette Pac, MD 04/14/2023 11:48:34 AM This report has been signed electronically. Number of Addenda: 0

## 2023-04-14 NOTE — H&P (Signed)
@LOGO @   Primary Care Physician:  [name], MD Primary Gastroenterologist:  Dr. [name]  Pre-Procedure History & Physical: HPI:  [name] [name] is a 77 y.o. male here for   Further evaluation of positive Cologuard Hemoccult positive stool failure to thrive and weight loss.  Chronically anticoagulated with Eliquis.  Has been held x 2 days.  Here for an EGD and colonoscopy.  Past Medical History:  Diagnosis Date   AICD (automatic cardioverter/defibrillator) present    Arthritis    Knee L - probably   Atrial fibrillation (HCC)    BPH (benign prostatic hyperplasia)    CAD (coronary artery disease) 07/06/2011   CHF (congestive heart failure) (HCC)    Coronary artery disease    Diabetes mellitus    Frequency of urination    Heart failure (HCC)    High cholesterol    History of nuclear stress test 09/2010   dipyridamole; mild perfusion defect due to attenuation with mild superimposed ischemia in apical septal, apical, apical inferior & apical lateral regions; rest LV enlarged in size; prominent gut uptake in infero-apical region; no significant ischemia demonstrated; low risk scan    HNP (herniated nucleus pulposus), lumbar    Hypertension    Ischemic cardiomyopathy    LV dysfunction 07/06/2011   Nocturia    Right bundle branch block    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 Wayland Salinas   Sleep apnea    sleep study 10/2010- AHI during total sleep 32.1/hr and during REM 62.3/hr (severe sleep apnea)unable to tolerate c pap   Spinal stenosis of lumbar region    Stroke [facility])    [place]     Past Surgical History:  Procedure Laterality Date   ABDOMINAL AORTOGRAM W/LOWER EXTREMITY Left 11/14/2020   Procedure: ABDOMINAL AORTOGRAM W/LOWER EXTREMITY;  Surgeon: Cephus Shelling, MD;  Location: [facility] INVASIVE CV LAB;  Service: Cardiovascular;  Laterality: Left;   ABDOMINAL AORTOGRAM W/LOWER EXTREMITY  N/A 01/02/2021   Procedure: ABDOMINAL AORTOGRAM W/LOWER EXTREMITY;  Surgeon: Cephus Shelling, MD;  Location: [facility] INVASIVE CV LAB;  Service: Cardiovascular;  Laterality: N/A;   AMPUTATION Left 03/20/2021   Procedure: Left foot 5th ray amputation;  Surgeon: Toni Arthurs, MD;  Location: [facility] OR;  Service: Orthopedics;  Laterality: Left;   ATRIAL FIBRILLATION ABLATION N/A 01/19/2023   Procedure: ATRIAL FIBRILLATION ABLATION;  Surgeon: [name], MD;  Location: [facility] INVASIVE CV LAB;  Service: Cardiovascular;  Laterality: N/A;   BIV ICD INSERTION CRT-D N/A 04/30/2022   Procedure: BIV ICD INSERTION CRT-D;  Surgeon: [name], MD;  Location: [facility] INVASIVE CV LAB;  Service: Cardiovascular;  Laterality: N/A;   CARDIAC CATHETERIZATION  01/2011   ischemic cardiomyopathy 30-35%, multivessel CAD (Dr. Bishop Limbo)    CARDIAC CATHETERIZATION  09/13/2015   Procedure: Right/Left Heart Cath and Coronary/Graft Angiography;  Surgeon: [name], MD;  Location: [facility] INVASIVE CV LAB;  Service: Cardiovascular;;   CARDIOVERSION N/A 05/16/2019   Procedure: CARDIOVERSION;  Surgeon: [name], MD;  Location: [facility] ENDOSCOPY;  Service: Cardiovascular;  Laterality: N/A;   CARDIOVERSION N/A 10/16/2022   Procedure: CARDIOVERSION;  Surgeon: Thurmon Fair, MD;  Location: [facility] INVASIVE CV LAB;  Service: Cardiovascular;  Laterality: N/A;   CARDIOVERSION N/A 12/16/2022   Procedure: CARDIOVERSION;  Surgeon: Little Ishikawa, MD;  Location: [facility] INVASIVE CV LAB;  Service: Cardiovascular;  Laterality: N/A;   Colonscopy  CORONARY ARTERY BYPASS GRAFT  08/03/2011   Procedure: CORONARY ARTERY BYPASS GRAFTING (CABG);  Surgeon: Alleen Borne, MD;  Location[facility] OR;  Service: Open Heart Surgery;  Laterality: N/A;  CABG times three using right saphenous vein and left mammary artery usisng endoscope.   LEFT HEART CATH AND CORS/GRAFTS ANGIOGRAPHY N/A 12/15/2021   Procedure: LEFT HEART CATH AND CORS/GRAFTS ANGIOGRAPHY;   Surgeon: Tonny Bollman, MD;  Location: [facility] INVASIVE CV LAB;  Service: Cardiovascular;  Laterality: N/A;   LEFT HEART CATHETERIZATION WITH CORONARY/GRAFT ANGIOGRAM N/A 09/20/2013   Procedure: LEFT HEART CATHETERIZATION WITH Isabel Caprice;  Surgeon: [name], MD;  Location: [facility] CATH LAB;  Service: Cardiovascular;  Laterality: N/A;   Lower Extremity Arterial Doppler  03/16/2013   bilat ABIs demonstated normal values; R runoff - posterior tibial & anterior tibial arteries occluded; L runoff - peroneal & posterior tibial arteries occluded, anterior tibial artery appears occluded   LUMBAR LAMINECTOMY/DECOMPRESSION MICRODISCECTOMY N/A 09/12/2014   Procedure: LUMBAR DECOMPRESSION L3-L4, L4-L5, MICRODISCECTOMY L4-L5;  Surgeon: Jene Every, MD;  Location: [facility] ORS;  Service: Orthopedics;  Laterality: N/A;   PERIPHERAL VASCULAR BALLOON ANGIOPLASTY  11/14/2020   Procedure: PERIPHERAL VASCULAR BALLOON ANGIOPLASTY;  Surgeon: Cephus Shelling, MD;  Location: [facility] INVASIVE CV LAB;  Service: Cardiovascular;;   PERIPHERAL VASCULAR BALLOON ANGIOPLASTY Left 01/02/2021   Procedure: PERIPHERAL VASCULAR BALLOON ANGIOPLASTY;  Surgeon: Cephus Shelling, MD;  Location: [facility] INVASIVE CV LAB;  Service: Cardiovascular;  Laterality: Left;  Failed PTA   Pilonidal Cyst removed     TRANSTHORACIC ECHOCARDIOGRAM  11/10/2012   EF 40-45%, mild LVH, mild hypokinesis of anteroseptal myocardium, grade 1 diastolic dysfunction; mild MR & calcifed mitral annulus; LA mild-mod dilated; RA mildly dilated    Prior to Admission medications   Medication Sig Start Date End Date Taking? Authorizing Provider  amiodarone (PACERONE) 200 MG tablet Take 2 tablets (400 mg total) TWICE a day for 2 weeks, then take 1 tablet (200 mg total) TWICE a day for 2 weeks, then take 1 tablet (200 mg total) ONCE a day thereafter Patient taking differently: Take 100 mg by mouth daily. Take 0.5 tablets (100 mg total) once daily. 10/27/22  Yes Camnitz,  Will Daphine Deutscher, MD  carvedilol (COREG) 3.125 MG tablet Take 1 tablet (3.125 mg total) by mouth 2 (two) times daily with a meal. 08/20/22  Yes [name], Lisette Abu, MD  furosemide (LASIX) 40 MG tablet Take 1 1/2 tablet by mouth in the morning and 1 tablet by mouth in the afternoon for 4 days then go back down to 1 tablet by mouth twice a day Patient taking differently: Take 40 mg by mouth daily. Take 1  tablet by mouth in the morning and 1 tablet by mouth in the afternoon as needed. 10/22/22  Yes Duke, Roe Rutherford, PA  gabapentin (NEURONTIN) 300 MG capsule Take 600 mg by mouth at bedtime.   Yes [provider]  metFORMIN (GLUCOPHAGE-XR) 500 MG 24 hr tablet Take 1,000 mg by mouth 2 (two) times daily. 07/05/15  Yes [provider]  rosuvastatin (CRESTOR) 10 MG tablet Take 1 tablet (10 mg total) by mouth every other day. 10/22/22  Yes Duke, Roe Rutherford, PA  acetaminophen (TYLENOL) 500 MG tablet Take 500-1,000 mg by mouth every 6 (six) hours as needed (for pain.).    [provider]  apixaban (ELIQUIS) 5 MG TABS tablet Take 5 mg by mouth 2 (two) times daily.    [provider]  empagliflozin (JARDIANCE) 25 MG TABS  tablet Take 1 tablet (25 mg total) by mouth daily. 08/20/22   [name], MD  Na Sulfate-K Sulfate-Mg Sulf 17.5-3.13-1.6 GM/177ML SOLN As directed 03/15/23   Juwann Sherk, Gerrit Friends, MD  [facility] VERIO test strip 1 each daily. 08/16/19   [provider]    Allergies as of 03/15/2023 - Review Complete 03/03/2023  Allergen Reaction Noted   Entresto [sacubitril-valsartan] Other (See Comments) 02/25/2017   Sacubitril Other (See Comments) 10/05/2017    Family History  Problem Relation Age of Onset   Cancer Mother    Lung disease Father        & heart disease   Colon polyps Father    Colon cancer Sister    Sudden death Maternal Grandmother    Anesthesia problems Daughter     Social History   Socioeconomic History   Marital status: Widowed    Spouse  name: Not on file   Number of children: 1   Years of education: Not on file   Highest education level: Not on file  Occupational History   Occupation: real Psychologist, occupational  Tobacco Use   Smoking status: Former    Types: Cigars    Quit date: 09/07/2011    Years since quitting: 11.6   Smokeless tobacco: Former    Quit date: 02/28/2012   Tobacco comments:    Former smoker 02/16/23  Vaping Use   Vaping status: Never Used  Substance and Sexual Activity   Alcohol use: Not Currently    Alcohol/week: 1.0 - 2.0 standard drink of alcohol    Types: 1 - 2 Cans of beer per week   Drug use: No   Sexual activity: Not on file  Other Topics Concern   Not on file  Social History Narrative   ** Merged History Encounter **       Social Determinants of Health   Financial Resource Strain: Not on file  Food Insecurity: No Food Insecurity (11/09/2022)   Hunger Vital Sign    Worried About Running Out of Food in the Last Year: Never true    Ran Out of Food in the Last Year: Never true  Transportation Needs: No Transportation Needs (11/09/2022)   PRAPARE - Administrator, Civil Service (Medical): No    Lack of Transportation (Non-Medical): No  Physical Activity: Low Risk  (08/11/2022)   [facility]   PCARE Exercise SDOH    Exercise: Active Lifestyle Only    PCare Exercise SDOH: Not on file    PCare Exercise SDOH: Not on file  Stress: Not on file  Social Connections: Not on file  Intimate Partner Violence: Not At Risk (11/09/2022)   Humiliation, Afraid, Rape, and Kick questionnaire    Fear of Current or Ex-Partner: No    Emotionally Abused: No    Physically Abused: No    Sexually Abused: No    Review of Systems: See HPI, otherwise negative ROS  Physical Exam: BP 124/67   Pulse 61   Temp 97.8 F (36.6 C) (Oral)   Resp 16   Ht 6\' 2"  (1.88 m)   Wt 93.4 kg   SpO2 100%   BMI 26.44 kg/m  General:   Alert,  Well-developed,  well-nourished, pleasant and cooperative in NAD  Heart:  Regular rate and rhythm; no murmurs, clicks, rubs,  or gallops. Abdomen: Non-distended, normal bowel sounds.  Soft and nontender without appreciable mass or hepatosplenomegaly.  Pulses:  Normal pulses noted.  Extremities:  Without clubbing or edema.  Impression/Plan:    77 year old gentleman with failure to thrive 20 pound weight loss anemia positive Cologuard and Hemoccult positive stool.  Denies dysphagia.  Diagnostic EGD and colonoscopy today per plan.  Discussed at length with daughter.  Eliquis held 3 days ago.  Patient has a defibrillator which is active. The risks, benefits, limitations, alternatives and imponderables have been reviewed with the patient. Questions have been answered. All parties are agreeable.       Notice: This dictation was prepared with Dragon dictation along with smaller phrase technology. Any transcriptional errors that result from this process are unintentional and may not be corrected upon review.

## 2023-04-14 NOTE — Discharge Instructions (Addendum)
EGD Discharge instructions Please read the instructions outlined below and refer to this sheet in the next few weeks. These discharge instructions provide you with general information on caring for yourself after you leave the hospital. Your doctor may also give you specific instructions. While your treatment has been planned according to the most current medical practices available, unavoidable complications occasionally occur. If you have any problems or questions after discharge, please call your doctor. ACTIVITY You may resume your regular activity but move at a slower pace for the next 24 hours.  Take frequent rest periods for the next 24 hours.  Walking will help expel (get rid of) the air and reduce the bloated feeling in your abdomen.  No driving for 24 hours (because of the anesthesia (medicine) used during the test).  You may shower.  Do not sign any important legal documents or operate any machinery for 24 hours (because of the anesthesia used during the test).  NUTRITION Drink plenty of fluids.  You may resume your normal diet.  Begin with a light meal and progress to your normal diet.  Avoid alcoholic beverages for 24 hours or as instructed by your caregiver.  MEDICATIONS You may resume your normal medications unless your caregiver tells you otherwise.  WHAT YOU CAN EXPECT TODAY You may experience abdominal discomfort such as a feeling of fullness or "gas" pains.  FOLLOW-UP Your doctor will discuss the results of your test with you.  SEEK IMMEDIATE MEDICAL ATTENTION IF ANY OF THE FOLLOWING OCCUR: Excessive nausea (feeling sick to your stomach) and/or vomiting.  Severe abdominal pain and distention (swelling).  Trouble swallowing.  Temperature over 101 F (37.8 C).  Rectal bleeding or vomiting of blood.   Colonoscopy Discharge Instructions  Read the instructions outlined below and refer to this sheet in the next few weeks. These discharge instructions provide you with  general information on caring for yourself after you leave the hospital. Your doctor may also give you specific instructions. While your treatment has been planned according to the most current medical practices available, unavoidable complications occasionally occur. If you have any problems or questions after discharge, call Dr. [name] at [phone]. ACTIVITY You may resume your regular activity, but move at a slower pace for the next 24 hours.  Take frequent rest periods for the next 24 hours.  Walking will help get rid of the air and reduce the bloated feeling in your belly (abdomen).  No driving for 24 hours (because of the medicine (anesthesia) used during the test).   Do not sign any important legal documents or operate any machinery for 24 hours (because of the anesthesia used during the test).  NUTRITION Drink plenty of fluids.  You may resume your normal diet as instructed by your doctor.  Begin with a light meal and progress to your normal diet. Heavy or fried foods are harder to digest and may make you feel sick to your stomach (nauseated).  Avoid alcoholic beverages for 24 hours or as instructed.  MEDICATIONS You may resume your normal medications unless your doctor tells you otherwise.  WHAT YOU CAN EXPECT TODAY Some feelings of bloating in the abdomen.  Passage of more gas than usual.  Spotting of blood in your stool or on the toilet paper.  IF YOU HAD POLYPS REMOVED DURING THE COLONOSCOPY: No aspirin products for 7 days or as instructed.  No alcohol for 7 days or as instructed.  Eat a soft diet for the next 24 hours.  FINDING  OUT THE RESULTS OF YOUR TEST Not all test results are available during your visit. If your test results are not back during the visit, make an appointment with your caregiver to find out the results. Do not assume everything is normal if you have not heard from your caregiver or the medical facility. It is important for you to follow up on all of your test  results.  SEEK IMMEDIATE MEDICAL ATTENTION IF: You have more than a spotting of blood in your stool.  Your belly is swollen (abdominal distention).  You are nauseated or vomiting.  You have a temperature over 101.  You have abdominal pain or discomfort that is severe or gets worse throughout the day.      You have  an abnormal esophagus  and stomach. Samples were taken.    Multiple polyps removed from your colon   no future MRI until clips gone   further recommendations to follow pending review of pathology report  Resume Eliquis tomorrow   at patient request, I called   Regan Rakers.    [phone]-reviewed findings with family

## 2023-04-14 NOTE — Telephone Encounter (Signed)
Patient called to advise he may use the battery powered heating vest. Appreciative of call.

## 2023-04-14 NOTE — Op Note (Signed)
[facility] Patient Name: [name] [name] Procedure Date: 04/14/2023 10:04 AM MRN: [id number] Date of Birth: 1946/05/07 Attending MD: Gennette Pac , MD, [id number] CSN: [id number] Age: 77 Admit Type: Outpatient Procedure:                Upper GI endoscopy Indications:              Failure to thrive, Weight loss Providers:                Gennette Pac, MD, Crystal Page, Lennice Sites                            Technician, Technician, Buel Ream. Thomasena Edis RN, RN Referring MD:              Medicines:                Propofol per Anesthesia Complications:            No immediate complications. Estimated Blood Loss:     Estimated blood loss was minimal. Procedure:                Pre-Anesthesia Assessment:                           - Prior to the procedure, a History and Physical                            was performed, and patient medications and                            allergies were reviewed. The patient's tolerance of                            previous anesthesia was also reviewed. The risks                            and benefits of the procedure and the sedation                            options and risks were discussed with the patient.                            All questions were answered, and informed consent                            was obtained. Prior Anticoagulants: The patient                            last took Eliquis (apixaban) 3 days prior to the                            procedure. ASA Grade Assessment: III - A patient                            with severe systemic disease. After reviewing the  risks and benefits, the patient was deemed in                            satisfactory condition to undergo the procedure.                           After obtaining informed consent, the endoscope was                            passed under direct vision. Throughout the                            procedure, the patient's blood  pressure, pulse, and                            oxygen saturations were monitored continuously. The                            [id number]-[id number] ([id number]) scope was introduced through the                            mouth, and advanced to the second part of duodenum.                            The upper GI endoscopy was accomplished without                            difficulty. The patient tolerated the procedure                            well. Scope In: 10:26:12 AM Scope Out: 10:31:53 AM Total Procedure Duration: 0 hours 5 minutes 41 seconds  Findings:      Yellow, cream-colored plaques lining the entire esophagus. Gastric       cavity empty. Mild snake skinning of the gastric mucosa diffusely. No       ulcer or infiltrative process. Pylorus patent.      The duodenal bulb and second portion of the duodenum were normal.       Gastric mucosal biopsies taken. 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                           -Abnormal gastric mucosa of uncertain                            significance?"status post biopsy                           - Normal duodenal bulb and second portion of the  duodenum. Moderate Sedation:      Moderate (conscious) sedation was personally administered by an       anesthesia professional. The following parameters were monitored: oxygen       saturation, heart rate, blood pressure, respiratory rate, EKG, adequacy       of pulmonary ventilation, and response to care. Recommendation:           - Patient has a contact number available for                            emergencies. The signs and symptoms of potential                            delayed complications were discussed with the                            patient. Return to normal activities tomorrow.                            Written discharge instructions  were provided to the                            patient.                           - Advance diet as tolerated. Follow-up on lab                            studies. See colonoscopy report. Procedure Code(s):        --- Professional ---                           [phone], Esophagogastroduodenoscopy, flexible,                            transoral; diagnostic, including collection of                            specimen(s) by brushing or washing, when performed                            (separate procedure) Diagnosis Code(s):        --- Professional ---                           R62.7, Adult failure to thrive                           R63.4, Abnormal weight loss CPT copyright 2022 [employer]. All rights reserved. The codes documented in this report are preliminary and upon coder review may  be revised to meet current compliance requirements. Gerrit Friends. Tykira Wachs, MD Gennette Pac, MD 04/14/2023 11:32:19 AM This report has been signed electronically. Number of Addenda: 0

## 2023-04-15 ENCOUNTER — Encounter (HOSPITAL_COMMUNITY): Payer: Self-pay

## 2023-04-15 ENCOUNTER — Encounter (HOSPITAL_COMMUNITY)
Admission: RE | Admit: 2023-04-15 | Discharge: 2023-04-15 | Disposition: A | Discharge status: Home / Self Care | Payer: Medicare Other | Source: Ambulatory Visit | Attending: Internal Medicine | Admitting: Internal Medicine

## 2023-04-15 LAB — GLUCOSE, CAPILLARY: Glucose-Capillary: 357 mg/dL — ABNORMAL HIGH (ref 70–99)

## 2023-04-15 NOTE — Anesthesia Postprocedure Evaluation (Signed)
Anesthesia Post Note  Patient: [name] [name]  Procedure(s) Performed: COLONOSCOPY WITH PROPOFOL ESOPHAGOGASTRODUODENOSCOPY (EGD) WITH PROPOFOL BIOPSY ESOPHAGEAL BRUSHING POLYPECTOMY INTESTINAL SUBMUCOSAL TATTOO INJECTION SCLEROTHERAPY HEMOSTASIS CLIP PLACEMENT SUBMUCOSAL LIFTING INJECTION  Patient location during evaluation: Phase II Anesthesia Type: General Level of consciousness: awake Pain management: pain level controlled Vital Signs Assessment: post-procedure vital signs reviewed and stable Respiratory status: spontaneous breathing and respiratory function stable Cardiovascular status: blood pressure returned to baseline and stable Postop Assessment: no headache and no apparent nausea or vomiting Anesthetic complications: no Comments: Late entry   No notable events documented.   Last Vitals:  Vitals:   04/14/23 0905 04/14/23 1132  BP: 124/67 126/63  Pulse: 61 61  Resp: 16 18  Temp: 36.6 C 36.6 C  SpO2: 100% 100%    Last Pain:  Vitals:   04/14/23 1132  TempSrc: Oral  PainSc: 0-No pain                 Windell Norfolk

## 2023-04-16 ENCOUNTER — Encounter (INDEPENDENT_AMBULATORY_CARE_PROVIDER_SITE_OTHER): Payer: Self-pay

## 2023-04-17 ENCOUNTER — Other Ambulatory Visit: Payer: Self-pay

## 2023-04-17 ENCOUNTER — Telehealth: Payer: Self-pay | Admitting: Internal Medicine

## 2023-04-17 ENCOUNTER — Inpatient Hospital Stay (HOSPITAL_COMMUNITY)
Admission: EM | Admit: 2023-04-17 | Discharge: 2023-04-21 | DRG: 393 | Disposition: A | Discharge status: Home Health | Payer: Medicare Other | Attending: Family Medicine | Admitting: Family Medicine

## 2023-04-17 ENCOUNTER — Encounter (HOSPITAL_COMMUNITY): Payer: Self-pay

## 2023-04-17 DIAGNOSIS — K633 Ulcer of intestine: Principal | ICD-10-CM | POA: Diagnosis present

## 2023-04-17 DIAGNOSIS — K8020 Calculus of gallbladder without cholecystitis without obstruction: Secondary | ICD-10-CM | POA: Diagnosis not present

## 2023-04-17 DIAGNOSIS — I110 Hypertensive heart disease with heart failure: Secondary | ICD-10-CM | POA: Diagnosis present

## 2023-04-17 DIAGNOSIS — G4730 Sleep apnea, unspecified: Secondary | ICD-10-CM | POA: Diagnosis present

## 2023-04-17 DIAGNOSIS — Z83719 Family history of colon polyps, unspecified: Secondary | ICD-10-CM

## 2023-04-17 DIAGNOSIS — Z79899 Other long term (current) drug therapy: Secondary | ICD-10-CM

## 2023-04-17 DIAGNOSIS — D125 Benign neoplasm of sigmoid colon: Secondary | ICD-10-CM | POA: Diagnosis present

## 2023-04-17 DIAGNOSIS — D122 Benign neoplasm of ascending colon: Secondary | ICD-10-CM | POA: Diagnosis present

## 2023-04-17 DIAGNOSIS — I255 Ischemic cardiomyopathy: Secondary | ICD-10-CM | POA: Diagnosis present

## 2023-04-17 DIAGNOSIS — D120 Benign neoplasm of cecum: Secondary | ICD-10-CM | POA: Diagnosis present

## 2023-04-17 DIAGNOSIS — R627 Adult failure to thrive: Secondary | ICD-10-CM | POA: Diagnosis present

## 2023-04-17 DIAGNOSIS — E7800 Pure hypercholesterolemia, unspecified: Secondary | ICD-10-CM | POA: Diagnosis present

## 2023-04-17 DIAGNOSIS — E663 Overweight: Secondary | ICD-10-CM | POA: Diagnosis present

## 2023-04-17 DIAGNOSIS — K922 Gastrointestinal hemorrhage, unspecified: Secondary | ICD-10-CM | POA: Diagnosis present

## 2023-04-17 DIAGNOSIS — J9811 Atelectasis: Secondary | ICD-10-CM | POA: Diagnosis not present

## 2023-04-17 DIAGNOSIS — Z8673 Personal history of transient ischemic attack (TIA), and cerebral infarction without residual deficits: Secondary | ICD-10-CM

## 2023-04-17 DIAGNOSIS — I5043 Acute on chronic combined systolic (congestive) and diastolic (congestive) heart failure: Secondary | ICD-10-CM | POA: Diagnosis present

## 2023-04-17 DIAGNOSIS — I5042 Chronic combined systolic (congestive) and diastolic (congestive) heart failure: Secondary | ICD-10-CM | POA: Diagnosis not present

## 2023-04-17 DIAGNOSIS — R634 Abnormal weight loss: Secondary | ICD-10-CM | POA: Diagnosis present

## 2023-04-17 DIAGNOSIS — R188 Other ascites: Secondary | ICD-10-CM | POA: Diagnosis not present

## 2023-04-17 DIAGNOSIS — D696 Thrombocytopenia, unspecified: Secondary | ICD-10-CM | POA: Diagnosis present

## 2023-04-17 DIAGNOSIS — Z7901 Long term (current) use of anticoagulants: Secondary | ICD-10-CM

## 2023-04-17 DIAGNOSIS — Y838 Other surgical procedures as the cause of abnormal reaction of the patient, or of later complication, without mention of misadventure at the time of the procedure: Secondary | ICD-10-CM | POA: Diagnosis not present

## 2023-04-17 DIAGNOSIS — I951 Orthostatic hypotension: Secondary | ICD-10-CM | POA: Diagnosis not present

## 2023-04-17 DIAGNOSIS — Z951 Presence of aortocoronary bypass graft: Secondary | ICD-10-CM

## 2023-04-17 DIAGNOSIS — Z888 Allergy status to other drugs, medicaments and biological substances status: Secondary | ICD-10-CM

## 2023-04-17 DIAGNOSIS — E1165 Type 2 diabetes mellitus with hyperglycemia: Secondary | ICD-10-CM | POA: Diagnosis present

## 2023-04-17 DIAGNOSIS — Z7984 Long term (current) use of oral hypoglycemic drugs: Secondary | ICD-10-CM

## 2023-04-17 DIAGNOSIS — C189 Malignant neoplasm of colon, unspecified: Secondary | ICD-10-CM | POA: Diagnosis present

## 2023-04-17 DIAGNOSIS — Z800 Family history of malignant neoplasm of digestive organs: Secondary | ICD-10-CM

## 2023-04-17 DIAGNOSIS — I10 Essential (primary) hypertension: Secondary | ICD-10-CM | POA: Diagnosis not present

## 2023-04-17 DIAGNOSIS — K91840 Postprocedural hemorrhage and hematoma of a digestive system organ or structure following a digestive system procedure: Secondary | ICD-10-CM | POA: Diagnosis not present

## 2023-04-17 DIAGNOSIS — E1169 Type 2 diabetes mellitus with other specified complication: Secondary | ICD-10-CM | POA: Diagnosis not present

## 2023-04-17 DIAGNOSIS — K625 Hemorrhage of anus and rectum: Principal | ICD-10-CM | POA: Diagnosis present

## 2023-04-17 DIAGNOSIS — K5730 Diverticulosis of large intestine without perforation or abscess without bleeding: Secondary | ICD-10-CM | POA: Diagnosis present

## 2023-04-17 DIAGNOSIS — Z6826 Body mass index (BMI) 26.0-26.9, adult: Secondary | ICD-10-CM

## 2023-04-17 DIAGNOSIS — T185XXA Foreign body in anus and rectum, initial encounter: Secondary | ICD-10-CM | POA: Diagnosis not present

## 2023-04-17 DIAGNOSIS — Z89422 Acquired absence of other left toe(s): Secondary | ICD-10-CM

## 2023-04-17 DIAGNOSIS — J90 Pleural effusion, not elsewhere classified: Secondary | ICD-10-CM | POA: Diagnosis not present

## 2023-04-17 DIAGNOSIS — I2510 Atherosclerotic heart disease of native coronary artery without angina pectoris: Secondary | ICD-10-CM | POA: Diagnosis present

## 2023-04-17 DIAGNOSIS — M1712 Unilateral primary osteoarthritis, left knee: Secondary | ICD-10-CM | POA: Diagnosis present

## 2023-04-17 DIAGNOSIS — D123 Benign neoplasm of transverse colon: Secondary | ICD-10-CM | POA: Diagnosis not present

## 2023-04-17 DIAGNOSIS — Z95810 Presence of automatic (implantable) cardiac defibrillator: Secondary | ICD-10-CM

## 2023-04-17 DIAGNOSIS — N400 Enlarged prostate without lower urinary tract symptoms: Secondary | ICD-10-CM | POA: Diagnosis present

## 2023-04-17 DIAGNOSIS — K5900 Constipation, unspecified: Secondary | ICD-10-CM | POA: Diagnosis not present

## 2023-04-17 DIAGNOSIS — Z87891 Personal history of nicotine dependence: Secondary | ICD-10-CM

## 2023-04-17 DIAGNOSIS — R001 Bradycardia, unspecified: Secondary | ICD-10-CM | POA: Diagnosis not present

## 2023-04-17 LAB — URINALYSIS, ROUTINE W REFLEX MICROSCOPIC
Bacteria, UA: NONE SEEN
Bilirubin Urine: NEGATIVE
Glucose, UA: >=500 mg/dL — AB
Ketones, ur: NEGATIVE mg/dL
Leukocytes,Ua: NEGATIVE
Nitrite: NEGATIVE
Protein, ur: NEGATIVE mg/dL
Specific Gravity, Urine: 1.03 (ref 1.005–1.030)
pH: 6 (ref 5.0–8.0)

## 2023-04-17 LAB — COMPREHENSIVE METABOLIC PANEL
ALT: 22 U/L (ref 0–44)
AST: 14 U/L — ABNORMAL LOW (ref 15–41)
Albumin: 3.4 g/dL — ABNORMAL LOW (ref 3.5–5.0)
Alkaline Phosphatase: 100 U/L (ref 38–126)
Anion gap: 8 (ref 5–15)
BUN: 20 mg/dL (ref 8–23)
CO2: 26 mmol/L (ref 22–32)
Calcium: 8.4 mg/dL — ABNORMAL LOW (ref 8.9–10.3)
Chloride: 102 mmol/L (ref 98–111)
Creatinine, Ser: 0.92 mg/dL (ref 0.61–1.24)
GFR, Estimated: >60 mL/min (ref >60)
Glucose, Bld: 301 mg/dL — ABNORMAL HIGH (ref 70–99)
Potassium: 3.7 mmol/L (ref 3.5–5.1)
Sodium: 136 mmol/L (ref 135–145)
Total Bilirubin: 1.1 mg/dL (ref 0.3–1.2)
Total Protein: 6.4 g/dL — ABNORMAL LOW (ref 6.5–8.1)

## 2023-04-17 LAB — GLUCOSE, CAPILLARY: Glucose-Capillary: 195 mg/dL — ABNORMAL HIGH (ref 70–99)

## 2023-04-17 LAB — CBC
HCT: 32.3 % — ABNORMAL LOW (ref 39.0–52.0)
Hemoglobin: 10.6 g/dL — ABNORMAL LOW (ref 13.0–17.0)
MCH: 28.5 pg (ref 26.0–34.0)
MCHC: 32.8 g/dL (ref 30.0–36.0)
MCV: 86.8 fL (ref 80.0–100.0)
Platelets: 186 10*3/uL (ref 150–400)
RBC: 3.72 MIL/uL — ABNORMAL LOW (ref 4.22–5.81)
RDW: 15.3 % (ref 11.5–15.5)
WBC: 7.8 10*3/uL (ref 4.0–10.5)
nRBC: 0 % (ref 0.0–0.2)

## 2023-04-17 LAB — CBG MONITORING, ED: Glucose-Capillary: 252 mg/dL — ABNORMAL HIGH (ref 70–99)

## 2023-04-17 LAB — HEMOGLOBIN AND HEMATOCRIT, BLOOD
HCT: 29.1 % — ABNORMAL LOW (ref 39.0–52.0)
Hemoglobin: 9.3 g/dL — ABNORMAL LOW (ref 13.0–17.0)

## 2023-04-17 LAB — PREPARE RBC (CROSSMATCH)

## 2023-04-17 LAB — POC OCCULT BLOOD, ED: Fecal Occult Blood: POSITIVE — AB

## 2023-04-17 MED ORDER — EMPAGLIFLOZIN 25 MG PO TABS
25.0000 mg | ORAL_TABLET | Freq: Every day | ORAL | Status: DC
  Administered 2023-04-19 – 2023-04-21 (×3): 25 mg via ORAL
  Filled 2023-04-17 (×6): qty 1

## 2023-04-17 MED ORDER — ACETAMINOPHEN 650 MG RE SUPP: 650.0000 mg | Freq: Four times a day (QID) | RECTAL | Status: DC | PRN

## 2023-04-17 MED ORDER — AMIODARONE HCL 200 MG PO TABS
100.0000 mg | ORAL_TABLET | Freq: Every day | ORAL | Status: DC
  Administered 2023-04-17 – 2023-04-20 (×4): 100 mg via ORAL
  Filled 2023-04-17 (×4): qty 1

## 2023-04-17 MED ORDER — SODIUM CHLORIDE 0.9 % IV SOLN: INTRAVENOUS | Status: AC

## 2023-04-17 MED ORDER — ONDANSETRON HCL 4 MG/2ML IJ SOLN: 4.0000 mg | Freq: Four times a day (QID) | INTRAMUSCULAR | Status: DC | PRN

## 2023-04-17 MED ORDER — SODIUM CHLORIDE 0.9% IV SOLUTION: Freq: Once | INTRAVENOUS | Status: DC

## 2023-04-17 MED ORDER — ONDANSETRON HCL 4 MG PO TABS: 4.0000 mg | ORAL_TABLET | Freq: Four times a day (QID) | ORAL | Status: DC | PRN

## 2023-04-17 MED ORDER — PEG 3350-KCL-NA BICARB-NACL 420 G PO SOLR
4000.0000 mL | Freq: Once | ORAL | Status: AC
  Administered 2023-04-17: 4000 mL via ORAL

## 2023-04-17 MED ORDER — SODIUM CHLORIDE 0.9 % IV BOLUS
1000.0000 mL | Freq: Once | INTRAVENOUS | Status: AC
  Administered 2023-04-17: 1000 mL via INTRAVENOUS

## 2023-04-17 MED ORDER — INSULIN ASPART 100 UNIT/ML IJ SOLN
0.0000 [IU] | INTRAMUSCULAR | Status: DC
  Administered 2023-04-18 (×3): 3 [IU] via SUBCUTANEOUS
  Administered 2023-04-18: 8 [IU] via SUBCUTANEOUS

## 2023-04-17 MED ORDER — METFORMIN HCL ER 500 MG PO TB24
1000.0000 mg | ORAL_TABLET | Freq: Two times a day (BID) | ORAL | Status: DC
  Administered 2023-04-19 – 2023-04-21 (×5): 1000 mg via ORAL
  Filled 2023-04-17 (×9): qty 2

## 2023-04-17 MED ORDER — SENNOSIDES-DOCUSATE SODIUM 8.6-50 MG PO TABS: 1.0000 | ORAL_TABLET | Freq: Every evening | ORAL | Status: DC | PRN

## 2023-04-17 MED ORDER — GABAPENTIN 300 MG PO CAPS
600.0000 mg | ORAL_CAPSULE | Freq: Every day | ORAL | Status: DC
  Administered 2023-04-17 – 2023-04-20 (×4): 600 mg via ORAL
  Filled 2023-04-17 (×4): qty 2

## 2023-04-17 MED ORDER — ACETAMINOPHEN 325 MG PO TABS: 650.0000 mg | ORAL_TABLET | Freq: Four times a day (QID) | ORAL | Status: DC | PRN

## 2023-04-17 MED ORDER — AMIODARONE HCL 200 MG PO TABS: 100.0000 mg | ORAL_TABLET | Freq: Every day | ORAL | Status: DC

## 2023-04-17 MED ORDER — PEG 3350-KCL-NA BICARB-NACL 420 G PO SOLR: 4000.0000 mL | Freq: Once | ORAL | Status: DC

## 2023-04-17 NOTE — H&P (Signed)
History and Physical    Patient: [name] [name] QIO:[id number] DOB: 02-18-1946 DOA: 04/17/2023 DOS: the patient was seen and examined on 04/17/2023 PCP: [name], MD  Patient coming from: Home  Chief Complaint:  Chief Complaint  Patient presents with   Rectal Bleeding   HPI: [name] [name] is a 77 y.o. male with medical history significant of A-fib on Eliquis, CAD, T2DM, HTN and CHF who presents to the ED for evaluation of rectal bleeding.  Patient reports that he underwent colonoscopy 3 days ago was found to have multiple scattered diverticula as well as 3 polyps that were clipped with cold snare.  He was having normal soft bowel movements until yesterday when he noticed bright red blood from his rectum during bowel movements.  Last night, during a bowel movement, he held a metal clip hit the toilet but did not see any metal when he looked.  He continued to have bloody bowel movement throughout the night and presented this morning for further evaluation.  He did not take his Eliquis last night or this morning. During these bleeding episodes, he has had associated weakness, abdominal discomfort, mild headache and mild lightheadedness.  He denies any chest pain, hematuria, shortness of breath or leg swelling.  ED course: Initially mild hypotension with SBP in the 80s to 100s.  Positive occult blood test. CBC shows hemoglobin 10.6, slightly down from 11.6 one week ago. Platelet 186.  UA shows glucosuria. GI consulted for evaluation and has plan for repeat colonoscopy in the morning. Hospitalist consulted for admission for further management of GI bleed.  Review of Systems: As mentioned in the history of present illness. All other systems reviewed and are negative. Past Medical History:  Diagnosis Date   AICD (automatic cardioverter/defibrillator) present    Arthritis    Knee L - probably   Atrial fibrillation (HCC)    BPH (benign prostatic hyperplasia)    CAD (coronary artery disease)  07/06/2011   CHF (congestive heart failure) (HCC)    Coronary artery disease    Diabetes mellitus    Frequency of urination    Heart failure (HCC)    High cholesterol    History of nuclear stress test 09/2010   dipyridamole; mild perfusion defect due to attenuation with mild superimposed ischemia in apical septal, apical, apical inferior & apical lateral regions; rest LV enlarged in size; prominent gut uptake in infero-apical region; no significant ischemia demonstrated; low risk scan    HNP (herniated nucleus pulposus), lumbar    Hypertension    Ischemic cardiomyopathy    LV dysfunction 07/06/2011   Nocturia    Right bundle branch block    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 Wayland Salinas   Sleep apnea    sleep study 10/2010- AHI during total sleep 32.1/hr and during REM 62.3/hr (severe sleep apnea)unable to tolerate c pap   Spinal stenosis of lumbar region    Stroke [facility])    [place]    Past Surgical History:  Procedure Laterality Date   ABDOMINAL AORTOGRAM W/LOWER EXTREMITY Left 11/14/2020   Procedure: ABDOMINAL AORTOGRAM W/LOWER EXTREMITY;  Surgeon: Cephus Shelling, MD;  Location[facility] INVASIVE CV LAB;  Service: Cardiovascular;  Laterality: Left;   ABDOMINAL AORTOGRAM W/LOWER EXTREMITY N/A 01/02/2021   Procedure: ABDOMINAL AORTOGRAM W/LOWER EXTREMITY;  Surgeon: Cephus Shelling, MD;  Location: [facility] INVASIVE CV LAB;  Service: Cardiovascular;  Laterality: N/A;  AMPUTATION Left 03/20/2021   Procedure: Left foot 5th ray amputation;  Surgeon: Toni Arthurs, MD;  Location: [facility] OR;  Service: Orthopedics;  Laterality: Left;   ATRIAL FIBRILLATION ABLATION N/A 01/19/2023   Procedure: ATRIAL FIBRILLATION ABLATION;  Surgeon: [name], MD;  Location: [facility] INVASIVE CV LAB;  Service: Cardiovascular;  Laterality: N/A;   BIV ICD INSERTION CRT-D N/A 04/30/2022   Procedure: BIV ICD INSERTION CRT-D;   Surgeon: [name], MD;  Location: [facility] INVASIVE CV LAB;  Service: Cardiovascular;  Laterality: N/A;   CARDIAC CATHETERIZATION  01/2011   ischemic cardiomyopathy 30-35%, multivessel CAD (Dr. Bishop Limbo)    CARDIAC CATHETERIZATION  09/13/2015   Procedure: Right/Left Heart Cath and Coronary/Graft Angiography;  Surgeon: [name], MD;  Location: [facility] INVASIVE CV LAB;  Service: Cardiovascular;;   CARDIOVERSION N/A 05/16/2019   Procedure: CARDIOVERSION;  Surgeon: [name], MD;  Location: [facility] ENDOSCOPY;  Service: Cardiovascular;  Laterality: N/A;   CARDIOVERSION N/A 10/16/2022   Procedure: CARDIOVERSION;  Surgeon: Thurmon Fair, MD;  Location: [facility] INVASIVE CV LAB;  Service: Cardiovascular;  Laterality: N/A;   CARDIOVERSION N/A 12/16/2022   Procedure: CARDIOVERSION;  Surgeon: Little Ishikawa, MD;  Location: [facility] INVASIVE CV LAB;  Service: Cardiovascular;  Laterality: N/A;   Colonscopy     CORONARY ARTERY BYPASS GRAFT  08/03/2011   Procedure: CORONARY ARTERY BYPASS GRAFTING (CABG);  Surgeon: Alleen Borne, MD;  Location: [facility] OR;  Service: Open Heart Surgery;  Laterality: N/A;  CABG times three using right saphenous vein and left mammary artery usisng endoscope.   LEFT HEART CATH AND CORS/GRAFTS ANGIOGRAPHY N/A 12/15/2021   Procedure: LEFT HEART CATH AND CORS/GRAFTS ANGIOGRAPHY;  Surgeon: Tonny Bollman, MD;  Location: [facility] INVASIVE CV LAB;  Service: Cardiovascular;  Laterality: N/A;   LEFT HEART CATHETERIZATION WITH CORONARY/GRAFT ANGIOGRAM N/A 09/20/2013   Procedure: LEFT HEART CATHETERIZATION WITH Isabel Caprice;  Surgeon: [name], MD;  Location: [facility] CATH LAB;  Service: Cardiovascular;  Laterality: N/A;   Lower Extremity Arterial Doppler  03/16/2013   bilat ABIs demonstated normal values; R runoff - posterior tibial & anterior tibial arteries occluded; L runoff - peroneal & posterior tibial arteries occluded, anterior tibial artery appears occluded   LUMBAR  LAMINECTOMY/DECOMPRESSION MICRODISCECTOMY N/A 09/12/2014   Procedure: LUMBAR DECOMPRESSION L3-L4, L4-L5, MICRODISCECTOMY L4-L5;  Surgeon: Jene Every, MD;  Location: [facility] ORS;  Service: Orthopedics;  Laterality: N/A;   PERIPHERAL VASCULAR BALLOON ANGIOPLASTY  11/14/2020   Procedure: PERIPHERAL VASCULAR BALLOON ANGIOPLASTY;  Surgeon: Cephus Shelling, MD;  Location: [facility] INVASIVE CV LAB;  Service: Cardiovascular;;   PERIPHERAL VASCULAR BALLOON ANGIOPLASTY Left 01/02/2021   Procedure: PERIPHERAL VASCULAR BALLOON ANGIOPLASTY;  Surgeon: Cephus Shelling, MD;  Location: [facility] INVASIVE CV LAB;  Service: Cardiovascular;  Laterality: Left;  Failed PTA   Pilonidal Cyst removed     TRANSTHORACIC ECHOCARDIOGRAM  11/10/2012   EF 40-45%, mild LVH, mild hypokinesis of anteroseptal myocardium, grade 1 diastolic dysfunction; mild MR & calcifed mitral annulus; LA mild-mod dilated; RA mildly dilated   Social History:  reports that he quit smoking about 11 years ago. His smoking use included cigars. He quit smokeless tobacco use about 11 years ago. He reports that he does not currently use alcohol after a past usage of about 1.0 - 2.0 standard drink of alcohol per week. He reports that he does not use drugs.  Allergies  Allergen Reactions   Entresto [Sacubitril-Valsartan] Other (See Comments)    dizziness   Sacubitril Other (See Comments)  Dizziness    Family History  Problem Relation Age of Onset   Cancer Mother    Lung disease Father        & heart disease   Colon polyps Father    Colon cancer Sister    Sudden death Maternal Grandmother    Anesthesia problems Daughter     Prior to Admission medications   Medication Sig Start Date End Date Taking? Authorizing Provider  acetaminophen (TYLENOL) 500 MG tablet Take 500-1,000 mg by mouth every 6 (six) hours as needed (for pain.).    [provider]  amiodarone (PACERONE) 200 MG tablet Take 2 tablets (400 mg total) TWICE a day for 2 weeks, then  take 1 tablet (200 mg total) TWICE a day for 2 weeks, then take 1 tablet (200 mg total) ONCE a day thereafter Patient taking differently: Take 100 mg by mouth daily. Take 0.5 tablets (100 mg total) once daily. 10/27/22   Camnitz, Andree Coss, MD  apixaban (ELIQUIS) 5 MG TABS tablet Take 5 mg by mouth 2 (two) times daily.    [provider]  carvedilol (COREG) 3.125 MG tablet Take 1 tablet (3.125 mg total) by mouth 2 (two) times daily with a meal. 08/20/22   [name], Lisette Abu, MD  empagliflozin (JARDIANCE) 25 MG TABS tablet Take 1 tablet (25 mg total) by mouth daily. 08/20/22   [name], Lisette Abu, MD  furosemide (LASIX) 40 MG tablet Take 1 1/2 tablet by mouth in the morning and 1 tablet by mouth in the afternoon for 4 days then go back down to 1 tablet by mouth twice a day Patient taking differently: Take 40 mg by mouth daily. Take 1  tablet by mouth in the morning and 1 tablet by mouth in the afternoon as needed. 10/22/22   Duke, Roe Rutherford, PA  gabapentin (NEURONTIN) 300 MG capsule Take 600 mg by mouth at bedtime.    [provider]  metFORMIN (GLUCOPHAGE-XR) 500 MG 24 hr tablet Take 1,000 mg by mouth 2 (two) times daily. 07/05/15   [provider]  Na Sulfate-K Sulfate-Mg Sulf 17.5-3.13-1.6 GM/177ML SOLN As directed 03/15/23   Rourk, Gerrit Friends, MD  [facility] VERIO test strip 1 each daily. 08/16/19   [provider]  rosuvastatin (CRESTOR) 10 MG tablet Take 1 tablet (10 mg total) by mouth every other day. 10/22/22   Marcelino Duster, PA    Physical Exam: Vitals:   04/17/23 1645 04/17/23 1700 04/17/23 1800 04/17/23 1848  BP: (!) 112/52 (!) 119/50 (!) 126/52 (!) 112/49  Pulse: (!) 58 61 63 62  Resp: 16 16 13 15   Temp:    98.1 F (36.7 C)  TempSrc:    Oral  SpO2: 100% 100% 100% 100%  Weight:    93.9 kg  Height:    6\' 2"  (1.88 m)  General: Pleasant, well-appearing middle-age man laying in bed. No acute distress. HEENT: Dry mucous membrane. Pale conjunctiva. CV:  AICD in place. No LE edema Pulmonary: Lungs CTAB. Normal effort. No wheezing or rales. Abdominal: Soft, nontender, nondistended. Normal bowel sounds. Extremities: Palpable radial and DP pulses. Normal ROM. Skin: Warm and dry. No obvious rash or lesions. Neuro: A&Ox3. Moves all extremities. Normal sensation. No focal deficit. Psych: Normal mood and affect   Data Reviewed:  EKG: Biventricular pacemaker: Atrially sensed ventricularly paced rhythm with occasional PVCs.  Assessment and Plan: [name] [name] is a 77 y.o. male with medical history significant of A-fib on Eliquis, CAD, T2DM, HTN and  CHF who presents to the ED for evaluation of rectal bleeding and admitted for lower GI bleed.  #Lower GI bleed Elderly man with a history of A-fib on Eliquis and a recent colonoscopy status post polypectomy presenting with 2 days of rectal bleeding. He has had a few bloody bowel movement in the ED.  Patient initially hypotensive but improved with IV hydration. Hemoglobin stable at the moment at 10.6. Patient's rectal bleed concerning for possible post polypectomy bleed. GI has been consulted and plan for repeat colonoscopy in the morning.  Status post 1 L of normal saline. -Admit to telemetry bed -GI consulted, appreciate their evaluation -Close monitoring of hemoglobin repeat H&H and a.m. CBC -Transfuse if Hgb < 7 or >2 point drop overnight -IV NS 100 cc/h for 10 hours -Continue to hold Eliquis -N.p.o. at midnight  #A-fib Patient had has AICD in place with atrially sensed and ventricularly paced rhythm.  Holding Eliquis in the setting of lower GI bleed. -Resume amiodarone 100 mg daily  #T2DM Blood sugar elevated to 200s to 300s.  Evidence of glucosuria on UA. -SSI with every 4 CBG checks while n.p.o. -Resume Jardiance and metformin  # HTN # CHF -Hold Coreg and Lasix   Advance Care Planning:   Code Status: Full Code   Consults: GI  Family Communication: Discussed admission with daughter  at bedside  Severity of Illness: The appropriate patient status for this patient is INPATIENT. Inpatient status is judged to be reasonable and necessary in order to provide the required intensity of service to ensure the patient's safety. The patient's presenting symptoms, physical exam findings, and initial radiographic and laboratory data in the context of their chronic comorbidities is felt to place them at high risk for further clinical deterioration. Furthermore, it is not anticipated that the patient will be medically stable for discharge from the hospital within 2 midnights of admission.   * I certify that at the point of admission it is my clinical judgment that the patient will require inpatient hospital care spanning beyond 2 midnights from the point of admission due to high intensity of service, high risk for further deterioration and high frequency of surveillance required.*  Author: Steffanie Rainwater, MD 04/17/2023 7:05 PM  For on call review [email or web address].

## 2023-04-17 NOTE — ED Notes (Signed)
Placed on cardiac monitoring

## 2023-04-17 NOTE — ED Notes (Signed)
Colonoscopy at 0900

## 2023-04-17 NOTE — ED Notes (Signed)
Attempted to call report-no answer 

## 2023-04-17 NOTE — ED Provider Notes (Addendum)
[place] EMERGENCY DEPARTMENT AT [facility] Provider Note   CSN: [id number] Arrival date & time: 04/17/23  1353     History  Chief Complaint  Patient presents with   Rectal Bleeding    [name] [name] is a 77 y.o. male.   Rectal Bleeding    This patient is a 77 year old male, he is on amiodarone and Eliquis for his atrial fibrillation, he is a diabetic on Jardiance and metformin, he presents after having a colonoscopy that was performed on October 16 by Dr. [name], he had a heme positive stool and a positive Cologuard test and underwent the colonoscopy during which time it was noted that he had scattered diverticula in the sigmoid and descending colon, there were 3 polyps that were removed with a cold snare, there was a polypectomy defect that was closed with Mantis clips.  The patient noted that yesterday he heard a metal cleaning in the toilet when he had a bowel movement, he did not see any metal but heard something hit the porcelain.  He has then had multiple bright red bloody bowel movement since that time and is now feeling weak, he was having some abdominal cramping yesterday which seems to have resolved.  He was bleeding so heavily last night that he was getting on his bed sheets.  He did not take Eliquis last night or this morning.  Home Medications Prior to Admission medications   Medication Sig Start Date End Date Taking? Authorizing Provider  acetaminophen (TYLENOL) 500 MG tablet Take 500-1,000 mg by mouth every 6 (six) hours as needed (for pain.).    [provider]  amiodarone (PACERONE) 200 MG tablet Take 2 tablets (400 mg total) TWICE a day for 2 weeks, then take 1 tablet (200 mg total) TWICE a day for 2 weeks, then take 1 tablet (200 mg total) ONCE a day thereafter Patient taking differently: Take 100 mg by mouth daily. Take 0.5 tablets (100 mg total) once daily. 10/27/22   Camnitz, Andree Coss, MD  apixaban (ELIQUIS) 5 MG TABS tablet Take 5 mg by  mouth 2 (two) times daily.    [provider]  carvedilol (COREG) 3.125 MG tablet Take 1 tablet (3.125 mg total) by mouth 2 (two) times daily with a meal. 08/20/22   [name], Lisette Abu, MD  empagliflozin (JARDIANCE) 25 MG TABS tablet Take 1 tablet (25 mg total) by mouth daily. 08/20/22   [name], Lisette Abu, MD  furosemide (LASIX) 40 MG tablet Take 1 1/2 tablet by mouth in the morning and 1 tablet by mouth in the afternoon for 4 days then go back down to 1 tablet by mouth twice a day Patient taking differently: Take 40 mg by mouth daily. Take 1  tablet by mouth in the morning and 1 tablet by mouth in the afternoon as needed. 10/22/22   Duke, Roe Rutherford, PA  gabapentin (NEURONTIN) 300 MG capsule Take 600 mg by mouth at bedtime.    [provider]  metFORMIN (GLUCOPHAGE-XR) 500 MG 24 hr tablet Take 1,000 mg by mouth 2 (two) times daily. 07/05/15   [provider]  Na Sulfate-K Sulfate-Mg Sulf 17.5-3.13-1.6 GM/177ML SOLN As directed 03/15/23   Rourk, Gerrit Friends, MD  [facility] VERIO test strip 1 each daily. 08/16/19   [provider]  rosuvastatin (CRESTOR) 10 MG tablet Take 1 tablet (10 mg total) by mouth every other day. 10/22/22   Marcelino Duster, PA      Allergies  Entresto [sacubitril-valsartan] and Sacubitril    Review of Systems   Review of Systems  Gastrointestinal:  Positive for hematochezia.  All other systems reviewed and are negative.   Physical Exam Updated Vital Signs BP (!) 82/36 (BP Location: Right Arm)   Pulse (!) 51   Temp 97.9 F (36.6 C)   Resp 18   Ht 1.88 m (6\' 2" )   Wt 93.4 kg   SpO2 99%   BMI 26.44 kg/m  Physical Exam Vitals and nursing note reviewed.  Constitutional:      General: He is not in acute distress.    Appearance: He is well-developed.  HENT:     Head: Normocephalic and atraumatic.     Mouth/Throat:     Pharynx: No oropharyngeal exudate.  Eyes:     General: No scleral icterus.       Right eye: No discharge.         Left eye: No discharge.     Conjunctiva/sclera: Conjunctivae normal.     Pupils: Pupils are equal, round, and reactive to light.  Neck:     Thyroid: No thyromegaly.     Vascular: No JVD.  Cardiovascular:     Rate and Rhythm: Normal rate and regular rhythm.     Heart sounds: Normal heart sounds. No murmur heard.    No friction rub. No gallop.  Pulmonary:     Effort: Pulmonary effort is normal. No respiratory distress.     Breath sounds: Normal breath sounds. No wheezing or rales.  Abdominal:     General: Bowel sounds are normal. There is no distension.     Palpations: Abdomen is soft. There is no mass.     Tenderness: There is no abdominal tenderness.  Musculoskeletal:        General: No tenderness. Normal range of motion.     Cervical back: Normal range of motion and neck supple.     Right lower leg: Edema present.     Left lower leg: Edema present.  Lymphadenopathy:     Cervical: No cervical adenopathy.  Skin:    General: Skin is warm and dry.     Findings: No erythema or rash.  Neurological:     Mental Status: He is alert.     Coordination: Coordination normal.  Psychiatric:        Behavior: Behavior normal.     ED Results / Procedures / Treatments   Labs (all labs ordered are listed, but only abnormal results are displayed) Labs Reviewed  COMPREHENSIVE METABOLIC PANEL - Abnormal; Notable for the following components:      Result Value   Glucose, Bld 301 (*)    Calcium 8.4 (*)    Total Protein 6.4 (*)    Albumin 3.4 (*)    AST 14 (*)    All other components within normal limits  CBC - Abnormal; Notable for the following components:   RBC 3.72 (*)    Hemoglobin 10.6 (*)    HCT 32.3 (*)    All other components within normal limits  CBG MONITORING, ED - Abnormal; Notable for the following components:   Glucose-Capillary 252 (*)    All other components within normal limits  URINALYSIS, ROUTINE W REFLEX MICROSCOPIC  CBG MONITORING, ED  POC OCCULT BLOOD, ED   TYPE AND SCREEN    EKG EKG Interpretation Date/Time:  Saturday April 17 2023 14:43:35 EDT Ventricular Rate:  54 PR Interval:  134 QRS Duration:  154 QT Interval:  586 QTC  Calculation: 555 R Axis:   167  Text Interpretation: Atrial-sensed ventricular-paced rhythm with occasional Premature ventricular complexes Biventricular pacemaker detected Abnormal ECG When compared with ECG of 26-Feb-2023 08:50, Premature ventricular complexes are now Present Vent. rate has increased BY   3 BPM since last tracing no significant change Confirmed by Eber Hong ([id number]) on 04/17/2023 3:28:40 PM  Radiology No results found.  Procedures .Critical Care  Performed by: Eber Hong, MD Authorized by: Eber Hong, MD   Critical care provider statement:    Critical care time (minutes):  45   Critical care time was exclusive of:  Separately billable procedures and treating other patients and teaching time   Critical care was necessary to treat or prevent imminent or life-threatening deterioration of the following conditions:  Shock (GI  bleed)   Critical care was time spent personally by me on the following activities:  Development of treatment plan with patient or surrogate, discussions with consultants, evaluation of patient's response to treatment, examination of patient, obtaining history from patient or surrogate, review of old charts, re-evaluation of patient's condition, pulse oximetry, ordering and review of radiographic studies, ordering and review of laboratory studies and ordering and performing treatments and interventions   Care discussed with: admitting provider       Medications Ordered in ED Medications - No data to display  ED Course/ Medical Decision Making/ A&P                                 Medical Decision Making Amount and/or Complexity of Data Reviewed Labs: ordered.  Risk Prescription drug management. Decision regarding hospitalization.    This patient  presents to the ED for concern of GI bleed, this involves an extensive number of treatment options, and is a complaint that carries with it a high risk of complications and morbidity.  The differential diagnosis includes bleeding from resection site of polyps, could be hemorrhoids, doubt fissures given lack of tenderness, doubt diverticula given color of blood and recent procedure   Co morbidities that complicate the patient evaluation  On Eliquis for A-fib   Additional history obtained:  Additional history obtained from medical record External records from outside source obtained and reviewed including colonoscopy report   Lab Tests:  I Ordered, and personally interpreted labs.  The pertinent results include: Progressive anemia    Cardiac Monitoring: / EKG:  The patient was maintained on a cardiac monitor.  I personally viewed and interpreted the cardiac monitored which showed an underlying rhythm of: Mild bradycardia   Consultations Obtained:  I requested consultation with the Dr. Levon Hedger with gastroenterology and the hospitalist Dr. Kerby Less,  and discussed lab and imaging findings as well as pertinent plan - they recommend: Bowel prep with a colonoscopy in the morning, n.p.o. after midnight   Problem List / ED Course / Critical interventions / Medication management  The patient is pale, I suspect that he has had some progressive bleeding, he will need fluid resuscitation, he may need a transfusion Blood transfusion added for worsening blood pressure, currently 82/36, had another large blood loss bowel movement I ordered medication including IV fluids for hypotension Reevaluation of the patient after these medicines showed that the patient improved I have reviewed the patients home medicines and have made adjustments as needed   Social Determinants of Health:  [place] ill with GI bleed   Test / Admission - Considered:  Admit to high level of  care         Final Clinical Impression(s) / ED Diagnoses Final diagnoses:  Rectal bleeding    Rx / DC Orders ED Discharge Orders     None         Eber Hong, MD 04/17/23 1615    Eber Hong, MD 04/17/23 [phone]

## 2023-04-17 NOTE — Telephone Encounter (Signed)
KOH positive;  needs Diflucan 200mg  load, followed by 100mg  daily x 3 weeks; check with pharmacist given current meds to assure dose is appropriate.  Thanks.

## 2023-04-17 NOTE — ED Triage Notes (Signed)
Pt comes in with rectal bleeding starting yesterday after having a colonoscopy on Wednesday where they clamped polyps. Pt also having severe head pain (it just feels off.) Pt takes eliquis for a-fib.

## 2023-04-17 NOTE — ED Notes (Signed)
Pt had a BM of dark red blood with multiple blood clots in stool. Blood clots were bigger than golf ball size. Over of bloody liquid stool.

## 2023-04-18 ENCOUNTER — Encounter (HOSPITAL_COMMUNITY)
Admission: EM | Disposition: A | Discharge status: Home / Self Care | Payer: Self-pay | Source: Home / Self Care | Attending: Internal Medicine

## 2023-04-18 ENCOUNTER — Inpatient Hospital Stay (HOSPITAL_COMMUNITY): Payer: Medicare Other | Admitting: Anesthesiology

## 2023-04-18 DIAGNOSIS — I10 Essential (primary) hypertension: Secondary | ICD-10-CM

## 2023-04-18 DIAGNOSIS — I5042 Chronic combined systolic (congestive) and diastolic (congestive) heart failure: Secondary | ICD-10-CM | POA: Diagnosis not present

## 2023-04-18 DIAGNOSIS — K922 Gastrointestinal hemorrhage, unspecified: Secondary | ICD-10-CM

## 2023-04-18 DIAGNOSIS — K625 Hemorrhage of anus and rectum: Secondary | ICD-10-CM | POA: Diagnosis not present

## 2023-04-18 DIAGNOSIS — D123 Benign neoplasm of transverse colon: Secondary | ICD-10-CM | POA: Diagnosis not present

## 2023-04-18 DIAGNOSIS — D122 Benign neoplasm of ascending colon: Secondary | ICD-10-CM

## 2023-04-18 DIAGNOSIS — E1169 Type 2 diabetes mellitus with other specified complication: Secondary | ICD-10-CM | POA: Diagnosis not present

## 2023-04-18 DIAGNOSIS — K633 Ulcer of intestine: Secondary | ICD-10-CM

## 2023-04-18 DIAGNOSIS — I5043 Acute on chronic combined systolic (congestive) and diastolic (congestive) heart failure: Secondary | ICD-10-CM

## 2023-04-18 HISTORY — PX: COLONOSCOPY WITH PROPOFOL: SHX5780

## 2023-04-18 LAB — HEMOGLOBIN AND HEMATOCRIT, BLOOD
HCT: 28.5 % — ABNORMAL LOW (ref 39.0–52.0)
HCT: 27.4 % — ABNORMAL LOW (ref 39.0–52.0)
Hemoglobin: 9.4 g/dL — ABNORMAL LOW (ref 13.0–17.0)
Hemoglobin: 9 g/dL — ABNORMAL LOW (ref 13.0–17.0)

## 2023-04-18 LAB — CBC
HCT: 24.8 % — ABNORMAL LOW (ref 39.0–52.0)
Hemoglobin: 8.3 g/dL — ABNORMAL LOW (ref 13.0–17.0)
MCH: 28.8 pg (ref 26.0–34.0)
MCHC: 33.5 g/dL (ref 30.0–36.0)
MCV: 86.1 fL (ref 80.0–100.0)
Platelets: 145 10*3/uL — ABNORMAL LOW (ref 150–400)
RBC: 2.88 MIL/uL — ABNORMAL LOW (ref 4.22–5.81)
RDW: 15.2 % (ref 11.5–15.5)
WBC: 6.5 10*3/uL (ref 4.0–10.5)
nRBC: 0 % (ref 0.0–0.2)

## 2023-04-18 LAB — GLUCOSE, CAPILLARY
Glucose-Capillary: 168 mg/dL — ABNORMAL HIGH (ref 70–99)
Glucose-Capillary: 160 mg/dL — ABNORMAL HIGH (ref 70–99)
Glucose-Capillary: 253 mg/dL — ABNORMAL HIGH (ref 70–99)
Glucose-Capillary: 151 mg/dL — ABNORMAL HIGH (ref 70–99)
Glucose-Capillary: 169 mg/dL — ABNORMAL HIGH (ref 70–99)

## 2023-04-18 LAB — PREPARE RBC (CROSSMATCH)

## 2023-04-18 SURGERY — COLONOSCOPY WITH PROPOFOL
Anesthesia: General

## 2023-04-18 MED ORDER — PHENYLEPHRINE HCL (PRESSORS) 10 MG/ML IV SOLN
INTRAVENOUS | Status: DC | PRN
  Administered 2023-04-18 (×2): 100 ug via INTRAVENOUS

## 2023-04-18 MED ORDER — SODIUM CHLORIDE 0.9% IV SOLUTION: Freq: Once | INTRAVENOUS | Status: AC

## 2023-04-18 MED ORDER — FLUCONAZOLE 100 MG PO TABS
100.0000 mg | ORAL_TABLET | Freq: Every day | ORAL | Status: DC
  Administered 2023-04-19 – 2023-04-21 (×3): 100 mg via ORAL
  Filled 2023-04-18 (×8): qty 1

## 2023-04-18 MED ORDER — INSULIN ASPART 100 UNIT/ML IJ SOLN
0.0000 [IU] | Freq: Three times a day (TID) | INTRAMUSCULAR | Status: DC
  Administered 2023-04-19: 3 [IU] via SUBCUTANEOUS
  Administered 2023-04-19: 5 [IU] via SUBCUTANEOUS
  Administered 2023-04-19 – 2023-04-20 (×2): 2 [IU] via SUBCUTANEOUS
  Administered 2023-04-20 – 2023-04-21 (×4): 3 [IU] via SUBCUTANEOUS

## 2023-04-18 MED ORDER — EPHEDRINE SULFATE (PRESSORS) 50 MG/ML IJ SOLN
INTRAMUSCULAR | Status: DC | PRN
  Administered 2023-04-18 (×3): 10 mg via INTRAVENOUS

## 2023-04-18 MED ORDER — FLUCONAZOLE 100 MG PO TABS
200.0000 mg | ORAL_TABLET | Freq: Once | ORAL | Status: AC
  Administered 2023-04-18: 200 mg via ORAL
  Filled 2023-04-18: qty 1
  Filled 2023-04-18: qty 2

## 2023-04-18 NOTE — Plan of Care (Signed)
  Problem: Education: Goal: Knowledge of General Education information will improve Description Including pain rating scale, medication(s)/side effects and non-pharmacologic comfort measures Outcome: Progressing   

## 2023-04-18 NOTE — Progress Notes (Signed)
Progress Note  Patient was admitted yesterday in the evening due to lower GI bleed.  Admitting physician once blood transfusion for hemoglobin less than 7 or greater than 2 point drop overnight.  Hemoglobin on admission was 10.6, hemoglobin this morning was 8.3 (greater than 2 unit drop overnight). Type and screen was done and 1 unit of PRBC was ordered.

## 2023-04-18 NOTE — Brief Op Note (Addendum)
04/18/2023  10:21 AM  PATIENT:  [name] [name]  77 y.o. male  PRE-OPERATIVE DIAGNOSIS:  hematochezia, post polypectomy bleeding  POST-OPERATIVE DIAGNOSIS:  diverticulosis;one polyp in transverse and one in ascending not removed;  PROCEDURE:  Procedure(s): COLONOSCOPY WITH PROPOFOL (N/A)  SURGEON:  Surgeons and Role:    * Dolores Frame, MD - Primary  Patient underwent colonoscopy under propofol sedation.  Tolerated the procedure adequately.   FINDINGS: - Preparation of the colon was fair.  - Two 2 to 3 mm polyps in the transverse colon and in the ascending colon. Not removed. - A single (solitary) postpolypectomy  ulcer in the cecum.  Clips were placed.  Clip manufacturer: [employer].  - A single (solitary) postpolypectomy   ulcer in the ascending colon.  - Two clips in the recto-sigmoid colon at site of previous polypectomy. Two more clips placed at polypectomy site to prevent recurrent bleeding. - The distal rectum and anal verge are normal on retroflexion view.   RECOMMENDATIONS - Return patient to hospital ward for ongoing care.  - Clear liquid diet today. Can advance tomorrow if tolerated. - Restart Eliquis in 7 days. - Check H/H every 12 hours. - Start fluconazole 200 mg today and continue with 100 mg daily for a total of 21 days.  Katrinka Blazing, MD Gastroenterology and Hepatology [facility] Gastroenterology

## 2023-04-18 NOTE — Progress Notes (Addendum)
Reports two bms since colonoscopy which have been clear liquid with no signs of blood except a trace of blood when wiping. Scds applied and messaged Dr. Blake Divine that patient asking for home med lasix to resume in morning,

## 2023-04-18 NOTE — Progress Notes (Signed)
Patient first unit of blood  transfusing now.  No reaction noted.

## 2023-04-18 NOTE — Progress Notes (Signed)
[facility]                                                                               [name] [name], is a 77 y.o. male, DOB - 04-Mar-1946, UEA:[id number] Admit date - 04/17/2023    Outpatient Primary MD for the patient is [name], [name], MD  LOS - 1  days    Brief summary    [name] [name] is a 77 y.o. male with medical history significant of A-fib on Eliquis, CAD, T2DM, HTN and CHF who presents to the ED for evaluation of rectal bleeding.  Patient reports that he underwent colonoscopy 3 days ago was found to have multiple scattered diverticula as well as 3 polyps that were clipped with cold snare.  He was having normal soft bowel movements until yesterday when he noticed bright red blood from his rectum during bowel movements.  On arrival to ED, his BP were soft, and his stool was positive for blood. His hemoglobin at baseline around 11 dropped to 8.3. 1 unit of prbc transfusion given and his hemoglobin improved to 9.4.  Gi consulted and he underwent repeat colonoscopy, showing A single (solitary) postpolypectomy ulcer in the cecum. Clips were placed. Two clips in the recto-sigmoid colon at site of previous polypectomy. Two more clips placed at polypectomy site to prevent recurrent bleeding,. A single (solitary) postpolypectomy ulcer in the ascending colon .  He was started on clears and eliquis held.    Assessment & Plan    Assessment and Plan:  Lower GI bleed/ post polypectomy bleeding.  S/p colonoscopy today showing A single (solitary) postpolypectomy ulcer in the cecum. Clips were placed. Two clips in the recto-sigmoid colon at site of previous polypectomy. Two more clips placed at polypectomy site to prevent recurrent bleeding,. A single (solitary) postpolypectomy ulcer in the ascending colon .  He was started on clears and eliquis held.  Monitor H&h every 12 hours.    Atrial fibrillation Rate controlled with amiodarone. Eliquis is to be held for 7 days.     Type 2 DM CBG (last 3)  Recent Labs    04/18/23 0831 04/18/23 1242 04/18/23 1622  GLUCAP 160* 253* 151*   Resume SSI.  Hold metformin.    Hypertension:  BP continues to be borderline.    Chronic systolic and diastolic CHF.  He appears compensated.  Last echo shows LVEF of 30 to 35%, with grade 3 diastolic dysfunction on 01/2023.   Estimated body mass index is 26.58 kg/m as calculated from the following:   Height as of this encounter: 6\' 2"  (1.88 m).   Weight as of this encounter: 93.9 kg.  Code Status: full code.  DVT Prophylaxis:  SCDs Start: 04/17/23 1748   Level of Care: Level of care: Telemetry Family Communication: Updated patient's family at bedside.   Disposition Plan:     Remains inpatient appropriate: monitor hemoglobin  Procedures:  Colonoscopy.  Consultants:   gastroenterology  Antimicrobials:   Anti-infectives (From admission, onward)    Start     Dose/Rate Route Frequency Ordered Stop   04/19/23 1000  fluconazole (DIFLUCAN) tablet 100 mg        100  mg Oral Daily 04/18/23 1032 05/09/23 0959   04/18/23 1045  fluconazole (DIFLUCAN) tablet 200 mg        200 mg Oral  Once 04/18/23 1032 04/18/23 1110        Medications  Scheduled Meds:  sodium chloride   Intravenous Once   amiodarone  100 mg Oral QHS   empagliflozin  25 mg Oral Daily   [START ON 04/19/2023] fluconazole  100 mg Oral Daily   gabapentin  600 mg Oral QHS   [START ON 04/19/2023] insulin aspart  0-15 Units Subcutaneous TID WC   metFORMIN  1,000 mg Oral BID WC   Continuous Infusions: PRN Meds:.acetaminophen **OR** acetaminophen, ondansetron **OR** ondansetron (ZOFRAN) IV, senna-docusate    Subjective:   [name] [name] was seen and examined today.  Reports feeling much better.  No nausea, vomiting or abd pain, no more rectal bleeding.   Objective:   Vitals:   04/18/23 1011 04/18/23 1015 04/18/23 1029 04/18/23 1404  BP: (!) 95/48 (!) 104/49 (!) 104/51 (!) 106/52   Pulse: 60 (!) 58 (!) 58 (!) 54  Resp: 16  11 16   Temp: 98.3 F (36.8 C)   98.2 F (36.8 C)  TempSrc:      SpO2: 100% 100% 100% 96%  Weight:      Height:        Intake/Output Summary (Last 24 hours) at 04/18/2023 1717 Last data filed at 04/18/2023 1304 Gross per 24 hour  Intake 806 ml  Output 1800 ml  Net -994 ml   Filed Weights   04/17/23 1438 04/17/23 1848  Weight: 93.4 kg 93.9 kg     Exam General exam: Appears calm and comfortable  Respiratory system: Clear to auscultation. Respiratory effort normal. Cardiovascular system: S1 & S2 heard, RRR. No JVD,  Gastrointestinal system: Abdomen is nondistended, soft and nontender.  Central nervous system: Alert and oriented. No focal neurological deficits. Extremities: Symmetric 5 x 5 power. Skin: No rashes, lesions or ulcers Psychiatry: mood is appropriate.    Data Reviewed:  I have personally reviewed following labs and imaging studies   CBC Lab Results  Component Value Date   WBC 6.5 04/18/2023   RBC 2.88 (L) 04/18/2023   HGB 9.4 (L) 04/18/2023   HCT 28.5 (L) 04/18/2023   MCV 86.1 04/18/2023   MCH 28.8 04/18/2023   PLT 145 (L) 04/18/2023   MCHC 33.5 04/18/2023   RDW 15.2 04/18/2023   LYMPHSABS 1.5 04/09/2023   MONOABS 0.6 04/09/2023   EOSABS 0.2 04/09/2023   BASOSABS 0.0 04/09/2023     Last metabolic panel Lab Results  Component Value Date   NA 136 04/17/2023   K 3.7 04/17/2023   CL 102 04/17/2023   CO2 26 04/17/2023   BUN 20 04/17/2023   CREATININE 0.92 04/17/2023   GLUCOSE 301 (H) 04/17/2023   GFRNONAA >60 04/17/2023   GFRAA 80 01/22/2020   CALCIUM 8.4 (L) 04/17/2023   PROT 6.4 (L) 04/17/2023   ALBUMIN 3.4 (L) 04/17/2023   LABGLOB 3.1 11/17/2021   AGRATIO 1.3 11/17/2021   BILITOT 1.1 04/17/2023   ALKPHOS 100 04/17/2023   AST 14 (L) 04/17/2023   ALT 22 04/17/2023   ANIONGAP 8 04/17/2023    CBG (last 3)  Recent Labs    04/18/23 0831 04/18/23 1242 04/18/23 1622  GLUCAP 160* 253* 151*       Coagulation Profile: No results for input(s): "INR", "PROTIME" in the last 168 hours.   Radiology Studies: No results found.  [name] [name] M.D. [facility] 04/18/2023, 5:17 PM  Available via Epic secure chat 7am-7pm After 7 pm, please refer to night coverage provider listed on amion.

## 2023-04-18 NOTE — TOC CM/SW Note (Signed)
Transition of Care [facility]) - Inpatient Brief Assessment   Patient Details  Name: [name] [name] MRN: [id number] Date of Birth: 1946/03/03  [facility] of [facility]) CM/SW Contact:    Villa Herb, LCSWA Phone Number: 04/18/2023, 11:38 AM   Clinical Narrative: Transition of Care Department [facility]) has reviewed patient and no TOC needs have been identified at this time. We will continue to monitor patient advancement through interdisciplinary progression rounds. If new patient transition needs arise, please place a TOC consult.  Transition of Care Asessment: Insurance and Status: Insurance coverage has been reviewed Patient has primary care physician: Yes Home environment has been reviewed: from home Prior level of function:: independent Prior/Current Home Services: No current home services Social Determinants of Health Reivew: SDOH reviewed no interventions necessary Readmission risk has been reviewed: Yes Transition of care needs: no transition of care needs at this time

## 2023-04-18 NOTE — Anesthesia Preprocedure Evaluation (Signed)
Anesthesia Evaluation  Patient identified by MRN, date of birth, ID band Patient awake    Reviewed: Allergy & Precautions, H&P , NPO status , Patient's Chart, lab work & pertinent test results, reviewed documented beta blocker date and time   Airway Mallampati: II  TM Distance: >3 FB Neck ROM: full    Dental no notable dental hx.    Pulmonary neg pulmonary ROS, shortness of breath, sleep apnea , former smoker   Pulmonary exam normal breath sounds clear to auscultation       Cardiovascular Exercise Tolerance: Good hypertension, + CAD, + Peripheral Vascular Disease and +CHF  negative cardio ROS + dysrhythmias + Cardiac Defibrillator  Rhythm:regular Rate:Normal     Neuro/Psych CVA negative neurological ROS  negative psych ROS   GI/Hepatic negative GI ROS, Neg liver ROS,,,  Endo/Other  negative endocrine ROSdiabetes    Renal/GU negative Renal ROS  negative genitourinary   Musculoskeletal   Abdominal   Peds  Hematology negative hematology ROS (+) Blood dyscrasia, anemia   Anesthesia Other Findings   Reproductive/Obstetrics negative OB ROS                             Anesthesia Physical Anesthesia Plan  ASA: 3  Anesthesia Plan: General   Post-op Pain Management:    Induction:   PONV Risk Score and Plan: Propofol infusion  Airway Management Planned:   Additional Equipment:   Intra-op Plan:   Post-operative Plan:   Informed Consent: I have reviewed the patients History and Physical, chart, labs and discussed the procedure including the risks, benefits and alternatives for the proposed anesthesia with the patient or authorized representative who has indicated his/her understanding and acceptance.     Dental Advisory Given  Plan Discussed with: CRNA  Anesthesia Plan Comments:        Anesthesia Quick Evaluation

## 2023-04-18 NOTE — Op Note (Addendum)
[facility] Patient Name: [name] [name] Procedure Date: 04/18/2023 8:39 AM MRN: [id number] Date of Birth: [birth year] Attending MD: Katrinka Blazing , , [id number] CSN: [id number] Age: 77 Admit Type: Inpatient Procedure:                Colonoscopy Indications:              Rectal bleeding, recent colonoscopy Providers:                Katrinka Blazing, Nena Polio, RN, Cyril Mourning, Technician Referring MD:              Medicines:                Monitored Anesthesia Care Complications:            No immediate complications. Estimated Blood Loss:     Estimated blood loss: none. Procedure:                Pre-Anesthesia Assessment:                           - Prior to the procedure, a History and Physical                            was performed, and patient medications, allergies                            and sensitivities were reviewed. The patient's                            tolerance of previous anesthesia was reviewed.                           - The risks and benefits of the procedure and the                            sedation options and risks were discussed with the                            patient. All questions were answered and informed                            consent was obtained.                           - ASA Grade Assessment: III - A patient with severe                            systemic disease.                           After obtaining informed consent, the colonoscope                            was passed under direct vision. Throughout the  procedure, the patient's blood pressure, pulse, and                            oxygen saturations were monitored continuously. The                            [id number]-HQ190L ([id number]) scope was introduced through                            the anus and advanced to the the cecum, identified                            by appendiceal orifice and ileocecal valve. The                             colonoscopy was performed without difficulty. The                            patient tolerated the procedure well. The quality                            of the bowel preparation was fair. Scope In: 9:13:39 AM Scope Out: 10:08:48 AM Scope Withdrawal Time: 0 hours 32 minutes 41 seconds  Total Procedure Duration: 0 hours 55 minutes 9 seconds  Findings:      The perianal and digital rectal examinations were normal.      Two sessile polyps were found in the transverse colon and ascending       colon. The polyps were 2 to 3 mm in size. No resection was performed       gien recent bleeding.      A single (solitary) six mm postpolypectomy ulcer was found in the cecum.       Oozing was present. No stigmata of recent bleeding were seen. For       hemostasis, two hemostatic clips were successfully placed. Clip       manufacturer: [employer]. There was no bleeding at the end of the       procedure.      A single (solitary) six mm postpolypectomy ulcer was found in the       ascending colon. No bleeding was present.      Two hemostatic clips were found at the site of previous polypectomy in       the recto-sigmoid colon. There was a clot attached to the polypectomy       area, which was removed. There was no active bleeding, but a defect was       found next to one of the clips. To prevent recurrent bleeding post, two       Ulta hemostatic clips were successfully placed. Clip manufacturer:       [employer]. There was no bleeding at the end of the procedure.      The retroflexed view of the distal rectum and anal verge was normal and       showed no anal or rectal abnormalities. Impression:               - Preparation of the colon was fair.                           -  Two 2 to 3 mm polyps in the transverse colon and                            in the ascending colon. Not removed.                           - A single (solitary) postpolypectomy ulcer in the                             cecum. Clips were placed. Clip manufacturer: [employer].                           - A single (solitary) postpolypectomy ulcer in the                            ascending colon.                           - Two clips in the recto-sigmoid colon at site of                            previous polypectomy. Two more clips placed at                            polypectomy site to prevent recurrent bleeding.                           - The distal rectum and anal verge are normal on                            retroflexion view.                           - No specimens collected. Moderate Sedation:      Per Anesthesia Care Recommendation:           - Return patient to hospital ward for ongoing care.                           - Clear liquid diet today. Can advance tomorrow if                            tolerated.                           - Restart Eliquis in 7 days.                           - Check H/H every 12 hours.                           - Start fluconazole 200 mg today and continue with  100 mg daily for a total of 21 days. Procedure Code(s):        --- Professional ---                           [phone], Colonoscopy, flexible; with control of                            bleeding, any method Diagnosis Code(s):        --- Professional ---                           D12.3, Benign neoplasm of transverse colon (hepatic                            flexure or splenic flexure)                           D12.2, Benign neoplasm of ascending colon                           K63.3, Ulcer of intestine                           T18.5XXA, Foreign body in anus and rectum, initial                            encounter                           K62.5, Hemorrhage of anus and rectum CPT copyright 2022 American Medical Association. All rights reserved. The codes documented in this report are preliminary and upon coder review may  be  revised to meet current compliance requirements. Katrinka Blazing, MD Katrinka Blazing,  04/18/2023 10:22:34 AM This report has been signed electronically. Number of Addenda: 0

## 2023-04-18 NOTE — Anesthesia Postprocedure Evaluation (Signed)
Anesthesia Post Note  Patient: [name] [name]  Procedure(s) Performed: COLONOSCOPY WITH PROPOFOL  Patient location during evaluation: PACU Anesthesia Type: General Level of consciousness: awake and alert Pain management: pain level controlled Vital Signs Assessment: post-procedure vital signs reviewed and stable Respiratory status: spontaneous breathing, nonlabored ventilation, respiratory function stable and patient connected to nasal cannula oxygen Cardiovascular status: blood pressure returned to baseline and stable Postop Assessment: no apparent nausea or vomiting Anesthetic complications: no   No notable events documented.   Last Vitals:  Vitals:   04/18/23 0850 04/18/23 0906  BP: (!) 103/50   Pulse:    Resp:    Temp: 36.8 C 36.8 C  SpO2:      Last Pain:  Vitals:   04/18/23 0906  TempSrc: Oral  PainSc:                  Windell Norfolk

## 2023-04-18 NOTE — Consult Note (Signed)
Katrinka Blazing, M.D. Gastroenterology & Hepatology                                           Patient Name: [name] [name] Account #: @FLAACCTNO @   MRN: [id number] Admission Date: 04/17/2023 Date of Evaluation:  04/18/2023 Time of Evaluation: 8:58 AM   Referring Physician:Brian Hyacinth Meeker, MD  Chief Complaint:  large rectal bleeding  HPI:  This is a 77 y.o. male with history of coronary artery disease, CHF status post ICD placement, atrial fibrillation, BPH, hyperlipidemia, diabetes, hypertension, sleep apnea and stroke, who was admitted to the hospital after presenting large rectal bleeding.  The patient underwent recent colonoscopy with polypectomy on 04/14/2023.  Patient reports that he underwent colonoscopy on Wednesday and he noticed presence of new onset large rectal bleeding since Friday afternoon.  He reports multiple episodes of large amount of fresh blood with some clots.  Denies any abdominal pain, nausea, vomiting, fever, chills, abdominal distention.  He reports getting concerned about the bleeding and did not take his Eliquis on Friday night.  He denied taking any NSAIDs.  He underwent EGD and colonoscopy in 04/14/2023 with Dr. [name].  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  Normal duodenum.  Colonoscopy showed 3 polyps in the rectosigmoid, ascending and cecum which were removed with a cold snare as her size was between 4 to 6 mm.  There was 20 x 10 mm polyp in the rectosigmoid colon which was semipedunculated.  This was lifted with Eleview for endoscopic mucosal resection and piecemeal polypectomy was performed.  2 Mantis clips were placed and tattoo was performed 2 cm distal from the lesion.  Notably, the patient reports that on Friday he heard a "clank" in his toilet when he defecated, so he believes that the clip fell off.  In the ED, he was initially HD stable and  afebrile, although in the ED his blood pressure dropped down to 82/36 but responded to IV fluids. Labs were remarkable for hemoglobin of 10.6, MCV 86, rest of cell lines within normal limits, CMP with glucose 301, normal electrolytes and renal function, normal liver function panel.  Repeat hemoglobin today in the morning was 8.3.  He was given 1 unit of PRBC today in the morning.   Past Medical History: SEE CHRONIC ISSSUES: Past Medical History:  Diagnosis Date   AICD (automatic cardioverter/defibrillator) present    Arthritis    Knee L - probably   Atrial fibrillation (HCC)    BPH (benign prostatic hyperplasia)    CAD (coronary artery disease) 07/06/2011   CHF (congestive heart failure) (HCC)    Coronary artery disease    Diabetes mellitus    Frequency of urination    Heart failure (HCC)    High cholesterol    History of nuclear stress test 09/2010   dipyridamole; mild perfusion defect due to attenuation with mild superimposed ischemia in apical septal, apical, apical inferior & apical lateral regions; rest LV enlarged in size; prominent gut uptake in infero-apical region; no significant ischemia demonstrated; low risk scan    HNP (herniated nucleus pulposus), lumbar    Hypertension    Ischemic cardiomyopathy    LV dysfunction 07/06/2011   Nocturia    Right bundle branch block    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 Wayland Salinas   Sleep apnea    sleep study 10/2010- AHI during total sleep 32.1/hr and during REM 62.3/hr (severe sleep apnea)unable to tolerate c pap   Spinal stenosis of lumbar region    Stroke [facility])    [place]    Past Surgical History:  Past Surgical History:  Procedure Laterality Date   ABDOMINAL AORTOGRAM W/LOWER EXTREMITY Left 11/14/2020   Procedure: ABDOMINAL AORTOGRAM W/LOWER EXTREMITY;  Surgeon: Cephus Shelling, MD;  Location: [facility] INVASIVE CV LAB;  Service:  Cardiovascular;  Laterality: Left;   ABDOMINAL AORTOGRAM W/LOWER EXTREMITY N/A 01/02/2021   Procedure: ABDOMINAL AORTOGRAM W/LOWER EXTREMITY;  Surgeon: Cephus Shelling, MD;  Location: [facility] INVASIVE CV LAB;  Service: Cardiovascular;  Laterality: N/A;   AMPUTATION Left 03/20/2021   Procedure: Left foot 5th ray amputation;  Surgeon: Toni Arthurs, MD;  Location: [facility] OR;  Service: Orthopedics;  Laterality: Left;   ATRIAL FIBRILLATION ABLATION N/A 01/19/2023   Procedure: ATRIAL FIBRILLATION ABLATION;  Surgeon: [name], MD;  Location: [facility] INVASIVE CV LAB;  Service: Cardiovascular;  Laterality: N/A;   BIV ICD INSERTION CRT-D N/A 04/30/2022   Procedure: BIV ICD INSERTION CRT-D;  Surgeon: [name], MD;  Location: [facility] INVASIVE CV LAB;  Service: Cardiovascular;  Laterality: N/A;   CARDIAC CATHETERIZATION  01/2011   ischemic cardiomyopathy 30-35%, multivessel CAD (Dr. Bishop Limbo)    CARDIAC CATHETERIZATION  09/13/2015   Procedure: Right/Left Heart Cath and Coronary/Graft Angiography;  Surgeon: [name], MD;  Location: [facility] INVASIVE CV LAB;  Service: Cardiovascular;;   CARDIOVERSION N/A 05/16/2019   Procedure: CARDIOVERSION;  Surgeon: [name], MD;  Location: [facility] ENDOSCOPY;  Service: Cardiovascular;  Laterality: N/A;   CARDIOVERSION N/A 10/16/2022   Procedure: CARDIOVERSION;  Surgeon: Thurmon Fair, MD;  Location: [facility] INVASIVE CV LAB;  Service: Cardiovascular;  Laterality: N/A;   CARDIOVERSION N/A 12/16/2022   Procedure: CARDIOVERSION;  Surgeon: Little Ishikawa, MD;  Location: [facility] INVASIVE CV LAB;  Service: Cardiovascular;  Laterality: N/A;   Colonscopy     CORONARY ARTERY BYPASS GRAFT  08/03/2011   Procedure: CORONARY ARTERY BYPASS GRAFTING (CABG);  Surgeon: Alleen Borne, MD;  Location: [facility] OR;  Service: Open Heart Surgery;  Laterality: N/A;  CABG times three using right saphenous vein and left mammary artery usisng endoscope.   LEFT HEART CATH AND CORS/GRAFTS ANGIOGRAPHY  N/A 12/15/2021   Procedure: LEFT HEART CATH AND CORS/GRAFTS ANGIOGRAPHY;  Surgeon: Tonny Bollman, MD;  Location: [facility] INVASIVE CV LAB;  Service: Cardiovascular;  Laterality: N/A;   LEFT HEART CATHETERIZATION WITH CORONARY/GRAFT ANGIOGRAM N/A 09/20/2013   Procedure: LEFT HEART CATHETERIZATION WITH Isabel Caprice;  Surgeon: [name], MD;  Location: [facility] CATH LAB;  Service: Cardiovascular;  Laterality: N/A;   Lower Extremity Arterial Doppler  03/16/2013   bilat ABIs demonstated normal values; R runoff - posterior tibial & anterior tibial arteries occluded; L runoff - peroneal & posterior tibial arteries occluded, anterior tibial artery appears occluded   LUMBAR LAMINECTOMY/DECOMPRESSION MICRODISCECTOMY N/A 09/12/2014   Procedure: LUMBAR DECOMPRESSION L3-L4, L4-L5, MICRODISCECTOMY L4-L5;  Surgeon: Jene Every, MD;  Location: [facility] ORS;  Service: Orthopedics;  Laterality: N/A;   PERIPHERAL VASCULAR BALLOON ANGIOPLASTY  11/14/2020   Procedure: PERIPHERAL VASCULAR BALLOON ANGIOPLASTY;  Surgeon: Cephus Shelling, MD;  Location: [facility] INVASIVE CV LAB;  Service: Cardiovascular;;   PERIPHERAL VASCULAR BALLOON ANGIOPLASTY Left 01/02/2021   Procedure: PERIPHERAL VASCULAR BALLOON ANGIOPLASTY;  Surgeon: Chestine Spore,  Canary Brim, MD;  Location: [facility] INVASIVE CV LAB;  Service: Cardiovascular;  Laterality: Left;  Failed PTA   Pilonidal Cyst removed     TRANSTHORACIC ECHOCARDIOGRAM  11/10/2012   EF 40-45%, mild LVH, mild hypokinesis of anteroseptal myocardium, grade 1 diastolic dysfunction; mild MR & calcifed mitral annulus; LA mild-mod dilated; RA mildly dilated   Family History:  Family History  Problem Relation Age of Onset   Cancer Mother    Lung disease Father        & heart disease   Colon polyps Father    Colon cancer Sister    Sudden death Maternal Grandmother    Anesthesia problems Daughter    Social History:  Social History   Tobacco Use   Smoking status: Former    Types: Cigars    Quit  date: 09/07/2011    Years since quitting: 11.6   Smokeless tobacco: Former    Quit date: 02/28/2012   Tobacco comments:    Former smoker 02/16/23  Vaping Use   Vaping status: Never Used  Substance Use Topics   Alcohol use: Not Currently    Alcohol/week: 1.0 - 2.0 standard drink of alcohol    Types: 1 - 2 Cans of beer per week   Drug use: No    Home Medications:  Prior to Admission medications   Medication Sig Start Date End Date Taking? Authorizing Provider  acetaminophen (TYLENOL) 500 MG tablet Take 500-1,000 mg by mouth every 6 (six) hours as needed (for pain.).    [provider]  amiodarone (PACERONE) 200 MG tablet Take 2 tablets (400 mg total) TWICE a day for 2 weeks, then take 1 tablet (200 mg total) TWICE a day for 2 weeks, then take 1 tablet (200 mg total) ONCE a day thereafter Patient taking differently: Take 100 mg by mouth daily. Take 0.5 tablets (100 mg total) once daily. 10/27/22   Camnitz, Andree Coss, MD  apixaban (ELIQUIS) 5 MG TABS tablet Take 5 mg by mouth 2 (two) times daily.    [provider]  carvedilol (COREG) 3.125 MG tablet Take 1 tablet (3.125 mg total) by mouth 2 (two) times daily with a meal. 08/20/22   [name], Lisette Abu, MD  empagliflozin (JARDIANCE) 25 MG TABS tablet Take 1 tablet (25 mg total) by mouth daily. 08/20/22   [name], Lisette Abu, MD  furosemide (LASIX) 40 MG tablet Take 1 1/2 tablet by mouth in the morning and 1 tablet by mouth in the afternoon for 4 days then go back down to 1 tablet by mouth twice a day Patient taking differently: Take 40 mg by mouth daily. Take 1  tablet by mouth in the morning and 1 tablet by mouth in the afternoon as needed. 10/22/22   Duke, Roe Rutherford, PA  gabapentin (NEURONTIN) 300 MG capsule Take 600 mg by mouth at bedtime.    [provider]  metFORMIN (GLUCOPHAGE-XR) 500 MG 24 hr tablet Take 1,000 mg by mouth 2 (two) times daily. 07/05/15   [provider]  Na Sulfate-K Sulfate-Mg Sulf  17.5-3.13-1.6 GM/177ML SOLN As directed 03/15/23   Rourk, Gerrit Friends, MD  [facility] VERIO test strip 1 each daily. 08/16/19   [provider]  rosuvastatin (CRESTOR) 10 MG tablet Take 1 tablet (10 mg total) by mouth every other day. 10/22/22   Marcelino Duster, PA    Inpatient Medications:  Current Facility-Administered Medications:    0.9 %  sodium chloride infusion (Manually program via Guardrails IV Fluids), ,  Intravenous, Once, Amponsah, Flossie Buffy, MD   acetaminophen (TYLENOL) tablet 650 mg, 650 mg, Oral, Q6H PRN **OR** acetaminophen (TYLENOL) suppository 650 mg, 650 mg, Rectal, Q6H PRN, Kirke Corin, Flossie Buffy, MD   amiodarone (PACERONE) tablet 100 mg, 100 mg, Oral, QHS, Steffanie Rainwater, MD, 100 mg at 04/17/23 2202   empagliflozin (JARDIANCE) tablet 25 mg, 25 mg, Oral, Daily, Amponsah, Flossie Buffy, MD   gabapentin (NEURONTIN) capsule 600 mg, 600 mg, Oral, QHS, Steffanie Rainwater, MD, 600 mg at 04/17/23 2128   insulin aspart (novoLOG) injection 0-15 Units, 0-15 Units, Subcutaneous, Q4H, Steffanie Rainwater, MD, 3 Units at 04/18/23 0507   metFORMIN (GLUCOPHAGE-XR) 24 hr tablet 1,000 mg, 1,000 mg, Oral, BID WC, Amponsah, Flossie Buffy, MD   ondansetron (ZOFRAN) tablet 4 mg, 4 mg, Oral, Q6H PRN **OR** ondansetron (ZOFRAN) injection 4 mg, 4 mg, Intravenous, Q6H PRN, Amponsah, Flossie Buffy, MD   senna-docusate (Senokot-S) tablet 1 tablet, 1 tablet, Oral, QHS PRN, Kirke Corin, Flossie Buffy, MD Allergies: Entresto [sacubitril-valsartan] and Sacubitril  Complete Review of Systems: GENERAL: negative for malaise, night sweats HEENT: No changes in hearing or vision, no nose bleeds or other nasal problems. NECK: Negative for lumps, goiter, pain and significant neck swelling RESPIRATORY: Negative for cough, wheezing CARDIOVASCULAR: Negative for chest pain, leg swelling, palpitations, orthopnea GI: SEE HPI MUSCULOSKELETAL: Negative for joint pain or swelling, back pain, and muscle pain. SKIN: Negative for  lesions, rash PSYCH: Negative for sleep disturbance, mood disorder and recent psychosocial stressors. HEMATOLOGY Negative for prolonged bleeding, bruising easily, and swollen nodes. ENDOCRINE: Negative for cold or heat intolerance, polyuria, polydipsia and goiter. NEURO: negative for tremor, gait imbalance, syncope and seizures. The remainder of the review of systems is noncontributory.  Physical Exam: BP (!) 110/42   Pulse (!) 55   Temp (!) 97 F (36.1 C) (Oral)   Resp 18   Ht 6\' 2"  (1.88 m)   Wt 93.9 kg   SpO2 98%   BMI 26.58 kg/m  GENERAL: The patient is AO x3, in no acute distress. HEENT: Head is normocephalic and atraumatic. EOMI are intact. Mouth is well hydrated and without lesions. NECK: Supple. No masses LUNGS: Clear to auscultation. No presence of rhonchi/wheezing/rales. Adequate chest expansion HEART: RRR, normal s1 and s2. ABDOMEN: Soft, nontender, no guarding, no peritoneal signs, and nondistended. BS +. No masses. RECTAL EXAM:deferred EXTREMITIES: Without any cyanosis, clubbing, rash, lesions or edema. NEUROLOGIC: AOx3, no focal motor deficit. SKIN: no jaundice, no rashes  Laboratory Data CBC:     Component Value Date/Time   WBC 6.5 04/18/2023 0310   RBC 2.88 (L) 04/18/2023 0310   HGB 8.3 (L) 04/18/2023 0310   HGB 12.1 (L) 01/04/2023 0916   HCT 24.8 (L) 04/18/2023 0310   HCT 38.3 01/04/2023 0916   PLT 145 (L) 04/18/2023 0310   PLT 232 01/04/2023 0916   MCV 86.1 04/18/2023 0310   MCV 87 01/04/2023 0916   MCH 28.8 04/18/2023 0310   MCHC 33.5 04/18/2023 0310   RDW 15.2 04/18/2023 0310   RDW 15.5 (H) 01/04/2023 0916   LYMPHSABS 1.5 04/09/2023 1024   LYMPHSABS 1.8 12/08/2021 1416   MONOABS 0.6 04/09/2023 1024   EOSABS 0.2 04/09/2023 1024   EOSABS 0.2 12/08/2021 1416   BASOSABS 0.0 04/09/2023 1024   BASOSABS 0.1 12/08/2021 1416   COAG:  Lab Results  Component Value Date   INR 1.5 (H) 11/10/2022   INR 1.8 (H) 11/09/2022   INR 1.14 09/12/2015     BMP:  Latest Ref Rng & Units 04/17/2023    2:54 PM 04/09/2023   10:24 AM 03/04/2023    2:31 PM  BMP  Glucose 70 - 99 mg/dL 629  528    BUN 8 - 23 mg/dL 20  22    Creatinine 4.13 - 1.24 mg/dL 2.44  0.10  2.72   Sodium 135 - 145 mmol/L 136  136    Potassium 3.5 - 5.1 mmol/L 3.7  3.5    Chloride 98 - 111 mmol/L 102  100    CO2 22 - 32 mmol/L 26  24    Calcium 8.9 - 10.3 mg/dL 8.4  8.6      HEPATIC:     Latest Ref Rng & Units 04/17/2023    2:54 PM 11/17/2021    4:22 PM 12/19/2017   10:55 PM  Hepatic Function  Total Protein 6.5 - 8.1 g/dL 6.4  7.1  7.4   Albumin 3.5 - 5.0 g/dL 3.4  4.0  3.8   AST 15 - 41 U/L 14  13  16    ALT 0 - 44 U/L 22  13  17    Alk Phosphatase 38 - 126 U/L 100  147  66   Total Bilirubin 0.3 - 1.2 mg/dL 1.1  1.2  0.8     CARDIAC:  Lab Results  Component Value Date   CKTOTAL 183 06/03/2011   CKMB 2.3 06/03/2011   TROPONINI <0.03 08/20/2015     Imaging: I personally reviewed and interpreted the available imaging.  Assessment & Plan: 77 y.o. male with history of coronary artery disease, CHF status post ICD placement, atrial fibrillation, BPH, hyperlipidemia, diabetes, hypertension, sleep apnea and stroke, who was admitted to the hospital after presenting large rectal bleeding.  The patient underwent recent colonoscopy with polypectomy on 04/14/2023.  Patient is presenting postpolypectomy bleeding.  Likely source of bleeding is located at the rectosigmoid area as he had a large polyp endoscopic mucosal resection required hot snare resection and clipping x 2.  I was reached yesterday about this case and we discussed with emergency team over the phone the possibility of performing colonoscopy.  Patient was agreeable and drank bowel prep today.  -Proceed with colonoscopy today  Katrinka Blazing, MD Gastroenterology and Hepatology [facility]

## 2023-04-18 NOTE — Transfer of Care (Signed)
Immediate Anesthesia Transfer of Care Note  Patient: [name] [name]  Procedure(s) Performed: COLONOSCOPY WITH PROPOFOL  Patient Location: PACU  Anesthesia Type:General  Level of Consciousness: awake and drowsy  Airway & Oxygen Therapy: Patient Spontanous Breathing  Post-op Assessment: Report given to RN and Post -op Vital signs reviewed and stable  Post vital signs: Reviewed and stable  Last Vitals:  Vitals Value Taken Time  BP 95.48   Temp 98.3   Pulse 54   Resp 16   SpO2 94     Last Pain:  Vitals:   04/18/23 0906  TempSrc: Oral  PainSc:          Complications: No notable events documented.

## 2023-04-19 ENCOUNTER — Telehealth: Payer: Self-pay | Admitting: Gastroenterology

## 2023-04-19 ENCOUNTER — Ambulatory Visit (INDEPENDENT_AMBULATORY_CARE_PROVIDER_SITE_OTHER): Payer: Medicare Other | Admitting: Audiology

## 2023-04-19 ENCOUNTER — Ambulatory Visit (INDEPENDENT_AMBULATORY_CARE_PROVIDER_SITE_OTHER): Payer: Medicare Other

## 2023-04-19 ENCOUNTER — Telehealth: Payer: Self-pay

## 2023-04-19 DIAGNOSIS — E1169 Type 2 diabetes mellitus with other specified complication: Secondary | ICD-10-CM | POA: Diagnosis not present

## 2023-04-19 DIAGNOSIS — K91840 Postprocedural hemorrhage and hematoma of a digestive system organ or structure following a digestive system procedure: Secondary | ICD-10-CM | POA: Diagnosis not present

## 2023-04-19 DIAGNOSIS — K922 Gastrointestinal hemorrhage, unspecified: Secondary | ICD-10-CM | POA: Diagnosis not present

## 2023-04-19 DIAGNOSIS — I5042 Chronic combined systolic (congestive) and diastolic (congestive) heart failure: Secondary | ICD-10-CM | POA: Diagnosis not present

## 2023-04-19 DIAGNOSIS — I10 Essential (primary) hypertension: Secondary | ICD-10-CM | POA: Diagnosis not present

## 2023-04-19 LAB — GLUCOSE, CAPILLARY
Glucose-Capillary: 123 mg/dL — ABNORMAL HIGH (ref 70–99)
Glucose-Capillary: 142 mg/dL — ABNORMAL HIGH (ref 70–99)
Glucose-Capillary: 145 mg/dL — ABNORMAL HIGH (ref 70–99)
Glucose-Capillary: 146 mg/dL — ABNORMAL HIGH (ref 70–99)
Glucose-Capillary: 162 mg/dL — ABNORMAL HIGH (ref 70–99)
Glucose-Capillary: 164 mg/dL — ABNORMAL HIGH (ref 70–99)
Glucose-Capillary: 207 mg/dL — ABNORMAL HIGH (ref 70–99)

## 2023-04-19 LAB — HEMOGLOBIN AND HEMATOCRIT, BLOOD
HCT: 28.7 % — ABNORMAL LOW (ref 39.0–52.0)
HCT: 28.4 % — ABNORMAL LOW (ref 39.0–52.0)
Hemoglobin: 9.6 g/dL — ABNORMAL LOW (ref 13.0–17.0)
Hemoglobin: 9.5 g/dL — ABNORMAL LOW (ref 13.0–17.0)

## 2023-04-19 LAB — SURGICAL PATHOLOGY

## 2023-04-19 MED ORDER — SODIUM CHLORIDE 0.9 % IV BOLUS
500.0000 mL | Freq: Once | INTRAVENOUS | Status: AC
  Administered 2023-04-19: 500 mL via INTRAVENOUS

## 2023-04-19 MED ORDER — FLUCONAZOLE 100 MG PO TABS: 100.0000 mg | ORAL_TABLET | Freq: Every day | ORAL | 0 refills | Status: AC

## 2023-04-19 MED ORDER — SENNOSIDES-DOCUSATE SODIUM 8.6-50 MG PO TABS: 1.0000 | ORAL_TABLET | Freq: Every evening | ORAL | Status: DC | PRN

## 2023-04-19 MED ORDER — APIXABAN 5 MG PO TABS: 5.0000 mg | ORAL_TABLET | Freq: Two times a day (BID) | ORAL | Status: AC

## 2023-04-19 NOTE — Plan of Care (Signed)
Rested well during the night. No bloody stools during the night. No c/o abdominal pain. 2240 hg of 9.0.   Problem: Education: Goal: Knowledge of General Education information will improve Description: Including pain rating scale, medication(s)/side effects and non-pharmacologic comfort measures Outcome: Progressing   Problem: Health Behavior/Discharge Planning: Goal: Ability to manage health-related needs will improve Outcome: Progressing   Problem: Clinical Measurements: Goal: Ability to maintain clinical measurements within normal limits will improve Outcome: Progressing Goal: Will remain free from infection Outcome: Progressing Goal: Diagnostic test results will improve Outcome: Progressing Goal: Respiratory complications will improve Outcome: Progressing Goal: Cardiovascular complication will be avoided Outcome: Progressing   Problem: Activity: Goal: Risk for activity intolerance will decrease Outcome: Progressing   Problem: Nutrition: Goal: Adequate nutrition will be maintained Outcome: Progressing   Problem: Coping: Goal: Level of anxiety will decrease Outcome: Progressing   Problem: Elimination: Goal: Will not experience complications related to bowel motility Outcome: Progressing Goal: Will not experience complications related to urinary retention Outcome: Progressing   Problem: Pain Managment: Goal: General experience of comfort will improve Outcome: Progressing   Problem: Safety: Goal: Ability to remain free from injury will improve Outcome: Progressing   Problem: Skin Integrity: Goal: Risk for impaired skin integrity will decrease Outcome: Progressing   Problem: Education: Goal: Ability to describe self-care measures that may prevent or decrease complications (Diabetes Survival Skills Education) will improve Outcome: Progressing Goal: Individualized Educational Video(s) Outcome: Progressing   Problem: Coping: Goal: Ability to adjust to condition  or change in health will improve Outcome: Progressing   Problem: Fluid Volume: Goal: Ability to maintain a balanced intake and output will improve Outcome: Progressing   Problem: Health Behavior/Discharge Planning: Goal: Ability to identify and utilize available resources and services will improve Outcome: Progressing Goal: Ability to manage health-related needs will improve Outcome: Progressing   Problem: Metabolic: Goal: Ability to maintain appropriate glucose levels will improve Outcome: Progressing   Problem: Nutritional: Goal: Maintenance of adequate nutrition will improve Outcome: Progressing Goal: Progress toward achieving an optimal weight will improve Outcome: Progressing   Problem: Skin Integrity: Goal: Risk for impaired skin integrity will decrease Outcome: Progressing   Problem: Tissue Perfusion: Goal: Adequacy of tissue perfusion will improve Outcome: Progressing

## 2023-04-19 NOTE — Plan of Care (Signed)
  Problem: Education: Goal: Knowledge of General Education information will improve Description Including pain rating scale, medication(s)/side effects and non-pharmacologic comfort measures Outcome: Progressing   

## 2023-04-19 NOTE — Progress Notes (Addendum)
Nurse with patient ambulating in hallway times one assist using front wheel walker. Patient became dizzy,and light headed,patient became weak, I held him up against the wall to prevent him from falling, other staff was able to secure a wheel chair for patient. B/P 85/50,HR 63, room air sat 97 percent. Family was also accompanying [place] while in hallway. Patient escorted back to room. Dr Blake Divine notified. Plan of care on going.

## 2023-04-19 NOTE — Progress Notes (Cosign Needed Addendum)
Gastroenterology Progress Note     Patient ID: [name] [name]; [id number]; Dec 19, 1945    Subjective   No further rectal bleeding. No abdominal pain, N/V. Multiple questions by patient and wife answered regarding polyps and procedure completed.    Objective   Vital signs in last 24 hours Temp:  [97.8 F (36.6 C)-98.3 F (36.8 C)] 97.8 F (36.6 C) (10/21 0434) Pulse Rate:  [54-60] 59 (10/21 0434) Resp:  [11-18] 18 (10/21 0434) BP: (95-115)/(48-60) 115/60 (10/21 0434) SpO2:  [96 %-100 %] 98 % (10/21 0434) Last BM Date : 04/18/23  Physical Exam General:   Alert and oriented, pleasant Head:  Normocephalic and atraumatic. Abdomen:  Bowel sounds present, soft, non-tender, non-distended. No HSM or hernias noted. No rebound or guarding. No masses appreciated  Psych:  Alert and cooperative. Normal mood and affect.  Intake/Output from previous day: 10/20 0701 - 10/21 0700 In: 795 [P.O.:480; Blood:315] Out: 400 [Urine:400] Intake/Output this shift: No intake/output data recorded.  Lab Results  Recent Labs    04/17/23 1454 04/17/23 2210 04/18/23 0310 04/18/23 1112 04/18/23 2226  WBC 7.8  --  6.5  --   --   HGB 10.6*   < > 8.3* 9.4* 9.0*  HCT 32.3*   < > 24.8* 28.5* 27.4*  PLT 186  --  145*  --   --    < > = values in this interval not displayed.   BMET Recent Labs    04/17/23 1454  NA 136  K 3.7  CL 102  CO2 26  GLUCOSE 301*  BUN 20  CREATININE 0.92  CALCIUM 8.4*   LFT Recent Labs    04/17/23 1454  PROT 6.4*  ALBUMIN 3.4*  AST 14*  ALT 22  ALKPHOS 100  BILITOT 1.1      Assessment  77 y.o. male with a history of CAD, CHF, ICD placement, afib, BPH, hyperlipidemia, diabetes, hypertension, sleep apnea and stroke, admitted with post-polypectomy bleed in setting of Eliquis and underwent colonoscopy yesterday for bleeding control.   Admitting Hgb 10.6, with low of 9 yesterday evening. We have requested additional H/H this morning. Rectal bleeding has  ceased s/p colonoscopy for bleeding control (clip placement).   Will advance to soft diet. He is appropriate for discharge home today if tolerating diet. Will arrange outpatient follow-up with Ermalinda Memos, PA-C, who last saw him as outpatient for positive Cologuard, early satiety, weight loss.    Plan / Recommendations  Holding Eliquis total of 7 days (resume on Sunday) Complete 3 week course of Diflucan due to KOH positive (EGD last week) Advance to soft diet H/H Final path from initial colonoscopy pending Will arrange outpatient follow-up    LOS: 2 days    04/19/2023, 8:41 AM  Gelene Mink, PhD, ANP-BC [facility]

## 2023-04-19 NOTE — Telephone Encounter (Signed)
Mandy: patient needs outpatient follow-up with Ermalinda Memos, PA-C in about 2 weeks.

## 2023-04-19 NOTE — Telephone Encounter (Signed)
Pathologist called me this evening about this patient.  Piecemeal specimens from rectosigmoid polypectomy were reviewed.  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  I called the patient and informed him of the diagnosis this evening.  This lesion was tattooed and prophylactically clipped.  Eliquis was started a day later.  He noted rectal bleeding and presented over the weekend.  2 g drop in his hemoglobin.  He did not require transfusion.  Dr. Levon Hedger did another colonoscopy and saw some clot in a couple of places and placed additional clips to ensure hemostasis.  He was also recently found to have Candida esophagitis and is on Diflucan.  I recommend he see Dr. [name] down in [place].  His cardiologist are down there.  He is a high risk patient largely due to cardiac comorbidities.  They will need to come up with a game plan for surgical resection.  He was instructed to hold Eliquis 7 days from today and then resume.  Will make the referral to Dr. [name].  Send a copy of this note to his PCP Dr. Fara Chute at [facility] in [facility]

## 2023-04-19 NOTE — Telephone Encounter (Signed)
Returned call to patient as requested by voice mail message.  He requested to reschedule 10/22 remote transmission to 10/24.  Advised will be fine.

## 2023-04-19 NOTE — Care Management Important Message (Signed)
Important Message  Patient Details  Name: [name] [name] MRN: [id number] Date of Birth: 10-28-1945   Important Message Given:  N/A - LOS <3 / Initial given by admissions     Corey Harold 04/19/2023, 3:05 PM

## 2023-04-19 NOTE — Progress Notes (Signed)
H/H 9.6. GI signing off. Appropriate for discharge.

## 2023-04-19 NOTE — Progress Notes (Signed)
[facility]                                                                               [name], is a 77 y.o. male, DOB - 1945-09-07, NWG:[id number] Admit date - 04/17/2023    Outpatient Primary MD for the patient is [name], [name], MD  LOS - 2  days    Brief summary    [name] [name] is a 77 y.o. male with medical history significant of A-fib on Eliquis, CAD, T2DM, HTN and CHF who presents to the ED for evaluation of rectal bleeding.  Patient reports that he underwent colonoscopy 3 days ago was found to have multiple scattered diverticula as well as 3 polyps that were clipped with cold snare.  He was having normal soft bowel movements until yesterday when he noticed bright red blood from his rectum during bowel movements.  On arrival to ED, his BP were soft, and his stool was positive for blood. His hemoglobin at baseline around 11 dropped to 8.3. 1 unit of prbc transfusion given and his hemoglobin improved to 9.4.  Gi consulted and he underwent repeat colonoscopy, showing A single (solitary) postpolypectomy ulcer in the cecum. Clips were placed. Two clips in the recto-sigmoid colon at site of previous polypectomy. Two more clips placed at polypectomy site to prevent recurrent bleeding,. A single (solitary) postpolypectomy ulcer in the ascending colon .  He was started on clears and eliquis held. His diet advanced to soft and  he was scheduled for discharge.  Earlier today when we tried to walk him, he became dizzy and his BP was around 88/50 mmhg.  He was ordered fluid bolus .     Assessment & Plan    Assessment and Plan:  Lower GI bleed/ post polypectomy bleeding.  S/p colonoscopy today showing A single (solitary) postpolypectomy ulcer in the cecum. Clips were placed. Two clips in the recto-sigmoid colon at site of previous polypectomy. Two more clips placed at polypectomy site to prevent recurrent bleeding,. A single (solitary) postpolypectomy ulcer in the  ascending colon .  He was started on clears , advanced as tolerated. and eliquis held.  Monitor H&h every 12 hours.  Hemoglobin staying stable around 9.    Atrial fibrillation Rate controlled with amiodarone. Eliquis is to be held for 7 days.  Plan to restart eliquis on Monday.   Type 2 DM CBG (last 3)  Recent Labs    04/19/23 0433 04/19/23 0727 04/19/23 1109  GLUCAP 123* 162* 207*   Resume SSI. Well controlled. Non insulin dependent.  Hold metformin.    Hypertension:  Continue to hold lasix, coreg, .  BP dropped earlier today when walking.  Will check for orthostatic vital signs.  500 ml bolus given.    Chronic systolic and diastolic CHF.  He appears compensated.  Last echo shows LVEF of 30 to 35%, with grade 3 diastolic dysfunction on 01/2023. Lasix and coreg held.    Mild thrombocytopenia Platelet count of 145,000.    Estimated body mass index is 26.58 kg/m as calculated from the following:   Height as of this encounter: 6\' 2"  (1.88 m).   Weight as  of this encounter: 93.9 kg.  Code Status: full code.  DVT Prophylaxis:  SCDs Start: 04/17/23 1748   Level of Care: Level of care: Telemetry Family Communication: Updated patient's family at bedside.   Disposition Plan:     Remains inpatient appropriate: monitor hemoglobin  Procedures:  Colonoscopy.  Consultants:   gastroenterology  Antimicrobials:   Anti-infectives (From admission, onward)    Start     Dose/Rate Route Frequency Ordered Stop   04/20/23 0000  fluconazole (DIFLUCAN) 100 MG tablet        100 mg Oral Daily 04/19/23 1353 05/10/23 2359   04/19/23 1000  fluconazole (DIFLUCAN) tablet 100 mg        100 mg Oral Daily 04/18/23 1032 05/09/23 0959   04/18/23 1045  fluconazole (DIFLUCAN) tablet 200 mg        200 mg Oral  Once 04/18/23 1032 04/18/23 1110        Medications  Scheduled Meds:  sodium chloride   Intravenous Once   amiodarone  100 mg Oral QHS   empagliflozin  25 mg Oral Daily    fluconazole  100 mg Oral Daily   gabapentin  600 mg Oral QHS   insulin aspart  0-15 Units Subcutaneous TID WC   metFORMIN  1,000 mg Oral BID WC   Continuous Infusions: PRN Meds:.acetaminophen **OR** acetaminophen, ondansetron **OR** ondansetron (ZOFRAN) IV, senna-docusate    Subjective:   [name] was seen and examined today.  Dizziness when walking. No chest pain or sob. Wants to go home.   Objective:   Vitals:   04/19/23 0905 04/19/23 1434 04/19/23 1450 04/19/23 1615  BP: (!) 123/56 (!) 85/50 113/63 (!) 103/57  Pulse: 66 63 (!) 54 (!) 53  Resp:   18   Temp: 98 F (36.7 C)  98.7 F (37.1 C)   TempSrc: Oral  Oral   SpO2: 100% 97% 98%   Weight:      Height:        Intake/Output Summary (Last 24 hours) at 04/19/2023 1641 Last data filed at 04/19/2023 1632 Gross per 24 hour  Intake 747.55 ml  Output 600 ml  Net 147.55 ml   Filed Weights   04/17/23 1438 04/17/23 1848  Weight: 93.4 kg 93.9 kg     Exam General exam: Appears calm and comfortable  Respiratory system: Clear to auscultation. Respiratory effort normal. Cardiovascular system: S1 & S2 heard, RRR. No JVD,  Gastrointestinal system: Abdomen is nondistended, soft and nontender.  Central nervous system: Alert and oriented. Extremities: Symmetric 5 x 5 power. Skin: No rashes, Psychiatry:  Mood & affect appropriate.    Data Reviewed:  I have personally reviewed following labs and imaging studies   CBC Lab Results  Component Value Date   WBC 6.5 04/18/2023   RBC 2.88 (L) 04/18/2023   HGB 9.6 (L) 04/19/2023   HCT 28.7 (L) 04/19/2023   MCV 86.1 04/18/2023   MCH 28.8 04/18/2023   PLT 145 (L) 04/18/2023   MCHC 33.5 04/18/2023   RDW 15.2 04/18/2023   LYMPHSABS 1.5 04/09/2023   MONOABS 0.6 04/09/2023   EOSABS 0.2 04/09/2023   BASOSABS 0.0 04/09/2023     Last metabolic panel Lab Results  Component Value Date   NA 136 04/17/2023   K 3.7 04/17/2023   CL 102 04/17/2023   CO2 26 04/17/2023   BUN  20 04/17/2023   CREATININE 0.92 04/17/2023   GLUCOSE 301 (H) 04/17/2023   GFRNONAA >60 04/17/2023   GFRAA 80 01/22/2020  CALCIUM 8.4 (L) 04/17/2023   PROT 6.4 (L) 04/17/2023   ALBUMIN 3.4 (L) 04/17/2023   LABGLOB 3.1 11/17/2021   AGRATIO 1.3 11/17/2021   BILITOT 1.1 04/17/2023   ALKPHOS 100 04/17/2023   AST 14 (L) 04/17/2023   ALT 22 04/17/2023   ANIONGAP 8 04/17/2023    CBG (last 3)  Recent Labs    04/19/23 0433 04/19/23 0727 04/19/23 1109  GLUCAP 123* 162* 207*      Coagulation Profile: No results for input(s): "INR", "PROTIME" in the last 168 hours.   Radiology Studies: No results found.     Kathlen Mody M.D. [facility] 04/19/2023, 4:41 PM  Available via Epic secure chat 7am-7pm After 7 pm, please refer to night coverage provider listed on amion.

## 2023-04-20 ENCOUNTER — Other Ambulatory Visit: Payer: Self-pay | Admitting: *Deleted

## 2023-04-20 ENCOUNTER — Encounter (HOSPITAL_COMMUNITY): Payer: Medicare Other

## 2023-04-20 ENCOUNTER — Inpatient Hospital Stay (HOSPITAL_COMMUNITY): Payer: Medicare Other

## 2023-04-20 DIAGNOSIS — C801 Malignant (primary) neoplasm, unspecified: Secondary | ICD-10-CM

## 2023-04-20 DIAGNOSIS — I10 Essential (primary) hypertension: Secondary | ICD-10-CM | POA: Diagnosis not present

## 2023-04-20 DIAGNOSIS — K625 Hemorrhage of anus and rectum: Secondary | ICD-10-CM | POA: Diagnosis not present

## 2023-04-20 DIAGNOSIS — C189 Malignant neoplasm of colon, unspecified: Secondary | ICD-10-CM | POA: Diagnosis not present

## 2023-04-20 DIAGNOSIS — I5042 Chronic combined systolic (congestive) and diastolic (congestive) heart failure: Secondary | ICD-10-CM | POA: Diagnosis not present

## 2023-04-20 DIAGNOSIS — E1169 Type 2 diabetes mellitus with other specified complication: Secondary | ICD-10-CM | POA: Diagnosis not present

## 2023-04-20 LAB — CBC WITH DIFFERENTIAL/PLATELET
Abs Immature Granulocytes: 0.09 10*3/uL — ABNORMAL HIGH (ref 0.00–0.07)
Basophils Absolute: 0 10*3/uL (ref 0.0–0.1)
Basophils Relative: 0 %
Eosinophils Absolute: 0.2 10*3/uL (ref 0.0–0.5)
Eosinophils Relative: 3 %
HCT: 29 % — ABNORMAL LOW (ref 39.0–52.0)
Hemoglobin: 9.6 g/dL — ABNORMAL LOW (ref 13.0–17.0)
Immature Granulocytes: 1 %
Lymphocytes Relative: 15 %
Lymphs Abs: 1.2 10*3/uL (ref 0.7–4.0)
MCH: 29 pg (ref 26.0–34.0)
MCHC: 33.1 g/dL (ref 30.0–36.0)
MCV: 87.6 fL (ref 80.0–100.0)
Monocytes Absolute: 0.7 10*3/uL (ref 0.1–1.0)
Monocytes Relative: 8 %
Neutro Abs: 5.8 10*3/uL (ref 1.7–7.7)
Neutrophils Relative %: 73 %
Platelets: 175 10*3/uL (ref 150–400)
RBC: 3.31 MIL/uL — ABNORMAL LOW (ref 4.22–5.81)
RDW: 16.1 % — ABNORMAL HIGH (ref 11.5–15.5)
WBC: 8.1 10*3/uL (ref 4.0–10.5)
nRBC: 0 % (ref 0.0–0.2)

## 2023-04-20 LAB — GLUCOSE, CAPILLARY
Glucose-Capillary: 139 mg/dL — ABNORMAL HIGH (ref 70–99)
Glucose-Capillary: 166 mg/dL — ABNORMAL HIGH (ref 70–99)
Glucose-Capillary: 166 mg/dL — ABNORMAL HIGH (ref 70–99)
Glucose-Capillary: 168 mg/dL — ABNORMAL HIGH (ref 70–99)
Glucose-Capillary: 178 mg/dL — ABNORMAL HIGH (ref 70–99)
Glucose-Capillary: 181 mg/dL — ABNORMAL HIGH (ref 70–99)
Glucose-Capillary: 360 mg/dL — ABNORMAL HIGH (ref 70–99)

## 2023-04-20 LAB — BASIC METABOLIC PANEL
Anion gap: 9 (ref 5–15)
BUN: 12 mg/dL (ref 8–23)
CO2: 20 mmol/L — ABNORMAL LOW (ref 22–32)
Calcium: 7.7 mg/dL — ABNORMAL LOW (ref 8.9–10.3)
Chloride: 105 mmol/L (ref 98–111)
Creatinine, Ser: 1.07 mg/dL (ref 0.61–1.24)
GFR, Estimated: >60 mL/min (ref >60)
Glucose, Bld: 176 mg/dL — ABNORMAL HIGH (ref 70–99)
Potassium: 3.5 mmol/L (ref 3.5–5.1)
Sodium: 134 mmol/L — ABNORMAL LOW (ref 135–145)

## 2023-04-20 MED ORDER — TRAZODONE HCL 50 MG PO TABS
50.0000 mg | ORAL_TABLET | Freq: Every evening | ORAL | Status: DC | PRN
  Administered 2023-04-20: 50 mg via ORAL
  Filled 2023-04-20: qty 1

## 2023-04-20 MED ORDER — MIDODRINE HCL 5 MG PO TABS
5.0000 mg | ORAL_TABLET | Freq: Three times a day (TID) | ORAL | Status: DC
  Administered 2023-04-20 – 2023-04-21 (×3): 5 mg via ORAL
  Filled 2023-04-20 (×3): qty 1

## 2023-04-20 MED ORDER — IOHEXOL 300 MG/ML  SOLN
100.0000 mL | Freq: Once | INTRAMUSCULAR | Status: AC | PRN
  Administered 2023-04-20: 100 mL via INTRAVENOUS

## 2023-04-20 MED ORDER — SODIUM CHLORIDE 0.9 % IV BOLUS
500.0000 mL | Freq: Once | INTRAVENOUS | Status: AC
  Administered 2023-04-20: 500 mL via INTRAVENOUS

## 2023-04-20 MED ORDER — HEPARIN SOD (PORK) LOCK FLUSH 100 UNIT/ML IV SOLN: 500.0000 [IU] | INTRAVENOUS | Status: DC | PRN

## 2023-04-20 MED ORDER — ALUM & MAG HYDROXIDE-SIMETH 200-200-20 MG/5ML PO SUSP
15.0000 mL | Freq: Four times a day (QID) | ORAL | Status: DC | PRN
  Administered 2023-04-20: 15 mL via ORAL
  Filled 2023-04-20: qty 30

## 2023-04-20 MED ORDER — POLYETHYLENE GLYCOL 3350 17 G PO PACK
17.0000 g | PACK | Freq: Every day | ORAL | Status: DC
  Administered 2023-04-20 – 2023-04-21 (×2): 17 g via ORAL
  Filled 2023-04-20 (×2): qty 1

## 2023-04-20 MED ORDER — PANTOPRAZOLE SODIUM 40 MG PO TBEC
40.0000 mg | DELAYED_RELEASE_TABLET | Freq: Every day | ORAL | Status: DC
  Administered 2023-04-20: 40 mg via ORAL
  Filled 2023-04-20: qty 1

## 2023-04-20 NOTE — Progress Notes (Signed)
Mobility Specialist Progress Note:    04/20/23 1200  Mobility  Activity Stood at bedside;Dangled on edge of bed  Level of Assistance Minimal assist, patient does 75% or more  Assistive Device None  Range of Motion/Exercises Active;All extremities  Activity Response Tolerated fair  Mobility Referral Yes  $Mobility charge 1 Mobility  Mobility Specialist Start Time (ACUTE ONLY) 1200  Mobility Specialist Stop Time (ACUTE ONLY) 1215  Mobility Specialist Time Calculation (min) (ACUTE ONLY) 15 min   Pt received in bed, wife in room. Agreeable to mobility, required MinA to stand at bedside with no AD. Tolerated fair, see orthostatics below. Pt c/o weakness and unsteady. Returned pt supine, all needs met.   Supine: 106/59 (73) Sitting: 88/51 (63) Standing EOB: 91/47 (60) Standing at 3 minutes: 86/54 (65)  [name] [name] Mobility Specialist Please contact via Special educational needs teacher or  Rehab office at [phone]

## 2023-04-20 NOTE — Progress Notes (Signed)
[facility]                                                                               [name] [name], is a 77 y.o. male, DOB - 04-Jun-1946, KZS:[id number] Admit date - 04/17/2023    Outpatient Primary MD for the patient is [name], [name], MD  LOS - 3  days    Brief summary    [name] [name] is a 77 y.o. male with medical history significant of A-fib on Eliquis, CAD, T2DM, HTN and CHF who presents to the ED for evaluation of rectal bleeding.  Patient reports that he underwent colonoscopy 3 days ago was found to have multiple scattered diverticula as well as 3 polyps that were clipped with cold snare.  He was having normal soft bowel movements until yesterday when he noticed bright red blood from his rectum during bowel movements.  On arrival to ED, his BP were soft, and his stool was positive for blood. His hemoglobin at baseline around 11 dropped to 8.3. 1 unit of prbc transfusion given and his hemoglobin improved to 9.4.  Gi consulted and he underwent repeat colonoscopy, showing A single (solitary) postpolypectomy ulcer in the cecum. Clips were placed. Two clips in the recto-sigmoid colon at site of previous polypectomy. Two more clips placed at polypectomy site to prevent recurrent bleeding,. A single (solitary) postpolypectomy ulcer in the ascending colon .  He was started on clears and eliquis held. His diet advanced to soft and  he was scheduled for discharge.  Earlier today when we tried to walk him, he became dizzy and his BP was around 88/50 mmhg.  He was ordered fluid bolus .     Assessment & Plan    Assessment and Plan:  Lower GI bleed/ post polypectomy bleeding.  S/p colonoscopy today showing A single (solitary) postpolypectomy ulcer in the cecum. Clips were placed. Two clips in the recto-sigmoid colon at site of previous polypectomy. Two more clips placed at polypectomy site to prevent recurrent bleeding,. A single (solitary) postpolypectomy ulcer in the  ascending colon .  He was started on clears , advanced as tolerated. and eliquis held. GI recommends holding eliquis till Sunday.  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  Referral sent to Dr [name] by Dr [name].  Meanwhile oncology consult requested from Dr [name].  CT abd and pelvis with contrast ordered for evaluation of mets.  Hemoglobin staying stable around 9.    Atrial fibrillation Rate controlled with amiodarone. Eliquis is to be held for 7 days.  Plan to restart eliquis on Monday.   Type 2 DM CBG (last 3)  Recent Labs    04/20/23 0449 04/20/23 0733 04/20/23 1128  GLUCAP 181* 178* 168*   Resume SSI. Well controlled. Non insulin dependent.     Orthostatic hypotension Positive orthostatics , small fluid bolus ordered and midodrine started.  Continue to hold lasix, coreg, .     Chronic systolic and diastolic CHF.  He appears compensated.  Last echo shows LVEF of 30 to 35%, with grade 3 diastolic dysfunction on 01/2023. Lasix and coreg held.    Mild thrombocytopenia Platelet count of 145,000.  Constipation;  Started on miralax and senna/ colace.    Estimated body mass index is 26.58 kg/m as calculated from the following:   Height as of this encounter: 6\' 2"  (1.88 m).   Weight as of this encounter: 93.9 kg.  Code Status: full code.  DVT Prophylaxis:  SCDs Start: 04/17/23 1748   Level of Care: Level of care: Med-Surg Family Communication: Updated patient's family at bedside.   Disposition Plan:     Remains inpatient appropriate: monitor hemoglobin  Procedures:  Colonoscopy.  Consultants:   gastroenterology  Antimicrobials:   Anti-infectives (From admission, onward)    Start     Dose/Rate Route Frequency Ordered Stop   04/20/23 0000  fluconazole (DIFLUCAN) 100 MG tablet        100 mg Oral Daily 04/19/23 1353 05/10/23 2359   04/19/23 1000  fluconazole (DIFLUCAN) tablet 100 mg        100 mg Oral Daily 04/18/23 1032 05/09/23 0959    04/18/23 1045  fluconazole (DIFLUCAN) tablet 200 mg        200 mg Oral  Once 04/18/23 1032 04/18/23 1110        Medications  Scheduled Meds:  sodium chloride   Intravenous Once   amiodarone  100 mg Oral QHS   empagliflozin  25 mg Oral Daily   fluconazole  100 mg Oral Daily   gabapentin  600 mg Oral QHS   insulin aspart  0-15 Units Subcutaneous TID WC   metFORMIN  1,000 mg Oral BID WC   Continuous Infusions: PRN Meds:.acetaminophen **OR** acetaminophen, ondansetron **OR** ondansetron (ZOFRAN) IV, senna-docusate    Subjective:   Kacin Cesare was seen and examined today.  Dizziness when walking. No chest pain or sob. Wants to go home.   Objective:   Vitals:   04/19/23 1615 04/19/23 2105 04/20/23 0358 04/20/23 0924  BP: (!) 103/57 (!) 107/56 116/64 (!) 113/57  Pulse: (!) 53 (!) 56 (!) 59 60  Resp:  19 18   Temp:  98.6 F (37 C) 98 F (36.7 C) 98.2 F (36.8 C)  TempSrc:  Oral Oral Oral  SpO2:  96% 97% 95%  Weight:      Height:        Intake/Output Summary (Last 24 hours) at 04/20/2023 1354 Last data filed at 04/20/2023 1130 Gross per 24 hour  Intake 507.55 ml  Output 775 ml  Net -267.45 ml   Filed Weights   04/17/23 1438 04/17/23 1848  Weight: 93.4 kg 93.9 kg     Exam General exam: Appears calm and comfortable  Respiratory system: Clear to auscultation. Respiratory effort normal. Cardiovascular system: S1 & S2 heard, RRR.  Gastrointestinal system: Abdomen is nondistended, soft and nontender.  Central nervous system: Alert and oriented.  Extremities: Symmetric 5 x 5 power. Skin: No rashes,  Psychiatry:  Mood & affect appropriate.     Data Reviewed:  I have personally reviewed following labs and imaging studies   CBC Lab Results  Component Value Date   WBC 8.1 04/20/2023   RBC 3.31 (L) 04/20/2023   HGB 9.6 (L) 04/20/2023   HCT 29.0 (L) 04/20/2023   MCV 87.6 04/20/2023   MCH 29.0 04/20/2023   PLT 175 04/20/2023   MCHC 33.1 04/20/2023   RDW  16.1 (H) 04/20/2023   LYMPHSABS 1.2 04/20/2023   MONOABS 0.7 04/20/2023   EOSABS 0.2 04/20/2023   BASOSABS 0.0 04/20/2023     Last metabolic panel Lab Results  Component Value Date   NA  134 (L) 04/20/2023   K 3.5 04/20/2023   CL 105 04/20/2023   CO2 20 (L) 04/20/2023   BUN 12 04/20/2023   CREATININE 1.07 04/20/2023   GLUCOSE 176 (H) 04/20/2023   GFRNONAA >60 04/20/2023   GFRAA 80 01/22/2020   CALCIUM 7.7 (L) 04/20/2023   PROT 6.4 (L) 04/17/2023   ALBUMIN 3.4 (L) 04/17/2023   LABGLOB 3.1 11/17/2021   AGRATIO 1.3 11/17/2021   BILITOT 1.1 04/17/2023   ALKPHOS 100 04/17/2023   AST 14 (L) 04/17/2023   ALT 22 04/17/2023   ANIONGAP 9 04/20/2023    CBG (last 3)  Recent Labs    04/20/23 0449 04/20/23 0733 04/20/23 1128  GLUCAP 181* 178* 168*      Coagulation Profile: No results for input(s): "INR", "PROTIME" in the last 168 hours.   Radiology Studies: No results found.     Kathlen Mody M.D. [facility] 04/20/2023, 1:54 PM  Available via Epic secure chat 7am-7pm After 7 pm, please refer to night coverage provider listed on amion.

## 2023-04-20 NOTE — Consult Note (Signed)
[facility] Consultation Oncology  Name: [name] [name]      MRN: [id number]    Location: A309/A309-01  Date: 04/20/2023 Time:4:23 PM   REFERRING PHYSICIAN: Dr. 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 Blake Divine for further workup and management of adenocarcinoma and a rectosigmoid polyp.  He had colonoscopy for a positive Cologuard test  on 04/14/2023 which showed diverticulosis in the sigmoid and descending colon.  Three 4-6 mm polyps in the rectosigmoid colon, in the ascending colon and in the cecum removed with a cold snare.  One 20 x 10 mm polyp at the rectosigmoid colon removed with a hot snare.  He was started back on Eliquis after initial colonoscopy.  He came to the ER on 04/17/2023 with rectal bleeding.  He underwent repeat colonoscopy on 04/18/2023 which showed two 2-3 mm polyps in the transverse colon and in the ascending colon.  Single post polypectomy ulcer in the cecum.  Single post polypectomy ulcer in the ascending colon.  2 clips in the rectosigmoid colon at the site of previous polypectomy.  2 more clips were placed at the polypectomy site.  Initial pathology report of the rectosigmoid colon polypectomy showed single fragment of invasive moderately differentiated adenocarcinoma.  Other fragments of the hyperplastic polyp, tubular adenoma without high-grade dysplasia or malignancy.  He lives at home with his wife.  His sister had colon cancer in her 52s.  Mother had cancer on her face.  PAST MEDICAL HISTORY:   Past Medical History:  Diagnosis Date   AICD (automatic cardioverter/defibrillator) present    Arthritis    Knee L - probably   Atrial fibrillation (HCC)    BPH (benign prostatic hyperplasia)    CAD (coronary artery disease) 07/06/2011   CHF (congestive heart failure) (HCC)    Coronary artery disease    Diabetes mellitus     Frequency of urination    Heart failure (HCC)    High cholesterol    History of nuclear stress test 09/2010   dipyridamole; mild perfusion defect due to attenuation with mild superimposed ischemia in apical septal, apical, apical inferior & apical lateral regions; rest LV enlarged in size; prominent gut uptake in infero-apical region; no significant ischemia demonstrated; low risk scan    HNP (herniated nucleus pulposus), lumbar    Hypertension    Ischemic cardiomyopathy    LV dysfunction 07/06/2011   Nocturia    Right bundle branch block    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 Wayland Salinas   Sleep apnea    sleep study 10/2010- AHI during total sleep 32.1/hr and during REM 62.3/hr (severe sleep apnea)unable to tolerate c pap   Spinal stenosis of lumbar region    Stroke (HCC)    [place]     ALLERGIES: Allergies  Allergen Reactions   Entresto [Sacubitril-Valsartan] Other (See Comments)    dizziness   Sacubitril Other (See Comments)     Dizziness      MEDICATIONS: I have reviewed the patient's current medications.     PAST SURGICAL HISTORY Past Surgical History:  Procedure Laterality Date   ABDOMINAL AORTOGRAM W/LOWER EXTREMITY Left 11/14/2020   Procedure: ABDOMINAL AORTOGRAM W/LOWER EXTREMITY;  Surgeon: Cephus Shelling, MD;  [facility]: [facility] INVASIVE CV LAB;  Service: Cardiovascular;  Laterality: Left;   ABDOMINAL AORTOGRAM W/LOWER EXTREMITY N/A 01/02/2021   Procedure: ABDOMINAL AORTOGRAM W/LOWER EXTREMITY;  Surgeon: Cephus Shelling, MD;  Location: [facility] CV LAB;  Service: Cardiovascular;  Laterality: N/A;   AMPUTATION Left 03/20/2021   Procedure: Left foot 5th ray amputation;  Surgeon: Toni Arthurs, MD;  Location: [facility] OR;  Service: Orthopedics;  Laterality: Left;   ATRIAL FIBRILLATION ABLATION N/A 01/19/2023   Procedure: ATRIAL FIBRILLATION ABLATION;  Surgeon: [name], MD;   Location: [facility];  Service: Cardiovascular;  Laterality: N/A;   BIV ICD INSERTION CRT-D N/A 04/30/2022   Procedure: BIV ICD INSERTION CRT-D;  Surgeon: [name], MD;  Location: [facility] INVASIVE CV LAB;  Service: Cardiovascular;  Laterality: N/A;   CARDIAC CATHETERIZATION  01/2011   ischemic cardiomyopathy 30-35%, multivessel CAD (Dr. Bishop Limbo)    CARDIAC CATHETERIZATION  09/13/2015   Procedure: Right/Left Heart Cath and Coronary/Graft Angiography;  Surgeon: [name], MD;  Location: [facility] INVASIVE CV LAB;  Service: Cardiovascular;;   CARDIOVERSION N/A 05/16/2019   Procedure: CARDIOVERSION;  Surgeon: [name], MD;  Location: [facility] ENDOSCOPY;  Service: Cardiovascular;  Laterality: N/A;   CARDIOVERSION N/A 10/16/2022   Procedure: CARDIOVERSION;  Surgeon: Thurmon Fair, MD;  [facility]: [facility];  Service: Cardiovascular;  Laterality: N/A;   CARDIOVERSION N/A 12/16/2022   Procedure: CARDIOVERSION;  Surgeon: Little Ishikawa, MD;  Location: [facility] INVAS[facility] CV LAB;  Service: Cardiovascular;  Laterality: N/A;   Colonscopy     CORONARY ARTERY BYPASS GRAFT  08/03/2011   Procedure: CORONARY ARTERY BYPASS GRAFTING (CABG);  Surgeon: Alleen Borne, MD;  Location: [facility] OR;  Service: Open Heart Surgery;  Laterality: N/A;  CABG times three using right saphenous vein and left mammary artery usisng endoscope.   LEFT HEART CATH AND CORS/GRAFTS ANGIOGRAPHY N/A 12/15/2021   Procedure: LEFT HEART CATH AND CORS/GRAFTS ANGIOGRAPHY;  Surgeon: Tonny Bollman, MD;  Location: [facility] INVASIVE CV LAB;  Service: Cardiovascular;  Laterality: N/A;   LEFT HEART CATHETERIZATION WITH CORONARY/GRAFT ANGIOGRAM N/A 09/20/2013   Procedure: LEFT HEART CATHETERIZATION WITH Isabel Caprice;  Surgeon: [name], MD;  Location: [facility] CATH LAB;  Service: Cardiovascular;  Laterality: N/A;   Lower Extremity Arterial Doppler  03/16/2013   bilat ABIs demonstated normal values; R runoff - posterior tibial &  anterior tibial arteries occluded; L runoff - peroneal & posterior tibial arteries occluded, anterior tibial artery appears occluded   LUMBAR LAMINECTOMY/DECOMPRESSION MICRODISCECTOMY N/A 09/12/2014   Procedure: LUMBAR DECOMPRESSION L3-L4, L4-L5, MICRODISCECTOMY L4-L5;  Surgeon: Jene Every, MD;  Location: [facility] ORS;  Service: Orthopedics;  Laterality: N/A;   PERIPHERAL VASCULAR BALLOON ANGIOPLASTY  11/14/2020   Procedure: PERIPHERAL VASCULAR BALLOON ANGIOPLASTY;  Surgeon: Cephus Shelling, MD;  [facility]: [facility];  Service: Cardiovascular;;   PERIPHERAL VASCULAR BALLOON ANGIOPLASTY Left 01/02/2021   Procedure: PERIPHERAL VASCULAR BALLOON ANGIOPLASTY;  Surgeon: Cephus Shelling, MD;  Location: [facility];  Service: Cardiovascular;  Laterality: Left;  Failed PTA   Pilonidal Cyst removed     TRANSTHORACIC ECHOCARDIOGRAM  11/10/2012   EF 40-45%, mild LVH, mild hypokinesis of anteroseptal myocardium, grade 1 diastolic dysfunction; mild MR & calcifed mitral annulus; LA mild-mod dilated; RA mildly dilated    FAMILY HISTORY: Family History  Problem Relation Age of Onset   Cancer Mother    Lung disease Father        & heart disease   Colon polyps Father    Colon cancer Sister  Sudden death Maternal Grandmother    Anesthesia problems Daughter     SOCIAL HISTORY:  reports that he quit smoking about 11 years ago. His smoking use included cigars. He quit smokeless tobacco use about 11 years ago. He reports that he does not currently use alcohol after a past usage of about 1.0 - 2.0 standard drink of alcohol per week. He reports that he does not use drugs.  PERFORMANCE STATUS: The patient's performance status is 1 - Symptomatic but completely ambulatory  PHYSICAL EXAM: Most Recent Vital Signs: Blood pressure (!) 113/57, pulse 60, temperature 98.2 F (36.8 C), temperature source Oral, resp. rate 18, height 6\' 2"  (1.88 m), weight 207 lb 0.2 oz (93.9 kg), SpO2 95%. BP (!)  113/57 (BP Location: Right Arm)   Pulse 60   Temp 98.2 F (36.8 C) (Oral)   Resp 18   Ht 6\' 2"  (1.88 m)   Wt 207 lb 0.2 oz (93.9 kg)   SpO2 95%   BMI 26.58 kg/m  General appearance: alert, cooperative, and appears stated age Lungs: clear to auscultation bilaterally Abdomen: soft, non-tender; bowel sounds normal; no masses,  no organomegaly Extremities:  No edema.  LABORATORY DATA:  Results for orders placed or performed during the hospital encounter of 04/17/23 (from the past 48 hour(s))  Glucose, capillary     Status: Abnormal   Collection Time: 04/18/23  8:05 PM  Result Value Ref Range   Glucose-Capillary 169 (H) 70 - 99 mg/dL    Comment: Glucose reference range applies only to samples taken after fasting for at least 8 hours.  Hemoglobin and hematocrit, blood     Status: Abnormal   Collection Time: 04/18/23 10:26 PM  Result Value Ref Range   Hemoglobin 9.0 (L) 13.0 - 17.0 g/dL   HCT 30.8 (L) 65.7 - 84.6 %    Comment: Performed at [facility], [address], [place], [place] [address]  Glucose, capillary     Status: Abnormal   Collection Time: 04/19/23 12:10 AM  Result Value Ref Range   Glucose-Capillary 142 (H) 70 - 99 mg/dL    Comment: Glucose reference range applies only to samples taken after fasting for at least 8 hours.  Glucose, capillary     Status: Abnormal   Collection Time: 04/19/23  4:33 AM  Result Value Ref Range   Glucose-Capillary 123 (H) 70 - 99 mg/dL    Comment: Glucose reference range applies only to samples taken after fasting for at least 8 hours.  Glucose, capillary     Status: Abnormal   Collection Time: 04/19/23  7:27 AM  Result Value Ref Range   Glucose-Capillary 162 (H) 70 - 99 mg/dL    Comment: Glucose reference range applies only to samples taken after fasting for at least 8 hours.   Comment 1 Notify RN    Comment 2 Document in Chart   Glucose, capillary     Status: Abnormal   Collection Time: 04/19/23 11:09 AM  Result Value Ref Range    Glucose-Capillary 207 (H) 70 - 99 mg/dL    Comment: Glucose reference range applies only to samples taken after fasting for at least 8 hours.   Comment 1 Notify RN    Comment 2 Document in Chart   Hemoglobin and hematocrit, blood     Status: Abnormal   Collection Time: 04/19/23 11:26 AM  Result Value Ref Range   Hemoglobin 9.6 (L) 13.0 - 17.0 g/dL   HCT 28.4 (L) 13.2 - 44.0 %  Comment: Performed at [facility], [address] [place], [place] [address]  Glucose, capillary     Status: Abnormal   Collection Time: 04/19/23  4:49 PM  Result Value Ref Range   Glucose-Capillary 145 (H) 70 - 99 mg/dL    Comment: Glucose reference range applies only to samples taken after fasting for at least 8 hours.   Comment 1 Notify RN    Comment 2 Document in Chart   Glucose, capillary     Status: Abnormal   Collection Time: 04/19/23  8:04 PM  Result Value Ref Range   Glucose-Capillary 164 (H) 70 - 99 mg/dL    Comment: Glucose reference range applies only to samples taken after fasting for at least 8 hours.  Hemoglobin and hematocrit, blood     Status: Abnormal   Collection Time: 04/19/23 10:19 PM  Result Value Ref Range   Hemoglobin 9.5 (L) 13.0 - 17.0 g/dL   HCT 19.1 (L) 47.8 - 29.5 %    Comment: Performed at [facility], [address], [place], [place] [address]  Glucose, capillary     Status: Abnormal   Collection Time: 04/20/23 12:10 AM  Result Value Ref Range   Glucose-Capillary 166 (H) 70 - 99 mg/dL    Comment: Glucose reference range applies only to samples taken after fasting for at least 8 hours.  Glucose, capillary     Status: Abnormal   Collection Time: 04/20/23  3:57 AM  Result Value Ref Range   Glucose-Capillary 360 (H) 70 - 99 mg/dL    Comment: Glucose reference range applies only to samples taken after fasting for at least 8 hours.  Glucose, capillary     Status: Abnormal   Collection Time: 04/20/23  4:49 AM  Result Value Ref Range   Glucose-Capillary 181 (H) 70 - 99 mg/dL     Comment: Glucose reference range applies only to samples taken after fasting for at least 8 hours.  Glucose, capillary     Status: Abnormal   Collection Time: 04/20/23  7:33 AM  Result Value Ref Range   Glucose-Capillary 178 (H) 70 - 99 mg/dL    Comment: Glucose reference range applies only to samples taken after fasting for at least 8 hours.   Comment 1 Notify RN    Comment 2 Document in Chart   CBC with Differential/Platelet     Status: Abnormal   Collection Time: 04/20/23 10:49 AM  Result Value Ref Range   WBC 8.1 4.0 - 10.5 K/uL   RBC 3.31 (L) 4.22 - 5.81 MIL/uL   Hemoglobin 9.6 (L) 13.0 - 17.0 g/dL   HCT 86.5 (L) 78.4 - 69.6 %   MCV 87.6 80.0 - 100.0 fL   MCH 29.0 26.0 - 34.0 pg   MCHC 33.1 30.0 - 36.0 g/dL   RDW 29.5 (H) 28.4 - 13.2 %   Platelets 175 150 - 400 K/uL   nRBC 0.0 0.0 - 0.2 %   Neutrophils Relative % 73 %   Neutro Abs 5.8 1.7 - 7.7 K/uL   Lymphocytes Relative 15 %   Lymphs Abs 1.2 0.7 - 4.0 K/uL   Monocytes Relative 8 %   Monocytes Absolute 0.7 0.1 - 1.0 K/uL   Eosinophils Relative 3 %   Eosinophils Absolute 0.2 0.0 - 0.5 K/uL   Basophils Relative 0 %   Basophils Absolute 0.0 0.0 - 0.1 K/uL   Immature Granulocytes 1 %   Abs Immature Granulocytes 0.09 (H) 0.00 - 0.07 K/uL  Comment: Performed at [facility], [address], [place], [place] [address]  Basic metabolic panel     Status: Abnormal   Collection Time: 04/20/23 10:49 AM  Result Value Ref Range   Sodium 134 (L) 135 - 145 mmol/L   Potassium 3.5 3.5 - 5.1 mmol/L   Chloride 105 98 - 111 mmol/L   CO2 20 (L) 22 - 32 mmol/L   Glucose, Bld 176 (H) 70 - 99 mg/dL    Comment: Glucose reference range applies only to samples taken after fasting for at least 8 hours.   BUN 12 8 - 23 mg/dL   Creatinine, Ser 4.69 0.61 - 1.24 mg/dL   Calcium 7.7 (L) 8.9 - 10.3 mg/dL   GFR, Estimated >62 >95 mL/min    Comment: (NOTE) Calculated using the CKD-EPI Creatinine Equation (2021)    Anion gap 9 5 - 15    Comment:  Performed at [facility], [address] [place], [place] [address]  Glucose, capillary     Status: Abnormal   Collection Time: 04/20/23 11:28 AM  Result Value Ref Range   Glucose-Capillary 168 (H) 70 - 99 mg/dL    Comment: Glucose reference range applies only to samples taken after fasting for at least 8 hours.   Comment 1 Notify RN    Comment 2 Document in Chart       RADIOGRAPHY: No results found.     ASSESSMENT and PLAN:  1.  Endoscopically removed malignant polyp in the rectosigmoid: - From the pathology report, it appears to be a fragmented specimen and margins cannot be clearly assessed.  MSI testing results pending. - Agree with CT abdomen and pelvis.  Will also recommend CEA level. - Would also recommend pelvic MRI, rectal cancer protocol.  However he has a pacemaker. - Recommend evaluation by colorectal surgeon.  Options include transanal local excision or transabdominal resection based on imaging findings. - Follow-up with me after he sees Dr. [name].   All questions were answered. The patient knows to call the clinic with any problems, questions or concerns. We can certainly see the patient much sooner if necessary.    Doreatha Massed

## 2023-04-20 NOTE — Telephone Encounter (Signed)
Referral placed.

## 2023-04-20 NOTE — Progress Notes (Signed)
Patients CBG was 360 mg/dL. Patient reported that he had just ate some graham crackers and drink a regular soda prior to NT checking blood glucose. Rechecked patients CBG per request was 181. MD aware.

## 2023-04-20 NOTE — Telephone Encounter (Signed)
Diflucan was started by an outside provider.

## 2023-04-20 NOTE — Plan of Care (Signed)
  Problem: Education: Goal: Knowledge of General Education information will improve Description: Including pain rating scale, medication(s)/side effects and non-pharmacologic comfort measures Outcome: Progressing   Problem: Clinical Measurements: Goal: Ability to maintain clinical measurements within normal limits will improve Outcome: Progressing   

## 2023-04-20 NOTE — Progress Notes (Signed)
Nutrition Brief Note  Patient identified on the Malnutrition Screening Tool (MST) Report Spoke with patient and a daughter at bedside. Patient typically eats well at home and has been eating >/= 75% of meals while in the hospital. He reports weight decreased when he started on Jardiance medication and when his dose was increased, but weight has been stable for several months. Weight hx reviewed, no significant weight changes noted over the past 6 months. Nutrition focused physical exam completed.  No muscle or subcutaneous fat depletion noticed.  Body mass index is 26.58 kg/m. Patient meets criteria for overweight based on current BMI.   Current diet order is soft, patient is consuming approximately 75-100% of meals at this time. Labs and medications reviewed.   No nutrition interventions warranted at this time. If nutrition issues arise, please consult RD.   Gabriel Rainwater RD, LDN, CNSC Please refer to Amion for contact information.

## 2023-04-21 ENCOUNTER — Encounter: Payer: Self-pay | Admitting: Internal Medicine

## 2023-04-21 ENCOUNTER — Inpatient Hospital Stay (HOSPITAL_COMMUNITY): Payer: Medicare Other

## 2023-04-21 ENCOUNTER — Encounter: Payer: Medicare Other | Admitting: Cardiology

## 2023-04-21 ENCOUNTER — Encounter (HOSPITAL_COMMUNITY): Payer: Self-pay | Admitting: Internal Medicine

## 2023-04-21 ENCOUNTER — Telehealth: Payer: Self-pay

## 2023-04-21 DIAGNOSIS — K625 Hemorrhage of anus and rectum: Secondary | ICD-10-CM | POA: Diagnosis not present

## 2023-04-21 DIAGNOSIS — C189 Malignant neoplasm of colon, unspecified: Secondary | ICD-10-CM | POA: Diagnosis not present

## 2023-04-21 LAB — TYPE AND SCREEN
ABO/RH(D): O NEG
Antibody Screen: NEGATIVE
Unit division: 0
Unit division: 0

## 2023-04-21 LAB — GLUCOSE, CAPILLARY
Glucose-Capillary: 145 mg/dL — ABNORMAL HIGH (ref 70–99)
Glucose-Capillary: 156 mg/dL — ABNORMAL HIGH (ref 70–99)
Glucose-Capillary: 163 mg/dL — ABNORMAL HIGH (ref 70–99)
Glucose-Capillary: 197 mg/dL — ABNORMAL HIGH (ref 70–99)

## 2023-04-21 LAB — BPAM RBC
Blood Product Expiration Date: 202411092359
Blood Product Expiration Date: 202411122359
ISSUE DATE / TIME: 202410200539
Unit Type and Rh: 9500
Unit Type and Rh: 202411092359
Unit Type and Rh: 9500

## 2023-04-21 MED ORDER — AMIODARONE HCL 200 MG PO TABS: 100.0000 mg | ORAL_TABLET | Freq: Every evening | ORAL | Status: DC

## 2023-04-21 MED ORDER — FUROSEMIDE 40 MG PO TABS
40.0000 mg | ORAL_TABLET | Freq: Every day | ORAL | Status: DC
  Administered 2023-04-21: 40 mg via ORAL
  Filled 2023-04-21: qty 1

## 2023-04-21 MED ORDER — MIDODRINE HCL 5 MG PO TABS: 5.0000 mg | ORAL_TABLET | Freq: Three times a day (TID) | ORAL | 2 refills | Status: DC

## 2023-04-21 MED ORDER — FUROSEMIDE 40 MG PO TABS: 40.0000 mg | ORAL_TABLET | Freq: Every day | ORAL | Status: DC

## 2023-04-21 MED ORDER — POLYETHYLENE GLYCOL 3350 17 G PO PACK: 17.0000 g | PACK | Freq: Every day | ORAL | Status: DC | PRN

## 2023-04-21 NOTE — Progress Notes (Signed)
Mobility Specialist Progress Note:    04/21/23 1145  Mobility  Activity Stood at bedside;Ambulated with assistance to bathroom  Level of Assistance Standby assist, set-up cues, supervision of patient - no hands on  Assistive Device None  Distance Ambulated (ft) 15 ft  Range of Motion/Exercises Active;All extremities  Activity Response Tolerated well  Mobility Referral Yes  $Mobility charge 1 Mobility  Mobility Specialist Start Time (ACUTE ONLY) 1145  Mobility Specialist Stop Time (ACUTE ONLY) 1200  Mobility Specialist Time Calculation (min) (ACUTE ONLY) 15 min   Pt received in bed, visitor in room. Agreeable to mobility, required SBA to stand and ambulate with no AD. Tolerated well, see orthostatics below. Deferred further ambulation d/t low BP, pt denies any symptoms except shakiness. Returned pt to bed, NT in room. All needs met.   Supine: 112/53 (71) Sitting EOB: 89/47 (61) Standing: 90/50 (63)  [name] [name] Mobility Specialist Please contact via Special educational needs teacher or  Rehab office at [phone]

## 2023-04-21 NOTE — Care Management Important Message (Signed)
Important Message  Patient Details Verbal IM given mailed copy to home Name: ERHARD CARAMANICA MRN: [id number] Date of Birth: 1945/12/10   Important Message Given:  Yes - Medicare IM     Caren Macadam 04/21/2023, 12:56 PM

## 2023-04-21 NOTE — Progress Notes (Signed)
patient ambulated to the bathroom and once pt was sitting on the toilet pt said he felt lightheaded. vitals were taken and bp was 106/54. MD Laural Benes notified.

## 2023-04-21 NOTE — Telephone Encounter (Signed)
Pt's daughter called requesting to speak with you regarding her dad's care and current stand point. Daughter stated that her dad was told for her to call the office so that you could tell her what you told him when you called the pt a couple days ago.

## 2023-04-21 NOTE — TOC Transition Note (Signed)
Transition of Care [facility]) - CM/SW Discharge Note   Patient Details  Name: ARIELLE HOLLINS MRN: [id number] Date of Birth: [birth year]  [facility] CM/SW Contact:  Leitha Bleak, RN Phone Number: 04/21/2023, 1:21 PM   Clinical Narrative:   Patient discharging home, requesting home health PT, CMS provider options and Medicare ratings reviewed. Patient has no preferences. Morrie Sheldon with [facility] accepted the referral. MD aware to order.      Final next level of care: Home w Home Health Services Barriers to Discharge: Barriers Resolved   Patient Goals and CMS Choice CMS Medicare.gov Compare Post Acute Care list provided to:: Patient Choice offered to / list presented to : Patient  Discharge Placement         Name of family member notified: wife Patient and family notified of of transfer: 04/21/23  Discharge Plan and Services Additional resources added to the After Visit Summary for     Social Determinants of Health (SDOH) Interventions SDOH Screenings   Food Insecurity: No Food Insecurity (04/17/2023)  Housing: Low Risk  (04/17/2023)  Transportation Needs: No Transportation Needs (04/17/2023)  Utilities: Not At Risk (04/17/2023)  Depression (PHQ2-9): Low Risk  (03/17/2023)  Physical Activity: Low Risk  (08/11/2022)   [facility]  Tobacco Use: Medium Risk (04/17/2023)    Readmission Risk Interventions    04/21/2023    1:19 PM  Readmission Risk Prevention Plan  Transportation Screening Complete  PCP or Specialist Appt within 3-5 Days Complete  HRI or Home Care Consult Complete  Social Work Consult for Recovery Care Planning/Counseling Complete  Palliative Care Screening Not Applicable  Medication Review Oceanographer) Complete

## 2023-04-21 NOTE — Plan of Care (Signed)
  Problem: Education: Goal: Knowledge of General Education information will improve Description Including pain rating scale, medication(s)/side effects and non-pharmacologic comfort measures Outcome: Progressing   

## 2023-04-21 NOTE — Discharge Summary (Signed)
Physician Discharge Summary  [name] [name]:[id number] DOB: 1946/02/10 DOA: 04/17/2023  PCP: [name], MD  Admit date: 04/17/2023 Discharge date: 04/21/2023  Disposition:  Home with [facility]   Recommendations for Outpatient Follow-up:  Follow up with Dr. [name] with [facility]  Follow up with Dr. [name] after seeing Dr. [name]  Follow up with [facility] GI as scheduled   Home Health:  PT   Discharge Condition: STABLE   CODE STATUS: FULL DIET: resume previous home diet    Brief Hospitalization Summary: Please see all hospital notes, images, labs for full details of the hospitalization. Admission provider HPI: 77 y.o. male with medical history significant of A-fib on Eliquis, CAD, T2DM, HTN and CHF who presents to the ED for evaluation of rectal bleeding.  Patient reports that he underwent colonoscopy 3 days ago was found to have multiple scattered diverticula as well as 3 polyps that were clipped with cold snare.  He was having normal soft bowel movements until yesterday when he noticed bright red blood from his rectum during bowel movements.  On arrival to ED, his BP were soft, and his stool was positive for blood. His hemoglobin at baseline around 11 dropped to 8.3. 1 unit of prbc transfusion given and his hemoglobin improved to 9.4.  Gi consulted and he underwent repeat colonoscopy, showing A single (solitary) postpolypectomy ulcer in the cecum. Clips were placed. Two clips in the recto-sigmoid colon at site of previous polypectomy. Two more clips placed at polypectomy site to prevent recurrent bleeding,. A single (solitary) postpolypectomy ulcer in the ascending colon.   He was started on clears and eliquis held. His diet advanced to soft and  he was scheduled for discharge.   Earlier today when we tried to walk him, he became dizzy and his BP was around 88/50 mmhg.   He was ordered fluid bolus  Hospital Course by problem list  Lower GI bleed/ post  polypectomy bleeding.  S/p colonoscopy today showing A single (solitary) postpolypectomy ulcer in the cecum. Clips were placed. Two clips in the recto-sigmoid colon at site of previous polypectomy. Two more clips placed at polypectomy site to prevent recurrent bleeding,. A single (solitary) postpolypectomy ulcer in the ascending colon .  He was started on clears , advanced as tolerated. and eliquis held.  GI recommends holding eliquis to restart on Monday.  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  Referral sent to Dr [name] by Dr [name].  Meanwhile oncology consult requested from Dr [name].  Pt to follow up with Dr. [name] after seeing Dr. [name].  CT abd and pelvis with contrast ordered for evaluation of mets.  Hemoglobin staying stable around 9.     Atrial fibrillation Rate controlled with amiodarone. Eliquis is to be held for 7 days.  Restart eliquis on Monday.    Type 2 DM CBG (last 3)  Recent Labs    04/21/23 0354 04/21/23 0735 04/21/23 1155  GLUCAP 156* 163* 197*   SSI. Well controlled. Non insulin dependent.     Orthostatic hypotension Positive orthostatics , small fluid bolus ordered and midodrine started.  Continue to hold lasix, coreg. Temporarily, resume lasix at discharge.   BP much improved on midodrine 5 mg TID    Chronic systolic and diastolic CHF.  He appears compensated.  Last echo shows LVEF of 30 to 35%, with grade 3 diastolic dysfunction on 01/2023. Lasix and coreg held.  Resume lasix at DC  Continue midodrine 5  mg TID for BP support     Mild thrombocytopenia Platelet count of 145,000.    Constipation;  Started on miralax and senna/ colace.    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   Diet - low sodium heart healthy   Complete by: As directed    Discharge instructions   Complete by: As directed    Please follow up with GI for the biopsy results .      Allergies as of 04/21/2023       Reactions   Entresto [sacubitril-valsartan] Other (See Comments)   dizziness   Sacubitril Other (See Comments)    Dizziness        Medication List     STOP taking these medications    carvedilol 3.125 MG tablet Commonly known as: COREG       TAKE these medications    acetaminophen 500 MG tablet Commonly known as: TYLENOL Take 500-1,000 mg by mouth every 6 (six) hours as needed (for pain.).   amiodarone 200 MG tablet Commonly known as: PACERONE Take 0.5 tablets (100 mg total) by mouth at bedtime. Take 0.5 tablets (100 mg total) once daily.   apixaban 5 MG Tabs tablet Commonly known as: Eliquis Take 1 tablet (5 mg total) by mouth 2 (two) times daily. Start taking on: April 26, 2023 What changed: These instructions start on April 26, 2023. If you are unsure what to do until then, ask your doctor or other care provider.   empagliflozin 25 MG Tabs tablet Commonly known as: Jardiance Take 1 tablet (25 mg total) by mouth daily.   fluconazole 100 MG tablet Commonly known as: DIFLUCAN Take 1 tablet (100 mg total) by mouth daily for 20 days.   furosemide 40 MG tablet Commonly known as: LASIX Take 1 tablet (40 mg total) by mouth daily. Take 1  tablet by mouth in the morning and 1 tablet by mouth in the afternoon as needed.   gabapentin 300 MG capsule Commonly known as: NEURONTIN Take 600 mg by mouth at bedtime.   metFORMIN 500 MG 24 hr tablet Commonly known as: GLUCOPHAGE-XR Take 1,000 mg by mouth 2 (two) times daily.   midodrine 5 MG tablet Commonly known as: PROAMATINE Take 1 tablet (5 mg total) by mouth 3 (three) times daily with meals.   polyethylene glycol  17 g packet Commonly known as: MIRALAX / GLYCOLAX Take 17 g by mouth daily as needed for mild constipation.   rosuvastatin 10 MG tablet Commonly known as: CRESTOR Take 1 tablet (10 mg total) by mouth every other day.   senna-docusate 8.6-50 MG tablet Commonly known as: Senokot-S Take 1 tablet by mouth at bedtime as needed for mild constipation.        Follow-up Information     [name], [name], MD. Schedule an appointment as soon as possible for a visit in 1 week(s).   Specialty: Family Medicine Contact information: [address] [place] [address] [phone]         [facility] Follow up.   Why: PT will call to schedule your first home visit. Contact information: [address] [place]  [place] [address] [phone]                Allergies  Allergen Reactions   Entresto [Sacubitril-Valsartan] Other (See Comments)    dizziness   Sacubitril Other (See Comments)     Dizziness   Allergies as of 04/21/2023       Reactions   Entresto [sacubitril-valsartan] Other (See Comments)   dizziness   Sacubitril Other (See Comments)    Dizziness        Medication List     STOP taking these medications    carvedilol 3.125 MG tablet Commonly known as: COREG       TAKE these medications    acetaminophen 500 MG tablet Commonly known as: TYLENOL Take 500-1,000 mg by mouth every 6 (six) hours as needed (for pain.).   amiodarone 200 MG tablet Commonly known as: PACERONE Take 0.5 tablets (100 mg total) by mouth at bedtime. Take 0.5 tablets (100 mg total) once daily.   apixaban 5 MG Tabs tablet Commonly known as: Eliquis Take 1 tablet (5 mg total) by mouth 2 (two) times daily. Start taking on: April 26, 2023 What changed: These instructions start on April 26, 2023. If you are unsure what to do until then, ask your doctor or other care provider.   empagliflozin 25 MG Tabs tablet Commonly known as: Jardiance Take 1 tablet (25 mg  total) by mouth daily.   fluconazole 100 MG tablet Commonly known as: DIFLUCAN Take 1 tablet (100 mg total) by mouth daily for 20 days.   furosemide 40 MG tablet Commonly known as: LASIX Take 1 tablet (40 mg total) by mouth daily. Take 1  tablet by mouth in the morning and 1 tablet by mouth in the afternoon as needed.   gabapentin 300 MG capsule Commonly known as: NEURONTIN Take 600 mg by mouth at bedtime.   metFORMIN 500 MG 24 hr tablet Commonly known as: GLUCOPHAGE-XR Take 1,000 mg by mouth 2 (two) times daily.   midodrine 5 MG tablet Commonly known as: PROAMATINE Take 1 tablet (5 mg total) by mouth 3 (three) times daily with meals.   polyethylene glycol 17 g packet Commonly known as: MIRALAX / GLYCOLAX Take 17 g by mouth daily as needed for mild constipation.   rosuvastatin 10 MG tablet Commonly known as: CRESTOR Take 1 tablet (10 mg total) by mouth every other day.   senna-docusate 8.6-50 MG tablet Commonly known as: Senokot-S Take 1 tablet by mouth at bedtime as needed for mild constipation.        Procedures/Studies: DG Chest 2 View  Result Date: 04/21/2023 CLINICAL DATA:  Pleural effusion. EXAM: CHEST - 2 VIEW COMPARISON:  April 30, 2022. FINDINGS: Stable cardiomediastinal silhouette. Sternotomy wires are noted. Left-sided defibrillator is unchanged. Right lung is clear. Minimal left basilar subsegmental atelectasis is noted with minimal left pleural effusion. IMPRESSION: Minimal left basilar subsegmental atelectasis with minimal left pleural effusion. Electronically Signed   By: Lupita Raider M.D.   On: 04/21/2023 14:50   CT ABDOMEN PELVIS W CONTRAST  Result Date: 04/20/2023 CLINICAL DATA:  Staging colon cancer.  * Tracking Code: BO * EXAM: CT ABDOMEN AND PELVIS WITH CONTRAST TECHNIQUE: Multidetector CT imaging of the abdomen and pelvis was performed using the standard protocol following bolus administration of intravenous contrast. RADIATION DOSE REDUCTION:  This exam was performed according to the departmental dose-optimization program which includes automated exposure control, adjustment of the mA and/or kV according to patient size and/or use of iterative reconstruction  technique. CONTRAST:  OMNIPAQUE IOHEXOL 300 MG/ML  SOLN COMPARISON:  Chest CT scan 03/04/2023 FINDINGS: Lower chest: Streak artifact related to the patient's defibrillator lead. Heart is enlarged. Status post median sternotomy. There are new small bilateral pleural effusions with the adjacent linear opacities likely atelectasis or infiltrate. Recommend follow-up. Hepatobiliary: Stones in the nondilated gallbladder. There is some reflux of contrast into the IVC and hepatic veins. There is delayed opacification of the hepatic veins. No space-occupying liver lesion. Patent portal vein. Pancreas: Unremarkable. No pancreatic ductal dilatation or surrounding inflammatory changes. Spleen: Spleen is nonenlarged.  Small splenule in the splenic hilum. Adrenals/Urinary Tract: Adrenal glands are preserved. Mild bilateral renal atrophy. No enhancing renal mass. Prominent renal sinus fat. No collecting system dilatation. The ureters have normal course and caliber extending down to the bladder. Branch renal artery calcifications are seen. Of note there is a early branches of the right renal artery within 1 cm of the origin. Moderate nonspecific perinephric stranding. Preserved contours of the urinary bladder. Stomach/Bowel: Oral contrast was provided. Stomach and small bowel are nondilated. Normal appendix. Large bowel has some scattered debris, stool with oral contrast. Scattered left-sided colonic diverticula. There is a metallic clip identified along the course of the bowel at the distal sigmoid colon as seen on series 2, image 62. Similar focus identified near the level of the ileocecal valve. Please correlate with the recent colonoscopy with clip placement from 04/18/2023. The rectosigmoid colon is  relatively decompressed and there is slight wall thickening but no clear mass lesion. Again please correlate with the colonoscopy findings. Vascular/Lymphatic: Advanced vascular calcifications are seen along the aorta and branch vessels. Normal caliber aorta and IVC. No specific abnormal lymph node enlargement identified in the abdomen and pelvis. Reproductive: Prostate is unremarkable. Other: Anasarca. Scattered mesenteric haziness. There is nonspecific perirectal and presacral fat stranding. Trace ascites. Musculoskeletal: Moderate degenerative changes of the spine and pelvis. There are bridging osteophytes seen along the spine. Multilevel stenosis. Surgical changes at L4 from laminectomy. Trace retrolisthesis of L3 on L4. Bridging osteophytes along the sacroiliac joints, left-greater-than-right. IMPRESSION: No bowel obstruction, free air. Metallic clips seen along the course of the colon with one at the level of the ileocecal valve in the other at the distal sigmoid colon. Please correlate with recent colonoscopy. Scattered mesenteric haziness. There is also trace ascites and presacral fat stranding. Anasarca. No developing liver mass, lymph node enlargement. No mesenteric lesion. Gallstones. New small bilateral pleural effusions with some adjacent opacity. Recommend follow-up Electronically Signed   By: Karen Kays M.D.   On: 04/20/2023 18:27     Subjective: Pt without specific complaints.  He is arranging to follow up with [facility] Dr. [name] and with Dr. [name].   Discharge Exam: Vitals:   04/21/23 0350 04/21/23 0930  BP: 113/63 (!) 106/54  Pulse: 63 65  Resp: 20 18  Temp: 97.8 F (36.6 C)   SpO2: 97% 99%   Vitals:   04/20/23 0924 04/20/23 2024 04/21/23 0350 04/21/23 0930  BP: (!) 113/57 (!) 106/55 113/63 (!) 106/54  Pulse: 60 (!) 55 63 65  Resp:  18 20 18   Temp: 98.2 F (36.8 C) (!) 96.7 F (35.9 C) 97.8 F (36.6 C)   TempSrc: Oral Oral    SpO2: 95% 98% 97% 99%  Weight:      Height:         General: Pt is alert, awake, not in acute distress Cardiovascular: RRR, S1/S2 +, no rubs, no gallops Respiratory: CTA  bilaterally, no wheezing, no rhonchi Abdominal: Soft, NT, ND, bowel sounds + Extremities: no edema, no cyanosis   The results of significant diagnostics from this hospitalization (including imaging, microbiology, ancillary and laboratory) are listed below for reference.     Microbiology: Recent Results (from the past 240 hour(s))  KOH prep     Status: None   Collection Time: 04/14/23 10:38 AM   Specimen: PATH Cytology brushing; Body Fluid  Result Value Ref Range Status   Specimen Description ESOPHAGUS  Final   Special Requests NONE  Final   KOH Prep   Final    BUDDING YEAST SEEN Performed at [facility], [address], [place], [place] [address]    Report Status 04/14/2023 FINAL  Final     Labs: BNP (last 3 results) Recent Labs    10/02/22 0937 11/10/22 0600 11/11/22 0741  BNP 323.2* 723.8* 413.9*   Basic Metabolic Panel: Recent Labs  Lab 04/17/23 1454 04/20/23 1049  NA 136 134*  K 3.7 3.5  CL 102 105  CO2 26 20*  GLUCOSE 301* 176*  BUN 20 12  CREATININE 0.92 1.07  CALCIUM 8.4* 7.7*   Liver Function Tests: Recent Labs  Lab 04/17/23 1454  AST 14*  ALT 22  ALKPHOS 100  BILITOT 1.1  PROT 6.4*  ALBUMIN 3.4*   No results for input(s): "LIPASE", "AMYLASE" in the last 168 hours. No results for input(s): "AMMONIA" in the last 168 hours. CBC: Recent Labs  Lab 04/17/23 1454 04/17/23 2210 04/18/23 0310 04/18/23 1112 04/18/23 2226 04/19/23 1126 04/19/23 2219 04/20/23 1049  WBC 7.8  --  6.5  --   --   --   --  8.1  NEUTROABS  --   --   --   --   --   --   --  5.8  HGB 10.6*   < > 8.3* 9.4* 9.0* 9.6* 9.5* 9.6*  HCT 32.3*   < > 24.8* 28.5* 27.4* 28.7* 28.4* 29.0*  MCV 86.8  --  86.1  --   --   --   --  87.6  PLT 186  --  145*  --   --   --   --  175   < > = values in this interval not displayed.   Cardiac Enzymes: No results  for input(s): "CKTOTAL", "CKMB", "CKMBINDEX", "TROPONINI" in the last 168 hours. BNP: Invalid input(s): "POCBNP" CBG: Recent Labs  Lab 04/20/23 2009 04/21/23 0025 04/21/23 0354 04/21/23 0735 04/21/23 1155  GLUCAP 166* 145* 156* 163* 197*   D-Dimer No results for input(s): "DDIMER" in the last 72 hours. Hgb A1c No results for input(s): "HGBA1C" in the last 72 hours. Lipid Profile No results for input(s): "CHOL", "HDL", "LDLCALC", "TRIG", "CHOLHDL", "LDLDIRECT" in the last 72 hours. Thyroid function studies No results for input(s): "TSH", "T4TOTAL", "T3FREE", "THYROIDAB" in the last 72 hours.  Invalid input(s): "FREET3" Anemia work up No results for input(s): "VITAMINB12", "FOLATE", "FERRITIN", "TIBC", "IRON", "RETICCTPCT" in the last 72 hours. Urinalysis    Component Value Date/Time   COLORURINE YELLOW 04/17/2023 1627   APPEARANCEUR CLEAR 04/17/2023 1627   APPEARANCEUR Clear 09/18/2020 1152   LABSPEC 1.030 04/17/2023 1627   PHURINE 6.0 04/17/2023 1627   GLUCOSEU >=500 (A) 04/17/2023 1627   HGBUR SMALL (A) 04/17/2023 1627   BILIRUBINUR NEGATIVE 04/17/2023 1627   BILIRUBINUR Negative 09/18/2020 1152   KETONESUR NEGATIVE 04/17/2023 1627   PROTEINUR NEGATIVE 04/17/2023 1627   UROBILINOGEN 0.2 08/04/2013 1448   NITRITE NEGATIVE  04/17/2023 1627   LEUKOCYTESUR NEGATIVE 04/17/2023 1627   Sepsis Labs Recent Labs  Lab 04/17/23 1454 04/18/23 0310 04/20/23 1049  WBC 7.8 6.5 8.1   Microbiology Recent Results (from the past 240 hour(s))  KOH prep     Status: None   Collection Time: 04/14/23 10:38 AM   Specimen: PATH Cytology brushing; Body Fluid  Result Value Ref Range Status   Specimen Description ESOPHAGUS  Final   Special Requests NONE  Final   KOH Prep   Final    BUDDING YEAST SEEN Performed at [facility], [address] [place], [place] [address]    Report Status 04/14/2023 FINAL  Final   Time coordinating discharge: 46 mins   SIGNED:  Standley Dakins,  MD  [facility] 04/21/2023, 5:27 PM How to contact the [facility] Attending or Consulting provider 7A - 7P or covering provider during after hours 7P -7A, for this patient?  Check the care team in [facility] and look for a) attending/consulting [facility] provider listed and b) the [facility] team listed Log into [email or web address] and use [place]'s universal password to access. If you do not have the password, please contact the hospital operator. Locate the [facility] provider you are looking for under [facility] and page to a number that you can be directly reached. If you still have difficulty reaching the provider, please page the [facility] (Director on Call) for the Hospitalists listed on amion for assistance.

## 2023-04-22 ENCOUNTER — Encounter (HOSPITAL_COMMUNITY): Payer: Medicare Other

## 2023-04-22 ENCOUNTER — Encounter (INDEPENDENT_AMBULATORY_CARE_PROVIDER_SITE_OTHER): Payer: Medicare Other

## 2023-04-22 DIAGNOSIS — Z95810 Presence of automatic (implantable) cardiac defibrillator: Secondary | ICD-10-CM

## 2023-04-22 DIAGNOSIS — I5022 Chronic systolic (congestive) heart failure: Secondary | ICD-10-CM

## 2023-04-22 LAB — CEA: CEA: 1.8 ng/mL (ref 0.0–4.7)

## 2023-04-22 NOTE — Progress Notes (Signed)
EPIC Encounter for ICM Monitoring  Patient Name: [name] [name] is a 77 y.o. male Date: 04/22/2023 Primary Care Physican: [name], MD Primary Cardiologist: [facility]        Electrophysiologist: [name] Bi-V Pacing: 99.1%         09/21/2022 Weight: 213 lbs 10/06/2022 Weight: 219 lbs  11/17/2022 Weight: 212.5 lbs 12/22/2022 Weight: 211 lbs 01/13/2023 Weight: 209 lbs   Clinical Status Since 10-Mar-2023 Time in AT/AF  0.0 hr/day (0.0%)                                                          Per my chart message from daughter patient hospitalized following colonoscopy due to bleeding issues.  Was not given Lasix during hospitalization and patient is feeling weak and depleted.   Optivol thoracic impedance suggesting possible fluid accumulation starting 10/3 and became worse during and following hospital discharge 10/23.    Prescribed:  Furosemide 40 mg take 1 tablet(s) (40 mg total) by mouth daily.  Take 1 tablet by mouth in the morning and 1 tablet by mouth in the afternoon as needed.   Labs: 04/20/2023 Creatinine 1.07, BUN 12, Potassium 3.5, Sodium 134, GFR >60  04/17/2023 Creatinine 0.92, BUN 20, Potassium 3.7, Sodium 136, GFR >60  04/09/2023 Creatinine 1.06, BUN 22, Potassium 3.5, Sodium 136, GFR >60  01/05/2023 Creatinine 1.27, BUN 22, Potassium 4.2, Sodium 142, GFR 58 12/02/2022 Creatinine 1.20, BUN 17, Potassium 4.0, Sodium 137, GFR >60  11/11/2022 Creatinine 1.18, BUN 20, Potassium 3.6, Sodium 134, GFR >60  11/10/2022 Creatinine 1.25, BUN 21, Potassium 3.8, Sodium 139, GFR 59  11/09/2022 Creatinine 1.39, BUN 26, Potassium 3.4, Sodium 136, GFR 52 10/12/2022 Creatinine 1.04, BUN 17, Potassium 4.8, Sodium 144  10/02/2022 Creatinine 1.13, BUN 19, Potassium 4.3, Sodium 141, GFR 67  09/07/2022 Creatinine 0.98, BUN 20, Potassium 4.2, Sodium 144, GFR 79  A complete set of results can be found in Results Review.   Recommendations:   Daughter sent mychart message to Dr [facility] office on  10/23 regarding patient's condition at 10/23 discharge.  Per Chart discharge, should resume Lasix at discharge.  Copy sent to Dr [name].     Follow-up plan: ICM clinic phone appointment on 05/03/2023 to recheck fluid levels.   91 day device clinic remote transmission 05/03/2023.     EP/Cardiology Office Visits:  Recall 08/02/2023 with Dr [name].   Recall 08/25/2023 with Dr [name].   Copy of ICM check sent to Dr. [name].     3 month ICM trend: 04/22/2023.    12-14 Month ICM trend:     Karie Soda, RN 04/22/2023 7:11 AM

## 2023-04-22 NOTE — Progress Notes (Signed)
See mychart message for recommendations from Dr [name] to patient's daughter.

## 2023-04-23 ENCOUNTER — Encounter (HOSPITAL_COMMUNITY): Payer: Self-pay | Admitting: Gastroenterology

## 2023-04-23 NOTE — Telephone Encounter (Signed)
I returned [name] [name]  call [phone].  She had questions regarding status of her father's colon.  Reasoning behind need for surgery, etc.  I answered multiple questions.  Understand she is seeing a [place] surgery this coming Friday although it may not be with Dr. [name] as she describes.  I also told her that do not be surprised if her father was referred for oncology.  Hopefully, oncology services will not be needed.  Her questions were answered.  Told her if she had any issues or questions in the future please do not hesitate to call me.

## 2023-04-25 NOTE — Progress Notes (Unsigned)
Cx lesion is 90% stenosed. The lesion is eccentric. The lesion is severely calcified. Mid Cx to Dist Cx lesion is 90% stenosed. The lesion is discrete.  Right Coronary Artery Prox RCA lesion is 60% stenosed. The lesion is type C and discrete. The lesion is severely calcified. Dist RCA lesion is 50% stenosed.  Right Posterior Descending Artery Vessel is small in size.  Right Posterior Atrioventricular Artery Vessel is small in size. RPAV lesion is 100% stenosed.  Graft To RPDA And is moderate in size.  The graft exhibits minimal luminal irregularities. Origin to Prox Graft lesion is 100% stenosed.  Graft To RPAV And is small.  The graft exhibits no  disease. Origin to Prox Graft lesion is 100% stenosed.  LIMA LIMA Graft To Dist LAD LIMA and is moderate in size.  The graft exhibits no disease. There is competitive flow. The LIMA to LAD graft is widely patent with no stenosis.  This fills the entirety of the LAD as it is occluded just beyond the first diagonal branch  Intervention  No interventions have been documented.   CARDIAC CATHETERIZATION  CARDIAC CATHETERIZATION 09/13/2015  Narrative  SVG was injected is moderate in size. numerous valves  The graft exhibits minimal luminal irregularities.  SVG was injected is small, and is anatomically normal.  Prox RCA lesion, 85% stenosed.  Prox LAD lesion, 100% stenosed.  Pedicle LIMA was injected is moderate in size, and is anatomically normal.  There is competitive flow.  Mid Cx to Dist Cx lesion, 30% stenosed.  Post Atrio lesion, 100% stenosed.  Ost LM to LM lesion, 30% stenosed.  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 Patent LIMA-LAD, SVG-PDA and SVG-PLB grafts with TIMI III flow and good distal runoff. LVEDP = 16 mmHg. Moderately reduced CO by Fick of 4.8 L/min and CI of 2 L/min, RA of 6 and PA mean of 29 mmHg. Compensated right and left heart pressures. LVEF 30-35% with global hypokinesis and inferior akinesis.  [name], MD, [facility] Attending Cardiologist [facility]  Findings Coronary Findings Diagnostic  Dominance: Right  Left Main Moderately Calcified discrete.  Left Anterior Descending . Vessel is small. Severely Calcified.  Left Circumflex . Vessel is small. Discrete.  Right Coronary Artery The lesion is type C Discrete.  Right Posterior Descending Artery The vessel is small in size.  Right Posterior Atrioventricular Artery The vessel is small in size.  Single Graft Graft To RPDA SVG was injected is moderate in size. numerous valves  The graft exhibits minimal luminal irregularities.  Single  Graft Graft To RPAV SVG was injected is small, and is anatomically normal.  LIMA LIMA Graft To Dist LAD LIMA was injected is moderate in size, and is anatomically normal. There is competitive flow.  Intervention  No interventions have been documented.   STRESS TESTS  NM MYOCAR MULTI W/SPECT W 10/20/2010   ECHOCARDIOGRAM  ECHOCARDIOGRAM COMPLETE 02/19/2023  Narrative ECHOCARDIOGRAM REPORT    Patient Name:   [name] [name] Date of Exam: 02/19/2023 Medical Rec #:  [id number]      Height:       74.0 in Accession #:    [id number]     Weight:       205.2 lb Date of Birth:  1945-12-16     BSA:          2.197 m Patient Age:    77 years       BP:  Cx lesion is 90% stenosed. The lesion is eccentric. The lesion is severely calcified. Mid Cx to Dist Cx lesion is 90% stenosed. The lesion is discrete.  Right Coronary Artery Prox RCA lesion is 60% stenosed. The lesion is type C and discrete. The lesion is severely calcified. Dist RCA lesion is 50% stenosed.  Right Posterior Descending Artery Vessel is small in size.  Right Posterior Atrioventricular Artery Vessel is small in size. RPAV lesion is 100% stenosed.  Graft To RPDA And is moderate in size.  The graft exhibits minimal luminal irregularities. Origin to Prox Graft lesion is 100% stenosed.  Graft To RPAV And is small.  The graft exhibits no  disease. Origin to Prox Graft lesion is 100% stenosed.  LIMA LIMA Graft To Dist LAD LIMA and is moderate in size.  The graft exhibits no disease. There is competitive flow. The LIMA to LAD graft is widely patent with no stenosis.  This fills the entirety of the LAD as it is occluded just beyond the first diagonal branch  Intervention  No interventions have been documented.   CARDIAC CATHETERIZATION  CARDIAC CATHETERIZATION 09/13/2015  Narrative  SVG was injected is moderate in size. numerous valves  The graft exhibits minimal luminal irregularities.  SVG was injected is small, and is anatomically normal.  Prox RCA lesion, 85% stenosed.  Prox LAD lesion, 100% stenosed.  Pedicle LIMA was injected is moderate in size, and is anatomically normal.  There is competitive flow.  Mid Cx to Dist Cx lesion, 30% stenosed.  Post Atrio lesion, 100% stenosed.  Ost LM to LM lesion, 30% stenosed.  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 Patent LIMA-LAD, SVG-PDA and SVG-PLB grafts with TIMI III flow and good distal runoff. LVEDP = 16 mmHg. Moderately reduced CO by Fick of 4.8 L/min and CI of 2 L/min, RA of 6 and PA mean of 29 mmHg. Compensated right and left heart pressures. LVEF 30-35% with global hypokinesis and inferior akinesis.  [name], MD, [facility] Attending Cardiologist [facility]  Findings Coronary Findings Diagnostic  Dominance: Right  Left Main Moderately Calcified discrete.  Left Anterior Descending . Vessel is small. Severely Calcified.  Left Circumflex . Vessel is small. Discrete.  Right Coronary Artery The lesion is type C Discrete.  Right Posterior Descending Artery The vessel is small in size.  Right Posterior Atrioventricular Artery The vessel is small in size.  Single Graft Graft To RPDA SVG was injected is moderate in size. numerous valves  The graft exhibits minimal luminal irregularities.  Single  Graft Graft To RPAV SVG was injected is small, and is anatomically normal.  LIMA LIMA Graft To Dist LAD LIMA was injected is moderate in size, and is anatomically normal. There is competitive flow.  Intervention  No interventions have been documented.   STRESS TESTS  NM MYOCAR MULTI W/SPECT W 10/20/2010   ECHOCARDIOGRAM  ECHOCARDIOGRAM COMPLETE 02/19/2023  Narrative ECHOCARDIOGRAM REPORT    Patient Name:   [name] [name] Date of Exam: 02/19/2023 Medical Rec #:  [id number]      Height:       74.0 in Accession #:    [id number]     Weight:       205.2 lb Date of Birth:  1945-12-16     BSA:          2.197 m Patient Age:    77 years       BP:  Cx lesion is 90% stenosed. The lesion is eccentric. The lesion is severely calcified. Mid Cx to Dist Cx lesion is 90% stenosed. The lesion is discrete.  Right Coronary Artery Prox RCA lesion is 60% stenosed. The lesion is type C and discrete. The lesion is severely calcified. Dist RCA lesion is 50% stenosed.  Right Posterior Descending Artery Vessel is small in size.  Right Posterior Atrioventricular Artery Vessel is small in size. RPAV lesion is 100% stenosed.  Graft To RPDA And is moderate in size.  The graft exhibits minimal luminal irregularities. Origin to Prox Graft lesion is 100% stenosed.  Graft To RPAV And is small.  The graft exhibits no  disease. Origin to Prox Graft lesion is 100% stenosed.  LIMA LIMA Graft To Dist LAD LIMA and is moderate in size.  The graft exhibits no disease. There is competitive flow. The LIMA to LAD graft is widely patent with no stenosis.  This fills the entirety of the LAD as it is occluded just beyond the first diagonal branch  Intervention  No interventions have been documented.   CARDIAC CATHETERIZATION  CARDIAC CATHETERIZATION 09/13/2015  Narrative  SVG was injected is moderate in size. numerous valves  The graft exhibits minimal luminal irregularities.  SVG was injected is small, and is anatomically normal.  Prox RCA lesion, 85% stenosed.  Prox LAD lesion, 100% stenosed.  Pedicle LIMA was injected is moderate in size, and is anatomically normal.  There is competitive flow.  Mid Cx to Dist Cx lesion, 30% stenosed.  Post Atrio lesion, 100% stenosed.  Ost LM to LM lesion, 30% stenosed.  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 Patent LIMA-LAD, SVG-PDA and SVG-PLB grafts with TIMI III flow and good distal runoff. LVEDP = 16 mmHg. Moderately reduced CO by Fick of 4.8 L/min and CI of 2 L/min, RA of 6 and PA mean of 29 mmHg. Compensated right and left heart pressures. LVEF 30-35% with global hypokinesis and inferior akinesis.  [name], MD, [facility] Attending Cardiologist [facility]  Findings Coronary Findings Diagnostic  Dominance: Right  Left Main Moderately Calcified discrete.  Left Anterior Descending . Vessel is small. Severely Calcified.  Left Circumflex . Vessel is small. Discrete.  Right Coronary Artery The lesion is type C Discrete.  Right Posterior Descending Artery The vessel is small in size.  Right Posterior Atrioventricular Artery The vessel is small in size.  Single Graft Graft To RPDA SVG was injected is moderate in size. numerous valves  The graft exhibits minimal luminal irregularities.  Single  Graft Graft To RPAV SVG was injected is small, and is anatomically normal.  LIMA LIMA Graft To Dist LAD LIMA was injected is moderate in size, and is anatomically normal. There is competitive flow.  Intervention  No interventions have been documented.   STRESS TESTS  NM MYOCAR MULTI W/SPECT W 10/20/2010   ECHOCARDIOGRAM  ECHOCARDIOGRAM COMPLETE 02/19/2023  Narrative ECHOCARDIOGRAM REPORT    Patient Name:   [name] [name] Date of Exam: 02/19/2023 Medical Rec #:  [id number]      Height:       74.0 in Accession #:    [id number]     Weight:       205.2 lb Date of Birth:  1945-12-16     BSA:          2.197 m Patient Age:    77 years       BP:  Cardiology Office Note    Date:  04/27/2023  ID:  [name] [name], DOB Nov 10, 1945, MRN [id number] PCP:  [name], MD  Cardiologist:  [name], MD  Electrophysiologist:  [name], MD   Chief Complaint: Hospital follow up   History of Present Illness: .    [name] [name] is a 77 y.o. male with visit-pertinent history of CAD s/p CABG in 2013 with ischemic cardiomyopathy-EF 20-25% prior to surgery, improved to 40-45% by 10/2012, now 30-35%, mild MR, PAD, DM and PAF on chronic anticoagulation s/p ablation. Reduction in EF led to ICD insertion in 2023.   Heart catheterization in 08/2013 showed patent LIMA to LAD, SVG-PLA, SVG-PDA.  Most recent heart catheterization in 11/2021 showed patent LIMA to LAD but occlusion of both SVG grafts.  Given wall motion abnormality on echocardiogram, viability study was recommended to help make decisions regarding complex atherectomy and intervention of the left circumflex that would involve stenting back of the left main and covering the long segment throughout the circumflex to treat multiple severe calcific lesions.  Cardiac MRI was pursued and showed EF 20% and severe thinning in the apical to basal lateral wall and apical inferior wall suggesting nonviability in these regions.  Therefore complex intervention was not pursued.  He was treated medically.  Given persistent reduction in EF he underwent BiV ICD insertion CRT-D on 04/30/2022.  In March 2024 he reported worsening shortness of breath.  Device interrogation showed possible fluid accumulation in atrial fibrillation.  Diuretic was increased and EKG showed underlying A-fib with ventricularly paced rhythm and PVCs.  He presented for scheduled outpatient cardioversion on 10/16/2022 and was successfully cardioverted to a paced BiV rhythm.  He was seen by Dr. [name] on 10/27/2022 and was back in afib, he was started on amiodarone.  Cardiology was consulted in 10/2022 while patient was admitted in  setting of mechanical fall resulting in subdural hematoma 4 mm without any mass effect.  After discharge he fell again, he resumed Eliquis on 11/24/22. On 12/23/2022 he presented for DCCV which was successful.  On 01/19/23 he underwent afib ablation with Dr. [name], on follow up with afib clinic on 8/20 he reported no further afib. He was last seen in clinic on 02/26/23 by Dr. [name], it was noted that he had lost 50 lbs over the past year, he had an abnormal cologuard test with his PCP.   On 04/17/23 patient underwent colonoscopy, was found to have multiple scattered diverticula as well as 3 polyps that were clipped with cold snare's.  Following he had normal bowel movements until 2 days after colonoscopy when he noticed bright red blood from his rectum during bowel movements.  He presented to the ED on 04/19/2023, blood pressures were soft and stools positive for blood.  His hemoglobin at baseline is around 11, decreased to 8.3.  He received 1 unit of packed red blood cells, hemoglobin proved to 9.4.  He underwent repeat colonoscopy while inpatient showing a single post polypectomy ulcer in the cecum.  He had multiple clips placed at previous polypectomy.  While inpatient he was found to be orthostatic, he was given a 500 mL fluid bolus, his Lasix was held while inpatient.  He was started on midodrine 5 mg 3 times daily.  His Coreg was discontinued, he was instructed to resume Eliquis on 04/26/2023.  On 04/22/2023 he had a device check which showed 0% A-fib burden, OptiVol thoracic impedance suggested possible fluid accumulation starting 10/3 and  Cx lesion is 90% stenosed. The lesion is eccentric. The lesion is severely calcified. Mid Cx to Dist Cx lesion is 90% stenosed. The lesion is discrete.  Right Coronary Artery Prox RCA lesion is 60% stenosed. The lesion is type C and discrete. The lesion is severely calcified. Dist RCA lesion is 50% stenosed.  Right Posterior Descending Artery Vessel is small in size.  Right Posterior Atrioventricular Artery Vessel is small in size. RPAV lesion is 100% stenosed.  Graft To RPDA And is moderate in size.  The graft exhibits minimal luminal irregularities. Origin to Prox Graft lesion is 100% stenosed.  Graft To RPAV And is small.  The graft exhibits no  disease. Origin to Prox Graft lesion is 100% stenosed.  LIMA LIMA Graft To Dist LAD LIMA and is moderate in size.  The graft exhibits no disease. There is competitive flow. The LIMA to LAD graft is widely patent with no stenosis.  This fills the entirety of the LAD as it is occluded just beyond the first diagonal branch  Intervention  No interventions have been documented.   CARDIAC CATHETERIZATION  CARDIAC CATHETERIZATION 09/13/2015  Narrative  SVG was injected is moderate in size. numerous valves  The graft exhibits minimal luminal irregularities.  SVG was injected is small, and is anatomically normal.  Prox RCA lesion, 85% stenosed.  Prox LAD lesion, 100% stenosed.  Pedicle LIMA was injected is moderate in size, and is anatomically normal.  There is competitive flow.  Mid Cx to Dist Cx lesion, 30% stenosed.  Post Atrio lesion, 100% stenosed.  Ost LM to LM lesion, 30% stenosed.  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 Patent LIMA-LAD, SVG-PDA and SVG-PLB grafts with TIMI III flow and good distal runoff. LVEDP = 16 mmHg. Moderately reduced CO by Fick of 4.8 L/min and CI of 2 L/min, RA of 6 and PA mean of 29 mmHg. Compensated right and left heart pressures. LVEF 30-35% with global hypokinesis and inferior akinesis.  [name], MD, [facility] Attending Cardiologist [facility]  Findings Coronary Findings Diagnostic  Dominance: Right  Left Main Moderately Calcified discrete.  Left Anterior Descending . Vessel is small. Severely Calcified.  Left Circumflex . Vessel is small. Discrete.  Right Coronary Artery The lesion is type C Discrete.  Right Posterior Descending Artery The vessel is small in size.  Right Posterior Atrioventricular Artery The vessel is small in size.  Single Graft Graft To RPDA SVG was injected is moderate in size. numerous valves  The graft exhibits minimal luminal irregularities.  Single  Graft Graft To RPAV SVG was injected is small, and is anatomically normal.  LIMA LIMA Graft To Dist LAD LIMA was injected is moderate in size, and is anatomically normal. There is competitive flow.  Intervention  No interventions have been documented.   STRESS TESTS  NM MYOCAR MULTI W/SPECT W 10/20/2010   ECHOCARDIOGRAM  ECHOCARDIOGRAM COMPLETE 02/19/2023  Narrative ECHOCARDIOGRAM REPORT    Patient Name:   [name] [name] Date of Exam: 02/19/2023 Medical Rec #:  [id number]      Height:       74.0 in Accession #:    [id number]     Weight:       205.2 lb Date of Birth:  1945-12-16     BSA:          2.197 m Patient Age:    77 years       BP:  Cardiology Office Note    Date:  04/27/2023  ID:  [name] [name], DOB Nov 10, 1945, MRN [id number] PCP:  [name], MD  Cardiologist:  [name], MD  Electrophysiologist:  [name], MD   Chief Complaint: Hospital follow up   History of Present Illness: .    [name] [name] is a 77 y.o. male with visit-pertinent history of CAD s/p CABG in 2013 with ischemic cardiomyopathy-EF 20-25% prior to surgery, improved to 40-45% by 10/2012, now 30-35%, mild MR, PAD, DM and PAF on chronic anticoagulation s/p ablation. Reduction in EF led to ICD insertion in 2023.   Heart catheterization in 08/2013 showed patent LIMA to LAD, SVG-PLA, SVG-PDA.  Most recent heart catheterization in 11/2021 showed patent LIMA to LAD but occlusion of both SVG grafts.  Given wall motion abnormality on echocardiogram, viability study was recommended to help make decisions regarding complex atherectomy and intervention of the left circumflex that would involve stenting back of the left main and covering the long segment throughout the circumflex to treat multiple severe calcific lesions.  Cardiac MRI was pursued and showed EF 20% and severe thinning in the apical to basal lateral wall and apical inferior wall suggesting nonviability in these regions.  Therefore complex intervention was not pursued.  He was treated medically.  Given persistent reduction in EF he underwent BiV ICD insertion CRT-D on 04/30/2022.  In March 2024 he reported worsening shortness of breath.  Device interrogation showed possible fluid accumulation in atrial fibrillation.  Diuretic was increased and EKG showed underlying A-fib with ventricularly paced rhythm and PVCs.  He presented for scheduled outpatient cardioversion on 10/16/2022 and was successfully cardioverted to a paced BiV rhythm.  He was seen by Dr. [name] on 10/27/2022 and was back in afib, he was started on amiodarone.  Cardiology was consulted in 10/2022 while patient was admitted in  setting of mechanical fall resulting in subdural hematoma 4 mm without any mass effect.  After discharge he fell again, he resumed Eliquis on 11/24/22. On 12/23/2022 he presented for DCCV which was successful.  On 01/19/23 he underwent afib ablation with Dr. [name], on follow up with afib clinic on 8/20 he reported no further afib. He was last seen in clinic on 02/26/23 by Dr. [name], it was noted that he had lost 50 lbs over the past year, he had an abnormal cologuard test with his PCP.   On 04/17/23 patient underwent colonoscopy, was found to have multiple scattered diverticula as well as 3 polyps that were clipped with cold snare's.  Following he had normal bowel movements until 2 days after colonoscopy when he noticed bright red blood from his rectum during bowel movements.  He presented to the ED on 04/19/2023, blood pressures were soft and stools positive for blood.  His hemoglobin at baseline is around 11, decreased to 8.3.  He received 1 unit of packed red blood cells, hemoglobin proved to 9.4.  He underwent repeat colonoscopy while inpatient showing a single post polypectomy ulcer in the cecum.  He had multiple clips placed at previous polypectomy.  While inpatient he was found to be orthostatic, he was given a 500 mL fluid bolus, his Lasix was held while inpatient.  He was started on midodrine 5 mg 3 times daily.  His Coreg was discontinued, he was instructed to resume Eliquis on 04/26/2023.  On 04/22/2023 he had a device check which showed 0% A-fib burden, OptiVol thoracic impedance suggested possible fluid accumulation starting 10/3 and  Cardiology Office Note    Date:  04/27/2023  ID:  [name] [name], DOB Nov 10, 1945, MRN [id number] PCP:  [name], MD  Cardiologist:  [name], MD  Electrophysiologist:  [name], MD   Chief Complaint: Hospital follow up   History of Present Illness: .    [name] [name] is a 77 y.o. male with visit-pertinent history of CAD s/p CABG in 2013 with ischemic cardiomyopathy-EF 20-25% prior to surgery, improved to 40-45% by 10/2012, now 30-35%, mild MR, PAD, DM and PAF on chronic anticoagulation s/p ablation. Reduction in EF led to ICD insertion in 2023.   Heart catheterization in 08/2013 showed patent LIMA to LAD, SVG-PLA, SVG-PDA.  Most recent heart catheterization in 11/2021 showed patent LIMA to LAD but occlusion of both SVG grafts.  Given wall motion abnormality on echocardiogram, viability study was recommended to help make decisions regarding complex atherectomy and intervention of the left circumflex that would involve stenting back of the left main and covering the long segment throughout the circumflex to treat multiple severe calcific lesions.  Cardiac MRI was pursued and showed EF 20% and severe thinning in the apical to basal lateral wall and apical inferior wall suggesting nonviability in these regions.  Therefore complex intervention was not pursued.  He was treated medically.  Given persistent reduction in EF he underwent BiV ICD insertion CRT-D on 04/30/2022.  In March 2024 he reported worsening shortness of breath.  Device interrogation showed possible fluid accumulation in atrial fibrillation.  Diuretic was increased and EKG showed underlying A-fib with ventricularly paced rhythm and PVCs.  He presented for scheduled outpatient cardioversion on 10/16/2022 and was successfully cardioverted to a paced BiV rhythm.  He was seen by Dr. [name] on 10/27/2022 and was back in afib, he was started on amiodarone.  Cardiology was consulted in 10/2022 while patient was admitted in  setting of mechanical fall resulting in subdural hematoma 4 mm without any mass effect.  After discharge he fell again, he resumed Eliquis on 11/24/22. On 12/23/2022 he presented for DCCV which was successful.  On 01/19/23 he underwent afib ablation with Dr. [name], on follow up with afib clinic on 8/20 he reported no further afib. He was last seen in clinic on 02/26/23 by Dr. [name], it was noted that he had lost 50 lbs over the past year, he had an abnormal cologuard test with his PCP.   On 04/17/23 patient underwent colonoscopy, was found to have multiple scattered diverticula as well as 3 polyps that were clipped with cold snare's.  Following he had normal bowel movements until 2 days after colonoscopy when he noticed bright red blood from his rectum during bowel movements.  He presented to the ED on 04/19/2023, blood pressures were soft and stools positive for blood.  His hemoglobin at baseline is around 11, decreased to 8.3.  He received 1 unit of packed red blood cells, hemoglobin proved to 9.4.  He underwent repeat colonoscopy while inpatient showing a single post polypectomy ulcer in the cecum.  He had multiple clips placed at previous polypectomy.  While inpatient he was found to be orthostatic, he was given a 500 mL fluid bolus, his Lasix was held while inpatient.  He was started on midodrine 5 mg 3 times daily.  His Coreg was discontinued, he was instructed to resume Eliquis on 04/26/2023.  On 04/22/2023 he had a device check which showed 0% A-fib burden, OptiVol thoracic impedance suggested possible fluid accumulation starting 10/3 and  Cardiology Office Note    Date:  04/27/2023  ID:  [name] [name], DOB Nov 10, 1945, MRN [id number] PCP:  [name], MD  Cardiologist:  [name], MD  Electrophysiologist:  [name], MD   Chief Complaint: Hospital follow up   History of Present Illness: .    [name] [name] is a 77 y.o. male with visit-pertinent history of CAD s/p CABG in 2013 with ischemic cardiomyopathy-EF 20-25% prior to surgery, improved to 40-45% by 10/2012, now 30-35%, mild MR, PAD, DM and PAF on chronic anticoagulation s/p ablation. Reduction in EF led to ICD insertion in 2023.   Heart catheterization in 08/2013 showed patent LIMA to LAD, SVG-PLA, SVG-PDA.  Most recent heart catheterization in 11/2021 showed patent LIMA to LAD but occlusion of both SVG grafts.  Given wall motion abnormality on echocardiogram, viability study was recommended to help make decisions regarding complex atherectomy and intervention of the left circumflex that would involve stenting back of the left main and covering the long segment throughout the circumflex to treat multiple severe calcific lesions.  Cardiac MRI was pursued and showed EF 20% and severe thinning in the apical to basal lateral wall and apical inferior wall suggesting nonviability in these regions.  Therefore complex intervention was not pursued.  He was treated medically.  Given persistent reduction in EF he underwent BiV ICD insertion CRT-D on 04/30/2022.  In March 2024 he reported worsening shortness of breath.  Device interrogation showed possible fluid accumulation in atrial fibrillation.  Diuretic was increased and EKG showed underlying A-fib with ventricularly paced rhythm and PVCs.  He presented for scheduled outpatient cardioversion on 10/16/2022 and was successfully cardioverted to a paced BiV rhythm.  He was seen by Dr. [name] on 10/27/2022 and was back in afib, he was started on amiodarone.  Cardiology was consulted in 10/2022 while patient was admitted in  setting of mechanical fall resulting in subdural hematoma 4 mm without any mass effect.  After discharge he fell again, he resumed Eliquis on 11/24/22. On 12/23/2022 he presented for DCCV which was successful.  On 01/19/23 he underwent afib ablation with Dr. [name], on follow up with afib clinic on 8/20 he reported no further afib. He was last seen in clinic on 02/26/23 by Dr. [name], it was noted that he had lost 50 lbs over the past year, he had an abnormal cologuard test with his PCP.   On 04/17/23 patient underwent colonoscopy, was found to have multiple scattered diverticula as well as 3 polyps that were clipped with cold snare's.  Following he had normal bowel movements until 2 days after colonoscopy when he noticed bright red blood from his rectum during bowel movements.  He presented to the ED on 04/19/2023, blood pressures were soft and stools positive for blood.  His hemoglobin at baseline is around 11, decreased to 8.3.  He received 1 unit of packed red blood cells, hemoglobin proved to 9.4.  He underwent repeat colonoscopy while inpatient showing a single post polypectomy ulcer in the cecum.  He had multiple clips placed at previous polypectomy.  While inpatient he was found to be orthostatic, he was given a 500 mL fluid bolus, his Lasix was held while inpatient.  He was started on midodrine 5 mg 3 times daily.  His Coreg was discontinued, he was instructed to resume Eliquis on 04/26/2023.  On 04/22/2023 he had a device check which showed 0% A-fib burden, OptiVol thoracic impedance suggested possible fluid accumulation starting 10/3 and

## 2023-04-26 NOTE — Telephone Encounter (Signed)
Communication noted.  

## 2023-04-27 ENCOUNTER — Encounter: Payer: Self-pay | Admitting: Physician Assistant

## 2023-04-27 ENCOUNTER — Encounter (HOSPITAL_COMMUNITY)
Admission: RE | Admit: 2023-04-27 | Discharge: 2023-04-27 | Disposition: A | Discharge status: Home / Self Care | Payer: Medicare Other | Source: Ambulatory Visit | Attending: Internal Medicine

## 2023-04-27 ENCOUNTER — Ambulatory Visit (INDEPENDENT_AMBULATORY_CARE_PROVIDER_SITE_OTHER): Payer: Medicare Other | Admitting: Cardiology

## 2023-04-27 VITALS — BP 110/50 | HR 70 | Ht 74.0 in | Wt 210.0 lb

## 2023-04-27 DIAGNOSIS — Z951 Presence of aortocoronary bypass graft: Secondary | ICD-10-CM | POA: Insufficient documentation

## 2023-04-27 DIAGNOSIS — I255 Ischemic cardiomyopathy: Secondary | ICD-10-CM | POA: Diagnosis not present

## 2023-04-27 DIAGNOSIS — I5022 Chronic systolic (congestive) heart failure: Secondary | ICD-10-CM | POA: Diagnosis not present

## 2023-04-27 DIAGNOSIS — G4733 Obstructive sleep apnea (adult) (pediatric): Secondary | ICD-10-CM | POA: Insufficient documentation

## 2023-04-27 DIAGNOSIS — I4819 Other persistent atrial fibrillation: Secondary | ICD-10-CM | POA: Diagnosis not present

## 2023-04-27 DIAGNOSIS — K625 Hemorrhage of anus and rectum: Secondary | ICD-10-CM | POA: Insufficient documentation

## 2023-04-27 DIAGNOSIS — Z79899 Other long term (current) drug therapy: Secondary | ICD-10-CM | POA: Diagnosis not present

## 2023-04-27 DIAGNOSIS — Z95810 Presence of automatic (implantable) cardiac defibrillator: Secondary | ICD-10-CM | POA: Diagnosis not present

## 2023-04-27 DIAGNOSIS — D6869 Other thrombophilia: Secondary | ICD-10-CM | POA: Insufficient documentation

## 2023-04-27 DIAGNOSIS — I4811 Longstanding persistent atrial fibrillation: Secondary | ICD-10-CM | POA: Insufficient documentation

## 2023-04-27 MED ORDER — MIDODRINE HCL 5 MG PO TABS: 5.0000 mg | ORAL_TABLET | ORAL | 2 refills | Status: DC | PRN

## 2023-04-27 NOTE — Progress Notes (Signed)
Daily Session Note  Patient Details  Name: [name] [name] MRN: [id number] Date of Birth: 07-01-1945 Referring Provider:   Flowsheet Row CARDIAC REHAB PHASE II ORIENTATION from 03/17/2023 in [facility] CARDIAC REHABILITATION  Referring Provider Zoila Shutter MD       Encounter Date: 04/27/2023  Check In:  Session Check In - 04/27/23 1030       Check-In   Supervising physician immediately available to respond to emergencies See telemetry face sheet for immediately available MD    Location [facility]    Staff Present Ross Ludwig, BS, Exercise Physiologist;Jessica Juanetta Gosling, MA, RCEP, CCRP, CCET    Virtual Visit No    Medication changes reported     No    Fall or balance concerns reported    No    Tobacco Cessation No Change    Warm-up and Cool-down Performed on first and last piece of equipment    Resistance Training Performed Yes    VAD Patient? No    PAD/SET Patient? No      Pain Assessment   Currently in Pain? No/denies    Pain Score 0-No pain    Multiple Pain Sites No             Capillary Blood Glucose: No results found for this or any previous visit (from the past 24 hour(s)).    Social History   Tobacco Use  Smoking Status Former   Types: Cigars   Quit date: 09/07/2011   Years since quitting: 11.6  Smokeless Tobacco Former   Quit date: 02/28/2012  Tobacco Comments   Former smoker 02/16/23    Goals Met:  Independence with exercise equipment Exercise tolerated well No report of concerns or symptoms today Strength training completed today  Goals Unmet:  Not Applicable  Comments: Pt able to follow exercise prescription today without complaint.  Will continue to monitor for progression.

## 2023-04-27 NOTE — Patient Instructions (Addendum)
Medication Instructions:  INCREASE LASIX TO 40 MG TWICE A DAY FOR 4 DAYS THEN BACK TO REGULAR DOSAGE.  TAKE MIDODRINE AS NEEDED FOR SYSTOLIC BLOOD PRESSURE <95.  *If you need a refill on your cardiac medications before your next appointment, please call your pharmacy*   Lab Work: BNP,BMET AND TSH TODAY AND CBC, BMET, AND BNP IN 1 WEEK. If you have labs (blood work) drawn today and your tests are completely normal, you will receive your results only by: MyChart Message (if you have MyChart) OR A paper copy in the mail If you have any lab test that is abnormal or we need to change your treatment, we will call you to review the results.   Testing/Procedures: NO TESTING   Follow-Up: At [facility], you and your health needs are our priority.  As part of our continuing mission to provide you with exceptional heart care, we have created designated Provider Care Teams.  These Care Teams include your primary Cardiologist (physician) and Advanced Practice Providers (APPs -  Physician Assistants and Nurse Practitioners) who all work together to provide you with the care you need, when you need it.    Your next appointment:   2 week(s)  Provider:   [name], NP Then, [name], MD will plan to see you again in 2-3 month(s).

## 2023-04-28 LAB — BASIC METABOLIC PANEL
BUN/Creatinine Ratio: 13 (ref 10–24)
BUN: 14 mg/dL (ref 8–27)
CO2: 23 mmol/L (ref 20–29)
Calcium: 8.9 mg/dL (ref 8.6–10.2)
Chloride: 101 mmol/L (ref 96–106)
Creatinine, Ser: 1.12 mg/dL (ref 0.76–1.27)
Glucose: 197 mg/dL — ABNORMAL HIGH (ref 70–99)
Potassium: 4.5 mmol/L (ref 3.5–5.2)
Sodium: 141 mmol/L (ref 134–144)
eGFR: 67 mL/min/{1.73_m2} (ref >59)

## 2023-04-28 LAB — TSH: TSH: 2.19 u[IU]/mL (ref 0.450–4.500)

## 2023-04-28 LAB — BRAIN NATRIURETIC PEPTIDE: BNP: 571.5 pg/mL — ABNORMAL HIGH (ref 0.0–100.0)

## 2023-04-29 ENCOUNTER — Encounter (HOSPITAL_COMMUNITY)
Admission: RE | Admit: 2023-04-29 | Discharge: 2023-04-29 | Disposition: A | Discharge status: Home / Self Care | Payer: Medicare Other | Source: Ambulatory Visit | Attending: Internal Medicine | Admitting: Internal Medicine

## 2023-04-29 ENCOUNTER — Telehealth: Payer: Self-pay

## 2023-04-29 DIAGNOSIS — Z95810 Presence of automatic (implantable) cardiac defibrillator: Secondary | ICD-10-CM | POA: Diagnosis not present

## 2023-04-29 DIAGNOSIS — I5022 Chronic systolic (congestive) heart failure: Secondary | ICD-10-CM | POA: Diagnosis not present

## 2023-04-29 NOTE — Telephone Encounter (Signed)
Called patient advised of below they verbalized understanding.

## 2023-04-29 NOTE — Progress Notes (Signed)
Daily Session Note  Patient Details  Name: [name] [name] MRN: [id number] Date of Birth: 1946/06/19 Referring Provider:   Flowsheet Row CARDIAC REHAB PHASE II ORIENTATION from 03/17/2023 in [facility] CARDIAC REHABILITATION  Referring Provider Zoila Shutter MD       Encounter Date: 04/29/2023  Check In:  Session Check In - 04/29/23 1030       Check-In   Supervising physician immediately available to respond to emergencies See telemetry face sheet for immediately available MD    Location [facility]    Staff Present Ross Ludwig, BS, Exercise Physiologist;Debra Laural Benes, RN, BSN    Virtual Visit No    Medication changes reported     No    Fall or balance concerns reported    No    Tobacco Cessation No Change    Warm-up and Cool-down Performed on first and last piece of equipment    Resistance Training Performed Yes    VAD Patient? No    PAD/SET Patient? No      Pain Assessment   Currently in Pain? No/denies    Pain Score 0-No pain    Multiple Pain Sites No             Capillary Blood Glucose: No results found for this or any previous visit (from the past 24 hour(s)).    Social History   Tobacco Use  Smoking Status Former   Types: Cigars   Quit date: 09/07/2011   Years since quitting: 11.6  Smokeless Tobacco Former   Quit date: 02/28/2012  Tobacco Comments   Former smoker 02/16/23    Goals Met:  Independence with exercise equipment Exercise tolerated well No report of concerns or symptoms today Strength training completed today  Goals Unmet:  Not Applicable  Comments: Pt able to follow exercise prescription today without complaint.  Will continue to monitor for progression.

## 2023-04-29 NOTE — Telephone Encounter (Signed)
-----   Message from [name] sent at 04/28/2023  5:52 PM EDT ----- Please let [name] [name] know that his kidney function is normal and his electrolytes are normal, no need for potassium supplement at this time.  His thyroid function is normal.  His BNP is elevated, would recommend that he take his lasix 40 mg twice daily for four days as discussed during appointment. We will recheck kidney function, electrolytes and BNP next week.

## 2023-04-30 ENCOUNTER — Encounter (INDEPENDENT_AMBULATORY_CARE_PROVIDER_SITE_OTHER): Payer: Medicare Other | Admitting: Audiology

## 2023-05-03 ENCOUNTER — Ambulatory Visit (INDEPENDENT_AMBULATORY_CARE_PROVIDER_SITE_OTHER): Payer: Medicare Other

## 2023-05-03 ENCOUNTER — Encounter (INDEPENDENT_AMBULATORY_CARE_PROVIDER_SITE_OTHER): Payer: Medicare Other

## 2023-05-03 ENCOUNTER — Telehealth: Payer: Self-pay

## 2023-05-03 DIAGNOSIS — Z95810 Presence of automatic (implantable) cardiac defibrillator: Secondary | ICD-10-CM | POA: Insufficient documentation

## 2023-05-03 DIAGNOSIS — I5022 Chronic systolic (congestive) heart failure: Secondary | ICD-10-CM | POA: Diagnosis not present

## 2023-05-03 DIAGNOSIS — I10 Essential (primary) hypertension: Secondary | ICD-10-CM | POA: Insufficient documentation

## 2023-05-03 DIAGNOSIS — I2510 Atherosclerotic heart disease of native coronary artery without angina pectoris: Secondary | ICD-10-CM | POA: Insufficient documentation

## 2023-05-03 DIAGNOSIS — D6869 Other thrombophilia: Secondary | ICD-10-CM | POA: Insufficient documentation

## 2023-05-03 DIAGNOSIS — I255 Ischemic cardiomyopathy: Secondary | ICD-10-CM

## 2023-05-03 DIAGNOSIS — I4819 Other persistent atrial fibrillation: Secondary | ICD-10-CM | POA: Insufficient documentation

## 2023-05-03 NOTE — Telephone Encounter (Signed)
Remote ICM transmission received.  Attempted call to patient regarding ICM remote transmission and left message to return call   

## 2023-05-03 NOTE — Progress Notes (Signed)
EPIC Encounter for ICM Monitoring  Patient Name: [name] [name] is a 77 y.o. male Date: 05/03/2023 Primary Care Physican: [name], MD Primary Cardiologist: [facility]        Electrophysiologist: [name] Bi-V Pacing: 99.6%         09/21/2022 Weight: 213 lbs 10/06/2022 Weight: 219 lbs  11/17/2022 Weight: 212.5 lbs 12/22/2022 Weight: 211 lbs 01/13/2023 Weight: 209 lbs   Clinical Status Since 22-Apr-2023 Time in AT/AF  0.0 hr/day (0.0%)                                                          Spoke with patient and heart failure questions reviewed.  Transmission results reviewed.  Pt asymptomatic for fluid accumulation.  He is feeling much better after having some problems following colonoscopy.     Optivol thoracic impedance suggesting fluid levels returned to normal after taking Lasix 40 mg twice a day x 4 days as instructed on 10/29 OV.    Prescribed:  Furosemide 40 mg take 1 tablet(s) (40 mg total) by mouth daily.  Take 1 tablet by mouth in the morning and 1 tablet by mouth in the afternoon as needed.   Labs: 04/27/2023 Creatinine 1.12, BUN 14, Potassium 4.5, Sodium 141, GFR 67, BNP 571.5 04/20/2023 Creatinine 1.07, BUN 12, Potassium 3.5, Sodium 134, GFR >60  04/17/2023 Creatinine 0.92, BUN 20, Potassium 3.7, Sodium 136, GFR >60  04/09/2023 Creatinine 1.06, BUN 22, Potassium 3.5, Sodium 136, GFR >60  01/05/2023 Creatinine 1.27, BUN 22, Potassium 4.2, Sodium 142, GFR 58 A complete set of results can be found in Results Review.   Recommendations:   No changes and encouraged to call if experiencing any fluid symptoms.   Follow-up plan: ICM clinic phone appointment on 05/17/2023.   91 day device clinic remote transmission 05/03/2023.     EP/Cardiology Office Visits:  Recall 08/02/2023 with Dr [name].   Recall 08/25/2023 with Dr [name].   Copy of ICM check sent to Dr. [name].     3 month ICM trend: 05/03/2023.    12-14 Month ICM trend:     Karie Soda, RN 05/03/2023 12:49  PM

## 2023-05-03 NOTE — Progress Notes (Addendum)
Referring Provider: [name], MD Primary Care Physician:  [name] Pandy, MD Primary GI Physician: Dr. Jena [name]  Chief Complaint  Patient presents with   Follow-up    Hospital follow up.      HPI:   [name] is a 77 y.o. 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   I last saw patient in the office 03/03/2023 for anemia, positive Cologuard, early satiety, unintentional weight loss.  Noted mild anemia with hemoglobin in the 11-12 range since October 2023.  Iron panel in June 2023 did show a low iron saturation, low normal B12.  He had recently completed Cologuard which was positive and also recently had heme positive stool.  He also reported early satiety, 20 pound unintentional weight loss since February though some of his weight loss may have been related to volume overload as he lost some weight after doubling Lasix.  Denied overt GI bleeding or other significant GI symptoms.  Plan to update labs and arrange EGD and colonoscopy.  I also ordered a chest CT due to recent cardiac CT with right hilar and subcarinal lymphadenopathy.  CT chest with nonspecific mildly enlarged right hilar and subcarinal lymph nodes.  No suspicious pulmonary nodules.  He did have possible small pulmonary nodules.  Recommended repeat CT in 6/12 months.  Advised to see PCP for this.  EGD 04/14/2023: Probable Candida esophagitis s/p KOH brushing, abnormal gastric mucosa biopsied.  Colonoscopy 04/14/2023: Diverticulosis in sigmoid and descending colon, three 4-6 mm polyps in the rectosigmoid colon, ascending colon, cecum resected and retrieved, 20 x 10 mm polyp in the rectosigmoid colon resected with location tattooed and clipped.  Patient was admitted to the hospital on  04/17/2023 with large-volume painless rectal bleeding.  Repeat colonoscopy 10/20 showing two 2-3 mm polyps in the transverse and ascending colon not removed, single post polypectomy ulcer in the cecum s/p clipping, single post polypectomy ulcer in the ascending colon, 2 clips in the rectosigmoid colon at site of previous polypectomy s/p 2 additional clips placed. During admission, hemoglobin dropped as low as 8.3.  He received 1 unit PRBCs and hemoglobin stabilized, 9.6 upon discharge.  Prior EGD pathology resulted showing mild nonspecific reactive gastropathy, negative for H. pylori.  KOH positive.  He was started on Diflucan and advised to complete a 21-day course.  Colonoscopy pathology from 10/16 resulted showing tubular adenomas and a single fragment of invasive moderately differentiated adenocarcinoma from the rectosigmoid polypectomy.  Also with other fragments of hyperplastic polyps and tubular adenomas.  He was referred to [name] surgery.   Appointment with Dr. Romie [name] on 05/04/2023.  Noted patient was high risk for surgery as a general recommendation would be sigmoidectomy.  Planned to discuss patient's risk with cardiology.  Stated minimally invasive colon surgery was mostly performed at [name] and she was not sure if anesthesiologist there would be comfortable with this patient.  If not, patient may be better served at a university or tertiary institution as minimally invasive colon cancer surgeries are not done at [name].  They also discussed the possibility of waiting and performing a follow-up colonoscopy in 6 months to see if he had had any regrowth given his high risk versus performing endoscopic mucosal resection of the biopsy site.  Stated once she heard back from cardiologist and discussed with  [name] anesthesiologist, would have additional recommendations.    Today:  Weight is fairly stable since I saw him last. Continues with lack of appetite, but no nausea,  vomiting, abdominal pain, heartburn, dysphagia. Hasn't tried adding protein shakes to supplement intake.   Bowels are moving well.   Anemia:  No recurrent bleeding since repeat colonoscopy. No melena.  Just started taking 2 iron gummies daily within the last couple of weeks.  Has no energy. Felt well prior to rectal bleeding episode but hasn't gotten back to normal yet.   [name] wants to wait for repeat colonoscopy rather than undergoing surgery. Wants to build strength up before undergoing surgery.    Past Medical History:  Diagnosis Date   AICD (automatic cardioverter/defibrillator) present    Arthritis    Knee L - probably   Atrial fibrillation (HCC)    BPH (benign prostatic hyperplasia)    CAD (coronary artery disease) 07/06/2011   CHF (congestive heart failure) (HCC)    Coronary artery disease    Diabetes mellitus    Frequency of urination    Heart failure (HCC)    High cholesterol    History of nuclear stress test 09/2010   dipyridamole; mild perfusion defect due to attenuation with mild superimposed ischemia in apical septal, apical, apical inferior & apical lateral regions; rest LV enlarged in size; prominent gut uptake in infero-apical region; no significant ischemia demonstrated; low risk scan    HNP (herniated nucleus pulposus), lumbar    Hypertension    Ischemic cardiomyopathy    LV dysfunction 07/06/2011   Nocturia    Right bundle branch block    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 Wayland [name]   Sleep apnea    sleep study 10/2010- AHI during total sleep 32.1/hr and during REM 62.3/hr (severe sleep apnea)unable to tolerate c pap   Spinal stenosis of lumbar region    Stroke [name])    [place]     Past Surgical History:  Procedure Laterality Date   ABDOMINAL AORTOGRAM W/LOWER EXTREMITY Left 11/14/2020   Procedure: ABDOMINAL AORTOGRAM W/LOWER EXTREMITY;  Surgeon: [name] Shelling, MD;  Location: [name];  Service: Cardiovascular;  Laterality: Left;   ABDOMINAL AORTOGRAM W/LOWER EXTREMITY N/A 01/02/2021   Procedure: ABDOMINAL AORTOGRAM W/LOWER EXTREMITY;  Surgeon: [name] Shelling, MD;  [name];  Service: Cardiovascular;  Laterality: N/A;   AMPUTATION Left 03/20/2021   Procedure: Left foot 5th ray amputation;  Surgeon: Toni Arthurs, MD;  Location: [facility] OR;  Service: Orthopedics;  Laterality: Left;   ATRIAL FIBRILLATION ABLATION N/A 01/19/2023   Procedure: ATRIAL FIBRILLATION ABLATION;  Surgeon: [name], MD;  [name];  Service: Cardiovascular;  Laterality: N/A;   BIOPSY  04/14/2023   Procedure: BIOPSY;  Surgeon: Corbin Ade, MD;  [facility];  Service: Endoscopy;;   BIV ICD INSERTION CRT-D N/A 04/30/2022   Procedure: BIV ICD INSERTION CRT-D;  Surgeon: [name], MD;  [facility]: [name];  Service: Cardiovascular;  Laterality: N/A;   CARDIAC CATHETERIZATION  01/2011   ischemic cardiomyopathy 30-35%, multivessel CAD (Dr. Bishop Limbo)    CARDIAC CATHETERIZATION  09/13/2015   Procedure: Right/Left Heart Cath and Coronary/Graft Angiography;  Surgeon: [name], MD;  [name];  Service: Cardiovascular;;   CARDIOVERSION N/A 05/16/2019   Procedure: CARDIOVERSION;  Surgeon: [name],  Lisette Abu, MD;  Location: [name];  Service: Cardiovascular;  Laterality: N/A;   CARDIOVERSION N/A 10/16/2022   Procedure: CARDIOVERSION;  Surgeon: Thurmon Fair, MD;  [name];  Service: Cardiovascular;  Laterality: N/A;   CARDIOVERSION N/A 12/16/2022   Procedure: CARDIOVERSION;  Surgeon: Little Ishikawa, MD;  Location: [name];  Service: Cardiovascular;  Laterality: N/A;   COLONOSCOPY WITH PROPOFOL N/A 04/14/2023   Procedure: COLONOSCOPY WITH PROPOFOL;  Surgeon: Corbin Ade, MD;  [facility];  Service:  Endoscopy;  Laterality: N/A;  11:00am, asa 3/4   COLONOSCOPY WITH PROPOFOL N/A 04/18/2023   Procedure: COLONOSCOPY WITH PROPOFOL;  Surgeon: Dolores Frame, MD;  [facility];  Service: Gastroenterology;  Laterality: N/A;   Colonscopy     CORONARY ARTERY BYPASS GRAFT  08/03/2011   Procedure: CORONARY ARTERY BYPASS GRAFTING (CABG);  Surgeon: Alleen Borne, MD;  Location: [facility] OR;  Service: Open Heart Surgery;  Laterality: N/A;  CABG times three using right saphenous vein and left mammary artery usisng endoscope.   ESOPHAGEAL BRUSHING  04/14/2023   Procedure: ESOPHAGEAL BRUSHING;  Surgeon: Corbin Ade, MD;  [facility];  Service: Endoscopy;;   ESOPHAGOGASTRODUODENOSCOPY (EGD) WITH PROPOFOL N/A 04/14/2023   Procedure: ESOPHAGOGASTRODUODENOSCOPY (EGD) WITH PROPOFOL;  Surgeon: Corbin Ade, MD;  [facility];  Service: Endoscopy;  Laterality: N/A;   HEMOSTASIS CLIP PLACEMENT  04/14/2023   Procedure: HEMOSTASIS CLIP PLACEMENT;  Surgeon: Corbin Ade, MD;  [facility];  Service: Endoscopy;;   LEFT HEART CATH AND CORS/GRAFTS ANGIOGRAPHY N/A 12/15/2021   Procedure: LEFT HEART CATH AND CORS/GRAFTS ANGIOGRAPHY;  Surgeon: Tonny Bollman, MD;  Location: [facility] INVASIVE [name];  Service: Cardiovascular;  Laterality: N/A;   LEFT HEART CATHETERIZATION WITH CORONARY/GRAFT ANGIOGRAM N/A 09/20/2013   Procedure: LEFT HEART CATHETERIZATION WITH Isabel Caprice;  Surgeon: [name], MD;  Location: [name];  Service: Cardiovascular;  Laterality: N/A;   Lower Extremity Arterial Doppler  03/16/2013   bilat ABIs demonstated normal values; R runoff - posterior tibial & anterior tibial arteries occluded; L runoff - peroneal & posterior tibial arteries occluded, anterior tibial artery appears occluded   LUMBAR LAMINECTOMY/DECOMPRESSION MICRODISCECTOMY N/A 09/12/2014   Procedure: LUMBAR DECOMPRESSION L3-L4, L4-L5, MICRODISCECTOMY L4-L5;  Surgeon:  Jene Every, MD;  Location: [facility];  Service: Orthopedics;  Laterality: N/A;   PERIPHERAL VASCULAR BALLOON ANGIOPLASTY  11/14/2020   Procedure: PERIPHERAL VASCULAR BALLOON ANGIOPLASTY;  Surgeon: [name] Shelling, MD;  [name];  Service: Cardiovascular;;   PERIPHERAL VASCULAR BALLOON ANGIOPLASTY Left 01/02/2021   Procedure: PERIPHERAL VASCULAR BALLOON ANGIOPLASTY;  Surgeon: [name] Shelling, MD;  [name];  Service: Cardiovascular;  Laterality: Left;  Failed PTA   Pilonidal Cyst removed     POLYPECTOMY  04/14/2023   Procedure: POLYPECTOMY INTESTINAL;  Surgeon: Corbin Ade, MD;  [facility];  Service: Endoscopy;;   SCLEROTHERAPY  04/14/2023   Procedure: [name];  Surgeon: Corbin Ade, MD;  [facility];  Service: Endoscopy;;   SUBMUCOSAL LIFTING INJECTION  04/14/2023   Procedure: SUBMUCOSAL LIFTING INJECTION;  Surgeon: Corbin Ade, MD;  [facility];  Service: Endoscopy;;   SUBMUCOSAL TATTOO INJECTION  04/14/2023   Procedure: SUBMUCOSAL TATTOO INJECTION;  Surgeon: Corbin Ade, MD;  [facility];  Service: Endoscopy;;   TRANSTHORACIC ECHOCARDIOGRAM  11/10/2012   EF 40-45%, mild LVH, mild hypokinesis of  anteroseptal myocardium, grade 1 diastolic dysfunction; mild MR & calcifed mitral annulus; LA mild-mod dilated; RA mildly dilated    Current Outpatient Medications  Medication Sig Dispense Refill   acetaminophen (TYLENOL) 500 MG tablet Take 500-1,000 mg by mouth every 6 (six) hours as needed (for pain.).     amiodarone (PACERONE) 200 MG tablet Take 0.5 tablets (100 mg total) by mouth at bedtime. Take 0.5 tablets (100 mg total) once daily.     apixaban (ELIQUIS) 5 MG TABS tablet Take 1 tablet (5 mg total) by mouth 2 (two) times daily.     empagliflozin (JARDIANCE) 25 MG TABS tablet Take 1 tablet (25 mg total) by mouth daily. 90 tablet 3   fluconazole (DIFLUCAN) 100 MG tablet Take 1  tablet (100 mg total) by mouth daily for 20 days. 20 tablet 0   furosemide (LASIX) 40 MG tablet Take 1 tablet (40 mg total) by mouth daily. Take 1  tablet by mouth in the morning and 1 tablet by mouth in the afternoon as needed.     gabapentin (NEURONTIN) 300 MG capsule Take 600 mg by mouth at bedtime.     metFORMIN (GLUCOPHAGE-XR) 500 MG 24 hr tablet Take 1,000 mg by mouth 2 (two) times daily.     midodrine (PROAMATINE) 5 MG tablet Take 1 tablet (5 mg total) by mouth as needed. For systolic blood pressure <95 30 tablet 2   ONETOUCH VERIO test strip 1 each daily.     rosuvastatin (CRESTOR) 10 MG tablet Take 1 tablet (10 mg total) by mouth every other day. 50 tablet 3   polyethylene glycol (MIRALAX / GLYCOLAX) 17 g packet Take 17 g by mouth daily as needed for mild constipation. (Patient not taking: Reported on 05/05/2023)     senna-docusate (SENOKOT-S) 8.6-50 MG tablet Take 1 tablet by mouth at bedtime as needed for mild constipation. (Patient not taking: Reported on 05/05/2023)     No current facility-administered medications for this visit.    Allergies as of 05/05/2023 - Review Complete 05/05/2023  Allergen Reaction Noted   Entresto [sacubitril-valsartan] Other (See Comments) 02/25/2017   Sacubitril Other (See Comments) 10/05/2017    Family History  Problem Relation Age of Onset   Cancer Mother    Lung disease Father        & heart disease   Colon polyps Father    Colon cancer Sister    Sudden death Maternal Grandmother    Anesthesia problems Daughter     Social History   Socioeconomic History   Marital status: Widowed    Spouse name: Not on file   Number of children: 1   Years of education: Not on file   Highest education level: Not on file  Occupational History   Occupation: real Psychologist, occupational  Tobacco Use   Smoking status: Former    Types: Cigars    Quit date: 09/07/2011    Years since quitting: 11.6   Smokeless tobacco: Former    Quit date: 02/28/2012   Tobacco  comments:    Former smoker 02/16/23  Vaping Use   Vaping status: Never Used  Substance and Sexual Activity   Alcohol use: Not Currently    Alcohol/week: 1.0 - 2.0 standard drink of alcohol    Types: 1 - 2 Cans of beer per week   Drug use: No   Sexual activity: Not on file  Other Topics Concern   Not on file  Social History Narrative   ** Merged History Encounter **  Social Determinants of Health   Financial Resource Strain: Not on file  Food Insecurity: No Food Insecurity (04/17/2023)   Hunger Vital Sign    Worried About Running Out of Food in the Last Year: Never true    Ran Out of Food in the Last Year: Never true  Transportation Needs: No Transportation Needs (04/17/2023)   PRAPARE - Administrator, Civil Service (Medical): No    Lack of Transportation (Non-Medical): No  Physical Activity: Low Risk  (08/11/2022)   [facility]   PCARE Exercise SDOH    Exercise: Active Lifestyle Only    PCare Exercise SDOH: Not on file    PCare Exercise SDOH: Not on file  Stress: Not on file  Social Connections: Not on file    Review of Systems: Gen: Denies fever, chills, cold or flulike symptoms, presyncope, syncope. CV: Denies chest pain, palpitations. Resp: Denies dyspnea, cough.Marland Kitchen GI: See HPI  Heme: See HPI  Physical Exam: BP 103/60 (BP Location: Left Arm, Patient Position: Sitting, Cuff Size: Large)   Pulse 67   Temp 98 F (36.7 C) (Temporal)   Ht 6\' 2"  (1.88 m)   Wt 205 lb 12.8 oz (93.4 kg)   BMI 26.42 kg/m  General:   Alert and oriented. No distress noted. Pleasant and cooperative.  Head:  Normocephalic and atraumatic. Eyes:  Conjuctiva clear without scleral icterus. Heart:  S1, S2 present without murmurs appreciated. Lungs:  Clear to auscultation bilaterally. No wheezes, rales, or rhonchi. No distress.  Abdomen:  +BS, soft, non-tender and non-distended. No rebound or guarding. No HSM or masses  noted. Msk:  Symmetrical without gross deformities. Normal posture. Extremities:  With 1+ bilateral LE pitting edema.  Neurologic:  Alert and  oriented x4 Psych:  Normal mood and affect.    Assessment:  77 y.o. 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 During admission, Hgb dropped as low as 8.3. He received 1 unit PRBCs and hemoglobin stabilized, 9.6 upon discharge. He is presenting today for follow-up.   Iron Deficiency Anemia: Persistent, mild anemia with hemoglobin in the 11-12 range since October 2023. Likely multifactorial in the setting of CKD, IDA, low normal B12, and recently found to have a fragment of invasive moderately differentiated adenocarcinoma from a large 20 x 10 mm polyp removed from the rectosigmoid colon.  Subsequently developed post polypectomy bleeding with acute on chronic anemia requiring repeat colonoscopy on 10/20 as described above with resolution of rectal bleeding thereafter.  Hemoglobin stabilized at 9.6 upon discharge and he has not had repeat CBC since that time.  He does continue to endorse lack of energy.  Just darted over-the-counter iron Gummies within the last couple of weeks.  We will update CBC, iron panel, and B12.  Sigmoid colon cancer:  Saw Dr. Romie [name] on 05/04/2023 who noted patient was high risk for surgery.  At the general  recommendation, recommended sigmoidectomy, but plan  to discuss patient's risk with cardiology. Stated minimally invasive colon surgery was mostly performed at [facility] and she was not sure if anesthesiologist there would be comfortable.  If not, stated patient may be better served at a university or tertiary institution as minimally invasive colon cancer surgeries are not performed at [facility].  They also discussed the possibility of waiting and performing a follow-up colonoscopy in 6 months to see if he had any regrowth given high risk versus performing endoscopic mucosal resection of the biopsy site.  Stated when she heard back from cardiologist and discussed with [name] anesthesiologist, would have additional recommendations.   Notably, CT chest, Abd, Pelvis on file with no evidence of metastatic disease. He does have mildly enlarged right hilar and subcarinal lymph nodes but no suspicious pulmonary nodules with recommendations to consider repeat CT chest in 6-12 months.  Weight loss/lack of appetite:  May be related to miltimorbidity along with recent diagnosis of colon cancer and candida esophagitis, currently on diflucan for candida and seeing Dr. Romie [name] for consideration of surgical management of colon cancer. He has no associated abdominal pain, nausea, or vomiting.  Nothing significant on recent CT A/P with contrast.  Encouragingly, he has up 2 lbs since I saw him in September.  Discussed adding Remeron at bedtime, but patient prefers to monitor for now.  Encouraged that he add 1-2 protein shakes daily.  Candida Esophagitis:  Complete course of Diflucan as prescribed. Denies odynophagia/dysphagia.    Plan:  CBC, iron panel, B12 Follow-up with Dr. [name] on decision regarding definitive treatment of colon cancer.  Request patient to keep [place] updated on this. I will discuss the 2 polyps that were seen on repeat colonoscopy with Dr. Jena [name] to determine if repeat colonoscopy is needed  to go ahead and resent these polyps to ensure no underlying malignancy. Complete course of Diflucan as prescribed. Add 1-2 protein shakes daily that contain at least 30 g of protein per shake. Requested patient to continue to monitor his weight. Tentatively will plan to follow-up in 6 months, but can follow-up sooner if needed.   Ermalinda Memos, PA-C [name] 05/05/2023    Addendum: Per Dr. Jena [name], polyps remaining were minuscule.  We can get them when he comes for his 1 year follow-up TCS.   Ermalinda Memos, PA-C [facility]

## 2023-05-04 ENCOUNTER — Encounter (HOSPITAL_COMMUNITY): Payer: Medicare Other

## 2023-05-04 DIAGNOSIS — C187 Malignant neoplasm of sigmoid colon: Secondary | ICD-10-CM | POA: Diagnosis not present

## 2023-05-04 DIAGNOSIS — I739 Peripheral vascular disease, unspecified: Secondary | ICD-10-CM | POA: Diagnosis not present

## 2023-05-05 ENCOUNTER — Encounter (HOSPITAL_COMMUNITY): Payer: Self-pay | Admitting: *Deleted

## 2023-05-05 ENCOUNTER — Encounter: Payer: Self-pay | Admitting: Gastroenterology

## 2023-05-05 ENCOUNTER — Ambulatory Visit (INDEPENDENT_AMBULATORY_CARE_PROVIDER_SITE_OTHER): Payer: Medicare Other | Admitting: Gastroenterology

## 2023-05-05 ENCOUNTER — Telehealth (HOSPITAL_BASED_OUTPATIENT_CLINIC_OR_DEPARTMENT_OTHER): Payer: Self-pay | Admitting: *Deleted

## 2023-05-05 VITALS — BP 103/60 | HR 67 | Temp 98.0°F | Ht 74.0 in | Wt 205.8 lb

## 2023-05-05 DIAGNOSIS — C187 Malignant neoplasm of sigmoid colon: Secondary | ICD-10-CM | POA: Diagnosis not present

## 2023-05-05 DIAGNOSIS — R630 Anorexia: Secondary | ICD-10-CM

## 2023-05-05 DIAGNOSIS — D509 Iron deficiency anemia, unspecified: Secondary | ICD-10-CM

## 2023-05-05 DIAGNOSIS — I5022 Chronic systolic (congestive) heart failure: Secondary | ICD-10-CM

## 2023-05-05 DIAGNOSIS — B3781 Candidal esophagitis: Secondary | ICD-10-CM

## 2023-05-05 DIAGNOSIS — R634 Abnormal weight loss: Secondary | ICD-10-CM | POA: Diagnosis not present

## 2023-05-05 DIAGNOSIS — Z79899 Other long term (current) drug therapy: Secondary | ICD-10-CM | POA: Diagnosis not present

## 2023-05-05 NOTE — Telephone Encounter (Signed)
   Pre-operative Risk Assessment    Patient Name: [name]  DOB: 1945/10/05 MRN: [id number]      Request for Surgical Clearance    Procedure:   Colon Surgery  Date of Surgery:  Clearance TBD                                 Surgeon:  Dr. [name] Surgeon's Group or Practice Name:  [facility] Phone number:  [phone] Fax number:  [phone]   Type of Clearance Requested:   - Medical  - Pharmacy:  Hold Apixaban (Eliquis) Not Indicated.    Type of Anesthesia:  General    Additional requests/questions:    Last OVReather Littler, NP 04/27/2023 Upcoming OV: Dr. [name] 05/13/2023 Pre Op added to appt notes.   Signed, Emmit Pomfret   05/05/2023, 11:18 AM

## 2023-05-05 NOTE — Patient Instructions (Signed)
Please have labs completed at [facility].   Drink 1-2 protein shakes daily that contain 30g protein per shake.   Continue to monitor your weight.  Let me know if you would like to start something to help stimulate your appetite.  Follow-up with Dr. [name] on decision regarding definitive treatment of colon cancer. Please keep me updated on this.   We will place follow-up in 6 months, but we can follow-up sooner if needed.  Ermalinda Memos, PA-C [facility]

## 2023-05-05 NOTE — Progress Notes (Signed)
Cardiac Individual Treatment Plan  Patient Details  Name: [name] [name] MRN: [id number] Date of Birth: 06/14/1946 Referring Provider:   Flowsheet Row CARDIAC REHAB PHASE II ORIENTATION from 03/17/2023 in [facility] CARDIAC REHABILITATION  Referring Provider Zoila Shutter MD       Initial Encounter Date:  Flowsheet Row CARDIAC REHAB PHASE II ORIENTATION from 03/17/2023 in [place] CARDIAC REHABILITATION  Date 03/17/23       Visit Diagnosis: Heart failure, chronic systolic (HCC)  Patient's Home Medications on Admission:  Current Outpatient Medications:    acetaminophen (TYLENOL) 500 MG tablet, Take 500-1,000 mg by mouth every 6 (six) hours as needed (for pain.)., Disp: , Rfl:    amiodarone (PACERONE) 200 MG tablet, Take 0.5 tablets (100 mg total) by mouth at bedtime. Take 0.5 tablets (100 mg total) once daily., Disp: , Rfl:    apixaban (ELIQUIS) 5 MG TABS tablet, Take 1 tablet (5 mg total) by mouth 2 (two) times daily., Disp: , Rfl:    empagliflozin (JARDIANCE) 25 MG TABS tablet, Take 1 tablet (25 mg total) by mouth daily., Disp: 90 tablet, Rfl: 3   fluconazole (DIFLUCAN) 100 MG tablet, Take 1 tablet (100 mg total) by mouth daily for 20 days., Disp: 20 tablet, Rfl: 0   furosemide (LASIX) 40 MG tablet, Take 1 tablet (40 mg total) by mouth daily. Take 1  tablet by mouth in the morning and 1 tablet by mouth in the afternoon as needed., Disp: , Rfl:    gabapentin (NEURONTIN) 300 MG capsule, Take 600 mg by mouth at bedtime., Disp: , Rfl:    metFORMIN (GLUCOPHAGE-XR) 500 MG 24 hr tablet, Take 1,000 mg by mouth 2 (two) times daily., Disp: , Rfl:    midodrine (PROAMATINE) 5 MG tablet, Take 1 tablet (5 mg total) by mouth as needed. For systolic blood pressure <95, Disp: 30 tablet, Rfl: 2   polyethylene glycol (MIRALAX / GLYCOLAX) 17 g packet, Take 17 g by mouth daily as needed for mild constipation., Disp: , Rfl:    rosuvastatin (CRESTOR) 10 MG tablet, Take 1 tablet (10 mg total) by mouth  every other day., Disp: 50 tablet, Rfl: 3   senna-docusate (SENOKOT-S) 8.6-50 MG tablet, Take 1 tablet by mouth at bedtime as needed for mild constipation., Disp: , Rfl:   Past Medical History: Past Medical History:  Diagnosis Date   AICD (automatic cardioverter/defibrillator) present    Arthritis    Knee L - probably   Atrial fibrillation (HCC)    BPH (benign prostatic hyperplasia)    CAD (coronary artery disease) 07/06/2011   CHF (congestive heart failure) (HCC)    Coronary artery disease    Diabetes mellitus    Frequency of urination    Heart failure (HCC)    High cholesterol    History of nuclear stress test 09/2010   dipyridamole; mild perfusion defect due to attenuation with mild superimposed ischemia in apical septal, apical, apical inferior & apical lateral regions; rest LV enlarged in size; prominent gut uptake in infero-apical region; no significant ischemia demonstrated; low risk scan    HNP (herniated nucleus pulposus), lumbar    Hypertension    Ischemic cardiomyopathy    LV dysfunction 07/06/2011   Nocturia    Right bundle branch block    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 Wayland Salinas   Sleep apnea    sleep study 10/2010- AHI during total  sleep 32.1/hr and during REM 62.3/hr (severe sleep apnea)unable to tolerate c pap   Spinal stenosis of lumbar region    Stroke (HCC)    [place]     Tobacco Use: Social History   Tobacco Use  Smoking Status Former   Types: Cigars   Quit date: 09/07/2011   Years since quitting: 11.6  Smokeless Tobacco Former   Quit date: 02/28/2012  Tobacco Comments   Former smoker 02/16/23    Labs: Review Flowsheet  More data exists      Latest Ref Rng & Units 10/17/2020 11/14/2020 01/02/2021 08/14/2021 12/10/2021  Labs for ITP Cardiac and Pulmonary Rehab  Cholestrol 100 - 199 mg/dL 604  - - 540  98   LDL (calc) 0 - 99 mg/dL 981  - - 191  46   HDL-C  >39 mg/dL 36  - - 38  35   Trlycerides 0 - 149 mg/dL 97  - - 86  88   Hemoglobin A1c 4.8 - 5.6 % 7.0  - - - -  TCO2 22 - 32 mmol/L - 26  25  - -    Details            Capillary Blood Glucose: Lab Results  Component Value Date   GLUCAP 197 (H) 04/21/2023   GLUCAP 163 (H) 04/21/2023   GLUCAP 156 (H) 04/21/2023   GLUCAP 145 (H) 04/21/2023   GLUCAP 166 (H) 04/20/2023     Exercise Target Goals: Exercise Program Goal: Individual exercise prescription set using results from initial 6 min walk test and THRR while considering  patient's activity barriers and safety.   Exercise Prescription Goal: Starting with aerobic activity 30 plus minutes a day, 3 days per week for initial exercise prescription. Provide home exercise prescription and guidelines that participant acknowledges understanding prior to discharge.  Activity Barriers & Risk Stratification:  Activity Barriers & Cardiac Risk Stratification - 03/17/23 1546       Activity Barriers & Cardiac Risk Stratification   Activity Barriers History of Falls;Balance Concerns;Arthritis;Muscular Weakness;Deconditioning;Shortness of Breath;Decreased Ventricular Function    Cardiac Risk Stratification High             6 Minute Walk:  6 Minute Walk     Row Name 03/17/23 1545         6 Minute Walk   Phase Initial     Distance 733 feet     Walk Time 5.03 minutes     # of Rest Breaks 2  21 sec, 36 sec     MPH 1.66     METS 1.41     RPE 14     Perceived Dyspnea  3     VO2 Peak 4.93     Symptoms Yes (comment)     Comments SOB, walking with limp from leg pain "poor circulation", fatigue across shoulders     Resting HR 51 bpm     Resting BP 100/50     Resting Oxygen Saturation  97 %     Exercise Oxygen Saturation  during 6 min walk 98 %     Max Ex. HR 87 bpm     Max Ex. BP 126/74     2 Minute Post BP 114/56              Oxygen Initial Assessment:   Oxygen Re-Evaluation:   Oxygen Discharge (Final Oxygen  Re-Evaluation):   Initial Exercise Prescription:  Initial Exercise Prescription - 03/17/23 1500  Date of Initial Exercise RX and Referring Provider   Date 03/17/23    Referring Provider Zoila Shutter MD      Oxygen   Maintain Oxygen Saturation 88% or higher      NuStep   Level 1    SPM 80    Minutes 15    METs 2      REL-XR   Level 1    Speed 50    Minutes 15    METs 2      Prescription Details   Frequency (times per week) 2    Duration Progress to 30 minutes of continuous aerobic without signs/symptoms of physical distress      Intensity   THRR 40-80% of Max Heartrate 87-124    Ratings of Perceived Exertion 11-13    Perceived Dyspnea 0-4      Progression   Progression Continue to progress workloads to maintain intensity without signs/symptoms of physical distress.      Resistance Training   Training Prescription Yes    Weight 4 lb    Reps 10-15             Perform Capillary Blood Glucose checks as needed.  Exercise Prescription Changes:   Exercise Prescription Changes     Row Name 03/17/23 1500 03/30/23 1200 04/27/23 1500         Response to Exercise   Blood Pressure (Admit) 100/50 100/50 120/60     Blood Pressure (Exercise) 126/74 98/56 --     Blood Pressure (Exit) 114/56 100/56 118/62     Heart Rate (Admit) 51 bpm 59 bpm 75 bpm     Heart Rate (Exercise) 87 bpm 109 bpm 93 bpm     Heart Rate (Exit) 57 bpm 58 bpm 82 bpm     Oxygen Saturation (Admit) 97 % -- --     Oxygen Saturation (Exercise) 98 % -- --     Rating of Perceived Exertion (Exercise) 14 12 12      Perceived Dyspnea (Exercise) 3 -- --     Symptoms SOB, walking with limp from "poor circulation", fatigue across shoulders -- --     Comments walk test results -- --     Duration -- Continue with 30 min of aerobic exercise without signs/symptoms of physical distress. Continue with 30 min of aerobic exercise without signs/symptoms of physical distress.     Intensity -- THRR unchanged  THRR unchanged       Progression   Progression -- Continue to progress workloads to maintain intensity without signs/symptoms of physical distress. Continue to progress workloads to maintain intensity without signs/symptoms of physical distress.       Resistance Training   Training Prescription -- Yes Yes     Weight -- 4 lbs 4lbs     Reps -- 10-15 10-15       NuStep   Level -- 2 2     SPM -- 92 91     Minutes -- 15 15     METs -- 2.2 2.2       REL-XR   Level -- 2 2     Speed -- 48 45     Minutes -- 15 15     METs -- 2.2 2       Oxygen   Maintain Oxygen Saturation -- 88% or higher 88% or higher              Exercise Comments:   Exercise Goals and Review:   Exercise  Goals     Row Name 03/17/23 1550             Exercise Goals   Increase Physical Activity Yes       Intervention Provide advice, education, support and counseling about physical activity/exercise needs.;Develop an individualized exercise prescription for aerobic and resistive training based on initial evaluation findings, risk stratification, comorbidities and participant's personal goals.       Expected Outcomes Short Term: Attend rehab on a regular basis to increase amount of physical activity.;Long Term: Add in home exercise to make exercise part of routine and to increase amount of physical activity.;Long Term: Exercising regularly at least 3-5 days a week.       Increase Strength and Stamina Yes       Intervention Provide advice, education, support and counseling about physical activity/exercise needs.;Develop an individualized exercise prescription for aerobic and resistive training based on initial evaluation findings, risk stratification, comorbidities and participant's personal goals.       Expected Outcomes Short Term: Increase workloads from initial exercise prescription for resistance, speed, and METs.;Short Term: Perform resistance training exercises routinely during rehab and add in resistance  training at home;Long Term: Improve cardiorespiratory fitness, muscular endurance and strength as measured by increased METs and functional capacity ( )       Able to understand and use rate of perceived exertion (RPE) scale Yes       Intervention Provide education and explanation on how to use RPE scale       Expected Outcomes Short Term: Able to use RPE daily in rehab to express subjective intensity level;Long Term:  Able to use RPE to guide intensity level when exercising independently       Able to understand and use Dyspnea scale Yes       Intervention Provide education and explanation on how to use Dyspnea scale       Expected Outcomes Short Term: Able to use Dyspnea scale daily in rehab to express subjective sense of shortness of breath during exertion;Long Term: Able to use Dyspnea scale to guide intensity level when exercising independently       Knowledge and understanding of Target Heart Rate Range (THRR) Yes       Intervention Provide education and explanation of THRR including how the numbers were predicted and where they are located for reference       Expected Outcomes Short Term: Able to state/look up THRR;Long Term: Able to use THRR to govern intensity when exercising independently;Short Term: Able to use daily as guideline for intensity in rehab       Able to check pulse independently Yes       Intervention Provide education and demonstration on how to check pulse in carotid and radial arteries.;Review the importance of being able to check your own pulse for safety during independent exercise       Expected Outcomes Short Term: Able to explain why pulse checking is important during independent exercise;Long Term: Able to check pulse independently and accurately       Understanding of Exercise Prescription Yes       Intervention Provide education, explanation, and written materials on patient's individual exercise prescription       Expected Outcomes Short Term: Able to explain  program exercise prescription;Long Term: Able to explain home exercise prescription to exercise independently                Exercise Goals Re-Evaluation :  Exercise Goals Re-Evaluation  Row Name 03/30/23 1259 04/27/23 1118           Exercise Goal Re-Evaluation   Exercise Goals Review Increase Physical Activity;Increase Strength and Stamina;Understanding of Exercise Prescription Increase Physical Activity;Increase Strength and Stamina;Understanding of Exercise Prescription      Comments Monterrio has been tolerating exericse well. He is on his 4th session and has already increased his levels on bith the NuStep and XR to level 2. Will continue to monitor and progress as able. Dayna is doing well in rehab.  He was out sick last week.  He is starting to feel better overall and this strength and stamina are starting to improve.  He is walking some at home.      Expected Outcomes Short term: continue to increase SPM and RPM on worklaods   long term: continue to attend rehab Short: Review home exercise guidelines Long: continue to improve stamina                Discharge Exercise Prescription (Final Exercise Prescription Changes):  Exercise Prescription Changes - 04/27/23 1500       Response to Exercise   Blood Pressure (Admit) 120/60    Blood Pressure (Exit) 118/62    Heart Rate (Admit) 75 bpm    Heart Rate (Exercise) 93 bpm    Heart Rate (Exit) 82 bpm    Rating of Perceived Exertion (Exercise) 12    Duration Continue with 30 min of aerobic exercise without signs/symptoms of physical distress.    Intensity THRR unchanged      Progression   Progression Continue to progress workloads to maintain intensity without signs/symptoms of physical distress.      Resistance Training   Training Prescription Yes    Weight 4lbs    Reps 10-15      NuStep   Level 2    SPM 91    Minutes 15    METs 2.2      REL-XR   Level 2    Speed 45    Minutes 15    METs 2      Oxygen    Maintain Oxygen Saturation 88% or higher             Nutrition:  Target Goals: Understanding of nutrition guidelines, daily intake of sodium 1500mg , cholesterol 200mg , calories 30% from fat and 7% or less from saturated fats, daily to have 5 or more servings of fruits and vegetables.  Biometrics:  Pre Biometrics - 03/17/23 1551       Pre Biometrics   Height 6' 0.5" (1.842 m)    Weight 205 lb 3.2 oz (93.1 kg)    Waist Circumference 40 inches    Hip Circumference 41.5 inches    Waist to Hip Ratio 0.96 %    BMI (Calculated) 27.43    Grip Strength 17.9 kg    Single Leg Stand 1.8 seconds              Nutrition Therapy Plan and Nutrition Goals:   Nutrition Assessments:  MEDIFICTS Score Key: >=70 Need to make dietary changes  40-70 Heart Healthy Diet <= 40 Therapeutic Level Cholesterol Diet  Flowsheet Row CARDIAC VIRTUAL BASED CARE from 03/12/2023 in [facility] CARDIAC REHABILITATION  Picture Your Plate Total Score on Admission 31      Picture Your Plate Scores: <16 Unhealthy dietary pattern with much room for improvement. 41-50 Dietary pattern unlikely to meet recommendations for good health and room for improvement. 51-60 More healthful dietary pattern, with  some room for improvement.  >60 Healthy dietary pattern, although there may be some specific behaviors that could be improved.    Nutrition Goals Re-Evaluation:  Nutrition Goals Re-Evaluation     Row Name 04/28/23 1121             Goals   Nutrition Goal Heart Healthy Diet       Comment Rodney is doing well in rehab.  He has started to work on his diet.  But he didn't have an appetite until recently. He is trying to be good about watching sodium and sugars.  He has gotten good at checking labels for sodium levels.       Expected Outcome Short: Continue to work toward heart healthy diet changes Long: continue to watch sodium                Nutrition Goals Discharge (Final Nutrition Goals  Re-Evaluation):  Nutrition Goals Re-Evaluation - 04/28/23 1121       Goals   Nutrition Goal Heart Healthy Diet    Comment Makani is doing well in rehab.  He has started to work on his diet.  But he didn't have an appetite until recently. He is trying to be good about watching sodium and sugars.  He has gotten good at checking labels for sodium levels.    Expected Outcome Short: Continue to work toward heart healthy diet changes Long: continue to watch sodium             Psychosocial: Target Goals: Acknowledge presence or absence of significant depression and/or stress, maximize coping skills, provide positive support system. Participant is able to verbalize types and ability to use techniques and skills needed for reducing stress and depression.  Initial Review & Psychosocial Screening:  Initial Psych Review & Screening - 03/12/23 1034       Initial Review   Current issues with None Identified      Family Dynamics   Good Support System? Yes      Barriers   Psychosocial barriers to participate in program There are no identifiable barriers or psychosocial needs.;The patient should benefit from training in stress management and relaxation.      Screening Interventions   Interventions Encouraged to exercise;To provide support and resources with identified psychosocial needs;Provide feedback about the scores to participant    Expected Outcomes Short Term goal: Utilizing psychosocial counselor, staff and physician to assist with identification of specific Stressors or current issues interfering with healing process. Setting desired goal for each stressor or current issue identified.;Long Term Goal: Stressors or current issues are controlled or eliminated.;Short Term goal: Identification and review with participant of any Quality of Life or Depression concerns found by scoring the questionnaire.;Long Term goal: The participant improves quality of Life and PHQ9 Scores as seen by post scores  and/or verbalization of changes             Quality of Life Scores:  Quality of Life - 03/12/23 0904       Quality of Life   Select Quality of Life      Quality of Life Scores   Health/Function Pre 19.83 %    Socioeconomic Pre 24.38 %    Psych/Spiritual Pre 18.86 %    Family Pre 27.6 %    GLOBAL Pre 21.79 %            Scores of 19 and below usually indicate a poorer quality of life in these areas.  A difference of  2-3  points is a clinically meaningful difference.  A difference of 2-3 points in the total score of the Quality of Life Index has been associated with significant improvement in overall quality of life, self-image, physical symptoms, and general health in studies assessing change in quality of life.  PHQ-9: Review Flowsheet       03/17/2023 05/01/2021 02/13/2021 12/11/2020  Depression screen PHQ 2/9  Decreased Interest 0 0 0 0  Down, Depressed, Hopeless 0 0 0 0  PHQ - 2 Score 0 0 0 0  Altered sleeping 1 - - -  Tired, decreased energy 1 - - -  Change in appetite 1 - - -  Feeling bad or failure about yourself  0 - - -  Trouble concentrating 0 - - -  Moving slowly or fidgety/restless 0 - - -  Suicidal thoughts 0 - - -  PHQ-9 Score 3 - - -  Difficult doing work/chores Somewhat difficult - - -    Details           Interpretation of Total Score  Total Score Depression Severity:  1-4 = Minimal depression, 5-9 = Mild depression, 10-14 = Moderate depression, 15-19 = Moderately severe depression, 20-27 = Severe depression   Psychosocial Evaluation and Intervention:  Psychosocial Evaluation - 03/12/23 1038       Psychosocial Evaluation & Interventions   Interventions Stress management education;Relaxation education;Encouraged to exercise with the program and follow exercise prescription    Comments Patient referred to CR with chronic systolic HF. He had CABG in 2013 and an ICD placed in 2023. He had an ablation to treat A-Fib/A-Flutter 7/23. He denies any  depression, anxiety or stressors in his life. He says he sleeps well with a BIPAP. He has been retired for several years. He worked as a Buyer, retail. He lives alone but says he has great support from his neighbors and friends. He has a daughter that lives out of town but does check on him a lot. He is currently doing outpatient PT working on his balance and gait and strengthening. He plans to continue doing PT while participating in CR. He had 2 falls in May falling backwards hitting his head causing subdural hematoma and was treated in the ED both times. He says he has a cane and walker but does not use them. He enjoys playing golf but is not playing as much as he would like to. His goals for the program are to get stronger; improve his balance; and be able to do more activities without getting tired. He has no barriers identified to complete CR.    Expected Outcomes Short Term: Start the program and attend consistently. Long Term: meet his personal goals.    Continue Psychosocial Services  Follow up required by staff             Psychosocial Re-Evaluation:  Psychosocial Re-Evaluation     Row Name 04/28/23 1120             Psychosocial Re-Evaluation   Current issues with Current Stress Concerns       Comments Aniruddh was out sick last week after having a bleeding issue with his routine colonoscopy.  He ended up spending a few days in the hospital to get back to normal.  Other wise, he is doing well mentally and tries not to let things get to him too much.  He sleeps well most of the time.       Expected Outcomes Short: Continue to exercise  for mental boost Long; Continue to stay positive       Interventions Encouraged to attend Cardiac Rehabilitation for the exercise       Continue Psychosocial Services  Follow up required by staff                Psychosocial Discharge (Final Psychosocial Re-Evaluation):  Psychosocial Re-Evaluation - 04/28/23 1120       Psychosocial  Re-Evaluation   Current issues with Current Stress Concerns    Comments Kawan was out sick last week after having a bleeding issue with his routine colonoscopy.  He ended up spending a few days in the hospital to get back to normal.  Other wise, he is doing well mentally and tries not to let things get to him too much.  He sleeps well most of the time.    Expected Outcomes Short: Continue to exercise for mental boost Long; Continue to stay positive    Interventions Encouraged to attend Cardiac Rehabilitation for the exercise    Continue Psychosocial Services  Follow up required by staff             Vocational Rehabilitation: Provide vocational rehab assistance to qualifying candidates.   Vocational Rehab Evaluation & Intervention:  Vocational Rehab - 03/12/23 1031       Initial Vocational Rehab Evaluation & Intervention   Assessment shows need for Vocational Rehabilitation No      Vocational Rehab Re-Evaulation   Comments Patient is retired.             Education: Education Goals: Education classes will be provided on a weekly basis, covering required topics. Participant will state understanding/return demonstration of topics presented.  Learning Barriers/Preferences:  Learning Barriers/Preferences - 03/12/23 1031       Learning Barriers/Preferences   Learning Barriers None    Learning Preferences Audio;Written Material;Pictoral;Skilled Demonstration             Education Topics: Hypertension, Hypertension Reduction -Define heart disease and high blood pressure. Discus how high blood pressure affects the body and ways to reduce high blood pressure.   Exercise and Your Heart -Discuss why it is important to exercise, the FITT principles of exercise, normal and abnormal responses to exercise, and how to exercise safely. Flowsheet Row CARDIAC REHAB PHASE II EXERCISE from 04/29/2023 in [place] CARDIAC REHABILITATION  Date 03/25/23  Educator HB  Instruction  Review Code 1- Verbalizes Understanding       Angina -Discuss definition of angina, causes of angina, treatment of angina, and how to decrease risk of having angina.   Cardiac Medications -Review what the following cardiac medications are used for, how they affect the body, and side effects that may occur when taking the medications.  Medications include Aspirin, Beta blockers, calcium channel blockers, ACE Inhibitors, angiotensin receptor blockers, diuretics, digoxin, and antihyperlipidemics.   Congestive Heart Failure -Discuss the definition of CHF, how to live with CHF, the signs and symptoms of CHF, and how keep track of weight and sodium intake. Flowsheet Row CARDIAC REHAB PHASE II EXERCISE from 04/29/2023 in [place] CARDIAC REHABILITATION  Date 04/29/23  Educator hb  Instruction Review Code 1- Verbalizes Understanding       Heart Disease and Intimacy -Discus the effect sexual activity has on the heart, how changes occur during intimacy as we age, and safety during sexual activity. Flowsheet Row CARDIAC REHAB PHASE II EXERCISE from 04/29/2023 in [place] CARDIAC REHABILITATION  Date 04/01/23  Educator HB  Instruction Review Code 1- [employer]  Understanding       Smoking Cessation / COPD -Discuss different methods to quit smoking, the health benefits of quitting smoking, and the definition of COPD.   Nutrition I: Fats -Discuss the types of cholesterol, what cholesterol does to the heart, and how cholesterol levels can be controlled.   Nutrition II: Labels -Discuss the different components of food labels and how to read food label   Heart Parts/Heart Disease and PAD -Discuss the anatomy of the heart, the pathway of blood circulation through the heart, and these are affected by heart disease.   Stress I: Signs and Symptoms -Discuss the causes of stress, how stress may lead to anxiety and depression, and ways to limit stress.   Stress II:  Relaxation -Discuss different types of relaxation techniques to limit stress.   Warning Signs of Stroke / TIA -Discuss definition of a stroke, what the signs and symptoms are of a stroke, and how to identify when someone is having stroke.   Knowledge Questionnaire Score:  Knowledge Questionnaire Score - 03/12/23 0906       Knowledge Questionnaire Score   Pre Score 21/26             Core Components/Risk Factors/Patient Goals at Admission:  Personal Goals and Risk Factors at Admission - 03/17/23 1551       Core Components/Risk Factors/Patient Goals on Admission    Weight Management Yes;Weight Maintenance;Weight Loss    Intervention Weight Management: Develop a combined nutrition and exercise program designed to reach desired caloric intake, while maintaining appropriate intake of nutrient and fiber, sodium and fats, and appropriate energy expenditure required for the weight goal.;Weight Management: Provide education and appropriate resources to help participant work on and attain dietary goals.    Admit Weight 205 lb 3.2 oz (93.1 kg)    Goal Weight: Short Term 200 lb (90.7 kg)    Goal Weight: Long Term 198 lb (89.8 kg)    Expected Outcomes Short Term: Continue to assess and modify interventions until short term weight is achieved;Long Term: Adherence to nutrition and physical activity/exercise program aimed toward attainment of established weight goal;Weight Maintenance: Understanding of the daily nutrition guidelines, which includes 25-35% calories from fat, 7% or less cal from saturated fats, less than 200mg  cholesterol, less than 1.5gm of sodium, & 5 or more servings of fruits and vegetables daily;Weight Loss: Understanding of general recommendations for a balanced deficit meal plan, which promotes 1-2 lb weight loss per week and includes a negative energy balance of [phone] kcal/d;Understanding recommendations for meals to include 15-35% energy as protein, 25-35% energy from fat,  35-60% energy from carbohydrates, less than 200mg  of dietary cholesterol, 20-35 gm of total fiber daily;Understanding of distribution of calorie intake throughout the day with the consumption of 4-5 meals/snacks    Diabetes Yes    Intervention Provide education about signs/symptoms and action to take for hypo/hyperglycemia.;Provide education about proper nutrition, including hydration, and aerobic/resistive exercise prescription along with prescribed medications to achieve blood glucose in normal ranges: Fasting glucose 65-99 mg/dL    Expected Outcomes Short Term: Participant verbalizes understanding of the signs/symptoms and immediate care of hyper/hypoglycemia, proper foot care and importance of medication, aerobic/resistive exercise and nutrition plan for blood glucose control.;Long Term: Attainment of HbA1C < 7%.    Heart Failure Yes    Intervention Provide a combined exercise and nutrition program that is supplemented with education, support and counseling about heart failure. Directed toward relieving symptoms such as shortness of breath, decreased exercise tolerance, and  extremity edema.    Expected Outcomes Improve functional capacity of life;Short term: Attendance in program 2-3 days a week with increased exercise capacity. Reported lower sodium intake. Reported increased fruit and vegetable intake. Reports medication compliance.;Short term: Daily weights obtained and reported for increase. Utilizing diuretic protocols set by physician.;Long term: Adoption of self-care skills and reduction of barriers for early signs and symptoms recognition and intervention leading to self-care maintenance.    Hypertension Yes    Intervention Provide education on lifestyle modifcations including regular physical activity/exercise, weight management, moderate sodium restriction and increased consumption of fresh fruit, vegetables, and low fat dairy, alcohol moderation, and smoking cessation.;Monitor prescription use  compliance.    Expected Outcomes Short Term: Continued assessment and intervention until BP is < 140/42mm HG in hypertensive participants. < 130/80mm HG in hypertensive participants with diabetes, heart failure or chronic kidney disease.;Long Term: Maintenance of blood pressure at goal levels.    Lipids Yes    Intervention Provide education and support for participant on nutrition & aerobic/resistive exercise along with prescribed medications to achieve LDL 70mg , HDL >40mg .    Expected Outcomes Short Term: Participant states understanding of desired cholesterol values and is compliant with medications prescribed. Participant is following exercise prescription and nutrition guidelines.;Long Term: Cholesterol controlled with medications as prescribed, with individualized exercise RX and with personalized nutrition plan. Value goals: LDL < 70mg , HDL > 40 mg.             Core Components/Risk Factors/Patient Goals Review:   Goals and Risk Factor Review     Row Name 04/28/23 1129             Core Components/Risk Factors/Patient Goals Review   Personal Goals Review Weight Management/Obesity;Diabetes;Lipids;Hypertension;Heart Failure       Review Ritchie is doing well in rehab.  His weight has been staying steady.  He is feeling good overall.  His pressures are doing well and he checks them routinely and gets 120s/60s. He is now off his carvedilol as his pressures were too low.  He is feeling better overall.  He was going in for a follow up appt  from hospitalization for bleeding and hoping to get a good reveiw.  His review was good.       Expected Outcomes Continue to stay in contact with doctor about bleeding Long: Continue to montior risk factors                Core Components/Risk Factors/Patient Goals at Discharge (Final Review):   Goals and Risk Factor Review - 04/28/23 1129       Core Components/Risk Factors/Patient Goals Review   Personal Goals Review Weight  Management/Obesity;Diabetes;Lipids;Hypertension;Heart Failure    Review Irvine is doing well in rehab.  His weight has been staying steady.  He is feeling good overall.  His pressures are doing well and he checks them routinely and gets 120s/60s. He is now off his carvedilol as his pressures were too low.  He is feeling better overall.  He was going in for a follow up appt  from hospitalization for bleeding and hoping to get a good reveiw.  His review was good.    Expected Outcomes Continue to stay in contact with doctor about bleeding Long: Continue to montior risk factors             ITP Comments:  ITP Comments     Row Name 03/17/23 1554 03/23/23 1040 04/07/23 1549 05/05/23 0805     ITP Comments Patient arrived for  1st visit/orientation/education at 1400. Patient was referred to CR by Dr. Zoila Shutter due to Chronic systolic HF. During orientation advised patient on arrival and appointment times what to wear, what to do before, during and after exercise. Reviewed attendance and class policy.  Pt is scheduled to return Cardiac Rehab on Tuesday 03/23/23 at 1330. Pt was advised to come to class 15 minutes before class starts.  Discussed RPE/Dpysnea scales. Patient participated in warm up stretches. Patient was able to complete 6 minute walk test.  Telemetry:Ventricular Paced Rhythm. Patient was measured for the equipment. Discussed equipment safety with patient. Took patient pre-anthropometric measurements. Patient finished visit at 1510. First full day of exercise!  Patient was oriented to gym and equipment including functions, settings, policies, and procedures.  Patient's individual exercise prescription and treatment plan were reviewed.  All starting workloads were established based on the results of the 6 minute walk test done at initial orientation visit.  The plan for exercise progression was also introduced and progression will be customized based on patient's performance and goals. 30 day  review completed. ITP sent to Dr. Dina Rich, Medical Director of Cardiac Rehab. Continue with ITP unless changes are made by physician. 30 day review completed. ITP sent to Dr. Dina Rich, Medical Director of Cardiac Rehab. Continue with ITP unless changes are made by physician.             Comments: 30 day review

## 2023-05-05 NOTE — Telephone Encounter (Signed)
   Name: [name] [name]  DOB: December 04, 1945  MRN: [id number]  Primary Cardiologist: [name], MD  Chart reviewed as part of pre-operative protocol coverage. The patient has an upcoming visit scheduled with Dr. [name] on 05/11/2023 at which time clearance can be addressed in case there are any issues that would impact surgical recommendations.  I added preop FYI to appointment note so that provider is aware to address at time of outpatient visit.  Per office protocol the cardiology provider should forward their finalized clearance decision and recommendations regarding antiplatelet therapy to the requesting party below.    This message will also be routed to pharmacy pool  for input on holding Eliquis prior to colon surgery as requested below so that this information is available to the clearing provider at time of patient's appointment.   I will route this message as FYI to requesting party and remove this message from the preop box as separate preop APP input not needed at this time.   Please call with any questions.  Napoleon Form, Leodis Rains, NP  05/05/2023, 11:53 AM

## 2023-05-06 ENCOUNTER — Telehealth (INDEPENDENT_AMBULATORY_CARE_PROVIDER_SITE_OTHER): Payer: Self-pay | Admitting: *Deleted

## 2023-05-06 ENCOUNTER — Encounter (HOSPITAL_COMMUNITY)
Admission: RE | Admit: 2023-05-06 | Discharge: 2023-05-06 | Disposition: A | Discharge status: Home / Self Care | Payer: Medicare Other | Source: Ambulatory Visit | Attending: Internal Medicine | Admitting: Internal Medicine

## 2023-05-06 DIAGNOSIS — D6869 Other thrombophilia: Secondary | ICD-10-CM | POA: Diagnosis not present

## 2023-05-06 DIAGNOSIS — I4819 Other persistent atrial fibrillation: Secondary | ICD-10-CM | POA: Diagnosis not present

## 2023-05-06 DIAGNOSIS — I2510 Atherosclerotic heart disease of native coronary artery without angina pectoris: Secondary | ICD-10-CM | POA: Diagnosis not present

## 2023-05-06 DIAGNOSIS — I10 Essential (primary) hypertension: Secondary | ICD-10-CM | POA: Diagnosis not present

## 2023-05-06 DIAGNOSIS — I5022 Chronic systolic (congestive) heart failure: Secondary | ICD-10-CM

## 2023-05-06 DIAGNOSIS — Z95810 Presence of automatic (implantable) cardiac defibrillator: Secondary | ICD-10-CM | POA: Diagnosis not present

## 2023-05-06 LAB — IRON,TIBC AND FERRITIN PANEL
Ferritin: 66 ng/mL (ref 30–400)
Iron Saturation: 8 % — CL (ref 15–55)
Iron: 33 ug/dL — ABNORMAL LOW (ref 38–169)
Total Iron Binding Capacity: 418 ug/dL (ref 250–450)
UIBC: 385 ug/dL — ABNORMAL HIGH (ref 111–343)

## 2023-05-06 LAB — BASIC METABOLIC PANEL
BUN/Creatinine Ratio: 14 (ref 10–24)
BUN: 18 mg/dL (ref 8–27)
CO2: 27 mmol/L (ref 20–29)
Calcium: 8.7 mg/dL (ref 8.6–10.2)
Chloride: 100 mmol/L (ref 96–106)
Creatinine, Ser: 1.3 mg/dL — ABNORMAL HIGH (ref 0.76–1.27)
Glucose: 198 mg/dL — ABNORMAL HIGH (ref 70–99)
Potassium: 4.3 mmol/L (ref 3.5–5.2)
Sodium: 142 mmol/L (ref 134–144)
eGFR: 56 mL/min/{1.73_m2} — ABNORMAL LOW (ref >59)

## 2023-05-06 LAB — CBC WITH DIFFERENTIAL/PLATELET
Basophils Absolute: 0.1 10*3/uL (ref 0.0–0.2)
Basos: 1 %
EOS (ABSOLUTE): 0.2 10*3/uL (ref 0.0–0.4)
Eos: 4 %
Hematocrit: 34 % — ABNORMAL LOW (ref 37.5–51.0)
Hemoglobin: 10.2 g/dL — ABNORMAL LOW (ref 13.0–17.7)
Immature Grans (Abs): 0 10*3/uL (ref 0.0–0.1)
Immature Granulocytes: 1 %
Lymphocytes Absolute: 1.3 10*3/uL (ref 0.7–3.1)
Lymphs: 27 %
MCH: 27.1 pg (ref 26.6–33.0)
MCHC: 30 g/dL — ABNORMAL LOW (ref 31.5–35.7)
MCV: 90 fL (ref 79–97)
Monocytes Absolute: 0.5 10*3/uL (ref 0.1–0.9)
Monocytes: 9 %
Neutrophils Absolute: 2.8 10*3/uL (ref 1.4–7.0)
Neutrophils: 58 %
Platelets: 268 10*3/uL (ref 150–450)
RBC: 3.77 x10E6/uL — ABNORMAL LOW (ref 4.14–5.80)
RDW: 14.6 % (ref 11.6–15.4)
WBC: 4.9 10*3/uL (ref 3.4–10.8)

## 2023-05-06 LAB — BRAIN NATRIURETIC PEPTIDE: BNP: 398 pg/mL — ABNORMAL HIGH (ref 0.0–100.0)

## 2023-05-06 LAB — VITAMIN B12: Vitamin B-12: 259 pg/mL (ref 232–1245)

## 2023-05-06 NOTE — Telephone Encounter (Signed)
Received message from Dr. [name] today stating " Advika Mclelland, lets bring patient back for an office visit in January in preparation for prepped flex sig for polyp surveillance".   Will schedule appointment once we receive Jan schedule

## 2023-05-06 NOTE — Progress Notes (Signed)
Daily Session Note  Patient Details  Name: [name] [name] MRN: [id number] Date of Birth: 04/07/1946 Referring Provider:   Flowsheet Row CARDIAC REHAB PHASE II ORIENTATION from 03/17/2023 in [facility]  Referring Provider Zoila Shutter MD       Encounter Date: 05/06/2023  Check In:  Session Check In - 05/06/23 1200       Check-In   Supervising physician immediately available to respond to emergencies See telemetry face sheet for immediately available MD    Location [facility]    Staff Present Ross Ludwig, BS, Exercise Physiologist   Bess Kinds, RN BSN   Virtual Visit No    Medication changes reported     No    Fall or balance concerns reported    No    Tobacco Cessation No Change    Warm-up and Cool-down Performed on first and last piece of equipment    Resistance Training Performed Yes    VAD Patient? No    PAD/SET Patient? No      Pain Assessment   Currently in Pain? No/denies    Pain Score 0-No pain             Capillary Blood Glucose: Results for orders placed or performed in visit on 05/05/23 (from the past 24 hour(s))  CBC w/Diff/Platelet     Status: Abnormal   Collection Time: 05/05/23 12:08 PM  Result Value Ref Range   WBC 4.9 3.4 - 10.8 x10E3/uL   RBC 3.77 (L) 4.14 - 5.80 x10E6/uL   Hemoglobin 10.2 (L) 13.0 - 17.7 g/dL   Hematocrit 78.2 (L) 95.6 - 51.0 %   MCV 90 79 - 97 fL   MCH 27.1 26.6 - 33.0 pg   MCHC 30.0 (L) 31.5 - 35.7 g/dL   RDW 21.3 08.6 - 57.8 %   Platelets 268 150 - 450 x10E3/uL   Neutrophils 58 Not Estab. %   Lymphs 27 Not Estab. %   Monocytes 9 Not Estab. %   Eos 4 Not Estab. %   Basos 1 Not Estab. %   Neutrophils Absolute 2.8 1.4 - 7.0 x10E3/uL   Lymphocytes Absolute 1.3 0.7 - 3.1 x10E3/uL   Monocytes Absolute 0.5 0.1 - 0.9 x10E3/uL   EOS (ABSOLUTE) 0.2 0.0 - 0.4 x10E3/uL   Basophils Absolute 0.1 0.0 - 0.2 x10E3/uL   Immature Granulocytes 1 Not Estab. %   Immature Grans (Abs) 0.0  0.0 - 0.1 x10E3/uL   Narrative   Performed at:  [address] [employer] [address], [place], [place]  [id number] Lab Director: Jolene Schimke MD, Phone:  [phone]  Specimen Comment: A courtesy copy of this report has been sent to [facility] at Specimen Comment: [facility]  Iron, TIBC and Ferritin Panel     Status: Abnormal   Collection Time: 05/05/23 12:08 PM  Result Value Ref Range   Total Iron Binding Capacity 418 250 - 450 ug/dL   UIBC 725 (H) 366 - 440 ug/dL   Iron 33 (L) 38 - 347 ug/dL   Iron Saturation 8 (LL) 15 - 55 %   Ferritin 66 30 - 400 ng/mL   Narrative   Performed at:  [address], [place], [place]  [id number] Lab Director: Jolene Schimke MD, Phone:  [phone]  Specimen Comment: A courtesy copy of this report has been sent to [facility] at Specimen Comment: [facility]  Vitamin B12     Status: None   Collection Time: 05/05/23  12:08 PM  Result Value Ref Range   Vitamin B-12 259 232 - 1,245 pg/mL   Narrative   Performed at:  [address] [place], [place]  [id number] Lab Director: Jolene Schimke MD, Phone:  [phone]  Specimen Comment: A courtesy copy of this report has been sent to [facility] at Specimen Comment: [facility]      Social History   Tobacco Use  Smoking Status Former   Types: Cigars   Quit date: 09/07/2011   Years since quitting: 11.6  Smokeless Tobacco Former   Quit date: 02/28/2012  Tobacco Comments   Former smoker 02/16/23    Goals Met:  Independence with exercise equipment Exercise tolerated well No report of concerns or symptoms today Strength training completed today  Goals Unmet:  Not Applicable  Comments: Pt able to follow exercise prescription today without complaint.  Will continue to monitor for progression.

## 2023-05-06 NOTE — Telephone Encounter (Signed)
Patient with diagnosis of afib on Eliquis for anticoagulation.    Procedure: colon surgery Date of procedure: TBD   CHA2DS2-VASc Score = 8   This indicates a 10.8% annual risk of stroke. The patient's score is based upon: CHF History: 1 HTN History: 1 Diabetes History: 1 Stroke History: 2 Vascular Disease History: 1 Age Score: 2 Gender Score: 0      CrCl 60 ml/min  Per office protocol, patient can hold Elqiuis for 2 days prior to procedure.     **This guidance is not considered finalized until pre-operative APP has relayed final recommendations.**

## 2023-05-07 ENCOUNTER — Telehealth: Payer: Self-pay

## 2023-05-07 DIAGNOSIS — I5022 Chronic systolic (congestive) heart failure: Secondary | ICD-10-CM

## 2023-05-07 NOTE — Telephone Encounter (Signed)
Called patient advised of below they verbalized understanding.

## 2023-05-07 NOTE — Telephone Encounter (Signed)
-----   Message from [name] sent at 05/06/2023  5:21 PM EST ----- Please let Mr. Hawk know that his electrolytes are normal, his kidney function is slightly reduced, this is not uncommon with increasing Lasix.  Will plan to recheck a BMET in 2 weeks for monitoring. His BNP which indicates holding of fluid is improved compared to last week.  Continue current medications.  We did receive preoperative cardiac exam request from [facility] surgery and notes from office visit, these have been forwarded to Dr. [name] and Dr. [name] for review.

## 2023-05-11 ENCOUNTER — Encounter (HOSPITAL_COMMUNITY): Payer: Medicare Other

## 2023-05-13 ENCOUNTER — Encounter: Payer: Self-pay | Admitting: Cardiology

## 2023-05-13 ENCOUNTER — Encounter (HOSPITAL_COMMUNITY)
Admission: RE | Admit: 2023-05-13 | Discharge: 2023-05-13 | Disposition: A | Discharge status: Home / Self Care | Payer: Medicare Other | Source: Ambulatory Visit | Attending: Internal Medicine | Admitting: Internal Medicine

## 2023-05-13 ENCOUNTER — Ambulatory Visit (INDEPENDENT_AMBULATORY_CARE_PROVIDER_SITE_OTHER): Payer: Medicare Other | Admitting: Cardiology

## 2023-05-13 VITALS — BP 110/56 | HR 68 | Ht 74.0 in | Wt 209.6 lb

## 2023-05-13 DIAGNOSIS — D6869 Other thrombophilia: Secondary | ICD-10-CM | POA: Insufficient documentation

## 2023-05-13 DIAGNOSIS — Z95810 Presence of automatic (implantable) cardiac defibrillator: Secondary | ICD-10-CM | POA: Diagnosis not present

## 2023-05-13 DIAGNOSIS — I4819 Other persistent atrial fibrillation: Secondary | ICD-10-CM | POA: Insufficient documentation

## 2023-05-13 DIAGNOSIS — I5022 Chronic systolic (congestive) heart failure: Secondary | ICD-10-CM

## 2023-05-13 DIAGNOSIS — I10 Essential (primary) hypertension: Secondary | ICD-10-CM | POA: Insufficient documentation

## 2023-05-13 DIAGNOSIS — I2510 Atherosclerotic heart disease of native coronary artery without angina pectoris: Secondary | ICD-10-CM | POA: Insufficient documentation

## 2023-05-13 LAB — CUP PACEART INCLINIC DEVICE CHECK
Battery Remaining Longevity: 93 mo
Battery Voltage: 3.01 V
Brady Statistic AP VP Percent: 42.59 %
Brady Statistic AP VS Percent: 0.13 %
Brady Statistic AS VP Percent: 57.17 %
Brady Statistic AS VS Percent: 0.11 %
Brady Statistic RA Percent Paced: 42.41 %
Brady Statistic RV Percent Paced: 99.04 %
Date Time Interrogation Session: 20241114173140
HighPow Impedance: 59 Ohm
Implantable Lead Connection Status: 753985
Implantable Lead Connection Status: 753985
Implantable Lead Connection Status: 753985
Implantable Lead Implant Date: 20231102
Implantable Lead Implant Date: 20231102
Implantable Lead Implant Date: 20231102
Implantable Lead Location: 753858
Implantable Lead Location: 753859
Implantable Lead Location: 753860
Implantable Lead Model: 4598
Implantable Lead Model: 5076
Implantable Lead Model: 6935
Implantable Pulse Generator Implant Date: 20231102
Lead Channel Impedance Value: 166.114
Lead Channel Impedance Value: 166.114
Lead Channel Impedance Value: 166.114
Lead Channel Impedance Value: 171 Ohm
Lead Channel Impedance Value: 171 Ohm
Lead Channel Impedance Value: 247 Ohm
Lead Channel Impedance Value: 323 Ohm
Lead Channel Impedance Value: 342 Ohm
Lead Channel Impedance Value: 342 Ohm
Lead Channel Impedance Value: 342 Ohm
Lead Channel Impedance Value: 380 Ohm
Lead Channel Impedance Value: 437 Ohm
Lead Channel Impedance Value: 494 Ohm
Lead Channel Impedance Value: 589 Ohm
Lead Channel Impedance Value: 589 Ohm
Lead Channel Impedance Value: 589 Ohm
Lead Channel Impedance Value: 589 Ohm
Lead Channel Impedance Value: 589 Ohm
Lead Channel Pacing Threshold Amplitude: 0.5 V
Lead Channel Pacing Threshold Amplitude: 1 V
Lead Channel Pacing Threshold Amplitude: 1 V
Lead Channel Pacing Threshold Pulse Width: 0.4 ms
Lead Channel Pacing Threshold Pulse Width: 0.4 ms
Lead Channel Pacing Threshold Pulse Width: 0.4 ms
Lead Channel Sensing Intrinsic Amplitude: 1.5 mV
Lead Channel Sensing Intrinsic Amplitude: 2 mV
Lead Channel Sensing Intrinsic Amplitude: 6.25 mV
Lead Channel Sensing Intrinsic Amplitude: 6.875 mV
Lead Channel Setting Pacing Amplitude: 1.5 V
Lead Channel Setting Pacing Amplitude: 2 V
Lead Channel Setting Pacing Amplitude: 2 V
Lead Channel Setting Pacing Pulse Width: 0.4 ms
Lead Channel Setting Pacing Pulse Width: 0.4 ms
Lead Channel Setting Sensing Sensitivity: 0.3 mV
Zone Setting Status: 755011

## 2023-05-13 NOTE — Progress Notes (Signed)
Cardiology Office Note    Date:  05/14/2023  ID:  [name] [name], DOB April 13, 1946, MRN [id number] PCP:  [name], MD  Cardiologist:  [name], MD  Electrophysiologist:  [name], MD   Chief Complaint: Follow up heart failure   History of Present Illness: .   [name] [name] is a 77 y.o. male with visit-pertinent history of CAD s/p CABG in 2013 with ischemic cardiomyopathy-EF 20-25% prior to surgery, improved to 40-45% by 10/2012, now 30-35%, mild MR, PAD, DM and PAF on chronic anticoagulation s/p ablation. Reduction in EF led to ICD insertion in 2023.    Heart catheterization in 08/2013 showed patent LIMA to LAD, SVG-PLA, SVG-PDA.  Most recent heart catheterization in 11/2021 showed patent LIMA to LAD but occlusion of both SVG grafts.  Given wall motion abnormality on echocardiogram, viability study was recommended to help make decisions regarding complex atherectomy and intervention of the left circumflex that would involve stenting back of the left main and covering the long segment throughout the circumflex to treat multiple severe calcific lesions.  Cardiac MRI was pursued and showed EF 20% and severe thinning in the apical to basal lateral wall and apical inferior wall suggesting nonviability in these regions.  Therefore complex intervention was not pursued.  He was treated medically.  Given persistent reduction in EF he underwent BiV ICD insertion CRT-D on 04/30/2022.   In March 2024 he reported worsening shortness of breath.  Device interrogation showed possible fluid accumulation in atrial fibrillation.  Diuretic was increased and EKG showed underlying A-fib with ventricularly paced rhythm and PVCs.  He presented for scheduled outpatient cardioversion on 10/16/2022 and was successfully cardioverted to a paced BiV rhythm.  He was seen by Dr. [name] on 10/27/2022 and was back in afib, he was started on amiodarone.   Cardiology was consulted in 10/2022 while patient was  admitted in setting of mechanical fall resulting in subdural hematoma 4 mm without any mass effect.  After discharge he fell again, he resumed Eliquis on 11/24/22. On 12/23/2022 he presented for DCCV which was successful.  On 01/19/23 he underwent afib ablation with Dr. [name], on follow up with afib clinic on 8/20 he reported no further afib. He was last seen in clinic on 02/26/23 by Dr. [name], it was noted that he had lost 50 lbs over the past year, he had an abnormal cologuard test with his PCP.    On 04/17/23 patient underwent colonoscopy, was found to have multiple scattered diverticula as well as 3 polyps that were clipped with cold snare's.  Following he had normal bowel movements until 2 days after colonoscopy when he noticed bright red blood from his rectum during bowel movements.  He presented to the ED on 04/19/2023, blood pressures were soft and stools positive for blood.  His hemoglobin at baseline is around 11, decreased to 8.3.  He received 1 unit of packed red blood cells, hemoglobin proved to 9.4.  He underwent repeat colonoscopy while inpatient showing a single post polypectomy ulcer in the cecum.  He had multiple clips placed at previous polypectomy.  While inpatient he was found to be orthostatic, he was given a 500 mL fluid bolus, his Lasix was held while inpatient.  He was started on midodrine 5 mg 3 times daily.  His Coreg was discontinued, he was instructed to resume Eliquis on 04/26/2023.   On 04/22/2023 he had a device check which showed 0% A-fib burden, OptiVol thoracic impedance suggested possible  fluid accumulation starting 10/3 and becoming worse during and following hospital discharge on 10/23.  Following discharge he was restarted on his furosemide 40 mg daily with ICM clinic phone appointment on 05/03/2023 to recheck fluid levels.   He was last seen in clinic on 04/27/23. He felt he was improving, noted that he felt significantly better than he did the week prior.  He reported that  he had stopped midodrine as he was noting intermittent headaches and difficulty with urination.  With lower extremity edema noted on exam and thoracic impedance noted possible fluid accumulation his Lasix was increased to 40 mg in the morning and afternoon for 4 days.  Today he presents for follow up. He reports that he feels that he is doing well, he denies shortness of breath, current lower extremity edema.  He has been attending cardiac rehab and tolerating well.  He notes that he does have some ongoing fatigue that is slowly improving.  He reports that at this time he is unsure if he is going to have sigmoidectomy, notes that he would rather not have surgery at this time and prefers plan of colonoscopy in 6 months. ROS: .   Today he denies chest pain, shortness of breath, lower extremity edema, fatigue, palpitations, melena, hematuria, hemoptysis, diaphoresis, weakness, presyncope, syncope, orthopnea, and PND.  All other systems are reviewed and otherwise negative. Studies Reviewed: Marland Kitchen    EKG:  EKG is not ordered today.   CV Studies:  Cardiac Studies & Procedures   CARDIAC CATHETERIZATION  CARDIAC CATHETERIZATION 12/15/2021  Narrative 1.  Severe native coronary artery disease with patency of the left main, total occlusion of the LAD just after the first diagonal, multiple severe calcific stenoses throughout the entirety of the circumflex distribution, and moderate stenoses in the RCA 2.  Status post CABG with continued patency of the LIMA to LAD, and interval occlusion of the SVG to PDA and SVG to PLA 3.  Normal LVEDP  Heart team review, consider medical therapy versus complex atherectomy and intervention of the left circumflex which would involve stenting back into the left main and covering a long segment throughout the circumflex in order to treat multiple severe calcific lesions.  Note that the patient had akinesis of the inferior and posterior walls on his echo and hypokinesis of the  anterior wall segments.  He does not have angina.  Might be appropriate to perform a viability study.  Will discuss his case with Dr. [name].  Findings Coronary Findings Diagnostic  Dominance: Right  Left Main Ost LM to LM lesion is 30% stenosed. The lesion is discrete. The lesion is moderately calcified.  Left Anterior Descending Vessel is small. Ost LAD to Prox LAD lesion is 50% stenosed. Mid LAD lesion is 100% stenosed. The lesion is severely calcified.  Left Circumflex Vessel is small. The entire circumflex vessel is severely calcified.  There is high-grade 95% stenosis at the ostium, severe eccentric 95% stenosis in the proximal vessel, severe 90% stenosis in the mid vessel, and severe 90% stenosis in the distal vessel just before it branches into 2 OM&#39;s Ost Cx lesion is 95% stenosed. The lesion is calcified. Prox Cx to Mid Cx lesion is 90% stenosed. The lesion is eccentric. The lesion is severely calcified. Mid Cx to Dist Cx lesion is 90% stenosed. The lesion is discrete.  Right Coronary Artery Prox RCA lesion is 60% stenosed. The lesion is type C and discrete. The lesion is severely calcified. Dist RCA lesion is 50% stenosed.  Right Posterior Descending Artery Vessel is small in size.  Right Posterior Atrioventricular Artery Vessel is small in size. RPAV lesion is 100% stenosed.  Graft To RPDA And is moderate in size.  The graft exhibits minimal luminal irregularities. Origin to Prox Graft lesion is 100% stenosed.  Graft To RPAV And is small.  The graft exhibits no disease. Origin to Prox Graft lesion is 100% stenosed.  LIMA LIMA Graft To Dist LAD LIMA and is moderate in size.  The graft exhibits no disease. There is competitive flow. The LIMA to LAD graft is widely patent with no stenosis.  This fills the entirety of the LAD as it is occluded just beyond the first diagonal branch  Intervention  No interventions have been documented.   CARDIAC  CATHETERIZATION  CARDIAC CATHETERIZATION 09/13/2015  Narrative  SVG was injected is moderate in size. numerous valves  The graft exhibits minimal luminal irregularities.  SVG was injected is small, and is anatomically normal.  Prox RCA lesion, 85% stenosed.  Prox LAD lesion, 100% stenosed.  Pedicle LIMA was injected is moderate in size, and is anatomically normal.  There is competitive flow.  Mid Cx to Dist Cx lesion, 30% stenosed.  Post Atrio lesion, 100% stenosed.  Ost LM to LM lesion, 30% stenosed.  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 Patent LIMA-LAD, SVG-PDA and SVG-PLB grafts with TIMI III flow and good distal runoff. LVEDP = 16 mmHg. Moderately reduced CO by Fick of 4.8 L/min and CI of 2 L/min, RA of 6 and PA mean of 29 mmHg. Compensated right and left heart pressures. LVEF 30-35% with global hypokinesis and inferior akinesis.  [name], MD, [facility] Attending Cardiologist [facility]  Findings Coronary Findings Diagnostic  Dominance: Right  Left Main Moderately Calcified discrete.  Left Anterior Descending . Vessel is small. Severely Calcified.  Left Circumflex . Vessel is small. Discrete.  Right Coronary Artery The lesion is type C Discrete.  Right Posterior Descending Artery The vessel is small in size.  Right Posterior Atrioventricular Artery The vessel is small in size.  Single Graft Graft To RPDA SVG was injected is moderate in size. numerous valves  The graft exhibits minimal luminal irregularities.  Single Graft Graft To RPAV SVG was injected is small, and is anatomically normal.  LIMA LIMA Graft To Dist LAD LIMA was injected is moderate in size, and is anatomically normal. There is competitive flow.  Intervention  No interventions have been documented.   STRESS TESTS  NM MYOCAR MULTI W/SPECT W 10/20/2010   ECHOCARDIOGRAM  ECHOCARDIOGRAM COMPLETE  02/19/2023  Narrative ECHOCARDIOGRAM REPORT    Patient Name:   [name] [name] Date of Exam: 02/19/2023 Medical Rec #:  [id number]      Height:       74.0 in Accession #:    [id number]     Weight:       205.2 lb Date of Birth:  1945-11-08     BSA:          2.197 m Patient Age:    14 years       BP:           110/59 mmHg Patient Gender: M              HR:           67 bpm. [facility]  Procedure: 2D Echo, Cardiac Doppler and Color Doppler  Indications:    Congestive  Heart Failure I50.9  History:        Patient has prior history of Echocardiogram examinations, most recent 11/10/2022. Cardiomyopathy and CHF, CAD, Prior CABG and Defibrillator, Stroke, Arrythmias:Atrial Fibrillation; Risk Factors:Diabetes, Hypertension and Dyslipidemia.  Sonographer:    Celesta Gentile RCS Referring Phys: [phone] KENNETH C [name]  IMPRESSIONS   1. Left ventricular ejection fraction, by estimation, is 30 to 35%. The left ventricle has moderately decreased function. The left ventricle demonstrates regional wall motion abnormalities (see scoring diagram/findings for description). The left ventricular internal cavity size was moderately dilated. There is mild asymmetric left ventricular hypertrophy of the basal segment. Left ventricular diastolic parameters are consistent with Grade III diastolic dysfunction (restrictive). 2. Right ventricular systolic function is mildly reduced. The right ventricular size is normal. There is severely elevated pulmonary artery systolic pressure. The estimated right ventricular systolic pressure is 60.1 mmHg. 3. Left atrial size was mildly dilated. 4. Right atrial size was mildly dilated. 5. The mitral valve is abnormal with restricted posterior leaflet. Mild mitral valve regurgitation. 6. Tricuspid valve regurgitation is mild to moderate. 7. The aortic valve is tricuspid. Aortic valve regurgitation is not visualized. 8. The inferior vena cava is dilated in size with  >50% respiratory variability, suggesting right atrial pressure of 8 mmHg.  Comparison(s): Prior images reviewed side by side. LVEF 30-35% with wall motion abnormalities consistent with ischemic cardiomyopathy. Mild mitral regurgitation. Severely elevated estimated RVSP of 60 mmHg.  FINDINGS Left Ventricle: Left ventricular ejection fraction, by estimation, is 30 to 35%. The left ventricle has moderately decreased function. The left ventricle demonstrates regional wall motion abnormalities. The left ventricular internal cavity size was moderately dilated. There is mild asymmetric left ventricular hypertrophy of the basal segment. Left ventricular diastolic parameters are consistent with Grade III diastolic dysfunction (restrictive).   LV Wall Scoring: The antero-lateral wall and posterior wall are akinetic. The mid and distal anterior septum, inferior septum, apical lateral segment, and apex are hypokinetic. The basal anteroseptal segment is normal.  Right Ventricle: The right ventricular size is normal. No increase in right ventricular wall thickness. Right ventricular systolic function is mildly reduced. There is severely elevated pulmonary artery systolic pressure. The tricuspid regurgitant velocity is 3.61 m/s, and with an assumed right atrial pressure of 8 mmHg, the estimated right ventricular systolic pressure is 60.1 mmHg.  Left Atrium: Left atrial size was mildly dilated.  Right Atrium: Right atrial size was mildly dilated.  Pericardium: There is no evidence of pericardial effusion.  Mitral Valve: The mitral valve is abnormal. Mild mitral valve regurgitation. MV peak gradient, 7.8 mmHg. The mean mitral valve gradient is 2.0 mmHg.  Tricuspid Valve: The tricuspid valve is grossly normal. Tricuspid valve regurgitation is mild to moderate.  Aortic Valve: The aortic valve is tricuspid. There is mild aortic valve annular calcification. Aortic valve regurgitation is not  visualized.  Pulmonic Valve: The pulmonic valve was grossly normal. Pulmonic valve regurgitation is trivial.  Aorta: The aortic root is normal in size and structure.  Venous: The inferior vena cava is dilated in size with greater than 50% respiratory variability, suggesting right atrial pressure of 8 mmHg.  IAS/Shunts: No atrial level shunt detected by color flow Doppler.  Additional Comments: A device lead is visualized.   LEFT VENTRICLE PLAX 2D LVIDd:         6.40 cm      Diastology LVIDs:         5.30 cm      LV e' medial:  3.06 cm/s LV PW:         0.80 cm      LV E/e' medial:  37.9 LV IVS:        1.20 cm      LV e' lateral:   5.12 cm/s LVOT diam:     2.00 cm      LV E/e' lateral: 22.7 LV SV:         57 LV SV Index:   26 LVOT Area:     3.14 cm  LV Volumes (MOD) LV vol d, MOD A2C: 163.0 ml LV vol d, MOD A4C: 167.0 ml LV vol s, MOD A2C: 101.0 ml LV vol s, MOD A4C: 111.0 ml LV SV MOD A2C:     62.0 ml LV SV MOD A4C:     167.0 ml LV SV MOD BP:      60.2 ml  RIGHT VENTRICLE RV S prime:     7.25 cm/s TAPSE (M-mode): 1.9 cm  LEFT ATRIUM              Index        RIGHT ATRIUM           Index LA diam:        4.70 cm  2.14 cm/m   RA Area:     24.60 cm LA Vol (A2C):   103.0 ml 46.88 ml/m  RA Volume:   75.10 ml  34.18 ml/m LA Vol (A4C):   56.4 ml  25.67 ml/m LA Biplane Vol: 78.1 ml  35.54 ml/m AORTIC VALVE LVOT Vmax:   75.70 cm/s LVOT Vmean:  48.500 cm/s LVOT VTI:    0.181 m  AORTA Ao Root diam: 3.30 cm  MITRAL VALVE                TRICUSPID VALVE MV Area (PHT): 4.31 cm     TR Peak grad:   52.1 mmHg MV Area VTI:   1.52 cm     TR Vmax:        361.00 cm/s MV Peak grad:  7.8 mmHg MV Mean grad:  2.0 mmHg     SHUNTS MV Vmax:       1.40 m/s     Systemic VTI:  0.18 m MV Vmean:      54.4 cm/s    Systemic Diam: 2.00 cm MV Decel Time: 176 msec MR Peak grad: 82.8 mmHg MR Mean grad: 49.0 mmHg MR Vmax:      455.00 cm/s MR Vmean:     318.0 cm/s MV E velocity: 116.00  cm/s MV A velocity: 41.10 cm/s MV E/A ratio:  2.82  Nona Dell MD Electronically signed by Nona Dell MD Signature Date/Time: 02/19/2023/10:45:43 AM    Final      CARDIAC MRI  MR CARDIAC MORPHOLOGY W WO CONTRAST 01/19/2022  Narrative CLINICAL DATA:  16M with CAD s/p CABG x3 (LIMa-LAD, SVG-PDA, SVG-PLA). Echo 12/05/21 showed EF 30-35%. Cath 12/15/21 showed patient LIMA-LAD, occluded SVG-PDA and SVG-PLA. Evaluate viability.  EXAM: CARDIAC MRI  TECHNIQUE: The patient was scanned on a 1.5 Tesla Siemens magnet. A dedicated cardiac coil was used. Functional imaging was done using Fiesta sequences. 2,3, and 4 chamber views were done to assess for RWMA's. Modified Simpson's rule using a short axis stack was used to calculate an ejection fraction on a dedicated work [employer]. The patient received 10 cc of Gadavist. After 10 minutes inversion recovery sequences were used to assess for infiltration and scar tissue.  CONTRAST:  10 cc  of Gadavist  FINDINGS: Left ventricle:  -Severe dilatation  -Severe systolic dysfunction. Thinning/akinesis of lateral and inferior walls. Lateral wall and apical inferior wall measures <4.2mm in thickness, suggesting nonviability.  -Elevated ECV (38%)  -No LGE  LV EF: 28% (Normal 56-78%)  Absolute volumes:  LV EDV: (Normal 77-195 mL)  LV ESV: (Normal 19-72 mL)  LV SV: 95mL (Normal 51-133 mL)  CO: 6.0L/min (Normal 2.8-8.8 L/min)  Indexed volumes:  LV EDV: 138mL/sq-m (Normal 47-92 mL/sq-m)  LV ESV: 168mL/sq-m (Normal 13-30 mL/sq-m)  LV SV: 85mL/sq-m (Normal 32-62 mL/sq-m)  CI: 2.7L/min/sq-m (Normal 1.7-4.2 L/min/sq-m)  Right ventricle: Mild dilatation with mild systolic dysfunction  RV EF:  95% (Normal 47-74%)  Absolute volumes:  RV EDV: (Normal 88-227 mL)  RV ESV: (Normal 23-103 mL)  RV SV: 96mL (Normal 52-138 mL)  CO: 6.1L/min (Normal 2.8-8.8 L/min)  Indexed  volumes:  RV EDV: 163mL/sq-m (Normal 55-105 mL/sq-m)  RV ESV: 28mL/sq-m (Normal 15-43 mL/sq-m)  RV SV: 13mL/sq-m (Normal 32-64 mL/sq-m)  CI: 2.7L/min/sq-m (Normal 1.7-4.2 L/min/sq-m)  Left atrium: Mild enlargement  Right atrium: Mild enlargement  Mitral valve: Moderate mitral regurgitation (regurgitant fraction 27%)  Aortic valve: No regurgitation  Tricuspid valve: Moderate tricuspid regurgitation (regurgitant fraction 25%)  Pulmonic valve: No regurgitation  Aorta: Normal proximal ascending aorta  Pericardium: Normal  IMPRESSION: 1. Severe LV dilatation with severe systolic dysfunction (EF 28%). Thinning/akinesis of inferior and lateral walls.  2.  Mild RV dilatation with mild systolic dysfunction (EF 40%)  3. While no late gadolinium enhancement is seen, there is severe thinning (less than 4.44mm) in the apical to basal lateral wall and apical inferior wall, suggesting nonviability in these regions.  4.  Moderate mitral regurgitation (regurgitant fraction 27%)  5.  Moderate tricuspid regurgitation (regurgitant fraction 25%)   Electronically Signed By: Epifanio Lesches M.D. On: 01/20/2022 23:31          Current Reported Medications:.    Current Meds  Medication Sig   acetaminophen (TYLENOL) 500 MG tablet Take 500-1,000 mg by mouth every 6 (six) hours as needed (for pain.).   apixaban (ELIQUIS) 5 MG TABS tablet Take 1 tablet (5 mg total) by mouth 2 (two) times daily.   empagliflozin (JARDIANCE) 25 MG TABS tablet Take 1 tablet (25 mg total) by mouth daily.   furosemide (LASIX) 40 MG tablet Take 1 tablet (40 mg total) by mouth daily. Take 1  tablet by mouth in the morning and 1 tablet by mouth in the afternoon as needed.   gabapentin (NEURONTIN) 300 MG capsule Take 600 mg by mouth at bedtime.   metFORMIN (GLUCOPHAGE) 1000 MG tablet Take 1,000 mg by mouth 2 times daily at 12 noon and 4 pm.   metFORMIN (GLUCOPHAGE-XR) 500 MG 24 hr tablet Take 1,000 mg by  mouth 2 (two) times daily.   midodrine (PROAMATINE) 5 MG tablet Take 1 tablet (5 mg total) by mouth as needed. For systolic blood pressure <95   ONETOUCH VERIO test strip 1 each daily.   polyethylene glycol (MIRALAX / GLYCOLAX) 17 g packet Take 17 g by mouth daily as needed for mild constipation.   rosuvastatin (CRESTOR) 10 MG tablet Take 1 tablet (10 mg total) by mouth every other day.   senna-docusate (SENOKOT-S) 8.6-50 MG tablet Take 1 tablet by mouth at bedtime as needed for mild constipation.   Physical Exam:    VS:  BP (!) 110/50   Pulse 72   Ht 6'  2" (1.88 m)   Wt 204 lb (92.5 kg)   SpO2 99%   BMI 26.19 kg/m    Wt Readings from Last 3 Encounters:  05/14/23 204 lb (92.5 kg)  05/13/23 209 lb 9.6 oz (95.1 kg)  05/05/23 205 lb 12.8 oz (93.4 kg)    GEN: Well nourished, well developed in no acute distress NECK: No JVD; No carotid bruits CARDIAC: RRR, no murmurs, rubs, gallops RESPIRATORY:  Clear to auscultation without rales, wheezing or rhonchi  ABDOMEN: Soft, non-tender, non-distended EXTREMITIES:  No edema; No acute deformity   Asessement and Plan:.    Chronic systolic heart failure: EF 28% by CT MRI in 12/2021, CRT-D inserted in 2023.  Most recent echo on 02/19/2023 indicated LVEF of 30 to 35%, LV with moderately decreased function, LV demonstrating regional wall motion abnormalities, LV internal cavity was moderately dilated, grade 3 diastolic dysfunction.  RV systolic function was mildly reduced.  There is severely elevated pulmonary artery systolic pressure. On 10/24 device check showed 0% A-fib burden, OptiVol thoracic impedance suggested possible fluid accumulation starting 10/3 and becoming worse during and following hospital discharge on 10/23. Coreg was discontinued during admission for low blood pressure, now improved.  At last office visit his Lasix was increased to 40 mg twice daily for 4 days. OptiVol thoracic impedance suggested his fluid status was returning to  baseline. Today he reports he is doing well, his lower extremity edema and shortness of breath is overall improved.  He appears euvolemic and well compensated today on exam. Encouraged to continue monitoring daily weights. Continue continue Jardiance 25 mg daily, Lasix.  He will continue to monitor his blood pressures at home, if they start increasing will consider restarting carvedilol. Repeat BMET.    Atrial fibrillation: Underwent DCCV in 04/2019 and 09/2022.  Following cardioversion in April 2024 his ICD transmission suggested he had an early return to A-fib after. Underwent amiodarone loading and DCCV on 12/23/2022. On 01/19/23 he underwent afib ablation with Dr. [name]. Most recent device interrogation showed 0% afib/flutter burden. Seen by Dr. [name] yesterday, Amiodarone was discontinued.     GI bleeding/Chronic anticoagulation: Patient had colonoscopy on 04/17/23, admitted with BRBPR on 04/19/23. He received 1 unit PRBC and IV fluids in setting of hypotension. Underwent repeat colonoscopy with further clips placed. He denies any further bleeding. Restarted on Eliquis 5 mg twice daily, he denies any further bleeding problems.    CAD: s/p CABG in 2013.  Last heart catheterization in 11/2021 indicated occluded SVG to PDA and SVG to PLA, he patent LIMA to LAD.  Recommended to treat medically. Stable with no anginal symptoms. No indication for ischemic evaluation.     Hypotension: Blood pressure today 116/40, on recheck 11/50. Coreg was discontinued while inpatient in setting of orthostatic hypotension given blood loss.  Patient will continue to monitor his blood pressure at home, if uptrending can consider restarting carvedilol. Will continue midodrine as needed for systolic blood pressure less than 95.   Hyperlipidemia: On rosuvastatin 10 mg every other day.   OSA on BiPAP: Patient reports nightly compliance.    Severe MR: Noted to be mild on echo on 02/19/2023.  Will continue monitoring with echoes  repeat.  Preoperative cardiac evaluation: Request for preoperative cardiac evaluation received by [facility] surgery for recommendations regarding colon surgery. Mr. Marshburn perioperative risk of a major cardiac event is 11% according to the Revised Cardiac Risk Index (RCRI).  Therefore, he is at high risk for perioperative complications.   His  functional capacity is fair at 5.07 METs according to the Duke Activity Status Index (DASI).Recommendations:According to ACC/AHA guidelines, no further cardiovascular testing needed.  The patient may proceed to surgery at acceptable risk.  Please note that at this time patient is euvolemic and well compensated on exam, however given his history of heart failure his fluid status can vary.  If surgery is not upcoming in the next 2 months it may be necessary for patient to be seen again in clinic in order to optimize his fluid status prior to surgery. Antiplatelet and/or Anticoagulation Recommendations: Per office protocol, patient can hold Elqiuis for 2 days prior to procedure. Please resume when safe to do so from a bleeding standpoint.  Preoperative cardiac recommendations were discussed with Dr. [name], who noted that he was at least at moderate to high risk but this would not be prohibitive. Patient would need be optimized regarding his fluid status prior to surgery.   Disposition: F/u with [name], NP in 6 weeks. Follow up with Dr. [name] in 3 months.   Signed, [name], NP

## 2023-05-13 NOTE — Progress Notes (Signed)
Daily Session Note  Patient Details  Name: [name] [name] MRN: [id number] Date of Birth: Aug 11, 1945 Referring Provider:   Flowsheet Row CARDIAC REHAB PHASE II ORIENTATION from 03/17/2023 in [facility] CARDIAC REHABILITATION  Referring Provider Zoila Shutter MD       Encounter Date: 05/13/2023  Check In:  Session Check In - 05/13/23 1015       Check-In   Supervising physician immediately available to respond to emergencies See telemetry face sheet for immediately available ER MD    Location [facility]    Staff Present Ross Ludwig, BS, Exercise Physiologist;Danny Gala Romney, RN, Neal Dy, RN, BSN    Virtual Visit No    Medication changes reported     No    Fall or balance concerns reported    No    Warm-up and Cool-down Performed on first and last piece of equipment    Resistance Training Performed Yes    VAD Patient? No    PAD/SET Patient? No      Pain Assessment   Currently in Pain? No/denies    Pain Score 0-No pain    Multiple Pain Sites No             Capillary Blood Glucose: No results found for this or any previous visit (from the past 24 hour(s)).    Social History   Tobacco Use  Smoking Status Former   Types: Cigars   Quit date: 09/07/2011   Years since quitting: 11.6  Smokeless Tobacco Former   Quit date: 02/28/2012  Tobacco Comments   Former smoker 02/16/23    Goals Met:  Independence with exercise equipment Exercise tolerated well No report of concerns or symptoms today Strength training completed today  Goals Unmet:  Not Applicable  Comments: Pt able to follow exercise prescription today without complaint.  Will continue to monitor for progression.

## 2023-05-13 NOTE — Progress Notes (Signed)
  Electrophysiology Office Note:   Date:  05/13/2023  ID:  SAMIEL HEDQUIST, DOB April 30, 1946, MRN [id number]  Primary Cardiologist: [name], MD Electrophysiologist: [name], MD      History of Present Illness:   [name] [name] is a 77 y.o. male with h/o coronary artery disease post CABG, sleep apnea, chronic Solik heart failure, diabetes, hypertension, hyperlipidemia, atrial fibrillation post ablation 01/19/2023 seen today for routine electrophysiology followup.   Since last being seen in our clinic the patient reports doing overall well.  He is getting stronger.  He had a prior hospitalization with colonoscopy and biopsy with clips placed.  He had bleeding after this.  He has been recovering from that hospitalization.  He is going to cardiac rehab.  He feels well most days, though he does have fatigue.  He does feel like he is improving.  He Shanae Luo potentially need repeat colonoscopy in 6 months versus partial colectomy.  They are still trying to make a decision on which modality to choose.     he denies chest pain, palpitations, dyspnea, PND, orthopnea, nausea, vomiting, dizziness, syncope, edema, weight gain, or early satiety.   Review of systems complete and found to be negative unless listed in HPI.      EP Information / Studies Reviewed:    EKG is not ordered today. EKG from 04/27/23 reviewed which showed A sense, V pace      ICD Interrogation-  reviewed in detail today,  See PACEART report.  Device History: Medtronic BiV ICD implanted 06/30/2021 for CHF History of appropriate therapy: No History of AAD therapy: Yes; currently on amiodarone    Risk Assessment/Calculations:    CHA2DS2-VASc Score = 8   This indicates a 10.8% annual risk of stroke. The patient's score is based upon: CHF History: 1 HTN History: 1 Diabetes History: 1 Stroke History: 2 Vascular Disease History: 1 Age Score: 2 Gender Score: 0             Physical Exam:   VS:  BP (!)  110/56 (BP Location: Left Arm, Patient Position: Sitting, Cuff Size: Large)   Pulse 68   Ht 6\' 2"  (1.88 m)   Wt 209 lb 9.6 oz (95.1 kg)   SpO2 99%   BMI 26.91 kg/m    Wt Readings from Last 3 Encounters:  05/13/23 209 lb 9.6 oz (95.1 kg)  05/05/23 205 lb 12.8 oz (93.4 kg)  04/27/23 210 lb (95.3 kg)     GEN: Well nourished, well developed in no acute distress NECK: No JVD; No carotid bruits CARDIAC: Regular rate and rhythm, no murmurs, rubs, gallops RESPIRATORY:  Clear to auscultation without rales, wheezing or rhonchi  ABDOMEN: Soft, non-tender, non-distended EXTREMITIES:  No edema; No deformity   ASSESSMENT AND PLAN:    Chronic systolic dysfunction s/p Medtronic CRT-D  euvolemic today Stable on an appropriate medical regimen Normal ICD function See Pace Art report No changes today  2.  Persistent atrial fibrillation: Status post ablation 01/19/2023.  Pains in sinus rhythm.  No further episodes of atrial fibrillation.  Donye Dauenhauer stop amiodarone today.  3.  Secondary hypercoagulable date: Currently on Eliquis for atrial fibrillation  4.  Coronary artery disease: Status post CABG.  Plan per primary cardiology.  5.  Hypertension: Currently well-controlled  Disposition:   Follow up with EP APP in 6 months   Signed, Peyten Punches Jorja Loa, MD

## 2023-05-13 NOTE — Patient Instructions (Signed)
Medication Instructions:  Your physician has recommended you make the following change in your medication:  STOP Amiodarone  *If you need a refill on your cardiac medications before your next appointment, please call your pharmacy*   Lab Work: None ordered   Testing/Procedures: None ordered   Follow-Up: At [facility], you and your health needs are our priority.  As part of our continuing mission to provide you with exceptional heart care, we have created designated Provider Care Teams.  These Care Teams include your primary Cardiologist (physician) and Advanced Practice Providers (APPs -  Physician Assistants and Nurse Practitioners) who all work together to provide you with the care you need, when you need it.  Your next appointment:   6 month(s)  The format for your next appointment:   In Person  Provider:   You will see one of the following Advanced Practice Providers on your designated Care Team:   Francis Dowse, [place] "Mardelle Matte" [place], [place] Canary Brim, NP   Thank you for choosing [facility]!!   [phone]

## 2023-05-14 ENCOUNTER — Encounter: Payer: Self-pay | Admitting: Cardiology

## 2023-05-14 ENCOUNTER — Ambulatory Visit: Payer: Medicare Other | Attending: Cardiology | Admitting: Cardiology

## 2023-05-14 VITALS — BP 110/50 | HR 72 | Ht 74.0 in | Wt 204.0 lb

## 2023-05-14 DIAGNOSIS — Z95810 Presence of automatic (implantable) cardiac defibrillator: Secondary | ICD-10-CM

## 2023-05-14 DIAGNOSIS — I255 Ischemic cardiomyopathy: Secondary | ICD-10-CM | POA: Diagnosis not present

## 2023-05-14 DIAGNOSIS — I5022 Chronic systolic (congestive) heart failure: Secondary | ICD-10-CM | POA: Diagnosis not present

## 2023-05-14 DIAGNOSIS — Z01810 Encounter for preprocedural cardiovascular examination: Secondary | ICD-10-CM

## 2023-05-14 DIAGNOSIS — I4819 Other persistent atrial fibrillation: Secondary | ICD-10-CM

## 2023-05-14 DIAGNOSIS — I4811 Longstanding persistent atrial fibrillation: Secondary | ICD-10-CM

## 2023-05-14 DIAGNOSIS — Z79899 Other long term (current) drug therapy: Secondary | ICD-10-CM | POA: Diagnosis not present

## 2023-05-14 DIAGNOSIS — D6869 Other thrombophilia: Secondary | ICD-10-CM

## 2023-05-14 DIAGNOSIS — Z951 Presence of aortocoronary bypass graft: Secondary | ICD-10-CM

## 2023-05-14 NOTE — Patient Instructions (Signed)
Medication Instructions:  No changes *If you need a refill on your cardiac medications before your next appointment, please call your pharmacy*  Lab Work: Today we will draw a BMP  Testing/Procedures: No testing  Follow-Up: At [facility], you and your health needs are our priority.  As part of our continuing mission to provide you with exceptional heart care, we have created designated Provider Care Teams.  These Care Teams include your primary Cardiologist (physician) and Advanced Practice Providers (APPs -  Physician Assistants and Nurse Practitioners) who all work together to provide you with the care you need, when you need it.  We recommend signing up for the patient portal called "MyChart".  Sign up information is provided on this After Visit Summary.  MyChart is used to connect with patients for Virtual Visits (Telemedicine).  Patients are able to view lab/test results, encounter notes, upcoming appointments, etc.  Non-urgent messages can be sent to your provider as well.   To learn more about what you can do with MyChart, go to [email or web address].    Your next appointment:   6 week(s)  Provider:   [name], MD  or [name], NP

## 2023-05-15 LAB — BASIC METABOLIC PANEL
BUN/Creatinine Ratio: 15 (ref 10–24)
BUN: 20 mg/dL (ref 8–27)
CO2: 24 mmol/L (ref 20–29)
Calcium: 8.7 mg/dL (ref 8.6–10.2)
Chloride: 103 mmol/L (ref 96–106)
Creatinine, Ser: 1.3 mg/dL — ABNORMAL HIGH (ref 0.76–1.27)
Glucose: 170 mg/dL — ABNORMAL HIGH (ref 70–99)
Potassium: 4.2 mmol/L (ref 3.5–5.2)
Sodium: 142 mmol/L (ref 134–144)
eGFR: 56 mL/min/{1.73_m2} — ABNORMAL LOW (ref >59)

## 2023-05-17 ENCOUNTER — Encounter (INDEPENDENT_AMBULATORY_CARE_PROVIDER_SITE_OTHER): Payer: Medicare Other

## 2023-05-17 DIAGNOSIS — Z95810 Presence of automatic (implantable) cardiac defibrillator: Secondary | ICD-10-CM | POA: Diagnosis not present

## 2023-05-17 DIAGNOSIS — E1122 Type 2 diabetes mellitus with diabetic chronic kidney disease: Secondary | ICD-10-CM | POA: Diagnosis not present

## 2023-05-17 DIAGNOSIS — I5022 Chronic systolic (congestive) heart failure: Secondary | ICD-10-CM

## 2023-05-17 DIAGNOSIS — D638 Anemia in other chronic diseases classified elsewhere: Secondary | ICD-10-CM | POA: Diagnosis not present

## 2023-05-17 DIAGNOSIS — E7849 Other hyperlipidemia: Secondary | ICD-10-CM | POA: Diagnosis not present

## 2023-05-17 DIAGNOSIS — N183 Chronic kidney disease, stage 3 unspecified: Secondary | ICD-10-CM | POA: Diagnosis not present

## 2023-05-17 NOTE — Progress Notes (Signed)
EPIC Encounter for ICM Monitoring  Patient Name: [name] [name] is a 77 y.o. male Date: 05/17/2023 Primary Care Physican: [name], MD Primary Cardiologist: [facility]        Electrophysiologist: [name] Bi-V Pacing: 98.3%         09/21/2022 Weight: 213 lbs 10/06/2022 Weight: 219 lbs  11/17/2022 Weight: 212.5 lbs 12/22/2022 Weight: 211 lbs 01/13/2023 Weight: 209 lbs 05/14/2023 Office Weight: 204 lbs   Clinical Status Since 13-May-2023 Time in AT/AF  0.0 hr/day (0.0%)                                                          Spoke with patient and heart failure questions reviewed.  Transmission results reviewed.  Pt asymptomatic for fluid accumulation.     Optivol thoracic impedance suggesting possible fluid accumulation starting 11/14.    Prescribed:  Furosemide 40 mg take 1 tablet(s) (40 mg total) by mouth daily.  Take 1 tablet by mouth in the morning and 1 tablet by mouth in the afternoon as needed.   Labs: 05/14/2023 Creatinine 1.30, BUN 20, Potassium 4.2, Sodium 142, GFR 56 05/04/2024 Creatinine 1.30, BUN 18, Potassium 4.3, Sodium 142, GFR 56 04/27/2023 Creatinine 1.12, BUN 14, Potassium 4.5, Sodium 141, GFR 67, BNP 571.5 04/20/2023 Creatinine 1.07, BUN 12, Potassium 3.5, Sodium 134, GFR >60  04/17/2023 Creatinine 0.92, BUN 20, Potassium 3.7, Sodium 136, GFR >60  04/09/2023 Creatinine 1.06, BUN 22, Potassium 3.5, Sodium 136, GFR >60  01/05/2023 Creatinine 1.27, BUN 22, Potassium 4.2, Sodium 142, GFR 58 A complete set of results can be found in Results Review.   Recommendations:  Encouraged to limit salt and fluid intake.  He can take extra Lasix when needed.  No changes and encouraged to call if experiencing any fluid symptoms.   Follow-up plan: ICM clinic phone appointment on 06/28/2023.   91 day device clinic remote transmission 08/02/2023.     EP/Cardiology Office Visits:  Recall 08/02/2023 with Dr [name].   Recall 08/25/2023 with Dr [name].   Copy of ICM check sent to Dr.  [name].     3 month ICM trend: 05/17/2023.    12-14 Month ICM trend:     Karie Soda, RN 05/17/2023 2:20 PM

## 2023-05-18 ENCOUNTER — Encounter (HOSPITAL_COMMUNITY)
Admission: RE | Admit: 2023-05-18 | Discharge: 2023-05-18 | Disposition: A | Discharge status: Home / Self Care | Payer: Medicare Other | Source: Ambulatory Visit | Attending: Internal Medicine | Admitting: Internal Medicine

## 2023-05-18 ENCOUNTER — Telehealth: Payer: Self-pay

## 2023-05-18 DIAGNOSIS — D6869 Other thrombophilia: Secondary | ICD-10-CM | POA: Diagnosis not present

## 2023-05-18 DIAGNOSIS — Z95810 Presence of automatic (implantable) cardiac defibrillator: Secondary | ICD-10-CM | POA: Diagnosis not present

## 2023-05-18 DIAGNOSIS — I10 Essential (primary) hypertension: Secondary | ICD-10-CM | POA: Diagnosis not present

## 2023-05-18 DIAGNOSIS — I4819 Other persistent atrial fibrillation: Secondary | ICD-10-CM | POA: Diagnosis not present

## 2023-05-18 DIAGNOSIS — I2510 Atherosclerotic heart disease of native coronary artery without angina pectoris: Secondary | ICD-10-CM | POA: Diagnosis not present

## 2023-05-18 DIAGNOSIS — I5022 Chronic systolic (congestive) heart failure: Secondary | ICD-10-CM | POA: Diagnosis not present

## 2023-05-18 NOTE — Telephone Encounter (Signed)
-----   Message from [name] sent at 05/17/2023 12:18 PM EST ----- Please let [name] [name] know that his electrolytes are normal. His creatinine is slightly elevated, this indicates a slight decrease in kidney function would recommend he stay hydrated with water and try to only take lasix 40 mg in the morning unless he has weight gain of 2-3 lbs overnight or significant lower extremity edema.

## 2023-05-18 NOTE — Telephone Encounter (Signed)
 Patient called back advised of below they verbalized understanding

## 2023-05-18 NOTE — Telephone Encounter (Signed)
Left message to call back  

## 2023-05-18 NOTE — Progress Notes (Signed)
Daily Session Note  Patient Details  Name: [name] [name] MRN: [id number] Date of Birth: 12/22/1945 Referring Provider:   Flowsheet Row CARDIAC REHAB PHASE II ORIENTATION from 03/17/2023 in [facility] CARDIAC REHABILITATION  Referring Provider Zoila Shutter MD       Encounter Date: 05/18/2023  Check In:  Session Check In - 05/18/23 1030       Check-In   Supervising physician immediately available to respond to emergencies See telemetry face sheet for immediately available MD    Location [facility]    Staff Present Ross Ludwig, BS, Exercise Physiologist;Jessica Juanetta Gosling, MA, RCEP, CCRP, CCET;Other    Virtual Visit No    Medication changes reported     No    Fall or balance concerns reported    No    Tobacco Cessation No Change    Warm-up and Cool-down Performed on first and last piece of equipment    Resistance Training Performed Yes    VAD Patient? No    PAD/SET Patient? No      Pain Assessment   Currently in Pain? No/denies    Pain Score 0-No pain    Multiple Pain Sites No             Capillary Blood Glucose: No results found for this or any previous visit (from the past 24 hour(s)).    Social History   Tobacco Use  Smoking Status Former   Types: Cigars   Quit date: 09/07/2011   Years since quitting: 11.7  Smokeless Tobacco Former   Quit date: 02/28/2012  Tobacco Comments   Former smoker 02/16/23    Goals Met:  Independence with exercise equipment Exercise tolerated well No report of concerns or symptoms today Strength training completed today  Goals Unmet:  Not Applicable  Comments: Pt able to follow exercise prescription today without complaint.  Will continue to monitor for progression.

## 2023-05-20 ENCOUNTER — Ambulatory Visit (INDEPENDENT_AMBULATORY_CARE_PROVIDER_SITE_OTHER): Payer: Medicare Other

## 2023-05-20 ENCOUNTER — Encounter (HOSPITAL_COMMUNITY)
Admission: RE | Admit: 2023-05-20 | Discharge: 2023-05-20 | Disposition: A | Discharge status: Home / Self Care | Payer: Medicare Other | Source: Ambulatory Visit | Attending: Internal Medicine | Admitting: Internal Medicine

## 2023-05-20 DIAGNOSIS — I5022 Chronic systolic (congestive) heart failure: Secondary | ICD-10-CM | POA: Diagnosis not present

## 2023-05-20 DIAGNOSIS — I4819 Other persistent atrial fibrillation: Secondary | ICD-10-CM | POA: Diagnosis not present

## 2023-05-20 DIAGNOSIS — I10 Essential (primary) hypertension: Secondary | ICD-10-CM | POA: Diagnosis not present

## 2023-05-20 DIAGNOSIS — I2510 Atherosclerotic heart disease of native coronary artery without angina pectoris: Secondary | ICD-10-CM | POA: Diagnosis not present

## 2023-05-20 DIAGNOSIS — Z95810 Presence of automatic (implantable) cardiac defibrillator: Secondary | ICD-10-CM | POA: Diagnosis not present

## 2023-05-20 DIAGNOSIS — D6869 Other thrombophilia: Secondary | ICD-10-CM | POA: Diagnosis not present

## 2023-05-20 NOTE — Progress Notes (Signed)
Daily Session Note  Patient Details  Name: ALEXXANDER BRINDLE MRN: [id number] Date of Birth: [birth year] Referring Provider:   Flowsheet Row CARDIAC REHAB PHASE II ORIENTATION from 03/17/2023 in [facility] CARDIAC REHABILITATION  Referring Provider Zoila Shutter MD       Encounter Date: 05/20/2023  Check In:  Session Check In - 05/20/23 1030       Check-In   Supervising physician immediately available to respond to emergencies See telemetry face sheet for immediately available MD    Location [facility]    Staff Present Ross Ludwig, BS, Exercise Physiologist;Other    Virtual Visit No    Medication changes reported     No    Fall or balance concerns reported    No    Tobacco Cessation No Change    Warm-up and Cool-down Performed on first and last piece of equipment    Resistance Training Performed Yes    VAD Patient? No    PAD/SET Patient? No      Pain Assessment   Currently in Pain? No/denies    Pain Score 0-No pain    Multiple Pain Sites No             Capillary Blood Glucose: No results found for this or any previous visit (from the past 24 hour(s)).    Social History   Tobacco Use  Smoking Status Former   Types: Cigars   Quit date: 09/07/2011   Years since quitting: 11.7  Smokeless Tobacco Former   Quit date: 02/28/2012  Tobacco Comments   Former smoker 02/16/23    Goals Met:  Independence with exercise equipment Exercise tolerated well No report of concerns or symptoms today Strength training completed today  Goals Unmet:  Not Applicable  Comments: Pt able to follow exercise prescription today without complaint.  Will continue to monitor for progression.

## 2023-05-21 DIAGNOSIS — I5022 Chronic systolic (congestive) heart failure: Secondary | ICD-10-CM | POA: Diagnosis not present

## 2023-05-21 DIAGNOSIS — C189 Malignant neoplasm of colon, unspecified: Secondary | ICD-10-CM | POA: Diagnosis not present

## 2023-05-21 DIAGNOSIS — K922 Gastrointestinal hemorrhage, unspecified: Secondary | ICD-10-CM | POA: Diagnosis not present

## 2023-05-21 DIAGNOSIS — D509 Iron deficiency anemia, unspecified: Secondary | ICD-10-CM | POA: Diagnosis not present

## 2023-05-24 ENCOUNTER — Ambulatory Visit: Payer: Medicare Other | Admitting: Neurology

## 2023-05-25 ENCOUNTER — Encounter (HOSPITAL_COMMUNITY): Payer: Medicare Other

## 2023-05-31 NOTE — Progress Notes (Signed)
Remote ICD transmission.   

## 2023-06-01 ENCOUNTER — Encounter (HOSPITAL_COMMUNITY)
Admission: RE | Admit: 2023-06-01 | Discharge: 2023-06-01 | Disposition: A | Discharge status: Home / Self Care | Payer: Medicare Other | Source: Ambulatory Visit | Attending: Internal Medicine | Admitting: Internal Medicine

## 2023-06-01 DIAGNOSIS — Z95810 Presence of automatic (implantable) cardiac defibrillator: Secondary | ICD-10-CM | POA: Diagnosis not present

## 2023-06-01 DIAGNOSIS — I5022 Chronic systolic (congestive) heart failure: Secondary | ICD-10-CM | POA: Insufficient documentation

## 2023-06-01 NOTE — Progress Notes (Signed)
Daily Session Note  Patient Details  Name: [name] [name] MRN: [id number] Date of Birth: [birth year] Referring Provider:   Flowsheet Row CARDIAC REHAB PHASE II ORIENTATION from 03/17/2023 in [facility] CARDIAC REHABILITATION  Referring Provider Zoila Shutter MD       Encounter Date: 06/01/2023  Check In:  Session Check In - 06/01/23 1030       Check-In   Supervising physician immediately available to respond to emergencies See telemetry face sheet for immediately available MD    Location [facility]    Staff Present Ross Ludwig, BS, Exercise Physiologist;Danny Gala Romney, RN, BSN;Jessica Hawkins, MA, RCEP, CCRP, CCET    Virtual Visit No    Medication changes reported     No    Fall or balance concerns reported    No    Warm-up and Cool-down Performed on first and last piece of equipment    Resistance Training Performed Yes    VAD Patient? No      Pain Assessment   Currently in Pain? No/denies    Pain Score 0-No pain    Multiple Pain Sites No             Capillary Blood Glucose: No results found for this or any previous visit (from the past 24 hour(s)).    Social History   Tobacco Use  Smoking Status Former   Types: Cigars   Quit date: 09/07/2011   Years since quitting: 11.7  Smokeless Tobacco Former   Quit date: 02/28/2012  Tobacco Comments   Former smoker 02/16/23    Goals Met:  Independence with exercise equipment Exercise tolerated well No report of concerns or symptoms today Strength training completed today  Goals Unmet:  Not Applicable  Comments: Pt able to follow exercise prescription today without complaint.  Will continue to monitor for progression.

## 2023-06-02 ENCOUNTER — Encounter (HOSPITAL_COMMUNITY): Payer: Self-pay | Admitting: *Deleted

## 2023-06-02 DIAGNOSIS — I5022 Chronic systolic (congestive) heart failure: Secondary | ICD-10-CM

## 2023-06-02 NOTE — Progress Notes (Signed)
Cardiac Individual Treatment Plan  Patient Details  Name: [name] [name] MRN: [id number] Date of Birth: [birth year] Referring Provider:   Flowsheet Row CARDIAC REHAB PHASE II ORIENTATION from 03/17/2023 in [facility] CARDIAC REHABILITATION  Referring Provider Zoila Shutter MD       Initial Encounter Date:  Flowsheet Row CARDIAC REHAB PHASE II ORIENTATION from 03/17/2023 in [place] CARDIAC REHABILITATION  Date 03/17/23       Visit Diagnosis: Heart failure, chronic systolic (HCC)  Patient's Home Medications on Admission:  Current Outpatient Medications:    acetaminophen (TYLENOL) 500 MG tablet, Take 500-1,000 mg by mouth every 6 (six) hours as needed (for pain.)., Disp: , Rfl:    apixaban (ELIQUIS) 5 MG TABS tablet, Take 1 tablet (5 mg total) by mouth 2 (two) times daily., Disp: , Rfl:    empagliflozin (JARDIANCE) 25 MG TABS tablet, Take 1 tablet (25 mg total) by mouth daily., Disp: 90 tablet, Rfl: 3   furosemide (LASIX) 40 MG tablet, Take 1 tablet (40 mg total) by mouth daily. Take 1  tablet by mouth in the morning and 1 tablet by mouth in the afternoon as needed., Disp: , Rfl:    gabapentin (NEURONTIN) 300 MG capsule, Take 600 mg by mouth at bedtime., Disp: , Rfl:    metFORMIN (GLUCOPHAGE) 1000 MG tablet, Take 1,000 mg by mouth 2 times daily at 12 noon and 4 pm., Disp: , Rfl:    metFORMIN (GLUCOPHAGE-XR) 500 MG 24 hr tablet, Take 1,000 mg by mouth 2 (two) times daily., Disp: , Rfl:    midodrine (PROAMATINE) 5 MG tablet, Take 1 tablet (5 mg total) by mouth as needed. For systolic blood pressure <95, Disp: 30 tablet, Rfl: 2   ONETOUCH VERIO test strip, 1 each daily., Disp: , Rfl:    polyethylene glycol (MIRALAX / GLYCOLAX) 17 g packet, Take 17 g by mouth daily as needed for mild constipation., Disp: , Rfl:    rosuvastatin (CRESTOR) 10 MG tablet, Take 1 tablet (10 mg total) by mouth every other day., Disp: 50 tablet, Rfl: 3   senna-docusate (SENOKOT-S) 8.6-50 MG tablet, Take 1 tablet  by mouth at bedtime as needed for mild constipation., Disp: , Rfl:   Past Medical History: Past Medical History:  Diagnosis Date   AICD (automatic cardioverter/defibrillator) present    Arthritis    Knee L - probably   Atrial fibrillation (HCC)    BPH (benign prostatic hyperplasia)    CAD (coronary artery disease) 07/06/2011   CHF (congestive heart failure) (HCC)    Coronary artery disease    Diabetes mellitus    Frequency of urination    Heart failure (HCC)    High cholesterol    History of nuclear stress test 09/2010   dipyridamole; mild perfusion defect due to attenuation with mild superimposed ischemia in apical septal, apical, apical inferior & apical lateral regions; rest LV enlarged in size; prominent gut uptake in infero-apical region; no significant ischemia demonstrated; low risk scan    HNP (herniated nucleus pulposus), lumbar    Hypertension    Ischemic cardiomyopathy    LV dysfunction 07/06/2011   Nocturia    Right bundle branch block    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 Wayland Salinas   Sleep apnea    sleep study 10/2010- AHI during total sleep 32.1/hr and during REM 62.3/hr (severe sleep apnea)unable to tolerate c pap  Spinal stenosis of lumbar region    Stroke (HCC)    [place]     Tobacco Use: Social History   Tobacco Use  Smoking Status Former   Types: Cigars   Quit date: 09/07/2011   Years since quitting: 11.7  Smokeless Tobacco Former   Quit date: 02/28/2012  Tobacco Comments   Former smoker 02/16/23    Labs: Review Flowsheet  More data exists      Latest Ref Rng & Units 10/17/2020 11/14/2020 01/02/2021 08/14/2021 12/10/2021  Labs for ITP Cardiac and Pulmonary Rehab  Cholestrol 100 - 199 mg/dL 166  - - 063  98   LDL (calc) 0 - 99 mg/dL 016  - - 010  46   HDL-C >39 mg/dL 36  - - 38  35   Trlycerides 0 - 149 mg/dL 97  - - 86  88   Hemoglobin A1c 4.8 - 5.6 % 7.0  -  - - -  TCO2 22 - 32 mmol/L - 26  25  - -    Details            Capillary Blood Glucose: Lab Results  Component Value Date   GLUCAP 197 (H) 04/21/2023   GLUCAP 163 (H) 04/21/2023   GLUCAP 156 (H) 04/21/2023   GLUCAP 145 (H) 04/21/2023   GLUCAP 166 (H) 04/20/2023     Exercise Target Goals: Exercise Program Goal: Individual exercise prescription set using results from initial 6 min walk test and THRR while considering  patient's activity barriers and safety.   Exercise Prescription Goal: Starting with aerobic activity 30 plus minutes a day, 3 days per week for initial exercise prescription. Provide home exercise prescription and guidelines that participant acknowledges understanding prior to discharge.  Activity Barriers & Risk Stratification:  Activity Barriers & Cardiac Risk Stratification - 03/17/23 1546       Activity Barriers & Cardiac Risk Stratification   Activity Barriers History of Falls;Balance Concerns;Arthritis;Muscular Weakness;Deconditioning;Shortness of Breath;Decreased Ventricular Function    Cardiac Risk Stratification High             6 Minute Walk:  6 Minute Walk     Row Name 03/17/23 1545         6 Minute Walk   Phase Initial     Distance 733 feet     Walk Time 5.03 minutes     # of Rest Breaks 2  21 sec, 36 sec     MPH 1.66     METS 1.41     RPE 14     Perceived Dyspnea  3     VO2 Peak 4.93     Symptoms Yes (comment)     Comments SOB, walking with limp from leg pain "poor circulation", fatigue across shoulders     Resting HR 51 bpm     Resting BP 100/50     Resting Oxygen Saturation  97 %     Exercise Oxygen Saturation  during 6 min walk 98 %     Max Ex. HR 87 bpm     Max Ex. BP 126/74     2 Minute Post BP 114/56              Oxygen Initial Assessment:   Oxygen Re-Evaluation:   Oxygen Discharge (Final Oxygen Re-Evaluation):   Initial Exercise Prescription:  Initial Exercise Prescription - 03/17/23 1500        Date of Initial Exercise RX and Referring Provider   Date 03/17/23  Referring Provider Zoila Shutter MD      Oxygen   Maintain Oxygen Saturation 88% or higher      NuStep   Level 1    SPM 80    Minutes 15    METs 2      REL-XR   Level 1    Speed 50    Minutes 15    METs 2      Prescription Details   Frequency (times per week) 2    Duration Progress to 30 minutes of continuous aerobic without signs/symptoms of physical distress      Intensity   THRR 40-80% of Max Heartrate 87-124    Ratings of Perceived Exertion 11-13    Perceived Dyspnea 0-4      Progression   Progression Continue to progress workloads to maintain intensity without signs/symptoms of physical distress.      Resistance Training   Training Prescription Yes    Weight 4 lb    Reps 10-15             Perform Capillary Blood Glucose checks as needed.  Exercise Prescription Changes:   Exercise Prescription Changes     Row Name 03/17/23 1500 03/30/23 1200 04/27/23 1500         Response to Exercise   Blood Pressure (Admit) 100/50 100/50 120/60     Blood Pressure (Exercise) 126/74 98/56 --     Blood Pressure (Exit) 114/56 100/56 118/62     Heart Rate (Admit) 51 bpm 59 bpm 75 bpm     Heart Rate (Exercise) 87 bpm 109 bpm 93 bpm     Heart Rate (Exit) 57 bpm 58 bpm 82 bpm     Oxygen Saturation (Admit) 97 % -- --     Oxygen Saturation (Exercise) 98 % -- --     Rating of Perceived Exertion (Exercise) 14 12 12      Perceived Dyspnea (Exercise) 3 -- --     Symptoms SOB, walking with limp from "poor circulation", fatigue across shoulders -- --     Comments walk test results -- --     Duration -- Continue with 30 min of aerobic exercise without signs/symptoms of physical distress. Continue with 30 min of aerobic exercise without signs/symptoms of physical distress.     Intensity -- THRR unchanged THRR unchanged       Progression   Progression -- Continue to progress workloads to maintain intensity  without signs/symptoms of physical distress. Continue to progress workloads to maintain intensity without signs/symptoms of physical distress.       Resistance Training   Training Prescription -- Yes Yes     Weight -- 4 lbs 4lbs     Reps -- 10-15 10-15       NuStep   Level -- 2 2     SPM -- 92 91     Minutes -- 15 15     METs -- 2.2 2.2       REL-XR   Level -- 2 2     Speed -- 48 45     Minutes -- 15 15     METs -- 2.2 2       Oxygen   Maintain Oxygen Saturation -- 88% or higher 88% or higher              Exercise Comments:   Exercise Goals and Review:   Exercise Goals     Row Name 03/17/23 1550  Exercise Goals   Increase Physical Activity Yes       Intervention Provide advice, education, support and counseling about physical activity/exercise needs.;Develop an individualized exercise prescription for aerobic and resistive training based on initial evaluation findings, risk stratification, comorbidities and participant's personal goals.       Expected Outcomes Short Term: Attend rehab on a regular basis to increase amount of physical activity.;Long Term: Add in home exercise to make exercise part of routine and to increase amount of physical activity.;Long Term: Exercising regularly at least 3-5 days a week.       Increase Strength and Stamina Yes       Intervention Provide advice, education, support and counseling about physical activity/exercise needs.;Develop an individualized exercise prescription for aerobic and resistive training based on initial evaluation findings, risk stratification, comorbidities and participant's personal goals.       Expected Outcomes Short Term: Increase workloads from initial exercise prescription for resistance, speed, and METs.;Short Term: Perform resistance training exercises routinely during rehab and add in resistance training at home;Long Term: Improve cardiorespiratory fitness, muscular endurance and strength as measured  by increased METs and functional capacity ( )       Able to understand and use rate of perceived exertion (RPE) scale Yes       Intervention Provide education and explanation on how to use RPE scale       Expected Outcomes Short Term: Able to use RPE daily in rehab to express subjective intensity level;Long Term:  Able to use RPE to guide intensity level when exercising independently       Able to understand and use Dyspnea scale Yes       Intervention Provide education and explanation on how to use Dyspnea scale       Expected Outcomes Short Term: Able to use Dyspnea scale daily in rehab to express subjective sense of shortness of breath during exertion;Long Term: Able to use Dyspnea scale to guide intensity level when exercising independently       Knowledge and understanding of Target Heart Rate Range (THRR) Yes       Intervention Provide education and explanation of THRR including how the numbers were predicted and where they are located for reference       Expected Outcomes Short Term: Able to state/look up THRR;Long Term: Able to use THRR to govern intensity when exercising independently;Short Term: Able to use daily as guideline for intensity in rehab       Able to check pulse independently Yes       Intervention Provide education and demonstration on how to check pulse in carotid and radial arteries.;Review the importance of being able to check your own pulse for safety during independent exercise       Expected Outcomes Short Term: Able to explain why pulse checking is important during independent exercise;Long Term: Able to check pulse independently and accurately       Understanding of Exercise Prescription Yes       Intervention Provide education, explanation, and written materials on patient's individual exercise prescription       Expected Outcomes Short Term: Able to explain program exercise prescription;Long Term: Able to explain home exercise prescription to exercise independently                 Exercise Goals Re-Evaluation :  Exercise Goals Re-Evaluation     Row Name 03/30/23 1259 04/27/23 1118 05/19/23 1538         Exercise Goal Re-Evaluation  Exercise Goals Review Increase Physical Activity;Increase Strength and Stamina;Understanding of Exercise Prescription Increase Physical Activity;Increase Strength and Stamina;Understanding of Exercise Prescription Increase Physical Activity;Increase Strength and Stamina;Understanding of Exercise Prescription     Comments Derly has been tolerating exericse well. He is on his 4th session and has already increased his levels on bith the NuStep and XR to level 2. Will continue to monitor and progress as able. Kashif is doing well in rehab.  He was out sick last week.  He is starting to feel better overall and this strength and stamina are starting to improve.  He is walking some at home. Hodges has been doing well in rehab.  He is feeling better overall and feels like his stamina is starting to improve again.  When he is not in rehab, he is doing some stretching at home.  He was encouraged to add in more walking to continue to build up his stamina.     Expected Outcomes Short term: continue to increase SPM and RPM on worklaods   long term: continue to attend rehab Short: Review home exercise guidelines Long: continue to improve stamina short: Add in more walking at home Long: contineu to improve stamina               Discharge Exercise Prescription (Final Exercise Prescription Changes):  Exercise Prescription Changes - 04/27/23 1500       Response to Exercise   Blood Pressure (Admit) 120/60    Blood Pressure (Exit) 118/62    Heart Rate (Admit) 75 bpm    Heart Rate (Exercise) 93 bpm    Heart Rate (Exit) 82 bpm    Rating of Perceived Exertion (Exercise) 12    Duration Continue with 30 min of aerobic exercise without signs/symptoms of physical distress.    Intensity THRR unchanged      Progression   Progression  Continue to progress workloads to maintain intensity without signs/symptoms of physical distress.      Resistance Training   Training Prescription Yes    Weight 4lbs    Reps 10-15      NuStep   Level 2    SPM 91    Minutes 15    METs 2.2      REL-XR   Level 2    Speed 45    Minutes 15    METs 2      Oxygen   Maintain Oxygen Saturation 88% or higher             Nutrition:  Target Goals: Understanding of nutrition guidelines, daily intake of sodium 1500mg , cholesterol 200mg , calories 30% from fat and 7% or less from saturated fats, daily to have 5 or more servings of fruits and vegetables.  Biometrics:  Pre Biometrics - 03/17/23 1551       Pre Biometrics   Height 6' 0.5" (1.842 m)    Weight 205 lb 3.2 oz (93.1 kg)    Waist Circumference 40 inches    Hip Circumference 41.5 inches    Waist to Hip Ratio 0.96 %    BMI (Calculated) 27.43    Grip Strength 17.9 kg    Single Leg Stand 1.8 seconds              Nutrition Therapy Plan and Nutrition Goals:   Nutrition Assessments:  MEDIFICTS Score Key: >=70 Need to make dietary changes  40-70 Heart Healthy Diet <= 40 Therapeutic Level Cholesterol Diet  Flowsheet Row CARDIAC VIRTUAL BASED CARE from 03/12/2023 in [place]  [facility]  Picture Your Plate Total Score on Admission 31      Picture Your Plate Scores: <57 Unhealthy dietary pattern with much room for improvement. 41-50 Dietary pattern unlikely to meet recommendations for good health and room for improvement. 51-60 More healthful dietary pattern, with some room for improvement.  >60 Healthy dietary pattern, although there may be some specific behaviors that could be improved.    Nutrition Goals Re-Evaluation:  Nutrition Goals Re-Evaluation     Row Name 04/28/23 1121 05/19/23 1540           Goals   Nutrition Goal Heart Healthy Diet Short: Continue to work toward heart healthy diet changes Long: continue to watch sodium       Comment God is doing well in rehab.  He has started to work on his diet.  But he didn't have an appetite until recently. He is trying to be good about watching sodium and sugars.  He has gotten good at checking labels for sodium levels. Myson is doing well in rehab. He has been wroking on his diet.  He is watching sodium and trying to get in more fruits and vegetables.  He will continue to work on protion control and adding in variety.      Expected Outcome Short: Continue to work toward heart healthy diet changes Long: continue to watch sodium Short: Conitnue to add in more variety Long; Conitnue to eat heart healthy               Nutrition Goals Discharge (Final Nutrition Goals Re-Evaluation):  Nutrition Goals Re-Evaluation - 05/19/23 1540       Goals   Nutrition Goal Short: Continue to work toward heart healthy diet changes Long: continue to watch sodium    Comment Standley is doing well in rehab. He has been wroking on his diet.  He is watching sodium and trying to get in more fruits and vegetables.  He will continue to work on protion control and adding in variety.    Expected Outcome Short: Conitnue to add in more variety Long; Conitnue to eat heart healthy             Psychosocial: Target Goals: Acknowledge presence or absence of significant depression and/or stress, maximize coping skills, provide positive support system. Participant is able to verbalize types and ability to use techniques and skills needed for reducing stress and depression.  Initial Review & Psychosocial Screening:  Initial Psych Review & Screening - 03/12/23 1034       Initial Review   Current issues with None Identified      Family Dynamics   Good Support System? Yes      Barriers   Psychosocial barriers to participate in program There are no identifiable barriers or psychosocial needs.;The patient should benefit from training in stress management and relaxation.      Screening Interventions    Interventions Encouraged to exercise;To provide support and resources with identified psychosocial needs;Provide feedback about the scores to participant    Expected Outcomes Short Term goal: Utilizing psychosocial counselor, staff and physician to assist with identification of specific Stressors or current issues interfering with healing process. Setting desired goal for each stressor or current issue identified.;Long Term Goal: Stressors or current issues are controlled or eliminated.;Short Term goal: Identification and review with participant of any Quality of Life or Depression concerns found by scoring the questionnaire.;Long Term goal: The participant improves quality of Life and PHQ9 Scores as seen by post  scores and/or verbalization of changes             Quality of Life Scores:  Quality of Life - 03/12/23 0904       Quality of Life   Select Quality of Life      Quality of Life Scores   Health/Function Pre 19.83 %    Socioeconomic Pre 24.38 %    Psych/Spiritual Pre 18.86 %    Family Pre 27.6 %    GLOBAL Pre 21.79 %            Scores of 19 and below usually indicate a poorer quality of life in these areas.  A difference of  2-3 points is a clinically meaningful difference.  A difference of 2-3 points in the total score of the Quality of Life Index has been associated with significant improvement in overall quality of life, self-image, physical symptoms, and general health in studies assessing change in quality of life.  PHQ-9: Review Flowsheet       03/17/2023 05/01/2021 02/13/2021 12/11/2020  Depression screen PHQ 2/9  Decreased Interest 0 0 0 0  Down, Depressed, Hopeless 0 0 0 0  PHQ - 2 Score 0 0 0 0  Altered sleeping 1 - - -  Tired, decreased energy 1 - - -  Change in appetite 1 - - -  Feeling bad or failure about yourself  0 - - -  Trouble concentrating 0 - - -  Moving slowly or fidgety/restless 0 - - -  Suicidal thoughts 0 - - -  PHQ-9 Score 3 - - -  Difficult  doing work/chores Somewhat difficult - - -    Details           Interpretation of Total Score  Total Score Depression Severity:  1-4 = Minimal depression, 5-9 = Mild depression, 10-14 = Moderate depression, 15-19 = Moderately severe depression, 20-27 = Severe depression   Psychosocial Evaluation and Intervention:  Psychosocial Evaluation - 03/12/23 1038       Psychosocial Evaluation & Interventions   Interventions Stress management education;Relaxation education;Encouraged to exercise with the program and follow exercise prescription    Comments Patient referred to CR with chronic systolic HF. He had CABG in 2013 and an ICD placed in 2023. He had an ablation to treat A-Fib/A-Flutter 7/23. He denies any depression, anxiety or stressors in his life. He says he sleeps well with a BIPAP. He has been retired for several years. He worked as a Buyer, retail. He lives alone but says he has great support from his neighbors and friends. He has a daughter that lives out of town but does check on him a lot. He is currently doing outpatient PT working on his balance and gait and strengthening. He plans to continue doing PT while participating in CR. He had 2 falls in May falling backwards hitting his head causing subdural hematoma and was treated in the ED both times. He says he has a cane and walker but does not use them. He enjoys playing golf but is not playing as much as he would like to. His goals for the program are to get stronger; improve his balance; and be able to do more activities without getting tired. He has no barriers identified to complete CR.    Expected Outcomes Short Term: Start the program and attend consistently. Long Term: meet his personal goals.    Continue Psychosocial Services  Follow up required by staff  Psychosocial Re-Evaluation:  Psychosocial Re-Evaluation     Row Name 04/28/23 1120 05/19/23 1539           Psychosocial Re-Evaluation   Current  issues with Current Stress Concerns Current Stress Concerns      Comments Jed was out sick last week after having a bleeding issue with his routine colonoscopy.  He ended up spending a few days in the hospital to get back to normal.  Other wise, he is doing well mentally and tries not to let things get to him too much.  He sleeps well most of the time. Avonte is doing well in rehab and feeling much better overall.  He is sleepign good and feels like he has gotten to a good place mentally.  He has really enjoyed the routine of the program and getting out and about more.      Expected Outcomes Short: Continue to exercise for mental boost Long; Continue to stay positive Short: continue to attend to build stamina Long: Continue to exercise for mental boost      Interventions Encouraged to attend Cardiac Rehabilitation for the exercise Encouraged to attend Cardiac Rehabilitation for the exercise      Continue Psychosocial Services  Follow up required by staff Follow up required by staff               Psychosocial Discharge (Final Psychosocial Re-Evaluation):  Psychosocial Re-Evaluation - 05/19/23 1539       Psychosocial Re-Evaluation   Current issues with Current Stress Concerns    Comments Dejesus is doing well in rehab and feeling much better overall.  He is sleepign good and feels like he has gotten to a good place mentally.  He has really enjoyed the routine of the program and getting out and about more.    Expected Outcomes Short: continue to attend to build stamina Long: Continue to exercise for mental boost    Interventions Encouraged to attend Cardiac Rehabilitation for the exercise    Continue Psychosocial Services  Follow up required by staff             Vocational Rehabilitation: Provide vocational rehab assistance to qualifying candidates.   Vocational Rehab Evaluation & Intervention:  Vocational Rehab - 03/12/23 1031       Initial Vocational Rehab Evaluation & Intervention    Assessment shows need for Vocational Rehabilitation No      Vocational Rehab Re-Evaulation   Comments Patient is retired.             Education: Education Goals: Education classes will be provided on a weekly basis, covering required topics. Participant will state understanding/return demonstration of topics presented.  Learning Barriers/Preferences:  Learning Barriers/Preferences - 03/12/23 1031       Learning Barriers/Preferences   Learning Barriers None    Learning Preferences Audio;Written Material;Pictoral;Skilled Demonstration             Education Topics: Hypertension, Hypertension Reduction -Define heart disease and high blood pressure. Discus how high blood pressure affects the body and ways to reduce high blood pressure.   Exercise and Your Heart -Discuss why it is important to exercise, the FITT principles of exercise, normal and abnormal responses to exercise, and how to exercise safely. Flowsheet Row CARDIAC REHAB PHASE II EXERCISE from 05/20/2023 in [place] CARDIAC REHABILITATION  Date 03/25/23  Educator HB  Instruction Review Code 1- Verbalizes Understanding       Angina -Discuss definition of angina, causes of angina, treatment of angina, and how  to decrease risk of having angina.   Cardiac Medications -Review what the following cardiac medications are used for, how they affect the body, and side effects that may occur when taking the medications.  Medications include Aspirin, Beta blockers, calcium channel blockers, ACE Inhibitors, angiotensin receptor blockers, diuretics, digoxin, and antihyperlipidemics. Flowsheet Row CARDIAC REHAB PHASE II EXERCISE from 05/20/2023 in [place] CARDIAC REHABILITATION  Date 05/06/23  Educator hb  Instruction Review Code 1- Verbalizes Understanding       Congestive Heart Failure -Discuss the definition of CHF, how to live with CHF, the signs and symptoms of CHF, and how keep track of weight and sodium  intake. Flowsheet Row CARDIAC REHAB PHASE II EXERCISE from 05/20/2023 in [place] CARDIAC REHABILITATION  Date 04/29/23  Educator hb  Instruction Review Code 1- Verbalizes Understanding       Heart Disease and Intimacy -Discus the effect sexual activity has on the heart, how changes occur during intimacy as we age, and safety during sexual activity. Flowsheet Row CARDIAC REHAB PHASE II EXERCISE from 05/20/2023 in [place] CARDIAC REHABILITATION  Date 04/01/23  Educator HB  Instruction Review Code 1- Verbalizes Understanding       Smoking Cessation / COPD -Discuss different methods to quit smoking, the health benefits of quitting smoking, and the definition of COPD.   Nutrition I: Fats -Discuss the types of cholesterol, what cholesterol does to the heart, and how cholesterol levels can be controlled.   Nutrition II: Labels -Discuss the different components of food labels and how to read food label   Heart Parts/Heart Disease and PAD -Discuss the anatomy of the heart, the pathway of blood circulation through the heart, and these are affected by heart disease. Flowsheet Row CARDIAC REHAB PHASE II EXERCISE from 05/20/2023 in [place] CARDIAC REHABILITATION  Date 05/20/23  Educator hb  Instruction Review Code 1- Verbalizes Understanding       Stress I: Signs and Symptoms -Discuss the causes of stress, how stress may lead to anxiety and depression, and ways to limit stress.   Stress II: Relaxation -Discuss different types of relaxation techniques to limit stress.   Warning Signs of Stroke / TIA -Discuss definition of a stroke, what the signs and symptoms are of a stroke, and how to identify when someone is having stroke.   Knowledge Questionnaire Score:  Knowledge Questionnaire Score - 03/12/23 0906       Knowledge Questionnaire Score   Pre Score 21/26             Core Components/Risk Factors/Patient Goals at Admission:  Personal Goals and Risk  Factors at Admission - 03/17/23 1551       Core Components/Risk Factors/Patient Goals on Admission    Weight Management Yes;Weight Maintenance;Weight Loss    Intervention Weight Management: Develop a combined nutrition and exercise program designed to reach desired caloric intake, while maintaining appropriate intake of nutrient and fiber, sodium and fats, and appropriate energy expenditure required for the weight goal.;Weight Management: Provide education and appropriate resources to help participant work on and attain dietary goals.    Admit Weight 205 lb 3.2 oz (93.1 kg)    Goal Weight: Short Term 200 lb (90.7 kg)    Goal Weight: Long Term 198 lb (89.8 kg)    Expected Outcomes Short Term: Continue to assess and modify interventions until short term weight is achieved;Long Term: Adherence to nutrition and physical activity/exercise program aimed toward attainment of established weight goal;Weight Maintenance: Understanding of the daily nutrition  guidelines, which includes 25-35% calories from fat, 7% or less cal from saturated fats, less than 200mg  cholesterol, less than 1.5gm of sodium, & 5 or more servings of fruits and vegetables daily;Weight Loss: Understanding of general recommendations for a balanced deficit meal plan, which promotes 1-2 lb weight loss per week and includes a negative energy balance of [phone] kcal/d;Understanding recommendations for meals to include 15-35% energy as protein, 25-35% energy from fat, 35-60% energy from carbohydrates, less than 200mg  of dietary cholesterol, 20-35 gm of total fiber daily;Understanding of distribution of calorie intake throughout the day with the consumption of 4-5 meals/snacks    Diabetes Yes    Intervention Provide education about signs/symptoms and action to take for hypo/hyperglycemia.;Provide education about proper nutrition, including hydration, and aerobic/resistive exercise prescription along with prescribed medications to achieve blood  glucose in normal ranges: Fasting glucose 65-99 mg/dL    Expected Outcomes Short Term: Participant verbalizes understanding of the signs/symptoms and immediate care of hyper/hypoglycemia, proper foot care and importance of medication, aerobic/resistive exercise and nutrition plan for blood glucose control.;Long Term: Attainment of HbA1C < 7%.    Heart Failure Yes    Intervention Provide a combined exercise and nutrition program that is supplemented with education, support and counseling about heart failure. Directed toward relieving symptoms such as shortness of breath, decreased exercise tolerance, and extremity edema.    Expected Outcomes Improve functional capacity of life;Short term: Attendance in program 2-3 days a week with increased exercise capacity. Reported lower sodium intake. Reported increased fruit and vegetable intake. Reports medication compliance.;Short term: Daily weights obtained and reported for increase. Utilizing diuretic protocols set by physician.;Long term: Adoption of self-care skills and reduction of barriers for early signs and symptoms recognition and intervention leading to self-care maintenance.    Hypertension Yes    Intervention Provide education on lifestyle modifcations including regular physical activity/exercise, weight management, moderate sodium restriction and increased consumption of fresh fruit, vegetables, and low fat dairy, alcohol moderation, and smoking cessation.;Monitor prescription use compliance.    Expected Outcomes Short Term: Continued assessment and intervention until BP is < 140/24mm HG in hypertensive participants. < 130/64mm HG in hypertensive participants with diabetes, heart failure or chronic kidney disease.;Long Term: Maintenance of blood pressure at goal levels.    Lipids Yes    Intervention Provide education and support for participant on nutrition & aerobic/resistive exercise along with prescribed medications to achieve LDL 70mg , HDL >40mg .     Expected Outcomes Short Term: Participant states understanding of desired cholesterol values and is compliant with medications prescribed. Participant is following exercise prescription and nutrition guidelines.;Long Term: Cholesterol controlled with medications as prescribed, with individualized exercise RX and with personalized nutrition plan. Value goals: LDL < 70mg , HDL > 40 mg.             Core Components/Risk Factors/Patient Goals Review:   Goals and Risk Factor Review     Row Name 04/28/23 1129 05/19/23 1545           Core Components/Risk Factors/Patient Goals Review   Personal Goals Review Weight Management/Obesity;Diabetes;Lipids;Hypertension;Heart Failure Weight Management/Obesity;Diabetes;Lipids;Hypertension;Heart Failure      Review Deforest is doing well in rehab.  His weight has been staying steady.  He is feeling good overall.  His pressures are doing well and he checks them routinely and gets 120s/60s. He is now off his carvedilol as his pressures were too low.  He is feeling better overall.  He was going in for a follow up appt  from hospitalization  for bleeding and hoping to get a good reveiw.  His review was good. Arata is doing well in rehab.  His weight is steady.  He is now off the amiodarone and back in rhythm and feeling good.  His pressures are doing well and has not had any swings with his sugars.  he is feeling good overall. He has not had any heart failure symptoms recently      Expected Outcomes Continue to stay in contact with doctor about bleeding Long: Continue to montior risk factors SHort: Continue to do self checks for heart failure Long; Conitnue to monitor risk factors               Core Components/Risk Factors/Patient Goals at Discharge (Final Review):   Goals and Risk Factor Review - 05/19/23 1545       Core Components/Risk Factors/Patient Goals Review   Personal Goals Review Weight Management/Obesity;Diabetes;Lipids;Hypertension;Heart Failure     Review Aidden is doing well in rehab.  His weight is steady.  He is now off the amiodarone and back in rhythm and feeling good.  His pressures are doing well and has not had any swings with his sugars.  he is feeling good overall. He has not had any heart failure symptoms recently    Expected Outcomes SHort: Continue to do self checks for heart failure Long; Conitnue to monitor risk factors             ITP Comments:  ITP Comments     Row Name 03/17/23 1554 03/23/23 1040 04/07/23 1549 05/05/23 0805 06/02/23 1115   ITP Comments Patient arrived for 1st visit/orientation/education at 1400. Patient was referred to CR by Dr. Zoila Shutter due to Chronic systolic HF. During orientation advised patient on arrival and appointment times what to wear, what to do before, during and after exercise. Reviewed attendance and class policy.  Pt is scheduled to return Cardiac Rehab on Tuesday 03/23/23 at 1330. Pt was advised to come to class 15 minutes before class starts.  Discussed RPE/Dpysnea scales. Patient participated in warm up stretches. Patient was able to complete 6 minute walk test.  Telemetry:Ventricular Paced Rhythm. Patient was measured for the equipment. Discussed equipment safety with patient. Took patient pre-anthropometric measurements. Patient finished visit at 1510. First full day of exercise!  Patient was oriented to gym and equipment including functions, settings, policies, and procedures.  Patient's individual exercise prescription and treatment plan were reviewed.  All starting workloads were established based on the results of the 6 minute walk test done at initial orientation visit.  The plan for exercise progression was also introduced and progression will be customized based on patient's performance and goals. 30 day review completed. ITP sent to Dr. Dina Rich, Medical Director of Cardiac Rehab. Continue with ITP unless changes are made by physician. 30 day review completed. ITP sent to  Dr. Dina Rich, Medical Director of Cardiac Rehab. Continue with ITP unless changes are made by physician. 30 day review completed. ITP sent to Dr. Dina Rich, Medical Director of Cardiac Rehab. Continue with ITP unless changes are made by physician.            Comments: 30 day review

## 2023-06-03 ENCOUNTER — Encounter (HOSPITAL_COMMUNITY)
Admission: RE | Admit: 2023-06-03 | Discharge: 2023-06-03 | Disposition: A | Discharge status: Home / Self Care | Payer: Medicare Other | Source: Ambulatory Visit | Attending: Internal Medicine | Admitting: Internal Medicine

## 2023-06-03 DIAGNOSIS — E11621 Type 2 diabetes mellitus with foot ulcer: Secondary | ICD-10-CM | POA: Diagnosis not present

## 2023-06-03 DIAGNOSIS — I5022 Chronic systolic (congestive) heart failure: Secondary | ICD-10-CM

## 2023-06-03 DIAGNOSIS — E1165 Type 2 diabetes mellitus with hyperglycemia: Secondary | ICD-10-CM | POA: Diagnosis not present

## 2023-06-03 DIAGNOSIS — Z95810 Presence of automatic (implantable) cardiac defibrillator: Secondary | ICD-10-CM | POA: Diagnosis not present

## 2023-06-03 DIAGNOSIS — I255 Ischemic cardiomyopathy: Secondary | ICD-10-CM | POA: Diagnosis not present

## 2023-06-03 DIAGNOSIS — N401 Enlarged prostate with lower urinary tract symptoms: Secondary | ICD-10-CM | POA: Diagnosis not present

## 2023-06-03 DIAGNOSIS — K5901 Slow transit constipation: Secondary | ICD-10-CM | POA: Diagnosis not present

## 2023-06-03 DIAGNOSIS — I10 Essential (primary) hypertension: Secondary | ICD-10-CM | POA: Diagnosis not present

## 2023-06-03 DIAGNOSIS — I5033 Acute on chronic diastolic (congestive) heart failure: Secondary | ICD-10-CM | POA: Diagnosis not present

## 2023-06-03 DIAGNOSIS — E1122 Type 2 diabetes mellitus with diabetic chronic kidney disease: Secondary | ICD-10-CM | POA: Diagnosis not present

## 2023-06-03 DIAGNOSIS — F411 Generalized anxiety disorder: Secondary | ICD-10-CM | POA: Diagnosis not present

## 2023-06-03 DIAGNOSIS — D509 Iron deficiency anemia, unspecified: Secondary | ICD-10-CM | POA: Diagnosis not present

## 2023-06-03 DIAGNOSIS — I739 Peripheral vascular disease, unspecified: Secondary | ICD-10-CM | POA: Diagnosis not present

## 2023-06-03 DIAGNOSIS — R5383 Other fatigue: Secondary | ICD-10-CM | POA: Diagnosis not present

## 2023-06-03 DIAGNOSIS — M5432 Sciatica, left side: Secondary | ICD-10-CM | POA: Diagnosis not present

## 2023-06-03 DIAGNOSIS — G4733 Obstructive sleep apnea (adult) (pediatric): Secondary | ICD-10-CM | POA: Diagnosis not present

## 2023-06-03 DIAGNOSIS — Z23 Encounter for immunization: Secondary | ICD-10-CM | POA: Diagnosis not present

## 2023-06-03 NOTE — Progress Notes (Signed)
Daily Session Note  Patient Details  Name: [name] [name] MRN: [id number] Date of Birth: 11/30/1945 Referring Provider:   Flowsheet Row CARDIAC REHAB PHASE II ORIENTATION from 03/17/2023 in [facility] CARDIAC REHABILITATION  Referring Provider Zoila Shutter MD       Encounter Date: 06/03/2023  Check In:  Session Check In - 06/03/23 1015       Check-In   Supervising physician immediately available to respond to emergencies See telemetry face sheet for immediately available ER MD    Location [facility]    Staff Present Rodena Medin, RN, BSN;Heather Fredric Mare, BS, Exercise Physiologist    Virtual Visit No    Medication changes reported     No    Fall or balance concerns reported    No    Warm-up and Cool-down Performed on first and last piece of equipment    Resistance Training Performed Yes    VAD Patient? No    PAD/SET Patient? No      Pain Assessment   Currently in Pain? No/denies    Pain Score 0-No pain    Multiple Pain Sites No             Capillary Blood Glucose: No results found for this or any previous visit (from the past 24 hour(s)).    Social History   Tobacco Use  Smoking Status Former   Types: Cigars   Quit date: 09/07/2011   Years since quitting: 11.7  Smokeless Tobacco Former   Quit date: 02/28/2012  Tobacco Comments   Former smoker 02/16/23    Goals Met:  Independence with exercise equipment Exercise tolerated well No report of concerns or symptoms today Strength training completed today  Goals Unmet:  Not Applicable  Comments: Pt able to follow exercise prescription today without complaint.  Will continue to monitor for progression.

## 2023-06-07 ENCOUNTER — Telehealth: Payer: Self-pay

## 2023-06-07 NOTE — Telephone Encounter (Signed)
Mandy, please call patient and schedule appointment in January with Dr. [name]. Thanks!

## 2023-06-07 NOTE — Telephone Encounter (Signed)
Returned call to patient as requested by voice mail message.  He reports feeling SOB on minimal exertion, very tired, small amount of leg swelling and 3-4 lb weight gain over the last couple of weeks.  Current weight is 209 lbs (was 204 lbs on 11/15).    He has been taking extra Furosemide x 4 days, 40 mg twice a day but no relief of symptoms.    He sent remote transmission today for review.   Optivol suggesting possible fluid accumulation since 11/15 which correlates with symptoms at this time.    Advised would send to Dr [name] for review since taking Furosemide 40 mg twice a day x 4 days have not relieved fluid symptoms.     Scheduled for OV with [name], NP on 12/23.  Copy sent to Dr [name] for review and recommendations if needed.

## 2023-06-08 ENCOUNTER — Encounter (HOSPITAL_COMMUNITY): Payer: Medicare Other

## 2023-06-08 NOTE — Telephone Encounter (Signed)
Patient returned call. Explained MD recommendations based on optivol readings and his symptoms. He would like to try Furoscix. Clinical pharmacy team has been notified. Patient advised to continue lasix until able to obtain this new medication.

## 2023-06-08 NOTE — Telephone Encounter (Signed)
Left message to call back  MyChart message sent [name], MD  You; Short, Josephine Igo, RN; [name] D, NP3 hours ago (1:04 PM)    Maryclare Labrador try to get him in sooner than 12/23 to be seen - he may be a candidate to use Furoscix in the office since the higher dose lasix is not working well orally.  Dr. [name]

## 2023-06-09 ENCOUNTER — Telehealth: Payer: Self-pay | Admitting: Pharmacist Clinician (PhC)/ Clinical Pharmacy Specialist

## 2023-06-09 MED ORDER — FUROSCIX 80 MG/10ML ~~LOC~~ CTKT: CARTRIDGE | SUBCUTANEOUS | Status: DC

## 2023-06-09 NOTE — Telephone Encounter (Signed)
Patient stopped at [facility] office to pick up sample of Furoscix and brought to [facility] office for training.  Reviewed medication purpose, and showed with sample infusor, how to use.  Patient was able to apply the infusor to his abdomen.  Chose to wait until closer to home to hit the start button, as he has 45 + minute drive from the office.     Answered all questions.

## 2023-06-09 NOTE — Telephone Encounter (Signed)
Sample of Furoscix at [facility] office. Spoke with patient. He will go to [facility] to pick up sample and then go to [facility] to [facility] for assistance with med. MyChart message sent with instructions/address

## 2023-06-09 NOTE — Telephone Encounter (Signed)
[name], MD  Lindell Spar, RN Caller: Unspecified (2 days ago,  3:06 PM) No po lasix with this - think of it like IV lasix. Would like to see weight loss, improved swelling by Friday, otherwise will probably send him to the hospital.  DR H

## 2023-06-09 NOTE — Telephone Encounter (Signed)
Sample picked up. 

## 2023-06-09 NOTE — Telephone Encounter (Signed)
Patient stopped by [facility] office this afternoon, showed him how to use the infusor and applied.  He will hit the "start" button a little closer to home - has 45 minute drive

## 2023-06-10 ENCOUNTER — Telehealth: Payer: Self-pay | Admitting: Internal Medicine

## 2023-06-10 ENCOUNTER — Encounter (HOSPITAL_COMMUNITY)
Admission: RE | Admit: 2023-06-10 | Discharge: 2023-06-10 | Disposition: A | Discharge status: Home / Self Care | Payer: Medicare Other | Source: Ambulatory Visit | Attending: Internal Medicine | Admitting: Internal Medicine

## 2023-06-10 DIAGNOSIS — Z95810 Presence of automatic (implantable) cardiac defibrillator: Secondary | ICD-10-CM | POA: Diagnosis not present

## 2023-06-10 DIAGNOSIS — I5022 Chronic systolic (congestive) heart failure: Secondary | ICD-10-CM

## 2023-06-10 DIAGNOSIS — Z79899 Other long term (current) drug therapy: Secondary | ICD-10-CM

## 2023-06-10 DIAGNOSIS — R0602 Shortness of breath: Secondary | ICD-10-CM

## 2023-06-10 NOTE — Telephone Encounter (Signed)
I called patient 12/11 to see about making an appointment with Dr. [name] and he said that he would be out of the state in Jan. And Feb.  He then called back and left a message to say that maybe he misspoke about not scheduling the appointment.  I called back this morning, 12/12, and left a message to see if he indeed wanted to schedule an appointment.

## 2023-06-10 NOTE — Progress Notes (Signed)
Daily Session Note  Patient Details  Name: ALPER NOEL MRN: [id number] Date of Birth: April 01, 1946 Referring Provider:   Flowsheet Row CARDIAC REHAB PHASE II ORIENTATION from 03/17/2023 in [facility] CARDIAC REHABILITATION  Referring Provider Zoila Shutter MD       Encounter Date: 06/10/2023  Check In:  Session Check In - 06/10/23 1015       Check-In   Supervising physician immediately available to respond to emergencies See telemetry face sheet for immediately available ER MD    Location [facility]    Staff Present Rodena Medin, RN, BSN;Heather Fredric Mare, BS, Exercise Physiologist;Other   Ernestene Mention, NT   Virtual Visit No    Medication changes reported     No    Fall or balance concerns reported    No    Tobacco Cessation Use Decreased    Warm-up and Cool-down Performed on first and last piece of equipment    Resistance Training Performed Yes    VAD Patient? No    PAD/SET Patient? No      Pain Assessment   Currently in Pain? No/denies    Pain Score 0-No pain    Multiple Pain Sites No             Capillary Blood Glucose: No results found for this or any previous visit (from the past 24 hours).    Social History   Tobacco Use  Smoking Status Former   Types: Cigars   Quit date: 09/07/2011   Years since quitting: 11.7  Smokeless Tobacco Former   Quit date: 02/28/2012  Tobacco Comments   Former smoker 02/16/23    Goals Met:  Independence with exercise equipment Exercise tolerated well No report of concerns or symptoms today Strength training completed today  Goals Unmet:  Not Applicable  Comments: Pt able to follow exercise prescription today without complaint.  Will continue to monitor for progression.

## 2023-06-10 NOTE — Telephone Encounter (Signed)
Reached out to [name] [name] this afternoon by telephone -he used Furoscix yesterday - he reports feeling "So much better" - he says he lost 1.5 lbs since yesterday. He was approved for 3 additional doses - they will be delivered tomorrow. I advised him to use another dose tomorrow and see if we can get him back to his dry weight - he may or may not need a 3rd or 4th dose.    Dr. [name]

## 2023-06-14 NOTE — Addendum Note (Signed)
Addended by: Lindell Spar on: 06/14/2023 02:08 PM   Modules accepted: Orders

## 2023-06-15 ENCOUNTER — Encounter (HOSPITAL_COMMUNITY)
Admission: RE | Admit: 2023-06-15 | Discharge: 2023-06-15 | Disposition: A | Discharge status: Home / Self Care | Payer: Medicare Other | Source: Ambulatory Visit | Attending: Internal Medicine | Admitting: Internal Medicine

## 2023-06-15 DIAGNOSIS — Z95810 Presence of automatic (implantable) cardiac defibrillator: Secondary | ICD-10-CM | POA: Diagnosis not present

## 2023-06-15 DIAGNOSIS — I5022 Chronic systolic (congestive) heart failure: Secondary | ICD-10-CM

## 2023-06-15 NOTE — Progress Notes (Deleted)
Referring Provider: [name], MD Primary Care Physician:  [name], MD Primary GI Physician: Dr. Bonnetta Eisa chief complaint on file.   HPI:   [name] [name] is a 77 y.o. 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 [name] at [facility] surgery ***  He did experience post polypectomy bleed after colonoscopy with hemoglobin dropping as low as 8.3 receiving 1 unit PRBCs and having clips placed in his colon at sites of post polypectomy ulcerations.  Most recent labs 05/05/2023 with hemoglobin improved to 10.2, iron saturation low at 8% and iron 33.  Recommended continuing oral iron daily.   Today:    Past Medical History:  Diagnosis Date   AICD (automatic cardioverter/defibrillator) present    Arthritis    Knee L - probably   Atrial fibrillation (HCC)    BPH (benign prostatic hyperplasia)    CAD (coronary artery disease) 07/06/2011   CHF (congestive heart failure) (HCC)    Coronary artery disease    Diabetes mellitus    Frequency of urination    Heart failure (HCC)    High cholesterol    History of nuclear stress test 09/2010   dipyridamole; mild perfusion defect due to attenuation with mild superimposed ischemia in apical septal, apical, apical inferior & apical lateral regions; rest LV enlarged in size; prominent gut uptake in infero-apical region; no significant ischemia demonstrated; low risk scan    HNP (herniated nucleus pulposus), lumbar    Hypertension    Ischemic cardiomyopathy    LV dysfunction 07/06/2011   Nocturia    Right bundle branch block    S/P CABG (coronary artery bypass graft) 08/03/2011   x3; LIMA to LAD,, SVG to PDA, SVG to  posterolateral branch of RCA; Dr. Wayland Salinas   Sleep apnea    sleep study 10/2010- AHI during total sleep 32.1/hr and during REM 62.3/hr (severe sleep apnea)unable to tolerate c pap   Spinal stenosis of lumbar region    Stroke [facility])    [place]     Past Surgical History:  Procedure Laterality Date   ABDOMINAL AORTOGRAM W/LOWER EXTREMITY Left 11/14/2020   Procedure: ABDOMINAL AORTOGRAM W/LOWER EXTREMITY;  Surgeon: Cephus Shelling, MD;  Location: [facility] INVASIVE CV LAB;  Service: Cardiovascular;  Laterality: Left;   ABDOMINAL AORTOGRAM W/LOWER EXTREMITY N/A 01/02/2021   Procedure: ABDOMINAL AORTOGRAM W/LOWER EXTREMITY;  Surgeon: Cephus Shelling, MD;  Location: [facility] INVASIVE CV LAB;  Service: Cardiovascular;  Laterality: N/A;   AMPUTATION Left 03/20/2021   Procedure: Left foot 5th ray amputation;  Surgeon: Toni Arthurs, MD;  Location: [facility] OR;  Service: Orthopedics;  Laterality: Left;   ATRIAL FIBRILLATION ABLATION N/A 01/19/2023   Procedure: ATRIAL FIBRILLATION ABLATION;  Surgeon: [name], MD;  Location: [facility] INVASIVE CV LAB;  Service: Cardiovascular;  Laterality: N/A;   BIOPSY  04/14/2023   Procedure: BIOPSY;  Surgeon: Corbin Ade, MD;  Location: [facility] ENDO SUITE;  Service: Endoscopy;;   BIV ICD INSERTION CRT-D N/A 04/30/2022   Procedure: BIV ICD INSERTION CRT-D;  Surgeon: [name], MD;  Location: [facility] INVASIVE CV LAB;  Service: Cardiovascular;  Laterality: N/A;   CARDIAC CATHETERIZATION  01/2011   ischemic cardiomyopathy 30-35%, multivessel CAD (Dr. Bishop Limbo)    CARDIAC CATHETERIZATION  09/13/2015   Procedure: Right/Left Heart Cath and Coronary/Graft Angiography;  Surgeon: [name], MD;  Location: [facility] INVASIVE CV LAB;  Service: Cardiovascular;;   CARDIOVERSION N/A 05/16/2019   Procedure: CARDIOVERSION;  Surgeon: [name], MD;  Location: [facility] ENDOSCOPY;  Service: Cardiovascular;  Laterality: N/A;   CARDIOVERSION N/A 10/16/2022   Procedure: CARDIOVERSION;   Surgeon: Thurmon Fair, MD;  Location: [facility] INVASIVE CV LAB;  Service: Cardiovascular;  Laterality: N/A;   CARDIOVERSION N/A 12/16/2022   Procedure: CARDIOVERSION;  Surgeon: Little Ishikawa, MD;  Location: [facility] INVASIVE CV LAB;  Service: Cardiovascular;  Laterality: N/A;   COLONOSCOPY WITH PROPOFOL N/A 04/14/2023   Procedure: COLONOSCOPY WITH PROPOFOL;  Surgeon: Corbin Ade, MD;  Location: [facility];  Service: Endoscopy;  Laterality: N/A;  11:00am, asa 3/4   COLONOSCOPY WITH PROPOFOL N/A 04/18/2023   Procedure: COLONOSCOPY WITH PROPOFOL;  Surgeon: Dolores Frame, MD;  Location: [facility];  Service: Gastroenterology;  Laterality: N/A;   Colonscopy     CORONARY ARTERY BYPASS GRAFT  08/03/2011   Procedure: CORONARY ARTERY BYPASS GRAFTING (CABG);  Surgeon: Alleen Borne, MD;  Location: [facility] OR;  Service: Open Heart Surgery;  Laterality: N/A;  CABG times three using right saphenous vein and left mammary artery usisng endoscope.   ESOPHAGEAL BRUSHING  04/14/2023   Procedure: ESOPHAGEAL BRUSHING;  Surgeon: Corbin Ade, MD;  Location: [facility];  Service: Endoscopy;;   ESOPHAGOGASTRODUODENOSCOPY (EGD) WITH PROPOFOL N/A 04/14/2023   Procedure: ESOPHAGOGASTRODUODENOSCOPY (EGD) WITH PROPOFOL;  Surgeon: Corbin Ade, MD;  Location: [facility];  Service: Endoscopy;  Laterality: N/A;   HEMOSTASIS CLIP PLACEMENT  04/14/2023   Procedure: HEMOSTASIS CLIP PLACEMENT;  Surgeon: Corbin Ade, MD;  [facility];  Service: Endoscopy;;   LEFT HEART CATH AND CORS/GRAFTS ANGIOGRAPHY N/A 12/15/2021   Procedure: LEFT HEART CATH AND CORS/GRAFTS ANGIOGRAPHY;  Surgeon: Tonny Bollman, MD;  Location: [facility] INVASIVE CV LAB;  Service: Cardiovascular;  Laterality: N/A;   LEFT HEART CATHETERIZATION WITH CORONARY/GRAFT ANGIOGRAM N/A 09/20/2013   Procedure: LEFT HEART CATHETERIZATION WITH Isabel Caprice;  Surgeon: [name], MD;  Location: [facility] CATH LAB;  Service:  Cardiovascular;  Laterality: N/A;   Lower Extremity Arterial Doppler  03/16/2013   bilat ABIs demonstated normal values; R runoff - posterior tibial & anterior tibial arteries occluded; L runoff - peroneal & posterior tibial arteries occluded, anterior tibial artery appears occluded   LUMBAR LAMINECTOMY/DECOMPRESSION MICRODISCECTOMY N/A 09/12/2014   Procedure: LUMBAR DECOMPRESSION L3-L4, L4-L5, MICRODISCECTOMY L4-L5;  Surgeon: Jene Every, MD;  Location: [facility] ORS;  Service: Orthopedics;  Laterality: N/A;   PERIPHERAL VASCULAR BALLOON ANGIOPLASTY  11/14/2020   Procedure: PERIPHERAL VASCULAR BALLOON ANGIOPLASTY;  Surgeon: Cephus Shelling, MD;  Location: [facility] INVASIVE CV LAB;  Service: Cardiovascular;;   PERIPHERAL VASCULAR BALLOON ANGIOPLASTY Left 01/02/2021   Procedure: PERIPHERAL VASCULAR BALLOON ANGIOPLASTY;  Surgeon: Cephus Shelling, MD;  Location: [facility] INVASIVE CV LAB;  Service: Cardiovascular;  Laterality: Left;  Failed PTA   Pilonidal Cyst removed     POLYPECTOMY  04/14/2023   Procedure: POLYPECTOMY INTESTINAL;  Surgeon: Corbin Ade, MD;  Location: [facility];  Service: Endoscopy;;   SCLEROTHERAPY  04/14/2023   Procedure: [name];  Surgeon: Corbin Ade, MD;  [facility];  Service: Endoscopy;;   SUBMUCOSAL LIFTING INJECTION  04/14/2023   Procedure: SUBMUCOSAL LIFTING INJECTION;  Surgeon: Corbin Ade, MD;  Location[facility];  Service: Endoscopy;;   SUBMUCOSAL TATTOO INJECTION  04/14/2023   Procedure: SUBMUCOSAL TATTOO INJECTION;  Surgeon: Corbin Ade, MD;  Location: [facility];  Service: Endoscopy;;   TRANSTHORACIC ECHOCARDIOGRAM  11/10/2012   EF 40-45%, mild LVH, mild hypokinesis of anteroseptal myocardium, grade 1 diastolic dysfunction; mild MR & calcifed mitral annulus; LA mild-mod dilated; RA mildly dilated    Current Outpatient Medications  Medication Sig Dispense Refill   acetaminophen (TYLENOL) 500 MG tablet Take 500-1,000 mg by mouth  every 6 (six) hours as needed (for pain.).     apixaban (ELIQUIS) 5 MG TABS tablet Take 1 tablet (5 mg total) by mouth 2 (two) times daily.     empagliflozin (JARDIANCE) 25 MG TABS tablet Take 1 tablet (25 mg total) by mouth daily. 90 tablet 3   Furosemide (FUROSCIX) 80 MG/10ML CTKT Apply 1 time     furosemide (LASIX) 40 MG tablet Take 1 tablet (40 mg total) by mouth daily. Take 1  tablet by mouth in the morning and 1 tablet by mouth in the afternoon as needed.     gabapentin (NEURONTIN) 300 MG capsule Take 600 mg by mouth at bedtime.     metFORMIN (GLUCOPHAGE) 1000 MG tablet Take 1,000 mg by mouth 2 times daily at 12 noon and 4 pm.     metFORMIN (GLUCOPHAGE-XR) 500 MG 24 hr tablet Take 1,000 mg by mouth 2 (two) times daily.     midodrine (PROAMATINE) 5 MG tablet Take 1 tablet (5 mg total) by mouth as needed. For systolic blood pressure <95 30 tablet 2   ONETOUCH VERIO test strip 1 each daily.     polyethylene glycol (MIRALAX / GLYCOLAX) 17 g packet Take 17 g by mouth daily as needed for mild constipation.     rosuvastatin (CRESTOR) 10 MG tablet Take 1 tablet (10 mg total) by mouth every other day. 50 tablet 3   senna-docusate (SENOKOT-S) 8.6-50 MG tablet Take 1 tablet by mouth at bedtime as needed for mild constipation.     No current facility-administered medications for this visit.    Allergies as of 06/17/2023 - Review Complete 05/14/2023  Allergen Reaction Noted   Entresto [sacubitril-valsartan] Other (See Comments) 02/25/2017   Sacubitril Other (See Comments) 10/05/2017    Family History  Problem Relation Age of Onset   Cancer Mother    Lung disease Father        & heart disease   Colon polyps Father    Colon cancer Sister    Sudden death Maternal Grandmother    Anesthesia problems Daughter     Social History   Socioeconomic History   Marital status: Widowed    Spouse name: Not on file   Number of children: 1   Years of education: Not on file   Highest education level:  Not on file  Occupational History   Occupation: real Psychologist, occupational  Tobacco Use   Smoking status: Former    Types: Cigars    Quit date: 09/07/2011    Years since quitting: 11.7   Smokeless tobacco: Former    Quit date: 02/28/2012   Tobacco comments:    Former smoker 02/16/23  Vaping Use   Vaping status: Never Used  Substance and Sexual Activity   Alcohol use: Not Currently    Alcohol/week: 1.0 - 2.0 standard drink of alcohol    Types: 1 - 2 Cans of beer per week   Drug use: No   Sexual activity: Not on file  Other Topics Concern   Not on file  Social History Narrative   ** Merged History Encounter **       Social Drivers of Corporate investment banker Strain: Not on file  Food Insecurity: No Food Insecurity (04/17/2023)   Hunger Vital Sign    Worried About Running Out of Food in the Last Year: Never true    Ran Out of Food in the Last Year: Never true  Transportation Needs: No Transportation Needs (04/17/2023)   PRAPARE - Administrator, Civil Service (Medical): No    Lack of Transportation (Non-Medical): No  Physical Activity: Unknown (05/05/2023)   Received from [facility]   PCARE Exercise SDOH    Exercise: Active Lifestyle Only;Physical Therapy    PCare Exercise SDOH: Not on file    PCare Exercise SDOH: Not on file  Stress: Not on file  Social Connections: Not on file    Review of Systems: Gen: Denies fever, chills, anorexia. Denies fatigue, weakness, weight loss.  CV: Denies chest pain, palpitations, syncope, peripheral edema, and claudication. Resp: Denies dyspnea at rest, cough, wheezing, coughing up blood, and pleurisy. GI: Denies vomiting blood, jaundice, and fecal incontinence.   Denies dysphagia or odynophagia. Derm: Denies rash, itching, dry skin Psych: Denies depression, anxiety, memory loss, confusion. No homicidal or suicidal ideation.  Heme: Denies bruising, bleeding, and enlarged lymph nodes.  Physical Exam: There were  no vitals taken for this visit. General:   Alert and oriented. No distress noted. Pleasant and cooperative.  Head:  Normocephalic and atraumatic. Eyes:  Conjuctiva clear without scleral icterus. Heart:  S1, S2 present without murmurs appreciated. Lungs:  Clear to auscultation bilaterally. No wheezes, rales, or rhonchi. No distress.  Abdomen:  +BS, soft, non-tender and non-distended. No rebound or guarding. No HSM or masses noted. Msk:  Symmetrical without gross deformities. Normal posture. Extremities:  Without edema. Neurologic:  Alert and  oriented x4 Psych:  Normal mood and affect.    Assessment:     Plan:  ***   Ermalinda Memos, PA-C [facility]astro[facility] 06/17/2023

## 2023-06-15 NOTE — Progress Notes (Signed)
Daily Session Note  Patient Details  Name: [name] [name] MRN: [id number] Date of Birth: 05-20-1946 Referring Provider:   Flowsheet Row CARDIAC REHAB PHASE II ORIENTATION from 03/17/2023 in [facility] CARDIAC REHABILITATION  Referring Provider Zoila Shutter MD       Encounter Date: 06/15/2023  Check In:  Session Check In - 06/15/23 1030       Check-In   Supervising physician immediately available to respond to emergencies See telemetry face sheet for immediately available ER MD    Location [facility]    Staff Present Ross Ludwig, BS, Exercise Physiologist;Vittorio Mohs, RN;Jessica St. Joseph, MA, RCEP, CCRP, CCET    Virtual Visit No    Medication changes reported     No    Fall or balance concerns reported    No    Tobacco Cessation --    Warm-up and Cool-down Performed on first and last piece of equipment    Resistance Training Performed Yes    VAD Patient? No    PAD/SET Patient? No      Pain Assessment   Currently in Pain? No/denies    Pain Score 0-No pain    Multiple Pain Sites No             Capillary Blood Glucose: No results found for this or any previous visit (from the past 24 hours).    Social History   Tobacco Use  Smoking Status Former   Types: Cigars   Quit date: 09/07/2011   Years since quitting: 11.7  Smokeless Tobacco Former   Quit date: 02/28/2012  Tobacco Comments   Former smoker 02/16/23    Goals Met:  Independence with exercise equipment Exercise tolerated well No report of concerns or symptoms today Strength training completed today  Goals Unmet:  Not Applicable  Comments: Pt able to follow exercise prescription today without complaint.  Will continue to monitor for progression.

## 2023-06-16 NOTE — Progress Notes (Unsigned)
Cardiology Office Note    Date:  06/21/2023  ID:  TRETON WENZINGER, DOB 1945-07-11, MRN [id number] PCP:  [name], MD  Cardiologist:  [name], MD  Electrophysiologist:  [name], MD   Chief Complaint: Follow up for ischemic cardiomyopathy   History of Present Illness: .    [name] [name] is a 77 y.o. male with visit-pertinent history of CAD s/p CABG in 2013 with ischemic cardiomyopathy-EF 20-25% prior to surgery, improved to 40-45% by 10/2012, now 30-35%, mild MR, PAD, DM and PAF on chronic anticoagulation s/p ablation. Reduction in EF led to ICD insertion in 2023.      Heart catheterization in 08/2013 showed patent LIMA to LAD, SVG-PLA, SVG-PDA.  Most recent heart catheterization in 11/2021 showed patent LIMA to LAD but occlusion of both SVG grafts.  Given wall motion abnormality on echocardiogram, viability study was recommended to help make decisions regarding complex atherectomy and intervention of the left circumflex that would involve stenting back of the left main and covering the long segment throughout the circumflex to treat multiple severe calcific lesions.  Cardiac MRI was pursued and showed EF 20% and severe thinning in the apical to basal lateral wall and apical inferior wall suggesting nonviability in these regions.  Therefore complex intervention was not pursued.  He was treated medically.  Given persistent reduction in EF he underwent BiV ICD insertion CRT-D on 04/30/2022.   In March 2024 he reported worsening shortness of breath.  Device interrogation showed possible fluid accumulation in atrial fibrillation.  Diuretic was increased and EKG showed underlying A-fib with ventricularly paced rhythm and PVCs.  He presented for scheduled outpatient cardioversion on 10/16/2022 and was successfully cardioverted to a paced BiV rhythm.  He was seen by Dr. [name] on 10/27/2022 and was back in afib, he was started on amiodarone.   Cardiology was consulted in 10/2022 while  patient was admitted in setting of mechanical fall resulting in subdural hematoma 4 mm without any mass effect.  After discharge he fell again, he resumed Eliquis on 11/24/22. On 12/23/2022 he presented for DCCV which was successful.  On 01/19/23 he underwent afib ablation with Dr. [name], on follow up with afib clinic on 8/20 he reported no further afib. He was last seen in clinic on 02/26/23 by Dr. [name], it was noted that he had lost 50 lbs over the past year, he had an abnormal cologuard test with his PCP.      On 04/17/23 patient underwent colonoscopy, was found to have multiple scattered diverticula as well as 3 polyps that were clipped with cold snare's.  Following he had normal bowel movements until 2 days after colonoscopy when he noticed bright red blood from his rectum during bowel movements.  He presented to the ED on 04/19/2023, blood pressures were soft and stools positive for blood.  His hemoglobin at baseline is around 11, decreased to 8.3.  He received 1 unit of packed red blood cells, hemoglobin proved to 9.4.  He underwent repeat colonoscopy while inpatient showing a single post polypectomy ulcer in the cecum.  He had multiple clips placed at previous polypectomy.  While inpatient he was found to be orthostatic, he was given a 500 mL fluid bolus, his Lasix was held while inpatient.  He was started on midodrine 5 mg 3 times daily.  His Coreg was discontinued, he was instructed to resume Eliquis on 04/26/2023.   On 04/22/2023 he had a device check which showed 0% A-fib  burden, OptiVol thoracic impedance suggested possible fluid accumulation starting 10/3 and becoming worse during and following hospital discharge on 10/23.  Following discharge he was restarted on his furosemide 40 mg daily with ICM clinic phone appointment on 05/03/2023 to recheck fluid levels.   He was seen in clinic on 04/27/23. He felt he was improving, noted that he felt significantly better than he did the week prior.  He  reported that he had stopped midodrine as he was noting intermittent headaches and difficulty with urination.  With lower extremity edema noted on exam and thoracic impedance noted possible fluid accumulation his Lasix was increased to 40 mg in the morning and afternoon for 4 days. Patient had noted improvement in fluid status on follow up however, on 06/07/2023 he notified the office of feeling increasingly short of breath with minimal exertion, tired with small amount of leg swelling and a 3 to 4 pound weight gain over the last couple of weeks.  His weight was 209 pounds, prior weight at office visit 204 pounds.  Patient reported he been taking furosemide 40 mg twice a day for 4 days with no relief of symptoms.  OptiVol suggested possible fluid accumulation since 11/15.  Patient completed Furoscix for one dose and reported improvement.   Today he presents for follow-up.  He reports that he is overall doing better since using the Furoscix. He reports he used furoscix three times with significant improvement. His lower extremity edema and breathing has improved and overall resolved. He denies chest pain. Notes that he has returned back to his dry weight of around 205-207lbs on his home scale.  He feels that he is back close to his baseline, he does note some ongoing fatigue that has been present since his colonoscopy. He continues to attend cardiac rehab, he plans to travel in January and February.   Labwork independently reviewed: 06/18/23: Sodium 142, potassium 4.4, creatinine 1.26  ROS: .   Today he denies chest pain, fatigue, palpitations, melena, hematuria, hemoptysis, diaphoresis, weakness, presyncope, syncope, orthopnea, and PND.  All other systems are reviewed and otherwise negative. Studies Reviewed: Marland Kitchen    EKG:  EKG is not ordered today.  CV Studies:  Cardiac Studies & Procedures   CARDIAC CATHETERIZATION  CARDIAC CATHETERIZATION 12/15/2021  Narrative 1.  Severe native coronary artery  disease with patency of the left main, total occlusion of the LAD just after the first diagonal, multiple severe calcific stenoses throughout the entirety of the circumflex distribution, and moderate stenoses in the RCA 2.  Status post CABG with continued patency of the LIMA to LAD, and interval occlusion of the SVG to PDA and SVG to PLA 3.  Normal LVEDP  Heart team review, consider medical therapy versus complex atherectomy and intervention of the left circumflex which would involve stenting back into the left main and covering a long segment throughout the circumflex in order to treat multiple severe calcific lesions.  Note that the patient had akinesis of the inferior and posterior walls on his echo and hypokinesis of the anterior wall segments.  He does not have angina.  Might be appropriate to perform a viability study.  Will discuss his case with Dr. [name].  Findings Coronary Findings Diagnostic  Dominance: Right  Left Main Ost LM to LM lesion is 30% stenosed. The lesion is discrete. The lesion is moderately calcified.  Left Anterior Descending Vessel is small. Ost LAD to Prox LAD lesion is 50% stenosed. Mid LAD lesion is 100% stenosed. The lesion is  severely calcified.  Left Circumflex Vessel is small. The entire circumflex vessel is severely calcified.  There is high-grade 95% stenosis at the ostium, severe eccentric 95% stenosis in the proximal vessel, severe 90% stenosis in the mid vessel, and severe 90% stenosis in the distal vessel just before it branches into 2 OM&#39;s Ost Cx lesion is 95% stenosed. The lesion is calcified. Prox Cx to Mid Cx lesion is 90% stenosed. The lesion is eccentric. The lesion is severely calcified. Mid Cx to Dist Cx lesion is 90% stenosed. The lesion is discrete.  Right Coronary Artery Prox RCA lesion is 60% stenosed. The lesion is type C and discrete. The lesion is severely calcified. Dist RCA lesion is 50% stenosed.  Right Posterior Descending  Artery Vessel is small in size.  Right Posterior Atrioventricular Artery Vessel is small in size. RPAV lesion is 100% stenosed.  Graft To RPDA And is moderate in size.  The graft exhibits minimal luminal irregularities. Origin to Prox Graft lesion is 100% stenosed.  Graft To RPAV And is small.  The graft exhibits no disease. Origin to Prox Graft lesion is 100% stenosed.  LIMA LIMA Graft To Dist LAD LIMA and is moderate in size.  The graft exhibits no disease. There is competitive flow. The LIMA to LAD graft is widely patent with no stenosis.  This fills the entirety of the LAD as it is occluded just beyond the first diagonal branch  Intervention  No interventions have been documented.   CARDIAC CATHETERIZATION  CARDIAC CATHETERIZATION 09/13/2015  Narrative  SVG was injected is moderate in size. numerous valves  The graft exhibits minimal luminal irregularities.  SVG was injected is small, and is anatomically normal.  Prox RCA lesion, 85% stenosed.  Prox LAD lesion, 100% stenosed.  Pedicle LIMA was injected is moderate in size, and is anatomically normal.  There is competitive flow.  Mid Cx to Dist Cx lesion, 30% stenosed.  Post Atrio lesion, 100% stenosed.  Ost LM to LM lesion, 30% stenosed.  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 Patent LIMA-LAD, SVG-PDA and SVG-PLB grafts with TIMI III flow and good distal runoff. LVEDP = 16 mmHg. Moderately reduced CO by Fick of 4.8 L/min and CI of 2 L/min, RA of 6 and PA mean of 29 mmHg. Compensated right and left heart pressures. LVEF 30-35% with global hypokinesis and inferior akinesis.  [name], MD, [facility] Attending Cardiologist [facility]  Findings Coronary Findings Diagnostic  Dominance: Right  Left Main Moderately Calcified discrete.  Left Anterior Descending . Vessel is small. Severely Calcified.  Left Circumflex . Vessel is small. Discrete.  Right  Coronary Artery The lesion is type C Discrete.  Right Posterior Descending Artery The vessel is small in size.  Right Posterior Atrioventricular Artery The vessel is small in size.  Single Graft Graft To RPDA SVG was injected is moderate in size. numerous valves  The graft exhibits minimal luminal irregularities.  Single Graft Graft To RPAV SVG was injected is small, and is anatomically normal.  LIMA LIMA Graft To Dist LAD LIMA was injected is moderate in size, and is anatomically normal. There is competitive flow.  Intervention  No interventions have been documented.   STRESS TESTS  NM MYOCAR MULTI W/SPECT W 10/20/2010  ECHOCARDIOGRAM  ECHOCARDIOGRAM COMPLETE 02/19/2023  Narrative ECHOCARDIOGRAM REPORT    Patient Name:   EMMIT [name] Date of Exam: 02/19/2023 Medical Rec #:  [id number]      Height:  74.0 in Accession #:    [id number]     Weight:       205.2 lb Date of Birth:  August 03, 1945     BSA:          2.197 m Patient Age:    10 years       BP:           110/59 mmHg Patient Gender: M              HR:           67 bpm. [facility]  Procedure: 2D Echo, Cardiac Doppler and Color Doppler  Indications:    Congestive Heart Failure I50.9  History:        Patient has prior history of Echocardiogram examinations, most recent 11/10/2022. Cardiomyopathy and CHF, CAD, Prior CABG and Defibrillator, Stroke, Arrythmias:Atrial Fibrillation; Risk Factors:Diabetes, Hypertension and Dyslipidemia.  Sonographer:    Celesta Gentile RCS Referring Phys: [phone] KENNETH C [name]  IMPRESSIONS   1. Left ventricular ejection fraction, by estimation, is 30 to 35%. The left ventricle has moderately decreased function. The left ventricle demonstrates regional wall motion abnormalities (see scoring diagram/findings for description). The left ventricular internal cavity size was moderately dilated. There is mild asymmetric left ventricular hypertrophy of the basal segment. Left  ventricular diastolic parameters are consistent with Grade III diastolic dysfunction (restrictive). 2. Right ventricular systolic function is mildly reduced. The right ventricular size is normal. There is severely elevated pulmonary artery systolic pressure. The estimated right ventricular systolic pressure is 60.1 mmHg. 3. Left atrial size was mildly dilated. 4. Right atrial size was mildly dilated. 5. The mitral valve is abnormal with restricted posterior leaflet. Mild mitral valve regurgitation. 6. Tricuspid valve regurgitation is mild to moderate. 7. The aortic valve is tricuspid. Aortic valve regurgitation is not visualized. 8. The inferior vena cava is dilated in size with >50% respiratory variability, suggesting right atrial pressure of 8 mmHg.  Comparison(s): Prior images reviewed side by side. LVEF 30-35% with wall motion abnormalities consistent with ischemic cardiomyopathy. Mild mitral regurgitation. Severely elevated estimated RVSP of 60 mmHg.  FINDINGS Left Ventricle: Left ventricular ejection fraction, by estimation, is 30 to 35%. The left ventricle has moderately decreased function. The left ventricle demonstrates regional wall motion abnormalities. The left ventricular internal cavity size was moderately dilated. There is mild asymmetric left ventricular hypertrophy of the basal segment. Left ventricular diastolic parameters are consistent with Grade III diastolic dysfunction (restrictive).   LV Wall Scoring: The antero-lateral wall and posterior wall are akinetic. The mid and distal anterior septum, inferior septum, apical lateral segment, and apex are hypokinetic. The basal anteroseptal segment is normal.  Right Ventricle: The right ventricular size is normal. No increase in right ventricular wall thickness. Right ventricular systolic function is mildly reduced. There is severely elevated pulmonary artery systolic pressure. The tricuspid regurgitant velocity is 3.61 m/s, and  with an assumed right atrial pressure of 8 mmHg, the estimated right ventricular systolic pressure is 60.1 mmHg.  Left Atrium: Left atrial size was mildly dilated.  Right Atrium: Right atrial size was mildly dilated.  Pericardium: There is no evidence of pericardial effusion.  Mitral Valve: The mitral valve is abnormal. Mild mitral valve regurgitation. MV peak gradient, 7.8 mmHg. The mean mitral valve gradient is 2.0 mmHg.  Tricuspid Valve: The tricuspid valve is grossly normal. Tricuspid valve regurgitation is mild to moderate.  Aortic Valve: The aortic valve is tricuspid. There is mild aortic valve annular calcification.  Aortic valve regurgitation is not visualized.  Pulmonic Valve: The pulmonic valve was grossly normal. Pulmonic valve regurgitation is trivial.  Aorta: The aortic root is normal in size and structure.  Venous: The inferior vena cava is dilated in size with greater than 50% respiratory variability, suggesting right atrial pressure of 8 mmHg.  IAS/Shunts: No atrial level shunt detected by color flow Doppler.  Additional Comments: A device lead is visualized.   LEFT VENTRICLE PLAX 2D LVIDd:         6.40 cm      Diastology LVIDs:         5.30 cm      LV e' medial:    3.06 cm/s LV PW:         0.80 cm      LV E/e' medial:  37.9 LV IVS:        1.20 cm      LV e' lateral:   5.12 cm/s LVOT diam:     2.00 cm      LV E/e' lateral: 22.7 LV SV:         57 LV SV Index:   26 LVOT Area:     3.14 cm  LV Volumes (MOD) LV vol d, MOD A2C: 163.0 ml LV vol d, MOD A4C: 167.0 ml LV vol s, MOD A2C: 101.0 ml LV vol s, MOD A4C: 111.0 ml LV SV MOD A2C:     62.0 ml LV SV MOD A4C:     167.0 ml LV SV MOD BP:      60.2 ml  RIGHT VENTRICLE RV S prime:     7.25 cm/s TAPSE (M-mode): 1.9 cm  LEFT ATRIUM              Index        RIGHT ATRIUM           Index LA diam:        4.70 cm  2.14 cm/m   RA Area:     24.60 cm LA Vol (A2C):   103.0 ml 46.88 ml/m  RA Volume:   75.10 ml  34.18  ml/m LA Vol (A4C):   56.4 ml  25.67 ml/m LA Biplane Vol: 78.1 ml  35.54 ml/m AORTIC VALVE LVOT Vmax:   75.70 cm/s LVOT Vmean:  48.500 cm/s LVOT VTI:    0.181 m  AORTA Ao Root diam: 3.30 cm  MITRAL VALVE                TRICUSPID VALVE MV Area (PHT): 4.31 cm     TR Peak grad:   52.1 mmHg MV Area VTI:   1.52 cm     TR Vmax:        361.00 cm/s MV Peak grad:  7.8 mmHg MV Mean grad:  2.0 mmHg     SHUNTS MV Vmax:       1.40 m/s     Systemic VTI:  0.18 m MV Vmean:      54.4 cm/s    Systemic Diam: 2.00 cm MV Decel Time: 176 msec MR Peak grad: 82.8 mmHg MR Mean grad: 49.0 mmHg MR Vmax:      455.00 cm/s MR Vmean:     318.0 cm/s MV E velocity: 116.00 cm/s MV A velocity: 41.10 cm/s MV E/A ratio:  2.82  Nona Dell MD Electronically signed by Nona Dell MD Signature Date/Time: 02/19/2023/10:45:43 AM    Final     CARDIAC MRI  MR CARDIAC MORPHOLOGY W WO CONTRAST  01/19/2022  Narrative CLINICAL DATA:  44M with CAD s/p CABG x3 (LIMa-LAD, SVG-PDA, SVG-PLA). Echo 12/05/21 showed EF 30-35%. Cath 12/15/21 showed patient LIMA-LAD, occluded SVG-PDA and SVG-PLA. Evaluate viability.  EXAM: CARDIAC MRI  TECHNIQUE: The patient was scanned on a 1.5 Tesla Siemens magnet. A dedicated cardiac coil was used. Functional imaging was done using Fiesta sequences. 2,3, and 4 chamber views were done to assess for RWMA's. Modified Simpson's rule using a short axis stack was used to calculate an ejection fraction on a dedicated work [employer]. The patient received 10 cc of Gadavist. After 10 minutes inversion recovery sequences were used to assess for infiltration and scar tissue.  CONTRAST:  10 cc  of Gadavist  FINDINGS: Left ventricle:  -Severe dilatation  -Severe systolic dysfunction. Thinning/akinesis of lateral and inferior walls. Lateral wall and apical inferior wall measures <4.54mm in thickness, suggesting nonviability.  -Elevated ECV (38%)  -No  LGE  LV EF: 28% (Normal 56-78%)  Absolute volumes:  LV EDV: (Normal 77-195 mL)  LV ESV: (Normal 19-72 mL)  LV SV: 95mL (Normal 51-133 mL)  CO: 6.0L/min (Normal 2.8-8.8 L/min)  Indexed volumes:  LV EDV: 133mL/sq-m (Normal 47-92 mL/sq-m)  LV ESV: 124mL/sq-m (Normal 13-30 mL/sq-m)  LV SV: 83mL/sq-m (Normal 32-62 mL/sq-m)  CI: 2.7L/min/sq-m (Normal 1.7-4.2 L/min/sq-m)  Right ventricle: Mild dilatation with mild systolic dysfunction  RV EF:  16% (Normal 47-74%)  Absolute volumes:  RV EDV: (Normal 88-227 mL)  RV ESV: (Normal 23-103 mL)  RV SV: 96mL (Normal 52-138 mL)  CO: 6.1L/min (Normal 2.8-8.8 L/min)  Indexed volumes:  RV EDV: 13mL/sq-m (Normal 55-105 mL/sq-m)  RV ESV: 1mL/sq-m (Normal 15-43 mL/sq-m)  RV SV: 41mL/sq-m (Normal 32-64 mL/sq-m)  CI: 2.7L/min/sq-m (Normal 1.7-4.2 L/min/sq-m)  Left atrium: Mild enlargement  Right atrium: Mild enlargement  Mitral valve: Moderate mitral regurgitation (regurgitant fraction 27%)  Aortic valve: No regurgitation  Tricuspid valve: Moderate tricuspid regurgitation (regurgitant fraction 25%)  Pulmonic valve: No regurgitation  Aorta: Normal proximal ascending aorta  Pericardium: Normal  IMPRESSION: 1. Severe LV dilatation with severe systolic dysfunction (EF 28%). Thinning/akinesis of inferior and lateral walls.  2.  Mild RV dilatation with mild systolic dysfunction (EF 40%)  3. While no late gadolinium enhancement is seen, there is severe thinning (less than 4.45mm) in the apical to basal lateral wall and apical inferior wall, suggesting nonviability in these regions.  4.  Moderate mitral regurgitation (regurgitant fraction 27%)  5.  Moderate tricuspid regurgitation (regurgitant fraction 25%)   Electronically Signed By: Epifanio Lesches M.D. On: 01/20/2022 23:31          Current Reported Medications:.    Current Meds  Medication Sig   acetaminophen (TYLENOL) 500 MG  tablet Take 500-1,000 mg by mouth every 6 (six) hours as needed (for pain.).   apixaban (ELIQUIS) 5 MG TABS tablet Take 1 tablet (5 mg total) by mouth 2 (two) times daily.   empagliflozin (JARDIANCE) 25 MG TABS tablet Take 1 tablet (25 mg total) by mouth daily.   Furosemide (FUROSCIX) 80 MG/10ML CTKT Apply 1 time   gabapentin (NEURONTIN) 300 MG capsule Take 600 mg by mouth at bedtime.   metFORMIN (GLUCOPHAGE) 1000 MG tablet Take 1,000 mg by mouth 2 times daily at 12 noon and 4 pm.   metFORMIN (GLUCOPHAGE-XR) 500 MG 24 hr tablet Take 1,000 mg by mouth 2 (two) times daily.   midodrine (PROAMATINE) 5 MG tablet Take 1 tablet (5 mg total) by mouth as needed.  For systolic blood pressure <95   ONETOUCH VERIO test strip 1 each daily.   polyethylene glycol (MIRALAX / GLYCOLAX) 17 g packet Take 17 g by mouth daily as needed for mild constipation.   rosuvastatin (CRESTOR) 10 MG tablet Take 1 tablet (10 mg total) by mouth every other day.   senna-docusate (SENOKOT-S) 8.6-50 MG tablet Take 1 tablet by mouth at bedtime as needed for mild constipation.   [DISCONTINUED] furosemide (LASIX) 40 MG tablet Take 1 tablet (40 mg total) by mouth daily. Take 1  tablet by mouth in the morning and 1 tablet by mouth in the afternoon as needed.   Physical Exam:    VS:  BP 112/60 (BP Location: Left Arm, Patient Position: Sitting, Cuff Size: Large)   Pulse 77   Ht 6\' 2"  (1.88 m)   Wt 206 lb (93.4 kg)   SpO2 93%   BMI 26.45 kg/m    Wt Readings from Last 3 Encounters:  06/21/23 206 lb (93.4 kg)  05/14/23 204 lb (92.5 kg)  05/13/23 209 lb 9.6 oz (95.1 kg)    GEN: Well nourished, well developed in no acute distress NECK: No JVD; No carotid bruits CARDIAC: RRR, no murmurs, rubs, gallops RESPIRATORY:  Clear to auscultation without rales, wheezing or rhonchi  ABDOMEN: Soft, non-tender, non-distended EXTREMITIES:  No edema; No acute deformity   Asessement and Plan:.    Chronic systolic heart failure: EF 28% by CT MRI  in 12/2021, CRT-D inserted in 2023.  Most recent echo on 02/19/2023 indicated LVEF of 30 to 35%, LV with moderately decreased function, LV demonstrating regional wall motion abnormalities, LV internal cavity was moderately dilated, grade 3 diastolic dysfunction.  RV systolic function was mildly reduced.  There is severely elevated pulmonary artery systolic pressure. On 10/24 device check showed 0% A-fib burden, OptiVol thoracic impedance suggested possible fluid accumulation starting 10/3 and becoming worse during and following hospital discharge on 10/23. Coreg was discontinued during admission for low blood pressure.  On 06/07/23 he noted increased shortness of breath, fatigue and lower extremity edema, he noted a 3 to 4 lb weight gain in the weeks prior. He was started on Furiosx and completed three injections with significant improvement. Today he reports he is doing well, his lower extremity edema and shortness of breath is overall improved. He notes he is back to his home dry weight between 205-207 lbs. This morning at home he was 206 lbs. He appears euvolemic and well compensated today on exam. Encouraged to continue monitoring daily weights. Encouraged to fluid and salt restriction. BMET on Friday was stable, unfortunately his BNP was unable to be analyzed, will repeat today. He will have device interrogation on 06/28/23 to reassess fluid status.  Unable to restart carvedilol given need for diuresis and Continue continue Jardiance 25 mg daily, and Lasix 40 mg twice daily, to take an additional 20 mg of lasix as needed for weight gain of 2 to 3 pounds overnight, 5 pounds in a week, increased lower extremity edema or shortness of breath.  Atrial fibrillation: Underwent DCCV in 04/2019 and 09/2022.  Following cardioversion in April 2024 his ICD transmission suggested he had an early return to A-fib after. Underwent amiodarone loading and DCCV on 12/23/2022. On 01/19/23 he underwent afib ablation with Dr. [name].  Most recent device interrogation showed 0% afib/flutter burden. Amiodarone discontinued in November, 2024. He denies palpitations or feeling of increased heart rate. Continue Eliquis 5 mg twice daily.   GI bleeding/Chronic anticoagulation: Patient had colonoscopy  on 04/17/23, admitted with BRBPR on 04/19/23. He received 1 unit PRBC and IV fluids in setting of hypotension. Underwent repeat colonoscopy with further clips placed. He denies any further bleeding. Restarted on Eliquis 5 mg twice daily, he denies any further bleeding problems.   CAD: s/p CABG in 2013. Last heart catheterization in 11/2021 indicated occluded SVG to PDA and SVG to PLA, he patent LIMA to LAD.  Recommended to treat medically. Stable with no anginal symptoms. No indication for ischemic evaluation.  No ASA given need for Eliquis.  Hypotension: Blood pressure today 112/60. Coreg was discontinued while inpatient in setting of orthostatic hypotension given blood loss.  If uptrending can consider restarting carvedilol. Will continue midodrine as needed for systolic blood pressure less than 95.  Hyperlipidemia: On rosuvastatin 10 mg every other day.  OSA on BiPAP: Patient reports nightly compliance.   Severe MR: Noted to be mild on echo on 02/19/2023.  Will continue monitoring with echoes repeat.    Disposition: F/u with Dr. [name] in two months or sooner if needed.   Signed, [name], NP

## 2023-06-17 ENCOUNTER — Ambulatory Visit: Payer: Medicare Other | Admitting: Gastroenterology

## 2023-06-17 ENCOUNTER — Encounter (HOSPITAL_COMMUNITY)
Admission: RE | Admit: 2023-06-17 | Discharge: 2023-06-17 | Disposition: A | Discharge status: Home / Self Care | Payer: Medicare Other | Source: Ambulatory Visit | Attending: Internal Medicine | Admitting: Internal Medicine

## 2023-06-17 DIAGNOSIS — I5022 Chronic systolic (congestive) heart failure: Secondary | ICD-10-CM

## 2023-06-17 DIAGNOSIS — Z95810 Presence of automatic (implantable) cardiac defibrillator: Secondary | ICD-10-CM | POA: Diagnosis not present

## 2023-06-17 NOTE — Progress Notes (Signed)
Daily Session Note  Patient Details  Name: [name] [name] MRN: [id number] Date of Birth: May 14, 1946 Referring Provider:   Flowsheet Row CARDIAC REHAB PHASE II ORIENTATION from 03/17/2023 in [facility] CARD[facility]AC REH[facility]ILITATION  Referring Provider Zoila Shutter MD       Encounter Date: 06/17/2023  Check In:  Session Check In - 06/17/23 1100       Check-In   Supervising physician immediately available to respond to emergencies See telemetry face sheet for immediately available MD    Location [facility]    Staff Present Ross Ludwig, BS, Exercise Physiologist;Debra Laural Benes, RN, BSN;Other   Sabino Dick, [place]   Virtual Visit No    Medication changes reported     No    Fall or balance concerns reported    No    Warm-up and Cool-down Performed on first and last piece of equipment    Resistance Training Performed Yes    VAD Patient? No    PAD/SET Patient? No      Pain Assessment   Currently in Pain? No/denies             Capillary Blood Glucose: No results found for this or any previous visit (from the past 24 hours).    Social History   Tobacco Use  Smoking Status Former   Types: Cigars   Quit date: 09/07/2011   Years since quitting: 11.7  Smokeless Tobacco Former   Quit date: 02/28/2012  Tobacco Comments   Former smoker 02/16/23    Goals Met:  Independence with exercise equipment Exercise tolerated well No report of concerns or symptoms today Strength training completed today  Goals Unmet:  Not Applicable  Comments: Pt able to follow exercise prescription today without complaint.  Will continue to monitor for progression.

## 2023-06-18 DIAGNOSIS — Z79899 Other long term (current) drug therapy: Secondary | ICD-10-CM | POA: Diagnosis not present

## 2023-06-18 DIAGNOSIS — R0602 Shortness of breath: Secondary | ICD-10-CM | POA: Diagnosis not present

## 2023-06-21 ENCOUNTER — Encounter: Payer: Self-pay | Admitting: Cardiology

## 2023-06-21 ENCOUNTER — Ambulatory Visit: Payer: Medicare Other | Attending: Cardiology | Admitting: Cardiology

## 2023-06-21 VITALS — BP 112/60 | HR 77 | Ht 74.0 in | Wt 206.0 lb

## 2023-06-21 DIAGNOSIS — R0602 Shortness of breath: Secondary | ICD-10-CM

## 2023-06-21 DIAGNOSIS — I255 Ischemic cardiomyopathy: Secondary | ICD-10-CM

## 2023-06-21 DIAGNOSIS — I4811 Longstanding persistent atrial fibrillation: Secondary | ICD-10-CM | POA: Diagnosis not present

## 2023-06-21 DIAGNOSIS — Z951 Presence of aortocoronary bypass graft: Secondary | ICD-10-CM | POA: Diagnosis not present

## 2023-06-21 DIAGNOSIS — D6869 Other thrombophilia: Secondary | ICD-10-CM | POA: Diagnosis not present

## 2023-06-21 DIAGNOSIS — I5022 Chronic systolic (congestive) heart failure: Secondary | ICD-10-CM | POA: Diagnosis not present

## 2023-06-21 DIAGNOSIS — Z95810 Presence of automatic (implantable) cardiac defibrillator: Secondary | ICD-10-CM | POA: Diagnosis not present

## 2023-06-21 DIAGNOSIS — I4819 Other persistent atrial fibrillation: Secondary | ICD-10-CM | POA: Diagnosis not present

## 2023-06-21 LAB — BASIC METABOLIC PANEL
BUN/Creatinine Ratio: 15 (ref 10–24)
BUN: 19 mg/dL (ref 8–27)
CO2: 27 mmol/L (ref 20–29)
Calcium: 9.1 mg/dL (ref 8.6–10.2)
Chloride: 99 mmol/L (ref 96–106)
Creatinine, Ser: 1.26 mg/dL (ref 0.76–1.27)
Glucose: 159 mg/dL — ABNORMAL HIGH (ref 70–99)
Potassium: 4.4 mmol/L (ref 3.5–5.2)
Sodium: 142 mmol/L (ref 134–144)
eGFR: 58 mL/min/{1.73_m2} — ABNORMAL LOW (ref >59)

## 2023-06-21 LAB — BRAIN NATRIURETIC PEPTIDE

## 2023-06-21 MED ORDER — FUROSEMIDE 40 MG PO TABS: 40.0000 mg | ORAL_TABLET | Freq: Two times a day (BID) | ORAL | 3 refills | Status: DC

## 2023-06-21 NOTE — Patient Instructions (Addendum)
Medication Instructions:  Increase Lasix to 40 mg twice a day and take an additional 20 mg once a day as needed for any sudden weight gain of 3 lbs overnight or 5 lbs over a week or any lower extremity swelling or shortness of breath. *If you need a refill on your cardiac medications before your next appointment, please call your pharmacy*   Lab Work: Today we will check BNP If you have labs (blood work) drawn today and your tests are completely normal, you will receive your results only by: MyChart Message (if you have MyChart) OR A paper copy in the mail If you have any lab test that is abnormal or we need to change your treatment, we will call you to review the results.   Testing/Procedures: No testing  Follow-Up: At [facility], you and your health needs are our priority.  As part of our continuing mission to provide you with exceptional heart care, we have created designated Provider Care Teams.  These Care Teams include your primary Cardiologist (physician) and Advanced Practice Providers (APPs -  Physician Assistants and Nurse Practitioners) who all work together to provide you with the care you need, when you need it.  We recommend signing up for the patient portal called "MyChart".  Sign up information is provided on this After Visit Summary.  MyChart is used to connect with patients for Virtual Visits (Telemedicine).  Patients are able to view lab/test results, encounter notes, upcoming appointments, etc.  Non-urgent messages can be sent to your provider as well.   To learn more about what you can do with MyChart, go to [email or web address].    Your next appointment:   March  Provider:   [name], MD    Other Instructions

## 2023-06-22 ENCOUNTER — Encounter (HOSPITAL_COMMUNITY): Payer: Medicare Other

## 2023-06-22 LAB — BRAIN NATRIURETIC PEPTIDE: BNP: 428.5 pg/mL — ABNORMAL HIGH (ref 0.0–100.0)

## 2023-06-24 ENCOUNTER — Telehealth: Payer: Self-pay

## 2023-06-24 ENCOUNTER — Encounter: Payer: Self-pay | Admitting: Internal Medicine

## 2023-06-24 ENCOUNTER — Encounter (HOSPITAL_COMMUNITY)
Admission: RE | Admit: 2023-06-24 | Discharge: 2023-06-24 | Disposition: A | Discharge status: Home / Self Care | Payer: Medicare Other | Source: Ambulatory Visit | Attending: Internal Medicine | Admitting: Internal Medicine

## 2023-06-24 DIAGNOSIS — Z6826 Body mass index (BMI) 26.0-26.9, adult: Secondary | ICD-10-CM | POA: Diagnosis not present

## 2023-06-24 DIAGNOSIS — R030 Elevated blood-pressure reading, without diagnosis of hypertension: Secondary | ICD-10-CM | POA: Diagnosis not present

## 2023-06-24 DIAGNOSIS — D509 Iron deficiency anemia, unspecified: Secondary | ICD-10-CM | POA: Diagnosis not present

## 2023-06-24 DIAGNOSIS — E1165 Type 2 diabetes mellitus with hyperglycemia: Secondary | ICD-10-CM | POA: Diagnosis not present

## 2023-06-24 DIAGNOSIS — R55 Syncope and collapse: Secondary | ICD-10-CM | POA: Diagnosis not present

## 2023-06-24 NOTE — Telephone Encounter (Signed)
-----   Message from [name] sent at 06/24/2023  1:16 PM EST ----- Please let [name] [name] know that his BNP is close to 6 weeks ago when his volume status was around his normal. Would recommend continuing with increased dose of lasix as discussed at office visit. His device will be interrogated next week which will also offer more information.

## 2023-06-24 NOTE — Telephone Encounter (Signed)
Called patient advised of below he verbalized understanding, no questions .

## 2023-06-28 ENCOUNTER — Encounter (INDEPENDENT_AMBULATORY_CARE_PROVIDER_SITE_OTHER): Payer: Medicare Other

## 2023-06-28 DIAGNOSIS — Z95810 Presence of automatic (implantable) cardiac defibrillator: Secondary | ICD-10-CM

## 2023-06-28 DIAGNOSIS — I5022 Chronic systolic (congestive) heart failure: Secondary | ICD-10-CM | POA: Diagnosis not present

## 2023-06-29 ENCOUNTER — Encounter (HOSPITAL_COMMUNITY)
Admission: RE | Admit: 2023-06-29 | Discharge: 2023-06-29 | Disposition: A | Discharge status: Home / Self Care | Payer: Medicare Other | Source: Ambulatory Visit | Attending: Internal Medicine

## 2023-06-29 VITALS — Ht 72.5 in | Wt 209.9 lb

## 2023-06-29 DIAGNOSIS — I5022 Chronic systolic (congestive) heart failure: Secondary | ICD-10-CM | POA: Diagnosis not present

## 2023-06-29 DIAGNOSIS — Z95810 Presence of automatic (implantable) cardiac defibrillator: Secondary | ICD-10-CM | POA: Diagnosis not present

## 2023-06-29 NOTE — Progress Notes (Signed)
 Daily Session Note  Patient Details  Name: [name] [name] MRN: [id number] Date of Birth: [birth year] Referring Provider:   Flowsheet Row CARDIAC REHAB PHASE II ORIENTATION from 03/17/2023 in [facility] C[facility]AC RE[facility]  Referring Provider Mona Kent MD       Encounter Date: 06/29/2023  Check In:  Session Check In - 06/29/23 1043       Check-In   Supervising physician immediately available to respond to emergencies See telemetry face sheet for immediately available MD    Location [facility]    Staff Present Powell Benders, BS, Exercise Physiologist;Krysten Veronica Vonzell, MA, RCEP, CCRP, Lucienne Rohrer, RN, BSN    Virtual Visit No    Medication changes reported     No    Fall or balance concerns reported    No    Warm-up and Cool-down Performed on first and last piece of equipment    Resistance Training Performed Yes    VAD Patient? No    PAD/SET Patient? No      Pain Assessment   Currently in Pain? No/denies             Capillary Blood Glucose: No results found for this or any previous visit (from the past 24 hours).    Social History   Tobacco Use  Smoking Status Former   Types: Cigars   Quit date: 09/07/2011   Years since quitting: 11.8  Smokeless Tobacco Former   Quit date: 02/28/2012  Tobacco Comments   Former smoker 02/16/23    Goals Met:  Independence with exercise equipment Exercise tolerated well No report of concerns or symptoms today Strength training completed today  Goals Unmet:  Not Applicable  Comments: Pt able to follow exercise prescription today without complaint.  Will continue to monitor for progression. [name] [name] is headed to [place]  until March and today will be his last visit with us .   [name] [name] graduated today from  rehab with 29 sessions completed.  Details of the patient's exercise prescription and what He needs to do in order to continue the prescription and progress were discussed with patient.  Patient was  given a copy of prescription and goals.  Patient verbalized understanding. Bartholomew plans to continue to exercise by working out at facility in [place] .

## 2023-06-29 NOTE — Patient Instructions (Signed)
 Discharge Patient Instructions  Patient Details  Name: [name] [name] MRN: [id number] Date of Birth: 07-27-1945 Referring Provider:  Atilano Deward ORN, MD   Number of Visits: 28   Reason for Discharge:  Early Exit:  Personal  Diagnosis:  Heart failure, chronic systolic [facility])  Initial Exercise Prescription:  Initial Exercise Prescription - 03/17/23 1500       Date of Initial Exercise RX and Referring Provider   Date 03/17/23    Referring Provider Mona Kent MD      Oxygen    Maintain Oxygen  Saturation 88% or higher      NuStep   Level 1    SPM 80    Minutes 15    METs 2      REL-XR   Level 1    Speed 50    Minutes 15    METs 2      Prescription Details   Frequency (times per week) 2    Duration Progress to 30 minutes of continuous aerobic without signs/symptoms of physical distress      Intensity   THRR 40-80% of Max Heartrate 87-124    Ratings of Perceived Exertion 11-13    Perceived Dyspnea 0-4      Progression   Progression Continue to progress workloads to maintain intensity without signs/symptoms of physical distress.      Resistance Training   Training Prescription Yes    Weight 4 lb    Reps 10-15             Discharge Exercise Prescription (Final Exercise Prescription Changes):  Exercise Prescription Changes - 06/17/23 1200       Response to Exercise   Blood Pressure (Admit) 110/64    Blood Pressure (Exit) 102/50    Heart Rate (Admit) 80 bpm    Heart Rate (Exercise) 99 bpm    Heart Rate (Exit) 73 bpm    Rating of Perceived Exertion (Exercise) 13    Duration Continue with 30 min of aerobic exercise without signs/symptoms of physical distress.    Intensity THRR unchanged      Progression   Progression Continue to progress workloads to maintain intensity without signs/symptoms of physical distress.      Resistance Training   Training Prescription Yes    Weight 4 lbs    Reps 10-15      NuStep   Level 3    SPM 97    Minutes 15     METs 2.4      REL-XR   Level 4    Speed 43    Minutes 15    METs 2.6      Oxygen    Maintain Oxygen  Saturation 88% or higher             Functional Capacity:  6 Minute Walk     Row Name 03/17/23 1545 06/29/23 1047       6 Minute Walk   Phase Initial Discharge    Distance 733 feet 490 feet    Walk Time 5.03 minutes 5.1 minutes    # of Rest Breaks 2  21 sec, 36 sec 1    MPH 1.66 0.93    METS 1.41 0.83    RPE 14 14    Perceived Dyspnea  3 1    VO2 Peak 4.93 2.91    Symptoms Yes (comment) Yes (comment)    Comments SOB, walking with limp from leg pain poor circulation, fatigue across shoulders Standing break due to LLE weakness from  poor circulation    Resting HR 51 bpm 75 bpm    Resting BP 100/50 110/60    Resting Oxygen  Saturation  97 % 98 %    Exercise Oxygen  Saturation  during 6 min walk 98 % 96 %    Max Ex. HR 87 bpm 85 bpm    Max Ex. BP 126/74 120/60    2 Minute Post BP 114/56 116/60            Nutrition & Weight - Outcomes:  Pre Biometrics - 03/17/23 1551       Pre Biometrics   Height 6' 0.5 (1.842 m)    Weight 93.1 kg    Waist Circumference 40 inches    Hip Circumference 41.5 inches    Waist to Hip Ratio 0.96 %    BMI (Calculated) 27.43    Grip Strength 17.9 kg    Single Leg Stand 1.8 seconds             Post Biometrics - 06/29/23 1049        Post  Biometrics   Height 6' 0.5 (1.842 m)    Weight 95.2 kg    Waist Circumference 42 inches    Hip Circumference 42 inches    Waist to Hip Ratio 1 %    BMI (Calculated) 28.06    Grip Strength 20.6 kg    Single Leg Stand 0 seconds             Goals reviewed with patient; copy given to patient.

## 2023-06-29 NOTE — Progress Notes (Signed)
 Cardiac Individual Treatment Plan  Patient Details  Name: [name] [name] MRN: [id number] Date of Birth: Dec 09, 1945 Referring Provider:   Flowsheet Row CARDIAC REHAB PHASE II ORIENTATION from 03/17/2023 in [facility] CARDIAC REHABILITATION  Referring Provider Mona Kent MD       Initial Encounter Date:  Flowsheet Row CARDIAC REHAB PHASE II ORIENTATION from 03/17/2023 in [facility] CARDIAC REHABILITATION  Date 03/17/23       Visit Diagnosis: Heart failure, chronic systolic (HCC)  Patient's Home Medications on Admission:  Current Outpatient Medications:    acetaminophen  (TYLENOL ) 500 MG tablet, Take 500-1,000 mg by mouth every 6 (six) hours as needed (for pain.)., Disp: , Rfl:    apixaban  (ELIQUIS ) 5 MG TABS tablet, Take 1 tablet (5 mg total) by mouth 2 (two) times daily., Disp: , Rfl:    empagliflozin  (JARDIANCE ) 25 MG TABS tablet, Take 1 tablet (25 mg total) by mouth daily., Disp: 90 tablet, Rfl: 3   Furosemide  (FUROSCIX ) 80 MG/10ML CTKT, Apply 1 time, Disp: , Rfl:    furosemide  (LASIX ) 40 MG tablet, Take 1 tablet (40 mg total) by mouth 2 (two) times daily. Take an additional 20 mg once a day as needed for any sudden weight gain of 3 lbs overnight or 5 lbs over a week or any lower extremity swelling or shortness of breath., Disp: 90 tablet, Rfl: 3   gabapentin  (NEURONTIN ) 300 MG capsule, Take 600 mg by mouth at bedtime., Disp: , Rfl:    metFORMIN  (GLUCOPHAGE ) 1000 MG tablet, Take 1,000 mg by mouth 2 times daily at 12 noon and 4 pm., Disp: , Rfl:    metFORMIN  (GLUCOPHAGE -XR) 500 MG 24 hr tablet, Take 1,000 mg by mouth 2 (two) times daily., Disp: , Rfl:    midodrine  (PROAMATINE ) 5 MG tablet, Take 1 tablet (5 mg total) by mouth as needed. For systolic blood pressure <95, Disp: 30 tablet, Rfl: 2   ONETOUCH VERIO test strip, 1 each daily., Disp: , Rfl:    polyethylene glycol (MIRALAX  / GLYCOLAX ) 17 g packet, Take 17 g by mouth daily as needed for mild constipation., Disp: , Rfl:     rosuvastatin  (CRESTOR ) 10 MG tablet, Take 1 tablet (10 mg total) by mouth every other day., Disp: 50 tablet, Rfl: 3   senna-docusate (SENOKOT-S) 8.6-50 MG tablet, Take 1 tablet by mouth at bedtime as needed for mild constipation., Disp: , Rfl:   Past Medical History: Past Medical History:  Diagnosis Date   AICD (automatic cardioverter/defibrillator) present    Arthritis    Knee L - probably   Atrial fibrillation (HCC)    BPH (benign prostatic hyperplasia)    CAD (coronary artery disease) 07/06/2011   CHF (congestive heart failure) (HCC)    Coronary artery disease    Diabetes mellitus    Frequency of urination    Heart failure (HCC)    High cholesterol    History of nuclear stress test 09/2010   dipyridamole; mild perfusion defect due to attenuation with mild superimposed ischemia in apical septal, apical, apical inferior & apical lateral regions; rest LV enlarged in size; prominent gut uptake in infero-apical region; no significant ischemia demonstrated; low risk scan    HNP (herniated nucleus pulposus), lumbar    Hypertension    Ischemic cardiomyopathy    LV dysfunction 07/06/2011   Nocturia    Right bundle branch block    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 WENDI Fellers   Sleep apnea    sleep study 10/2010- AHI during total sleep 32.1/hr and during REM 62.3/hr (severe sleep apnea)unable to tolerate c pap   Spinal stenosis of lumbar region    Stroke (HCC)    [place]     Tobacco Use: Social History   Tobacco Use  Smoking Status Former   Types: Cigars   Quit date: 09/07/2011   Years since quitting: 11.8  Smokeless Tobacco Former   Quit date: 02/28/2012  Tobacco Comments   Former smoker 02/16/23    Labs: Review Flowsheet  More data exists      Latest Ref Rng & Units 10/17/2020 11/14/2020 01/02/2021 08/14/2021 12/10/2021  Labs for ITP Cardiac and Pulmonary Rehab  Cholestrol 100 - 199  mg/dL 818  - - 827  98   LDL (calc) 0 - 99 mg/dL 872  - - 881  46   HDL-C >39 mg/dL 36  - - 38  35   Trlycerides 0 - 149 mg/dL 97  - - 86  88   Hemoglobin A1c 4.8 - 5.6 % 7.0  - - - -  TCO2 22 - 32 mmol/L - 26  25  - -    Capillary Blood Glucose: Lab Results  Component Value Date   GLUCAP 197 (H) 04/21/2023   GLUCAP 163 (H) 04/21/2023   GLUCAP 156 (H) 04/21/2023   GLUCAP 145 (H) 04/21/2023   GLUCAP 166 (H) 04/20/2023     Exercise Target Goals: Exercise Program Goal: Individual exercise prescription set using results from initial 6 min walk test and THRR while considering  patient's activity barriers and safety.   Exercise Prescription Goal: Starting with aerobic activity 30 plus minutes a day, 3 days per week for initial exercise prescription. Provide home exercise prescription and guidelines that participant acknowledges understanding prior to discharge.  Activity Barriers & Risk Stratification:  Activity Barriers & Cardiac Risk Stratification - 03/17/23 1546       Activity Barriers & Cardiac Risk Stratification   Activity Barriers History of Falls;Balance Concerns;Arthritis;Muscular Weakness;Deconditioning;Shortness of Breath;Decreased Ventricular Function    Cardiac Risk Stratification High             6 Minute Walk:  6 Minute Walk     Row Name 03/17/23 1545 06/29/23 1047       6 Minute Walk   Phase Initial Discharge    Distance 733 feet 490 feet    Walk Time 5.03 minutes 5.1 minutes    # of Rest Breaks 2  21 sec, 36 sec 1    MPH 1.66 0.93    METS 1.41 0.83    RPE 14 14    Perceived Dyspnea  3 1    VO2 Peak 4.93 2.91    Symptoms Yes (comment) Yes (comment)    Comments SOB, walking with limp from leg pain poor circulation, fatigue across shoulders Standing break due to LLE weakness from poor circulation    Resting HR 51 bpm 75 bpm    Resting BP 100/50 110/60    Resting Oxygen  Saturation  97 % 98 %    Exercise Oxygen  Saturation  during 6 min walk 98 %  96 %    Max Ex. HR 87 bpm 85 bpm    Max Ex. BP 126/74 120/60    2 Minute Post BP 114/56 116/60             Oxygen  Initial Assessment:   Oxygen  Re-Evaluation:   Oxygen  Discharge (  Final Oxygen  Re-Evaluation):   Initial Exercise Prescription:  Initial Exercise Prescription - 03/17/23 1500       Date of Initial Exercise RX and Referring Provider   Date 03/17/23    Referring Provider Mona Kent MD      Oxygen    Maintain Oxygen  Saturation 88% or higher      NuStep   Level 1    SPM 80    Minutes 15    METs 2      REL-XR   Level 1    Speed 50    Minutes 15    METs 2      Prescription Details   Frequency (times per week) 2    Duration Progress to 30 minutes of continuous aerobic without signs/symptoms of physical distress      Intensity   THRR 40-80% of Max Heartrate 87-124    Ratings of Perceived Exertion 11-13    Perceived Dyspnea 0-4      Progression   Progression Continue to progress workloads to maintain intensity without signs/symptoms of physical distress.      Resistance Training   Training Prescription Yes    Weight 4 lb    Reps 10-15             Perform Capillary Blood Glucose checks as needed.  Exercise Prescription Changes:   Exercise Prescription Changes     Row Name 03/17/23 1500 03/30/23 1200 04/27/23 1500 06/10/23 1300 06/17/23 1200     Response to Exercise   Blood Pressure (Admit) 100/50 100/50 120/60 120/60 110/64   Blood Pressure (Exercise) 126/74 98/56 -- -- --   Blood Pressure (Exit) 114/56 100/56 118/62 108/50 102/50   Heart Rate (Admit) 51 bpm 59 bpm 75 bpm 70 bpm 80 bpm   Heart Rate (Exercise) 87 bpm 109 bpm 93 bpm 100 bpm 99 bpm   Heart Rate (Exit) 57 bpm 58 bpm 82 bpm 68 bpm 73 bpm   Oxygen  Saturation (Admit) 97 % -- -- -- --   Oxygen  Saturation (Exercise) 98 % -- -- -- --   Rating of Perceived Exertion (Exercise) 14 12 12 12 13    Perceived Dyspnea (Exercise) 3 -- -- -- --   Symptoms SOB, walking with limp from  poor circulation, fatigue across shoulders -- -- -- --   Comments walk test results -- -- -- --   Duration -- Continue with 30 min of aerobic exercise without signs/symptoms of physical distress. Continue with 30 min of aerobic exercise without signs/symptoms of physical distress. Continue with 30 min of aerobic exercise without signs/symptoms of physical distress. Continue with 30 min of aerobic exercise without signs/symptoms of physical distress.   Intensity -- THRR unchanged THRR unchanged THRR unchanged THRR unchanged     Progression   Progression -- Continue to progress workloads to maintain intensity without signs/symptoms of physical distress. Continue to progress workloads to maintain intensity without signs/symptoms of physical distress. Continue to progress workloads to maintain intensity without signs/symptoms of physical distress. Continue to progress workloads to maintain intensity without signs/symptoms of physical distress.     Resistance Training   Training Prescription -- Yes Yes Yes Yes   Weight -- 4 lbs 4lbs 4 lbs 4 lbs   Reps -- 10-15 10-15 10-15 10-15     NuStep   Level -- 2 2 2 3    SPM -- 92 91 84 97   Minutes -- 15 15 15 15    METs -- 2.2 2.2 2.1 2.4  REL-XR   Level -- 2 2 3 4    Speed -- 48 45 43 43   Minutes -- 15 15 15 15    METs -- 2.2 2 2  2.6     Oxygen    Maintain Oxygen  Saturation -- 88% or higher 88% or higher 88% or higher 88% or higher            Exercise Comments:   Exercise Goals and Review:   Exercise Goals     Row Name 03/17/23 1550             Exercise Goals   Increase Physical Activity Yes       Intervention Provide advice, education, support and counseling about physical activity/exercise needs.;Develop an individualized exercise prescription for aerobic and resistive training based on initial evaluation findings, risk stratification, comorbidities and participant's personal goals.       Expected Outcomes Short Term: Attend  rehab on a regular basis to increase amount of physical activity.;Long Term: Add in home exercise to make exercise part of routine and to increase amount of physical activity.;Long Term: Exercising regularly at least 3-5 days a week.       Increase Strength and Stamina Yes       Intervention Provide advice, education, support and counseling about physical activity/exercise needs.;Develop an individualized exercise prescription for aerobic and resistive training based on initial evaluation findings, risk stratification, comorbidities and participant's personal goals.       Expected Outcomes Short Term: Increase workloads from initial exercise prescription for resistance, speed, and METs.;Short Term: Perform resistance training exercises routinely during rehab and add in resistance training at home;Long Term: Improve cardiorespiratory fitness, muscular endurance and strength as measured by increased METs and functional capacity ( )       Able to understand and use rate of perceived exertion (RPE) scale Yes       Intervention Provide education and explanation on how to use RPE scale       Expected Outcomes Short Term: Able to use RPE daily in rehab to express subjective intensity level;Long Term:  Able to use RPE to guide intensity level when exercising independently       Able to understand and use Dyspnea scale Yes       Intervention Provide education and explanation on how to use Dyspnea scale       Expected Outcomes Short Term: Able to use Dyspnea scale daily in rehab to express subjective sense of shortness of breath during exertion;Long Term: Able to use Dyspnea scale to guide intensity level when exercising independently       Knowledge and understanding of Target Heart Rate Range (THRR) Yes       Intervention Provide education and explanation of THRR including how the numbers were predicted and where they are located for reference       Expected Outcomes Short Term: Able to state/look up  THRR;Long Term: Able to use THRR to govern intensity when exercising independently;Short Term: Able to use daily as guideline for intensity in rehab       Able to check pulse independently Yes       Intervention Provide education and demonstration on how to check pulse in carotid and radial arteries.;Review the importance of being able to check your own pulse for safety during independent exercise       Expected Outcomes Short Term: Able to explain why pulse checking is important during independent exercise;Long Term: Able to check pulse independently and accurately  Understanding of Exercise Prescription Yes       Intervention Provide education, explanation, and written materials on patient's individual exercise prescription       Expected Outcomes Short Term: Able to explain program exercise prescription;Long Term: Able to explain home exercise prescription to exercise independently                Exercise Goals Re-Evaluation :  Exercise Goals Re-Evaluation     Row Name 03/30/23 1259 04/27/23 1118 05/19/23 1538         Exercise Goal Re-Evaluation   Exercise Goals Review Increase Physical Activity;Increase Strength and Stamina;Understanding of Exercise Prescription Increase Physical Activity;Increase Strength and Stamina;Understanding of Exercise Prescription Increase Physical Activity;Increase Strength and Stamina;Understanding of Exercise Prescription     Comments Jaquez has been tolerating exericse well. He is on his 4th session and has already increased his levels on bith the NuStep and XR to level 2. Will continue to monitor and progress as able. Antolin is doing well in rehab.  He was out sick last week.  He is starting to feel better overall and this strength and stamina are starting to improve.  He is walking some at home. Rex has been doing well in rehab.  He is feeling better overall and feels like his stamina is starting to improve again.  When he is not in rehab, he is doing  some stretching at home.  He was encouraged to add in more walking to continue to build up his stamina.     Expected Outcomes Short term: continue to increase SPM and RPM on worklaods   long term: continue to attend rehab Short: Review home exercise guidelines Long: continue to improve stamina short: Add in more walking at home Long: contineu to improve stamina               Discharge Exercise Prescription (Final Exercise Prescription Changes):  Exercise Prescription Changes - 06/17/23 1200       Response to Exercise   Blood Pressure (Admit) 110/64    Blood Pressure (Exit) 102/50    Heart Rate (Admit) 80 bpm    Heart Rate (Exercise) 99 bpm    Heart Rate (Exit) 73 bpm    Rating of Perceived Exertion (Exercise) 13    Duration Continue with 30 min of aerobic exercise without signs/symptoms of physical distress.    Intensity THRR unchanged      Progression   Progression Continue to progress workloads to maintain intensity without signs/symptoms of physical distress.      Resistance Training   Training Prescription Yes    Weight 4 lbs    Reps 10-15      NuStep   Level 3    SPM 97    Minutes 15    METs 2.4      REL-XR   Level 4    Speed 43    Minutes 15    METs 2.6      Oxygen    Maintain Oxygen  Saturation 88% or higher             Nutrition:  Target Goals: Understanding of nutrition guidelines, daily intake of sodium 1500mg , cholesterol 200mg , calories 30% from fat and 7% or less from saturated fats, daily to have 5 or more servings of fruits and vegetables.  Biometrics:  Pre Biometrics - 03/17/23 1551       Pre Biometrics   Height 6' 0.5 (1.842 m)    Weight 93.1 kg    Waist Circumference  40 inches    Hip Circumference 41.5 inches    Waist to Hip Ratio 0.96 %    BMI (Calculated) 27.43    Grip Strength 17.9 kg    Single Leg Stand 1.8 seconds             Post Biometrics - 06/29/23 1049        Post  Biometrics   Height 6' 0.5 (1.842 m)     Weight 95.2 kg    Waist Circumference 42 inches    Hip Circumference 42 inches    Waist to Hip Ratio 1 %    BMI (Calculated) 28.06    Grip Strength 20.6 kg    Single Leg Stand 0 seconds             Nutrition Therapy Plan and Nutrition Goals:   Nutrition Assessments:  MEDIFICTS Score Key: >=70 Need to make dietary changes  40-70 Heart Healthy Diet <= 40 Therapeutic Level Cholesterol Diet  Flowsheet Row CARDIAC VIRTUAL BASED CARE from 03/12/2023 in [facility] CARDIAC REHABILITATION  Picture Your Plate Total Score on Admission 31      Picture Your Plate Scores: <59 Unhealthy dietary pattern with much room for improvement. 41-50 Dietary pattern unlikely to meet recommendations for good health and room for improvement. 51-60 More healthful dietary pattern, with some room for improvement.  >60 Healthy dietary pattern, although there may be some specific behaviors that could be improved.    Nutrition Goals Re-Evaluation:  Nutrition Goals Re-Evaluation     Row Name 04/28/23 1121 05/19/23 1540           Goals   Nutrition Goal Heart Healthy Diet Short: Continue to work toward heart healthy diet changes Long: continue to watch sodium      Comment Claus is doing well in rehab.  He has started to work on his diet.  But he didn't have an appetite until recently. He is trying to be good about watching sodium and sugars.  He has gotten good at checking labels for sodium levels. Ariana is doing well in rehab. He has been wroking on his diet.  He is watching sodium and trying to get in more fruits and vegetables.  He will continue to work on protion control and adding in variety.      Expected Outcome Short: Continue to work toward heart healthy diet changes Long: continue to watch sodium Short: Conitnue to add in more variety Long; Conitnue to eat heart healthy               Nutrition Goals Discharge (Final Nutrition Goals Re-Evaluation):  Nutrition Goals Re-Evaluation - 05/19/23  1540       Goals   Nutrition Goal Short: Continue to work toward heart healthy diet changes Long: continue to watch sodium    Comment Daruis is doing well in rehab. He has been wroking on his diet.  He is watching sodium and trying to get in more fruits and vegetables.  He will continue to work on protion control and adding in variety.    Expected Outcome Short: Conitnue to add in more variety Long; Conitnue to eat heart healthy             Psychosocial: Target Goals: Acknowledge presence or absence of significant depression and/or stress, maximize coping skills, provide positive support system. Participant is able to verbalize types and ability to use techniques and skills needed for reducing stress and depression.  Initial Review & Psychosocial Screening:  Initial  Psych Review & Screening - 03/12/23 1034       Initial Review   Current issues with None Identified      Family Dynamics   Good Support System? Yes      Barriers   Psychosocial barriers to participate in program There are no identifiable barriers or psychosocial needs.;The patient should benefit from training in stress management and relaxation.      Screening Interventions   Interventions Encouraged to exercise;To provide support and resources with identified psychosocial needs;Provide feedback about the scores to participant    Expected Outcomes Short Term goal: Utilizing psychosocial counselor, staff and physician to assist with identification of specific Stressors or current issues interfering with healing process. Setting desired goal for each stressor or current issue identified.;Long Term Goal: Stressors or current issues are controlled or eliminated.;Short Term goal: Identification and review with participant of any Quality of Life or Depression concerns found by scoring the questionnaire.;Long Term goal: The participant improves quality of Life and PHQ9 Scores as seen by post scores and/or verbalization of changes              Quality of Life Scores:  Quality of Life - 06/29/23 1113       Quality of Life   Select Quality of Life      Quality of Life Scores   Health/Function Pre 19.83 %    Health/Function Post 14.82 %    Health/Function % Change -25.26 %    Socioeconomic Pre 24.38 %    Socioeconomic Post 30 %    Socioeconomic % Change  23.05 %    Psych/Spiritual Pre 18.86 %    Psych/Spiritual Post 30 %    Psych/Spiritual % Change 59.07 %    Family Pre 27.6 %    Family Post 30 %    Family % Change 8.7 %    GLOBAL Pre 21.79 %    GLOBAL Post 23.15 %    GLOBAL % Change 6.24 %            Scores of 19 and below usually indicate a poorer quality of life in these areas.  A difference of  2-3 points is a clinically meaningful difference.  A difference of 2-3 points in the total score of the Quality of Life Index has been associated with significant improvement in overall quality of life, self-image, physical symptoms, and general health in studies assessing change in quality of life.  PHQ-9: Review Flowsheet  More data may exist      06/29/2023 03/17/2023 05/01/2021 02/13/2021 12/11/2020  Depression screen PHQ 2/9  Decreased Interest 1 0 0 0 0  Down, Depressed, Hopeless 0 0 0 0 0  PHQ - 2 Score 1 0 0 0 0  Altered sleeping 1 1 - - -  Tired, decreased energy 1 1 - - -  Change in appetite 0 1 - - -  Feeling bad or failure about yourself  0 0 - - -  Trouble concentrating 0 0 - - -  Moving slowly or fidgety/restless 0 0 - - -  Suicidal thoughts 0 0 - - -  PHQ-9 Score 3 3 - - -  Difficult doing work/chores Somewhat difficult Somewhat difficult - - -   Interpretation of Total Score  Total Score Depression Severity:  1-4 = Minimal depression, 5-9 = Mild depression, 10-14 = Moderate depression, 15-19 = Moderately severe depression, 20-27 = Severe depression   Psychosocial Evaluation and Intervention:  Psychosocial Evaluation - 03/12/23 1038  Psychosocial Evaluation & Interventions    Interventions Stress management education;Relaxation education;Encouraged to exercise with the program and follow exercise prescription    Comments Patient referred to CR with chronic systolic HF. He had CABG in 2013 and an ICD placed in 2023. He had an ablation to treat A-Fib/A-Flutter 7/23. He denies any depression, anxiety or stressors in his life. He says he sleeps well with a BIPAP. He has been retired for several years. He worked as a buyer, retail. He lives alone but says he has great support from his neighbors and friends. He has a daughter that lives out of town but does check on him a lot. He is currently doing outpatient PT working on his balance and gait and strengthening. He plans to continue doing PT while participating in CR. He had 2 falls in May falling backwards hitting his head causing subdural hematoma and was treated in the ED both times. He says he has a cane and walker but does not use them. He enjoys playing golf but is not playing as much as he would like to. His goals for the program are to get stronger; improve his balance; and be able to do more activities without getting tired. He has no barriers identified to complete CR.    Expected Outcomes Short Term: Start the program and attend consistently. Long Term: meet his personal goals.    Continue Psychosocial Services  Follow up required by staff             Psychosocial Re-Evaluation:  Psychosocial Re-Evaluation     Row Name 04/28/23 1120 05/19/23 1539           Psychosocial Re-Evaluation   Current issues with Current Stress Concerns Current Stress Concerns      Comments Weslie was out sick last week after having a bleeding issue with his routine colonoscopy.  He ended up spending a few days in the hospital to get back to normal.  Other wise, he is doing well mentally and tries not to let things get to him too much.  He sleeps well most of the time. Kaisen is doing well in rehab and feeling much better  overall.  He is sleepign good and feels like he has gotten to a good place mentally.  He has really enjoyed the routine of the program and getting out and about more.      Expected Outcomes Short: Continue to exercise for mental boost Long; Continue to stay positive Short: continue to attend to build stamina Long: Continue to exercise for mental boost      Interventions Encouraged to attend Cardiac Rehabilitation for the exercise Encouraged to attend Cardiac Rehabilitation for the exercise      Continue Psychosocial Services  Follow up required by staff Follow up required by staff               Psychosocial Discharge (Final Psychosocial Re-Evaluation):  Psychosocial Re-Evaluation - 05/19/23 1539       Psychosocial Re-Evaluation   Current issues with Current Stress Concerns    Comments Sony is doing well in rehab and feeling much better overall.  He is sleepign good and feels like he has gotten to a good place mentally.  He has really enjoyed the routine of the program and getting out and about more.    Expected Outcomes Short: continue to attend to build stamina Long: Continue to exercise for mental boost    Interventions Encouraged to attend Cardiac Rehabilitation for the exercise  Continue Psychosocial Services  Follow up required by staff             Vocational Rehabilitation: Provide vocational rehab assistance to qualifying candidates.   Vocational Rehab Evaluation & Intervention:  Vocational Rehab - 03/12/23 1031       Initial Vocational Rehab Evaluation & Intervention   Assessment shows need for Vocational Rehabilitation No      Vocational Rehab Re-Evaulation   Comments Patient is retired.             Education: Education Goals: Education classes will be provided on a weekly basis, covering required topics. Participant will state understanding/return demonstration of topics presented.  Learning Barriers/Preferences:  Learning Barriers/Preferences -  03/12/23 1031       Learning Barriers/Preferences   Learning Barriers None    Learning Preferences Audio;Written Material;Pictoral;Skilled Demonstration             Education Topics: Hypertension, Hypertension Reduction -Define heart disease and high blood pressure. Discus how high blood pressure affects the body and ways to reduce high blood pressure.   Exercise and Your Heart -Discuss why it is important to exercise, the FITT principles of exercise, normal and abnormal responses to exercise, and how to exercise safely. Flowsheet Row CARDIAC REHAB PHASE II EXERCISE from 06/17/2023 in [place] [facility]  Date 03/25/23  Educator HB  Instruction Review Code 1- Verbalizes Understanding       Angina -Discuss definition of angina, causes of angina, treatment of angina, and how to decrease risk of having angina.   Cardiac Medications -Review what the following cardiac medications are used for, how they affect the body, and side effects that may occur when taking the medications.  Medications include Aspirin , Beta blockers, calcium  channel blockers, ACE Inhibitors, angiotensin receptor blockers, diuretics, digoxin, and antihyperlipidemics. Flowsheet Row CARDIAC REHAB PHASE II EXERCISE from 06/17/2023 in [place] [facility]  Date 05/06/23  Educator hb  Instruction Review Code 1- Verbalizes Understanding       Congestive Heart Failure -Discuss the definition of CHF, how to live with CHF, the signs and symptoms of CHF, and how keep track of weight and sodium intake. Flowsheet Row CARDIAC REHAB PHASE II EXERCISE from 06/17/2023 in [place] [facility]  Date 04/29/23  Educator hb  Instruction Review Code 1- Verbalizes Understanding       Heart Disease and Intimacy -Discus the effect sexual activity has on the heart, how changes occur during intimacy as we age, and safety during sexual activity. Flowsheet Row CARDIAC REHAB PHASE II  EXERCISE from 06/17/2023 in [place] [facility]  Date 04/01/23  Educator HB  Instruction Review Code 1- Verbalizes Understanding       Smoking Cessation / COPD -Discuss different methods to quit smoking, the health benefits of quitting smoking, and the definition of COPD.   Nutrition I: Fats -Discuss the types of cholesterol, what cholesterol does to the heart, and how cholesterol levels can be controlled. Flowsheet Row CARDIAC REHAB PHASE II EXERCISE from 06/17/2023 in [place] [facility]  Date 06/17/23  Educator [facility]  Instruction Review Code 1- Verbalizes Understanding       Nutrition II: Labels -Discuss the different components of food labels and how to read food label Flowsheet Row CARDIAC REHAB PHASE II EXERCISE from 06/17/2023 in [place] [facility]  Date 06/17/23  Educator [facility]  Instruction Review Code 1- Verbalizes Understanding       Heart Parts/Heart Disease and PAD -Discuss the anatomy of the  heart, the pathway of blood circulation through the heart, and these are affected by heart disease. Flowsheet Row CARDIAC REHAB PHASE II EXERCISE from 06/17/2023 in [place] [facility]  Date 05/20/23  Educator hb  Instruction Review Code 1- Verbalizes Understanding       Stress I: Signs and Symptoms -Discuss the causes of stress, how stress may lead to anxiety and depression, and ways to limit stress.   Stress II: Relaxation -Discuss different types of relaxation techniques to limit stress.   Warning Signs of Stroke / TIA -Discuss definition of a stroke, what the signs and symptoms are of a stroke, and how to identify when someone is having stroke.   Knowledge Questionnaire Score:  Knowledge Questionnaire Score - 06/29/23 1113       Knowledge Questionnaire Score   Pre Score 21/26    Post Score 25/26             Core Components/Risk Factors/Patient Goals at Admission:  Personal Goals and Risk  Factors at Admission - 03/17/23 1551       Core Components/Risk Factors/Patient Goals on Admission    Weight Management Yes;Weight Maintenance;Weight Loss    Intervention Weight Management: Develop a combined nutrition and exercise program designed to reach desired caloric intake, while maintaining appropriate intake of nutrient and fiber, sodium and fats, and appropriate energy expenditure required for the weight goal.;Weight Management: Provide education and appropriate resources to help participant work on and attain dietary goals.    Admit Weight 205 lb 3.2 oz (93.1 kg)    Goal Weight: Short Term 200 lb (90.7 kg)    Goal Weight: Long Term 198 lb (89.8 kg)    Expected Outcomes Short Term: Continue to assess and modify interventions until short term weight is achieved;Long Term: Adherence to nutrition and physical activity/exercise program aimed toward attainment of established weight goal;Weight Maintenance: Understanding of the daily nutrition guidelines, which includes 25-35% calories from fat, 7% or less cal from saturated fats, less than 200mg  cholesterol, less than 1.5gm of sodium, & 5 or more servings of fruits and vegetables daily;Weight Loss: Understanding of general recommendations for a balanced deficit meal plan, which promotes 1-2 lb weight loss per week and includes a negative energy balance of [phone] kcal/d;Understanding recommendations for meals to include 15-35% energy as protein, 25-35% energy from fat, 35-60% energy from carbohydrates, less than 200mg  of dietary cholesterol, 20-35 gm of total fiber daily;Understanding of distribution of calorie intake throughout the day with the consumption of 4-5 meals/snacks    Diabetes Yes    Intervention Provide education about signs/symptoms and action to take for hypo/hyperglycemia.;Provide education about proper nutrition, including hydration, and aerobic/resistive exercise prescription along with prescribed medications to achieve blood  glucose in normal ranges: Fasting glucose 65-99 mg/dL    Expected Outcomes Short Term: Participant verbalizes understanding of the signs/symptoms and immediate care of hyper/hypoglycemia, proper foot care and importance of medication, aerobic/resistive exercise and nutrition plan for blood glucose control.;Long Term: Attainment of HbA1C < 7%.    Heart Failure Yes    Intervention Provide a combined exercise and nutrition program that is supplemented with education, support and counseling about heart failure. Directed toward relieving symptoms such as shortness of breath, decreased exercise tolerance, and extremity edema.    Expected Outcomes Improve functional capacity of life;Short term: Attendance in program 2-3 days a week with increased exercise capacity. Reported lower sodium intake. Reported increased fruit and vegetable intake. Reports medication compliance.;Short term: Daily weights obtained and reported for  increase. Utilizing diuretic protocols set by physician.;Long term: Adoption of self-care skills and reduction of barriers for early signs and symptoms recognition and intervention leading to self-care maintenance.    Hypertension Yes    Intervention Provide education on lifestyle modifcations including regular physical activity/exercise, weight management, moderate sodium restriction and increased consumption of fresh fruit, vegetables, and low fat dairy, alcohol  moderation, and smoking cessation.;Monitor prescription use compliance.    Expected Outcomes Short Term: Continued assessment and intervention until BP is < 140/4mm HG in hypertensive participants. < 130/40mm HG in hypertensive participants with diabetes, heart failure or chronic kidney disease.;Long Term: Maintenance of blood pressure at goal levels.    Lipids Yes    Intervention Provide education and support for participant on nutrition & aerobic/resistive exercise along with prescribed medications to achieve LDL 70mg , HDL >40mg .     Expected Outcomes Short Term: Participant states understanding of desired cholesterol values and is compliant with medications prescribed. Participant is following exercise prescription and nutrition guidelines.;Long Term: Cholesterol controlled with medications as prescribed, with individualized exercise RX and with personalized nutrition plan. Value goals: LDL < 70mg , HDL > 40 mg.             Core Components/Risk Factors/Patient Goals Review:   Goals and Risk Factor Review     Row Name 04/28/23 1129 05/19/23 1545           Core Components/Risk Factors/Patient Goals Review   Personal Goals Review Weight Management/Obesity;Diabetes;Lipids;Hypertension;Heart Failure Weight Management/Obesity;Diabetes;Lipids;Hypertension;Heart Failure      Review Dainel is doing well in rehab.  His weight has been staying steady.  He is feeling good overall.  His pressures are doing well and he checks them routinely and gets 120s/60s. He is now off his carvedilol  as his pressures were too low.  He is feeling better overall.  He was going in for a follow up appt  from hospitalization for bleeding and hoping to get a good reveiw.  His review was good. Sinan is doing well in rehab.  His weight is steady.  He is now off the amiodarone  and back in rhythm and feeling good.  His pressures are doing well and has not had any swings with his sugars.  he is feeling good overall. He has not had any heart failure symptoms recently      Expected Outcomes Continue to stay in contact with doctor about bleeding Long: Continue to montior risk factors SHort: Continue to do self checks for heart failure Long; Conitnue to monitor risk factors               Core Components/Risk Factors/Patient Goals at Discharge (Final Review):   Goals and Risk Factor Review - 05/19/23 1545       Core Components/Risk Factors/Patient Goals Review   Personal Goals Review Weight Management/Obesity;Diabetes;Lipids;Hypertension;Heart Failure     Review Nyjah is doing well in rehab.  His weight is steady.  He is now off the amiodarone  and back in rhythm and feeling good.  His pressures are doing well and has not had any swings with his sugars.  he is feeling good overall. He has not had any heart failure symptoms recently    Expected Outcomes SHort: Continue to do self checks for heart failure Long; Conitnue to monitor risk factors             ITP Comments:  ITP Comments     Row Name 03/17/23 1554 03/23/23 1040 04/07/23 1549 05/05/23 0805 06/02/23 1115   ITP Comments  Patient arrived for 1st visit/orientation/education at 1400. Patient was referred to CR by Dr. Vinie Maxcy due to Chronic systolic HF. During orientation advised patient on arrival and appointment times what to wear, what to do before, during and after exercise. Reviewed attendance and class policy.  Pt is scheduled to return Cardiac Rehab on Tuesday 03/23/23 at 1330. Pt was advised to come to class 15 minutes before class starts.  Discussed RPE/Dpysnea scales. Patient participated in warm up stretches. Patient was able to complete 6 minute walk test.  Telemetry:Ventricular Paced Rhythm. Patient was measured for the equipment. Discussed equipment safety with patient. Took patient pre-anthropometric measurements. Patient finished visit at 1510. First full day of exercise!  Patient was oriented to gym and equipment including functions, settings, policies, and procedures.  Patient's individual exercise prescription and treatment plan were reviewed.  All starting workloads were established based on the results of the 6 minute walk test done at initial orientation visit.  The plan for exercise progression was also introduced and progression will be customized based on patient's performance and goals. 30 day review completed. ITP sent to Dr. Dorn Ross, Medical Director of Cardiac Rehab. Continue with ITP unless changes are made by physician. 30 day review completed. ITP sent to  Dr. Dorn Ross, Medical Director of Cardiac Rehab. Continue with ITP unless changes are made by physician. 30 day review completed. ITP sent to Dr. Dorn Ross, Medical Director of Cardiac Rehab. Continue with ITP unless changes are made by physician.    Row Name 06/29/23 1045           ITP Comments Shriyan is headed to [place]  until March and today will be his last visit with us .   Wadie graduated today from  rehab with 29 sessions completed.  Details of the patient's exercise prescription and what He needs to do in order to continue the prescription and progress were discussed with patient.  Patient was given a copy of prescription and goals.  Patient verbalized understanding. Secundino plans to continue to exercise by working out at facility in [place] .                Comments: Discharge ITP

## 2023-06-29 NOTE — Progress Notes (Signed)
 Discharge Progress Report  Patient Details  Name: [name] [name] MRN: [id number] Date of Birth: [birth year] Referring Provider:   Flowsheet Row CARDIAC REHAB PHASE II ORIENTATION from 03/17/2023 in [facility]  Referring Provider Mona Kent MD        Number of Visits: 29  Reason for Discharge:  Patient reached a stable level of exercise. Patient independent in their exercise. Patient has met program and personal goals. Early Exit:  Going to [place]  for Winter months  Smoking History:  Social History   Tobacco Use  Smoking Status Former   Types: Cigars   Quit date: 09/07/2011   Years since quitting: 11.8  Smokeless Tobacco Former   Quit date: 02/28/2012  Tobacco Comments   Former smoker 02/16/23    Diagnosis:  Heart failure, chronic systolic (HCC)  Initial Exercise Prescription:  Initial Exercise Prescription - 03/17/23 1500       Date of Initial Exercise RX and Referring Provider   Date 03/17/23    Referring Provider Mona Kent MD      Oxygen    Maintain Oxygen  Saturation 88% or higher      NuStep   Level 1    SPM 80    Minutes 15    METs 2      REL-XR   Level 1    Speed 50    Minutes 15    METs 2      Prescription Details   Frequency (times per week) 2    Duration Progress to 30 minutes of continuous aerobic without signs/symptoms of physical distress      Intensity   THRR 40-80% of Max Heartrate 87-124    Ratings of Perceived Exertion 11-13    Perceived Dyspnea 0-4      Progression   Progression Continue to progress workloads to maintain intensity without signs/symptoms of physical distress.      Resistance Training   Training Prescription Yes    Weight 4 lb    Reps 10-15             Discharge Exercise Prescription (Final Exercise Prescription Changes):  Exercise Prescription Changes - 06/17/23 1200       Response to Exercise   Blood Pressure (Admit) 110/64    Blood Pressure (Exit) 102/50    Heart  Rate (Admit) 80 bpm    Heart Rate (Exercise) 99 bpm    Heart Rate (Exit) 73 bpm    Rating of Perceived Exertion (Exercise) 13    Duration Continue with 30 min of aerobic exercise without signs/symptoms of physical distress.    Intensity THRR unchanged      Progression   Progression Continue to progress workloads to maintain intensity without signs/symptoms of physical distress.      Resistance Training   Training Prescription Yes    Weight 4 lbs    Reps 10-15      NuStep   Level 3    SPM 97    Minutes 15    METs 2.4      REL-XR   Level 4    Speed 43    Minutes 15    METs 2.6      Oxygen    Maintain Oxygen  Saturation 88% or higher             Functional Capacity:  6 Minute Walk     Row Name 03/17/23 1545 06/29/23 1047       6 Minute Walk   Phase Initial Discharge  Distance 733 feet 490 feet    Walk Time 5.03 minutes 5.1 minutes    # of Rest Breaks 2  21 sec, 36 sec 1    MPH 1.66 0.93    METS 1.41 0.83    RPE 14 14    Perceived Dyspnea  3 1    VO2 Peak 4.93 2.91    Symptoms Yes (comment) Yes (comment)    Comments SOB, walking with limp from leg pain poor circulation, fatigue across shoulders Standing break due to LLE weakness from poor circulation    Resting HR 51 bpm 75 bpm    Resting BP 100/50 110/60    Resting Oxygen  Saturation  97 % 98 %    Exercise Oxygen  Saturation  during 6 min walk 98 % 96 %    Max Ex. HR 87 bpm 85 bpm    Max Ex. BP 126/74 120/60    2 Minute Post BP 114/56 116/60             Psychological, QOL, Others - Outcomes: PHQ 2/9:    06/29/2023   11:14 AM 03/17/2023    2:47 PM 05/01/2021   10:36 AM 02/13/2021   10:53 AM 12/11/2020    9:11 AM  Depression screen PHQ 2/9  Decreased Interest 1 0 0 0 0  Down, Depressed, Hopeless 0 0 0 0 0  PHQ - 2 Score 1 0 0 0 0  Altered sleeping 1 1     Tired, decreased energy 1 1     Change in appetite 0 1     Feeling bad or failure about yourself  0 0     Trouble concentrating 0 0      Moving slowly or fidgety/restless 0 0     Suicidal thoughts 0 0     PHQ-9 Score 3 3     Difficult doing work/chores Somewhat difficult Somewhat difficult       Quality of Life:  Quality of Life - 06/29/23 1113       Quality of Life   Select Quality of Life      Quality of Life Scores   Health/Function Pre 19.83 %    Health/Function Post 14.82 %    Health/Function % Change -25.26 %    Socioeconomic Pre 24.38 %    Socioeconomic Post 30 %    Socioeconomic % Change  23.05 %    Psych/Spiritual Pre 18.86 %    Psych/Spiritual Post 30 %    Psych/Spiritual % Change 59.07 %    Family Pre 27.6 %    Family Post 30 %    Family % Change 8.7 %    GLOBAL Pre 21.79 %    GLOBAL Post 23.15 %    GLOBAL % Change 6.24 %               Nutrition & Weight - Outcomes:  Pre Biometrics - 03/17/23 1551       Pre Biometrics   Height 6' 0.5 (1.842 m)    Weight 93.1 kg    Waist Circumference 40 inches    Hip Circumference 41.5 inches    Waist to Hip Ratio 0.96 %    BMI (Calculated) 27.43    Grip Strength 17.9 kg    Single Leg Stand 1.8 seconds             Post Biometrics - 06/29/23 1049        Post  Biometrics   Height 6' 0.5 (1.842 m)  Weight 95.2 kg    Waist Circumference 42 inches    Hip Circumference 42 inches    Waist to Hip Ratio 1 %    BMI (Calculated) 28.06    Grip Strength 20.6 kg    Single Leg Stand 0 seconds            Education Questionnaire Score:  Knowledge Questionnaire Score - 06/29/23 1113       Knowledge Questionnaire Score   Pre Score 21/26    Post Score 25/26           Goals reviewed with patient; copy given to patient.

## 2023-07-01 ENCOUNTER — Encounter (HOSPITAL_COMMUNITY): Payer: Medicare Other

## 2023-07-06 ENCOUNTER — Encounter (HOSPITAL_COMMUNITY): Payer: Medicare Other

## 2023-07-08 ENCOUNTER — Encounter (HOSPITAL_COMMUNITY): Payer: Medicare Other

## 2023-07-12 ENCOUNTER — Encounter (HOSPITAL_COMMUNITY): Payer: Medicare Other | Attending: Cardiology

## 2023-07-12 DIAGNOSIS — I5022 Chronic systolic (congestive) heart failure: Secondary | ICD-10-CM | POA: Insufficient documentation

## 2023-07-12 DIAGNOSIS — Z95810 Presence of automatic (implantable) cardiac defibrillator: Secondary | ICD-10-CM | POA: Insufficient documentation

## 2023-07-13 ENCOUNTER — Encounter (HOSPITAL_COMMUNITY): Payer: Medicare Other

## 2023-07-13 NOTE — Progress Notes (Signed)
 EPIC Encounter for ICM Monitoring  Patient Name: [name] [name] is a 78 y.o. male Date: 07/13/2023 Primary Care Physican: Atilano Deward ORN, MD Primary Cardiologist: [facility]        Electrophysiologist: Inocencio Bi-V Pacing: 91.6%         09/21/2022 Weight: 213 lbs 10/06/2022 Weight: 219 lbs  11/17/2022 Weight: 212.5 lbs 12/22/2022 Weight: 211 lbs 01/13/2023 Weight: 209 lbs 05/14/2023 Office Weight: 204 lbs 12/31/024 Weight: 207.5 lbs 07/13/2023 Weight: 212 lbs   Clinical Status Since 07-Jun-2023 Time in AT/AF  0.0 hr/day (0.0%)                                                          Spoke with patient and heart failure questions reviewed.  Transmission results reviewed.  Pt asymptomatic for fluid accumulation.  Reports feeling well at this time and voices no complaints.  Weight has gradually increased by 8 lbs since 11/4.    Optivol thoracic impedance trending slightly below baseline since 11/14 suggesting ongoing possible fluid accumulation but he does not think he is having any fluid symptoms.     Prescribed:  Furosemide  40 mg take 1 tablet(s) (40 mg total) by mouth twice a day.  Take an additional 20 mg once a day as needed for any sudden weight gain of 3 lbs overnight or 5 lbs in a week or any lower extremity swelling or SOB.     Labs: 06/18/2023 Creatinine 1.26, BUN 19, Potassium 4.4, Sodium 142, GFR 58, BNP 428.5 05/14/2023 Creatinine 1.30, BUN 20, Potassium 4.2, Sodium 142, GFR 56 05/05/2023 Creatinine 1.30, BUN 18, Potassium 4.3, Sodium 142, GFR 56, BNP 398.0 04/27/2023 Creatinine 1.12, BUN 14, Potassium 4.5, Sodium 141, GFR 67, BNP 571.5 04/20/2023 Creatinine 1.07, BUN 12, Potassium 3.5, Sodium 134, GFR >60  04/17/2023 Creatinine 0.92, BUN 20, Potassium 3.7, Sodium 136, GFR >60  04/09/2023 Creatinine 1.06, BUN 22, Potassium 3.5, Sodium 136, GFR >60  01/05/2023 Creatinine 1.27, BUN 22, Potassium 4.2, Sodium 142, GFR 58 A complete set of results can be found in Results Review.    Recommendations:  Pt is taking Lasix  additional 20 mg of Lasix  in the mornings x 2-3 weeks to he always feels SOB.   Copy sent to Dr Mona as [name] regarding he takes extra 20 mg daily needed for SOB and report continues to suggest fluid accumulation.     Follow-up plan: ICM clinic phone appointment on 08/03/2023.   91 day device clinic remote transmission 08/02/2023.     EP/Cardiology Office Visits:  09/03/2023 with Dr Mona.   Provided EP scheduler office and advised to call in the next month to schedule yearly visit with Dr Inocencio.  Recall 08/02/2023 with Dr Inocencio.      Copy of ICM check sent to Dr. Inocencio.     3 month ICM trend: 07/12/2023.    12-14 Month ICM trend:     Mitzie GORMAN Garner, RN 07/13/2023 3:07 PM

## 2023-07-13 NOTE — Progress Notes (Signed)
 Message Received: Today [name] [name], [name] Abu, MD  Dodi Leu, Josephine Igo, RN Thanks .Marland Kitchen He had recent need for SubQ lasix - he is aware of plan to titrate lasix for weight gain - we'll monitor it.  Dr. Rexene Edison

## 2023-07-15 ENCOUNTER — Encounter (HOSPITAL_COMMUNITY): Payer: Medicare Other

## 2023-07-20 ENCOUNTER — Encounter (HOSPITAL_COMMUNITY): Payer: Medicare Other

## 2023-07-25 ENCOUNTER — Encounter: Payer: Self-pay | Admitting: Internal Medicine

## 2023-08-02 ENCOUNTER — Telehealth: Payer: Self-pay | Admitting: Internal Medicine

## 2023-08-02 ENCOUNTER — Encounter: Payer: Self-pay | Admitting: Internal Medicine

## 2023-08-02 ENCOUNTER — Ambulatory Visit (INDEPENDENT_AMBULATORY_CARE_PROVIDER_SITE_OTHER): Payer: Medicare Other

## 2023-08-02 DIAGNOSIS — I255 Ischemic cardiomyopathy: Secondary | ICD-10-CM

## 2023-08-02 DIAGNOSIS — I5022 Chronic systolic (congestive) heart failure: Secondary | ICD-10-CM

## 2023-08-02 MED ORDER — CARVEDILOL 3.125 MG PO TABS: 3.1250 mg | ORAL_TABLET | Freq: Two times a day (BID) | ORAL | 1 refills | Status: DC

## 2023-08-02 NOTE — Telephone Encounter (Signed)
Pt c/o Shortness Of Breath: STAT if SOB developed within the last 24 hours or pt is noticeably SOB on the phone  1. Are you currently SOB (can you hear that pt is SOB on the phone)?  Yes since being taken off of Carvedilol  2. How long have you been experiencing SOB?   Ongoing  3. Are you SOB when sitting or when up moving around?   4. Are you currently experiencing any other symptoms?    Daughter wants a call back to discuss patient's medication and next steps.

## 2023-08-02 NOTE — Telephone Encounter (Signed)
Rx sent to pharmacy in [place], per MyChart message

## 2023-08-02 NOTE — Telephone Encounter (Signed)
[name], MD  You2 hours ago (2:20 PM)    I'm not sure that carvedilol is the issue - he has been consistently concerned about being off this. He was previously on 3.125 mg BID - can re-try it if he wants. I think he needs to be seen - but since he has been out of state, this is hard to do.  Dr. Rexene Edison

## 2023-08-02 NOTE — Telephone Encounter (Signed)
Returned call to daughter, [name] [name]. She is calling about her dad (patient of Dr. [name])  October 2024 - 3 polyps removed, subsequent GI bleeding Ever since this episode, he has been on "downward spiral" Was taken off lots of meds (including carvedilol) while in hospital, started on midodrine but no longer taking  His O2 is OK. BP is stable -- 110s-120s Very short of breath - unable to walk even short distances in the house He is very fatigued Weights stable per 07/25/23 MyChart message Eating & drinking OK Blood sugar OK - on metformin  Daughter said he was doing better on carvedilol even with a little bit lower BP than he is now at present state.   Daughter is concerned that b/c he is OFF carvedilol that could be contributing to fatigue, as this med helps to reduces cardiac workload.   Daughter is asking if he could try even a half-dose of carvedilol. Reviewed with daughter the MyChart message thread from 07/25/23  He has not seen a provider in [place] and will not be back to [place] for a few weeks. He has been there since January.   Advised will send a message to MD to review

## 2023-08-02 NOTE — Telephone Encounter (Signed)
Spoke with patient's daughter. Relayed message from MD.  He has a visit scheduled on 09/03/23  He would like to re-trial carvedilol.  Daughter will update which pharmacy to use as patient is in [place] right now

## 2023-08-03 ENCOUNTER — Encounter: Payer: Medicare Other | Attending: Cardiology

## 2023-08-03 DIAGNOSIS — Z95810 Presence of automatic (implantable) cardiac defibrillator: Secondary | ICD-10-CM | POA: Insufficient documentation

## 2023-08-03 DIAGNOSIS — I5022 Chronic systolic (congestive) heart failure: Secondary | ICD-10-CM | POA: Insufficient documentation

## 2023-08-03 LAB — CUP PACEART REMOTE DEVICE CHECK
Battery Remaining Longevity: 86 mo
Battery Voltage: 3.01 V
Brady Statistic AP VP Percent: 14.4 %
Brady Statistic AP VS Percent: 0.09 %
Brady Statistic AS VP Percent: 81.75 %
Brady Statistic AS VS Percent: 3.77 %
Brady Statistic RA Percent Paced: 13.47 %
Brady Statistic RV Percent Paced: 87.62 %
Date Time Interrogation Session: 20250203044224
HighPow Impedance: 51 Ohm
Implantable Lead Connection Status: 753985
Implantable Lead Connection Status: 753985
Implantable Lead Connection Status: 753985
Implantable Lead Implant Date: 20231102
Implantable Lead Implant Date: 20231102
Implantable Lead Implant Date: 20231102
Implantable Lead Location: 753858
Implantable Lead Location: 753859
Implantable Lead Location: 753860
Implantable Lead Model: 4598
Implantable Lead Model: 5076
Implantable Lead Model: 6935
Implantable Pulse Generator Implant Date: 20231102
Lead Channel Impedance Value: 136.276
Lead Channel Impedance Value: 136.276
Lead Channel Impedance Value: 136.276
Lead Channel Impedance Value: 152 Ohm
Lead Channel Impedance Value: 152 Ohm
Lead Channel Impedance Value: 247 Ohm
Lead Channel Impedance Value: 247 Ohm
Lead Channel Impedance Value: 304 Ohm
Lead Channel Impedance Value: 304 Ohm
Lead Channel Impedance Value: 304 Ohm
Lead Channel Impedance Value: 323 Ohm
Lead Channel Impedance Value: 399 Ohm
Lead Channel Impedance Value: 456 Ohm
Lead Channel Impedance Value: 456 Ohm
Lead Channel Impedance Value: 456 Ohm
Lead Channel Impedance Value: 494 Ohm
Lead Channel Impedance Value: 513 Ohm
Lead Channel Impedance Value: 513 Ohm
Lead Channel Pacing Threshold Amplitude: 0.5 V
Lead Channel Pacing Threshold Amplitude: 0.875 V
Lead Channel Pacing Threshold Amplitude: 1 V
Lead Channel Pacing Threshold Pulse Width: 0.4 ms
Lead Channel Pacing Threshold Pulse Width: 0.4 ms
Lead Channel Pacing Threshold Pulse Width: 0.4 ms
Lead Channel Sensing Intrinsic Amplitude: 1.25 mV
Lead Channel Sensing Intrinsic Amplitude: 1.25 mV
Lead Channel Sensing Intrinsic Amplitude: 7 mV
Lead Channel Sensing Intrinsic Amplitude: 7 mV
Lead Channel Setting Pacing Amplitude: 1.5 V
Lead Channel Setting Pacing Amplitude: 2 V
Lead Channel Setting Pacing Amplitude: 2 V
Lead Channel Setting Pacing Pulse Width: 0.4 ms
Lead Channel Setting Pacing Pulse Width: 0.4 ms
Lead Channel Setting Sensing Sensitivity: 0.3 mV
Zone Setting Status: 755011

## 2023-08-04 NOTE — Progress Notes (Signed)
  Received: Today [name], Lisette Abu, MD  Elizabelle Fite, Josephine Igo, RN Thanks

## 2023-08-04 NOTE — Progress Notes (Signed)
 EPIC Encounter for ICM Monitoring  Patient Name: [name] [name] is a 78 y.o. male Date: 08/04/2023 Primary Care Physican: Atilano Deward ORN, MD Primary Cardiologist: [facility]        Electrophysiologist: Camnitz Bi-V Pacing: 87.6%         09/21/2022 Weight: 213 lbs 10/06/2022 Weight: 219 lbs  11/17/2022 Weight: 212.5 lbs 12/22/2022 Weight: 211 lbs 01/13/2023 Weight: 209 lbs 05/14/2023 Office Weight: 204 lbs 12/31/024 Weight: 207.5 lbs 07/13/2023 Weight: 212 lbs 08/04/2023 Weight: 213 lbs   Clinical Status Since 12-Jul-2023 Time in AT/AF  0.0 hr/day (0.0%)                                                          Spoke with patient and heart failure questions reviewed.  Transmission results reviewed.  Pt asymptomatic for fluid accumulation.  He is currently in [place]  and will return home 3/1.    Optivol thoracic impedance continues to suggest possible fluid accumulation since 11/14 with intermittent days showing improvement but has not returned to normal.      Prescribed:  Furosemide  40 mg take 1 tablet(s) (40 mg total) by mouth twice a day.  Take an additional 20 mg once a day as needed for any sudden weight gain of 3 lbs overnight or 5 lbs in a week or any lower extremity swelling or SOB.     Labs: 06/18/2023 Creatinine 1.26, BUN 19, Potassium 4.4, Sodium 142, GFR 58, BNP 428.5 05/14/2023 Creatinine 1.30, BUN 20, Potassium 4.2, Sodium 142, GFR 56 05/05/2023 Creatinine 1.30, BUN 18, Potassium 4.3, Sodium 142, GFR 56, BNP 398.0 04/27/2023 Creatinine 1.12, BUN 14, Potassium 4.5, Sodium 141, GFR 67, BNP 571.5 04/20/2023 Creatinine 1.07, BUN 12, Potassium 3.5, Sodium 134, GFR >60  04/17/2023 Creatinine 0.92, BUN 20, Potassium 3.7, Sodium 136, GFR >60  04/09/2023 Creatinine 1.06, BUN 22, Potassium 3.5, Sodium 136, GFR >60  01/05/2023 Creatinine 1.27, BUN 22, Potassium 4.2, Sodium 142, GFR 58 A complete set of results can be found in Results Review.   Recommendations:  He confirms taking lasix  40 mg  twice a day and discussed when to take extra 20 mg such as weight gain of 3 lbs overnight or 5 lbs in a week, leg swelling or if he develops SOB.     Follow-up plan: ICM clinic phone appointment on 09/01/2023 to recheck fluid levels prior to 3/7 OV.   91 day device clinic remote transmission 11/01/2023.     EP/Cardiology Office Visits:  09/03/2023 with Dr Mona.   He has EP scheduler number to schedule yearly visit with Dr Inocencio.  Recall 08/02/2023 with Dr Inocencio.      Copy of ICM check sent to Dr. Inocencio and Dr Mona as [name].      3 month ICM trend: 08/03/2023.    12-14 Month ICM trend:     Mitzie GORMAN Garner, RN 08/04/2023 1:35 PM

## 2023-08-23 DIAGNOSIS — S81802A Unspecified open wound, left lower leg, initial encounter: Secondary | ICD-10-CM | POA: Diagnosis not present

## 2023-08-31 ENCOUNTER — Encounter: Payer: Self-pay | Admitting: Internal Medicine

## 2023-08-31 ENCOUNTER — Telehealth: Payer: Self-pay | Admitting: *Deleted

## 2023-08-31 ENCOUNTER — Encounter: Payer: Self-pay | Admitting: *Deleted

## 2023-08-31 ENCOUNTER — Ambulatory Visit (INDEPENDENT_AMBULATORY_CARE_PROVIDER_SITE_OTHER): Payer: Medicare Other | Admitting: Internal Medicine

## 2023-08-31 VITALS — BP 120/56 | HR 64 | Temp 97.6°F | Ht 74.0 in | Wt 210.8 lb

## 2023-08-31 DIAGNOSIS — E7800 Pure hypercholesterolemia, unspecified: Secondary | ICD-10-CM | POA: Diagnosis not present

## 2023-08-31 DIAGNOSIS — F1091 Alcohol use, unspecified, in remission: Secondary | ICD-10-CM

## 2023-08-31 DIAGNOSIS — C187 Malignant neoplasm of sigmoid colon: Secondary | ICD-10-CM

## 2023-08-31 DIAGNOSIS — Z87891 Personal history of nicotine dependence: Secondary | ICD-10-CM | POA: Diagnosis not present

## 2023-08-31 DIAGNOSIS — D509 Iron deficiency anemia, unspecified: Secondary | ICD-10-CM

## 2023-08-31 DIAGNOSIS — Z85048 Personal history of other malignant neoplasm of rectum, rectosigmoid junction, and anus: Secondary | ICD-10-CM | POA: Diagnosis not present

## 2023-08-31 DIAGNOSIS — E7849 Other hyperlipidemia: Secondary | ICD-10-CM | POA: Diagnosis not present

## 2023-08-31 DIAGNOSIS — D638 Anemia in other chronic diseases classified elsewhere: Secondary | ICD-10-CM | POA: Diagnosis not present

## 2023-08-31 DIAGNOSIS — E782 Mixed hyperlipidemia: Secondary | ICD-10-CM | POA: Diagnosis not present

## 2023-08-31 DIAGNOSIS — I5033 Acute on chronic diastolic (congestive) heart failure: Secondary | ICD-10-CM | POA: Diagnosis not present

## 2023-08-31 DIAGNOSIS — Z131 Encounter for screening for diabetes mellitus: Secondary | ICD-10-CM | POA: Diagnosis not present

## 2023-08-31 NOTE — Patient Instructions (Signed)
 It was nice to see you again today!  As discussed with you and your daughter, Collier Flowers, the large polyp removed from your colon contain a small amount of cancer.  After seeing Dr. [name] it was felt that resection of your colon would be too risky.  Next best thing is to go back and check your colon with the scope and make sure that all of the polyp was removed.  As discussed, not going back and following you clinically is also an option.   Given this situation, it is recommended we go back and look at the polypectomy site. If there is any residual, I would plan to remove it.  You suffered a post polypectomy bleed last year.  There is always a risk of bleeding with a colonoscopy particularly if Eliquis is involved.  We will take every precaution to minimize the chances of bleeding with the next colonoscopy.  We will wait to hear from Dr. [name] before performing the procedure.  Eliquis needs to be stopped 2 days prior to the procedure.

## 2023-08-31 NOTE — Progress Notes (Unsigned)
 Primary Care Physician:  [name], MD Primary Gastroenterologist:  Dr. [name]  Pre-Procedure History & Physical: HPI:  [name] [name] is a 78 y.o. 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  Saw Dr. [name].  Thoughtful consultation ensued.  Patient's comorbidities makes him significant and nearly prohibitive operative risk for a segmental resection.  I discussed with Dr. [name].  We both felt all things considered to be very reasonable in this setting to just follow-up the polypectomy site and see if there is any residual polyp tissue remaining.  Unfortunately, patient developed a 1 unit post polypectomy bleed requiring additional clips by Dr. Levon Hedger few days after his index colonoscopy.  I spoke to his daughter Collier Flowers today as well the patient at length.  Unfortunately, the post polypectomy bleed set him back,  he has remained somewhat weak since that time;  a little bit unsteady his feet.  Uses a cane to ambulate.  Initial CEA 1.8.  Last hemoglobin 10.8 from back in November has not had any further hematochezia or melena; no upper GI tract symptoms.  He was found to have Candida esophagitis and non-H. pylori gastritis last fall as well.  He was treated with Diflucan.  He continues to be anticoagulated with Eliquis.  Hemoglobin was 11.6 just before the index colonoscopy.  He was documented to be iron deficient back in November.  Past Medical History:  Diagnosis Date   AICD (automatic cardioverter/defibrillator) present    Arthritis    Knee L - probably   Atrial fibrillation (HCC)    BPH (benign prostatic hyperplasia)    CAD (coronary artery disease) 07/06/2011   CHF (congestive heart failure) (HCC)    Coronary artery disease    Diabetes mellitus    Frequency of urination    Heart failure  (HCC)    High cholesterol    History of nuclear stress test 09/2010   dipyridamole; mild perfusion defect due to attenuation with mild superimposed ischemia in apical septal, apical, apical inferior & apical lateral regions; rest LV enlarged in size; prominent gut uptake in infero-apical region; no significant ischemia demonstrated; low risk scan    HNP (herniated nucleus pulposus), lumbar    Hypertension    Ischemic cardiomyopathy    LV dysfunction 07/06/2011   Nocturia    Right bundle branch block    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 Wayland Salinas   Sleep apnea    sleep study 10/2010- AHI during total sleep 32.1/hr and during REM 62.3/hr (severe sleep apnea)unable to tolerate c pap   Spinal stenosis of lumbar region    Stroke [facility])    [place]     Past Surgical History:  Procedure Laterality Date   ABDOMINAL AORTOGRAM W/LOWER EXTREMITY Left 11/14/2020   Procedure: ABDOMINAL AORTOGRAM W/LOWER EXTREMITY;  Surgeon: Cephus Shelling, MD;  Location: [facility] INVASIVE CV LAB;  Service: Cardiovascular;  Laterality: Left;   ABDOMINAL AORTOGRAM W/LOWER EXTREMITY N/A 01/02/2021   Procedure: ABDOMINAL AORTOGRAM W/LOWER EXTREMITY;  Surgeon: Cephus Shelling, MD;  Location: [facility] INVASIVE CV LAB;  Service: Cardiovascular;  Laterality: N/A;   AMPUTATION Left 03/20/2021   Procedure: Left foot 5th ray amputation;  Surgeon: Toni Arthurs, MD;  Location: [facility] OR;  Service: Orthopedics;  Laterality: Left;   ATRIAL FIBRILLATION ABLATION N/A 01/19/2023   Procedure: ATRIAL FIBRILLATION ABLATION;  Surgeon: [name], MD;  Location: [facility] INVASIVE CV LAB;  Service: Cardiovascular;  Laterality: N/A;   BIOPSY  04/14/2023   Procedure: BIOPSY;  Surgeon: Corbin Ade, MD;  Location: [facility] ENDO SUITE;  Service: Endoscopy;;   BIV ICD INSERTION CRT-D N/A 04/30/2022   Procedure: BIV ICD INSERTION CRT-D;  Surgeon: [name], MD;  Location: [facility] INVASIVE CV LAB;  Service: Cardiovascular;  Laterality: N/A;   CARDIAC CATHETERIZATION  01/2011   ischemic cardiomyopathy 30-35%, multivessel CAD (Dr. Bishop Limbo)    CARDIAC CATHETERIZATION  09/13/2015   Procedure: Right/Left Heart Cath and Coronary/Graft Angiography;  Surgeon: [name], MD;  Location: [facility] INVASIVE CV LAB;  Service: Cardiovascular;;   CARDIOVERSION N/A 05/16/2019   Procedure: CARDIOVERSION;  Surgeon: [name], MD;  Location: [facility] ENDOSCOPY;  Service: Cardiovascular;  Laterality: N/A;   CARDIOVERSION N/A 10/16/2022   Procedure: CARDIOVERSION;  Surgeon: Thurmon Fair, MD;  Location: [facility] INVASIVE CV LAB;  Service: Cardiovascular;  Laterality: N/A;   CARDIOVERSION N/A 12/16/2022   Procedure: CARDIOVERSION;  Surgeon: Little Ishikawa, MD;  Location: [facility] INVASIVE CV LAB;  Service: Cardiovascular;  Laterality: N/A;   COLONOSCOPY WITH PROPOFOL N/A 04/14/2023   Procedure: COLONOSCOPY WITH PROPOFOL;  Surgeon: Corbin Ade, MD;  Location: [facility];  Service: Endoscopy;  Laterality: N/A;  11:00am, asa 3/4   COLONOSCOPY WITH PROPOFOL N/A 04/18/2023   Procedure: COLONOSCOPY WITH PROPOFOL;  Surgeon: Dolores Frame, MD;  Location: [facility] ENDO [facility];  Service: Gastroenterology;  Laterality: N/A;   Colonscopy     CORONARY ARTERY BYPASS GRAFT  08/03/2011   Procedure: CORONARY ARTERY BYPASS GRAFTING (CABG);  Surgeon: Alleen Borne, MD;  Location: [facility] OR;  Service: Open Heart Surgery;  Laterality: N/A;  CABG times three using right saphenous vein and left mammary artery usisng endoscope.   ESOPHAGEAL BRUSHING  04/14/2023   Procedure: ESOPHAGEAL BRUSHING;  Surgeon: Corbin Ade, MD;  [facility]: [facility];  Service: Endoscopy;;   ESOPHAGOGASTRODUODENOSCOPY (EGD) WITH PROPOFOL N/A 04/14/2023   Procedure: ESOPHAGOGASTRODUODENOSCOPY (EGD) WITH PROPOFOL;  Surgeon: Corbin Ade, MD;  [facility]: [facility];  Service: Endoscopy;  Laterality: N/A;    HEMOSTASIS CLIP PLACEMENT  04/14/2023   Procedure: HEMOSTASIS CLIP PLACEMENT;  Surgeon: Corbin Ade, MD;  [facility];  Service: Endoscopy;;   LEFT HEART CATH AND CORS/GRAFTS ANGIOGRAPHY N/A 12/15/2021   Procedure: LEFT HEART CATH AND CORS/GRAFTS ANGIOGRAPHY;  Surgeon: Tonny Bollman, MD;  Location: [facility] INVASIVE CV LAB;  Service: Cardiovascular;  Laterality: N/A;   LEFT HEART CATHETERIZATION WITH CORONARY/GRAFT ANGIOGRAM N/A 09/20/2013   Procedure: LEFT HEART CATHETERIZATION WITH Isabel Caprice;  Surgeon: [name], MD;  Location: [facility] CATH LAB;  Service: Cardiovascular;  Laterality: N/A;   Lower Extremity Arterial Doppler  03/16/2013   bilat ABIs demonstated normal values; R runoff - posterior tibial & anterior tibial arteries occluded; L runoff - peroneal & posterior tibial arteries occluded, anterior tibial artery appears occluded   LUMBAR LAMINECTOMY/DECOMPRESSION MICRODISCECTOMY N/A 09/12/2014   Procedure: LUMBAR DECOMPRESSION L3-L4, L4-L5, MICRODISCECTOMY L4-L5;  Surgeon: Jene Every, MD;  Location: [facility] ORS;  Service: Orthopedics;  Laterality: N/A;   PERIPHERAL VASCULAR BALLOON ANGIOPLASTY  11/14/2020   Procedure: PERIPHERAL VASCULAR BALLOON ANGIOPLASTY;  Surgeon: Cephus Shelling, MD;  Location: [facility] INVASIVE CV LAB;  Service: Cardiovascular;;   PERIPHERAL VASCULAR BALLOON ANGIOPLASTY Left 01/02/2021  Procedure: PERIPHERAL VASCULAR BALLOON ANGIOPLASTY;  Surgeon: Cephus Shelling, MD;  Location: [facility] INVASIVE CV LAB;  Service: Cardiovascular;  Laterality: Left;  Failed PTA   Pilonidal Cyst removed     POLYPECTOMY  04/14/2023   Procedure: POLYPECTOMY INTESTINAL;  Surgeon: Corbin Ade, MD;  [facility]: [facility];  Service: Endoscopy;;   SCLEROTHERAPY  04/14/2023   Procedure: [name];  Surgeon: Corbin Ade, MD;  [facility];  Service: Endoscopy;;   SUBMUCOSAL LIFTING INJECTION  04/14/2023   Procedure: SUBMUCOSAL LIFTING INJECTION;   Surgeon: Corbin Ade, MD;  [facility]: [facility];  Service: Endoscopy;;   SUBMUCOSAL TATTOO INJECTION  04/14/2023   Procedure: SUBMUCOSAL TATTOO INJECTION;  Surgeon: Corbin Ade, MD;  Location: [facility];  Service: Endoscopy;;   TRANSTHORACIC ECHOCARDIOGRAM  11/10/2012   EF 40-45%, mild LVH, mild hypokinesis of anteroseptal myocardium, grade 1 diastolic dysfunction; mild MR & calcifed mitral annulus; LA mild-mod dilated; RA mildly dilated    Prior to Admission medications   Medication Sig Start Date End Date Taking? Authorizing Provider  acetaminophen (TYLENOL) 500 MG tablet Take 500-1,000 mg by mouth every 6 (six) hours as needed (for pain.).   Yes [provider]  apixaban (ELIQUIS) 5 MG TABS tablet Take 1 tablet (5 mg total) by mouth 2 (two) times daily. 04/26/23  Yes Kathlen Mody, MD  carvedilol (COREG) 3.125 MG tablet Take 1 tablet (3.125 mg total) by mouth 2 (two) times daily. 08/02/23 10/31/23 Yes [name], Lisette Abu, MD  empagliflozin (JARDIANCE) 25 MG TABS tablet Take 1 tablet (25 mg total) by mouth daily. 08/20/22  Yes [name], MD  Furosemide (FUROSCIX) 80 MG/10ML CTKT Apply 1 time 06/09/23  Yes [name], Lisette Abu, MD  furosemide (LASIX) 40 MG tablet Take 1 tablet (40 mg total) by mouth 2 (two) times daily. Take an additional 20 mg once a day as needed for any sudden weight gain of 3 lbs overnight or 5 lbs over a week or any lower extremity swelling or shortness of breath. 06/21/23  Yes West, Katlyn D, NP  gabapentin (NEURONTIN) 300 MG capsule Take 600 mg by mouth at bedtime.   Yes [provider]  metFORMIN (GLUCOPHAGE) 1000 MG tablet Take 1,000 mg by mouth 2 times daily at 12 noon and 4 pm.   Yes [provider]  metFORMIN (GLUCOPHAGE-XR) 500 MG 24 hr tablet Take 1,000 mg by mouth 2 (two) times daily. 07/05/15  Yes [provider]  midodrine (PROAMATINE) 5 MG tablet Take 1 tablet (5 mg total) by mouth as needed. For systolic blood pressure  <95 04/27/23  Yes West, Holton D, NP  [facility] VERIO test strip 1 each daily. 04/27/23  Yes [provider]  polyethylene glycol (MIRALAX / GLYCOLAX) 17 g packet Take 17 g by mouth daily as needed for mild constipation. 04/21/23  Yes Johnson, Clanford L, MD  rosuvastatin (CRESTOR) 10 MG tablet Take 1 tablet (10 mg total) by mouth every other day. 10/22/22  Yes Duke, Roe Rutherford, PA  senna-docusate (SENOKOT-S) 8.6-50 MG tablet Take 1 tablet by mouth at bedtime as needed for mild constipation. 04/19/23  Yes Kathlen Mody, MD    Allergies as of 08/31/2023 - Review Complete 08/31/2023  Allergen Reaction Noted   Entresto [sacubitril-valsartan] Other (See Comments) 02/25/2017   Sacubitril Other (See Comments) 10/05/2017    Family History  Problem Relation Age of Onset   Cancer Mother    Lung disease Father        &  heart disease   Colon polyps Father    Colon cancer Sister    Sudden death Maternal Grandmother    Anesthesia problems Daughter     Social History   Socioeconomic History   Marital status: Widowed    Spouse name: Not on file   Number of children: 1   Years of education: Not on file   Highest education level: Not on file  Occupational History   Occupation: real Psychologist, occupational  Tobacco Use   Smoking status: Former    Types: Cigars    Quit date: 09/07/2011    Years since quitting: 11.9   Smokeless tobacco: Former    Quit date: 02/28/2012   Tobacco comments:    Former smoker 02/16/23  Vaping Use   Vaping status: Never Used  Substance and Sexual Activity   Alcohol use: Not Currently    Alcohol/week: 1.0 - 2.0 standard drink of alcohol    Types: 1 - 2 Cans of beer per week   Drug use: No   Sexual activity: Not on file  Other Topics Concern   Not on file  Social History Narrative   ** Merged History Encounter **       Social Drivers of Health   Financial Resource Strain: Not on file  Food Insecurity: No Food Insecurity (04/17/2023)   Hunger Vital  Sign    Worried About Running Out of Food in the Last Year: Never true    Ran Out of Food in the Last Year: Never true  Transportation Needs: No Transportation Needs (04/17/2023)   PRAPARE - Administrator, Civil Service (Medical): No    Lack of Transportation (Non-Medical): No  Physical Activity: Unknown (05/05/2023)   Received from [facility]   PCARE Exercise SDOH    Exercise: Active Lifestyle Only;Physical Therapy    PCare Exercise SDOH: Not on file    PCare Exercise SDOH: Not on file  Stress: Not on file  Social Connections: Not on file  Intimate Partner Violence: Not At Risk (04/17/2023)   Humiliation, Afraid, Rape, and Kick questionnaire    Fear of Current or Ex-Partner: No    Emotionally Abused: No    Physically Abused: No    Sexually Abused: No    Review of Systems: See HPI, otherwise negative ROS  Physical Exam: BP (!) 120/56 (BP Location: Left Arm, Patient Position: Sitting, Cuff Size: Normal)   Pulse 64   Temp 97.6 F (36.4 C) (Oral)   Ht 6\' 2"  (1.88 m)   Wt 210 lb 12.8 oz (95.6 kg)   SpO2 92%   BMI 27.07 kg/m  General:   Alert, frail pleasant and cooperative in NAD Lungs:  Clear throughout to auscultation.   No wheezes, crackles, or rhonchi. No acute distress. Heart:  Regular rate and rhythm; no murmurs, clicks, rubs,  or gallops. Abdomen: Non-distended, normal bowel sounds.  Soft and nontender without appreciable mass or hepatosplenomegaly.   Impression/Plan: Most pleasant 78 year old gentleman multiple comorbidities referred to me back in October with a positive Cologuard.  He underwent a colonoscopy which revealed multiple polyps.  A large rectosigmoid lesion underwent piecemeal removal and clipping.  Unfortunately, in spite of clipping, he developed post polypectomy bleed going back on Eliquis.  He was treated with additional clips even though the bleeding seems to have ceased when Dr. Levon Hedger went back and looked.  The pathology  report from this lesion demonstrated a small fragment of moderately differentiated invasive adenocarcinoma.  Surgical consultation  ensued. Discussions held between myself and Dr. [name].  Comorbidities significant.  Would likely be too risky to attempt a sigmoid resection.  It is possible that I got all of the lesion out.  General consensus would be either to follow clinically without doing a sigmoidoscopy or going back ,doing a sigmoidoscopy and reassessing the polypectomy site;   lengthy discussion with patient and daughter Gaspar Cola today.  It sounds me patient has had a relatively tough time after coming back with a post polypectomy bleed.  This is likely in part due to anemia.  He received 1 unit of packed RBCs during his brief follow-up hospitalization.  I think it would be entirely reasonable to go back and look at the polypectomy site to see if there is any residual residual tissue, plan to remove.  Again patient and family understand he would have to come off the Eliquis for this procedure and again, it was explained at length that bleeding from biopsy/polypectomy is a known risk of the procedure with periprocedure anticoagulation an additive risk factor.  After some discussion, patient and daughter wish to go ahead and look at the polypectomy site to assess for residual disease.  I feel this can be done safely.  He would need to come off the Eliquis for 2 days prior to the procedure.  He is going to see Dr. [name] the end of this week.  Would like to get his set feedback on his candidacy for a sigmoidoscopy under propofol.  The risks, benefits, limitations, alternatives and imponderables have been reviewed with the patient. Questions have been answered. All parties are agreeable.    Repeat H&H and CEA between now and procedure time.     Notice: This dictation was prepared with Dragon dictation along with smaller phrase technology. Any transcriptional errors that result from this process are  unintentional and may not be corrected upon review.

## 2023-08-31 NOTE — Telephone Encounter (Signed)
  Request for patient to stop medication prior to procedure or is needing cleareance  08/31/23  [name] [name] February 07, 1946  What type of surgery is being performed? flexible sigmoidoscopy  When is surgery scheduled? 09/22/23  What type of clearance is required (medical or pharmacy to hold medication or both? medication  Are there any medications that need to be held prior to surgery and how long? Eliquis x 2 days  Name of physician performing surgery?  Dr. Gavin Potters [facility] Phone: [phone] Fax: [phone]  Anethesia type (none, local, MAC, general)? MAC

## 2023-09-01 ENCOUNTER — Telehealth: Payer: Self-pay

## 2023-09-01 ENCOUNTER — Encounter: Payer: Medicare Other | Attending: Cardiology

## 2023-09-01 ENCOUNTER — Other Ambulatory Visit: Payer: Self-pay

## 2023-09-01 DIAGNOSIS — I5022 Chronic systolic (congestive) heart failure: Secondary | ICD-10-CM | POA: Insufficient documentation

## 2023-09-01 DIAGNOSIS — C187 Malignant neoplasm of sigmoid colon: Secondary | ICD-10-CM

## 2023-09-01 DIAGNOSIS — D509 Iron deficiency anemia, unspecified: Secondary | ICD-10-CM

## 2023-09-01 DIAGNOSIS — R195 Other fecal abnormalities: Secondary | ICD-10-CM

## 2023-09-01 DIAGNOSIS — Z95810 Presence of automatic (implantable) cardiac defibrillator: Secondary | ICD-10-CM | POA: Diagnosis not present

## 2023-09-01 DIAGNOSIS — R634 Abnormal weight loss: Secondary | ICD-10-CM

## 2023-09-01 DIAGNOSIS — D508 Other iron deficiency anemias: Secondary | ICD-10-CM | POA: Insufficient documentation

## 2023-09-01 NOTE — Telephone Encounter (Signed)
   Patient Name: ANTWON ROCHIN  DOB: Aug 02, 1945 MRN: [id number]  Primary Cardiologist: [name], MD  Clinical pharmacists have reviewed the patient's past medical history, labs, and current medications as part of preoperative protocol coverage. The following recommendations have been made:   Per office protocol, patient can hold Eliquis for 2 days prior to procedure.    I will route this recommendation to the requesting party via Epic fax function and remove from pre-op pool.  Please call with questions.  Napoleon Form, Leodis Rains, NP 09/01/2023, 10:53 AM

## 2023-09-01 NOTE — Progress Notes (Signed)
 EPIC Encounter for ICM Monitoring  Patient Name: [name] [name] is a 78 y.o. male Date: 09/01/2023 Primary Care Physican: [name], MD Primary Cardiologist: [facility]        Electrophysiologist: Camnitz Bi-V Pacing: 90.8%         09/21/2022 Weight: 213 lbs 10/06/2022 Weight: 219 lbs  11/17/2022 Weight: 212.5 lbs 12/22/2022 Weight: 211 lbs 01/13/2023 Weight: 209 lbs 05/14/2023 Office Weight: 204 lbs 12/31/024 Weight: 207.5 lbs 07/13/2023 Weight: 212 lbs 08/04/2023 Weight: 213 lbs   Clinical Status Since 03-Aug-2023 Time in AT/AF <0.1 hr/day (<0.1%) Longest AT/AF 2 minutes                                                         Spoke with patient and heart failure questions reviewed.  Transmission results reviewed.  Pt asymptomatic for fluid accumulation.  He vacationed in [place] for this past month.  He is feeling well.   Optivol thoracic impedance continues to suggest possible fluid accumulation since 11/14 and returning to normal 3/1.      Prescribed:  Furosemide 40 mg take 1 tablet(s) (40 mg total) by mouth twice a day.  Take an additional 20 mg once a day as needed for any sudden weight gain of 3 lbs overnight or 5 lbs in a week or any lower extremity swelling or SOB.     Labs: 06/18/2023 Creatinine 1.26, BUN 19, Potassium 4.4, Sodium 142, GFR 58, BNP 428.5 05/14/2023 Creatinine 1.30, BUN 20, Potassium 4.2, Sodium 142, GFR 56 05/05/2023 Creatinine 1.30, BUN 18, Potassium 4.3, Sodium 142, GFR 56, BNP 398.0 04/27/2023 Creatinine 1.12, BUN 14, Potassium 4.5, Sodium 141, GFR 67, BNP 571.5 04/20/2023 Creatinine 1.07, BUN 12, Potassium 3.5, Sodium 134, GFR >60  04/17/2023 Creatinine 0.92, BUN 20, Potassium 3.7, Sodium 136, GFR >60  04/09/2023 Creatinine 1.06, BUN 22, Potassium 3.5, Sodium 136, GFR >60  01/05/2023 Creatinine 1.27, BUN 22, Potassium 4.2, Sodium 142, GFR 58 A complete set of results can be found in Results Review.   Recommendations: No changes and encouraged to call if  experiencing any fluid symptoms.   Follow-up plan: ICM clinic phone appointment on 09/13/2023.   91 day device clinic remote transmission 11/01/2023.     EP/Cardiology Office Visits:  09/03/2023 with Dr [name].   09/28/2023 with Otilio Saber, PA.      Copy of ICM check sent to Dr. [name]   3 month ICM trend: 09/01/2023.    12-14 Month ICM trend:     Karie Soda, RN 09/01/2023 2:48 PM

## 2023-09-01 NOTE — Telephone Encounter (Signed)
-----   Message from Eula Listen sent at 08/31/2023  1:22 PM EST ----- Lets go ahead and repeat an H&H as well as a CEA between now and the time of colonoscopy.  This week would be okay.

## 2023-09-01 NOTE — Telephone Encounter (Signed)
 Labs were ordered. Lmom for pt to return call.

## 2023-09-01 NOTE — Telephone Encounter (Signed)
 Patient with diagnosis of Afib on Eliquis for anticoagulation.    Procedure: flexible sigmoidoscopy  Date of procedure: 09/22/23   CHA2DS2-VASc Score = 8   This indicates a 10.8% annual risk of stroke. The patient's score is based upon: CHF History: 1 HTN History: 1 Diabetes History: 1 Stroke History: 2 Vascular Disease History: 1 Age Score: 2 Gender Score: 0     CrCl 64 mL/min Platelet count 268 K    Per office protocol, patient can hold Eliquis for 2 days prior to procedure.     **This guidance is not considered finalized until pre-operative APP has relayed final recommendations.**

## 2023-09-01 NOTE — Telephone Encounter (Signed)
 Pt was made aware and verbalized understanding.

## 2023-09-02 ENCOUNTER — Ambulatory Visit: Payer: Medicare Other | Admitting: Neurology

## 2023-09-02 DIAGNOSIS — R195 Other fecal abnormalities: Secondary | ICD-10-CM | POA: Diagnosis not present

## 2023-09-02 DIAGNOSIS — R634 Abnormal weight loss: Secondary | ICD-10-CM | POA: Diagnosis not present

## 2023-09-02 DIAGNOSIS — C187 Malignant neoplasm of sigmoid colon: Secondary | ICD-10-CM | POA: Diagnosis not present

## 2023-09-02 DIAGNOSIS — D509 Iron deficiency anemia, unspecified: Secondary | ICD-10-CM | POA: Diagnosis not present

## 2023-09-03 ENCOUNTER — Ambulatory Visit: Payer: Medicare Other | Attending: Internal Medicine | Admitting: Internal Medicine

## 2023-09-03 VITALS — BP 108/48 | HR 65 | Ht 74.0 in | Wt 211.0 lb

## 2023-09-03 DIAGNOSIS — R5383 Other fatigue: Secondary | ICD-10-CM | POA: Insufficient documentation

## 2023-09-03 DIAGNOSIS — I255 Ischemic cardiomyopathy: Secondary | ICD-10-CM | POA: Diagnosis not present

## 2023-09-03 DIAGNOSIS — I10 Essential (primary) hypertension: Secondary | ICD-10-CM | POA: Diagnosis not present

## 2023-09-03 DIAGNOSIS — R531 Weakness: Secondary | ICD-10-CM | POA: Insufficient documentation

## 2023-09-03 LAB — HEMOGLOBIN AND HEMATOCRIT, BLOOD
Hematocrit: 36.3 % — ABNORMAL LOW (ref 37.5–51.0)
Hemoglobin: 10.8 g/dL — ABNORMAL LOW (ref 13.0–17.7)

## 2023-09-03 LAB — CEA: CEA: 1.4 ng/mL (ref 0.0–4.7)

## 2023-09-03 NOTE — Patient Instructions (Signed)
 Medication Instructions:  NO CHANGES *If you need a refill on your cardiac medications before your next appointment, please call your pharmacy*   Follow-Up: At [facility], you and your health needs are our priority.  As part of our continuing mission to provide you with exceptional heart care, we have created designated Provider Care Teams.  These Care Teams include your primary Cardiologist (physician) and Advanced Practice Providers (APPs -  Physician Assistants and Nurse Practitioners) who all work together to provide you with the care you need, when you need it.  We recommend signing up for the patient portal called "MyChart".  Sign up information is provided on this After Visit Summary.  MyChart is used to connect with patients for Virtual Visits (Telemedicine).  Patients are able to view lab/test results, encounter notes, upcoming appointments, etc.  Non-urgent messages can be sent to your provider as well.   To learn more about what you can do with MyChart, go to [email or web address].    Your next appointment:   3 months with Dr. [name] or [name] NP  Other Instructions  Dr. [name] has referred you to physical therapy       1st Floor: - Lobby - Registration  - Pharmacy  - Lab - Cafe  2nd Floor: - PV Lab - Diagnostic Testing (echo, CT, nuclear med)  3rd Floor: - Vacant  4th Floor: - TCTS (cardiothoracic surgery) - AFib Clinic - Structural Heart Clinic - Vascular Surgery  - Vascular Ultrasound  5th Floor: - [facility] (general and EP) - Clinical Pharmacy for coumadin, hypertension, lipid, weight-loss medications, and med management appointments    Valet parking services will be available as well.

## 2023-09-03 NOTE — Progress Notes (Signed)
 OFFICE NOTE  Chief Complaint:  Follow-up CHF  Primary Care Physician: [name], MD  HPI:  [name] [name] is a pleasant 78 year old gentleman previously followed by Dr. Alanda Amass. His past medical history is significant for coronary artery disease. In 2013 he underwent multivessel CABG for an ischemic cardiomyopathy. EF was 20-25% prior to surgery however post surgery his EF had improved up to 45-50%. Recently his EF was 40-45% by echo in May of 2014. There was mild mitral regurgitation, mild to moderately dilated left atrium and mild LVH. [name] [name] has been describing some anxiety. He also reports some pain in his legs however underwent Dopplers in September 2014 which showed preserved ABIs bilaterally a 1.0 on the right and 1.1 on the left. The bilateral peroneal and posterior tibial arteries were occluded. He reports some optimal control of his diabetes. His A1c was 7.2. He is concerned about the cost of taking Tradjenta. He is followed by Dr. Leslie Dales.   He was recently seen in the emergency room and [facility] hospital. There he presented with hypotension and was given 1 L normal saline and his symptoms improved. He had been having dizziness as well as some upper back pain into the left shoulder blade with exertion recently. The symptoms are worse particularly when climbing up ladders or going up stairs and are relieved at rest. After that hospitalization it was recommended that he discontinue his ACE inhibitor and beta blocker, and his blood pressure appears improved today up to 110/60.  [name] [name] returns today for followup of his stress test. This is interpreted as nonischemic with an EF of 44%. He reports still feeling very fatigued and extremely short of breath when doing activities. He says that the other day he tried to hit golf balls and felt that he was totally exhausted after just an hour. He reports his blood pressure has improved somewhat off of all medications however remains  low. He has had symptoms of orthostatic hypotension in the past. His symptoms do feel similar to prior to his bypass surgery. He also feels that he was much better after surgery than he is now and that his symptoms have been going on for the past 2 months.  At his last office visit, I recommended that [name] [name] have a repeat cardiac catheterization (08/2013). This demonstrated the following:  Impression:  1. 2 vessel native CAD with occluded LAD in the mid-vessel  2. Patent LIMA-LAD, SVG to PLA and SVG to PDA grafts with TIMI III flow  3. LVEDP = 13 mmHg  4. Random PVC's were noted  Based on these findings, I could not find any new cardiac cause of his symptoms. I suspect that some of his fatigue could be related to labile blood sugars. He says unexplained hypotension but is normotensive off of medications.  [name] [name] returns today for followup. He now reports feeling better since he had change in his medications. He is established with Dr. Sharl Ma who adjusted his diabetes medicines and stopped his Invokana. His shortness of breath and fatigue in both improved. He is now been expected some problems with left heel pain. He was found to have some spinal stenosis but has had improvement with inserts in his shoes.  I saw [name] [name] back in the office today. He has successfully underwent back surgery earlier this year which she says initially was much improved, however he says he subsequently developed more pain going down his legs. This sounds like it's neuropathic pain, but  is reluctant to try medication for it. He also significantly fatigue. This could be due to a number of etiologies. 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 Also, he is noted to have a history of obstructive sleep apnea. He was placed on BiPAP, but has not been compliant with that due to difficulty wearing the mask. He also never  had follow-up of his sleep study.  [name] [name] returns today for follow-up. He reports he's had worsening dyspnea and fatigue. He says that he can hardly sleep at night. He is struggling with significant anxiety. 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 He's not been wearing the BiPAP due to significant anxiety and difficulty sleeping. He feels like he gets smothered with his machine. Communication between Dr. Mayford Knife and Dr. Neita Carp led to the starting of Celexa as well as Klonopin to use as needed at night. He says that it does give him about 6 hours of sleep. He has not been using his BiPAP as instructed. He is reportedly had more worsening shortness of breath. He was seen in the ER for this and it was thought this was due [email or web address] with BiPAP. His BNP however is elevated over 300. Today in the office he says that he's had more swelling and more abdominal fullness as well as leg edema.  I had the pleasure seeing [name] [name] back today in the office. He seems to respond nicely to Lasix. I started him on 40 mg daily and while I was out of town my partner reviewed his lab work which included an elevated BNP over 300. He increased his Lasix to 40 mg twice a day. [name] [name] says he's had a significant improvement in his breathing. The BNP now is in the mid 200s. Renal function is stable based on labs today. Unfortunately, his echocardiogram does show a new cardiomyopathy with EF 30-35%. It should be noted that he felt "awful" on blood pressure/heart failure medicines and had taken himself off of ACE inhibitor and beta blocker in the past. The echo however does suggest inferior and anterolateral wall motion abnormalities which are new and could indicate graft dysfunction. I performed his last heart catheterization in 2015 which showed all 3 grafts patent.  [name] [name] returns today for follow-up of his left heart catheterization. I perform this a  few weeks ago and he had patent bypass grafts however LV function is reduced. Cardiac output is also reduced. Filling pressures were mildly elevated. He reports since discharge that he's had some improvement of his symptoms. He was started on low-dose beta blocker but he does report fatigue. When I asked him how they compared to previously he said he thinks attack sleep better since he started on the beta blocker but not back to what he thinks his baseline should be. He continues to have problems with left leg pain. He had ultrasound of the left leg which were unrevealing. He also had nerve conduction studies which I sent him for which were not suggestive of neuropathy. His symptoms were worse while lying flat on the Cath Lab table and I suspect this could be again coming from his back and he may need repeat orthopedic evaluation. His primary care provider started him on Celexa recently as well for anxiety.  12/23/2015  [name] [name] was seen back in the office today in follow-up.  Overall he seems to be doing well. He's tolerating low-dose carvedilol and has had some very infrequent morning dizziness. He was previously on Lasix 40 mg twice a day but ran out of the medicine about 8 days ago. He's not had any worsening weight gain or swelling nor worsening shortness of breath over the past week. This could be that he's had some improvement in his cardiomyopathy. Nevertheless, I would like him to be on some Lasix at least until we can determine whether or not he is euvolemic with lab work.  05/27/2016  [name] [name] returns today for follow-up. He's gained about 6 pounds of weight since I last saw him. He denies any worsening shortness of breath or orthostatic dizziness. He saw Dr. Mayford Knife in August for following of his obstructive sleep apnea. He remains physically active and denies any chest pain. He recently had a repeat echo in October 2017 which showed a small improvement in LV function with EF up to 35-40%. Heart  failure regimen includes aspirin, carvedilol, Crestor, and spironolactone. He is not currently on a ARB is he's had orthostatic symptoms in the past although that his generally resolved. His creatinine is normal.  08/24/2016  I saw [name] [name] today in follow-up. He again gained about 4 pounds over the past month. He felt that the weight gain was heart failure related because he got more short of breath particularly walking up stairs. Based on that he increased his Lasix back to 40 mg daily. He notes that his urine is been darker and his urination is not necessarily improved. He's also been more dizzy. He says he gets somewhat presyncopal with change in position. Lab work 12 days ago indicated a stable creatinine however. Despite this, I feel that he may be somewhat presyncopal perhaps on too high of a dose of diuretics. He is also on Entresto 24/26 and Aldactone 12.5 mg daily. He also complained of some pain across the back between the shoulder blades. He said this got better with resting and drinking water. It's difficult to say whether that it was the water or perhaps resting from his activities. I cannot exclude that this could be angina. His EKG however is reassuring today.  09/21/2016  [name] [name] was seen today in follow-up. I decreased his Lasix, however he does not feel any change in his dizziness. There is no evidence of presyncope. He has gained about 4 pounds which I suspect is some fluid retention. Blood pressure is well-controlled today 118/56.  02/26/2017  [name] [name] returns today for follow-up. He had more recent dizziness and it seemed to worsen on Entresto. He discontinued that and feels that he's now much better. Overall he says he feels well. He is not on ACE inhibitor or ARB but remains on carvedilol. Is also on Lasix 40 mg daily. Blood pressure is stable 122/52.  08/10/2017  [name] [name] returns for follow-up.  Overall he seems to be feeling very well.  Denies any chest pain or  worsening shortness of breath.  He gets occasional dizziness but that is much improved.  Of note an EKG was performed again today and there is a suggestion that there may be underlying atrial flutter.  Heart rate was 74 and EKG looks very similar to his prior EKG.  The computer has interpreted a sinus rhythm with first-degree AV block and bifascicular block.  I compared EKG to his previous EKG last August which looks similar however it is distinctly different from the EKG last of February.  He is asymptomatic, but not anticoagulated.  Otherwise, labs reviewed from October indicate total cholesterol 134, HDL 36 LDL 67 and triglycerides 157.  Hemoglobin A1c of 7.6 and serum creatinine 0.9.  08/30/2017  [name] [name] returns today for follow-up of his studies.  He underwent an echocardiogram to evaluate for change in LVEF, but more importantly to rule out atrial flutter.  His EKG was abnormal and suggested possible atrial flutter, however he was asymptomatic.  The echo did not show any evidence of atrial flutter.  LVEF was stable at 40%.  He also underwent a home 48-hour Holter monitor.  This demonstrated sinus rhythm with PACs and PVCs, but no evidence of atrial fibrillation or flutter.  Remains asymptomatic.  His only other complaint today is chronic leg pain.  He was seen by Dr. Allyson Sabal and evaluated with lower extremity arterial Dopplers.  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  A referral to neurosurgery was suggested but not placed.  01/24/2018  [name] [name] returns today for follow-up.  Overall he says he is doing really well.  He underwent 2 back injections and has had improvement in his back pain and leg pain.  He had an episode in June where he became somewhat dehydrated.  He presented to the ER with orthostatic hypotension.  His Lasix was cut back to 20 mg daily.  Weight is stayed stable effect is been improved from 252-246.  He says he is actually had enough energy  to play golf recently.  08/01/2018  [name] [name]. Calvert seen today in routine follow-up.  He has some occasional dizziness which is persistent.  He says he gets a little lightheaded when he urinates.  He has been off of tamsulosin with no specific benefit and now is taking the medication every third day.  Blood pressure is stable.  Weight is stable.  He denies any chest pain or worsening shortness of breath with exertion.  His last echo showed an EF of 40% which is stable if not slightly improved from an echo in 2017.  Unfortunately he could not tolerate Entresto and remains on carvedilol.  He endorses NYHA class I-II heart failure symptoms.  Recent lab work showed total cholesterol 151, HDL 39, LDL 79 triglycerides 166.  Hemoglobin A1c of 7.3.  03/30/2019  [name] [name]. Yearwood seen today in follow-up.  Overall he is doing well and has no complaints.  He has not recently struggled with any of the dizziness he has had previously.  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  In the past he has had EKG changes that were questionable for possible atrial flutter however monitoring as well as an echocardiogram failed to show any evidence of atrial flutter or fibrillation.  This is now clear finding of atrial fibrillation on EKG with an irregularly irregular rhythm with no evidence of P waves.  He reports being asymptomatic with this.  05/12/2019   [name] [name]. Katt returns today for follow-up of his A. fib.  He reports he has been compliant with Eliquis.  He says he had a couple episodes of loose stools.  He thought it might be due to the medicine but this seems quite atypical.  He stopped some of his stool softener/bulking agents.  Weight is down about 3 pounds.  He increased his Lasix because he felt like it was a little wheezy.  Possibly could have had some more heart failure because he is in A. fib and  does have a history of heart failure.  EKG again shows atrial  flutter/fibrillation with variable AV response at 74.  Since this is presumably persistent at this point we discussed an elective cardioversion and he is agreeable to this.  06/13/2019  [name] [name]. Commins was seen today in follow-up.  He underwent cardioversion by myself in November.  Since then he has felt fairly well.  His EKG today shows that he is maintaining sinus rhythm.  Blood pressure was 119/63 however he has had some positional dizziness at home.  His daughter notes he has had some blood pressures with low diastolics less than 50 and he does feel some dizziness.  He also feels some fatigue when exerting himself a lot.  I suspect this could be due to hypotension.  Is possible also that his EF has improved now that he is back in sinus and that he may be over diuresed being on both Lasix and Aldactone.  09/15/2019  RandallFundora returns for follow-up.  He reports improvement in his orthostatic symptoms after stopping Aldactone and his PCP also stopped tamsulosin.  He is now on some saw palmetto for BPH.  Unfortunately he does get a little bit more short of breath and I wonder if this is due to need for more diuretic.  His LVEF was 40% last year however has not been reassessed.  I would like to get an echo to recheck that.  03/25/2020  [name] [name]. Roker is seen today for 57-month follow-up.  He denies any further orthostatic symptoms.  He was not feeling well on Aldactone.  He was also taken off tamsulosin.  He is only on carvedilol for heart failure and unfortunately his LVEF declined further in March down to 5 to 40%, previously 40 to 45%.  I had increased his Lasix due to signs of increased LV filling pressure however then he became a little bit prerenal.  His Lasix was then decreased to 40 mg every morning and 20 mg every afternoon.  Since then he has been asymptomatic with heart failure at most with NYHA class II symptoms.  Is been struggling with issues with the CPAP and that the humidifier broke.  This is a  [employer] device and he was told by the company that they are no longer manufacturing them rather they are making ventilators because of COVID-19.  Lipid profile in June 2021 showed total cholesterol 168, HDL 32, triglycerides 106 and 116.  A1c was 7.7 and both remain above goal.  09/20/2020  [name] [name]. Kresse returns today for follow-up.  Overall he seems to be doing pretty well.  He struggled with a small wound under his left fifth metatarsal.  He is going to the wound center on April 1.  EF had declined somewhat down to 40 to 45%.  Unfortunately has been intolerant of Entresto and losartan more recently causing worsening fatigue.  His options for heart failure medications have been limited.  He is a diabetic with recent A1c at 6.8.  We discussed today the possibility of adding an SGLT2 inhibitor based on positive data in heart failure and diabetes.  01/01/2021  [name] [name]. Hyneman is seen today in follow-up.  He seems somewhat desponded today.  He says he has a lot going on and and unfortunately has been dealing with wound that is nonhealing in his left leg.  He said that even working with the wound center he has developed osteomyelitis.  There talk about possible amputation.  He has been noted to have some mild reduction  in blood flow to that leg.  He is scheduled to actually have a retrograde angiogram tomorrow with Dr. Chestine Spore.  He is also going to see Dr. Drue Second with infectious diseases.  Hopefully they will be able to get control of this before having to consider an amputation.  He does say that he feels better though.  He had come off of alfuzosin which she was taking by his urologist and he now reports being less dizzy.  Also his blood sugars are significantly lower after starting the Jardiance.  08/14/2021  [name] [name]. Dearmas returns today for follow-up.  He seems to be doing much better.  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  He still struggles with some balance issues but is done better.  He had 1 episode where he became dizzy or lightheaded in January when he was playing golf in [place].  This seemed to resolve with rest and drinking some water.  He has done well with the Jardiance.  11/17/2021  [name] [name]. Trompeter is seen today as an acute add-on for shortness of breath.  He reports several weeks of worsening shortness of breath and difficulty breathing.  This past weekend it was difficult for him to sleep.  He called and noted that he increased his Lasix from 40 to 60 mg daily.  I advised him to stay on that because of his meeting today.  He reports over the past couple days he has started to diurese a little more.  He is swelling has improved but his breathing is still somewhat labored and he is fatigued.  Blood pressure was normal today.  Oxygen at saturation 96%.  He had repeat lipids in March, which showed a total cholesterol 172, triglycerides 86, HDL 38 and LDL lower at 118.  EKG today shows normal sinus rhythm.  01/21/2022  [name] [name]. Schalk returns today for follow-up.  He is feeling much better after treatment of his heart failure.  He is down about 30 pounds of fluid.  He is currently on twice daily Lasix.  His echo had shown an LVEF of 30 to 35%.  Subsequently underwent heart catheterization by Dr. Excell Seltzer on 12/15/2021 which showed severe native coronary artery disease with patent left main and total occlusion of the LAD after the first diagonal.  The LIMA to LAD was patent.  There was interval occlusion of the SVG to PDA and SVG to PLA.  LVEDP was normal.  Given the fact that he had akinesis of the inferior and posterior walls and lateral walls on echo, the decision was made not to consider intervention of the native left circumflex.  We then pursued viability evaluation with cardiac MRI performed on 01/19/2022, this demonstrated severe LV dilatation with severe systolic dysfunction and LVEF  28%.  There was thinning and akinesis of the inferior and lateral walls.  The RV was mildly dilated with mild systolic dysfunction and RV EF 40%.  No LGE was noted however certain severe thinning of the apical and basal lateral wall and apical inferior wall suggested nonviability.  He was noted to have moderate mitral and tricuspid regurgitation.  08/20/2022  [name] [name]. Boehringer returns today for follow-up.  He underwent CRT-D implantation back in November with Dr. [name].  This was successful and subsequently he reported improvement in his heart failure symptoms consistent with possible responder status.  He was just seen in  follow-up a couple of weeks ago and was felt to be doing well.  He has had some recent weight gain.  Probably 7 to 10 pounds over the past 3 to 4 months.  He reports some lower extremity edema primarily in his ankles.  He says that he is felt much less dizzy after cutting back on his beta-blocker.  10/12/2022  [name] [name]. Huge returns today for follow-up.  About a month ago he called in with concerns about worsening shortness of breath.  He had a device interrogation which showed possible fluid accumulation as well as had noted that he had been out of rhythm since mid March.  He was advised to increase his diuretics which she did and has had improvement in his swelling.  He still feels fatigued.  EKG today shows underlying A-fib with a ventricular paced rhythm and PVCs.  He had repeat labs recently including a metabolic profile and BNP.  BNP is elevated at 323 but improved somewhat from 512 about a month ago.  Creatinine is normal but slightly higher at 1.13.  02/26/2023  [name] [name]. Wey is seen today in follow-up.  Since I last saw him he was diagnosed with atrial fibrillation and underwent cardioversion.  He had subsequent A-fib ablation by Dr. [name] in July.  Since then he has been doing very well.  He just had a repeat echo which showed mild improvement in LVEF up to 30 to 35%.  He had grade 3  diastolic dysfunction but has not had issues with heart failure symptoms.  Fact is lost 20 pounds since I saw him in April and according to his significant other has lost about 50 pounds over the past year.  He also says that his PCP had noted that he had an abnormal Cologuard test.  He was post to have colonoscopy but this was postponed due to his heart issues.  He is now considering that sooner.  09/03/2023  [name] [name]. Brisbin is seen today in follow-up.  He recently got back from [place] for the appointment today.  He is scheduled to have an upcoming sigmoidoscopy with Dr. [name].  He is a little concerned because of bleeding issues he had previously.  He reported that he has had some shortness of breath and still gets weakness and fatigue with exertion.  His shortness of breath is actually been improving a little over the last several days.  He had a recent device interrogation which did show a trend toward fluid accumulation at the end of February but seems to be trending in the right direction now.  When I saw him a few months ago he was given Furoscix for an acute exacerbation of congestive heart failure.  He had several doses and noted marked improvement in his symptoms.  So far his weight is stayed fairly stable.  Main concern is dizziness and difficulty ambulating.  PMHx:  Past Medical History:  Diagnosis Date   AICD (automatic cardioverter/defibrillator) present    Arthritis    Knee L - probably   Atrial fibrillation (HCC)    BPH (benign prostatic hyperplasia)    CAD (coronary artery disease) 07/06/2011   CHF (congestive heart failure) (HCC)    Coronary artery disease    Diabetes mellitus    Frequency of urination    Heart failure (HCC)    High cholesterol    History of nuclear stress test 09/2010   dipyridamole; mild perfusion defect due to attenuation with mild superimposed ischemia in apical septal, apical, apical inferior &  apical lateral regions; rest LV enlarged in size; prominent gut  uptake in infero-apical region; no significant ischemia demonstrated; low risk scan    HNP (herniated nucleus pulposus), lumbar    Hypertension    Ischemic cardiomyopathy    LV dysfunction 07/06/2011   Nocturia    Right bundle branch block    S/P CABG (coronary artery bypass graft) 08/03/2011   x3; LIMA to LAD,, SVG to PDA, SVG to posterolateral branch of RCA; Dr. Wayland [place]   Sleep apnea    sleep study 10/2010- AHI during total sleep 32.1/hr and during REM 62.3/hr (severe sleep apnea)unable to tolerate c pap   Spinal stenosis of lumbar region    Stroke [facility])    [place]     Past Surgical History:  Procedure Laterality Date   ABDOMINAL AORTOGRAM W/LOWER EXTREMITY Left 11/14/2020   Procedure: ABDOMINAL AORTOGRAM W/LOWER EXTREMITY;  Surgeon: Cephus Shelling, MD;  Location: [facility] INVASIVE CV LAB;  Service: Cardiovascular;  Laterality: Left;   ABDOMINAL AORTOGRAM W/LOWER EXTREMITY N/A 01/02/2021   Procedure: ABDOMINAL AORTOGRAM W/LOWER EXTREMITY;  Surgeon: Cephus Shelling, MD;  [facility] INVAS[facility] CV LAB;  Service: Cardiovascular;  Laterality: N/A;   AMPUTATION Left 03/20/2021   Procedure: Left foot 5th ray amputation;  Surgeon: Toni Arthurs, MD;  Location: [facility] OR;  Service: Orthopedics;  Laterality: Left;   ATRIAL FIBRILLATION ABLATION N/A 01/19/2023   Procedure: ATRIAL FIBRILLATION ABLATION;  Surgeon: [name], MD;  Location: [facility] INVASIVE CV LAB;  Service: Cardiovascular;  Laterality: N/A;   BIOPSY  04/14/2023   Procedure: BIOPSY;  Surgeon: Corbin Ade, MD;  [facility]: [facility] ENDO SU[facility];  Service: Endoscopy;;   BIV ICD INSERTION CRT-D N/A 04/30/2022   Procedure: BIV ICD INSERTION CRT-D;  Surgeon: [name], MD;  Location: [facility] INVASIVE CV LAB;  Service: Cardiovascular;  Laterality: N/A;   CARDIAC CATHETERIZATION  01/2011   ischemic cardiomyopathy 30-35%, multivessel CAD (Dr. Bishop Limbo)    CARDIAC CATHETERIZATION  09/13/2015   Procedure: Right/Left Heart Cath and  Coronary/Graft Angiography;  Surgeon: [name], MD;  Location: [facility] INVASIVE CV LAB;  Service: Cardiovascular;;   CARDIOVERSION N/A 05/16/2019   Procedure: CARDIOVERSION;  Surgeon: [name], MD;  Location: [facility] ENDOSCOPY;  Service: Cardiovascular;  Laterality: N/A;   CARDIOVERSION N/A 10/16/2022   Procedure: CARDIOVERSION;  Surgeon: Thurmon Fair, MD;  [facility]: [facility] INVASIVE CV LAB;  Service: Cardiovascular;  Laterality: N/A;   CARDIOVERSION N/A 12/16/2022   Procedure: CARDIOVERSION;  Surgeon: Little Ishikawa, MD;  Location: [facility] INVASIVE CV LAB;  Service: Cardiovascular;  Laterality: N/A;   COLONOSCOPY WITH PROPOFOL N/A 04/14/2023   Procedure: COLONOSCOPY WITH PROPOFOL;  Surgeon: Corbin Ade, MD;  [facility];  Service: Endoscopy;  Laterality: N/A;  11:00am, asa 3/4   COLONOSCOPY WITH PROPOFOL N/A 04/18/2023   Procedure: COLONOSCOPY WITH PROPOFOL;  Surgeon: Dolores Frame, MD;  [facility];  Service: Gastroenterology;  Laterality: N/A;   Colonscopy     CORONARY ARTERY BYPASS GRAFT  08/03/2011   Procedure: CORONARY ARTERY BYPASS GRAFTING (CABG);  Surgeon: Alleen Borne, MD;  Location: [facility] OR;  Service: Open Heart Surgery;  Laterality: N/A;  CABG times three using right saphenous vein and left mammary artery usisng endoscope.   ESOPHAGEAL BRUSHING  04/14/2023   Procedure: ESOPHAGEAL BRUSHING;  Surgeon: Corbin Ade, MD;  [facility];  Service: Endoscopy;;   ESOPHAGOGASTRODUODENOSCOPY (EGD) WITH PROPOFOL N/A 04/14/2023   Procedure: ESOPHAGOGASTRODUODENOSCOPY (EGD) WITH PROPOFOL;  Surgeon: Corbin Ade, MD;  [facility];  Service: Endoscopy;  Laterality: N/A;   HEMOSTASIS CLIP PLACEMENT  04/14/2023   Procedure: HEMOSTASIS CLIP PLACEMENT;  Surgeon: Corbin Ade, MD;  [facility];  Service: Endoscopy;;   LEFT HEART CATH AND CORS/GRAFTS ANGIOGRAPHY N/A 12/15/2021   Procedure: LEFT HEART CATH AND  CORS/GRAFTS ANGIOGRAPHY;  Surgeon: Tonny Bollman, MD;  Location: [facility] INVASIVE CV LAB;  Service: Cardiovascular;  Laterality: N/A;   LEFT HEART CATHETERIZATION WITH CORONARY/GRAFT ANGIOGRAM N/A 09/20/2013   Procedure: LEFT HEART CATHETERIZATION WITH Isabel Caprice;  Surgeon: [name], MD;  Location: [facility] CATH LAB;  Service: Cardiovascular;  Laterality: N/A;   Lower Extremity Arterial Doppler  03/16/2013   bilat ABIs demonstated normal values; R runoff - posterior tibial & anterior tibial arteries occluded; L runoff - peroneal & posterior tibial arteries occluded, anterior tibial artery appears occluded   LUMBAR LAMINECTOMY/DECOMPRESSION MICRODISCECTOMY N/A 09/12/2014   Procedure: LUMBAR DECOMPRESSION L3-L4, L4-L5, MICRODISCECTOMY L4-L5;  Surgeon: Jene Every, MD;  Location: [facility] ORS;  Service: Orthopedics;  Laterality: N/A;   PERIPHERAL VASCULAR BALLOON ANGIOPLASTY  11/14/2020   Procedure: PERIPHERAL VASCULAR BALLOON ANGIOPLASTY;  Surgeon: Cephus Shelling, MD;  [facility] INV[facility] CV LAB;  Service: Cardiovascular;;   PERIPHERAL VASCULAR BALLOON ANGIOPLASTY Left 01/02/2021   Procedure: PERIPHERAL VASCULAR BALLOON ANGIOPLASTY;  Surgeon: Cephus Shelling, MD;  [facility] INVASIVE CV LAB;  Service: Cardiovascular;  Laterality: Left;  Failed PTA   Pilonidal Cyst removed     POLYPECTOMY  04/14/2023   Procedure: POLYPECTOMY INTESTINAL;  Surgeon: Corbin Ade, MD;  [facility];  Service: Endoscopy;;   SCLEROTHERAPY  04/14/2023   Procedure: [name];  Surgeon: Corbin Ade, MD;  [facility];  Service: Endoscopy;;   SUBMUCOSAL LIFTING INJECTION  04/14/2023   Procedure: SUBMUCOSAL LIFTING INJECTION;  Surgeon: Corbin Ade, MD;  [facility];  Service: Endoscopy;;   SUBMUCOSAL TATTOO INJECTION  04/14/2023   Procedure: SUBMUCOSAL TATTOO INJECTION;  Surgeon: Corbin Ade, MD;  [facility];  Service: Endoscopy;;    TRANSTHORACIC ECHOCARDIOGRAM  11/10/2012   EF 40-45%, mild LVH, mild hypokinesis of anteroseptal myocardium, grade 1 diastolic dysfunction; mild [name] [name] & calcifed mitral annulus; LA mild-mod dilated; RA mildly dilated    FAMHx:  Family History  Problem Relation Age of Onset   Cancer Mother    Lung disease Father        & heart disease   Colon polyps Father    Colon cancer Sister    Sudden death Maternal Grandmother    Anesthesia problems Daughter     SOCHx:   reports that he quit smoking about 11 years ago. His smoking use included cigars. He quit smokeless tobacco use about 11 years ago. He reports that he does not currently use alcohol after a past usage of about 1.0 - 2.0 standard drink of alcohol per week. He reports that he does not use drugs.  ALLERGIES:  Allergies  Allergen Reactions   Entresto [Sacubitril-Valsartan] Other (See Comments)    dizziness   Sacubitril Other (See Comments)     Dizziness    ROS: Pertinent items noted in HPI and remainder of comprehensive ROS otherwise negative.  HOME MEDS: Current Outpatient Medications  Medication Sig Dispense Refill   acetaminophen (TYLENOL) 500 MG tablet Take 500-1,000 mg by mouth every 6 (six) hours as needed (for pain.).     apixaban (ELIQUIS) 5 MG TABS tablet Take  1 tablet (5 mg total) by mouth 2 (two) times daily.     carvedilol (COREG) 3.125 MG tablet Take 1 tablet (3.125 mg total) by mouth 2 (two) times daily. 60 tablet 1   empagliflozin (JARDIANCE) 25 MG TABS tablet Take 1 tablet (25 mg total) by mouth daily. 90 tablet 3   Furosemide (FUROSCIX) 80 MG/10ML CTKT Apply 1 time     furosemide (LASIX) 40 MG tablet Take 1 tablet (40 mg total) by mouth 2 (two) times daily. Take an additional 20 mg once a day as needed for any sudden weight gain of 3 lbs overnight or 5 lbs over a week or any lower extremity swelling or shortness of breath. 90 tablet 3   gabapentin (NEURONTIN) 300 MG capsule Take 600 mg by mouth at bedtime.      metFORMIN (GLUCOPHAGE-XR) 500 MG 24 hr tablet Take 1,000 mg by mouth 2 (two) times daily.     midodrine (PROAMATINE) 5 MG tablet Take 1 tablet (5 mg total) by mouth as needed. For systolic blood pressure <95 30 tablet 2   ONETOUCH VERIO test strip 1 each daily.     polyethylene glycol (MIRALAX / GLYCOLAX) 17 g packet Take 17 g by mouth daily as needed for mild constipation.     rosuvastatin (CRESTOR) 10 MG tablet Take 1 tablet (10 mg total) by mouth every other day. 50 tablet 3   senna-docusate (SENOKOT-S) 8.6-50 MG tablet Take 1 tablet by mouth at bedtime as needed for mild constipation.     metFORMIN (GLUCOPHAGE) 1000 MG tablet Take 1,000 mg by mouth 2 times daily at 12 noon and 4 pm. (Patient not taking: Reported on 09/03/2023)     No current facility-administered medications for this visit.    LABS/IMAGING: Results for orders placed or performed in visit on 09/01/23 (from the past 48 hours)  CEA     Status: None   Collection Time: 09/02/23  1:46 PM  Result Value Ref Range   CEA 1.4 0.0 - 4.7 ng/mL    Comment:                              Nonsmokers          <3.9                              Smokers             <5.6 Roche [employer] Electrochemiluminescence Immunoassay (ECLIA) Values obtained with different assay methods or kits cannot be used interchangeably.  Results cannot be interpreted as absolute evidence of the presence or absence of malignant disease.   Hemoglobin and hematocrit, blood     Status: Abnormal   Collection Time: 09/02/23  1:46 PM  Result Value Ref Range   Hemoglobin 10.8 (L) 13.0 - 17.7 g/dL   Hematocrit 16.1 (L) 09.6 - 51.0 %    No results found.  VITALS: BP (!) 108/48 (BP Location: Right Arm, Patient Position: Sitting, Cuff Size: Normal)   Pulse 65   Ht 6\' 2"  (1.88 m)   Wt 211 lb (95.7 kg)   SpO2 99%   BMI 27.09 kg/m   EXAM: General appearance: alert and no distress Neck: JVD - 5 cm above sternal notch, no carotid bruit, and thyroid not enlarged,  symmetric, no tenderness/mass/nodules Lungs: dullness to percussion bibasilar Heart: regular rate and rhythm Abdomen: soft, non-tender; bowel  sounds normal; no masses,  no organomegaly Extremities: edema trace bilateral sock line Pulses: 2+ and symmetric Skin: Skin color, texture, turgor normal. No rashes or lesions Neurologic: Grossly normal Psych: Pleasant  EKG: EKG Interpretation Date/Time:  Friday September 03 2023 10:39:19 EST Ventricular Rate:  65 PR Interval:  176 QRS Duration:  126 QT Interval:  490 QTC Calculation: 509 R Axis:   229  Text Interpretation: Atrial-sensed ventricular-paced rhythm with frequent Premature ventricular complexes Biventricular pacemaker detected When compared with ECG of 27-Apr-2023 14:49, Premature ventricular complexes are now Present Vent. rate has decreased BY   5 BPM Confirmed by Zoila Shutter [phone]) on 09/03/2023 10:46:34 AM    ASSESSMENT: Chronic systolic congestive heart failure, LVEF 28% by cMRI (12/2021) -inferior and lateral wall thinning suggestive of nonviable myocardium, NYHA Class I-II symptoms, s/p Medtronic CRT-D implant (04/2022) Orthostatic hypotension-resolved History of atrial fibrillation, CHADSVASC score of 5, status post DCCV (04/2019), 11/2022 and subsequent ablation (12/2022) Chronic systolic congestive heart failure - EF 40-45%% (2021) -> 30-35% (2024) Coronary artery disease status post three-vessel CABG in 2013 (LIMA to LAD, SVG to PDA and SVG to PLA) -occluded SVG to PDA and SVG to PLA by cath 11/2021 Hypertension-controlled Dyslipidemia Diabetes type 2 - better controlled Mild PVD-with normal ABI Hypotension/upper back pain - resolved Spinal stenosis - s/p surgery Trifascicular block OSA-back on BiPAP  PLAN: 1.   [name] [name]. Tory seems to be stable from a heart failure standpoint.  Balance is an issue as well as I think lower extremity strength.  He might benefit from rehabilitation.  Will put in a referral for that with  physical therapy.  He is current regimen with sliding scale Lasix I think is reasonable.  OptiVol showed some trend towards normal recently.  From a cardiac standpoint he be okay to proceed with upcoming colonoscopy after holding his Eliquis at least 2 days prior to the procedure.  He continues to have issues with anemia.  His hemoglobin is in the 10-11 range and has a history of low iron but is not able to take supplements.  He would likely benefit from IV iron.  I will defer to his primary care provider regarding this.  This may continue to contribute to his fatigue.  Will arrange for follow-up in about 3 months or sooner if necessary.  [name], MD, [facility] [place], FACP  [place]  [facility]  Medical Director of the Advanced Lipid Disorders &  Cardiovascular Risk Reduction Clinic Diplomate of the American Board of Clinical Lipidology Attending Cardiologist  Direct Dial: [phone]  Fax: [phone]  Website:  [email or web address] Nicely Niveah Boerner 09/03/2023, 10:46 AM

## 2023-09-06 ENCOUNTER — Other Ambulatory Visit: Payer: Self-pay | Admitting: *Deleted

## 2023-09-06 DIAGNOSIS — D509 Iron deficiency anemia, unspecified: Secondary | ICD-10-CM

## 2023-09-06 NOTE — Telephone Encounter (Signed)
 Pt seen Dr.[name] on 09/03/23. Ok was given for pt to hold Eliquis. Dr.Rourk has been made aware

## 2023-09-09 NOTE — Progress Notes (Signed)
 Remote ICD transmission.

## 2023-09-09 NOTE — Addendum Note (Signed)
 Addended by: Geralyn Flash D on: 09/09/2023 10:28 AM   Modules accepted: Orders

## 2023-09-10 ENCOUNTER — Other Ambulatory Visit: Payer: Self-pay | Admitting: Gastroenterology

## 2023-09-10 ENCOUNTER — Telehealth: Payer: Self-pay | Admitting: Gastroenterology

## 2023-09-10 ENCOUNTER — Other Ambulatory Visit: Payer: Self-pay

## 2023-09-10 NOTE — Telephone Encounter (Signed)
 I was contacted regarding trying to facilitate iron infusion for this patient.  Dr. [name] would like patient to receive an iron infusion and was under the understanding that potentially could have been done at the cancer center however given this needs to be completed at the [facility] infusion center I have placed orders today.  I spoke with Burke Keels at [facility] infusion center today who reported that patient's daughter was upset about her father, the patient ([name] [name]) not yet being scheduled for an iron infusion and was concerned about this needing to be urgent.  His last iron labs were in November and his iron was low however his most recent hemoglobin last week was stable at 10.8.  Orders for iron infusion, Monoferric (1 time iron infusion) was placed to [facility] infusion center and they should be contacting the patient and/or his daughter next week.  I attempted to call the daughter [name] [name]) to notify her of this however call went straight to voicemail.  According to [facility], detailed voice message is allowed therefore I left a voicemail containing information about the above situation and advised that she had any further questions she can contact the office.  Brooke Bonito, MSN, APRN, FNP-BC, AGACNP-BC [facility]

## 2023-09-13 ENCOUNTER — Telehealth: Payer: Self-pay

## 2023-09-13 ENCOUNTER — Other Ambulatory Visit: Payer: Self-pay | Admitting: Gastroenterology

## 2023-09-13 ENCOUNTER — Encounter

## 2023-09-13 NOTE — Telephone Encounter (Signed)
 Hello,  Auth Submission: DENIED Site of care: Site of care: [facility] Payer: medicare a/b bcbs supp Medication & CPT/J Code(s) submitted: Monoferric (Ferrci derisomaltose) [phone] Route of submission (phone, fax, portal):  Phone # Fax # Auth type: Buy/Bill PB Units/visits requested: 1000mg , 1 dose Reference number:    Authorization has been DENIED because Feraheme is the preferred medicine. Would you like to switch? Thanks

## 2023-09-13 NOTE — Telephone Encounter (Signed)
 This has been handled by Brooke Bonito NP, see other telephone note.

## 2023-09-13 NOTE — Telephone Encounter (Signed)
 Noted.

## 2023-09-13 NOTE — Telephone Encounter (Signed)
-----   Message from [facility] Mindy E sent at 09/09/2023  2:24 PM EDT ----- Per hematology/oncology, since he is only need 1 dose of IV Iron this will need to be ordered through infusion clinic. Hem/onc only give IV iron to patients they are taking over care for IDA. ----- Message ----- From: Corbin Ade, MD Sent: 09/05/2023   1:39 PM EDT To: Mindy S Estudillo, CMA; Gatha Mayer, CMA   Dr. [name] recommends a dose of IV iron.  I agree.  This will probably help him feel much better.  Can we go ahead and order or get him to the fourth floor at [facility] and get them just do it.  Can be before after his upcoming flexible sigmoidoscopy

## 2023-09-13 NOTE — Telephone Encounter (Signed)
Yes please, Thank you !

## 2023-09-13 NOTE — Telephone Encounter (Signed)
 Hello,   Pt will be scheduled as soon as possible.  Auth Submission: NO AUTH NEEDED Site of care: Site of care: [facility] Payer: medicare a/b, bcbs supp  Medication & CPT/J Code(s) submitted: Feraheme (ferumoxytol) [id number] Route of submission (phone, fax, portal): portal Phone # Fax # Auth type: Buy/Bill HB Units/visits requested: 510mg , 2 doses Reference number:  Approval from: 09/13/23 to 02/27/24

## 2023-09-14 DIAGNOSIS — R5381 Other malaise: Secondary | ICD-10-CM | POA: Diagnosis not present

## 2023-09-14 DIAGNOSIS — E782 Mixed hyperlipidemia: Secondary | ICD-10-CM | POA: Diagnosis not present

## 2023-09-14 DIAGNOSIS — Z6826 Body mass index (BMI) 26.0-26.9, adult: Secondary | ICD-10-CM | POA: Diagnosis not present

## 2023-09-14 DIAGNOSIS — D509 Iron deficiency anemia, unspecified: Secondary | ICD-10-CM | POA: Diagnosis not present

## 2023-09-14 DIAGNOSIS — H9192 Unspecified hearing loss, left ear: Secondary | ICD-10-CM | POA: Diagnosis not present

## 2023-09-15 ENCOUNTER — Encounter: Admitting: Internal Medicine

## 2023-09-15 VITALS — BP 108/54 | HR 64 | Temp 97.3°F | Resp 16

## 2023-09-15 DIAGNOSIS — I5022 Chronic systolic (congestive) heart failure: Secondary | ICD-10-CM | POA: Diagnosis not present

## 2023-09-15 DIAGNOSIS — D508 Other iron deficiency anemias: Secondary | ICD-10-CM

## 2023-09-15 DIAGNOSIS — C187 Malignant neoplasm of sigmoid colon: Secondary | ICD-10-CM

## 2023-09-15 DIAGNOSIS — Z95810 Presence of automatic (implantable) cardiac defibrillator: Secondary | ICD-10-CM | POA: Diagnosis not present

## 2023-09-15 MED ORDER — SODIUM CHLORIDE 0.9 % IV SOLN
510.0000 mg | Freq: Once | INTRAVENOUS | Status: AC
  Administered 2023-09-15: 510 mg via INTRAVENOUS
  Filled 2023-09-15: qty 17

## 2023-09-15 MED ORDER — ACETAMINOPHEN 325 MG PO TABS
650.0000 mg | ORAL_TABLET | Freq: Once | ORAL | Status: AC
  Administered 2023-09-15: 650 mg via ORAL

## 2023-09-15 MED ORDER — DIPHENHYDRAMINE HCL 25 MG PO CAPS
25.0000 mg | ORAL_CAPSULE | Freq: Once | ORAL | Status: AC
  Administered 2023-09-15: 25 mg via ORAL

## 2023-09-15 NOTE — Progress Notes (Signed)
 Diagnosis: Iron Deficiency Anemia  Provider:  Brooke Bonito NP  Procedure: IV Infusion  IV Type: Peripheral, IV Location: R Antecubital  Feraheme (Ferumoxytol), Dose: 510 mg  Infusion Start Time: 1350  Infusion Stop Time: 1405  Post Infusion IV Care: Observation period completed  Discharge: Condition: Good, Destination: Home . AVS Provided  Performed by:  Cleotilde Neer, LPN

## 2023-09-16 DIAGNOSIS — R5383 Other fatigue: Secondary | ICD-10-CM | POA: Diagnosis not present

## 2023-09-16 DIAGNOSIS — R531 Weakness: Secondary | ICD-10-CM | POA: Diagnosis not present

## 2023-09-16 NOTE — Patient Instructions (Signed)
   Your procedure is scheduled on: 09/22/2023  Report to [facility] Main Entrance at 9:30    AM.  Call this number if you have problems the morning of surgery: [phone]   Remember:  Hold Jardiance for 3 days  last dose 09/18/2023   Hold Eliquis 2 days prior to procedure 09/19/2023              Follow Directions on the letter you received from Your Physician's office regarding the Bowel Prep              No Smoking the day of Procedure :   Take these medicines the morning of surgery with A SIP OF WATER: carvedilol   Do not wear jewelry, make-up or nail polish.    Do not bring valuables to the hospital.  Contacts, dentures or bridgework may not be worn into surgery.  .   Patients discharged the day of surgery will not be allowed to drive home.     Sigmoidoscopy, Adult, Care After This sheet gives you information about how to care for yourself after your procedure. Your health care provider may also give you more specific instructions. If you have problems or questions, contact your health care provider. What can I expect after the procedure? After the procedure, it is common to have: A small amount of blood in your stool for 24 hours after the procedure. Some gas. Mild abdominal cramping or bloating.  Follow these instructions at home: General instructions  For the first 24 hours after the procedure: Do not drive or use machinery. Do not sign important documents. Do not drink alcohol. Do your regular daily activities at a slower pace than normal. Eat soft, easy-to-digest foods. Rest often. Take over-the-counter or prescription medicines only as told by your health care provider. It is up to you to get the results of your procedure. Ask your health care provider, or the department performing the procedure, when your results will be ready. Relieving cramping and bloating Try walking around when you have cramps or feel bloated. Apply heat to your abdomen as told by your  health care provider. Use a heat source that your health care provider recommends, such as a moist heat pack or a heating pad. Place a towel between your skin and the heat source. Leave the heat on for 20-30 minutes. Remove the heat if your skin turns bright red. This is especially important if you are unable to feel pain, heat, or cold. You may have a greater risk of getting burned. Eating and drinking Drink enough fluid to keep your urine clear or pale yellow. Resume your normal diet as instructed by your health care provider. Avoid heavy or fried foods that are hard to digest. Avoid drinking alcohol for as long as instructed by your health care provider. Contact a health care provider if: You have blood in your stool 2-3 days after the procedure. Get help right away if: You have more than a small spotting of blood in your stool. You pass large blood clots in your stool. Your abdomen is swollen. You have nausea or vomiting. You have a fever. You have increasing abdominal pain that is not relieved with medicine. This information is not intended to replace advice given to you by your health care provider. Make sure you discuss any questions you have with your health care provider. Document Released: 01/28/2004 Document Revised: 03/09/2016 Document Reviewed: 08/27/2015 Elsevier Interactive Patient Education  [employer].

## 2023-09-17 ENCOUNTER — Encounter: Payer: Self-pay | Admitting: Gastroenterology

## 2023-09-20 ENCOUNTER — Other Ambulatory Visit: Payer: Self-pay

## 2023-09-20 ENCOUNTER — Encounter (HOSPITAL_COMMUNITY)
Admission: RE | Admit: 2023-09-20 | Discharge: 2023-09-20 | Disposition: A | Discharge status: Home / Self Care | Source: Ambulatory Visit | Attending: Internal Medicine | Admitting: Internal Medicine

## 2023-09-20 ENCOUNTER — Encounter: Admitting: Emergency Medicine

## 2023-09-20 ENCOUNTER — Encounter (HOSPITAL_COMMUNITY): Payer: Self-pay | Admitting: Anesthesiology

## 2023-09-20 ENCOUNTER — Encounter (HOSPITAL_COMMUNITY): Payer: Self-pay

## 2023-09-20 VITALS — BP 108/54 | HR 65 | Temp 97.3°F | Resp 18 | Ht 74.0 in | Wt 211.0 lb

## 2023-09-20 VITALS — BP 103/58 | HR 61 | Resp 18

## 2023-09-20 DIAGNOSIS — C187 Malignant neoplasm of sigmoid colon: Secondary | ICD-10-CM

## 2023-09-20 DIAGNOSIS — Z01818 Encounter for other preprocedural examination: Secondary | ICD-10-CM | POA: Insufficient documentation

## 2023-09-20 DIAGNOSIS — D508 Other iron deficiency anemias: Secondary | ICD-10-CM | POA: Insufficient documentation

## 2023-09-20 DIAGNOSIS — I493 Ventricular premature depolarization: Secondary | ICD-10-CM | POA: Insufficient documentation

## 2023-09-20 DIAGNOSIS — I5022 Chronic systolic (congestive) heart failure: Secondary | ICD-10-CM | POA: Diagnosis not present

## 2023-09-20 DIAGNOSIS — Z95810 Presence of automatic (implantable) cardiac defibrillator: Secondary | ICD-10-CM | POA: Diagnosis not present

## 2023-09-20 DIAGNOSIS — I255 Ischemic cardiomyopathy: Secondary | ICD-10-CM | POA: Diagnosis not present

## 2023-09-20 DIAGNOSIS — Z79899 Other long term (current) drug therapy: Secondary | ICD-10-CM | POA: Diagnosis not present

## 2023-09-20 LAB — CBC WITH DIFFERENTIAL/PLATELET
Abs Immature Granulocytes: 0.02 10*3/uL (ref 0.00–0.07)
Basophils Absolute: 0 10*3/uL (ref 0.0–0.1)
Basophils Relative: 1 %
Eosinophils Absolute: 0.2 10*3/uL (ref 0.0–0.5)
Eosinophils Relative: 5 %
HCT: 35.1 % — ABNORMAL LOW (ref 39.0–52.0)
Hemoglobin: 10.9 g/dL — ABNORMAL LOW (ref 13.0–17.0)
Immature Granulocytes: 0 %
Lymphocytes Relative: 20 %
Lymphs Abs: 0.9 10*3/uL (ref 0.7–4.0)
MCH: 23.9 pg — ABNORMAL LOW (ref 26.0–34.0)
MCHC: 31.1 g/dL (ref 30.0–36.0)
MCV: 77 fL — ABNORMAL LOW (ref 80.0–100.0)
Monocytes Absolute: 0.5 10*3/uL (ref 0.1–1.0)
Monocytes Relative: 11 %
Neutro Abs: 2.9 10*3/uL (ref 1.7–7.7)
Neutrophils Relative %: 63 %
Platelets: 203 10*3/uL (ref 150–400)
RBC: 4.56 MIL/uL (ref 4.22–5.81)
RDW: 24.1 % — ABNORMAL HIGH (ref 11.5–15.5)
WBC: 4.6 10*3/uL (ref 4.0–10.5)
nRBC: 0 % (ref 0.0–0.2)

## 2023-09-20 LAB — BASIC METABOLIC PANEL
Anion gap: 11 (ref 5–15)
BUN: 18 mg/dL (ref 8–23)
CO2: 24 mmol/L (ref 22–32)
Calcium: 8.6 mg/dL — ABNORMAL LOW (ref 8.9–10.3)
Chloride: 102 mmol/L (ref 98–111)
Creatinine, Ser: 1.17 mg/dL (ref 0.61–1.24)
GFR, Estimated: >60 mL/min (ref >60)
Glucose, Bld: 170 mg/dL — ABNORMAL HIGH (ref 70–99)
Potassium: 3.3 mmol/L — ABNORMAL LOW (ref 3.5–5.1)
Sodium: 137 mmol/L (ref 135–145)

## 2023-09-20 MED ORDER — POTASSIUM CHLORIDE CRYS ER 20 MEQ PO TBCR: 20.0000 meq | EXTENDED_RELEASE_TABLET | Freq: Every day | ORAL | 0 refills | Status: DC

## 2023-09-20 MED ORDER — DIPHENHYDRAMINE HCL 25 MG PO CAPS
25.0000 mg | ORAL_CAPSULE | Freq: Once | ORAL | Status: AC
  Administered 2023-09-20: 25 mg via ORAL

## 2023-09-20 MED ORDER — ACETAMINOPHEN 325 MG PO TABS
650.0000 mg | ORAL_TABLET | Freq: Once | ORAL | Status: AC
  Administered 2023-09-20: 650 mg via ORAL

## 2023-09-20 MED ORDER — SODIUM CHLORIDE 0.9 % IV SOLN
510.0000 mg | Freq: Once | INTRAVENOUS | Status: AC
  Administered 2023-09-20: 510 mg via INTRAVENOUS
  Filled 2023-09-20: qty 17

## 2023-09-20 NOTE — Progress Notes (Signed)
 Diagnosis: Iron Deficiency Anemia  Provider:  Brooke Bonito NP  Procedure: IV Infusion  IV Type: Peripheral, IV Location: L Antecubital  Feraheme (Ferumoxytol), Dose: 510 mg  Infusion Start Time: 1250  Infusion Stop Time: 1307  Post Infusion IV Care: Observation period completed and Peripheral IV Discontinued  Discharge: Condition: Good, Destination: Home . AVS Provided  Performed by:  Arrie Senate, RN

## 2023-09-20 NOTE — Progress Notes (Signed)
 Patient came in today c/o new onset shortness of breath and cough that started this week. Spoke with anesthesia and decided he needs to follow up with his primary doctor. He is to follow up with Dr Neita Carp tomorrow for clearance.

## 2023-09-21 ENCOUNTER — Telehealth: Payer: Self-pay | Admitting: *Deleted

## 2023-09-21 ENCOUNTER — Encounter: Payer: Self-pay | Admitting: Internal Medicine

## 2023-09-21 DIAGNOSIS — I5033 Acute on chronic diastolic (congestive) heart failure: Secondary | ICD-10-CM | POA: Diagnosis not present

## 2023-09-21 DIAGNOSIS — D638 Anemia in other chronic diseases classified elsewhere: Secondary | ICD-10-CM | POA: Diagnosis not present

## 2023-09-21 DIAGNOSIS — Z6824 Body mass index (BMI) 24.0-24.9, adult: Secondary | ICD-10-CM | POA: Diagnosis not present

## 2023-09-21 DIAGNOSIS — R051 Acute cough: Secondary | ICD-10-CM | POA: Diagnosis not present

## 2023-09-21 DIAGNOSIS — E876 Hypokalemia: Secondary | ICD-10-CM | POA: Diagnosis not present

## 2023-09-21 NOTE — Telephone Encounter (Signed)
 Marchelle Folks please schedule per Dr. [name] with app in 6 weeks

## 2023-09-21 NOTE — Telephone Encounter (Signed)
 Patient has been scheduled

## 2023-09-21 NOTE — Progress Notes (Signed)
 [name] [name] will not be receiving his flexible sigmoidoscopy tomorrow, 09/22/23.  He was awaiting ICD clearance and is not doing well currently.  Note in chart from Rocco Serene stated that he has been diagnosed with chronic diastolic heart failure; pending cardiology visit.  Carloyn Jaeger notified.

## 2023-09-21 NOTE — Telephone Encounter (Signed)
 Received call from [facility] regarding pt's procedure for tomorrow. She says pt is not going to be able to have procedure done. He is not doing good and was dx with acute chronic diastolic heart failure today in the office. They are in the process of getting him in with cardiology. FYI

## 2023-09-22 ENCOUNTER — Encounter (HOSPITAL_COMMUNITY): Payer: Self-pay | Admitting: Anesthesiology

## 2023-09-22 ENCOUNTER — Ambulatory Visit (HOSPITAL_COMMUNITY): Admission: RE | Admit: 2023-09-22 | Source: Home / Self Care | Admitting: Internal Medicine

## 2023-09-22 ENCOUNTER — Encounter (HOSPITAL_COMMUNITY): Admission: RE | Payer: Self-pay | Source: Home / Self Care

## 2023-09-22 SURGERY — SIGMOIDOSCOPY, FLEXIBLE
Anesthesia: Choice

## 2023-09-26 ENCOUNTER — Other Ambulatory Visit: Payer: Self-pay | Admitting: Internal Medicine

## 2023-09-26 NOTE — Progress Notes (Unsigned)
 Cardiology Office Note    Date:  09/30/2023  ID:  [name] [name], DOB August 21, 1945, MRN [id number] PCP:  [name], MD  Cardiologist:  [name], MD  Electrophysiologist:  [name], MD   Chief Complaint: Shortness of breath   History of Present Illness: .    [name] [name] is a 78 y.o. male with visit-pertinent history of CAD s/p CABG in 2013 with ischemic cardiomyopathy-EF 20-25% prior to surgery, improved to 40-45% by 10/2012, now 30-35%, mild MR, PAD, DM and PAF on chronic anticoagulation s/p ablation. Reduction in EF led to ICD insertion in 2023.      Heart catheterization in 08/2013 showed patent LIMA to LAD, SVG-PLA, SVG-PDA.  Most recent heart catheterization in 11/2021 showed patent LIMA to LAD but occlusion of both SVG grafts.  Given wall motion abnormality on echocardiogram, viability study was recommended to help make decisions regarding complex atherectomy and intervention of the left circumflex that would involve stenting back of the left main and covering the long segment throughout the circumflex to treat multiple severe calcific lesions.  Cardiac MRI was pursued and showed EF 20% and severe thinning in the apical to basal lateral wall and apical inferior wall suggesting nonviability in these regions.  Therefore complex intervention was not pursued.  He was treated medically.  Given persistent reduction in EF he underwent BiV ICD insertion CRT-D on 04/30/2022.   In March 2024 he reported worsening shortness of breath.  Device interrogation showed possible fluid accumulation in atrial fibrillation.  Diuretic was increased and EKG showed underlying A-fib with ventricularly paced rhythm and PVCs.  He presented for scheduled outpatient cardioversion on 10/16/2022 and was successfully cardioverted to a paced BiV rhythm.  He was seen by Dr. [name] on 10/27/2022 and was back in afib, he was started on amiodarone.   Cardiology was consulted in 10/2022 while patient was  admitted in setting of mechanical fall resulting in subdural hematoma 4 mm without any mass effect.  After discharge he fell again, he resumed Eliquis on 11/24/22. On 12/23/2022 he presented for DCCV which was successful.  On 01/19/23 he underwent afib ablation with Dr. [name], on follow up with afib clinic on 8/20 he reported no further afib. He was last seen in clinic on 02/26/23 by Dr. [name], it was noted that he had lost 50 lbs over the past year, he had an abnormal cologuard test with his PCP.      On 04/17/23 patient underwent colonoscopy, was found to have multiple scattered diverticula as well as 3 polyps that were clipped with cold snare's.  Following he had normal bowel movements until 2 days after colonoscopy when he noticed bright red blood from his rectum during bowel movements.  He presented to the ED on 04/19/2023, blood pressures were soft and stools positive for blood.  His hemoglobin at baseline is around 11, decreased to 8.3.  He received 1 unit of packed red blood cells, hemoglobin proved to 9.4.  He underwent repeat colonoscopy while inpatient showing a single post polypectomy ulcer in the cecum.  He had multiple clips placed at previous polypectomy.  While inpatient he was found to be orthostatic, he was given a 500 mL fluid bolus, his Lasix was held while inpatient.  He was started on midodrine 5 mg 3 times daily.  His Coreg was discontinued, he was instructed to resume Eliquis on 04/26/2023.   On 04/22/2023 he had a device check which showed 0% A-fib burden, OptiVol  thoracic impedance suggested possible fluid accumulation starting 10/3 and becoming worse during and following hospital discharge on 10/23.  Following discharge he was restarted on his furosemide 40 mg daily with ICM clinic phone appointment on 05/03/2023 to recheck fluid levels.   He was seen in clinic on 04/27/23. He felt he was improving, noted that he felt significantly better than he did the week prior.  He reported that he  had stopped midodrine as he was noting intermittent headaches and difficulty with urination.  With lower extremity edema noted on exam and thoracic impedance noted possible fluid accumulation his Lasix was increased to 40 mg in the morning and afternoon for 4 days. Patient had noted improvement in fluid status on follow up however, on 06/07/2023 he notified the office of feeling increasingly short of breath with minimal exertion, tired with small amount of leg swelling and a 3 to 4 pound weight gain over the last couple of weeks.  His weight was 209 pounds, prior weight at office visit 204 pounds.  Patient reported he been taking furosemide 40 mg twice a day for 4 days with no relief of symptoms.  OptiVol suggested possible fluid accumulation since 11/15.  Patient completed Furoscix for one dose and reported improvement.   Patient was last in clinic on 09/03/2023 by Dr. [name].  Patient had recently returned from [place], was planning to undergo sigmoidoscopy with Dr. [name].  Patient reported he had some shortness of breath and still gets weak and fatigued with exertion.  Patient reported that his shortness of breath had not been improving in the prior few days.  Patient was advised that he could proceed with sigmoidoscopy acceptable risk.  On 09/21/2023 patient notified the office that his procedure had been canceled, he noted that his shortness of breath had worsened and was seen by his PCP who noted that he had a good amount of fluid in 1 lung and some of the other.  Patient's morning Lasix was doubled and he was taking an additional pill in the afternoon.  Today patient presents regarding increased shortness of breath.  Patient notes that since increasing his Lasix last week he has had improvement in symptoms.  He also endorses increased lower extremity edema and some mild orthopnea.  Patient notes that he wears a BiPAP at night and this assists with feeling short of breath when lying down flat.  Patient notes  that he initially did have weight gain but feels that he is closer back to his dry or weight.  He notes that even with increasing his doses of oral Lasix he has had ongoing shortness of breath, fatigue and lower extremity edema.  Labwork independently reviewed: 09/28/23: Sodium 140, potassium 4.2, creatinine 1.17, NT proBNP 5442 ROS: .   Today he denies chest pain, palpitations, melena, hematuria, hemoptysis, diaphoresis, weakness, presyncope, syncope, orthopnea, and PND.  All other systems are reviewed and otherwise negative. Studies Reviewed: Marland Kitchen   EKG:  EKG is not ordered today.  CV Studies: Cardiac studies reviewed are outlined and summarized above. Otherwise please see EMR for full report. Cardiac Studies & Procedures   ______________________________________________________________________________________________ CARDIAC CATHETERIZATION  CARDIAC CATHETERIZATION 12/15/2021  Narrative 1.  Severe native coronary artery disease with patency of the left main, total occlusion of the LAD just after the first diagonal, multiple severe calcific stenoses throughout the entirety of the circumflex distribution, and moderate stenoses in the RCA 2.  Status post CABG with continued patency of the LIMA to LAD, and interval occlusion of  the SVG to PDA and SVG to PLA 3.  Normal LVEDP  Heart team review, consider medical therapy versus complex atherectomy and intervention of the left circumflex which would involve stenting back into the left main and covering a long segment throughout the circumflex in order to treat multiple severe calcific lesions.  Note that the patient had akinesis of the inferior and posterior walls on his echo and hypokinesis of the anterior wall segments.  He does not have angina.  Might be appropriate to perform a viability study.  Will discuss his case with Dr. [name].  Findings Coronary Findings Diagnostic  Dominance: Right  Left Main Ost LM to LM lesion is 30% stenosed. The lesion  is discrete. The lesion is moderately calcified.  Left Anterior Descending Vessel is small. Ost LAD to Prox LAD lesion is 50% stenosed. Mid LAD lesion is 100% stenosed. The lesion is severely calcified.  Left Circumflex Vessel is small. The entire circumflex vessel is severely calcified.  There is high-grade 95% stenosis at the ostium, severe eccentric 95% stenosis in the proximal vessel, severe 90% stenosis in the mid vessel, and severe 90% stenosis in the distal vessel just before it branches into 2 OM&#39;s Ost Cx lesion is 95% stenosed. The lesion is calcified. Prox Cx to Mid Cx lesion is 90% stenosed. The lesion is eccentric. The lesion is severely calcified. Mid Cx to Dist Cx lesion is 90% stenosed. The lesion is discrete.  Right Coronary Artery Prox RCA lesion is 60% stenosed. The lesion is type C and discrete. The lesion is severely calcified. Dist RCA lesion is 50% stenosed.  Right Posterior Descending Artery Vessel is small in size.  Right Posterior Atrioventricular Artery Vessel is small in size. RPAV lesion is 100% stenosed.  Graft To RPDA And is moderate in size.  The graft exhibits minimal luminal irregularities. Origin to Prox Graft lesion is 100% stenosed.  Graft To RPAV And is small.  The graft exhibits no disease. Origin to Prox Graft lesion is 100% stenosed.  LIMA LIMA Graft To Dist LAD LIMA and is moderate in size.  The graft exhibits no disease. There is competitive flow. The LIMA to LAD graft is widely patent with no stenosis.  This fills the entirety of the LAD as it is occluded just beyond the first diagonal branch  Intervention  No interventions have been documented.   CARDIAC CATHETERIZATION  CARDIAC CATHETERIZATION 09/13/2015  Narrative  SVG was injected is moderate in size. numerous valves  The graft exhibits minimal luminal irregularities.  SVG was injected is small, and is anatomically normal.  Prox RCA lesion, 85% stenosed.  Prox LAD  lesion, 100% stenosed.  Pedicle LIMA was injected is moderate in size, and is anatomically normal.  There is competitive flow.  Mid Cx to Dist Cx lesion, 30% stenosed.  Post Atrio lesion, 100% stenosed.  Ost LM to LM lesion, 30% stenosed.  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 Patent LIMA-LAD, SVG-PDA and SVG-PLB grafts with TIMI III flow and good distal runoff. LVEDP = 16 mmHg. Moderately reduced CO by Fick of 4.8 L/min and CI of 2 L/min, RA of 6 and PA mean of 29 mmHg. Compensated right and left heart pressures. LVEF 30-35% with global hypokinesis and inferior akinesis.  [name], MD, [facility] Attending Cardiologist [facility]  Findings Coronary Findings Diagnostic  Dominance: Right  Left Main Moderately Calcified discrete.  Left Anterior Descending . Vessel is small. Severely Calcified.  Left Circumflex . Vessel is small. Discrete.  Right Coronary Artery The lesion is type C Discrete.  Right Posterior Descending Artery The vessel is small in size.  Right Posterior Atrioventricular Artery The vessel is small in size.  Single Graft Graft To RPDA SVG was injected is moderate in size. numerous valves  The graft exhibits minimal luminal irregularities.  Single Graft Graft To RPAV SVG was injected is small, and is anatomically normal.  LIMA LIMA Graft To Dist LAD LIMA was injected is moderate in size, and is anatomically normal. There is competitive flow.  Intervention  No interventions have been documented.   STRESS TESTS  NM MYOCAR MULTI W/SPECT W 10/20/2010   ECHOCARDIOGRAM  ECHOCARDIOGRAM COMPLETE 02/19/2023  Narrative ECHOCARDIOGRAM REPORT    Patient Name:   [name] [name] Date of Exam: 02/19/2023 Medical Rec #:  [id number]      Height:       74.0 in Accession #:    [id number]     Weight:       205.2 lb Date of Birth:  June 11, 1946     BSA:          2.197 m Patient Age:    26 years       BP:            110/59 mmHg Patient Gender: M              HR:           67 bpm. [facility]  Procedure: 2D Echo, Cardiac Doppler and Color Doppler  Indications:    Congestive Heart Failure I50.9  History:        Patient has prior history of Echocardiogram examinations, most recent 11/10/2022. Cardiomyopathy and CHF, CAD, Prior CABG and Defibrillator, Stroke, Arrythmias:Atrial Fibrillation; Risk Factors:Diabetes, Hypertension and Dyslipidemia.  Sonographer:    Celesta Gentile RCS Referring Phys: [id number] KENNETH C [name]  IMPRESSIONS   1. Left ventricular ejection fraction, by estimation, is 30 to 35%. The left ventricle has moderately decreased function. The left ventricle demonstrates regional wall motion abnormalities (see scoring diagram/findings for description). The left ventricular internal cavity size was moderately dilated. There is mild asymmetric left ventricular hypertrophy of the basal segment. Left ventricular diastolic parameters are consistent with Grade III diastolic dysfunction (restrictive). 2. Right ventricular systolic function is mildly reduced. The right ventricular size is normal. There is severely elevated pulmonary artery systolic pressure. The estimated right ventricular systolic pressure is 60.1 mmHg. 3. Left atrial size was mildly dilated. 4. Right atrial size was mildly dilated. 5. The mitral valve is abnormal with restricted posterior leaflet. Mild mitral valve regurgitation. 6. Tricuspid valve regurgitation is mild to moderate. 7. The aortic valve is tricuspid. Aortic valve regurgitation is not visualized. 8. The inferior vena cava is dilated in size with >50% respiratory variability, suggesting right atrial pressure of 8 mmHg.  Comparison(s): Prior images reviewed side by side. LVEF 30-35% with wall motion abnormalities consistent with ischemic cardiomyopathy. Mild mitral regurgitation. Severely elevated estimated RVSP of 60 mmHg.  FINDINGS Left Ventricle:  Left ventricular ejection fraction, by estimation, is 30 to 35%. The left ventricle has moderately decreased function. The left ventricle demonstrates regional wall motion abnormalities. The left ventricular internal cavity size was moderately dilated. There is mild asymmetric left ventricular hypertrophy of the basal segment. Left ventricular diastolic parameters are consistent with Grade III diastolic dysfunction (restrictive).   LV Wall Scoring: The antero-lateral wall and posterior wall are akinetic. The mid  and distal anterior septum, inferior septum, apical lateral segment, and apex are hypokinetic. The basal anteroseptal segment is normal.  Right Ventricle: The right ventricular size is normal. No increase in right ventricular wall thickness. Right ventricular systolic function is mildly reduced. There is severely elevated pulmonary artery systolic pressure. The tricuspid regurgitant velocity is 3.61 m/s, and with an assumed right atrial pressure of 8 mmHg, the estimated right ventricular systolic pressure is 60.1 mmHg.  Left Atrium: Left atrial size was mildly dilated.  Right Atrium: Right atrial size was mildly dilated.  Pericardium: There is no evidence of pericardial effusion.  Mitral Valve: The mitral valve is abnormal. Mild mitral valve regurgitation. MV peak gradient, 7.8 mmHg. The mean mitral valve gradient is 2.0 mmHg.  Tricuspid Valve: The tricuspid valve is grossly normal. Tricuspid valve regurgitation is mild to moderate.  Aortic Valve: The aortic valve is tricuspid. There is mild aortic valve annular calcification. Aortic valve regurgitation is not visualized.  Pulmonic Valve: The pulmonic valve was grossly normal. Pulmonic valve regurgitation is trivial.  Aorta: The aortic root is normal in size and structure.  Venous: The inferior vena cava is dilated in size with greater than 50% respiratory variability, suggesting right atrial pressure of 8 mmHg.  IAS/Shunts: No  atrial level shunt detected by color flow Doppler.  Additional Comments: A device lead is visualized.   LEFT VENTRICLE PLAX 2D LVIDd:         6.40 cm      Diastology LVIDs:         5.30 cm      LV e' medial:    3.06 cm/s LV PW:         0.80 cm      LV E/e' medial:  37.9 LV IVS:        1.20 cm      LV e' lateral:   5.12 cm/s LVOT diam:     2.00 cm      LV E/e' lateral: 22.7 LV SV:         57 LV SV Index:   26 LVOT Area:     3.14 cm  LV Volumes (MOD) LV vol d, MOD A2C: 163.0 ml LV vol d, MOD A4C: 167.0 ml LV vol s, MOD A2C: 101.0 ml LV vol s, MOD A4C: 111.0 ml LV SV MOD A2C:     62.0 ml LV SV MOD A4C:     167.0 ml LV SV MOD BP:      60.2 ml  RIGHT VENTRICLE RV S prime:     7.25 cm/s TAPSE (M-mode): 1.9 cm  LEFT ATRIUM              Index        RIGHT ATRIUM           Index LA diam:        4.70 cm  2.14 cm/m   RA Area:     24.60 cm LA Vol (A2C):   103.0 ml 46.88 ml/m  RA Volume:   75.10 ml  34.18 ml/m LA Vol (A4C):   56.4 ml  25.67 ml/m LA Biplane Vol: 78.1 ml  35.54 ml/m AORTIC VALVE LVOT Vmax:   75.70 cm/s LVOT Vmean:  48.500 cm/s LVOT VTI:    0.181 m  AORTA Ao Root diam: 3.30 cm  MITRAL VALVE                TRICUSPID VALVE MV Area (PHT): 4.31 cm  TR Peak grad:   52.1 mmHg MV Area VTI:   1.52 cm     TR Vmax:        361.00 cm/s MV Peak grad:  7.8 mmHg MV Mean grad:  2.0 mmHg     SHUNTS MV Vmax:       1.40 m/s     Systemic VTI:  0.18 m MV Vmean:      54.4 cm/s    Systemic Diam: 2.00 cm MV Decel Time: 176 msec MR Peak grad: 82.8 mmHg MR Mean grad: 49.0 mmHg MR Vmax:      455.00 cm/s MR Vmean:     318.0 cm/s MV E velocity: 116.00 cm/s MV A velocity: 41.10 cm/s MV E/A ratio:  2.82  Nona Dell MD Electronically signed by Nona Dell MD Signature Date/Time: 02/19/2023/10:45:43 AM    Final        CARDIAC MRI  MR CARDIAC MORPHOLOGY W WO CONTRAST 01/19/2022  Narrative CLINICAL DATA:  12M with CAD s/p CABG x3 (LIMa-LAD,  SVG-PDA, SVG-PLA). Echo 12/05/21 showed EF 30-35%. Cath 12/15/21 showed patient LIMA-LAD, occluded SVG-PDA and SVG-PLA. Evaluate viability.  EXAM: CARDIAC MRI  TECHNIQUE: The patient was scanned on a 1.5 Tesla Siemens magnet. A dedicated cardiac coil was used. Functional imaging was done using Fiesta sequences. 2,3, and 4 chamber views were done to assess for RWMA's. Modified Simpson's rule using a short axis stack was used to calculate an ejection fraction on a dedicated work [employer]. The patient received 10 cc of Gadavist. After 10 minutes inversion recovery sequences were used to assess for infiltration and scar tissue.  CONTRAST:  10 cc  of Gadavist  FINDINGS: Left ventricle:  -Severe dilatation  -Severe systolic dysfunction. Thinning/akinesis of lateral and inferior walls. Lateral wall and apical inferior wall measures <4.92mm in thickness, suggesting nonviability.  -Elevated ECV (38%)  -No LGE  LV EF: 28% (Normal 56-78%)  Absolute volumes:  LV EDV: (Normal 77-195 mL)  LV ESV: (Normal 19-72 mL)  LV SV: 95mL (Normal 51-133 mL)  CO: 6.0L/min (Normal 2.8-8.8 L/min)  Indexed volumes:  LV EDV: 171mL/sq-m (Normal 47-92 mL/sq-m)  LV ESV: 116mL/sq-m (Normal 13-30 mL/sq-m)  LV SV: 7mL/sq-m (Normal 32-62 mL/sq-m)  CI: 2.7L/min/sq-m (Normal 1.7-4.2 L/min/sq-m)  Right ventricle: Mild dilatation with mild systolic dysfunction  RV EF:  08% (Normal 47-74%)  Absolute volumes:  RV EDV: (Normal 88-227 mL)  RV ESV: (Normal 23-103 mL)  RV SV: 96mL (Normal 52-138 mL)  CO: 6.1L/min (Normal 2.8-8.8 L/min)  Indexed volumes:  RV EDV: 155mL/sq-m (Normal 55-105 mL/sq-m)  RV ESV: 8mL/sq-m (Normal 15-43 mL/sq-m)  RV SV: 36mL/sq-m (Normal 32-64 mL/sq-m)  CI: 2.7L/min/sq-m (Normal 1.7-4.2 L/min/sq-m)  Left atrium: Mild enlargement  Right atrium: Mild enlargement  Mitral valve: Moderate mitral regurgitation  (regurgitant fraction 27%)  Aortic valve: No regurgitation  Tricuspid valve: Moderate tricuspid regurgitation (regurgitant fraction 25%)  Pulmonic valve: No regurgitation  Aorta: Normal proximal ascending aorta  Pericardium: Normal  IMPRESSION: 1. Severe LV dilatation with severe systolic dysfunction (EF 28%). Thinning/akinesis of inferior and lateral walls.  2.  Mild RV dilatation with mild systolic dysfunction (EF 40%)  3. While no late gadolinium enhancement is seen, there is severe thinning (less than 4.13mm) in the apical to basal lateral wall and apical inferior wall, suggesting nonviability in these regions.  4.  Moderate mitral regurgitation (regurgitant fraction 27%)  5.  Moderate tricuspid regurgitation (regurgitant fraction 25%)   Electronically Signed By: Cristal Deer  Bjorn Pippin M.D. On: 01/20/2022 23:31   ______________________________________________________________________________________________       Current Reported Medications:.    Current Meds  Medication Sig   acetaminophen (TYLENOL) 500 MG tablet Take 500-1,000 mg by mouth every 6 (six) hours as needed (for pain.).   apixaban (ELIQUIS) 5 MG TABS tablet Take 1 tablet (5 mg total) by mouth 2 (two) times daily.   carvedilol (COREG) 3.125 MG tablet Take 1 tablet by mouth twice daily   empagliflozin (JARDIANCE) 25 MG TABS tablet Take 1 tablet (25 mg total) by mouth daily.   Furosemide (FUROSCIX) 80 MG/10ML CTKT Apply 1 time   Furosemide (FUROSCIX) 80 MG/10ML CTKT Inject 1 Application into the skin once for 1 dose.   furosemide (LASIX) 40 MG tablet Take 1 tablet (40 mg total) by mouth 2 (two) times daily. Take an additional 20 mg once a day as needed for any sudden weight gain of 3 lbs overnight or 5 lbs over a week or any lower extremity swelling or shortness of breath.   gabapentin (NEURONTIN) 300 MG capsule Take 600 mg by mouth at bedtime.   metFORMIN (GLUCOPHAGE) 1000 MG tablet Take 1,000 mg by mouth  2 times daily at 12 noon and 4 pm.   metFORMIN (GLUCOPHAGE-XR) 500 MG 24 hr tablet Take 1,000 mg by mouth 2 (two) times daily.   midodrine (PROAMATINE) 5 MG tablet Take 1 tablet (5 mg total) by mouth as needed. For systolic blood pressure <95   ONETOUCH VERIO test strip 1 each daily.   polyethylene glycol (MIRALAX / GLYCOLAX) 17 g packet Take 17 g by mouth daily as needed for mild constipation.   potassium chloride SA (KLOR-CON M20) 20 MEQ tablet Take 2 tablets (40 mEq total) by mouth daily.   rosuvastatin (CRESTOR) 10 MG tablet Take 1 tablet (10 mg total) by mouth every other day.   senna-docusate (SENOKOT-S) 8.6-50 MG tablet Take 1 tablet by mouth at bedtime as needed for mild constipation.    Physical Exam:    VS:  BP 106/60   Pulse 67   Ht 6\' 1"  (1.854 m)   Wt 212 lb 6.4 oz (96.3 kg)   SpO2 94%   BMI 28.02 kg/m    Wt Readings from Last 3 Encounters:  09/29/23 212 lb 6.4 oz (96.3 kg)  09/28/23 212 lb (96.2 kg)  09/20/23 211 lb (95.7 kg)    GEN: Well nourished, well developed in no acute distress NECK: ~8cm JVD; No carotid bruits CARDIAC: RRR, no murmurs, rubs, gallops RESPIRATORY:  Crackles, dull basilar bilaterally at least midway  ABDOMEN: Soft, non-tender, non-distended EXTREMITIES:  2+ edema to mid shin bilaterally; No deformity     Asessement and Plan:.    Chronic systolic heart failure: EF 28% by CT MRI in 12/2021, CRT-D inserted in 2023.  Most recent echo on 02/19/2023 indicated LVEF of 30 to 35%, LV with moderately decreased function, LV demonstrating regional wall motion abnormalities, LV internal cavity was moderately dilated, grade 3 diastolic dysfunction.  RV systolic function was mildly reduced.  There is severely elevated pulmonary artery systolic pressure. On 10/24 device check showed 0% A-fib burden, OptiVol thoracic impedance suggested possible fluid accumulation starting 10/3 and becoming worse during and following hospital discharge on 10/23. Coreg was  discontinued during admission for low blood pressure.  On 06/07/23 he noted increased shortness of breath, fatigue and lower extremity edema, he noted a 3 to 4 lb weight gain in the weeks prior. He was started on Furiosx and  completed three injections with significant improvement.  Seen in clinic by Dr. [name] at the beginning of March and was doing well overall, OptiVol thoracic impedance suggested improvement in fluid accumulation.  Patient was to have colonoscopy at the end of March, his PCP felt that he was fluid overloaded and chest x-ray indicated pleural effusions per report, unable to review chest x-ray.  Today patient appears volume up, he endorses increased shortness of breath, mild orthopnea and increased lower extremity edema.  He notes that his weights at home have been improving with increased doses of Lasix however he continues to have shortness of breath and lower extremity edema.  On exam he has 1-2+ pitting edema to at least mid shin, on auscultation has dull basilar sounds with crackles bilaterally.  Discussed with patient presenting to the ED, patient declined in favor of trying outpatient for Furoscix patient will take first unit today followed by 2 more doses.  Patient will take potassium chloride 40 mill equivalents. Encouraged patient to strictly monitor his blood pressure at home as he does run overall soft he will hold his carvedilol if systolic blood pressure is less than 100.  Patient will have recheck of BNP and be met on Monday with close follow-up next week.  At this time patient appears stable, however discussed with him if he worsens would recommend evaluation in the emergency department. Encouraged to fluid and salt restriction, patient notes with increased Lasix intake he was actually increasing the amount of fluids that he was drinking. Continue continue Jardiance 25 mg daily. Following use of furoscix he will return resume Lasix 40 mg twice daily, to take an additional 20 mg of  lasix as needed for weight gain of 2 to 3 pounds overnight, 5 pounds in a week, increased lower extremity edema or shortness of breath.   Atrial fibrillation: Underwent DCCV in 04/2019 and 09/2022.  Following cardioversion in April 2024 his ICD transmission suggested he had an early return to A-fib after. Underwent amiodarone loading and DCCV on 12/23/2022. On 01/19/23 he underwent afib ablation with Dr. [name]. Amiodarone discontinued in November, 2024. He denies palpitations or feeling of increased heart rate. Continue Eliquis 5 mg twice daily, patient denies bleeding problems.    GI bleeding/Chronic anticoagulation: Patient had colonoscopy on 04/17/23, admitted with BRBPR on 04/19/23. He received 1 unit PRBC and IV fluids in setting of hypotension. Underwent repeat colonoscopy with further clips placed. He denies any further bleeding. Restarted on Eliquis 5 mg twice daily, he denies any further bleeding problems.  Patient was planned to undergo colonoscopy at the end of March, currently pending given fluid status.   CAD: s/p CABG in 2013. Last heart catheterization in 11/2021 indicated occluded SVG to PDA and SVG to PLA, he patent LIMA to LAD.  Recommended to treat medically. Stable with no anginal symptoms. No indication for ischemic evaluation.  No ASA given need for Eliquis.   Hypotension: Blood pressure today 105/62, on recheck was 106/60.  Patient instructed to hold carvedilol if systolic blood pressures less than 100.  Will continue midodrine as needed for systolic blood pressure less than 95.   Hyperlipidemia: On rosuvastatin 10 mg every other day.   OSA on BiPAP: Patient reports nightly compliance.    Severe MR: Noted to be mild on echo on 02/19/2023.  Will continue monitoring with echoes repeat.   Disposition: F/u with [name], NP in one week.   Signed, [name], NP

## 2023-09-28 ENCOUNTER — Encounter: Payer: Self-pay | Admitting: Student

## 2023-09-28 ENCOUNTER — Ambulatory Visit: Payer: Medicare Other | Attending: Student | Admitting: Student

## 2023-09-28 VITALS — BP 110/50 | HR 61 | Ht 73.0 in | Wt 212.0 lb

## 2023-09-28 DIAGNOSIS — I5022 Chronic systolic (congestive) heart failure: Secondary | ICD-10-CM

## 2023-09-28 DIAGNOSIS — D6869 Other thrombophilia: Secondary | ICD-10-CM | POA: Diagnosis not present

## 2023-09-28 DIAGNOSIS — Z951 Presence of aortocoronary bypass graft: Secondary | ICD-10-CM | POA: Diagnosis not present

## 2023-09-28 DIAGNOSIS — I255 Ischemic cardiomyopathy: Secondary | ICD-10-CM

## 2023-09-28 DIAGNOSIS — I4819 Other persistent atrial fibrillation: Secondary | ICD-10-CM

## 2023-09-28 DIAGNOSIS — Z95810 Presence of automatic (implantable) cardiac defibrillator: Secondary | ICD-10-CM | POA: Diagnosis not present

## 2023-09-28 DIAGNOSIS — I10 Essential (primary) hypertension: Secondary | ICD-10-CM | POA: Diagnosis not present

## 2023-09-28 LAB — CUP PACEART INCLINIC DEVICE CHECK
Battery Remaining Longevity: 86 mo
Battery Voltage: 3.01 V
Brady Statistic AP VP Percent: 18.1 %
Brady Statistic AP VS Percent: 0.07 %
Brady Statistic AS VP Percent: 80.16 %
Brady Statistic AS VS Percent: 1.66 %
Brady Statistic RA Percent Paced: 17.19 %
Brady Statistic RV Percent Paced: 92.63 %
Date Time Interrogation Session: 20250401121312
HighPow Impedance: 53 Ohm
Implantable Lead Connection Status: 753985
Implantable Lead Connection Status: 753985
Implantable Lead Connection Status: 753985
Implantable Lead Implant Date: 20231102
Implantable Lead Implant Date: 20231102
Implantable Lead Implant Date: 20231102
Implantable Lead Location: 753858
Implantable Lead Location: 753859
Implantable Lead Location: 753860
Implantable Lead Model: 4598
Implantable Lead Model: 5076
Implantable Lead Model: 6935
Implantable Pulse Generator Implant Date: 20231102
Lead Channel Impedance Value: 128.074
Lead Channel Impedance Value: 136.276
Lead Channel Impedance Value: 136.276
Lead Channel Impedance Value: 141.867
Lead Channel Impedance Value: 152 Ohm
Lead Channel Impedance Value: 247 Ohm
Lead Channel Impedance Value: 247 Ohm
Lead Channel Impedance Value: 266 Ohm
Lead Channel Impedance Value: 304 Ohm
Lead Channel Impedance Value: 304 Ohm
Lead Channel Impedance Value: 342 Ohm
Lead Channel Impedance Value: 399 Ohm
Lead Channel Impedance Value: 456 Ohm
Lead Channel Impedance Value: 456 Ohm
Lead Channel Impedance Value: 456 Ohm
Lead Channel Impedance Value: 456 Ohm
Lead Channel Impedance Value: 494 Ohm
Lead Channel Impedance Value: 513 Ohm
Lead Channel Pacing Threshold Amplitude: 0.5 V
Lead Channel Pacing Threshold Amplitude: 0.75 V
Lead Channel Pacing Threshold Amplitude: 0.875 V
Lead Channel Pacing Threshold Pulse Width: 0.4 ms
Lead Channel Pacing Threshold Pulse Width: 0.4 ms
Lead Channel Pacing Threshold Pulse Width: 0.4 ms
Lead Channel Sensing Intrinsic Amplitude: 1.75 mV
Lead Channel Sensing Intrinsic Amplitude: 1.75 mV
Lead Channel Sensing Intrinsic Amplitude: 7.125 mV
Lead Channel Sensing Intrinsic Amplitude: 8.125 mV
Lead Channel Setting Pacing Amplitude: 1.5 V
Lead Channel Setting Pacing Amplitude: 1.75 V
Lead Channel Setting Pacing Amplitude: 2 V
Lead Channel Setting Pacing Pulse Width: 0.4 ms
Lead Channel Setting Pacing Pulse Width: 0.4 ms
Lead Channel Setting Sensing Sensitivity: 0.3 mV
Zone Setting Status: 755011

## 2023-09-28 NOTE — Progress Notes (Signed)
  Electrophysiology Office Note:   ID:  [name], [name] Jun 10, 1946, MRN [id number]  Primary Cardiologist: [name], MD Electrophysiologist: [name], MD      History of Present Illness:   [name] [name] is a 78 y.o. male with h/o CAD s/p CABG, OSA, HFrEF, s/p Medtronic CRT-D implanted 04/30/22, T2DM, HTN, HLD, and persistent atrial fibrillation  seen today for routine electrophysiology followup.   Since last being seen in our clinic the patient reports on going SOB for the past several weeks, for which a colonoscopy was deferred. He had a CXR done at his PCP, but this is not available for review. Drinking at least 3 large glasses of water a day, plus other fluids. He can only estimate the size/volume, but wife estimates he is drinking > 1.5 L a day. Avoids table salt, had chicken pot pie (homemade) for dinner last night.   Ankles with mild edema. SOB with most exertion.   Not really having and lightheadedness or dizziness currently but does have soft pressures at times.   Review of systems complete and found to be negative unless listed in HPI.   EP Information / Studies Reviewed:    EKG is not ordered today. EKG from 09/20/2023 reviewed which showed AS-VP at 64 bpm       ICD Interrogation-  reviewed in detail today,  See PACEART report.  Arrhythmia/Device History MDT Biv ICD 04/2022 for CHF  S/p PVI, CTI and posterior wall ablation 12/2022   Physical Exam:   VS:  BP (!) 110/50   Pulse 61   Ht 6\' 1"  (1.854 m)   Wt 212 lb (96.2 kg)   SpO2 98%   BMI 27.97 kg/m    Wt Readings from Last 3 Encounters:  09/28/23 212 lb (96.2 kg)  09/20/23 211 lb (95.7 kg)  09/03/23 211 lb (95.7 kg)     GEN: No acute distress  NECK: JVP ~8-9 cm; No carotid bruits CARDIAC: Regular rate and rhythm, no murmurs, rubs, gallops RESPIRATORY:  Dull basilar sounds with crackles ~ 1/3 of the way up concerning for pulmonary effusions.  ABDOMEN: Soft, non-tender,  non-distended EXTREMITIES:  1-2+ edema; No deformity   ASSESSMENT AND PLAN:    Chronic systolic CHF  s/p Medtronic CRT-D  Volume status at least slightly up  Stable on an appropriate medical regimen Normal ICD function See Pace Art report No changes today BMET and BNP today.  Continue lasix 80 mg q am with 40 mg q pm for now.  Reinforced fluid restriction to LESS THAN 2 L daily, sodium restriction to less than 2000 mg daily, and the importance of daily weights.   He has been drinking 'at least" 3 large cups of water a day. He will measure when he gets home, but wife suspects has been drinking far greater than 1.5 L.   They had been worried about DEhydration in the setting of prior orthostasis.   Persistent AF Recent EKG as above with sinus S/p ablation 12/2022 Off amiodarone Continue eliquis 5 mg BID for CHA2DS2/VASc of at least 8.   CAD S/p CABG Denies s/s ischemia  HTN Stable on current regimen  Could consider changing Coreg to toprol if need less vasoactive drug.   Disposition:   Follow up with Dr. [name] in 12 months. Can keep Gen cards appt for tomorrow;  Go home and measure fluids, and will get BMET/BNP today.    Signed, Graciella Freer, PA-C

## 2023-09-28 NOTE — Patient Instructions (Signed)
 Medication Instructions:  Your physician recommends that you continue on your current medications as directed. Please refer to the Current Medication list given to you today.  *If you need a refill on your cardiac medications before your next appointment, please call your pharmacy*  Lab Work: BMET, BNP-TODAY If you have labs (blood work) drawn today and your tests are completely normal, you will receive your results only by: MyChart Message (if you have MyChart) OR A paper copy in the mail If you have any lab test that is abnormal or we need to change your treatment, we will call you to review the results.  Follow-Up: At [facility], you and your health needs are our priority.  As part of our continuing mission to provide you with exceptional heart care, our providers are all part of one team.  This team includes your primary Cardiologist (physician) and Advanced Practice Providers or APPs (Physician Assistants and Nurse Practitioners) who all work together to provide you with the care you need, when you need it.  Your next appointment:   1 year(s)  Provider:   You may see Will Jorja Loa, MD or one of the following Advanced Practice Providers on your designated Care Team:   Francis Dowse, [place] Casimiro Needle "Mardelle Matte" [place], PA-C Canary Brim, NP      1st Floor: - Lobby - Registration  - Pharmacy  - Lab - Cafe  2nd Floor: - PV Lab - Diagnostic Testing (echo, CT, nuclear med)  3rd Floor: - Vacant  4th Floor: - TCTS (cardiothoracic surgery) - AFib Clinic - Structural Heart Clinic - Vascular Surgery  - Vascular Ultrasound  5th Floor: - HeartCare Cardiology (general and EP) - Clinical Pharmacy for coumadin, hypertension, lipid, weight-loss medications, and med management appointments    Valet parking services will be available as well.

## 2023-09-29 ENCOUNTER — Ambulatory Visit: Attending: Cardiology | Admitting: Cardiology

## 2023-09-29 VITALS — BP 106/60 | HR 67 | Ht 73.0 in | Wt 212.4 lb

## 2023-09-29 DIAGNOSIS — E785 Hyperlipidemia, unspecified: Secondary | ICD-10-CM | POA: Insufficient documentation

## 2023-09-29 DIAGNOSIS — R899 Unspecified abnormal finding in specimens from other organs, systems and tissues: Secondary | ICD-10-CM | POA: Insufficient documentation

## 2023-09-29 DIAGNOSIS — R5383 Other fatigue: Secondary | ICD-10-CM | POA: Insufficient documentation

## 2023-09-29 DIAGNOSIS — Z951 Presence of aortocoronary bypass graft: Secondary | ICD-10-CM | POA: Insufficient documentation

## 2023-09-29 DIAGNOSIS — I5022 Chronic systolic (congestive) heart failure: Secondary | ICD-10-CM | POA: Insufficient documentation

## 2023-09-29 DIAGNOSIS — Z79899 Other long term (current) drug therapy: Secondary | ICD-10-CM | POA: Insufficient documentation

## 2023-09-29 DIAGNOSIS — I4819 Other persistent atrial fibrillation: Secondary | ICD-10-CM | POA: Insufficient documentation

## 2023-09-29 DIAGNOSIS — I4811 Longstanding persistent atrial fibrillation: Secondary | ICD-10-CM | POA: Diagnosis not present

## 2023-09-29 DIAGNOSIS — G4733 Obstructive sleep apnea (adult) (pediatric): Secondary | ICD-10-CM | POA: Diagnosis not present

## 2023-09-29 DIAGNOSIS — D6869 Other thrombophilia: Secondary | ICD-10-CM | POA: Diagnosis not present

## 2023-09-29 DIAGNOSIS — I255 Ischemic cardiomyopathy: Secondary | ICD-10-CM | POA: Insufficient documentation

## 2023-09-29 DIAGNOSIS — R0602 Shortness of breath: Secondary | ICD-10-CM | POA: Insufficient documentation

## 2023-09-29 LAB — BASIC METABOLIC PANEL WITH GFR
BUN/Creatinine Ratio: 20 (ref 10–24)
BUN: 23 mg/dL (ref 8–27)
CO2: 27 mmol/L (ref 20–29)
Calcium: 8.7 mg/dL (ref 8.6–10.2)
Chloride: 98 mmol/L (ref 96–106)
Creatinine, Ser: 1.17 mg/dL (ref 0.76–1.27)
Glucose: 111 mg/dL — ABNORMAL HIGH (ref 70–99)
Potassium: 4.2 mmol/L (ref 3.5–5.2)
Sodium: 140 mmol/L (ref 134–144)
eGFR: 63 mL/min/{1.73_m2} (ref >59)

## 2023-09-29 LAB — PRO B NATRIURETIC PEPTIDE: NT-Pro BNP: 5442 pg/mL — ABNORMAL HIGH (ref 0–486)

## 2023-09-29 MED ORDER — FUROSCIX 80 MG/10ML ~~LOC~~ CTKT: 1.0000 | CARTRIDGE | Freq: Once | SUBCUTANEOUS | Status: DC

## 2023-09-29 MED ORDER — POTASSIUM CHLORIDE CRYS ER 20 MEQ PO TBCR: 40.0000 meq | EXTENDED_RELEASE_TABLET | Freq: Every day | ORAL | 0 refills | Status: DC

## 2023-09-29 NOTE — Patient Instructions (Signed)
 Medication Instructions:  Your physician has recommended you make the following change in your medication:  FOR 3 DAYS: Potassium 40 mEq once daily FOR 3 DAYS: Wear Furoscix HOLD: Lasix while wearing Furoscix  Hold Carvedilol if systolic blood pressure (top number) is less than 100 mmHg  Please continue to monitor weight and blood pressure daily.    HOW TO TAKE YOUR BLOOD PRESSURE: Rest 5 minutes before taking your blood pressure. Don't smoke or drink caffeinated beverages for at least 30 minutes before. Take your blood pressure before (not after) you eat. Sit comfortably with your back supported and both feet on the floor (don't cross your legs). Elevate your arm to heart level on a table or a desk. Use the proper sized cuff. It should fit smoothly and snugly around your bare upper arm. There should be enough room to slip a fingertip under the cuff. The bottom edge of the cuff should be 1 inch above the crease of the elbow. Ideally, take 3 measurements at one sitting and record the average.  *If you need a refill on your cardiac medications before your next appointment, please call your pharmacy*  Lab Work: Monday: BMET, Pro-BNP If you have labs (blood work) drawn today and your tests are completely normal, you will receive your results only by: MyChart Message (if you have MyChart) OR A paper copy in the mail If you have any lab test that is abnormal or we need to change your treatment, we will call you to review the results.   Follow-Up: At [facility], you and your health needs are our priority.  As part of our continuing mission to provide you with exceptional heart care, our providers are all part of one team.  This team includes your primary Cardiologist (physician) and Advanced Practice Providers or APPs (Physician Assistants and Nurse Practitioners) who all work together to provide you with the care you need, when you need it.  Your next appointment:   Tuesday or  Wednesday   Provider:   [name], NP    We recommend signing up for the patient portal called "MyChart".  Sign up information is provided on this After Visit Summary.  MyChart is used to connect with patients for Virtual Visits (Telemedicine).  Patients are able to view lab/test results, encounter notes, upcoming appointments, etc.  Non-urgent messages can be sent to your provider as well.   To learn more about what you can do with MyChart, go to [email or web address].   Other Instructions       1st Floor: - Lobby - Registration  - Pharmacy  - Lab - Cafe  2nd Floor: - PV Lab - Diagnostic Testing (echo, CT, nuclear med)  3rd Floor: - Vacant  4th Floor: - TCTS (cardiothoracic surgery) - AFib Clinic - Structural Heart Clinic - Vascular Surgery  - Vascular Ultrasound  5th Floor: - HeartCare Cardiology (general and EP) - Clinical Pharmacy for coumadin, hypertension, lipid, weight-loss medications, and med management appointments    Valet parking services will be available as well.

## 2023-09-30 ENCOUNTER — Encounter: Payer: Self-pay | Admitting: Cardiology

## 2023-09-30 ENCOUNTER — Telehealth: Payer: Self-pay | Admitting: Cardiology

## 2023-09-30 NOTE — Telephone Encounter (Signed)
 Pt daughter called in asking to speak with only a nurse about pt appt.

## 2023-09-30 NOTE — Telephone Encounter (Signed)
 Patient identification verified by 2 forms. Shade Flood, RN     Called and spoke to patient's daughter [facility]. EMILY states - patient was to be seen sooner than Thursday per K. WE[facility] ov note.  - patient currently scheduled to go out of town Thursday and this appt time conflicts those plans.   Interventions/Plan: - Reviewed office schedule. DOD appt available on Monday but patient needs to wait until labs are complete on 4/7 before F/U office visit.  - Patient added to waitlist.  - Irving Burton will call back to keep checking for sooner appt.

## 2023-10-04 ENCOUNTER — Encounter: Attending: Cardiology

## 2023-10-04 DIAGNOSIS — I5022 Chronic systolic (congestive) heart failure: Secondary | ICD-10-CM | POA: Insufficient documentation

## 2023-10-04 DIAGNOSIS — Z79899 Other long term (current) drug therapy: Secondary | ICD-10-CM | POA: Diagnosis not present

## 2023-10-04 DIAGNOSIS — R899 Unspecified abnormal finding in specimens from other organs, systems and tissues: Secondary | ICD-10-CM | POA: Diagnosis not present

## 2023-10-04 DIAGNOSIS — Z95810 Presence of automatic (implantable) cardiac defibrillator: Secondary | ICD-10-CM | POA: Insufficient documentation

## 2023-10-05 ENCOUNTER — Ambulatory Visit: Admitting: Cardiology

## 2023-10-05 LAB — PRO B NATRIURETIC PEPTIDE: NT-Pro BNP: 5155 pg/mL — ABNORMAL HIGH (ref 0–486)

## 2023-10-05 LAB — BASIC METABOLIC PANEL WITH GFR
BUN/Creatinine Ratio: 16 (ref 10–24)
BUN: 21 mg/dL (ref 8–27)
CO2: 24 mmol/L (ref 20–29)
Calcium: 8.9 mg/dL (ref 8.6–10.2)
Chloride: 97 mmol/L (ref 96–106)
Creatinine, Ser: 1.33 mg/dL — ABNORMAL HIGH (ref 0.76–1.27)
Glucose: 120 mg/dL — ABNORMAL HIGH (ref 70–99)
Potassium: 4.2 mmol/L (ref 3.5–5.2)
Sodium: 138 mmol/L (ref 134–144)
eGFR: 54 mL/min/{1.73_m2} — ABNORMAL LOW (ref >59)

## 2023-10-05 NOTE — Progress Notes (Signed)
 EPIC Encounter for ICM Monitoring  Patient Name: [name] [name] is a 78 y.o. male Date: 10/05/2023 Primary Care Physican: [name], MD Primary Cardiologist: [facility]        Electrophysiologist: [facility] Bi-V Pacing: 91.4%         09/21/2022 Weight: 213 lbs 10/06/2022 Weight: 219 lbs  11/17/2022 Weight: 212.5 lbs 12/22/2022 Weight: 211 lbs 01/13/2023 Weight: 209 lbs 05/14/2023 Office Weight: 204 lbs 12/31/024 Weight: 207.5 lbs 07/13/2023 Weight: 212 lbs 08/04/2023 Weight: 213 lbs   Clinical Status Since 28-Sep-2023 Time in AT/AF  0.0 hr/day (0.0%)                                              Transmission results reviewed.   Optivol thoracic impedance continues to suggesting normal fluid levels since 3/26 with the exception of possible fluid accumulation from 3/18-3/25.  Per Epic notes 3/25 chest x-ray showing fluid correlates with thoracic impedance 3/18-3/25.     4/3 office note reports he continues to have SOB and 1-2+ edema which does NOT correlate with normal fluid levels since 3/26.     Prescribed:  Furosemide 40 mg take 1 tablet(s) (40 mg total) by mouth twice a day.  Take an additional 20 mg once a day as needed for any sudden weight gain of 3 lbs overnight or 5 lbs in a week or any lower extremity swelling or SOB.  Furoscix Injection 1 dose 4/2   Labs: 10/04/2023 Creatinine 1.33, BUN 21, Potassium 4.2, Sodium 138, GFR 54, BNP 5,155  09/28/2023 Creatinine 1.17, BUN 23, Potassium 4.2, Sodium 140, GFR 63, BNP 5,442 09/20/2023 Creatinine 1.17, BUN 18, Potassium 3.3, Sodium 137  06/18/2023 Creatinine 1.26, BUN 19, Potassium 4.4, Sodium 142, GFR 58, BNP 428.5 05/14/2023 Creatinine 1.30, BUN 20, Potassium 4.2, Sodium 142, GFR 56 05/05/2023 Creatinine 1.30, BUN 18, Potassium 4.3, Sodium 142, GFR 56, BNP 398.0 04/27/2023 Creatinine 1.12, BUN 14, Potassium 4.5, Sodium 141, GFR 67, BNP 571.5 A complete set of results can be found in Results Review.   Recommendations:  Copy Sent to [facility] NP for review at 4/10 OV.      Follow-up plan: ICM clinic phone appointment on 10/18/2023 to recheck stability of fluid levels.   91 day device clinic remote transmission 11/01/2023.     EP/Cardiology Office Visits:   10/07/2023 with [name], NP.    Recall 09/22/2024 with Dr [name].      Copy of ICM check sent to Dr. [name].  3 month ICM trend: 10/04/2023.    12-14 Month ICM trend:     Karie Soda, RN 10/05/2023 3:26 PM

## 2023-10-06 NOTE — Progress Notes (Unsigned)
 Cardiology Office Note    Date:  10/06/2023  ID:  [name] [name], DOB 27-Feb-1946, MRN [id number] PCP:  [name], MD  Cardiologist:  [name], MD  Electrophysiologist:  [name], MD   Chief Complaint: ***  History of Present Illness: .    [name] [name] is a 78 y.o. male with visit-pertinent history of CAD s/p CABG in 2013 with ischemic cardiomyopathy-EF 20-25% prior to surgery, improved to 40-45% by 10/2012, now 30-35%, mild MR, PAD, DM and PAF on chronic anticoagulation s/p ablation. Reduction in EF led to ICD insertion in 2023.      Heart catheterization in 08/2013 showed patent LIMA to LAD, SVG-PLA, SVG-PDA.  Most recent heart catheterization in 11/2021 showed patent LIMA to LAD but occlusion of both SVG grafts.  Given wall motion abnormality on echocardiogram, viability study was recommended to help make decisions regarding complex atherectomy and intervention of the left circumflex that would involve stenting back of the left main and covering the long segment throughout the circumflex to treat multiple severe calcific lesions.  Cardiac MRI was pursued and showed EF 20% and severe thinning in the apical to basal lateral wall and apical inferior wall suggesting nonviability in these regions.  Therefore complex intervention was not pursued.  He was treated medically.  Given persistent reduction in EF he underwent BiV ICD insertion CRT-D on 04/30/2022.   In March 2024 he reported worsening shortness of breath.  Device interrogation showed possible fluid accumulation in atrial fibrillation.  Diuretic was increased and EKG showed underlying A-fib with ventricularly paced rhythm and PVCs.  He presented for scheduled outpatient cardioversion on 10/16/2022 and was successfully cardioverted to a paced BiV rhythm.  He was seen by Dr. [name] on 10/27/2022 and was back in afib, he was started on amiodarone.   Cardiology was consulted in 10/2022 while patient was admitted in setting of  mechanical fall resulting in subdural hematoma 4 mm without any mass effect.  After discharge he fell again, he resumed Eliquis on 11/24/22. On 12/23/2022 he presented for DCCV which was successful.  On 01/19/23 he underwent afib ablation with Dr. [name], on follow up with afib clinic on 8/20 he reported no further afib. He was last seen in clinic on 02/26/23 by Dr. [name], it was noted that he had lost 50 lbs over the past year, he had an abnormal cologuard test with his PCP.      On 04/17/23 patient underwent colonoscopy, was found to have multiple scattered diverticula as well as 3 polyps that were clipped with cold snare's.  Following he had normal bowel movements until 2 days after colonoscopy when he noticed bright red blood from his rectum during bowel movements.  He presented to the ED on 04/19/2023, blood pressures were soft and stools positive for blood.  His hemoglobin at baseline is around 11, decreased to 8.3.  He received 1 unit of packed red blood cells, hemoglobin proved to 9.4.  He underwent repeat colonoscopy while inpatient showing a single post polypectomy ulcer in the cecum.  He had multiple clips placed at previous polypectomy.  While inpatient he was found to be orthostatic, he was given a 500 mL fluid bolus, his Lasix was held while inpatient.  He was started on midodrine 5 mg 3 times daily.  His Coreg was discontinued, he was instructed to resume Eliquis on 04/26/2023.   On 04/22/2023 he had a device check which showed 0% A-fib burden, OptiVol thoracic impedance suggested  possible fluid accumulation starting 10/3 and becoming worse during and following hospital discharge on 10/23.  Following discharge he was restarted on his furosemide 40 mg daily with ICM clinic phone appointment on 05/03/2023 to recheck fluid levels.   He was seen in clinic on 04/27/23. He felt he was improving, noted that he felt significantly better than he did the week prior.  He reported that he had stopped midodrine  as he was noting intermittent headaches and difficulty with urination.  With lower extremity edema noted on exam and thoracic impedance noted possible fluid accumulation his Lasix was increased to 40 mg in the morning and afternoon for 4 days. Patient had noted improvement in fluid status on follow up however, on 06/07/2023 he notified the office of feeling increasingly short of breath with minimal exertion, tired with small amount of leg swelling and a 3 to 4 pound weight gain over the last couple of weeks.  His weight was 209 pounds, prior weight at office visit 204 pounds.  Patient reported he been taking furosemide 40 mg twice a day for 4 days with no relief of symptoms.  OptiVol suggested possible fluid accumulation since 11/15.  Patient completed Furoscix for one dose and reported improvement.   Patient was last in clinic on 09/03/2023 by Dr. [name].  Patient had recently returned from [place], was planning to undergo sigmoidoscopy with Dr. [name].  Patient reported he had some shortness of breath and still gets weak and fatigued with exertion.  Patient reported that his shortness of breath had not been improving in the prior few days.  Patient was advised that he could proceed with sigmoidoscopy acceptable risk.  On 09/21/2023 patient notified the office that his procedure had been canceled, he noted that his shortness of breath had worsened and was seen by his PCP who noted that he had a good amount of fluid in 1 lung and some of the other.  Patient's morning Lasix was doubled and he was taking an additional pill in the afternoon.  Patient was last seen in clinic on 09/29/2023, he reported increased shortness of breath, he had increased his Lasix with some improvement in symptoms.  However he continued to endorse dyspnea with exertion and increased lower extremity edema and some mild orthopnea.  Patient was started on Furoscix for 3 doses.  Patient's ICM monitoring on/12/2023 suggested normal fluid levels since  3/26 with exception of possible fluid accumulation from 3/18 through 3/25.  Today he presents for follow-up.  He reports that he  Chronic systolic heart failure: EF 28% by CT MRI in 12/2021, CRT-D inserted in 2023.  Echo on 02/19/2023 indicated LVEF of 30 to 35%, LV with moderately decreased function, LV demonstrating regional wall motion abnormalities, LV internal cavity size is moderately dilated, grade 3 diastolic dysfunction.  RV systolic function was mildly reduced.  There was severely elevated pulmonary artery systolic pressure.  On 10/24 device check showed 0% A-fib burden, OptiVol thoracic impedance suggested possible fluid accumulation starting 10/3 and becoming worse during and following hospital discharge on 10/23. Coreg was discontinued during admission for low blood pressure.  On 06/07/23 he noted increased shortness of breath, fatigue and lower extremity edema, he noted a 3 to 4 lb weight gain in the weeks prior. He was started on Furiosx and completed three injections with significant improvement.  Seen in clinic by Dr. [name] at the beginning of March and was doing well overall, OptiVol thoracic impedance suggested improvement in fluid accumulation.   Patient had  chest x-ray completed by his PCP that indicated pleural effusions per report.  At office visit on 4/2 patient reported increased shortness of breath, mild orthopnea and increased lower extremity edema.  Patient was started on Furosci for 2-3 doses.  Encourage fluids and salt restriction.  Continue Jardiance 25 mg daily. Atrial fibrillation:Underwent DCCV in 04/2019 and 09/2022. Following cardioversion in April 2024 his ICD transmission suggested he had an early return to A-fib after. Underwent amiodarone loading and DCCV on 12/23/2022. On 01/19/23 he underwent afib ablation with Dr. [name]. Amiodarone discontinued in November, 2024.  Patient denies any palpitations or feeling of increased heart rates.  Continue Eliquis 5 mg twice daily, he  denies any bleeding problems. GI bleeding/chronic anticoagulation: Patient had a colonoscopy on 04/17/2023, admitted with BR B PR on 10/20 and/24.  He received 1 unit PRBC and IV fluids in setting of hypotension.  Underwent repeat colonoscopy with further clips placed.  Patient denied any further bleeding.  He was restarted on Eliquis 5 mg twice daily. Patient was to undergo colonoscopy at the end of March, currently pending given fluid status. CAD: S/p CABG in 2013.  Last heart catheterization on 11/2021 indicated occluded SVG to PDA and SVG to PLA, patent LIMA to LAD.  Recommended to treat medically. Stable with no anginal symptoms. No indication for ischemic evaluation.   No ASA given need for Eliquis. Hypotension: Blood pressure today Will continue midodrine as needed for systolic pressure less than 95. Hyperlipidemia: On rosuvastatin 10 mg every other day. OSA on BiPAP: Patient reports nightly compliance. Severe MR: Noted to be mild on echo on 02/19/2023.  Will continue monitoring with repeat echos.      Labwork independently reviewed:   ROS: .   *** denies chest pain, shortness of breath, lower extremity edema, fatigue, palpitations, melena, hematuria, hemoptysis, diaphoresis, weakness, presyncope, syncope, orthopnea, and PND.  All other systems are reviewed and otherwise negative.  Studies Reviewed: Marland Kitchen    EKG:  EKG is ordered today, personally reviewed, demonstrating ***     CV Studies: Cardiac studies reviewed are outlined and summarized above. Otherwise please see EMR for full report. Cardiac Studies & Procedures   ______________________________________________________________________________________________ CARDIAC CATHETERIZATION  CARDIAC CATHETERIZATION 12/15/2021  Narrative 1.  Severe native coronary artery disease with patency of the left main, total occlusion of the LAD just after the first diagonal, multiple severe calcific stenoses throughout the entirety of the  circumflex distribution, and moderate stenoses in the RCA 2.  Status post CABG with continued patency of the LIMA to LAD, and interval occlusion of the SVG to PDA and SVG to PLA 3.  Normal LVEDP  Heart team review, consider medical therapy versus complex atherectomy and intervention of the left circumflex which would involve stenting back into the left main and covering a long segment throughout the circumflex in order to treat multiple severe calcific lesions.  Note that the patient had akinesis of the inferior and posterior walls on his echo and hypokinesis of the anterior wall segments.  He does not have angina.  Might be appropriate to perform a viability study.  Will discuss his case with Dr. [name].  Findings Coronary Findings Diagnostic  Dominance: Right  Left Main Ost LM to LM lesion is 30% stenosed. The lesion is discrete. The lesion is moderately calcified.  Left Anterior Descending Vessel is small. Ost LAD to Prox LAD lesion is 50% stenosed. Mid LAD lesion is 100% stenosed. The lesion is severely calcified.  Left Circumflex Vessel is small.  The entire circumflex vessel is severely calcified.  There is high-grade 95% stenosis at the ostium, severe eccentric 95% stenosis in the proximal vessel, severe 90% stenosis in the mid vessel, and severe 90% stenosis in the distal vessel just before it branches into 2 OM&#39;s Ost Cx lesion is 95% stenosed. The lesion is calcified. Prox Cx to Mid Cx lesion is 90% stenosed. The lesion is eccentric. The lesion is severely calcified. Mid Cx to Dist Cx lesion is 90% stenosed. The lesion is discrete.  Right Coronary Artery Prox RCA lesion is 60% stenosed. The lesion is type C and discrete. The lesion is severely calcified. Dist RCA lesion is 50% stenosed.  Right Posterior Descending Artery Vessel is small in size.  Right Posterior Atrioventricular Artery Vessel is small in size. RPAV lesion is 100% stenosed.  Graft To RPDA And is moderate  in size.  The graft exhibits minimal luminal irregularities. Origin to Prox Graft lesion is 100% stenosed.  Graft To RPAV And is small.  The graft exhibits no disease. Origin to Prox Graft lesion is 100% stenosed.  LIMA LIMA Graft To Dist LAD LIMA and is moderate in size.  The graft exhibits no disease. There is competitive flow. The LIMA to LAD graft is widely patent with no stenosis.  This fills the entirety of the LAD as it is occluded just beyond the first diagonal branch  Intervention  No interventions have been documented.   CARDIAC CATHETERIZATION  CARDIAC CATHETERIZATION 09/13/2015  Narrative  SVG was injected is moderate in size. numerous valves  The graft exhibits minimal luminal irregularities.  SVG was injected is small, and is anatomically normal.  Prox RCA lesion, 85% stenosed.  Prox LAD lesion, 100% stenosed.  Pedicle LIMA was injected is moderate in size, and is anatomically normal.  There is competitive flow.  Mid Cx to Dist Cx lesion, 30% stenosed.  Post Atrio lesion, 100% stenosed.  Ost LM to LM lesion, 30% stenosed.  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 Patent LIMA-LAD, SVG-PDA and SVG-PLB grafts with TIMI III flow and good distal runoff. LVEDP = 16 mmHg. Moderately reduced CO by Fick of 4.8 L/min and CI of 2 L/min, RA of 6 and PA mean of 29 mmHg. Compensated right and left heart pressures. LVEF 30-35% with global hypokinesis and inferior akinesis.  [name], MD, [facility] Attending Cardiologist [facility]  Findings Coronary Findings Diagnostic  Dominance: Right  Left Main Moderately Calcified discrete.  Left Anterior Descending . Vessel is small. Severely Calcified.  Left Circumflex . Vessel is small. Discrete.  Right Coronary Artery The lesion is type C Discrete.  Right Posterior Descending Artery The vessel is small in size.  Right Posterior Atrioventricular Artery The vessel is  small in size.  Single Graft Graft To RPDA SVG was injected is moderate in size. numerous valves  The graft exhibits minimal luminal irregularities.  Single Graft Graft To RPAV SVG was injected is small, and is anatomically normal.  LIMA LIMA Graft To Dist LAD LIMA was injected is moderate in size, and is anatomically normal. There is competitive flow.  Intervention  No interventions have been documented.   STRESS TESTS  NM MYOCAR MULTI W/SPECT W 10/20/2010   ECHOCARDIOGRAM  ECHOCARDIOGRAM COMPLETE 02/19/2023  Narrative ECHOCARDIOGRAM REPORT    Patient Name:   [name] [name] Date of Exam: 02/19/2023 Medical Rec #:  [id number]      Height:       74.0  in Accession #:    [id number]     Weight:       205.2 lb Date of Birth:  1946-04-26     BSA:          2.197 m Patient Age:    5 years       BP:           110/59 mmHg Patient Gender: M              HR:           67 bpm. [facility]  Procedure: 2D Echo, Cardiac Doppler and Color Doppler  Indications:    Congestive Heart Failure I50.9  History:        Patient has prior history of Echocardiogram examinations, most recent 11/10/2022. Cardiomyopathy and CHF, CAD, Prior CABG and Defibrillator, Stroke, Arrythmias:Atrial Fibrillation; Risk Factors:Diabetes, Hypertension and Dyslipidemia.  Sonographer:    Celesta Gentile RCS Referring Phys: [phone] KENNETH C [name]  IMPRESSIONS   1. Left ventricular ejection fraction, by estimation, is 30 to 35%. The left ventricle has moderately decreased function. The left ventricle demonstrates regional wall motion abnormalities (see scoring diagram/findings for description). The left ventricular internal cavity size was moderately dilated. There is mild asymmetric left ventricular hypertrophy of the basal segment. Left ventricular diastolic parameters are consistent with Grade III diastolic dysfunction (restrictive). 2. Right ventricular systolic function is mildly reduced. The right  ventricular size is normal. There is severely elevated pulmonary artery systolic pressure. The estimated right ventricular systolic pressure is 60.1 mmHg. 3. Left atrial size was mildly dilated. 4. Right atrial size was mildly dilated. 5. The mitral valve is abnormal with restricted posterior leaflet. Mild mitral valve regurgitation. 6. Tricuspid valve regurgitation is mild to moderate. 7. The aortic valve is tricuspid. Aortic valve regurgitation is not visualized. 8. The inferior vena cava is dilated in size with >50% respiratory variability, suggesting right atrial pressure of 8 mmHg.  Comparison(s): Prior images reviewed side by side. LVEF 30-35% with wall motion abnormalities consistent with ischemic cardiomyopathy. Mild mitral regurgitation. Severely elevated estimated RVSP of 60 mmHg.  FINDINGS Left Ventricle: Left ventricular ejection fraction, by estimation, is 30 to 35%. The left ventricle has moderately decreased function. The left ventricle demonstrates regional wall motion abnormalities. The left ventricular internal cavity size was moderately dilated. There is mild asymmetric left ventricular hypertrophy of the basal segment. Left ventricular diastolic parameters are consistent with Grade III diastolic dysfunction (restrictive).   LV Wall Scoring: The antero-lateral wall and posterior wall are akinetic. The mid and distal anterior septum, inferior septum, apical lateral segment, and apex are hypokinetic. The basal anteroseptal segment is normal.  Right Ventricle: The right ventricular size is normal. No increase in right ventricular wall thickness. Right ventricular systolic function is mildly reduced. There is severely elevated pulmonary artery systolic pressure. The tricuspid regurgitant velocity is 3.61 m/s, and with an assumed right atrial pressure of 8 mmHg, the estimated right ventricular systolic pressure is 60.1 mmHg.  Left Atrium: Left atrial size was mildly  dilated.  Right Atrium: Right atrial size was mildly dilated.  Pericardium: There is no evidence of pericardial effusion.  Mitral Valve: The mitral valve is abnormal. Mild mitral valve regurgitation. MV peak gradient, 7.8 mmHg. The mean mitral valve gradient is 2.0 mmHg.  Tricuspid Valve: The tricuspid valve is grossly normal. Tricuspid valve regurgitation is mild to moderate.  Aortic Valve: The aortic valve is tricuspid. There is mild aortic valve annular calcification. Aortic  valve regurgitation is not visualized.  Pulmonic Valve: The pulmonic valve was grossly normal. Pulmonic valve regurgitation is trivial.  Aorta: The aortic root is normal in size and structure.  Venous: The inferior vena cava is dilated in size with greater than 50% respiratory variability, suggesting right atrial pressure of 8 mmHg.  IAS/Shunts: No atrial level shunt detected by color flow Doppler.  Additional Comments: A device lead is visualized.   LEFT VENTRICLE PLAX 2D LVIDd:         6.40 cm      Diastology LVIDs:         5.30 cm      LV e' medial:    3.06 cm/s LV PW:         0.80 cm      LV E/e' medial:  37.9 LV IVS:        1.20 cm      LV e' lateral:   5.12 cm/s LVOT diam:     2.00 cm      LV E/e' lateral: 22.7 LV SV:         57 LV SV Index:   26 LVOT Area:     3.14 cm  LV Volumes (MOD) LV vol d, MOD A2C: 163.0 ml LV vol d, MOD A4C: 167.0 ml LV vol s, MOD A2C: 101.0 ml LV vol s, MOD A4C: 111.0 ml LV SV MOD A2C:     62.0 ml LV SV MOD A4C:     167.0 ml LV SV MOD BP:      60.2 ml  RIGHT VENTRICLE RV S prime:     7.25 cm/s TAPSE (M-mode): 1.9 cm  LEFT ATRIUM              Index        RIGHT ATRIUM           Index LA diam:        4.70 cm  2.14 cm/m   RA Area:     24.60 cm LA Vol (A2C):   103.0 ml 46.88 ml/m  RA Volume:   75.10 ml  34.18 ml/m LA Vol (A4C):   56.4 ml  25.67 ml/m LA Biplane Vol: 78.1 ml  35.54 ml/m AORTIC VALVE LVOT Vmax:   75.70 cm/s LVOT Vmean:  48.500 cm/s LVOT  VTI:    0.181 m  AORTA Ao Root diam: 3.30 cm  MITRAL VALVE                TRICUSPID VALVE MV Area (PHT): 4.31 cm     TR Peak grad:   52.1 mmHg MV Area VTI:   1.52 cm     TR Vmax:        361.00 cm/s MV Peak grad:  7.8 mmHg MV Mean grad:  2.0 mmHg     SHUNTS MV Vmax:       1.40 m/s     Systemic VTI:  0.18 m MV Vmean:      54.4 cm/s    Systemic Diam: 2.00 cm MV Decel Time: 176 msec MR Peak grad: 82.8 mmHg MR Mean grad: 49.0 mmHg MR Vmax:      455.00 cm/s MR Vmean:     318.0 cm/s MV E velocity: 116.00 cm/s MV A velocity: 41.10 cm/s MV E/A ratio:  2.82  Nona Dell MD Electronically signed by Nona Dell MD Signature Date/Time: 02/19/2023/10:45:43 AM    Final        CARDIAC MRI  MR CARDIAC MORPHOLOGY W  WO CONTRAST 01/19/2022  Narrative CLINICAL DATA:  53M with CAD s/p CABG x3 (LIMa-LAD, SVG-PDA, SVG-PLA). Echo 12/05/21 showed EF 30-35%. Cath 12/15/21 showed patient LIMA-LAD, occluded SVG-PDA and SVG-PLA. Evaluate viability.  EXAM: CARDIAC MRI  TECHNIQUE: The patient was scanned on a 1.5 Tesla Siemens magnet. A dedicated cardiac coil was used. Functional imaging was done using Fiesta sequences. 2,3, and 4 chamber views were done to assess for RWMA's. Modified Simpson's rule using a short axis stack was used to calculate an ejection fraction on a dedicated work [employer]. The patient received 10 cc of Gadavist. After 10 minutes inversion recovery sequences were used to assess for infiltration and scar tissue.  CONTRAST:  10 cc  of Gadavist  FINDINGS: Left ventricle:  -Severe dilatation  -Severe systolic dysfunction. Thinning/akinesis of lateral and inferior walls. Lateral wall and apical inferior wall measures <4.75mm in thickness, suggesting nonviability.  -Elevated ECV (38%)  -No LGE  LV EF: 28% (Normal 56-78%)  Absolute volumes:  LV EDV: (Normal 77-195 mL)  LV ESV: (Normal 19-72 mL)  LV SV: 95mL (Normal  51-133 mL)  CO: 6.0L/min (Normal 2.8-8.8 L/min)  Indexed volumes:  LV EDV: 140mL/sq-m (Normal 47-92 mL/sq-m)  LV ESV: 162mL/sq-m (Normal 13-30 mL/sq-m)  LV SV: 34mL/sq-m (Normal 32-62 mL/sq-m)  CI: 2.7L/min/sq-m (Normal 1.7-4.2 L/min/sq-m)  Right ventricle: Mild dilatation with mild systolic dysfunction  RV EF:  16% (Normal 47-74%)  Absolute volumes:  RV EDV: (Normal 88-227 mL)  RV ESV: (Normal 23-103 mL)  RV SV: 96mL (Normal 52-138 mL)  CO: 6.1L/min (Normal 2.8-8.8 L/min)  Indexed volumes:  RV EDV: 1102mL/sq-m (Normal 55-105 mL/sq-m)  RV ESV: 85mL/sq-m (Normal 15-43 mL/sq-m)  RV SV: 44mL/sq-m (Normal 32-64 mL/sq-m)  CI: 2.7L/min/sq-m (Normal 1.7-4.2 L/min/sq-m)  Left atrium: Mild enlargement  Right atrium: Mild enlargement  Mitral valve: Moderate mitral regurgitation (regurgitant fraction 27%)  Aortic valve: No regurgitation  Tricuspid valve: Moderate tricuspid regurgitation (regurgitant fraction 25%)  Pulmonic valve: No regurgitation  Aorta: Normal proximal ascending aorta  Pericardium: Normal  IMPRESSION: 1. Severe LV dilatation with severe systolic dysfunction (EF 28%). Thinning/akinesis of inferior and lateral walls.  2.  Mild RV dilatation with mild systolic dysfunction (EF 40%)  3. While no late gadolinium enhancement is seen, there is severe thinning (less than 4.24mm) in the apical to basal lateral wall and apical inferior wall, suggesting nonviability in these regions.  4.  Moderate mitral regurgitation (regurgitant fraction 27%)  5.  Moderate tricuspid regurgitation (regurgitant fraction 25%)   Electronically Signed By: Epifanio Lesches M.D. On: 01/20/2022 23:31   ______________________________________________________________________________________________       Current Reported Medications:.    No outpatient medications have been marked as taking for the 10/07/23 encounter (Appointment) with [name],  NP.    Physical Exam:    VS:  There were no vitals taken for this visit.   Wt Readings from Last 3 Encounters:  09/29/23 212 lb 6.4 oz (96.3 kg)  09/28/23 212 lb (96.2 kg)  09/20/23 211 lb (95.7 kg)    GEN: Well nourished, well developed in no acute distress NECK: No JVD; No carotid bruits CARDIAC: ***RRR, no murmurs, rubs, gallops RESPIRATORY:  Clear to auscultation without rales, wheezing or rhonchi  ABDOMEN: Soft, non-tender, non-distended EXTREMITIES:  No edema; No acute deformity     Asessement and Plan:.     ***     Disposition: F/u with ***  Signed, [name], NP

## 2023-10-07 ENCOUNTER — Ambulatory Visit (HOSPITAL_COMMUNITY)
Admission: RE | Admit: 2023-10-07 | Discharge: 2023-10-07 | Disposition: A | Discharge status: Home / Self Care | Source: Ambulatory Visit | Attending: Cardiology | Admitting: Cardiology

## 2023-10-07 ENCOUNTER — Ambulatory Visit: Admitting: Cardiology

## 2023-10-07 ENCOUNTER — Encounter: Payer: Self-pay | Admitting: Cardiology

## 2023-10-07 VITALS — BP 100/62 | HR 74 | Ht 73.0 in | Wt 202.8 lb

## 2023-10-07 DIAGNOSIS — Z95810 Presence of automatic (implantable) cardiac defibrillator: Secondary | ICD-10-CM | POA: Diagnosis not present

## 2023-10-07 DIAGNOSIS — I255 Ischemic cardiomyopathy: Secondary | ICD-10-CM | POA: Diagnosis not present

## 2023-10-07 DIAGNOSIS — I4811 Longstanding persistent atrial fibrillation: Secondary | ICD-10-CM | POA: Insufficient documentation

## 2023-10-07 DIAGNOSIS — D6869 Other thrombophilia: Secondary | ICD-10-CM | POA: Insufficient documentation

## 2023-10-07 DIAGNOSIS — R0602 Shortness of breath: Secondary | ICD-10-CM

## 2023-10-07 DIAGNOSIS — I5022 Chronic systolic (congestive) heart failure: Secondary | ICD-10-CM | POA: Insufficient documentation

## 2023-10-07 DIAGNOSIS — J9811 Atelectasis: Secondary | ICD-10-CM | POA: Diagnosis not present

## 2023-10-07 DIAGNOSIS — G4733 Obstructive sleep apnea (adult) (pediatric): Secondary | ICD-10-CM

## 2023-10-07 DIAGNOSIS — I4819 Other persistent atrial fibrillation: Secondary | ICD-10-CM | POA: Insufficient documentation

## 2023-10-07 DIAGNOSIS — Z951 Presence of aortocoronary bypass graft: Secondary | ICD-10-CM | POA: Insufficient documentation

## 2023-10-07 DIAGNOSIS — J90 Pleural effusion, not elsewhere classified: Secondary | ICD-10-CM | POA: Insufficient documentation

## 2023-10-07 MED ORDER — TORSEMIDE 20 MG PO TABS: 20.0000 mg | ORAL_TABLET | Freq: Every day | ORAL | 3 refills | Status: DC

## 2023-10-07 NOTE — Patient Instructions (Addendum)
 Medication Instructions:  Stop Lasix  Begin Torsemide 20mg . Take one tablet daily.   *If you need a refill on your cardiac medications before your next appointment, please call your pharmacy*   Lab Work: Return in 1 week for labs. BMET If you have labs (blood work) drawn today and your tests are completely normal, you will receive your results only by: MyChart Message (if you have MyChart) OR A paper copy in the mail If you have any lab test that is abnormal or we need to change your treatment, we will call you to review the results.   Testing/Procedures: A chest x-ray takes a picture of the organs and structures inside the chest, including the heart, lungs, and blood vessels. This test can show several things, including, whether the heart is enlarges; whether fluid is building up in the lungs; and whether pacemaker / defibrillator leads are still in place.     Follow-Up: At [facility], you and your health needs are our priority.  As part of our continuing mission to provide you with exceptional heart care, we have created designated Provider Care Teams.  These Care Teams include your primary Cardiologist (physician) and Advanced Practice Providers (APPs -  Physician Assistants and Nurse Practitioners) who all work together to provide you with the care you need, when you need it.  We recommend signing up for the patient portal called "MyChart".  Sign up information is provided on this After Visit Summary.  MyChart is used to connect with patients for Virtual Visits (Telemedicine).  Patients are able to view lab/test results, encounter notes, upcoming appointments, etc.  Non-urgent messages can be sent to your provider as well.   To learn more about what you can do with MyChart, go to [email or web address].    Your next appointment:   3 week(s)  Provider:   [name], NP        Other Instructions Thank you for choosing [facility]!

## 2023-10-11 ENCOUNTER — Other Ambulatory Visit: Payer: Self-pay | Admitting: Internal Medicine

## 2023-10-13 ENCOUNTER — Telehealth: Payer: Self-pay

## 2023-10-13 NOTE — Telephone Encounter (Signed)
-----   Message from Katlyn D West sent at 10/10/2023  9:16 PM EDT ----- Please let [name] know that his chest x-ray indicates a small area of fluid at the left lung.  Recommend he continue with torsemide as discussed at office visit and follow-up as planned.

## 2023-10-13 NOTE — Telephone Encounter (Signed)
 Called patient advised of below they verbalized understanding.

## 2023-10-15 ENCOUNTER — Other Ambulatory Visit: Payer: Self-pay

## 2023-10-15 MED ORDER — ROSUVASTATIN CALCIUM 10 MG PO TABS: 10.0000 mg | ORAL_TABLET | ORAL | 3 refills | Status: AC

## 2023-10-18 ENCOUNTER — Encounter

## 2023-10-18 DIAGNOSIS — I5022 Chronic systolic (congestive) heart failure: Secondary | ICD-10-CM

## 2023-10-18 DIAGNOSIS — Z95810 Presence of automatic (implantable) cardiac defibrillator: Secondary | ICD-10-CM

## 2023-10-20 ENCOUNTER — Other Ambulatory Visit: Payer: Self-pay

## 2023-10-20 DIAGNOSIS — I5022 Chronic systolic (congestive) heart failure: Secondary | ICD-10-CM

## 2023-10-20 DIAGNOSIS — Z95810 Presence of automatic (implantable) cardiac defibrillator: Secondary | ICD-10-CM

## 2023-10-20 DIAGNOSIS — R0602 Shortness of breath: Secondary | ICD-10-CM | POA: Diagnosis not present

## 2023-10-20 DIAGNOSIS — I255 Ischemic cardiomyopathy: Secondary | ICD-10-CM

## 2023-10-21 LAB — BASIC METABOLIC PANEL WITH GFR
BUN/Creatinine Ratio: 16 (ref 10–24)
BUN: 20 mg/dL (ref 8–27)
CO2: 26 mmol/L (ref 20–29)
Calcium: 9 mg/dL (ref 8.6–10.2)
Chloride: 101 mmol/L (ref 96–106)
Creatinine, Ser: 1.25 mg/dL (ref 0.76–1.27)
Glucose: 114 mg/dL — ABNORMAL HIGH (ref 70–99)
Potassium: 4.6 mmol/L (ref 3.5–5.2)
Sodium: 141 mmol/L (ref 134–144)
eGFR: 59 mL/min/{1.73_m2} — ABNORMAL LOW

## 2023-10-21 NOTE — Progress Notes (Signed)
 EPIC Encounter for ICM Monitoring  Patient Name: [name] [name] is a 78 y.o. male Date: 10/21/2023 Primary Care Physican: Orest Bio, MD Primary Cardiologist: [facility]        Electrophysiologist: Camnitz Bi-V Pacing: 91.2%         09/21/2022 Weight: 213 lbs 10/06/2022 Weight: 219 lbs  11/17/2022 Weight: 212.5 lbs 12/22/2022 Weight: 211 lbs 01/13/2023 Weight: 209 lbs 05/14/2023 Office Weight: 204 lbs 12/31/024 Weight: 207.5 lbs 07/13/2023 Weight: 212 lbs 08/04/2023 Weight: 213 lbs 10/21/2023 Weight: 200 lbs   Clinical Status Since 04-Oct-2023 Time in AT/AF  0.0 hr/day (0.0%)                                              Spoke with patient and heart failure questions reviewed.  Transmission results reviewed.  Pt reports most of the leg swelling has resolved except a little swelling on left ankle. He continues to have SOB but no worse.  He's had 2 iron infusions.    Optivol thoracic impedance continues to suggesting normal fluid levels since 3/26.      Prescribed:  Torsemide  20 mg take 1 tablet(s) (20 mg total) by mouth once a day.     Labs: 10/20/2023 Creatinine 1.25, BUN 20, Potassium 4.6, Sodium 141, GFR 59 10/04/2023 Creatinine 1.33, BUN 21, Potassium 4.2, Sodium 138, GFR 54, BNP 5,155  09/28/2023 Creatinine 1.17, BUN 23, Potassium 4.2, Sodium 140, GFR 63, BNP 5,442 09/20/2023 Creatinine 1.17, BUN 18, Potassium 3.3, Sodium 137  06/18/2023 Creatinine 1.26, BUN 19, Potassium 4.4, Sodium 142, GFR 58, BNP 428.5 05/14/2023 Creatinine 1.30, BUN 20, Potassium 4.2, Sodium 142, GFR 56 05/05/2023 Creatinine 1.30, BUN 18, Potassium 4.3, Sodium 142, GFR 56, BNP 398.0 04/27/2023 Creatinine 1.12, BUN 14, Potassium 4.5, Sodium 141, GFR 67, BNP 571.5 A complete set of results can be found in Results Review.   Recommendations:  No changes and encouraged to call if experiencing any fluid symptoms.    Copy Sent to [name] NP for review at 4/28 OV.      Follow-up plan: ICM clinic phone appointment  on 11/08/2023.   91 day device clinic remote transmission 11/01/2023.     EP/Cardiology Office Visits:   10/25/2023 with [name], NP.    Recall 09/22/2024 with Dr Lawana Pray.      Copy of ICM check sent to Dr. Lawana Pray.  3 month ICM trend: 10/18/2023.    12-14 Month ICM trend:     Almyra Jain, RN 10/21/2023 10:35 AM

## 2023-10-23 NOTE — Progress Notes (Unsigned)
 Cardiology Office Note    Date:  10/25/2023  ID:  Osiel, Dehay 1945/07/22, MRN [id number] PCP:  Orest Bio, MD  Cardiologist:  Hazle Lites, MD  Electrophysiologist:  Lei Pump, MD   Chief Complaint: Follow up for HFrEF   History of Present Illness: .    JORDELL GIBBINS is a 78 y.o. male with visit-pertinent history of CAD s/p CABG in 2013 with ischemic cardiomyopathy-EF 20-25% prior to surgery, improved to 40-45% by 10/2012, now 30-35%, mild MR, PAD, DM and PAF on chronic anticoagulation s/p ablation. Reduction in EF led to ICD insertion in 2023.      Heart catheterization in 08/2013 showed patent LIMA to LAD, SVG-PLA, SVG-PDA.  Most recent heart catheterization in 11/2021 showed patent LIMA to LAD but occlusion of both SVG grafts.  Given wall motion abnormality on echocardiogram, viability study was recommended to help make decisions regarding complex atherectomy and intervention of the left circumflex that would involve stenting back of the left main and covering the long segment throughout the circumflex to treat multiple severe calcific lesions.  Cardiac MRI was pursued and showed EF 20% and severe thinning in the apical to basal lateral wall and apical inferior wall suggesting nonviability in these regions.  Therefore complex intervention was not pursued.  He was treated medically.  Given persistent reduction in EF he underwent BiV ICD insertion CRT-D on 04/30/2022.   In March 2024 he reported worsening shortness of breath.  Device interrogation showed possible fluid accumulation in atrial fibrillation.  Diuretic was increased and EKG showed underlying A-fib with ventricularly paced rhythm and PVCs.  He presented for scheduled outpatient cardioversion on 10/16/2022 and was successfully cardioverted to a paced BiV rhythm.  He was seen by Dr. Lawana Pray on 10/27/2022 and was back in afib, he was started on amiodarone .   Cardiology was consulted in 10/2022 while patient was  admitted in setting of mechanical fall resulting in subdural hematoma 4 mm without any mass effect.  After discharge he fell again, he resumed Eliquis  on 11/24/22. On 12/23/2022 he presented for DCCV which was successful.  On 01/19/23 he underwent afib ablation with Dr. Lawana Pray, on follow up with afib clinic on 8/20 he reported no further afib. He was last seen in clinic on 02/26/23 by Dr. Maximo Spar, it was noted that he had lost 50 lbs over the past year, he had an abnormal cologuard test with his PCP.      On 04/17/23 patient underwent colonoscopy, was found to have multiple scattered diverticula as well as 3 polyps that were clipped with cold snare's.  Following he had normal bowel movements until 2 days after colonoscopy when he noticed bright red blood from his rectum during bowel movements.  He presented to the ED on 04/19/2023, blood pressures were soft and stools positive for blood.  His hemoglobin at baseline is around 11, decreased to 8.3.  He received 1 unit of packed red blood cells, hemoglobin proved to 9.4.  He underwent repeat colonoscopy while inpatient showing a single post polypectomy ulcer in the cecum.  He had multiple clips placed at previous polypectomy.  While inpatient he was found to be orthostatic, he was given a 500 mL fluid bolus, his Lasix  was held while inpatient.  He was started on midodrine  5 mg 3 times daily.  His Coreg  was discontinued, he was instructed to resume Eliquis  on 04/26/2023.   On 04/22/2023 he had a device check which showed 0% A-fib burden,  OptiVol thoracic impedance suggested possible fluid accumulation starting 10/3 and becoming worse during and following hospital discharge on 10/23.  Following discharge he was restarted on his furosemide  40 mg daily with ICM clinic phone appointment on 05/03/2023 to recheck fluid levels.   He was seen in clinic on 04/27/23. He felt he was improving, noted that he felt significantly better than he did the week prior.  He reported that he  had stopped midodrine  as he was noting intermittent headaches and difficulty with urination.  With lower extremity edema noted on exam and thoracic impedance noted possible fluid accumulation his Lasix  was increased to 40 mg in the morning and afternoon for 4 days. Patient had noted improvement in fluid status on follow up however, on 06/07/2023 he notified the office of feeling increasingly short of breath with minimal exertion, tired with small amount of leg swelling and a 3 to 4 pound weight gain over the last couple of weeks.  His weight was 209 pounds, prior weight at office visit 204 pounds.  Patient reported he been taking furosemide  40 mg twice a day for 4 days with no relief of symptoms.  OptiVol suggested possible fluid accumulation since 11/15.  Patient completed Furoscix  for one dose and reported improvement.   Patient was last in clinic on 09/03/2023 by Dr. Maximo Spar.  Patient had recently returned from [place] , was planning to undergo sigmoidoscopy with Dr. Riley Cheadle.  Patient reported he had some shortness of breath and still gets weak and fatigued with exertion.  Patient reported that his shortness of breath had not been improving in the prior few days.  Patient was advised that he could proceed with sigmoidoscopy acceptable risk.  On 09/21/2023 patient notified the office that his procedure had been canceled, he noted that his shortness of breath had worsened and was seen by his PCP who noted that he had a good amount of fluid in 1 lung and some of the other.  Patient's morning Lasix  was doubled and he was taking an additional pill in the afternoon.  Patient was seen in clinic on 09/29/2023, he reported increased shortness of breath, he had increased his Lasix  with some improvement in symptoms.  However he continued to endorse dyspnea with exertion and increased lower extremity edema and some mild orthopnea.  Patient was started on Furoscix  for 3 doses.  Patient's ICM monitoring on/12/2023 suggested normal  fluid levels since 3/26 with exception of possible fluid accumulation from 3/18 through 3/25.  Patient was seen in clinic on/03/2024.  He reported he was feeling significantly better.  Patient was transition to torsemide  and instructed to discontinue Lasix .   Today he presents for follow-up.  He reports that he is doing well overall.  He reports that his shortness of breath is improving, his lower extremity edema is back close to his baseline.  He notes that he has some chronic swelling in the left lower extremity that is his baseline.  Patient reports that he has had 2 iron infusions, has not felt a significant difference.  He and his daughter both note problems with ongoing fatigue, deconditioning and weight loss.  Patient's previous dry weight was considered to be around 216 pounds, now appears closer to 200 pounds.  They question if decreased appetite is potentially related to Jardiance  as they note he has been consistently losing weight since initiation.  Patient reports that he is still pending his colonoscopy, is planning to follow-up next week, given euvolemic status patient would be optimized for procedure.  Labwork independently reviewed: 10/20/2023: Sodium 141, potassium 4.6, creatinine 1.25 ROS: .   Today he denies chest pain, palpitations, melena, hematuria, hemoptysis, diaphoresis, weakness, presyncope, syncope, orthopnea, and PND.  All other systems are reviewed and otherwise negative. Studies Reviewed: Aaron Aas   EKG:  EKG is not ordered today.  CV Studies: Cardiac studies reviewed are outlined and summarized above. Otherwise please see EMR for full report. Cardiac Studies & Procedures   ______________________________________________________________________________________________ CARDIAC CATHETERIZATION  CARDIAC CATHETERIZATION 12/15/2021  Conclusion 1.  Severe native coronary artery disease with patency of the left main, total occlusion of the LAD just after the first diagonal, multiple  severe calcific stenoses throughout the entirety of the circumflex distribution, and moderate stenoses in the RCA 2.  Status post CABG with continued patency of the LIMA to LAD, and interval occlusion of the SVG to PDA and SVG to PLA 3.  Normal LVEDP  Heart team review, consider medical therapy versus complex atherectomy and intervention of the left circumflex which would involve stenting back into the left main and covering a long segment throughout the circumflex in order to treat multiple severe calcific lesions.  Note that the patient had akinesis of the inferior and posterior walls on his echo and hypokinesis of the anterior wall segments.  He does not have angina.  Might be appropriate to perform a viability study.  Will discuss his case with Dr. Maximo Spar.  Findings Coronary Findings Diagnostic  Dominance: Right  Left Main Ost LM to LM lesion is 30% stenosed. The lesion is discrete. The lesion is moderately calcified.  Left Anterior Descending Vessel is small. Ost LAD to Prox LAD lesion is 50% stenosed. Mid LAD lesion is 100% stenosed. The lesion is severely calcified.  Left Circumflex Vessel is small. The entire circumflex vessel is severely calcified.  There is high-grade 95% stenosis at the ostium, severe eccentric 95% stenosis in the proximal vessel, severe 90% stenosis in the mid vessel, and severe 90% stenosis in the distal vessel just before it branches into 2 OM&#39;s Ost Cx lesion is 95% stenosed. The lesion is calcified. Prox Cx to Mid Cx lesion is 90% stenosed. The lesion is eccentric. The lesion is severely calcified. Mid Cx to Dist Cx lesion is 90% stenosed. The lesion is discrete.  Right Coronary Artery Prox RCA lesion is 60% stenosed. The lesion is type C and discrete. The lesion is severely calcified. Dist RCA lesion is 50% stenosed.  Right Posterior Descending Artery Vessel is small in size.  Right Posterior Atrioventricular Artery Vessel is small in size. RPAV  lesion is 100% stenosed.  Graft To RPDA And is moderate in size.  The graft exhibits minimal luminal irregularities. Origin to Prox Graft lesion is 100% stenosed.  Graft To RPAV And is small.  The graft exhibits no disease. Origin to Prox Graft lesion is 100% stenosed.  LIMA LIMA Graft To Dist LAD LIMA and is moderate in size.  The graft exhibits no disease. There is competitive flow. The LIMA to LAD graft is widely patent with no stenosis.  This fills the entirety of the LAD as it is occluded just beyond the first diagonal branch  Intervention  No interventions have been documented.   CARDIAC CATHETERIZATION  CARDIAC CATHETERIZATION 09/13/2015  Conclusion  SVG was injected is moderate in size. numerous valves  The graft exhibits minimal luminal irregularities.  SVG was injected is small, and is anatomically normal.  Prox RCA lesion, 85% stenosed.  Prox LAD lesion, 100% stenosed.  Pedicle LIMA  was injected is moderate in size, and is anatomically normal.  There is competitive flow.  Mid Cx to Dist Cx lesion, 30% stenosed.  Post Atrio lesion, 100% stenosed.  Ost LM to LM lesion, 30% stenosed.  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 Patent LIMA-LAD, SVG-PDA and SVG-PLB grafts with TIMI III flow and good distal runoff. LVEDP = 16 mmHg. Moderately reduced CO by Fick of 4.8 L/min and CI of 2 L/min, RA of 6 and PA mean of 29 mmHg. Compensated right and left heart pressures. LVEF 30-35% with global hypokinesis and inferior akinesis.  Hazle Lites, MD, [facility] Attending Cardiologist [facility]  Findings Coronary Findings Diagnostic  Dominance: Right  Left Main Moderately Calcified discrete.  Left Anterior Descending . Vessel is small. Severely Calcified.  Left Circumflex . Vessel is small. Discrete.  Right Coronary Artery The lesion is type C Discrete.  Right Posterior Descending Artery The vessel is small in  size.  Right Posterior Atrioventricular Artery The vessel is small in size.  Single Graft Graft To RPDA SVG was injected is moderate in size. numerous valves  The graft exhibits minimal luminal irregularities.  Single Graft Graft To RPAV SVG was injected is small, and is anatomically normal.  LIMA LIMA Graft To Dist LAD LIMA was injected is moderate in size, and is anatomically normal. There is competitive flow.  Intervention  No interventions have been documented.   STRESS TESTS  NM MYOCAR MULTI W/SPECT W 10/20/2010   ECHOCARDIOGRAM  ECHOCARDIOGRAM COMPLETE 02/19/2023  Narrative ECHOCARDIOGRAM REPORT    Patient Name:   ARISTOTLE STELLWAGEN Date of Exam: 02/19/2023 Medical Rec #:  [id number]      Height:       74.0 in Accession #:    [id number]     Weight:       205.2 lb Date of Birth:  22-Nov-1945     BSA:          2.197 m Patient Age:    86 years       BP:           110/59 mmHg Patient Gender: M              HR:           67 bpm. Exam Location:  [facility]  Procedure: 2D Echo, Cardiac Doppler and Color Doppler  Indications:    Congestive Heart Failure I50.9  History:        Patient has prior history of Echocardiogram examinations, most recent 11/10/2022. Cardiomyopathy and CHF, CAD, Prior CABG and Defibrillator, Stroke, Arrythmias:Atrial Fibrillation; Risk Factors:Diabetes, Hypertension and Dyslipidemia.  Sonographer:    Denese Finn RCS Referring Phys: [phone] KENNETH C [name]  IMPRESSIONS   1. Left ventricular ejection fraction, by estimation, is 30 to 35%. The left ventricle has moderately decreased function. The left ventricle demonstrates regional wall motion abnormalities (see scoring diagram/findings for description). The left ventricular internal cavity size was moderately dilated. There is mild asymmetric left ventricular hypertrophy of the basal segment. Left ventricular diastolic parameters are consistent with Grade III diastolic dysfunction (restrictive). 2.  Right ventricular systolic function is mildly reduced. The right ventricular size is normal. There is severely elevated pulmonary artery systolic pressure. The estimated right ventricular systolic pressure is 60.1 mmHg. 3. Left atrial size was mildly dilated. 4. Right atrial size was mildly dilated. 5. The mitral valve is abnormal with restricted posterior leaflet. Mild mitral valve regurgitation. 6. Tricuspid valve  regurgitation is mild to moderate. 7. The aortic valve is tricuspid. Aortic valve regurgitation is not visualized. 8. The inferior vena cava is dilated in size with >50% respiratory variability, suggesting right atrial pressure of 8 mmHg.  Comparison(s): Prior images reviewed side by side. LVEF 30-35% with wall motion abnormalities consistent with ischemic cardiomyopathy. Mild mitral regurgitation. Severely elevated estimated RVSP of 60 mmHg.  FINDINGS Left Ventricle: Left ventricular ejection fraction, by estimation, is 30 to 35%. The left ventricle has moderately decreased function. The left ventricle demonstrates regional wall motion abnormalities. The left ventricular internal cavity size was moderately dilated. There is mild asymmetric left ventricular hypertrophy of the basal segment. Left ventricular diastolic parameters are consistent with Grade III diastolic dysfunction (restrictive).   LV Wall Scoring: The antero-lateral wall and posterior wall are akinetic. The mid and distal anterior septum, inferior septum, apical lateral segment, and apex are hypokinetic. The basal anteroseptal segment is normal.  Right Ventricle: The right ventricular size is normal. No increase in right ventricular wall thickness. Right ventricular systolic function is mildly reduced. There is severely elevated pulmonary artery systolic pressure. The tricuspid regurgitant velocity is 3.61 m/s, and with an assumed right atrial pressure of 8 mmHg, the estimated right ventricular systolic pressure is  60.1 mmHg.  Left Atrium: Left atrial size was mildly dilated.  Right Atrium: Right atrial size was mildly dilated.  Pericardium: There is no evidence of pericardial effusion.  Mitral Valve: The mitral valve is abnormal. Mild mitral valve regurgitation. MV peak gradient, 7.8 mmHg. The mean mitral valve gradient is 2.0 mmHg.  Tricuspid Valve: The tricuspid valve is grossly normal. Tricuspid valve regurgitation is mild to moderate.  Aortic Valve: The aortic valve is tricuspid. There is mild aortic valve annular calcification. Aortic valve regurgitation is not visualized.  Pulmonic Valve: The pulmonic valve was grossly normal. Pulmonic valve regurgitation is trivial.  Aorta: The aortic root is normal in size and structure.  Venous: The inferior vena cava is dilated in size with greater than 50% respiratory variability, suggesting right atrial pressure of 8 mmHg.  IAS/Shunts: No atrial level shunt detected by color flow Doppler.  Additional Comments: A device lead is visualized.   LEFT VENTRICLE PLAX 2D LVIDd:         6.40 cm      Diastology LVIDs:         5.30 cm      LV e' medial:    3.06 cm/s LV PW:         0.80 cm      LV E/e' medial:  37.9 LV IVS:        1.20 cm      LV e' lateral:   5.12 cm/s LVOT diam:     2.00 cm      LV E/e' lateral: 22.7 LV SV:         57 LV SV Index:   26 LVOT Area:     3.14 cm  LV Volumes (MOD) LV vol d, MOD A2C: 163.0 ml LV vol d, MOD A4C: 167.0 ml LV vol s, MOD A2C: 101.0 ml LV vol s, MOD A4C: 111.0 ml LV SV MOD A2C:     62.0 ml LV SV MOD A4C:     167.0 ml LV SV MOD BP:      60.2 ml  RIGHT VENTRICLE RV S prime:     7.25 cm/s TAPSE (M-mode): 1.9 cm  LEFT ATRIUM  Index        RIGHT ATRIUM           Index LA diam:        4.70 cm  2.14 cm/m   RA Area:     24.60 cm LA Vol (A2C):   103.0 ml 46.88 ml/m  RA Volume:   75.10 ml  34.18 ml/m LA Vol (A4C):   56.4 ml  25.67 ml/m LA Biplane Vol: 78.1 ml  35.54 ml/m AORTIC VALVE LVOT  Vmax:   75.70 cm/s LVOT Vmean:  48.500 cm/s LVOT VTI:    0.181 m  AORTA Ao Root diam: 3.30 cm  MITRAL VALVE                TRICUSPID VALVE MV Area (PHT): 4.31 cm     TR Peak grad:   52.1 mmHg MV Area VTI:   1.52 cm     TR Vmax:        361.00 cm/s MV Peak grad:  7.8 mmHg MV Mean grad:  2.0 mmHg     SHUNTS MV Vmax:       1.40 m/s     Systemic VTI:  0.18 m MV Vmean:      54.4 cm/s    Systemic Diam: 2.00 cm MV Decel Time: 176 msec MR Peak grad: 82.8 mmHg MR Mean grad: 49.0 mmHg MR Vmax:      455.00 cm/s MR Vmean:     318.0 cm/s MV E velocity: 116.00 cm/s MV A velocity: 41.10 cm/s MV E/A ratio:  2.82  Teddie Favre MD Electronically signed by Teddie Favre MD Signature Date/Time: 02/19/2023/10:45:43 AM    Final        CARDIAC MRI  MR CARDIAC MORPHOLOGY W WO CONTRAST 01/19/2022  Narrative CLINICAL DATA:  19M with CAD s/p CABG x3 (LIMa-LAD, SVG-PDA, SVG-PLA). Echo 12/05/21 showed EF 30-35%. Cath 12/15/21 showed patient LIMA-LAD, occluded SVG-PDA and SVG-PLA. Evaluate viability.  EXAM: CARDIAC MRI  TECHNIQUE: The patient was scanned on a 1.5 Tesla Siemens magnet. A dedicated cardiac coil was used. Functional imaging was done using Fiesta sequences. 2,3, and 4 chamber views were done to assess for RWMA's. Modified Simpson's rule using a short axis stack was used to calculate an ejection fraction on a dedicated work [employer]. The patient received 10 cc of Gadavist . After 10 minutes inversion recovery sequences were used to assess for infiltration and scar tissue.  CONTRAST:  10 cc  of Gadavist   FINDINGS: Left ventricle:  -Severe dilatation  -Severe systolic dysfunction. Thinning/akinesis of lateral and inferior walls. Lateral wall and apical inferior wall measures <4.57mm in thickness, suggesting nonviability.  -Elevated ECV (38%)  -No LGE  LV EF: 28% (Normal 56-78%)  Absolute volumes:  LV EDV: (Normal 77-195 mL)  LV ESV:  (Normal 19-72 mL)  LV SV: 95mL (Normal 51-133 mL)  CO: 6.0L/min (Normal 2.8-8.8 L/min)  Indexed volumes:  LV EDV: 19mL/sq-m (Normal 47-92 mL/sq-m)  LV ESV: 176mL/sq-m (Normal 13-30 mL/sq-m)  LV SV: 49mL/sq-m (Normal 32-62 mL/sq-m)  CI: 2.7L/min/sq-m (Normal 1.7-4.2 L/min/sq-m)  Right ventricle: Mild dilatation with mild systolic dysfunction  RV EF:  46% (Normal 47-74%)  Absolute volumes:  RV EDV: (Normal 88-227 mL)  RV ESV: (Normal 23-103 mL)  RV SV: 96mL (Normal 52-138 mL)  CO: 6.1L/min (Normal 2.8-8.8 L/min)  Indexed volumes:  RV EDV: 159mL/sq-m (Normal 55-105 mL/sq-m)  RV ESV: 13mL/sq-m (Normal 15-43 mL/sq-m)  RV SV: 74mL/sq-m (Normal 32-64 mL/sq-m)  CI:  2.7L/min/sq-m (Normal 1.7-4.2 L/min/sq-m)  Left atrium: Mild enlargement  Right atrium: Mild enlargement  Mitral valve: Moderate mitral regurgitation (regurgitant fraction 27%)  Aortic valve: No regurgitation  Tricuspid valve: Moderate tricuspid regurgitation (regurgitant fraction 25%)  Pulmonic valve: No regurgitation  Aorta: Normal proximal ascending aorta  Pericardium: Normal  IMPRESSION: 1. Severe LV dilatation with severe systolic dysfunction (EF 28%). Thinning/akinesis of inferior and lateral walls.  2.  Mild RV dilatation with mild systolic dysfunction (EF 40%)  3. While no late gadolinium enhancement is seen, there is severe thinning (less than 4.38mm) in the apical to basal lateral wall and apical inferior wall, suggesting nonviability in these regions.  4.  Moderate mitral regurgitation (regurgitant fraction 27%)  5.  Moderate tricuspid regurgitation (regurgitant fraction 25%)   Electronically Signed By: Carson Clara M.D. On: 01/20/2022 23:31   ______________________________________________________________________________________________       Current Reported Medications:.    Current Meds  Medication Sig   acetaminophen  (TYLENOL ) 500 MG  tablet Take 500-1,000 mg by mouth every 6 (six) hours as needed (for pain.).   apixaban  (ELIQUIS ) 5 MG TABS tablet Take 1 tablet (5 mg total) by mouth 2 (two) times daily.   carvedilol  (COREG ) 3.125 MG tablet Take 1 tablet by mouth twice daily   gabapentin  (NEURONTIN ) 300 MG capsule Take 600 mg by mouth at bedtime.   JARDIANCE  25 MG TABS tablet Take 1 tablet by mouth once daily   metFORMIN  (GLUCOPHAGE ) 1000 MG tablet Take 1,000 mg by mouth 2 times daily at 12 noon and 4 pm.   metFORMIN  (GLUCOPHAGE -XR) 500 MG 24 hr tablet Take 1,000 mg by mouth 2 (two) times daily.   midodrine  (PROAMATINE ) 5 MG tablet Take 1 tablet (5 mg total) by mouth as needed. For systolic blood pressure <95   ONETOUCH VERIO test strip 1 each daily.   polyethylene glycol (MIRALAX  / GLYCOLAX ) 17 g packet Take 17 g by mouth daily as needed for mild constipation.   rosuvastatin  (CRESTOR ) 10 MG tablet Take 1 tablet (10 mg total) by mouth every other day.   torsemide  (DEMADEX ) 20 MG tablet Take 1 tablet (20 mg total) by mouth daily.   Physical Exam:    VS:  BP 106/68   Pulse 72   Ht 6\' 2"  (1.88 m)   Wt 201 lb (91.2 kg)   SpO2 97%   BMI 25.81 kg/m    Wt Readings from Last 3 Encounters:  10/25/23 201 lb (91.2 kg)  10/07/23 202 lb 12.8 oz (92 kg)  09/29/23 212 lb 6.4 oz (96.3 kg)    GEN: Well nourished, well developed in no acute distress NECK: No JVD; No carotid bruits CARDIAC: RRR, no murmurs, rubs, gallops RESPIRATORY:  Clear to auscultation without rales, wheezing or rhonchi  ABDOMEN: Soft, non-tender, non-distended EXTREMITIES:  No edema; No acute deformity     Asessement and Plan:.    Chronic systolic heart failure: EF 28% by CT MRI in 12/2021, CRT-D inserted in 2023.  Echo on 02/19/2023 indicated LVEF of 30 to 35%, LV with moderately decreased function, LV demonstrating regional wall motion abnormalities, LV internal cavity size is moderately dilated, grade 3 diastolic dysfunction.  RV systolic function was mildly  reduced.  There was severely elevated pulmonary artery systolic pressure. On 06/07/23 he noted increased shortness of breath, fatigue and lower extremity edema, he noted a 3 to 4 lb weight gain in the weeks prior. He was started on Furiosx and completed three injections with significant improvement.  Seen  in clinic by Dr. Maximo Spar at the beginning of March and was doing well overall, OptiVol thoracic impedance suggested improvement in fluid accumulation.  Patient had chest x-ray completed by his PCP that indicated pleural effusions per report.  At office visit on 4/2 patient reported increased shortness of breath, mild orthopnea and increased lower extremity edema.  Patient was started on Furoscix  for 3 doses.  At last office visit he was transitioned from Lasix  to torsemide .  Today patient reports that he is doing well, shortness of breath continues to improve as well as lower extremity edema, has mild lower extremity edema of the left lower extremity, notes this is overall chronic.  He appears euvolemic and well compensated on exam.  Patient reports that his dry weight now appears to be around 200 pounds.  His daughter questions if decreased appetite and weight loss could be related to Jardiance , discussed decreasing dose to 10 mg daily however given stable weights and euvolemic status through shared demonstrated making elected to defer decreasing dosage at this time as he is currently pending colonoscopy.  Following colonoscopy they would like to trial decreasing his Jardiance  to see if this improves his appetite and weight.  Encouraged patient to follow-up with physical therapy.  Encouraged patient to continue monitoring his weights at home and notify the office of weight gain of 2 to 3 pounds overnight or 5 pounds in a week, increased shortness of breath or lower extremity edema.  Discussed that patient may take an extra dose of torsemide  20 mg daily as needed for the above-mentioned.  Reviewed ED  precautions.  Atrial fibrillation: Underwent DCCV in 04/2019 and 09/2022. Following cardioversion in April 2024 his ICD transmission suggested he had an early return to A-fib after. Underwent amiodarone  loading and DCCV on 12/23/2022. On 01/19/23 he underwent afib ablation with Dr. Lawana Pray. Amiodarone  discontinued in November, 2024. Patient denies any palpitations or feeling of increased heart rates.  Continue Eliquis  5 mg twice daily, he denies any bleeding problems.   GI bleeding/chronic anticoagulation: Patient had a colonoscopy on 04/17/2023, admitted with BR B PR on 04/18/23. He received 1 unit PRBC and IV fluids in setting of hypotension. Underwent repeat colonoscopy with further clips placed. Patient denied any further bleeding.  He has received 2 iron infusions. He was restarted on Eliquis  5 mg twice daily. Patient was to undergo colonoscopy at the end of March, currently pending given fluid status.  Patient appears euvolemic and well compensated at this time, he denies any chest pain, reports his breathing is stable.  Patient optimized from a cardiac standpoint, can proceed with colonoscopy at acceptable cardiac risk.   CAD: S/p CABG in 2013.  Last heart catheterization on 11/2021 indicated occluded SVG to PDA and SVG to PLA, patent LIMA to LAD.  Recommended to treat medically. Stable with no anginal symptoms. No indication for ischemic evaluation.  No ASA given need for Eliquis .   Hypotension: Blood pressure today 106/68.  Patient has midodrine  5 mg as needed for systolic blood pressure less than 95.  Hyperlipidemia: On rosuvastatin  10 mg every other day.   OSA on Bipap: Patient reports nightly compliance.    Disposition: F/u with Gee Habig, NP in 11/2023 as scheduled.   Signed, Marsia Cino D Charo Philipp, NP

## 2023-10-25 ENCOUNTER — Ambulatory Visit: Attending: Cardiology | Admitting: Cardiology

## 2023-10-25 ENCOUNTER — Encounter: Payer: Self-pay | Admitting: Cardiology

## 2023-10-25 VITALS — BP 106/68 | HR 72 | Ht 74.0 in | Wt 201.0 lb

## 2023-10-25 DIAGNOSIS — Z951 Presence of aortocoronary bypass graft: Secondary | ICD-10-CM | POA: Insufficient documentation

## 2023-10-25 DIAGNOSIS — G4733 Obstructive sleep apnea (adult) (pediatric): Secondary | ICD-10-CM | POA: Insufficient documentation

## 2023-10-25 DIAGNOSIS — D6869 Other thrombophilia: Secondary | ICD-10-CM | POA: Diagnosis not present

## 2023-10-25 DIAGNOSIS — R0602 Shortness of breath: Secondary | ICD-10-CM | POA: Insufficient documentation

## 2023-10-25 DIAGNOSIS — I255 Ischemic cardiomyopathy: Secondary | ICD-10-CM | POA: Insufficient documentation

## 2023-10-25 DIAGNOSIS — Z95810 Presence of automatic (implantable) cardiac defibrillator: Secondary | ICD-10-CM | POA: Diagnosis not present

## 2023-10-25 DIAGNOSIS — I4819 Other persistent atrial fibrillation: Secondary | ICD-10-CM | POA: Diagnosis not present

## 2023-10-25 DIAGNOSIS — I5022 Chronic systolic (congestive) heart failure: Secondary | ICD-10-CM | POA: Diagnosis not present

## 2023-10-25 NOTE — Patient Instructions (Signed)
 Medication Instructions:  No changes *If you need a refill on your cardiac medications before your next appointment, please call your pharmacy*  Lab Work: No labs If you have labs (blood work) drawn today and your tests are completely normal, you will receive your results only by: MyChart Message (if you have MyChart) OR A paper copy in the mail If you have any lab test that is abnormal or we need to change your treatment, we will call you to review the results.  Testing/Procedures: No testing  Follow-Up: At [facility], you and your health needs are our priority.  As part of our continuing mission to provide you with exceptional heart care, our providers are all part of one team.  This team includes your primary Cardiologist (physician) and Advanced Practice Providers or APPs (Physician Assistants and Nurse Practitioners) who all work together to provide you with the care you need, when you need it.  Your next appointment:   Already scheduled  We recommend signing up for the patient portal called "MyChart".  Sign up information is provided on this After Visit Summary.  MyChart is used to connect with patients for Virtual Visits (Telemedicine).  Patients are able to view lab/test results, encounter notes, upcoming appointments, etc.  Non-urgent messages can be sent to your provider as well.   To learn more about what you can do with MyChart, go to [email or web address].

## 2023-10-26 ENCOUNTER — Telehealth: Payer: Self-pay

## 2023-10-26 NOTE — Telephone Encounter (Signed)
 Called patient advised of below they verbalized understanding No questions at this time.

## 2023-10-26 NOTE — Telephone Encounter (Signed)
-----   Message from Katlyn D West sent at 10/21/2023  1:00 PM EDT ----- Please let [name] [name] know that his kidney function is at his baseline and his electrolytes are normal. Good results! Continue current medications and follow up as planned.

## 2023-10-27 DIAGNOSIS — R5383 Other fatigue: Secondary | ICD-10-CM | POA: Diagnosis not present

## 2023-10-27 DIAGNOSIS — R531 Weakness: Secondary | ICD-10-CM | POA: Diagnosis not present

## 2023-11-01 ENCOUNTER — Ambulatory Visit: Payer: Medicare Other

## 2023-11-01 DIAGNOSIS — I4819 Other persistent atrial fibrillation: Secondary | ICD-10-CM

## 2023-11-01 DIAGNOSIS — I5022 Chronic systolic (congestive) heart failure: Secondary | ICD-10-CM

## 2023-11-02 ENCOUNTER — Ambulatory Visit (INDEPENDENT_AMBULATORY_CARE_PROVIDER_SITE_OTHER): Admitting: Gastroenterology

## 2023-11-02 ENCOUNTER — Encounter: Payer: Self-pay | Admitting: *Deleted

## 2023-11-02 ENCOUNTER — Encounter: Payer: Self-pay | Admitting: Gastroenterology

## 2023-11-02 VITALS — BP 98/60 | HR 59 | Temp 98.5°F | Ht 74.0 in | Wt 203.8 lb

## 2023-11-02 DIAGNOSIS — C187 Malignant neoplasm of sigmoid colon: Secondary | ICD-10-CM

## 2023-11-02 DIAGNOSIS — R531 Weakness: Secondary | ICD-10-CM | POA: Diagnosis not present

## 2023-11-02 DIAGNOSIS — D649 Anemia, unspecified: Secondary | ICD-10-CM | POA: Diagnosis not present

## 2023-11-02 DIAGNOSIS — R5383 Other fatigue: Secondary | ICD-10-CM | POA: Diagnosis not present

## 2023-11-02 LAB — CUP PACEART REMOTE DEVICE CHECK
Battery Remaining Longevity: 83 mo
Battery Voltage: 3 V
Brady Statistic AP VP Percent: 27.24 %
Brady Statistic AP VS Percent: 0.08 %
Brady Statistic AS VP Percent: 71.91 %
Brady Statistic AS VS Percent: 0.76 %
Brady Statistic RA Percent Paced: 24.85 %
Brady Statistic RV Percent Paced: 91.02 %
Date Time Interrogation Session: 20250505033622
HighPow Impedance: 48 Ohm
Implantable Lead Connection Status: 753985
Implantable Lead Connection Status: 753985
Implantable Lead Connection Status: 753985
Implantable Lead Implant Date: 20231102
Implantable Lead Implant Date: 20231102
Implantable Lead Implant Date: 20231102
Implantable Lead Location: 753858
Implantable Lead Location: 753859
Implantable Lead Location: 753860
Implantable Lead Model: 4598
Implantable Lead Model: 5076
Implantable Lead Model: 6935
Implantable Pulse Generator Implant Date: 20231102
Lead Channel Impedance Value: 128.074
Lead Channel Impedance Value: 136.276
Lead Channel Impedance Value: 136.276
Lead Channel Impedance Value: 141.867
Lead Channel Impedance Value: 152 Ohm
Lead Channel Impedance Value: 247 Ohm
Lead Channel Impedance Value: 247 Ohm
Lead Channel Impedance Value: 266 Ohm
Lead Channel Impedance Value: 304 Ohm
Lead Channel Impedance Value: 304 Ohm
Lead Channel Impedance Value: 342 Ohm
Lead Channel Impedance Value: 399 Ohm
Lead Channel Impedance Value: 437 Ohm
Lead Channel Impedance Value: 437 Ohm
Lead Channel Impedance Value: 456 Ohm
Lead Channel Impedance Value: 456 Ohm
Lead Channel Impedance Value: 494 Ohm
Lead Channel Impedance Value: 494 Ohm
Lead Channel Pacing Threshold Amplitude: 0.5 V
Lead Channel Pacing Threshold Amplitude: 1 V
Lead Channel Pacing Threshold Amplitude: 1 V
Lead Channel Pacing Threshold Pulse Width: 0.4 ms
Lead Channel Pacing Threshold Pulse Width: 0.4 ms
Lead Channel Pacing Threshold Pulse Width: 0.4 ms
Lead Channel Sensing Intrinsic Amplitude: 1.5 mV
Lead Channel Sensing Intrinsic Amplitude: 1.5 mV
Lead Channel Sensing Intrinsic Amplitude: 6.75 mV
Lead Channel Sensing Intrinsic Amplitude: 6.75 mV
Lead Channel Setting Pacing Amplitude: 1.5 V
Lead Channel Setting Pacing Amplitude: 2 V
Lead Channel Setting Pacing Amplitude: 2 V
Lead Channel Setting Pacing Pulse Width: 0.4 ms
Lead Channel Setting Pacing Pulse Width: 0.4 ms
Lead Channel Setting Sensing Sensitivity: 0.3 mV
Zone Setting Status: 755011

## 2023-11-02 NOTE — Progress Notes (Addendum)
 GI Office Note    Referring Provider: Orest Bio, MD Primary Care Physician:  Orest Bio, MD  Primary Gastroenterologist: Rheba Cedar, MD   Chief Complaint   Chief Complaint  Patient presents with   Follow-up    Pt was suppose to have a colonoscopy 6 weeks ago but it got canceled and he was told to be here today    History of Present Illness   [name] [name] is a 78 y.o. male presenting today for follow up. Last seen in 08/2023. Here to reassess for candidacy for follow up sigmoidoscopy to further evaluate site of resctosigmoid polyp removed back in 03/2023. He was scheduled in 08/2023 but he was cancelled due to CHF at that time. He has since been back to see cardiology. He was last seen by then 10/25/23 and felt to be stable for sigmoidoscopy.   From last OV with Dr. 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  Saw Dr. Joyce Nixon.  Thoughtful consultation ensued.  Patient's comorbidities makes him significant and nearly prohibitive operative risk for a segmental resection.  I discussed with Dr. Andy Bannister.  We both felt all things considered to be very reasonable in this setting to just follow-up the polypectomy site and see if there is any residual polyp tissue remaining.  Unfortunately, patient developed a 1 unit post polypectomy bleed requiring additional clips by Dr. Sammi Crick few days after his index colonoscopy.  I spoke to his daughter Rolm Clos today as well the patient at length.  Unfortunately, the post polypectomy bleed after resuming Eliquis  in spite of clipping, set him back,  he has remained somewhat weak since that time;  a little bit unsteady his feet.  Uses a cane to ambulate.  Initial CEA 1.8.  Last hemoglobin 10.8 from back in November has not had any further hematochezia or melena; no upper GI tract symptoms.  He was found to have Candida esophagitis and non-H. pylori  gastritis last fall as well.  He was treated with Diflucan .   He continues to be anticoagulated with Eliquis .  Today: Presents with his significant other, [name] [name].  From a GI standpoint, bowel movements have been regular.  No blood in the stool or melena.  No abdominal pain.  No heartburn or dysphagia.  No odynophagia.  Appetite remains poor.  They are concerned about some weight loss.  They believe it may be due to his Farxiga and have discussed with cardiology about potentially reducing the dose.  Decision was made to hold off until after colonoscopy.  His weight is down about 20 pounds since April 2024 but overall stable since September of last year.  He notes his fluid status has improved.  He continues to be fairly weak.  Ambulates with a cane.  Has had some falls recently.  Not back to his baseline since prior to his initial colonoscopy.   Labs April 2025: Glucose 114, BUN 20, creatinine 1.25, sodium 141, potassium 4.02 September 2023: Hemoglobin 10.9 stable from October 2024.  MCV 77, platelets 203, CEA 1.4 down from 1.86 months ago.  CT abdomen pelvis with contrast April 20, 2023: IMPRESSION: -No bowel obstruction, free air. Metallic clips seen along the course of the colon with one at the level of the ileocecal valve in the other at the distal sigmoid colon. Please correlate with recent colonoscopy. -Scattered mesenteric haziness. There is also trace ascites and presacral fat stranding. Anasarca. -No  developing liver mass, lymph node enlargement. No mesenteric lesion. -Gallstones. -New small bilateral pleural effusions with some adjacent opacity. Recommend follow-up  EGD April 14, 2023: -Probable Candida esophagitis status post KOH brushings, KOH positive treated with Diflucan  -Abnormal gastric mucosa of uncertain significance status post biopsy, mild nonspecific reactive gastropathy, no H. pylori  Colonoscopy April 14, 2023: -Diverticulosis in the sigmoid colon and  descending colon -three 4 to 6 mm polyps at the rectosigmoid colon, ascending colon, cecum, tubular adenomas -one 20 x 10 mm polyp at the rectosigmoid colon, removed with hot snare.  Clipped and demarcated with ink.  Pathology showed single fragment of invasive moderately differentiated adenocarcinoma, other fragments of hyperplastic polyp tubular adenoma without high-grade dysplasia -Restart Eliquis  tomorrow  Colonoscopy April 18, 2023: Post polypectomy bleed - Preparation of colon was fair -two to 3 mm polyps in the transverse colon and ascending colon not removed - Single solitary post polypectomy ulcer in the cecum.  Clips were placed. - Single solitary post polypectomy ulcer in the ascending colon. - 2 clips in the rectosigmoid colon at site of previous polypectomy.  2 more clips placed at polypectomy site to prevent recurrent bleeding - Distal rectum and anal verge normal -Restart Eliquis  in 7 days  Medications   Current Outpatient Medications  Medication Sig Dispense Refill   acetaminophen  (TYLENOL ) 500 MG tablet Take 500-1,000 mg by mouth every 6 (six) hours as needed (for pain.).     apixaban  (ELIQUIS ) 5 MG TABS tablet Take 1 tablet (5 mg total) by mouth 2 (two) times daily.     carvedilol  (COREG ) 3.125 MG tablet Take 1 tablet by mouth twice daily 180 tablet 3   gabapentin  (NEURONTIN ) 300 MG capsule Take 600 mg by mouth at bedtime.     JARDIANCE  25 MG TABS tablet Take 1 tablet by mouth once daily 90 tablet 0   metFORMIN  (GLUCOPHAGE -XR) 500 MG 24 hr tablet Take 1,000 mg by mouth 2 (two) times daily.     ONETOUCH VERIO test strip 1 each daily.     rosuvastatin  (CRESTOR ) 10 MG tablet Take 1 tablet (10 mg total) by mouth every other day. 45 tablet 3   torsemide  (DEMADEX ) 20 MG tablet Take 1 tablet (20 mg total) by mouth daily. 90 tablet 3   No current facility-administered medications for this visit.    Allergies   Allergies as of 11/02/2023 - Review Complete 11/02/2023   Allergen Reaction Noted   Entresto  [sacubitril -valsartan ] Other (See Comments) 02/25/2017   Sacubitril  Other (See Comments) 10/05/2017     Past Medical History   Past Medical History:  Diagnosis Date   AICD (automatic cardioverter/defibrillator) present    Arthritis    Knee L - probably   Atrial fibrillation (HCC)    BPH (benign prostatic hyperplasia)    CAD (coronary artery disease) 07/06/2011   CHF (congestive heart failure) (HCC)    Coronary artery disease    Diabetes mellitus    Frequency of urination    Heart failure (HCC)    High cholesterol    History of nuclear stress test 09/2010   dipyridamole; mild perfusion defect due to attenuation with mild superimposed ischemia in apical septal, apical, apical inferior & apical lateral regions; rest LV enlarged in size; prominent gut uptake in infero-apical region; no significant ischemia demonstrated; low risk scan    HNP (herniated nucleus pulposus), lumbar    Hypertension    Ischemic cardiomyopathy    LV dysfunction 07/06/2011   Nocturia    Right  bundle branch block    S/P CABG (coronary artery bypass graft) 08/03/2011   x3; LIMA to LAD,, SVG to PDA, SVG to posterolateral branch of RCA; Dr. Cherylynn Cosier   Sleep apnea    sleep study 10/2010- AHI during total sleep 32.1/hr and during REM 62.3/hr (severe sleep apnea)unable to tolerate c pap   Spinal stenosis of lumbar region    Stroke [facility])    [place]     Past Surgical History   Past Surgical History:  Procedure Laterality Date   ABDOMINAL AORTOGRAM W/LOWER EXTREMITY Left 11/14/2020   Procedure: ABDOMINAL AORTOGRAM W/LOWER EXTREMITY;  Surgeon: Young Hensen, MD;  Location: [facility] INVASIVE CV LAB;  Service: Cardiovascular;  Laterality: Left;   ABDOMINAL AORTOGRAM W/LOWER EXTREMITY N/A 01/02/2021   Procedure: ABDOMINAL AORTOGRAM W/LOWER EXTREMITY;  Surgeon: Young Hensen, MD;  Location: [facility] INVASIVE CV LAB;  Service: Cardiovascular;  Laterality: N/A;   AMPUTATION Left  03/20/2021   Procedure: Left foot 5th ray amputation;  Surgeon: Amada Backer, MD;  [facility];  Service: Orthopedics;  Laterality: Left;   ATRIAL FIBRILLATION ABLATION N/A 01/19/2023   Procedure: ATRIAL FIBRILLATION ABLATION;  Surgeon: Lei Pump, MD;  Location: [facility] INVASIVE CV LAB;  Service: Cardiovascular;  Laterality: N/A;   BIOPSY  04/14/2023   Procedure: BIOPSY;  Surgeon: Suzette Espy, MD;  Location: [facility] ENDO SUITE;  Service: Endoscopy;;   BIV ICD INSERTION CRT-D N/A 04/30/2022   Procedure: BIV ICD INSERTION CRT-D;  Surgeon: Lei Pump, MD;  Location: [facility] INVASIVE CV LAB;  Service: Cardiovascular;  Laterality: N/A;   CARDIAC CATHETERIZATION  01/2011   ischemic cardiomyopathy 30-35%, multivessel CAD (Dr. Electa Grieve)    CARDIAC CATHETERIZATION  09/13/2015   Procedure: Right/Left Heart Cath and Coronary/Graft Angiography;  Surgeon: Hazle Lites, MD;  Location: [facility] INVASIVE CV LAB;  Service: Cardiovascular;;   CARDIOVERSION N/A 05/16/2019   Procedure: CARDIOVERSION;  Surgeon: Hazle Lites, MD;  Location: [facility] ENDOSCOPY;  Service: Cardiovascular;  Laterality: N/A;   CARDIOVERSION N/A 10/16/2022   Procedure: CARDIOVERSION;  Surgeon: Luana Rumple, MD;  Location: [facility] INVASIVE CV LAB;  Service: Cardiovascular;  Laterality: N/A;   CARDIOVERSION N/A 12/16/2022   Procedure: CARDIOVERSION;  Surgeon: Wendie Hamburg, MD;  Location: [facility] INVASIVE CV LAB;  Service: Cardiovascular;  Laterality: N/A;   COLONOSCOPY WITH PROPOFOL  N/A 04/14/2023   Procedure: COLONOSCOPY WITH PROPOFOL ;  Surgeon: Suzette Espy, MD;  [facility];  Service: Endoscopy;  Laterality: N/A;  11:00am, asa 3/4   COLONOSCOPY WITH PROPOFOL  N/A 04/18/2023   Procedure: COLONOSCOPY WITH PROPOFOL ;  Surgeon: Urban Garden, MD;  [facility]: [facility];  Service: Gastroenterology;  Laterality: N/A;   Colonscopy     CORONARY ARTERY BYPASS GRAFT  08/03/2011   Procedure: CORONARY ARTERY BYPASS  GRAFTING (CABG);  Surgeon: Bartley Lightning, MD;  Location[facility] OR;  Service: Open Heart Surgery;  Laterality: N/A;  CABG times three using right saphenous vein and left mammary artery usisng endoscope.   ESOPHAGEAL BRUSHING  04/14/2023   Procedure: ESOPHAGEAL BRUSHING;  Surgeon: Suzette Espy, MD;  [facility];  Service: Endoscopy;;   ESOPHAGOGASTRODUODENOSCOPY (EGD) WITH PROPOFOL  N/A 04/14/2023   Procedure: ESOPHAGOGASTRODUODENOSCOPY (EGD) WITH PROPOFOL ;  Surgeon: Suzette Espy, MD;  [facility];  Service: Endoscopy;  Laterality: N/A;   HEMOSTASIS CLIP PLACEMENT  04/14/2023   Procedure: HEMOSTASIS CLIP PLACEMENT;  Surgeon: Suzette Espy, MD;  [facility];  Service: Endoscopy;;  LEFT HEART CATH AND CORS/GRAFTS ANGIOGRAPHY N/A 12/15/2021   Procedure: LEFT HEART CATH AND CORS/GRAFTS ANGIOGRAPHY;  Surgeon: Arnoldo Lapping, MD;  Location: [facility] INVASIVE CV LAB;  Service: Cardiovascular;  Laterality: N/A;   LEFT HEART CATHETERIZATION WITH CORONARY/GRAFT ANGIOGRAM N/A 09/20/2013   Procedure: LEFT HEART CATHETERIZATION WITH Estella Helling;  Surgeon: Hazle Lites, MD;  Location: [facility] CATH LAB;  Service: Cardiovascular;  Laterality: N/A;   Lower Extremity Arterial Doppler  03/16/2013   bilat ABIs demonstated normal values; R runoff - posterior tibial & anterior tibial arteries occluded; L runoff - peroneal & posterior tibial arteries occluded, anterior tibial artery appears occluded   LUMBAR LAMINECTOMY/DECOMPRESSION MICRODISCECTOMY N/A 09/12/2014   Procedure: LUMBAR DECOMPRESSION L3-L4, L4-L5, MICRODISCECTOMY L4-L5;  Surgeon: Orvan Blanch, MD;  Location: [facility] ORS;  Service: Orthopedics;  Laterality: N/A;   PERIPHERAL VASCULAR BALLOON ANGIOPLASTY  11/14/2020   Procedure: PERIPHERAL VASCULAR BALLOON ANGIOPLASTY;  Surgeon: Young Hensen, MD;  Location: [facility] INVASIVE CV LAB;  Service: Cardiovascular;;   PERIPHERAL VASCULAR BALLOON ANGIOPLASTY Left 01/02/2021    Procedure: PERIPHERAL VASCULAR BALLOON ANGIOPLASTY;  Surgeon: Young Hensen, MD;  Location: [facility] INVASIVE CV LAB;  Service: Cardiovascular;  Laterality: Left;  Failed PTA   Pilonidal Cyst removed     POLYPECTOMY  04/14/2023   Procedure: POLYPECTOMY INTESTINAL;  Surgeon: Suzette Espy, MD;  [facility];  Service: Endoscopy;;   SCLEROTHERAPY  04/14/2023   Procedure: Daryle Eon;  Surgeon: Suzette Espy, MD;  [facility];  Service: Endoscopy;;   SUBMUCOSAL LIFTING INJECTION  04/14/2023   Procedure: SUBMUCOSAL LIFTING INJECTION;  Surgeon: Suzette Espy, MD;  [facility];  Service: Endoscopy;;   SUBMUCOSAL TATTOO INJECTION  04/14/2023   Procedure: SUBMUCOSAL TATTOO INJECTION;  Surgeon: Suzette Espy, MD;  [facility];  Service: Endoscopy;;   TRANSTHORACIC ECHOCARDIOGRAM  11/10/2012   EF 40-45%, mild LVH, mild hypokinesis of anteroseptal myocardium, grade 1 diastolic dysfunction; mild MR & calcifed mitral annulus; LA mild-mod dilated; RA mildly dilated    Past Family History   Family History  Problem Relation Age of Onset   Cancer Mother    Lung disease Father        & heart disease   Colon polyps Father    Colon cancer Sister        74s or 53s   Sudden death Maternal Grandmother    Anesthesia problems Daughter     Past Social History   Social History   Socioeconomic History   Marital status: Widowed    Spouse name: Not on file   Number of children: 1   Years of education: Not on file   Highest education level: Not on file  Occupational History   Occupation: real Psychologist, occupational  Tobacco Use   Smoking status: Former    Types: Cigars    Quit date: 09/07/2011    Years since quitting: 12.1   Smokeless tobacco: Former    Quit date: 02/28/2012   Tobacco comments:    Former smoker 02/16/23  Vaping Use   Vaping status: Never Used  Substance and Sexual Activity   Alcohol  use: Not Currently    Alcohol /week: 1.0 - 2.0  standard drink of alcohol     Types: 1 - 2 Cans of beer per week   Drug use: No   Sexual activity: Not on file  Other Topics Concern   Not on file  Social History Narrative   ** Merged History Encounter **  Social Drivers of Corporate investment banker Strain: Not on file  Food Insecurity: No Food Insecurity (04/17/2023)   Hunger Vital Sign    Worried About Running Out of Food in the Last Year: Never true    Ran Out of Food in the Last Year: Never true  Transportation Needs: No Transportation Needs (04/17/2023)   PRAPARE - Administrator, Civil Service (Medical): No    Lack of Transportation (Non-Medical): No  Physical Activity: Unknown (05/05/2023)   Received from [facility]   PCARE Exercise SDOH    Exercise: Active Lifestyle Only;Physical Therapy    PCare Exercise SDOH: Not on file    PCare Exercise SDOH: Not on file  Stress: Not on file  Social Connections: Not on file  Intimate Partner Violence: Not At Risk (04/17/2023)   Humiliation, Afraid, Rape, and Kick questionnaire    Fear of Current or Ex-Partner: No    Emotionally Abused: No    Physically Abused: No    Sexually Abused: No    Review of Systems   General: Negative for  weight loss, fever, chills, +fatigue, +weakness.+poor appetite ENT: Negative for hoarseness, difficulty swallowing , nasal congestion. CV: Negative for chest pain, angina, palpitations, dyspnea on exertion, +peripheral edema.  Respiratory: Negative for dyspnea at rest, dyspnea on exertion, cough, sputum, wheezing.  GI: See history of present illness. GU:  Negative for dysuria, hematuria, urinary incontinence, urinary frequency, nocturnal urination.  Endo: Negative for unusual weight change.     Physical Exam   BP 98/60   Pulse (!) 59   Temp 98.5 F (36.9 C)   Ht 6\' 2"  (1.88 m)   Wt 203 lb 12.8 oz (92.4 kg)   BMI 26.17 kg/m    General: elderly chronically ill appearing male in NAD.  Eyes: No  icterus. Mouth: Oropharyngeal mucosa moist and pink . Lungs: Clear to auscultation bilaterally.  Heart: Regular rate and rhythm, no murmurs rubs or gallops.  Abdomen: Bowel sounds are normal, nontender, nondistended, no hepatosplenomegaly or masses,  no abdominal bruits or hernia , no rebound or guarding.  Rectal: not performed  Extremities: 1+ lower extremity edema. No clubbing or deformities.old eschars on both knees with mild peripheral redness no signs of infection Neuro: Alert and oriented x 4   Skin: Warm and dry, no jaundice.   Psych: Alert and cooperative, normal mood and affect.  Labs   See hpi  Lab Results  Component Value Date   IRON 33 (L) 05/05/2023   TIBC 418 05/05/2023   FERRITIN 66 05/05/2023   Lab Results  Component Value Date   VITAMINB12 259 05/05/2023   Lab Results  Component Value Date   FOLATE 14.9 12/08/2021     Imaging Studies   DG Chest 2 View Result Date: 10/08/2023 CLINICAL DATA:  78 year old male with a history of pleural effusion EXAM: CHEST - 2 VIEW COMPARISON:  04/21/2023, 09/21/2023 FINDINGS: Cardiomediastinal silhouette unchanged in size and contour. Left chest wall pacing device/AICD. Persisting opacity at the left lung base, though smaller than the comparison. Obscuration left hemidiaphragm, left heart border, with blunting of the costophrenic angle. Meniscus on the lateral view and frontal view. Right lung relatively well aerated. No pneumothorax. IMPRESSION: Small left-sided pleural effusion with associated atelectasis/consolidation. Negative for overt edema. Electronically Signed   By: Myrlene Asper D.O.   On: 10/08/2023 14:13    Assessment/Plan:   Cancer of the sigmoid colon/anemia:   Large rectosigmoid lesion underwent piecemeal removal and  clipping.  Unfortunately developed post polypectomy bleed going back on Eliquis .  This required treatment with additional clips.  Pathology report from this lesion demonstrated a small fragment of  moderately differentiated invasive adenocarcinoma.  Surgical consultation with Dr. Joyce Nixon.  Discussion between Dr. Riley Cheadle and Dr. Andy Bannister, due to significant comorbidities, would likely be too risky to attempt a sigmoid resection. It is possible that the entire lesion was resected. General consensus would be to go back and complete a sigmoidoscopy to reassess the polypectomy site and if any residual there go ahead and remove.  Plan for flexible sigmoidoscopy in the near future.  Cardiology has cleared him for the procedure stating that he is currently euvolemic and has been optimized from a cardiac standpoint.  See cardiology note 10/25/23. We will hold his Eliquis  for 48 hours prior to procedure.  Patient and family understand that bleeding from biopsy/polypectomy is a known risk of the procedure with periprocedure anticoagulation as an additive risk factor, but Dr. Riley Cheadle feels that it can safely be done.  As previously planned, colon prep to be completed. Holding eliquis  48 hours before procedure. Holding farxiga 72 hours before.   I have discussed the risks, alternatives, benefits with regards to but not limited to the risk of reaction to medication, bleeding, infection, perforation and the patient is agreeable to proceed. Written consent to be obtained.   Trudie Fuse. Harles Lied, MHS, PA-C [facility]

## 2023-11-02 NOTE — Patient Instructions (Signed)
 Sigmoidoscopy to be scheduled in near future.

## 2023-11-02 NOTE — H&P (View-Only) (Signed)
 GI Office Note    Referring Provider: Orest Bio, MD Primary Care Physician:  Orest Bio, MD  Primary Gastroenterologist: Rheba Cedar, MD   Chief Complaint   Chief Complaint  Patient presents with   Follow-up    Pt was suppose to have a colonoscopy 6 weeks ago but it got canceled and he was told to be here today    History of Present Illness   [name] [name] is a 78 y.o. male presenting today for follow up. Last seen in 08/2023. Here to reassess for candidacy for follow up sigmoidoscopy to further evaluate site of resctosigmoid polyp removed back in 03/2023. He was scheduled in 08/2023 but he was cancelled due to CHF at that time. He has since been back to see cardiology. He was last seen by then 10/25/23 and felt to be stable for sigmoidoscopy.   From last OV with Dr. 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  Saw Dr. Joyce Nixon.  Thoughtful consultation ensued.  Patient's comorbidities makes him significant and nearly prohibitive operative risk for a segmental resection.  I discussed with Dr. Andy Bannister.  We both felt all things considered to be very reasonable in this setting to just follow-up the polypectomy site and see if there is any residual polyp tissue remaining.  Unfortunately, patient developed a 1 unit post polypectomy bleed requiring additional clips by Dr. Sammi Crick few days after his index colonoscopy.  I spoke to his daughter Rolm Clos today as well the patient at length.  Unfortunately, the post polypectomy bleed after resuming Eliquis  in spite of clipping, set him back,  he has remained somewhat weak since that time;  a little bit unsteady his feet.  Uses a cane to ambulate.  Initial CEA 1.8.  Last hemoglobin 10.8 from back in November has not had any further hematochezia or melena; no upper GI tract symptoms.  He was found to have Candida esophagitis and non-H. pylori  gastritis last fall as well.  He was treated with Diflucan .   He continues to be anticoagulated with Eliquis .  Today: Presents with his significant other, [name].  From a GI standpoint, bowel movements have been regular.  No blood in the stool or melena.  No abdominal pain.  No heartburn or dysphagia.  No odynophagia.  Appetite remains poor.  They are concerned about some weight loss.  They believe it may be due to his Farxiga and have discussed with cardiology about potentially reducing the dose.  Decision was made to hold off until after colonoscopy.  His weight is down about 20 pounds since April 2024 but overall stable since September of last year.  He notes his fluid status has improved.  He continues to be fairly weak.  Ambulates with a cane.  Has had some falls recently.  Not back to his baseline since prior to his initial colonoscopy.   Labs April 2025: Glucose 114, BUN 20, creatinine 1.25, sodium 141, potassium 4.02 September 2023: Hemoglobin 10.9 stable from October 2024.  MCV 77, platelets 203, CEA 1.4 down from 1.86 months ago.  CT abdomen pelvis with contrast April 20, 2023: IMPRESSION: -No bowel obstruction, free air. Metallic clips seen along the course of the colon with one at the level of the ileocecal valve in the other at the distal sigmoid colon. Please correlate with recent colonoscopy. -Scattered mesenteric haziness. There is also trace ascites and presacral fat stranding. Anasarca. -No  developing liver mass, lymph node enlargement. No mesenteric lesion. -Gallstones. -New small bilateral pleural effusions with some adjacent opacity. Recommend follow-up  EGD April 14, 2023: -Probable Candida esophagitis status post KOH brushings, KOH positive treated with Diflucan  -Abnormal gastric mucosa of uncertain significance status post biopsy, mild nonspecific reactive gastropathy, no H. pylori  Colonoscopy April 14, 2023: -Diverticulosis in the sigmoid colon and  descending colon -three 4 to 6 mm polyps at the rectosigmoid colon, ascending colon, cecum, tubular adenomas -one 20 x 10 mm polyp at the rectosigmoid colon, removed with hot snare.  Clipped and demarcated with ink.  Pathology showed single fragment of invasive moderately differentiated adenocarcinoma, other fragments of hyperplastic polyp tubular adenoma without high-grade dysplasia -Restart Eliquis  tomorrow  Colonoscopy April 18, 2023: Post polypectomy bleed - Preparation of colon was fair -two to 3 mm polyps in the transverse colon and ascending colon not removed - Single solitary post polypectomy ulcer in the cecum.  Clips were placed. - Single solitary post polypectomy ulcer in the ascending colon. - 2 clips in the rectosigmoid colon at site of previous polypectomy.  2 more clips placed at polypectomy site to prevent recurrent bleeding - Distal rectum and anal verge normal -Restart Eliquis  in 7 days  Medications   Current Outpatient Medications  Medication Sig Dispense Refill   acetaminophen  (TYLENOL ) 500 MG tablet Take 500-1,000 mg by mouth every 6 (six) hours as needed (for pain.).     apixaban  (ELIQUIS ) 5 MG TABS tablet Take 1 tablet (5 mg total) by mouth 2 (two) times daily.     carvedilol  (COREG ) 3.125 MG tablet Take 1 tablet by mouth twice daily 180 tablet 3   gabapentin  (NEURONTIN ) 300 MG capsule Take 600 mg by mouth at bedtime.     JARDIANCE  25 MG TABS tablet Take 1 tablet by mouth once daily 90 tablet 0   metFORMIN  (GLUCOPHAGE -XR) 500 MG 24 hr tablet Take 1,000 mg by mouth 2 (two) times daily.     ONETOUCH VERIO test strip 1 each daily.     rosuvastatin  (CRESTOR ) 10 MG tablet Take 1 tablet (10 mg total) by mouth every other day. 45 tablet 3   torsemide  (DEMADEX ) 20 MG tablet Take 1 tablet (20 mg total) by mouth daily. 90 tablet 3   No current facility-administered medications for this visit.    Allergies   Allergies as of 11/02/2023 - Review Complete 11/02/2023   Allergen Reaction Noted   Entresto  [sacubitril -valsartan ] Other (See Comments) 02/25/2017   Sacubitril  Other (See Comments) 10/05/2017     Past Medical History   Past Medical History:  Diagnosis Date   AICD (automatic cardioverter/defibrillator) present    Arthritis    Knee L - probably   Atrial fibrillation (HCC)    BPH (benign prostatic hyperplasia)    CAD (coronary artery disease) 07/06/2011   CHF (congestive heart failure) (HCC)    Coronary artery disease    Diabetes mellitus    Frequency of urination    Heart failure (HCC)    High cholesterol    History of nuclear stress test 09/2010   dipyridamole; mild perfusion defect due to attenuation with mild superimposed ischemia in apical septal, apical, apical inferior & apical lateral regions; rest LV enlarged in size; prominent gut uptake in infero-apical region; no significant ischemia demonstrated; low risk scan    HNP (herniated nucleus pulposus), lumbar    Hypertension    Ischemic cardiomyopathy    LV dysfunction 07/06/2011   Nocturia    Right  bundle branch block    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 Cherylynn Cosier   Sleep apnea    sleep study 10/2010- AHI during total sleep 32.1/hr and during REM 62.3/hr (severe sleep apnea)unable to tolerate c pap   Spinal stenosis of lumbar region    Stroke [facility])    [place]     Past Surgical History   Past Surgical History:  Procedure Laterality Date   ABDOMINAL AORTOGRAM W/LOWER EXTREMITY Left 11/14/2020   Procedure: ABDOMINAL AORTOGRAM W/LOWER EXTREMITY;  Surgeon: Young Hensen, MD;  Location: [facility] INVASIVE CV LAB;  Service: Cardiovascular;  Laterality: Left;   ABDOMINAL AORTOGRAM W/LOWER EXTREMITY N/A 01/02/2021   Procedure: ABDOMINAL AORTOGRAM W/LOWER EXTREMITY;  Surgeon: Young Hensen, MD;  Location: [facility] INVASIVE CV LAB;  Service: Cardiovascular;  Laterality: N/A;   AMPUTATION Left  03/20/2021   Procedure: Left foot 5th ray amputation;  Surgeon: Amada Backer, MD;  [facility];  Service: Orthopedics;  Laterality: Left;   ATRIAL FIBRILLATION ABLATION N/A 01/19/2023   Procedure: ATRIAL FIBRILLATION ABLATION;  Surgeon: Lei Pump, MD;  Location: [facility] INVASIVE CV LAB;  Service: Cardiovascular;  Laterality: N/A;   BIOPSY  04/14/2023   Procedure: BIOPSY;  Surgeon: Suzette Espy, MD;  Location: [facility] ENDO SUITE;  Service: Endoscopy;;   BIV ICD INSERTION CRT-D N/A 04/30/2022   Procedure: BIV ICD INSERTION CRT-D;  Surgeon: Lei Pump, MD;  Location: [facility] INVASIVE CV LAB;  Service: Cardiovascular;  Laterality: N/A;   CARDIAC CATHETERIZATION  01/2011   ischemic cardiomyopathy 30-35%, multivessel CAD (Dr. Electa Grieve)    CARDIAC CATHETERIZATION  09/13/2015   Procedure: Right/Left Heart Cath and Coronary/Graft Angiography;  Surgeon: Hazle Lites, MD;  Location: [facility] INVASIVE CV LAB;  Service: Cardiovascular;;   CARDIOVERSION N/A 05/16/2019   Procedure: CARDIOVERSION;  Surgeon: Hazle Lites, MD;  Location: [facility] ENDOSCOPY;  Service: Cardiovascular;  Laterality: N/A;   CARDIOVERSION N/A 10/16/2022   Procedure: CARDIOVERSION;  Surgeon: Luana Rumple, MD;  Location: [facility] INVASIVE CV LAB;  Service: Cardiovascular;  Laterality: N/A;   CARDIOVERSION N/A 12/16/2022   Procedure: CARDIOVERSION;  Surgeon: Wendie Hamburg, MD;  Location: [facility] INVASIVE CV LAB;  Service: Cardiovascular;  Laterality: N/A;   COLONOSCOPY WITH PROPOFOL  N/A 04/14/2023   Procedure: COLONOSCOPY WITH PROPOFOL ;  Surgeon: Suzette Espy, MD;  Location: [facility];  Service: Endoscopy;  Laterality: N/A;  11:00am, asa 3/4   COLONOSCOPY WITH PROPOFOL  N/A 04/18/2023   Procedure: COLONOSCOPY WITH PROPOFOL ;  Surgeon: Urban Garden, MD;  Location: [facility];  Service: Gastroenterology;  Laterality: N/A;   Colonscopy     CORONARY ARTERY BYPASS GRAFT  08/03/2011   Procedure: CORONARY ARTERY BYPASS  GRAFTING (CABG);  Surgeon: Bartley Lightning, MD;  Location[facility] OR;  Service: Open Heart Surgery;  Laterality: N/A;  CABG times three using right saphenous vein and left mammary artery usisng endoscope.   ESOPHAGEAL BRUSHING  04/14/2023   Procedure: ESOPHAGEAL BRUSHING;  Surgeon: Suzette Espy, MD;  Location: [facility];  Service: Endoscopy;;   ESOPHAGOGASTRODUODENOSCOPY (EGD) WITH PROPOFOL  N/A 04/14/2023   Procedure: ESOPHAGOGASTRODUODENOSCOPY (EGD) WITH PROPOFOL ;  Surgeon: Suzette Espy, MD;  Location: [facility];  Service: Endoscopy;  Laterality: N/A;   HEMOSTASIS CLIP PLACEMENT  04/14/2023   Procedure: HEMOSTASIS CLIP PLACEMENT;  Surgeon: Suzette Espy, MD;  [facility];  Service: Endoscopy;;  LEFT HEART CATH AND CORS/GRAFTS ANGIOGRAPHY N/A 12/15/2021   Procedure: LEFT HEART CATH AND CORS/GRAFTS ANGIOGRAPHY;  Surgeon: Arnoldo Lapping, MD;  Location: [facility] INVASIVE CV LAB;  Service: Cardiovascular;  Laterality: N/A;   LEFT HEART CATHETERIZATION WITH CORONARY/GRAFT ANGIOGRAM N/A 09/20/2013   Procedure: LEFT HEART CATHETERIZATION WITH Estella Helling;  Surgeon: Hazle Lites, MD;  Location: [facility] CATH LAB;  Service: Cardiovascular;  Laterality: N/A;   Lower Extremity Arterial Doppler  03/16/2013   bilat ABIs demonstated normal values; R runoff - posterior tibial & anterior tibial arteries occluded; L runoff - peroneal & posterior tibial arteries occluded, anterior tibial artery appears occluded   LUMBAR LAMINECTOMY/DECOMPRESSION MICRODISCECTOMY N/A 09/12/2014   Procedure: LUMBAR DECOMPRESSION L3-L4, L4-L5, MICRODISCECTOMY L4-L5;  Surgeon: Orvan Blanch, MD;  Location: [facility] ORS;  Service: Orthopedics;  Laterality: N/A;   PERIPHERAL VASCULAR BALLOON ANGIOPLASTY  11/14/2020   Procedure: PERIPHERAL VASCULAR BALLOON ANGIOPLASTY;  Surgeon: Young Hensen, MD;  Location: [facility] INVASIVE CV LAB;  Service: Cardiovascular;;   PERIPHERAL VASCULAR BALLOON ANGIOPLASTY Left 01/02/2021    Procedure: PERIPHERAL VASCULAR BALLOON ANGIOPLASTY;  Surgeon: Young Hensen, MD;  Location: [facility] INVASIVE CV LAB;  Service: Cardiovascular;  Laterality: Left;  Failed PTA   Pilonidal Cyst removed     POLYPECTOMY  04/14/2023   Procedure: POLYPECTOMY INTESTINAL;  Surgeon: Suzette Espy, MD;  Location: [facility];  Service: Endoscopy;;   SCLEROTHERAPY  04/14/2023   Procedure: Daryle Eon;  Surgeon: Suzette Espy, MD;  Location: [facility];  Service: Endoscopy;;   SUBMUCOSAL LIFTING INJECTION  04/14/2023   Procedure: SUBMUCOSAL LIFTING INJECTION;  Surgeon: Suzette Espy, MD;  [facility];  Service: Endoscopy;;   SUBMUCOSAL TATTOO INJECTION  04/14/2023   Procedure: SUBMUCOSAL TATTOO INJECTION;  Surgeon: Suzette Espy, MD;  Location: [facility];  Service: Endoscopy;;   TRANSTHORACIC ECHOCARDIOGRAM  11/10/2012   EF 40-45%, mild LVH, mild hypokinesis of anteroseptal myocardium, grade 1 diastolic dysfunction; mild MR & calcifed mitral annulus; LA mild-mod dilated; RA mildly dilated    Past Family History   Family History  Problem Relation Age of Onset   Cancer Mother    Lung disease Father        & heart disease   Colon polyps Father    Colon cancer Sister        74s or 53s   Sudden death Maternal Grandmother    Anesthesia problems Daughter     Past Social History   Social History   Socioeconomic History   Marital status: Widowed    Spouse name: Not on file   Number of children: 1   Years of education: Not on file   Highest education level: Not on file  Occupational History   Occupation: real Psychologist, occupational  Tobacco Use   Smoking status: Former    Types: Cigars    Quit date: 09/07/2011    Years since quitting: 12.1   Smokeless tobacco: Former    Quit date: 02/28/2012   Tobacco comments:    Former smoker 02/16/23  Vaping Use   Vaping status: Never Used  Substance and Sexual Activity   Alcohol  use: Not Currently    Alcohol /week: 1.0 - 2.0  standard drink of alcohol     Types: 1 - 2 Cans of beer per week   Drug use: No   Sexual activity: Not on file  Other Topics Concern   Not on file  Social History Narrative   ** Merged History Encounter **  Social Drivers of Corporate investment banker Strain: Not on file  Food Insecurity: No Food Insecurity (04/17/2023)   Hunger Vital Sign    Worried About Running Out of Food in the Last Year: Never true    Ran Out of Food in the Last Year: Never true  Transportation Needs: No Transportation Needs (04/17/2023)   PRAPARE - Administrator, Civil Service (Medical): No    Lack of Transportation (Non-Medical): No  Physical Activity: Unknown (05/05/2023)   Received from [facility]   PCARE Exercise SDOH    Exercise: Active Lifestyle Only;Physical Therapy    PCare Exercise SDOH: Not on file    PCare Exercise SDOH: Not on file  Stress: Not on file  Social Connections: Not on file  Intimate Partner Violence: Not At Risk (04/17/2023)   Humiliation, Afraid, Rape, and Kick questionnaire    Fear of Current or Ex-Partner: No    Emotionally Abused: No    Physically Abused: No    Sexually Abused: No    Review of Systems   General: Negative for  weight loss, fever, chills, +fatigue, +weakness.+poor appetite ENT: Negative for hoarseness, difficulty swallowing , nasal congestion. CV: Negative for chest pain, angina, palpitations, dyspnea on exertion, +peripheral edema.  Respiratory: Negative for dyspnea at rest, dyspnea on exertion, cough, sputum, wheezing.  GI: See history of present illness. GU:  Negative for dysuria, hematuria, urinary incontinence, urinary frequency, nocturnal urination.  Endo: Negative for unusual weight change.     Physical Exam   BP 98/60   Pulse (!) 59   Temp 98.5 F (36.9 C)   Ht 6\' 2"  (1.88 m)   Wt 203 lb 12.8 oz (92.4 kg)   BMI 26.17 kg/m    General: elderly chronically ill appearing male in NAD.  Eyes: No  icterus. Mouth: Oropharyngeal mucosa moist and pink . Lungs: Clear to auscultation bilaterally.  Heart: Regular rate and rhythm, no murmurs rubs or gallops.  Abdomen: Bowel sounds are normal, nontender, nondistended, no hepatosplenomegaly or masses,  no abdominal bruits or hernia , no rebound or guarding.  Rectal: not performed  Extremities: 1+ lower extremity edema. No clubbing or deformities.old eschars on both knees with mild peripheral redness no signs of infection Neuro: Alert and oriented x 4   Skin: Warm and dry, no jaundice.   Psych: Alert and cooperative, normal mood and affect.  Labs   See hpi  Lab Results  Component Value Date   IRON 33 (L) 05/05/2023   TIBC 418 05/05/2023   FERRITIN 66 05/05/2023   Lab Results  Component Value Date   VITAMINB12 259 05/05/2023   Lab Results  Component Value Date   FOLATE 14.9 12/08/2021     Imaging Studies   DG Chest 2 View Result Date: 10/08/2023 CLINICAL DATA:  78 year old male with a history of pleural effusion EXAM: CHEST - 2 VIEW COMPARISON:  04/21/2023, 09/21/2023 FINDINGS: Cardiomediastinal silhouette unchanged in size and contour. Left chest wall pacing device/AICD. Persisting opacity at the left lung base, though smaller than the comparison. Obscuration left hemidiaphragm, left heart border, with blunting of the costophrenic angle. Meniscus on the lateral view and frontal view. Right lung relatively well aerated. No pneumothorax. IMPRESSION: Small left-sided pleural effusion with associated atelectasis/consolidation. Negative for overt edema. Electronically Signed   By: Myrlene Asper D.O.   On: 10/08/2023 14:13    Assessment/Plan:   Cancer of the sigmoid colon/anemia:   Large rectosigmoid lesion underwent piecemeal removal and  clipping.  Unfortunately developed post polypectomy bleed going back on Eliquis .  This required treatment with additional clips.  Pathology report from this lesion demonstrated a small fragment of  moderately differentiated invasive adenocarcinoma.  Surgical consultation with Dr. Joyce Nixon.  Discussion between Dr. Riley Cheadle and Dr. Andy Bannister, due to significant comorbidities, would likely be too risky to attempt a sigmoid resection. It is possible that the entire lesion was resected. General consensus would be to go back and complete a sigmoidoscopy to reassess the polypectomy site and if any residual there go ahead and remove.  Plan for flexible sigmoidoscopy in the near future.  Cardiology has cleared him for the procedure stating that he is currently euvolemic and has been optimized from a cardiac standpoint.  See cardiology note 10/25/23. We will hold his Eliquis  for 48 hours prior to procedure.  Patient and family understand that bleeding from biopsy/polypectomy is a known risk of the procedure with periprocedure anticoagulation as an additive risk factor, but Dr. Riley Cheadle feels that it can safely be done.  As previously planned, colon prep to be completed. Holding eliquis  48 hours before procedure. Holding farxiga 72 hours before.   I have discussed the risks, alternatives, benefits with regards to but not limited to the risk of reaction to medication, bleeding, infection, perforation and the patient is agreeable to proceed. Written consent to be obtained.   Trudie Fuse. Harles Lied, MHS, PA-C [facility]

## 2023-11-08 ENCOUNTER — Encounter: Attending: Cardiology

## 2023-11-08 DIAGNOSIS — Z95810 Presence of automatic (implantable) cardiac defibrillator: Secondary | ICD-10-CM

## 2023-11-08 DIAGNOSIS — I5022 Chronic systolic (congestive) heart failure: Secondary | ICD-10-CM | POA: Diagnosis not present

## 2023-11-08 NOTE — Patient Instructions (Addendum)
 [name] [name]  11/08/2023       Your procedure is scheduled on FRIDAY 11/12/23.   Report to [facility] at 12:45 pm   Call this number if you have problems the morning of surgery:  [phone]  If you experience any cold or flu symptoms such as cough, fever, chills, shortness of breath, etc. between now and your scheduled surgery, please notify us  at the above number.   Remember:   FOLLOW DIET AND PREP INSTRUCTIONS GIVEN TO YOU BY YOUR DOCTOR   You may drink clear liquids until 10:45 AM .  Clear liquids allowed are:  Water , Juice (No red color; non-citric and without pulp; diabetics please choose diet or no sugar options), Carbonated beverages (diabetics please choose diet or no sugar options), Clear Tea (No creamer, milk, or cream, including half & half and powdered creamer), Black Coffee Only (No creamer, milk or cream, including half & half and powdered creamer), Plain Jell-O Only (No red color; diabetics please choose no sugar options), Clear Sports drink (No red color; diabetics please choose diet or no sugar options), and Plain Popsicles Only (No red color; diabetics please choose no sugar options)    Take these medicines the morning of surgery with A SIP OF WATER    carvedilol  (COREG ),acetaminophen  (TYLENOL ) IF NEEDED    HOLD JARDIANCE  3 DAYS PRIOR TO PROCEDURE LAST DOSE ON 11/08/23  HOLD ELIQUIS  2 DAYS PRIOR TO PROCEDURE LAST DOSE ON 11/09/23  DO NOT TAKE ANY DIABETIC MEDICATIONS DAY OF PROCEDURE  Do not wear jewelry, make-up or nail polish, including gel polish,  artificial nails, or any other type of covering on natural nails (fingers and  toes).  Do not wear lotions, powders, or perfumes, or deodorant.  Do not shave 48 hours prior to surgery.  Men may shave face and neck.  Do not bring valuables to the hospital.  [facility] is not responsible for any belongings or valuables.  Contacts, dentures or bridgework may not be worn into surgery.  Leave your  suitcase in the car.  After surgery it may be brought to your room.  For patients admitted to the hospital, discharge time will be determined by your treatment team.  Patients discharged the day of surgery will not be allowed to drive home and must have someone be with you for 24 hours after procedure.    Special instructions:  DO NOT SMOKE TOBACCO OR VAPE 24 HOURS PRIOR TO YOUR PROCEDURE.    Please read over the following fact sheets that you were given. Anesthesia Post-op Instructions and Care and Recovery After Surgery  Flexible Sigmoidoscopy, Care After The following information offers guidance on how to care for yourself after your procedure. Your health care provider may also give you more specific instructions. If you have problems or questions, contact your health care provider. What can I expect after the procedure? After the procedure, it is common to have: Cramping or pain in your abdomen. Bloating. A small amount of blood with your stool. This may happen if a sample of tissue was removed for testing (biopsy). Follow these instructions at home: Eating and drinking Drink enough fluid to keep your urine pale yellow. Follow instructions from your health care provider about what you may eat and drink. Go back to your normal diet as told by your health care provider. Avoid heavy or fried foods. These may be hard to digest. Activity  If you were given a sedative during the procedure, it can  affect you for several hours. Do not drive or operate machinery until your health care provider says that it is safe. Rest as told by your health care provider. Do not sit for a long time without moving. Get up to take short walks every 1-2 hours. This will improve blood flow and breathing. Ask for help if you feel weak or unsteady. Return to your normal activities as told by your health care provider. Ask your health care provider what activities are safe for you. General instructions Take  over-the-counter and prescription medicines only as told by your health care provider. Try walking around when you have cramps or feel bloated. Contact a health care provider if: You have pain or cramping in your abdomen that gets worse or is not helped with medicine. You have a small amount of bleeding from your rectum for more than 24 hours after your procedure. You have nausea or vomiting. You feel weak or dizzy. You have a fever. Get help right away if: You pass large blood clots or see a large amount of blood in the toilet after having a bowel movement. You have severe pain in your abdomen. This information is not intended to replace advice given to you by your health care provider. Make sure you discuss any questions you have with your health care provider. Document Revised: 11/19/2021 Document Reviewed: 11/19/2021 Elsevier Patient Education  2024 [employer]   Monitored Anesthesia Care, Care After The following information offers guidance on how to care for yourself after your procedure. Your health care provider may also give you more specific instructions. If you have problems or questions, contact your health care provider. What can I expect after the procedure? After the procedure, it is common to have: Tiredness. Little or no memory about what happened during or after the procedure. Impaired judgment when it comes to making decisions. Nausea or vomiting. Some trouble with balance. Follow these instructions at home: For the time period you were told by your health care provider:  Rest. Do not participate in activities where you could fall or become injured. Do not drive or use machinery. Do not drink alcohol . Do not take sleeping pills or medicines that cause drowsiness. Do not make important decisions or sign legal documents. Do not take care of children on your own. Medicines Take over-the-counter and prescription medicines only as told by your health care  provider. If you were prescribed antibiotics, take them as told by your health care provider. Do not stop using the antibiotic even if you start to feel better. Eating and drinking Follow instructions from your health care provider about what you may eat and drink. Drink enough fluid to keep your urine pale yellow. If you vomit: Drink clear fluids slowly and in small amounts as you are able. Clear fluids include water , ice chips, low-calorie sports drinks, and fruit juice that has water  added to it (diluted fruit juice). Eat light and bland foods in small amounts as you are able. These foods include bananas, applesauce, rice, lean meats, toast, and crackers. General instructions  Have a responsible adult stay with you for the time you are told. It is important to have someone help care for you until you are awake and alert. If you have sleep apnea, surgery and some medicines can increase your risk for breathing problems. Follow instructions from your health care provider about wearing your sleep device: When you are sleeping. This includes during daytime naps. While taking prescription pain medicines, sleeping medicines, or  medicines that make you drowsy. Do not use any products that contain nicotine or tobacco. These products include cigarettes, chewing tobacco, and vaping devices, such as e-cigarettes. If you need help quitting, ask your health care provider. Contact a health care provider if: You feel nauseous or vomit every time you eat or drink. You feel light-headed. You are still sleepy or having trouble with balance after 24 hours. You get a rash. You have a fever. You have redness or swelling around the IV site. Get help right away if: You have trouble breathing. You have new confusion after you get home. These symptoms may be an emergency. Get help right away. Call 911. Do not wait to see if the symptoms will go away. Do not drive yourself to the hospital. This information is  not intended to replace advice given to you by your health care provider. Make sure you discuss any questions you have with your health care provider. Document Revised: 11/10/2021 Document Reviewed: 11/10/2021 Elsevier Patient Education  2024 [employer].

## 2023-11-09 ENCOUNTER — Encounter (HOSPITAL_COMMUNITY)
Admission: RE | Admit: 2023-11-09 | Discharge: 2023-11-09 | Disposition: A | Discharge status: Home / Self Care | Source: Ambulatory Visit | Attending: Internal Medicine | Admitting: Internal Medicine

## 2023-11-09 DIAGNOSIS — I10 Essential (primary) hypertension: Secondary | ICD-10-CM

## 2023-11-09 DIAGNOSIS — E119 Type 2 diabetes mellitus without complications: Secondary | ICD-10-CM

## 2023-11-10 ENCOUNTER — Encounter (HOSPITAL_COMMUNITY): Payer: Self-pay

## 2023-11-10 ENCOUNTER — Inpatient Hospital Stay (HOSPITAL_COMMUNITY)
Admission: RE | Admit: 2023-11-10 | Discharge: 2023-11-10 | Disposition: A | Discharge status: Home / Self Care | Source: Ambulatory Visit

## 2023-11-10 DIAGNOSIS — R5383 Other fatigue: Secondary | ICD-10-CM | POA: Diagnosis not present

## 2023-11-10 DIAGNOSIS — R531 Weakness: Secondary | ICD-10-CM | POA: Diagnosis not present

## 2023-11-12 ENCOUNTER — Encounter (HOSPITAL_COMMUNITY): Payer: Self-pay | Admitting: Internal Medicine

## 2023-11-12 ENCOUNTER — Encounter (HOSPITAL_COMMUNITY)
Admission: RE | Disposition: A | Discharge status: Home / Self Care | Payer: Self-pay | Source: Home / Self Care | Attending: Internal Medicine

## 2023-11-12 ENCOUNTER — Ambulatory Visit (HOSPITAL_COMMUNITY): Admitting: Anesthesiology

## 2023-11-12 ENCOUNTER — Ambulatory Visit (HOSPITAL_COMMUNITY)
Admission: RE | Admit: 2023-11-12 | Discharge: 2023-11-12 | Disposition: A | Discharge status: Home / Self Care | Attending: Internal Medicine | Admitting: Internal Medicine

## 2023-11-12 DIAGNOSIS — Z7901 Long term (current) use of anticoagulants: Secondary | ICD-10-CM | POA: Diagnosis not present

## 2023-11-12 DIAGNOSIS — G4730 Sleep apnea, unspecified: Secondary | ICD-10-CM | POA: Diagnosis not present

## 2023-11-12 DIAGNOSIS — I110 Hypertensive heart disease with heart failure: Secondary | ICD-10-CM | POA: Insufficient documentation

## 2023-11-12 DIAGNOSIS — I2510 Atherosclerotic heart disease of native coronary artery without angina pectoris: Secondary | ICD-10-CM | POA: Diagnosis not present

## 2023-11-12 DIAGNOSIS — K6389 Other specified diseases of intestine: Secondary | ICD-10-CM | POA: Diagnosis not present

## 2023-11-12 DIAGNOSIS — I5043 Acute on chronic combined systolic (congestive) and diastolic (congestive) heart failure: Secondary | ICD-10-CM | POA: Diagnosis not present

## 2023-11-12 DIAGNOSIS — Z7984 Long term (current) use of oral hypoglycemic drugs: Secondary | ICD-10-CM | POA: Diagnosis not present

## 2023-11-12 DIAGNOSIS — Z87891 Personal history of nicotine dependence: Secondary | ICD-10-CM | POA: Insufficient documentation

## 2023-11-12 DIAGNOSIS — Z85038 Personal history of other malignant neoplasm of large intestine: Secondary | ICD-10-CM | POA: Diagnosis not present

## 2023-11-12 DIAGNOSIS — Z860100 Personal history of colon polyps, unspecified: Secondary | ICD-10-CM | POA: Diagnosis not present

## 2023-11-12 DIAGNOSIS — E119 Type 2 diabetes mellitus without complications: Secondary | ICD-10-CM | POA: Insufficient documentation

## 2023-11-12 DIAGNOSIS — Z1211 Encounter for screening for malignant neoplasm of colon: Secondary | ICD-10-CM | POA: Diagnosis not present

## 2023-11-12 DIAGNOSIS — I509 Heart failure, unspecified: Secondary | ICD-10-CM | POA: Diagnosis not present

## 2023-11-12 DIAGNOSIS — Y838 Other surgical procedures as the cause of abnormal reaction of the patient, or of later complication, without mention of misadventure at the time of the procedure: Secondary | ICD-10-CM | POA: Insufficient documentation

## 2023-11-12 DIAGNOSIS — K91840 Postprocedural hemorrhage and hematoma of a digestive system organ or structure following a digestive system procedure: Secondary | ICD-10-CM | POA: Diagnosis not present

## 2023-11-12 DIAGNOSIS — I10 Essential (primary) hypertension: Secondary | ICD-10-CM

## 2023-11-12 HISTORY — PX: FLEXIBLE SIGMOIDOSCOPY: SHX5431

## 2023-11-12 LAB — GLUCOSE, CAPILLARY: Glucose-Capillary: 120 mg/dL — ABNORMAL HIGH (ref 70–99)

## 2023-11-12 SURGERY — SIGMOIDOSCOPY, FLEXIBLE
Anesthesia: General

## 2023-11-12 MED ORDER — LIDOCAINE 2% (20 MG/ML) 5 ML SYRINGE
INTRAMUSCULAR | Status: DC | PRN
  Administered 2023-11-12: 50 mg via INTRAVENOUS

## 2023-11-12 MED ORDER — PROPOFOL 10 MG/ML IV BOLUS
INTRAVENOUS | Status: DC | PRN
  Administered 2023-11-12: 50 mg via INTRAVENOUS
  Administered 2023-11-12 (×2): 20 mg via INTRAVENOUS
  Administered 2023-11-12: 70 mg via INTRAVENOUS
  Administered 2023-11-12: 20 mg via INTRAVENOUS

## 2023-11-12 MED ORDER — LACTATED RINGERS IV SOLN: INTRAVENOUS | Status: DC

## 2023-11-12 MED ORDER — LACTATED RINGERS IV SOLN: INTRAVENOUS | Status: DC | PRN

## 2023-11-12 MED ORDER — PHENYLEPHRINE 80 MCG/ML (10ML) SYRINGE FOR IV PUSH (FOR BLOOD PRESSURE SUPPORT)
PREFILLED_SYRINGE | INTRAVENOUS | Status: DC | PRN
  Administered 2023-11-12: 80 ug via INTRAVENOUS

## 2023-11-12 MED ORDER — GLUCAGON HCL RDNA (DIAGNOSTIC) 1 MG IJ SOLR
INTRAMUSCULAR | Status: DC | PRN
  Administered 2023-11-12: .5 mg via INTRAVENOUS

## 2023-11-12 NOTE — Interval H&P Note (Signed)
 History and Physical Interval Note:  11/12/2023 2:48 PM  [name] [name]  has presented today for surgery, with the diagnosis of h/o colon polyp with invasive carcinoma.  The various methods of treatment have been discussed with the patient and family. After consideration of risks, benefits and other options for treatment, the patient has consented to  Procedure(s) with comments: SIGMOIDOSCOPY, FLEXIBLE (N/A) - 230pm, asa 3, has defibrillator as a surgical intervention.  The patient's history has been reviewed, patient examined, no change in status, stable for surgery.  I have reviewed the patient's chart and labs.  Questions were answered to the patient's satisfaction.     Garnette Ka  Last Eliquis  5/13.  Here for sigmoidoscopy to look at the rectosigmoid polypectomy site per plan.  Discussed at length with patient and daughter.  Risk benefits limitations have been reviewed further recommendations to follow.  All questions answered.  All parties agreeable.

## 2023-11-12 NOTE — Interval H&P Note (Signed)
 History and Physical Interval Note:  11/12/2023 2:59 PM  [name] [name]  has presented today for surgery, with the diagnosis of h/o colon polyp with invasive carcinoma.  The various methods of treatment have been discussed with the patient and family. After consideration of risks, benefits and other options for treatment, the patient has consented to  Procedure(s) with comments: SIGMOIDOSCOPY, FLEXIBLE (N/A) - 230pm, asa 3, has defibrillator as a surgical intervention.  The patient's history has been reviewed, patient examined, no change in status, stable for surgery.  I have reviewed the patient's chart and labs.  Questions were answered to the patient's satisfaction.     [name] [name]  No change.  Flexible sigmoidoscopy per plan.  Last Eliquis  5/13.  Discussed risk benefits limitations with patient.  Questions been answered daughter present.  All her questions were answered as well.

## 2023-11-12 NOTE — Transfer of Care (Signed)
 Immediate Anesthesia Transfer of Care Note  Patient: [name] [name]  Procedure(s) Performed: Marlynn Singer  Patient Location: Short Stay  Anesthesia Type:General  Level of Consciousness: awake  Airway & Oxygen  Therapy: Patient Spontanous Breathing  Post-op Assessment: Report given to RN  Post vital signs: Reviewed and stable  Last Vitals:  Vitals Value Taken Time  BP 100/49 11/12/23 1530  Temp 36.7 C 11/12/23 1530  Pulse 72 11/12/23 1530  Resp 20 11/12/23 1530  SpO2 94 % 11/12/23 1530    Last Pain:  Vitals:   11/12/23 1530  TempSrc: Oral  PainSc: 0-No pain         Complications: No notable events documented.

## 2023-11-12 NOTE — Op Note (Signed)
 [facility] Patient Name: [name] [name] Procedure Date: 11/12/2023 2:42 PM MRN: [id number] Date of Birth: August 27, 1945 Attending MD: Gemma Kelp , MD, [id number] CSN: [id number] Age: 78 Admit Type: Outpatient Procedure:                Flexible Sigmoidoscopy Indications:              High risk colon cancer surveillance: Personal                            history of colon cancer Providers:                Gemma Kelp, MD, Alisa App, Crystal                            Page, Jolee Naval, Technician Referring MD:              Medicines:                Propofol  per Anesthesia Complications:            No immediate complications. Estimated Blood Loss:     Estimated blood loss was minimal. Procedure:                Pre-Anesthesia Assessment:                           - Prior to the procedure, a History and Physical                            was performed, and patient medications and                            allergies were reviewed. The patient's tolerance of                            previous anesthesia was also reviewed. The risks                            and benefits of the procedure and the sedation                            options and risks were discussed with the patient.                            All questions were answered, and informed consent                            was obtained. Prior Anticoagulants: The patient                            last took Eliquis  (apixaban ) 3 days prior to the                            procedure. ASA Grade Assessment: III - A patient  with severe systemic disease. After reviewing the                            risks and benefits, the patient was deemed in                            satisfactory condition to undergo the procedure.                           After obtaining informed consent, the scope was                            passed under direct vision. The [id number]                             scope was introduced through the anus and advanced                            to the the rectosigmoid junction. The flexible                            sigmoidoscopy was accomplished without difficulty.                            The patient tolerated the procedure well. The                            quality of the bowel preparation was adequate. Scope In: 3:07:40 PM Scope Out: 3:24:25 PM Total Procedure Duration: 0 hours 16 minutes 45 seconds  Findings:      Polypectomy site identified at 20 cm with the aid of previously placed       tattoo. I saw no evidence of residual polyp. Did see evidence of scar       tissue. Biopsy x 4. Painted area with Purastat maintaining good       hemostasis. Rectum including retroflexion demonstrated no other       abnormalities. Impression:               Polypectomy scar identified rectosigmoid. Status                            post biopsy.- No specimens collected. Moderate Sedation:      Moderate (conscious) sedation was personally administered by an       anesthesia professional. The following parameters were monitored: oxygen        saturation, heart rate, blood pressure, respiratory rate, EKG, adequacy       of pulmonary ventilation, and response to care. Recommendation:           - Return to my office (date not yet determined).                            Follow-up on pathology. Resume Eliquis  on 5/18.                            Further recommendations to follow. Discussed at  length with multiple family members and patient. Procedure Code(s):        --- Professional ---                           [phone], 52, Sigmoidoscopy, flexible; diagnostic,                            including collection of specimen(s) by brushing or                            washing, when performed (separate procedure) Diagnosis Code(s):        --- Professional ---                           U04.540, Personal history of other malignant                             neoplasm of large intestine CPT copyright 2022 American Medical Association. All rights reserved. The codes documented in this report are preliminary and upon coder review may  be revised to meet current compliance requirements. Windsor Hatcher. Jerusalem Brownstein, MD Gemma Kelp, MD 11/12/2023 4:23:52 PM This report has been signed electronically. Number of Addenda: 0

## 2023-11-12 NOTE — Discharge Instructions (Signed)
  Colonoscopy Discharge Instructions  Read the instructions outlined below and refer to this sheet in the next few weeks. These discharge instructions provide you with general information on caring for yourself after you leave the hospital. Your doctor may also give you specific instructions. While your treatment has been planned according to the most current medical practices available, unavoidable complications occasionally occur. If you have any problems or questions after discharge, call Dr. Riley Cheadle at [phone]. ACTIVITY You may resume your regular activity, but move at a slower pace for the next 24 hours.  Take frequent rest periods for the next 24 hours.  Walking will help get rid of the air and reduce the bloated feeling in your belly (abdomen).  No driving for 24 hours (because of the medicine (anesthesia) used during the test).   Do not sign any important legal documents or operate any machinery for 24 hours (because of the anesthesia used during the test).  NUTRITION Drink plenty of fluids.  You may resume your normal diet as instructed by your doctor.  Begin with a light meal and progress to your normal diet. Heavy or fried foods are harder to digest and may make you feel sick to your stomach (nauseated).  Avoid alcoholic beverages for 24 hours or as instructed.  MEDICATIONS You may resume your normal medications unless your doctor tells you otherwise.  WHAT YOU CAN EXPECT TODAY Some feelings of bloating in the abdomen.  Passage of more gas than usual.  Spotting of blood in your stool or on the toilet paper.  IF YOU HAD POLYPS REMOVED DURING THE COLONOSCOPY: No aspirin  products for 7 days or as instructed.  No alcohol  for 7 days or as instructed.  Eat a soft diet for the next 24 hours.  FINDING OUT THE RESULTS OF YOUR TEST Not all test results are available during your visit. If your test results are not back during the visit, make an appointment with your caregiver to find out the  results. Do not assume everything is normal if you have not heard from your caregiver or the medical facility. It is important for you to follow up on all of your test results.  SEEK IMMEDIATE MEDICAL ATTENTION IF: You have more than a spotting of blood in your stool.  Your belly is swollen (abdominal distention).  You are nauseated or vomiting.  You have a temperature over 101.  You have abdominal pain or discomfort that is severe or gets worse throughout the day.    The prior polypectomy site look good.  No evidence of residual polyp or tumor to the naked eye.  The site was biopsied as discussed   resume Eliquis  5/18   further recommendations to follow pending review of pathology report

## 2023-11-12 NOTE — Anesthesia Postprocedure Evaluation (Signed)
 Anesthesia Post Note  Patient: [name] [name]  Procedure(s) Performed: Marlynn Singer  Patient location during evaluation: Phase II Anesthesia Type: General Level of consciousness: awake Pain management: pain level controlled Vital Signs Assessment: post-procedure vital signs reviewed and stable Respiratory status: spontaneous breathing and respiratory function stable Cardiovascular status: blood pressure returned to baseline and stable Postop Assessment: no headache and no apparent nausea or vomiting Anesthetic complications: no Comments: Late entry   No notable events documented.   Last Vitals:  Vitals:   11/12/23 1323 11/12/23 1530  BP: 115/64 (!) 100/49  Pulse:  72  Resp: 18 20  Temp: 36.7 C 36.7 C  SpO2: 97% 94%    Last Pain:  Vitals:   11/12/23 1530  TempSrc: Oral  PainSc: 0-No pain                 Coretha Dew

## 2023-11-12 NOTE — Progress Notes (Signed)
 EPIC Encounter for ICM Monitoring  Patient Name: [name] [name] is a 78 y.o. male Date: 11/12/2023 Primary Care Physican: Orest Bio, MD Primary Cardiologist: [facility]        Electrophysiologist: Camnitz Bi-V Pacing: 95.6%         09/21/2022 Weight: 213 lbs 10/06/2022 Weight: 219 lbs  11/17/2022 Weight: 212.5 lbs 12/22/2022 Weight: 211 lbs 01/13/2023 Weight: 209 lbs 05/14/2023 Office Weight: 204 lbs 12/31/024 Weight: 207.5 lbs 07/13/2023 Weight: 212 lbs 08/04/2023 Weight: 213 lbs 10/21/2023 Weight: 200 lbs   Clinical Status Since 01-Nov-2023 Time in AT/AF  0.0 hr/day (0.0%)                                              Transmission results reviewed.  Sigmoidoscopy on 5/16.   Optivol thoracic impedance suggesting normal fluid levels but were some days with possible fluid accumulation within the last month.      Prescribed:  Torsemide  20 mg take 1 tablet(s) (20 mg total) by mouth once a day.     Labs: 10/20/2023 Creatinine 1.25, BUN 20, Potassium 4.6, Sodium 141, GFR 59 10/04/2023 Creatinine 1.33, BUN 21, Potassium 4.2, Sodium 138, GFR 54, BNP 5,155  09/28/2023 Creatinine 1.17, BUN 23, Potassium 4.2, Sodium 140, GFR 63, BNP 5,442 09/20/2023 Creatinine 1.17, BUN 18, Potassium 3.3, Sodium 137  06/18/2023 Creatinine 1.26, BUN 19, Potassium 4.4, Sodium 142, GFR 58, BNP 428.5 05/14/2023 Creatinine 1.30, BUN 20, Potassium 4.2, Sodium 142, GFR 56 05/05/2023 Creatinine 1.30, BUN 18, Potassium 4.3, Sodium 142, GFR 56, BNP 398.0 04/27/2023 Creatinine 1.12, BUN 14, Potassium 4.5, Sodium 141, GFR 67, BNP 571.5 A complete set of results can be found in Results Review.   Recommendations:  No changes.        Follow-up plan: ICM clinic phone appointment on 12/13/2023.   91 day device clinic remote transmission 01/31/2024.     EP/Cardiology Office Visits:   12/03/2023 with [name], NP.    Recall 09/22/2024 with Dr Lawana Pray.      Copy of ICM check sent to Dr. Lawana Pray.   3 month ICM trend:  11/08/2023.    12-14 Month ICM trend:     Almyra Jain, RN 11/12/2023 1:05 PM

## 2023-11-12 NOTE — Anesthesia Preprocedure Evaluation (Signed)
Anesthesia Evaluation  Patient identified by MRN, date of birth, ID band Patient awake    Reviewed: Allergy & Precautions, H&P , NPO status , Patient's Chart, lab work & pertinent test results, reviewed documented beta blocker date and time   Airway Mallampati: II  TM Distance: >3 FB Neck ROM: full    Dental no notable dental hx.    Pulmonary neg pulmonary ROS, shortness of breath, sleep apnea , former smoker   Pulmonary exam normal breath sounds clear to auscultation       Cardiovascular Exercise Tolerance: Good hypertension, + CAD, + Peripheral Vascular Disease and +CHF  negative cardio ROS + dysrhythmias + Cardiac Defibrillator  Rhythm:regular Rate:Normal     Neuro/Psych CVA negative neurological ROS  negative psych ROS   GI/Hepatic negative GI ROS, Neg liver ROS,,,  Endo/Other  negative endocrine ROSdiabetes    Renal/GU negative Renal ROS  negative genitourinary   Musculoskeletal   Abdominal   Peds  Hematology negative hematology ROS (+) Blood dyscrasia, anemia   Anesthesia Other Findings   Reproductive/Obstetrics negative OB ROS                             Anesthesia Physical Anesthesia Plan  ASA: 3  Anesthesia Plan: General   Post-op Pain Management:    Induction:   PONV Risk Score and Plan: Propofol infusion  Airway Management Planned:   Additional Equipment:   Intra-op Plan:   Post-operative Plan:   Informed Consent: I have reviewed the patients History and Physical, chart, labs and discussed the procedure including the risks, benefits and alternatives for the proposed anesthesia with the patient or authorized representative who has indicated his/her understanding and acceptance.     Dental Advisory Given  Plan Discussed with: CRNA  Anesthesia Plan Comments:        Anesthesia Quick Evaluation

## 2023-11-15 NOTE — Telephone Encounter (Signed)
 Please review and advise as last provider to see pt

## 2023-11-15 NOTE — Telephone Encounter (Signed)
 Pt following up

## 2023-11-16 ENCOUNTER — Encounter (HOSPITAL_COMMUNITY): Payer: Self-pay | Admitting: Internal Medicine

## 2023-11-16 LAB — SURGICAL PATHOLOGY

## 2023-11-17 ENCOUNTER — Ambulatory Visit: Payer: Self-pay | Admitting: Internal Medicine

## 2023-11-17 DIAGNOSIS — Z8673 Personal history of transient ischemic attack (TIA), and cerebral infarction without residual deficits: Secondary | ICD-10-CM | POA: Diagnosis not present

## 2023-11-17 DIAGNOSIS — R2689 Other abnormalities of gait and mobility: Secondary | ICD-10-CM | POA: Diagnosis not present

## 2023-11-17 DIAGNOSIS — H938X1 Other specified disorders of right ear: Secondary | ICD-10-CM | POA: Diagnosis not present

## 2023-11-17 DIAGNOSIS — H9193 Unspecified hearing loss, bilateral: Secondary | ICD-10-CM | POA: Diagnosis not present

## 2023-11-17 DIAGNOSIS — Z9622 Myringotomy tube(s) status: Secondary | ICD-10-CM | POA: Diagnosis not present

## 2023-11-17 DIAGNOSIS — Z87828 Personal history of other (healed) physical injury and trauma: Secondary | ICD-10-CM | POA: Diagnosis not present

## 2023-11-19 DIAGNOSIS — R5383 Other fatigue: Secondary | ICD-10-CM | POA: Diagnosis not present

## 2023-11-19 DIAGNOSIS — R531 Weakness: Secondary | ICD-10-CM | POA: Diagnosis not present

## 2023-11-29 DIAGNOSIS — R531 Weakness: Secondary | ICD-10-CM | POA: Diagnosis not present

## 2023-11-29 DIAGNOSIS — R5383 Other fatigue: Secondary | ICD-10-CM | POA: Diagnosis not present

## 2023-12-02 DIAGNOSIS — R531 Weakness: Secondary | ICD-10-CM | POA: Diagnosis not present

## 2023-12-02 DIAGNOSIS — R5383 Other fatigue: Secondary | ICD-10-CM | POA: Diagnosis not present

## 2023-12-02 NOTE — Progress Notes (Unsigned)
 Cardiology Office Note    Date:  12/03/2023  ID:  [name], DOB Nov 30, 1945, MRN [id number] PCP:  Orest Bio, MD  Cardiologist:  Hazle Lites, MD  Electrophysiologist:  Lei Pump, MD   Chief Complaint: Follow up for HFrEF   History of Present Illness: .    BRAXSON [name] is a 78 y.o. male with visit-pertinent history of CAD s/p CABG in 2013 with ischemic cardiomyopathy-EF 20-25% prior to surgery, improved to 40-45% by 10/2012, now 30-35%, mild MR, PAD, DM and PAF on chronic anticoagulation s/p ablation. Reduction in EF led to ICD insertion in 2023.      Heart catheterization in 08/2013 showed patent LIMA to LAD, SVG-PLA, SVG-PDA.  Most recent heart catheterization in 11/2021 showed patent LIMA to LAD but occlusion of both SVG grafts.  Given wall motion abnormality on echocardiogram, viability study was recommended to help make decisions regarding complex atherectomy and intervention of the left circumflex that would involve stenting back of the left main and covering the long segment throughout the circumflex to treat multiple severe calcific lesions.  Cardiac MRI was pursued and showed EF 20% and severe thinning in the apical to basal lateral wall and apical inferior wall suggesting nonviability in these regions.  Therefore complex intervention was not pursued.  He was treated medically.  Given persistent reduction in EF he underwent BiV ICD insertion CRT-D on 04/30/2022.   In March 2024 he reported worsening shortness of breath.  Device interrogation showed possible fluid accumulation in atrial fibrillation.  Diuretic was increased and EKG showed underlying A-fib with ventricularly paced rhythm and PVCs.  He presented for scheduled outpatient cardioversion on 10/16/2022 and was successfully cardioverted to a paced BiV rhythm.  He was seen by Dr. Lawana Pray on 10/27/2022 and was back in afib, he was started on amiodarone .   Cardiology was consulted in 10/2022 while patient was  admitted in setting of mechanical fall resulting in subdural hematoma 4 mm without any mass effect.  After discharge he fell again, he resumed Eliquis  on 11/24/22. On 12/23/2022 he presented for DCCV which was successful.  On 01/19/23 he underwent afib ablation with Dr. Lawana Pray, on follow up with afib clinic on 8/20 he reported no further afib. He was last seen in clinic on 02/26/23 by Dr. Maximo Spar, it was noted that he had lost 50 lbs over the past year, he had an abnormal cologuard test with his PCP.      On 04/17/23 patient underwent colonoscopy, was found to have multiple scattered diverticula as well as 3 polyps that were clipped with cold snare's.  Following he had normal bowel movements until 2 days after colonoscopy when he noticed bright red blood from his rectum during bowel movements.  He presented to the ED on 04/19/2023, blood pressures were soft and stools positive for blood.  His hemoglobin at baseline is around 11, decreased to 8.3.  He received 1 unit of packed red blood cells, hemoglobin proved to 9.4.  He underwent repeat colonoscopy while inpatient showing a single post polypectomy ulcer in the cecum.  He had multiple clips placed at previous polypectomy.  While inpatient he was found to be orthostatic, he was given a 500 mL fluid bolus, his Lasix  was held while inpatient.  He was started on midodrine  5 mg 3 times daily.  His Coreg  was discontinued, he was instructed to resume Eliquis  on 04/26/2023.   On 04/22/2023 he had a device check which showed 0% A-fib burden,  OptiVol thoracic impedance suggested possible fluid accumulation starting 10/3 and becoming worse during and following hospital discharge on 10/23.  Following discharge he was restarted on his furosemide  40 mg daily with ICM clinic phone appointment on 05/03/2023 to recheck fluid levels.   He was seen in clinic on 04/27/23. He felt he was improving, noted that he felt significantly better than he did the week prior.  He reported that he  had stopped midodrine  as he was noting intermittent headaches and difficulty with urination.  With lower extremity edema noted on exam and thoracic impedance noted possible fluid accumulation his Lasix  was increased to 40 mg in the morning and afternoon for 4 days. Patient had noted improvement in fluid status on follow up however, on 06/07/2023 he notified the office of feeling increasingly short of breath with minimal exertion, tired with small amount of leg swelling and a 3 to 4 pound weight gain over the last couple of weeks.  His weight was 209 pounds, prior weight at office visit 204 pounds.  Patient reported he been taking furosemide  40 mg twice a day for 4 days with no relief of symptoms.  OptiVol suggested possible fluid accumulation since 11/15.  Patient completed Furoscix  for one dose and reported improvement.   Patient was last in clinic on 09/03/2023 by Dr. Maximo Spar.  Patient had recently returned from [place] , was planning to undergo sigmoidoscopy with Dr. Riley Cheadle.  Patient reported he had some shortness of breath and still gets weak and fatigued with exertion.  Patient reported that his shortness of breath had not been improving in the prior few days.  Patient was advised that he could proceed with sigmoidoscopy acceptable risk.  On 09/21/2023 patient notified the office that his procedure had been canceled, he noted that his shortness of breath had worsened and was seen by his PCP who noted that he had a good amount of fluid in 1 lung and some of the other.  Patient's morning Lasix  was doubled and he was taking an additional pill in the afternoon.  Patient was seen in clinic on 09/29/2023, he reported increased shortness of breath, he had increased his Lasix  with some improvement in symptoms.  However he continued to endorse dyspnea with exertion and increased lower extremity edema and some mild orthopnea.  Patient was started on Furoscix  for 3 doses.  Patient's ICM monitoring on/12/2023 suggested normal  fluid levels since 3/26 with exception of possible fluid accumulation from 3/18 through 3/25.  Patient was seen in clinic on/03/2024.  He reported he was feeling significantly better.  Patient was transitioned to torsemide  and instructed to discontinue Lasix .  On May 16 patient underwent repeat colonoscopy for monitoring, to have another in 1 year.  Patient reports that his torsemide  was held a few days prior to procedure and he did have a slight weight increase as well as increased shortness of breath.  His torsemide  was increased to 40 mg, patient reports he did this for 2 days with quick resolution in symptoms.  Today he presents for follow-up.  He reports that he has been doing well overall, notes that he simply "does not feel good."  He notes that he does not feel bad but continues to have low energy and decreased appetite.  He does note that he has started attending physical therapy and last week was tolerating this very well, he also went out to lunches and dinner last week and enjoyed this.  His daughter notes that he continues to have a very  low appetite, he notes that he simply does not have a drive to eat food.  They also note that his diet is typically low in protein, mainly eating vegetables, sandwiches and cereal.  Patient reports that he is tolerating torsemide  well, does not feel that he has had significant weight changes until the last 3 days, notes that he did have a slight increase in his weight and has a slight feeling of shallow breathing however denies frank shortness of breath.  He continues to use his BiPAP at night which she feels combats orthopnea.  He notes that his legs are slightly puffy, does not feel they are significantly swollen.   ROS: .   Today he denies chest pain, palpitations, melena, hematuria, hemoptysis, diaphoresis, weakness, presyncope, syncope, orthopnea, and PND.  All other systems are reviewed and otherwise negative. Studies Reviewed: Aaron Aas   EKG:  EKG is not ordered  today.  CV Studies: Cardiac studies reviewed are outlined and summarized above. Otherwise please see EMR for full report. Cardiac Studies & Procedures   ______________________________________________________________________________________________ CARDIAC CATHETERIZATION  CARDIAC CATHETERIZATION 12/15/2021  Conclusion 1.  Severe native coronary artery disease with patency of the left main, total occlusion of the LAD just after the first diagonal, multiple severe calcific stenoses throughout the entirety of the circumflex distribution, and moderate stenoses in the RCA 2.  Status post CABG with continued patency of the LIMA to LAD, and interval occlusion of the SVG to PDA and SVG to PLA 3.  Normal LVEDP  Heart team review, consider medical therapy versus complex atherectomy and intervention of the left circumflex which would involve stenting back into the left main and covering a long segment throughout the circumflex in order to treat multiple severe calcific lesions.  Note that the patient had akinesis of the inferior and posterior walls on his echo and hypokinesis of the anterior wall segments.  He does not have angina.  Might be appropriate to perform a viability study.  Will discuss his case with Dr. Maximo Spar.  Findings Coronary Findings Diagnostic  Dominance: Right  Left Main Ost LM to LM lesion is 30% stenosed. The lesion is discrete. The lesion is moderately calcified.  Left Anterior Descending Vessel is small. Ost LAD to Prox LAD lesion is 50% stenosed. Mid LAD lesion is 100% stenosed. The lesion is severely calcified.  Left Circumflex Vessel is small. The entire circumflex vessel is severely calcified.  There is high-grade 95% stenosis at the ostium, severe eccentric 95% stenosis in the proximal vessel, severe 90% stenosis in the mid vessel, and severe 90% stenosis in the distal vessel just before it branches into 2 OM&#39;s Ost Cx lesion is 95% stenosed. The lesion is  calcified. Prox Cx to Mid Cx lesion is 90% stenosed. The lesion is eccentric. The lesion is severely calcified. Mid Cx to Dist Cx lesion is 90% stenosed. The lesion is discrete.  Right Coronary Artery Prox RCA lesion is 60% stenosed. The lesion is type C and discrete. The lesion is severely calcified. Dist RCA lesion is 50% stenosed.  Right Posterior Descending Artery Vessel is small in size.  Right Posterior Atrioventricular Artery Vessel is small in size. RPAV lesion is 100% stenosed.  Graft To RPDA And is moderate in size.  The graft exhibits minimal luminal irregularities. Origin to Prox Graft lesion is 100% stenosed.  Graft To RPAV And is small.  The graft exhibits no disease. Origin to Prox Graft lesion is 100% stenosed.  LIMA LIMA Graft To Dist LAD LIMA  and is moderate in size.  The graft exhibits no disease. There is competitive flow. The LIMA to LAD graft is widely patent with no stenosis.  This fills the entirety of the LAD as it is occluded just beyond the first diagonal branch  Intervention  No interventions have been documented.   CARDIAC CATHETERIZATION  CARDIAC CATHETERIZATION 09/13/2015  Conclusion  SVG was injected is moderate in size. numerous valves  The graft exhibits minimal luminal irregularities.  SVG was injected is small, and is anatomically normal.  Prox RCA lesion, 85% stenosed.  Prox LAD lesion, 100% stenosed.  Pedicle LIMA was injected is moderate in size, and is anatomically normal.  There is competitive flow.  Mid Cx to Dist Cx lesion, 30% stenosed.  Post Atrio lesion, 100% stenosed.  Ost LM to LM lesion, 30% stenosed.  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 Patent LIMA-LAD, SVG-PDA and SVG-PLB grafts with TIMI III flow and good distal runoff. LVEDP = 16 mmHg. Moderately reduced CO by Fick of 4.8 L/min and CI of 2 L/min, RA of 6 and PA mean of 29 mmHg. Compensated right and left heart  pressures. LVEF 30-35% with global hypokinesis and inferior akinesis.  Hazle Lites, MD, [facility] Attending Cardiologist [facility]  Findings Coronary Findings Diagnostic  Dominance: Right  Left Main Moderately Calcified discrete.  Left Anterior Descending . Vessel is small. Severely Calcified.  Left Circumflex . Vessel is small. Discrete.  Right Coronary Artery The lesion is type C Discrete.  Right Posterior Descending Artery The vessel is small in size.  Right Posterior Atrioventricular Artery The vessel is small in size.  Single Graft Graft To RPDA SVG was injected is moderate in size. numerous valves  The graft exhibits minimal luminal irregularities.  Single Graft Graft To RPAV SVG was injected is small, and is anatomically normal.  LIMA LIMA Graft To Dist LAD LIMA was injected is moderate in size, and is anatomically normal. There is competitive flow.  Intervention  No interventions have been documented.   STRESS TESTS  NM MYOCAR MULTI W/SPECT W 10/20/2010   ECHOCARDIOGRAM  ECHOCARDIOGRAM COMPLETE 02/19/2023  Narrative ECHOCARDIOGRAM REPORT    Patient Name:   DREAM NODAL Date of Exam: 02/19/2023 Medical Rec #:  [id number]      Height:       74.0 in Accession #:    [id number]     Weight:       205.2 lb Date of Birth:  1945-07-31     BSA:          2.197 m Patient Age:    48 years       BP:           110/59 mmHg Patient Gender: M              HR:           67 bpm. Exam Location:  [facility]  Procedure: 2D Echo, Cardiac Doppler and Color Doppler  Indications:    Congestive Heart Failure I50.9  History:        Patient has prior history of Echocardiogram examinations, most recent 11/10/2022. Cardiomyopathy and CHF, CAD, Prior CABG and Defibrillator, Stroke, Arrythmias:Atrial Fibrillation; Risk Factors:Diabetes, Hypertension and Dyslipidemia.  Sonographer:    Denese Finn RCS Referring Phys: [phone] KENNETH C [name]  IMPRESSIONS   1. Left  ventricular ejection fraction, by estimation, is 30 to 35%. The left ventricle has moderately decreased function. The left ventricle  demonstrates regional wall motion abnormalities (see scoring diagram/findings for description). The left ventricular internal cavity size was moderately dilated. There is mild asymmetric left ventricular hypertrophy of the basal segment. Left ventricular diastolic parameters are consistent with Grade III diastolic dysfunction (restrictive). 2. Right ventricular systolic function is mildly reduced. The right ventricular size is normal. There is severely elevated pulmonary artery systolic pressure. The estimated right ventricular systolic pressure is 60.1 mmHg. 3. Left atrial size was mildly dilated. 4. Right atrial size was mildly dilated. 5. The mitral valve is abnormal with restricted posterior leaflet. Mild mitral valve regurgitation. 6. Tricuspid valve regurgitation is mild to moderate. 7. The aortic valve is tricuspid. Aortic valve regurgitation is not visualized. 8. The inferior vena cava is dilated in size with >50% respiratory variability, suggesting right atrial pressure of 8 mmHg.  Comparison(s): Prior images reviewed side by side. LVEF 30-35% with wall motion abnormalities consistent with ischemic cardiomyopathy. Mild mitral regurgitation. Severely elevated estimated RVSP of 60 mmHg.  FINDINGS Left Ventricle: Left ventricular ejection fraction, by estimation, is 30 to 35%. The left ventricle has moderately decreased function. The left ventricle demonstrates regional wall motion abnormalities. The left ventricular internal cavity size was moderately dilated. There is mild asymmetric left ventricular hypertrophy of the basal segment. Left ventricular diastolic parameters are consistent with Grade III diastolic dysfunction (restrictive).   LV Wall Scoring: The antero-lateral wall and posterior wall are akinetic. The mid and distal anterior septum, inferior  septum, apical lateral segment, and apex are hypokinetic. The basal anteroseptal segment is normal.  Right Ventricle: The right ventricular size is normal. No increase in right ventricular wall thickness. Right ventricular systolic function is mildly reduced. There is severely elevated pulmonary artery systolic pressure. The tricuspid regurgitant velocity is 3.61 m/s, and with an assumed right atrial pressure of 8 mmHg, the estimated right ventricular systolic pressure is 60.1 mmHg.  Left Atrium: Left atrial size was mildly dilated.  Right Atrium: Right atrial size was mildly dilated.  Pericardium: There is no evidence of pericardial effusion.  Mitral Valve: The mitral valve is abnormal. Mild mitral valve regurgitation. MV peak gradient, 7.8 mmHg. The mean mitral valve gradient is 2.0 mmHg.  Tricuspid Valve: The tricuspid valve is grossly normal. Tricuspid valve regurgitation is mild to moderate.  Aortic Valve: The aortic valve is tricuspid. There is mild aortic valve annular calcification. Aortic valve regurgitation is not visualized.  Pulmonic Valve: The pulmonic valve was grossly normal. Pulmonic valve regurgitation is trivial.  Aorta: The aortic root is normal in size and structure.  Venous: The inferior vena cava is dilated in size with greater than 50% respiratory variability, suggesting right atrial pressure of 8 mmHg.  IAS/Shunts: No atrial level shunt detected by color flow Doppler.  Additional Comments: A device lead is visualized.   LEFT VENTRICLE PLAX 2D LVIDd:         6.40 cm      Diastology LVIDs:         5.30 cm      LV e' medial:    3.06 cm/s LV PW:         0.80 cm      LV E/e' medial:  37.9 LV IVS:        1.20 cm      LV e' lateral:   5.12 cm/s LVOT diam:     2.00 cm      LV E/e' lateral: 22.7 LV SV:         57 LV SV  Index:   26 LVOT Area:     3.14 cm  LV Volumes (MOD) LV vol d, MOD A2C: 163.0 ml LV vol d, MOD A4C: 167.0 ml LV vol s, MOD A2C: 101.0 ml LV  vol s, MOD A4C: 111.0 ml LV SV MOD A2C:     62.0 ml LV SV MOD A4C:     167.0 ml LV SV MOD BP:      60.2 ml  RIGHT VENTRICLE RV S prime:     7.25 cm/s TAPSE (M-mode): 1.9 cm  LEFT ATRIUM              Index        RIGHT ATRIUM           Index LA diam:        4.70 cm  2.14 cm/m   RA Area:     24.60 cm LA Vol (A2C):   103.0 ml 46.88 ml/m  RA Volume:   75.10 ml  34.18 ml/m LA Vol (A4C):   56.4 ml  25.67 ml/m LA Biplane Vol: 78.1 ml  35.54 ml/m AORTIC VALVE LVOT Vmax:   75.70 cm/s LVOT Vmean:  48.500 cm/s LVOT VTI:    0.181 m  AORTA Ao Root diam: 3.30 cm  MITRAL VALVE                TRICUSPID VALVE MV Area (PHT): 4.31 cm     TR Peak grad:   52.1 mmHg MV Area VTI:   1.52 cm     TR Vmax:        361.00 cm/s MV Peak grad:  7.8 mmHg MV Mean grad:  2.0 mmHg     SHUNTS MV Vmax:       1.40 m/s     Systemic VTI:  0.18 m MV Vmean:      54.4 cm/s    Systemic Diam: 2.00 cm MV Decel Time: 176 msec MR Peak grad: 82.8 mmHg MR Mean grad: 49.0 mmHg MR Vmax:      455.00 cm/s MR Vmean:     318.0 cm/s MV E velocity: 116.00 cm/s MV A velocity: 41.10 cm/s MV E/A ratio:  2.82  Teddie Favre MD Electronically signed by Teddie Favre MD Signature Date/Time: 02/19/2023/10:45:43 AM    Final        CARDIAC MRI  MR CARDIAC MORPHOLOGY W WO CONTRAST 01/19/2022  Narrative CLINICAL DATA:  32M with CAD s/p CABG x3 (LIMa-LAD, SVG-PDA, SVG-PLA). Echo 12/05/21 showed EF 30-35%. Cath 12/15/21 showed patient LIMA-LAD, occluded SVG-PDA and SVG-PLA. Evaluate viability.  EXAM: CARDIAC MRI  TECHNIQUE: The patient was scanned on a 1.5 Tesla Siemens magnet. A dedicated cardiac coil was used. Functional imaging was done using Fiesta sequences. 2,3, and 4 chamber views were done to assess for RWMA's. Modified Simpson's rule using a short axis stack was used to calculate an ejection fraction on a dedicated work [employer]. The patient received 10 cc of Gadavist . After  10 minutes inversion recovery sequences were used to assess for infiltration and scar tissue.  CONTRAST:  10 cc  of Gadavist   FINDINGS: Left ventricle:  -Severe dilatation  -Severe systolic dysfunction. Thinning/akinesis of lateral and inferior walls. Lateral wall and apical inferior wall measures <4.1mm in thickness, suggesting nonviability.  -Elevated ECV (38%)  -No LGE  LV EF: 28% (Normal 56-78%)  Absolute volumes:  LV EDV: (Normal 77-195 mL)  LV ESV: (Normal 19-72 mL)  LV SV: 95mL (Normal 51-133 mL)  CO: 6.0L/min (Normal 2.8-8.8 L/min)  Indexed volumes:  LV EDV: 117mL/sq-m (Normal 47-92 mL/sq-m)  LV ESV: 156mL/sq-m (Normal 13-30 mL/sq-m)  LV SV: 32mL/sq-m (Normal 32-62 mL/sq-m)  CI: 2.7L/min/sq-m (Normal 1.7-4.2 L/min/sq-m)  Right ventricle: Mild dilatation with mild systolic dysfunction  RV EF:  16% (Normal 47-74%)  Absolute volumes:  RV EDV: (Normal 88-227 mL)  RV ESV: (Normal 23-103 mL)  RV SV: 96mL (Normal 52-138 mL)  CO: 6.1L/min (Normal 2.8-8.8 L/min)  Indexed volumes:  RV EDV: 143mL/sq-m (Normal 55-105 mL/sq-m)  RV ESV: 58mL/sq-m (Normal 15-43 mL/sq-m)  RV SV: 19mL/sq-m (Normal 32-64 mL/sq-m)  CI: 2.7L/min/sq-m (Normal 1.7-4.2 L/min/sq-m)  Left atrium: Mild enlargement  Right atrium: Mild enlargement  Mitral valve: Moderate mitral regurgitation (regurgitant fraction 27%)  Aortic valve: No regurgitation  Tricuspid valve: Moderate tricuspid regurgitation (regurgitant fraction 25%)  Pulmonic valve: No regurgitation  Aorta: Normal proximal ascending aorta  Pericardium: Normal  IMPRESSION: 1. Severe LV dilatation with severe systolic dysfunction (EF 28%). Thinning/akinesis of inferior and lateral walls.  2.  Mild RV dilatation with mild systolic dysfunction (EF 40%)  3. While no late gadolinium enhancement is seen, there is severe thinning (less than 4.59mm) in the apical to basal lateral wall  and apical inferior wall, suggesting nonviability in these regions.  4.  Moderate mitral regurgitation (regurgitant fraction 27%)  5.  Moderate tricuspid regurgitation (regurgitant fraction 25%)   Electronically Signed By: Carson Clara M.D. On: 01/20/2022 23:31   ______________________________________________________________________________________________       Current Reported Medications:.    Current Meds  Medication Sig   acetaminophen  (TYLENOL ) 500 MG tablet Take 500-1,000 mg by mouth every 6 (six) hours as needed (for pain.).   apixaban  (ELIQUIS ) 5 MG TABS tablet Take 1 tablet (5 mg total) by mouth 2 (two) times daily.   carvedilol  (COREG ) 3.125 MG tablet Take 1 tablet by mouth twice daily   empagliflozin  (JARDIANCE ) 10 MG TABS tablet Take 1 tablet (10 mg total) by mouth daily before breakfast.   gabapentin  (NEURONTIN ) 300 MG capsule Take 600 mg by mouth at bedtime.   glipiZIDE  (GLUCOTROL  XL) 5 MG 24 hr tablet Take 5 mg by mouth daily.   metFORMIN  (GLUCOPHAGE -XR) 500 MG 24 hr tablet Take 1,000 mg by mouth 2 (two) times daily.   ONETOUCH VERIO test strip 1 each daily.   rosuvastatin  (CRESTOR ) 10 MG tablet Take 1 tablet (10 mg total) by mouth every other day.   torsemide  (DEMADEX ) 20 MG tablet Take 1 tablet (20 mg total) by mouth daily.   [DISCONTINUED] JARDIANCE  25 MG TABS tablet Take 1 tablet by mouth once daily   Physical Exam:    VS:  BP 110/62   Pulse 62   Ht 6\' 2"  (1.88 m)   Wt 207 lb 9.6 oz (94.2 kg)   SpO2 97%   BMI 26.65 kg/m    Wt Readings from Last 3 Encounters:  12/03/23 207 lb 9.6 oz (94.2 kg)  11/12/23 203 lb 11.3 oz (92.4 kg)  11/10/23 203 lb 12.8 oz (92.4 kg)    GEN: Well nourished, well developed in no acute distress NECK: No JVD; No carotid bruits CARDIAC: RRR, no murmurs, rubs, gallops RESPIRATORY:  Clear to auscultation without rales, wheezing or rhonchi  ABDOMEN: Soft, non-tender, non-distended EXTREMITIES:  Mild bilateral lower  extremity edema, nonpitting; No acute deformity     Asessement and Plan:.    Chronic systolic heart failure: EF 28% by CT MRI in 12/2021, CRT-D inserted in 2023.  Echo on 02/19/2023 indicated LVEF of 30 to 35%, LV with moderately decreased function, LV demonstrating regional wall motion abnormalities, LV internal cavity size is moderately dilated, grade 3 diastolic dysfunction.  RV systolic function was mildly reduced.  There was severely elevated pulmonary artery systolic pressure. On 06/07/23 he noted increased shortness of breath, fatigue and lower extremity edema, he noted a 3 to 4 lb weight gain in the weeks prior. He was started on Furiosx and completed three injections with significant improvement.  Seen in clinic by Dr. Maximo Spar at the beginning of March and was doing well overall, OptiVol thoracic impedance suggested improvement in fluid accumulation.  Patient had chest x-ray completed by his PCP that indicated pleural effusions per report.  At office visit on 4/2 patient reported increased shortness of breath, mild orthopnea and increased lower extremity edema.  Patient was started on Furoscix  for 3 doses.  At last office visit he was transitioned from Lasix  to torsemide .  On follow-up patient reported he was doing well, shortness of breath continues to improve as well as lower extremity edema.  Patient felt that his dry weight now appear to be around 200 pounds. Patient and daughter continue to note concern regarding further weight loss and question if related to increased dose of Jardiance .  Patient reports that he has been doing well since his colonoscopy, did increase his dose of torsemide  for a few days following with significant improvement.  He reports that in the last 2 to 3 days he has had slight weight gain, puffy ankles and shallow breathing however denies frank shortness of breath.  Will have patient increase torsemide  to 40 mg for the next 2 to 3 days until he is back to his standard dry weight  and breathing has improved.  He continues to note decreased appetite and no drive to eat, he would like to trial reducing the dose of Jardiance  to see if any improvement.  Will start patient on Jardiance  10 mg daily. Encouraged patient to follow-up with physical therapy.  Encouraged patient to continue monitoring his weights at home and notify the office of weight gain of 2 to 3 pounds overnight or 5 pounds in a week, increased shortness of breath or lower extremity edema.  Discussed that patient may take an extra dose of torsemide  20 mg daily as needed for the above-mentioned.  Reviewed ED precautions. Check CBC and BMET.   Atrial fibrillation: Underwent DCCV in 04/2019 and 09/2022. Following cardioversion in April 2024 his ICD transmission suggested he had an early return to A-fib after. Underwent amiodarone  loading and DCCV on 12/23/2022. On 01/19/23 he underwent afib ablation with Dr. Lawana Pray. Amiodarone  discontinued in November, 2024. Patient denies any palpitations or feeling of increased heart rates.  Continue Eliquis  5 mg twice daily, he denies any bleeding problems. Check CBC and BMET.   GI bleeding/chronic anticoagulation: Patient had a colonoscopy on 04/17/2023, admitted with BR B PR on 04/18/23. He received 1 unit PRBC and IV fluids in setting of hypotension. Underwent repeat colonoscopy with further clips placed. Patient denied any further bleeding.  He received 2 iron infusions. He was restarted on Eliquis  5 mg twice daily. Patient underwent recent colonoscopy, tolerated well, no further intervention needed at this time. Check CBC and BMET.   CAD: S/p CABG in 2013.  Last heart catheterization on 11/2021 indicated occluded SVG to PDA and SVG to PLA, patent LIMA to LAD.  Recommended to treat medically. Stable with no anginal symptoms. No indication for ischemic evaluation.  No ASA given need for Eliquis .   Hypotension: Blood pressure today 110/62. Patient has midodrine  5 mg as needed for systolic  blood pressure less than 95.  Hyperlipidemia: On rosuvastatin  10 mg every other day.   OSA on Bipap: Patient reports nightly compliance.  Patient notes that he was previously followed by Dr. Micael Adas and has not seen her recently, will schedule follow-up with Dr. Micael Adas for OSA.   Disposition: F/u with Fallan Mccarey, NP in 8 weeks.   Signed, Jashun Puertas D Shamal Stracener, NP

## 2023-12-03 ENCOUNTER — Encounter: Payer: Self-pay | Admitting: Cardiology

## 2023-12-03 ENCOUNTER — Ambulatory Visit: Attending: Cardiology | Admitting: Cardiology

## 2023-12-03 ENCOUNTER — Ambulatory Visit: Admitting: Cardiology

## 2023-12-03 VITALS — BP 110/62 | HR 62 | Ht 74.0 in | Wt 207.6 lb

## 2023-12-03 DIAGNOSIS — G4733 Obstructive sleep apnea (adult) (pediatric): Secondary | ICD-10-CM | POA: Insufficient documentation

## 2023-12-03 DIAGNOSIS — I5022 Chronic systolic (congestive) heart failure: Secondary | ICD-10-CM | POA: Diagnosis not present

## 2023-12-03 DIAGNOSIS — I255 Ischemic cardiomyopathy: Secondary | ICD-10-CM | POA: Diagnosis not present

## 2023-12-03 DIAGNOSIS — Z951 Presence of aortocoronary bypass graft: Secondary | ICD-10-CM | POA: Insufficient documentation

## 2023-12-03 DIAGNOSIS — E785 Hyperlipidemia, unspecified: Secondary | ICD-10-CM | POA: Diagnosis not present

## 2023-12-03 DIAGNOSIS — Z95810 Presence of automatic (implantable) cardiac defibrillator: Secondary | ICD-10-CM | POA: Diagnosis not present

## 2023-12-03 DIAGNOSIS — I4819 Other persistent atrial fibrillation: Secondary | ICD-10-CM | POA: Insufficient documentation

## 2023-12-03 MED ORDER — EMPAGLIFLOZIN 10 MG PO TABS: 10.0000 mg | ORAL_TABLET | Freq: Every day | ORAL | 3 refills | Status: AC

## 2023-12-03 NOTE — Patient Instructions (Signed)
 Medication Instructions:  Increase Torsemide  to 40 mg for the next 2-3 days then go back to 20 mg  Jardiance  10 mg once a day  *If you need a refill on your cardiac medications before your next appointment, please call your pharmacy*  Lab Work: Today we are going to draw a CBC, and Bmet If you have labs (blood work) drawn today and your tests are completely normal, you will receive your results only by: MyChart Message (if you have MyChart) OR A paper copy in the mail If you have any lab test that is abnormal or we need to change your treatment, we will call you to review the results.  Testing/Procedures: No testing  Follow-Up: At [facility], you and your health needs are our priority.  As part of our continuing mission to provide you with exceptional heart care, our providers are all part of one team.  This team includes your primary Cardiologist (physician) and Advanced Practice Providers or APPs (Physician Assistants and Nurse Practitioners) who all work together to provide you with the care you need, when you need it.  Your next appointment:   8 week(s)  Provider:   [name], NP  We recommend signing up for the patient portal called "MyChart".  Sign up information is provided on this After Visit Summary.  MyChart is used to connect with patients for Virtual Visits (Telemedicine).  Patients are able to view lab/test results, encounter notes, upcoming appointments, etc.  Non-urgent messages can be sent to your provider as well.   To learn more about what you can do with MyChart, go to [email or web address].   Other Instructions Follow up with Dr. Micael Adas for the sleep clinic

## 2023-12-04 LAB — CBC
Hematocrit: 40.7 % (ref 37.5–51.0)
Hemoglobin: 13 g/dL (ref 13.0–17.7)
MCH: 27.9 pg (ref 26.6–33.0)
MCHC: 31.9 g/dL (ref 31.5–35.7)
MCV: 87 fL (ref 79–97)
Platelets: 147 10*3/uL — ABNORMAL LOW (ref 150–450)
RBC: 4.66 x10E6/uL (ref 4.14–5.80)
RDW: 16.1 % — ABNORMAL HIGH (ref 11.6–15.4)
WBC: 4.3 10*3/uL (ref 3.4–10.8)

## 2023-12-04 LAB — BASIC METABOLIC PANEL WITH GFR
BUN/Creatinine Ratio: 19 (ref 10–24)
BUN: 21 mg/dL (ref 8–27)
CO2: 24 mmol/L (ref 20–29)
Calcium: 8.8 mg/dL (ref 8.6–10.2)
Chloride: 98 mmol/L (ref 96–106)
Creatinine, Ser: 1.08 mg/dL (ref 0.76–1.27)
Glucose: 89 mg/dL (ref 70–99)
Potassium: 4.2 mmol/L (ref 3.5–5.2)
Sodium: 141 mmol/L (ref 134–144)
eGFR: 70 mL/min/{1.73_m2} (ref >59)

## 2023-12-05 ENCOUNTER — Ambulatory Visit: Payer: Self-pay | Admitting: Cardiology

## 2023-12-07 DIAGNOSIS — R531 Weakness: Secondary | ICD-10-CM | POA: Diagnosis not present

## 2023-12-07 DIAGNOSIS — R5383 Other fatigue: Secondary | ICD-10-CM | POA: Diagnosis not present

## 2023-12-13 ENCOUNTER — Encounter: Attending: Cardiology

## 2023-12-13 DIAGNOSIS — I5022 Chronic systolic (congestive) heart failure: Secondary | ICD-10-CM | POA: Diagnosis not present

## 2023-12-13 DIAGNOSIS — Z95810 Presence of automatic (implantable) cardiac defibrillator: Secondary | ICD-10-CM | POA: Insufficient documentation

## 2023-12-14 DIAGNOSIS — R531 Weakness: Secondary | ICD-10-CM | POA: Diagnosis not present

## 2023-12-14 DIAGNOSIS — R5383 Other fatigue: Secondary | ICD-10-CM | POA: Diagnosis not present

## 2023-12-15 NOTE — Progress Notes (Signed)
 EPIC Encounter for ICM Monitoring  Patient Name: [name] [name] is a 78 y.o. male Date: 12/15/2023 Primary Care Physican: Orest Bio, MD Primary Cardiologist: [facility]        Electrophysiologist: Camnitz Bi-V Pacing: 93.8%         09/21/2022 Weight: 213 lbs 10/06/2022 Weight: 219 lbs  11/17/2022 Weight: 212.5 lbs 12/22/2022 Weight: 211 lbs 01/13/2023 Weight: 209 lbs 05/14/2023 Office Weight: 204 lbs 12/31/024 Weight: 207.5 lbs 07/13/2023 Weight: 212 lbs 08/04/2023 Weight: 213 lbs 10/21/2023 Weight: 200 lbs 12/15/2023 Weight: 200 lbs   Clinical Status Since 08-Nov-2023 Time in AT/AF  0.0 hr/day (0.0%)                                              Transmission results reviewed.  Sigmoidoscopy on 5/16.   Optivol thoracic impedance suggesting normal fluid levels within the last month.      Prescribed:  Torsemide  20 mg take 1 tablet(s) (20 mg total) by mouth once a day.     Labs: 12/03/2023 Creatinine 1.08, BUN 21, Potassium 4.2, Sodium 141, GFR 70 10/20/2023 Creatinine 1.25, BUN 20, Potassium 4.6, Sodium 141, GFR 59 10/04/2023 Creatinine 1.33, BUN 21, Potassium 4.2, Sodium 138, GFR 54, BNP 5,155  09/28/2023 Creatinine 1.17, BUN 23, Potassium 4.2, Sodium 140, GFR 63, BNP 5,442 A complete set of results can be found in Results Review.   Recommendations:  No changes and encouraged to call if experiencing any fluid symptoms.   Follow-up plan: ICM clinic phone appointment on 02/01/2024.   91 day device clinic remote transmission 01/31/2024.     EP/Cardiology Office Visits:  01/28/2024 with [name], NP.    Recall 09/22/2024 with Dr Lawana Pray.      Copy of ICM check sent to Dr. Lawana Pray.   3 month ICM trend: 12/13/2023.    12-14 Month ICM trend:     Almyra Jain, RN 12/15/2023 3:35 PM

## 2023-12-21 ENCOUNTER — Ambulatory Visit: Admitting: Cardiology

## 2023-12-21 NOTE — Progress Notes (Signed)
 Carelink Summary Report / Loop Recorder

## 2023-12-21 NOTE — Telephone Encounter (Signed)
-----   Message from Katlyn D West sent at 12/05/2023  9:22 PM EDT ----- Please let [name] [name] know that his hemoglobin and hematocrit are much improved and remain in normal range. His kidney function and electrolytes are normal. Good results! Continue current medications and follow up as planned.

## 2023-12-21 NOTE — Addendum Note (Signed)
 Addended by: TAWNI DRILLING D on: 12/21/2023 12:22 PM   Modules accepted: Orders

## 2023-12-21 NOTE — Telephone Encounter (Signed)
 Called patient advised of below they verbalized understanding.

## 2023-12-28 DIAGNOSIS — R531 Weakness: Secondary | ICD-10-CM | POA: Diagnosis not present

## 2023-12-28 DIAGNOSIS — R5383 Other fatigue: Secondary | ICD-10-CM | POA: Diagnosis not present

## 2024-01-04 DIAGNOSIS — E7849 Other hyperlipidemia: Secondary | ICD-10-CM | POA: Diagnosis not present

## 2024-01-04 DIAGNOSIS — N400 Enlarged prostate without lower urinary tract symptoms: Secondary | ICD-10-CM | POA: Diagnosis not present

## 2024-01-04 DIAGNOSIS — R5381 Other malaise: Secondary | ICD-10-CM | POA: Diagnosis not present

## 2024-01-04 DIAGNOSIS — R5383 Other fatigue: Secondary | ICD-10-CM | POA: Diagnosis not present

## 2024-01-04 DIAGNOSIS — Z131 Encounter for screening for diabetes mellitus: Secondary | ICD-10-CM | POA: Diagnosis not present

## 2024-01-04 DIAGNOSIS — D509 Iron deficiency anemia, unspecified: Secondary | ICD-10-CM | POA: Diagnosis not present

## 2024-01-04 DIAGNOSIS — E876 Hypokalemia: Secondary | ICD-10-CM | POA: Diagnosis not present

## 2024-01-11 DIAGNOSIS — I739 Peripheral vascular disease, unspecified: Secondary | ICD-10-CM | POA: Diagnosis not present

## 2024-01-11 DIAGNOSIS — Z1331 Encounter for screening for depression: Secondary | ICD-10-CM | POA: Diagnosis not present

## 2024-01-11 DIAGNOSIS — R634 Abnormal weight loss: Secondary | ICD-10-CM | POA: Diagnosis not present

## 2024-01-11 DIAGNOSIS — I482 Chronic atrial fibrillation, unspecified: Secondary | ICD-10-CM | POA: Diagnosis not present

## 2024-01-11 DIAGNOSIS — Z1389 Encounter for screening for other disorder: Secondary | ICD-10-CM | POA: Diagnosis not present

## 2024-01-11 DIAGNOSIS — Z6824 Body mass index (BMI) 24.0-24.9, adult: Secondary | ICD-10-CM | POA: Diagnosis not present

## 2024-01-11 DIAGNOSIS — Z9189 Other specified personal risk factors, not elsewhere classified: Secondary | ICD-10-CM | POA: Diagnosis not present

## 2024-01-11 DIAGNOSIS — G4733 Obstructive sleep apnea (adult) (pediatric): Secondary | ICD-10-CM | POA: Diagnosis not present

## 2024-01-11 DIAGNOSIS — Z0001 Encounter for general adult medical examination with abnormal findings: Secondary | ICD-10-CM | POA: Diagnosis not present

## 2024-01-12 DIAGNOSIS — R531 Weakness: Secondary | ICD-10-CM | POA: Diagnosis not present

## 2024-01-12 DIAGNOSIS — R5383 Other fatigue: Secondary | ICD-10-CM | POA: Diagnosis not present

## 2024-01-13 DIAGNOSIS — R531 Weakness: Secondary | ICD-10-CM | POA: Diagnosis not present

## 2024-01-13 DIAGNOSIS — R5383 Other fatigue: Secondary | ICD-10-CM | POA: Diagnosis not present

## 2024-01-14 DIAGNOSIS — Z8673 Personal history of transient ischemic attack (TIA), and cerebral infarction without residual deficits: Secondary | ICD-10-CM | POA: Diagnosis not present

## 2024-01-14 DIAGNOSIS — Z9622 Myringotomy tube(s) status: Secondary | ICD-10-CM | POA: Diagnosis not present

## 2024-01-14 DIAGNOSIS — H9193 Unspecified hearing loss, bilateral: Secondary | ICD-10-CM | POA: Diagnosis not present

## 2024-01-14 DIAGNOSIS — R2689 Other abnormalities of gait and mobility: Secondary | ICD-10-CM | POA: Diagnosis not present

## 2024-01-14 DIAGNOSIS — Z79899 Other long term (current) drug therapy: Secondary | ICD-10-CM | POA: Diagnosis not present

## 2024-01-14 DIAGNOSIS — F17290 Nicotine dependence, other tobacco product, uncomplicated: Secondary | ICD-10-CM | POA: Diagnosis not present

## 2024-01-14 DIAGNOSIS — H938X1 Other specified disorders of right ear: Secondary | ICD-10-CM | POA: Diagnosis not present

## 2024-01-14 DIAGNOSIS — H903 Sensorineural hearing loss, bilateral: Secondary | ICD-10-CM | POA: Diagnosis not present

## 2024-01-14 DIAGNOSIS — H8111 Benign paroxysmal vertigo, right ear: Secondary | ICD-10-CM | POA: Diagnosis not present

## 2024-01-14 DIAGNOSIS — Z0110 Encounter for examination of ears and hearing without abnormal findings: Secondary | ICD-10-CM | POA: Diagnosis not present

## 2024-01-14 DIAGNOSIS — Z950 Presence of cardiac pacemaker: Secondary | ICD-10-CM | POA: Diagnosis not present

## 2024-01-14 DIAGNOSIS — Z951 Presence of aortocoronary bypass graft: Secondary | ICD-10-CM | POA: Diagnosis not present

## 2024-01-14 DIAGNOSIS — Z87828 Personal history of other (healed) physical injury and trauma: Secondary | ICD-10-CM | POA: Diagnosis not present

## 2024-01-14 DIAGNOSIS — Z7901 Long term (current) use of anticoagulants: Secondary | ICD-10-CM | POA: Diagnosis not present

## 2024-01-19 DIAGNOSIS — R5383 Other fatigue: Secondary | ICD-10-CM | POA: Diagnosis not present

## 2024-01-19 DIAGNOSIS — R531 Weakness: Secondary | ICD-10-CM | POA: Diagnosis not present

## 2024-01-20 DIAGNOSIS — R531 Weakness: Secondary | ICD-10-CM | POA: Diagnosis not present

## 2024-01-20 DIAGNOSIS — R5383 Other fatigue: Secondary | ICD-10-CM | POA: Diagnosis not present

## 2024-01-25 ENCOUNTER — Ambulatory Visit: Admitting: Cardiology

## 2024-01-28 ENCOUNTER — Ambulatory Visit: Admitting: Cardiology

## 2024-01-31 ENCOUNTER — Ambulatory Visit: Payer: Medicare Other

## 2024-01-31 DIAGNOSIS — R5383 Other fatigue: Secondary | ICD-10-CM | POA: Diagnosis not present

## 2024-01-31 DIAGNOSIS — R531 Weakness: Secondary | ICD-10-CM | POA: Diagnosis not present

## 2024-01-31 DIAGNOSIS — I5022 Chronic systolic (congestive) heart failure: Secondary | ICD-10-CM | POA: Diagnosis not present

## 2024-02-01 ENCOUNTER — Encounter: Attending: Cardiology

## 2024-02-01 DIAGNOSIS — I5022 Chronic systolic (congestive) heart failure: Secondary | ICD-10-CM | POA: Diagnosis not present

## 2024-02-01 DIAGNOSIS — Z95810 Presence of automatic (implantable) cardiac defibrillator: Secondary | ICD-10-CM | POA: Diagnosis not present

## 2024-02-02 ENCOUNTER — Ambulatory Visit: Payer: Self-pay | Admitting: Cardiology

## 2024-02-02 DIAGNOSIS — R5383 Other fatigue: Secondary | ICD-10-CM | POA: Diagnosis not present

## 2024-02-02 DIAGNOSIS — R531 Weakness: Secondary | ICD-10-CM | POA: Diagnosis not present

## 2024-02-02 LAB — CUP PACEART REMOTE DEVICE CHECK
Battery Remaining Longevity: 80 mo
Battery Voltage: 3 V
Brady Statistic AP VP Percent: 18.66 %
Brady Statistic AP VS Percent: 0.05 %
Brady Statistic AS VP Percent: 81.07 %
Brady Statistic AS VS Percent: 0.21 %
Brady Statistic RA Percent Paced: 18.13 %
Brady Statistic RV Percent Paced: 96.75 %
Date Time Interrogation Session: 20250804012202
HighPow Impedance: 58 Ohm
Implantable Lead Connection Status: 753985
Implantable Lead Connection Status: 753985
Implantable Lead Connection Status: 753985
Implantable Lead Implant Date: 20231102
Implantable Lead Implant Date: 20231102
Implantable Lead Implant Date: 20231102
Implantable Lead Location: 753858
Implantable Lead Location: 753859
Implantable Lead Location: 753860
Implantable Lead Model: 4598
Implantable Lead Model: 5076
Implantable Lead Model: 6935
Implantable Pulse Generator Implant Date: 20231102
Lead Channel Impedance Value: 128.074
Lead Channel Impedance Value: 128.074
Lead Channel Impedance Value: 133 Ohm
Lead Channel Impedance Value: 136.276
Lead Channel Impedance Value: 141.867
Lead Channel Impedance Value: 247 Ohm
Lead Channel Impedance Value: 247 Ohm
Lead Channel Impedance Value: 266 Ohm
Lead Channel Impedance Value: 266 Ohm
Lead Channel Impedance Value: 304 Ohm
Lead Channel Impedance Value: 342 Ohm
Lead Channel Impedance Value: 380 Ohm
Lead Channel Impedance Value: 437 Ohm
Lead Channel Impedance Value: 437 Ohm
Lead Channel Impedance Value: 437 Ohm
Lead Channel Impedance Value: 456 Ohm
Lead Channel Impedance Value: 494 Ohm
Lead Channel Impedance Value: 494 Ohm
Lead Channel Pacing Threshold Amplitude: 0.5 V
Lead Channel Pacing Threshold Amplitude: 0.75 V
Lead Channel Pacing Threshold Amplitude: 1 V
Lead Channel Pacing Threshold Pulse Width: 0.4 ms
Lead Channel Pacing Threshold Pulse Width: 0.4 ms
Lead Channel Pacing Threshold Pulse Width: 0.4 ms
Lead Channel Sensing Intrinsic Amplitude: 1.75 mV
Lead Channel Sensing Intrinsic Amplitude: 1.75 mV
Lead Channel Sensing Intrinsic Amplitude: 6.875 mV
Lead Channel Sensing Intrinsic Amplitude: 6.875 mV
Lead Channel Setting Pacing Amplitude: 1.5 V
Lead Channel Setting Pacing Amplitude: 2 V
Lead Channel Setting Pacing Amplitude: 2 V
Lead Channel Setting Pacing Pulse Width: 0.4 ms
Lead Channel Setting Pacing Pulse Width: 0.4 ms
Lead Channel Setting Sensing Sensitivity: 0.3 mV
Zone Setting Status: 755011

## 2024-02-04 ENCOUNTER — Ambulatory Visit: Attending: Cardiology | Admitting: Cardiology

## 2024-02-04 ENCOUNTER — Encounter: Payer: Self-pay | Admitting: Cardiology

## 2024-02-04 VITALS — BP 112/46 | HR 72 | Ht 73.0 in | Wt 194.2 lb

## 2024-02-04 DIAGNOSIS — I10 Essential (primary) hypertension: Secondary | ICD-10-CM | POA: Insufficient documentation

## 2024-02-04 DIAGNOSIS — G4733 Obstructive sleep apnea (adult) (pediatric): Secondary | ICD-10-CM | POA: Diagnosis not present

## 2024-02-04 NOTE — Progress Notes (Signed)
 EPIC Encounter for ICM Monitoring  Patient Name: [name] [name] is a 78 y.o. male Date: 02/04/2024 Primary Care Physican: [name] [name] ORN, MD Primary Cardiologist: [name] [name]        Electrophysiologist: [name] [name] Bi-V Pacing: 96.8%         09/21/2022 Weight: 213 lbs 10/06/2022 Weight: 219 lbs  11/17/2022 Weight: 212.5 lbs 12/22/2022 Weight: 211 lbs 01/13/2023 Weight: 209 lbs 05/14/2023 Office Weight: 204 lbs 12/31/024 Weight: 207.5 lbs 07/13/2023 Weight: 212 lbs 08/04/2023 Weight: 213 lbs 10/21/2023 Weight: 200 lbs 12/15/2023 Weight: 200 lbs 8/8//2025 Weight: 192 lbs   Clinical Status Since 13-Dec-2023 Time in AT/AF  0.0 hr/day (0.0%)                                              Spoke with patient and heart failure questions reviewed.  Transmission results reviewed.  Pt asymptomatic for fluid accumulation.  Reports feeling well at this time and voices no complaints.     Optivol thoracic impedance suggesting normal fluid levels within the last month.      Prescribed:  Torsemide  20 mg take 1 tablet(s) (20 mg total) by mouth once a day.    Per 8/4 mychart message, [name] recommended patient take Torsemide  20 mg every other day due to weight loss and concern he maybe getting too dry.    Labs: 12/03/2023 Creatinine 1.08, BUN 21, Potassium 4.2, Sodium 141, GFR 70 10/20/2023 Creatinine 1.25, BUN 20, Potassium 4.6, Sodium 141, GFR 59 10/04/2023 Creatinine 1.33, BUN 21, Potassium 4.2, Sodium 138, GFR 54, BNP 5,155  09/28/2023 Creatinine 1.17, BUN 23, Potassium 4.2, Sodium 140, GFR 63, BNP 5,442 A complete set of results can be found in Results Review.   Recommendations:  Patient started Torsemide  20 mg every other day on 8/6 and has follow up appt with [name] [name] on 8/19.  Will recheck fluid levels 8/18 and send to [name] for review at OV.     Follow-up plan: ICM clinic phone appointment on 02/14/2024 recheck fluid levels.   91 day device clinic remote transmission 05/01/2024.     EP/Cardiology Office  Visits:  02/15/2024 with [name] West, NP.    Recall 09/22/2024 with Dr Inocencio.      Copy of ICM check sent to Dr. Inocencio and [name] West NP as [name].    3 month ICM trend: 01/31/2024.    12-14 Month ICM trend:     [name] GORMAN Garner, RN 02/04/2024 3:56 PM

## 2024-02-04 NOTE — Patient Instructions (Signed)

## 2024-02-04 NOTE — Progress Notes (Signed)
 Sleep Medicine CONSULT Note    Date:  02/04/2024   ID:  YADIEL AUBRY, DOB [birth year], MRN [id number]  ID:  DRYDEN TAPLEY, DOB Jan 25, 1946, MRN [id number] PCP:  Atilano Deward ORN, MD  Cardiologist:  Vinie JAYSON Maxcy, MD  Sleep Medicine:  Wilbert Bihari, MD Electrophysiologist:  Soyla Gladis Norton, MD   Chief Complaint  Patient presents with   Sleep Apnea   Hypertension    History of Present Illness:  [name] [name] is a 78 y.o. male who is being seen today for the evaluation of OSA at the request of Vinie Maxcy, MD.  History of Present Illness:    [name] [name] is a 78 y.o. male  with a history of CAD, DM and CHF who also has OSA and has been on BiPAP with issues with noncompliance in the past.  He was referred by Dr. Maxcy due to complaints of significant fatigue and has had a hard time sleeping at night.  His sleep study showed severe OSA with an AHI of 60/hr with a mild component of central sleep apnea and oxygen  desaturations as low as 82%.  He underwent unsuccessful CPAP titration due to continued respiratory events and underwent BIPAP titration to 13/9cm H2O.  He was lost to followup and has not seen me in over 3 years.  He is not referred back for Sleep medicine consult for treatment of OSA.  He is doing well with his PAP device and thinks that he  has gotten used to it. He got his last device in 2021.   He tolerates the full face mask and feels the pressure is adequate although sometimes he feels bloated in the am.  Since going on PAP he feels rested in the am and has no significant daytime sleepiness.  He typically will nap in the afternoon.  He has had problems with mouth dryness and has tried to increase the humidity.  He has been running out of water  in his water  chamber. He denies any significant  nasal dryness or nasal congestion.  He does not think that he snores.  Patient denies any episodes of bruxism, restless legs, No gagging hallucinations or cataplectic events.      Prior CV studies:   The following studies were reviewed today:  Prior OV notes  Past Medical History:  Diagnosis Date   AICD (automatic cardioverter/defibrillator) present    Arthritis    Knee L - probably   Atrial fibrillation (HCC)    BPH (benign prostatic hyperplasia)    CAD (coronary artery disease) 07/06/2011   CHF (congestive heart failure) (HCC)    Coronary artery disease    Diabetes mellitus    Frequency of urination    Heart failure (HCC)    High cholesterol    History of nuclear stress test 09/2010   dipyridamole; mild perfusion defect due to attenuation with mild superimposed ischemia in apical septal, apical, apical inferior & apical lateral regions; rest LV enlarged in size; prominent gut uptake in infero-apical region; no significant ischemia demonstrated; low risk scan    HNP (herniated nucleus pulposus), lumbar    Hypertension    Ischemic cardiomyopathy    LV dysfunction 07/06/2011   Nocturia    Right bundle branch block    S/P CABG (coronary artery bypass graft) 08/03/2011   x3; LIMA to LAD,, SVG to PDA, SVG to posterolateral branch of RCA; Dr. WENDI Fellers   Sleep apnea    sleep study 10/2010- AHI during  total sleep 32.1/hr and during REM 62.3/hr (severe sleep apnea)unable to tolerate c pap   Spinal stenosis of lumbar region    Stroke [facility])    [place]    Past Surgical History:  Procedure Laterality Date   ABDOMINAL AORTOGRAM W/LOWER EXTREMITY Left 11/14/2020   Procedure: ABDOMINAL AORTOGRAM W/LOWER EXTREMITY;  Surgeon: Gretta Lonni PARAS, MD;  Location: [facility] INVASIVE CV LAB;  Service: Cardiovascular;  Laterality: Left;   ABDOMINAL AORTOGRAM W/LOWER EXTREMITY N/A 01/02/2021   Procedure: ABDOMINAL AORTOGRAM W/LOWER EXTREMITY;  Surgeon: Gretta Lonni PARAS, MD;  Location: [facility] INVASIVE CV LAB;  Service: Cardiovascular;  Laterality: N/A;   AMPUTATION Left 03/20/2021   Procedure: Left foot 5th ray amputation;  Surgeon: Kit Rush, MD;  Location: [facility] OR;   Service: Orthopedics;  Laterality: Left;   ATRIAL FIBRILLATION ABLATION N/A 01/19/2023   Procedure: ATRIAL FIBRILLATION ABLATION;  Surgeon: Inocencio Soyla Lunger, MD;  Location: [facility] INVASIVE CV LAB;  Service: Cardiovascular;  Laterality: N/A;   BIOPSY  04/14/2023   Procedure: BIOPSY;  Surgeon: Shaaron Lamar HERO, MD;  Location: [facility] ENDO SU[facility];  Service: Endoscopy;;   BIV ICD INSERTION CRT-D N/A 04/30/2022   Procedure: BIV ICD INSERTION CRT-D;  Surgeon: Inocencio Soyla Lunger, MD;  Location: [facility] INVASIVE CV LAB;  Service: Cardiovascular;  Laterality: N/A;   CARDIAC CATHETERIZATION  01/2011   ischemic cardiomyopathy 30-35%, multivessel CAD (Dr. IVAR Sor)    CARDIAC CATHETERIZATION  09/13/2015   Procedure: Right/Left Heart Cath and Coronary/Graft Angiography;  Surgeon: Vinie JAYSON Maxcy, MD;  Location: [facility] INVASIVE CV LAB;  Service: Cardiovascular;;   CARDIOVERSION N/A 05/16/2019   Procedure: CARDIOVERSION;  Surgeon: Maxcy Vinie JAYSON, MD;  Location: [facility] ENDOSCOPY;  Service: Cardiovascular;  Laterality: N/A;   CARDIOVERSION N/A 10/16/2022   Procedure: CARDIOVERSION;  Surgeon: Francyne Headland, MD;  Location: [facility] INVASIVE CV LAB;  Service: Cardiovascular;  Laterality: N/A;   CARDIOVERSION N/A 12/16/2022   Procedure: CARDIOVERSION;  Surgeon: Kate Lonni CROME, MD;  Location: [facility] INVASIVE CV LAB;  Service: Cardiovascular;  Laterality: N/A;   COLONOSCOPY WITH PROPOFOL  N/A 04/14/2023   Procedure: COLONOSCOPY WITH PROPOFOL ;  Surgeon: Shaaron Lamar HERO, MD;  Location: [facility];  Service: Endoscopy;  Laterality: N/A;  11:00am, asa 3/4   COLONOSCOPY WITH PROPOFOL  N/A 04/18/2023   Procedure: COLONOSCOPY WITH PROPOFOL ;  Surgeon: Eartha Angelia Sieving, MD;  Location: [facility];  Service: Gastroenterology;  Laterality: N/A;   Colonscopy     CORONARY ARTERY BYPASS GRAFT  08/03/2011   Procedure: CORONARY ARTERY BYPASS GRAFTING (CABG);  Surgeon: Dorise MARLA Fellers, MD;  Location: [facility] OR;  Service: Open Heart Surgery;   Laterality: N/A;  CABG times three using right saphenous vein and left mammary artery usisng endoscope.   ESOPHAGEAL BRUSHING  04/14/2023   Procedure: ESOPHAGEAL BRUSHING;  Surgeon: Shaaron Lamar HERO, MD;  Location: [facility];  Service: Endoscopy;;   ESOPHAGOGASTRODUODENOSCOPY (EGD) WITH PROPOFOL  N/A 04/14/2023   Procedure: ESOPHAGOGASTRODUODENOSCOPY (EGD) WITH PROPOFOL ;  Surgeon: Shaaron Lamar HERO, MD;  Location: [facility];  Service: Endoscopy;  Laterality: N/A;   FLEXIBLE SIGMOIDOSCOPY N/A 11/12/2023   Procedure: KINGSTON SIDE;  Surgeon: Shaaron Lamar HERO, MD;  Location: [facility];  Service: Endoscopy;  Laterality: N/A;  230pm, asa 3, has defibrillator   HEMOSTASIS CLIP PLACEMENT  04/14/2023   Procedure: HEMOSTASIS CLIP PLACEMENT;  Surgeon: Shaaron Lamar HERO, MD;  Location: [facility];  Service: Endoscopy;;   LEFT HEART CATH AND CORS/GRAFTS ANGIOGRAPHY N/A 12/15/2021   Procedure: LEFT HEART CATH AND  CORS/GRAFTS ANGIOGRAPHY;  Surgeon: Wonda Sharper, MD;  Location: [facility] INVASIVE CV LAB;  Service: Cardiovascular;  Laterality: N/A;   LEFT HEART CATHETERIZATION WITH CORONARY/GRAFT ANGIOGRAM N/A 09/20/2013   Procedure: LEFT HEART CATHETERIZATION WITH EL BILE;  Surgeon: Vinie KYM Maxcy, MD;  Location: [facility] CATH LAB;  Service: Cardiovascular;  Laterality: N/A;   Lower Extremity Arterial Doppler  03/16/2013   bilat ABIs demonstated normal values; R runoff - posterior tibial & anterior tibial arteries occluded; L runoff - peroneal & posterior tibial arteries occluded, anterior tibial artery appears occluded   LUMBAR LAMINECTOMY/DECOMPRESSION MICRODISCECTOMY N/A 09/12/2014   Procedure: LUMBAR DECOMPRESSION L3-L4, L4-L5, MICRODISCECTOMY L4-L5;  Surgeon: Reyes Billing, MD;  Location: [facility] ORS;  Service: Orthopedics;  Laterality: N/A;   PERIPHERAL VASCULAR BALLOON ANGIOPLASTY  11/14/2020   Procedure: PERIPHERAL VASCULAR BALLOON ANGIOPLASTY;  Surgeon: Gretta Lonni PARAS, MD;   Location: [facility] INVASIVE CV LAB;  Service: Cardiovascular;;   PERIPHERAL VASCULAR BALLOON ANGIOPLASTY Left 01/02/2021   Procedure: PERIPHERAL VASCULAR BALLOON ANGIOPLASTY;  Surgeon: Gretta Lonni PARAS, MD;  Location: [facility] INVASIVE CV LAB;  Service: Cardiovascular;  Laterality: Left;  Failed PTA   Pilonidal Cyst removed     POLYPECTOMY  04/14/2023   Procedure: POLYPECTOMY INTESTINAL;  Surgeon: Shaaron Lamar HERO, MD;  Location[facility];  Service: Endoscopy;;   SCLEROTHERAPY  04/14/2023   Procedure: MATIAS;  Surgeon: Shaaron Lamar HERO, MD;  [facility];  Service: Endoscopy;;   SUBMUCOSAL LIFTING INJECTION  04/14/2023   Procedure: SUBMUCOSAL LIFTING INJECTION;  Surgeon: Shaaron Lamar HERO, MD;  [facility];  Service: Endoscopy;;   SUBMUCOSAL TATTOO INJECTION  04/14/2023   Procedure: SUBMUCOSAL TATTOO INJECTION;  Surgeon: Shaaron Lamar HERO, MD;  Location: [facility];  Service: Endoscopy;;   TRANSTHORACIC ECHOCARDIOGRAM  11/10/2012   EF 40-45%, mild LVH, mild hypokinesis of anteroseptal myocardium, grade 1 diastolic dysfunction; mild MR & calcifed mitral annulus; LA mild-mod dilated; RA mildly dilated     Current Meds  Medication Sig   acetaminophen  (TYLENOL ) 500 MG tablet Take 500-1,000 mg by mouth every 6 (six) hours as needed (for pain.).   apixaban  (ELIQUIS ) 5 MG TABS tablet Take 1 tablet (5 mg total) by mouth 2 (two) times daily.   carvedilol  (COREG ) 3.125 MG tablet Take 1 tablet by mouth twice daily   empagliflozin  (JARDIANCE ) 10 MG TABS tablet Take 1 tablet (10 mg total) by mouth daily before breakfast.   gabapentin  (NEURONTIN ) 300 MG capsule Take 600 mg by mouth at bedtime.   glipiZIDE  (GLUCOTROL  XL) 5 MG 24 hr tablet Take 5 mg by mouth daily.   metFORMIN  (GLUCOPHAGE -XR) 500 MG 24 hr tablet Take 1,000 mg by mouth 2 (two) times daily.   ONETOUCH VERIO test strip 1 each daily.   rosuvastatin  (CRESTOR ) 10 MG tablet Take 1 tablet (10 mg total) by mouth every other  day.   torsemide  (DEMADEX ) 20 MG tablet Take 1 tablet (20 mg total) by mouth daily. (Patient taking differently: Take 20 mg by mouth daily. Taking every other day)     Allergies:   Entresto  [sacubitril -valsartan ] and Sacubitril    Social History   Tobacco Use   Smoking status: Former    Types: Cigars    Quit date: 09/07/2011    Years since quitting: 12.4   Smokeless tobacco: Former    Quit date: 02/28/2012   Tobacco comments:    Former smoker 02/16/23  Vaping Use   Vaping status: Never Used  Substance Use Topics   Alcohol  use:  Not Currently    Alcohol /week: 1.0 - 2.0 standard drink of alcohol     Types: 1 - 2 Cans of beer per week   Drug use: No     Family Hx: The patient's family history includes Anesthesia problems in his daughter; Cancer in his mother; Colon cancer in his sister; Colon polyps in his father; Lung disease in his father; Sudden death in his maternal grandmother.  ROS:   Please see the history of present illness.     All other systems reviewed and are negative.   Labs/Other Tests and Data Reviewed:    Recent Labs: 04/17/2023: ALT 22 04/27/2023: TSH 2.190 06/21/2023: BNP 428.5 10/04/2023: NT-Pro BNP 5,155 12/03/2023: BUN 21; Creatinine, Ser 1.08; Hemoglobin 13.0; Platelets 147; Potassium 4.2; Sodium 141   Recent Lipid Panel Lab Results  Component Value Date/Time   CHOL 98 (L) 12/10/2021 08:32 AM   TRIG 88 12/10/2021 08:32 AM   HDL 35 (L) 12/10/2021 08:32 AM   CHOLHDL 2.8 12/10/2021 08:32 AM   LDLCALC 46 12/10/2021 08:32 AM    Wt Readings from Last 3 Encounters:  02/04/24 194 lb 3.2 oz (88.1 kg)  12/03/23 207 lb 9.6 oz (94.2 kg)  11/12/23 203 lb 11.3 oz (92.4 kg)     Objective:    Vital Signs:  BP (!) 112/46   Pulse 72   Ht 6' 1 (1.854 m)   Wt 194 lb 3.2 oz (88.1 kg)   SpO2 98%   BMI 25.62 kg/m   GEN: Well nourished, well developed in no acute distress HEENT: Normal NECK: No JVD; No carotid bruits LYMPHATICS: No  lymphadenopathy CARDIAC:RRR, no murmurs, rubs, gallops RESPIRATORY:  Clear to auscultation without rales, wheezing or rhonchi  ABDOMEN: Soft, non-tender, non-distended MUSCULOSKELETAL:  No edema; No deformity  SKIN: Warm and dry NEUROLOGIC:  Alert and oriented x 3 PSYCHIATRIC:  Normal affect   ASSESSMENT & PLAN:    OSA - The patient is tolerating PAP therapy well without any problems. The PAP download performed by his DME was personally reviewed and interpreted by me today and showed an AHI of 3.1 /hr on BiPAP at 13/9 cm H2O with 93% compliance in using more than 4 hours nightly.  The patient has been using and benefiting from PAP use and will continue to benefit from therapy. -encouraged him to place a humidifier in his bedroom so he does not run out of water  at night -he complains of sometimes feeling bloated in the am so will decrease his pressure to 12/8cm H2O   -check download in 4 weeks -he has problems with staying asleep during the night so I told him he could try low dose melatonin  HTN - BP controlled on exam today - Continue carvedilol  3.125 mg twice daily with as needed refills  Medication Adjustments/Labs and Tests Ordered: Current medicines are reviewed at length with the patient today.  Concerns regarding medicines are outlined above.  Tests Ordered: No orders of the defined types were placed in this encounter.  Medication Changes: No orders of the defined types were placed in this encounter.   Disposition:  Follow up with me in 1 year  Signed, Wilbert Bihari, MD  02/04/2024 11:42 AM    [facility]

## 2024-02-08 DIAGNOSIS — R5383 Other fatigue: Secondary | ICD-10-CM | POA: Diagnosis not present

## 2024-02-08 DIAGNOSIS — R531 Weakness: Secondary | ICD-10-CM | POA: Diagnosis not present

## 2024-02-14 ENCOUNTER — Ambulatory Visit (INDEPENDENT_AMBULATORY_CARE_PROVIDER_SITE_OTHER)

## 2024-02-14 ENCOUNTER — Telehealth: Payer: Self-pay | Admitting: *Deleted

## 2024-02-14 DIAGNOSIS — I5022 Chronic systolic (congestive) heart failure: Secondary | ICD-10-CM

## 2024-02-14 DIAGNOSIS — G4733 Obstructive sleep apnea (adult) (pediatric): Secondary | ICD-10-CM

## 2024-02-14 DIAGNOSIS — Z95810 Presence of automatic (implantable) cardiac defibrillator: Secondary | ICD-10-CM

## 2024-02-14 NOTE — Telephone Encounter (Signed)
-----   Message from Wilbert Bihari sent at 02/04/2024 11:50 AM EDT ----- decreass his pressure to BiPAP at 12/8cm H2O   -check download in 4 weeks

## 2024-02-14 NOTE — Telephone Encounter (Signed)
 Per Dr Shlomo, order placed to [facility] decrease his pressure to BiPAP at 12/8cm H2O    -check download in 4 weeks   Upon patient request DME selection is [facility].

## 2024-02-14 NOTE — Progress Notes (Unsigned)
 Cardiology Office Note    Date:  02/15/2024  ID:  GEOFFREY HYNES, DOB September 04, 1945, MRN [id number] PCP:  Atilano Deward ORN, MD  Cardiologist:  Vinie JAYSON Maxcy, MD  Electrophysiologist:  Soyla Gladis Norton, MD   Chief Complaint: Follow up for HFrEF   History of Present Illness: .    [name] [name] is a 78 y.o. male with visit-pertinent history of  CAD s/p CABG in 2013 with ischemic cardiomyopathy-EF 20-25% prior to surgery, improved to 40-45% by 10/2012, now 30-35%, mild MR, PAD, DM and PAF on chronic anticoagulation s/p ablation. Reduction in EF led to ICD insertion in 2023.      Heart catheterization in 08/2013 showed patent LIMA to LAD, SVG-PLA, SVG-PDA.  Most recent heart catheterization in 11/2021 showed patent LIMA to LAD but occlusion of both SVG grafts.  Given wall motion abnormality on echocardiogram, viability study was recommended to help make decisions regarding complex atherectomy and intervention of the left circumflex that would involve stenting back of the left main and covering the long segment throughout the circumflex to treat multiple severe calcific lesions.  Cardiac MRI was pursued and showed EF 20% and severe thinning in the apical to basal lateral wall and apical inferior wall suggesting nonviability in these regions.  Therefore complex intervention was not pursued.  He was treated medically.  Given persistent reduction in EF he underwent BiV ICD insertion CRT-D on 04/30/2022.   In March 2024 he reported worsening shortness of breath.  Device interrogation showed possible fluid accumulation in atrial fibrillation.  Diuretic was increased and EKG showed underlying A-fib with ventricularly paced rhythm and PVCs.  He presented for scheduled outpatient cardioversion on 10/16/2022 and was successfully cardioverted to a paced BiV rhythm.  He was seen by Dr. Norton on 10/27/2022 and was back in afib, he was started on amiodarone .   Cardiology was consulted in 10/2022 while patient was  admitted in setting of mechanical fall resulting in subdural hematoma 4 mm without any mass effect.  After discharge he fell again, he resumed Eliquis  on 11/24/22. On 12/23/2022 he presented for DCCV which was successful.  On 01/19/23 he underwent afib ablation with Dr. Norton, on follow up with afib clinic on 8/20 he reported no further afib. He was last seen in clinic on 02/26/23 by Dr. Maxcy, it was noted that he had lost 50 lbs over the past year, he had an abnormal cologuard test with his PCP.      On 04/17/23 patient underwent colonoscopy, was found to have multiple scattered diverticula as well as 3 polyps that were clipped with cold snare's.  Following he had normal bowel movements until 2 days after colonoscopy when he noticed bright red blood from his rectum during bowel movements.  He presented to the ED on 04/19/2023, blood pressures were soft and stools positive for blood.  His hemoglobin at baseline is around 11, decreased to 8.3.  He received 1 unit of packed red blood cells, hemoglobin proved to 9.4.  He underwent repeat colonoscopy while inpatient showing a single post polypectomy ulcer in the cecum.  He had multiple clips placed at previous polypectomy.  While inpatient he was found to be orthostatic, he was given a 500 mL fluid bolus, his Lasix  was held while inpatient.  He was started on midodrine  5 mg 3 times daily.  His Coreg  was discontinued, he was instructed to resume Eliquis  on 04/26/2023.   On 04/22/2023 he had a device check which showed 0% A-fib  burden, OptiVol thoracic impedance suggested possible fluid accumulation starting 10/3 and becoming worse during and following hospital discharge on 10/23.  Following discharge he was restarted on his furosemide  40 mg daily with ICM clinic phone appointment on 05/03/2023 to recheck fluid levels.   He was seen in clinic on 04/27/23. He felt he was improving, noted that he felt significantly better than he did the week prior.  He reported that he  had stopped midodrine  as he was noting intermittent headaches and difficulty with urination.  With lower extremity edema noted on exam and thoracic impedance noted possible fluid accumulation his Lasix  was increased to 40 mg in the morning and afternoon for 4 days. Patient had noted improvement in fluid status on follow up however, on 06/07/2023 he notified the office of feeling increasingly short of breath with minimal exertion, tired with small amount of leg swelling and a 3 to 4 pound weight gain over the last couple of weeks.  His weight was 209 pounds, prior weight at office visit 204 pounds.  Patient reported he been taking furosemide  40 mg twice a day for 4 days with no relief of symptoms.  OptiVol suggested possible fluid accumulation since 11/15.  Patient completed Furoscix  for one dose and reported improvement.   Patient was last in clinic on 09/03/2023 by Dr. Mona.  Patient had recently returned from [place] , was planning to undergo sigmoidoscopy with Dr. Shaaron.  Patient reported he had some shortness of breath and still gets weak and fatigued with exertion.  Patient reported that his shortness of breath had not been improving in the prior few days.  Patient was advised that he could proceed with sigmoidoscopy acceptable risk.  On 09/21/2023 patient notified the office that his procedure had been canceled, he noted that his shortness of breath had worsened and was seen by his PCP who noted that he had a good amount of fluid in 1 lung and some of the other.  Patient's morning Lasix  was doubled and he was taking an additional pill in the afternoon.  Patient was seen in clinic on 09/29/2023, he reported increased shortness of breath, he had increased his Lasix  with some improvement in symptoms.  However he continued to endorse dyspnea with exertion and increased lower extremity edema and some mild orthopnea.  Patient was started on Furoscix  for 3 doses.  Patient's ICM monitoring on/12/2023 suggested normal  fluid levels since 3/26 with exception of possible fluid accumulation from 3/18 through 3/25.  Patient was seen in clinic on/03/2024.  He reported he was feeling significantly better.  Patient was transitioned to torsemide  and instructed to discontinue Lasix .  On May 16 patient underwent repeat colonoscopy for monitoring, to have another in 1 year.  Patient reports that his torsemide  was held a few days prior to procedure and he did have a slight weight increase as well as increased shortness of breath.  His torsemide  was increased to 40 mg, patient reports he did this for 2 days with quick resolution in symptoms.  Patient was last seen in clinic on 12/03/2023, he reported he been doing well however noted he simply did not feel good.  He noted that he did not feel bad but continued have low energy level and decreased appetite.  He reported that he had started attending physical therapy and was tolerating very well.  Patient's Jardiance  was reduced to 10 mg daily.  Today he presents for follow-up.  He reports that he has been doing well, has noted fluctuations in  weight at home, has difficulty telling if fluid weight or weight loss from decreased appetite. He felt his appetite did improve some with decreasing his dose of Jardiance .  Although he notes that over time food simply does not taste good.  He denies any chest pain, denies any dyspnea on exertion.  He reports that his breathing occassionally changes, as though he is breathing out of his stomach sometimes when he is at rest, his partner notes this he seems to have increased belching as of late, they are considering following up with GI.  He denies any significant lower extremity edema, orthopnea or PND.  He denies any palpitations or feeling of irregular heartbeats.  He notes that with changing his torsemide  to 20 mg every other day he has had some mild weight gain however has not felt any changes otherwise. He notes that he will be returning to [place]   again in January and February of next year.  ROS: .   Today he denies chest pain, shortness of breath, lower extremity edema, palpitations, melena, hematuria, hemoptysis, diaphoresis, weakness, presyncope, syncope, orthopnea, and PND.  All other systems are reviewed and otherwise negative. Studies Reviewed: SABRA   EKG:  EKG is not ordered today.  CV Studies: Cardiac studies reviewed are outlined and summarized above. Otherwise please see EMR for full report. Cardiac Studies & Procedures   ______________________________________________________________________________________________ CARDIAC CATHETERIZATION  CARDIAC CATHETERIZATION 12/15/2021  Conclusion 1.  Severe native coronary artery disease with patency of the left main, total occlusion of the LAD just after the first diagonal, multiple severe calcific stenoses throughout the entirety of the circumflex distribution, and moderate stenoses in the RCA 2.  Status post CABG with continued patency of the LIMA to LAD, and interval occlusion of the SVG to PDA and SVG to PLA 3.  Normal LVEDP  Heart team review, consider medical therapy versus complex atherectomy and intervention of the left circumflex which would involve stenting back into the left main and covering a long segment throughout the circumflex in order to treat multiple severe calcific lesions.  Note that the patient had akinesis of the inferior and posterior walls on his echo and hypokinesis of the anterior wall segments.  He does not have angina.  Might be appropriate to perform a viability study.  Will discuss his case with Dr. Mona.  Findings Coronary Findings Diagnostic  Dominance: Right  Left Main Ost LM to LM lesion is 30% stenosed. The lesion is discrete. The lesion is moderately calcified.  Left Anterior Descending Vessel is small. Ost LAD to Prox LAD lesion is 50% stenosed. Mid LAD lesion is 100% stenosed. The lesion is severely calcified.  Left Circumflex Vessel is  small. The entire circumflex vessel is severely calcified.  There is high-grade 95% stenosis at the ostium, severe eccentric 95% stenosis in the proximal vessel, severe 90% stenosis in the mid vessel, and severe 90% stenosis in the distal vessel just before it branches into 2 OM's Ost Cx lesion is 95% stenosed. The lesion is calcified. Prox Cx to Mid Cx lesion is 90% stenosed. The lesion is eccentric. The lesion is severely calcified. Mid Cx to Dist Cx lesion is 90% stenosed. The lesion is discrete.  Right Coronary Artery Prox RCA lesion is 60% stenosed. The lesion is type C and discrete. The lesion is severely calcified. Dist RCA lesion is 50% stenosed.  Right Posterior Descending Artery Vessel is small in size.  Right Posterior Atrioventricular Artery Vessel is small in size. RPAV lesion is 100%  stenosed.  Graft To RPDA And is moderate in size.  The graft exhibits minimal luminal irregularities. Origin to Prox Graft lesion is 100% stenosed.  Graft To RPAV And is small.  The graft exhibits no disease. Origin to Prox Graft lesion is 100% stenosed.  LIMA LIMA Graft To Dist LAD LIMA and is moderate in size.  The graft exhibits no disease. There is competitive flow. The LIMA to LAD graft is widely patent with no stenosis.  This fills the entirety of the LAD as it is occluded just beyond the first diagonal branch  Intervention  No interventions have been documented.   CARDIAC CATHETERIZATION  CARDIAC CATHETERIZATION 09/13/2015  Conclusion  SVG was injected is moderate in size. numerous valves  The graft exhibits minimal luminal irregularities.  SVG was injected is small, and is anatomically normal.  Prox RCA lesion, 85% stenosed.  Prox LAD lesion, 100% stenosed.  Pedicle LIMA was injected is moderate in size, and is anatomically normal.  There is competitive flow.  Mid Cx to Dist Cx lesion, 30% stenosed.  Post Atrio lesion, 100% stenosed.  Ost LM to LM lesion, 30%  stenosed.  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 Patent LIMA-LAD, SVG-PDA and SVG-PLB grafts with TIMI III flow and good distal runoff. LVEDP = 16 mmHg. Moderately reduced CO by Fick of 4.8 L/min and CI of 2 L/min, RA of 6 and PA mean of 29 mmHg. Compensated right and left heart pressures. LVEF 30-35% with global hypokinesis and inferior akinesis.  Vinie KYM Maxcy, MD, [facility] Attending Cardiologist [facility]  Findings Coronary Findings Diagnostic  Dominance: Right  Left Main Moderately Calcified discrete.  Left Anterior Descending . Vessel is small. Severely Calcified.  Left Circumflex . Vessel is small. Discrete.  Right Coronary Artery The lesion is type C Discrete.  Right Posterior Descending Artery The vessel is small in size.  Right Posterior Atrioventricular Artery The vessel is small in size.  Single Graft Graft To RPDA SVG was injected is moderate in size. numerous valves  The graft exhibits minimal luminal irregularities.  Single Graft Graft To RPAV SVG was injected is small, and is anatomically normal.  LIMA LIMA Graft To Dist LAD LIMA was injected is moderate in size, and is anatomically normal. There is competitive flow.  Intervention  No interventions have been documented.   STRESS TESTS  NM MYOCAR MULTI W/SPECT W 10/20/2010   ECHOCARDIOGRAM  ECHOCARDIOGRAM COMPLETE 02/19/2023  Narrative ECHOCARDIOGRAM REPORT    Patient Name:   [name] [name] Date of Exam: 02/19/2023 Medical Rec #:  [id number]      Height:       74.0 in Accession #:    [id number]     Weight:       205.2 lb Date of Birth:  Apr 18, 1946     BSA:          2.197 m Patient Age:    5 years       BP:           110/59 mmHg Patient Gender: M              HR:           67 bpm. Exam Location:  [facility]  Procedure: 2D Echo, Cardiac Doppler and Color Doppler  Indications:    Congestive Heart Failure I50.9  History:        Patient has prior  history of Echocardiogram examinations, most recent 11/10/2022. Cardiomyopathy  and CHF, CAD, Prior CABG and Defibrillator, Stroke, Arrythmias:Atrial Fibrillation; Risk Factors:Diabetes, Hypertension and Dyslipidemia.  Sonographer:    Aida Pizza RCS Referring Phys: [phone] KENNETH C [name]  IMPRESSIONS   1. Left ventricular ejection fraction, by estimation, is 30 to 35%. The left ventricle has moderately decreased function. The left ventricle demonstrates regional wall motion abnormalities (see scoring diagram/findings for description). The left ventricular internal cavity size was moderately dilated. There is mild asymmetric left ventricular hypertrophy of the basal segment. Left ventricular diastolic parameters are consistent with Grade III diastolic dysfunction (restrictive). 2. Right ventricular systolic function is mildly reduced. The right ventricular size is normal. There is severely elevated pulmonary artery systolic pressure. The estimated right ventricular systolic pressure is 60.1 mmHg. 3. Left atrial size was mildly dilated. 4. Right atrial size was mildly dilated. 5. The mitral valve is abnormal with restricted posterior leaflet. Mild mitral valve regurgitation. 6. Tricuspid valve regurgitation is mild to moderate. 7. The aortic valve is tricuspid. Aortic valve regurgitation is not visualized. 8. The inferior vena cava is dilated in size with >50% respiratory variability, suggesting right atrial pressure of 8 mmHg.  Comparison(s): Prior images reviewed side by side. LVEF 30-35% with wall motion abnormalities consistent with ischemic cardiomyopathy. Mild mitral regurgitation. Severely elevated estimated RVSP of 60 mmHg.  FINDINGS Left Ventricle: Left ventricular ejection fraction, by estimation, is 30 to 35%. The left ventricle has moderately decreased function. The left ventricle demonstrates regional wall motion abnormalities. The left ventricular internal cavity size  was moderately dilated. There is mild asymmetric left ventricular hypertrophy of the basal segment. Left ventricular diastolic parameters are consistent with Grade III diastolic dysfunction (restrictive).   LV Wall Scoring: The antero-lateral wall and posterior wall are akinetic. The mid and distal anterior septum, inferior septum, apical lateral segment, and apex are hypokinetic. The basal anteroseptal segment is normal.  Right Ventricle: The right ventricular size is normal. No increase in right ventricular wall thickness. Right ventricular systolic function is mildly reduced. There is severely elevated pulmonary artery systolic pressure. The tricuspid regurgitant velocity is 3.61 m/s, and with an assumed right atrial pressure of 8 mmHg, the estimated right ventricular systolic pressure is 60.1 mmHg.  Left Atrium: Left atrial size was mildly dilated.  Right Atrium: Right atrial size was mildly dilated.  Pericardium: There is no evidence of pericardial effusion.  Mitral Valve: The mitral valve is abnormal. Mild mitral valve regurgitation. MV peak gradient, 7.8 mmHg. The mean mitral valve gradient is 2.0 mmHg.  Tricuspid Valve: The tricuspid valve is grossly normal. Tricuspid valve regurgitation is mild to moderate.  Aortic Valve: The aortic valve is tricuspid. There is mild aortic valve annular calcification. Aortic valve regurgitation is not visualized.  Pulmonic Valve: The pulmonic valve was grossly normal. Pulmonic valve regurgitation is trivial.  Aorta: The aortic root is normal in size and structure.  Venous: The inferior vena cava is dilated in size with greater than 50% respiratory variability, suggesting right atrial pressure of 8 mmHg.  IAS/Shunts: No atrial level shunt detected by color flow Doppler.  Additional Comments: A device lead is visualized.   LEFT VENTRICLE PLAX 2D LVIDd:         6.40 cm      Diastology LVIDs:         5.30 cm      LV e' medial:    3.06  cm/s LV PW:         0.80 cm      LV E/e' medial:  37.9 LV IVS:        1.20 cm      LV e' lateral:   5.12 cm/s LVOT diam:     2.00 cm      LV E/e' lateral: 22.7 LV SV:         57 LV SV Index:   26 LVOT Area:     3.14 cm  LV Volumes (MOD) LV vol d, MOD A2C: 163.0 ml LV vol d, MOD A4C: 167.0 ml LV vol s, MOD A2C: 101.0 ml LV vol s, MOD A4C: 111.0 ml LV SV MOD A2C:     62.0 ml LV SV MOD A4C:     167.0 ml LV SV MOD BP:      60.2 ml  RIGHT VENTRICLE RV S prime:     7.25 cm/s TAPSE (M-mode): 1.9 cm  LEFT ATRIUM              Index        RIGHT ATRIUM           Index LA diam:        4.70 cm  2.14 cm/m   RA Area:     24.60 cm LA Vol (A2C):   103.0 ml 46.88 ml/m  RA Volume:   75.10 ml  34.18 ml/m LA Vol (A4C):   56.4 ml  25.67 ml/m LA Biplane Vol: 78.1 ml  35.54 ml/m AORTIC VALVE LVOT Vmax:   75.70 cm/s LVOT Vmean:  48.500 cm/s LVOT VTI:    0.181 m  AORTA Ao Root diam: 3.30 cm  MITRAL VALVE                TRICUSPID VALVE MV Area (PHT): 4.31 cm     TR Peak grad:   52.1 mmHg MV Area VTI:   1.52 cm     TR Vmax:        361.00 cm/s MV Peak grad:  7.8 mmHg MV Mean grad:  2.0 mmHg     SHUNTS MV Vmax:       1.40 m/s     Systemic VTI:  0.18 m MV Vmean:      54.4 cm/s    Systemic Diam: 2.00 cm MV Decel Time: 176 msec MR Peak grad: 82.8 mmHg MR Mean grad: 49.0 mmHg MR Vmax:      455.00 cm/s MR Vmean:     318.0 cm/s MV E velocity: 116.00 cm/s MV A velocity: 41.10 cm/s MV E/A ratio:  2.82  Jayson Sierras MD Electronically signed by Jayson Sierras MD Signature Date/Time: 02/19/2023/10:45:43 AM    Final        CARDIAC MRI  MR CARDIAC MORPHOLOGY W WO CONTRAST 01/19/2022  Narrative CLINICAL DATA:  52M with CAD s/p CABG x3 (LIMa-LAD, SVG-PDA, SVG-PLA). Echo 12/05/21 showed EF 30-35%. Cath 12/15/21 showed patient LIMA-LAD, occluded SVG-PDA and SVG-PLA. Evaluate viability.  EXAM: CARDIAC MRI  TECHNIQUE: The patient was scanned on a 1.5 Tesla Siemens magnet. A  dedicated cardiac coil was used. Functional imaging was done using Fiesta sequences. 2,3, and 4 chamber views were done to assess for RWMA's. Modified Simpson's rule using a short axis stack was used to calculate an ejection fraction on a dedicated work [employer]. The patient received 10 cc of Gadavist . After 10 minutes inversion recovery sequences were used to assess for infiltration and scar tissue.  CONTRAST:  10 cc  of Gadavist   FINDINGS: Left ventricle:  -Severe dilatation  -Severe systolic dysfunction. Thinning/akinesis of lateral  and inferior walls. Lateral wall and apical inferior wall measures <4.85mm in thickness, suggesting nonviability.  -Elevated ECV (38%)  -No LGE  LV EF: 28% (Normal 56-78%)  Absolute volumes:  LV EDV: (Normal 77-195 mL)  LV ESV: (Normal 19-72 mL)  LV SV: 95mL (Normal 51-133 mL)  CO: 6.0L/min (Normal 2.8-8.8 L/min)  Indexed volumes:  LV EDV: 164mL/sq-m (Normal 47-92 mL/sq-m)  LV ESV: 171mL/sq-m (Normal 13-30 mL/sq-m)  LV SV: 54mL/sq-m (Normal 32-62 mL/sq-m)  CI: 2.7L/min/sq-m (Normal 1.7-4.2 L/min/sq-m)  Right ventricle: Mild dilatation with mild systolic dysfunction  RV EF:  59% (Normal 47-74%)  Absolute volumes:  RV EDV: (Normal 88-227 mL)  RV ESV: (Normal 23-103 mL)  RV SV: 96mL (Normal 52-138 mL)  CO: 6.1L/min (Normal 2.8-8.8 L/min)  Indexed volumes:  RV EDV: 159mL/sq-m (Normal 55-105 mL/sq-m)  RV ESV: 13mL/sq-m (Normal 15-43 mL/sq-m)  RV SV: 71mL/sq-m (Normal 32-64 mL/sq-m)  CI: 2.7L/min/sq-m (Normal 1.7-4.2 L/min/sq-m)  Left atrium: Mild enlargement  Right atrium: Mild enlargement  Mitral valve: Moderate mitral regurgitation (regurgitant fraction 27%)  Aortic valve: No regurgitation  Tricuspid valve: Moderate tricuspid regurgitation (regurgitant fraction 25%)  Pulmonic valve: No regurgitation  Aorta: Normal proximal ascending aorta  Pericardium:  Normal  IMPRESSION: 1. Severe LV dilatation with severe systolic dysfunction (EF 28%). Thinning/akinesis of inferior and lateral walls.  2.  Mild RV dilatation with mild systolic dysfunction (EF 40%)  3. While no late gadolinium enhancement is seen, there is severe thinning (less than 4.76mm) in the apical to basal lateral wall and apical inferior wall, suggesting nonviability in these regions.  4.  Moderate mitral regurgitation (regurgitant fraction 27%)  5.  Moderate tricuspid regurgitation (regurgitant fraction 25%)   Electronically Signed By: Lonni Nanas M.D. On: 01/20/2022 23:31   ______________________________________________________________________________________________       Current Reported Medications:.    Current Meds  Medication Sig   acetaminophen  (TYLENOL ) 500 MG tablet Take 500-1,000 mg by mouth every 6 (six) hours as needed (for pain.).   apixaban  (ELIQUIS ) 5 MG TABS tablet Take 1 tablet (5 mg total) by mouth 2 (two) times daily.   carvedilol  (COREG ) 3.125 MG tablet Take 1 tablet by mouth twice daily   empagliflozin  (JARDIANCE ) 10 MG TABS tablet Take 1 tablet (10 mg total) by mouth daily before breakfast.   gabapentin  (NEURONTIN ) 300 MG capsule Take 600 mg by mouth at bedtime.   glipiZIDE  (GLUCOTROL  XL) 5 MG 24 hr tablet Take 5 mg by mouth daily.   metFORMIN  (GLUCOPHAGE -XR) 500 MG 24 hr tablet Take 1,000 mg by mouth 2 (two) times daily.   ONETOUCH VERIO test strip 1 each daily.   rosuvastatin  (CRESTOR ) 10 MG tablet Take 1 tablet (10 mg total) by mouth every other day.   torsemide  (DEMADEX ) 20 MG tablet Take 1 tablet (20 mg total) by mouth daily. (Patient taking differently: Take 20 mg by mouth daily. Taking every other day)   Physical Exam:    VS:  BP 122/64   Pulse 65   Ht 6' 1 (1.854 m)   Wt 201 lb (91.2 kg)   SpO2 94%   BMI 26.52 kg/m    Wt Readings from Last 3 Encounters:  02/15/24 201 lb (91.2 kg)  02/04/24 194 lb 3.2 oz (88.1 kg)   12/03/23 207 lb 9.6 oz (94.2 kg)    GEN: Well nourished, well developed in no acute distress NECK: No JVD; No carotid bruits CARDIAC: RRR, no murmurs, rubs, gallops RESPIRATORY:  Clear to auscultation  without rales, wheezing or rhonchi  ABDOMEN: Soft, non-tender, non-distended EXTREMITIES:  No edema; No acute deformity     Asessement and Plan:.    Chronic systolic heart failure: EF 28% by CT MRI in 12/2021, CRT-D inserted in 2023.  Echo on 02/19/2023 indicated LVEF of 30 to 35%, LV with moderately decreased function, LV demonstrating regional wall motion abnormalities, LV internal cavity size is moderately dilated, grade 3 diastolic dysfunction.  RV systolic function was mildly reduced.  There was severely elevated pulmonary artery systolic pressure. On 06/07/23 he noted increased shortness of breath, fatigue and lower extremity edema, he noted a 3 to 4 lb weight gain in the weeks prior. He was started on Furiosx and completed three injections with significant improvement.  Seen in clinic by Dr. Mona at the beginning of March and was doing well overall, OptiVol thoracic impedance suggested improvement in fluid accumulation.  Patient had chest x-ray completed by his PCP that indicated pleural effusions per report.  At office visit on 4/2 patient reported increased shortness of breath, mild orthopnea and increased lower extremity edema.  Patient was started on Furoscix  for 3 doses.  Patient felt that his dry weight now appear to be around 200 pounds.  Patient daughter noted concerns for decreased appetite with increased doses of Jardiance , at last office visit his Jardiance  was reduced to 10 mg daily.  Today he reports some improvement in appetite with decreased dose of Jardiance .  Patient has not noticed a significant change in how he is feeling with torsemide  20 mg every other day, notes that he has had some weight gain however denies any significant shortness of breath, lower extremity edema, orthopnea  or PND.  Will have device interrogation to review thoracic impedance.  Will also check BMET and BNP. Encouraged patient to continue monitoring his weights at home and notify the office of weight gain of 2 to 3 pounds overnight or 5 pounds in a week, increased shortness of breath or lower extremity edema.  Discussed that patient may take an extra dose of torsemide  20 mg daily as needed for the above-mentioned.  Reviewed ED precautions.  Continue carvedilol  3.125 mg twice daily, Jardiance  10 mg daily, torsemide  20 mg every other day. Check echocardiogram.    Atrial fibrillation: Underwent DCCV in 04/2019 and 09/2022. Following cardioversion in April 2024 his ICD transmission suggested he had an early return to A-fib after. Underwent amiodarone  loading and DCCV on 12/23/2022. On 01/19/23 he underwent afib ablation with Dr. Inocencio. Amiodarone  discontinued in November, 2024.  Patient denies any palpitations or feeling of increased heart rates, continue Eliquis  5 mg twice daily.  Patient denies any bleeding problems. Check BMET today.    GI bleeding/chronic anticoagulation: Patient had a colonoscopy on 04/17/2023, admitted with BR B PR on 04/18/23. He received 1 unit PRBC and IV fluids in setting of hypotension. Underwent repeat colonoscopy with further clips placed. Patient denied any further bleeding.  He received 2 iron infusions. He was restarted on Eliquis  5 mg twice daily. Patient underwent recent colonoscopy, tolerated well, no further intervention needed at this time. Patient denies any bleeding.    CAD: S/p CABG in 2013.  Last heart catheterization on 11/2021 indicated occluded SVG to PDA and SVG to PLA, patent LIMA to LAD.  Recommended to treat medically. Stable with no anginal symptoms. No indication for ischemic evaluation.  No ASA given need for Eliquis .    Hypertension: Blood pressure today 120/54, on recheck was 122/64.  Continue carvedilol  3.125 mg  twice daily, Jardiance  10 mg daily and torsemide .    Hyperlipidemia: On rosuvastatin  10 mg every other day.    OSA on Bipap: Patient reports nightly compliance.  Patient is now being followed by Dr. Shlomo, notes recent BiPAP titrations.   Disposition: F/u with Dr. Mona or Jaysin Gayler, NP in three months or sooner if needed.   Signed, Raveena Hebdon D Bryleigh Ottaway, NP

## 2024-02-15 ENCOUNTER — Telehealth: Payer: Self-pay

## 2024-02-15 ENCOUNTER — Encounter: Payer: Self-pay | Admitting: Cardiology

## 2024-02-15 ENCOUNTER — Ambulatory Visit: Attending: Cardiology | Admitting: Cardiology

## 2024-02-15 VITALS — BP 122/64 | HR 65 | Ht 73.0 in | Wt 201.0 lb

## 2024-02-15 DIAGNOSIS — I255 Ischemic cardiomyopathy: Secondary | ICD-10-CM | POA: Insufficient documentation

## 2024-02-15 DIAGNOSIS — R5383 Other fatigue: Secondary | ICD-10-CM | POA: Diagnosis not present

## 2024-02-15 DIAGNOSIS — I5022 Chronic systolic (congestive) heart failure: Secondary | ICD-10-CM | POA: Insufficient documentation

## 2024-02-15 DIAGNOSIS — G4733 Obstructive sleep apnea (adult) (pediatric): Secondary | ICD-10-CM | POA: Diagnosis not present

## 2024-02-15 DIAGNOSIS — I10 Essential (primary) hypertension: Secondary | ICD-10-CM | POA: Diagnosis not present

## 2024-02-15 DIAGNOSIS — I4819 Other persistent atrial fibrillation: Secondary | ICD-10-CM | POA: Diagnosis not present

## 2024-02-15 DIAGNOSIS — Z95810 Presence of automatic (implantable) cardiac defibrillator: Secondary | ICD-10-CM | POA: Diagnosis not present

## 2024-02-15 DIAGNOSIS — E785 Hyperlipidemia, unspecified: Secondary | ICD-10-CM | POA: Diagnosis not present

## 2024-02-15 DIAGNOSIS — Z951 Presence of aortocoronary bypass graft: Secondary | ICD-10-CM | POA: Diagnosis not present

## 2024-02-15 DIAGNOSIS — R531 Weakness: Secondary | ICD-10-CM | POA: Diagnosis not present

## 2024-02-15 NOTE — Telephone Encounter (Signed)
 Spoke with patient and assisted with sending manual remote transmission.

## 2024-02-15 NOTE — Patient Instructions (Addendum)
 Medication Instructions:  No changes *If you need a refill on your cardiac medications before your next appointment, please call your pharmacy*  Lab Work: Today we are going to draw a Bmet and BNP If you have labs (blood work) drawn today and your tests are completely normal, you will receive your results only by: MyChart Message (if you have MyChart) OR A paper copy in the mail If you have any lab test that is abnormal or we need to change your treatment, we will call you to review the results.  Testing/Procedures: Your physician has requested that you have an echocardiogram. Echocardiography is a painless test that uses sound waves to create images of your heart. It provides your doctor with information about the size and shape of your heart and how well your heart's chambers and valves are working. This procedure takes approximately one hour. There are no restrictions for this procedure. Please do NOT wear cologne, perfume, aftershave, or lotions (deodorant is allowed). Please arrive 15 minutes prior to your appointment time.  Please note: We ask at that you not bring children with you during ultrasound (echo/ vascular) testing. Due to room size and safety concerns, children are not allowed in the ultrasound rooms during exams. Our front office staff cannot provide observation of children in our lobby area while testing is being conducted. An adult accompanying a patient to their appointment will only be allowed in the ultrasound room at the discretion of the ultrasound technician under special circumstances. We apologize for any inconvenience.  Follow-Up: At [facility], you and your health needs are our priority.  As part of our continuing mission to provide you with exceptional heart care, our providers are all part of one team.  This team includes your primary Cardiologist (physician) and Advanced Practice Providers or APPs (Physician Assistants and Nurse Practitioners) who all  work together to provide you with the care you need, when you need it.  Your next appointment:   3 month(s)  Provider:   Vinie JAYSON Maxcy, MD

## 2024-02-15 NOTE — Telephone Encounter (Signed)
 Spoke with patient.  Requested to send manual remote transmission and he will need assistance but was not home. Will call back later today and provide assistance to send transmission.

## 2024-02-15 NOTE — Progress Notes (Signed)
 EPIC Encounter for ICM Monitoring  Patient Name: [name] [name] is a 78 y.o. male Date: 02/15/2024 Primary Care Physican: Atilano Deward ORN, MD Primary Cardiologist: [facility]        Electrophysiologist: Camnitz Bi-V Pacing: 96.8%         09/21/2022 Weight: 213 lbs 10/06/2022 Weight: 219 lbs  11/17/2022 Weight: 212.5 lbs 12/22/2022 Weight: 211 lbs 12/31/024 Weight: 207.5 lbs 08/04/2023 Weight: 213 lbs 10/21/2023 Weight: 200 lbs 12/15/2023 Weight: 200 lbs 8/8//2025 Weight: 192 lbs   Clinical Status (01-Feb-2024 to 15-Feb-2024) Time in AT/AF  0.0 hr/day (0.0%)                                              Spoke with patient and heart failure questions reviewed.  Transmission results reviewed.  Pt asymptomatic for fluid accumulation.  Reports feeling well at this time and voices no complaints.     Optivol thoracic impedance suggesting normal fluid levels continue on Torsemide  20 mg every other day.      Prescribed:  Torsemide  20 mg take 1 tablet(s) (20 mg total) by mouth once a day.    Starting 8/4 Taking every other day as recommended by [name], NP.    Labs: 12/03/2023 Creatinine 1.08, BUN 21, Potassium 4.2, Sodium 141, GFR 70 10/20/2023 Creatinine 1.25, BUN 20, Potassium 4.6, Sodium 141, GFR 59 10/04/2023 Creatinine 1.33, BUN 21, Potassium 4.2, Sodium 138, GFR 54, BNP 5,155  09/28/2023 Creatinine 1.17, BUN 23, Potassium 4.2, Sodium 140, GFR 63, BNP 5,442 A complete set of results can be found in Results Review.   Recommendations:  No changes and encouraged to call if experiencing any fluid symptoms.     Follow-up plan: ICM clinic phone appointment on 03/13/2024.   91 day device clinic remote transmission 05/01/2024.     EP/Cardiology Office Visits:  05/18/2024 with Dr Mona.   Recall 09/22/2024 with Dr Inocencio.      Copy of ICM check sent to Dr. Inocencio and [name] NP as [name].    3 month ICM trend: 02/15/2024.    12-14 Month ICM trend:     Mitzie GORMAN Garner, RN 02/15/2024 2:29  PM

## 2024-02-16 ENCOUNTER — Ambulatory Visit: Payer: Self-pay | Admitting: Cardiology

## 2024-02-16 LAB — BASIC METABOLIC PANEL WITH GFR
BUN/Creatinine Ratio: 19 (ref 10–24)
BUN: 21 mg/dL (ref 8–27)
CO2: 22 mmol/L (ref 20–29)
Calcium: 8.8 mg/dL (ref 8.6–10.2)
Chloride: 104 mmol/L (ref 96–106)
Creatinine, Ser: 1.09 mg/dL (ref 0.76–1.27)
Glucose: 141 mg/dL — ABNORMAL HIGH (ref 70–99)
Potassium: 4.4 mmol/L (ref 3.5–5.2)
Sodium: 143 mmol/L (ref 134–144)
eGFR: 69 mL/min/1.73 (ref >59)

## 2024-02-16 LAB — PRO B NATRIURETIC PEPTIDE: NT-Pro BNP: 5569 pg/mL — ABNORMAL HIGH (ref 0–486)

## 2024-02-17 DIAGNOSIS — R531 Weakness: Secondary | ICD-10-CM | POA: Diagnosis not present

## 2024-02-17 DIAGNOSIS — R5383 Other fatigue: Secondary | ICD-10-CM | POA: Diagnosis not present

## 2024-02-22 DIAGNOSIS — R5383 Other fatigue: Secondary | ICD-10-CM | POA: Diagnosis not present

## 2024-02-22 DIAGNOSIS — R531 Weakness: Secondary | ICD-10-CM | POA: Diagnosis not present

## 2024-02-24 DIAGNOSIS — R531 Weakness: Secondary | ICD-10-CM | POA: Diagnosis not present

## 2024-02-24 DIAGNOSIS — R5383 Other fatigue: Secondary | ICD-10-CM | POA: Diagnosis not present

## 2024-02-25 ENCOUNTER — Other Ambulatory Visit (HOSPITAL_COMMUNITY)

## 2024-03-02 DIAGNOSIS — R5383 Other fatigue: Secondary | ICD-10-CM | POA: Diagnosis not present

## 2024-03-02 DIAGNOSIS — R531 Weakness: Secondary | ICD-10-CM | POA: Diagnosis not present

## 2024-03-06 DIAGNOSIS — R5383 Other fatigue: Secondary | ICD-10-CM | POA: Diagnosis not present

## 2024-03-06 DIAGNOSIS — R531 Weakness: Secondary | ICD-10-CM | POA: Diagnosis not present

## 2024-03-09 DIAGNOSIS — R531 Weakness: Secondary | ICD-10-CM | POA: Diagnosis not present

## 2024-03-09 DIAGNOSIS — R5383 Other fatigue: Secondary | ICD-10-CM | POA: Diagnosis not present

## 2024-03-13 ENCOUNTER — Encounter: Attending: Cardiology

## 2024-03-13 ENCOUNTER — Telehealth: Payer: Self-pay

## 2024-03-13 DIAGNOSIS — Z95810 Presence of automatic (implantable) cardiac defibrillator: Secondary | ICD-10-CM | POA: Insufficient documentation

## 2024-03-13 DIAGNOSIS — I5022 Chronic systolic (congestive) heart failure: Secondary | ICD-10-CM | POA: Insufficient documentation

## 2024-03-13 NOTE — Progress Notes (Addendum)
 Spoke with patient. He reports the following fluid symptoms:  Weight gain of 12 lbs in the last month.  Current weight 204 lbs (8/8 weight was 192 lbs) Increase in shortness of breath, Swelling in both legs.    He reports he is not feeling well.  Confirmed he is taking Torsemide  20 mg every other day (decreased from daily as recommended at 8/4 mychart note)  Per 8/4 Mychart note, Katlyn says patient can increase Torsemide  back to daily if he notices weight gain of 2-3 lbs overnight or 5 pounds in a week, increased shortness of breath or worsening swelling in your legs.    Pt reports he has taken 1 Torsemide  for 3 days in a row but still has fluid symptoms and no weight loss.  Advised to take 2 Torsemide  tablets tomorrow morning only on 9/16 then 1 tablet daily x 4 days or until fluid symptoms resolve.  Advised will send to [name], NP for review of plan and to inquire if, after fluid sx resolve, if he should stay on Torsemide  daily or if he should return to every other day.

## 2024-03-13 NOTE — Progress Notes (Signed)
 EPIC Encounter for ICM Monitoring  Patient Name: [name] [name] is a 78 y.o. male Date: 03/13/2024 Primary Care Physican: Atilano Deward ORN, MD Primary Cardiologist: [facility]        Electrophysiologist: Camnitz Bi-V Pacing: 97.3%         09/21/2022 Weight: 213 lbs 10/06/2022 Weight: 219 lbs  11/17/2022 Weight: 212.5 lbs 12/22/2022 Weight: 211 lbs 12/31/024 Weight: 207.5 lbs 08/04/2023 Weight: 213 lbs 10/21/2023 Weight: 200 lbs 12/15/2023 Weight: 200 lbs 8/8//2025 Weight: 192 lbs   Clinical Status Since 15-Feb-2024 Time in AT/AF  0.0 hr/day (0.0%)                                              Attempted call to patient and unable to reach.  Left message for return call.  Transmission results reviewed.      Optivol thoracic impedance suggesting possible fluid accumulation starting 8/18 but trending back toward baseline.   Fluid index is greater than normal since 9/9.   Prescribed:  Torsemide  20 mg take 1 tablet(s) (20 mg total) by mouth once a day.    Starting 8/4 Taking every other day as recommended by [name], NP.    Labs: 12/03/2023 Creatinine 1.08, BUN 21, Potassium 4.2, Sodium 141, GFR 70 10/20/2023 Creatinine 1.25, BUN 20, Potassium 4.6, Sodium 141, GFR 59 10/04/2023 Creatinine 1.33, BUN 21, Potassium 4.2, Sodium 138, GFR 54, BNP 5,155  09/28/2023 Creatinine 1.17, BUN 23, Potassium 4.2, Sodium 140, GFR 63, BNP 5,442 A complete set of results can be found in Results Review.   Recommendations:  Unable to reach.  Will send copy to [name] NP for review if patient is reached.    Follow-up plan: ICM clinic phone appointment on 03/27/2024 to check the stability of fluid levels.   91 day device clinic remote transmission 05/01/2024.     EP/Cardiology Office Visits:  05/18/2024 with Dr Mona.   Recall 09/22/2024 with Dr Inocencio.      Copy of ICM check sent to Dr. Inocencio.  3 month ICM trend: 03/13/2024.    12-14 Month ICM trend:     Mitzie GORMAN Garner, RN 03/13/2024 11:32 AM

## 2024-03-13 NOTE — Telephone Encounter (Signed)
Remote ICM transmission received.  Attempted call to patient regarding ICM remote transmission and left message to return call   

## 2024-03-20 DIAGNOSIS — H35371 Puckering of macula, right eye: Secondary | ICD-10-CM | POA: Diagnosis not present

## 2024-03-20 LAB — HM DIABETES EYE EXAM

## 2024-03-23 DIAGNOSIS — R531 Weakness: Secondary | ICD-10-CM | POA: Diagnosis not present

## 2024-03-23 DIAGNOSIS — R5383 Other fatigue: Secondary | ICD-10-CM | POA: Diagnosis not present

## 2024-03-27 ENCOUNTER — Encounter

## 2024-03-27 NOTE — Progress Notes (Signed)
 Remote ICD Transmission

## 2024-03-29 DIAGNOSIS — R5383 Other fatigue: Secondary | ICD-10-CM | POA: Diagnosis not present

## 2024-03-29 DIAGNOSIS — R531 Weakness: Secondary | ICD-10-CM | POA: Diagnosis not present

## 2024-03-29 NOTE — Progress Notes (Signed)
 No ICM remote transmission received for 03/27/2024 and next ICM transmission scheduled for 04/17/2024.

## 2024-04-04 DIAGNOSIS — R531 Weakness: Secondary | ICD-10-CM | POA: Diagnosis not present

## 2024-04-04 DIAGNOSIS — R5383 Other fatigue: Secondary | ICD-10-CM | POA: Diagnosis not present

## 2024-04-07 DIAGNOSIS — R531 Weakness: Secondary | ICD-10-CM | POA: Diagnosis not present

## 2024-04-07 DIAGNOSIS — R5383 Other fatigue: Secondary | ICD-10-CM | POA: Diagnosis not present

## 2024-04-11 DIAGNOSIS — R5383 Other fatigue: Secondary | ICD-10-CM | POA: Diagnosis not present

## 2024-04-11 DIAGNOSIS — R531 Weakness: Secondary | ICD-10-CM | POA: Diagnosis not present

## 2024-04-12 ENCOUNTER — Encounter (INDEPENDENT_AMBULATORY_CARE_PROVIDER_SITE_OTHER): Payer: Self-pay | Admitting: Gastroenterology

## 2024-04-13 DIAGNOSIS — R531 Weakness: Secondary | ICD-10-CM | POA: Diagnosis not present

## 2024-04-13 DIAGNOSIS — R5383 Other fatigue: Secondary | ICD-10-CM | POA: Diagnosis not present

## 2024-04-17 ENCOUNTER — Ambulatory Visit: Admitting: Cardiology

## 2024-04-17 ENCOUNTER — Encounter

## 2024-04-18 ENCOUNTER — Encounter: Payer: Self-pay | Admitting: Internal Medicine

## 2024-04-18 DIAGNOSIS — I5022 Chronic systolic (congestive) heart failure: Secondary | ICD-10-CM | POA: Diagnosis not present

## 2024-04-18 DIAGNOSIS — J189 Pneumonia, unspecified organism: Secondary | ICD-10-CM | POA: Diagnosis not present

## 2024-04-18 DIAGNOSIS — Z87891 Personal history of nicotine dependence: Secondary | ICD-10-CM | POA: Diagnosis not present

## 2024-04-18 DIAGNOSIS — R079 Chest pain, unspecified: Secondary | ICD-10-CM | POA: Diagnosis not present

## 2024-04-18 DIAGNOSIS — I5023 Acute on chronic systolic (congestive) heart failure: Secondary | ICD-10-CM | POA: Diagnosis not present

## 2024-04-18 DIAGNOSIS — E119 Type 2 diabetes mellitus without complications: Secondary | ICD-10-CM | POA: Diagnosis not present

## 2024-04-18 DIAGNOSIS — I272 Pulmonary hypertension, unspecified: Secondary | ICD-10-CM | POA: Diagnosis not present

## 2024-04-18 DIAGNOSIS — Z951 Presence of aortocoronary bypass graft: Secondary | ICD-10-CM | POA: Diagnosis not present

## 2024-04-18 DIAGNOSIS — Z8673 Personal history of transient ischemic attack (TIA), and cerebral infarction without residual deficits: Secondary | ICD-10-CM | POA: Diagnosis not present

## 2024-04-18 DIAGNOSIS — J90 Pleural effusion, not elsewhere classified: Secondary | ICD-10-CM | POA: Diagnosis not present

## 2024-04-18 DIAGNOSIS — I2510 Atherosclerotic heart disease of native coronary artery without angina pectoris: Secondary | ICD-10-CM | POA: Diagnosis present

## 2024-04-18 DIAGNOSIS — Z7901 Long term (current) use of anticoagulants: Secondary | ICD-10-CM | POA: Diagnosis not present

## 2024-04-18 DIAGNOSIS — E1140 Type 2 diabetes mellitus with diabetic neuropathy, unspecified: Secondary | ICD-10-CM | POA: Diagnosis present

## 2024-04-18 DIAGNOSIS — I5021 Acute systolic (congestive) heart failure: Secondary | ICD-10-CM | POA: Diagnosis not present

## 2024-04-18 DIAGNOSIS — H8110 Benign paroxysmal vertigo, unspecified ear: Secondary | ICD-10-CM | POA: Diagnosis present

## 2024-04-18 DIAGNOSIS — R110 Nausea: Secondary | ICD-10-CM | POA: Diagnosis not present

## 2024-04-18 DIAGNOSIS — I509 Heart failure, unspecified: Secondary | ICD-10-CM | POA: Diagnosis not present

## 2024-04-18 DIAGNOSIS — H81399 Other peripheral vertigo, unspecified ear: Secondary | ICD-10-CM | POA: Diagnosis present

## 2024-04-18 DIAGNOSIS — I4891 Unspecified atrial fibrillation: Secondary | ICD-10-CM | POA: Diagnosis not present

## 2024-04-18 DIAGNOSIS — J3489 Other specified disorders of nose and nasal sinuses: Secondary | ICD-10-CM | POA: Diagnosis not present

## 2024-04-18 DIAGNOSIS — R739 Hyperglycemia, unspecified: Secondary | ICD-10-CM | POA: Diagnosis not present

## 2024-04-18 DIAGNOSIS — Z20822 Contact with and (suspected) exposure to covid-19: Secondary | ICD-10-CM | POA: Diagnosis not present

## 2024-04-18 DIAGNOSIS — Z9981 Dependence on supplemental oxygen: Secondary | ICD-10-CM | POA: Diagnosis not present

## 2024-04-18 DIAGNOSIS — R0989 Other specified symptoms and signs involving the circulatory and respiratory systems: Secondary | ICD-10-CM | POA: Diagnosis not present

## 2024-04-18 DIAGNOSIS — Z7984 Long term (current) use of oral hypoglycemic drugs: Secondary | ICD-10-CM | POA: Diagnosis not present

## 2024-04-18 DIAGNOSIS — I639 Cerebral infarction, unspecified: Secondary | ICD-10-CM | POA: Diagnosis not present

## 2024-04-18 DIAGNOSIS — Z743 Need for continuous supervision: Secondary | ICD-10-CM | POA: Diagnosis not present

## 2024-04-18 DIAGNOSIS — R531 Weakness: Secondary | ICD-10-CM | POA: Diagnosis not present

## 2024-04-18 DIAGNOSIS — E785 Hyperlipidemia, unspecified: Secondary | ICD-10-CM | POA: Diagnosis not present

## 2024-04-18 DIAGNOSIS — I081 Rheumatic disorders of both mitral and tricuspid valves: Secondary | ICD-10-CM | POA: Diagnosis present

## 2024-04-18 DIAGNOSIS — R42 Dizziness and giddiness: Secondary | ICD-10-CM | POA: Diagnosis not present

## 2024-04-18 DIAGNOSIS — J9601 Acute respiratory failure with hypoxia: Secondary | ICD-10-CM | POA: Diagnosis not present

## 2024-04-18 DIAGNOSIS — R197 Diarrhea, unspecified: Secondary | ICD-10-CM | POA: Diagnosis not present

## 2024-04-18 DIAGNOSIS — I5020 Unspecified systolic (congestive) heart failure: Secondary | ICD-10-CM | POA: Diagnosis not present

## 2024-04-18 DIAGNOSIS — Z95810 Presence of automatic (implantable) cardiac defibrillator: Secondary | ICD-10-CM | POA: Diagnosis not present

## 2024-04-19 NOTE — Progress Notes (Signed)
 No ICM remote transmission received for 04/17/2024 and next ICM transmission scheduled for 05/02/2024.

## 2024-04-20 ENCOUNTER — Telehealth: Payer: Self-pay | Admitting: Internal Medicine

## 2024-04-20 NOTE — Telephone Encounter (Signed)
 Will forward this call to Dr Mona and his nurse Jenna to address Friday AM while Dr Mona is in the office.

## 2024-04-20 NOTE — Telephone Encounter (Signed)
 Dr. Goble stated patient is in hospital now and wants a call back regarding next steps for patient care.

## 2024-04-21 ENCOUNTER — Encounter: Payer: Self-pay | Admitting: Internal Medicine

## 2024-04-21 NOTE — Telephone Encounter (Signed)
 Mona Vinie BROCKS, MD to Theotis Sharlet PARAS, RN  Jahden Schara M, RN (Selected Message)     04/21/24 11:48 AM Thanks for notifying me that he was hospitalized. Happy to see him in follow-up after discharge.   Dr. VEAR

## 2024-04-26 NOTE — Telephone Encounter (Signed)
 Faxed records request to: [phone]  Routed to [facility]

## 2024-05-01 ENCOUNTER — Ambulatory Visit (INDEPENDENT_AMBULATORY_CARE_PROVIDER_SITE_OTHER): Payer: Medicare Other

## 2024-05-01 DIAGNOSIS — I5022 Chronic systolic (congestive) heart failure: Secondary | ICD-10-CM

## 2024-05-02 ENCOUNTER — Ambulatory Visit
Admission: RE | Admit: 2024-05-02 | Discharge: 2024-05-02 | Disposition: A | Discharge status: Home / Self Care | Source: Ambulatory Visit | Attending: Cardiology | Admitting: Cardiology

## 2024-05-02 ENCOUNTER — Ambulatory Visit

## 2024-05-02 DIAGNOSIS — I5022 Chronic systolic (congestive) heart failure: Secondary | ICD-10-CM | POA: Diagnosis not present

## 2024-05-02 DIAGNOSIS — Z95810 Presence of automatic (implantable) cardiac defibrillator: Secondary | ICD-10-CM

## 2024-05-02 LAB — CUP PACEART REMOTE DEVICE CHECK
Battery Remaining Longevity: 79 mo
Battery Voltage: 2.99 V
Brady Statistic AP VP Percent: 13.92 %
Brady Statistic AP VS Percent: 0.03 %
Brady Statistic AS VP Percent: 85.39 %
Brady Statistic AS VS Percent: 0.66 %
Brady Statistic RA Percent Paced: 13.2 %
Brady Statistic RV Percent Paced: 93.79 %
Date Time Interrogation Session: 20251103012503
HighPow Impedance: 57 Ohm
Implantable Lead Connection Status: 753985
Implantable Lead Connection Status: 753985
Implantable Lead Connection Status: 753985
Implantable Lead Implant Date: 20231102
Implantable Lead Implant Date: 20231102
Implantable Lead Implant Date: 20231102
Implantable Lead Location: 753858
Implantable Lead Location: 753859
Implantable Lead Location: 753860
Implantable Lead Model: 4598
Implantable Lead Model: 5076
Implantable Lead Model: 6935
Implantable Pulse Generator Implant Date: 20231102
Lead Channel Impedance Value: 141.867
Lead Channel Impedance Value: 141.867
Lead Channel Impedance Value: 141.867
Lead Channel Impedance Value: 152 Ohm
Lead Channel Impedance Value: 152 Ohm
Lead Channel Impedance Value: 266 Ohm
Lead Channel Impedance Value: 266 Ohm
Lead Channel Impedance Value: 304 Ohm
Lead Channel Impedance Value: 304 Ohm
Lead Channel Impedance Value: 304 Ohm
Lead Channel Impedance Value: 380 Ohm
Lead Channel Impedance Value: 399 Ohm
Lead Channel Impedance Value: 456 Ohm
Lead Channel Impedance Value: 494 Ohm
Lead Channel Impedance Value: 494 Ohm
Lead Channel Impedance Value: 513 Ohm
Lead Channel Impedance Value: 513 Ohm
Lead Channel Impedance Value: 513 Ohm
Lead Channel Pacing Threshold Amplitude: 0.5 V
Lead Channel Pacing Threshold Amplitude: 0.75 V
Lead Channel Pacing Threshold Amplitude: 0.875 V
Lead Channel Pacing Threshold Pulse Width: 0.4 ms
Lead Channel Pacing Threshold Pulse Width: 0.4 ms
Lead Channel Pacing Threshold Pulse Width: 0.4 ms
Lead Channel Sensing Intrinsic Amplitude: 2 mV
Lead Channel Sensing Intrinsic Amplitude: 2 mV
Lead Channel Sensing Intrinsic Amplitude: 6.75 mV
Lead Channel Sensing Intrinsic Amplitude: 6.75 mV
Lead Channel Setting Pacing Amplitude: 1.5 V
Lead Channel Setting Pacing Amplitude: 2 V
Lead Channel Setting Pacing Amplitude: 2 V
Lead Channel Setting Pacing Pulse Width: 0.4 ms
Lead Channel Setting Pacing Pulse Width: 0.4 ms
Lead Channel Setting Sensing Sensitivity: 0.3 mV
Zone Setting Status: 755011

## 2024-05-02 LAB — ECHOCARDIOGRAM COMPLETE
Area-P 1/2: 5.27 cm2
MV M vel: 4.39 m/s
MV Peak grad: 77.1 mmHg
Radius: 0.4 cm
S' Lateral: 5.4 cm
Single Plane A2C EF: 23.7 %

## 2024-05-02 MED ORDER — PERFLUTREN LIPID MICROSPHERE
1.0000 mL | INTRAVENOUS | Status: AC | PRN
  Administered 2024-05-02: 3 mL via INTRAVENOUS

## 2024-05-02 NOTE — Progress Notes (Signed)
*  PRELIMINARY RESULTS* Echocardiogram 2D Echocardiogram has been performed with Definity .  [name] [name] 05/02/2024, 12:23 PM

## 2024-05-04 DIAGNOSIS — R5383 Other fatigue: Secondary | ICD-10-CM | POA: Diagnosis not present

## 2024-05-04 DIAGNOSIS — R531 Weakness: Secondary | ICD-10-CM | POA: Diagnosis not present

## 2024-05-05 NOTE — Progress Notes (Signed)
 EPIC Encounter for ICM Monitoring  Patient Name: [name] [name] is a 77 y.o. male Date: 05/05/2024 Primary Care Physican: Atilano Deward ORN, MD Primary Cardiologist: [facility]        Electrophysiologist: Camnitz Bi-V Pacing: 93.8%         09/21/2022 Weight: 213 lbs 10/06/2022 Weight: 219 lbs  11/17/2022 Weight: 212.5 lbs 12/22/2022 Weight: 211 lbs 12/31/024 Weight: 207.5 lbs 08/04/2023 Weight: 213 lbs 10/21/2023 Weight: 200 lbs 12/15/2023 Weight: 200 lbs 8/8//2025 Weight: 192 lbs 04/23/2024 Hospital Weight: 185 lbs 05/05/2024 Weight: 177 lbs   Clinical Status Since 15-Feb-2024 Time in AT/AF  0.0 hr/day (0.0%)                                              Spoke with patient and heart failure questions reviewed.  Transmission results reviewed.  Pt asymptomatic for fluid accumulation or dehydration symptoms.   He is feeling better since hospital discharge after being diuresed 15 lbs of fluid.  His weight was 200 lbs when he was hospitalized.  Weight has dropped more since discharge which may be related to possible dryness as suggested by report.  04/18/2024-04/23/2024 Hospitalization at [facility] in [place], per notes Acute CHF.     Since 03/13/2024 ICM Remote Transmission: Optivol thoracic impedance suggesting possible dryness starting 04/19/2024.    Was suggesting possible fluid accumulation from 04/13/2024-04/18/2024 (hospitalization 04/18/2024-04/23/2024).     Prescribed:  Torsemide  20 mg take 1 tablet(s) (20 mg total) by mouth once a day.   Per Discharge note 04/23/2024, Torsemide  stopped and started 04/24/2024 Bumetanide 1 mg twice a day.    Labs: 04/23/2024 Creatinine 0.97, BUN 15, Potassium 3.6, Sodium 142, GFR 78.9 (per hospital note) 04/21/2024 Creatinine 1.07, BUN 17, Potassium 3.5, Sodium 141, GFR 70.1 (per hospital note) 04/20/2024 Creatinine 1.14, BUN 17, Potassium 3.5, Sodium 142, GFR 65.0 (per Hospital note) 12/03/2023 Creatinine 1.08, BUN 21, Potassium 4.2, Sodium  141, GFR 70 10/20/2023 Creatinine 1.25, BUN 20, Potassium 4.6, Sodium 141, GFR 59 10/04/2023 Creatinine 1.33, BUN 21, Potassium 4.2, Sodium 138, GFR 54, BNP 5,155  09/28/2023 Creatinine 1.17, BUN 23, Potassium 4.2, Sodium 140, GFR 63, BNP 5,442 A complete set of results can be found in Results Review.   Recommendations: Based on 01/03/2024, Advised to hold this afternoon dose of Bumex today only and then tomorrow resume twice a day.  Advised to drink 64 oz fluid daily to stay hydrated.     Follow-up plan: ICM clinic phone appointment on 05/09/2024 to check the stability of fluid levels post hospitalization.   91 day device clinic remote transmission 07/31/2024.     EP/Cardiology Office Visits:  05/18/2024 with Dr Mona.   Recall 09/22/2024 with Dr Inocencio.      Copy of ICM check sent to Dr. Inocencio.  Copy sent to Dr Mona for review and recommendations if needed.   Remote monitoring is medically necessary for Heart Failure Management.    Daily Thoracic Impedance ICM trend: 02/01/2024 through 05/05/2024.    12-14 Month Thoracic Impedance ICM trend:     Mitzie GORMAN Garner, RN 05/05/2024 12:54 PM

## 2024-05-05 NOTE — Progress Notes (Signed)
 Remote ICD Transmission

## 2024-05-09 ENCOUNTER — Ambulatory Visit: Attending: Cardiology

## 2024-05-09 ENCOUNTER — Ambulatory Visit: Payer: Self-pay | Admitting: Cardiology

## 2024-05-09 DIAGNOSIS — E1165 Type 2 diabetes mellitus with hyperglycemia: Secondary | ICD-10-CM | POA: Diagnosis not present

## 2024-05-09 DIAGNOSIS — Z95810 Presence of automatic (implantable) cardiac defibrillator: Secondary | ICD-10-CM

## 2024-05-09 DIAGNOSIS — R5383 Other fatigue: Secondary | ICD-10-CM | POA: Diagnosis not present

## 2024-05-09 DIAGNOSIS — R634 Abnormal weight loss: Secondary | ICD-10-CM | POA: Diagnosis not present

## 2024-05-09 DIAGNOSIS — E7849 Other hyperlipidemia: Secondary | ICD-10-CM | POA: Diagnosis not present

## 2024-05-09 DIAGNOSIS — R531 Weakness: Secondary | ICD-10-CM | POA: Diagnosis not present

## 2024-05-09 DIAGNOSIS — Z0001 Encounter for general adult medical examination with abnormal findings: Secondary | ICD-10-CM | POA: Diagnosis not present

## 2024-05-09 DIAGNOSIS — I5022 Chronic systolic (congestive) heart failure: Secondary | ICD-10-CM

## 2024-05-09 DIAGNOSIS — E876 Hypokalemia: Secondary | ICD-10-CM | POA: Diagnosis not present

## 2024-05-09 LAB — LAB REPORT - SCANNED
A1c: 6.7
EGFR: 77.9
PSA, Total: 1.4

## 2024-05-09 NOTE — Progress Notes (Signed)
  Received: Nilsa Maxcy, Vinie BROCKS, MD  [place] Zou, Mitzie RAMAN, RN Thanks, better to be dry than overloaded.

## 2024-05-09 NOTE — Progress Notes (Addendum)
 EPIC Encounter for ICM Monitoring  Patient Name: [name] [name] is a 78 y.o. male Date: 05/09/2024 Primary Care Physican: Atilano Deward ORN, MD Primary Cardiologist: [facility]        Electrophysiologist: Camnitz Bi-V Pacing: 94.0%         09/21/2022 Weight: 213 lbs 10/06/2022 Weight: 219 lbs  11/17/2022 Weight: 212.5 lbs 12/22/2022 Weight: 211 lbs 12/31/024 Weight: 207.5 lbs 08/04/2023 Weight: 213 lbs 10/21/2023 Weight: 200 lbs 12/15/2023 Weight: 200 lbs 8/8//2025 Weight: 192 lbs 04/23/2024 Hospital Weight: 185 lbs 05/05/2024 Weight: 177 lbs 05/09/2024 Weight: 178 lbs   Clinical Status Since 05-May-2024 Time in AT/AF  0.0 hr/day (0.0%)                                              Spoke with patient and heart failure questions reviewed.  Transmission results reviewed.  Pt asymptomatic for fluid accumulation or dehydration.  Reports feeling well at this time and voices no complaints.     Since 05/02/2024 ICM Remote Transmission: Optivol thoracic impedance suggesting possible dryness starting 04/19/2024 but slight trend toward baseline (per Dr Mona ICM note 05/09/2024 okay to be a little dry).     Prescribed:  Bumetanide 1 mg take 1 tablet by mouth twice a day.    Labs: 04/23/2024 Creatinine 0.97, BUN 15, Potassium 3.6, Sodium 142, GFR 78.9 (per Ezzard Candance Thresa Bernardino in [place] hospital note) 04/21/2024 Creatinine 1.07, BUN 17, Potassium 3.5, Sodium 141, GFR 70.1 (per Ezzard Candance Thresa Bernardino in [place] hospital note) 04/20/2024 Creatinine 1.14, BUN 17, Potassium 3.5, Sodium 142, GFR 65.0 (per Ezzard Candance Thresa Bernardino in [place] hospital note) 12/03/2023 Creatinine 1.08, BUN 21, Potassium 4.2, Sodium 141, GFR 70 10/20/2023 Creatinine 1.25, BUN 20, Potassium 4.6, Sodium 141, GFR 59 10/04/2023 Creatinine 1.33, BUN 21, Potassium 4.2, Sodium 138, GFR 54, BNP 5,155  09/28/2023 Creatinine 1.17, BUN 23, Potassium 4.2, Sodium 140, GFR 63, BNP 5,442 A complete set of results can be found in  Results Review.   Recommendations: No changes and encouraged to call if experiencing any fluid symptoms.     Follow-up plan: ICM clinic phone appointment on 06/12/2024.   91 day device clinic remote transmission 07/31/2024.     EP/Cardiology Office Visits:  05/18/2024 with Dr Mona.   Recall 09/22/2024 with Dr Inocencio.      Copy of ICM check sent to Dr. Inocencio.    Remote monitoring is medically necessary for Heart Failure Management.    Daily Thoracic Impedance ICM trend: 02/08/2024 through 05/09/2024.    12-14 Month Thoracic Impedance ICM trend:     Mitzie GORMAN Garner, RN 05/09/2024 7:29 AM

## 2024-05-11 DIAGNOSIS — R5383 Other fatigue: Secondary | ICD-10-CM | POA: Diagnosis not present

## 2024-05-11 DIAGNOSIS — R531 Weakness: Secondary | ICD-10-CM | POA: Diagnosis not present

## 2024-05-15 ENCOUNTER — Ambulatory Visit

## 2024-05-16 DIAGNOSIS — R634 Abnormal weight loss: Secondary | ICD-10-CM | POA: Diagnosis not present

## 2024-05-16 DIAGNOSIS — R5383 Other fatigue: Secondary | ICD-10-CM | POA: Diagnosis not present

## 2024-05-16 DIAGNOSIS — G4733 Obstructive sleep apnea (adult) (pediatric): Secondary | ICD-10-CM | POA: Diagnosis not present

## 2024-05-16 DIAGNOSIS — I739 Peripheral vascular disease, unspecified: Secondary | ICD-10-CM | POA: Diagnosis not present

## 2024-05-16 DIAGNOSIS — R531 Weakness: Secondary | ICD-10-CM | POA: Diagnosis not present

## 2024-05-16 DIAGNOSIS — I482 Chronic atrial fibrillation, unspecified: Secondary | ICD-10-CM | POA: Diagnosis not present

## 2024-05-16 DIAGNOSIS — R2689 Other abnormalities of gait and mobility: Secondary | ICD-10-CM | POA: Diagnosis not present

## 2024-05-16 DIAGNOSIS — I509 Heart failure, unspecified: Secondary | ICD-10-CM | POA: Diagnosis not present

## 2024-05-16 DIAGNOSIS — I255 Ischemic cardiomyopathy: Secondary | ICD-10-CM | POA: Diagnosis not present

## 2024-05-16 DIAGNOSIS — E1140 Type 2 diabetes mellitus with diabetic neuropathy, unspecified: Secondary | ICD-10-CM | POA: Diagnosis not present

## 2024-05-16 DIAGNOSIS — Z6823 Body mass index (BMI) 23.0-23.9, adult: Secondary | ICD-10-CM | POA: Diagnosis not present

## 2024-05-16 DIAGNOSIS — N401 Enlarged prostate with lower urinary tract symptoms: Secondary | ICD-10-CM | POA: Diagnosis not present

## 2024-05-17 DIAGNOSIS — H8111 Benign paroxysmal vertigo, right ear: Secondary | ICD-10-CM | POA: Diagnosis not present

## 2024-05-17 DIAGNOSIS — M1712 Unilateral primary osteoarthritis, left knee: Secondary | ICD-10-CM | POA: Diagnosis not present

## 2024-05-17 DIAGNOSIS — Z0110 Encounter for examination of ears and hearing without abnormal findings: Secondary | ICD-10-CM | POA: Diagnosis not present

## 2024-05-17 DIAGNOSIS — Z951 Presence of aortocoronary bypass graft: Secondary | ICD-10-CM | POA: Diagnosis not present

## 2024-05-17 DIAGNOSIS — Z79899 Other long term (current) drug therapy: Secondary | ICD-10-CM | POA: Diagnosis not present

## 2024-05-17 DIAGNOSIS — H903 Sensorineural hearing loss, bilateral: Secondary | ICD-10-CM | POA: Diagnosis not present

## 2024-05-17 DIAGNOSIS — R296 Repeated falls: Secondary | ICD-10-CM | POA: Diagnosis not present

## 2024-05-17 DIAGNOSIS — R2689 Other abnormalities of gait and mobility: Secondary | ICD-10-CM | POA: Diagnosis not present

## 2024-05-17 DIAGNOSIS — Z8673 Personal history of transient ischemic attack (TIA), and cerebral infarction without residual deficits: Secondary | ICD-10-CM | POA: Diagnosis not present

## 2024-05-17 DIAGNOSIS — Z9622 Myringotomy tube(s) status: Secondary | ICD-10-CM | POA: Diagnosis not present

## 2024-05-17 DIAGNOSIS — Z87828 Personal history of other (healed) physical injury and trauma: Secondary | ICD-10-CM | POA: Diagnosis not present

## 2024-05-17 DIAGNOSIS — Z950 Presence of cardiac pacemaker: Secondary | ICD-10-CM | POA: Diagnosis not present

## 2024-05-17 DIAGNOSIS — F17290 Nicotine dependence, other tobacco product, uncomplicated: Secondary | ICD-10-CM | POA: Diagnosis not present

## 2024-05-18 ENCOUNTER — Encounter: Payer: Self-pay | Admitting: Internal Medicine

## 2024-05-18 ENCOUNTER — Ambulatory Visit: Attending: Internal Medicine | Admitting: Internal Medicine

## 2024-05-18 VITALS — BP 122/70 | HR 67 | Ht 74.0 in | Wt 181.2 lb

## 2024-05-18 DIAGNOSIS — I5023 Acute on chronic systolic (congestive) heart failure: Secondary | ICD-10-CM | POA: Diagnosis not present

## 2024-05-18 DIAGNOSIS — I10 Essential (primary) hypertension: Secondary | ICD-10-CM | POA: Insufficient documentation

## 2024-05-18 DIAGNOSIS — I4819 Other persistent atrial fibrillation: Secondary | ICD-10-CM | POA: Insufficient documentation

## 2024-05-18 DIAGNOSIS — Z951 Presence of aortocoronary bypass graft: Secondary | ICD-10-CM | POA: Diagnosis not present

## 2024-05-18 DIAGNOSIS — I255 Ischemic cardiomyopathy: Secondary | ICD-10-CM | POA: Insufficient documentation

## 2024-05-18 DIAGNOSIS — Z95810 Presence of automatic (implantable) cardiac defibrillator: Secondary | ICD-10-CM | POA: Diagnosis not present

## 2024-05-18 MED ORDER — BUMETANIDE 0.5 MG PO TABS: ORAL_TABLET | ORAL | 1 refills | Status: AC

## 2024-05-18 NOTE — Progress Notes (Signed)
 OFFICE NOTE  Chief Complaint:  Follow-up CHF  Primary Care Physician: Atilano Deward ORN, MD  HPI:  [name] [name] is a pleasant 78 year old gentleman previously followed by Dr. Maye. His past medical history is significant for coronary artery disease. In 2013 he underwent multivessel CABG for an ischemic cardiomyopathy. EF was 20-25% prior to surgery however post surgery his EF had improved up to 45-50%. Recently his EF was 40-45% by echo in May of 2014. There was mild mitral regurgitation, mild to moderately dilated left atrium and mild LVH. [name] [name] has been describing some anxiety. He also reports some pain in his legs however underwent Dopplers in September 2014 which showed preserved ABIs bilaterally a 1.0 on the right and 1.1 on the left. The bilateral peroneal and posterior tibial arteries were occluded. He reports some optimal control of his diabetes. His A1c was 7.2. He is concerned about the cost of taking Tradjenta . He is followed by Dr. Mirna.   He was recently seen in the emergency room and [facility] hospital. There he presented with hypotension and was given 1 L normal saline and his symptoms improved. He had been having dizziness as well as some upper back pain into the left shoulder blade with exertion recently. The symptoms are worse particularly when climbing up ladders or going up stairs and are relieved at rest. After that hospitalization it was recommended that he discontinue his ACE inhibitor and beta blocker, and his blood pressure appears improved today up to 110/60.  [name] [name] returns today for followup of his stress test. This is interpreted as nonischemic with an EF of 44%. He reports still feeling very fatigued and extremely short of breath when doing activities. He says that the other day he tried to hit golf balls and felt that he was totally exhausted after just an hour. He reports his blood pressure has improved somewhat off of all medications however remains  low. He has had symptoms of orthostatic hypotension in the past. His symptoms do feel similar to prior to his bypass surgery. He also feels that he was much better after surgery than he is now and that his symptoms have been going on for the past 2 months.  At his last office visit, I recommended that [name] [name]. Tumbleson have a repeat cardiac catheterization (08/2013). This demonstrated the following:  Impression:  1. 2 vessel native CAD with occluded LAD in the mid-vessel  2. Patent LIMA-LAD, SVG to PLA and SVG to PDA grafts with TIMI III flow  3. LVEDP = 13 mmHg  4. Random PVC's were noted  Based on these findings, I could not find any new cardiac cause of his symptoms. I suspect that some of his fatigue could be related to labile blood sugars. He says unexplained hypotension but is normotensive off of medications.  [name] [name] returns today for followup. He now reports feeling better since he had change in his medications. He is established with Dr. Faythe who adjusted his diabetes medicines and stopped his Invokana. His shortness of breath and fatigue in both improved. He is now been expected some problems with left heel pain. He was found to have some spinal stenosis but has had improvement with inserts in his shoes.  I saw [name] [name] back in the office today. He has successfully underwent back surgery earlier this year which she says initially was much improved, however he says he subsequently developed more pain going down his legs. This sounds like it's neuropathic pain, but  is reluctant to try medication for it. He also significantly fatigue. This could be due to a number of etiologies. 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 Also, he is noted to have a history of obstructive sleep apnea. He was placed on BiPAP, but has not been compliant with that due to difficulty wearing the mask. He also never  had follow-up of his sleep study.  [name] [name] returns today for follow-up. He reports he's had worsening dyspnea and fatigue. He says that he can hardly sleep at night. He is struggling with significant anxiety. 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 He's not been wearing the BiPAP due to significant anxiety and difficulty sleeping. He feels like he gets smothered with his machine. Communication between Dr. Shlomo and Dr. Atilano led to the starting of Celexa as well as Klonopin to use as needed at night. He says that it does give him about 6 hours of sleep. He has not been using his BiPAP as instructed. He is reportedly had more worsening shortness of breath. He was seen in the ER for this and it was thought this was due [email or web address] with BiPAP. His BNP however is elevated over 300. Today in the office he says that he's had more swelling and more abdominal fullness as well as leg edema.  I had the pleasure seeing [name] [name] back today in the office. He seems to respond nicely to Lasix . I started him on 40 mg daily and while I was out of town my partner reviewed his lab work which included an elevated BNP over 300. He increased his Lasix  to 40 mg twice a day. [name] [name] says he's had a significant improvement in his breathing. The BNP now is in the mid 200s. Renal function is stable based on labs today. Unfortunately, his echocardiogram does show a new cardiomyopathy with EF 30-35%. It should be noted that he felt awful on blood pressure/heart failure medicines and had taken himself off of ACE inhibitor and beta blocker in the past. The echo however does suggest inferior and anterolateral wall motion abnormalities which are new and could indicate graft dysfunction. I performed his last heart catheterization in 2015 which showed all 3 grafts patent.  [name] [name] returns today for follow-up of his left heart catheterization. I perform this a  few weeks ago and he had patent bypass grafts however LV function is reduced. Cardiac output is also reduced. Filling pressures were mildly elevated. He reports since discharge that he's had some improvement of his symptoms. He was started on low-dose beta blocker but he does report fatigue. When I asked him how they compared to previously he said he thinks attack sleep better since he started on the beta blocker but not back to what he thinks his baseline should be. He continues to have problems with left leg pain. He had ultrasound of the left leg which were unrevealing. He also had nerve conduction studies which I sent him for which were not suggestive of neuropathy. His symptoms were worse while lying flat on the Cath Lab table and I suspect this could be again coming from his back and he may need repeat orthopedic evaluation. His primary care provider started him on Celexa recently as well for anxiety.  12/23/2015  [name] [name] was seen back in the office today in follow-up.  Overall he seems to be doing well. He's tolerating low-dose carvedilol  and has had some very infrequent morning dizziness. He was previously on Lasix  40 mg twice a day but ran out of the medicine about 8 days ago. He's not had any worsening weight gain or swelling nor worsening shortness of breath over the past week. This could be that he's had some improvement in his cardiomyopathy. Nevertheless, I would like him to be on some Lasix  at least until we can determine whether or not he is euvolemic with lab work.  05/27/2016  [name] [name] returns today for follow-up. He's gained about 6 pounds of weight since I last saw him. He denies any worsening shortness of breath or orthostatic dizziness. He saw Dr. Shlomo in August for following of his obstructive sleep apnea. He remains physically active and denies any chest pain. He recently had a repeat echo in October 2017 which showed a small improvement in LV function with EF up to 35-40%. Heart  failure regimen includes aspirin , carvedilol , Crestor , and spironolactone . He is not currently on a ARB is he's had orthostatic symptoms in the past although that his generally resolved. His creatinine is normal.  08/24/2016  I saw [name] [name] today in follow-up. He again gained about 4 pounds over the past month. He felt that the weight gain was heart failure related because he got more short of breath particularly walking up stairs. Based on that he increased his Lasix  back to 40 mg daily. He notes that his urine is been darker and his urination is not necessarily improved. He's also been more dizzy. He says he gets somewhat presyncopal with change in position. Lab work 12 days ago indicated a stable creatinine however. Despite this, I feel that he may be somewhat presyncopal perhaps on too high of a dose of diuretics. He is also on Entresto  24/26 and Aldactone  12.5 mg daily. He also complained of some pain across the back between the shoulder blades. He said this got better with resting and drinking water . It's difficult to say whether that it was the water  or perhaps resting from his activities. I cannot exclude that this could be angina. His EKG however is reassuring today.  09/21/2016  [name] [name] was seen today in follow-up. I decreased his Lasix , however he does not feel any change in his dizziness. There is no evidence of presyncope. He has gained about 4 pounds which I suspect is some fluid retention. Blood pressure is well-controlled today 118/56.  02/26/2017  [name] [name] returns today for follow-up. He had more recent dizziness and it seemed to worsen on Entresto . He discontinued that and feels that he's now much better. Overall he says he feels well. He is not on ACE inhibitor or ARB but remains on carvedilol . Is also on Lasix  40 mg daily. Blood pressure is stable 122/52.  08/10/2017  [name] [name] returns for follow-up.  Overall he seems to be feeling very well.  Denies any chest pain or  worsening shortness of breath.  He gets occasional dizziness but that is much improved.  Of note an EKG was performed again today and there is a suggestion that there may be underlying atrial flutter.  Heart rate was 74 and EKG looks very similar to his prior EKG.  The computer has interpreted a sinus rhythm with first-degree AV block and bifascicular block.  I compared EKG to his previous EKG last August which looks similar however it is distinctly different from the EKG last of February.  He is asymptomatic, but not anticoagulated.  Otherwise, labs reviewed from October indicate total cholesterol 134, HDL 36 LDL 67 and triglycerides 157.  Hemoglobin A1c of 7.6 and serum creatinine 0.9.  08/30/2017  [name] [name]. Loadholt returns today for follow-up of his studies.  He underwent an echocardiogram to evaluate for change in LVEF, but more importantly to rule out atrial flutter.  His EKG was abnormal and suggested possible atrial flutter, however he was asymptomatic.  The echo did not show any evidence of atrial flutter.  LVEF was stable at 40%.  He also underwent a home 48-hour Holter monitor.  This demonstrated sinus rhythm with PACs and PVCs, but no evidence of atrial fibrillation or flutter.  Remains asymptomatic.  His only other complaint today is chronic leg pain.  He was seen by Dr. Court and evaluated with lower extremity arterial Dopplers.  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  A referral to neurosurgery was suggested but not placed.  01/24/2018  [name] [name]. Mees returns today for follow-up.  Overall he says he is doing really well.  He underwent 2 back injections and has had improvement in his back pain and leg pain.  He had an episode in June where he became somewhat dehydrated.  He presented to the ER with orthostatic hypotension.  His Lasix  was cut back to 20 mg daily.  Weight is stayed stable effect is been improved from 252-246.  He says he is actually had enough energy  to play golf recently.  08/01/2018  [name] [name]. Topper seen today in routine follow-up.  He has some occasional dizziness which is persistent.  He says he gets a little lightheaded when he urinates.  He has been off of tamsulosin with no specific benefit and now is taking the medication every third day.  Blood pressure is stable.  Weight is stable.  He denies any chest pain or worsening shortness of breath with exertion.  His last echo showed an EF of 40% which is stable if not slightly improved from an echo in 2017.  Unfortunately he could not tolerate Entresto  and remains on carvedilol .  He endorses NYHA class I-II heart failure symptoms.  Recent lab work showed total cholesterol 151, HDL 39, LDL 79 triglycerides 166.  Hemoglobin A1c of 7.3.  03/30/2019  [name] [name]. Enriques seen today in follow-up.  Overall he is doing well and has no complaints.  He has not recently struggled with any of the dizziness he has had previously.  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  In the past he has had EKG changes that were questionable for possible atrial flutter however monitoring as well as an echocardiogram failed to show any evidence of atrial flutter or fibrillation.  This is now clear finding of atrial fibrillation on EKG with an irregularly irregular rhythm with no evidence of P waves.  He reports being asymptomatic with this.  05/12/2019   [name] [name]. Cho returns today for follow-up of his A. fib.  He reports he has been compliant with Eliquis .  He says he had a couple episodes of loose stools.  He thought it might be due to the medicine but this seems quite atypical.  He stopped some of his stool softener/bulking agents.  Weight is down about 3 pounds.  He increased his Lasix  because he felt like it was a little wheezy.  Possibly could have had some more heart failure because he is in A. fib and  does have a history of heart failure.  EKG again shows atrial  flutter/fibrillation with variable AV response at 74.  Since this is presumably persistent at this point we discussed an elective cardioversion and he is agreeable to this.  06/13/2019  [name] [name]. Macomber was seen today in follow-up.  He underwent cardioversion by myself in November.  Since then he has felt fairly well.  His EKG today shows that he is maintaining sinus rhythm.  Blood pressure was 119/63 however he has had some positional dizziness at home.  His daughter notes he has had some blood pressures with low diastolics less than 50 and he does feel some dizziness.  He also feels some fatigue when exerting himself a lot.  I suspect this could be due to hypotension.  Is possible also that his EF has improved now that he is back in sinus and that he may be over diuresed being on both Lasix  and Aldactone .  09/15/2019  RandallThielman returns for follow-up.  He reports improvement in his orthostatic symptoms after stopping Aldactone  and his PCP also stopped tamsulosin.  He is now on some saw palmetto for BPH.  Unfortunately he does get a little bit more short of breath and I wonder if this is due to need for more diuretic.  His LVEF was 40% last year however has not been reassessed.  I would like to get an echo to recheck that.  03/25/2020  [name] [name]. Mollica is seen today for 34-month follow-up.  He denies any further orthostatic symptoms.  He was not feeling well on Aldactone .  He was also taken off tamsulosin.  He is only on carvedilol  for heart failure and unfortunately his LVEF declined further in March down to 5 to 40%, previously 40 to 45%.  I had increased his Lasix  due to signs of increased LV filling pressure however then he became a little bit prerenal.  His Lasix  was then decreased to 40 mg every morning and 20 mg every afternoon.  Since then he has been asymptomatic with heart failure at most with NYHA class II symptoms.  Is been struggling with issues with the CPAP and that the humidifier broke.  This is a  [employer] device and he was told by the company that they are no longer manufacturing them rather they are making ventilators because of COVID-19.  Lipid profile in June 2021 showed total cholesterol 168, HDL 32, triglycerides 106 and 116.  A1c was 7.7 and both remain above goal.  09/20/2020  [name] [name] returns today for follow-up.  Overall he seems to be doing pretty well.  He struggled with a small wound under his left fifth metatarsal.  He is going to the wound center on April 1.  EF had declined somewhat down to 40 to 45%.  Unfortunately has been intolerant of Entresto  and losartan  more recently causing worsening fatigue.  His options for heart failure medications have been limited.  He is a diabetic with recent A1c at 6.8.  We discussed today the possibility of adding an SGLT2 inhibitor based on positive data in heart failure and diabetes.  01/01/2021  [name] [name] is seen today in follow-up.  He seems somewhat desponded today.  He says he has a lot going on and and unfortunately has been dealing with wound that is nonhealing in his left leg.  He said that even working with the wound center he has developed osteomyelitis.  There talk about possible amputation.  He has been noted to have some mild reduction  in blood flow to that leg.  He is scheduled to actually have a retrograde angiogram tomorrow with Dr. Gretta.  He is also going to see Dr. Luiz with infectious diseases.  Hopefully they will be able to get control of this before having to consider an amputation.  He does say that he feels better though.  He had come off of alfuzosin  which she was taking by his urologist and he now reports being less dizzy.  Also his blood sugars are significantly lower after starting the Jardiance .  08/14/2021  [name] [name] returns today for follow-up.  He seems to be doing much better.  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  He still struggles with some balance issues but is done better.  He had 1 episode where he became dizzy or lightheaded in January when he was playing golf in [place] .  This seemed to resolve with rest and drinking some water .  He has done well with the Jardiance .  11/17/2021  [name] [name]. Sudduth is seen today as an acute add-on for shortness of breath.  He reports several weeks of worsening shortness of breath and difficulty breathing.  This past weekend it was difficult for him to sleep.  He called and noted that he increased his Lasix  from 40 to 60 mg daily.  I advised him to stay on that because of his meeting today.  He reports over the past couple days he has started to diurese a little more.  He is swelling has improved but his breathing is still somewhat labored and he is fatigued.  Blood pressure was normal today.  Oxygen  at saturation 96%.  He had repeat lipids in March, which showed a total cholesterol 172, triglycerides 86, HDL 38 and LDL lower at 118.  EKG today shows normal sinus rhythm.  01/21/2022  [name] [name]. Scicchitano returns today for follow-up.  He is feeling much better after treatment of his heart failure.  He is down about 30 pounds of fluid.  He is currently on twice daily Lasix .  His echo had shown an LVEF of 30 to 35%.  Subsequently underwent heart catheterization by Dr. Wonda on 12/15/2021 which showed severe native coronary artery disease with patent left main and total occlusion of the LAD after the first diagonal.  The LIMA to LAD was patent.  There was interval occlusion of the SVG to PDA and SVG to PLA.  LVEDP was normal.  Given the fact that he had akinesis of the inferior and posterior walls and lateral walls on echo, the decision was made not to consider intervention of the native left circumflex.  We then pursued viability evaluation with cardiac MRI performed on 01/19/2022, this demonstrated severe LV dilatation with severe systolic dysfunction and LVEF  28%.  There was thinning and akinesis of the inferior and lateral walls.  The RV was mildly dilated with mild systolic dysfunction and RV EF 40%.  No LGE was noted however certain severe thinning of the apical and basal lateral wall and apical inferior wall suggested nonviability.  He was noted to have moderate mitral and tricuspid regurgitation.  08/20/2022  [name] [name]. Leece returns today for follow-up.  He underwent CRT-D implantation back in November with Dr. Inocencio.  This was successful and subsequently he reported improvement in his heart failure symptoms consistent with possible responder status.  He was just seen in  follow-up a couple of weeks ago and was felt to be doing well.  He has had some recent weight gain.  Probably 7 to 10 pounds over the past 3 to 4 months.  He reports some lower extremity edema primarily in his ankles.  He says that he is felt much less dizzy after cutting back on his beta-blocker.  10/12/2022  [name] [name]. Harner returns today for follow-up.  About a month ago he called in with concerns about worsening shortness of breath.  He had a device interrogation which showed possible fluid accumulation as well as had noted that he had been out of rhythm since mid March.  He was advised to increase his diuretics which she did and has had improvement in his swelling.  He still feels fatigued.  EKG today shows underlying A-fib with a ventricular paced rhythm and PVCs.  He had repeat labs recently including a metabolic profile and BNP.  BNP is elevated at 323 but improved somewhat from 512 about a month ago.  Creatinine is normal but slightly higher at 1.13.  02/26/2023  [name] [name] is seen today in follow-up.  Since I last saw him he was diagnosed with atrial fibrillation and underwent cardioversion.  He had subsequent A-fib ablation by Dr. Inocencio in July.  Since then he has been doing very well.  He just had a repeat echo which showed mild improvement in LVEF up to 30 to 35%.  He had grade 3  diastolic dysfunction but has not had issues with heart failure symptoms.  Fact is lost 20 pounds since I saw him in April and according to his significant other has lost about 50 pounds over the past year.  He also says that his PCP had noted that he had an abnormal Cologuard test.  He was post to have colonoscopy but this was postponed due to his heart issues.  He is now considering that sooner.  09/03/2023  [name] [name]. Brakebill is seen today in follow-up.  He recently got back from [place]  for the appointment today.  He is scheduled to have an upcoming sigmoidoscopy with Dr. Shaaron.  He is a little concerned because of bleeding issues he had previously.  He reported that he has had some shortness of breath and still gets weakness and fatigue with exertion.  His shortness of breath is actually been improving a little over the last several days.  He had a recent device interrogation which did show a trend toward fluid accumulation at the end of February but seems to be trending in the right direction now.  When I saw him a few months ago he was given Furoscix  for an acute exacerbation of congestive heart failure.  He had several doses and noted marked improvement in his symptoms.  So far his weight is stayed fairly stable.  Main concern is dizziness and difficulty ambulating.  05/18/2024  [name] [name]. Peddie is seen today in follow-up.  Reports he was recently hospitalized and [facility] for acute congestive heart failure.  He presented to [facility] with symptoms of vertigo and was transferred to [facility] which time he was found to be in acute congestive heart failure.  Echo there apparently showed LVEF of 15 to 20%.  He had severe mitral regurgitation moderately dilated left atrium and severe pulmonary hypertension with RVSP of 78 mmHg.  There were large bilateral pleural effusions.  He was diuresed about 15 pounds and switch to Bumex 1 mg twice daily.  Recent device check in November showed  a trend toward  significant reduction in OptiVol suggesting he may be dry.  Weight had declined from March at 219 pounds down to 178 pounds.  He reports he does feel somewhat dry.  Overall though he says he feels much better.  He recently had labs from his primary care provider.  Labs as of November 12 showed normal creatinine at 0.96, hemoglobin A1c 6.7% BUN was elevated at 24 possibly suggesting some dehydration or azotemia.  Cholesterol is excellent with total 137, HDL 44, triglycerides 78 and LDL 77.  He had a normal CBC.  He did have a repeat echo on 11//2025 which showed improvement in LVEF to 30 to 35% which seems stable compared to our prior echo.  The mitral valve was mild to moderately insufficient.  This does seem to be improved compared to when he was in decompensated failure previously.  PMHx:  Past Medical History:  Diagnosis Date   AICD (automatic cardioverter/defibrillator) present    Arthritis    Knee L - probably   Atrial fibrillation (HCC)    BPH (benign prostatic hyperplasia)    CAD (coronary artery disease) 07/06/2011   CHF (congestive heart failure) (HCC)    Coronary artery disease    Diabetes mellitus    Frequency of urination    Heart failure (HCC)    High cholesterol    History of nuclear stress test 09/2010   dipyridamole; mild perfusion defect due to attenuation with mild superimposed ischemia in apical septal, apical, apical inferior & apical lateral regions; rest LV enlarged in size; prominent gut uptake in infero-apical region; no significant ischemia demonstrated; low risk scan    HNP (herniated nucleus pulposus), lumbar    Hypertension    Ischemic cardiomyopathy    LV dysfunction 07/06/2011   Nocturia    Right bundle branch block    S/P CABG (coronary artery bypass graft) 08/03/2011   x3; LIMA to LAD,, SVG to PDA, SVG to posterolateral branch of RCA; Dr. WENDI Fellers   Sleep apnea    sleep study 10/2010- AHI during total sleep 32.1/hr and during REM 62.3/hr (severe sleep  apnea)unable to tolerate c pap   Spinal stenosis of lumbar region    Stroke [facility])    [place]     Past Surgical History:  Procedure Laterality Date   ABDOMINAL AORTOGRAM W/LOWER EXTREMITY Left 11/14/2020   Procedure: ABDOMINAL AORTOGRAM W/LOWER EXTREMITY;  Surgeon: Gretta Lonni PARAS, MD;  Location: [facility] INVASIVE CV LAB;  Service: Cardiovascular;  Laterality: Left;   ABDOMINAL AORTOGRAM W/LOWER EXTREMITY N/A 01/02/2021   Procedure: ABDOMINAL AORTOGRAM W/LOWER EXTREMITY;  Surgeon: Gretta Lonni PARAS, MD;  Location: [facility] INVASIVE CV LAB;  Service: Cardiovascular;  Laterality: N/A;   AMPUTATION Left 03/20/2021   Procedure: Left foot 5th ray amputation;  Surgeon: Kit Rush, MD;  Location: [facility] OR;  Service: Orthopedics;  Laterality: Left;   ATRIAL FIBRILLATION ABLATION N/A 01/19/2023   Procedure: ATRIAL FIBRILLATION ABLATION;  Surgeon: Inocencio Soyla Lunger, MD;  Location: [facility] INVASIVE CV LAB;  Service: Cardiovascular;  Laterality: N/A;   BIOPSY  04/14/2023   Procedure: BIOPSY;  Surgeon: Shaaron Lamar HERO, MD;  Location: [facility];  Service: Endoscopy;;   BIV ICD INSERTION CRT-D N/A 04/30/2022   Procedure: BIV ICD INSERTION CRT-D;  Surgeon: Inocencio Soyla Lunger, MD;  Location: [facility] INVASIVE CV LAB;  Service: Cardiovascular;  Laterality: N/A;   CARDIAC CATHETERIZATION  01/2011   ischemic cardiomyopathy 30-35%, multivessel CAD (Dr. IVAR Sor)    CARDIAC CATHETERIZATION  09/13/2015  Procedure: Right/Left Heart Cath and Coronary/Graft Angiography;  Surgeon: Vinie JAYSON Maxcy, MD;  Location: [facility] INVASIVE CV LAB;  Service: Cardiovascular;;   CARDIOVERSION N/A 05/16/2019   Procedure: CARDIOVERSION;  Surgeon: Maxcy Vinie JAYSON, MD;  Location: [facility] ENDOSCOPY;  Service: Cardiovascular;  Laterality: N/A;   CARDIOVERSION N/A 10/16/2022   Procedure: CARDIOVERSION;  Surgeon: Francyne Headland, MD;  Location: [facility] INVASIVE CV LAB;  Service: Cardiovascular;  Laterality: N/A;   CARDIOVERSION N/A 12/16/2022   Procedure:  CARDIOVERSION;  Surgeon: Kate Lonni CROME, MD;  Location: [facility] INVASIVE CV LAB;  Service: Cardiovascular;  Laterality: N/A;   COLONOSCOPY WITH PROPOFOL  N/A 04/14/2023   Procedure: COLONOSCOPY WITH PROPOFOL ;  Surgeon: Shaaron Lamar HERO, MD;  [facility];  Service: Endoscopy;  Laterality: N/A;  11:00am, asa 3/4   COLONOSCOPY WITH PROPOFOL  N/A 04/18/2023   Procedure: COLONOSCOPY WITH PROPOFOL ;  Surgeon: Eartha Angelia Sieving, MD;  [facility];  Service: Gastroenterology;  Laterality: N/A;   Colonscopy     CORONARY ARTERY BYPASS GRAFT  08/03/2011   Procedure: CORONARY ARTERY BYPASS GRAFTING (CABG);  Surgeon: Dorise MARLA Fellers, MD;  Location: [facility] OR;  Service: Open Heart Surgery;  Laterality: N/A;  CABG times three using right saphenous vein and left mammary artery usisng endoscope.   ESOPHAGEAL BRUSHING  04/14/2023   Procedure: ESOPHAGEAL BRUSHING;  Surgeon: Shaaron Lamar HERO, MD;  [facility];  Service: Endoscopy;;   ESOPHAGOGASTRODUODENOSCOPY (EGD) WITH PROPOFOL  N/A 04/14/2023   Procedure: ESOPHAGOGASTRODUODENOSCOPY (EGD) WITH PROPOFOL ;  Surgeon: Shaaron Lamar HERO, MD;  [facility];  Service: Endoscopy;  Laterality: N/A;   FLEXIBLE SIGMOIDOSCOPY N/A 11/12/2023   Procedure: KINGSTON SIDE;  Surgeon: Shaaron Lamar HERO, MD;  [facility];  Service: Endoscopy;  Laterality: N/A;  230pm, asa 3, has defibrillator   HEMOSTASIS CLIP PLACEMENT  04/14/2023   Procedure: HEMOSTASIS CLIP PLACEMENT;  Surgeon: Shaaron Lamar HERO, MD;  [facility];  Service: Endoscopy;;   LEFT HEART CATH AND CORS/GRAFTS ANGIOGRAPHY N/A 12/15/2021   Procedure: LEFT HEART CATH AND CORS/GRAFTS ANGIOGRAPHY;  Surgeon: Wonda Sharper, MD;  Location: [facility] INVASIVE CV LAB;  Service: Cardiovascular;  Laterality: N/A;   LEFT HEART CATHETERIZATION WITH CORONARY/GRAFT ANGIOGRAM N/A 09/20/2013   Procedure: LEFT HEART CATHETERIZATION WITH EL BILE;  Surgeon:  Vinie KYM Maxcy, MD;  Location: [facility] CATH LAB;  Service: Cardiovascular;  Laterality: N/A;   Lower Extremity Arterial Doppler  03/16/2013   bilat ABIs demonstated normal values; R runoff - posterior tibial & anterior tibial arteries occluded; L runoff - peroneal & posterior tibial arteries occluded, anterior tibial artery appears occluded   LUMBAR LAMINECTOMY/DECOMPRESSION MICRODISCECTOMY N/A 09/12/2014   Procedure: LUMBAR DECOMPRESSION L3-L4, L4-L5, MICRODISCECTOMY L4-L5;  Surgeon: Reyes Billing, MD;  Location: [facility] ORS;  Service: Orthopedics;  Laterality: N/A;   PERIPHERAL VASCULAR BALLOON ANGIOPLASTY  11/14/2020   Procedure: PERIPHERAL VASCULAR BALLOON ANGIOPLASTY;  Surgeon: Gretta Lonni PARAS, MD;  Location: [facility] INVASIVE CV LAB;  Service: Cardiovascular;;   PERIPHERAL VASCULAR BALLOON ANGIOPLASTY Left 01/02/2021   Procedure: PERIPHERAL VASCULAR BALLOON ANGIOPLASTY;  Surgeon: Gretta Lonni PARAS, MD;  Location: [facility] INVASIVE CV LAB;  Service: Cardiovascular;  Laterality: Left;  Failed PTA   Pilonidal Cyst removed     POLYPECTOMY  04/14/2023   Procedure: POLYPECTOMY INTESTINAL;  Surgeon: Shaaron Lamar HERO, MD;  [facility];  Service: Endoscopy;;   SCLEROTHERAPY  04/14/2023   Procedure: MATIAS;  Surgeon: Shaaron Lamar HERO, MD;  [facility];  Service: Endoscopy;;  SUBMUCOSAL LIFTING INJECTION  04/14/2023   Procedure: SUBMUCOSAL LIFTING INJECTION;  Surgeon: Shaaron Lamar HERO, MD;  [facility];  Service: Endoscopy;;   SUBMUCOSAL TATTOO INJECTION  04/14/2023   Procedure: SUBMUCOSAL TATTOO INJECTION;  Surgeon: Shaaron Lamar HERO, MD;  [facility];  Service: Endoscopy;;   TRANSTHORACIC ECHOCARDIOGRAM  11/10/2012   EF 40-45%, mild LVH, mild hypokinesis of anteroseptal myocardium, grade 1 diastolic dysfunction; mild [name] [name] & calcifed mitral annulus; LA mild-mod dilated; RA mildly dilated    FAMHx:  Family History  Problem Relation Age of Onset   Cancer Mother     Lung disease Father        & heart disease   Colon polyps Father    Colon cancer Sister        57s or 35s   Sudden death Maternal Grandmother    Anesthesia problems Daughter     SOCHx:   reports that he quit smoking about 12 years ago. His smoking use included cigars. He quit smokeless tobacco use about 12 years ago. He reports that he does not currently use alcohol  after a past usage of about 1.0 - 2.0 standard drink of alcohol  per week. He reports that he does not use drugs.  ALLERGIES:  Allergies  Allergen Reactions   Entresto  [Sacubitril -Valsartan ] Other (See Comments)    dizziness   Sacubitril  Other (See Comments)     Dizziness    ROS: Pertinent items noted in HPI and remainder of comprehensive ROS otherwise negative.  HOME MEDS: Current Outpatient Medications  Medication Sig Dispense Refill   acetaminophen  (TYLENOL ) 500 MG tablet Take 500-1,000 mg by mouth every 6 (six) hours as needed (for pain.).     apixaban  (ELIQUIS ) 5 MG TABS tablet Take 1 tablet (5 mg total) by mouth 2 (two) times daily.     bumetanide (BUMEX) 0.5 MG tablet Take 2 tablets (1mg ) by mouth in the morning and take 1 tablet (0.5mg ) in the afternoon. 135 tablet 1   carvedilol  (COREG ) 3.125 MG tablet Take 1 tablet by mouth twice daily 180 tablet 3   empagliflozin  (JARDIANCE ) 10 MG TABS tablet Take 1 tablet (10 mg total) by mouth daily before breakfast. 90 tablet 3   gabapentin  (NEURONTIN ) 300 MG capsule Take 600 mg by mouth at bedtime.     glipiZIDE  (GLUCOTROL  XL) 5 MG 24 hr tablet Take 5 mg by mouth daily.     metFORMIN  (GLUCOPHAGE -XR) 500 MG 24 hr tablet Take 1,000 mg by mouth 2 (two) times daily.     ONETOUCH VERIO test strip 1 each daily.     rosuvastatin  (CRESTOR ) 10 MG tablet Take 1 tablet (10 mg total) by mouth every other day. 45 tablet 3   No current facility-administered medications for this visit.    LABS/IMAGING: No results found for this or any previous visit (from the past 48  hours).   No results found.  VITALS: BP 122/70   Pulse 67   Ht 6' 2 (1.88 m)   Wt 181 lb 3.2 oz (82.2 kg)   SpO2 98%   BMI 23.26 kg/m   EXAM: General appearance: alert and no distress Neck: JVD - 5 cm above sternal notch, no carotid bruit, and thyroid  not enlarged, symmetric, no tenderness/mass/nodules Lungs: dullness to percussion bibasilar Heart: regular rate and rhythm Abdomen: soft, non-tender; bowel sounds normal; no masses,  no organomegaly Extremities: edema trace bilateral sock line Pulses: 2+ and symmetric Skin: Skin color, texture, turgor normal. No rashes or lesions  Neurologic: Grossly normal Psych: Pleasant  EKG: EKG Interpretation Date/Time:  Thursday May 18 2024 09:26:31 EST Ventricular Rate:  68 PR Interval:  146 QRS Duration:  150 QT Interval:  462 QTC Calculation: 491 R Axis:   265  Text Interpretation: Atrial-sensed ventricular-paced rhythm When compared with ECG of 20-Sep-2023 11:34, Premature ventricular complexes are no longer Present Confirmed by Mona Kent [phone]) on 05/18/2024 9:37:38 AM    ASSESSMENT: Acute on chronic chronic systolic congestive heart failure, LVEF 28% by cMRI (12/2021) -inferior and lateral wall thinning suggestive of nonviable myocardium, NYHA Class I-II symptoms, s/p Medtronic CRT-D implant (04/2022) Orthostatic hypotension-resolved History of atrial fibrillation, CHADSVASC score of 5, status post DCCV (04/2019), 11/2022 and subsequent ablation (12/2022) Chronic systolic congestive heart failure - EF 40-45%% (2021) -> 30-35% (2024) Coronary artery disease status post three-vessel CABG in 2013 (LIMA to LAD, SVG to PDA and SVG to PLA) -occluded SVG to PDA and SVG to PLA by cath 11/2021 Hypertension-controlled Dyslipidemia Diabetes type 2 - better controlled Mild PVD-with normal ABI Hypotension/upper back pain - resolved Spinal stenosis - s/p surgery Trifascicular block OSA-back on BiPAP  PLAN: 1.   [name] [name]. [place] had  recent acute on chronic systolic congestive heart failure with LVEF down to 15 to 20%.  He was symptomatic with vertigo and had substantial volume overload.  He has subsequently had about 30 pound weight loss with diuretics and says he does feel somewhat dry today.  I advise decreasing his Bumex to 1 mg in the morning and 0.5 mg in the afternoon.  We may be able to decrease it further.  I have also encouraged increasing his fluid intake to about 2 L a day.  Will monitor his volume status with OptiVol and weights.  The etiology of his decline in LVEF is not clear.  He has denied any anginal or ischemic symptoms.  It certainly possible he could have had some progressive coronary disease with his bypass grafts however I would not expect his LVEF to have rebounded suggesting this is likely a nonischemic etiology.  Will schedule close follow-up in about 3 months with APP.  Kent KYM Mona, MD, [facility], FNLA, FACP  [place]  [facility]  Medical Director of the Advanced Lipid Disorders &  Cardiovascular Risk Reduction Clinic Diplomate of the American Board of Clinical Lipidology Attending Cardiologist  Direct Dial: [phone]  Fax: [phone]  Website:  [email or web address] Kent BROCKS Talena Neira 05/18/2024, 10:22 AM

## 2024-05-18 NOTE — Patient Instructions (Addendum)
  Medication Instructions:   BUMEX 0.5mg  tablets - take two tablets (1mg ) in the morning - take one tablet (0.5mg ) in the afternoon  *If you need a refill on your cardiac medications before your next appointment, please call your pharmacy*  Follow-Up: At [facility], you and your health needs are our priority.  As part of our continuing mission to provide you with exceptional heart care, our providers are all part of one team.  This team includes your primary Cardiologist (physician) and Advanced Practice Providers or APPs (Physician Assistants and Nurse Practitioners) who all work together to provide you with the care you need, when you need it.  Your next appointment:    3 months with [name], NP  We recommend signing up for the patient portal called MyChart.  Sign up information is provided on this After Visit Summary.  MyChart is used to connect with patients for Virtual Visits (Telemedicine).  Patients are able to view lab/test results, encounter notes, upcoming appointments, etc.  Non-urgent messages can be sent to your provider as well.   To learn more about what you can do with MyChart, go to [email or web address].   Other Instructions  Aim for less 2L of fluid intake daily or less

## 2024-05-23 DIAGNOSIS — R5383 Other fatigue: Secondary | ICD-10-CM | POA: Diagnosis not present

## 2024-05-23 DIAGNOSIS — R531 Weakness: Secondary | ICD-10-CM | POA: Diagnosis not present

## 2024-05-30 DIAGNOSIS — R531 Weakness: Secondary | ICD-10-CM | POA: Diagnosis not present

## 2024-05-30 DIAGNOSIS — R5383 Other fatigue: Secondary | ICD-10-CM | POA: Diagnosis not present

## 2024-05-31 DIAGNOSIS — M1712 Unilateral primary osteoarthritis, left knee: Secondary | ICD-10-CM | POA: Diagnosis not present

## 2024-06-01 DIAGNOSIS — R5383 Other fatigue: Secondary | ICD-10-CM | POA: Diagnosis not present

## 2024-06-01 DIAGNOSIS — R531 Weakness: Secondary | ICD-10-CM | POA: Diagnosis not present

## 2024-06-05 DIAGNOSIS — R5383 Other fatigue: Secondary | ICD-10-CM | POA: Diagnosis not present

## 2024-06-05 DIAGNOSIS — R531 Weakness: Secondary | ICD-10-CM | POA: Diagnosis not present

## 2024-06-06 ENCOUNTER — Encounter: Payer: Self-pay | Admitting: Internal Medicine

## 2024-06-06 ENCOUNTER — Ambulatory Visit: Admitting: Internal Medicine

## 2024-06-06 VITALS — BP 107/50 | HR 70 | Temp 97.9°F | Ht 74.0 in | Wt 186.2 lb

## 2024-06-06 DIAGNOSIS — Z85038 Personal history of other malignant neoplasm of large intestine: Secondary | ICD-10-CM | POA: Diagnosis not present

## 2024-06-06 DIAGNOSIS — Z08 Encounter for follow-up examination after completed treatment for malignant neoplasm: Secondary | ICD-10-CM | POA: Diagnosis not present

## 2024-06-06 DIAGNOSIS — C187 Malignant neoplasm of sigmoid colon: Secondary | ICD-10-CM

## 2024-06-06 NOTE — Patient Instructions (Signed)
 It was good to see you again today!  I am glad your appetite has come back and you are gaining weight  I would follow through on seeing the hematologist/oncologist as recommended by Dr. Trudy  I suspect her colon cancer was cured when we took the polyp out last year.  We will check a CEA today  As discussed, we will see you back in March of next year when you get back from [place]  and we will assess her candidacy for 1 more colonoscopy.

## 2024-06-06 NOTE — Progress Notes (Signed)
 Gastroenterology Progress Note    Primary Care Physician:  Trudy Vaughn FALCON, MD Primary Gastroenterologist:  Dr. Shaaron  Pre-Procedure History & Physical: HPI:  [name] [name] is a 78 y.o. male here for follow-up.  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  Additional clips require.    He has done well follow-up FS in May demonstrated no residual polyp tissue;  biopsies of site were negative.  CEA has been repeatedly normal at 1.8 and subsequently 1.3.  He is due for 1 1 year surveillance colonoscopy.   We discussed the standard approach of repeat colonoscopy 1 year after diagnosis.  We discussed the utility of doing that with his comorbidities.  He very much wants to have 1 more colonoscopy.  He states he is going to be in [place]  for couple of months and would like to get 1 done next year.  Appetite fell off some last year and he is regained his appetite over the past couple of months and states he is eating very well.  We had him weighed 203 back in May of this year-186 today.  He tells me PCP is referring him to hematology oncology-largely due to weight loss.  He is not having any bleeding or any bowel symptoms; he remains anticoagulated.  He did see Dr. Bernarda Ned who felt sigmoid resection may not be needed and the risk of the procedure with his comorbidities would likely be significant.  He apparently has done well with a conservative course thus far.  I suspect cured with original polyp resection. Past Medical History:  Diagnosis Date   AICD (automatic cardioverter/defibrillator) present    Arthritis    Knee L - probably   Atrial fibrillation (HCC)    BPH (benign prostatic hyperplasia)    CAD (coronary artery disease) 07/06/2011   CHF (congestive heart failure) (HCC)    Coronary artery disease    Diabetes mellitus    Frequency of urination    Heart failure (HCC)     High cholesterol    History of nuclear stress test 09/2010   dipyridamole; mild perfusion defect due to attenuation with mild superimposed ischemia in apical septal, apical, apical inferior & apical lateral regions; rest LV enlarged in size; prominent gut uptake in infero-apical region; no significant ischemia demonstrated; low risk scan    HNP (herniated nucleus pulposus), lumbar    Hypertension    Ischemic cardiomyopathy    LV dysfunction 07/06/2011   Nocturia    Right bundle branch block    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 WENDI Fellers   Sleep apnea    sleep study 10/2010- AHI during total sleep 32.1/hr and during REM 62.3/hr (severe sleep apnea)unable to tolerate c pap   Spinal stenosis of lumbar region    Stroke [name])    [place]     Past Surgical History:  Procedure Laterality Date   ABDOMINAL AORTOGRAM W/LOWER EXTREMITY Left 11/14/2020   Procedure: ABDOMINAL AORTOGRAM W/LOWER EXTREMITY;  Surgeon: Gretta Lonni PARAS, MD;  Location: [facility] INVASIVE CV LAB;  Service: Cardiovascular;  Laterality: Left;   ABDOMINAL AORTOGRAM W/LOWER EXTREMITY N/A 01/02/2021   Procedure: ABDOMINAL AORTOGRAM W/LOWER EXTREMITY;  Surgeon: Gretta Lonni PARAS, MD;  Location: [facility] INVASIVE CV LAB;  Service: Cardiovascular;  Laterality: N/A;   AMPUTATION Left 03/20/2021  Procedure: Left foot 5th ray amputation;  Surgeon: Kit Rush, MD;  Location: [facility] OR;  Service: Orthopedics;  Laterality: Left;   ATRIAL FIBRILLATION ABLATION N/A 01/19/2023   Procedure: ATRIAL FIBRILLATION ABLATION;  Surgeon: Inocencio Soyla Lunger, MD;  Location: [facility] INVASIVE CV LAB;  Service: Cardiovascular;  Laterality: N/A;   BIOPSY  04/14/2023   Procedure: BIOPSY;  Surgeon: Shaaron Lamar HERO, MD;  Location: [facility] ENDO SUITE;  Service: Endoscopy;;   BIV ICD INSERTION CRT-D N/A 04/30/2022   Procedure: BIV ICD INSERTION CRT-D;  Surgeon: Inocencio Soyla Lunger, MD;   Location: [facility] INVASIVE CV LAB;  Service: Cardiovascular;  Laterality: N/A;   CARDIAC CATHETERIZATION  01/2011   ischemic cardiomyopathy 30-35%, multivessel CAD (Dr. IVAR Sor)    CARDIAC CATHETERIZATION  09/13/2015   Procedure: Right/Left Heart Cath and Coronary/Graft Angiography;  Surgeon: Vinie JAYSON Maxcy, MD;  Location: [facility] INVASIVE CV LAB;  Service: Cardiovascular;;   CARDIOVERSION N/A 05/16/2019   Procedure: CARDIOVERSION;  Surgeon: Maxcy Vinie JAYSON, MD;  Location: [facility] ENDOSCOPY;  Service: Cardiovascular;  Laterality: N/A;   CARDIOVERSION N/A 10/16/2022   Procedure: CARDIOVERSION;  Surgeon: Francyne Headland, MD;  Location: [facility] INVASIVE CV LAB;  Service: Cardiovascular;  Laterality: N/A;   CARDIOVERSION N/A 12/16/2022   Procedure: CARDIOVERSION;  Surgeon: Kate Lonni CROME, MD;  Location: [facility] INVASIVE CV LAB;  Service: Cardiovascular;  Laterality: N/A;   COLONOSCOPY WITH PROPOFOL  N/A 04/14/2023   Procedure: COLONOSCOPY WITH PROPOFOL ;  Surgeon: Shaaron Lamar HERO, MD;  Location: [facility];  Service: Endoscopy;  Laterality: N/A;  11:00am, asa 3/4   COLONOSCOPY WITH PROPOFOL  N/A 04/18/2023   Procedure: COLONOSCOPY WITH PROPOFOL ;  Surgeon: Eartha Angelia Sieving, MD;  Location: [facility];  Service: Gastroenterology;  Laterality: N/A;   Colonscopy     CORONARY ARTERY BYPASS GRAFT  08/03/2011   Procedure: CORONARY ARTERY BYPASS GRAFTING (CABG);  Surgeon: Dorise MARLA Fellers, MD;  Location: [facility] OR;  Service: Open Heart Surgery;  Laterality: N/A;  CABG times three using right saphenous vein and left mammary artery usisng endoscope.   ESOPHAGEAL BRUSHING  04/14/2023   Procedure: ESOPHAGEAL BRUSHING;  Surgeon: Shaaron Lamar HERO, MD;  Location: [facility];  Service: Endoscopy;;   ESOPHAGOGASTRODUODENOSCOPY (EGD) WITH PROPOFOL  N/A 04/14/2023   Procedure: ESOPHAGOGASTRODUODENOSCOPY (EGD) WITH PROPOFOL ;  Surgeon: Shaaron Lamar HERO, MD;  Location: [facility];  Service: Endoscopy;  Laterality: N/A;   FLEXIBLE  SIGMOIDOSCOPY N/A 11/12/2023   Procedure: KINGSTON SIDE;  Surgeon: Shaaron Lamar HERO, MD;  Location: [facility];  Service: Endoscopy;  Laterality: N/A;  230pm, asa 3, has defibrillator   HEMOSTASIS CLIP PLACEMENT  04/14/2023   Procedure: HEMOSTASIS CLIP PLACEMENT;  Surgeon: Shaaron Lamar HERO, MD;  [facility];  Service: Endoscopy;;   LEFT HEART CATH AND CORS/GRAFTS ANGIOGRAPHY N/A 12/15/2021   Procedure: LEFT HEART CATH AND CORS/GRAFTS ANGIOGRAPHY;  Surgeon: Wonda Sharper, MD;  Location: [facility] INVASIVE CV LAB;  Service: Cardiovascular;  Laterality: N/A;   LEFT HEART CATHETERIZATION WITH CORONARY/GRAFT ANGIOGRAM N/A 09/20/2013   Procedure: LEFT HEART CATHETERIZATION WITH EL BILE;  Surgeon: Vinie KYM Maxcy, MD;  Location: [facility] CATH LAB;  Service: Cardiovascular;  Laterality: N/A;   Lower Extremity Arterial Doppler  03/16/2013   bilat ABIs demonstated normal values; R runoff - posterior tibial & anterior tibial arteries occluded; L runoff - peroneal & posterior tibial arteries occluded, anterior tibial artery appears occluded   LUMBAR LAMINECTOMY/DECOMPRESSION MICRODISCECTOMY N/A 09/12/2014   Procedure: LUMBAR DECOMPRESSION L3-L4, L4-L5, MICRODISCECTOMY L4-L5;  Surgeon: Reyes  Duwayne, MD;  Location: [facility] ORS;  Service: Orthopedics;  Laterality: N/A;   PERIPHERAL VASCULAR BALLOON ANGIOPLASTY  11/14/2020   Procedure: PERIPHERAL VASCULAR BALLOON ANGIOPLASTY;  Surgeon: Gretta Lonni PARAS, MD;  Location: [facility] INVASIVE CV LAB;  Service: Cardiovascular;;   PERIPHERAL VASCULAR BALLOON ANGIOPLASTY Left 01/02/2021   Procedure: PERIPHERAL VASCULAR BALLOON ANGIOPLASTY;  Surgeon: Gretta Lonni PARAS, MD;  Location: [facility] INVASIVE CV LAB;  Service: Cardiovascular;  Laterality: Left;  Failed PTA   Pilonidal Cyst removed     POLYPECTOMY  04/14/2023   Procedure: POLYPECTOMY INTESTINAL;  Surgeon: Shaaron Lamar HERO, MD;  Location[facility];  Service: Endoscopy;;   SCLEROTHERAPY  04/14/2023    Procedure: MATIAS;  Surgeon: Shaaron Lamar HERO, MD;  [facility]: [facility];  Service: Endoscopy;;   SUBMUCOSAL LIFTING INJECTION  04/14/2023   Procedure: SUBMUCOSAL LIFTING INJECTION;  Surgeon: Shaaron Lamar HERO, MD;  [facility]: [facility];  Service: Endoscopy;;   SUBMUCOSAL TATTOO INJECTION  04/14/2023   Procedure: SUBMUCOSAL TATTOO INJECTION;  Surgeon: Shaaron Lamar HERO, MD;  Location: [facility];  Service: Endoscopy;;   TRANSTHORACIC ECHOCARDIOGRAM  11/10/2012   EF 40-45%, mild LVH, mild hypokinesis of anteroseptal myocardium, grade 1 diastolic dysfunction; mild MR & calcifed mitral annulus; LA mild-mod dilated; RA mildly dilated    Prior to Admission medications   Medication Sig Start Date End Date Taking? Authorizing Provider  acetaminophen  (TYLENOL ) 500 MG tablet Take 500-1,000 mg by mouth every 6 (six) hours as needed (for pain.).   Yes [provider]  apixaban  (ELIQUIS ) 5 MG TABS tablet Take 1 tablet (5 mg total) by mouth 2 (two) times daily. 04/26/23  Yes Akula, Vijaya, MD  bumetanide  (BUMEX ) 0.5 MG tablet Take 2 tablets (1mg ) by mouth in the morning and take 1 tablet (0.5mg ) in the afternoon. 05/18/24  Yes HiltyVinie BROCKS, MD  carvedilol  (COREG ) 3.125 MG tablet Take 1 tablet by mouth twice daily 09/27/23  Yes [name], Vinie BROCKS, MD  empagliflozin  (JARDIANCE ) 10 MG TABS tablet Take 1 tablet (10 mg total) by mouth daily before breakfast. 12/03/23  Yes Devora, Katlyn D, NP  gabapentin  (NEURONTIN ) 300 MG capsule Take 600 mg by mouth at bedtime.   Yes [provider]  metFORMIN  (GLUCOPHAGE -XR) 500 MG 24 hr tablet Take 1,000 mg by mouth 2 (two) times daily. 07/05/15  Yes [provider]  ONETOUCH VERIO test strip 1 each daily. 04/27/23  Yes [provider]  rosuvastatin  (CRESTOR ) 10 MG tablet Take 1 tablet (10 mg total) by mouth every other day. 10/15/23  Yes West, Katlyn D, NP  glipiZIDE  (GLUCOTROL  XL) 5 MG 24 hr tablet Take 5 mg by mouth daily.     [provider]    Allergies as of 06/06/2024 - Review Complete 06/06/2024  Allergen Reaction Noted   Entresto  [sacubitril -valsartan ] Other (See Comments) 02/25/2017   Sacubitril  Other (See Comments) 10/05/2017    Family History  Problem Relation Age of Onset   Cancer Mother    Lung disease Father        & heart disease   Colon polyps Father    Colon cancer Sister        64s or 63s   Sudden death Maternal Grandmother    Anesthesia problems Daughter     Social History   Socioeconomic History   Marital status: Widowed    Spouse name: Not on file   Number of children: 1   Years of education: Not on file   Highest education  level: Not on file  Occupational History   Occupation: real psychologist, occupational  Tobacco Use   Smoking status: Former    Types: Cigars    Quit date: 09/07/2011    Years since quitting: 12.7   Smokeless tobacco: Former    Quit date: 02/28/2012   Tobacco comments:    Former smoker 02/16/23  Vaping Use   Vaping status: Never Used  Substance and Sexual Activity   Alcohol  use: Not Currently    Alcohol /week: 1.0 - 2.0 standard drink of alcohol     Types: 1 - 2 Cans of beer per week   Drug use: No   Sexual activity: Not on file  Other Topics Concern   Not on file  Social History Narrative   ** Merged History Encounter **       Social Drivers of Health   Financial Resource Strain: Not on file  Food Insecurity: No Food Insecurity (04/17/2023)   Hunger Vital Sign    Worried About Running Out of Food in the Last Year: Never true    Ran Out of Food in the Last Year: Never true  Transportation Needs: No Transportation Needs (04/17/2023)   PRAPARE - Administrator, Civil Service (Medical): No    Lack of Transportation (Non-Medical): No  Physical Activity: Unknown (05/05/2023)   Received from [facility]   PCARE Exercise SDOH    Exercise: Active Lifestyle Only;Physical Therapy    PCare Exercise SDOH: Not on file    PCare  Exercise SDOH: Not on file  Stress: Not on file  Social Connections: Not on file  Intimate Partner Violence: Not At Risk (04/17/2023)   Humiliation, Afraid, Rape, and Kick questionnaire    Fear of Current or Ex-Partner: No    Emotionally Abused: No    Physically Abused: No    Sexually Abused: No    Review of Systems   See HPI, otherwise negative ROS  Physical Exam: BP (!) 107/50   Pulse 70   Temp 97.9 F (36.6 C) (Oral)   Ht 6' 2 (1.88 m)   Wt 186 lb 3.2 oz (84.5 kg)   SpO2 98%   BMI 23.91 kg/m  General:   Alert,  Well-developed, well-nourished, pleasant and cooperative in NAD significant cervical adenopathy. Lungs:  Clear throughout to auscultation.   No wheezes, crackles, or rhonchi. No acute distress. Heart:  Regular rate and rhythm; no murmurs, clicks, rubs,  or gallops. Abdomen: Non-distended, normal bowel sounds.  Soft and nontender without appreciable mass or hepatosplenomegaly.  Pulses:  Normal pulses noted. Extremities:  Without clubbing or edema.   Impression/Plan:   Very delightful 78 year old gentleman who had a foci of adenocarcinoma and a sigmoid polyp removed last year.  Polypectomy site bleed requiring additional clips.  He has done well his CEA has remained well within normal limits.  He saw Dr. Debby who felt it may be best to manage him conservatively.  I agree.  So far so good.  Apparently he had some anorexia and weight loss earlier this year but he states he is on the rebound and feels well; going to [place]  for couple of months.  He is desirous of 1 more colonoscopy.  This is not unreasonable so long as he remains a candidate.  PCP is sending him to a hematologist oncologist.  Recommendations:  CEA today  Plan to see him back in in March 2026 to reassess candidacy for 1 more colonoscopy.  Agree with plans for him to  see a hematologist/oncologist.     Notice: This dictation was prepared with Dragon dictation along with smaller phrase  technology. Any transcriptional errors that result from this process are unintentional and may not be corrected upon review.

## 2024-06-07 DIAGNOSIS — M1712 Unilateral primary osteoarthritis, left knee: Secondary | ICD-10-CM | POA: Diagnosis not present

## 2024-06-08 ENCOUNTER — Ambulatory Visit: Payer: Self-pay | Admitting: Internal Medicine

## 2024-06-08 DIAGNOSIS — R5383 Other fatigue: Secondary | ICD-10-CM | POA: Diagnosis not present

## 2024-06-08 DIAGNOSIS — R531 Weakness: Secondary | ICD-10-CM | POA: Diagnosis not present

## 2024-06-08 LAB — CEA: CEA: 2.1 ng/mL (ref 0.0–4.7)

## 2024-06-12 ENCOUNTER — Ambulatory Visit: Attending: Cardiology

## 2024-06-12 DIAGNOSIS — I5022 Chronic systolic (congestive) heart failure: Secondary | ICD-10-CM

## 2024-06-12 DIAGNOSIS — Z95810 Presence of automatic (implantable) cardiac defibrillator: Secondary | ICD-10-CM

## 2024-06-12 NOTE — Progress Notes (Signed)
 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 Summary of oncologic history is as follows:  Diagnosis: Colon adenocarcinoma  -03/04/2023: CT chest: Mildly enlarged right hilar and subcarinal lymph nodes. No suspicious pulmonary nodules. These findings are nonspecific but could be reactive.  Multiple small pulmonary nodules, largest measuring approximately 3 mm. No follow-up needed if patient is low-risk (and has no known or suspected primary neoplasm). -04/14/2023: Colonoscopy and EGD found one 20 x 10 mm polyp at the recto-sigmoid colon.  Pathology: Single fragment of invasive moderately differentiated adenocarcinoma. Other fragments of hyperplastic polyp(s) tubular adenoma(s) without high-grade dysplasia or malignancy. -04/20/2023: CT [facility]: No bowel obstruction, free air. Metallic clips seen along the course of the colon with one at the level of the ileocecal valve in the other at the distal sigmoid colon. Scattered mesenteric haziness. There is also trace ascites and presacral fat stranding. Anasarca. No developing liver mass, lymph node enlargement. No mesenteric lesion. -04/21/2023: CEA 1.8 (normal) -05/04/2023: Patient evaluated by Dr. Debby [surgeon] at [facility] and was felt to not be a candidate for sigmoidectomy due to CHF and severe pulmonary hypertension. Follow-up sigmoidoscopy was recommended after 6 months.  -11/12/2023: Sigmoidoscopy found polypectomy scar at recto-sigmoid colon. Pathology: Negative for dysplasia or malignancy.  -06/07/2024: CEA 2.1 (normal)    History of Presenting Illness: [name] [name] is a 78 y.o. male referred for colon cancer by Trudy Vaughn FALCON, MD. He is accompanied by his significant  other and daughter today.   Patient has a medical history of A-fib, CHF, hypertension, PAD, OSA, Ischemic myocardial dysfunction, and DM type II with neuropathy.   Birdie was referred to me for recent unintentional weight loss with a history of a fragment of invasive moderately differentiated adenocarcinoma at the recto-sigmoid colon in October 2024. Imaging showed no signs of metastatic disease. Follow-up sigmoidoscopy in May 2025 was negative for malignancy. We discussed signatera testing and possible imaging depending on the results.   His appetite is decreased during CHF exacerbation due to fluid accumulation, which is likely attributing to his unintentional weight loss. His weight has stabilized since his CHF has stabilized. He was recently started on Jardiance  for CHF, which his daughter states correlates with weight loss.   He denies any fever, chills, night sweats, blood in stools or melena.   Arnav is going on vacation to [place]  from January 2026 to 08/27/2024.  He has no smoking history and he drinks one beer a week. He is active around the house and lives alone. Mother had tumor in her ear.   Medical History: Past Medical History:  Diagnosis Date   AICD (automatic cardioverter/defibrillator) present    Arthritis    Knee L - probably   Atrial fibrillation (HCC)    BPH (benign prostatic hyperplasia)    CAD (coronary artery disease) 07/06/2011   CHF (congestive heart failure) (HCC)    Coronary artery disease    Diabetes mellitus    Frequency of urination    Heart failure (HCC)    High cholesterol    History of nuclear stress test 09/2010   dipyridamole; mild perfusion defect due to attenuation with mild superimposed ischemia in apical septal, apical, apical inferior & apical lateral regions; rest LV enlarged in size; prominent gut uptake in infero-apical region; no significant  ischemia demonstrated; low risk scan    HNP (herniated nucleus pulposus), lumbar    Hypertension     Ischemic cardiomyopathy    LV dysfunction 07/06/2011   Nocturia    Right bundle branch block    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 WENDI Fellers   Sleep apnea    sleep study 10/2010- AHI during total sleep 32.1/hr and during REM 62.3/hr (severe sleep apnea)unable to tolerate c pap   Spinal stenosis of lumbar region    Stroke [facility])    [place]     Surgical history: Past Surgical History:  Procedure Laterality Date   ABDOMINAL AORTOGRAM W/LOWER EXTREMITY Left 11/14/2020   Procedure: ABDOMINAL AORTOGRAM W/LOWER EXTREMITY;  Surgeon: Gretta Lonni PARAS, MD;  Location: [facility] INVASIVE CV LAB;  Service: Cardiovascular;  Laterality: Left;   ABDOMINAL AORTOGRAM W/LOWER EXTREMITY N/A 01/02/2021   Procedure: ABDOMINAL AORTOGRAM W/LOWER EXTREMITY;  Surgeon: Gretta Lonni PARAS, MD;  Location: [facility] INVASIVE CV LAB;  Service: Cardiovascular;  Laterality: N/A;   AMPUTATION Left 03/20/2021   Procedure: Left foot 5th ray amputation;  Surgeon: Kit Rush, MD;  Location: [facility] OR;  Service: Orthopedics;  Laterality: Left;   ATRIAL FIBRILLATION ABLATION N/A 01/19/2023   Procedure: ATRIAL FIBRILLATION ABLATION;  Surgeon: Inocencio Soyla Lunger, MD;  Location: [facility] INVASIVE CV LAB;  Service: Cardiovascular;  Laterality: N/A;   BIOPSY  04/14/2023   Procedure: BIOPSY;  Surgeon: Shaaron Lamar HERO, MD;  Location: [facility] ENDO SUITE;  Service: Endoscopy;;   BIV ICD INSERTION CRT-D N/A 04/30/2022   Procedure: BIV ICD INSERTION CRT-D;  Surgeon: Inocencio Soyla Lunger, MD;  Location: [facility] INVASIVE CV LAB;  Service: Cardiovascular;  Laterality: N/A;   CARDIAC CATHETERIZATION  01/2011   ischemic cardiomyopathy 30-35%, multivessel CAD (Dr. IVAR Sor)    CARDIAC CATHETERIZATION  09/13/2015   Procedure: Right/Left Heart Cath and Coronary/Graft Angiography;  Surgeon: Vinie JAYSON Maxcy, MD;  Location: [facility] INVASIVE CV LAB;  Service: Cardiovascular;;   CARDIOVERSION  N/A 05/16/2019   Procedure: CARDIOVERSION;  Surgeon: Maxcy Vinie JAYSON, MD;  Location: [facility] ENDOSCOPY;  Service: Cardiovascular;  Laterality: N/A;   CARDIOVERSION N/A 10/16/2022   Procedure: CARDIOVERSION;  Surgeon: Francyne Headland, MD;  Location: [facility] INVASIVE CV LAB;  Service: Cardiovascular;  Laterality: N/A;   CARDIOVERSION N/A 12/16/2022   Procedure: CARDIOVERSION;  Surgeon: Kate Lonni CROME, MD;  Location: [facility] INVASIVE CV LAB;  Service: Cardiovascular;  Laterality: N/A;   COLONOSCOPY WITH PROPOFOL  N/A 04/14/2023   Procedure: COLONOSCOPY WITH PROPOFOL ;  Surgeon: Shaaron Lamar HERO, MD;  Location[facility];  Service: Endoscopy;  Laterality: N/A;  11:00am, asa 3/4   COLONOSCOPY WITH PROPOFOL  N/A 04/18/2023   Procedure: COLONOSCOPY WITH PROPOFOL ;  Surgeon: Eartha Angelia Sieving, MD;  Location: [facility] ENDO [facility];  Service: Gastroenterology;  Laterality: N/A;   Colonscopy     CORONARY ARTERY BYPASS GRAFT  08/03/2011   Procedure: CORONARY ARTERY BYPASS GRAFTING (CABG);  Surgeon: Dorise MARLA Fellers, MD;  Location: [facility] OR;  Service: Open Heart Surgery;  Laterality: N/A;  CABG times three using right saphenous vein and left mammary artery usisng endoscope.   ESOPHAGEAL BRUSHING  04/14/2023   Procedure: ESOPHAGEAL BRUSHING;  Surgeon: Shaaron Lamar HERO, MD;  [facility];  Service: Endoscopy;;   ESOPHAGOGASTRODUODENOSCOPY (EGD) WITH PROPOFOL  N/A 04/14/2023   Procedure: ESOPHAGOGASTRODUODENOSCOPY (EGD) WITH PROPOFOL ;  Surgeon: Shaaron Lamar HERO, MD;  [facility];  Service: Endoscopy;  Laterality: N/A;   FLEXIBLE SIGMOIDOSCOPY N/A 11/12/2023   Procedure: KINGSTON SIDE;  Surgeon: Shaaron Lamar HERO, MD;  [facility];  Service: Endoscopy;  Laterality: N/A;  230pm, asa 3, has defibrillator   HEMOSTASIS CLIP PLACEMENT  04/14/2023   Procedure: HEMOSTASIS CLIP PLACEMENT;  Surgeon: Shaaron Lamar HERO, MD;  [facility];  Service: Endoscopy;;   LEFT HEART CATH AND  CORS/GRAFTS ANGIOGRAPHY N/A 12/15/2021   Procedure: LEFT HEART CATH AND CORS/GRAFTS ANGIOGRAPHY;  Surgeon: Wonda Sharper, MD;  Location: [facility] INVASIVE CV LAB;  Service: Cardiovascular;  Laterality: N/A;   LEFT HEART CATHETERIZATION WITH CORONARY/GRAFT ANGIOGRAM N/A 09/20/2013   Procedure: LEFT HEART CATHETERIZATION WITH EL BILE;  Surgeon: Vinie KYM Maxcy, MD;  Location: [facility] CATH LAB;  Service: Cardiovascular;  Laterality: N/A;   Lower Extremity Arterial Doppler  03/16/2013   bilat ABIs demonstated normal values; R runoff - posterior tibial & anterior tibial arteries occluded; L runoff - peroneal & posterior tibial arteries occluded, anterior tibial artery appears occluded   LUMBAR LAMINECTOMY/DECOMPRESSION MICRODISCECTOMY N/A 09/12/2014   Procedure: LUMBAR DECOMPRESSION L3-L4, L4-L5, MICRODISCECTOMY L4-L5;  Surgeon: Reyes Billing, MD;  Location: [facility] ORS;  Service: Orthopedics;  Laterality: N/A;   PERIPHERAL VASCULAR BALLOON ANGIOPLASTY  11/14/2020   Procedure: PERIPHERAL VASCULAR BALLOON ANGIOPLASTY;  Surgeon: Gretta Lonni PARAS, MD;  Location: [facility] INVASIVE CV LAB;  Service: Cardiovascular;;   PERIPHERAL VASCULAR BALLOON ANGIOPLASTY Left 01/02/2021   Procedure: PERIPHERAL VASCULAR BALLOON ANGIOPLASTY;  Surgeon: Gretta Lonni PARAS, MD;  Location: [facility] INVASIVE CV LAB;  Service: Cardiovascular;  Laterality: Left;  Failed PTA   Pilonidal Cyst removed     POLYPECTOMY  04/14/2023   Procedure: POLYPECTOMY INTESTINAL;  Surgeon: Shaaron Lamar HERO, MD;  [facility];  Service: Endoscopy;;   SCLEROTHERAPY  04/14/2023   Procedure: MATIAS;  Surgeon: Shaaron Lamar HERO, MD;  [facility];  Service: Endoscopy;;   SUBMUCOSAL LIFTING INJECTION  04/14/2023   Procedure: SUBMUCOSAL LIFTING INJECTION;  Surgeon: Shaaron Lamar HERO, MD;  [facility];  Service: Endoscopy;;   SUBMUCOSAL TATTOO INJECTION  04/14/2023   Procedure: SUBMUCOSAL TATTOO INJECTION;  Surgeon: Shaaron Lamar HERO, MD;  [facility];  Service: Endoscopy;;   TRANSTHORACIC ECHOCARDIOGRAM  11/10/2012   EF 40-45%, mild LVH, mild hypokinesis of anteroseptal myocardium, grade 1 diastolic dysfunction; mild MR & calcifed mitral annulus; LA mild-mod dilated; RA mildly dilated     Allergies:  is allergic to entresto  [sacubitril -valsartan ] and sacubitril .  Medications:  Current Outpatient Medications  Medication Sig Dispense Refill   acetaminophen  (TYLENOL ) 500 MG tablet Take 500-1,000 mg by mouth every 6 (six) hours as needed (for pain.).     apixaban  (ELIQUIS ) 5 MG TABS tablet Take 1 tablet (5 mg total) by mouth 2 (two) times daily.     bumetanide  (BUMEX ) 0.5 MG tablet Take 2 tablets (1mg ) by mouth in the morning and take 1 tablet (0.5mg ) in the afternoon. 135 tablet 1   carvedilol  (COREG ) 3.125 MG tablet Take 1 tablet by mouth twice daily 180 tablet 3   empagliflozin  (JARDIANCE ) 10 MG TABS tablet Take 1 tablet (10 mg total) by mouth daily before breakfast. 90 tablet 3   gabapentin  (NEURONTIN ) 300 MG capsule Take 600 mg by mouth at bedtime.     metFORMIN  (GLUCOPHAGE -XR) 500 MG 24 hr tablet Take 1,000 mg by mouth 2 (two) times daily.     ONETOUCH VERIO test strip 1 each daily.     rosuvastatin  (CRESTOR ) 10  MG tablet Take 1 tablet (10 mg total) by mouth every other day. 45 tablet 3   glipiZIDE  (GLUCOTROL  XL) 5 MG 24 hr tablet Take 5 mg by mouth daily. (Patient not taking: Reported on 06/13/2024)     No current facility-administered medications for this visit.     Physical Examination: ECOG PERFORMANCE STATUS: 1 - Symptomatic but completely ambulatory  Vitals:   06/13/24 1122  BP: (!) 110/52  Pulse: 62  Resp: 17  Temp: 97.7 F (36.5 C)  SpO2: 100%   Filed Weights   06/13/24 1122  Weight: 183 lb 3.2 oz (83.1 kg)    GENERAL:alert, no distress and comfortable SKIN: skin color, texture, turgor are normal, no rashes or significant lesions EYES: normal, conjunctiva are pink and  non-injected, sclera clear LYMPH:  no palpable lymphadenopathy in the cervical, axillary or inguinal LUNGS: clear to auscultation and percussion with normal breathing effort HEART: regular rate & rhythm and no murmurs and no lower extremity edema ABDOMEN:abdomen soft, non-tender and normal bowel sounds Musculoskeletal:no cyanosis of digits and no clubbing  PSYCH: alert & oriented x 3 with fluent speech   Laboratory Data: I have reviewed the data as listed Lab Results  Component Value Date   WBC 4.3 12/03/2023   HGB 13.0 12/03/2023   HCT 40.7 12/03/2023   MCV 87 12/03/2023   PLT 147 (L) 12/03/2023     Chemistry      Component Value Date/Time   NA 143 02/15/2024 1307   K 4.4 02/15/2024 1307   CL 104 02/15/2024 1307   CO2 22 02/15/2024 1307   BUN 21 02/15/2024 1307   CREATININE 1.09 02/15/2024 1307   CREATININE 1.07 03/12/2021 1002      Component Value Date/Time   CALCIUM  8.8 02/15/2024 1307   ALKPHOS 100 04/17/2023 1454   AST 14 (L) 04/17/2023 1454   ALT 22 04/17/2023 1454   BILITOT 1.1 04/17/2023 1454   BILITOT 1.2 11/17/2021 1622       Radiographic Studies: I have personally reviewed the radiological report below  CUP PACEART REMOTE DEVICE CHECK ICD: Scheduled remote reviewed. Normal device function.  Presenting rhythm: AS/BiVP Abnormal HF daignostics this monitoring period Next remote transmission per protocol.  ML, CVRS ECHOCARDIOGRAM COMPLETE    ECHOCARDIOGRAM REPORT       Patient Name:   SHIVEN JUNIOUS Date of Exam: 05/02/2024 Medical Rec #:  [id number]      Height:       73.0 in Accession #:    [id number]     Weight:       201.0 lb Date of Birth:  [birth year]     BSA:          2.156 m Patient Age:    78 years       BP:           94/54 mmHg Patient Gender: M              HR:           76 bpm. Exam Location:  [facility]  Procedure: 2D Echo, 3D Echo, Cardiac Doppler, Color Doppler and Intracardiac            Opacification Agent (Both Spectral and Color  Flow Doppler were            utilized during procedure).  Indications:    Chronic systolic heart failure (HCC) [428.22.ICD-9-CM]   History:        Patient has prior history of Echocardiogram  examinations, most                 recent 02/19/2023. Cardiomyopathy and CHF, CAD, Prior CABG and                 Defibrillator, Stroke, Arrythmias:Atrial Fibrillation; Risk                 Factors:Diabetes, Hypertension and Dyslipidemia.   Sonographer:    Aida Pizza RCS Referring Phys: [id number] KATLYN D WEST  IMPRESSIONS   1. Left ventricular ejection fraction, by estimation, is 30 to 35%. Left ventricular ejection fraction by 3D volume is 33 %. The left ventricle has moderately decreased function. The left ventricle demonstrates regional wall motion abnormalities (see  scoring diagram/findings for description). The left ventricular internal cavity size was mildly to moderately dilated. There is moderate asymmetric left ventricular hypertrophy of the basal segment. Left ventricular diastolic parameters are  indeterminate.  2. No formed LV mural thrombus evident by Definity  contrast.  3. Right ventricular systolic function is low normal. The right ventricular size is mildly enlarged. There is moderately elevated pulmonary artery systolic pressure. The estimated right ventricular systolic pressure is 59.9 mmHg.  4. Left atrial size was mildly dilated.  5. Right atrial size was mildly dilated.  6. Left pleural effusion.  7. The mitral valve is degenerative. Mild to moderate mitral valve regurgitation.  8. Tricuspid valve regurgitation is moderate.  9. The aortic valve is tricuspid. Aortic valve regurgitation is not visualized. 10. The inferior vena cava is dilated in size with <50% respiratory variability, suggesting right atrial pressure of 15 mmHg.  Comparison(s): Prior images reviewed side by side. LVEF 30-35%. Moderately to severely elevated estimated RVSP 60 mmHg. Mild to moderate mitral and  moderate tricuspid regurgitation.  FINDINGS  Left Ventricle: Left ventricular ejection fraction, by estimation, is 30 to 35%. Left ventricular ejection fraction by 3D volume is 33 %. The left ventricle has moderately decreased function. The left ventricle demonstrates regional wall motion  abnormalities. Definity  contrast agent was given IV to delineate the left ventricular endocardial borders. The left ventricular internal cavity size was mildly to moderately dilated. There is moderate asymmetric left ventricular hypertrophy of the basal  segment. Left ventricular diastolic function could not be evaluated due to atrial fibrillation. Left ventricular diastolic parameters are indeterminate.    LV Wall Scoring: The antero-lateral wall, entire inferior wall, posterior wall, and basal inferoseptal segment are akinetic. The entire anterior wall, entire anterior septum, apical lateral segment, mid inferoseptal segment, and apex are hypokinetic.  Right Ventricle: The right ventricular size is mildly enlarged. No increase in right ventricular wall thickness. Right ventricular systolic function is low normal. There is moderately elevated pulmonary artery systolic pressure. The tricuspid regurgitant  velocity is 3.35 m/s, and with an assumed right atrial pressure of 15 mmHg, the estimated right ventricular systolic pressure is 59.9 mmHg.  Left Atrium: Left atrial size was mildly dilated.  Right Atrium: Right atrial size was mildly dilated.  Pericardium: Left pleural effusion. There is no evidence of pericardial effusion.  Mitral Valve: The mitral valve is degenerative in appearance. There is mild thickening of the mitral valve leaflet(s). Mild to moderate mitral valve regurgitation, with posteriorly-directed jet.  Tricuspid Valve: The tricuspid valve is grossly normal. Tricuspid valve regurgitation is moderate.  Aortic Valve: The aortic valve is tricuspid. There is mild to moderate aortic valve  annular calcification. Aortic valve regurgitation is not visualized.  Pulmonic Valve: The pulmonic valve was grossly normal. Pulmonic  valve regurgitation is mild.  Aorta: The aortic root is normal in size and structure.  Venous: The inferior vena cava is dilated in size with less than 50% respiratory variability, suggesting right atrial pressure of 15 mmHg.  IAS/Shunts: No atrial level shunt detected by color flow Doppler.  Additional Comments: 3D was performed not requiring image post processing on an independent workstation and was abnormal.    LEFT VENTRICLE PLAX 2D LVIDd:         6.00 cm         Diastology LVIDs:         5.40 cm         LV e' medial:    3.73 cm/s LV PW:         0.80 cm         LV E/e' medial:  27.9 LV IVS:        1.30 cm         LV e' lateral:   6.09 cm/s LVOT diam:     2.30 cm         LV E/e' lateral: 17.1 LV SV:         59 LV SV Index:   27 LVOT Area:     4.15 cm        3D Volume EF                                LV 3D EF:    Left                                             ventricul LV Volumes (MOD)                            ar LV vol d, MOD    156.0 ml                   ejection A2C:                                        fraction LV vol s, MOD    119.0 ml                   by 3D A2C:                                        volume is LV SV MOD A2C:   37.0 ml                    33 %.                                  3D Volume EF:                                3D EF:        33 %  LV EDV:       257 ml                                LV ESV:       172 ml                                LV SV:        85 ml  RIGHT VENTRICLE RV S prime:     8.05 cm/s TAPSE (M-mode): 1.6 cm  LEFT ATRIUM             Index        RIGHT ATRIUM           Index LA diam:        4.20 cm 1.95 cm/m   RA Area:     23.40 cm LA Vol (A2C):   91.2 ml 42.30 ml/m  RA Volume:   74.50 ml  34.55 ml/m LA Vol (A4C):   61.6 ml 28.57 ml/m LA Biplane Vol: 75.5  ml 35.02 ml/m  AORTIC VALVE LVOT Vmax:   59.90 cm/s LVOT Vmean:  44.000 cm/s LVOT VTI:    0.142 m   AORTA Ao Root diam: 3.60 cm  MITRAL VALVE                  TRICUSPID VALVE MV Area (PHT): 5.27 cm       TR Peak grad:   44.9 mmHg MV Decel Time: 144 msec       TR Vmax:        335.00 cm/s MR Peak grad:    77.1 mmHg MR Mean grad:    45.0 mmHg    SHUNTS MR Vmax:         439.00 cm/s  Systemic VTI:  0.14 m MR Vmean:        316.0 cm/s   Systemic Diam: 2.30 cm MR PISA:         1.01 cm MR PISA Eff ROA: 5 mm MR PISA Radius:  0.40 cm MV E velocity: 104.00 cm/s  Jayson Sierras MD Electronically signed by Jayson Sierras MD Signature Date/Time: 05/02/2024/1:14:09 PM      Final      ASSESSMENT & PLAN:  Patient is a 78 y.o. male presenting for colon adenocarcinoma  Assessment and Plan  Colon adenocarcinoma A fragment of colon carcinoma found on colonoscopy in 2024 Surgery deferred for poor surgical candidacy Repeat flexible sigmoidoscopy with no evidence of carcinoma Normal CEA  -Discussed obtaining Ct-DNA testing to assess for residual carcinoma. If positive, will obtain CT CAP -Will follow up on MMR testing -Continue to follow with GI  Weight loss Likely secondary to CHF and loss of appetite Stabilized at this time.  -Continue to monitor.    Orders Placed This Encounter  Procedures   Signatera    Select as applicable. If patient is on or planning to receive immunotherapy, select drug: Not on Immunotherapy If Other or Multiple, Write down drug name:  Do not Delete Below This Line   ==========Department Information========== ID: [id number] Department:[facility] - A DEPT OF [facility] [address] [place] [address] Dept: [phone] Dept Fax: [phone]    Standing Status:   Standing    Number of Occurrences:   5  Expiration Date:   06/13/2025    Jennell to follow up with patient  for sample collection (mobile phleb, lab, or saliva)::   No    Surveillance Program draw frequency::   Every 3 Months    Surveillance Program draw count::   4    Cancer type::   Colon    Stage of diagnosis::   I    History of recurrence?:   No    Current disease status::   No evidence of disease    Date of surgery (MM/DD/YYYY)::   04/14/2023    Is the patient receiving or planning to receive immunotherapy?:   No    Patient status::   Outpatient    Most recent progress/clinical note attached?:   Yes    Pathology report attached?:   Yes    Pathology institution name::   [facility]    Pathology institution address and fax number::   [phone]    Tissue collection date (MM/DD/YYYY)::   04/14/2023    By placing this electronic order I confirm the testing ordered herein is medically necessary and this patient has been informed of the details of the genetic test(s) ordered, including the risks, benefits, and alternatives, and has consented to testing.:   No    What type of billing?:   Automatic Data    Release to patient:   Immediate [1]    The total time spent in the appointment was 40 minutes encounter with patients including review of chart and various tests results, discussions about plan of care and coordination of care plan   All questions were answered. The patient knows to call the clinic with any problems, questions or concerns. No barriers to learning was detected.   LILLETTE Verneta SAUNDERS Teague,acting as a neurosurgeon for Mickiel Dry, MD.,have documented all relevant documentation on the behalf of Mickiel Dry, MD,as directed by  Mickiel Dry, MD while in the presence of Mickiel Dry, MD.  I, Mickiel Dry MD, have reviewed the above documentation for accuracy and completeness, and I agree with the above.    Mickiel Dry, MD 12/16/20259:59 PM

## 2024-06-13 ENCOUNTER — Inpatient Hospital Stay

## 2024-06-13 ENCOUNTER — Inpatient Hospital Stay: Attending: Oncology | Admitting: Oncology

## 2024-06-13 VITALS — BP 110/52 | HR 62 | Temp 97.7°F | Resp 17 | Ht 74.0 in | Wt 183.2 lb

## 2024-06-13 DIAGNOSIS — Z85038 Personal history of other malignant neoplasm of large intestine: Secondary | ICD-10-CM | POA: Insufficient documentation

## 2024-06-13 DIAGNOSIS — R634 Abnormal weight loss: Secondary | ICD-10-CM | POA: Diagnosis not present

## 2024-06-13 DIAGNOSIS — C189 Malignant neoplasm of colon, unspecified: Secondary | ICD-10-CM

## 2024-06-13 NOTE — Patient Instructions (Signed)
 [facility]  Discharge Instructions  You were seen and examined today by Dr. Davonna. Dr. Davonna is a medical oncologist, meaning that she specializes in the treatment of cancer diagnoses. Dr. Davonna discussed your past medical history, family history of cancers, and the events that led to you being here today.  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 The tissue around that polyp was negative for cancer.  Dr. Davonna has recommended lab work today that tests for tumor free cancer DNA in the blood stream. This test takes 4-6 weeks to result, Dr. Davonna will arrange a telephone visit to discuss those results.  Follow-up as scheduled.  Thank you for choosing [facility] to provide your oncology and hematology care.   To afford each patient quality time with our provider, please arrive at least 15 minutes before your scheduled appointment time. You may need to reschedule your appointment if you arrive late (10 or more minutes). Arriving late affects you and other patients whose appointments are after yours.  Also, if you miss three or more appointments without notifying the office, you may be dismissed from the clinic at the providers discretion.    Again, thank you for choosing [facility].  Our hope is that these requests will decrease the amount of time that you wait before being seen by our physicians.   If you have a lab appointment with the Cancer Center - please note that after April 8th, all labs will be drawn in the cancer center.  You do not have to check in or register with the main entrance as you have in the past but will complete your check-in at the cancer center.            _____________________________________________________________  Should you have questions after your visit to [facility], please contact our office at [phone] and follow the prompts.   Our office hours are 8:00 a.m. to 4:30 p.m. Monday - Thursday and 8:00 a.m. to 2:30 p.m. Friday.  Please note that voicemails left after 4:00 p.m. may not be returned until the following business day.  We are closed weekends and all major holidays.  You do have access to a nurse 24-7, just call the main number to the clinic [phone] and do not press any options, hold on the line and a nurse will answer the phone.    For prescription refill requests, have your pharmacy contact our office and allow 72 hours.    Masks are no longer required in the cancer centers. If you would like for your care team to wear a mask while they are taking care of you, please let them know. You may have one support person who is at least 80 years old accompany you for your appointments.

## 2024-06-16 NOTE — Progress Notes (Signed)
 EPIC Encounter for ICM Monitoring  Patient Name: [name] [name] is a 78 y.o. male Date: 06/16/2024 Primary Care Physican: Trudy Vaughn FALCON, MD Primary Cardiologist: [facility]        Electrophysiologist: Inocencio Bi-V Pacing: 96.9%         09/21/2022 Weight: 213 lbs 10/06/2022 Weight: 219 lbs  11/17/2022 Weight: 212.5 lbs 12/22/2022 Weight: 211 lbs 12/31/024 Weight: 207.5 lbs 08/04/2023 Weight: 213 lbs 10/21/2023 Weight: 200 lbs 12/15/2023 Weight: 200 lbs 8/8//2025 Weight: 192 lbs 04/23/2024 Hospital Weight: 185 lbs 05/05/2024 Weight: 177 lbs 05/09/2024 Weight: 178 lbs 06/16/2024 Weight: 180 lbs   Clinical Status Since 09-May-2024 Time in AT/AF  0.0 hr/day (0.0%)                                              Attempted call to patient and unable to reach.   Transmission results reviewed.     Since 05/09/2024 ICM Remote Transmission: Optivol thoracic impedance suggesting normal fluid levels since 05/08/2024.     Prescribed:  Bumetanide  1 mg take 2 tablets by mouth in the morning and 1 tablet (.5 mg total) in the afternoon.    Labs: 04/23/2024 Creatinine 0.97, BUN 15, Potassium 3.6, Sodium 142, GFR 78.9 (per Ezzard Candance Thresa Bernardino in [place] hospital note) 04/21/2024 Creatinine 1.07, BUN 17, Potassium 3.5, Sodium 141, GFR 70.1 (per Ezzard Candance Thresa Bernardino in [place] hospital note) 04/20/2024 Creatinine 1.14, BUN 17, Potassium 3.5, Sodium 142, GFR 65.0 (per Ezzard Candance Thresa Bernardino in [place] hospital note) 12/03/2023 Creatinine 1.08, BUN 21, Potassium 4.2, Sodium 141, GFR 70 10/20/2023 Creatinine 1.25, BUN 20, Potassium 4.6, Sodium 141, GFR 59 10/04/2023 Creatinine 1.33, BUN 21, Potassium 4.2, Sodium 138, GFR 54, BNP 5,155  09/28/2023 Creatinine 1.17, BUN 23, Potassium 4.2, Sodium 140, GFR 63, BNP 5,442 A complete set of results can be found in Results Review.   Recommendations: No changes and encouraged to call if experiencing any fluid symptoms.   Follow-up plan: ICM clinic  phone appointment on 07/24/2024.   91 day device clinic remote transmission 07/31/2024.     EP/Cardiology Office Visits:    Recall 09/22/2024 with Dr Inocencio.      Copy of ICM check sent to Dr. Inocencio.     Remote monitoring is medically necessary for Heart Failure Management.    Daily Thoracic Impedance ICM trend: 03/13/2024 through 06/12/2024.    12-14 Month Thoracic Impedance ICM trend:     Mitzie GORMAN Garner, RN 06/16/2024 2:45 PM

## 2024-06-26 ENCOUNTER — Encounter: Payer: Self-pay | Admitting: *Deleted

## 2024-07-22 LAB — SIGNATERA
SIGNATERA MTM READOUT: 0 MTM/ml
SIGNATERA TEST RESULT: NEGATIVE

## 2024-07-24 ENCOUNTER — Ambulatory Visit: Attending: Cardiology

## 2024-07-24 DIAGNOSIS — Z95810 Presence of automatic (implantable) cardiac defibrillator: Secondary | ICD-10-CM

## 2024-07-24 DIAGNOSIS — I5022 Chronic systolic (congestive) heart failure: Secondary | ICD-10-CM | POA: Diagnosis not present

## 2024-07-25 ENCOUNTER — Encounter (HOSPITAL_COMMUNITY): Payer: Self-pay

## 2024-07-26 NOTE — Progress Notes (Signed)
 EPIC Encounter for ICM Monitoring  Patient Name: [name] [name] is a 79 y.o. male Date: 07/26/2024 Primary Care Physican: Trudy Vaughn FALCON, MD Primary Cardiologist: [facility]        Electrophysiologist: Inocencio Bi-V Pacing: 98.2%         09/21/2022 Weight: 213 lbs 10/06/2022 Weight: 219 lbs  11/17/2022 Weight: 212.5 lbs 12/22/2022 Weight: 211 lbs 12/31/024 Weight: 207.5 lbs 08/04/2023 Weight: 213 lbs 10/21/2023 Weight: 200 lbs 12/15/2023 Weight: 200 lbs 8/8//2025 Weight: 192 lbs 04/23/2024 Hospital Weight: 185 lbs 05/05/2024 Weight: 177 lbs 05/09/2024 Weight: 178 lbs 06/16/2024 Weight: 180 lbs   Since 12-Jun-2024 Time in AT/AF  0.0 hr/day (0.0%)                                              Spoke with patient and heart failure questions reviewed.  Transmission results reviewed.  Pt asymptomatic for fluid accumulation.  Reports feeling well at this time and voices no complaints.     Since 06/12/2024 ICM Remote Transmission: Optivol thoracic impedance suggesting normal fluid levels since 05/08/2024.     Prescribed:  Bumetanide  1 mg take 2 tablets by mouth in the morning and 1 tablet (.5 mg total) in the afternoon.    Labs: 04/23/2024 Creatinine 0.97, BUN 15, Potassium 3.6, Sodium 142, GFR 78.9 (per Ezzard Candance Thresa Bernardino in [place] hospital note) 04/21/2024 Creatinine 1.07, BUN 17, Potassium 3.5, Sodium 141, GFR 70.1 (per Ezzard Candance Thresa Bernardino in [place] hospital note) 04/20/2024 Creatinine 1.14, BUN 17, Potassium 3.5, Sodium 142, GFR 65.0 (per Ezzard Candance Thresa Bernardino in [place] hospital note) 12/03/2023 Creatinine 1.08, BUN 21, Potassium 4.2, Sodium 141, GFR 70 10/20/2023 Creatinine 1.25, BUN 20, Potassium 4.6, Sodium 141, GFR 59 10/04/2023 Creatinine 1.33, BUN 21, Potassium 4.2, Sodium 138, GFR 54, BNP 5,155  09/28/2023 Creatinine 1.17, BUN 23, Potassium 4.2, Sodium 140, GFR 63, BNP 5,442 A complete set of results can be found in Results Review.   Recommendations:   Advised  to call physicians office for any changes in condition.     Follow-up plan: ICM clinic 31 day follow up currently on hold but 91 day remote monitoring will continue.     91 day device clinic remote transmission 07/31/2024.     EP/Cardiology Office Visits:    Recall 09/22/2024 with Dr Inocencio.   09/07/2023 with [name], NP   Copy of ICM check sent to Dr. Inocencio.     Remote monitoring is medically necessary for Heart Failure Management.    Daily Thoracic Impedance ICM trend: 04/24/2024 through 07/24/2024.    12-14 Month Thoracic Impedance ICM trend:     Mitzie GORMAN Garner, RN 07/26/2024 3:47 PM

## 2024-07-28 ENCOUNTER — Inpatient Hospital Stay: Attending: Oncology | Admitting: Oncology

## 2024-07-28 DIAGNOSIS — C189 Malignant neoplasm of colon, unspecified: Secondary | ICD-10-CM | POA: Diagnosis not present

## 2024-07-31 ENCOUNTER — Ambulatory Visit

## 2024-08-01 LAB — CUP PACEART REMOTE DEVICE CHECK
Battery Remaining Longevity: 74 mo
Battery Voltage: 2.99 V
Brady Statistic AP VP Percent: 12 %
Brady Statistic AP VS Percent: 0.06 %
Brady Statistic AS VP Percent: 87.76 %
Brady Statistic AS VS Percent: 0.17 %
Brady Statistic RA Percent Paced: 11.74 %
Brady Statistic RV Percent Paced: 96.58 %
Date Time Interrogation Session: 20260202043824
HighPow Impedance: 54 Ohm
Implantable Lead Connection Status: 753985
Implantable Lead Connection Status: 753985
Implantable Lead Connection Status: 753985
Implantable Lead Implant Date: 20231102
Implantable Lead Implant Date: 20231102
Implantable Lead Implant Date: 20231102
Implantable Lead Location: 753858
Implantable Lead Location: 753859
Implantable Lead Location: 753860
Implantable Lead Model: 4598
Implantable Lead Model: 5076
Implantable Lead Model: 6935
Implantable Pulse Generator Implant Date: 20231102
Lead Channel Impedance Value: 152 Ohm
Lead Channel Impedance Value: 152 Ohm
Lead Channel Impedance Value: 152 Ohm
Lead Channel Impedance Value: 156.606
Lead Channel Impedance Value: 156.606
Lead Channel Impedance Value: 247 Ohm
Lead Channel Impedance Value: 304 Ohm
Lead Channel Impedance Value: 304 Ohm
Lead Channel Impedance Value: 304 Ohm
Lead Channel Impedance Value: 323 Ohm
Lead Channel Impedance Value: 323 Ohm
Lead Channel Impedance Value: 399 Ohm
Lead Channel Impedance Value: 437 Ohm
Lead Channel Impedance Value: 494 Ohm
Lead Channel Impedance Value: 513 Ohm
Lead Channel Impedance Value: 513 Ohm
Lead Channel Impedance Value: 513 Ohm
Lead Channel Impedance Value: 513 Ohm
Lead Channel Pacing Threshold Amplitude: 0.5 V
Lead Channel Pacing Threshold Amplitude: 0.875 V
Lead Channel Pacing Threshold Amplitude: 1 V
Lead Channel Pacing Threshold Pulse Width: 0.4 ms
Lead Channel Pacing Threshold Pulse Width: 0.4 ms
Lead Channel Pacing Threshold Pulse Width: 0.4 ms
Lead Channel Sensing Intrinsic Amplitude: 1.75 mV
Lead Channel Sensing Intrinsic Amplitude: 1.75 mV
Lead Channel Sensing Intrinsic Amplitude: 7.25 mV
Lead Channel Sensing Intrinsic Amplitude: 7.25 mV
Lead Channel Setting Pacing Amplitude: 1.5 V
Lead Channel Setting Pacing Amplitude: 2 V
Lead Channel Setting Pacing Amplitude: 2 V
Lead Channel Setting Pacing Pulse Width: 0.4 ms
Lead Channel Setting Pacing Pulse Width: 0.4 ms
Lead Channel Setting Sensing Sensitivity: 0.3 mV
Zone Setting Status: 755011

## 2024-09-06 ENCOUNTER — Ambulatory Visit: Admitting: Cardiology

## 2024-10-30 ENCOUNTER — Ambulatory Visit

## 2025-01-18 ENCOUNTER — Inpatient Hospital Stay

## 2025-01-18 ENCOUNTER — Other Ambulatory Visit (HOSPITAL_COMMUNITY)

## 2025-01-25 ENCOUNTER — Inpatient Hospital Stay: Admitting: Oncology

## 2025-01-29 ENCOUNTER — Ambulatory Visit

## 2025-04-30 ENCOUNTER — Ambulatory Visit

## 2025-07-30 ENCOUNTER — Ambulatory Visit
